# Patient Record
Sex: Female | Born: 1945 | Race: White | Hispanic: No | Marital: Married | Smoking: Never smoker
Health system: Southern US, Community
[De-identification: ages and names within clinical notes are randomized; demographics above are authoritative.]

## PROBLEM LIST (undated history)

## (undated) DIAGNOSIS — R945 Abnormal results of liver function studies: Principal | ICD-10-CM

## (undated) DIAGNOSIS — F329 Major depressive disorder, single episode, unspecified: Secondary | ICD-10-CM

## (undated) DIAGNOSIS — N200 Calculus of kidney: Secondary | ICD-10-CM

## (undated) DIAGNOSIS — M549 Dorsalgia, unspecified: Secondary | ICD-10-CM

## (undated) DIAGNOSIS — K219 Gastro-esophageal reflux disease without esophagitis: Secondary | ICD-10-CM

## (undated) DIAGNOSIS — E785 Hyperlipidemia, unspecified: Secondary | ICD-10-CM

## (undated) DIAGNOSIS — C9110 Chronic lymphocytic leukemia of B-cell type not having achieved remission: Secondary | ICD-10-CM

## (undated) DIAGNOSIS — F419 Anxiety disorder, unspecified: Secondary | ICD-10-CM

## (undated) DIAGNOSIS — G8929 Other chronic pain: Secondary | ICD-10-CM

## (undated) DIAGNOSIS — Z87898 Personal history of other specified conditions: Secondary | ICD-10-CM

## (undated) DIAGNOSIS — M797 Fibromyalgia: Secondary | ICD-10-CM

## (undated) DIAGNOSIS — R7989 Other specified abnormal findings of blood chemistry: Secondary | ICD-10-CM

## (undated) DIAGNOSIS — J189 Pneumonia, unspecified organism: Secondary | ICD-10-CM

## (undated) DIAGNOSIS — L659 Nonscarring hair loss, unspecified: Secondary | ICD-10-CM

## (undated) DIAGNOSIS — F32A Depression, unspecified: Secondary | ICD-10-CM

## (undated) DIAGNOSIS — M1990 Unspecified osteoarthritis, unspecified site: Secondary | ICD-10-CM

## (undated) DIAGNOSIS — H9191 Unspecified hearing loss, right ear: Secondary | ICD-10-CM

## (undated) DIAGNOSIS — C4490 Unspecified malignant neoplasm of skin, unspecified: Secondary | ICD-10-CM

## (undated) DIAGNOSIS — R000 Tachycardia, unspecified: Secondary | ICD-10-CM

## (undated) DIAGNOSIS — R319 Hematuria, unspecified: Secondary | ICD-10-CM

## (undated) DIAGNOSIS — R159 Full incontinence of feces: Secondary | ICD-10-CM

## (undated) DIAGNOSIS — M722 Plantar fascial fibromatosis: Secondary | ICD-10-CM

## (undated) DIAGNOSIS — K5790 Diverticulosis of intestine, part unspecified, without perforation or abscess without bleeding: Secondary | ICD-10-CM

## (undated) DIAGNOSIS — C50919 Malignant neoplasm of unspecified site of unspecified female breast: Secondary | ICD-10-CM

## (undated) DIAGNOSIS — Z9221 Personal history of antineoplastic chemotherapy: Secondary | ICD-10-CM

## (undated) DIAGNOSIS — M519 Unspecified thoracic, thoracolumbar and lumbosacral intervertebral disc disorder: Secondary | ICD-10-CM

## (undated) DIAGNOSIS — IMO0002 Reserved for concepts with insufficient information to code with codable children: Secondary | ICD-10-CM

## (undated) DIAGNOSIS — E039 Hypothyroidism, unspecified: Secondary | ICD-10-CM

## (undated) HISTORY — DX: Major depressive disorder, single episode, unspecified: F32.9

## (undated) HISTORY — DX: Fibromyalgia: M79.7

## (undated) HISTORY — PX: TONSILLECTOMY: SUR1361

## (undated) HISTORY — DX: Nonscarring hair loss, unspecified: L65.9

## (undated) HISTORY — DX: Hypothyroidism, unspecified: E03.9

## (undated) HISTORY — DX: Reserved for concepts with insufficient information to code with codable children: IMO0002

## (undated) HISTORY — DX: Hyperlipidemia, unspecified: E78.5

## (undated) HISTORY — DX: Abnormal results of liver function studies: R94.5

## (undated) HISTORY — DX: Plantar fascial fibromatosis: M72.2

## (undated) HISTORY — DX: Malignant neoplasm of unspecified site of unspecified female breast: C50.919

## (undated) HISTORY — DX: Tachycardia, unspecified: R00.0

## (undated) HISTORY — DX: Anxiety disorder, unspecified: F41.9

## (undated) HISTORY — DX: Other specified abnormal findings of blood chemistry: R79.89

## (undated) HISTORY — PX: LUMBAR EPIDURAL INJECTION: SHX1980

## (undated) HISTORY — DX: Unspecified osteoarthritis, unspecified site: M19.90

## (undated) HISTORY — DX: Diverticulosis of intestine, part unspecified, without perforation or abscess without bleeding: K57.90

## (undated) HISTORY — DX: Unspecified thoracic, thoracolumbar and lumbosacral intervertebral disc disorder: M51.9

## (undated) HISTORY — DX: Depression, unspecified: F32.A

---

## 1976-08-25 DIAGNOSIS — M797 Fibromyalgia: Secondary | ICD-10-CM

## 1976-08-25 HISTORY — DX: Fibromyalgia: M79.7

## 1995-08-26 HISTORY — PX: BREAST LUMPECTOMY: SHX2

## 1995-08-26 HISTORY — PX: APPENDECTOMY: SHX54

## 1995-08-26 HISTORY — PX: BREAST LUMPECTOMY WITH AXILLARY LYMPH NODE DISSECTION: SHX5756

## 1995-08-26 HISTORY — PX: TOTAL ABDOMINAL HYSTERECTOMY W/ BILATERAL SALPINGOOPHORECTOMY: SHX83

## 1997-12-07 ENCOUNTER — Encounter: Admission: RE | Admit: 1997-12-07 | Discharge: 1998-03-07 | Payer: Self-pay | Admitting: Oncology

## 2000-02-05 ENCOUNTER — Other Ambulatory Visit: Admission: RE | Admit: 2000-02-05 | Discharge: 2000-02-05 | Payer: Self-pay | Admitting: *Deleted

## 2001-10-14 ENCOUNTER — Encounter (INDEPENDENT_AMBULATORY_CARE_PROVIDER_SITE_OTHER): Payer: Self-pay | Admitting: Specialist

## 2001-10-14 ENCOUNTER — Ambulatory Visit (HOSPITAL_COMMUNITY): Admission: RE | Admit: 2001-10-14 | Discharge: 2001-10-14 | Payer: Self-pay | Admitting: *Deleted

## 2001-10-26 ENCOUNTER — Encounter (HOSPITAL_COMMUNITY): Admission: RE | Admit: 2001-10-26 | Discharge: 2001-11-25 | Payer: Self-pay | Admitting: Oncology

## 2001-10-26 ENCOUNTER — Encounter: Admission: RE | Admit: 2001-10-26 | Discharge: 2001-10-26 | Payer: Self-pay | Admitting: Oncology

## 2002-04-25 ENCOUNTER — Emergency Department (HOSPITAL_COMMUNITY): Admission: EM | Admit: 2002-04-25 | Discharge: 2002-04-25 | Payer: Self-pay | Admitting: Emergency Medicine

## 2002-04-25 ENCOUNTER — Encounter: Payer: Self-pay | Admitting: Emergency Medicine

## 2003-05-29 ENCOUNTER — Encounter (HOSPITAL_COMMUNITY): Admission: RE | Admit: 2003-05-29 | Discharge: 2003-06-28 | Payer: Self-pay | Admitting: Oncology

## 2003-05-29 ENCOUNTER — Encounter: Admission: RE | Admit: 2003-05-29 | Discharge: 2003-05-29 | Payer: Self-pay | Admitting: Oncology

## 2004-06-11 ENCOUNTER — Encounter (HOSPITAL_COMMUNITY): Admission: RE | Admit: 2004-06-11 | Discharge: 2004-07-11 | Payer: Self-pay | Admitting: Oncology

## 2004-06-11 ENCOUNTER — Encounter: Admission: RE | Admit: 2004-06-11 | Discharge: 2004-06-11 | Payer: Self-pay | Admitting: Oncology

## 2005-06-11 ENCOUNTER — Ambulatory Visit (HOSPITAL_COMMUNITY): Payer: Self-pay | Admitting: Oncology

## 2005-06-11 ENCOUNTER — Encounter: Admission: RE | Admit: 2005-06-11 | Discharge: 2005-06-11 | Payer: Self-pay | Admitting: Oncology

## 2005-06-11 ENCOUNTER — Encounter (HOSPITAL_COMMUNITY): Admission: RE | Admit: 2005-06-11 | Discharge: 2005-07-11 | Payer: Self-pay | Admitting: Oncology

## 2005-09-04 ENCOUNTER — Ambulatory Visit (HOSPITAL_COMMUNITY): Admission: RE | Admit: 2005-09-04 | Discharge: 2005-09-04 | Payer: Self-pay | Admitting: Family Medicine

## 2005-09-04 ENCOUNTER — Encounter (INDEPENDENT_AMBULATORY_CARE_PROVIDER_SITE_OTHER): Payer: Self-pay | Admitting: Cardiology

## 2005-09-23 ENCOUNTER — Ambulatory Visit (HOSPITAL_COMMUNITY): Admission: RE | Admit: 2005-09-23 | Discharge: 2005-09-23 | Payer: Self-pay | Admitting: Family Medicine

## 2006-06-04 ENCOUNTER — Ambulatory Visit (HOSPITAL_COMMUNITY): Admission: RE | Admit: 2006-06-04 | Discharge: 2006-06-04 | Payer: Self-pay | Admitting: *Deleted

## 2006-06-04 ENCOUNTER — Encounter (INDEPENDENT_AMBULATORY_CARE_PROVIDER_SITE_OTHER): Payer: Self-pay | Admitting: Specialist

## 2006-08-28 ENCOUNTER — Encounter (HOSPITAL_COMMUNITY): Admission: RE | Admit: 2006-08-28 | Discharge: 2006-09-27 | Payer: Self-pay | Admitting: Oncology

## 2006-08-28 ENCOUNTER — Ambulatory Visit (HOSPITAL_COMMUNITY): Payer: Self-pay | Admitting: Oncology

## 2007-04-29 ENCOUNTER — Ambulatory Visit: Payer: Self-pay | Admitting: Cardiology

## 2007-05-11 ENCOUNTER — Ambulatory Visit: Payer: Self-pay

## 2007-05-11 ENCOUNTER — Encounter: Payer: Self-pay | Admitting: Cardiology

## 2007-06-17 ENCOUNTER — Ambulatory Visit: Payer: Self-pay | Admitting: Cardiology

## 2007-08-03 ENCOUNTER — Ambulatory Visit: Payer: Self-pay | Admitting: Cardiology

## 2007-08-31 ENCOUNTER — Ambulatory Visit: Payer: Self-pay | Admitting: Internal Medicine

## 2007-08-31 ENCOUNTER — Ambulatory Visit (HOSPITAL_COMMUNITY): Admission: RE | Admit: 2007-08-31 | Discharge: 2007-08-31 | Payer: Self-pay | Admitting: Cardiology

## 2007-09-16 ENCOUNTER — Ambulatory Visit: Payer: Self-pay | Admitting: Cardiology

## 2007-09-22 ENCOUNTER — Ambulatory Visit (HOSPITAL_COMMUNITY): Payer: Self-pay | Admitting: Oncology

## 2007-09-22 ENCOUNTER — Encounter (HOSPITAL_COMMUNITY): Admission: RE | Admit: 2007-09-22 | Discharge: 2007-10-22 | Payer: Self-pay | Admitting: Oncology

## 2007-12-23 ENCOUNTER — Encounter (HOSPITAL_COMMUNITY): Admission: RE | Admit: 2007-12-23 | Discharge: 2008-01-22 | Payer: Self-pay | Admitting: Oncology

## 2007-12-23 ENCOUNTER — Ambulatory Visit (HOSPITAL_COMMUNITY): Payer: Self-pay | Admitting: Oncology

## 2008-01-13 ENCOUNTER — Encounter: Admission: RE | Admit: 2008-01-13 | Discharge: 2008-01-13 | Payer: Self-pay | Admitting: *Deleted

## 2008-09-20 ENCOUNTER — Ambulatory Visit (HOSPITAL_COMMUNITY): Payer: Self-pay | Admitting: Oncology

## 2008-10-19 ENCOUNTER — Encounter: Admission: RE | Admit: 2008-10-19 | Discharge: 2008-10-19 | Payer: Self-pay | Admitting: *Deleted

## 2009-10-15 ENCOUNTER — Ambulatory Visit (HOSPITAL_COMMUNITY): Payer: Self-pay | Admitting: Oncology

## 2009-10-15 ENCOUNTER — Encounter (HOSPITAL_COMMUNITY): Admission: RE | Admit: 2009-10-15 | Discharge: 2009-11-14 | Payer: Self-pay | Admitting: Oncology

## 2010-03-06 ENCOUNTER — Ambulatory Visit: Payer: Self-pay | Admitting: Cardiology

## 2010-03-19 ENCOUNTER — Encounter: Admission: RE | Admit: 2010-03-19 | Discharge: 2010-03-19 | Payer: Self-pay | Admitting: Neurology

## 2010-04-02 ENCOUNTER — Ambulatory Visit: Payer: Self-pay | Admitting: Psychology

## 2010-06-05 ENCOUNTER — Ambulatory Visit: Payer: Self-pay | Admitting: Psychology

## 2010-09-24 NOTE — Assessment & Plan Note (Signed)
Summary: F2Y  Medications Added PRISTIQ 100 MG XR24H-TAB (DESVENLAFAXINE SUCCINATE) once daily WELLBUTRIN 100 MG TABS (BUPROPION HCL) once daily AMITRIPTYLINE HCL 150 MG TABS (AMITRIPTYLINE HCL) at bedtime OMEPRAZOLE 20 MG CPDR (OMEPRAZOLE) once daily CALCIUM CARBONATE-VITAMIN D 600-400 MG-UNIT  TABS (CALCIUM CARBONATE-VITAMIN D) 2 tabs once daily      Allergies Added: ! LYRICA  Visit Type:  Follow-up Primary Provider:  Burgess Estelle, MD  CC:  chest pain.  History of Present Illness: patient is seen for followup her cardiac status.  She's had some orthostatic dizziness in the past.  This is stable.  She's also had some chest pain.  She's not had any significant recurrence.  There was an episode of syncope in 2008.  There's been no recurrence.  She had shortness of breath.  Ultimately she had a cardiopulmonary exercise test in January, 2009.  It was felt that her overall sensation of dyspnea was probably due to obesity and deconditioning.  There were no definite cardiopulmonary limitations.  Weight loss will be very important.  She didn't lose weight last summer but unfortunately she has gained most of it back.  Current Medications (verified): 1)  Metoprolol Succinate 25 Mg Xr24h-Tab (Metoprolol Succinate) .... Take One Tablet By Mouth Daily 2)  Pristiq 100 Mg Xr24h-Tab (Desvenlafaxine Succinate) .... Once Daily 3)  Wellbutrin 100 Mg Tabs (Bupropion Hcl) .... Once Daily 4)  Amitriptyline Hcl 150 Mg Tabs (Amitriptyline Hcl) .... At Bedtime 5)  Omeprazole 20 Mg Cpdr (Omeprazole) .... Once Daily 6)  Calcium Carbonate-Vitamin D 600-400 Mg-Unit  Tabs (Calcium Carbonate-Vitamin D) .... 2 Tabs Once Daily  Allergies (verified): 1)  ! Lyrica  Past History:  Past Medical History: SOB.....evaluation included CPX.... shortness of breath from obesity and deconditioning 2009 Syncope.... 2008... no recurrence Breast cancer.... status post lumpectomies Sinus tachycardia... mild resting EF  65% Impression Chest Pain...no proven coronary disease Hight Triglycirdes Orthostatic hypotension.... slight in the past  S/P  axillary node dissection S/P  Abdominal Hysterectomy-Total S/P  Lupectomies  Review of Systems       Patient denies fever, chills, headache, sweats, rash, change in vision, change in hearing, chest pain, cough, nausea vomiting, urinary symptoms.  All of the systems are reviewed and are negative.  Vital Signs:  Patient profile:   65 year old female Height:      64 inches Weight:      188 pounds BMI:     32.39 Pulse rate:   92 / minute BP sitting:   104 / 64  (left arm) Cuff size:   regular  Vitals Entered By: Hardin Negus, RMA (March 06, 2010 2:51 PM)  Physical Exam  General:  patient is stable. Eyes:  no xanthelasma. Neck:  no jugular venous distention. Lungs:  lungs are clear.  Respiratory effort is nonlabored. Heart:    Cardiac exam reveals S1-S2.  No clicks or significant murmurs. Abdomen:  abdomen is soft. Extremities:  no peripheral edema. Psych:  patient is oriented to person time and place affect is normal.   Impression & Recommendations:  Problem # 1:  DYSPNEA (ICD-786.05)  Her updated medication list for this problem includes:    Metoprolol Succinate 25 Mg Xr24h-tab (Metoprolol succinate) .Marland Kitchen... Take one tablet by mouth daily The patient has chronic dyspnea.  It is stable.  We know that she needs to exercise and lose weight .  Problem # 2:  CHEST PAIN-UNSPECIFIED (ICD-786.50)  Her updated medication list for this problem includes:    Metoprolol Succinate 25 Mg  Xr24h-tab (Metoprolol succinate) .Marland Kitchen... Take one tablet by mouth daily She has not had significant chest pain.  EKG is done today.  There no significant changes.  Resting heart rate is 92.  No changes.  Problem # 3:  ORTHOSTATIC HYPOTENSION (ICD-458.0) She's not had any orthostatic hypotension.  Patient Instructions: 1)  Your physician wants you to follow-up in:  2 years.   You will receive a reminder letter in the mail two months in advance. If you don't receive a letter, please call our office to schedule the follow-up appointment.

## 2010-10-14 ENCOUNTER — Ambulatory Visit (HOSPITAL_COMMUNITY): Payer: Self-pay | Admitting: Oncology

## 2010-10-16 ENCOUNTER — Ambulatory Visit (HOSPITAL_BASED_OUTPATIENT_CLINIC_OR_DEPARTMENT_OTHER): Payer: Medicare Other | Admitting: Oncology

## 2010-10-16 ENCOUNTER — Encounter (HOSPITAL_COMMUNITY): Payer: Medicare Other | Attending: Oncology

## 2010-10-16 DIAGNOSIS — F3289 Other specified depressive episodes: Secondary | ICD-10-CM | POA: Insufficient documentation

## 2010-10-16 DIAGNOSIS — Z09 Encounter for follow-up examination after completed treatment for conditions other than malignant neoplasm: Secondary | ICD-10-CM | POA: Insufficient documentation

## 2010-10-16 DIAGNOSIS — C50919 Malignant neoplasm of unspecified site of unspecified female breast: Secondary | ICD-10-CM

## 2010-10-16 DIAGNOSIS — F329 Major depressive disorder, single episode, unspecified: Secondary | ICD-10-CM | POA: Insufficient documentation

## 2010-10-16 DIAGNOSIS — Z853 Personal history of malignant neoplasm of breast: Secondary | ICD-10-CM | POA: Insufficient documentation

## 2010-11-13 LAB — DIFFERENTIAL
Basophils Absolute: 0 10*3/uL (ref 0.0–0.1)
Lymphocytes Relative: 43 % (ref 12–46)
Lymphs Abs: 5.9 10*3/uL — ABNORMAL HIGH (ref 0.7–4.0)
Neutro Abs: 6.8 10*3/uL (ref 1.7–7.7)
Neutrophils Relative %: 49 % (ref 43–77)

## 2010-11-13 LAB — CBC
Platelets: 220 10*3/uL (ref 150–400)
RDW: 13.3 % (ref 11.5–15.5)
WBC: 13.8 10*3/uL — ABNORMAL HIGH (ref 4.0–10.5)

## 2011-01-07 NOTE — Assessment & Plan Note (Signed)
[facility]                            CARDIOLOGY OFFICE NOTE   NAME:Vanleer, ASHAYLA SUBIA                     MRN:          [id number]  DATE:06/17/2007                            DOB:          December 09, 1945    Ms. Delbene is doing well.  I had seen her on April 29, 2007.  At  that time we did a 2D echo.  The ejection fraction was 65% and there  were no significant abnormalities.  We did do orthostatic blood  pressures.  She had a mild drop from 130 systolic lying to 112 standing.  She had no significant dizziness during this period.  She also wore and  event recorder.  She did have sinus tachycardia.  Her rates would range  at various times from 100 to as high as 130.  She tends to have a high  resting heart rate overall.  Today, in the office she is feeling well.  She feels warm all of the  time.  Otherwise, she is stable.   PAST MEDICAL HISTORY:   ALLERGIES:  LYRICA.   MEDICATIONS:  Cymbalta, Wellbutrin, amitriptyline, and vitamins.   OTHER MEDICAL PROBLEMS:  See the list on my note of April 29, 2007.   REVIEW OF SYSTEMS:  See the HPI.   PHYSICAL EXAMINATION:  NEUROLOGIC:  The patient is oriented to person,  time, and place.  Affect is normal.  She is fanning herself in the room  today.  It may be slightly on the warm side but not particularly warm at  the time.  HEENT:  Reveals no xanthelasma. She has normal extraocular motion.  NECK:  There are no carotid bruits.  There is no jugular venous  distention.  LUNGS:  Clear.  Respiratory effort is not labored.  CARDIAC:  Reveals an S1 with an S2.  There are no clicks or significant  murmurs.  ABDOMEN:  Soft.  There are no masses or bruits.  EXTREMITIES:  She has no peripheral edema.   No labs are done today.  See the discussion in the HPI.   PROBLEMS:  Are listed in my note of April 29, 2007.  #1.  History of some orthostatic dizziness by history.  She does have  mild orthostasis at  this time.  I will not make any changes in any of  her medications.  #6.  Syncopal episode in the past 2 weeks.  No other workup at this  time.  #11.  Slight increase in resting heart rate.  I believe that a low dose  of beta-blockade may help her.  We have started 25 mg extended release  metoprolol and we will see her back for followup.     Luis Abed, MD, [facility]  Electronically Signed    JDK/MedQ  DD: 06/17/2007  DT: 06/18/2007  Job #: [id number]   cc:   Broadus John T. Pamalee Leyden, MD

## 2011-01-07 NOTE — Assessment & Plan Note (Signed)
[facility]                            CARDIOLOGY OFFICE NOTE   NAME:[name], [name] LATHON                     MRN:          [id number]  DATE:04/29/2007                            DOB:          06/17/1946    [name] [name] is self-referred for the evaluation of chest pain and  syncope.  She is followed at [facility].  Recently,  over the weekend, she was seen at Urgent Care on [address].  I take  care of the patient's husband and she is here for cardiac evaluation.  When she was seen at the urgent care center her EKG revealed sinus  arrhythmia and she is following up with cardiac evaluation.  It turns  out also that in the past week or two the patient had a spell of  syncope.  She stood up at home and passed out.  She did bump her head.  Exact etiology is not clear.  She is having significant symptoms of  menopause.  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  She has had a hysterectomy and cannot take hormone replacement.  She is  using a hand fan in the room at this time and is very bothered by her  symptoms of intermittent sweats.   She also has had some chest tightness and some shortness of breath.  They do not sound like classic anginal symptoms.   The patient also mentioned some mild chest discomfort in her left chest.  It actually occurs at night.  There is no radiation.  There is no  nausea, vomiting or diaphoresis.  It only lasts for a few moments.   PAST MEDICAL HISTORY:  Allergies:  LYRICA.   Medications:  Cymbalta, Wellbutrin, amitriptyline, calcium, magnesium  and Prilosec OTC.   Other medical problems:  See list below.   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  She  does not smoke.   FAMILY HISTORY:  There is no strong family history of coronary disease.   REVIEW OF SYSTEMS:  She has some seasonal allergies, constipation and  fatigue.  She also has GERD and arthritic  pains.  She is treated for  anxiety and depression.  She does have a Veterinary surgeon.  Otherwise, her  review of systems is negative.   PHYSICAL EXAMINATION:  Her weight is 189 pounds.  Blood pressure sitting  is 134/76 with a pulse of 95.  Orthostatic blood pressures are being  done at this time and will be re-reviewed.  The patient is oriented to  person, time and place.  Affect is normal.  She is somewhat  uncomfortable with a hot flash at this time.  HEENT:  Reveals no xanthelasma.  She has normal extraocular motion.  There are no carotid bruits.  There is no jugular venous distention.  CARDIAC:  Reveals an S1 with an S2.  There are no clicks or significant  murmurs.  LUNGS:  Clear.  There is no respiratory distress.  ABDOMEN:  Soft.  There are no masses or bruits.  She has normal bowel  sounds.  There  is no peripheral edema.  She has 2+ distal pulses.   EKG reveals that she has sinus rhythm and a normal EKG.   Labs that had been done showed that the patient has a normal TSH dated  June 2008.  She has elevated triglyceride.   PROBLEMS:  1. History of some orthostatic dizziness by history.  Formal      orthostatic pressures are being checked at this time.  2. Significant symptoms from menopause.  3. History of allergy to [facility].  4. Left chest pain.  At this point I am not convinced this represents      ischemia.  5. Mild intermittent shortness of breath.  6. Syncopal episode in the past 2 weeks.  Exact etiology is not clear.      She will wear an event recorder.  7. Elevated triglycerides by history.  8. History of breast cancer in the past.  9. Status post lumpectomies and an axillary node dissection.  10.Status post total abdominal hysterectomy.   The patient will have a 2-D echocardiogram and she will wear an event  recorder, and I will then see her back for followup.     Luis Abed, MD, [facility]  Electronically Signed    JDK/MedQ  DD: 04/29/2007  DT: 04/29/2007  Job  #: [id number]   cc:   Broadus John T. Pamalee Leyden, MD

## 2011-01-07 NOTE — Assessment & Plan Note (Signed)
[facility]                            CARDIOLOGY OFFICE NOTE   NAME:[name], [name] ISTRE                     MRN:          [id number]  DATE:09/16/2007                            DOB:          05-19-1946    [name] [name] is seen for cardiology follow-up.  See my complete note of  August 03, 2007.  At that time we decided that we would proceed with a  cardiopulmonary exercise test to assess her overall capacity.  This was  done with a very extensive evaluation on August 31, 2007.  The report is  finalized by Dr. Gala Romney.  She has normal functional capacity when  compared to matched sedentary norms.  There is a ventilatory limitation  at peak exercise that is compatible with her body habitus.  She does  have an elevated heart rate response at low level of exercise that is  likely related to deconditioning.  Her perceived dyspnea in general is  likely due to her obesity and deconditioning.  There are no definite  cardiopulmonary limitations.  She is strongly encouraged to begin a  weight loss and exercise program.   Today in the office I explained all of the information to her.  She  seems pleased.  She does want to proceed with an exercise diet program.  She is not having any marked chest pain.   ALLERGIES:  LYRICA.   MEDICATIONS:  Cymbalta, Wellbutrin, amitriptyline, vitamins, Prilosec,  and metoprolol ER 25 mg daily.   OTHER MEDICAL PROBLEMS:  See the list on the note of August 03, 2007.   REVIEW OF SYSTEMS:  Overall she is doing well.  She has some mild chest  and neck discomfort.  I believe that this is not ischemic in origin.  Otherwise, Review of Systems is negative.   PHYSICAL EXAMINATION:  VITAL SIGNS:  Weight is up to 210 pounds, blood  pressure 118/80 with pulse 87.  GENERAL:  The patient is oriented to person, time, and place.  Affect is  normal.  HEENT:  Reveals no xanthelasma.  She has normal extraocular motion.  NECK:  There are no  carotid bruits.  There is no jugular venous  distention.  LUNGS:  Clear.  Respiratory effort is not labored.  CARDIAC:  Reveals S1 and S2.  There are no clicks or significant  murmurs.  ABDOMEN:  Soft.  She has no peripheral edema.   LABORATORY DATA:  No labs are done today.  See the complete  cardiopulmonary exercise test dated August 31, 2007.   IMPRESSION:  Problems are listed on the note of August 03, 2007.  #5. Intermittent shortness of breath.  See the results of the CPX.  I  believe this is related to her body habitus and deconditioning.  She is  stable and encouraged to exercise.  #10. Mild resting tachycardia that is better with beta blockade.  I  would keep her on a low dose of beta blocker.   No further cardiac workup is needed at this time.     Luis Abed, MD, [facility]  Electronically Signed    JDK/MedQ  DD: 09/16/2007  DT: 09/16/2007  Job #: [id number]   cc:   Broadus John T. Pamalee Leyden, MD

## 2011-01-07 NOTE — Assessment & Plan Note (Signed)
[facility]                            CARDIOLOGY OFFICE NOTE   NAME:Boddy, BRYNLI OLLIS                     MRN:          [id number]  DATE:08/03/2007                            DOB:          10/12/1945    [name] [name] is seen for a cardiology followup.  I saw her last on  June 17, 2007.  At that time, she was doing well.  I had seen her in  September of 2008.  She had a 2D echocardiogram.  Her ejection fraction  was normal.  She had a mild orthostatic blood pressure drop.  She wore  an event recorder and had some increased resting heart rate.  On June 17, 2007 I added low-dose metoprolol.  She has not had any recurring  chest discomfort and she has not had any recurring syncope.  However,  the patient today says that she has significant exertional shortness of  breath.  Today, she tells me that in fact this has been going on for a  prolonged period of time.  She does not have PND or orthopnea.  She does  not have any pedal edema.   PAST MEDICAL HISTORY:  Allergies:  Lyrica.  Medications:  Cymbalta, Wellbutrin, amitriptyline, Prilosec, Abilify,  and Metoprolol ER 25 daily.  Other medical problems:  See the list below.   REVIEW OF SYSTEMS:  Today she mentioned that she has some back pain.  She also has restlessness.  Otherwise her review of systems is negative.   PHYSICAL EXAMINATION:  Weight is 205 pounds.  Her weight is increasing.  She is encouraged by me to try to begin to lose weight.  Blood pressure  is 137/82 and her pulse is 90.  The patient is oriented to person, time  and place.  Aspect reveals that she does appear somewhat worried about  her overall status.  HEENT:  No xanthelasmas.  She has normal extraocular motion.  NECK:  There are no carotid bruits.  There is no jugular venous  distention.  LUNGS:  Clear.  Respiratory effort is not labored.  CARDIAC:  Reveals an S1, S2.  There are no clicks or significant  murmurs.  ABDOMEN:   Soft.  She has no peripheral edema.   PROBLEMS INCLUDE:  1. History of orthostatic dizziness by history and slight orthostatic      change when seen earlier this year but no change needs to be made      in her medicines.  2. Symptoms from menopause.  3. History of allergies to [facility].  4. Slight chest pain.  She has not had a recurrence of this and I      doubt ischemia.  5. Intermittent shortness of breath.  This appears to be a bigger      problem than I noted in the past.  We talked at length about the      possibility of various types of exercise tests.  I believe that a      cardiopulmonary exercise (CPX) will be the best to see if we cannot      get a  handle on what her limitations are.  It is possible that she      may have to pedal the bicycle.  It will have to be determined at      the time of the study how well she can walk.  6. History of a syncopal episode is August of 2008 with no recurrence.  7. History of elevated triglycerides.  8. History of breast cancer in the past with status post lumpectomies      and axillary node dissection.  9. Status post total abdominal hysterectomy.  10.Mild resting sinus tachycardia which is better with beta blockade.  11.Normal LV function with an ejection fraction of 65%.  12.Depression with medications on board.   We will arrange for a CPX and then I will see her in followup.     Luis Abed, MD, [facility]  Electronically Signed    JDK/MedQ  DD: 08/03/2007  DT: 08/03/2007  Job #: [id number]   cc:   Broadus John T. Pamalee Leyden, MD

## 2011-01-10 NOTE — Op Note (Signed)
NAME:  [name] [name], [name] [name] NO.:  [id number]   MEDICAL RECORD NO.:  [id number]          PATIENT TYPE:  AMB   LOCATION:  ENDO                         FACILITY:  [facility]   PHYSICIAN:  Georgiana Spinner, M.D.    DATE OF BIRTH:  Jan 27, 1946   DATE OF PROCEDURE:  DATE OF DISCHARGE:                                 OPERATIVE REPORT   PROCEDURE:  Upper endoscopy.   INDICATIONS:  GERD.   ANESTHESIA:  Demerol 60, Versed 6 mg.   PROCEDURE:  With the patient mildly sedated in the left lateral decubitus  position, the Olympus videoscopic endoscope was inserted and passed under  direct vision through the esophagus which appeared normal.  There was no  evidence of Barrett's esophagus.  We entered into the stomach fundus, body,  antrum, duodenal bulb, second portion of duodenum were visualized.  From  this point the endoscope was slowly withdrawn taking circumferential views  of the duodenal mucosa until the endoscope had been pulled back into the  stomach, placed in retroflexion to view the stomach from below.  The  endoscope was straightened and withdrawn taking circumferential views of the  remaining gastric and esophageal mucosa.  The patient's vital signs and  pulse oximeter remained stable.  The patient tolerated the procedure well  without apparent complications.   FINDINGS:  Rather unremarkable examination.   PLAN:  Proceed to colonoscopy.           ______________________________  Georgiana Spinner, M.D.     GMO/MEDQ  D:  06/04/2006  T:  06/05/2006  Job:  [id number]

## 2011-01-10 NOTE — Op Note (Signed)
NAME:  [name] [name], [name] [name] NO.:  [id number]   MEDICAL RECORD NO.:  [id number]          PATIENT TYPE:  AMB   LOCATION:  ENDO                         FACILITY:  [facility]   PHYSICIAN:  Georgiana Spinner, M.D.    DATE OF BIRTH:  Aug 28, 1945   DATE OF PROCEDURE:  DATE OF DISCHARGE:                                 OPERATIVE REPORT   PROCEDURE:  Colonoscopy.   INDICATIONS:  Diarrhea, rectal bleeding.   ANESTHESIA:  Demerol 20, Versed 2 mg.   PROCEDURE:  With the patient in the left lateral decubitus position before  sedation was given, a rectal examination was performed and she had  moderately decreased rectal tone.  Subsequently, the Olympus videoscopic  colonoscope was inserted into the rectum, passed under direct vision to the  cecum identified by ileocecal valve and appendiceal orifice.  From this  point the colonoscope was slowly withdrawn taking circumferential views of  the colonic mucosa, stopping to take random biopsies from normal-appearing  mucosa as we withdrew all the way to the rectum, which appeared normal on  direct and retroflexed view.  The endoscope was straightened and withdrawn.  The patient's vital signs and pulse oximeter remained stable.  The patient  tolerated the procedure well without apparent complication.   FINDINGS:  Unremarkable examination with biopsies taken.   PLAN:  Await biopsy report.  The patient will call me for results and follow  up with me as an outpatient.           ______________________________  Georgiana Spinner, M.D.     GMO/MEDQ  D:  06/04/2006  T:  06/05/2006  Job:  [id number]

## 2011-01-10 NOTE — Procedures (Signed)
[facility]  Patient:    [name] [name], [name] [name] Visit Number: [id number] MRN: [id number]          Service Type: Attending:  Sabino Gasser, M.D. Dictated by:   Sabino Gasser, M.D. Proc. Date: 10/14/01                             Procedure Report  PROCEDURE:  Upper endoscopy.  INDICATIONS:  GERD.  ANESTHESIA:  Demerol 80 mg, Versed 8 mg.  DESCRIPTION OF PROCEDURE:  With the patient mildly sedated in the left lateral decubitus position, the Olympus videoscopic endoscope was inserted in the mouth and passed under direct vision through the esophagus. The distal esophagus was approached and showed changes of esophagitis with an ulcer. The tissue was fairly friable and it bled with just passage of the scope. We photographed and biopsied this area. We entered into the stomach. Fundus, body, antrum, duodenal bulb, and second portion of the duodenum all appeared normal. From this point, the endoscope was slowly withdrawn taking circumferential views of the entire duodenal mucosa until the endoscope then pulled back into the stomach, placed in retroflexion to view the stomach from below. The endoscope was then straightened and withdrawn taking circumferential views of the remaining gastric and esophageal mucosa. The patients vital signs and pulse oximeter remained stable. The patient tolerated the procedure well without apparent complications.  FINDINGS:  Changes of esophagitis, biopsied.  PLAN:  Will place the patient on proton pump inhibitor therapy, if she is not already on it. Proceed to colonoscopy as planned. Dictated by:   Sabino Gasser, M.D. Attending:  Sabino Gasser, M.D. DD:  10/14/01 TD:  10/14/01 Job: [id number]

## 2011-01-10 NOTE — Procedures (Signed)
[facility]  Patient:    [name] [name], [name] [name] Visit Number: [id number] MRN: [id number]          Service Type: Attending:  Sabino Gasser, M.D. Dictated by:   Sabino Gasser, M.D. Proc. Date: 10/14/01                             Procedure Report  PROCEDURE:  Colonoscopy.  INDICATIONS:  Colon cancer screening.  ANESTHESIA:  Demerol 20 mg, Versed 2 mg.  DESCRIPTION OF PROCEDURE:  With the patient mildly sedated in the left lateral decubitus position, the Olympus videoscopic colonoscope was inserted in the rectum and passed under direct vision with pressure applied to the abdomen to the cecum, identified by the ileocecal valve and appendiceal orifice. The prep was slightly suboptimal in that there was liquid fecal material scattered throughout the colon, especially in the right colon. After clearing the cecum with washing and suctioning, we entered into the terminal ileum which was normal appearing. It, too, was full of prep and fecal material. The endoscope was then slowly withdrawn taking circumferential views of the remaining colonic mucosa, stopping to clean the colon as we went until we reached the rectum, which appeared normal in direct and showed small hemorrhoids in retroflex view. The endoscope was straightened and withdrawn. The patients vital signs and pulse oximeter remained stable. The patient tolerated the procedure well without apparent complications.  FINDINGS:  Internal hemorrhoids, otherwise unremarkable colonoscopic examination, limited somewhat by prep, but no gross lesions seen. Dictated by:   Sabino Gasser, M.D. Attending:  Sabino Gasser, M.D. DD:  10/14/01 TD:  10/14/01 Job: [id number]

## 2011-01-18 ENCOUNTER — Emergency Department (HOSPITAL_COMMUNITY)
Admission: EM | Admit: 2011-01-18 | Discharge: 2011-01-18 | Disposition: A | Discharge status: Home / Self Care | Payer: Medicare Other | Attending: Emergency Medicine | Admitting: Emergency Medicine

## 2011-01-18 ENCOUNTER — Emergency Department (HOSPITAL_COMMUNITY): Payer: Medicare Other

## 2011-01-18 DIAGNOSIS — F3289 Other specified depressive episodes: Secondary | ICD-10-CM | POA: Insufficient documentation

## 2011-01-18 DIAGNOSIS — K219 Gastro-esophageal reflux disease without esophagitis: Secondary | ICD-10-CM | POA: Insufficient documentation

## 2011-01-18 DIAGNOSIS — Z79899 Other long term (current) drug therapy: Secondary | ICD-10-CM | POA: Insufficient documentation

## 2011-01-18 DIAGNOSIS — R0602 Shortness of breath: Secondary | ICD-10-CM | POA: Insufficient documentation

## 2011-01-18 DIAGNOSIS — R0989 Other specified symptoms and signs involving the circulatory and respiratory systems: Secondary | ICD-10-CM | POA: Insufficient documentation

## 2011-01-18 DIAGNOSIS — Z853 Personal history of malignant neoplasm of breast: Secondary | ICD-10-CM | POA: Insufficient documentation

## 2011-01-18 DIAGNOSIS — F329 Major depressive disorder, single episode, unspecified: Secondary | ICD-10-CM | POA: Insufficient documentation

## 2011-01-18 DIAGNOSIS — R0609 Other forms of dyspnea: Secondary | ICD-10-CM | POA: Insufficient documentation

## 2011-01-18 DIAGNOSIS — R079 Chest pain, unspecified: Secondary | ICD-10-CM | POA: Insufficient documentation

## 2011-01-18 DIAGNOSIS — M129 Arthropathy, unspecified: Secondary | ICD-10-CM | POA: Insufficient documentation

## 2011-01-18 LAB — COMPREHENSIVE METABOLIC PANEL
Albumin: 4.2 g/dL (ref 3.5–5.2)
Alkaline Phosphatase: 56 U/L (ref 39–117)
BUN: 12 mg/dL (ref 6–23)
Creatinine, Ser: 0.87 mg/dL (ref 0.4–1.2)
Potassium: 4.2 mEq/L (ref 3.5–5.1)
Total Protein: 7 g/dL (ref 6.0–8.3)

## 2011-01-18 LAB — URINALYSIS, ROUTINE W REFLEX MICROSCOPIC
Bilirubin Urine: NEGATIVE
Glucose, UA: NEGATIVE mg/dL
Hgb urine dipstick: NEGATIVE
Specific Gravity, Urine: 1.011 (ref 1.005–1.030)
Urobilinogen, UA: 0.2 mg/dL (ref 0.0–1.0)

## 2011-01-18 LAB — CBC
HCT: 38.1 % (ref 36.0–46.0)
MCV: 86.4 fL (ref 78.0–100.0)
RDW: 13.6 % (ref 11.5–15.5)
WBC: 15.6 10*3/uL — ABNORMAL HIGH (ref 4.0–10.5)

## 2011-01-18 LAB — URINE MICROSCOPIC-ADD ON

## 2011-01-18 LAB — DIFFERENTIAL
Basophils Relative: 0 % (ref 0–1)
Eosinophils Relative: 0 % (ref 0–5)
Lymphs Abs: 9.2 10*3/uL — ABNORMAL HIGH (ref 0.7–4.0)
Monocytes Absolute: 1.1 10*3/uL — ABNORMAL HIGH (ref 0.1–1.0)
Neutrophils Relative %: 34 % — ABNORMAL LOW (ref 43–77)

## 2011-01-18 LAB — TROPONIN I: Troponin I: <0.3 ng/mL (ref <0.30)

## 2011-01-18 LAB — CK TOTAL AND CKMB (NOT AT ARMC)
CK, MB: 1.7 ng/mL (ref 0.3–4.0)
Relative Index: 1.3 (ref 0.0–2.5)
Total CK: 129 U/L (ref 7–177)

## 2011-01-19 LAB — URINE CULTURE

## 2011-03-16 ENCOUNTER — Other Ambulatory Visit: Payer: Self-pay | Admitting: Cardiology

## 2011-05-15 LAB — COMPREHENSIVE METABOLIC PANEL
BUN: 11
CO2: 28
Chloride: 100
Creatinine, Ser: 0.89
GFR calc non Af Amer: >60
Total Bilirubin: 0.5

## 2011-05-15 LAB — CBC
HCT: 39.7
Hemoglobin: 13.7
MCV: 85.6
RBC: 4.64
WBC: 8.9

## 2011-05-20 LAB — COMPREHENSIVE METABOLIC PANEL
ALT: 78 — ABNORMAL HIGH
Albumin: 4.1
Alkaline Phosphatase: 81
BUN: 11
Calcium: 9.3
Potassium: 3.9
Sodium: 137
Total Protein: 6.8

## 2011-08-26 HISTORY — PX: RETINAL DETACHMENT SURGERY: SHX105

## 2011-10-15 ENCOUNTER — Ambulatory Visit (HOSPITAL_COMMUNITY): Payer: Medicare Other | Admitting: Oncology

## 2011-10-24 ENCOUNTER — Ambulatory Visit (HOSPITAL_COMMUNITY): Payer: Medicare Other | Admitting: Oncology

## 2011-11-02 ENCOUNTER — Ambulatory Visit (INDEPENDENT_AMBULATORY_CARE_PROVIDER_SITE_OTHER): Payer: Medicare Other | Admitting: Family Medicine

## 2011-11-02 VITALS — BP 132/84 | HR 114 | Temp 99.3°F | Resp 18 | Ht 64.0 in | Wt 192.2 lb

## 2011-11-02 DIAGNOSIS — J329 Chronic sinusitis, unspecified: Secondary | ICD-10-CM

## 2011-11-02 NOTE — Progress Notes (Signed)
66 yo woman with 1 week of sinus congestion and 1 day of cough.  The symptoms are worsening she comes in for evaluation. She has a low-grade temperature today before today has had no significant temperature. She has no shortness of breath, GI symptoms, GU symptoms, headache, or stiff neck.  Objective: Adult woman in no acute distress  HEENT unremarkable except for mucopurulent discharge from the nose  Chest: Few rhonchi otherwise negative  Heart: Regular, no murmur  Assessment: Sinus congestion and bronchitis  Plan: Hydromet and Z-Pak.

## 2011-12-18 ENCOUNTER — Other Ambulatory Visit: Payer: Self-pay | Admitting: Family Medicine

## 2012-02-04 ENCOUNTER — Encounter: Payer: Self-pay | Admitting: Gastroenterology

## 2012-02-18 ENCOUNTER — Other Ambulatory Visit: Payer: Self-pay | Admitting: Neurology

## 2012-02-18 DIAGNOSIS — R51 Headache: Secondary | ICD-10-CM

## 2012-02-18 DIAGNOSIS — H9190 Unspecified hearing loss, unspecified ear: Secondary | ICD-10-CM

## 2012-02-18 DIAGNOSIS — H9319 Tinnitus, unspecified ear: Secondary | ICD-10-CM

## 2012-02-20 ENCOUNTER — Encounter: Payer: Self-pay | Admitting: *Deleted

## 2012-02-25 ENCOUNTER — Ambulatory Visit
Admission: RE | Admit: 2012-02-25 | Discharge: 2012-02-25 | Disposition: A | Discharge status: Home / Self Care | Payer: Medicare Other | Source: Ambulatory Visit | Attending: Neurology | Admitting: Neurology

## 2012-02-25 DIAGNOSIS — H9319 Tinnitus, unspecified ear: Secondary | ICD-10-CM

## 2012-02-25 DIAGNOSIS — H9190 Unspecified hearing loss, unspecified ear: Secondary | ICD-10-CM

## 2012-02-25 DIAGNOSIS — R51 Headache: Secondary | ICD-10-CM

## 2012-02-25 MED ORDER — GADOBENATE DIMEGLUMINE 529 MG/ML IV SOLN
15.0000 mL | Freq: Once | INTRAVENOUS | Status: AC | PRN
  Administered 2012-02-25: 15 mL via INTRAVENOUS

## 2012-02-29 ENCOUNTER — Other Ambulatory Visit: Payer: Self-pay | Admitting: Cardiology

## 2012-03-01 ENCOUNTER — Ambulatory Visit: Payer: Medicare Other | Admitting: Cardiology

## 2012-03-01 ENCOUNTER — Telehealth: Payer: Self-pay | Admitting: Gastroenterology

## 2012-03-01 NOTE — Telephone Encounter (Signed)
Refilled metoprolol 

## 2012-03-01 NOTE — Telephone Encounter (Signed)
Forward 21 pages to Dr. Rob Bunting for review on 03-01-12 ym

## 2012-03-03 ENCOUNTER — Ambulatory Visit (INDEPENDENT_AMBULATORY_CARE_PROVIDER_SITE_OTHER): Payer: Medicare Other | Admitting: Gastroenterology

## 2012-03-03 ENCOUNTER — Encounter: Payer: Self-pay | Admitting: Gastroenterology

## 2012-03-03 VITALS — BP 110/76 | HR 84 | Ht 64.0 in | Wt 189.1 lb

## 2012-03-03 DIAGNOSIS — R198 Other specified symptoms and signs involving the digestive system and abdomen: Secondary | ICD-10-CM

## 2012-03-03 DIAGNOSIS — R194 Change in bowel habit: Secondary | ICD-10-CM

## 2012-03-03 MED ORDER — HYOSCYAMINE SULFATE ER 0.375 MG PO TB12: 0.3750 mg | ORAL_TABLET | Freq: Two times a day (BID) | ORAL | Status: DC | PRN

## 2012-03-03 NOTE — Progress Notes (Signed)
HPI: This is a   very pleasant 66 year old woman whom I am meeting for the first time today. She was a previous long term patient of Dr. Glori Luis he had perform colonoscopy for her in 2007 the found no polyps. The exam was normal. This was done for diarrhea and rectal bleeding. Biopsies showed no microscopic colitis. He also perform upper endoscopy at the same time and this was a "rather unremarkable examination". He had perform colonoscopy for her February 2003 done for colon cancer screening. No polyps were found. He had also perform upper endoscopy February 2003 for her and that showed some mild esophagitis. He has decided that she had mild fatty liver based on slightly elevated liver tests as well as fatty appearing liver on ultrasound. As well as CT scan.   Today she is here discussing her chronic alternating constipation. She also has severe lower abdominal cramping about twice a week. She will intermittently have loose stools as well. She takes 2-3 hydrocodone pills per day as well as twice daily naproxen and sometimes some other over-the-counter NSAIDs. She is on omeprazole once daily.    One of her medicines, viibrid has a known side effect of diarrhea and #2 side effect of nausea.  Review of systems: Pertinent positive and negative review of systems were noted in the above HPI section. Complete review of systems was performed and was otherwise normal.    Past Medical History  Diagnosis Date  . SOB (shortness of breath)     SOB from obesity and deconditioning 2009- eval included CPX  . Syncope 2008    no recurrence  . Breast cancer 1997    s/p lumpectomies  . Sinus tachycardia     mild resting  . Chest pain     no proven coronary disease  . HLD (hyperlipidemia)   . Orthostatic hypotension     slight in the past  . Anxiety   . Arthritis   . Depression   . Diverticulosis   . Fibromyalgia 1978  . Hypothyroidism     Past Surgical History  Procedure Date  . Total abdominal  hysterectomy   . Axillary node dissection     right x 2  . Breast lumpectomy     right  . Tonsillectomy   . Refractive surgery     right    Current Outpatient Prescriptions  Medication Sig Dispense Refill  . amitriptyline (ELAVIL) 150 MG tablet Take 150 mg by mouth at bedtime.      Marland Kitchen buPROPion (ZYBAN) 150 MG 12 hr tablet Take 150 mg by mouth 3 (three) times daily.      . cetirizine (ZYRTEC) 10 MG tablet TAKE ONE TABLET BY MOUTH EVERY DAY  30 tablet  4  . levothyroxine (SYNTHROID, LEVOTHROID) 50 MCG tablet Take 50 mcg by mouth daily.      . metoprolol succinate (TOPROL-XL) 25 MG 24 hr tablet TAKE ONE TABLET BY MOUTH EVERY DAY FOR BLOOD PRESSURE AND HEART RATE  30 tablet  9  . omeprazole (PRILOSEC) 20 MG capsule Take 20 mg by mouth daily.      . OXcarbazepine (TRILEPTAL) 300 MG/5ML suspension Take by mouth 1 day or 1 dose.      . Vilazodone HCl (VIIBRYD) 20 MG TABS Take 1 tablet by mouth daily.      Marland Kitchen DISCONTD: Vilazodone HCl (VIIBRYD) 20 MG TABS Take 20 mg by mouth 1 day or 1 dose.        Allergies as of 03/03/2012 -  Review Complete 03/03/2012  Allergen Reaction Noted  . Lithium  03/03/2012  . Pregabalin    . Trazodone and nefazodone  03/03/2012    Family History  Problem Relation Age of Onset  . Heart disease Maternal Grandfather   . Colon cancer Maternal Aunt   . Other Paternal Grandmother     brain tumor  . Dementia Mother   . Diabetes Mother     History   Social History  . Marital Status: Married    Spouse Name: N/A    Number of Children: 1  . Years of Education: N/A   Occupational History  . homemaker    Social History Main Topics  . Smoking status: Never Smoker   . Smokeless tobacco: Never Used  . Alcohol Use: No  . Drug Use: No  . Sexually Active: Not on file   Other Topics Concern  . Not on file   Social History Narrative  . No narrative on file       Physical Exam: BP 110/76  Pulse 84  Ht 5\' 4"  (1.626 m)  Wt 189 lb 2 oz (85.787 kg)  BMI  32.46 kg/m2 Constitutional: generally well-appearing Psychiatric: alert and oriented x3 Eyes: extraocular movements intact Mouth: oral pharynx moist, no lesions Neck: supple no lymphadenopathy Cardiovascular: heart regular rate and rhythm Lungs: clear to auscultation bilaterally Abdomen: soft, nontender, nondistended, no obvious ascites, no peritoneal signs, normal bowel sounds Extremities: no lower extremity edema bilaterally Skin: no lesions on visible extremities    Assessment and plan: 66 y.o. female with  chronic alternating bowel habits, IBS-like discomforts the lower abdomen  she had colonoscopy in 2003 and also 2007 I don't think those need to be repeated now. I suspect she is IBS-like problems, narcotic pain medicines and daily NSAIDs probably contributes to her GI distress. Recommended she try to cut back as best as possible most. She is going to start fiber supplements on a daily basis. I am also going to start her on twice daily scheduled antispasmodic medicines and she'll return to see me in 4-5 weeks.

## 2012-03-03 NOTE — Patient Instructions (Addendum)
Dr. Hyacinth Meeker at [facility], we will contact her for recent lab test results. Please start taking citrucel (orange flavored) powder fiber supplement.  This may cause some bloating at first but that usually goes away. Begin with a small spoonful and work your way up to a large, heaping spoonful daily over a week. Prescription for twice daily antispasm med was called in. Try to use as few pain medicines (narcotic and non-narcotic) as is possible. Return to see Dr. Christella Hartigan in 4-5 weeks.

## 2012-04-07 ENCOUNTER — Ambulatory Visit: Payer: Medicare Other | Admitting: Gastroenterology

## 2012-04-28 ENCOUNTER — Telehealth: Payer: Self-pay | Admitting: Gastroenterology

## 2012-04-28 NOTE — Telephone Encounter (Signed)
Message copied by Arna Snipe on Wed Apr 28, 2012 10:24 AM ------      Message from: Donata Duff      Created: Wed Apr 07, 2012  3:25 PM       Do not bill

## 2012-05-06 ENCOUNTER — Ambulatory Visit: Payer: Medicare Other | Admitting: Cardiology

## 2012-07-08 ENCOUNTER — Ambulatory Visit (INDEPENDENT_AMBULATORY_CARE_PROVIDER_SITE_OTHER): Payer: Medicare Other | Admitting: Emergency Medicine

## 2012-07-08 VITALS — BP 112/64 | HR 96 | Temp 98.8°F | Resp 18 | Ht 65.0 in

## 2012-07-08 DIAGNOSIS — M5430 Sciatica, unspecified side: Secondary | ICD-10-CM

## 2012-07-08 MED ORDER — PREDNISONE 10 MG PO KIT: PACK | ORAL | Status: DC

## 2012-07-08 NOTE — Progress Notes (Signed)
[facility] [address] [place] [address] [phone]  Date:  07/08/2012   Name:  [name] [name]   DOB:  04-02-1946   MRN:  [id number]  PCP:  Dois Davenport., MD    Chief Complaint: Hip Pain and Leg Pain   History of Present Illness:  [name] [name] is a 66 y.o. very pleasant female patient who presents with the following:  History of sciatic neuritis and underwent MRI this week which was significant for HNP.  Had epidural steroid injection with no improvement in her pain.  Has pain in left sciatic notch into left thigh.  Some weakness.  Denies acute injury or overuse.  Long history of symptoms.    Patient Active Problem List  Diagnosis  . HYPERTRIGLYCERIDEMIA  . ORTHOSTATIC HYPOTENSION  . DYSPNEA  . CHEST PAIN-UNSPECIFIED    Past Medical History  Diagnosis Date  . SOB (shortness of breath)     SOB from obesity and deconditioning 2009- eval included CPX  . Syncope 2008    no recurrence  . Breast cancer 1997    s/p lumpectomies  . Sinus tachycardia     mild resting  . Chest pain     no proven coronary disease  . HLD (hyperlipidemia)   . Orthostatic hypotension     slight in the past  . Anxiety   . Arthritis   . Depression   . Diverticulosis   . Fibromyalgia 1978  . Hypothyroidism     Past Surgical History  Procedure Date  . Total abdominal hysterectomy   . Axillary node dissection     right x 2  . Breast lumpectomy     right  . Tonsillectomy   . Refractive surgery     right  . Appendectomy   . Eye surgery     History  Substance Use Topics  . Smoking status: Never Smoker   . Smokeless tobacco: Never Used  . Alcohol Use: No    Family History  Problem Relation Age of Onset  . Heart disease Maternal Grandfather   . Colon cancer Maternal Aunt   . Other Paternal Grandmother     brain tumor  . Dementia Mother   . Diabetes Mother     Allergies  Allergen Reactions  . Lithium   . Pregabalin     REACTION:  diarrhea / nausea / rash  . Trazodone And Nefazodone     Medication list has been reviewed and updated.  Current Outpatient Prescriptions on File Prior to Visit  Medication Sig Dispense Refill  . amitriptyline (ELAVIL) 150 MG tablet Take 150 mg by mouth at bedtime.      Marland Kitchen buPROPion (ZYBAN) 150 MG 12 hr tablet Take 150 mg by mouth 3 (three) times daily.      . cetirizine (ZYRTEC) 10 MG tablet TAKE ONE TABLET BY MOUTH EVERY DAY  30 tablet  4  . levothyroxine (SYNTHROID, LEVOTHROID) 50 MCG tablet Take 50 mcg by mouth daily.      . metoprolol succinate (TOPROL-XL) 25 MG 24 hr tablet TAKE ONE TABLET BY MOUTH EVERY DAY FOR BLOOD PRESSURE AND HEART RATE  30 tablet  9  . omeprazole (PRILOSEC) 20 MG capsule Take 20 mg by mouth daily.      . OXcarbazepine (TRILEPTAL) 300 MG/5ML suspension Take by mouth 1 day or 1 dose.      . Vilazodone HCl (VIIBRYD) 20 MG TABS Take 1 tablet by mouth daily.      Marland Kitchen  hyoscyamine (LEVBID) 0.375 MG 12 hr tablet Take 1 tablet (0.375 mg total) by mouth every 12 (twelve) hours as needed for cramping.  60 tablet  3    Review of Systems:  As per HPI, otherwise negative.    Physical Examination: Filed Vitals:   07/08/12 1341  BP: 112/64  Pulse: 96  Temp: 98.8 F (37.1 C)  Resp: 18   Filed Vitals:   07/08/12 1341  Height: 5\' 5"  (1.651 m)   There is no weight on file to calculate BMI. Ideal Body Weight: Weight in (lb) to have BMI = 25: 149.9    GEN: WDWN, NAD, Non-toxic, Alert & Oriented x 3 HEENT: Atraumatic, Normocephalic.  Ears and Nose: No external deformity. EXTR: No clubbing/cyanosis/edema NEURO: Normal gait.  PSYCH: Normally interactive. Conversant. Not depressed or anxious appearing.  Calm demeanor.  BACK:  Negative Left Leg:  Tender left sciatic notch  Gross motor intact but for extension which is minimally less than right  Assessment and Plan: Sciatic neuritis Refer back to pain management Steroid dose pack  [name], MD

## 2012-07-16 NOTE — Progress Notes (Signed)
Reviewed and agree.

## 2012-07-19 ENCOUNTER — Ambulatory Visit: Payer: Medicare Other | Admitting: Cardiology

## 2012-08-07 ENCOUNTER — Encounter: Payer: Self-pay | Admitting: Cardiology

## 2012-08-07 DIAGNOSIS — F419 Anxiety disorder, unspecified: Secondary | ICD-10-CM | POA: Insufficient documentation

## 2012-08-07 DIAGNOSIS — E039 Hypothyroidism, unspecified: Secondary | ICD-10-CM | POA: Insufficient documentation

## 2012-08-07 DIAGNOSIS — E785 Hyperlipidemia, unspecified: Secondary | ICD-10-CM | POA: Insufficient documentation

## 2012-08-07 DIAGNOSIS — IMO0002 Reserved for concepts with insufficient information to code with codable children: Secondary | ICD-10-CM | POA: Insufficient documentation

## 2012-08-07 DIAGNOSIS — I951 Orthostatic hypotension: Secondary | ICD-10-CM | POA: Insufficient documentation

## 2012-08-07 DIAGNOSIS — M797 Fibromyalgia: Secondary | ICD-10-CM | POA: Insufficient documentation

## 2012-08-07 DIAGNOSIS — K5790 Diverticulosis of intestine, part unspecified, without perforation or abscess without bleeding: Secondary | ICD-10-CM | POA: Insufficient documentation

## 2012-08-07 DIAGNOSIS — R55 Syncope and collapse: Secondary | ICD-10-CM | POA: Insufficient documentation

## 2012-08-07 DIAGNOSIS — R9430 Abnormal result of cardiovascular function study, unspecified: Secondary | ICD-10-CM | POA: Insufficient documentation

## 2012-08-07 DIAGNOSIS — R0602 Shortness of breath: Secondary | ICD-10-CM | POA: Insufficient documentation

## 2012-08-07 DIAGNOSIS — R000 Tachycardia, unspecified: Secondary | ICD-10-CM | POA: Insufficient documentation

## 2012-08-07 DIAGNOSIS — M1990 Unspecified osteoarthritis, unspecified site: Secondary | ICD-10-CM | POA: Insufficient documentation

## 2012-08-07 DIAGNOSIS — R079 Chest pain, unspecified: Secondary | ICD-10-CM | POA: Insufficient documentation

## 2012-08-09 ENCOUNTER — Ambulatory Visit: Payer: Self-pay | Admitting: Cardiology

## 2012-08-10 ENCOUNTER — Emergency Department (HOSPITAL_COMMUNITY)
Admission: EM | Admit: 2012-08-10 | Discharge: 2012-08-10 | Disposition: A | Discharge status: Home / Self Care | Payer: Medicare Other | Attending: Emergency Medicine | Admitting: Emergency Medicine

## 2012-08-10 ENCOUNTER — Encounter (HOSPITAL_COMMUNITY): Payer: Self-pay | Admitting: Emergency Medicine

## 2012-08-10 ENCOUNTER — Emergency Department (HOSPITAL_COMMUNITY): Payer: Medicare Other

## 2012-08-10 DIAGNOSIS — R300 Dysuria: Secondary | ICD-10-CM | POA: Insufficient documentation

## 2012-08-10 DIAGNOSIS — Z79899 Other long term (current) drug therapy: Secondary | ICD-10-CM | POA: Insufficient documentation

## 2012-08-10 DIAGNOSIS — R109 Unspecified abdominal pain: Secondary | ICD-10-CM | POA: Insufficient documentation

## 2012-08-10 DIAGNOSIS — Z853 Personal history of malignant neoplasm of breast: Secondary | ICD-10-CM | POA: Insufficient documentation

## 2012-08-10 DIAGNOSIS — Z8739 Personal history of other diseases of the musculoskeletal system and connective tissue: Secondary | ICD-10-CM | POA: Insufficient documentation

## 2012-08-10 DIAGNOSIS — Z8679 Personal history of other diseases of the circulatory system: Secondary | ICD-10-CM | POA: Insufficient documentation

## 2012-08-10 DIAGNOSIS — N23 Unspecified renal colic: Secondary | ICD-10-CM | POA: Insufficient documentation

## 2012-08-10 DIAGNOSIS — Z8719 Personal history of other diseases of the digestive system: Secondary | ICD-10-CM | POA: Insufficient documentation

## 2012-08-10 DIAGNOSIS — N201 Calculus of ureter: Secondary | ICD-10-CM

## 2012-08-10 DIAGNOSIS — F329 Major depressive disorder, single episode, unspecified: Secondary | ICD-10-CM | POA: Insufficient documentation

## 2012-08-10 DIAGNOSIS — Z8659 Personal history of other mental and behavioral disorders: Secondary | ICD-10-CM | POA: Insufficient documentation

## 2012-08-10 DIAGNOSIS — E039 Hypothyroidism, unspecified: Secondary | ICD-10-CM | POA: Insufficient documentation

## 2012-08-10 DIAGNOSIS — E785 Hyperlipidemia, unspecified: Secondary | ICD-10-CM | POA: Insufficient documentation

## 2012-08-10 DIAGNOSIS — Z90710 Acquired absence of both cervix and uterus: Secondary | ICD-10-CM | POA: Insufficient documentation

## 2012-08-10 DIAGNOSIS — F3289 Other specified depressive episodes: Secondary | ICD-10-CM | POA: Insufficient documentation

## 2012-08-10 LAB — URINALYSIS, ROUTINE W REFLEX MICROSCOPIC
Glucose, UA: NEGATIVE mg/dL
Protein, ur: NEGATIVE mg/dL
pH: 5 (ref 5.0–8.0)

## 2012-08-10 LAB — URINE MICROSCOPIC-ADD ON

## 2012-08-10 MED ORDER — TAMSULOSIN HCL 0.4 MG PO CAPS: 0.4000 mg | ORAL_CAPSULE | Freq: Every day | ORAL | Status: DC

## 2012-08-10 MED ORDER — IBUPROFEN 200 MG PO TABS
400.0000 mg | ORAL_TABLET | Freq: Once | ORAL | Status: AC
  Administered 2012-08-10: 400 mg via ORAL
  Filled 2012-08-10: qty 2

## 2012-08-10 MED ORDER — ONDANSETRON HCL 4 MG PO TABS: 4.0000 mg | ORAL_TABLET | Freq: Three times a day (TID) | ORAL | Status: DC | PRN

## 2012-08-10 MED ORDER — OXYCODONE-ACETAMINOPHEN 5-325 MG PO TABS
1.0000 | ORAL_TABLET | Freq: Once | ORAL | Status: AC
  Administered 2012-08-10: 1 via ORAL
  Filled 2012-08-10: qty 1

## 2012-08-10 MED ORDER — HYDROCODONE-ACETAMINOPHEN 5-325 MG PO TABS: ORAL_TABLET | ORAL | Status: DC

## 2012-08-10 NOTE — ED Notes (Signed)
PT. REPORTS URINARY FREQUENCY /DYSURIA ONSET LAST NIGHT , DENIES HEMATURIA OR FEVER.

## 2012-08-10 NOTE — ED Notes (Signed)
Report given to Chris, rn  

## 2012-08-10 NOTE — ED Notes (Signed)
Patient transported to CT 

## 2012-08-10 NOTE — ED Provider Notes (Signed)
History     CSN: [id number]  Arrival date & time 08/10/12  7829   First MD Initiated Contact with Patient 08/10/12 [phone]      Chief Complaint  Patient presents with  . Urinary Frequency    (Consider location/radiation/quality/duration/timing/severity/associated sxs/prior treatment) Patient is a 66 y.o. female presenting with frequency. The history is provided by the patient and the spouse.  Urinary Frequency Pertinent negatives include no chest pain, no abdominal pain, no headaches and no shortness of breath.  pt c/o urinary frequency (small amts) last night, mild dysuria. Notes hx cystitis. States went to pcp 1-2 weeks ago w same, was prescribed cipro but then called and told to stop as her urine culture was negative.Normal appetite. No nv. States has had a pain suprapubic area that occasionally moves around towards left flank posteriorly. No hx kidney stones. No fever or chills. No vaginal discharge. Denies hx diabeties, no polyuria or polydipsia.     Past Medical History  Diagnosis Date  . SOB (shortness of breath)     SOB from obesity and deconditioning 2009- eval included CPX  . Syncope 2008    no recurrence,  2008  . Breast cancer 1997    s/p lumpectomies  . Sinus tachycardia     mild resting  . Chest pain     no proven coronary disease  . HLD (hyperlipidemia)   . Orthostatic hypotension     slight in the past  . Anxiety   . Arthritis   . Depression   . Diverticulosis   . Fibromyalgia 1978  . Hypothyroidism   . Ejection fraction     EF 65%, echo, 2008    Past Surgical History  Procedure Date  . Total abdominal hysterectomy   . Axillary node dissection     right x 2  . Breast lumpectomy     right  . Tonsillectomy   . Refractive surgery     right  . Appendectomy   . Eye surgery   . Breast lumpectomy     Family History  Problem Relation Age of Onset  . Heart disease Maternal Grandfather   . Colon cancer Maternal Aunt   . Other Paternal Grandmother      brain tumor  . Dementia Mother   . Diabetes Mother     History  Substance Use Topics  . Smoking status: Never Smoker   . Smokeless tobacco: Never Used  . Alcohol Use: No    OB History    Grav Para Term Preterm Abortions TAB SAB Ect Mult Living                  Review of Systems  Constitutional: Negative for fever and chills.  HENT: Negative for neck pain.   Eyes: Negative for redness.  Respiratory: Negative for shortness of breath.   Cardiovascular: Negative for chest pain.  Gastrointestinal: Negative for nausea, vomiting, abdominal pain, diarrhea and constipation.  Genitourinary: Positive for frequency. Negative for flank pain.  Musculoskeletal: Negative for back pain.  Skin: Negative for rash.  Neurological: Negative for headaches.  Hematological: Does not bruise/bleed easily.  Psychiatric/Behavioral: Negative for confusion.    Allergies  Lithium; Pregabalin; and Trazodone and nefazodone  Home Medications   Current Outpatient Rx  Name  Route  Sig  Dispense  Refill  . AMITRIPTYLINE HCL 150 MG PO TABS   Oral   Take 150 mg by mouth at bedtime.         Marland Kitchen BUPRENORPHINE 15 MCG/HR  TD PTWK   Transdermal   Place 1 mcg onto the skin.         Marland Kitchen BUPROPION HCL ER (SMOKING DET) 150 MG PO TB12   Oral   Take 150 mg by mouth 3 (three) times daily.         Marland Kitchen CETIRIZINE HCL 10 MG PO TABS      TAKE ONE TABLET BY MOUTH EVERY DAY   30 tablet   4   . HYDROCODONE-ACETAMINOPHEN 5-500 MG PO TABS   Oral   Take 1 tablet by mouth every 6 (six) hours as needed.         Marland Kitchen HYOSCYAMINE SULFATE ER 0.375 MG PO TB12   Oral   Take 1 tablet (0.375 mg total) by mouth every 12 (twelve) hours as needed for cramping.   60 tablet   3   . LEVOTHYROXINE SODIUM 50 MCG PO TABS   Oral   Take 50 mcg by mouth daily.         Marland Kitchen METOPROLOL SUCCINATE ER 25 MG PO TB24      TAKE ONE TABLET BY MOUTH EVERY DAY FOR BLOOD PRESSURE AND HEART RATE   30 tablet   9   . OMEPRAZOLE 20 MG  PO CPDR   Oral   Take 20 mg by mouth daily.         Marland Kitchen OXCARBAZEPINE 300 MG/5ML PO SUSP   Oral   Take by mouth 1 day or 1 dose.         Marland Kitchen PREDNISONE 10 MG PO KIT      Take all tabs for each day first thing in am with food.  Disp 48   1 kit   0   . VILAZODONE HCL 20 MG PO TABS   Oral   Take 1 tablet by mouth daily.           BP 132/57  Pulse 92  Temp 98.2 F (36.8 C) (Oral)  Resp 18  Ht 5\' 4"  (1.626 m)  Wt 180 lb (81.647 kg)  BMI 30.90 kg/m2  SpO2 100%  Physical Exam  Nursing note and vitals reviewed. Constitutional: She is oriented to person, place, and time. She appears well-developed and well-nourished. No distress.  HENT:  Nose: Nose normal.  Eyes: Conjunctivae normal are normal. No scleral icterus.  Neck: Neck supple. No tracheal deviation present.  Cardiovascular: Normal rate.   Pulmonary/Chest: Effort normal. No respiratory distress.  Abdominal: Soft. Normal appearance and bowel sounds are normal. She exhibits no distension and no mass. There is no tenderness. There is no rebound and no guarding.  Genitourinary:       No cva tenderness  Musculoskeletal: She exhibits no edema.  Neurological: She is alert and oriented to person, place, and time.       Steady gait  Skin: Skin is warm and dry. No rash noted.  Psychiatric: She has a normal mood and affect.    ED Course  Procedures (including critical care time)   Labs Reviewed  URINALYSIS, ROUTINE W REFLEX MICROSCOPIC   Results for orders placed during the hospital encounter of 08/10/12  URINALYSIS, ROUTINE W REFLEX MICROSCOPIC      Component Value Range   Color, Urine YELLOW  YELLOW   APPearance CLOUDY (*) CLEAR   Specific Gravity, Urine 1.040 (*) 1.005 - 1.030   pH 5.0  5.0 - 8.0   Glucose, UA NEGATIVE  NEGATIVE mg/dL   Hgb urine dipstick LARGE (*) NEGATIVE  Bilirubin Urine SMALL (*) NEGATIVE   Ketones, ur NEGATIVE  NEGATIVE mg/dL   Protein, ur NEGATIVE  NEGATIVE mg/dL   Urobilinogen, UA  0.2  0.0 - 1.0 mg/dL   Nitrite NEGATIVE  NEGATIVE   Leukocytes, UA NEGATIVE  NEGATIVE  URINE MICROSCOPIC-ADD ON      Component Value Range   Squamous Epithelial / LPF RARE  RARE   WBC, UA 0-2  <3 WBC/hpf   RBC / HPF 11-20  <3 RBC/hpf   Bacteria, UA RARE  RARE   Crystals CA OXALATE CRYSTALS (*) NEGATIVE   Urine-Other LESS THAN 10 mL OF URINE SUBMITTED         MDM  Ua.  ua neg for infxn and pt reports recent ur cx neg.  Given left flank pain, blood in urine, ca ox crystals, will get ct r/o ureteral stone - signed out to Dr Clarene Duke to check ct when back, dispo pt appropriately.   Percocet po. Motrin po.         Suzi Roots, MD 08/10/12 [phone]

## 2012-08-10 NOTE — ED Provider Notes (Signed)
Received pt as change of shift.  66yo F, c/o urinary freq since last night. Saw PMD approx 1-2 weeks ago for same, rx cipro, but was d/c'd because UC negative.  States having suprapubic pain rad into left flank.  Denies N/V.  No fevers.  Pt VSS, resps easy, sitting in chair at bedside, NAD.  No clear UTI on Udip, UC pending.  CT with 2mm UVJ ureteral stone.  Will tx symptomatically, f/u Uro MD.  (States she has seen Dr. Jacquelyne Balint "years ago.")  Results for orders placed during the hospital encounter of 08/10/12  URINALYSIS, ROUTINE W REFLEX MICROSCOPIC      Component Value Range   Color, Urine YELLOW  YELLOW   APPearance CLOUDY (*) CLEAR   Specific Gravity, Urine 1.040 (*) 1.005 - 1.030   pH 5.0  5.0 - 8.0   Glucose, UA NEGATIVE  NEGATIVE mg/dL   Hgb urine dipstick LARGE (*) NEGATIVE   Bilirubin Urine SMALL (*) NEGATIVE   Ketones, ur NEGATIVE  NEGATIVE mg/dL   Protein, ur NEGATIVE  NEGATIVE mg/dL   Urobilinogen, UA 0.2  0.0 - 1.0 mg/dL   Nitrite NEGATIVE  NEGATIVE   Leukocytes, UA NEGATIVE  NEGATIVE  URINE MICROSCOPIC-ADD ON      Component Value Range   Squamous Epithelial / LPF RARE  RARE   WBC, UA 0-2  <3 WBC/hpf   RBC / HPF 11-20  <3 RBC/hpf   Bacteria, UA RARE  RARE   Crystals CA OXALATE CRYSTALS (*) NEGATIVE   Urine-Other LESS THAN 10 mL OF URINE SUBMITTED     Ct Abdomen Pelvis Wo Contrast 08/10/2012  *RADIOLOGY REPORT*  Clinical Data: Hematuria, left flank pain  CT ABDOMEN AND PELVIS WITHOUT CONTRAST  Technique:  Multidetector CT imaging of the abdomen and pelvis was performed following the standard protocol without intravenous contrast.  Comparison: 10/19/2008  Findings: Sagittal images of the spine shows multilevel disc space flattening with vacuum disc phenomenon anterior spurring and endplate sclerotic changes lumbar spine.  Mild posterior disc bulge at L3-L4 L4-L5 and L5-S1 level.  Lung bases are unremarkable.  There is mild thickening of distal esophageal wall.  Clinical  correlation is necessary to exclude gastroesophageal reflux disease.  Mild hepatic fatty infiltration.  No intrahepatic biliary ductal dilatation.  No calcified gallstones are noted within gallbladder. Partially fatty replaced pancreas.  Unenhanced spleen and adrenal glands are unremarkable.  There is nonobstructive calcified calculus in the upper pole of the left kidney measures 5.8 mm.  Mild left hydronephrosis and minimal left hydroureter.  Bilateral no proximal or mid calcified ureteral calculi are noted.  No aortic aneurysm.  Normal appendix is clearly visualized in axial image 54.  No pericecal inflammation.  No small bowel obstruction.  No ascites or free air.  No adenopathy.  Limited assessment of the urinary bladder which is empty collapsed. In axial image 79 there is 2 mm nonobstructive calcified calculus in the left UVJ.  No destructive bony lesions are noted within pelvis.  Mild degenerative changes SI joints.  IMPRESSION:  1.  There is left nonobstructive nephrolithiasis. 2.  Mild left hydronephrosis and minimal left hydroureter.  There is 2 mm calcified nonobstructive calculus in the left UVJ. 3.  Degenerative changes lumbar spine. 4.  Mild hepatic fatty infiltration. 5.  Normal appendix.  No pericecal inflammation.   Original Report Authenticated By: Natasha Mead, M.D.       Laray Anger, DO 08/10/12 [phone]

## 2012-08-12 LAB — URINE CULTURE

## 2012-08-13 NOTE — ED Notes (Signed)
+   Urine Chart sent to EDP office for review. 

## 2012-08-25 DIAGNOSIS — N200 Calculus of kidney: Secondary | ICD-10-CM

## 2012-08-25 HISTORY — PX: BREAST BIOPSY: SHX20

## 2012-08-25 HISTORY — DX: Calculus of kidney: N20.0

## 2012-09-13 ENCOUNTER — Ambulatory Visit: Payer: Self-pay | Admitting: Cardiology

## 2012-09-16 ENCOUNTER — Ambulatory Visit (INDEPENDENT_AMBULATORY_CARE_PROVIDER_SITE_OTHER): Payer: Medicare Other | Admitting: Cardiology

## 2012-09-16 ENCOUNTER — Encounter: Payer: Self-pay | Admitting: Cardiology

## 2012-09-16 VITALS — BP 138/78 | HR 94 | Ht 65.0 in | Wt 193.8 lb

## 2012-09-16 DIAGNOSIS — I498 Other specified cardiac arrhythmias: Secondary | ICD-10-CM

## 2012-09-16 DIAGNOSIS — R9430 Abnormal result of cardiovascular function study, unspecified: Secondary | ICD-10-CM

## 2012-09-16 DIAGNOSIS — R0989 Other specified symptoms and signs involving the circulatory and respiratory systems: Secondary | ICD-10-CM

## 2012-09-16 DIAGNOSIS — R000 Tachycardia, unspecified: Secondary | ICD-10-CM

## 2012-09-16 DIAGNOSIS — I951 Orthostatic hypotension: Secondary | ICD-10-CM

## 2012-09-16 DIAGNOSIS — R0602 Shortness of breath: Secondary | ICD-10-CM

## 2012-09-16 DIAGNOSIS — R55 Syncope and collapse: Secondary | ICD-10-CM

## 2012-09-16 DIAGNOSIS — R079 Chest pain, unspecified: Secondary | ICD-10-CM

## 2012-09-16 NOTE — Assessment & Plan Note (Signed)
The patient is not having any significant shortness of breath at this time. No change in therapy.

## 2012-09-16 NOTE — Patient Instructions (Addendum)
Your physician wants you to follow-up in:  2 years. You will receive a reminder letter in the mail two months in advance. If you don't receive a letter, please call our office to schedule the follow-up appointment.   

## 2012-09-16 NOTE — Assessment & Plan Note (Signed)
Patient is not having any significant chest pain at this time. No further workup.

## 2012-09-16 NOTE — Assessment & Plan Note (Signed)
We know historically the patient has normal left ventricular function. No further workup is needed.

## 2012-09-16 NOTE — Assessment & Plan Note (Signed)
The patient had dizziness  And possible presyncope after taking the first dose of gabapentin. There is been no recurrent problems since then. She's had no true syncope of a cardiac nature.

## 2012-09-16 NOTE — Assessment & Plan Note (Signed)
There have been no further symptoms of significant orthostatic hypotension. No change in therapy.

## 2012-09-16 NOTE — Assessment & Plan Note (Signed)
Historically there has been some sinus tachycardia at rest. Heart rate is controlled today.

## 2012-09-16 NOTE — Progress Notes (Signed)
HPI  Patient is seen to followup for cardiac status. I saw her last in July, 2011 she has not been seen in our office for over 2 years. She had had some orthostatic dizziness in the past. She had some shortness of breath. Ultimately a cardiopulmonary exercise test was done. It was felt that her shortness of breath was related to being overweight and deconditioning. There's been no proven coronary disease. Ejection fraction has been normal historically.  Most recently she's been very bothered by lumbar disc disease. She's also had some kidney stones. She has not had any classic angina. She has not had any marked orthostatic problems. She did become dizzy after taking her first dose of gabapentin.  As part of today's evaluation I have completely review the old cardiology records. I have completely updated the new electronic medical record from the cardiac viewpoint.  Allergies  Allergen Reactions  . Lithium Other (See Comments)    dizziness  . Pregabalin     REACTION: diarrhea / nausea / rash  . Trazodone And Nefazodone Other (See Comments)    wakefulness    Current Outpatient Prescriptions  Medication Sig Dispense Refill  . amitriptyline (ELAVIL) 150 MG tablet Take 150 mg by mouth at bedtime.      Marland Kitchen buPROPion (ZYBAN) 150 MG 12 hr tablet Take 150 mg by mouth 3 (three) times daily.      Marland Kitchen gabapentin (NEURONTIN) 300 MG capsule Take 300 mg by mouth daily. Takes HS      . levothyroxine (SYNTHROID, LEVOTHROID) 50 MCG tablet Take 50 mcg by mouth daily.      . metoprolol succinate (TOPROL-XL) 25 MG 24 hr tablet Take 25 mg by mouth daily.      . naproxen (NAPROSYN) 500 MG tablet Take 500 mg by mouth 2 (two) times daily with a meal.      . omeprazole (PRILOSEC) 20 MG capsule Take 20 mg by mouth daily.      . OXcarbazepine (TRILEPTAL) 300 MG/5ML suspension Take by mouth 1 day or 1 dose.      . oxyCODONE-acetaminophen (PERCOCET/ROXICET) 5-325 MG per tablet Take 1 tablet by mouth every 4 (four)  hours as needed. For pain      . Vilazodone HCl (VIIBRYD) 20 MG TABS Take 1 tablet by mouth daily.        History   Social History  . Marital Status: Married    Spouse Name: N/A    Number of Children: 1  . Years of Education: N/A   Occupational History  . homemaker    Social History Main Topics  . Smoking status: Never Smoker   . Smokeless tobacco: Never Used  . Alcohol Use: No  . Drug Use: No  . Sexually Active: Not on file   Other Topics Concern  . Not on file   Social History Narrative  . No narrative on file    Family History  Problem Relation Age of Onset  . Heart disease Maternal Grandfather   . Colon cancer Maternal Aunt   . Other Paternal Grandmother     brain tumor  . Dementia Mother   . Diabetes Mother     Past Medical History  Diagnosis Date  . SOB (shortness of breath)     SOB from obesity and deconditioning 2009- eval included CPX  . Syncope 2008    no recurrence,  2008  . Breast cancer 1997    s/p lumpectomies  . Sinus tachycardia  mild resting  . Chest pain     no proven coronary disease  . HLD (hyperlipidemia)   . Orthostatic hypotension     slight in the past  . Anxiety   . Arthritis   . Depression   . Diverticulosis   . Fibromyalgia 1978  . Hypothyroidism   . Ejection fraction     EF 65%, echo, 2008    Past Surgical History  Procedure Date  . Total abdominal hysterectomy   . Axillary node dissection     right x 2  . Breast lumpectomy     right  . Tonsillectomy   . Refractive surgery     right  . Appendectomy   . Eye surgery   . Breast lumpectomy     Patient Active Problem List  Diagnosis  . SOB (shortness of breath)  . Syncope  . Breast cancer  . Sinus tachycardia  . Chest pain  . HLD (hyperlipidemia)  . Orthostatic hypotension  . Anxiety  . Arthritis  . Diverticulosis  . Fibromyalgia  . Hypothyroidism  . Ejection fraction    ROS   Patient denies fever, chills, headache, sweats, rash, change in  vision, change in hearing, chest pain, cough, nausea vomiting, urinary symptoms. All other systems are reviewed and are negative.  PHYSICAL EXAM  Patient is oriented to person time and place. Affect is normal. There is no jugulovenous distention. Lungs are clear. Respiratory effort is nonlabored. Cardiac exam reveals S1 and S2. There no clicks or significant murmurs. The abdomen is soft. Is no peripheral edema. Patient is overweight.  There no musculoskeletal deformities. There are no skin rashes.  Filed Vitals:   09/16/12 1422  BP: 138/78  Pulse: 94  Height: 5\' 5"  (1.651 m)  Weight: 193 lb 12 oz (87.884 kg)   EKG is done today and reviewed by me. EKG shows no significant abnormality. There is no significant change from the past.  ASSESSMENT & PLAN

## 2012-10-27 ENCOUNTER — Telehealth: Payer: Self-pay | Admitting: Radiology

## 2012-10-27 NOTE — Telephone Encounter (Signed)
Dr Cleta Alberts wants to make sure patient did go for here extra mammogram views of her R breast. Called her and she did go for this and calcium deposits were seen on additional views which did not show up on the first views.

## 2012-11-10 ENCOUNTER — Encounter: Payer: Self-pay | Admitting: Emergency Medicine

## 2013-02-17 DIAGNOSIS — Z01419 Encounter for gynecological examination (general) (routine) without abnormal findings: Secondary | ICD-10-CM

## 2013-03-01 ENCOUNTER — Encounter: Payer: Self-pay | Admitting: Obstetrics & Gynecology

## 2013-03-02 ENCOUNTER — Ambulatory Visit: Payer: Medicare Other | Admitting: Obstetrics & Gynecology

## 2013-03-02 ENCOUNTER — Encounter: Payer: Self-pay | Admitting: Obstetrics & Gynecology

## 2013-03-02 DIAGNOSIS — Z01419 Encounter for gynecological examination (general) (routine) without abnormal findings: Secondary | ICD-10-CM

## 2013-03-03 ENCOUNTER — Other Ambulatory Visit: Payer: Self-pay | Admitting: Cardiology

## 2013-03-04 ENCOUNTER — Ambulatory Visit: Payer: Medicare Other | Admitting: Obstetrics & Gynecology

## 2013-03-10 ENCOUNTER — Telehealth: Payer: Self-pay

## 2013-03-10 NOTE — Telephone Encounter (Signed)
Pharmacy sent fax requesting a change of manufacturer on patients synthroid. Per DL if it makes any changes to the medication we do not want to change. Per pharmacist is could potentially alter this medication, so they will order the same one for patient.

## 2013-03-29 ENCOUNTER — Ambulatory Visit: Payer: Medicare Other | Admitting: Obstetrics & Gynecology

## 2013-03-29 ENCOUNTER — Encounter: Payer: Self-pay | Admitting: Obstetrics & Gynecology

## 2013-04-03 ENCOUNTER — Other Ambulatory Visit: Payer: Self-pay | Admitting: Certified Nurse Midwife

## 2013-04-04 NOTE — Telephone Encounter (Signed)
[facility] request for refill on SYNTHROID Last filled - 08/20/12, #30 X 6 Last AEX - 02/04/12 Next AEX - 04/11/13 Please advise refills.  Thank you.

## 2013-04-11 ENCOUNTER — Encounter: Payer: Self-pay | Admitting: Obstetrics & Gynecology

## 2013-04-11 ENCOUNTER — Ambulatory Visit (INDEPENDENT_AMBULATORY_CARE_PROVIDER_SITE_OTHER): Payer: Medicare Other | Admitting: Obstetrics & Gynecology

## 2013-04-11 VITALS — BP 134/82 | HR 72 | Resp 16 | Ht 64.5 in | Wt 186.4 lb

## 2013-04-11 DIAGNOSIS — R05 Cough: Secondary | ICD-10-CM

## 2013-04-11 DIAGNOSIS — Z01419 Encounter for gynecological examination (general) (routine) without abnormal findings: Secondary | ICD-10-CM

## 2013-04-11 DIAGNOSIS — R059 Cough, unspecified: Secondary | ICD-10-CM

## 2013-04-11 NOTE — Progress Notes (Signed)
67 y.o. G1P1 MarriedCaucasianF here for annual exam.  Doing well.  No vaginal bleeding.  Had a kidney stone in December.  Passed the stone on her own.  Review of chart shows cardiology visit in [place] with Dr. Myrtis Ser.  EKG was normal.  Has follow-up 2 years.  Also had neurology visit due to memory issues.  Saw Dr. Christella Hartigan for GI issues last year.  Was supposed to follow-up and she didn't.  I asked her about this.  She says she never really took very much of the medication.  Biggest complaint this year is cough and some chest tightness.  H/O asthma but patient reports that evaluation of this ultimately ruled out asthma.  Also complains of increased itching on skin.  Was scratching herself so much she was bleeding.  She decided it must have been a lotion she was using.  Has stopped this and feels much better.  Taking care of her mother who lives with them.  She states this is very stressful.   Patient's last menstrual period was 08/26/1995.          Sexually active: no  The current method of family planning is status post hysterectomy.    Exercising: no  not regularly Smoker:  no  Health Maintenance: Pap:  02/04/12 WNL History of abnormal Pap:  no MMG:  09/29/12 MMG, 10/05/12 additional views-normal (was done for possible calcifications) Colonoscopy:  2007, saw Dr. Christella Hartigan last year who said not due yet BMD:   5/11 normal TDaP:  07/23/06 Screening Labs: PCP, Hb today: PCP, Urine today: PCP   reports that she has never smoked. She has never used smokeless tobacco. She reports that she does not drink alcohol or use illicit drugs.  Past Medical History  Diagnosis Date  . SOB (shortness of breath)     SOB from obesity and deconditioning 2009- eval included CPX  . Syncope 2008    no recurrence,  2008  . Breast cancer 1997    s/p lumpectomies  . Sinus tachycardia     mild resting  . Chest pain     no proven coronary disease  . HLD (hyperlipidemia)   . Orthostatic hypotension     slight in the  past  . Anxiety   . Arthritis   . Depression   . Diverticulosis   . Fibromyalgia 1978  . Hypothyroidism   . Ejection fraction     EF 65%, echo, 2008  . Asthma   . Spinal stenosis     and DDD  . Infertility   . 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 Breast cancer 1997  . Alopecia   . Acid reflux   . Ruptured disk     one in neck and two in back  . DDD (degenerative disc disease)   . Plantar fasciitis     Past Surgical History  Procedure Laterality Date  . Total abdominal hysterectomy    . Axillary node dissection      right x 2  . Breast lumpectomy      right  . Tonsillectomy    . Refractive surgery      right  . Eye surgery    . Breast lumpectomy    . Lumbar epidural injection      has had 7 injections    Current Outpatient Prescriptions  Medication Sig Dispense Refill  . amitriptyline (ELAVIL) 150 MG tablet Take 150 mg by mouth at bedtime.      Marland Kitchen aspirin 81 MG  tablet Take 81 mg by mouth daily.      Marland Kitchen buPROPion (ZYBAN) 150 MG 12 hr tablet Take 150 mg by mouth 3 (three) times daily.      . Calcium-Magnesium-Vitamin D (CALCIUM 1200+D3 PO) Take by mouth daily.      . cetirizine (ZYRTEC) 10 MG tablet Take 10 mg by mouth daily.      . diphenhydrAMINE (BENADRYL) 25 mg capsule Take 25 mg by mouth every 6 (six) hours as needed for itching.      . gabapentin (NEURONTIN) 300 MG capsule Take 300 mg by mouth daily. Takes HS      . levothyroxine (SYNTHROID, LEVOTHROID) 50 MCG tablet TAKE ONE TABLET BY MOUTH EVERY DAY  30 tablet  1  . metoprolol succinate (TOPROL-XL) 25 MG 24 hr tablet TAKE ONE TABLET BY MOUTH EVERY DAY FOR BLOOD PRESSURE AND HEART RATE  30 tablet  9  . Multiple Vitamins-Minerals (MULTIVITAMIN PO) Take by mouth daily.      . naproxen (NAPROSYN) 500 MG tablet Take 500 mg by mouth 2 (two) times daily with a meal.      . Omega-3 Fatty Acids (FISH OIL PO) Take by mouth daily.      Marland Kitchen omeprazole (PRILOSEC) 20 MG capsule Take 20 mg by mouth daily.      .  OXcarbazepine (TRILEPTAL) 300 MG/5ML suspension Take by mouth 1 day or 1 dose.      . oxyCODONE-acetaminophen (PERCOCET/ROXICET) 5-325 MG per tablet Take 1 tablet by mouth every 4 (four) hours as needed. For pain      . Vilazodone HCl (VIIBRYD) 20 MG TABS Take 1 tablet by mouth daily.       No current facility-administered medications for this visit.    Family History  Problem Relation Age of Onset  . Heart disease Maternal Grandfather   . Colon cancer Maternal Aunt   . Other Paternal Grandmother     brain tumor  . Dementia Mother   . Diabetes Mother   . Diabetes Maternal Grandfather   . Diabetes Maternal Aunt   . Prostate cancer Father   . Brain cancer Maternal Grandmother   . Bipolar disorder Maternal Aunt   . Osteoporosis Mother   . Stroke Maternal Aunt     ROS:  Pertinent items are noted in HPI.  Otherwise, a comprehensive ROS was negative.  Exam:   BP 134/82  Pulse 72  Resp 16  Ht 5' 4.5" (1.638 m)  Wt 186 lb 6.4 oz (84.55 kg)  BMI 31.51 kg/m2  LMP 08/26/1995  Weight change: -4lb Height:   Height: 5' 4.5" (163.8 cm)  Ht Readings from Last 3 Encounters:  04/11/13 5' 4.5" (1.638 m)  09/16/12 5\' 5"  (1.651 m)  08/10/12 5\' 4"  (1.626 m)    General appearance: alert, cooperative and appears stated age Head: Normocephalic, without obvious abnormality, atraumatic Neck: no adenopathy, supple, symmetrical, trachea midline and thyroid normal to inspection and palpation Lungs: clear to auscultation bilaterally Breasts: scarring along right breast, left breast without findings Heart: regular rate and rhythm Abdomen: soft, non-tender; bowel sounds normal; no masses,  no organomegaly Extremities: extremities normal, atraumatic, no cyanosis or edema Skin: Skin color, texture, turgor normal. No rashes or lesions Lymph nodes: Cervical, supraclavicular, and axillary nodes normal. No abnormal inguinal nodes palpated Neurologic: Grossly normal   Pelvic: External genitalia:  no  lesions              Urethra:  normal appearing urethra with no masses,  tenderness or lesions              Bartholins and Skenes: normal                 Vagina: normal appearing vagina with normal color and discharge, no lesions              Cervix: absent              Pap taken: no Bimanual Exam:  Uterus:  uterus absent              Adnexa: no mass, fullness, tenderness               Rectovaginal: Confirms               Anus:  normal sphincter tone, no lesions  A:  Well Woman with normal exam PMP, No HRT Severe hot flashes despite being on multiple SSRIs Cough/chest tightness Diarrhea--encouraged to call back Dr. Christella Hartigan and go for follow-up H/O breast cancer 1997 Alopecia H/O fatty liver disease, 2010  P:   Mammogram yearly pap smear 2013 Labs with PCP CXR, PA and lateral return annually or prn  An After Visit Summary was printed and given to the patient.

## 2013-04-11 NOTE — Patient Instructions (Signed)

## 2013-04-13 ENCOUNTER — Ambulatory Visit
Admission: RE | Admit: 2013-04-13 | Discharge: 2013-04-13 | Disposition: A | Discharge status: Home / Self Care | Payer: Medicare Other | Source: Ambulatory Visit | Attending: Obstetrics & Gynecology | Admitting: Obstetrics & Gynecology

## 2013-04-13 DIAGNOSIS — R05 Cough: Secondary | ICD-10-CM

## 2013-04-13 DIAGNOSIS — R059 Cough, unspecified: Secondary | ICD-10-CM

## 2013-05-10 ENCOUNTER — Encounter: Payer: Self-pay | Admitting: Family Medicine

## 2013-05-10 DIAGNOSIS — G894 Chronic pain syndrome: Secondary | ICD-10-CM | POA: Insufficient documentation

## 2013-05-10 DIAGNOSIS — Z79899 Other long term (current) drug therapy: Secondary | ICD-10-CM | POA: Insufficient documentation

## 2013-05-10 DIAGNOSIS — IMO0002 Reserved for concepts with insufficient information to code with codable children: Secondary | ICD-10-CM | POA: Insufficient documentation

## 2013-05-10 NOTE — Assessment & Plan Note (Signed)
Low back, hip, neck, upper extremities, wide spread myalgias and arthralgias

## 2013-05-16 ENCOUNTER — Encounter: Payer: Self-pay | Admitting: Family Medicine

## 2013-05-16 ENCOUNTER — Encounter: Payer: Self-pay | Admitting: Physician Assistant

## 2013-05-16 DIAGNOSIS — IMO0002 Reserved for concepts with insufficient information to code with codable children: Secondary | ICD-10-CM

## 2013-05-25 DIAGNOSIS — C9110 Chronic lymphocytic leukemia of B-cell type not having achieved remission: Secondary | ICD-10-CM

## 2013-05-25 HISTORY — DX: Chronic lymphocytic leukemia of B-cell type not having achieved remission: C91.10

## 2013-05-27 ENCOUNTER — Telehealth: Payer: Self-pay | Admitting: Family Medicine

## 2013-05-27 NOTE — Telephone Encounter (Signed)
Cold,cough,congestion.  No appts available.  Cough worsening, appetite down, sore throat.  Recommended Urgent care for today.

## 2013-05-30 ENCOUNTER — Ambulatory Visit (INDEPENDENT_AMBULATORY_CARE_PROVIDER_SITE_OTHER): Payer: Medicare Other | Admitting: Physician Assistant

## 2013-05-30 ENCOUNTER — Other Ambulatory Visit: Payer: Self-pay | Admitting: Family Medicine

## 2013-05-30 ENCOUNTER — Encounter: Payer: Self-pay | Admitting: Physician Assistant

## 2013-05-30 VITALS — BP 114/66 | HR 104 | Temp 99.2°F | Resp 20 | Ht 64.0 in | Wt 183.0 lb

## 2013-05-30 DIAGNOSIS — J029 Acute pharyngitis, unspecified: Secondary | ICD-10-CM

## 2013-05-30 DIAGNOSIS — B9689 Other specified bacterial agents as the cause of diseases classified elsewhere: Secondary | ICD-10-CM

## 2013-05-30 DIAGNOSIS — J988 Other specified respiratory disorders: Secondary | ICD-10-CM

## 2013-05-30 DIAGNOSIS — H9191 Unspecified hearing loss, right ear: Secondary | ICD-10-CM

## 2013-05-30 DIAGNOSIS — H9190 Unspecified hearing loss, unspecified ear: Secondary | ICD-10-CM

## 2013-05-30 DIAGNOSIS — A499 Bacterial infection, unspecified: Secondary | ICD-10-CM

## 2013-05-30 MED ORDER — AZITHROMYCIN 250 MG PO TABS: ORAL_TABLET | ORAL | Status: DC

## 2013-05-30 NOTE — Progress Notes (Signed)
Patient ID: [name] [name] MRN: [id number], DOB: 03/30/1946, 67 y.o. Date of Encounter: 05/30/2013, 11:51 AM    Chief Complaint:  Chief Complaint  Patient presents with  . Cough    x 1 week,  taking OTC Mucinex     HPI: 67 y.o. year old female reports that she's been sick for over one week. Started with a cough then had one day of sore throat. Has continued to have a lot of cough and a lot of harsh cough. Getting out thick green mucus and phlegm. No fevers or chills. Using over-the-counter Mucinex.     Home Meds: See attached medication section for any medications that were entered at today's visit. The computer does not put those onto this list.The following list is a list of meds entered prior to today's visit.   Current Outpatient Prescriptions on File Prior to Visit  Medication Sig Dispense Refill  . amitriptyline (ELAVIL) 150 MG tablet Take 150 mg by mouth at bedtime.      Marland Kitchen aspirin 81 MG tablet Take 81 mg by mouth daily.      Marland Kitchen buPROPion (ZYBAN) 150 MG 12 hr tablet Take 150 mg by mouth 3 (three) times daily.      . Calcium-Magnesium-Vitamin D (CALCIUM 1200+D3 PO) Take by mouth daily.      . cetirizine (ZYRTEC) 10 MG tablet Take 10 mg by mouth daily.      . diphenhydrAMINE (BENADRYL) 25 mg capsule Take 25 mg by mouth every 6 (six) hours as needed for itching.      . fentaNYL (DURAGESIC) 12 MCG/HR Place 1 patch onto the skin every 3 (three) days.      Marland Kitchen gabapentin (NEURONTIN) 300 MG capsule Take 300 mg by mouth. 300mg  with evening meal, 300mg  at bedtime      . levothyroxine (SYNTHROID, LEVOTHROID) 50 MCG tablet TAKE ONE TABLET BY MOUTH EVERY DAY  30 tablet  1  . metoprolol succinate (TOPROL-XL) 25 MG 24 hr tablet TAKE ONE TABLET BY MOUTH EVERY DAY FOR BLOOD PRESSURE AND HEART RATE  30 tablet  9  . Multiple Vitamins-Minerals (MULTIVITAMIN PO) Take by mouth daily.      . naproxen (NAPROSYN) 500 MG tablet Take 500 mg by mouth 2 (two) times daily with a meal.      . Omega-3 Fatty  Acids (FISH OIL PO) Take by mouth daily.      Marland Kitchen omeprazole (PRILOSEC) 20 MG capsule Take 20 mg by mouth daily.      . OXcarbazepine (TRILEPTAL) 300 MG/5ML suspension Take by mouth 1 day or 1 dose.      . oxyCODONE-acetaminophen (PERCOCET/ROXICET) 5-325 MG per tablet Take 1 tablet by mouth every 4 (four) hours as needed. For pain      . Vilazodone HCl (VIIBRYD) 20 MG TABS Take 1 tablet by mouth daily.      . chlorzoxazone (PARAFON) 500 MG tablet Take 500 mg by mouth every 8 (eight) hours as needed for muscle spasms (1/2 to one tablet).       No current facility-administered medications on file prior to visit.    Allergies:  Allergies  Allergen Reactions  . Clonazepam   . Lasix [Furosemide]     Ears, nose and throat feel like closing  . Lithium Other (See Comments)    dizziness  . Pregabalin     REACTION: diarrhea / nausea / rash  . Sulfa Antibiotics Hives  . Trazodone And Nefazodone Other (See Comments)    wakefulness  Review of Systems: See HPI for pertinent ROS. All other ROS negative.    Physical Exam: Blood pressure 114/66, pulse 104, temperature 99.2 F (37.3 C), temperature source Oral, resp. rate 20, height 5\' 4"  (1.626 m), weight 183 lb (83.008 kg), last menstrual period 08/26/1995., Body mass index is 31.4 kg/(m^2). General:  Otherwise well-developed white female . Appears in no acute distress. HEENT: Normocephalic, atraumatic, eyes without discharge, sclera non-icteric, nares are without discharge. Bilateral auditory canals clear, TM's are without perforation, pearly grey and translucent with reflective cone of light bilaterally. Oral cavity moist, posterior pharynx without exudate, erythema, peritonsillar abscess, or post nasal drip. Frontal and maxillary sinuses with minimal tenderness with percussion.  Neck: Supple. No thyromegaly. No lymphadenopathy. Lungs: Clear bilaterally to auscultation without wheezes, rales, or rhonchi. Breathing is unlabored. Heart: Regular  rhythm. No murmurs, rubs, or gallops. Msk:  Strength and tone normal for age. Extremities/Skin: Warm and dry. No clubbing or cyanosis. No edema. No rashes or suspicious lesions. Neuro: Alert and oriented X 3. Moves all extremities spontaneously. Gait is normal. CNII-XII grossly in tact. Psych:  Responds to questions appropriately with a normal affect.      ASSESSMENT AND PLAN:  67 y.o. year old female with  1. Bacterial respiratory infection - azithromycin (ZITHROMAX) 250 MG tablet; Day 1: Take 2 daily.  Days 2-5: Take one daily.  Dispense: 6 tablet; Refill: 0  2. Sore throat - Rapid Strep Screen  Complete antibiotics. Recommend Mucinex D. and to loosen phlegm. Drink lots of clear liquids. Follow up if symptoms do not resolve.   Signed, [address] [place], [place], [facility] 05/30/2013 11:51 AM

## 2013-05-30 NOTE — Progress Notes (Signed)
[facility] pharmacy does not have Azithromycin due to their pending closure.  Spoke to husband and Rx requested to be sent to [facility] in [place].  Pharmacy change on patient profile.  RX sent.

## 2013-05-30 NOTE — Telephone Encounter (Signed)
[facility] pharmacy does not have medication.  Resent to [facility] per pt request

## 2013-06-02 ENCOUNTER — Telehealth: Payer: Self-pay | Admitting: Family Medicine

## 2013-06-02 ENCOUNTER — Ambulatory Visit (INDEPENDENT_AMBULATORY_CARE_PROVIDER_SITE_OTHER): Payer: Medicare Other | Admitting: Physician Assistant

## 2013-06-02 ENCOUNTER — Encounter: Payer: Self-pay | Admitting: Physician Assistant

## 2013-06-02 VITALS — BP 128/76 | HR 98 | Temp 99.8°F | Resp 20 | Wt 186.0 lb

## 2013-06-02 DIAGNOSIS — J988 Other specified respiratory disorders: Secondary | ICD-10-CM

## 2013-06-02 DIAGNOSIS — A499 Bacterial infection, unspecified: Secondary | ICD-10-CM

## 2013-06-02 MED ORDER — LEVOFLOXACIN 750 MG PO TABS: 750.0000 mg | ORAL_TABLET | Freq: Every day | ORAL | Status: DC

## 2013-06-02 NOTE — Progress Notes (Signed)
Patient ID: [name] [name] MRN: [id number], DOB: 03-09-1946, 67 y.o. Date of Encounter: 06/02/2013, 3:49 PM    Chief Complaint:  Chief Complaint  Patient presents with  . still sick     HPI: 67 y.o. year old white female is here for followup.  She was seen by me on 05/30/13. At that time she reported that she had been sick for over one week. [place] it started with a cough then one day of sore throat. Has continued to have a lot of cough and a lot of harsh cough. Was getting out thick green mucus and phlegm. Reported having no fevers or chills and was using over-the-counter Mucinex. At that visit I prescribed a Z-Pak. Today patient states that she is taking the Z-Pak as directed. However she reports that she is feeling worse. Still is having a lot of cough.  Also, sometimes feels some pain across/ around her chest and back when she coughs. In addition she feels fullness in her left ear. Still is not blowing much from her nose. Says that what she is coughing up is now more brownish color. Still has had no significant fevers or chills.     Home Meds: See attached medication section for any medications that were entered at today's visit. The computer does not put those onto this list.The following list is a list of meds entered prior to today's visit.   Current Outpatient Prescriptions on File Prior to Visit  Medication Sig Dispense Refill  . amitriptyline (ELAVIL) 150 MG tablet Take 150 mg by mouth at bedtime.      Marland Kitchen aspirin 81 MG tablet Take 81 mg by mouth daily.      Marland Kitchen buPROPion (ZYBAN) 150 MG 12 hr tablet Take 150 mg by mouth 3 (three) times daily.      . Calcium-Magnesium-Vitamin D (CALCIUM 1200+D3 PO) Take by mouth daily.      . cetirizine (ZYRTEC) 10 MG tablet Take 10 mg by mouth daily.      . chlorzoxazone (PARAFON) 500 MG tablet Take 500 mg by mouth every 8 (eight) hours as needed for muscle spasms (1/2 to one tablet).      . diphenhydrAMINE (BENADRYL) 25 mg capsule Take 25 mg by  mouth every 6 (six) hours as needed for itching.      . fentaNYL (DURAGESIC) 12 MCG/HR Place 1 patch onto the skin every 3 (three) days.      Marland Kitchen gabapentin (NEURONTIN) 300 MG capsule Take 300 mg by mouth. 300mg  with evening meal, 300mg  at bedtime      . levothyroxine (SYNTHROID, LEVOTHROID) 50 MCG tablet TAKE ONE TABLET BY MOUTH EVERY DAY  30 tablet  1  . metoprolol succinate (TOPROL-XL) 25 MG 24 hr tablet TAKE ONE TABLET BY MOUTH EVERY DAY FOR BLOOD PRESSURE AND HEART RATE  30 tablet  9  . Multiple Vitamins-Minerals (MULTIVITAMIN PO) Take by mouth daily.      . naproxen (NAPROSYN) 500 MG tablet Take 500 mg by mouth 2 (two) times daily with a meal.      . Omega-3 Fatty Acids (FISH OIL PO) Take by mouth daily.      Marland Kitchen omeprazole (PRILOSEC) 20 MG capsule Take 20 mg by mouth daily.      . OXcarbazepine (TRILEPTAL) 300 MG/5ML suspension Take by mouth 1 day or 1 dose.      . oxyCODONE-acetaminophen (PERCOCET/ROXICET) 5-325 MG per tablet Take 1 tablet by mouth every 4 (four) hours as needed. For pain      .  Vilazodone HCl (VIIBRYD) 20 MG TABS Take 1 tablet by mouth daily.       No current facility-administered medications on file prior to visit.    Allergies:  Allergies  Allergen Reactions  . Clonazepam   . Lasix [Furosemide]     Ears, nose and throat feel like closing  . Lithium Other (See Comments)    dizziness  . Pregabalin     REACTION: diarrhea / nausea / rash  . Sulfa Antibiotics Hives  . Trazodone And Nefazodone Other (See Comments)    wakefulness      Review of Systems: See HPI for pertinent ROS. All other ROS negative.    Physical Exam: Blood pressure 128/76, pulse 98, temperature 99.8 F (37.7 C), temperature source Oral, resp. rate 20, weight 186 lb (84.369 kg), last menstrual period 08/26/1995, SpO2 97.00%., Body mass index is 31.91 kg/(m^2). General: Well nourished, well-developed white female. Appears in no acute distress. HEENT: Normocephalic, atraumatic, eyes without  discharge, sclera non-icteric, nares are without discharge. Bilateral auditory canals clear, left TM is slightly retracted and dull. There is one streak of inflamed  vessel. TM's are without perforation. Right TM is pearly grey and translucent with reflective cone of light . Oral cavity moist, posterior pharynx without exudate, erythema, peritonsillar abscess, or post nasal drip.  Neck: Supple. No thyromegaly. No lymphadenopathy. Lungs: Clear bilaterally to auscultation without wheezes, rales, or rhonchi. Breathing is unlabored. I hear no wheezes rhonchi or rales on exam. Heart: Regular rhythm. No murmurs, rubs, or gallops. Abdomen: Soft, non-tender, non-distended with normoactive bowel sounds. No hepatomegaly. No rebound/guarding. No obvious abdominal masses. Msk:  Strength and tone normal for age. Extremities/Skin: Warm and dry. No clubbing or cyanosis. No edema. No rashes or suspicious lesions. Neuro: Alert and oriented X 3. Moves all extremities spontaneously. Gait is normal. CNII-XII grossly in tact. Psych:  Responds to questions appropriately with a normal affect.     ASSESSMENT AND PLAN:  67 y.o. year old female with  1. Bacterial respiratory infection Stop the Azithromycin and start Levaquin. Continue Mucinex DM. She states that the cough is not interrupting her sleep much so we'll not prescribe a cough suppressant. Follow up if worsens or does not resolve with completion of the Levaquin. - levofloxacin (LEVAQUIN) 750 MG tablet; Take 1 tablet (750 mg total) by mouth daily.  Dispense: 7 tablet; Refill: 0   Signed, [address] [place], [place], [facility] 06/02/2013 3:49 PM

## 2013-06-02 NOTE — Telephone Encounter (Signed)
Pt was seen here on Monday for Bacterial respiratory infection, pt states she is no better, still has sore throat, states she needs something in left ear hears a roaring in it. Pt does not want to come in since seh was just here. What can she do?

## 2013-06-02 NOTE — Telephone Encounter (Signed)
I Prescribed a Z-Pak and told her to continue her Mucinex. Has only been 3 full days since her last office visit. Needs to complete the antibiotics and continue to Mucinex.

## 2013-06-02 NOTE — Telephone Encounter (Signed)
Really coughing bad esp at night.  Can have something for cough and also chest is hurting now and is very short of breath. Told pt to come to office now

## 2013-06-04 ENCOUNTER — Emergency Department (HOSPITAL_COMMUNITY): Payer: Medicare Other

## 2013-06-04 ENCOUNTER — Inpatient Hospital Stay (HOSPITAL_COMMUNITY)
Admission: EM | Admit: 2013-06-04 | Discharge: 2013-06-05 | DRG: 194 | Disposition: A | Discharge status: Home / Self Care | Payer: Medicare Other | Attending: Family Medicine | Admitting: Family Medicine

## 2013-06-04 ENCOUNTER — Encounter (HOSPITAL_COMMUNITY): Payer: Self-pay | Admitting: Emergency Medicine

## 2013-06-04 DIAGNOSIS — Z853 Personal history of malignant neoplasm of breast: Secondary | ICD-10-CM

## 2013-06-04 DIAGNOSIS — D72829 Elevated white blood cell count, unspecified: Secondary | ICD-10-CM

## 2013-06-04 DIAGNOSIS — R0602 Shortness of breath: Secondary | ICD-10-CM

## 2013-06-04 DIAGNOSIS — R000 Tachycardia, unspecified: Secondary | ICD-10-CM

## 2013-06-04 DIAGNOSIS — Z808 Family history of malignant neoplasm of other organs or systems: Secondary | ICD-10-CM

## 2013-06-04 DIAGNOSIS — Z800 Family history of malignant neoplasm of digestive organs: Secondary | ICD-10-CM

## 2013-06-04 DIAGNOSIS — E785 Hyperlipidemia, unspecified: Secondary | ICD-10-CM

## 2013-06-04 DIAGNOSIS — G894 Chronic pain syndrome: Secondary | ICD-10-CM | POA: Diagnosis present

## 2013-06-04 DIAGNOSIS — Z823 Family history of stroke: Secondary | ICD-10-CM

## 2013-06-04 DIAGNOSIS — IMO0002 Reserved for concepts with insufficient information to code with codable children: Secondary | ICD-10-CM | POA: Diagnosis present

## 2013-06-04 DIAGNOSIS — Z882 Allergy status to sulfonamides status: Secondary | ICD-10-CM

## 2013-06-04 DIAGNOSIS — F3289 Other specified depressive episodes: Secondary | ICD-10-CM | POA: Diagnosis present

## 2013-06-04 DIAGNOSIS — F411 Generalized anxiety disorder: Secondary | ICD-10-CM | POA: Diagnosis present

## 2013-06-04 DIAGNOSIS — R079 Chest pain, unspecified: Secondary | ICD-10-CM

## 2013-06-04 DIAGNOSIS — E876 Hypokalemia: Secondary | ICD-10-CM | POA: Diagnosis present

## 2013-06-04 DIAGNOSIS — Z888 Allergy status to other drugs, medicaments and biological substances status: Secondary | ICD-10-CM

## 2013-06-04 DIAGNOSIS — E669 Obesity, unspecified: Secondary | ICD-10-CM | POA: Diagnosis present

## 2013-06-04 DIAGNOSIS — K219 Gastro-esophageal reflux disease without esophagitis: Secondary | ICD-10-CM | POA: Diagnosis present

## 2013-06-04 DIAGNOSIS — I498 Other specified cardiac arrhythmias: Secondary | ICD-10-CM | POA: Diagnosis present

## 2013-06-04 DIAGNOSIS — Z833 Family history of diabetes mellitus: Secondary | ICD-10-CM

## 2013-06-04 DIAGNOSIS — E039 Hypothyroidism, unspecified: Secondary | ICD-10-CM

## 2013-06-04 DIAGNOSIS — F419 Anxiety disorder, unspecified: Secondary | ICD-10-CM

## 2013-06-04 DIAGNOSIS — H9190 Unspecified hearing loss, unspecified ear: Secondary | ICD-10-CM | POA: Diagnosis present

## 2013-06-04 DIAGNOSIS — Z818 Family history of other mental and behavioral disorders: Secondary | ICD-10-CM

## 2013-06-04 DIAGNOSIS — Z8262 Family history of osteoporosis: Secondary | ICD-10-CM

## 2013-06-04 DIAGNOSIS — N390 Urinary tract infection, site not specified: Secondary | ICD-10-CM | POA: Diagnosis present

## 2013-06-04 DIAGNOSIS — Z8249 Family history of ischemic heart disease and other diseases of the circulatory system: Secondary | ICD-10-CM

## 2013-06-04 DIAGNOSIS — J189 Pneumonia, unspecified organism: Principal | ICD-10-CM

## 2013-06-04 DIAGNOSIS — M1990 Unspecified osteoarthritis, unspecified site: Secondary | ICD-10-CM | POA: Diagnosis present

## 2013-06-04 DIAGNOSIS — F329 Major depressive disorder, single episode, unspecified: Secondary | ICD-10-CM | POA: Diagnosis present

## 2013-06-04 LAB — CBC WITH DIFFERENTIAL/PLATELET
Basophils Absolute: 0 10*3/uL (ref 0.0–0.1)
Lymphs Abs: 26.3 10*3/uL — ABNORMAL HIGH (ref 0.7–4.0)
MCH: 28.9 pg (ref 26.0–34.0)
MCHC: 33.2 g/dL (ref 30.0–36.0)
MCV: 86.9 fL (ref 78.0–100.0)
Monocytes Absolute: 2.2 10*3/uL — ABNORMAL HIGH (ref 0.1–1.0)
Platelets: 257 10*3/uL (ref 150–400)
RDW: 14.1 % (ref 11.5–15.5)

## 2013-06-04 LAB — COMPREHENSIVE METABOLIC PANEL
Alkaline Phosphatase: 196 U/L — ABNORMAL HIGH (ref 39–117)
BUN: 12 mg/dL (ref 6–23)
Calcium: 9 mg/dL (ref 8.4–10.5)
Glucose, Bld: 104 mg/dL — ABNORMAL HIGH (ref 70–99)
Potassium: 3.4 mEq/L — ABNORMAL LOW (ref 3.5–5.1)
Total Protein: 6.7 g/dL (ref 6.0–8.3)

## 2013-06-04 LAB — URINALYSIS, ROUTINE W REFLEX MICROSCOPIC
Glucose, UA: NEGATIVE mg/dL
pH: 5.5 (ref 5.0–8.0)

## 2013-06-04 LAB — URINE MICROSCOPIC-ADD ON

## 2013-06-04 LAB — POCT I-STAT TROPONIN I

## 2013-06-04 MED ORDER — SODIUM CHLORIDE 0.9 % IV BOLUS (SEPSIS)
1000.0000 mL | Freq: Once | INTRAVENOUS | Status: AC
  Administered 2013-06-04: 1000 mL via INTRAVENOUS

## 2013-06-04 NOTE — ED Provider Notes (Signed)
CSN: [id number]     Arrival date & time 06/04/13  2030 History   First MD Initiated Contact with Patient 06/04/13 2124     Chief Complaint  Patient presents with  . URI  . Sore Throat  . Cystitis   (Consider location/radiation/quality/duration/timing/severity/associated sxs/prior Treatment) HPI Comments:  Patient presents to the emergency department with chief complaint of cough, sinus infection, ear congestion, and shortness of breath.  She also states that she has had a hard time hearing due to the congestion in her ear. Patient states that she's been feeling sick for the past week or 2. She has been seen by her primary care provider, and has been prescribed Levaquin. She's been taking it as directed. He denies any nausea, vomiting, diarrhea, constipation, or dysuria. Patient states that she has noticed some hematuria.  The history is provided by the patient. No language interpreter was used.    Past Medical History  Diagnosis Date  . SOB (shortness of breath)     SOB from obesity and deconditioning 2009- eval included CPX  . Syncope 2008    no recurrence,  2008  . Breast cancer 1997    s/p lumpectomies  . Sinus tachycardia     mild resting  . Chest pain     no proven coronary disease  . HLD (hyperlipidemia)   . Orthostatic hypotension     slight in the past  . Anxiety   . Arthritis   . Depression   . Diverticulosis   . Fibromyalgia 1978  . Hypothyroidism   . Ejection fraction     EF 65%, echo, 2008  . Spinal stenosis     and DDD  . 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 Breast cancer 1997  . Alopecia   . Acid reflux   . Ruptured disk     one in neck and two in back  . DDD (degenerative disc disease)   . Plantar fasciitis    Past Surgical History  Procedure Laterality Date  . Total abdominal hysterectomy      with BSO  . Axillary node dissection      right x 2  . Breast lumpectomy      right  . Tonsillectomy    . Refractive surgery      right  .  Lumbar epidural injection      has had 7 injections   Family History  Problem Relation Age of Onset  . Heart disease Maternal Grandfather   . Colon cancer Maternal Aunt   . Other Paternal Grandmother     brain tumor  . Dementia Mother   . Diabetes Mother   . Diabetes Maternal Grandfather   . Diabetes Maternal Aunt   . Prostate cancer Father   . Brain cancer Maternal Grandmother   . Bipolar disorder Maternal Aunt   . Osteoporosis Mother   . Stroke Maternal Aunt    History  Substance Use Topics  . Smoking status: Never Smoker   . Smokeless tobacco: Never Used  . Alcohol Use: No   OB History   Grav Para Term Preterm Abortions TAB SAB Ect Mult Living   1 1        1      Review of Systems  All other systems reviewed and are negative.    Allergies  Clonazepam; Lasix; Lithium; Pregabalin; Sulfa antibiotics; and Trazodone and nefazodone  Home Medications   Current Outpatient Rx  Name  Route  Sig  Dispense  Refill  .  amitriptyline (ELAVIL) 150 MG tablet   Oral   Take 150 mg by mouth at bedtime.         Marland Kitchen aspirin 81 MG tablet   Oral   Take 81 mg by mouth daily.         Marland Kitchen BIOTIN PO   Oral   Take 1 tablet by mouth daily.         Marland Kitchen buPROPion (ZYBAN) 150 MG 12 hr tablet   Oral   Take 300 mg by mouth every morning.         . Calcium-Magnesium-Vitamin D (CALCIUM 1200+D3 PO)   Oral   Take by mouth daily.         . fentaNYL (DURAGESIC) 12 MCG/HR   Transdermal   Place 1 patch onto the skin every 3 (three) days.         Marland Kitchen gabapentin (NEURONTIN) 300 MG capsule   Oral   Take 300 mg by mouth. 300mg  with evening meal, 300mg  at bedtime         . levofloxacin (LEVAQUIN) 750 MG tablet   Oral   Take 1 tablet (750 mg total) by mouth daily.   7 tablet   0   . levothyroxine (SYNTHROID, LEVOTHROID) 50 MCG tablet      TAKE ONE TABLET BY MOUTH EVERY DAY   30 tablet   1   . metoprolol succinate (TOPROL-XL) 25 MG 24 hr tablet      TAKE ONE TABLET BY MOUTH  EVERY DAY FOR BLOOD PRESSURE AND HEART RATE   30 tablet   9   . Multiple Vitamins-Minerals (MULTIVITAMIN PO)   Oral   Take by mouth daily.         . naproxen (NAPROSYN) 500 MG tablet   Oral   Take 500 mg by mouth 2 (two) times daily with a meal.         . Omega-3 Fatty Acids (FISH OIL PO)   Oral   Take by mouth daily.         Marland Kitchen omeprazole (PRILOSEC) 20 MG capsule   Oral   Take 20 mg by mouth daily.         . OXcarbazepine (TRILEPTAL) 300 MG/5ML suspension   Oral   Take by mouth 1 day or 1 dose.         . oxyCODONE-acetaminophen (PERCOCET/ROXICET) 5-325 MG per tablet   Oral   Take 1 tablet by mouth every 4 (four) hours as needed. For pain         . Vilazodone HCl (VIIBRYD) 20 MG TABS   Oral   Take 1 tablet by mouth daily.          BP 108/60  Pulse 80  Temp(Src) 97.9 F (36.6 C) (Oral)  Resp 16  SpO2 97%  LMP 08/26/1995 Physical Exam  Nursing note and vitals reviewed. Constitutional: She is oriented to person, place, and time. She appears well-developed and well-nourished.  HENT:  Head: Normocephalic and atraumatic.  Eyes: Conjunctivae and EOM are normal. Pupils are equal, round, and reactive to light.  Neck: Normal range of motion. Neck supple.  Cardiovascular: Normal rate and regular rhythm.  Exam reveals no gallop and no friction rub.   No murmur heard. Pulmonary/Chest: Effort normal and breath sounds normal. No respiratory distress. She has no wheezes. She has no rales. She exhibits no tenderness.  Abdominal: Soft. Bowel sounds are normal. She exhibits no distension and no mass. There is no tenderness. There is  no rebound and no guarding.  Musculoskeletal: Normal range of motion. She exhibits no edema and no tenderness.  Neurological: She is alert and oriented to person, place, and time.  Skin: Skin is warm and dry.  Psychiatric: She has a normal mood and affect. Her behavior is normal. Judgment and thought content normal.    ED Course   Procedures (including critical care time) Labs Review Labs Reviewed  URINALYSIS, ROUTINE W REFLEX MICROSCOPIC - Abnormal; Notable for the following:    APPearance CLOUDY (*)    Hgb urine dipstick LARGE (*)    Bilirubin Urine MODERATE (*)    Ketones, ur 15 (*)    Protein, ur 30 (*)    Leukocytes, UA SMALL (*)    All other components within normal limits  URINE MICROSCOPIC-ADD ON - Abnormal; Notable for the following:    Casts HYALINE CASTS (*)    All other components within normal limits  CBC WITH DIFFERENTIAL  COMPREHENSIVE METABOLIC PANEL   Imaging Review Dg Chest 2 View  06/04/2013   *RADIOLOGY REPORT*  Clinical Data: Chest pain, congestion, cough  CHEST - 2 VIEW  Comparison: Prior radiograph from 04/13/2013  Findings: Cardiac and mediastinal silhouettes are stable in size and contour, and remain within normal limits.  The lungs are normally inflated.  There is patchy parenchymal airspace opacity within the posterior left lower lobe, most consistent with pneumonia.  No pulmonary edema.  There is question of a small associated left parapneumonic effusion.  No pneumothorax.  The surgical clips overlie the right axilla.  No acute osseous abnormality.  IMPRESSION: Parenchymal opacity within the posterior left lower lobe, most consistent with pneumonia.   Original Report Authenticated By: Rise Mu, M.D.    EKG Interpretation   None     ED ECG REPORT  I personally interpreted this EKG   Date: 06/05/2013   Rate:84  Rhythm: normal sinus rhythm  QRS Axis: normal  Intervals: normal  ST/T Wave abnormalities: normal  Conduction Disutrbances:none  Narrative Interpretation:   Old EKG Reviewed: unchanged    MDM   1. CAP (community acquired pneumonia)    Patient with community-acquired pneumonia, likely failing outpatient treatment, she has been treated with Levaquin, and another antibiotic in the past 2 weeks. She states that her symptoms are still persistent. She also  endorses dysuria. She has a very high white count, 43. Will admit the patient to unassigned internal medicine. Patient understands and agrees to plan. Patient discussed with Dr. Effie Shy, who also agrees with the plan.    Roxy Horseman, PA-C 06/05/13 0021

## 2013-06-04 NOTE — ED Notes (Signed)
Resp. 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 Taking mucinex and levofloxacin. an Going to bathroom frequently.

## 2013-06-04 NOTE — ED Notes (Signed)
Pt given 1/4 cup of ice water OK by [employer]

## 2013-06-05 DIAGNOSIS — R079 Chest pain, unspecified: Secondary | ICD-10-CM

## 2013-06-05 DIAGNOSIS — I498 Other specified cardiac arrhythmias: Secondary | ICD-10-CM

## 2013-06-05 DIAGNOSIS — R0602 Shortness of breath: Secondary | ICD-10-CM

## 2013-06-05 DIAGNOSIS — J189 Pneumonia, unspecified organism: Principal | ICD-10-CM

## 2013-06-05 DIAGNOSIS — D72829 Elevated white blood cell count, unspecified: Secondary | ICD-10-CM

## 2013-06-05 DIAGNOSIS — E039 Hypothyroidism, unspecified: Secondary | ICD-10-CM

## 2013-06-05 DIAGNOSIS — E785 Hyperlipidemia, unspecified: Secondary | ICD-10-CM

## 2013-06-05 DIAGNOSIS — F411 Generalized anxiety disorder: Secondary | ICD-10-CM

## 2013-06-05 LAB — CBC
Hemoglobin: 9.4 g/dL — ABNORMAL LOW (ref 12.0–15.0)
MCH: 28.6 pg (ref 26.0–34.0)
MCHC: 33.1 g/dL (ref 30.0–36.0)
RDW: 14.1 % (ref 11.5–15.5)

## 2013-06-05 LAB — DIFFERENTIAL
Basophils Relative: 0 % (ref 0–1)
Eosinophils Relative: 1 % (ref 0–5)
Lymphocytes Relative: 68 % — ABNORMAL HIGH (ref 12–46)
Lymphs Abs: 24.9 10*3/uL — ABNORMAL HIGH (ref 0.7–4.0)
Monocytes Absolute: 1.8 10*3/uL — ABNORMAL HIGH (ref 0.1–1.0)
Monocytes Relative: 5 % (ref 3–12)
Neutro Abs: 9.2 10*3/uL — ABNORMAL HIGH (ref 1.7–7.7)
Neutrophils Relative %: 26 % — ABNORMAL LOW (ref 43–77)

## 2013-06-05 LAB — BASIC METABOLIC PANEL
BUN: 10 mg/dL (ref 6–23)
CO2: 19 mEq/L (ref 19–32)
GFR calc non Af Amer: >90 mL/min (ref >90)
Glucose, Bld: 98 mg/dL (ref 70–99)
Potassium: 3 mEq/L — ABNORMAL LOW (ref 3.5–5.1)

## 2013-06-05 MED ORDER — ASPIRIN 81 MG PO CHEW
81.0000 mg | CHEWABLE_TABLET | Freq: Every day | ORAL | Status: DC
  Administered 2013-06-05: 81 mg via ORAL
  Filled 2013-06-05: qty 1

## 2013-06-05 MED ORDER — LEVOTHYROXINE SODIUM 50 MCG PO TABS
50.0000 ug | ORAL_TABLET | Freq: Every day | ORAL | Status: DC
  Administered 2013-06-05: 50 ug via ORAL
  Filled 2013-06-05 (×2): qty 1

## 2013-06-05 MED ORDER — VILAZODONE HCL 20 MG PO TABS
1.0000 | ORAL_TABLET | Freq: Every day | ORAL | Status: DC
  Administered 2013-06-05: 20 mg via ORAL
  Filled 2013-06-05: qty 1

## 2013-06-05 MED ORDER — AMOXICILLIN-POT CLAVULANATE 875-125 MG PO TABS: 1.0000 | ORAL_TABLET | Freq: Two times a day (BID) | ORAL | Status: DC

## 2013-06-05 MED ORDER — GABAPENTIN 300 MG PO CAPS
300.0000 mg | ORAL_CAPSULE | ORAL | Status: DC
  Administered 2013-06-05: 300 mg via ORAL
  Filled 2013-06-05 (×2): qty 1

## 2013-06-05 MED ORDER — DEXTROSE 5 % IV SOLN
1.0000 g | Freq: Every day | INTRAVENOUS | Status: DC
  Administered 2013-06-05: 1 g via INTRAVENOUS
  Filled 2013-06-05 (×2): qty 10

## 2013-06-05 MED ORDER — CALCIUM CARBONATE-VITAMIN D 500-200 MG-UNIT PO TABS
1.0000 | ORAL_TABLET | Freq: Every day | ORAL | Status: DC
  Administered 2013-06-05: 08:00:00 1 via ORAL
  Filled 2013-06-05 (×2): qty 1

## 2013-06-05 MED ORDER — ENOXAPARIN SODIUM 40 MG/0.4ML ~~LOC~~ SOLN
40.0000 mg | SUBCUTANEOUS | Status: DC
  Administered 2013-06-05: 40 mg via SUBCUTANEOUS
  Filled 2013-06-05: qty 0.4

## 2013-06-05 MED ORDER — OXYCODONE-ACETAMINOPHEN 5-325 MG PO TABS: 1.0000 | ORAL_TABLET | ORAL | Status: DC | PRN

## 2013-06-05 MED ORDER — NAPROXEN 500 MG PO TABS
500.0000 mg | ORAL_TABLET | Freq: Two times a day (BID) | ORAL | Status: DC
  Administered 2013-06-05 (×2): 500 mg via ORAL
  Filled 2013-06-05 (×3): qty 1

## 2013-06-05 MED ORDER — OXCARBAZEPINE 300 MG/5ML PO SUSP
150.0000 mg | ORAL | Status: DC
  Administered 2013-06-05: 150 mg via ORAL
  Filled 2013-06-05: qty 2.5

## 2013-06-05 MED ORDER — PANTOPRAZOLE SODIUM 40 MG PO TBEC
40.0000 mg | DELAYED_RELEASE_TABLET | Freq: Every day | ORAL | Status: DC
  Administered 2013-06-05: 40 mg via ORAL

## 2013-06-05 MED ORDER — BUPROPION HCL ER (SR) 150 MG PO TB12
300.0000 mg | ORAL_TABLET | ORAL | Status: DC
Start: 2013-06-05 — End: 2013-06-05
  Administered 2013-06-05: 300 mg via ORAL
  Filled 2013-06-05 (×2): qty 2

## 2013-06-05 MED ORDER — BENZONATATE 100 MG PO CAPS: 100.0000 mg | ORAL_CAPSULE | Freq: Three times a day (TID) | ORAL | Status: DC | PRN

## 2013-06-05 MED ORDER — ALBUTEROL SULFATE HFA 108 (90 BASE) MCG/ACT IN AERS
2.0000 | INHALATION_SPRAY | Freq: Four times a day (QID) | RESPIRATORY_TRACT | Status: DC | PRN
  Filled 2013-06-05: qty 6.7

## 2013-06-05 MED ORDER — PHENOL 1.4 % MT LIQD: 1.0000 | OROMUCOSAL | Status: DC | PRN

## 2013-06-05 MED ORDER — INFLUENZA VAC SPLIT QUAD 0.5 ML IM SUSP: 0.5000 mL | INTRAMUSCULAR | Status: DC

## 2013-06-05 MED ORDER — AMITRIPTYLINE HCL 75 MG PO TABS
150.0000 mg | ORAL_TABLET | Freq: Every day | ORAL | Status: DC
  Administered 2013-06-05: 150 mg via ORAL
  Filled 2013-06-05 (×2): qty 2

## 2013-06-05 MED ORDER — METOPROLOL SUCCINATE ER 25 MG PO TB24
25.0000 mg | ORAL_TABLET | Freq: Every day | ORAL | Status: DC
  Administered 2013-06-05: 25 mg via ORAL
  Filled 2013-06-05: qty 1

## 2013-06-05 MED ORDER — CALCIUM-MAGNESIUM-VITAMIN D ER 600-40-500 MG-MG-UNIT PO TB24: 1.0000 | ORAL_TABLET | Freq: Every day | ORAL | Status: DC

## 2013-06-05 MED ORDER — SODIUM CHLORIDE 0.45 % IV SOLN
INTRAVENOUS | Status: AC
  Administered 2013-06-05: 02:00:00 via INTRAVENOUS

## 2013-06-05 NOTE — Discharge Summary (Signed)
Family Medicine Teaching [facility] Discharge Summary  Patient name: [name] [name] Medical record number: [id number] Date of birth: 08-13-1946 Age: 67 y.o. Gender: female Date of Admission: 06/04/2013  Date of Discharge: 06/05/13 Admitting Physician: Uvaldo Rising, MD  Primary Care Provider: Leo Grosser, MD Consultants: none  Indication for Hospitalization: CAP failed therapy on Azithro and Levaquin  Discharge Diagnoses/Problem List:  CAP Leukosytosis UTI Anemia Depression/anxiety Hypothyroidism DDD  Disposition: Home  Discharge Condition: Excellent  Discharge Exam:  BP 108/60  Pulse 80  Temp(Src) 97.9 F (36.6 C) (Oral)  Resp 16  SpO2 97%  LMP 08/26/1995  Exam:  General: NAD, lying in bed  HEENT:  sclera clear, EOMI, , o/p clear with no erythema or exudate, nares patent, dry mucus membranes  Cardiovascular: RRR, no m/r/g, 2+ bilateral radial pulses  Respiratory: mild Left mid-lower lobe crackles, normal effort  Abdomen: Soft, mild right-sided tenderness, nondistended, obese, no suprapubic tenderness  Extremities: Moves all extremities equally and spontaneously ,  Skin: No rash or cyanosis  Neuro: Awake, alert, anxious-appearing, no focal deficits, normal speech, CN 2-12 grossly intact   Brief Hospital Course: [name] [name] is a 67 y.o. female presenting with continued shortness of breath and cough . PMH is significant for obesity, breast cancer s/p lumpectomies, sinus tachycardia, HLD, anxiety, DJD, and fibromyalgia.  #CAP: AFVSS on admission w/ WBC of 43. CXR concerning for PNA. Previous to admission, treated w/ Azithro (5days) then levaquin (3 days). Clinically stable and well at time of DC. Of note there is concern for leukemia which may be contributing to pts inability to clear PNA. No respiratory compromise and not O2 requirement. Given CTX during admission (also covering possible UTI) Pt DC on augmentin. BCX negative during admission. PT to f/u w/  PCP 24 hrs after DC.  # Heme: WBC to 43 w/ 61% lymphs noted on admission. Pt fairly asymptomatic otherwise given white count. Heme consulted who asked for peripheral smear to be performed and to have pt f/u outpt. 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 This was fairly distressing for pt but reassurance given and amenable to f/u w/ Dr. Arbutus Ped w/in 1-2 wks after DC. Of note pt anemic, so all cell lines not affected.   # Electrolytes: K of 3.4 on admission. Repleated.  # Anxiety/Depression: pts home medications of Bupropion, elevil, Vilazodone all continued during admission. Pt very anxious over possible CLL Dx. No signs of depression/mania  # UTI: questionable UTI on admission based on UA only. Treating PNA w/ CTX which would also cover UTI during admission.   Issues for Follow Up:  Leukocytosis - H/O team at [facility]. (Dr. Arbutus Ped)  CAP  Significant Procedures: none  Significant Labs and Imaging:   Recent Labs Lab 06/04/13 2221 06/05/13 0420  WBC 43.2* 36.1*  HGB 10.1* 9.4*  HCT 30.4* 28.4*  PLT 257 241    Recent Labs Lab 06/04/13 2221 06/05/13 0420  NA 135 137  K 3.4* 3.0*  CL 97 101  CO2 22 19  GLUCOSE 104* 98  BUN 12 10  CREATININE 0.70 0.64  CALCIUM 9.0 8.5  ALKPHOS 196*  --   AST 30  --   ALT 45*  --   ALBUMIN 2.6*  --    Dg Chest 2 View  06/04/2013   *RADIOLOGY REPORT*  Clinical Data: Chest pain, congestion, cough  CHEST - 2 VIEW  Comparison: Prior radiograph from 04/13/2013  Findings: Cardiac and mediastinal silhouettes are stable in size and contour, and remain within normal limits.  The lungs are normally inflated.  There is patchy parenchymal airspace opacity within the posterior left lower lobe, most consistent with pneumonia.  No pulmonary edema.  There is question of a small associated left parapneumonic effusion.  No pneumothorax.  The surgical clips overlie the right axilla.  No acute osseous abnormality.  IMPRESSION: Parenchymal opacity within the posterior left lower lobe, most  consistent with pneumonia.   Original Report Authenticated By: Rise Mu, M.D.    Results/Tests Pending at Time of Discharge: none  Discharge Medications:    Medication List    ASK your doctor about these medications       amitriptyline 150 MG tablet  Commonly known as:  ELAVIL  Take 150 mg by mouth at bedtime.     aspirin 81 MG tablet  Take 81 mg by mouth daily.     BIOTIN PO  Take 1 tablet by mouth daily.     buPROPion 150 MG 12 hr tablet  Commonly known as:  ZYBAN  Take 300 mg by mouth every morning.     CALCIUM 1200+D3 PO  Take by mouth daily.     DURAGESIC 12 MCG/HR  Generic drug:  fentaNYL  Place 1 patch onto the skin every 3 (three) days.     FISH OIL PO  Take by mouth daily.     gabapentin 300 MG capsule  Commonly known as:  NEURONTIN  Take 300 mg by mouth. 300mg  with evening meal, 300mg  at bedtime     levofloxacin 750 MG tablet  Commonly known as:  LEVAQUIN  Take 1 tablet (750 mg total) by mouth daily.     levothyroxine 50 MCG tablet  Commonly known as:  SYNTHROID, LEVOTHROID  TAKE ONE TABLET BY MOUTH EVERY DAY     metoprolol succinate 25 MG 24 hr tablet  Commonly known as:  TOPROL-XL  TAKE ONE TABLET BY MOUTH EVERY DAY FOR BLOOD PRESSURE AND HEART RATE     MULTIVITAMIN PO  Take by mouth daily.     naproxen 500 MG tablet  Commonly known as:  NAPROSYN  Take 500 mg by mouth 2 (two) times daily with a meal.     omeprazole 20 MG capsule  Commonly known as:  PRILOSEC  Take 20 mg by mouth daily.     OXcarbazepine 300 MG/5ML suspension  Commonly known as:  TRILEPTAL  Take by mouth 1 day or 1 dose.     oxyCODONE-acetaminophen 5-325 MG per tablet  Commonly known as:  PERCOCET/ROXICET  Take 1 tablet by mouth every 4 (four) hours as needed. For pain     VIIBRYD 20 MG Tabs  Generic drug:  Vilazodone HCl  Take 1 tablet by mouth daily.        Discharge Instructions: Please refer to Patient Instructions section of EMR for full details.   Patient was counseled important signs and symptoms that should prompt return to medical care, changes in medications, dietary instructions, activity restrictions, and follow up appointments.   Follow-Up Appointments:   Ozella Rocks, MD 06/05/2013, 5:39 PM PGY-3, [facility]

## 2013-06-05 NOTE — H&P (Addendum)
FMTS Attending Note  I personally saw and evaluated the patient. The plan of care was discussed with the resident team. I agree with the assessment and plan as documented by the resident.   67 y/o female with PMH obesity, breast cancer, HLD, anxiety presents with increasing sob, patient failed outpatient management with Azithro/Levaquin, her breathing is improved with AM however does have epigastric/substernal heaviness, this is chronic for her, no radiation, no associated diaphoresis, no associated dizziness/lightheadedness, seen in January by Dr. Myrtis Ser (cardiology), last cardiac stress test was 3-4 years ago at outside facility.  Vitals: reviewed Gen: pleasant caucasian female, NAD, laying flat in bed, RA Cardiac: RRR, S1 and S2 present, no murmurs, no heaves/thrills Resp: LLL crackes, normal effort, good air entry bilaterally Abd: soft, normal bowels sounds, no tenderness Ext: no edema  1. CAP - agree with Rocephin with transition to PO antibiotics as discharge 2. Leukocytosis with lymphocytosis - check peripheral smear as this is atypical for CAP, will consult hemeonc as this may represent leukemia/lymphoma 3. Chest discomfort - recheck troponin/EKG, initial cardiac workup negative, if repeat testing is negative will have patient follow up with cardiology for further evaluation as outpatient.  4. Other chronic medical conditions stable as documented by resident.   Donnella Sham MD

## 2013-06-05 NOTE — Progress Notes (Signed)
Utilization Review Completed.[name] [name] T10/07/2013  

## 2013-06-05 NOTE — H&P (Signed)
Family Medicine Teaching [facility] Admission History and Physical Service Pager: [phone]  Patient name: [name] [name] Medical record number: [id number] Date of birth: 1946/04/09 Age: 67 y.o. Gender: female  Primary Care Provider: Leo Grosser, MD [name] - admitting as UA Consultants: None Code Status: Full Code per discussion with patient and partner  Chief Complaint: Continued cough and shortness of breath   Assessment and Plan: [name] [name] is a 67 y.o. female presenting with continued shortness of breath and cough . PMH is significant for obesity, breast cancer s/p lumpectomies, sinus tachycardia, HLD, anxiety, DJD, and fibromyalgia.  # CAP - AFVSS, WBC 43.2, CXR showing likely LLL pneumonia in patient with unremitting symptoms x 2 weeks and positive o/p group A strep culture earlier this month. Failed azithromycin 5 days making atypical H flu unlikely, did not get better on levaquin 3 days. POC troponin negative.  - Admit to inpatient [name] Teaching Service, attending Dr. Randolm Idol - IV ceftiraxone for likely streptococcal coverage for CAP; will not re-rx azithromycin (tx failure, unlikely to be atypical organism); will not continue levaquin given unlikely it will help (more likely strep than gram neg or pseudomonas). No MRSA coverage as it would be unlikely that this CAP is MRSA. - chloraseptic spray prn pain - BCx x 2 and AM CBC - Consider otitis media which would also be treated by ceftriaxone - If no improvement with treatment of adequate duration, consider neoplasia, multilobar pna, MRSA, legionella, or abx resistant pathogen - PRN oxygen, incentive spirometry  # UTI - Though UA appears more dehydrated/possibly traumatic with only rare bacteria and small leuks, will tx as UTI in setting of such elevated serum WBC, urinary frequency, and h/o passing kidney stone 07/2012. H/o coagulase negative staph UTI in the past - IV ceftriaxone -  Urine culture  # Anemia - Hgb 10.1, MCV WNL - AM CBC - Consider FOBT and look into colonoscopy hx  # Hypokalemia - Mild at 3.4 - BMET AM  # Sinus tachycardia - Saw Dr [name] [name] with cardiology 08/2012; Vitals currently stable and no cv history - Continue toprol XL 25mg  daily - Monitor  # Anxiety/depressive symptoms - continuing home bupropion 300mg  daily, elavil 150mg  QHS, and vilazodone 20mg  daily  # Hypothyroidism - Continue home synthroid  # Degenerative disk disease - Cotninue home gabapentin 300mg  QHS, naproxen 500mg  BID, percocet 5-325mg  q4 hours prn, and trileptal (uncertain of dose, ordered 150mg  daily due to pt report of being off balance at times)  FEN/GI: Regular diet though encouraged pt to take it slow, 1/2 NS at 100cc/hr x 12 hours Prophylaxis: Lovenox, PPI, aspirin 81mg  daily  Disposition: Admitting to med-surg, discharge pending clinical improvement of pneumonia  History of Present Illness: [name] is a 67 y.o. female presenting with cough, sore throat, chest pain and shortness of breath. She reports 2 weeks of symptoms for which she presented to her PCP. She was prescribed azithromycin 5 day course which did not help. She was then started on levaquin 10/9 but has still not noticed any improvement. Reports fever to 102 (or 100.2, pt not sure) F, chest pain, dyspnea, cough and congestion, and now inability to hear out of left ear (already deaf in right ear). Denies fever, neck stiffness, body soreness more than the regular, diarrhea, or emesis. However, she has had no appetite for 4 days. Pt also reports increased urinary frequency. Denies sick contacts other than husband who recently had an ear infection, new medications  other than gabapentin. Nothing has improved her symptoms. She is very anxious about her hearing with this acute illness.  In the ED, patient had leukocytosis to 40s with 61% lymphocytes, chest xray showed posterior LLL pneumonia. Patient was given 1L  bolus.   Review Of Systems: Per HPI with the following additions: None Otherwise 12 point review of systems was performed and was unremarkable.  Patient Active Problem List   Diagnosis Date Noted  . 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 Chronic pain syndrome 05/10/2013  . H/O long-term treatment with high-risk medication 05/10/2013  . DDD (degenerative disc disease) 05/10/2013  . SOB (shortness of breath)   . Syncope   . Breast cancer   . Sinus tachycardia   . Chest pain   . HLD (hyperlipidemia)   . Orthostatic hypotension   . Anxiety   . Arthritis   . Diverticulosis   . Fibromyalgia   . Hypothyroidism   . Ejection fraction    Past Medical History: Past Medical History  Diagnosis Date  . SOB (shortness of breath)     SOB from obesity and deconditioning 2009- eval included CPX  . Syncope 2008    no recurrence,  2008  . Breast cancer 1997    s/p lumpectomies  . Sinus tachycardia     mild resting  . Chest pain     no proven coronary disease  . HLD (hyperlipidemia)   . Orthostatic hypotension     slight in the past  . Anxiety   . Arthritis   . Depression   . Diverticulosis   . Fibromyalgia 1978  . Hypothyroidism   . Ejection fraction     EF 65%, echo, 2008  . Spinal stenosis     and DDD  . 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 Breast cancer 1997  . Alopecia   . Acid reflux   . Ruptured disk     one in neck and two in back  . DDD (degenerative disc disease)   . Plantar fasciitis    Past Surgical History: Past Surgical History  Procedure Laterality Date  . Total abdominal hysterectomy      with BSO  . Axillary node dissection      right x 2  . Breast lumpectomy      right  . Tonsillectomy    . Refractive surgery      right  . Lumbar epidural injection      has had 7 injections   Social History: History  Substance Use Topics  . Smoking status: Never Smoker   . Smokeless tobacco: Never Used  . Alcohol Use: No   Additional social  history:  Not a smoker, not sexually active, no h/o drug use Please also refer to relevant sections of EMR.  Family History: Family History  Problem Relation Age of Onset  . Heart disease Maternal Grandfather   . Colon cancer Maternal Aunt   . Other Paternal Grandmother     brain tumor  . Dementia Mother   . Diabetes Mother   . Diabetes Maternal Grandfather   . Diabetes Maternal Aunt   . Prostate cancer Father   . Brain cancer Maternal Grandmother   . Bipolar disorder Maternal Aunt   . Osteoporosis Mother   . Stroke Maternal Aunt    Allergies and Medications: Allergies  Allergen Reactions  . Clonazepam   . Lasix [Furosemide]     Ears, nose and throat feel like closing  . Lithium Other (See  Comments)    dizziness  . Pregabalin     REACTION: diarrhea / nausea / rash  . Sulfa Antibiotics Hives  . Trazodone And Nefazodone Other (See Comments)    wakefulness   No current facility-administered medications on file prior to encounter.   Current Outpatient Prescriptions on File Prior to Encounter  Medication Sig Dispense Refill  . amitriptyline (ELAVIL) 150 MG tablet Take 150 mg by mouth at bedtime.      Marland Kitchen aspirin 81 MG tablet Take 81 mg by mouth daily.      . Calcium-Magnesium-Vitamin D (CALCIUM 1200+D3 PO) Take by mouth daily.      . fentaNYL (DURAGESIC) 12 MCG/HR Place 1 patch onto the skin every 3 (three) days.      Marland Kitchen gabapentin (NEURONTIN) 300 MG capsule Take 300 mg by mouth. 300mg  with evening meal, 300mg  at bedtime      . levofloxacin (LEVAQUIN) 750 MG tablet Take 1 tablet (750 mg total) by mouth daily.  7 tablet  0  . levothyroxine (SYNTHROID, LEVOTHROID) 50 MCG tablet TAKE ONE TABLET BY MOUTH EVERY DAY  30 tablet  1  . metoprolol succinate (TOPROL-XL) 25 MG 24 hr tablet TAKE ONE TABLET BY MOUTH EVERY DAY FOR BLOOD PRESSURE AND HEART RATE  30 tablet  9  . Multiple Vitamins-Minerals (MULTIVITAMIN PO) Take by mouth daily.      . naproxen (NAPROSYN) 500 MG tablet Take 500  mg by mouth 2 (two) times daily with a meal.      . Omega-3 Fatty Acids (FISH OIL PO) Take by mouth daily.      Marland Kitchen omeprazole (PRILOSEC) 20 MG capsule Take 20 mg by mouth daily.      . OXcarbazepine (TRILEPTAL) 300 MG/5ML suspension Take by mouth 1 day or 1 dose.      . oxyCODONE-acetaminophen (PERCOCET/ROXICET) 5-325 MG per tablet Take 1 tablet by mouth every 4 (four) hours as needed. For pain      . Vilazodone HCl (VIIBRYD) 20 MG TABS Take 1 tablet by mouth daily.        Objective: BP 108/60  Pulse 80  Temp(Src) 97.9 F (36.6 C) (Oral)  Resp 16  SpO2 97%  LMP 08/26/1995 Exam: General: NAD, lying in bed HEENT: AT/Lee, sclera clear, EOMI, PERRL, o/p clear with no erythema or exudate, nares patent, TMs with mild erythema bilaterally and slightly retracted with left with small amount of fluid behind TM, possibly purulent, no frontal, maxillary, or mastoid tenderness, very dry mucus membranes Cardiovascular: RRR, no m/r/g, 2+ bilateral radial pulses Respiratory: Left mid-lower lobe crackles, normal effort Abdomen: Soft, mild right-sided tenderness, nondistended, obese, no suprapubic tenderness Extremities: Moves all extremities equally and spontaneously Skin: No rash or cyanosis Neuro: Awake, alert, anxious-appearing, no focal deficits, normal speech  Labs and Imaging: CBC BMET   Recent Labs Lab 06/04/13 2221  WBC 43.2*  HGB 10.1*  HCT 30.4*  PLT 257    Recent Labs Lab 06/04/13 2221  NA 135  K 3.4*  CL 97  CO2 22  BUN 12  CREATININE 0.70  GLUCOSE 104*  CALCIUM 9.0     DG Chest 2 view: IMPRESSION:  Parenchymal opacity within the posterior left lower lobe, most  consistent with pneumonia.   EKG - Abnormal R wave progression with early transition  Urinalysis    Component Value Date/Time   COLORURINE YELLOW 06/04/2013 2129   APPEARANCEUR CLOUDY* 06/04/2013 2129   LABSPEC 1.020 06/04/2013 2129   PHURINE 5.5 06/04/2013 2129  GLUCOSEU NEGATIVE 06/04/2013 2129    HGBUR LARGE* 06/04/2013 2129   BILIRUBINUR MODERATE* 06/04/2013 2129   KETONESUR 15* 06/04/2013 2129   PROTEINUR 30* 06/04/2013 2129   UROBILINOGEN 0.2 06/04/2013 2129   NITRITE NEGATIVE 06/04/2013 2129   LEUKOCYTESUR SMALL* 06/04/2013 2129    10/6 GAS positive throat culture    Leona Singleton, MD 06/05/2013, 12:21 AM PGY-2, [facility] FPTS Intern pager: [phone], text pages welcome

## 2013-06-05 NOTE — ED Provider Notes (Signed)
  Face-to-face evaluation   History: She presents for evaluation of decreased hearing, left ear, cough, shortness of breath, weakness. Symptoms are gradually worse over the last week, despite taking antibiotics. She also has urinary frequency  Physical exam: Alert, elderly, female, who appears older than her stated age. Respiratory no distress. She is hard of hearing  Medical screening examination/treatment/procedure(s) were conducted as a shared visit with non-physician practitioner(s) and myself.  I personally evaluated the patient during the encounter  Flint Melter, MD 06/05/13 0028

## 2013-06-06 LAB — URINE CULTURE
Colony Count: NO GROWTH
Culture: NO GROWTH

## 2013-06-06 LAB — PATHOLOGIST SMEAR REVIEW

## 2013-06-08 ENCOUNTER — Telehealth: Payer: Self-pay | Admitting: Oncology

## 2013-06-08 ENCOUNTER — Telehealth: Payer: Self-pay | Admitting: Family Medicine

## 2013-06-08 NOTE — Telephone Encounter (Signed)
Pt husband called to schedule np appt pt was seen in ER per provider was told to f/u with a hematologist.  Gave np appt 10/24 @ 1:30 w/Dr. Clelia Croft Dx- Leukocytosis w/Lymphocytosis Welcome packet mailed.

## 2013-06-08 NOTE — Telephone Encounter (Signed)
Patient and husband concerned over recent hospitalization and possible diagnosis of Leukemia.  Has appt next week with Hem/Onc at [facility].  She thought she should be seen here and have blood tests repeated, per advise of family member.  Told patient, per provider recommendation, that visit here really not necessary.  It would only be added expense.  Has appt next week with Hematologist and we recommend that she keep that appt.  He will repeat all the lab work then again anyway.  Despite anything that we may do.  Still sick with URI/Pneumonia.  Told her she needs to rest, drink fluids and continue all medications as ordered when left hospital.  Tried to reassure her not to worry until she sees specialist and gets final diagnosis.

## 2013-06-08 NOTE — Telephone Encounter (Signed)
C/D 06/08/13 for appt. 06/17/13

## 2013-06-08 NOTE — Telephone Encounter (Signed)
C/D 06/08/13 for appt. 06/17/13 °

## 2013-06-10 ENCOUNTER — Encounter (HOSPITAL_COMMUNITY): Payer: Self-pay | Admitting: Emergency Medicine

## 2013-06-10 ENCOUNTER — Other Ambulatory Visit: Payer: Self-pay | Admitting: Oncology

## 2013-06-10 ENCOUNTER — Emergency Department (HOSPITAL_COMMUNITY): Payer: Medicare Other

## 2013-06-10 ENCOUNTER — Telehealth: Payer: Self-pay | Admitting: Physician Assistant

## 2013-06-10 ENCOUNTER — Emergency Department (HOSPITAL_COMMUNITY)
Admission: EM | Admit: 2013-06-10 | Discharge: 2013-06-10 | Disposition: A | Discharge status: Home / Self Care | Payer: Medicare Other | Attending: Emergency Medicine | Admitting: Emergency Medicine

## 2013-06-10 DIAGNOSIS — Z853 Personal history of malignant neoplasm of breast: Secondary | ICD-10-CM | POA: Insufficient documentation

## 2013-06-10 DIAGNOSIS — E039 Hypothyroidism, unspecified: Secondary | ICD-10-CM | POA: Insufficient documentation

## 2013-06-10 DIAGNOSIS — Z79899 Other long term (current) drug therapy: Secondary | ICD-10-CM | POA: Insufficient documentation

## 2013-06-10 DIAGNOSIS — D72820 Lymphocytosis (symptomatic): Secondary | ICD-10-CM

## 2013-06-10 DIAGNOSIS — D72829 Elevated white blood cell count, unspecified: Secondary | ICD-10-CM

## 2013-06-10 DIAGNOSIS — Z8679 Personal history of other diseases of the circulatory system: Secondary | ICD-10-CM | POA: Insufficient documentation

## 2013-06-10 DIAGNOSIS — M542 Cervicalgia: Secondary | ICD-10-CM | POA: Insufficient documentation

## 2013-06-10 DIAGNOSIS — K219 Gastro-esophageal reflux disease without esophagitis: Secondary | ICD-10-CM | POA: Insufficient documentation

## 2013-06-10 DIAGNOSIS — R0602 Shortness of breath: Secondary | ICD-10-CM

## 2013-06-10 DIAGNOSIS — Z872 Personal history of diseases of the skin and subcutaneous tissue: Secondary | ICD-10-CM | POA: Insufficient documentation

## 2013-06-10 DIAGNOSIS — F329 Major depressive disorder, single episode, unspecified: Secondary | ICD-10-CM | POA: Insufficient documentation

## 2013-06-10 DIAGNOSIS — M129 Arthropathy, unspecified: Secondary | ICD-10-CM | POA: Insufficient documentation

## 2013-06-10 DIAGNOSIS — F3289 Other specified depressive episodes: Secondary | ICD-10-CM | POA: Insufficient documentation

## 2013-06-10 DIAGNOSIS — H9209 Otalgia, unspecified ear: Secondary | ICD-10-CM | POA: Insufficient documentation

## 2013-06-10 DIAGNOSIS — F411 Generalized anxiety disorder: Secondary | ICD-10-CM | POA: Insufficient documentation

## 2013-06-10 DIAGNOSIS — J159 Unspecified bacterial pneumonia: Secondary | ICD-10-CM | POA: Insufficient documentation

## 2013-06-10 DIAGNOSIS — J189 Pneumonia, unspecified organism: Secondary | ICD-10-CM

## 2013-06-10 DIAGNOSIS — Z7982 Long term (current) use of aspirin: Secondary | ICD-10-CM | POA: Insufficient documentation

## 2013-06-10 LAB — CBC WITH DIFFERENTIAL/PLATELET
Basophils Relative: 0 % (ref 0–1)
Eosinophils Relative: 0 % (ref 0–5)
HCT: 33.5 % — ABNORMAL LOW (ref 36.0–46.0)
Hemoglobin: 10.7 g/dL — ABNORMAL LOW (ref 12.0–15.0)
Lymphocytes Relative: 70 % — ABNORMAL HIGH (ref 12–46)
Lymphs Abs: 33.3 10*3/uL — ABNORMAL HIGH (ref 0.7–4.0)
MCH: 28.2 pg (ref 26.0–34.0)
MCHC: 31.9 g/dL (ref 30.0–36.0)
MCV: 88.4 fL (ref 78.0–100.0)
Monocytes Absolute: 1.9 10*3/uL — ABNORMAL HIGH (ref 0.1–1.0)
Platelets: 364 10*3/uL (ref 150–400)
RBC: 3.79 MIL/uL — ABNORMAL LOW (ref 3.87–5.11)

## 2013-06-10 LAB — BASIC METABOLIC PANEL
BUN: 8 mg/dL (ref 6–23)
CO2: 24 mEq/L (ref 19–32)
Chloride: 105 mEq/L (ref 96–112)
Creatinine, Ser: 0.84 mg/dL (ref 0.50–1.10)
GFR calc non Af Amer: 70 mL/min — ABNORMAL LOW (ref >90)
Potassium: 3.5 mEq/L (ref 3.5–5.1)

## 2013-06-10 MED ORDER — ALPRAZOLAM 0.5 MG PO TABS: 0.5000 mg | ORAL_TABLET | Freq: Three times a day (TID) | ORAL | Status: DC | PRN

## 2013-06-10 NOTE — ED Notes (Addendum)
Pt reports that she was recently dx with PNA and stayed in the hospital over night Saturday. States that since then her left ear has been bothering her and feels full. Also reports intermittent episodes of diarrhea since leaving the hospital. Denies any fever at home, but reports that she has been on antibiotics.

## 2013-06-10 NOTE — Telephone Encounter (Signed)
She needs something for anxiety according to husband. Call in to [facility]

## 2013-06-10 NOTE — Telephone Encounter (Signed)
Xanax 0.5 mg poq8 hrs prn #30

## 2013-06-10 NOTE — Discharge Summary (Signed)
I agree with the discharge summary as documented.   Xolani Degracia MD  

## 2013-06-10 NOTE — ED Provider Notes (Signed)
CSN: [id number]     Arrival date & time 06/10/13  7829 History   First MD Initiated Contact with Patient 06/10/13 1108     Chief Complaint  Patient presents with  . Shortness of Breath  . Otalgia   (Consider location/radiation/quality/duration/timing/severity/associated sxs/prior Treatment) Patient is a 67 y.o. female presenting with shortness of breath. The history is provided by the patient.  Shortness of Breath Severity:  Moderate Onset quality:  Gradual Duration:  7 days Timing:  Constant Progression:  Unchanged Chronicity:  Recurrent Context: not URI and not weather changes   Context comment:  Recent pneumonia diagnosis, had short hospital stay, only 1 night, now taking augmentin Relieved by:  Nothing Worsened by:  Nothing tried Ineffective treatments:  None tried Associated symptoms: cough and neck pain   Associated symptoms: no abdominal pain, no chest pain, no fever, no headaches, no hemoptysis, no rash, no syncope, no vomiting and no wheezing     Past Medical History  Diagnosis Date  . SOB (shortness of breath)     SOB from obesity and deconditioning 2009- eval included CPX  . Syncope 2008    no recurrence,  2008  . Breast cancer 1997    s/p lumpectomies  . Sinus tachycardia     mild resting  . Chest pain     no proven coronary disease  . HLD (hyperlipidemia)   . Orthostatic hypotension     slight in the past  . Anxiety   . Arthritis   . Depression   . Diverticulosis   . Fibromyalgia 1978  . Hypothyroidism   . Ejection fraction     EF 65%, echo, 2008  . Spinal stenosis     and DDD  . 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 Breast cancer 1997  . Alopecia   . Acid reflux   . Ruptured disk     one in neck and two in back  . DDD (degenerative disc disease)   . Plantar fasciitis    Past Surgical History  Procedure Laterality Date  . Total abdominal hysterectomy      with BSO  . Axillary node dissection      right x 2  . Breast lumpectomy       right  . Tonsillectomy    . Refractive surgery      right  . Lumbar epidural injection      has had 7 injections   Family History  Problem Relation Age of Onset  . Heart disease Maternal Grandfather   . Colon cancer Maternal Aunt   . Other Paternal Grandmother     brain tumor  . Dementia Mother   . Diabetes Mother   . Diabetes Maternal Grandfather   . Diabetes Maternal Aunt   . Prostate cancer Father   . Brain cancer Maternal Grandmother   . Bipolar disorder Maternal Aunt   . Osteoporosis Mother   . Stroke Maternal Aunt    History  Substance Use Topics  . Smoking status: Never Smoker   . Smokeless tobacco: Never Used  . Alcohol Use: No   OB History   Grav Para Term Preterm Abortions TAB SAB Ect Mult Living   1 1        1      Review of Systems  Constitutional: Positive for fatigue. Negative for fever.  Respiratory: Positive for cough and shortness of breath. Negative for hemoptysis and wheezing.   Cardiovascular: Negative for chest pain, leg swelling and syncope.  Gastrointestinal:  Negative for vomiting and abdominal pain.  Musculoskeletal: Positive for neck pain.  Skin: Negative for rash.  Neurological: Negative for headaches.  All other systems reviewed and are negative.    Allergies  Clonazepam; Lasix; Lithium; Pregabalin; Sulfa antibiotics; and Trazodone and nefazodone  Home Medications   Current Outpatient Rx  Name  Route  Sig  Dispense  Refill  . amitriptyline (ELAVIL) 150 MG tablet   Oral   Take 150 mg by mouth at bedtime.         Marland Kitchen amoxicillin-clavulanate (AUGMENTIN) 875-125 MG per tablet   Oral   Take 1 tablet by mouth 2 (two) times daily.   20 tablet   0   . aspirin 81 MG tablet   Oral   Take 81 mg by mouth daily.         . benzonatate (TESSALON PERLES) 100 MG capsule   Oral   Take 1 capsule (100 mg total) by mouth 3 (three) times daily as needed for cough.   20 capsule   0   . BIOTIN PO   Oral   Take 1 tablet by mouth  daily.         Marland Kitchen buPROPion (ZYBAN) 150 MG 12 hr tablet   Oral   Take 300 mg by mouth every morning.         . Calcium-Magnesium-Vitamin D (CALCIUM 1200+D3 PO)   Oral   Take by mouth daily.         . fentaNYL (DURAGESIC) 12 MCG/HR   Transdermal   Place 1 patch onto the skin every 3 (three) days.         Marland Kitchen gabapentin (NEURONTIN) 300 MG capsule   Oral   Take 300 mg by mouth. 300mg  with evening meal, 300mg  at bedtime         . levothyroxine (SYNTHROID, LEVOTHROID) 50 MCG tablet      TAKE ONE TABLET BY MOUTH EVERY DAY   30 tablet   1   . metoprolol succinate (TOPROL-XL) 25 MG 24 hr tablet      TAKE ONE TABLET BY MOUTH EVERY DAY FOR BLOOD PRESSURE AND HEART RATE   30 tablet   9   . Multiple Vitamins-Minerals (MULTIVITAMIN PO)   Oral   Take by mouth daily.         . Omega-3 Fatty Acids (FISH OIL PO)   Oral   Take by mouth daily.         Marland Kitchen omeprazole (PRILOSEC) 20 MG capsule   Oral   Take 20 mg by mouth daily.         . OXcarbazepine (TRILEPTAL) 300 MG/5ML suspension   Oral   Take by mouth 1 day or 1 dose.         . oxyCODONE-acetaminophen (PERCOCET/ROXICET) 5-325 MG per tablet   Oral   Take 1 tablet by mouth every 4 (four) hours as needed. For pain         . Vilazodone HCl (VIIBRYD) 20 MG TABS   Oral   Take 1 tablet by mouth daily.          BP 116/71  Pulse 106  Temp(Src) 98.3 F (36.8 C) (Oral)  Resp 23  Ht 5\' 4"  (1.626 m)  Wt 175 lb (79.379 kg)  BMI 30.02 kg/m2  SpO2 98%  LMP 08/26/1995 Physical Exam  Nursing note and vitals reviewed. Constitutional: She is oriented to person, place, and time. She appears well-developed and well-nourished. No distress.  HENT:  Head: Normocephalic and atraumatic.  Eyes: EOM are normal. Pupils are equal, round, and reactive to light.  Neck: Normal range of motion. Neck supple.  Cardiovascular: Normal rate and regular rhythm.  Exam reveals no friction rub.   No murmur heard. Pulmonary/Chest:  Effort normal. No respiratory distress. She has no wheezes. She has no rales.  Scattered rhonchi, mild  Abdominal: Soft. She exhibits no distension. There is no tenderness. There is no rebound.  Musculoskeletal: Normal range of motion. She exhibits no edema.  Neurological: She is alert and oriented to person, place, and time.  Skin: She is not diaphoretic.    ED Course  Procedures (including critical care time) Labs Review Labs Reviewed  CBC WITH DIFFERENTIAL  BASIC METABOLIC PANEL   Imaging Review Dg Chest 2 View (if Patient Has Fever And/or Copd)  06/10/2013   CLINICAL DATA:  Shortness of Breath  EXAM: CHEST  2 VIEW  COMPARISON:  June 04, 2013  FINDINGS: There has been partial but incomplete clearing of infiltrate from the left lower lobe. There has also been some partial clearing of patchy infiltrate from the right upper lobe.  There is no new opacity. The heart size and pulmonary vascularity are normal. No adenopathy. Surgical clips over the right lateral hemi thorax remain.  IMPRESSION: Partial but incomplete clearing of infiltrate from left lower lobe and right upper lobe. No new opacity.   Electronically Signed   By: Bretta Bang M.D.   On: 06/10/2013 09:52    EKG Interpretation     Ventricular Rate:  90 PR Interval:  141 QRS Duration: 84 QT Interval:  352 QTC Calculation: 431 R Axis:   24 Text Interpretation:  Sinus rhythm Abnormal R-wave progression, early transition Borderline T wave abnormalities Baseline wander in lead(s) V6, new from prior EKG            MDM   1. Community acquired pneumonia   2. Shortness of breath   3. Leukocytosis    67 year old female here with shortness of breath. She is recently admitted for pneumonia about 6 days ago. She is currently taking her medications including Augmentin. She was also found to have an elevated white count at that time and was told that she might have leukemia. She has oncology followup in one week. She  reports worsening shortness of breath. She states she's not really improved since her hospitalization. She denies fevers. She states she still has a mild productive cough. She states overall weakness also. Patient here relaxing comfortably. She has occasional tachycardia low 100s, are usually in the 90s with sinus rhythm on the monitor. She is satting at 90% on room air. She is a good waveform of her O2 sat. Her lungs with some occasional rhonchi. Heart sounds normal. Belly is benign. She states is also concern for some hearing loss in her left ear. She stated she was told she had some fluid behind her ear. I see am very mild effusion behind her left TM. I explained her this and she feels reassured by that. Her chest x-ray shows some improving opacities. She really wants him labs checked, so a check basic labs to see if her white count is improved Labs show similarly elevated white count. I spoke with Dr. Cyndie Chime who stated admission at this time would not help the patient. She can continue to f/u in 1 week as scheduled and does not need any tests any sooner. CXR shows clearing of her pneumonia. Vitals stable. She is stable for  discharge, instructed to continue taking the augmentin.    Dagmar Hait, MD 06/10/13 [phone]

## 2013-06-10 NOTE — Telephone Encounter (Signed)
Rx called in. Pt aware

## 2013-06-10 NOTE — Telephone Encounter (Signed)
Has been told she might have Leukemia.  Does not see Onocologist until 10/24.  Is not handling it very well per husband.  Is wanting something for anxiety.

## 2013-06-11 LAB — CULTURE, BLOOD (ROUTINE X 2)

## 2013-06-13 ENCOUNTER — Telehealth: Payer: Self-pay | Admitting: Family Medicine

## 2013-06-13 ENCOUNTER — Other Ambulatory Visit: Payer: Self-pay | Admitting: Family Medicine

## 2013-06-13 ENCOUNTER — Ambulatory Visit (INDEPENDENT_AMBULATORY_CARE_PROVIDER_SITE_OTHER): Payer: Medicare Other | Admitting: Family Medicine

## 2013-06-13 VITALS — BP 112/74 | HR 90 | Temp 98.2°F | Resp 20 | Ht 64.0 in | Wt 179.8 lb

## 2013-06-13 DIAGNOSIS — E039 Hypothyroidism, unspecified: Secondary | ICD-10-CM

## 2013-06-13 DIAGNOSIS — D72829 Elevated white blood cell count, unspecified: Secondary | ICD-10-CM

## 2013-06-13 DIAGNOSIS — R350 Frequency of micturition: Secondary | ICD-10-CM

## 2013-06-13 LAB — POCT UA - MICROSCOPIC ONLY
Casts, Ur, LPF, POC: NEGATIVE
Crystals, Ur, HPF, POC: POSITIVE

## 2013-06-13 LAB — POCT URINALYSIS DIPSTICK
Glucose, UA: NEGATIVE
Ketones, UA: 15
Nitrite, UA: NEGATIVE

## 2013-06-13 LAB — POCT CBC
Hemoglobin: 11.2 g/dL — AB (ref 12.2–16.2)
Lymph, poc: 34.5 — AB (ref 0.6–3.4)
MCH, POC: 28.6 pg (ref 27–31.2)
MCHC: 30.5 g/dL — AB (ref 31.8–35.4)
MCV: 93.8 fL (ref 80–97)
MID (cbc): 2.8 — AB (ref 0–0.9)
POC Granulocyte: 11.6 — AB (ref 2–6.9)
POC LYMPH PERCENT: 70.5 %L — AB (ref 10–50)
POC MID %: 5.7 %M (ref 0–12)
Platelet Count, POC: 474 10*3/uL — AB (ref 142–424)
RBC: 3.91 M/uL — AB (ref 4.04–5.48)
WBC: 48.9 10*3/uL — AB (ref 4.6–10.2)

## 2013-06-13 MED ORDER — CIPROFLOXACIN HCL 500 MG PO TABS: 500.0000 mg | ORAL_TABLET | Freq: Two times a day (BID) | ORAL | Status: DC

## 2013-06-13 MED ORDER — PHENAZOPYRIDINE HCL 100 MG PO TABS: 100.0000 mg | ORAL_TABLET | Freq: Three times a day (TID) | ORAL | Status: DC | PRN

## 2013-06-13 NOTE — Telephone Encounter (Signed)
Patient having UTI symptoms. Wants to know what to do . No opening.

## 2013-06-13 NOTE — Progress Notes (Addendum)
[facility] [address] [place] [address] [phone]  Date:  06/13/2013   Name:  [name] [name]   DOB:  1946-02-14   MRN:  [id number]  PCP:  Leo Grosser, MD    Chief Complaint: head congestion and Urinary Tract Infection   History of Present Illness:  [name] [name] is a 67 y.o. very pleasant female patient who presents with the following:  Somewhat complex recent past medical history.  Pt at [facility].  She was there on 10/6 and was treated with a zpack for a respiratory infection.  She returned on 10/9 with worsening sx, her abx were changed to levaquin.  She presented to the ED on 10/11 and was admitted with CAP and leukocytosis.  Her leukocytosis was significant enough to suggest leukemia, and heme-onc follow-up is planned.  She spent one night in the hospital and was discharged with augmentin for 10 days.   She was back at the ED on 10/17 with worsening SOB.  However her CXR showed clearing, and she went home with instructions to continue the augmenting.    She is seeing Heme- onc on Friday.  She is understandably worried about the possibility of leukemia. Her PCP called in some xanax for her a few days ago which is helping a little bit.   She has a history of right ear deafness "all my life".  She now notes difficulty hearing from her left ear for the last 2 weeks.  It will seem to sometimes "pop and break loose."  It hurts on occasion but not generally.  Not being abel to hear well is really bothering her.   She is still couging up some green, "blood tinged" material."  She notes PND.  No recent fever noted.    She is concerned about a UTI- she has noted urinary frequency for the last 10 days or so.  Yesterday she noted that her urine appeared dark- however she is has not seen any definite blood in her urine.    She also has noted diarrhea for the last 9 days- she is having 6-8 small, loose stools today.  She has had fecal incontinence  a couple of times.    History of chronic back and neck problems, breast cancer a few years ago.    Patient Active Problem List   Diagnosis Date Noted  . CAP (community acquired pneumonia) 06/05/2013  . 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 Chronic pain syndrome 05/10/2013  . H/O long-term treatment with high-risk medication 05/10/2013  . DDD (degenerative disc disease) 05/10/2013  . SOB (shortness of breath)   . Syncope   . Breast cancer   . Sinus tachycardia   . Chest pain   . HLD (hyperlipidemia)   . Orthostatic hypotension   . Anxiety   . Arthritis   . Diverticulosis   . Fibromyalgia   . Hypothyroidism   . Ejection fraction     Past Medical History  Diagnosis Date  . SOB (shortness of breath)     SOB from obesity and deconditioning 2009- eval included CPX  . Syncope 2008    no recurrence,  2008  . Breast cancer 1997    s/p lumpectomies  . Sinus tachycardia     mild resting  . Chest pain     no proven coronary disease  . HLD (hyperlipidemia)   . Orthostatic hypotension     slight in the past  . Anxiety   .  Arthritis   . Depression   . Diverticulosis   . Fibromyalgia 1978  . Hypothyroidism   . Ejection fraction     EF 65%, echo, 2008  . Spinal stenosis     and DDD  . 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 Breast cancer 1997  . Alopecia   . Acid reflux   . Ruptured disk     one in neck and two in back  . DDD (degenerative disc disease)   . Plantar fasciitis     Past Surgical History  Procedure Laterality Date  . Total abdominal hysterectomy      with BSO  . Axillary node dissection      right x 2  . Breast lumpectomy      right  . Tonsillectomy    . Refractive surgery      right  . Lumbar epidural injection      has had 7 injections    History  Substance Use Topics  . Smoking status: Never Smoker   . Smokeless tobacco: Never Used  . Alcohol Use: No    Family History  Problem Relation Age of Onset  . Heart disease Maternal  Grandfather   . Diabetes Maternal Grandfather   . Colon cancer Maternal Aunt   . Other Paternal Grandmother     brain tumor  . Dementia Mother   . Diabetes Mother   . Osteoporosis Mother   . Diabetes Maternal Aunt   . Prostate cancer Father   . Brain cancer Maternal Grandmother   . Bipolar disorder Maternal Aunt   . Stroke Maternal Aunt     Allergies  Allergen Reactions  . Clonazepam   . Lasix [Furosemide]     Ears, nose and throat feel like closing  . Lithium Other (See Comments)    dizziness  . Pregabalin     REACTION: diarrhea / nausea / rash  . Sulfa Antibiotics Hives  . Trazodone And Nefazodone Other (See Comments)    wakefulness    Medication list has been reviewed and updated.  Current Outpatient Prescriptions on File Prior to Visit  Medication Sig Dispense Refill  . ALPRAZolam (XANAX) 0.5 MG tablet Take 1 tablet (0.5 mg total) by mouth every 8 (eight) hours as needed for sleep.  30 tablet  0  . amitriptyline (ELAVIL) 150 MG tablet Take 150 mg by mouth at bedtime.      Marland Kitchen amoxicillin-clavulanate (AUGMENTIN) 875-125 MG per tablet Take 1 tablet by mouth 2 (two) times daily.  20 tablet  0  . aspirin 81 MG tablet Take 81 mg by mouth daily.      Marland Kitchen buPROPion (ZYBAN) 150 MG 12 hr tablet Take 300 mg by mouth every morning.      . Calcium-Magnesium-Vitamin D (CALCIUM 1200+D3 PO) Take 1 tablet by mouth daily.       Marland Kitchen levothyroxine (SYNTHROID, LEVOTHROID) 50 MCG tablet Take 50 mcg by mouth daily before breakfast.      . metoprolol succinate (TOPROL-XL) 25 MG 24 hr tablet TAKE ONE TABLET BY MOUTH EVERY DAY FOR BLOOD PRESSURE AND HEART RATE  30 tablet  9  . Multiple Vitamins-Minerals (MULTIVITAMIN PO) Take by mouth daily.      . Omega-3 Fatty Acids (FISH OIL PO) Take 1 capsule by mouth daily.       Marland Kitchen omeprazole (PRILOSEC) 20 MG capsule Take 20 mg by mouth daily.      . Oxcarbazepine (TRILEPTAL) 300 MG tablet Take 300 mg by mouth  2 (two) times daily.      Marland Kitchen  oxyCODONE-acetaminophen (PERCOCET/ROXICET) 5-325 MG per tablet Take 1 tablet by mouth every 4 (four) hours as needed. For pain      . Vilazodone HCl (VIIBRYD) 20 MG TABS Take 1 tablet by mouth daily.      . benzonatate (TESSALON PERLES) 100 MG capsule Take 1 capsule (100 mg total) by mouth 3 (three) times daily as needed for cough.  20 capsule  0  . BIOTIN PO Take 1 tablet by mouth daily.      . fentaNYL (DURAGESIC) 12 MCG/HR Place 1 patch onto the skin every 3 (three) days.      Marland Kitchen gabapentin (NEURONTIN) 300 MG capsule Take 300 mg by mouth. 300mg  with evening meal, 300mg  at bedtime       No current facility-administered medications on file prior to visit.    Review of Systems:  As per HPI- otherwise negative.   Physical Examination: Filed Vitals:   06/13/13 1348  BP: 112/74  Pulse: 100  Temp: 98.2 F (36.8 C)  Resp: 20   Filed Vitals:   06/13/13 1348  Height: 5\' 4"  (1.626 m)  Weight: 179 lb 12.8 oz (81.557 kg)   Body mass index is 30.85 kg/(m^2). Ideal Body Weight: Weight in (lb) to have BMI = 25: 145.3  GEN: WDWN, NAD, Non-toxic, A & O x 3, overweight, looks well HEENT: Atraumatic, Normocephalic. Neck supple. No masses, No LAD.  PEERL, EOMI.  Oropharynx wnl.  Right TM is normal.  Left TM with clear fluid effusion.  Bilateral ear canals normal.   Ears and Nose: No external deformity. CV: RRR, No M/G/R. No JVD. No thrill. No extra heart sounds. PULM: CTA B, no wheezes, crackles, rhonchi. No retractions. No resp. distress. No accessory muscle use. ABD: S, NT, ND. No rebound. No HSM. EXTR: No c/c/e NEURO Normal gait.  PSYCH: Normally interactive. Conversant. Not depressed or anxious appearing.  Calm demeanor.    Results for orders placed in visit on 06/13/13  POCT URINALYSIS DIPSTICK      Result Value Range   Color, UA yellow     Clarity, UA cloudy     Glucose, UA negative     Bilirubin, UA small     Ketones, UA 15     Spec Grav, UA >=1.030     Blood, UA large     pH,  UA 5.5     Protein, UA 100     Urobilinogen, UA 0.2     Nitrite, UA negative     Leukocytes, UA Trace    POCT UA - MICROSCOPIC ONLY      Result Value Range   WBC, Ur, HPF, POC 4-6     RBC, urine, microscopic 10-20     Bacteria, U Microscopic trace     Mucus, UA trace     Epithelial cells, urine per micros 2-4     Crystals, Ur, HPF, POC positive     Casts, Ur, LPF, POC negative     Yeast, UA negative    POCT CBC      Result Value Range   WBC 48.9 (*) 4.6 - 10.2 K/uL   Lymph, poc 34.5 (*) 0.6 - 3.4   POC LYMPH PERCENT 70.5 (*) 10 - 50 %L   MID (cbc) 2.8 (*) 0 - 0.9   POC MID % 5.7  0 - 12 %M   POC Granulocyte 11.6 (*) 2 - 6.9   Granulocyte percent 23.8 (*)  37 - 80 %G   RBC 3.91 (*) 4.04 - 5.48 M/uL   Hemoglobin 11.2 (*) 12.2 - 16.2 g/dL   HCT, POC 96.0 (*) 45.4 - 47.9 %   MCV 93.8  80 - 97 fL   MCH, POC 28.6  27 - 31.2 pg   MCHC 30.5 (*) 31.8 - 35.4 g/dL   RDW, POC 09.8     Platelet Count, POC 474 (*) 142 - 424 K/uL   MPV 9.5  0 - 99.8 fL    Assessment and Plan: Frequency of urination - Plan: POCT urinalysis dipstick, POCT UA - Microscopic Only, Urine culture, phenazopyridine (PYRIDIUM) 100 MG tablet  Hypothyroidism - Plan: TSH  Leukocytosis - Plan: POCT CBC  At this time I do not see compelling evidence of a UTI.  She has been noted to have hematuria up to 21- 50 cells per field on 10/11- her urine culture from the same day was negative.  As she has been on several abx recently and is having loose stools will defer starting any other abx at this time.  Await culture.  May use pyridium prn for discomfort  Check TSH as she does not think this has been done in some time  Significant leukocytosis.  Not significantly different from 3 days ago.  At that time Heme- Onc wishes to continue with the plan for outpatient follow-up later this week.  Waiting is hard for her, but she understands that she will soon have more answers and there is no significant change in her blood counts  now  Left ear congestion: she is deaf in her right ear so this is very worrisome to her.  She seems to hear me quite well in exam; it seems her trouble is more popping and cracking in the ear.  However she admits this does seem better today.  Encouraged her to stay the course with her augmentin, and she may try a decongestant.  If not continuing to feel better will refer to ENT   Signed Abbe Amsterdam, MD  10/21: called with normal TSH.  Await urine culture

## 2013-06-13 NOTE — Patient Instructions (Signed)
I will be in touch with the rest of your labs.  Use the pyridium as needed for pain in your bladder.  If you are not doing ok please let me know.

## 2013-06-13 NOTE — Telephone Encounter (Signed)
cipro 500 bid for 3 days. 

## 2013-06-13 NOTE — Telephone Encounter (Signed)
rx to pharmacy per provider. Left pt mess that rx was sent

## 2013-06-14 LAB — URINE CULTURE: Organism ID, Bacteria: NO GROWTH

## 2013-06-15 ENCOUNTER — Encounter: Payer: Self-pay | Admitting: Family Medicine

## 2013-06-15 ENCOUNTER — Ambulatory Visit: Payer: Medicare Other | Admitting: Physician Assistant

## 2013-06-15 ENCOUNTER — Telehealth: Payer: Self-pay

## 2013-06-15 NOTE — Telephone Encounter (Signed)
Called patient advised urine culture negative, she is now having "bowel pains" also. I have advised her to return to clinic, she will come in tomorrow. If she gets significantly worse, she will go to the Emergency room. To you FYI

## 2013-06-15 NOTE — Telephone Encounter (Signed)
PT STATES SHE WAS GIVEN SOME MEDICINE FROM DR COPLAND FOR HER UTI AND IT WAS HELPING, BUT NOW IT ISN'T HELPING AND SHE IS IN A LOT OF DISCOMFORT PLEASE CALL [phone]

## 2013-06-16 ENCOUNTER — Telehealth: Payer: Self-pay | Admitting: Radiology

## 2013-06-16 ENCOUNTER — Ambulatory Visit (INDEPENDENT_AMBULATORY_CARE_PROVIDER_SITE_OTHER): Payer: Medicare Other | Admitting: Emergency Medicine

## 2013-06-16 ENCOUNTER — Ambulatory Visit
Admission: RE | Admit: 2013-06-16 | Discharge: 2013-06-16 | Disposition: A | Discharge status: Home / Self Care | Payer: Medicare Other | Source: Ambulatory Visit | Attending: Emergency Medicine | Admitting: Emergency Medicine

## 2013-06-16 ENCOUNTER — Ambulatory Visit: Payer: Medicare Other

## 2013-06-16 VITALS — BP 102/68 | HR 92 | Temp 98.4°F | Resp 18 | Ht 64.0 in | Wt 182.0 lb

## 2013-06-16 DIAGNOSIS — R319 Hematuria, unspecified: Secondary | ICD-10-CM

## 2013-06-16 DIAGNOSIS — N200 Calculus of kidney: Secondary | ICD-10-CM

## 2013-06-16 DIAGNOSIS — J189 Pneumonia, unspecified organism: Secondary | ICD-10-CM

## 2013-06-16 DIAGNOSIS — N390 Urinary tract infection, site not specified: Secondary | ICD-10-CM

## 2013-06-16 DIAGNOSIS — R350 Frequency of micturition: Secondary | ICD-10-CM

## 2013-06-16 DIAGNOSIS — R109 Unspecified abdominal pain: Secondary | ICD-10-CM

## 2013-06-16 LAB — POCT UA - MICROSCOPIC ONLY
Casts, Ur, LPF, POC: NEGATIVE
Crystals, Ur, HPF, POC: NEGATIVE

## 2013-06-16 LAB — POCT URINALYSIS DIPSTICK
Ketones, UA: 15
Protein, UA: 300
Spec Grav, UA: >=1.03
Urobilinogen, UA: 4
pH, UA: 5

## 2013-06-16 MED ORDER — CIPROFLOXACIN HCL 250 MG PO TABS: 250.0000 mg | ORAL_TABLET | Freq: Two times a day (BID) | ORAL | Status: DC

## 2013-06-16 NOTE — Telephone Encounter (Signed)
Called her back today.  Let her know that her urine culture is negative.  Encouraged her to come in today to ensure she is stable to wait for her heme- one appt tomorrow.  She agreed to do so

## 2013-06-16 NOTE — Telephone Encounter (Signed)
Call report from CT about her. She is to go now to [facility] and see Dr Brunilda Payor. Have advised Helmut Muster at [facility] to send patient with her CD, and she will do this.

## 2013-06-16 NOTE — Progress Notes (Signed)
This chart was scribed for Lesle Chris, MD by Ardelia Mems, Scribe. This patient was seen in room Room 10 and the patient's care was started at 12:40 PM.  Subjective:    Patient ID: [name] [name], female    DOB: Sep 24, 1945, 67 y.o.   MRN: [id number]  HPI  HPI Comments: [name] [name] is a 67 y.o. female who presents to [facility] complaining of urinary urgency and frequency over the past 3 days, as well as loose stools daily over the past 3 weeks. She states that she was seen for this 3 days ago. She was seen on 05/30/13 she was seen here for a sore throat and diagnosed with a respiratory infection. She was later admitted on 10/11 with pneumonia and states that she had an "insanely high" WBC count. She states that she was told she may have leukemia. She was seen in the ED on 10/17 and had a good looking repeat X-ray. She also states that she has had an ear infection recently.    Past Medical History  Diagnosis Date  . SOB (shortness of breath)     SOB from obesity and deconditioning 2009- eval included CPX  . Syncope 2008    no recurrence,  2008  . Breast cancer 1997    s/p lumpectomies  . Sinus tachycardia     mild resting  . Chest pain     no proven coronary disease  . HLD (hyperlipidemia)   . Orthostatic hypotension     slight in the past  . Anxiety   . Arthritis   . Depression   . Diverticulosis   . Fibromyalgia 1978  . Hypothyroidism   . Ejection fraction     EF 65%, echo, 2008  . Spinal stenosis     and DDD  . 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 Breast cancer 1997  . Alopecia   . Acid reflux   . Ruptured disk     one in neck and two in back  . DDD (degenerative disc disease)   . Plantar fasciitis     Review of Systems  Genitourinary: Positive for urgency and frequency.       Objective:   Physical Exam the right TM is normal. There is fluid behind the left TM with a yellowish discoloration to throat is normal. Neck is supple. Chest exam reveals rales  present in the left mid lung base. Heart is regular rate without murmurs or gallops. Abdomen is soft liver spleen not enlarged [facility] reading (PRIMARY) by  Dr. Cleta Alberts chest x-ray shows significant improvement with tiny residual left-sided infiltrate Results for orders placed in visit on 06/16/13  POCT UA - MICROSCOPIC ONLY      Result Value Range   WBC, Ur, HPF, POC 6-tntc     RBC, urine, microscopic 8-25     Bacteria, U Microscopic 1+     Mucus, UA neg     Epithelial cells, urine per micros 0-10     Crystals, Ur, HPF, POC neg     Casts, Ur, LPF, POC neg     Yeast, UA neg    POCT URINALYSIS DIPSTICK      Result Value Range   Color, UA orange     Clarity, UA clear     Glucose, UA 100     Bilirubin, UA mod     Ketones, UA 15     Spec Grav, UA >=1.030     Blood, UA mod  pH, UA 5.0     Protein, UA 300     Urobilinogen, UA 4.0     Nitrite, UA positive     Leukocytes, UA large (3+)          Assessment & Plan:  Patient does have a very abnormal urine with evidence of a blood dyscrasia. Go-ahead reculture her urine. She is going over to have a CT urogram to be sure she does not have a uric acid stone secondary to high cell turnover. Appointment to be made with a large Rollet he in the morning since she is seeing Dr. Clelia Croft  in the afternoon her CT showed bilateral obstructing calculi. Dr. Brunilda Payor  was called and he is going to see the patient emergently. .  I personally performed the services described in this documentation, which was scribed in my presence. The recorded information has been reviewed and is accurate.

## 2013-06-17 ENCOUNTER — Ambulatory Visit (HOSPITAL_COMMUNITY): Payer: Medicare Other

## 2013-06-17 ENCOUNTER — Telehealth: Payer: Self-pay | Admitting: Oncology

## 2013-06-17 ENCOUNTER — Encounter (HOSPITAL_BASED_OUTPATIENT_CLINIC_OR_DEPARTMENT_OTHER): Payer: Medicare Other | Admitting: Anesthesiology

## 2013-06-17 ENCOUNTER — Other Ambulatory Visit: Payer: Self-pay | Admitting: Urology

## 2013-06-17 ENCOUNTER — Ambulatory Visit: Payer: Medicare Other

## 2013-06-17 ENCOUNTER — Encounter (HOSPITAL_BASED_OUTPATIENT_CLINIC_OR_DEPARTMENT_OTHER)
Admission: RE | Disposition: A | Discharge status: Home / Self Care | Payer: Self-pay | Source: Ambulatory Visit | Attending: Urology

## 2013-06-17 ENCOUNTER — Other Ambulatory Visit: Payer: Medicare Other | Admitting: Lab

## 2013-06-17 ENCOUNTER — Ambulatory Visit (HOSPITAL_BASED_OUTPATIENT_CLINIC_OR_DEPARTMENT_OTHER)
Admission: RE | Admit: 2013-06-17 | Discharge: 2013-06-17 | Disposition: A | Discharge status: Home / Self Care | Payer: Medicare Other | Source: Ambulatory Visit | Attending: Urology | Admitting: Urology

## 2013-06-17 ENCOUNTER — Ambulatory Visit (HOSPITAL_BASED_OUTPATIENT_CLINIC_OR_DEPARTMENT_OTHER): Payer: Medicare Other | Admitting: Anesthesiology

## 2013-06-17 ENCOUNTER — Encounter (HOSPITAL_BASED_OUTPATIENT_CLINIC_OR_DEPARTMENT_OTHER): Payer: Self-pay | Admitting: *Deleted

## 2013-06-17 ENCOUNTER — Ambulatory Visit: Payer: Medicare Other | Admitting: Oncology

## 2013-06-17 DIAGNOSIS — Z7982 Long term (current) use of aspirin: Secondary | ICD-10-CM | POA: Insufficient documentation

## 2013-06-17 DIAGNOSIS — Z853 Personal history of malignant neoplasm of breast: Secondary | ICD-10-CM | POA: Insufficient documentation

## 2013-06-17 DIAGNOSIS — N390 Urinary tract infection, site not specified: Secondary | ICD-10-CM | POA: Insufficient documentation

## 2013-06-17 DIAGNOSIS — Z79899 Other long term (current) drug therapy: Secondary | ICD-10-CM | POA: Insufficient documentation

## 2013-06-17 DIAGNOSIS — N200 Calculus of kidney: Secondary | ICD-10-CM | POA: Insufficient documentation

## 2013-06-17 DIAGNOSIS — E039 Hypothyroidism, unspecified: Secondary | ICD-10-CM | POA: Insufficient documentation

## 2013-06-17 DIAGNOSIS — K219 Gastro-esophageal reflux disease without esophagitis: Secondary | ICD-10-CM | POA: Insufficient documentation

## 2013-06-17 DIAGNOSIS — N1330 Unspecified hydronephrosis: Secondary | ICD-10-CM | POA: Insufficient documentation

## 2013-06-17 DIAGNOSIS — N201 Calculus of ureter: Secondary | ICD-10-CM | POA: Insufficient documentation

## 2013-06-17 DIAGNOSIS — IMO0001 Reserved for inherently not codable concepts without codable children: Secondary | ICD-10-CM | POA: Insufficient documentation

## 2013-06-17 HISTORY — PX: CYSTOSCOPY WITH RETROGRADE PYELOGRAM, URETEROSCOPY AND STENT PLACEMENT: SHX5789

## 2013-06-17 HISTORY — DX: Dorsalgia, unspecified: M54.9

## 2013-06-17 HISTORY — PX: HOLMIUM LASER APPLICATION: SHX5852

## 2013-06-17 HISTORY — DX: Hematuria, unspecified: R31.9

## 2013-06-17 HISTORY — DX: Unspecified hearing loss, right ear: H91.91

## 2013-06-17 HISTORY — DX: Personal history of other specified conditions: Z87.898

## 2013-06-17 HISTORY — DX: Other chronic pain: G89.29

## 2013-06-17 HISTORY — DX: Gastro-esophageal reflux disease without esophagitis: K21.9

## 2013-06-17 HISTORY — DX: Pneumonia, unspecified organism: J18.9

## 2013-06-17 SURGERY — CYSTOURETEROSCOPY, WITH RETROGRADE PYELOGRAM AND STENT INSERTION
Anesthesia: General | Site: Ureter | Laterality: Bilateral | Wound class: Clean Contaminated

## 2013-06-17 MED ORDER — ONDANSETRON HCL 4 MG/2ML IJ SOLN
INTRAMUSCULAR | Status: DC | PRN
  Administered 2013-06-17: 4 mg via INTRAVENOUS

## 2013-06-17 MED ORDER — ROCURONIUM BROMIDE 100 MG/10ML IV SOLN
INTRAVENOUS | Status: DC | PRN
  Administered 2013-06-17: 30 mg via INTRAVENOUS

## 2013-06-17 MED ORDER — LACTATED RINGERS IV SOLN
INTRAVENOUS | Status: DC
  Filled 2013-06-17: qty 1000

## 2013-06-17 MED ORDER — CEFAZOLIN SODIUM 1-5 GM-% IV SOLN
1.0000 g | INTRAVENOUS | Status: DC
  Filled 2013-06-17: qty 50

## 2013-06-17 MED ORDER — FENTANYL CITRATE 0.05 MG/ML IJ SOLN
INTRAMUSCULAR | Status: DC | PRN
  Administered 2013-06-17 (×2): 50 ug via INTRAVENOUS

## 2013-06-17 MED ORDER — DEXAMETHASONE SODIUM PHOSPHATE 4 MG/ML IJ SOLN
INTRAMUSCULAR | Status: DC | PRN
  Administered 2013-06-17: 10 mg via INTRAVENOUS

## 2013-06-17 MED ORDER — CEFAZOLIN SODIUM-DEXTROSE 2-3 GM-% IV SOLR
2.0000 g | INTRAVENOUS | Status: DC
  Filled 2013-06-17: qty 50

## 2013-06-17 MED ORDER — SODIUM CHLORIDE 0.9 % IR SOLN
Status: DC | PRN
  Administered 2013-06-17: 6000 mL

## 2013-06-17 MED ORDER — KETOROLAC TROMETHAMINE 30 MG/ML IJ SOLN
INTRAMUSCULAR | Status: DC | PRN
  Administered 2013-06-17: 30 mg via INTRAVENOUS

## 2013-06-17 MED ORDER — GLYCOPYRROLATE 0.2 MG/ML IJ SOLN
INTRAMUSCULAR | Status: DC | PRN
  Administered 2013-06-17: 0.4 mg via INTRAVENOUS

## 2013-06-17 MED ORDER — NEOSTIGMINE METHYLSULFATE 1 MG/ML IJ SOLN
INTRAMUSCULAR | Status: DC | PRN
  Administered 2013-06-17: 3 mg via INTRAVENOUS

## 2013-06-17 MED ORDER — CEFAZOLIN SODIUM-DEXTROSE 2-3 GM-% IV SOLR
INTRAVENOUS | Status: DC | PRN
  Administered 2013-06-17: 2 g via INTRAVENOUS

## 2013-06-17 MED ORDER — MIDAZOLAM HCL 5 MG/5ML IJ SOLN
INTRAMUSCULAR | Status: DC | PRN
  Administered 2013-06-17: 0.5 mg via INTRAVENOUS
  Administered 2013-06-17: 1 mg via INTRAVENOUS
  Administered 2013-06-17: 0.5 mg via INTRAVENOUS

## 2013-06-17 MED ORDER — PROPOFOL 10 MG/ML IV BOLUS
INTRAVENOUS | Status: DC | PRN
  Administered 2013-06-17: 200 mg via INTRAVENOUS
  Administered 2013-06-17: 30 mg via INTRAVENOUS
  Administered 2013-06-17: 70 mg via INTRAVENOUS

## 2013-06-17 MED ORDER — IOHEXOL 350 MG/ML SOLN
INTRAVENOUS | Status: DC | PRN
  Administered 2013-06-17: 15 mL

## 2013-06-17 MED ORDER — PROMETHAZINE HCL 25 MG/ML IJ SOLN
6.2500 mg | INTRAMUSCULAR | Status: DC | PRN
  Filled 2013-06-17: qty 1

## 2013-06-17 MED ORDER — LACTATED RINGERS IV SOLN
INTRAVENOUS | Status: DC
  Administered 2013-06-17: 12:00:00 via INTRAVENOUS
  Filled 2013-06-17: qty 1000

## 2013-06-17 MED ORDER — HYDROCODONE-ACETAMINOPHEN 5-325 MG PO TABS: 1.0000 | ORAL_TABLET | Freq: Four times a day (QID) | ORAL | Status: DC | PRN

## 2013-06-17 MED ORDER — SUCCINYLCHOLINE CHLORIDE 20 MG/ML IJ SOLN
INTRAMUSCULAR | Status: DC | PRN
  Administered 2013-06-17: 20 mg via INTRAVENOUS
  Administered 2013-06-17: 100 mg via INTRAVENOUS

## 2013-06-17 MED ORDER — HYDROMORPHONE HCL PF 1 MG/ML IJ SOLN
0.2500 mg | INTRAMUSCULAR | Status: DC | PRN
  Filled 2013-06-17: qty 1

## 2013-06-17 MED ORDER — LACTATED RINGERS IV SOLN
INTRAVENOUS | Status: DC | PRN
  Administered 2013-06-17 (×3): via INTRAVENOUS

## 2013-06-17 MED ORDER — LIDOCAINE HCL (CARDIAC) 20 MG/ML IV SOLN
INTRAVENOUS | Status: DC | PRN
  Administered 2013-06-17: 80 mg via INTRAVENOUS

## 2013-06-17 MED ORDER — PHENAZOPYRIDINE HCL 100 MG PO TABS
100.0000 mg | ORAL_TABLET | Freq: Three times a day (TID) | ORAL | Status: DC
  Administered 2013-06-17: 100 mg via ORAL
  Filled 2013-06-17: qty 1

## 2013-06-17 MED ORDER — OXYCODONE-ACETAMINOPHEN 5-325 MG PO TABS
1.0000 | ORAL_TABLET | ORAL | Status: DC | PRN
  Administered 2013-06-17: 1 via ORAL
  Filled 2013-06-17: qty 1

## 2013-06-17 SURGICAL SUPPLY — 36 items
ADAPTER CATH URET PLST 4-6FR (CATHETERS) IMPLANT
ADPR CATH URET STRL DISP 4-6FR (CATHETERS)
BAG DRAIN URO-CYSTO SKYTR STRL (DRAIN) ×2 IMPLANT
BAG DRN UROCATH (DRAIN) ×1
BASKET LASER NITINOL 1.9FR (BASKET) IMPLANT
BASKET STNLS GEMINI 4WIRE 3FR (BASKET) IMPLANT
BASKET ZERO TIP NITINOL 2.4FR (BASKET) ×1 IMPLANT
BRUSH URET BIOPSY 3F (UROLOGICAL SUPPLIES) IMPLANT
BSKT STON RTRVL 120 1.9FR (BASKET)
BSKT STON RTRVL GEM 120X11 3FR (BASKET)
BSKT STON RTRVL ZERO TP 2.4FR (BASKET) ×1
CANISTER SUCT LVC 12 LTR MEDI- (MISCELLANEOUS) IMPLANT
CATH INTERMIT  6FR 70CM (CATHETERS) IMPLANT
CATH URET 5FR 28IN CONE TIP (BALLOONS) ×1
CATH URET 5FR 28IN OPEN ENDED (CATHETERS) IMPLANT
CATH URET 5FR 70CM CONE TIP (BALLOONS) IMPLANT
CLOTH BEACON ORANGE TIMEOUT ST (SAFETY) ×2 IMPLANT
DRAPE CAMERA CLOSED 9X96 (DRAPES) ×1 IMPLANT
FIBER LASER FLEXIVA 1000 (UROLOGICAL SUPPLIES) IMPLANT
FIBER LASER FLEXIVA 200 (UROLOGICAL SUPPLIES) IMPLANT
FIBER LASER FLEXIVA 365 (UROLOGICAL SUPPLIES) IMPLANT
FIBER LASER FLEXIVA 550 (UROLOGICAL SUPPLIES) IMPLANT
GLOVE BIO SURGEON STRL SZ7 (GLOVE) ×2 IMPLANT
GUIDEWIRE 0.038 PTFE COATED (WIRE) ×2 IMPLANT
GUIDEWIRE ANG ZIPWIRE 038X150 (WIRE) ×1 IMPLANT
GUIDEWIRE STR DUAL SENSOR (WIRE) ×1 IMPLANT
KIT BALLIN UROMAX 15FX10 (LABEL) IMPLANT
KIT BALLN UROMAX 15FX4 (MISCELLANEOUS) IMPLANT
KIT BALLN UROMAX 26 75X4 (MISCELLANEOUS)
NS IRRIG 500ML POUR BTL (IV SOLUTION) IMPLANT
PACK CYSTOSCOPY (CUSTOM PROCEDURE TRAY) ×2 IMPLANT
SET HIGH PRES BAL DIL (LABEL)
SHEATH URET ACCESS 12FR/35CM (UROLOGICAL SUPPLIES) ×1 IMPLANT
SHEATH URET ACCESS 12FR/55CM (UROLOGICAL SUPPLIES) IMPLANT
STENT POLARIS LOOP 6FR X 24 CM (STENTS) IMPLANT
STENT POLARIS LOOP 6FR X 26 CM (STENTS) ×2 IMPLANT

## 2013-06-17 NOTE — Anesthesia Preprocedure Evaluation (Addendum)
Anesthesia Evaluation  Patient identified by MRN, date of birth, ID band Patient awake    Reviewed: Allergy & Precautions, H&P , NPO status , Patient's Chart, lab work & pertinent test results  Airway Mallampati: II TM Distance: >3 FB Neck ROM: Full    Dental  (+) Teeth Intact and Dental Advisory Given   Pulmonary shortness of breath and with exertion, pneumonia - (Pneumonia diagnosed 3 weeks ago; Rx with Abx.), resolved,  breath sounds clear to auscultation  Pulmonary exam normal       Cardiovascular negative cardio ROS  Rhythm:Regular Rate:Normal     Neuro/Psych Anxiety Depression  Neuromuscular disease negative neurological ROS     GI/Hepatic Neg liver ROS, GERD-  ,  Endo/Other  Hypothyroidism   Renal/GU Renal disease  negative genitourinary   Musculoskeletal  (+) Fibromyalgia -  Abdominal   Peds  Hematology negative hematology ROS (+)   Anesthesia Other Findings Patient states she is scheduled for a hematologist for an evaluation for Leukemia  Reproductive/Obstetrics                          Anesthesia Physical Anesthesia Plan  ASA: II  Anesthesia Plan: General   Post-op Pain Management:    Induction: Intravenous  Airway Management Planned: LMA  Additional Equipment:   Intra-op Plan:   Post-operative Plan: Extubation in OR  Informed Consent: I have reviewed the patients History and Physical, chart, labs and discussed the procedure including the risks, benefits and alternatives for the proposed anesthesia with the patient or authorized representative who has indicated his/her understanding and acceptance.   Dental advisory given  Plan Discussed with: CRNA  Anesthesia Plan Comments:         Anesthesia Quick Evaluation

## 2013-06-17 NOTE — H&P (Signed)
[name] [name] is referred by Dr Lesle Chris.  1 week ago she experienced severe right lower quadrant pain.  The pain is also in the suprapubic area and left lower quadrant.  She was seen at the Urgent Care on Monday.  She was started on antibiotics for UTI.  The pain has persisted.  It is not associated with nausea.  She returned to the Urgent Care today.  CT scan showed bilateral non obstructing renal calculi, a 5 mm right distal ureteral calculus and a 7 mm left proximal ureteral calculus with mild hydronephrosis. She is seen this afternoon at Dr Ellis Parents request for further evaluation.  She still has moderate right lower quadrant pain.  She has a past history of kidney stone and was seen in the ER earlier this year for kidney stone.  She is not sure whether she passed that stone or not. ill not repeat one this afternoon.Urine culture is pending at the Urgent Care.  She states that she was recently treated for pneumonia and it is all clear now.  She also has a white count of 50,000 and is scheduled to see Dr Clelia Croft in AM.   Past Medical History Problems  1. History of  Anxiety (Symptom) 799.2 2. History of  Arthritis V13.4 3. History of  Asthma 493.90 4. History of  Breast Cancer V10.3 5. History of  Depression 311 6. History of  Esophageal Reflux 530.81 7. History of  Fibromyalgia 729.1  Surgical History Problems  1. History of  Breast Surgery 2. History of  Corneal LASIK Bilateral 3. History of  Hysterectomy 4. History of  Hysterectomy 5. History of  Lymphadenectomy  Current Meds 1. ALPRAZolam 0.5 MG Oral Tablet; Therapy: (Recorded:23Oct2014) to 2. Amitriptyline HCl TABS; Therapy: (Recorded:23Oct2014) to 3. Aspirin 81 MG Oral Tablet; Therapy: (Recorded:23Oct2014) to 4. Augmentin 875-125 MG Oral Tablet; Therapy: (Recorded:23Oct2014) to 5. Benzonatate 100 MG Oral Capsule; Therapy: (Recorded:23Oct2014) to 6. Biotin Forte TABS; Therapy: (Recorded:23Oct2014) to 7. BuPROPion HCl ER (SR)  150 MG Oral Tablet Extended Release 12 Hour; Therapy:  (Recorded:23Oct2014) to 8. Calcium/Vitamin D/Minerals TABS; Therapy: (Recorded:23Oct2014) to 9. Gabapentin 300 MG Oral Capsule; Therapy: (Recorded:23Oct2014) to 10. Levothyroxine Sodium 50 MCG Oral Tablet; Therapy: (Recorded:23Oct2014) to 11. Metoprolol Tartrate 25 MG Oral Tablet; Therapy: (Recorded:23Oct2014) to 12. Multi-Vitamin TABS; Therapy: (Recorded:23Oct2014) to 13. Naproxen 500 MG Oral Tablet; Therapy: (Recorded:23Oct2014) to 14. Omega 3 CAPS; Therapy: (Recorded:23Oct2014) to  Allergies Medication  1. No Known Drug Allergies  Family History Problems  1. Family history of  Family Health Status Number Of Children 2. Paternal history of  Prostate Cancer V16.42 3. Maternal history of  Urologic Disorder V18.7  Social History Problems    Family history of  Death In The Family Father   Never A Smoker Denied    Tobacco Use  Review of Systems Genitourinary, constitutional, skin, eye, otolaryngeal, hematologic/lymphatic, cardiovascular, pulmonary, endocrine, musculoskeletal, gastrointestinal, neurological and psychiatric system(s) were reviewed and pertinent findings if present are noted.  Genitourinary: urinary frequency.  Gastrointestinal: abdominal pain.    Vitals Vital Signs [Data Includes: Last 1 Day]  23Oct2014 04:58PM  BMI Calculated: 31.59 BSA Calculated: 1.9 Height: 5 ft 4 in Weight: 185 lb  Blood Pressure: 125 / 75 Temperature: 99.3 F Heart Rate: 91 Respiration: 18  Physical Exam Constitutional: Well nourished and well developed . No acute distress.  ENT:. The ears and nose are normal in appearance.  Neck: The appearance of the neck is normal and no neck mass is  present.  Pulmonary: No respiratory distress and normal respiratory rhythm and effort.  Cardiovascular: Heart rate and rhythm are normal . No peripheral edema.  Abdomen: The abdomen is soft and nontender. No masses are palpated. No CVA  tenderness. No hernias are palpable. No hepatosplenomegaly noted.  Genitourinary:  Chaperone Present: Sima Matas.  Examination of the external genitalia shows normal female external genitalia and no lesions. The urethra is normal in appearance and not tender. There is no urethral mass. Vaginal exam demonstrates no abnormalities. The cervix is is absent. The uterus is absent. The adnexa are palpably normal. The bladder is non tender and not distended. The anus is normal on inspection. The perineum is normal on inspection.  Lymphatics: The femoral and inguinal nodes are not enlarged or tender.  Skin: Normal skin turgor, no visible rash and no visible skin lesions.  Neuro/Psych:. Mood and affect are appropriate.    Results/Data Urine [Data Includes: Last 1 Day]   23Oct2014  COLOR ORANGE   APPEARANCE CLEAR   SPECIFIC GRAVITY >1.030   pH 5.0   GLUCOSE 250 mg/dL  BILIRUBIN MOD   KETONE TRACE mg/dL  BLOOD LARGE   PROTEIN > 300 mg/dL  UROBILINOGEN > 8 mg/dL  NITRITE POS   LEUKOCYTE ESTERASE SMALL   SQUAMOUS EPITHELIAL/HPF RARE   WBC 21-50 WBC/hpf  RBC 3-6 RBC/hpf  BACTERIA RARE   CRYSTALS NONE SEEN   CASTS NONE SEEN     I independently reviewed the CT scan and the findings are as noted above.   Assessment Assessed  1. Urinary Tract Infection 599.0 2. Distal Ureteral Stone On The Right 592.1 3. Proximal Ureteral Stone On The Left 592.1 4. Hydronephrosis 591 5. Nephrolithiasis 592.0  Plan Health Maintenance (V70.0)  1. UA With REFLEX  Done: 23Oct2014 04:45PM Nephrolithiasis (592.0)  2. Morphine Sulfate 15 MG/ML Injection Solution; INFUSE 1 ML Intramuscular; Done: 23Oct2014  05:33PM; Status: COMPLETE - Retrospective Authorization Urinary Tract Infection (599.0)  3. CefTRIAXone Sodium 500 MG Injection Solution Reconstituted; INFUSE 500 MG Intramuscular;  Done: 23Oct2014 05:31PM; Status: COMPLETE - Retrospective Authorization 4. URINE CULTURE   I gave her 15 mgm morphine for  pain and 500 mgm Rocephin for UTI.  Treatment options were discussed with her and her husband. The options are ureteroscopy with stone manipulation right distal ureteral stone and left JJ stent with ESL at a later date.  The other option is bilateral stone manipulation.  Since she has such a high white count and the urine is abnormal will place JJ stent on the left and consider right ureteral stone manipulation since the stone is distal.  The risks of the procedure include but are not limited to hemorrhage, infection, ureteral injury, inability to extract the stone.  They understand and wish to proceed.

## 2013-06-17 NOTE — Anesthesia Postprocedure Evaluation (Signed)
Anesthesia Post Note  Patient: [name] [name]  Procedure(s) Performed: Procedure(s) (LRB): CYSTOSCOPY WITH RETROGRADE PYELOGRAM, URETEROSCOPY AND LEFT DOUBLE  J STENT PLACEMENT RIGHT URETERAL HOLMIIUM LASER AND DOUBLE J STENT  (Bilateral) HOLMIUM LASER APPLICATION (Bilateral)  Anesthesia type: General  Patient location: PACU  Post pain: Pain level controlled  Post assessment: Post-op Vital signs reviewed  Last Vitals: BP 143/60  Pulse 82  Temp(Src) 36.6 C (Oral)  Resp 14  Wt 183 lb (83.008 kg)  BMI 31.4 kg/m2  SpO2 94%  LMP 08/26/1995  Post vital signs: Reviewed  Level of consciousness: sedated  Complications: No apparent anesthesia complications

## 2013-06-17 NOTE — Transfer of Care (Signed)
Immediate Anesthesia Transfer of Care Note  Patient: [name] [name]  Procedure(s) Performed: Procedure(s) (LRB): CYSTOSCOPY WITH RETROGRADE PYELOGRAM, URETEROSCOPY AND LEFT DOUBLE  J STENT PLACEMENT RIGHT URETERAL HOLMIIUM LASER AND DOUBLE J STENT  (Bilateral) HOLMIUM LASER APPLICATION (Bilateral)  Patient Location: PACU  Anesthesia Type: General  Level of Consciousness: awake, sedated, patient cooperative and responds to stimulation  Airway & Oxygen Therapy: Patient Spontanous Breathing and Patient connected to face mask oxygen  Post-op Assessment: Report given to PACU RN, Post -op Vital signs reviewed and stable and Patient moving all extremities  Post vital signs: Reviewed and stable  Complications: No apparent anesthesia complications

## 2013-06-17 NOTE — Anesthesia Procedure Notes (Addendum)
Procedure Name: LMA Insertion Date/Time: 06/17/2013 2:44 PM Performed by: Jessica Priest Pre-anesthesia Checklist: Patient identified, Emergency Drugs available, Suction available and Patient being monitored Patient Re-evaluated:Patient Re-evaluated prior to inductionOxygen Delivery Method: Circle System Utilized Preoxygenation: Pre-oxygenation with 100% oxygen Intubation Type: IV induction Ventilation: Mask ventilation without difficulty LMA: LMA inserted LMA Size: 4.0 Number of attempts: 1 Airway Equipment and Method: bite block Placement Confirmation: positive ETCO2 Tube secured with: Tape Dental Injury: Teeth and Oropharynx as per pre-operative assessment    Procedure Name: Intubation Date/Time: 06/17/2013 2:45 PM Performed by: Jessica Priest Pre-anesthesia Checklist: Patient identified, Emergency Drugs available, Suction available and Patient being monitored Patient Re-evaluated:Patient Re-evaluated prior to inductionOxygen Delivery Method: Circle System Utilized Preoxygenation: Pre-oxygenation with 100% oxygen Intubation Type: IV induction Ventilation: Mask ventilation without difficulty Laryngoscope Size: Mac and 4 Grade View: Grade III Tube type: Oral Tube size: 7.0 mm Number of attempts: 1 Airway Equipment and Method: stylet and oral airway Placement Confirmation: ETT inserted through vocal cords under direct vision,  positive ETCO2 and breath sounds checked- equal and bilateral Secured at: 22 cm Tube secured with: Tape Dental Injury: Teeth and Oropharynx as per pre-operative assessment

## 2013-06-17 NOTE — Op Note (Addendum)
[name] [name] is a 67 y.o.   06/17/2013  General  Preop diagnosis: Right distal ureteral calculus, right hydronephrosis. Left proximal ureteral calculus, left hydronephrosis  Postop diagnosis: Same  Procedure done: Meatal dilation. Cystoscopy, bilateral retrograde pyelograms, right ureteral stone extraction and insertion of left double-J stent  Surgeon: Wendie Simmer. Tracyann Duffell  Anesthesia: General  Indication: Patient is a 67 years old female who was seen in the office yesterday afternoon with a history of severe right lower quadrant pain. The pain started about a week ago. She was seen at the Urgent Care on Monday and was started on antibiotics for urinary tract infection. The pain has persisted. She went back to the Urgent Care yesterday. A CT scan stone protocol showed bilateral nonobstructing renal calculi, a 5 mm right distal ureteral calculus and a 7 mm left proximal ureteral calculus with bilateral mild hydronephrosis. She has a white count of 50,000 and is scheduled to see Dr. Clelia Croft for further evaluation. She has continued to complain of pain. Urinalysis shows a 21-50 WBCs, 3-6 RBCs, and is nitrite positive with small leukocyte esterase. I discussed the treatment options with her and her husband. I think the best management at this time is to attempt to remove the right distal ureteral calculus and placed a left double-J stent. The proximal ureteral calculus would be treated at a later date with ESL. In view of her elevated white count I think it is best not to manipulate the left proximal ureteral calculus. They understand and are agreeable  Procedure: The patient was identified by her wrist band and proper timeout was taken.  Under general anesthesia she was prepped and draped and placed in the dorsolithotomy position. I could not pass a cystoscope in the bladder because of meatal stenosis. I dilated the meatus up to 26 [place]. The panendoscope was then inserted in the bladder. The bladder  mucosa is normal. There is no stone or tumor in the bladder. The ureteral orifices are in normal position and shape.  Right retrograde pyelogram:  A cone-tip catheter was passed through the cystoscope and the right ureteral orifice. Contrast was then injected through the cone-tip catheter. There is a filling defect in the ureter just above the ureteral vesicle junction. The ureter proximal to the filling defect is moderately dilated. I did not inject the contrast under any pressure to visualize the proximal ureter and the renal pelvis. The cone-tip catheter was removed. A sensor wire was passed through the cystoscope and the right ureter and the cystoscope was removed.  A semirigid ureteroscope was passed in the bladder but could not be advanced through the intramural ureter. The ureteroscope was removed. The intramural ureter was dilated with the inner sheath of a ureteroscope access sheath. The access sheath was removed. The ureteroscope was then passed in the bladder and through the ureter without difficulty. The stone was identified in the distal ureter. A Nitinol basket was passed through the ureteroscope and the stone was caught within the wires of the basket and the stone was extracted under direct vision. The sensor wire was then backloaded into the cystoscope. A #6 French-24 Polaris stent was passed over the sensor wire. When the tip of the stent was noted in the renal pelvis the sensor wire was removed. The proximal curl of the stent is in the renal pelvis and the loops of the stent are in the bladder. A string was left attached to the Polaris stent.  Left retrograde pyelogram:  A cone-tip catheter was  passed through the cystoscope and the left ureteral orifice. Contrast was then injected through the cone-tip catheter. The distal and mid ureter are normal. There is a filling defect in the proximal ureter near the UPJ. The renal pelvis and calyces are mildly dilated.  A #6 French-24 Polaris  stent was passed over the sensor wire. After removal of from the sensor wire the proximal curl of the double-J stent is in the renal pelvis and the loops of the stent are in the bladder. There is no string attached to the left stent. The bladder was then emptied and the cystoscope was removed..  Patient tolerated the procedure well and left the OR in satisfactory condition to postanesthesia care unit   CC: Dr. Lesle Chris

## 2013-06-17 NOTE — Telephone Encounter (Signed)
Pt will call back to reschedule appt

## 2013-06-20 ENCOUNTER — Encounter (HOSPITAL_BASED_OUTPATIENT_CLINIC_OR_DEPARTMENT_OTHER): Payer: Self-pay | Admitting: Urology

## 2013-06-20 ENCOUNTER — Telehealth: Payer: Self-pay | Admitting: Oncology

## 2013-06-20 NOTE — Telephone Encounter (Signed)
emailed Mcrea to r/s cx appt

## 2013-06-21 ENCOUNTER — Other Ambulatory Visit: Payer: Self-pay | Admitting: Family Medicine

## 2013-06-21 ENCOUNTER — Telehealth: Payer: Self-pay | Admitting: Oncology

## 2013-06-21 ENCOUNTER — Other Ambulatory Visit: Payer: Self-pay | Admitting: Emergency Medicine

## 2013-06-21 NOTE — Telephone Encounter (Signed)
pt husband called to r/s cx new pt appt.Marland Kitchen.forwarded to [facility]. VM

## 2013-06-21 NOTE — Telephone Encounter (Signed)
Ok with xanax.  NTBS for more abx.  Has been in hospital, uti, kidney stone and elevated wbc.  I need to see her to rx more abx.

## 2013-06-21 NOTE — Telephone Encounter (Signed)
OK refill?? 

## 2013-06-22 ENCOUNTER — Telehealth: Payer: Self-pay

## 2013-06-22 DIAGNOSIS — H9192 Unspecified hearing loss, left ear: Secondary | ICD-10-CM

## 2013-06-22 NOTE — Telephone Encounter (Signed)
Spoke to Dr Cleta Alberts We can refer to ENT this is done Urology should advise on the continued use of the cipro I called [facility] to see what Dr Brunilda Payor advises regarding this. Spoke to his nurse Windell Moulding and she has advised patient no longer needs the Cipro, per Dr Brunilda Payor, she has finished and does not need any additional Cipro.  She has rescheduled with the cancer center for next week.

## 2013-06-22 NOTE — Telephone Encounter (Signed)
Pt husband is calling and wants to speak to a dr copland or dr Cleta Alberts. He said that someone can just call and speak to his wife as well, she is worried is what the husband said Call back number is [phone]

## 2013-06-22 NOTE — Telephone Encounter (Signed)
What is going on? Called her and she is confused and worried about her hearing.She indicates she started with pneumonia, and went home, noticed left ear has decreased hearing. 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  She also wants to know if she could continue her cipro. She did have stents and finished the cipro yesterday.

## 2013-06-24 ENCOUNTER — Other Ambulatory Visit: Payer: Self-pay | Admitting: Urology

## 2013-06-25 ENCOUNTER — Other Ambulatory Visit: Payer: Self-pay | Admitting: Obstetrics & Gynecology

## 2013-06-25 HISTORY — PX: LITHOTRIPSY: SUR834

## 2013-06-27 ENCOUNTER — Encounter (HOSPITAL_COMMUNITY): Payer: Self-pay | Admitting: Pharmacy Technician

## 2013-06-27 NOTE — Telephone Encounter (Signed)
According to AEX not 04/11/13 patient sees PCP for labs please advise?

## 2013-06-27 NOTE — Telephone Encounter (Signed)
Can you get me her old chart please?  Thanks.

## 2013-06-28 NOTE — Progress Notes (Signed)
Spoke to patient via phone,history obtained,updated.  Bring blue folder,insurance cards,picture ID,designated driver and living will,POA, if desires (to be placed on chart). Reinforced no aspirin(instructions to hold aspirin per your doctor), ibuprofen products 72 hours prior to procedure. No vitamins or herbal medicines 7 days prior to procedure.   Follow laxative instructions provided by urologist (office) and in blue folder. Wear easy on/off clothing and no jewelry except wedding rings and ear rings. Leave all other valuables at home. Verbalizes understanding of instructions  Reinforced to arrive at 0545 Jul 04 2013  At  wl short stay instructed to call [facility] TODAY and make arrangements to pick up blue folder Verbalizes understanding of all instructions

## 2013-06-29 ENCOUNTER — Ambulatory Visit (HOSPITAL_BASED_OUTPATIENT_CLINIC_OR_DEPARTMENT_OTHER): Payer: Medicare Other | Admitting: Oncology

## 2013-06-29 ENCOUNTER — Other Ambulatory Visit (HOSPITAL_COMMUNITY)
Admission: RE | Admit: 2013-06-29 | Discharge: 2013-06-29 | Disposition: A | Discharge status: Home / Self Care | Payer: Medicare Other | Source: Ambulatory Visit | Attending: Oncology | Admitting: Oncology

## 2013-06-29 ENCOUNTER — Telehealth: Payer: Self-pay | Admitting: Oncology

## 2013-06-29 ENCOUNTER — Ambulatory Visit (HOSPITAL_BASED_OUTPATIENT_CLINIC_OR_DEPARTMENT_OTHER): Payer: Medicare Other

## 2013-06-29 ENCOUNTER — Other Ambulatory Visit (HOSPITAL_BASED_OUTPATIENT_CLINIC_OR_DEPARTMENT_OTHER): Payer: Medicare Other | Admitting: Lab

## 2013-06-29 ENCOUNTER — Encounter: Payer: Self-pay | Admitting: Oncology

## 2013-06-29 VITALS — BP 141/79 | HR 86 | Temp 98.1°F | Resp 18 | Ht 64.0 in | Wt 188.3 lb

## 2013-06-29 DIAGNOSIS — D72820 Lymphocytosis (symptomatic): Secondary | ICD-10-CM | POA: Insufficient documentation

## 2013-06-29 DIAGNOSIS — D72829 Elevated white blood cell count, unspecified: Secondary | ICD-10-CM

## 2013-06-29 DIAGNOSIS — D649 Anemia, unspecified: Secondary | ICD-10-CM

## 2013-06-29 DIAGNOSIS — Z853 Personal history of malignant neoplasm of breast: Secondary | ICD-10-CM

## 2013-06-29 LAB — CBC WITH DIFFERENTIAL/PLATELET
BASO%: 0.6 % (ref 0.0–2.0)
HCT: 28.1 % — ABNORMAL LOW (ref 34.8–46.6)
MCHC: 31.9 g/dL (ref 31.5–36.0)
MONO#: 0.7 10*3/uL (ref 0.1–0.9)
NEUT#: 5.1 10*3/uL (ref 1.5–6.5)
NEUT%: 33.1 % — ABNORMAL LOW (ref 38.4–76.8)
RBC: 3.21 10*6/uL — ABNORMAL LOW (ref 3.70–5.45)
RDW: 14.7 % — ABNORMAL HIGH (ref 11.2–14.5)
WBC: 15.3 10*3/uL — ABNORMAL HIGH (ref 3.9–10.3)
lymph#: 8.8 10*3/uL — ABNORMAL HIGH (ref 0.9–3.3)

## 2013-06-29 LAB — COMPREHENSIVE METABOLIC PANEL (CC13)
ALT: 24 U/L (ref 0–55)
AST: 26 U/L (ref 5–34)
Albumin: 3.4 g/dL — ABNORMAL LOW (ref 3.5–5.0)
Anion Gap: 12 mEq/L — ABNORMAL HIGH (ref 3–11)
CO2: 23 mEq/L (ref 22–29)
Calcium: 9.2 mg/dL (ref 8.4–10.4)
Chloride: 109 mEq/L (ref 98–109)
Creatinine: 0.8 mg/dL (ref 0.6–1.1)
Potassium: 3.8 mEq/L (ref 3.5–5.1)
Sodium: 143 mEq/L (ref 136–145)
Total Protein: 6.1 g/dL — ABNORMAL LOW (ref 6.4–8.3)

## 2013-06-29 NOTE — Progress Notes (Signed)
Checked in new patient with no financial issues and she has her appt card. She has not traveled to [place].

## 2013-06-29 NOTE — Telephone Encounter (Signed)
Gave pt appt for lab and MD for january 2015

## 2013-06-29 NOTE — Progress Notes (Signed)
Please see consult note.  

## 2013-06-29 NOTE — Consult Note (Signed)
Reason for Referral: Leukocytosis.   HPI: 67 year old woman with past medical history significant for anxiety, depression and breast cancer have been treated in the past and has been in remission. Patient was followed at [facility] for close to 20 years for her breast cancer but have not seen her oncologist for the last 2 years. She was hospitalized briefly for a community acquired pneumonia in the early part of October and she was noted to have leukocytosis with a white cell count 43,000. Her neutrophil percentage was slightly low at 34% and lymphocytes were elevated at 61. Upon her discharge from the hospital her white cell count was as high as 48.9 thousand. Her hemoglobin was around 11.2 and had normal platelets. Patient was referred to me for evaluation of leukocytosis. Looking back at historically she had normal white cell count of 2009 but in 2011 2012th she had some mild lymphocytosis with a total white cell count ranged between 13 and 15,000. She also have recently was diagnosed with kidney stones and had CT scan of the abdomen and pelvis without contrast which did not reveal any lymphadenopathy or splenomegaly.  Clinically, she is asymptomatic. She is not reporting any fevers or chills or sweats or any constitutional symptoms. She is not reporting any headaches blurred vision double vision or change in her mentation. Had some episodic memory and confusion episodes and was evaluated by neurology in the past. She have had a an MRI of the brain last year. She has not reported any chest pain shortness of breath or difficulty breathing. Isn't reporting any cough or hemoptysis or hematemesis. Has not reported any changes in her appetite and has gained weight. Has not reported any abdominal distention or early satiety. Has not reported any bleeding or thrombosis. Has not reported any neurological symptoms. She continued to perform activities of daily living without any hindrance or decline.   Past Medical  History  Diagnosis Date  . Sinus tachycardia     mild resting  . HLD (hyperlipidemia)   . Orthostatic hypotension     slight in the past  . Anxiety   . Depression   . Diverticulosis   . Fibromyalgia 1978  . Hypothyroidism   . Alopecia   . Ruptured disk     one in neck and two in back  . Plantar fasciitis   . Arthritis   . DDD (degenerative disc disease)   . History of breast cancer     1997   s/p  right breast lumpectomy w/ node dissection  . GERD (gastroesophageal reflux disease)   . CAP (community acquired pneumonia)     admission 06-04-2013 secondary to failed oral antibitotics---  last cxr 06-16-2013  much improved  . History of syncope     episode 2008--  no recurrence since  . Ureteral calculi     bilateral  . Leukocytosis   . Chronic back pain   . 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 ear   . SOB (shortness of breath) 06-17-2013 CURRENTLY RESIDUAL FROM RECENT CAP    SOB from obesity and deconditioning 2009- eval included CPX  . Urgency of urination   . Hematuria   . History of kidney stones   . Sensation of pressure in bladder area   . Wears glasses   . Ecchymosis     FROM IV AND LAB WORK THE PAST WEEK  06-17-2013  :  Past Surgical History  Procedure Laterality Date  . Lumbar epidural injection      has had 7  injections  . Total abdominal hysterectomy w/ bilateral salpingoophorectomy  1997  . Breast lumpectomy with axillary lymph node dissection Right 1997  . Transthoracic echocardiogram  05-11-2007  dr Myrtis Ser    normal lvf/  ef 65%  . Tonsillectomy  AGE 53  . Retinal detachment surgery Right 2013  . Cystoscopy with retrograde pyelogram, ureteroscopy and stent placement Bilateral 06/17/2013    Procedure: CYSTOSCOPY WITH RETROGRADE PYELOGRAM, URETEROSCOPY AND LEFT DOUBLE  J STENT PLACEMENT RIGHT URETERAL HOLMIIUM LASER AND DOUBLE J STENT ;  Surgeon: Lindaann Slough, MD;  Location: [facility] [place];  Service: Urology;  Laterality: Bilateral;  . Holmium laser application  Bilateral 06/17/2013    Procedure: HOLMIUM LASER APPLICATION;  Surgeon: Lindaann Slough, MD;  Location: [facility];  Service: Urology;  Laterality: Bilateral;  :  Current Outpatient Prescriptions  Medication Sig Dispense Refill  . ALPRAZolam (XANAX XR) 0.5 MG 24 hr tablet Take 0.5 mg by mouth daily as needed (aniexty).      Marland Kitchen amitriptyline (ELAVIL) 150 MG tablet Take 150 mg by mouth at bedtime.      Marland Kitchen buPROPion (ZYBAN) 150 MG 12 hr tablet Take 300 mg by mouth 2 (two) times daily.       . cetirizine (ZYRTEC) 10 MG tablet Take 10 mg by mouth daily.      Marland Kitchen gabapentin (NEURONTIN) 300 MG capsule Take 300 mg by mouth daily as needed (sleep). 300mg  with evening meal, 300mg  at bedtime      . HYDROcodone-acetaminophen (NORCO/VICODIN) 5-325 MG per tablet Take 1 tablet by mouth every 6 (six) hours as needed for pain.      Marland Kitchen levothyroxine (SYNTHROID, LEVOTHROID) 50 MCG tablet TAKE ONE TABLET BY MOUTH EVERY DAY  30 tablet  13  . metoprolol succinate (TOPROL-XL) 25 MG 24 hr tablet Take 25 mg by mouth daily.      . Multiple Vitamins-Minerals (MULTIVITAMIN PO) Take 1 tablet by mouth daily.       . naproxen (NAPROSYN) 500 MG tablet Take 500 mg by mouth as needed (pain).       . Omega-3 Fatty Acids (FISH OIL PO) Take 1 capsule by mouth daily.       Marland Kitchen omeprazole (PRILOSEC) 20 MG capsule Take 20 mg by mouth every morning.       . Oxcarbazepine (TRILEPTAL) 300 MG tablet Take 300 mg by mouth 2 (two) times daily.      Marland Kitchen oxybutynin (DITROPAN) 5 MG tablet Take 5 mg by mouth daily as needed (pain).      Marland Kitchen oxyCODONE-acetaminophen (PERCOCET/ROXICET) 5-325 MG per tablet Take 1 tablet by mouth every 4 (four) hours as needed for pain. For pain      . phenazopyridine (PYRIDIUM) 100 MG tablet Take 1 tablet (100 mg total) by mouth 3 (three) times daily as needed for pain. For bladder pain  20 tablet  0  . Vilazodone HCl (VIIBRYD) 20 MG TABS Take 1 tablet by mouth daily.       No current facility-administered  medications for this visit.        Allergies  Allergen Reactions  . Lasix [Furosemide] Nausea And Vomiting and Other (See Comments)    headache  . Lithium Other (See Comments)    dizziness  . Sulfa Antibiotics Hives  . Trazodone And Nefazodone Other (See Comments)    insomnia  . Lyrica [Pregabalin] Diarrhea, Nausea Only and Rash  :  Family History  Problem Relation Age of Onset  . Heart disease  Maternal Grandfather   . Diabetes Maternal Grandfather   . Colon cancer Maternal Aunt   . Other Paternal Grandmother     brain tumor  . Dementia Mother   . Diabetes Mother   . Osteoporosis Mother   . Diabetes Maternal Aunt   . Prostate cancer Father   . Brain cancer Maternal Grandmother   . Bipolar disorder Maternal Aunt   . Stroke Maternal Aunt   :  History   Social History  . Marital Status: Married    Spouse Name: N/A    Number of Children: 1  . Years of Education: N/A   Occupational History  . homemaker    Social History Main Topics  . Smoking status: Never Smoker   . Smokeless tobacco: Never Used  . Alcohol Use: No  . Drug Use: No  . Sexual Activity: Not on file     Comment: TAH/BSO   Other Topics Concern  . Not on file   Social History Narrative  . No narrative on file  :  Pertinent items are noted in HPI.  Exam: ECOG of 1 Blood pressure 141/79, pulse 86, temperature 98.1 F (36.7 C), temperature source Oral, resp. rate 18, height 5\' 4"  (1.626 m), weight 188 lb 4.8 oz (85.412 kg), last menstrual period 08/26/1995, SpO2 98.00%. General appearance: alert, cooperative and appears stated age Head: Normocephalic, without obvious abnormality, atraumatic Eyes: conjunctivae/corneas clear. PERRL, EOM's intact. Fundi benign. Nose: Nares normal. Septum midline. Mucosa normal. No drainage or sinus tenderness. Throat: lips, mucosa, and tongue normal; teeth and gums normal Neck: no adenopathy, no carotid bruit, no JVD, supple, symmetrical, trachea midline and  thyroid not enlarged, symmetric, no tenderness/mass/nodules Back: negative, symmetric, no curvature. ROM normal. No CVA tenderness. Resp: clear to auscultation bilaterally Cardio: regular rate and rhythm, S1, S2 normal, no murmur, click, rub or gallop GI: soft, non-tender; bowel sounds normal; no masses,  no organomegaly Extremities: extremities normal, atraumatic, no cyanosis or edema Pulses: 2+ and symmetric Skin: Skin color, texture, turgor normal. No rashes or lesions Lymph nodes: Cervical, supraclavicular, and axillary nodes normal.   Recent Labs  06/29/13 1023  WBC 15.3*  HGB 8.9*  HCT 28.1*  PLT 169   No results found for this basename: NA, K, CL, CO2, GLUCOSE, BUN, CREATININE, CALCIUM,  in the last 72 hours   Blood smear review: Personally reviewed her peripheral smear today and showed some variant clamps for normal red cells. I did not see any schistocytosis or red cell fragmentation.   Ct Abdomen Pelvis Wo Contrast  06/16/2013   CLINICAL DATA:  Hematuria, urinary frequency, bilateral pelvic pain  EXAM: CT ABDOMEN AND PELVIS WITHOUT CONTRAST  TECHNIQUE: Multidetector CT imaging of the abdomen and pelvis was performed following the standard protocol without intravenous contrast.  COMPARISON:  August 10, 2012  FINDINGS: There is mild left hydronephrosis due to obstruction by a 7.2 mm stone in the proximal left ureter. There is a 4.6 mm nonobstructing stone in the midpole left kidney. There is right hydronephrosis. There is a 5.5 mm stone in the distal right ureter just prior to its insertion into the bladder. There is a 2 mm nonobstructing stone in the midpole right kidney. No focal renal lesions are identified.  The liver, spleen, pancreas, gallbladder, adrenal glands are normal. There is no small bowel obstruction or diverticulitis. The appendix is normal.  The bladder is decompressed limiting evaluation. A the uterus is not seen. Images of visualized lung bases demonstrate  patchy consolidation  of the posterior right lung base more likely due to atelectasis but if the patient has symptoms infection underlying pneumonia is not excluded. Degenerative joint changes of the spine are identified. No acute abnormalities identified within the visualized bones.  IMPRESSION: Right hydroureteronephrosis due to obstruction by 5.5 mm stone in the distal right ureter. Mild left hydronephrosis due to obstruction by a 7.2 mm stone in the proximal left ureter.   Electronically Signed   By: Sherian Rein M.D.   On: 06/16/2013 15:51   Dg Chest 2 View  06/16/2013   CLINICAL DATA:  Pneumonia.  EXAM: CHEST  2 VIEW  COMPARISON:  June 10, 2013.  FINDINGS: Cardiomediastinal silhouette appears normal. Surgical clips are noted in right axillary region. Right lung is clear. Left basilar opacity is significantly improved with minimal linear density seen lateral most consistent with scarring.  IMPRESSION: Significant improvement in left lower lobe opacity is noted with only minimal residual density remaining which may represent scarring. Continued radiographic followup is recommended until resolution. Right lung is clear.   Electronically Signed   By: Roque Lias M.D.   On: 06/16/2013 14:21     Assessment and Plan:   67 year old woman with the following issues:  1. Leukocytosis with lymphocytosis. She clearly has an element of reactive leukocytosis especially with white cell count that was elevated to his house is 48,000. Her white cell count is down to 15,000 based on her laboratory testing today. I do think inflammation due to pneumonia as well as her kidney stone have probably exacerbated her leukocytosis. It is very possible that she could have a lymphoproliferative disorder causing her lymphocytosis and her mild neutropenia. Conditions such as CLL, non-Hodgkin's lymphoma, and others are to be considered. Her imaging studies were reviewed today and did not suggest any evidence of any  lymphadenopathy or splenomegaly. But for completeness sake, I will obtain peripheral blood flow cytometry to better quantify her lymphocytes. If she has CLL we will put her on active surveillance and proceed from there but if she has no monoclonal population that really no further evaluation is needed. Clearly, she has a different lymphoproliferative disorder on the management will be depending on that. I will communicate the results of her workup once has been completed.  2. Mild anemia the hemoglobin of 8.9 an elevated RDW her MCV is about 87.5. This could be multifactorial related to her recent hospitalization may be an element of iron deficiency could also be related to a lymphoproliferative disorder. We'll continue to monitor this as well.  3. History of breast cancer. The details are not available to me but per her report she had right-sided breast cancer 1997. She is status post lumpectomy followed by radiation therapy and 5 years of tamoxifen. She has no evidence to suggest recurrent or relapsed disease at this time.

## 2013-07-01 LAB — FLOW CYTOMETRY

## 2013-07-04 ENCOUNTER — Encounter (HOSPITAL_COMMUNITY)
Admission: RE | Disposition: A | Discharge status: Home / Self Care | Payer: Self-pay | Source: Ambulatory Visit | Attending: Urology

## 2013-07-04 ENCOUNTER — Ambulatory Visit (HOSPITAL_COMMUNITY): Payer: Medicare Other

## 2013-07-04 ENCOUNTER — Encounter (HOSPITAL_COMMUNITY): Payer: Self-pay | Admitting: General Practice

## 2013-07-04 ENCOUNTER — Ambulatory Visit (HOSPITAL_COMMUNITY)
Admission: RE | Admit: 2013-07-04 | Discharge: 2013-07-04 | Disposition: A | Discharge status: Home / Self Care | Payer: Medicare Other | Source: Ambulatory Visit | Attending: Urology | Admitting: Urology

## 2013-07-04 DIAGNOSIS — D72829 Elevated white blood cell count, unspecified: Secondary | ICD-10-CM | POA: Insufficient documentation

## 2013-07-04 DIAGNOSIS — Z853 Personal history of malignant neoplasm of breast: Secondary | ICD-10-CM | POA: Insufficient documentation

## 2013-07-04 DIAGNOSIS — N201 Calculus of ureter: Secondary | ICD-10-CM | POA: Insufficient documentation

## 2013-07-04 DIAGNOSIS — N200 Calculus of kidney: Secondary | ICD-10-CM | POA: Insufficient documentation

## 2013-07-04 DIAGNOSIS — Z79899 Other long term (current) drug therapy: Secondary | ICD-10-CM | POA: Insufficient documentation

## 2013-07-04 SURGERY — LITHOTRIPSY, ESWL
Anesthesia: LOCAL | Laterality: Left

## 2013-07-04 MED ORDER — CIPROFLOXACIN HCL 500 MG PO TABS
500.0000 mg | ORAL_TABLET | ORAL | Status: AC
  Administered 2013-07-04: 500 mg via ORAL
  Filled 2013-07-04: qty 1

## 2013-07-04 MED ORDER — DIPHENHYDRAMINE HCL 25 MG PO CAPS
25.0000 mg | ORAL_CAPSULE | ORAL | Status: AC
  Administered 2013-07-04: 25 mg via ORAL
  Filled 2013-07-04: qty 1

## 2013-07-04 MED ORDER — DEXTROSE-NACL 5-0.45 % IV SOLN
INTRAVENOUS | Status: DC
  Administered 2013-07-04: 07:00:00 via INTRAVENOUS

## 2013-07-04 MED ORDER — DIAZEPAM 5 MG PO TABS
10.0000 mg | ORAL_TABLET | ORAL | Status: AC
  Administered 2013-07-04: 10 mg via ORAL
  Filled 2013-07-04: qty 2

## 2013-07-04 NOTE — H&P (Signed)
History of Present Illness [name] had right distal ureteral stone extraction on 06/17/13. She also has a left proximal ureteral calculus. She had bilateral JJ stents. The right JJ stent is removed today. She does not have any more pain. She has frequency, urgency. She is on phenazopyridine and oxybutynin. She needs ESL of the left proximal ureteral calculus. She has an appointment next week with Dr [name] [name] for elevated white blood count.   Past Medical History Problems  1. History of Anxiety (Symptom) (799.2) 2. History of Arthritis (V13.4) 3. History of Asthma (493.90) 4. History of Breast Cancer (V10.3) 5. History of Fibromyalgia (729.1) 6. History of depression (V11.8) 7. History of esophageal reflux (V12.79)  Surgical History Problems  1. History of Breast Surgery 2. History of Corneal LASIK Bilateral 3. History of Cystoscopy With Insertion Of Ureteral Stent Bilateral 4. History of Cystoscopy With Ureteroscopy With Removal Of Calculus 5. History of Hysterectomy 6. History of Hysterectomy 7. History of Lymphadenectomy  Current Meds 1. ALPRAZolam 0.5 MG Oral Tablet;  Therapy: (Recorded:23Oct2014) to Recorded 2. Amitriptyline HCl TABS;  Therapy: (Recorded:23Oct2014) to Recorded 3. Aspirin 81 MG Oral Tablet;  Therapy: (Recorded:23Oct2014) to Recorded 4. Augmentin 875-125 MG Oral Tablet (Amoxicillin-Pot Clavulanate);  Therapy: (Recorded:23Oct2014) to Recorded 5. Benzonatate 100 MG Oral Capsule;  Therapy: (Recorded:23Oct2014) to Recorded 6. Biotin Forte TABS;  Therapy: (Recorded:23Oct2014) to Recorded 7. BuPROPion HCl ER (SR) 150 MG Oral Tablet Extended Release 12 Hour;  Therapy: (Recorded:23Oct2014) to Recorded 8. Calcium/Vitamin D/Minerals TABS;  Therapy: (Recorded:23Oct2014) to Recorded 9. Gabapentin 300 MG Oral Capsule;  Therapy: (Recorded:23Oct2014) to Recorded 10. Levothyroxine Sodium 50 MCG Oral Tablet;   Therapy: (Recorded:23Oct2014) to Recorded 11. Metoprolol  Tartrate 25 MG Oral Tablet;   Therapy: (Recorded:23Oct2014) to Recorded 12. Multi-Vitamin TABS;   Therapy: (Recorded:23Oct2014) to Recorded 13. Naproxen 500 MG Oral Tablet;   Therapy: (Recorded:23Oct2014) to Recorded 14. Omega 3 CAPS;   Therapy: (Recorded:23Oct2014) to Recorded  Allergies Medication  1. No Known Drug Allergies  Family History Problems  1. Family history of Family Health Status Number Of Children   1 daughter 2. Family history of Prostate Cancer (Z61.09) : Father 3. Family history of Urologic Disorder (V18.7) : Mother   kidney stones  Social History Problems  1. Family history of Death In The Family Father   90 2. Never A Smoker 3. Denied: Tobacco Use  Review of Systems Genitourinary, constitutional, skin, eye, otolaryngeal, hematologic/lymphatic, cardiovascular, pulmonary, endocrine, musculoskeletal, gastrointestinal, neurological and psychiatric system(s) were reviewed and pertinent findings if present are noted.  Genitourinary: urinary frequency, urinary urgency and dysuria.    Vitals Vital Signs [Data Includes: Last 1 Day]  Recorded: 30Oct2014 01:59PM  Blood Pressure: 132 / 79 Temperature: 98.3 F Heart Rate: 102 Respiration: 18  Physical Exam Constitutional: Well nourished and well developed . No acute distress.  ENT:. The ears and nose are normal in appearance.  Neck: The appearance of the neck is normal and no neck mass is present.  Pulmonary: No respiratory distress and normal respiratory rhythm and effort.  Cardiovascular: Heart rate and rhythm are normal . No peripheral edema.  Abdomen: The abdomen is soft and nontender. No masses are palpated. No CVA tenderness. No hernias are palpable. No hepatosplenomegaly noted.  Genitourinary:  Chaperone Present: .  Examination of the external genitalia shows normal female external genitalia and no lesions. The urethra is normal in appearance and not tender. There is no urethral mass. Vaginal exam  demonstrates no abnormalities. The adnexa are palpably normal. The bladder  is non tender and not distended. The anus is normal on inspection. The perineum is normal on inspection.  Lymphatics: The femoral and inguinal nodes are not enlarged or tender.  Skin: Normal skin turgor, no visible rash and no visible skin lesions.  Neuro/Psych:. Mood and affect are appropriate.    Results/Data Urine [Data Includes: Last 1 Day]   30Oct2014  COLOR RED   APPEARANCE CLOUDY   SPECIFIC GRAVITY 1.010   pH 7.0   GLUCOSE NEG mg/dL  BILIRUBIN NEG   KETONE TRACE mg/dL  BLOOD LARGE   PROTEIN > 300 mg/dL  UROBILINOGEN 1 mg/dL  NITRITE POS   LEUKOCYTE ESTERASE MOD   SQUAMOUS EPITHELIAL/HPF RARE   WBC 3-6 WBC/hpf  RBC TNTC RBC/hpf  BACTERIA RARE   CRYSTALS NONE SEEN   CASTS NONE SEEN    The stent was removed by pulling on the string.   Assessment Assessed  1. Calculus of left ureter (592.1) 2. Nephrolithiasis (592.0)  End of Encounter Meds  Medication Name Instruction  ALPRAZolam 0.5 MG Oral Tablet   Amitriptyline HCl TABS   Aspirin 81 MG Oral Tablet   Augmentin 875-125 MG Oral Tablet (Amoxicillin-Pot Clavulanate)   Benzonatate 100 MG Oral Capsule   Biotin Forte TABS   BuPROPion HCl ER (SR) 150 MG Oral Tablet Extended Release 12 Hour   Calcium/Vitamin D/Minerals TABS   Gabapentin 300 MG Oral Capsule   Levothyroxine Sodium 50 MCG Oral Tablet   Metoprolol Tartrate 25 MG Oral Tablet   Multi-Vitamin TABS   Naproxen 500 MG Oral Tablet   Omega 3 CAPS    Plan Calculus of left ureter  1. Follow-up Schedule Surgery Office  Follow-up  Status: Hold For - Appointment   Requested for: 30Oct2014 Health Maintenance  2. UA With REFLEX; Status:Resulted - Requires Verification;   Done: 30Oct2014 01:25PM  ESL left proximal ureteral calculus. The procedure, risks, benefits were explained to the patient. The risks include but are not limited to hemorrhage, renal or perirenal hematoma, steinstrasse,  inability to fragment the stone. She understands and wishes to proceed.

## 2013-07-05 ENCOUNTER — Encounter (HOSPITAL_BASED_OUTPATIENT_CLINIC_OR_DEPARTMENT_OTHER): Payer: Self-pay | Admitting: Urology

## 2013-07-11 ENCOUNTER — Telehealth: Payer: Self-pay

## 2013-07-11 NOTE — Telephone Encounter (Signed)
Called patient and let her know I have been receiving correspondence from Dr. Brunilda Payor. Call and tell her I hope she continues to feel better after the procedures

## 2013-07-11 NOTE — Telephone Encounter (Signed)
PT WANTED THE DR TO KNOW THEY TOOK OUT 2 KIDNEY STONES AND ONE WAS 7 MIL AND THE OTHER WAS 5 MIL, THEY HAD TO CRUSH ONE OF THEM STATES SHE DIDN'T NEED A CALL BACK JUST WANTED [place] TO KNOW, HER NUMBER IS [phone] IF NEEDED

## 2013-07-11 NOTE — Telephone Encounter (Signed)
Thanks. Called her to advise. Left message.

## 2013-07-12 ENCOUNTER — Other Ambulatory Visit: Payer: Self-pay | Admitting: Family Medicine

## 2013-07-13 NOTE — Telephone Encounter (Signed)
?   OK to Refill  

## 2013-07-14 NOTE — Telephone Encounter (Signed)
ok 

## 2013-07-18 ENCOUNTER — Encounter (INDEPENDENT_AMBULATORY_CARE_PROVIDER_SITE_OTHER): Payer: Self-pay | Admitting: Surgery

## 2013-07-18 ENCOUNTER — Encounter (INDEPENDENT_AMBULATORY_CARE_PROVIDER_SITE_OTHER): Payer: Self-pay

## 2013-07-18 ENCOUNTER — Ambulatory Visit (INDEPENDENT_AMBULATORY_CARE_PROVIDER_SITE_OTHER): Payer: Medicare Other | Admitting: Surgery

## 2013-07-18 VITALS — BP 110/64 | HR 76 | Temp 98.0°F | Resp 14 | Ht 64.0 in | Wt 177.0 lb

## 2013-07-18 DIAGNOSIS — C50919 Malignant neoplasm of unspecified site of unspecified female breast: Secondary | ICD-10-CM

## 2013-07-18 DIAGNOSIS — C50911 Malignant neoplasm of unspecified site of right female breast: Secondary | ICD-10-CM

## 2013-07-18 NOTE — Progress Notes (Signed)
Patient ID: [name] [name], female   DOB: 04/16/1946, 67 y.o.   MRN: [id number]  Chief Complaint  Patient presents with  . New Evaluation    new breast ca    HPI [name] [name] is a 67 y.o. female.  Referred by Dr. 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 This surgery was performed in 1997. She underwent postoperative radiation as well as tamoxifen x5 years. She did not receive chemotherapy. She had been doing well for the last several years  And recently had a normal screening mammogram in February of 2014. In November she felt a small lump in her right breast but she states that this has since resolved. However secondary to feeling this lump she had an ultrasound and mammogram on 07/13/13. The ultrasound showed a 1.4 cm oval mass at 8:00. The diagnostic mammogram was unremarkable. 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  She denies any right arm swelling or numbness at this time.  She was recently diagnosed with CLL and is followed by Dr. Firas Shadad.  Past Medical History  Diagnosis Date  . Sinus tachycardia     mild resting  . HLD (hyperlipidemia)   . Orthostatic hypotension     slight in the past  . Anxiety   . Depression   . Diverticulosis   . Fibromyalgia 1978  . Hypothyroidism   . Alopecia   . Ruptured disk     one in neck and two in back  . Plantar fasciitis   . Arthritis   . DDD (degenerative disc disease)   . History of breast cancer     1997   s/p  right breast lumpectomy w/ node dissection  . GERD (gastroesophageal reflux disease)   . CAP (community acquired pneumonia)     admission 06-04-2013 secondary to failed oral antibitotics---  last cxr 06-16-2013  much improved  . History of syncope     episode 2008--  no recurrence since  . Ureteral calculi      bilateral  . Leukocytosis   . Chronic back pain   . 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 ear   . SOB (shortness of breath) 06-17-2013 CURRENTLY RESIDUAL FROM RECENT CAP    SOB from obesity and deconditioning 2009- eval included CPX  . Urgency of urination   . Hematuria   . History of kidney stones   . Sensation of pressure in bladder area   . Wears glasses   . Ecchymosis     FROM IV AND LAB WORK THE PAST WEEK  06-17-2013    Past Surgical History  Procedure Laterality Date  . Lumbar epidural injection      has had 7 injections  . Total abdominal hysterectomy w/ bilateral salpingoophorectomy  1997  . Breast lumpectomy with axillary lymph node dissection Right 1997  . Transthoracic echocardiogram  05-11-2007  dr katz    normal lvf/  ef 65%  . Tonsillectomy  AGE 15  . Retinal detachment surgery Right 2013  . Cystoscopy with retrograde pyelogram, ureteroscopy and stent placement Bilateral 06/17/2013    Procedure: CYSTOSCOPY WITH RETROGRADE PYELOGRAM, URETEROSCOPY AND LEFT DOUBLE  J STENT PLACEMENT RIGHT URETERAL HOLMIIUM LASER AND DOUBLE J STENT ;  Surgeon: Marc-Henry Nesi, MD;  Location: [facility];  Service: Urology;  Laterality: Bilateral;  . Holmium laser application Bilateral   06/17/2013    Procedure: HOLMIUM LASER APPLICATION;  Surgeon: Marc-Henry Nesi, MD;  Location: [facility];  Service: Urology;  Laterality: Bilateral;    Family History  Problem Relation Age of Onset  . Heart disease Maternal Grandfather   . Diabetes Maternal Grandfather   . Colon cancer Maternal Aunt   . Other Paternal Grandmother     brain tumor  . Dementia Mother   . Diabetes Mother   . Osteoporosis Mother   . Diabetes Maternal Aunt   . Prostate cancer Father   . Brain cancer Maternal Grandmother   . Bipolar disorder Maternal Aunt   . Stroke Maternal Aunt     Social History History  Substance Use Topics  . Smoking status: Never Smoker   . Smokeless tobacco: Never Used  .  Alcohol Use: No    Allergies  Allergen Reactions  . Amoxicillin Itching  . Lasix [Furosemide] Nausea And Vomiting and Other (See Comments)    headache  . Lithium Other (See Comments)    dizziness  . Sulfa Antibiotics Hives  . Trazodone And Nefazodone Other (See Comments)    insomnia  . Lyrica [Pregabalin] Diarrhea, Nausea Only and Rash    Current Outpatient Prescriptions  Medication Sig Dispense Refill  . amitriptyline (ELAVIL) 150 MG tablet Take 150 mg by mouth at bedtime.      . buPROPion (ZYBAN) 150 MG 12 hr tablet Take 300 mg by mouth 2 (two) times daily.       . levothyroxine (SYNTHROID, LEVOTHROID) 50 MCG tablet TAKE ONE TABLET BY MOUTH EVERY DAY  30 tablet  13  . metoprolol succinate (TOPROL-XL) 25 MG 24 hr tablet Take 25 mg by mouth daily.      . Multiple Vitamins-Minerals (MULTIVITAMIN PO) Take 1 tablet by mouth daily.       . Omega-3 Fatty Acids (FISH OIL PO) Take 1 capsule by mouth daily.       . omeprazole (PRILOSEC) 20 MG capsule Take 20 mg by mouth every morning.       . Oxcarbazepine (TRILEPTAL) 300 MG tablet Take 300 mg by mouth 2 (two) times daily.      . Vilazodone HCl (VIIBRYD) 20 MG TABS Take 1 tablet by mouth daily.      . ALPRAZolam (XANAX) 0.5 MG tablet TAKE 1 TABLET BY MOUTH EVERY 8 HOURS AS NEEDED  30 tablet  0  . gabapentin (NEURONTIN) 300 MG capsule Take 300 mg by mouth daily as needed (sleep). 300mg with evening meal, 300mg at bedtime      . oxyCODONE-acetaminophen (PERCOCET/ROXICET) 5-325 MG per tablet Take 1 tablet by mouth every 4 (four) hours as needed for pain. For pain       No current facility-administered medications for this visit.    Review of Systems Review of Systems  Constitutional: Negative for fever, chills and unexpected weight change.  HENT: Negative for congestion, hearing loss, sore throat, trouble swallowing and voice change.   Eyes: Negative for visual disturbance.  Respiratory: Negative for cough and wheezing.   Cardiovascular:  Negative for chest pain, palpitations and leg swelling.  Gastrointestinal: Negative for nausea, vomiting, abdominal pain, diarrhea, constipation, blood in stool, abdominal distention and anal bleeding.  Genitourinary: Negative for hematuria, vaginal bleeding and difficulty urinating.  Musculoskeletal: Positive for arthralgias and myalgias.  Skin: Negative for rash and wound.  Neurological: Negative for seizures, syncope and headaches.  Hematological: Negative for adenopathy. Does not bruise/bleed easily.  Psychiatric/Behavioral: Negative for confusion. The patient   is nervous/anxious.     Blood pressure 110/64, pulse 76, temperature 98 F (36.7 C), temperature source Temporal, resp. rate 14, height 5' 4" (1.626 m), weight 177 lb (80.287 kg), last menstrual period 08/26/1995.  Physical Exam Physical Exam WDWN in NAD HEENT:  EOMI, sclera anicteric Neck:  No masses, no thyromegaly Lungs:  CTA bilaterally; normal respiratory effort Breasts:  Smaller on right; healed axillary and lumpectomy incisions; no palpable masses in either breast; no axillary lymphadenopathy CV:  Regular rate and rhythm; no murmurs Abd:  +bowel sounds, soft, non-tender, no masses Ext:  Well-perfused; no edema Skin:  Warm, dry; no sign of jaundice  Data Reviewed Mammogram/ U/S from [facility] report  Assessment    Recurrent right breast cancer s/p R lumpectomy/ lymph node dissection/ XRT, tamoxifen.  Also with recent diagnosis of CLL.     Plan    Recommend a completion right mastectomy. Since she has had a full lymph node dissection followed by radiation, I do not feel that there is any benefit to attempting sentinel lymph node biopsy as it is very likely to be unsuccessful. We will attempt to find some lymph nodes in the axillary tail of the breast.The surgical procedure has been discussed with the patient.  Potential risks, benefits, alternative treatments, and expected outcomes have been explained.  All of the  patient's questions at this time have been answered.  The likelihood of reaching the patient's treatment goal is good.  The patient understand the proposed surgical procedure and wishes to proceed.         Adisynn Suleiman K. 07/18/2013, 12:20 PM    

## 2013-07-20 ENCOUNTER — Other Ambulatory Visit (INDEPENDENT_AMBULATORY_CARE_PROVIDER_SITE_OTHER): Payer: Self-pay | Admitting: Surgery

## 2013-07-20 ENCOUNTER — Telehealth: Payer: Self-pay | Admitting: *Deleted

## 2013-07-20 ENCOUNTER — Other Ambulatory Visit (INDEPENDENT_AMBULATORY_CARE_PROVIDER_SITE_OTHER): Payer: Self-pay | Admitting: *Deleted

## 2013-07-20 DIAGNOSIS — C50911 Malignant neoplasm of unspecified site of right female breast: Secondary | ICD-10-CM

## 2013-07-20 NOTE — Telephone Encounter (Signed)
Per Dawn - Dr. Clelia Croft gave the ok for one of breast docs to see this pt.  Obtained an appt from Dr. Darnelle Catalan.  Called and confirmed 07/27/13 appt w/ pt.  Unable to schedule the appt for Dr. Darnelle Catalan.  Emailed Dory [place] to open his template so I can schedule the appt.  Mailed before appt letter & intake form to pt.  Emailed Dr. Corliss Skains at [facility] to make him aware. Took paperwork to [facility] for chart.

## 2013-07-25 ENCOUNTER — Telehealth (INDEPENDENT_AMBULATORY_CARE_PROVIDER_SITE_OTHER): Payer: Self-pay | Admitting: General Surgery

## 2013-07-25 ENCOUNTER — Other Ambulatory Visit (INDEPENDENT_AMBULATORY_CARE_PROVIDER_SITE_OTHER): Payer: Self-pay | Admitting: Surgery

## 2013-07-25 NOTE — Telephone Encounter (Signed)
Called over to the cancer center this morning and I spoke to [name] [name] and she stated that the patient is seeing Dr Darnelle Catalan on 07-27-2013 @ 10:00 am and having lab work done on 9:30 am.

## 2013-07-26 ENCOUNTER — Other Ambulatory Visit: Payer: Self-pay | Admitting: Physician Assistant

## 2013-07-26 ENCOUNTER — Encounter: Payer: Self-pay | Admitting: *Deleted

## 2013-07-26 ENCOUNTER — Other Ambulatory Visit: Payer: Self-pay | Admitting: *Deleted

## 2013-07-26 DIAGNOSIS — C50511 Malignant neoplasm of lower-outer quadrant of right female breast: Secondary | ICD-10-CM

## 2013-07-26 NOTE — Progress Notes (Signed)
Gave chart to [facility] to enter labs and give back to me so I can give to MD.

## 2013-07-27 ENCOUNTER — Encounter (HOSPITAL_COMMUNITY): Payer: Self-pay

## 2013-07-27 ENCOUNTER — Telehealth: Payer: Self-pay | Admitting: Oncology

## 2013-07-27 ENCOUNTER — Encounter (HOSPITAL_COMMUNITY): Payer: Self-pay | Admitting: Pharmacy Technician

## 2013-07-27 ENCOUNTER — Other Ambulatory Visit (HOSPITAL_BASED_OUTPATIENT_CLINIC_OR_DEPARTMENT_OTHER): Payer: Medicare Other | Admitting: Lab

## 2013-07-27 ENCOUNTER — Encounter (HOSPITAL_COMMUNITY)
Admission: RE | Admit: 2013-07-27 | Discharge: 2013-07-27 | Disposition: A | Discharge status: Home / Self Care | Payer: Medicare Other | Source: Ambulatory Visit | Attending: Surgery | Admitting: Surgery

## 2013-07-27 ENCOUNTER — Encounter: Payer: Self-pay | Admitting: *Deleted

## 2013-07-27 ENCOUNTER — Encounter (INDEPENDENT_AMBULATORY_CARE_PROVIDER_SITE_OTHER): Payer: Self-pay

## 2013-07-27 ENCOUNTER — Ambulatory Visit (HOSPITAL_BASED_OUTPATIENT_CLINIC_OR_DEPARTMENT_OTHER): Payer: Medicare Other | Admitting: Oncology

## 2013-07-27 ENCOUNTER — Other Ambulatory Visit (HOSPITAL_COMMUNITY): Payer: Self-pay | Admitting: *Deleted

## 2013-07-27 VITALS — BP 125/80 | HR 92 | Temp 98.4°F | Resp 18 | Ht 64.0 in | Wt 180.7 lb

## 2013-07-27 DIAGNOSIS — Z853 Personal history of malignant neoplasm of breast: Secondary | ICD-10-CM

## 2013-07-27 DIAGNOSIS — C50911 Malignant neoplasm of unspecified site of right female breast: Secondary | ICD-10-CM

## 2013-07-27 DIAGNOSIS — C50519 Malignant neoplasm of lower-outer quadrant of unspecified female breast: Secondary | ICD-10-CM

## 2013-07-27 DIAGNOSIS — C50511 Malignant neoplasm of lower-outer quadrant of right female breast: Secondary | ICD-10-CM

## 2013-07-27 DIAGNOSIS — D649 Anemia, unspecified: Secondary | ICD-10-CM

## 2013-07-27 DIAGNOSIS — C9110 Chronic lymphocytic leukemia of B-cell type not having achieved remission: Secondary | ICD-10-CM | POA: Insufficient documentation

## 2013-07-27 DIAGNOSIS — Z170 Estrogen receptor positive status [ER+]: Secondary | ICD-10-CM

## 2013-07-27 HISTORY — DX: Full incontinence of feces: R15.9

## 2013-07-27 HISTORY — DX: Chronic lymphocytic leukemia of B-cell type not having achieved remission: C91.10

## 2013-07-27 LAB — COMPREHENSIVE METABOLIC PANEL (CC13)
AST: 24 U/L (ref 5–34)
Albumin: 3.9 g/dL (ref 3.5–5.0)
Alkaline Phosphatase: 73 U/L (ref 40–150)
BUN: 10.1 mg/dL (ref 7.0–26.0)
CO2: 23 mEq/L (ref 22–29)
Creatinine: 0.8 mg/dL (ref 0.6–1.1)
Potassium: 3.8 mEq/L (ref 3.5–5.1)
Sodium: 146 mEq/L — ABNORMAL HIGH (ref 136–145)
Total Bilirubin: 0.21 mg/dL (ref 0.20–1.20)

## 2013-07-27 LAB — CBC WITH DIFFERENTIAL/PLATELET
Basophils Absolute: 0 10*3/uL (ref 0.0–0.1)
EOS%: 5.6 % (ref 0.0–7.0)
HCT: 32.4 % — ABNORMAL LOW (ref 34.8–46.6)
LYMPH%: 53.2 % — ABNORMAL HIGH (ref 14.0–49.7)
MCH: 27.9 pg (ref 25.1–34.0)
MCV: 86.7 fL (ref 79.5–101.0)
MONO#: 0.6 10*3/uL (ref 0.1–0.9)
MONO%: 5.8 % (ref 0.0–14.0)
NEUT#: 3.6 10*3/uL (ref 1.5–6.5)
NEUT%: 35 % — ABNORMAL LOW (ref 38.4–76.8)
Platelets: 168 10*3/uL (ref 145–400)
RBC: 3.74 10*6/uL (ref 3.70–5.45)
RDW: 14.8 % — ABNORMAL HIGH (ref 11.2–14.5)

## 2013-07-27 MED ORDER — CEFAZOLIN SODIUM-DEXTROSE 2-3 GM-% IV SOLR
2.0000 g | INTRAVENOUS | Status: AC
  Administered 2013-07-28: 2 g via INTRAVENOUS
  Filled 2013-07-27: qty 50

## 2013-07-27 NOTE — Telephone Encounter (Signed)
, °

## 2013-07-27 NOTE — Progress Notes (Signed)
Mailed after appt letter to pt. 

## 2013-07-27 NOTE — Pre-Procedure Instructions (Signed)
[name] [name]  07/27/2013   Your procedure is scheduled on:  Thursday, July 28, 2013 at 11:30 AM.   Report to [facility] Entrance "A" at 9:30 AM.   Call this number if you have problems the morning of surgery: [phone]   Remember:   Do not eat food or drink liquids after midnight tonight, 07/27/13.    Take these medicines the morning of surgery with A SIP OF WATER: levothyroxine (SYNTHROID, LEVOTHROID), metoprolol succinate (TOPROL-XL), omeprazole (PRILOSEC), Oxcarbazepine (TRILEPTAL), Vilazodone HCl (VIIBRYD), oxyCODONE-acetaminophen (PERCOCET/ROXICET) - if needed, ALPRAZolam Prudy Feeler) - if needed.                 Do not wear jewelry, make-up or nail polish.  Do not wear lotions, powders, or perfumes. You may wear deodorant.  Do not shave 48 hours prior to surgery.   Do not bring valuables to the hospital.  [facility] is not responsible                  for any belongings or valuables.               Contacts, dentures or bridgework may not be worn into surgery.  Leave suitcase in the car. After surgery it may be brought to your room.  For patients admitted to the hospital, discharge time is determined by your                treatment team.             Special Instructions: Shower using CHG 2 nights before surgery and the night before surgery.  If you shower the day of surgery use CHG.  Use special wash - you have one bottle of CHG for all showers.  You should use approximately 1/3 of the bottle for each shower.   Please read over the following fact sheets that you were given: Pain Booklet, Coughing and Deep Breathing and Surgical Site Infection Prevention

## 2013-07-27 NOTE — Progress Notes (Signed)
ID: [name] [name] OB: Feb 10, 1946  MR#: [id number]  WUJ#:[id number]  PCP: [name] Edin, MD GYN:  [name] SU: [name] [name],  OTHER MD: [name] [name], [name] [name]  CHIEF COMPLAINT: "Now I have another cancer."  HISTORY OF PRESENT ILLNESS: [name] [name] has a history of breast cancer dating back to 1997, [name] performed a right lumpectomy and axillary lymph node dissection for what according to the patient and her family was a stage I invasive ductal breast cancer. She received radiation adjuvantly and then took tamoxifen for 5 years  In February of 2014 she had a normal screening mammography, but in November 2014 she felt a "hard lump" in her right breast. She brought this to the attention of her urologist, Dr. Harriett [name], and he set her up for bilateral diagnostic mammography with mammography at [name] 07/13/2013. This showed her breast density to be category B. There was no mammographic abnormality noted but ultrasonography showed a 1.4 cm irregularly shaped solid mass in the right breast at the 8:00 position. This was biopsied the same day, and showed ([id number]) an invasive ductal carcinoma, grade 2, estrogen receptor 100% positive, progesterone receptor 13% positive, with an MIB-1 of 33% and HER-2 amplification with a HER-2: CEP 17 ratio of 3.19, and a HER-2 copy number percent all of 4.15.  The patient's subsequent history is as detailed below  INTERVAL HISTORY: [name] was evaluated in the breast clinic 07/27/2013 accompanied by her husband [name] [name] and her daughter [name] [name]  REVIEW OF SYSTEMS: The patient has a diffusely positive review of systems, but most of the symptoms are chronic including insomnia, fatigue which significantly affects her activity level, pain in her back, muscle aches, hearing loss, reading and her ears, sore throat, palpitations and irregular heartbeats, productive cough (she had an episode of pneumonia less than 2 months ago), heartburn,  variable stool habits, abdominal pain, blood in her urine, kidney stones, difficulty walking, headaches, fainting, forgetfulness, anxiety, depression, phobia as, and severe hot flashes. A detailed review of systems today was otherwise noncontributory  PAST MEDICAL HISTORY: Past Medical History  Diagnosis Date  . Sinus tachycardia     mild resting  . HLD (hyperlipidemia)   . Orthostatic hypotension     slight in the past  . Anxiety   . Depression   . Diverticulosis   . Fibromyalgia 1978  . Hypothyroidism   . Alopecia   . Ruptured disk     one in neck and two in back  . Plantar fasciitis   . Arthritis   . DDD (degenerative disc disease)   . History of breast cancer     1997   s/p  right breast lumpectomy w/ node dissection  . GERD (gastroesophageal reflux disease)   . CAP (community acquired pneumonia)     admission 06-04-2013 secondary to failed oral antibitotics---  last cxr 06-16-2013  much improved  . History of syncope     episode 2008--  no recurrence since  . Ureteral calculi     bilateral  . Leukocytosis   . Chronic back pain   . 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 ear   . SOB (shortness of breath) 06-17-2013 CURRENTLY RESIDUAL FROM RECENT CAP    SOB from obesity and deconditioning 2009- eval included CPX  . Urgency of urination   . Hematuria   . History of kidney stones   . Sensation of pressure in bladder area   . Wears glasses   . Ecchymosis  FROM IV AND LAB WORK THE PAST WEEK  06-17-2013  . Stool incontinence     "at times recently"   . CLL (chronic lymphocytic leukemia)   . Breast cancer     right    PAST SURGICAL HISTORY: Past Surgical History  Procedure Laterality Date  . Lumbar epidural injection      has had 7 injections  . Total abdominal hysterectomy w/ bilateral salpingoophorectomy  1997  . Breast lumpectomy with axillary lymph node dissection Right 1997  . Transthoracic echocardiogram  05-11-2007  dr [name] [name]    normal lvf/  ef 65%  . Tonsillectomy  AGE 33   . Retinal detachment surgery Right 2013  . Cystoscopy with retrograde pyelogram, ureteroscopy and stent placement Bilateral 06/17/2013    Procedure: CYSTOSCOPY WITH RETROGRADE PYELOGRAM, URETEROSCOPY AND LEFT DOUBLE  J STENT PLACEMENT RIGHT URETERAL HOLMIIUM LASER AND DOUBLE J STENT ;  Surgeon: [name] Slough, MD;  Location: [name];  Service: Urology;  Laterality: Bilateral;  . Holmium laser application Bilateral 06/17/2013    Procedure: HOLMIUM LASER APPLICATION;  Surgeon: [name] Slough, MD;  Location: [name];  Service: Urology;  Laterality: Bilateral;  . Breast surgery      FAMILY HISTORY Family History  Problem Relation Age of Onset  . Heart disease Maternal Grandfather   . Diabetes Maternal Grandfather   . Colon cancer Maternal Aunt   . Other Paternal Grandmother     brain tumor  . Dementia Mother   . Diabetes Mother   . Osteoporosis Mother   . Diabetes Maternal Aunt   . Prostate cancer Father   . Brain cancer Maternal Grandmother   . Bipolar disorder Maternal Aunt   . Stroke Maternal Aunt    the patient's mother died at the age of 30. The patient's father is alive at age 87. She had no brothers or sisters. Her father has a history of prostate cancer. There is no history of breast or ovarian cancer in the family. One maternal first cousin has a history of Hodgkin's lymphoma.  GYNECOLOGIC HISTORY:  Menarche age 74, first live birth age 38. She is GX P1. She underwent total abdominal hysterectomy and bilateral salpingo-oophorectomy in 1997 she did not take hormone replacement.  SOCIAL HISTORY:  She is a Futures trader. Her husband [name] [name]. Daughter [name] [name] lives in [name] where she works as a [name] for a drug company. The patient has no grandchildren. She has to "grab cats". She attends a local [employer]    ADVANCED DIRECTIVES: Not in place   HEALTH  MAINTENANCE: History  Substance Use Topics  . Smoking status: Never Smoker   . Smokeless tobacco: Never Used  . Alcohol Use: No     Colonoscopy: 2009/ [facility]  PAP: Status post hysterectomy  Bone density: May 10/14/2009 at [facility] was normal  Lipid panel:  Allergies  Allergen Reactions  . Amoxicillin Itching  . Lasix [Furosemide] Nausea And Vomiting and Other (See Comments)    headache  . Lithium Other (See Comments)    Dizziness, "cause me to fall"  . Sulfa Antibiotics Hives  . Trazodone And Nefazodone Other (See Comments)    insomnia  . Lyrica [Pregabalin] Diarrhea, Nausea Only and Rash    Current Outpatient Prescriptions  Medication Sig Dispense Refill  . amitriptyline (ELAVIL) 150 MG tablet Take 150 mg by mouth at bedtime.      . Ascorbic Acid (VITAMIN C) 1000 MG tablet Take 1,000  mg by mouth daily.      . cetirizine (ZYRTEC) 10 MG tablet Take 10 mg by mouth daily.      . Cholecalciferol (VITAMIN D-3) 1000 UNITS CAPS Take 5,000 Units by mouth daily.      . clindamycin (CLEOCIN) 300 MG capsule Take 300 mg by mouth 3 (three) times daily.      Marland Kitchen gabapentin (NEURONTIN) 300 MG capsule Take 300 mg by mouth daily as needed (sleep). 300mg  with evening meal, 300mg  at bedtime      . metoprolol succinate (TOPROL-XL) 25 MG 24 hr tablet Take 25 mg by mouth daily.      . minoxidil (ROGAINE) 2 % external solution Apply 1 application topically 2 (two) times daily as needed.       . Omega-3 Fatty Acids (FISH OIL PO) Take 1 capsule by mouth daily.       Marland Kitchen omeprazole (PRILOSEC) 20 MG capsule Take 20 mg by mouth every morning.       . Oxcarbazepine (TRILEPTAL) 300 MG tablet Take 300 mg by mouth 2 (two) times daily.      . Vilazodone HCl (VIIBRYD) 20 MG TABS Take 1 tablet by mouth daily.      . vitamin B-12 (CYANOCOBALAMIN) 1000 MCG tablet Take 1,000 mcg by mouth daily.      Marland Kitchen ALPRAZolam (XANAX) 0.5 MG tablet Take 0.5 mg by mouth every 8 (eight) hours as needed for anxiety.      Marland Kitchen buPROPion  (WELLBUTRIN SR) 150 MG 12 hr tablet Take 150-300 mg by mouth 2 (two) times daily. Take 300mg  in the morning and 150mg  at 2pm      . diphenhydrAMINE (BENADRYL) 25 MG tablet Take 25 mg by mouth every 6 (six) hours as needed for itching.      Marland Kitchen ibuprofen (ADVIL,MOTRIN) 200 MG tablet Take 800 mg by mouth every 6 (six) hours as needed for moderate pain.      Marland Kitchen levothyroxine (SYNTHROID, LEVOTHROID) 50 MCG tablet Take 50 mcg by mouth daily before breakfast.      . Multiple Vitamins-Minerals (HAIR VITAMINS PO) Take 1 tablet by mouth 2 (two) times daily.      Marland Kitchen oxyCODONE-acetaminophen (PERCOCET) 10-325 MG per tablet Take 1 tablet by mouth 4 (four) times daily as needed for pain.       No current facility-administered medications for this visit.   Facility-Administered Medications Ordered in Other Visits  Medication Dose Route Frequency Provider Last Rate Last Dose  . [START ON 07/28/2013] ceFAZolin (ANCEF) IVPB 2 g/50 mL premix  2 g Intravenous On Call to OR Wilmon Arms. Tsuei, MD        OBJECTIVE: 67 year old white woman who appears stated age 20 Vitals:   07/27/13 0944  BP: 125/80  Pulse: 92  Temp: 98.4 F (36.9 C)  Resp: 18     Body mass index is 31 kg/(m^2).    ECOG FS:1 - Symptomatic but completely ambulatory  Ocular: Sclerae unicteric, pupils equal and round Ear-nose-throat: Oropharynx shows no thrush or other lesions Lymphatic: No cervical or supraclavicular adenopathy; no inguinal adenopathy Lungs no rales or rhonchi, good excursion bilaterally Heart regular rate and rhythm, no murmur appreciated Abd soft, nontender, positive bowel sounds, no splenomegaly, no masses palpated MSK no focal spinal tenderness, no joint edema Neuro: non-focal, well-oriented, appropriate affect Breasts: The right breast is status post remote lumpectomy and recent biopsy. I do not palpate a mass in the patient originally palpated. She was not able to demonstrate it  to me today. There are no skin or nipple  changes of concern. I do not palpate any adenopathy in the right axilla. The left breast was unremarkable.   LAB RESULTS:  CMP     Component Value Date/Time   NA 146* 07/27/2013 0929   NA 144 06/10/2013 1140   K 3.8 07/27/2013 0929   K 3.5 06/10/2013 1140   CL 105 06/10/2013 1140   CO2 23 07/27/2013 0929   CO2 24 06/10/2013 1140   GLUCOSE 100 07/27/2013 0929   GLUCOSE 103* 06/10/2013 1140   BUN 10.1 07/27/2013 0929   BUN 8 06/10/2013 1140   CREATININE 0.8 07/27/2013 0929   CREATININE 0.84 06/10/2013 1140   CALCIUM 9.3 07/27/2013 0929   CALCIUM 9.5 06/10/2013 1140   PROT 6.3* 07/27/2013 0929   PROT 6.7 06/04/2013 2221   ALBUMIN 3.9 07/27/2013 0929   ALBUMIN 2.6* 06/04/2013 2221   AST 24 07/27/2013 0929   AST 30 06/04/2013 2221   ALT 25 07/27/2013 0929   ALT 45* 06/04/2013 2221   ALKPHOS 73 07/27/2013 0929   ALKPHOS 196* 06/04/2013 2221   BILITOT 0.21 07/27/2013 0929   BILITOT 0.3 06/04/2013 2221   GFRNONAA 70* 06/10/2013 1140   GFRAA 82* 06/10/2013 1140    I No results found for this basename: SPEP, UPEP,  kappa and lambda light chains    Lab Results  Component Value Date   WBC 10.4* 07/27/2013   NEUTROABS 3.6 07/27/2013   HGB 10.4* 07/27/2013   HCT 32.4* 07/27/2013   MCV 86.7 07/27/2013   PLT 168 07/27/2013      Chemistry      Component Value Date/Time   NA 146* 07/27/2013 0929   NA 144 06/10/2013 1140   K 3.8 07/27/2013 0929   K 3.5 06/10/2013 1140   CL 105 06/10/2013 1140   CO2 23 07/27/2013 0929   CO2 24 06/10/2013 1140   BUN 10.1 07/27/2013 0929   BUN 8 06/10/2013 1140   CREATININE 0.8 07/27/2013 0929   CREATININE 0.84 06/10/2013 1140      Component Value Date/Time   CALCIUM 9.3 07/27/2013 0929   CALCIUM 9.5 06/10/2013 1140   ALKPHOS 73 07/27/2013 0929   ALKPHOS 196* 06/04/2013 2221   AST 24 07/27/2013 0929   AST 30 06/04/2013 2221   ALT 25 07/27/2013 0929   ALT 45* 06/04/2013 2221   BILITOT 0.21 07/27/2013 0929   BILITOT 0.3 06/04/2013 2221       No results  found for this basename: LABCA2    No components found with this basename: LABCA125    No results found for this basename: INR,  in the last 168 hours  Urinalysis    Component Value Date/Time   COLORURINE YELLOW 06/04/2013 2129   APPEARANCEUR CLOUDY* 06/04/2013 2129   LABSPEC 1.020 06/04/2013 2129   PHURINE 5.5 06/04/2013 2129   GLUCOSEU NEGATIVE 06/04/2013 2129   HGBUR LARGE* 06/04/2013 2129   BILIRUBINUR mod 06/16/2013 1226   BILIRUBINUR MODERATE* 06/04/2013 2129   KETONESUR 15* 06/04/2013 2129   PROTEINUR 30* 06/04/2013 2129   UROBILINOGEN 4.0 06/16/2013 1226   UROBILINOGEN 0.2 06/04/2013 2129   NITRITE positive 06/16/2013 1226   NITRITE NEGATIVE 06/04/2013 2129   LEUKOCYTESUR large (3+) 06/16/2013 1226    STUDIES: Dg Chest 2 View  07/27/2013   CLINICAL DATA:  Right total mastectomy.  Shortness of breath.  EXAM: CHEST  2 VIEW  COMPARISON:  06/16/2013.  FINDINGS: There is no focal parenchymal opacity, pleural  effusion, or pneumothorax. The heart and mediastinal contours are unremarkable. There are surgical which clips in the right axilla from an axillary dissection.  The osseous structures are unremarkable.  IMPRESSION: No active cardiopulmonary disease.   Electronically Signed   By: Elige Ko   On: 07/27/2013 14:48   Dg Abd 1 View  07/04/2013   CLINICAL DATA:  Pre-lithotripsy radiograph; left-sided ureteral stone.  EXAM: ABDOMEN - 1 VIEW  COMPARISON:  None.  FINDINGS: The known ureteral stone is not well characterized, and may be in unchanged position along the proximal left ureter. The left ureteral stent is grossly unremarkable in appearance. A 4 mm stone is noted at lower pole of the left kidney. The visualized bowel gas pattern is grossly unremarkable. No acute osseous abnormalities are seen.  IMPRESSION: 1. Known left ureteral stone not well characterized; it may be in unchanged position along the proximal left ureter. 2. Left ureteral stent unremarkable in appearance. 3.  4 mm stone noted at the lower pole of the left kidney.   Electronically Signed   By: Roanna Raider DanielleD.   On: 07/04/2013 06:23    ASSESSMENT: 67 y.o. [place] woman  (1) status post right lumpectomy and axillary lymph node dissection in 1997 for a stage I breast cancer, treated with adjuvant radiation and tamoxifen for 5 years  (2) status post right breast biopsy 07/13/2013 for a clinical T1c N0, stage IA invasive ductal carcinoma, grade 2, estrogen receptor 100% positive, progesterone receptor 13% positive, with an MIB-1 of 33%, and HER-2 amplification by CISH with a HER2/CEP 17 ratio of 3.19, and an average HER-2 copy number per cell of 4.15  (3) the patient has a history of chronic lymphoid leukemia diagnosed by flow cytometry 06/29/2013, the cells being CD5, CD20 and CD23 positive, CD10 negative. This requires only followup.  (4) anemia with a normal MCV. Will obtain additional workup at the next visit  PLAN: We spent the better part of today's hour-long appointment discussing the biology of breast cancer in general, and the specifics of the patient's tumor in particular. Tsuyako understands her prognosis is particularly good because we can use all 3 systemic treatment modalities to fight her cancer: antiestrogen pills, anti HER-2 immunotherapy, and chemotherapy. They wanted to know why chemotherapy was recommended, and we discussed the fact that we have no data for anti-HER-2 treatment without chemotherapy in the adjuvant setting. The data in stage IV cases is not relevant, since the intent there is not cure but control. I referred the patient and particularly her daughter to NCCN guidelines as well as the NCI website data on breast cancer for further information  We discussed briefly the possible toxicities, side effects and complications of chemotherapy, but the patient will benefit from participating in our "chemotherapy school" for further discussion. She understands she will need a port  and I have confirmed with Dr.Tsuei that this will be placed at the time of her lumpectomy. I have also set her up for an echocardiogram. We will need to schedule her with radiation oncology at some point and she understands she will need radiation once the chemotherapy portion of her systemic treatment has been completed. After the radiation she will start antiestrogens.  A standard protocol in cases like this would be carboplatin and docetaxel every 3 weeks x6, given together with trastuzumab. However we do have data for Taxol alone plus trastuzumab in favorable cases and we could consider that option when the patient returns to see me after  her definitive surgery to operationalize the chemotherapy and anti-HER-2 treatment.  Raylie has a good understanding of the overall plan and is in agreement with it. She understands the goal of treatment is cure. She will call for any problems that may develop before next visit here, which will be early January  Incidentally, the patient had previously been evaluated by my partner Dr. Clelia Croft for her chronic lymphoid leukemia. I have discussed the case with him, and his feeling is it would be easier, since the breast cancer is what requires attention at this point, that we also follow the patient's chronic lymphoid leukemia instead of her having to see a second physician for that purpose. Accordingly we are canceling her appointment with Dr. Clelia Croft scheduled for January 6.   Lowella Dell, MD   07/27/2013 6:33 PM

## 2013-07-27 NOTE — Addendum Note (Signed)
Addended by: Lowella Dell on: 07/27/2013 07:14 PM   Modules accepted: Orders

## 2013-07-28 ENCOUNTER — Encounter (HOSPITAL_COMMUNITY)
Admission: RE | Disposition: A | Discharge status: Home / Self Care | Payer: Self-pay | Source: Ambulatory Visit | Attending: Surgery

## 2013-07-28 ENCOUNTER — Ambulatory Visit (HOSPITAL_COMMUNITY): Payer: Medicare Other | Admitting: Anesthesiology

## 2013-07-28 ENCOUNTER — Encounter (HOSPITAL_COMMUNITY): Payer: Self-pay | Admitting: *Deleted

## 2013-07-28 ENCOUNTER — Inpatient Hospital Stay (HOSPITAL_COMMUNITY): Payer: Medicare Other

## 2013-07-28 ENCOUNTER — Encounter (HOSPITAL_COMMUNITY): Payer: Medicare Other | Admitting: Anesthesiology

## 2013-07-28 ENCOUNTER — Inpatient Hospital Stay (HOSPITAL_COMMUNITY)
Admission: RE | Admit: 2013-07-28 | Discharge: 2013-07-30 | DRG: 582 | Disposition: A | Discharge status: Home / Self Care | Payer: Medicare Other | Source: Ambulatory Visit | Attending: Surgery | Admitting: Surgery

## 2013-07-28 ENCOUNTER — Telehealth: Payer: Self-pay | Admitting: Oncology

## 2013-07-28 ENCOUNTER — Ambulatory Visit (HOSPITAL_COMMUNITY): Payer: Medicare Other

## 2013-07-28 DIAGNOSIS — C50911 Malignant neoplasm of unspecified site of right female breast: Secondary | ICD-10-CM

## 2013-07-28 DIAGNOSIS — Z79899 Other long term (current) drug therapy: Secondary | ICD-10-CM

## 2013-07-28 DIAGNOSIS — F411 Generalized anxiety disorder: Secondary | ICD-10-CM | POA: Diagnosis present

## 2013-07-28 DIAGNOSIS — E785 Hyperlipidemia, unspecified: Secondary | ICD-10-CM | POA: Diagnosis present

## 2013-07-28 DIAGNOSIS — E039 Hypothyroidism, unspecified: Secondary | ICD-10-CM | POA: Diagnosis present

## 2013-07-28 DIAGNOSIS — F3289 Other specified depressive episodes: Secondary | ICD-10-CM | POA: Diagnosis present

## 2013-07-28 DIAGNOSIS — F329 Major depressive disorder, single episode, unspecified: Secondary | ICD-10-CM | POA: Diagnosis present

## 2013-07-28 DIAGNOSIS — H9190 Unspecified hearing loss, unspecified ear: Secondary | ICD-10-CM | POA: Diagnosis present

## 2013-07-28 DIAGNOSIS — C50919 Malignant neoplasm of unspecified site of unspecified female breast: Secondary | ICD-10-CM | POA: Diagnosis present

## 2013-07-28 DIAGNOSIS — K219 Gastro-esophageal reflux disease without esophagitis: Secondary | ICD-10-CM | POA: Diagnosis present

## 2013-07-28 DIAGNOSIS — Z923 Personal history of irradiation: Secondary | ICD-10-CM

## 2013-07-28 DIAGNOSIS — C9110 Chronic lymphocytic leukemia of B-cell type not having achieved remission: Secondary | ICD-10-CM | POA: Diagnosis present

## 2013-07-28 HISTORY — PX: SIMPLE MASTECTOMY WITH AXILLARY SENTINEL NODE BIOPSY: SHX6098

## 2013-07-28 HISTORY — DX: Calculus of kidney: N20.0

## 2013-07-28 HISTORY — PX: PORTACATH PLACEMENT: SHX2246

## 2013-07-28 LAB — CBC
MCH: 27.9 pg (ref 26.0–34.0)
MCHC: 31.1 g/dL (ref 30.0–36.0)
MCV: 89.9 fL (ref 78.0–100.0)
Platelets: 150 10*3/uL (ref 150–400)
RDW: 14.5 % (ref 11.5–15.5)

## 2013-07-28 LAB — CREATININE, SERUM: Creatinine, Ser: 0.67 mg/dL (ref 0.50–1.10)

## 2013-07-28 SURGERY — SIMPLE MASTECTOMY
Anesthesia: General | Site: Chest | Laterality: Right

## 2013-07-28 MED ORDER — ENOXAPARIN SODIUM 40 MG/0.4ML ~~LOC~~ SOLN
40.0000 mg | SUBCUTANEOUS | Status: DC
  Administered 2013-07-29: 40 mg via SUBCUTANEOUS
  Filled 2013-07-28 (×2): qty 0.4

## 2013-07-28 MED ORDER — AMITRIPTYLINE HCL 75 MG PO TABS
150.0000 mg | ORAL_TABLET | Freq: Every day | ORAL | Status: DC
  Administered 2013-07-28 – 2013-07-29 (×2): 150 mg via ORAL
  Filled 2013-07-28 (×3): qty 2

## 2013-07-28 MED ORDER — BUPROPION HCL ER (SR) 150 MG PO TB12: 150.0000 mg | ORAL_TABLET | Freq: Two times a day (BID) | ORAL | Status: DC

## 2013-07-28 MED ORDER — OXCARBAZEPINE 300 MG PO TABS
300.0000 mg | ORAL_TABLET | Freq: Two times a day (BID) | ORAL | Status: DC
  Administered 2013-07-28 – 2013-07-30 (×4): 300 mg via ORAL
  Filled 2013-07-28 (×5): qty 1

## 2013-07-28 MED ORDER — BUPROPION HCL ER (SR) 150 MG PO TB12
150.0000 mg | ORAL_TABLET | Freq: Every day | ORAL | Status: DC
  Administered 2013-07-28 – 2013-07-29 (×2): 150 mg via ORAL
  Filled 2013-07-28 (×4): qty 1

## 2013-07-28 MED ORDER — VILAZODONE HCL 20 MG PO TABS
1.0000 | ORAL_TABLET | Freq: Every day | ORAL | Status: DC
  Administered 2013-07-29 – 2013-07-30 (×2): 20 mg via ORAL
  Filled 2013-07-28 (×3): qty 1

## 2013-07-28 MED ORDER — ONDANSETRON HCL 4 MG/2ML IJ SOLN: 4.0000 mg | Freq: Four times a day (QID) | INTRAMUSCULAR | Status: DC | PRN

## 2013-07-28 MED ORDER — CHLORHEXIDINE GLUCONATE 4 % EX LIQD: 1.0000 "application " | Freq: Once | CUTANEOUS | Status: DC

## 2013-07-28 MED ORDER — CEFAZOLIN SODIUM 1-5 GM-% IV SOLN
1.0000 g | Freq: Four times a day (QID) | INTRAVENOUS | Status: AC
  Administered 2013-07-28 – 2013-07-29 (×3): 1 g via INTRAVENOUS
  Filled 2013-07-28 (×3): qty 50

## 2013-07-28 MED ORDER — SODIUM CHLORIDE 0.9 % IR SOLN
Status: DC | PRN
  Administered 2013-07-28: 12:00:00

## 2013-07-28 MED ORDER — LACTATED RINGERS IV SOLN
INTRAVENOUS | Status: DC | PRN
  Administered 2013-07-28 (×2): via INTRAVENOUS

## 2013-07-28 MED ORDER — MIDAZOLAM HCL 5 MG/5ML IJ SOLN
INTRAMUSCULAR | Status: DC | PRN
  Administered 2013-07-28: 1 mg via INTRAVENOUS

## 2013-07-28 MED ORDER — LEVOTHYROXINE SODIUM 50 MCG PO TABS
50.0000 ug | ORAL_TABLET | Freq: Every day | ORAL | Status: DC
  Administered 2013-07-29 – 2013-07-30 (×2): 50 ug via ORAL
  Filled 2013-07-28 (×3): qty 1

## 2013-07-28 MED ORDER — LACTATED RINGERS IV SOLN
INTRAVENOUS | Status: DC
  Administered 2013-07-28: 10:00:00 via INTRAVENOUS

## 2013-07-28 MED ORDER — HYDROMORPHONE HCL PF 1 MG/ML IJ SOLN
0.2500 mg | INTRAMUSCULAR | Status: DC | PRN
  Administered 2013-07-28 (×2): 0.5 mg via INTRAVENOUS

## 2013-07-28 MED ORDER — ALPRAZOLAM 0.5 MG PO TABS
0.5000 mg | ORAL_TABLET | Freq: Three times a day (TID) | ORAL | Status: DC | PRN
  Administered 2013-07-28: 0.5 mg via ORAL
  Filled 2013-07-28: qty 1

## 2013-07-28 MED ORDER — BUPIVACAINE-EPINEPHRINE 0.25% -1:200000 IJ SOLN
INTRAMUSCULAR | Status: DC | PRN
  Administered 2013-07-28: 10 mL

## 2013-07-28 MED ORDER — GABAPENTIN 300 MG PO CAPS
300.0000 mg | ORAL_CAPSULE | Freq: Every day | ORAL | Status: DC | PRN
  Filled 2013-07-28: qty 1

## 2013-07-28 MED ORDER — ONDANSETRON HCL 4 MG/2ML IJ SOLN
INTRAMUSCULAR | Status: DC | PRN
  Administered 2013-07-28: 4 mg via INTRAVENOUS

## 2013-07-28 MED ORDER — OXYCODONE-ACETAMINOPHEN 5-325 MG PO TABS
1.0000 | ORAL_TABLET | ORAL | Status: DC | PRN
  Administered 2013-07-28 – 2013-07-29 (×2): 2 via ORAL
  Administered 2013-07-30: 1 via ORAL
  Administered 2013-07-30: 2 via ORAL
  Filled 2013-07-28: qty 2
  Filled 2013-07-28: qty 1
  Filled 2013-07-28 (×2): qty 2
  Filled 2013-07-28: qty 1

## 2013-07-28 MED ORDER — HEPARIN SOD (PORK) LOCK FLUSH 100 UNIT/ML IV SOLN
INTRAVENOUS | Status: DC | PRN
  Administered 2013-07-28: 500 [IU] via INTRAVENOUS

## 2013-07-28 MED ORDER — BUPIVACAINE-EPINEPHRINE (PF) 0.25% -1:200000 IJ SOLN
INTRAMUSCULAR | Status: AC
  Filled 2013-07-28: qty 30

## 2013-07-28 MED ORDER — PROPOFOL 10 MG/ML IV BOLUS
INTRAVENOUS | Status: DC | PRN
  Administered 2013-07-28: 170 mg via INTRAVENOUS

## 2013-07-28 MED ORDER — HYDROMORPHONE HCL PF 1 MG/ML IJ SOLN
INTRAMUSCULAR | Status: AC
  Filled 2013-07-28: qty 1

## 2013-07-28 MED ORDER — HYDROMORPHONE HCL PF 1 MG/ML IJ SOLN
INTRAMUSCULAR | Status: AC
  Administered 2013-07-28: 0.5 mg via INTRAVENOUS
  Filled 2013-07-28: qty 1

## 2013-07-28 MED ORDER — HEPARIN SOD (PORK) LOCK FLUSH 100 UNIT/ML IV SOLN
INTRAVENOUS | Status: AC
  Filled 2013-07-28: qty 5

## 2013-07-28 MED ORDER — 0.9 % SODIUM CHLORIDE (POUR BTL) OPTIME
TOPICAL | Status: DC | PRN
  Administered 2013-07-28: 1000 mL

## 2013-07-28 MED ORDER — OXYCODONE HCL 5 MG PO TABS: 5.0000 mg | ORAL_TABLET | Freq: Once | ORAL | Status: DC | PRN

## 2013-07-28 MED ORDER — POTASSIUM CHLORIDE IN NACL 20-0.9 MEQ/L-% IV SOLN
INTRAVENOUS | Status: DC
  Administered 2013-07-28 – 2013-07-29 (×2): via INTRAVENOUS
  Filled 2013-07-28 (×3): qty 1000

## 2013-07-28 MED ORDER — LIDOCAINE HCL (CARDIAC) 20 MG/ML IV SOLN
INTRAVENOUS | Status: DC | PRN
  Administered 2013-07-28: 80 mg via INTRAVENOUS

## 2013-07-28 MED ORDER — FENTANYL CITRATE 0.05 MG/ML IJ SOLN
INTRAMUSCULAR | Status: DC | PRN
  Administered 2013-07-28: 25 ug via INTRAVENOUS
  Administered 2013-07-28: 50 ug via INTRAVENOUS
  Administered 2013-07-28: 25 ug via INTRAVENOUS
  Administered 2013-07-28: 50 ug via INTRAVENOUS
  Administered 2013-07-28: 25 ug via INTRAVENOUS
  Administered 2013-07-28: 50 ug via INTRAVENOUS

## 2013-07-28 MED ORDER — METOPROLOL SUCCINATE ER 25 MG PO TB24
25.0000 mg | ORAL_TABLET | Freq: Every day | ORAL | Status: DC
  Administered 2013-07-29 – 2013-07-30 (×2): 25 mg via ORAL
  Filled 2013-07-28 (×2): qty 1

## 2013-07-28 MED ORDER — ONDANSETRON HCL 4 MG PO TABS: 4.0000 mg | ORAL_TABLET | Freq: Four times a day (QID) | ORAL | Status: DC | PRN

## 2013-07-28 MED ORDER — OXYCODONE HCL 5 MG/5ML PO SOLN: 5.0000 mg | Freq: Once | ORAL | Status: DC | PRN

## 2013-07-28 MED ORDER — BUPROPION HCL ER (SR) 150 MG PO TB12
300.0000 mg | ORAL_TABLET | Freq: Every day | ORAL | Status: DC
  Administered 2013-07-29 – 2013-07-30 (×2): 300 mg via ORAL
  Filled 2013-07-28 (×2): qty 2

## 2013-07-28 MED ORDER — HYDROMORPHONE HCL PF 1 MG/ML IJ SOLN
1.0000 mg | INTRAMUSCULAR | Status: DC | PRN
  Administered 2013-07-28 – 2013-07-29 (×3): 1 mg via INTRAVENOUS
  Filled 2013-07-28 (×3): qty 1

## 2013-07-28 SURGICAL SUPPLY — 75 items
APL SKNCLS STERI-STRIP NONHPOA (GAUZE/BANDAGES/DRESSINGS) ×2
APPLIER CLIP 9.375 MED OPEN (MISCELLANEOUS)
APR CLP MED 9.3 20 MLT OPN (MISCELLANEOUS)
BAG DECANTER FOR FLEXI CONT (MISCELLANEOUS) ×3 IMPLANT
BANDAGE ADHESIVE 1X3 (GAUZE/BANDAGES/DRESSINGS) ×2 IMPLANT
BENZOIN TINCTURE PRP APPL 2/3 (GAUZE/BANDAGES/DRESSINGS) ×3 IMPLANT
BINDER BREAST LRG (GAUZE/BANDAGES/DRESSINGS) ×1 IMPLANT
BINDER BREAST XLRG (GAUZE/BANDAGES/DRESSINGS) IMPLANT
BLADE SURG 11 STRL SS (BLADE) ×3 IMPLANT
BLADE SURG 15 STRL LF DISP TIS (BLADE) ×2 IMPLANT
BLADE SURG 15 STRL SS (BLADE) ×3
CANISTER SUCTION 2500CC (MISCELLANEOUS) ×3 IMPLANT
CHLORAPREP W/TINT 10.5 ML (MISCELLANEOUS) ×3 IMPLANT
CHLORAPREP W/TINT 26ML (MISCELLANEOUS) ×3 IMPLANT
CLIP APPLIE 9.375 MED OPEN (MISCELLANEOUS) IMPLANT
COVER SURGICAL LIGHT HANDLE (MISCELLANEOUS) ×3 IMPLANT
CRADLE DONUT ADULT HEAD (MISCELLANEOUS) ×3 IMPLANT
DECANTER SPIKE VIAL GLASS SM (MISCELLANEOUS) ×3 IMPLANT
DRAIN CHANNEL 19F RND (DRAIN) ×3 IMPLANT
DRAPE C-ARM 42X72 X-RAY (DRAPES) ×3 IMPLANT
DRAPE LAPAROSCOPIC ABDOMINAL (DRAPES) ×3 IMPLANT
DRAPE UTILITY 15X26 W/TAPE STR (DRAPE) ×6 IMPLANT
DRSG TEGADERM 4X4.75 (GAUZE/BANDAGES/DRESSINGS) ×3 IMPLANT
ELECT BLADE 4.0 EZ CLEAN MEGAD (MISCELLANEOUS) ×3
ELECT CAUTERY BLADE 6.4 (BLADE) ×3 IMPLANT
ELECT REM PT RETURN 9FT ADLT (ELECTROSURGICAL) ×3
ELECTRODE BLDE 4.0 EZ CLN MEGD (MISCELLANEOUS) IMPLANT
ELECTRODE REM PT RTRN 9FT ADLT (ELECTROSURGICAL) ×2 IMPLANT
EVACUATOR SILICONE 100CC (DRAIN) ×3 IMPLANT
GAUZE SPONGE 2X2 8PLY STRL LF (GAUZE/BANDAGES/DRESSINGS) ×2 IMPLANT
GAUZE SPONGE 4X4 16PLY XRAY LF (GAUZE/BANDAGES/DRESSINGS) ×3 IMPLANT
GAUZE XEROFORM 1X8 LF (GAUZE/BANDAGES/DRESSINGS) ×1 IMPLANT
GLOVE BIO SURGEON STRL SZ7 (GLOVE) ×3 IMPLANT
GLOVE BIOGEL PI IND STRL 7.5 (GLOVE) ×2 IMPLANT
GLOVE BIOGEL PI INDICATOR 7.5 (GLOVE) ×1
GOWN STRL NON-REIN LRG LVL3 (GOWN DISPOSABLE) ×6 IMPLANT
INTRODUCER COOK 11FR (CATHETERS) IMPLANT
KIT BASIN OR (CUSTOM PROCEDURE TRAY) ×3 IMPLANT
KIT PORT POWER 8FR ISP CVUE (Catheter) ×1 IMPLANT
KIT PORT POWER 9.6FR MRI PREA (Catheter) IMPLANT
KIT PORT POWER ISP 8FR (Catheter) IMPLANT
KIT POWER CATH 8FR (Catheter) IMPLANT
KIT ROOM TURNOVER OR (KITS) ×3 IMPLANT
NDL HYPO 25GX1X1/2 BEV (NEEDLE) ×2 IMPLANT
NDL SPNL 18GX3.5 QUINCKE PK (NEEDLE) IMPLANT
NEEDLE HYPO 25GX1X1/2 BEV (NEEDLE) ×3 IMPLANT
NEEDLE SPNL 18GX3.5 QUINCKE PK (NEEDLE) ×3 IMPLANT
NS IRRIG 1000ML POUR BTL (IV SOLUTION) ×3 IMPLANT
PACK GENERAL/GYN (CUSTOM PROCEDURE TRAY) ×3 IMPLANT
PACK SURGICAL SETUP 50X90 (CUSTOM PROCEDURE TRAY) ×3 IMPLANT
PAD ARMBOARD 7.5X6 YLW CONV (MISCELLANEOUS) ×3 IMPLANT
PENCIL BUTTON HOLSTER BLD 10FT (ELECTRODE) ×4 IMPLANT
SET INTRODUCER 12FR PACEMAKER (SHEATH) IMPLANT
SET SHEATH INTRODUCER 10FR (MISCELLANEOUS) ×1 IMPLANT
SHEATH COOK PEEL AWAY SET 9F (SHEATH) IMPLANT
SPECIMEN JAR LARGE (MISCELLANEOUS) ×3 IMPLANT
SPONGE GAUZE 2X2 STER 10/PKG (GAUZE/BANDAGES/DRESSINGS) ×1
SPONGE GAUZE 4X4 12PLY (GAUZE/BANDAGES/DRESSINGS) ×3 IMPLANT
SPONGE LAP 18X18 X RAY DECT (DISPOSABLE) IMPLANT
STAPLER VISISTAT 35W (STAPLE) ×1 IMPLANT
STRIP CLOSURE SKIN 1/2X4 (GAUZE/BANDAGES/DRESSINGS) ×3 IMPLANT
SUT ETHILON 3 0 FSL (SUTURE) ×3 IMPLANT
SUT MNCRL AB 4-0 PS2 18 (SUTURE) ×3 IMPLANT
SUT PROLENE 2 0 SH DA (SUTURE) ×3 IMPLANT
SUT VIC AB 3-0 SH 18 (SUTURE) ×3 IMPLANT
SUT VIC AB 3-0 SH 27 (SUTURE) ×3
SUT VIC AB 3-0 SH 27X BRD (SUTURE) ×2 IMPLANT
SYR 20ML ECCENTRIC (SYRINGE) ×6 IMPLANT
SYR 5ML LUER SLIP (SYRINGE) ×3 IMPLANT
SYR CONTROL 10ML LL (SYRINGE) ×4 IMPLANT
SYRINGE 10CC LL (SYRINGE) ×1 IMPLANT
TAPE CLOTH SURG 4X10 WHT LF (GAUZE/BANDAGES/DRESSINGS) ×1 IMPLANT
TOWEL OR 17X24 6PK STRL BLUE (TOWEL DISPOSABLE) ×3 IMPLANT
TOWEL OR 17X26 10 PK STRL BLUE (TOWEL DISPOSABLE) ×3 IMPLANT
WATER STERILE IRR 1000ML POUR (IV SOLUTION) IMPLANT

## 2013-07-28 NOTE — Progress Notes (Signed)
Pt is very sleepy/sedated. Norva Riffle RN reported to me that pt admitted to taking meds preop and was very sedated before surgery

## 2013-07-28 NOTE — Preoperative (Signed)
Beta Blockers   Reason not to administer Beta Blockers:Not Applicable, Pt took Metoprolol at 0800 today 

## 2013-07-28 NOTE — Progress Notes (Signed)
Receiving report from D. Madilyn Hook RN at this time

## 2013-07-28 NOTE — Transfer of Care (Signed)
Immediate Anesthesia Transfer of Care Note  Patient: [name]  Procedure(s) Performed: Procedure(s): RIGHT TOTAL  MASTECTOMY (Right) ATTEMPTED INSERTION PORT-A-CATH (Left)  Patient Location: PACU  Anesthesia Type:General  Level of Consciousness: patient cooperative  Airway & Oxygen Therapy: Patient Spontanous Breathing and Patient connected to nasal cannula oxygen  Post-op Assessment: Report given to PACU RN, Post -op Vital signs reviewed and stable and Patient moving all extremities X 4  Post vital signs: Reviewed and stable  Complications: No apparent anesthesia complications

## 2013-07-28 NOTE — Progress Notes (Signed)
UR completed.  Edmund Holcomb, RN BSN [facility] CCM Trauma/Neuro ICU Case Manager [phone]  

## 2013-07-28 NOTE — Anesthesia Procedure Notes (Signed)
Procedure Name: LMA Insertion Date/Time: 07/28/2013 11:16 AM Performed by: Rogelia Boga Pre-anesthesia Checklist: Timeout performed, Patient identified, Emergency Drugs available, Suction available and Patient being monitored Patient Re-evaluated:Patient Re-evaluated prior to inductionOxygen Delivery Method: Circle system utilized Preoxygenation: Pre-oxygenation with 100% oxygen Intubation Type: IV induction LMA: LMA inserted LMA Size: 4.0 Number of attempts: 1 Placement Confirmation: positive ETCO2 and breath sounds checked- equal and bilateral Tube secured with: Tape Dental Injury: Teeth and Oropharynx as per pre-operative assessment

## 2013-07-28 NOTE — Telephone Encounter (Signed)
per 12/3 pof added lb to f/u w/GM 09/02/13 and cx'd appt for lb/FS on 08/30/13. pt seeing GM 09/02/13. lmonvm for pt re changes and asked that she get new schedule when she comes in for 12/30 appt.

## 2013-07-28 NOTE — Consult Note (Signed)
HPI: [name] [name] is an 67 y.o. female with newly diagnosed breast cancer. To OR today for right mastectomy as well as port. However, post could not be completed as guidewire would not pass into SVC. IR is requested to assist with port placement prior to pt discharge. Pt tolerated procedure today, has just arrived to unit from PACU, still sleepy but family present. PMHx, meds reviewed.  Past Medical History:  Past Medical History  Diagnosis Date  . Sinus tachycardia     mild resting  . HLD (hyperlipidemia)   . Orthostatic hypotension     slight in the past  . Anxiety   . Depression   . Diverticulosis   . Fibromyalgia 1978  . Hypothyroidism   . Alopecia   . Ruptured disk     one in neck and two in back  . Plantar fasciitis   . Arthritis   . DDD (degenerative disc disease)   . History of breast cancer     1997   s/p  right breast lumpectomy w/ node dissection  . GERD (gastroesophageal reflux disease)   . CAP (community acquired pneumonia)     admission 06-04-2013 secondary to failed oral antibitotics---  last cxr 06-16-2013  much improved  . History of syncope     episode 2008--  no recurrence since  . Ureteral calculi     bilateral  . Leukocytosis   . Chronic back pain   . 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 ear   . SOB (shortness of breath) 06-17-2013 CURRENTLY RESIDUAL FROM RECENT CAP    SOB from obesity and deconditioning 2009- eval included CPX  . Urgency of urination   . Hematuria   . History of kidney stones   . Sensation of pressure in bladder area   . Wears glasses   . Ecchymosis     FROM IV AND LAB WORK THE PAST WEEK  06-17-2013  . Stool incontinence     "at times recently"   . CLL (chronic lymphocytic leukemia)   . Breast cancer     right    Past Surgical History:  Past Surgical History  Procedure Laterality Date  . Lumbar epidural injection      has had 7 injections  . Total abdominal hysterectomy w/ bilateral salpingoophorectomy  1997  . Breast lumpectomy with  axillary lymph node dissection Right 1997  . Transthoracic echocardiogram  05-11-2007  dr Myrtis Ser    normal lvf/  ef 65%  . Tonsillectomy  AGE 71  . Retinal detachment surgery Right 2013  . Cystoscopy with retrograde pyelogram, ureteroscopy and stent placement Bilateral 06/17/2013    Procedure: CYSTOSCOPY WITH RETROGRADE PYELOGRAM, URETEROSCOPY AND LEFT DOUBLE  J STENT PLACEMENT RIGHT URETERAL HOLMIIUM LASER AND DOUBLE J STENT ;  Surgeon: Lindaann Slough, MD;  Location: [facility] [place];  Service: Urology;  Laterality: Bilateral;  . Holmium laser application Bilateral 06/17/2013    Procedure: HOLMIUM LASER APPLICATION;  Surgeon: Lindaann Slough, MD;  Location: [facility] [place];  Service: Urology;  Laterality: Bilateral;  . Breast surgery      Family History:  Family History  Problem Relation Age of Onset  . Heart disease Maternal Grandfather   . Diabetes Maternal Grandfather   . Colon cancer Maternal Aunt   . Other Paternal Grandmother     brain tumor  . Dementia Mother   . Diabetes Mother   . Osteoporosis Mother   . Diabetes Maternal Aunt   . Prostate cancer Father   . Brain  cancer Maternal Grandmother   . Bipolar disorder Maternal Aunt   . Stroke Maternal Aunt     Social History:  reports that she has never smoked. She has never used smokeless tobacco. She reports that she does not drink alcohol or use illicit drugs.  Allergies:  Allergies  Allergen Reactions  . Amoxicillin Itching  . Lasix [Furosemide] Nausea And Vomiting and Other (See Comments)    headache  . Lithium Other (See Comments)    Dizziness, "cause me to fall"  . Sulfa Antibiotics Hives  . Trazodone And Nefazodone Other (See Comments)    insomnia  . Lyrica [Pregabalin] Diarrhea, Nausea Only and Rash    Medications:   Medication List    ASK your doctor about these medications       ALPRAZolam 0.5 MG tablet  Commonly known as:  XANAX  Take 0.5 mg by mouth every 8 (eight) hours as  needed for anxiety.     amitriptyline 150 MG tablet  Commonly known as:  ELAVIL  Take 150 mg by mouth at bedtime.     buPROPion 150 MG 12 hr tablet  Commonly known as:  WELLBUTRIN SR  Take 150-300 mg by mouth 2 (two) times daily. Take 300mg  in the morning and 150mg  at 2pm     cetirizine 10 MG tablet  Commonly known as:  ZYRTEC  Take 10 mg by mouth daily.     clindamycin 300 MG capsule  Commonly known as:  CLEOCIN  Take 300 mg by mouth 3 (three) times daily.     diphenhydrAMINE 25 MG tablet  Commonly known as:  BENADRYL  Take 25 mg by mouth every 6 (six) hours as needed for itching.     FISH OIL PO  Take 1 capsule by mouth daily.     gabapentin 300 MG capsule  Commonly known as:  NEURONTIN  Take 300 mg by mouth daily as needed (sleep). 300mg  with evening meal, 300mg  at bedtime     HAIR VITAMINS PO  Take 1 tablet by mouth 2 (two) times daily.     ibuprofen 200 MG tablet  Commonly known as:  ADVIL,MOTRIN  Take 800 mg by mouth every 6 (six) hours as needed for moderate pain.     levothyroxine 50 MCG tablet  Commonly known as:  SYNTHROID, LEVOTHROID  Take 50 mcg by mouth daily before breakfast.     metoprolol succinate 25 MG 24 hr tablet  Commonly known as:  TOPROL-XL  Take 25 mg by mouth daily.     omeprazole 20 MG capsule  Commonly known as:  PRILOSEC  Take 20 mg by mouth every morning.     Oxcarbazepine 300 MG tablet  Commonly known as:  TRILEPTAL  Take 300 mg by mouth 2 (two) times daily.     oxyCODONE-acetaminophen 10-325 MG per tablet  Commonly known as:  PERCOCET  Take 1 tablet by mouth 4 (four) times daily as needed for pain.     ROGAINE 2 % external solution  Generic drug:  minoxidil  Apply 1 application topically 2 (two) times daily as needed.     VIIBRYD 20 MG Tabs  Generic drug:  Vilazodone HCl  Take 1 tablet by mouth daily.     vitamin B-12 1000 MCG tablet  Commonly known as:  CYANOCOBALAMIN  Take 1,000 mcg by mouth daily.     vitamin C 1000  MG tablet  Take 1,000 mg by mouth daily.     Vitamin D-3 1000 UNITS Caps  Take 5,000 Units by mouth daily.        Please HPI for pertinent positives, otherwise complete 10 system ROS negative.  Physical Exam: BP 156/77  Pulse 92  Temp(Src) 97.2 F (36.2 C) (Oral)  Resp 15  SpO2 99%  LMP 08/26/1995 There is no weight on file to calculate BMI.   General Appearance:  Awake but sleepy. Family at bedside  Head:  Normocephalic, without obvious abnormality, atraumatic  ENT: Unremarkable  Neck: Supple, symmetrical, trachea midline  Lungs:   Clear to auscultation bilaterally, no w/r/r,  Chest Wall:  No tenderness or deformity, dressing right ant chest c/w mastectomy site.  Heart:  Regular rate and rhythm, S1, S2 normal, no murmur, rub or gallop.  Abdomen:   Soft, non-tender, non distended.  Neurologic: Normal affect, no gross deficits.   Results for orders placed in visit on 07/27/13 (from the past 48 hour(s))  CBC WITH DIFFERENTIAL     Status: Abnormal   Collection Time    07/27/13  9:29 AM      Result Value Range   WBC 10.4 (*) 3.9 - 10.3 10e3/uL   NEUT# 3.6  1.5 - 6.5 10e3/uL   HGB 10.4 (*) 11.6 - 15.9 g/dL   HCT 40.9 (*) 81.1 - 91.4 %   Platelets 168  145 - 400 10e3/uL   MCV 86.7  79.5 - 101.0 fL   MCH 27.9  25.1 - 34.0 pg   MCHC 32.2  31.5 - 36.0 g/dL   RBC 7.82  9.56 - 2.13 10e6/uL   RDW 14.8 (*) 11.2 - 14.5 %   lymph# 5.5 (*) 0.9 - 3.3 10e3/uL   MONO# 0.6  0.1 - 0.9 10e3/uL   Eosinophils Absolute 0.6 (*) 0.0 - 0.5 10e3/uL   Basophils Absolute 0.0  0.0 - 0.1 10e3/uL   NEUT% 35.0 (*) 38.4 - 76.8 %   LYMPH% 53.2 (*) 14.0 - 49.7 %   MONO% 5.8  0.0 - 14.0 %   EOS% 5.6  0.0 - 7.0 %   BASO% 0.4  0.0 - 2.0 %  TECHNOLOGIST REVIEW     Status: None   Collection Time    07/27/13  9:29 AM      Result Value Range   Technologist Review Few Variant Lymphs    COMPREHENSIVE METABOLIC PANEL (CC13)     Status: Abnormal   Collection Time    07/27/13  9:29 AM      Result Value  Range   Sodium 146 (*) 136 - 145 mEq/L   Potassium 3.8  3.5 - 5.1 mEq/L   Chloride 111 (*) 98 - 109 mEq/L   CO2 23  22 - 29 mEq/L   Glucose 100  70 - 140 mg/dl   BUN 08.6  7.0 - 57.8 mg/dL   Creatinine 0.8  0.6 - 1.1 mg/dL   Total Bilirubin 4.69  0.20 - 1.20 mg/dL   Alkaline Phosphatase 73  40 - 150 U/L   AST 24  5 - 34 U/L   ALT 25  0 - 55 U/L   Total Protein 6.3 (*) 6.4 - 8.3 g/dL   Albumin 3.9  3.5 - 5.0 g/dL   Calcium 9.3  8.4 - 62.9 mg/dL   Anion Gap 11  3 - 11 mEq/L   Dg Chest 2 View  07/27/2013   CLINICAL DATA:  Right total mastectomy.  Shortness of breath.  EXAM: CHEST  2 VIEW  COMPARISON:  06/16/2013.  FINDINGS: There is no focal  parenchymal opacity, pleural effusion, or pneumothorax. The heart and mediastinal contours are unremarkable. There are surgical which clips in the right axilla from an axillary dissection.  The osseous structures are unremarkable.  IMPRESSION: No active cardiopulmonary disease.   Electronically Signed   By: Elige Ko   On: 07/27/2013 14:48   Dg Chest Port 1 View  07/28/2013   CLINICAL DATA:  Failed left Port-A-Cath inflation.  EXAM: PORTABLE CHEST - 1 VIEW  COMPARISON:  Chest x-ray at July 27, 2013.  FINDINGS: The lung volumes are low. No pneumothorax is demonstrated. There is increased density that projects in the left lower lobe suggesting atelectasis or developing pneumonia. The cardiac silhouette is less well-defined today. New increased density in the left AP window region has appeared since yesterday's study. A portion of this is related to the AP portable technique but a true change has appeared. The central pulmonary vascularity is prominent. There are transversely oriented skin staples over the right hemidiaphragmatic level. There is likely a subcutaneous surgical drain in place.There are stable surgical clips in the right axillary region.  IMPRESSION: 1. There is no definite evidence of a postprocedure pneumothorax. 2. There has been a dramatic  change in the appearance of the mediastinum especially to the left of midline since yesterday's study. This likely reflects the development of atelectasis or pneumonia principally in the left lower lobe but the change in the left hilar region is unusual. Followup chest CT scanning is recommended. 3. There postsurgical changes on the right presumably from mastectomy.   Electronically Signed   By: David  [place]   On: 07/28/2013 14:11    Assessment/Plan Breast cancer, s/p right mastectomy. Uncertain if going to receive radiation per Dr. Corliss Skains Discussed placement of left sided Port. Explained procedure, risks, complications, use of sedation to pt and family. Pt is not to sign consent today as she is still under effects of anesthesia. Family had no questions. Will have pt sign consent in am prior to procedure.  Brayton El PA-C 07/28/2013, 4:11 PM

## 2013-07-28 NOTE — Interval H&P Note (Signed)
History and Physical Interval Note:  07/28/2013 10:10 AM  [name] [name]  has presented today for surgery, with the diagnosis of reccurent right breast cancer  The various methods of treatment have been discussed with the patient and family. After consideration of risks, benefits and other options for treatment, the patient has consented to  Procedure(s): RIGHT TOTAL  MASTECTOMY WITH PORT-A-CATH INSERTION (Right) INSERTION PORT-A-CATH (N/A) as a surgical intervention .  The patient's history has been reviewed, patient examined, no change in status, stable for surgery.  I have reviewed the patient's chart and labs.  Questions were answered to the patient's satisfaction.     [name] K.

## 2013-07-28 NOTE — Anesthesia Preprocedure Evaluation (Addendum)
Anesthesia Evaluation  Patient identified by MRN, date of birth, ID band Patient awake    Reviewed: Allergy & Precautions, H&P , NPO status , Patient's Chart, lab work & pertinent test results  Airway Mallampati: II TM Distance: >3 FB Neck ROM: full    Dental  (+) Teeth Intact and Dental Advisory Given   Pulmonary shortness of breath and with exertion,          Cardiovascular     Neuro/Psych Anxiety Depression  Neuromuscular disease    GI/Hepatic GERD-  Medicated and Controlled,  Endo/Other  Hypothyroidism obese  Renal/GU      Musculoskeletal  (+) Arthritis -, Osteoarthritis,  Fibromyalgia -  Abdominal   Peds  Hematology H/o CLL   Anesthesia Other Findings   Reproductive/Obstetrics                          Anesthesia Physical Anesthesia Plan  ASA: III  Anesthesia Plan: General   Post-op Pain Management:    Induction: Intravenous  Airway Management Planned: LMA  Additional Equipment:   Intra-op Plan:   Post-operative Plan:   Informed Consent: I have reviewed the patients History and Physical, chart, labs and discussed the procedure including the risks, benefits and alternatives for the proposed anesthesia with the patient or authorized representative who has indicated his/her understanding and acceptance.   Dental advisory given  Plan Discussed with: CRNA, Anesthesiologist and Surgeon  Anesthesia Plan Comments:        Anesthesia Quick Evaluation

## 2013-07-28 NOTE — Op Note (Signed)
Preop diagnosis:  Recurrent right breast cancer (T1 C. N0) Postop diagnosis: Same Procedure performed: Right completion mastectomy   Attempted left subclavian port placement Surgeon  German Manke K. Assistant: Dr. Abigail Miyamoto Anesthesia: Gen. Endotracheal Indications:This is a 67 year old female who is status post Right partial mastectomy and right axillary lymph node dissection by Dr. Lorelee New in 1997. She underwent postoperative radiation as well as tamoxifen. She did not receive chemotherapy. Recently she underwent a routine screening mammogram that was normal. However last month she felt a small lump in her right breast. Ultrasound and mammogram showed a 1.4 cm mass at 8:00 in the right breast. 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  She was seen by oncology who recommended port placement for postoperative chemotherapy. She also presents now for completion mastectomy on the right.  Description of procedure: The patient was brought to the operating room and placed in a supine position on the operating room table. After an adequate level of general anesthesia was obtained, her left chest and neck were prepped with ChloraPrep and draped in sterile fashion. A timeout was taken to ensure the proper patient and proper procedure. A roll had been placed behind her shoulders. We placed her in Trendelenburg position. We cannulated the left subclavian vein with an 18-gauge needle. This was done on the first pass with good blood return. We then passed the guidewire under direct fluoroscopic guidance. The wire seemed to pass across the mediastinum to the right but then seemed to deflect upwards into the right internal jugular vein. I tried to maneuver the wire so would pass inferiorly into the superior vena cava but this was unsuccessful. We removed the wire. I moved my insertion site more lateral and again cannulated the subclavian vein  on the first pass. Again the wire passed across the mediastinum and then seemed to deflect up into the right internal jugular vein. Despite repeated attempts to redirect the wire does remain unsuccessful. We then aborted the port placement. We will ask interventional radiology to place this port tomorrow as their equipment may allow them to direct the wire inferiorly.The patient seemed to tolerate this part of the procedure well with no change in her vital signs.   We then removed the drapes and repositioned and reprepped the right chest and axilla with chlor prep. We draped in sterile fashion. Another timeout was taken. I outlined an elliptical incision to include the previous lumpectomy scar as well as the nipple area looks complex. We made the skin incision with a #10 blade. Cautery was then used to raise skin flaps. We raised the superior flap to the pectoralis muscle on the infraclavicular fossa. We dissected medially to the edge of the sternum. We dissected laterally to the edge of the pectoralis muscle. I then raised skin flaps in fairly to the inframammary crease. We continued laterally until I identified the anterior edge of the latissimus. We then dissected into the axilla. There seemed to be a moderate amount of scarring in this area. We removed as much of the fatty axillary contents back to the latissimus muscle. The specimen was removed and oriented with a suture with the long suture lateral and a short suture superior. This was sent for pathologic examination. I then palpated the skin flaps. The tumor was located in the lower outer quadrant. In this area there was a small nodularity on the posterior surface of the skin flap. We excised this off of the dermis and  sent this as an additional inferior margin. We then irrigated the wound thoroughly and inspected for hemostasis. A single 19 French drain was placed through a stab incision in the axilla and threaded across the axilla and the inframammary  crease. This was secured with a 2-0 nylon suture. The wound was closed with interrupted subcutaneous sutures of 3-0 Vicryl. Staples were used to close the skin. A dry dressing was applied. The patient was placed in a breast binder. She was then extubated and brought to the recovery room in stable condition. All sponge, instrument, and needle counts are correct. Chest x-ray is pending.  Wilmon Arms. Corliss Skains, MD, [facility]  General/ Trauma Surgery  07/28/2013 1:13 PM

## 2013-07-28 NOTE — H&P (View-Only) (Signed)
Patient ID: [name] [name], female   DOB: [birth year], 67 y.o.   MRN: [id number]  Chief Complaint  Patient presents with  . New Evaluation    new breast ca    HPI [name] [name] is a 67 y.o. female.  Referred by Dr. 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 This surgery was performed in 1997. She underwent postoperative radiation as well as tamoxifen x5 years. She did not receive chemotherapy. She had been doing well for the last several years  And recently had a normal screening mammogram in February of 2014. In November she felt a small lump in her right breast but she states that this has since resolved. However secondary to feeling this lump she had an ultrasound and mammogram on 07/13/13. The ultrasound showed a 1.4 cm oval mass at 8:00. The diagnostic mammogram was unremarkable. 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  She denies any right arm swelling or numbness at this time.  She was recently diagnosed with CLL and is followed by Dr. Eli Hose.  Past Medical History  Diagnosis Date  . Sinus tachycardia     mild resting  . HLD (hyperlipidemia)   . Orthostatic hypotension     slight in the past  . Anxiety   . Depression   . Diverticulosis   . Fibromyalgia 1978  . Hypothyroidism   . Alopecia   . Ruptured disk     one in neck and two in back  . Plantar fasciitis   . Arthritis   . DDD (degenerative disc disease)   . History of breast cancer     1997   s/p  right breast lumpectomy w/ node dissection  . GERD (gastroesophageal reflux disease)   . CAP (community acquired pneumonia)     admission 06-04-2013 secondary to failed oral antibitotics---  last cxr 06-16-2013  much improved  . History of syncope     episode 2008--  no recurrence since  . Ureteral calculi      bilateral  . Leukocytosis   . Chronic back pain   . 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 ear   . SOB (shortness of breath) 06-17-2013 CURRENTLY RESIDUAL FROM RECENT CAP    SOB from obesity and deconditioning 2009- eval included CPX  . Urgency of urination   . Hematuria   . History of kidney stones   . Sensation of pressure in bladder area   . Wears glasses   . Ecchymosis     FROM IV AND LAB WORK THE PAST WEEK  06-17-2013    Past Surgical History  Procedure Laterality Date  . Lumbar epidural injection      has had 7 injections  . Total abdominal hysterectomy w/ bilateral salpingoophorectomy  1997  . Breast lumpectomy with axillary lymph node dissection Right 1997  . Transthoracic echocardiogram  05-11-2007  dr Myrtis Ser    normal lvf/  ef 65%  . Tonsillectomy  AGE 29  . Retinal detachment surgery Right 2013  . Cystoscopy with retrograde pyelogram, ureteroscopy and stent placement Bilateral 06/17/2013    Procedure: CYSTOSCOPY WITH RETROGRADE PYELOGRAM, URETEROSCOPY AND LEFT DOUBLE  J STENT PLACEMENT RIGHT URETERAL HOLMIIUM LASER AND DOUBLE J STENT ;  Surgeon: Lindaann Slough, MD;  Location: [facility];  Service: Urology;  Laterality: Bilateral;  . Holmium laser application Bilateral  06/17/2013    Procedure: HOLMIUM LASER APPLICATION;  Surgeon: Lindaann Slough, MD;  Location: [facility];  Service: Urology;  Laterality: Bilateral;    Family History  Problem Relation Age of Onset  . Heart disease Maternal Grandfather   . Diabetes Maternal Grandfather   . Colon cancer Maternal Aunt   . Other Paternal Grandmother     brain tumor  . Dementia Mother   . Diabetes Mother   . Osteoporosis Mother   . Diabetes Maternal Aunt   . Prostate cancer Father   . Brain cancer Maternal Grandmother   . Bipolar disorder Maternal Aunt   . Stroke Maternal Aunt     Social History History  Substance Use Topics  . Smoking status: Never Smoker   . Smokeless tobacco: Never Used  .  Alcohol Use: No    Allergies  Allergen Reactions  . Amoxicillin Itching  . Lasix [Furosemide] Nausea And Vomiting and Other (See Comments)    headache  . Lithium Other (See Comments)    dizziness  . Sulfa Antibiotics Hives  . Trazodone And Nefazodone Other (See Comments)    insomnia  . Lyrica [Pregabalin] Diarrhea, Nausea Only and Rash    Current Outpatient Prescriptions  Medication Sig Dispense Refill  . amitriptyline (ELAVIL) 150 MG tablet Take 150 mg by mouth at bedtime.      Marland Kitchen buPROPion (ZYBAN) 150 MG 12 hr tablet Take 300 mg by mouth 2 (two) times daily.       Marland Kitchen levothyroxine (SYNTHROID, LEVOTHROID) 50 MCG tablet TAKE ONE TABLET BY MOUTH EVERY DAY  30 tablet  13  . metoprolol succinate (TOPROL-XL) 25 MG 24 hr tablet Take 25 mg by mouth daily.      . Multiple Vitamins-Minerals (MULTIVITAMIN PO) Take 1 tablet by mouth daily.       . Omega-3 Fatty Acids (FISH OIL PO) Take 1 capsule by mouth daily.       Marland Kitchen omeprazole (PRILOSEC) 20 MG capsule Take 20 mg by mouth every morning.       . Oxcarbazepine (TRILEPTAL) 300 MG tablet Take 300 mg by mouth 2 (two) times daily.      . Vilazodone HCl (VIIBRYD) 20 MG TABS Take 1 tablet by mouth daily.      Marland Kitchen ALPRAZolam (XANAX) 0.5 MG tablet TAKE 1 TABLET BY MOUTH EVERY 8 HOURS AS NEEDED  30 tablet  0  . gabapentin (NEURONTIN) 300 MG capsule Take 300 mg by mouth daily as needed (sleep). 300mg  with evening meal, 300mg  at bedtime      . oxyCODONE-acetaminophen (PERCOCET/ROXICET) 5-325 MG per tablet Take 1 tablet by mouth every 4 (four) hours as needed for pain. For pain       No current facility-administered medications for this visit.    Review of Systems Review of Systems  Constitutional: Negative for fever, chills and unexpected weight change.  HENT: Negative for congestion, hearing loss, sore throat, trouble swallowing and voice change.   Eyes: Negative for visual disturbance.  Respiratory: Negative for cough and wheezing.   Cardiovascular:  Negative for chest pain, palpitations and leg swelling.  Gastrointestinal: Negative for nausea, vomiting, abdominal pain, diarrhea, constipation, blood in stool, abdominal distention and anal bleeding.  Genitourinary: Negative for hematuria, vaginal bleeding and difficulty urinating.  Musculoskeletal: Positive for arthralgias and myalgias.  Skin: Negative for rash and wound.  Neurological: Negative for seizures, syncope and headaches.  Hematological: Negative for adenopathy. Does not bruise/bleed easily.  Psychiatric/Behavioral: Negative for confusion. The patient  is nervous/anxious.     Blood pressure 110/64, pulse 76, temperature 98 F (36.7 C), temperature source Temporal, resp. rate 14, height 5\' 4"  (1.626 m), weight 177 lb (80.287 kg), last menstrual period 08/26/1995.  Physical Exam Physical Exam WDWN in NAD HEENT:  EOMI, sclera anicteric Neck:  No masses, no thyromegaly Lungs:  CTA bilaterally; normal respiratory effort Breasts:  Smaller on right; healed axillary and lumpectomy incisions; no palpable masses in either breast; no axillary lymphadenopathy CV:  Regular rate and rhythm; no murmurs Abd:  +bowel sounds, soft, non-tender, no masses Ext:  Well-perfused; no edema Skin:  Warm, dry; no sign of jaundice  Data Reviewed Mammogram/ U/S from [facility] report  Assessment    Recurrent right breast cancer s/p R lumpectomy/ lymph node dissection/ XRT, tamoxifen.  Also with recent diagnosis of CLL.     Plan    Recommend a completion right mastectomy. Since she has had a full lymph node dissection followed by radiation, I do not feel that there is any benefit to attempting sentinel lymph node biopsy as it is very likely to be unsuccessful. We will attempt to find some lymph nodes in the axillary tail of the breast.The surgical procedure has been discussed with the patient.  Potential risks, benefits, alternative treatments, and expected outcomes have been explained.  All of the  patient's questions at this time have been answered.  The likelihood of reaching the patient's treatment goal is good.  The patient understand the proposed surgical procedure and wishes to proceed.         Mychal Decarlo K. 07/18/2013, 12:20 PM

## 2013-07-28 NOTE — Anesthesia Postprocedure Evaluation (Signed)
Anesthesia Post Note  Patient: [name] [name]  Procedure(s) Performed: Procedure(s) (LRB): RIGHT TOTAL  MASTECTOMY (Right) ATTEMPTED INSERTION PORT-A-CATH (Left)  Anesthesia type: General  Patient location: PACU  Post pain: Pain level controlled and Adequate analgesia  Post assessment: Post-op Vital signs reviewed, Patient's Cardiovascular Status Stable, Respiratory Function Stable, Patent Airway and Pain level controlled  Last Vitals:  Filed Vitals:   07/28/13 1443  BP: 166/82  Pulse: 87  Temp:   Resp: 12    Post vital signs: Reviewed and stable  Level of consciousness: awake, alert  and oriented  Complications: No apparent anesthesia complications

## 2013-07-29 ENCOUNTER — Encounter (HOSPITAL_COMMUNITY): Payer: Self-pay | Admitting: Radiology

## 2013-07-29 ENCOUNTER — Inpatient Hospital Stay (HOSPITAL_COMMUNITY): Payer: Medicare Other

## 2013-07-29 LAB — CBC
HCT: 29.9 % — ABNORMAL LOW (ref 36.0–46.0)
MCHC: 33.1 g/dL (ref 30.0–36.0)
MCV: 88.5 fL (ref 78.0–100.0)
RDW: 14.6 % (ref 11.5–15.5)
WBC: 9.2 10*3/uL (ref 4.0–10.5)

## 2013-07-29 LAB — BASIC METABOLIC PANEL
BUN: 4 mg/dL — ABNORMAL LOW (ref 6–23)
Chloride: 109 mEq/L (ref 96–112)
Creatinine, Ser: 0.71 mg/dL (ref 0.50–1.10)
GFR calc Af Amer: >90 mL/min (ref >90)
Glucose, Bld: 98 mg/dL (ref 70–99)
Potassium: 3.8 mEq/L (ref 3.5–5.1)

## 2013-07-29 LAB — PROTIME-INR: INR: 0.94 (ref 0.00–1.49)

## 2013-07-29 MED ORDER — FENTANYL CITRATE 0.05 MG/ML IJ SOLN
INTRAMUSCULAR | Status: AC | PRN
  Administered 2013-07-29: 25 ug via INTRAVENOUS

## 2013-07-29 MED ORDER — CEFAZOLIN SODIUM-DEXTROSE 2-3 GM-% IV SOLR
2.0000 g | INTRAVENOUS | Status: AC
  Administered 2013-07-29: 2 g via INTRAVENOUS
  Filled 2013-07-29: qty 50

## 2013-07-29 MED ORDER — MIDAZOLAM HCL 2 MG/2ML IJ SOLN
INTRAMUSCULAR | Status: AC
  Filled 2013-07-29: qty 4

## 2013-07-29 MED ORDER — MIDAZOLAM HCL 2 MG/2ML IJ SOLN
INTRAMUSCULAR | Status: AC | PRN
  Administered 2013-07-29: 1 mg via INTRAVENOUS

## 2013-07-29 MED ORDER — FENTANYL CITRATE 0.05 MG/ML IJ SOLN
INTRAMUSCULAR | Status: AC | PRN
  Administered 2013-07-29 (×3): 25 ug via INTRAVENOUS
  Administered 2013-07-29: 50 ug via INTRAVENOUS
  Administered 2013-07-29: 25 ug via INTRAVENOUS

## 2013-07-29 MED ORDER — IOHEXOL 300 MG/ML  SOLN
100.0000 mL | Freq: Once | INTRAMUSCULAR | Status: AC | PRN
  Administered 2013-07-29: 100 mL via INTRAVENOUS

## 2013-07-29 MED ORDER — HYDROMORPHONE HCL PF 1 MG/ML IJ SOLN
INTRAMUSCULAR | Status: AC
  Administered 2013-07-29: 1 mg
  Filled 2013-07-29: qty 1

## 2013-07-29 MED ORDER — MIDAZOLAM HCL 2 MG/2ML IJ SOLN
INTRAMUSCULAR | Status: AC | PRN
  Administered 2013-07-29: 2 mg via INTRAVENOUS
  Administered 2013-07-29 (×2): 1 mg via INTRAVENOUS

## 2013-07-29 MED ORDER — IBUPROFEN 400 MG PO TABS
400.0000 mg | ORAL_TABLET | Freq: Four times a day (QID) | ORAL | Status: DC | PRN
  Administered 2013-07-29: 400 mg via ORAL
  Filled 2013-07-29: qty 1

## 2013-07-29 MED ORDER — FENTANYL CITRATE 0.05 MG/ML IJ SOLN
INTRAMUSCULAR | Status: AC
  Filled 2013-07-29: qty 4

## 2013-07-29 NOTE — ED Notes (Signed)
Pt taken for CT. Negative result. Pt back to IR for [facility] placement

## 2013-07-29 NOTE — Progress Notes (Signed)
1 Day Post-Op  Subjective: Sleepy after port placement.  No significant pain.  Complains of headache.   Objective: Vital signs in last 24 hours: Temp:  [97.2 F (36.2 C)-98.5 F (36.9 C)] 98.5 F (36.9 C) (12/05 1345) Pulse Rate:  [86-109] 98 (12/05 1345) Resp:  [10-29] 16 (12/05 1345) BP: (111-166)/(49-93) 133/69 mmHg (12/05 1345) SpO2:  [87 %-100 %] 87 % (12/05 1345) Weight:  [179 lb 15.7 oz (81.64 kg)] 179 lb 15.7 oz (81.64 kg) (12/05 0048) Last BM Date: 07/28/13  Intake/Output from previous day: 12/04 0701 - 12/05 0700 In: 2622.5 [P.O.:320; I.V.:2302.5] Out: 175 [Drains:175] Intake/Output this shift:    General appearance: alert, cooperative and no distress Chest wall: no tenderness, left subclavian area - dressing dry; no hematoma Right breast - serosanguinous drainage; staple line c/d/i  Lab Results:   Recent Labs  07/28/13 1705 07/29/13 0644  WBC 12.6* 9.2  HGB 10.2* 9.9*  HCT 32.8* 29.9*  PLT 150 129*   BMET  Recent Labs  07/27/13 0929 07/28/13 1705 07/29/13 0644  NA 146*  --  142  K 3.8  --  3.8  CL  --   --  109  CO2 23  --  24  GLUCOSE 100  --  98  BUN 10.1  --  4*  CREATININE 0.8 0.67 0.71  CALCIUM 9.3  --  8.3*   PT/INR  Recent Labs  07/29/13 0644  LABPROT 12.4  INR 0.94   ABG No results found for this basename: PHART, PCO2, PO2, HCO3,  in the last 72 hours  Studies/Results: Ct Chest W Contrast  07/29/2013   CLINICAL DATA:  Postop right mastectomy, attempted left subclavian port catheter, abnormal chest x-ray with mediastinal widening  EXAM: CT CHEST WITH CONTRAST  TECHNIQUE: Multidetector CT imaging of the chest was performed during intravenous contrast administration.  CONTRAST:  OMNIPAQUE IOHEXOL 300 MG/ML  SOLN  COMPARISON:  07/28/2013 chest x-rays  FINDINGS: Postop changes from recent right mastectomy and axillary lymph node dissection. Surgical drain in the right axilla extending over the anterior chest. Scattered air  throughout the surgical site. Overlying skin staples noted. No mediastinal abnormality or hemorrhage. Intact thoracic aorta. Innominate veins and SVC are patent. Normal heart size and vascularity. No pericardial or pleural effusion. No significant hiatal hernia. Negative for adenopathy.  Lung windows demonstrate dependent bibasilar atelectasis. No pneumothorax, pulmonary hemorrhage, contusion, or focal airspace process. Trachea and central airways are patent. Additional peripheral subsegmental atelectasis in the inferior right upper lobe along the right fissure.  Included upper abdomen demonstrates some fatty replacement of the pancreas proximally. No acute upper abdominal findings. Atherosclerotic changes of the abdominal vasculature.  IMPRESSION: Negative for pneumothorax or pleural effusion.  Low volume chest exam with atelectasis.  No mediastinal abnormality or hemorrhage  Postop changes from recent right mastectomy   Electronically Signed   By: Ruel Favors M.D.   On: 07/29/2013 10:29   Ir Fluoro Guide Cv Line Left  07/29/2013   CLINICAL DATA:  Breast cancer  EXAM: ULTRASOUND GUIDANCE FOR VASCULAR ACCESS  Left internal jugular PORT CATHETER INSERTION  Date:  07/29/2013 1:41 PM12/12/2012 1:41 PM  Radiologist:  Judie Petit. Ruel Favors, MD  Guidance:  Ultrasound and fluoroscopic  MEDICATIONS AND MEDICAL HISTORY: 2 g Ancef administered within 1 hr of the procedure, 4 mg bursae, 150 mcg fentanyl  ANESTHESIA/SEDATION: 90 min  CONTRAST:  None.  FLUOROSCOPY TIME:  4 min 12 seconds  PROCEDURE: Informed consent was obtained from the patient  following explanation of the procedure, risks, benefits and alternatives. The patient understands, agrees and consents for the procedure. All questions were addressed. A time out was performed.  Under sterile conditions and local anesthesia, left micropuncture venous access was performed with ultrasound. Images were obtained for documentation. A guide wire was inserted followed by a  4-French dilator. A catheter and guidewire were utilized to access the IVC.  Measurements were obtained from the SVC/RA junction back to the venotomy site. In the infra clavicular chest, a subcutaneous pocket was created under sterile conditions and local anesthesia. 1% lidocaine with epinephrine was utilized for this. A 2.5 cm incision was made the skin. Blunt dissection was performed to create a subcutaneous pocket. Hemostasis was obtained with electrocautery. The pocket was flushed with saline. The port catheter was assembled and checked for leakage. The port catheter was secured in the pocket with two retention sutures. The tubing was tunneled subcutaneously to the venotomy site and inserted into the SVC/RA junction through a valved peel-away sheath. Position was confirmed with fluoroscopy. No immediate complications. The patient tolerated the procedure well.  Incisions were closed with Vicryl suture and Dermabond. The port catheter was accessed, blood was aspirated followed by saline and heparin flushes. Needle was removed. A sterile dressing was applied.  COMPLICATIONS: No immediate  IMPRESSION: Successful left IJ single lumen power port catheter insertion. Tip SVC RA junction. Access ready for use.   Electronically Signed   By: Ruel Favors M.D.   On: 07/29/2013 13:44   Ir US Guide Vasc Access Left  07/29/2013   CLINICAL DATA:  Breast cancer  EXAM: ULTRASOUND GUIDANCE FOR VASCULAR ACCESS  Left internal jugular PORT CATHETER INSERTION  Date:  07/29/2013 1:41 PM12/12/2012 1:41 PM  Radiologist:  Judie Petit. Ruel Favors, MD  Guidance:  Ultrasound and fluoroscopic  MEDICATIONS AND MEDICAL HISTORY: 2 g Ancef administered within 1 hr of the procedure, 4 mg bursae, 150 mcg fentanyl  ANESTHESIA/SEDATION: 90 min  CONTRAST:  None.  FLUOROSCOPY TIME:  4 min 12 seconds  PROCEDURE: Informed consent was obtained from the patient following explanation of the procedure, risks, benefits and alternatives. The patient understands,  agrees and consents for the procedure. All questions were addressed. A time out was performed.  Under sterile conditions and local anesthesia, left micropuncture venous access was performed with ultrasound. Images were obtained for documentation. A guide wire was inserted followed by a 4-French dilator. A catheter and guidewire were utilized to access the IVC.  Measurements were obtained from the SVC/RA junction back to the venotomy site. In the infra clavicular chest, a subcutaneous pocket was created under sterile conditions and local anesthesia. 1% lidocaine with epinephrine was utilized for this. A 2.5 cm incision was made the skin. Blunt dissection was performed to create a subcutaneous pocket. Hemostasis was obtained with electrocautery. The pocket was flushed with saline. The port catheter was assembled and checked for leakage. The port catheter was secured in the pocket with two retention sutures. The tubing was tunneled subcutaneously to the venotomy site and inserted into the SVC/RA junction through a valved peel-away sheath. Position was confirmed with fluoroscopy. No immediate complications. The patient tolerated the procedure well.  Incisions were closed with Vicryl suture and Dermabond. The port catheter was accessed, blood was aspirated followed by saline and heparin flushes. Needle was removed. A sterile dressing was applied.  COMPLICATIONS: No immediate  IMPRESSION: Successful left IJ single lumen power port catheter insertion. Tip SVC RA junction. Access ready for use.  Electronically Signed   By: Ruel Favors M.D.   On: 07/29/2013 13:44   Dg Chest Port 1 View  07/28/2013   CLINICAL DATA:  Failed left Port-A-Cath inflation.  EXAM: PORTABLE CHEST - 1 VIEW  COMPARISON:  Chest x-ray at July 27, 2013.  FINDINGS: The lung volumes are low. No pneumothorax is demonstrated. There is increased density that projects in the left lower lobe suggesting atelectasis or developing pneumonia. The cardiac  silhouette is less well-defined today. New increased density in the left AP window region has appeared since yesterday's study. A portion of this is related to the AP portable technique but a true change has appeared. The central pulmonary vascularity is prominent. There are transversely oriented skin staples over the right hemidiaphragmatic level. There is likely a subcutaneous surgical drain in place.There are stable surgical clips in the right axillary region.  IMPRESSION: 1. There is no definite evidence of a postprocedure pneumothorax. 2. There has been a dramatic change in the appearance of the mediastinum especially to the left of midline since yesterday's study. This likely reflects the development of atelectasis or pneumonia principally in the left lower lobe but the change in the left hilar region is unusual. Followup chest CT scanning is recommended. 3. There postsurgical changes on the right presumably from mastectomy.   Electronically Signed   By: David  [place]   On: 07/28/2013 14:11   Dg Fluoro Guide Cv Line-no Report  07/28/2013   CLINICAL DATA: port-a-cath placement   FLOURO GUIDE CV LINE  Fluoroscopy was utilized by the requesting physician.  No radiographic  interpretation.     Anti-infectives: Anti-infectives   Start     Dose/Rate Route Frequency Ordered Stop   07/29/13 0828  ceFAZolin (ANCEF) IVPB 2 g/50 mL premix     2 g 100 mL/hr over 30 Minutes Intravenous 60 min pre-op 07/29/13 1610 07/29/13 1156   07/28/13 1700  ceFAZolin (ANCEF) IVPB 1 g/50 mL premix     1 g 100 mL/hr over 30 Minutes Intravenous Every 6 hours 07/28/13 1617 07/29/13 0615   07/28/13 0600  ceFAZolin (ANCEF) IVPB 2 g/50 mL premix     2 g 100 mL/hr over 30 Minutes Intravenous On call to O.R. 07/27/13 1414 07/28/13 1108      Assessment/Plan: s/p Procedure(s): RIGHT TOTAL  MASTECTOMY (Right) ATTEMPTED INSERTION PORT-A-CATH (Left) Advance diet Plan for discharge tomorrow  LOS: 1 day    Coraline Talwar  K. 07/29/2013

## 2013-07-29 NOTE — Procedures (Signed)
LEFT IJ POWER PORT TIP SVC/RA NO COMP READY FOR USE FULL REPORT IN PACS

## 2013-07-29 NOTE — Progress Notes (Signed)
INITIAL NUTRITION ASSESSMENT  DOCUMENTATION CODES Per approved criteria  -Not Applicable   INTERVENTION: 1.  General healthful diet; encourage intake of foods and beverages as able.  RD to follow and assess for nutritional adequacy. Will consider supplements based on adequacy of intake.   NUTRITION DIAGNOSIS: Unintended wt loss related to acute illness as evidenced by pt report, 6 lbs reported loss.   Monitor:  1.  Food/Beverage; pt meeting >/=90% estimated needs with tolerance. 2.  Wt/wt change; monitor trends  Reason for Assessment: MST  67 y.o. female  Admitting Dx: recurrent right breast cancer.   ASSESSMENT: Pt admitted for right total mastectomy with Port-A-Cath placement.   Pt reported wt loss of 14 lbs on admission with decreased appetite. RD met with pt who reports she lost 6 lbs over the past 1 month due to sinus infection with poor appetite.   Pt states she normally eats well and has a good appetite.  She is hungry now. She has a Regular diet ordered for dinner.   Nutrition Focused Physical Exam:  Subcutaneous Fat:  Orbital Region: WNL Upper Arm Region: WNL Thoracic and Lumbar Region: WNL  Muscle:  Temple Region: WNL Clavicle Bone Region: WNL Clavicle and Acromion Bone Region: WNL Scapular Bone Region: WNL Dorsal Hand: WNL Patellar Region: WNL Anterior Thigh Region: WNL Posterior Calf Region: WNL  Edema: none present  Height: Ht Readings from Last 1 Encounters:  07/29/13 5\' 4"  (1.626 m)    Weight: Wt Readings from Last 1 Encounters:  07/29/13 179 lb 15.7 oz (81.64 kg)    Ideal Body Weight: 120 lbs  % Ideal Body Weight: 149%  Wt Readings from Last 10 Encounters:  07/29/13 179 lb 15.7 oz (81.64 kg)  07/29/13 179 lb 15.7 oz (81.64 kg)  07/27/13 180 lb (81.647 kg)  07/27/13 180 lb 11.2 oz (81.965 kg)  07/18/13 177 lb (80.287 kg)  07/04/13 183 lb (83.008 kg)  07/04/13 183 lb (83.008 kg)  06/29/13 188 lb 4.8 oz (85.412 kg)  06/17/13 183 lb  (83.008 kg)  06/17/13 183 lb (83.008 kg)    Usual Body Weight: 185 lbs  % Usual Body Weight: 96%  BMI:  Body mass index is 30.88 kg/(m^2).  Estimated Nutritional Needs: Kcal: 2040-2190 Protein: 90-105g Fluid: >2.0 L/day  Skin: incision  Diet Order: NPO  EDUCATION NEEDS: -No education needs identified at this time   Intake/Output Summary (Last 24 hours) at 07/29/13 1308 Last data filed at 07/29/13 0900  Gross per 24 hour  Intake 1622.5 ml  Output    175 ml  Net 1447.5 ml    Last BM: 12/4  Labs:   Recent Labs Lab 07/27/13 0929 07/28/13 1705 07/29/13 0644  NA 146*  --  142  K 3.8  --  3.8  CL  --   --  109  CO2 23  --  24  BUN 10.1  --  4*  CREATININE 0.8 0.67 0.71  CALCIUM 9.3  --  8.3*  GLUCOSE 100  --  98    CBG (last 3)  No results found for this basename: GLUCAP,  in the last 72 hours  Scheduled Meds: . amitriptyline  150 mg Oral QHS  . buPROPion  150 mg Oral QHS  . buPROPion  300 mg Oral Daily  . enoxaparin (LOVENOX) injection  40 mg Subcutaneous Q24H  . fentaNYL      . levothyroxine  50 mcg Oral QAC breakfast  . metoprolol succinate  25 mg Oral Daily  .  midazolam      . Oxcarbazepine  300 mg Oral BID  . Vilazodone HCl  1 tablet Oral Daily    Continuous Infusions: . 0.9 % NaCl with KCl 20 mEq / L 75 mL/hr at 07/29/13 4696    Past Medical History  Diagnosis Date  . Sinus tachycardia     mild resting  . HLD (hyperlipidemia)   . Orthostatic hypotension     slight in the past  . Anxiety   . Depression   . Diverticulosis   . Fibromyalgia 1978  . Hypothyroidism   . Alopecia   . Ruptured disk     one in neck and two in back  . Plantar fasciitis   . GERD (gastroesophageal reflux disease)   . History of syncope     episode 2008--  no recurrence since  . Ureteral calculi     bilateral  . Leukocytosis   . Chronic back pain     "lower back and upper neck" (07/28/2013)  . 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 ear   . Urgency of urination   . Hematuria    . Sensation of pressure in bladder area   . Wears glasses   . Ecchymosis     FROM IV AND LAB WORK THE PAST WEEK  06-17-2013  . Stool incontinence     "at times recently"   . Kidney stones 2014  . CAP (community acquired pneumonia)     admission 06-04-2013 secondary to failed oral antibitotics---  last cxr 06-16-2013  much improved  . SOB (shortness of breath) 06-17-2013 CURRENTLY RESIDUAL FROM RECENT CAP    SOB from obesity and deconditioning 2009- eval included CPX  . Arthritis     "spine" (07/28/2013)  . DDD (degenerative disc disease)   . CLL (chronic lymphocytic leukemia) dx'd 05/2013    "I don't think I have it though" (07/28/2013)  . Breast cancer 1997; 2014    right    Past Surgical History  Procedure Laterality Date  . Lumbar epidural injection      has had 7 injections  . Total abdominal hysterectomy w/ bilateral salpingoophorectomy  1997  . Breast lumpectomy with axillary lymph node dissection Right 1997  . Transthoracic echocardiogram  05-11-2007  dr Myrtis Ser    normal lvf/  ef 65%  . Tonsillectomy  AGE 35  . Retinal detachment surgery Right 2013  . Cystoscopy with retrograde pyelogram, ureteroscopy and stent placement Bilateral 06/17/2013    Procedure: CYSTOSCOPY WITH RETROGRADE PYELOGRAM, URETEROSCOPY AND LEFT DOUBLE  J STENT PLACEMENT RIGHT URETERAL HOLMIIUM LASER AND DOUBLE J STENT ;  Surgeon: Lindaann Slough, MD;  Location: [facility] [place];  Service: Urology;  Laterality: Bilateral;  . Holmium laser application Bilateral 06/17/2013    Procedure: HOLMIUM LASER APPLICATION;  Surgeon: Lindaann Slough, MD;  Location: [facility];  Service: Urology;  Laterality: Bilateral;  . Mastectomy w/ nodes partial Right 1997  . Mastectomy complete / simple Right 07/28/2013  . Appendectomy  1997  . Breast biopsy Right 2014  . Cystoscopy with ureteroscopy, stone basketry and stent placement Bilateral 2014  . Lithotripsy Left ~ 06/2013    Loyce Dys,  MS RD LDN Clinical Inpatient Dietitian Pager: [phone] Weekend/After hours pager: [phone]

## 2013-07-29 NOTE — ED Notes (Signed)
Procedure canceled at this time by MD. Pt has to have a CT of her chest prior to any other procedures. New order in computer. Ct will be done before pt comes back to this room.

## 2013-07-30 LAB — CBC
HCT: 29.6 % — ABNORMAL LOW (ref 36.0–46.0)
Hemoglobin: 9.2 g/dL — ABNORMAL LOW (ref 12.0–15.0)
MCH: 28 pg (ref 26.0–34.0)
MCV: 90.2 fL (ref 78.0–100.0)
Platelets: 137 10*3/uL — ABNORMAL LOW (ref 150–400)
RBC: 3.28 MIL/uL — ABNORMAL LOW (ref 3.87–5.11)
WBC: 9.3 10*3/uL (ref 4.0–10.5)

## 2013-07-30 LAB — BASIC METABOLIC PANEL
BUN: 6 mg/dL (ref 6–23)
CO2: 25 mEq/L (ref 19–32)
GFR calc non Af Amer: 85 mL/min — ABNORMAL LOW (ref >90)
Glucose, Bld: 101 mg/dL — ABNORMAL HIGH (ref 70–99)
Potassium: 3.9 mEq/L (ref 3.5–5.1)
Sodium: 145 mEq/L (ref 135–145)

## 2013-07-30 MED ORDER — OXYCODONE-ACETAMINOPHEN 5-325 MG PO TABS: 1.0000 | ORAL_TABLET | ORAL | Status: DC | PRN

## 2013-07-30 NOTE — Progress Notes (Signed)
Drain teaching completed with return demo from pt. Discharged home accompanied by spouse.[name] [name]

## 2013-07-30 NOTE — Progress Notes (Signed)
2 Days Post-Op  Subjective: Tolerating diet.  Pain controlled  Objective: Vital signs in last 24 hours: Temp:  [98.1 F (36.7 C)-98.7 F (37.1 C)] 98.1 F (36.7 C) (12/06 0525) Pulse Rate:  [78-109] 78 (12/06 0525) Resp:  [12-29] 20 (12/06 0525) BP: (103-139)/(49-74) 103/61 mmHg (12/06 0525) SpO2:  [87 %-100 %] 98 % (12/06 0525) Last BM Date: 07/28/13  Intake/Output from previous day: 12/05 0701 - 12/06 0700 In: 1190 [P.O.:750; I.V.:440] Out: 1140 [Urine:1050; Drains:90] Intake/Output this shift: Total I/O In: -  Out: 15 [Drains:15]  General appearance: alert, cooperative and no distress Resp: nonlabored Breasts: right breast incision looks fine.  slight epidermolysis at skin edge but doesnt extende beyond width of staples, flaps viable, no sign of infection, JP output ss and thin but not holding suction for very long Cardio: normal rate, regular  Lab Results:   Recent Labs  07/29/13 0644 07/30/13 0422  WBC 9.2 9.3  HGB 9.9* 9.2*  HCT 29.9* 29.6*  PLT 129* 137*   BMET  Recent Labs  07/29/13 0644 07/30/13 0422  NA 142 145  K 3.8 3.9  CL 109 111  CO2 24 25  GLUCOSE 98 101*  BUN 4* 6  CREATININE 0.71 0.76  CALCIUM 8.3* 8.3*   PT/INR  Recent Labs  07/29/13 0644  LABPROT 12.4  INR 0.94   ABG No results found for this basename: PHART, PCO2, PO2, HCO3,  in the last 72 hours  Studies/Results: Ct Chest W Contrast  07/29/2013   CLINICAL DATA:  Postop right mastectomy, attempted left subclavian port catheter, abnormal chest x-ray with mediastinal widening  EXAM: CT CHEST WITH CONTRAST  TECHNIQUE: Multidetector CT imaging of the chest was performed during intravenous contrast administration.  CONTRAST:  OMNIPAQUE IOHEXOL 300 MG/ML  SOLN  COMPARISON:  07/28/2013 chest x-rays  FINDINGS: Postop changes from recent right mastectomy and axillary lymph node dissection. Surgical drain in the right axilla extending over the anterior chest. Scattered air  throughout the surgical site. Overlying skin staples noted. No mediastinal abnormality or hemorrhage. Intact thoracic aorta. Innominate veins and SVC are patent. Normal heart size and vascularity. No pericardial or pleural effusion. No significant hiatal hernia. Negative for adenopathy.  Lung windows demonstrate dependent bibasilar atelectasis. No pneumothorax, pulmonary hemorrhage, contusion, or focal airspace process. Trachea and central airways are patent. Additional peripheral subsegmental atelectasis in the inferior right upper lobe along the right fissure.  Included upper abdomen demonstrates some fatty replacement of the pancreas proximally. No acute upper abdominal findings. Atherosclerotic changes of the abdominal vasculature.  IMPRESSION: Negative for pneumothorax or pleural effusion.  Low volume chest exam with atelectasis.  No mediastinal abnormality or hemorrhage  Postop changes from recent right mastectomy   Electronically Signed   By: Ruel Favors M.D.   On: 07/29/2013 10:29   Ir Fluoro Guide Cv Line Left  07/29/2013   CLINICAL DATA:  Breast cancer  EXAM: ULTRASOUND GUIDANCE FOR VASCULAR ACCESS  Left internal jugular PORT CATHETER INSERTION  Date:  07/29/2013 1:41 PM12/12/2012 1:41 PM  Radiologist:  Judie Petit. Ruel Favors, MD  Guidance:  Ultrasound and fluoroscopic  MEDICATIONS AND MEDICAL HISTORY: 2 g Ancef administered within 1 hr of the procedure, 4 mg bursae, 150 mcg fentanyl  ANESTHESIA/SEDATION: 90 min  CONTRAST:  None.  FLUOROSCOPY TIME:  4 min 12 seconds  PROCEDURE: Informed consent was obtained from the patient following explanation of the procedure, risks, benefits and alternatives. The patient understands, agrees and consents for the procedure. All questions  were addressed. A time out was performed.  Under sterile conditions and local anesthesia, left micropuncture venous access was performed with ultrasound. Images were obtained for documentation. A guide wire was inserted followed by a  4-French dilator. A catheter and guidewire were utilized to access the IVC.  Measurements were obtained from the SVC/RA junction back to the venotomy site. In the infra clavicular chest, a subcutaneous pocket was created under sterile conditions and local anesthesia. 1% lidocaine with epinephrine was utilized for this. A 2.5 cm incision was made the skin. Blunt dissection was performed to create a subcutaneous pocket. Hemostasis was obtained with electrocautery. The pocket was flushed with saline. The port catheter was assembled and checked for leakage. The port catheter was secured in the pocket with two retention sutures. The tubing was tunneled subcutaneously to the venotomy site and inserted into the SVC/RA junction through a valved peel-away sheath. Position was confirmed with fluoroscopy. No immediate complications. The patient tolerated the procedure well.  Incisions were closed with Vicryl suture and Dermabond. The port catheter was accessed, blood was aspirated followed by saline and heparin flushes. Needle was removed. A sterile dressing was applied.  COMPLICATIONS: No immediate  IMPRESSION: Successful left IJ single lumen power port catheter insertion. Tip SVC RA junction. Access ready for use.   Electronically Signed   By: Ruel Favors M.D.   On: 07/29/2013 13:44   Ir US Guide Vasc Access Left  07/29/2013   CLINICAL DATA:  Breast cancer  EXAM: ULTRASOUND GUIDANCE FOR VASCULAR ACCESS  Left internal jugular PORT CATHETER INSERTION  Date:  07/29/2013 1:41 PM12/12/2012 1:41 PM  Radiologist:  Judie Petit. Ruel Favors, MD  Guidance:  Ultrasound and fluoroscopic  MEDICATIONS AND MEDICAL HISTORY: 2 g Ancef administered within 1 hr of the procedure, 4 mg bursae, 150 mcg fentanyl  ANESTHESIA/SEDATION: 90 min  CONTRAST:  None.  FLUOROSCOPY TIME:  4 min 12 seconds  PROCEDURE: Informed consent was obtained from the patient following explanation of the procedure, risks, benefits and alternatives. The patient understands,  agrees and consents for the procedure. All questions were addressed. A time out was performed.  Under sterile conditions and local anesthesia, left micropuncture venous access was performed with ultrasound. Images were obtained for documentation. A guide wire was inserted followed by a 4-French dilator. A catheter and guidewire were utilized to access the IVC.  Measurements were obtained from the SVC/RA junction back to the venotomy site. In the infra clavicular chest, a subcutaneous pocket was created under sterile conditions and local anesthesia. 1% lidocaine with epinephrine was utilized for this. A 2.5 cm incision was made the skin. Blunt dissection was performed to create a subcutaneous pocket. Hemostasis was obtained with electrocautery. The pocket was flushed with saline. The port catheter was assembled and checked for leakage. The port catheter was secured in the pocket with two retention sutures. The tubing was tunneled subcutaneously to the venotomy site and inserted into the SVC/RA junction through a valved peel-away sheath. Position was confirmed with fluoroscopy. No immediate complications. The patient tolerated the procedure well.  Incisions were closed with Vicryl suture and Dermabond. The port catheter was accessed, blood was aspirated followed by saline and heparin flushes. Needle was removed. A sterile dressing was applied.  COMPLICATIONS: No immediate  IMPRESSION: Successful left IJ single lumen power port catheter insertion. Tip SVC RA junction. Access ready for use.   Electronically Signed   By: Ruel Favors M.D.   On: 07/29/2013 13:44   Dg Chest Port 1  View  07/28/2013   CLINICAL DATA:  Failed left Port-A-Cath inflation.  EXAM: PORTABLE CHEST - 1 VIEW  COMPARISON:  Chest x-ray at July 27, 2013.  FINDINGS: The lung volumes are low. No pneumothorax is demonstrated. There is increased density that projects in the left lower lobe suggesting atelectasis or developing pneumonia. The cardiac  silhouette is less well-defined today. New increased density in the left AP window region has appeared since yesterday's study. A portion of this is related to the AP portable technique but a true change has appeared. The central pulmonary vascularity is prominent. There are transversely oriented skin staples over the right hemidiaphragmatic level. There is likely a subcutaneous surgical drain in place.There are stable surgical clips in the right axillary region.  IMPRESSION: 1. There is no definite evidence of a postprocedure pneumothorax. 2. There has been a dramatic change in the appearance of the mediastinum especially to the left of midline since yesterday's study. This likely reflects the development of atelectasis or pneumonia principally in the left lower lobe but the change in the left hilar region is unusual. Followup chest CT scanning is recommended. 3. There postsurgical changes on the right presumably from mastectomy.   Electronically Signed   By: David  [place]   On: 07/28/2013 14:11   Dg Fluoro Guide Cv Line-no Report  07/28/2013   CLINICAL DATA: port-a-cath placement   FLOURO GUIDE CV LINE  Fluoroscopy was utilized by the requesting physician.  No radiographic  interpretation.     Anti-infectives: Anti-infectives   Start     Dose/Rate Route Frequency Ordered Stop   07/29/13 0828  ceFAZolin (ANCEF) IVPB 2 g/50 mL premix     2 g 100 mL/hr over 30 Minutes Intravenous 60 min pre-op 07/29/13 1610 07/29/13 1156   07/28/13 1700  ceFAZolin (ANCEF) IVPB 1 g/50 mL premix     1 g 100 mL/hr over 30 Minutes Intravenous Every 6 hours 07/28/13 1617 07/29/13 0615   07/28/13 0600  ceFAZolin (ANCEF) IVPB 2 g/50 mL premix     2 g 100 mL/hr over 30 Minutes Intravenous On call to O.R. 07/27/13 1414 07/28/13 1108      Assessment/Plan: s/p Procedure(s): RIGHT TOTAL  MASTECTOMY (Right) ATTEMPTED INSERTION PORT-A-CATH (Left) she looks and feels fine.  Her wounds look great.  Her JP is not holding  suction for very long but output looks good.  Dressing was changed and She should be okay for discharge to home today.  Drain teaching and follow up on Monday   LOS: 2 days    Lodema Pilot DAVID 07/30/2013

## 2013-08-01 ENCOUNTER — Ambulatory Visit (INDEPENDENT_AMBULATORY_CARE_PROVIDER_SITE_OTHER): Payer: Medicare Other | Admitting: Surgery

## 2013-08-01 ENCOUNTER — Telehealth (HOSPITAL_COMMUNITY): Payer: Self-pay | Admitting: *Deleted

## 2013-08-01 ENCOUNTER — Encounter (INDEPENDENT_AMBULATORY_CARE_PROVIDER_SITE_OTHER): Payer: Self-pay | Admitting: Surgery

## 2013-08-01 ENCOUNTER — Encounter (INDEPENDENT_AMBULATORY_CARE_PROVIDER_SITE_OTHER): Payer: Self-pay

## 2013-08-01 VITALS — BP 122/80 | HR 79 | Temp 98.6°F | Resp 16 | Ht 64.0 in | Wt 183.8 lb

## 2013-08-01 DIAGNOSIS — C50511 Malignant neoplasm of lower-outer quadrant of right female breast: Secondary | ICD-10-CM

## 2013-08-01 DIAGNOSIS — C50919 Malignant neoplasm of unspecified site of unspecified female breast: Secondary | ICD-10-CM

## 2013-08-01 DIAGNOSIS — C50519 Malignant neoplasm of lower-outer quadrant of unspecified female breast: Secondary | ICD-10-CM

## 2013-08-01 DIAGNOSIS — C50911 Malignant neoplasm of unspecified site of right female breast: Secondary | ICD-10-CM

## 2013-08-01 NOTE — Progress Notes (Signed)
Status post right mastectomy on 07/28/13. Status post left subclavian vein port placement on 07/29/13. The patient was discharged home on 12/6. The pathology report is not yet available. The patient is having trouble keeping suction on the drain. She is having leaking of clear seroma fluid around the drain site in the axilla. We went ahead and removed the drain without difficulty. The staple line is intact and the skin flaps are viable with minimal bruising. No signs of necrosis. The port site on the left is sealed with Dermabond and has some bruising but no significant hematoma. The patient should continue with a dry dressing over the drain site as well as over the staple line. We will recheck her in 4 days for probable staple removal. At that point we will discuss her pathology report.  Wilmon Arms. Corliss Skains, MD, [facility] Surgery  General/ Trauma Surgery  08/01/2013 3:34 PM

## 2013-08-01 NOTE — Telephone Encounter (Signed)
Post procedure follow up call.  Spoke with pt. Says shes having some problems with bulb drain not staying fully charged.  Pt scheduled to follow up with general surgeon today.  She will follow up with [place] as needed

## 2013-08-02 ENCOUNTER — Other Ambulatory Visit: Payer: Self-pay | Admitting: Oncology

## 2013-08-03 ENCOUNTER — Encounter: Payer: Self-pay | Admitting: Obstetrics & Gynecology

## 2013-08-05 ENCOUNTER — Ambulatory Visit (INDEPENDENT_AMBULATORY_CARE_PROVIDER_SITE_OTHER): Payer: Medicare Other | Admitting: Surgery

## 2013-08-05 ENCOUNTER — Telehealth: Payer: Self-pay | Admitting: *Deleted

## 2013-08-05 ENCOUNTER — Encounter (INDEPENDENT_AMBULATORY_CARE_PROVIDER_SITE_OTHER): Payer: Self-pay | Admitting: Surgery

## 2013-08-05 ENCOUNTER — Encounter (INDEPENDENT_AMBULATORY_CARE_PROVIDER_SITE_OTHER): Payer: Self-pay

## 2013-08-05 VITALS — BP 128/78 | HR 72 | Temp 98.0°F | Resp 18 | Ht 63.0 in | Wt 179.0 lb

## 2013-08-05 DIAGNOSIS — C50519 Malignant neoplasm of lower-outer quadrant of unspecified female breast: Secondary | ICD-10-CM

## 2013-08-05 DIAGNOSIS — C50511 Malignant neoplasm of lower-outer quadrant of right female breast: Secondary | ICD-10-CM

## 2013-08-05 NOTE — Progress Notes (Signed)
She comes in today for wound check. Unfortunately her drain did not function and we had to remove it earlier in the week. She has developed a fairly large seroma laterally under her mastectomy site heading into her axilla. The previous drain site is sealed. There is no leaking out onto the skin. There is some slight pink erythema to the skin over the seroma. The staple line itself is intact with no drainage. We anesthetized the skin with 1% lidocaine and aspirated 480 cc of serosanguineous fluid. The patient very likely may recur her seroma.  We will see her next week to recheck the wound and possibly remove her staples.  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  The tumor comes right up to the posterior surface of the dermis in the right lower lateral part of the skin flap, with a margin of 1 mm.  A single benign lymph node was found in the specimen.  She has an appointment with Dr. Darnelle Catalan in a few weeks to discuss possible chemotherapy.  Recheck on Tuesday.  I will discuss this path report with her daughter by phone.  Wilmon Arms. Corliss Skains, MD, [facility] Surgery  General/ Trauma Surgery  08/05/2013 10:47 AM

## 2013-08-05 NOTE — Telephone Encounter (Signed)
Sent Rx to [facility] for post mastectomy bras

## 2013-08-05 NOTE — Discharge Summary (Signed)
Physician Discharge Summary  Patient ID: [name] [name] MRN: [id number] DOB/AGE: [id number] y.o.  Admit date: 07/28/2013 Discharge date: 07/30/13   Admission Diagnoses: Recurrent right breast cancer   Discharge Diagnoses: Same Active Problems:   Recurrent breast cancer   Discharged Condition: good  Hospital Course: Right mastectomy 07/28/13 and left subclavian vein port placement 07/29/13.  Did well with pain control and is ready for discharge on 07/30/13.    Consults: Radiology for port placement  Significant Diagnostic Studies: none  Treatments: surgery: right mastectomy and port placement  Discharge Exam: Blood pressure 103/61, pulse 78, temperature 98.1 F (36.7 C), temperature source Oral, resp. rate 20, height 5\' 4"  (1.626 m), weight 179 lb 15.7 oz (81.64 kg), last menstrual period 08/26/1995, SpO2 98.00%. Incision/Wound: Incision c/d/i with viable skin flap; port site OK  Disposition: 01-Home or Self Care  Discharge Orders   Future Appointments Provider Department Dept Phone   08/09/2013 2:30 PM Wilmon Arms. Corliss Skains, MD [facility] Surgery, [place] [phone]   08/23/2013 10:00 AM [facility] [place] [facility] ULTRASOUND [phone]   08/23/2013 12:00 PM [facility] MEDICAL ONCOLOGY [phone]   08/29/2013 10:50 AM Wilmon Arms. Corliss Skains, MD [facility] Surgery, [place] [phone]   09/02/2013 8:30 AM [facility] MEDICAL ONCOLOGY [phone]   09/02/2013 9:00 AM Lowella Dell, MD [facility] MEDICAL ONCOLOGY [phone]   04/17/2014 2:45 PM Annamaria Boots, MD [facility] [phone]   Future Orders Complete By Expires   Call MD for:  difficulty breathing, headache or visual disturbances  As directed    Call MD for:  persistant nausea and vomiting  As directed    Call MD for:  redness, tenderness, or signs of infection (pain, swelling, redness,  odor or green/yellow discharge around incision site)  As directed    Call MD for:  severe uncontrolled pain  As directed    Call MD for:  temperature >100.4  As directed    Diet - low sodium heart healthy  As directed    Discharge instructions  As directed    Comments:     Strip and record drain output every 4 hours and as needed Recharge drain every 1 hour Sponge bath around the drain. Change dressing every day with clean dry gauze and as needed Call [phone] to schedule follow up visit with Dr. Corliss Skains on Monday   Increase activity slowly  As directed        Medication List    STOP taking these medications       oxyCODONE-acetaminophen 10-325 MG per tablet  Commonly known as:  PERCOCET  Replaced by:  oxyCODONE-acetaminophen 5-325 MG per tablet      TAKE these medications       ALPRAZolam 0.5 MG tablet  Commonly known as:  XANAX  Take 0.5 mg by mouth every 8 (eight) hours as needed for anxiety.     amitriptyline 150 MG tablet  Commonly known as:  ELAVIL  Take 150 mg by mouth at bedtime.     buPROPion 150 MG 12 hr tablet  Commonly known as:  WELLBUTRIN SR  Take 150-300 mg by mouth 2 (two) times daily. Take 300mg  in the morning and 150mg  at 2pm     cetirizine 10 MG tablet  Commonly known as:  ZYRTEC  Take 10 mg by mouth daily.     clindamycin 300 MG capsule  Commonly known as:  CLEOCIN  Take 300 mg by mouth 3 (three) times daily.     diphenhydrAMINE 25 MG tablet  Commonly known as:  BENADRYL  Take 25 mg by mouth every 6 (six) hours as needed for itching.     FISH OIL PO  Take 1 capsule by mouth daily.     gabapentin 300 MG capsule  Commonly known as:  NEURONTIN  Take 300 mg by mouth daily as needed (sleep). 300mg  with evening meal, 300mg  at bedtime     HAIR VITAMINS PO  Take 1 tablet by mouth 2 (two) times daily.     ibuprofen 200 MG tablet  Commonly known as:  ADVIL,MOTRIN  Take 800 mg by mouth every 6 (six) hours as needed for moderate pain.      levothyroxine 50 MCG tablet  Commonly known as:  SYNTHROID, LEVOTHROID  Take 50 mcg by mouth daily before breakfast.     metoprolol succinate 25 MG 24 hr tablet  Commonly known as:  TOPROL-XL  Take 25 mg by mouth daily.     omeprazole 20 MG capsule  Commonly known as:  PRILOSEC  Take 20 mg by mouth every morning.     Oxcarbazepine 300 MG tablet  Commonly known as:  TRILEPTAL  Take 300 mg by mouth 2 (two) times daily.     oxyCODONE-acetaminophen 5-325 MG per tablet  Commonly known as:  PERCOCET/ROXICET  Take 1 tablet by mouth every 4 (four) hours as needed for moderate pain.     ROGAINE 2 % external solution  Generic drug:  minoxidil  Apply 1 application topically 2 (two) times daily as needed.     VIIBRYD 20 MG Tabs  Generic drug:  Vilazodone HCl  Take 1 tablet by mouth daily.     vitamin B-12 1000 MCG tablet  Commonly known as:  CYANOCOBALAMIN  Take 1,000 mcg by mouth daily.     vitamin C 1000 MG tablet  Take 1,000 mg by mouth daily.     Vitamin D-3 1000 UNITS Caps  Take 5,000 Units by mouth daily.           Follow-up Information   Follow up with Wynona Luna., MD In 1 week.   Specialty:  General Surgery   Contact information:   [address] [place] [address] [phone]       Signed: Wynona Luna. 08/05/2013, 5:30 PM

## 2013-08-09 ENCOUNTER — Encounter (INDEPENDENT_AMBULATORY_CARE_PROVIDER_SITE_OTHER): Payer: Self-pay | Admitting: Surgery

## 2013-08-09 ENCOUNTER — Ambulatory Visit (INDEPENDENT_AMBULATORY_CARE_PROVIDER_SITE_OTHER): Payer: Medicare Other | Admitting: Surgery

## 2013-08-09 VITALS — BP 123/77 | HR 68 | Temp 97.7°F | Resp 14 | Ht 64.0 in | Wt 184.6 lb

## 2013-08-09 DIAGNOSIS — C50511 Malignant neoplasm of lower-outer quadrant of right female breast: Secondary | ICD-10-CM

## 2013-08-09 DIAGNOSIS — C50519 Malignant neoplasm of lower-outer quadrant of unspecified female breast: Secondary | ICD-10-CM

## 2013-08-09 MED ORDER — CEPHALEXIN 250 MG PO CAPS: 250.0000 mg | ORAL_CAPSULE | Freq: Four times a day (QID) | ORAL | Status: DC

## 2013-08-09 NOTE — Progress Notes (Signed)
She comes in today for wound check. She has reaccumulated a fairly large seroma laterally under her mastectomy site heading into her axilla. The previous drain site is sealed. There is no leaking out onto the skin. There is some slight pink erythema to the skin over the seroma. The staple line itself is intact with no drainage. We anesthetized the skin with 1% lidocaine and aspirated 510 cc of serosanguineous fluid. The staples are removed and replaced with steri-strips.  The drain site and the port site are fine.    We will empirically treat her with a course of Keflex for the erythema over the wound.  Recheck 3 days.  Wilmon Arms. Corliss Skains, MD, [facility] Surgery  General/ Trauma Surgery  08/09/2013 5:20 PM

## 2013-08-11 ENCOUNTER — Other Ambulatory Visit: Payer: Self-pay | Admitting: Family Medicine

## 2013-08-12 ENCOUNTER — Encounter (INDEPENDENT_AMBULATORY_CARE_PROVIDER_SITE_OTHER): Payer: Self-pay | Admitting: Surgery

## 2013-08-12 ENCOUNTER — Ambulatory Visit (INDEPENDENT_AMBULATORY_CARE_PROVIDER_SITE_OTHER): Payer: Medicare Other | Admitting: Surgery

## 2013-08-12 VITALS — BP 133/80 | HR 80 | Temp 98.6°F | Resp 18 | Ht 64.0 in | Wt 183.8 lb

## 2013-08-12 DIAGNOSIS — C50511 Malignant neoplasm of lower-outer quadrant of right female breast: Secondary | ICD-10-CM

## 2013-08-12 DIAGNOSIS — C50519 Malignant neoplasm of lower-outer quadrant of unspecified female breast: Secondary | ICD-10-CM

## 2013-08-12 DIAGNOSIS — IMO0002 Reserved for concepts with insufficient information to code with codable children: Secondary | ICD-10-CM | POA: Insufficient documentation

## 2013-08-12 DIAGNOSIS — Z5189 Encounter for other specified aftercare: Secondary | ICD-10-CM

## 2013-08-12 DIAGNOSIS — IMO0001 Reserved for inherently not codable concepts without codable children: Secondary | ICD-10-CM

## 2013-08-12 NOTE — Progress Notes (Signed)
The patient has recurred first aroma. The skin overlying the stroma looks very tight and erythematous. I decided to place a new drain. We prepped with Betadine and anesthetized with 1% lidocaine. I made a small stab incision and inserted a round Jackson-Pratt drain. We placed this to bulb suction. We aspirated over 300 cc of serosanguineous fluid. This did not look purulent. The skin erythema faded as the stroma decrease. The patient has some skin blistering from the Steri-Strips as we removed all the Steri-Strips. The drain was secured with a 2-0 nylon suture as well as a separate pursestring suture of 2-0 nylon. We will recheck her in four days.  Wilmon Arms. Corliss Skains, MD, [facility] Surgery  General/ Trauma Surgery  08/12/2013 11:44 AM

## 2013-08-16 ENCOUNTER — Encounter (INDEPENDENT_AMBULATORY_CARE_PROVIDER_SITE_OTHER): Payer: Self-pay | Admitting: Surgery

## 2013-08-16 ENCOUNTER — Ambulatory Visit (INDEPENDENT_AMBULATORY_CARE_PROVIDER_SITE_OTHER): Payer: Medicare Other | Admitting: Surgery

## 2013-08-16 VITALS — BP 133/88 | HR 77 | Temp 98.1°F | Resp 18 | Ht 64.0 in | Wt 181.0 lb

## 2013-08-16 DIAGNOSIS — Z5189 Encounter for other specified aftercare: Secondary | ICD-10-CM

## 2013-08-16 DIAGNOSIS — C50519 Malignant neoplasm of lower-outer quadrant of unspecified female breast: Secondary | ICD-10-CM

## 2013-08-16 DIAGNOSIS — C50511 Malignant neoplasm of lower-outer quadrant of right female breast: Secondary | ICD-10-CM

## 2013-08-16 DIAGNOSIS — IMO0001 Reserved for inherently not codable concepts without codable children: Secondary | ICD-10-CM

## 2013-08-16 NOTE — Progress Notes (Signed)
The patient is doing much better with the drain in. Last night she started to develop some leakage around the drain site. He seems to have pulled out slightly and is no longer keeping a seal. However there is no seroma that has accumulated under the skin flaps. The skin flaps appeared normal in color seem to be scarred down to the chest wall. She has some skin blisters from the Steri-Strips. Overall there is no sign of infection. I removed the drain and placed a dry dressing. Followup one week.  Wilmon Arms. Corliss Skains, MD, [facility] Surgery  General/ Trauma Surgery  08/16/2013 9:56 AM

## 2013-08-17 ENCOUNTER — Encounter (INDEPENDENT_AMBULATORY_CARE_PROVIDER_SITE_OTHER): Payer: Self-pay

## 2013-08-23 ENCOUNTER — Ambulatory Visit (INDEPENDENT_AMBULATORY_CARE_PROVIDER_SITE_OTHER): Payer: Medicare Other | Admitting: Surgery

## 2013-08-23 ENCOUNTER — Ambulatory Visit (HOSPITAL_COMMUNITY)
Admission: RE | Admit: 2013-08-23 | Discharge: 2013-08-23 | Disposition: A | Discharge status: Home / Self Care | Payer: Medicare Other | Source: Ambulatory Visit | Attending: Oncology | Admitting: Oncology

## 2013-08-23 ENCOUNTER — Encounter: Payer: Self-pay | Admitting: *Deleted

## 2013-08-23 ENCOUNTER — Encounter (INDEPENDENT_AMBULATORY_CARE_PROVIDER_SITE_OTHER): Payer: Self-pay | Admitting: Surgery

## 2013-08-23 ENCOUNTER — Other Ambulatory Visit: Payer: Medicare Other

## 2013-08-23 ENCOUNTER — Encounter (INDEPENDENT_AMBULATORY_CARE_PROVIDER_SITE_OTHER): Payer: Medicare Other | Admitting: Surgery

## 2013-08-23 VITALS — BP 124/76 | HR 76 | Temp 98.1°F | Resp 14 | Ht 64.0 in | Wt 184.8 lb

## 2013-08-23 DIAGNOSIS — Z01818 Encounter for other preprocedural examination: Secondary | ICD-10-CM | POA: Insufficient documentation

## 2013-08-23 DIAGNOSIS — Z0189 Encounter for other specified special examinations: Secondary | ICD-10-CM

## 2013-08-23 DIAGNOSIS — C50519 Malignant neoplasm of lower-outer quadrant of unspecified female breast: Secondary | ICD-10-CM

## 2013-08-23 DIAGNOSIS — C50911 Malignant neoplasm of unspecified site of right female breast: Secondary | ICD-10-CM

## 2013-08-23 DIAGNOSIS — C50511 Malignant neoplasm of lower-outer quadrant of right female breast: Secondary | ICD-10-CM

## 2013-08-23 DIAGNOSIS — C50919 Malignant neoplasm of unspecified site of unspecified female breast: Secondary | ICD-10-CM | POA: Insufficient documentation

## 2013-08-23 NOTE — Progress Notes (Signed)
Echocardiogram 2D Echocardiogram has been performed.  Hakeen Shipes 08/23/2013, 12:22 PM

## 2013-08-23 NOTE — Progress Notes (Signed)
The patient comes in today for another wound check. She attended chemotherapy class this morning. She also had an echocardiogram earlier today which was normal. She has recurred some of her seroma on the medial half of her wound. She has some slight drainage from the old drain site. We prepped the skin with Betadine and aspirated 150 cc of straw-colored fluid. There seemed to be complete resolution of the seroma. The lateral half of the incision seems to be firming up of scar tissue. I encouraged her again to continue keeping a dressing over the drain site and to keep her breast binder on at all times except when she is showering. We will recheck her on Monday.  Wilmon Arms. Corliss Skains, MD, [facility] Surgery  General/ Trauma Surgery  08/23/2013 4:44 PM

## 2013-08-26 ENCOUNTER — Telehealth: Payer: Self-pay | Admitting: Family Medicine

## 2013-08-26 MED ORDER — AZITHROMYCIN 250 MG PO TABS: ORAL_TABLET | ORAL | Status: DC

## 2013-08-26 NOTE — Telephone Encounter (Signed)
Most likely viral.  However, if she has fever I would start z-pack.

## 2013-08-26 NOTE — Telephone Encounter (Signed)
Spoke to patient.  Told her provider feels it may be viral.  Sent Rx to pharmacy if gets fever over week end.

## 2013-08-26 NOTE — Telephone Encounter (Signed)
Sick since 3 days.  Congestion mostly in head and sinuses.  Dark yellow, greenish secretion.  S/P mastectomy.  Just came off Cephalexin for breast infection.  Suppose to start cancer treatment next week.  Can you call something for her?

## 2013-08-29 ENCOUNTER — Ambulatory Visit (INDEPENDENT_AMBULATORY_CARE_PROVIDER_SITE_OTHER): Payer: Medicare Other | Admitting: Surgery

## 2013-08-29 ENCOUNTER — Other Ambulatory Visit: Payer: Self-pay | Admitting: Oncology

## 2013-08-29 ENCOUNTER — Encounter (INDEPENDENT_AMBULATORY_CARE_PROVIDER_SITE_OTHER): Payer: Self-pay | Admitting: Surgery

## 2013-08-29 VITALS — BP 132/80 | HR 82 | Temp 98.0°F | Resp 18 | Ht 64.0 in | Wt 180.0 lb

## 2013-08-29 DIAGNOSIS — C50519 Malignant neoplasm of lower-outer quadrant of unspecified female breast: Secondary | ICD-10-CM

## 2013-08-29 DIAGNOSIS — C50511 Malignant neoplasm of lower-outer quadrant of right female breast: Secondary | ICD-10-CM

## 2013-08-29 NOTE — Progress Notes (Signed)
Status post right mastectomy on 07/28/13. She has had repeated aspirations of seromatous but it seems that she has not recurred any further seromatous as last week. Her wound is now starting to contract and scarred. No sign of infection. The drain site is healing well. She has an appointment with oncology on 09/02/13.  We will see her back in 3 months or sooner as needed.  Imogene Burn. Georgette Dover, MD, [facility] Surgery  General/ Trauma Surgery  08/29/2013 11:09 AM

## 2013-08-29 NOTE — Progress Notes (Unsigned)
The question [place] margins were discussed at breast conference 08/10/2013. It was felt that all that is left at the focally positive inferior margin is dermis. She would need a skin graft if that were to be cleared. She may need postmastectomy radiation however, possibly electrons, as she may be at increased risk of local recurrence.   She has a port in place and chemotherapy is planned once she recovers from the multiple wound problems she has been experiencing.

## 2013-08-30 ENCOUNTER — Other Ambulatory Visit: Payer: Medicare Other

## 2013-08-30 ENCOUNTER — Ambulatory Visit: Payer: Medicare Other | Admitting: Oncology

## 2013-09-02 ENCOUNTER — Telehealth: Payer: Self-pay | Admitting: Oncology

## 2013-09-02 ENCOUNTER — Ambulatory Visit (HOSPITAL_BASED_OUTPATIENT_CLINIC_OR_DEPARTMENT_OTHER): Payer: Medicare Other | Admitting: Oncology

## 2013-09-02 ENCOUNTER — Telehealth: Payer: Self-pay | Admitting: *Deleted

## 2013-09-02 ENCOUNTER — Other Ambulatory Visit: Payer: Self-pay | Admitting: *Deleted

## 2013-09-02 ENCOUNTER — Other Ambulatory Visit (HOSPITAL_BASED_OUTPATIENT_CLINIC_OR_DEPARTMENT_OTHER): Payer: Medicare Other

## 2013-09-02 VITALS — BP 143/81 | HR 97 | Temp 98.5°F | Resp 18 | Ht 64.0 in | Wt 181.1 lb

## 2013-09-02 DIAGNOSIS — D72829 Elevated white blood cell count, unspecified: Secondary | ICD-10-CM

## 2013-09-02 DIAGNOSIS — D649 Anemia, unspecified: Secondary | ICD-10-CM

## 2013-09-02 DIAGNOSIS — C50911 Malignant neoplasm of unspecified site of right female breast: Secondary | ICD-10-CM

## 2013-09-02 DIAGNOSIS — C50519 Malignant neoplasm of lower-outer quadrant of unspecified female breast: Secondary | ICD-10-CM

## 2013-09-02 DIAGNOSIS — C9110 Chronic lymphocytic leukemia of B-cell type not having achieved remission: Secondary | ICD-10-CM

## 2013-09-02 DIAGNOSIS — Z853 Personal history of malignant neoplasm of breast: Secondary | ICD-10-CM

## 2013-09-02 DIAGNOSIS — C50511 Malignant neoplasm of lower-outer quadrant of right female breast: Secondary | ICD-10-CM

## 2013-09-02 DIAGNOSIS — Z170 Estrogen receptor positive status [ER+]: Secondary | ICD-10-CM

## 2013-09-02 LAB — COMPREHENSIVE METABOLIC PANEL (CC13)
ALT: 22 U/L (ref 0–55)
AST: 20 U/L (ref 5–34)
Albumin: 3.8 g/dL (ref 3.5–5.0)
Alkaline Phosphatase: 111 U/L (ref 40–150)
Anion Gap: 11 mEq/L (ref 3–11)
BILIRUBIN TOTAL: 0.22 mg/dL (ref 0.20–1.20)
BUN: 14.3 mg/dL (ref 7.0–26.0)
CO2: 27 mEq/L (ref 22–29)
CREATININE: 0.8 mg/dL (ref 0.6–1.1)
Calcium: 9.1 mg/dL (ref 8.4–10.4)
Chloride: 108 mEq/L (ref 98–109)
Glucose: 117 mg/dl (ref 70–140)
Potassium: 3.3 mEq/L — ABNORMAL LOW (ref 3.5–5.1)
Sodium: 146 mEq/L — ABNORMAL HIGH (ref 136–145)
Total Protein: 6.7 g/dL (ref 6.4–8.3)

## 2013-09-02 LAB — CBC & DIFF AND RETIC
BASO%: 0.1 % (ref 0.0–2.0)
BASOS ABS: 0 10*3/uL (ref 0.0–0.1)
EOS ABS: 0.5 10*3/uL (ref 0.0–0.5)
EOS%: 4 % (ref 0.0–7.0)
HCT: 35.2 % (ref 34.8–46.6)
HEMOGLOBIN: 10.8 g/dL — AB (ref 11.6–15.9)
IMMATURE RETIC FRACT: 13.2 % — AB (ref 1.60–10.00)
LYMPH%: 48.4 % (ref 14.0–49.7)
MCH: 26.1 pg (ref 25.1–34.0)
MCHC: 30.7 g/dL — ABNORMAL LOW (ref 31.5–36.0)
MCV: 85 fL (ref 79.5–101.0)
MONO#: 1.2 10*3/uL — AB (ref 0.1–0.9)
MONO%: 8.6 % (ref 0.0–14.0)
NEUT%: 38.9 % (ref 38.4–76.8)
NEUTROS ABS: 5.3 10*3/uL (ref 1.5–6.5)
PLATELETS: 248 10*3/uL (ref 145–400)
RBC: 4.14 10*6/uL (ref 3.70–5.45)
RDW: 14.2 % (ref 11.2–14.5)
Retic %: 1.82 % (ref 0.70–2.10)
Retic Ct Abs: 75.35 10*3/uL (ref 33.70–90.70)
WBC: 13.5 10*3/uL — ABNORMAL HIGH (ref 3.9–10.3)
lymph#: 6.6 10*3/uL — ABNORMAL HIGH (ref 0.9–3.3)
nRBC: 0 % (ref 0–0)

## 2013-09-02 LAB — FERRITIN CHCC: Ferritin: 28 ng/ml (ref 9–269)

## 2013-09-02 LAB — CHCC SMEAR

## 2013-09-02 LAB — TECHNOLOGIST REVIEW

## 2013-09-02 MED ORDER — PROCHLORPERAZINE MALEATE 10 MG PO TABS: 10.0000 mg | ORAL_TABLET | Freq: Three times a day (TID) | ORAL | Status: DC

## 2013-09-02 MED ORDER — ONDANSETRON HCL 8 MG PO TABS: ORAL_TABLET | ORAL | Status: DC

## 2013-09-02 NOTE — Telephone Encounter (Signed)
Per staff message and POF I have scheduled appts.  JMW  

## 2013-09-02 NOTE — Telephone Encounter (Signed)
, °

## 2013-09-02 NOTE — Progress Notes (Signed)
ID: [name] [name] OB: [birth year]  MR#: [id number]  FAO#:[id number]  PCP: [name] Reichmann, MD GYN:  [name] [name] SU: [name] [name], [name] [name],  OTHER MD: [name] [name], [name] [name]  CHIEF COMPLAINT: "Now I have another cancer."  HISTORY OF PRESENT ILLNESS: [name] [name] has a history of breast cancer dating back to 1997, [name] [name] performed a right lumpectomy and axillary lymph node dissection for what according to the patient and her family was a stage I invasive ductal breast cancer. She received radiation adjuvantly and then took tamoxifen for 5 years  In February of 2014 she had a normal screening mammography, but in November 2014 she felt a "hard lump" in her right breast. She brought this to the attention of her urologist, Dr. Izora [name], and he set her up for bilateral diagnostic mammography with mammography at [name] 07/13/2013. This showed her breast density to be category B. There was no mammographic abnormality noted but ultrasonography showed a 1.4 cm irregularly shaped solid mass in the right breast at the 8:00 position. This was biopsied the same day, and showed ([id number]) an invasive ductal carcinoma, grade 2, estrogen receptor 100% positive, progesterone receptor 13% positive, with an MIB-1 of 33% and HER-2 amplification with a HER-2: CEP 17 ratio of 3.19, and a HER-2 copy number percent all of 4.15.  The patient's subsequent history is as detailed below  INTERVAL HISTORY: [name] [name] returns today for followup of her breast cancer accompanied by her husband [name] [name]. Since her last visit here she had her definitive surgery, namely a right simple mastectomy 07/28/2013 (she had had a fall after her lymph node dissection with her 1997 surgery, which was followed by radiation). The current pathology (SZA [id number]) showed a 1.6 cm invasive ductal carcinoma. The prognostic panel was not repeated. The inferior margin was initially involved, but cleared with further resection the same day.  The final margin is 1 mm inferiorly.  The patient's case was discussed at the 08/10/2013 multidisciplinary breast cancer conference and the issue of margins was discussed. Dr. Georgette [name] feels any further resection would require skin grafting. The patient is not a candidate for further radiation. Our feeling was that the cancer, while close, is clear in the patient, being HER-2 and estrogen receptor positive, would have a very low risk of local recurrence. Accordingly no further surgery or radiation is planned and we can proceed to systemic treatment.  REVIEW OF SYSTEMS: [name] [name] did moderately well with her surgery. Apparently one of the drains did not function well initially and had to be replaced. She required additional drainages 2 or 3 times, and also required antibiotics. Pain was moderate and has largely resolved. She had a port placed at the same time and she is uncomfortable lying now on either side. Accordingly she is now sleeping as well as before. Other issues include hearing loss, a runny nose, mild seasonal allergies, a sore throat, a history of palpitations, shortness of breath when walking up stairs, mild dry cough, poor appetite, heartburn, loose stools, history of urinary infections and nephrolithiasis, forgetfulness, anxiety, depression, and hot flashes. She denies any baseline peripheral neuropathy. A detailed review of systems was otherwise stable  PAST MEDICAL HISTORY: Past Medical History  Diagnosis Date   Sinus tachycardia     mild resting   HLD (hyperlipidemia)    Orthostatic hypotension     slight in the past   Anxiety    Depression    Diverticulosis    Fibromyalgia 1978   Hypothyroidism  Alopecia    Ruptured disk     one in neck and two in back   Plantar fasciitis    GERD (gastroesophageal reflux disease)    History of syncope     episode 2008--  no recurrence since   Ureteral calculi     bilateral   Leukocytosis    Chronic back pain     "lower back  and upper neck" (07/28/2013)   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 ear    Urgency of urination    Hematuria    Sensation of pressure in bladder area    Wears glasses    Ecchymosis     FROM IV AND LAB WORK THE PAST WEEK  06-17-2013   Stool incontinence     "at times recently"    Kidney stones 2014   CAP (community acquired pneumonia)     admission 06-04-2013 secondary to failed oral antibitotics---  last cxr 06-16-2013  much improved   SOB (shortness of breath) 06-17-2013 CURRENTLY RESIDUAL FROM RECENT CAP    SOB from obesity and deconditioning 2009- eval included CPX   Arthritis     "spine" (07/28/2013)   DDD (degenerative disc disease)    CLL (chronic lymphocytic leukemia) dx'd 05/2013    "I don't think I have it though" (07/28/2013)   Breast cancer 1997; 2014    right    PAST SURGICAL HISTORY: Past Surgical History  Procedure Laterality Date   Lumbar epidural injection      has had 7 injections   Total abdominal hysterectomy w/ bilateral salpingoophorectomy  1997   Breast lumpectomy with axillary lymph node dissection Right 1997   Transthoracic echocardiogram  05-11-2007  dr [name] [name]    normal lvf/  ef 65%   Tonsillectomy  AGE 48   Retinal detachment surgery Right 2013   Cystoscopy with retrograde pyelogram, ureteroscopy and stent placement Bilateral 06/17/2013    Procedure: CYSTOSCOPY WITH RETROGRADE PYELOGRAM, URETEROSCOPY AND LEFT DOUBLE  J STENT PLACEMENT RIGHT URETERAL HOLMIIUM LASER AND DOUBLE J STENT ;  Surgeon: [name] Ben, MD;  Location: [facility];  Service: Urology;  Laterality: Bilateral;   Holmium laser application Bilateral 66/29/4765    Procedure: HOLMIUM LASER APPLICATION;  Surgeon: [name] Ben, MD;  Location: [facility];  Service: Urology;  Laterality: Bilateral;   Mastectomy w/ nodes partial Right 1997   Mastectomy complete / simple Right 07/28/2013   Appendectomy  1997   Breast biopsy Right 2014   Cystoscopy with  ureteroscopy, stone basketry and stent placement Bilateral 2014   Lithotripsy Left ~ 06/2013   Simple mastectomy with axillary sentinel node biopsy Right 07/28/2013    Procedure: RIGHT TOTAL  MASTECTOMY;  Surgeon: Imogene Burn. [name] Dover, MD;  Location: [facility];  Service: General;  Laterality: Right;   Portacath placement Left 07/28/2013    Procedure: ATTEMPTED INSERTION PORT-A-CATH;  Surgeon: Imogene Burn. [name] Dover, MD;  Location: [facility];  Service: General;  Laterality: Left;    FAMILY HISTORY Family History  Problem Relation Age of Onset   Heart disease Maternal Grandfather    Diabetes Maternal Grandfather    Colon cancer Maternal Aunt    Other Paternal Grandmother     brain tumor   Dementia Mother    Diabetes Mother    Osteoporosis Mother    Diabetes Maternal Aunt    Prostate cancer Father    Brain cancer Maternal Grandmother    Bipolar disorder Maternal Aunt    Stroke Maternal Aunt    the patient's mother  died at the age of 43. The patient's father is alive at age 25. She had no brothers or sisters. Her father has a history of prostate cancer. There is no history of breast or ovarian cancer in the family. One maternal first cousin has a history of Hodgkin's lymphoma.  GYNECOLOGIC HISTORY:  Menarche age 2, first live birth age 2. She is GX P1. She underwent total abdominal hysterectomy and bilateral salpingo-oophorectomy in 1997 she did not take hormone replacement.  SOCIAL HISTORY:  She is a Agricultural engineer. Her husband [name] [name] farms approximately 500 acres. Daughter [name] [name] lives in [place] where she works as a Pension scheme manager for a drug company. The patient has no grandchildren. She has two "grand cats". She attends a local united church of [employer]: Not in place   HEALTH MAINTENANCE: History  Substance Use Topics   Smoking status: Never Smoker    Smokeless tobacco: Never Used   Alcohol Use: Yes     Comment: 07/28/2013 "no  alcohol since 1994; never had problem w/it"     Colonoscopy: 2009/ [facility]  PAP: Status post hysterectomy  Bone density: May 10/14/2009 at [name] was normal  Lipid panel:  Allergies  Allergen Reactions   Amoxicillin Itching   Lasix [Furosemide] Nausea And Vomiting and Other (See Comments)    headache   Lithium Other (See Comments)    Dizziness, "cause me to fall"   Sulfa Antibiotics Hives   Trazodone And Nefazodone Other (See Comments)    insomnia   Lyrica [Pregabalin] Diarrhea, Nausea Only and Rash    Current Outpatient Prescriptions  Medication Sig Dispense Refill   ALPRAZolam (XANAX) 0.5 MG tablet TAKE 1 TABLET BY MOUTH EVERY 8 HOURS AS NEEDED  30 tablet  0   amitriptyline (ELAVIL) 150 MG tablet Take 150 mg by mouth at bedtime.       Ascorbic Acid (VITAMIN C) 1000 MG tablet Take 1,000 mg by mouth daily.       azithromycin (ZITHROMAX) 250 MG tablet Take two by mouth on day 1, then one by mouth on day 2-5  6 tablet  0   buPROPion (WELLBUTRIN SR) 150 MG 12 hr tablet Take 150-300 mg by mouth 2 (two) times daily. Take 370m in the morning and 1554mat 2pm       cephALEXin (KEFLEX) 250 MG capsule Take 1 capsule (250 mg total) by mouth 4 (four) times daily.  40 capsule  0   cetirizine (ZYRTEC) 10 MG tablet Take 10 mg by mouth daily.       Cholecalciferol (VITAMIN D-3) 1000 UNITS CAPS Take 5,000 Units by mouth daily.       ibuprofen (ADVIL,MOTRIN) 200 MG tablet Take 800 mg by mouth every 6 (six) hours as needed for moderate pain.       levothyroxine (SYNTHROID, LEVOTHROID) 50 MCG tablet Take 50 mcg by mouth daily before breakfast.       metoprolol succinate (TOPROL-XL) 25 MG 24 hr tablet Take 25 mg by mouth daily.       minoxidil (ROGAINE) 2 % external solution Apply 1 application topically 2 (two) times daily as needed.        Multiple Vitamins-Minerals (HAIR VITAMINS PO) Take 1 tablet by mouth 2 (two) times daily.       Omega-3 Fatty Acids (FISH OIL PO) Take 1  capsule by mouth daily.        omeprazole (PRILOSEC) 20 MG capsule Take 20 mg by mouth every morning.  Oxcarbazepine (TRILEPTAL) 300 MG tablet Take 300 mg by mouth 2 (two) times daily.       oxyCODONE-acetaminophen (PERCOCET/ROXICET) 5-325 MG per tablet Take 1 tablet by mouth every 4 (four) hours as needed for moderate pain.  40 tablet  0   Vilazodone HCl (VIIBRYD) 20 MG TABS Take 1 tablet by mouth daily.       vitamin B-12 (CYANOCOBALAMIN) 1000 MCG tablet Take 1,000 mcg by mouth daily.       No current facility-administered medications for this visit.    OBJECTIVE: 68 year old white woman in no acute distress Filed Vitals:   09/02/13 0850  BP: 143/81  Pulse: 97  Temp: 98.5 F (36.9 C)  Resp: 18     Body mass index is 31.07 kg/(m^2).    ECOG FS:1 - Symptomatic but completely ambulatory  Ocular: Sclerae unicteric, pupils equal and round Ear-nose-throat: Oropharynx shows no thrush or other lesions Lymphatic: No cervical or supraclavicular adenopathy; no inguinal adenopathy Lungs no rales or rhonchi, good excursion bilaterally Heart regular rate and rhythm, no murmur appreciated Abd soft, nontender, positive bowel sounds, no splenomegaly, no masses palpated MSK no focal spinal tenderness, no joint edema Neuro: non-focal, well-oriented, appropriate affect Breasts: The right breast is status post mastectomy. The scar is irregular, but has healed without evidence of dehiscence, erythema, or residual swelling. The right axilla is benign. The left breast is unremarkable.   LAB RESULTS:  CMP     Component Value Date/Time   NA 146* 09/02/2013 0831   NA 145 07/30/2013 0422   K 3.3* 09/02/2013 0831   K 3.9 07/30/2013 0422   CL 111 07/30/2013 0422   CO2 27 09/02/2013 0831   CO2 25 07/30/2013 0422   GLUCOSE 117 09/02/2013 0831   GLUCOSE 101* 07/30/2013 0422   BUN 14.3 09/02/2013 0831   BUN 6 07/30/2013 0422   CREATININE 0.8 09/02/2013 0831   CREATININE 0.76 07/30/2013 0422   CALCIUM 9.1  09/02/2013 0831   CALCIUM 8.3* 07/30/2013 0422   PROT 6.7 09/02/2013 0831   PROT 6.7 06/04/2013 2221   ALBUMIN 3.8 09/02/2013 0831   ALBUMIN 2.6* 06/04/2013 2221   AST 20 09/02/2013 0831   AST 30 06/04/2013 2221   ALT 22 09/02/2013 0831   ALT 45* 06/04/2013 2221   ALKPHOS 111 09/02/2013 0831   ALKPHOS 196* 06/04/2013 2221   BILITOT 0.22 09/02/2013 0831   BILITOT 0.3 06/04/2013 2221   GFRNONAA 85* 07/30/2013 0422   GFRAA >90 07/30/2013 0422    I No results found for this basename: SPEP,  UPEP,   kappa and lambda light chains    Lab Results  Component Value Date   WBC 13.5* 09/02/2013   NEUTROABS 5.3 09/02/2013   HGB 10.8* 09/02/2013   HCT 35.2 09/02/2013   MCV 85.0 09/02/2013   PLT 248 09/02/2013      Chemistry      Component Value Date/Time   NA 146* 09/02/2013 0831   NA 145 07/30/2013 0422   K 3.3* 09/02/2013 0831   K 3.9 07/30/2013 0422   CL 111 07/30/2013 0422   CO2 27 09/02/2013 0831   CO2 25 07/30/2013 0422   BUN 14.3 09/02/2013 0831   BUN 6 07/30/2013 0422   CREATININE 0.8 09/02/2013 0831   CREATININE 0.76 07/30/2013 0422      Component Value Date/Time   CALCIUM 9.1 09/02/2013 0831   CALCIUM 8.3* 07/30/2013 0422   ALKPHOS 111 09/02/2013 0831   ALKPHOS 196* 06/04/2013 2221  AST 20 09/02/2013 0831   AST 30 06/04/2013 2221   ALT 22 09/02/2013 0831   ALT 45* 06/04/2013 2221   BILITOT 0.22 09/02/2013 0831   BILITOT 0.3 06/04/2013 2221       No results found for this basename: LABCA2    No components found with this basename: LABCA125    No results found for this basename: INR,  in the last 168 hours  Urinalysis    Component Value Date/Time   COLORURINE YELLOW 06/04/2013 2129   APPEARANCEUR CLOUDY* 06/04/2013 2129   LABSPEC 1.020 06/04/2013 2129   PHURINE 5.5 06/04/2013 2129   GLUCOSEU NEGATIVE 06/04/2013 2129   HGBUR LARGE* 06/04/2013 2129   BILIRUBINUR mod 06/16/2013 1226   BILIRUBINUR MODERATE* 06/04/2013 2129   KETONESUR 15* 06/04/2013 2129   PROTEINUR 30* 06/04/2013 2129    UROBILINOGEN 4.0 06/16/2013 1226   UROBILINOGEN 0.2 06/04/2013 2129   NITRITE positive 06/16/2013 1226   NITRITE NEGATIVE 06/04/2013 2129   LEUKOCYTESUR large (3+) 06/16/2013 1226    STUDIES: Patient: [name] [name], [name] [name] Collected: 07/28/2013 Client: [facility] Accession: [id number] Received: 07/28/2013 [name] Mesa, MD DOB: 05-Jul-1946 Age: 86 Gender: F Reported: 08/01/2013 [address] Patient Ph: [phone] MRN #: [id number] [place], [place] [address] Visit #: [id number] Chart #: Phone:  Fax: CC: Coralie Keens, MD Bary Castilla, RN [name] Slates, MD REPORT OF SURGICAL PATHOLOGY FINAL DIAGNOSIS Diagnosis 1. Breast, simple mastectomy, Right - INVASIVE DUCTAL CARCINOMA, 1.6 CM. - FOCAL INFERIOR MARGIN INVOLVEMENT. - POSTERIOR MARGIN FREE OF TUMOR. - ONE BENIGN LYMPH NODE (0/1) 2. Breast, excision, Right inferior margin - FOCAL INVASIVE DUCTAL CARCINOMA, 0.1 CM FROM FINAL MARGIN. Microscopic Comment 1. BREAST, INVASIVE TUMOR, WITHOUT LYMPH NODE SAMPLING Specimen, including laterality and lymph node sampling (sentinel, non-sentinel): Right breast and additional inferior margin. Procedure: Mastectomy. Histologic type: Ductal. Grade: III Tubule formation: 3 Nuclear pleomorphism: 2 Mitotic: 3 Tumor size (gross measurement): 1.6 cm Margins: Inferior margin focally involved in mastectomy; additional inferior margin free of tumor. Invasive, distance to closest margin: 0.1 cm from final inferior margin. In-[name], distance to closest margin: N/A If margin positive, focally or broadly: Focal margin involvement in mastectomy (part 1) and 0.1 cm from final inferior margin (part 2). Lymphovascular invasion: Present. Ductal carcinoma in [name]: No. Grade: N/A Extensive intraductal component: N/A Lobular neoplasia: No. Tumor focality: Unifocal. Treatment effect: No. If present, treatment effect in breast tissue, lymph nodes or both: N/A Extent of tumor: 1 of 3 FINAL  for [name] [name], [name] [name] ([id number]) Microscopic Comment(continued) Skin: Free of tumor. Nipple: Free of tumor. Skeletal muscle: N/A Breast prognostic profile: [id number] Estrogen receptor: 100%, strong staining. Progesterone receptor: 13%, strong staining. Her 2 neu: Amplification, ratio 3.19, average copy 4.15. Ki-67: 33% Non-neoplastic breast: 2.2 cm area of dense fibrosis consistent with scar. TNM: pT1c, pN0, pMX Comments: The tumor is 1.6 in greatest dimension and is invasive ductal carcinoma which involves the inferior margin in the mastectomy (part 1). The additional right inferior margin (part 2) shows focal invasive ductal carcinoma which is 0.1 cm from the final inferior margin. Within the central portion of the mastectomy (part 1) there is a 2.2 cm area of dense fibrous tissue consistent with scar and carcinoma is not identified in this area. (JDP:gt, 08/01/13) Claudette Laws MD Pathologist, Electronic Signature Transthoracic Echocardiography  Patient: [name] [name], [name] [name] MR #: [id number] Study Date: 08/23/2013 Gender: F Age: 69 Height: 162.6cm Weight: 82.7kg BSA: 1.39m2 Pt. Status: Room:  PERFORMING [facility]un[facility] HospitalATTENDING Loren Vicens, GHazle Nordmann  ORDERING Daquann Merriott, Hazle Nordmann REFERRING Collins Dimaria, Hazle Nordmann SONOGRAPHER Tresa Res, RDCS REFERRING [name] cc:  ------------------------------------------------------------ LV EF: 55% - 60%  ------------------------------------------------------------ Indications: Neoplasm - breast 174.9.  ------------------------------------------------------------ History: PMH: Pre-op evaluation for chemotherapy.  ------------------------------------------------------------ Study Conclusions  - Left ventricle: The cavity size was normal. Systolic function was normal. The estimated ejection fraction was in the range of 55% to 60%. Wall motion was normal; there were no regional wall motion abnormalities. -  Impressions: Normal average strain from 3 apical views: -21.6%   ASSESSMENT: 68 y.o. [place] woman  (1) status post right lumpectomy and axillary lymph node dissection in 1997 for a stage I breast cancer, treated with adjuvant radiation and tamoxifen for 5 years  (2) status post right breast biopsy 07/13/2013 for a clinical T1c N0, stage IA invasive ductal carcinoma, grade 2, estrogen receptor 100% positive, progesterone receptor 13% positive, with an MIB-1 of 33%, and HER-2 amplification by CISH with a HER2/CEP 17 ratio of 3.19, and an average HER-2 copy number per cell of 4.15  (3) status post right mastectomy 07/28/2013 for a pT1c pN0, stage IA invasive ductal carcinoma, grade 3, with close but negative margins. Prognostic panel was not repeated  (4) to receive weekly paclitaxel x12 with trastuzumab/ pertuzumab every 3 weeks x one year starting 09/20/2048   OTHER PROBLEMS:  (a) the patient has a history of chronic lymphoid leukemia diagnosed by flow cytometry 06/29/2013, the cells being CD5, CD20 and CD23 positive, CD10 negative. This requires only followup.  (b) anemia with a normal MCV--workup in progress  (c) genetics testing pending  (d) the patient may consider eventual reconstruction  PLAN: We spent the better part of today's hour-long appointment discussing Jourdin's final pathology. This confirms a stage I breast cancer, which, given the time interval, likely represents a new primary. (Note that in the area of prior scar there was no active disease). Accordingly we are referring her for genetics counseling as suggested in the multidisciplinary breast cancer conference 08/10/2013 when her case was re\re presented.  We have good data that paclitaxel alone as opposed to paclitaxel and carboplatin is effective in patients like Maelee, given of course in conjunction with anti-HER-2 treatment. Current anti-HER-2 treatment for patients like her consists of double anti-HER-2 therapy  with pertuzumab and trastuzumab. These 2 agents will be given every 3 weeks for 1 year. The paclitaxel will be given weekly for 12 weeks.  Today we talked about the possible toxicities, side effects and complications of these agents. Lise has a favorable pretreatment echocardiogram and this will be repeated every 3 months until she completes her year of treatment. She is going to see [place] on a weekly basis initially. Her mother is in a skilled nursing facility and Llana is I think appropriately concerned regarding visiting. I have suggested she not visit for 2 or 3 weeks once she starts her chemotherapy until we know that her cancer to be fine.  Jessia has a good understanding of the overall plan, and agrees with it. She knows the anal of treatment in her case is cure. She will call with any problems that may develop before her next visit here.  Chauncey Cruel, MD   09/02/2013 9:21 AM

## 2013-09-06 ENCOUNTER — Telehealth: Payer: Self-pay | Admitting: Oncology

## 2013-09-06 NOTE — Telephone Encounter (Signed)
added additional appts for starting 2/10. lmonvm informing pt and pt to get new schedule when she comes in 1/27

## 2013-09-07 ENCOUNTER — Telehealth: Payer: Self-pay | Admitting: *Deleted

## 2013-09-07 NOTE — Telephone Encounter (Signed)
Left message for pt to return my call so I can schedule a genetic appt.  

## 2013-09-08 ENCOUNTER — Other Ambulatory Visit: Payer: Self-pay | Admitting: *Deleted

## 2013-09-08 MED ORDER — LIDOCAINE-PRILOCAINE 2.5-2.5 % EX CREA: 1.0000 "application " | TOPICAL_CREAM | CUTANEOUS | Status: DC | PRN

## 2013-09-09 ENCOUNTER — Encounter: Payer: Self-pay | Admitting: *Deleted

## 2013-09-09 ENCOUNTER — Other Ambulatory Visit: Payer: Self-pay | Admitting: *Deleted

## 2013-09-09 NOTE — Progress Notes (Signed)
[facility] Psychosocial Distress Screening Clinical Social Work  Clinical Social Work was referred by distress screening protocol.  The patient scored a 5 on the Psychosocial Distress Thermometer which indicates moderate distress. Clinical Social Worker phoned to assess for distress and other psychosocial needs. Pt reports she has had many illnesses in the last few months and her mother is older and has been in a SNF with falls recently. Pt has a h/o of anxiety and depression and is currently on medication to assist her, she also sees a psychiatric nurse per her report. CSW also shared [facility] Support Team info and resources available to assist. Pt aware to contact CSW as needed. No other concerns noted.    Clinical Social Worker follow up needed: no  Loren Racer, [place] Social Worker [facility] for [facility] Wednesday, Thursday and Friday Phone: [phone] Fax: [phone]

## 2013-09-12 ENCOUNTER — Telehealth: Payer: Self-pay | Admitting: *Deleted

## 2013-09-12 NOTE — Telephone Encounter (Signed)
Called and confirmed 11/10/13 genetic appt w/ pt.

## 2013-09-19 ENCOUNTER — Other Ambulatory Visit: Payer: Self-pay | Admitting: Physician Assistant

## 2013-09-19 ENCOUNTER — Other Ambulatory Visit: Payer: Self-pay | Admitting: Emergency Medicine

## 2013-09-19 ENCOUNTER — Other Ambulatory Visit: Payer: Self-pay | Admitting: Family Medicine

## 2013-09-19 DIAGNOSIS — C9110 Chronic lymphocytic leukemia of B-cell type not having achieved remission: Secondary | ICD-10-CM

## 2013-09-19 DIAGNOSIS — C50511 Malignant neoplasm of lower-outer quadrant of right female breast: Secondary | ICD-10-CM

## 2013-09-19 DIAGNOSIS — F411 Generalized anxiety disorder: Secondary | ICD-10-CM

## 2013-09-19 MED ORDER — ALPRAZOLAM 0.5 MG PO TABS: ORAL_TABLET | ORAL | Status: DC

## 2013-09-19 NOTE — Telephone Encounter (Signed)
I did give her one refill on her Xanax.

## 2013-09-19 NOTE — Telephone Encounter (Signed)
?   OK to Refill  

## 2013-09-20 ENCOUNTER — Ambulatory Visit (HOSPITAL_BASED_OUTPATIENT_CLINIC_OR_DEPARTMENT_OTHER): Payer: Medicare Other

## 2013-09-20 ENCOUNTER — Encounter: Payer: Self-pay | Admitting: *Deleted

## 2013-09-20 ENCOUNTER — Encounter: Payer: Self-pay | Admitting: Oncology

## 2013-09-20 ENCOUNTER — Other Ambulatory Visit: Payer: Self-pay | Admitting: Oncology

## 2013-09-20 ENCOUNTER — Other Ambulatory Visit: Payer: Self-pay | Admitting: Radiology

## 2013-09-20 ENCOUNTER — Telehealth: Payer: Self-pay | Admitting: *Deleted

## 2013-09-20 ENCOUNTER — Other Ambulatory Visit (HOSPITAL_BASED_OUTPATIENT_CLINIC_OR_DEPARTMENT_OTHER): Payer: Medicare Other

## 2013-09-20 ENCOUNTER — Other Ambulatory Visit: Payer: Self-pay | Admitting: Family Medicine

## 2013-09-20 ENCOUNTER — Encounter: Payer: Self-pay | Admitting: Physician Assistant

## 2013-09-20 ENCOUNTER — Ambulatory Visit (HOSPITAL_BASED_OUTPATIENT_CLINIC_OR_DEPARTMENT_OTHER): Payer: Medicare Other | Admitting: Physician Assistant

## 2013-09-20 VITALS — BP 142/83 | HR 98 | Temp 98.4°F | Resp 18 | Ht 64.0 in | Wt 180.9 lb

## 2013-09-20 VITALS — BP 154/78 | HR 84 | Temp 98.1°F | Resp 16

## 2013-09-20 DIAGNOSIS — C50911 Malignant neoplasm of unspecified site of right female breast: Secondary | ICD-10-CM

## 2013-09-20 DIAGNOSIS — E876 Hypokalemia: Secondary | ICD-10-CM

## 2013-09-20 DIAGNOSIS — C9110 Chronic lymphocytic leukemia of B-cell type not having achieved remission: Secondary | ICD-10-CM

## 2013-09-20 DIAGNOSIS — C50511 Malignant neoplasm of lower-outer quadrant of right female breast: Secondary | ICD-10-CM

## 2013-09-20 DIAGNOSIS — C50919 Malignant neoplasm of unspecified site of unspecified female breast: Secondary | ICD-10-CM

## 2013-09-20 DIAGNOSIS — Z170 Estrogen receptor positive status [ER+]: Secondary | ICD-10-CM

## 2013-09-20 DIAGNOSIS — Z5111 Encounter for antineoplastic chemotherapy: Secondary | ICD-10-CM

## 2013-09-20 DIAGNOSIS — C50519 Malignant neoplasm of lower-outer quadrant of unspecified female breast: Secondary | ICD-10-CM

## 2013-09-20 DIAGNOSIS — F419 Anxiety disorder, unspecified: Secondary | ICD-10-CM

## 2013-09-20 DIAGNOSIS — Z5112 Encounter for antineoplastic immunotherapy: Secondary | ICD-10-CM

## 2013-09-20 DIAGNOSIS — D649 Anemia, unspecified: Secondary | ICD-10-CM

## 2013-09-20 LAB — CBC WITH DIFFERENTIAL/PLATELET
BASO%: 1.2 % (ref 0.0–2.0)
Basophils Absolute: 0.1 10*3/uL (ref 0.0–0.1)
EOS%: 3.4 % (ref 0.0–7.0)
Eosinophils Absolute: 0.4 10*3/uL (ref 0.0–0.5)
HEMATOCRIT: 34.4 % — AB (ref 34.8–46.6)
HEMOGLOBIN: 11 g/dL — AB (ref 11.6–15.9)
LYMPH%: 58.2 % — ABNORMAL HIGH (ref 14.0–49.7)
MCH: 26.3 pg (ref 25.1–34.0)
MCHC: 32 g/dL (ref 31.5–36.0)
MCV: 82.1 fL (ref 79.5–101.0)
MONO#: 0.5 10*3/uL (ref 0.1–0.9)
MONO%: 3.7 % (ref 0.0–14.0)
NEUT#: 4.2 10*3/uL (ref 1.5–6.5)
NEUT%: 33.5 % — AB (ref 38.4–76.8)
Platelets: 201 10*3/uL (ref 145–400)
RBC: 4.19 10*6/uL (ref 3.70–5.45)
RDW: 14.9 % — AB (ref 11.2–14.5)
WBC: 12.5 10*3/uL — AB (ref 3.9–10.3)
lymph#: 7.3 10*3/uL — ABNORMAL HIGH (ref 0.9–3.3)

## 2013-09-20 LAB — COMPREHENSIVE METABOLIC PANEL (CC13)
ALBUMIN: 4.1 g/dL (ref 3.5–5.0)
ALT: 22 U/L (ref 0–55)
ANION GAP: 12 meq/L — AB (ref 3–11)
AST: 20 U/L (ref 5–34)
Alkaline Phosphatase: 88 U/L (ref 40–150)
BUN: 11.8 mg/dL (ref 7.0–26.0)
CALCIUM: 9.6 mg/dL (ref 8.4–10.4)
CHLORIDE: 110 meq/L — AB (ref 98–109)
CO2: 25 meq/L (ref 22–29)
CREATININE: 0.8 mg/dL (ref 0.6–1.1)
Glucose: 106 mg/dl (ref 70–140)
POTASSIUM: 3.1 meq/L — AB (ref 3.5–5.1)
Sodium: 147 mEq/L — ABNORMAL HIGH (ref 136–145)
TOTAL PROTEIN: 6.6 g/dL (ref 6.4–8.3)
Total Bilirubin: 0.23 mg/dL (ref 0.20–1.20)

## 2013-09-20 LAB — TECHNOLOGIST REVIEW

## 2013-09-20 MED ORDER — ACETAMINOPHEN 325 MG PO TABS
ORAL_TABLET | ORAL | Status: AC
  Filled 2013-09-20: qty 2

## 2013-09-20 MED ORDER — ACETAMINOPHEN 325 MG PO TABS
650.0000 mg | ORAL_TABLET | Freq: Once | ORAL | Status: AC
  Administered 2013-09-20: 650 mg via ORAL

## 2013-09-20 MED ORDER — DEXAMETHASONE SODIUM PHOSPHATE 20 MG/5ML IJ SOLN
INTRAMUSCULAR | Status: AC
  Filled 2013-09-20: qty 5

## 2013-09-20 MED ORDER — SODIUM CHLORIDE 0.9 % IV SOLN
Freq: Once | INTRAVENOUS | Status: AC
  Administered 2013-09-20: 11:00:00 via INTRAVENOUS

## 2013-09-20 MED ORDER — TRASTUZUMAB CHEMO INJECTION 440 MG
8.0000 mg/kg | Freq: Once | INTRAVENOUS | Status: AC
  Administered 2013-09-20: 651 mg via INTRAVENOUS
  Filled 2013-09-20: qty 31

## 2013-09-20 MED ORDER — ONDANSETRON HCL 8 MG PO TABS: ORAL_TABLET | ORAL | Status: DC

## 2013-09-20 MED ORDER — SODIUM CHLORIDE 0.9 % IV SOLN: Freq: Once | INTRAVENOUS | Status: AC

## 2013-09-20 MED ORDER — SODIUM CHLORIDE 0.9 % IV SOLN
840.0000 mg | Freq: Once | INTRAVENOUS | Status: AC
  Administered 2013-09-20: 840 mg via INTRAVENOUS
  Filled 2013-09-20: qty 28

## 2013-09-20 MED ORDER — DIPHENHYDRAMINE HCL 50 MG/ML IJ SOLN
INTRAMUSCULAR | Status: AC
  Filled 2013-09-20: qty 1

## 2013-09-20 MED ORDER — SODIUM CHLORIDE 0.9 % IV SOLN
80.0000 mg/m2 | Freq: Once | INTRAVENOUS | Status: AC
  Administered 2013-09-20: 156 mg via INTRAVENOUS
  Filled 2013-09-20: qty 26

## 2013-09-20 MED ORDER — FAMOTIDINE IN NACL 20-0.9 MG/50ML-% IV SOLN
INTRAVENOUS | Status: AC
  Filled 2013-09-20: qty 50

## 2013-09-20 MED ORDER — DIPHENHYDRAMINE HCL 25 MG PO CAPS
25.0000 mg | ORAL_CAPSULE | Freq: Once | ORAL | Status: AC
  Administered 2013-09-20: 25 mg via ORAL

## 2013-09-20 MED ORDER — DIPHENHYDRAMINE HCL 25 MG PO CAPS
ORAL_CAPSULE | ORAL | Status: AC
  Filled 2013-09-20: qty 2

## 2013-09-20 MED ORDER — DEXAMETHASONE SODIUM PHOSPHATE 20 MG/5ML IJ SOLN
20.0000 mg | Freq: Once | INTRAMUSCULAR | Status: AC
  Administered 2013-09-20: 20 mg via INTRAVENOUS

## 2013-09-20 MED ORDER — SODIUM CHLORIDE 0.9 % IJ SOLN
10.0000 mL | INTRAMUSCULAR | Status: DC | PRN
  Administered 2013-09-20: 10 mL
  Filled 2013-09-20: qty 10

## 2013-09-20 MED ORDER — ONDANSETRON 8 MG/50ML IVPB (CHCC)
8.0000 mg | Freq: Once | INTRAVENOUS | Status: AC
  Administered 2013-09-20: 8 mg via INTRAVENOUS

## 2013-09-20 MED ORDER — POTASSIUM CHLORIDE ER 10 MEQ PO CPCR: ORAL_CAPSULE | ORAL | Status: DC

## 2013-09-20 MED ORDER — ONDANSETRON 8 MG/NS 50 ML IVPB
INTRAVENOUS | Status: AC
  Filled 2013-09-20: qty 8

## 2013-09-20 MED ORDER — HEPARIN SOD (PORK) LOCK FLUSH 100 UNIT/ML IV SOLN
500.0000 [IU] | Freq: Once | INTRAVENOUS | Status: AC | PRN
  Administered 2013-09-20: 500 [IU]
  Filled 2013-09-20: qty 5

## 2013-09-20 MED ORDER — FAMOTIDINE IN NACL 20-0.9 MG/50ML-% IV SOLN
20.0000 mg | Freq: Once | INTRAVENOUS | Status: AC
  Administered 2013-09-20: 20 mg via INTRAVENOUS

## 2013-09-20 MED ORDER — DIPHENHYDRAMINE HCL 50 MG/ML IJ SOLN
25.0000 mg | Freq: Once | INTRAMUSCULAR | Status: AC
  Administered 2013-09-20: 25 mg via INTRAVENOUS

## 2013-09-20 NOTE — Telephone Encounter (Signed)
Per staff message and POF I have scheduled appts.  JMW  

## 2013-09-20 NOTE — Progress Notes (Signed)
RECEIVED A FAX FROM [facility] CONCERNING A PRIOR AUTHORIZATION FOR ONDANSETRON. THIS REQUEST WAS PLACED IN THE MANAGED CARE BIN. 

## 2013-09-20 NOTE — Progress Notes (Signed)
Faxed ondansetron pa form to [employer] °

## 2013-09-20 NOTE — Progress Notes (Signed)
ID: [name] [name] OB: 11/07/1945  MR#: [id number]  CSN#:[id number]  PCP: DAUB, STEVE A, MD GYN:  M. Suzanne Miller SU: Matthew Tsuei, Marc Nesi,  OTHER MD: Rick Cornella, David Spivey, Warren Pickard, Mary Beth Dixon  CHIEF COMPLAINT:  Right Breast Cancer  HISTORY OF PRESENT ILLNESS: [name] [name] has a history of breast cancer dating back to 1997, [name] Dr. Timothy Davis performed a right lumpectomy and axillary lymph node dissection for what according to the patient and her family was a stage I invasive ductal breast cancer. She received radiation adjuvantly and then took tamoxifen for 5 years  In February of 2014 she had a normal screening mammography, but in November 2014 she felt a "hard lump" in her right breast. She brought this to the attention of her urologist, Dr. Nancy, and he set her up for bilateral diagnostic mammography with mammography at [facility] 07/13/2013. This showed her breast density to be category B. There was no mammographic abnormality noted but ultrasonography showed a 1.4 cm irregularly shaped solid mass in the right breast at the 8:00 position. This was biopsied the same day, and showed ([id number]) an invasive ductal carcinoma, grade 2, estrogen receptor 100% positive, progesterone receptor 13% positive, with an MIB-1 of 33% and HER-2 amplification with a HER-2: CEP 17 ratio of 3.19, and a HER-2 copy number percent all of 4.15.  The patient's subsequent history is as detailed below  INTERVAL HISTORY: [name] [name] returns today accompanied by her husband Philip for followup of her right breast cancer.  She has recovered well from her right mastectomy at this point, and is now ready to initiate adjuvant chemotherapy. The plan is to treat with weekly paclitaxel x12, with trastuzumab/pertuzumab given every 3 weeks and continue for total of one year. She is due for day 1 cycle 1 today, consisting of all 3 agents.   [name] [name] has her prochlorperazine as prescribed by Dr. Magrinat, but for some  reason the ondansetron prescription was not filled by the pharmacy. We did review how to take both of these medications in detail today.  Overall, [name] is a little anxious about treatment, but is ready to get started.  REVIEW OF SYSTEMS: [name] denies any fevers or chills. She does have significant hot flashes. She does have some insomnia and moderate fatigue. She has a history of some hearing loss and also has a runny nose and sinus congestions. She has a history of mouth ulcers, although non-today. She has reflux. She currently has no nausea or emesis. She does have loose stools, an average of 3 daily which is normal for her. There is no blood or mucus in the stool. She's had no cough, phlegm production, shortness of breath, orthopnea, peripheral swelling, or chest pain. She occasionally has palpitations. She's had no abnormal headaches or dizziness, but feels forgetful at times. She has anxiety and depression but denies suicidal ideation. She has chronic pain in her back secondary to 3 ruptured discs, and also has a history of arthritis. At baseline, she denies any problems with numbness or tingling in the upper or lower extremities.  A detailed review of systems is otherwise stable and noncontributory.   PAST MEDICAL HISTORY: Past Medical History  Diagnosis Date  . Sinus tachycardia     mild resting  . HLD (hyperlipidemia)   . Orthostatic hypotension     slight in the past  . Anxiety   . Depression   . Diverticulosis   . Fibromyalgia 1978  . Hypothyroidism   .   Alopecia   . Ruptured disk     one in neck and two in back  . Plantar fasciitis   . GERD (gastroesophageal reflux disease)   . History of syncope     episode 2008--  no recurrence since  . Ureteral calculi     bilateral  . Leukocytosis   . Chronic back pain     "lower back and upper neck" (07/28/2013)  . 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 ear   . Urgency of urination   . Hematuria   . Sensation of pressure in bladder area   . Wears  glasses   . Ecchymosis     FROM IV AND LAB WORK THE PAST WEEK  06-17-2013  . Stool incontinence     "at times recently"   . Kidney stones 2014  . CAP (community acquired pneumonia)     admission 06-04-2013 secondary to failed oral antibitotics---  last cxr 06-16-2013  much improved  . SOB (shortness of breath) 06-17-2013 CURRENTLY RESIDUAL FROM RECENT CAP    SOB from obesity and deconditioning 2009- eval included CPX  . Arthritis     "spine" (07/28/2013)  . DDD (degenerative disc disease)   . CLL (chronic lymphocytic leukemia) dx'd 05/2013    "I don't think I have it though" (07/28/2013)  . Breast cancer 1997; 2014    right    PAST SURGICAL HISTORY: Past Surgical History  Procedure Laterality Date  . Lumbar epidural injection      has had 7 injections  . Total abdominal hysterectomy w/ bilateral salpingoophorectomy  1997  . Breast lumpectomy with axillary lymph node dissection Right 1997  . Transthoracic echocardiogram  05-11-2007  dr katz    normal lvf/  ef 65%  . Tonsillectomy  AGE 15  . Retinal detachment surgery Right 2013  . Cystoscopy with retrograde pyelogram, ureteroscopy and stent placement Bilateral 06/17/2013    Procedure: CYSTOSCOPY WITH RETROGRADE PYELOGRAM, URETEROSCOPY AND LEFT DOUBLE  J STENT PLACEMENT RIGHT URETERAL HOLMIIUM LASER AND DOUBLE J STENT ;  Surgeon: Marc-Henry Nesi, MD;  Location: [facility];  Service: Urology;  Laterality: Bilateral;  . Holmium laser application Bilateral 06/17/2013    Procedure: HOLMIUM LASER APPLICATION;  Surgeon: Marc-Henry Nesi, MD;  Location: [facility];  Service: Urology;  Laterality: Bilateral;  . Mastectomy w/ nodes partial Right 1997  . Mastectomy complete / simple Right 07/28/2013  . Appendectomy  1997  . Breast biopsy Right 2014  . Cystoscopy with ureteroscopy, stone basketry and stent placement Bilateral 2014  . Lithotripsy Left ~ 06/2013  . Simple mastectomy with axillary sentinel node  biopsy Right 07/28/2013    Procedure: RIGHT TOTAL  MASTECTOMY;  Surgeon: Matthew K. Tsuei, MD;  Location: [facility];  Service: General;  Laterality: Right;  . Portacath placement Left 07/28/2013    Procedure: ATTEMPTED INSERTION PORT-A-CATH;  Surgeon: Matthew K. Tsuei, MD;  Location: [facility];  Service: General;  Laterality: Left;    FAMILY HISTORY Family History  Problem Relation Age of Onset  . Heart disease Maternal Grandfather   . Diabetes Maternal Grandfather   . Colon cancer Maternal Aunt   . Other Paternal Grandmother     brain tumor  . Dementia Mother   . Diabetes Mother   . Osteoporosis Mother   . Diabetes Maternal Aunt   . Prostate cancer Father   . Brain cancer Maternal Grandmother   . Bipolar disorder Maternal Aunt   . Stroke Maternal Aunt    the patient's mother   died at the age of 68. The patient's father is alive at age 52. She had no brothers or sisters. Her father has a history of prostate cancer. There is no history of breast or ovarian cancer in the family. One maternal first cousin has a history of Hodgkin's lymphoma.  GYNECOLOGIC HISTORY:  Menarche age 38, first live birth age 8. She is GX P1. She underwent total abdominal hysterectomy and bilateral salpingo-oophorectomy in 1997 she did not take hormone replacement.  SOCIAL HISTORY: (Updated January 2015) She is a Agricultural engineer. Her husband Arnette Norris farms approximately 500 acres. Daughter Colletta [place] lives in [place] where she works as a Pension scheme manager for a drug company. The patient has no grandchildren. She has two "grand cats". She attends a local [employer] church of [place]: Not in place   HEALTH MAINTENANCE:  (Updated January 2015) History  Substance Use Topics  . Smoking status: Never Smoker   . Smokeless tobacco: Never Used  . Alcohol Use: Yes     Comment: 07/28/2013 "no alcohol since 1994; never had problem w/it"     Colonoscopy: 2009/ [facility]  PAP: Status post  hysterectomy  Bone density: May 10/14/2009 at [facility] was normal  Lipid panel:  Not on file   Allergies  Allergen Reactions  . Amoxicillin Itching  . Lasix [Furosemide] Nausea And Vomiting and Other (See Comments)    headache  . Lithium Other (See Comments)    Dizziness, "cause me to fall"  . Sulfa Antibiotics Hives  . Trazodone And Nefazodone Other (See Comments)    insomnia  . Lyrica [Pregabalin] Diarrhea, Nausea Only and Rash    Current Outpatient Prescriptions  Medication Sig Dispense Refill  . amitriptyline (ELAVIL) 150 MG tablet Take 150 mg by mouth at bedtime.      . Ascorbic Acid (VITAMIN C) 1000 MG tablet Take 1,000 mg by mouth daily.      Marland Kitchen buPROPion (WELLBUTRIN SR) 150 MG 12 hr tablet Take 150-300 mg by mouth 2 (two) times daily. Take 311m in the morning and 1580mat 2pm      . cetirizine (ZYRTEC) 10 MG tablet Take 10 mg by mouth daily.      . Cholecalciferol (VITAMIN D-3) 1000 UNITS CAPS Take 5,000 Units by mouth daily.      . Marland Kitchenevothyroxine (SYNTHROID, LEVOTHROID) 50 MCG tablet Take 50 mcg by mouth daily before breakfast.      . lidocaine-prilocaine (EMLA) cream Apply 1 application topically as needed.  30 g  3  . metoprolol succinate (TOPROL-XL) 25 MG 24 hr tablet Take 25 mg by mouth daily.      . minoxidil (ROGAINE) 2 % external solution Apply 1 application topically 2 (two) times daily as needed.       . Multiple Vitamins-Minerals (HAIR VITAMINS PO) Take 1 tablet by mouth 2 (two) times daily.      . Omega-3 Fatty Acids (FISH OIL PO) Take 1 capsule by mouth daily.       . Marland Kitchenmeprazole (PRILOSEC) 20 MG capsule Take 20 mg by mouth every morning.       . Oxcarbazepine (TRILEPTAL) 300 MG tablet Take 300 mg by mouth 2 (two) times daily.      . Marland KitchenxyCODONE-acetaminophen (PERCOCET/ROXICET) 5-325 MG per tablet Take 1 tablet by mouth every 4 (four) hours as needed for moderate pain.  40 tablet  0  . Vilazodone HCl (VIIBRYD) 20 MG TABS Take 1 tablet by mouth daily.      .Marland Kitchen  vitamin  B-12 (CYANOCOBALAMIN) 1000 MCG tablet Take 1,000 mcg by mouth daily.      Marland Kitchen ALPRAZolam (XANAX) 0.5 MG tablet TAKE 1 TABLET BY MOUTH EVERY 8 HOURS AS NEEDED  30 tablet  0  . ibuprofen (ADVIL,MOTRIN) 200 MG tablet Take 800 mg by mouth every 6 (six) hours as needed for moderate pain.      Marland Kitchen ondansetron (ZOFRAN) 8 MG tablet 1 tab by mouth at dinner on day of chemo, then 1 tab with breakfast and dinner on day after chemo.  Then 1 tab every 12 hrs PRN nausea  30 tablet  1  . potassium chloride (MICRO-K) 10 MEQ CR capsule 2 tabs by mouth in the morning and 1 tab by mouth in the evening  90 capsule  3  . prochlorperazine (COMPAZINE) 10 MG tablet Take 1 tablet (10 mg total) by mouth 4 (four) times daily -  before meals and at bedtime. Take before meals and at bedtime starting before supper  the evening of chemo and copntinue through the next day. Then take AS NEEDED before meals and at bedtime  30 tablet  1   No current facility-administered medications for this visit.   Facility-Administered Medications Ordered in Other Visits  Medication Dose Route Frequency Provider Last Rate Last Dose  . Dexamethasone Sodium Phosphate (DECADRON) injection 20 mg  20 mg Intravenous Once Chauncey Cruel, MD      . diphenhydrAMINE (BENADRYL) injection 25 mg  25 mg Intravenous Once Chauncey Cruel, MD      . famotidine (PEPCID) IVPB 20 mg  20 mg Intravenous Once Chauncey Cruel, MD      . heparin lock flush 100 unit/mL  500 Units Intracatheter Once PRN Krisandra Bueno Milda Smart, PA-C      . ondansetron (ZOFRAN) IVPB 8 mg  8 mg Intravenous Once Chauncey Cruel, MD      . PACLitaxel (TAXOL) 156 mg in sodium chloride 0.9 % 250 mL chemo infusion (</= 1m/m2)  80 mg/m2 (Treatment Plan Actual) Intravenous Once GChauncey Cruel MD      . pertuzumab (PERJETA) 840 mg in sodium chloride 0.9 % 250 mL chemo infusion  840 mg Intravenous Once Annalysse Shoemaker G Lakindra Wible, PA-C      . sodium chloride 0.9 % injection 10 mL  10 mL Intracatheter PRN Criag Wicklund G Irelyn Perfecto,  PA-C      . trastuzumab (HERCEPTIN) 651 mg in sodium chloride 0.9 % 250 mL chemo infusion  8 mg/kg (Treatment Plan Actual) Intravenous Once ATheotis Burrow PA-C 187.3 mL/hr at 09/20/13 1140 651 mg at 09/20/13 1140    OBJECTIVE: 68year old white woman who appears anxious but is in no acute distress Filed Vitals:   09/20/13 0915  BP: 142/83  Pulse: 98  Temp: 98.4 F (36.9 C)  Resp: 18     Body mass index is 31.04 kg/(m^2).    ECOG FS:1 - Symptomatic but completely ambulatory Filed Weights   09/20/13 0915  Weight: 180 lb 14.4 oz (82.056 kg)   Physical Exam: HEENT:  Sclerae anicteric.  Oropharynx clear, pink, and moist. Currently no ulcerations, and no evidence of oropharyngeal candidiasis. Neck is supple, trachea midline.  NODES:  No cervical or supraclavicular lymphadenopathy palpated.  BREAST EXAM: Patient is status post right mastectomy. Axillae are benign bilaterally, with no palpable lymphadenopathy. LUNGS:  Clear to auscultation bilaterally.  No wheezes or rhonchi HEART:  Regular rate and rhythm. No murmur appreciated. ABDOMEN:  Soft, nontender. No organomegaly or masses palpated.  Positive bowel sounds.  MSK:  No focal spinal tenderness to palpation. Good range of motion bilaterally in the upper extremities. EXTREMITIES:  No peripheral edema.   SKIN:  Benign with no visible rashes or skin lesions. No nail dyscrasia. No ecchymoses or petechiae. No pallor. NEURO:  Nonfocal. Well oriented.  Anxious affect.     LAB RESULTS:   Lab Results  Component Value Date   WBC 12.5* 09/20/2013   NEUTROABS 4.2 09/20/2013   HGB 11.0* 09/20/2013   HCT 34.4* 09/20/2013   MCV 82.1 09/20/2013   PLT 201 09/20/2013      Chemistry      Component Value Date/Time   NA 147* 09/20/2013 0819   NA 145 07/30/2013 0422   K 3.1* 09/20/2013 0819   K 3.9 07/30/2013 0422   CL 111 07/30/2013 0422   CO2 25 09/20/2013 0819   CO2 25 07/30/2013 0422   BUN 11.8 09/20/2013 0819   BUN 6 07/30/2013 0422   CREATININE  0.8 09/20/2013 0819   CREATININE 0.76 07/30/2013 0422      Component Value Date/Time   CALCIUM 9.6 09/20/2013 0819   CALCIUM 8.3* 07/30/2013 0422   ALKPHOS 88 09/20/2013 0819   ALKPHOS 196* 06/04/2013 2221   AST 20 09/20/2013 0819   AST 30 06/04/2013 2221   ALT 22 09/20/2013 0819   ALT 45* 06/04/2013 2221   BILITOT 0.23 09/20/2013 0819   BILITOT 0.3 06/04/2013 2221      STUDIES:  An echocardiogram on 08/23/2013 showed an ejection fraction of 55-60%.  Due to be repeated in late April, 3 months after initiating therapy.   ASSESSMENT: 68 y.o. [place] woman  (1) status post right lumpectomy and axillary lymph node dissection in 1997 for a stage I breast cancer, treated with adjuvant radiation and tamoxifen for 5 years  (2) status post right breast biopsy 07/13/2013 for a clinical T1c N0, stage IA invasive ductal carcinoma, grade 2, estrogen receptor 100% positive, progesterone receptor 13% positive, with an MIB-1 of 33%, and HER-2 amplification by C[facility] with a HER2/CEP 17 ratio of 3.19, and an average HER-2 copy number per cell of 4.15  (3) status post right mastectomy 07/28/2013 for a pT1c pN0, stage IA invasive ductal carcinoma, grade 3, with close but negative margins. Prognostic panel was not repeated  (4) to receive weekly paclitaxel x12 with trastuzumab/ pertuzumab every 3 weeks x one year starting 09/20/2048   OTHER PROBLEMS:  (a) the patient has a history of chronic lymphoid leukemia diagnosed by flow cytometry 06/29/2013, the cells being CD5, CD20 and CD23 positive, CD10 negative. This requires only followup.  (b) anemia with a normal MCV and normal ferritin  (c) genetics testing pending  (d) the patient may consider eventual reconstruction   PLAN: The majority of our 35 minute appointment today was spent reviewing the patient's treatment plan, discussing her anti-nausea regimen, reviewing possible side effects, and coordinating care.  Lucely will receive her first of  12 planned weekly doses of paclitaxel today, given along with her first dose of q. three-week trastuzumab/pertuzumab.  She will take her antinausea medication prophylactically through tomorrow night. This will include: Ondansetron (8 mg, one tablet at dinner on the evening of chemotherapy, then one tablet with breakfast and dinner tomorrow; then one tablet every 12 hours as needed); and prochlorperazine (10 mg, one tablet at dinner and bedtime on day of chemotherapy, then one tablet with meals and at bedtime on day 2; then one tablet every 6 hours if  needed for nausea). She already has prochlorperazine as noted above, and I have sent a new prescription for the ondansetron to her pharmacy. She also has her prescription for Emla cream which she has applied appropriately to the port area today.    We are also starting Kierrah on a potassium supplement for hypokalemia with a potassium of 3.1 today. She's been started on Micro-K, 10 mEq, and has been instructed to take 2 tablets in the morning and 1 at night. We will continue to follow her labs and weekly basis with each appointment.  I will see Annaleigh again next week on February 3 for assessment of chemotoxicity prior to her second weekly dose of paclitaxel. She is scheduled out for weekly visits through early March. I will mention that her next echocardiogram will be due in late April, 3 months after initiating treatment.  All the above was reviewed in detail with the patient and her husband today. She was also given all the above instructions in writing, and she voices understanding and agreement with this plan. She knows to call with any changes or problems prior to her next scheduled appointment.   ,, PA-C   09/20/2013 12:08 PM 

## 2013-09-20 NOTE — Telephone Encounter (Signed)
Dr Everlene Farrier wants to make sure the patients alprazolam has gone through. [name] [name] has printed, she will get this to me, I will either call it or get signed to fax it.

## 2013-09-20 NOTE — Telephone Encounter (Signed)
appts made and printed. Pt is aware that tx will be adjust for 10/25/13. i sent email to MW to adjust the time...td

## 2013-09-20 NOTE — Patient Instructions (Signed)
[facility] Discharge Instructions for Patients Receiving Chemotherapy  Today you received the following chemotherapy agents Herceptin/Perjeta/Taxol To help prevent nausea and vomiting after your treatment, we encourage you to take your nausea medication as prescribed. If you develop nausea and vomiting that is not controlled by your nausea medication, call the clinic.   BELOW ARE SYMPTOMS THAT SHOULD BE REPORTED IMMEDIATELY:  *FEVER GREATER THAN 100.5 F  *CHILLS WITH OR WITHOUT FEVER  NAUSEA AND VOMITING THAT IS NOT CONTROLLED WITH YOUR NAUSEA MEDICATION  *UNUSUAL SHORTNESS OF BREATH  *UNUSUAL BRUISING OR BLEEDING  TENDERNESS IN MOUTH AND THROAT WITH OR WITHOUT PRESENCE OF ULCERS  *URINARY PROBLEMS  *BOWEL PROBLEMS  UNUSUAL RASH Items with * indicate a potential emergency and should be followed up as soon as possible.  Feel free to call the clinic you have any questions or concerns. The clinic phone number is [phone].  Paclitaxel injection (Taxol) What is this medicine? PACLITAXEL (PAK li TAX el) is a chemotherapy drug. It targets fast dividing cells, like cancer cells, and causes these cells to die. This medicine is used to treat ovarian cancer, breast cancer, and other cancers. This medicine may be used for other purposes; ask your health care provider or pharmacist if you have questions. COMMON BRAND NAME(S): Onxol , Taxol What should I tell my health care provider before I take this medicine? They need to know if you have any of these conditions: -blood disorders -irregular heartbeat -infection (especially a virus infection such as chickenpox, cold sores, or herpes) -liver disease -previous or ongoing radiation therapy -an unusual or allergic reaction to paclitaxel, alcohol, polyoxyethylated castor oil, other chemotherapy agents, other medicines, foods, dyes, or preservatives -pregnant or trying to get pregnant -breast-feeding How should I use  this medicine? This drug is given as an infusion into a vein. It is administered in a hospital or clinic by a specially trained health care professional. Talk to your pediatrician regarding the use of this medicine in children. Special care may be needed. Overdosage: If you think you have taken too much of this medicine contact a poison control center or emergency room at once. NOTE: This medicine is only for you. Do not share this medicine with others. What if I miss a dose? It is important not to miss your dose. Call your doctor or health care professional if you are unable to keep an appointment. What may interact with this medicine? Do not take this medicine with any of the following medications: -disulfiram -metronidazole This medicine may also interact with the following medications: -cyclosporine -diazepam -ketoconazole -medicines to increase blood counts like filgrastim, pegfilgrastim, sargramostim -other chemotherapy drugs like cisplatin, doxorubicin, epirubicin, etoposide, teniposide, vincristine -quinidine -testosterone -vaccines -verapamil Talk to your doctor or health care professional before taking any of these medicines: -acetaminophen -aspirin -ibuprofen -ketoprofen -naproxen This list may not describe all possible interactions. Give your health care provider a list of all the medicines, herbs, non-prescription drugs, or dietary supplements you use. Also tell them if you smoke, drink alcohol, or use illegal drugs. Some items may interact with your medicine. What should I watch for while using this medicine? Your condition will be monitored carefully while you are receiving this medicine. You will need important blood work done while you are taking this medicine. This drug may make you feel generally unwell. This is not uncommon, as chemotherapy can affect healthy cells as well as cancer cells. Report any side effects. Continue your course of treatment even though  you  feel ill unless your doctor tells you to stop. In some cases, you may be given additional medicines to help with side effects. Follow all directions for their use. Call your doctor or health care professional for advice if you get a fever, chills or sore throat, or other symptoms of a cold or flu. Do not treat yourself. This drug decreases your body's ability to fight infections. Try to avoid being around people who are sick. This medicine may increase your risk to bruise or bleed. Call your doctor or health care professional if you notice any unusual bleeding. Be careful brushing and flossing your teeth or using a toothpick because you may get an infection or bleed more easily. If you have any dental work done, tell your dentist you are receiving this medicine. Avoid taking products that contain aspirin, acetaminophen, ibuprofen, naproxen, or ketoprofen unless instructed by your doctor. These medicines may hide a fever. Do not become pregnant while taking this medicine. Women should inform their doctor if they wish to become pregnant or think they might be pregnant. There is a potential for serious side effects to an unborn child. Talk to your health care professional or pharmacist for more information. Do not breast-feed an infant while taking this medicine. Men are advised not to father a child while receiving this medicine. What side effects may I notice from receiving this medicine? Side effects that you should report to your doctor or health care professional as soon as possible: -allergic reactions like skin rash, itching or hives, swelling of the face, lips, or tongue -low blood counts - This drug may decrease the number of white blood cells, red blood cells and platelets. You may be at increased risk for infections and bleeding. -signs of infection - fever or chills, cough, sore throat, pain or difficulty passing urine -signs of decreased platelets or bleeding - bruising, pinpoint red spots on  the skin, black, tarry stools, nosebleeds -signs of decreased red blood cells - unusually weak or tired, fainting spells, lightheadedness -breathing problems -chest pain -high or low blood pressure -mouth sores -nausea and vomiting -pain, swelling, redness or irritation at the injection site -pain, tingling, numbness in the hands or feet -slow or irregular heartbeat -swelling of the ankle, feet, hands Side effects that usually do not require medical attention (report to your doctor or health care professional if they continue or are bothersome): -bone pain -complete hair loss including hair on your head, underarms, pubic hair, eyebrows, and eyelashes -changes in the color of fingernails -diarrhea -loosening of the fingernails -loss of appetite -muscle or joint pain -red flush to skin -sweating This list may not describe all possible side effects. Call your doctor for medical advice about side effects. You may report side effects to FDA at 1-800-FDA-1088. Where should I keep my medicine? This drug is given in a hospital or clinic and will not be stored at home. NOTE: This sheet is a summary. It may not cover all possible information. If you have questions about this medicine, talk to your doctor, pharmacist, or health care provider.  2014, [employer] Standard. (2012-10-04 16:41:21)   Trastuzumab injection for infusion (Herceptin) What is this medicine? TRASTUZUMAB (tras TOO zoo mab) is a monoclonal antibody. It targets a protein called HER2. This protein is found in some stomach and breast cancers. This medicine can stop cancer cell growth. This medicine may be used with other cancer treatments. This medicine may be used for other purposes; ask your health care provider  or pharmacist if you have questions. COMMON BRAND NAME(S): Herceptin What should I tell my health care provider before I take this medicine? They need to know if you have any of these conditions: -heart  disease -heart failure -infection (especially a virus infection such as chickenpox, cold sores, or herpes) -lung or breathing disease, like asthma -recent or ongoing radiation therapy -an unusual or allergic reaction to trastuzumab, benzyl alcohol, or other medications, foods, dyes, or preservatives -pregnant or trying to get pregnant -breast-feeding How should I use this medicine? This drug is given as an infusion into a vein. It is administered in a hospital or clinic by a specially trained health care professional. Talk to your pediatrician regarding the use of this medicine in children. This medicine is not approved for use in children. Overdosage: If you think you have taken too much of this medicine contact a poison control center or emergency room at once. NOTE: This medicine is only for you. Do not share this medicine with others. What if I miss a dose? It is important not to miss a dose. Call your doctor or health care professional if you are unable to keep an appointment. What may interact with this medicine? -cyclophosphamide -doxorubicin -warfarin This list may not describe all possible interactions. Give your health care provider a list of all the medicines, herbs, non-prescription drugs, or dietary supplements you use. Also tell them if you smoke, drink alcohol, or use illegal drugs. Some items may interact with your medicine. What should I watch for while using this medicine? Visit your doctor for checks on your progress. Report any side effects. Continue your course of treatment even though you feel ill unless your doctor tells you to stop. Call your doctor or health care professional for advice if you get a fever, chills or sore throat, or other symptoms of a cold or flu. Do not treat yourself. Try to avoid being around people who are sick. You may experience fever, chills and shaking during your first infusion. These effects are usually mild and can be treated with other  medicines. Report any side effects during the infusion to your health care professional. Fever and chills usually do not happen with later infusions. What side effects may I notice from receiving this medicine? Side effects that you should report to your doctor or other health care professional as soon as possible: -breathing difficulties -chest pain or palpitations -cough -dizziness or fainting -fever or chills, sore throat -skin rash, itching or hives -swelling of the legs or ankles -unusually weak or tired Side effects that usually do not require medical attention (report to your doctor or other health care professional if they continue or are bothersome): -loss of appetite -headache -muscle aches -nausea This list may not describe all possible side effects. Call your doctor for medical advice about side effects. You may report side effects to FDA at 1-800-FDA-1088. Where should I keep my medicine? This drug is given in a hospital or clinic and will not be stored at home. NOTE: This sheet is a summary. It may not cover all possible information. If you have questions about this medicine, talk to your doctor, pharmacist, or health care provider.  2014, [employer]/Gold Standard. (2009-06-15 13:43:15)  Pertuzumab injection (Perjeta) What is this medicine? PERTUZUMAB (per TOOZ ue mab) is a monoclonal antibody that targets a protein called HER2. HER2 is found in some breast cancers. This medicine can stop cancer cell growth. This medicine is used with other cancer treatments. This medicine  may be used for other purposes; ask your health care provider or pharmacist if you have questions. COMMON BRAND NAME(S): PERJETA What should I tell my health care provider before I take this medicine? They need to know if you have any of these conditions: -heart disease -heart failure -high blood pressure -history of irregular heart beat -recent or ongoing radiation therapy -an unusual or allergic  reaction to pertuzumab, other medicines, foods, dyes, or preservatives -pregnant or trying to get pregnant -breast-feeding How should I use this medicine? This medicine is for infusion into a vein. It is given by a health care professional in a hospital or clinic setting. Talk to your pediatrician regarding the use of this medicine in children. Special care may be needed. Overdosage: If you think you've taken too much of this medicine contact a poison control center or emergency room at once. Overdosage: If you think you have taken too much of this medicine contact a poison control center or emergency room at once. NOTE: This medicine is only for you. Do not share this medicine with others. What if I miss a dose? It is important not to miss your dose. Call your doctor or health care professional if you are unable to keep an appointment. What may interact with this medicine? Interactions are not expected. Give your health care provider a list of all the medicines, herbs, non-prescription drugs, or dietary supplements you use. Also tell them if you smoke, drink alcohol, or use illegal drugs. Some items may interact with your medicine. This list may not describe all possible interactions. Give your health care provider a list of all the medicines, herbs, non-prescription drugs, or dietary supplements you use. Also tell them if you smoke, drink alcohol, or use illegal drugs. Some items may interact with your medicine. What should I watch for while using this medicine? Your condition will be monitored carefully while you are receiving this medicine. Report any side effects. Continue your course of treatment even though you feel ill unless your doctor tells you to stop. Do not become pregnant while taking this medicine. Women should inform their doctor if they wish to become pregnant or think they might be pregnant. There is a potential for serious side effects to an unborn child. Talk to your health care  professional or pharmacist for more information. Do not breast-feed an infant while taking this medicine. Call your doctor or health care professional for advice if you get a fever, chills or sore throat, or other symptoms of a cold or flu. Do not treat yourself. Try to avoid being around people who are sick. You may experience fever, chills, and headache during the infusion. Report any side effects during the infusion to your health care professional. What side effects may I notice from receiving this medicine? Side effects that you should report to your doctor or health care professional as soon as possible: -breathing problems -chest pain or palpitations -dizziness -feeling faint or lightheaded -fever or chills -skin rash, itching or hives -sore throat -swelling of the face, lips, or tongue -swelling of the legs or ankles -unusually weak or tired  Side effects that usually do not require medical attention (Report these to your doctor or health care professional if they continue or are bothersome.): -diarrhea -hair loss -nausea, vomiting -tiredness This list may not describe all possible side effects. Call your doctor for medical advice about side effects. You may report side effects to FDA at 1-800-FDA-1088. Where should I keep my medicine? This drug  is given in a hospital or clinic and will not be stored at home. NOTE: This sheet is a summary. It may not cover all possible information. If you have questions about this medicine, talk to your doctor, pharmacist, or health care provider.  2014, [employer] Standard. (2012-06-09 16:54:15)

## 2013-09-20 NOTE — Telephone Encounter (Signed)
[name] [name] did find the printed Rx for Alprazolam, she has shredded, I have called in for patient.

## 2013-09-21 ENCOUNTER — Telehealth: Payer: Self-pay | Admitting: *Deleted

## 2013-09-21 NOTE — Telephone Encounter (Signed)
Message copied by Cherylynn Ridges on Wed Sep 21, 2013  1:11 PM ------      Message from: Perlie Gold      Created: Tue Sep 20, 2013  4:25 PM      Regarding: Chemo Follow up call      Contact: [phone]       First time Herceptin/Perjeta/Taxol.  Please call.            Thanks,      Barnetta Chapel, RN ------

## 2013-09-21 NOTE — Telephone Encounter (Signed)
[place] for chemotherapy F/U.  Patient is doing well.  Denies n/v.  Denies any new side effects or symptoms.  Reports she thinks it may be the "Power of suggestion" and may have felt tingling on the top of her feet last night.  Denies any trouble walking.  Bowel and bladder is functioning well.  Eating and drinking well and I instructed to drink 64 oz minimum daily or at least the day before, of and after treatment.  "I feel like I have a bolt of energy and need some more of that juice."  Received dexamethasone 20 mg with treatment.  Reports trouble going to bed early and did not sleep through the night.  Denies further questions at this time and encouraged to call if needed.  Reviewed how to call after hours in the case of an emergency.

## 2013-09-27 ENCOUNTER — Other Ambulatory Visit (HOSPITAL_BASED_OUTPATIENT_CLINIC_OR_DEPARTMENT_OTHER): Payer: Medicare Other

## 2013-09-27 ENCOUNTER — Ambulatory Visit (HOSPITAL_BASED_OUTPATIENT_CLINIC_OR_DEPARTMENT_OTHER): Payer: Medicare Other

## 2013-09-27 ENCOUNTER — Encounter (INDEPENDENT_AMBULATORY_CARE_PROVIDER_SITE_OTHER): Payer: Self-pay

## 2013-09-27 ENCOUNTER — Ambulatory Visit (HOSPITAL_BASED_OUTPATIENT_CLINIC_OR_DEPARTMENT_OTHER): Payer: Medicare Other | Admitting: Physician Assistant

## 2013-09-27 ENCOUNTER — Encounter: Payer: Self-pay | Admitting: Physician Assistant

## 2013-09-27 ENCOUNTER — Telehealth: Payer: Self-pay | Admitting: *Deleted

## 2013-09-27 VITALS — BP 112/76 | HR 86 | Temp 98.6°F | Resp 18 | Ht 64.0 in | Wt 176.7 lb

## 2013-09-27 DIAGNOSIS — D649 Anemia, unspecified: Secondary | ICD-10-CM

## 2013-09-27 DIAGNOSIS — C50919 Malignant neoplasm of unspecified site of unspecified female breast: Secondary | ICD-10-CM

## 2013-09-27 DIAGNOSIS — C50519 Malignant neoplasm of lower-outer quadrant of unspecified female breast: Secondary | ICD-10-CM

## 2013-09-27 DIAGNOSIS — Z5111 Encounter for antineoplastic chemotherapy: Secondary | ICD-10-CM

## 2013-09-27 DIAGNOSIS — E876 Hypokalemia: Secondary | ICD-10-CM

## 2013-09-27 DIAGNOSIS — Z853 Personal history of malignant neoplasm of breast: Secondary | ICD-10-CM

## 2013-09-27 DIAGNOSIS — F419 Anxiety disorder, unspecified: Secondary | ICD-10-CM

## 2013-09-27 DIAGNOSIS — C50511 Malignant neoplasm of lower-outer quadrant of right female breast: Secondary | ICD-10-CM

## 2013-09-27 DIAGNOSIS — Z87898 Personal history of other specified conditions: Secondary | ICD-10-CM

## 2013-09-27 DIAGNOSIS — R197 Diarrhea, unspecified: Secondary | ICD-10-CM

## 2013-09-27 DIAGNOSIS — M1990 Unspecified osteoarthritis, unspecified site: Secondary | ICD-10-CM

## 2013-09-27 DIAGNOSIS — C9110 Chronic lymphocytic leukemia of B-cell type not having achieved remission: Secondary | ICD-10-CM

## 2013-09-27 DIAGNOSIS — K219 Gastro-esophageal reflux disease without esophagitis: Secondary | ICD-10-CM | POA: Insufficient documentation

## 2013-09-27 DIAGNOSIS — C50911 Malignant neoplasm of unspecified site of right female breast: Secondary | ICD-10-CM

## 2013-09-27 LAB — COMPREHENSIVE METABOLIC PANEL (CC13)
ALK PHOS: 100 U/L (ref 40–150)
ALT: 45 U/L (ref 0–55)
AST: 17 U/L (ref 5–34)
Albumin: 4.4 g/dL (ref 3.5–5.0)
Anion Gap: 10 mEq/L (ref 3–11)
BUN: 25.1 mg/dL (ref 7.0–26.0)
CO2: 26 mEq/L (ref 22–29)
CREATININE: 1.1 mg/dL (ref 0.6–1.1)
Calcium: 10.5 mg/dL — ABNORMAL HIGH (ref 8.4–10.4)
Chloride: 106 mEq/L (ref 98–109)
GLUCOSE: 108 mg/dL (ref 70–140)
Potassium: 5.3 mEq/L — ABNORMAL HIGH (ref 3.5–5.1)
SODIUM: 142 meq/L (ref 136–145)
TOTAL PROTEIN: 7.1 g/dL (ref 6.4–8.3)

## 2013-09-27 LAB — CBC WITH DIFFERENTIAL/PLATELET
BASO%: 0.6 % (ref 0.0–2.0)
Basophils Absolute: 0.1 10*3/uL (ref 0.0–0.1)
EOS%: 2.7 % (ref 0.0–7.0)
Eosinophils Absolute: 0.3 10*3/uL (ref 0.0–0.5)
HCT: 41.1 % (ref 34.8–46.6)
HGB: 12.8 g/dL (ref 11.6–15.9)
LYMPH%: 55.2 % — AB (ref 14.0–49.7)
MCH: 26.2 pg (ref 25.1–34.0)
MCHC: 31.1 g/dL — ABNORMAL LOW (ref 31.5–36.0)
MCV: 84 fL (ref 79.5–101.0)
MONO#: 0.7 10*3/uL (ref 0.1–0.9)
MONO%: 5.5 % (ref 0.0–14.0)
NEUT#: 4.4 10*3/uL (ref 1.5–6.5)
NEUT%: 36 % — ABNORMAL LOW (ref 38.4–76.8)
NRBC: 0 % (ref 0–0)
Platelets: 315 10*3/uL (ref 145–400)
RBC: 4.89 10*6/uL (ref 3.70–5.45)
RDW: 14.8 % — ABNORMAL HIGH (ref 11.2–14.5)
WBC: 12.3 10*3/uL — ABNORMAL HIGH (ref 3.9–10.3)
lymph#: 6.8 10*3/uL — ABNORMAL HIGH (ref 0.9–3.3)

## 2013-09-27 LAB — TECHNOLOGIST REVIEW

## 2013-09-27 MED ORDER — SODIUM CHLORIDE 0.9 % IJ SOLN
10.0000 mL | INTRAMUSCULAR | Status: DC | PRN
  Administered 2013-09-27: 10 mL
  Filled 2013-09-27: qty 10

## 2013-09-27 MED ORDER — HEPARIN SOD (PORK) LOCK FLUSH 100 UNIT/ML IV SOLN
500.0000 [IU] | Freq: Once | INTRAVENOUS | Status: AC | PRN
  Administered 2013-09-27: 500 [IU]
  Filled 2013-09-27: qty 5

## 2013-09-27 MED ORDER — ONDANSETRON 8 MG/50ML IVPB (CHCC)
8.0000 mg | Freq: Once | INTRAVENOUS | Status: AC
  Administered 2013-09-27: 8 mg via INTRAVENOUS

## 2013-09-27 MED ORDER — DEXAMETHASONE SODIUM PHOSPHATE 20 MG/5ML IJ SOLN
INTRAMUSCULAR | Status: AC
  Filled 2013-09-27: qty 5

## 2013-09-27 MED ORDER — DIPHENHYDRAMINE HCL 50 MG/ML IJ SOLN
INTRAMUSCULAR | Status: AC
  Filled 2013-09-27: qty 1

## 2013-09-27 MED ORDER — OMEPRAZOLE 40 MG PO CPDR: 40.0000 mg | DELAYED_RELEASE_CAPSULE | Freq: Every morning | ORAL | Status: DC

## 2013-09-27 MED ORDER — FAMOTIDINE IN NACL 20-0.9 MG/50ML-% IV SOLN
INTRAVENOUS | Status: AC
  Filled 2013-09-27: qty 50

## 2013-09-27 MED ORDER — ONDANSETRON 16 MG/50ML IVPB (CHCC)
INTRAVENOUS | Status: AC
  Filled 2013-09-27: qty 16

## 2013-09-27 MED ORDER — DIPHENHYDRAMINE HCL 50 MG/ML IJ SOLN
25.0000 mg | Freq: Once | INTRAMUSCULAR | Status: AC
  Administered 2013-09-27: 25 mg via INTRAVENOUS

## 2013-09-27 MED ORDER — DEXAMETHASONE SODIUM PHOSPHATE 20 MG/5ML IJ SOLN
20.0000 mg | Freq: Once | INTRAMUSCULAR | Status: AC
Start: 2013-09-27 — End: 2013-09-27
  Administered 2013-09-27: 20 mg via INTRAVENOUS

## 2013-09-27 MED ORDER — FAMOTIDINE IN NACL 20-0.9 MG/50ML-% IV SOLN
20.0000 mg | Freq: Once | INTRAVENOUS | Status: AC
  Administered 2013-09-27: 20 mg via INTRAVENOUS

## 2013-09-27 MED ORDER — ONDANSETRON 8 MG/NS 50 ML IVPB
INTRAVENOUS | Status: AC
  Filled 2013-09-27: qty 8

## 2013-09-27 MED ORDER — PACLITAXEL CHEMO INJECTION 300 MG/50ML
80.0000 mg/m2 | Freq: Once | INTRAVENOUS | Status: AC
  Administered 2013-09-27: 156 mg via INTRAVENOUS
  Filled 2013-09-27: qty 26

## 2013-09-27 MED ORDER — SODIUM CHLORIDE 0.9 % IV SOLN
Freq: Once | INTRAVENOUS | Status: AC
  Administered 2013-09-27: 10:00:00 via INTRAVENOUS

## 2013-09-27 NOTE — Progress Notes (Signed)
ID: [name] [name] OB: 1946/01/29  MR#: [id number]  MEQ#:[id number]  PCP: [name] Reichmann, MD GYN:  [name] [name] SU: [name] [name], [name] [name],  OTHER MD: [name] [name], [name] [name], [name] [name]  CHIEF COMPLAINT:  Right Breast Cancer  HISTORY OF PRESENT ILLNESS: [name] [name] has a history of breast cancer dating back to 1997, [name] Dr. Annabell [name] performed a right lumpectomy and axillary lymph node dissection for what according to the patient and her family was a stage I invasive ductal breast cancer. She received radiation adjuvantly and then took tamoxifen for 5 years  In February of 2014 she had a normal screening mammography, but in November 2014 she felt a "hard lump" in her right breast. She brought this to the attention of her urologist, Dr. Izora [name], and he set her up for bilateral diagnostic mammography with mammography at [facility] 07/13/2013. This showed her breast density to be category B. There was no mammographic abnormality noted but ultrasonography showed a 1.4 cm irregularly shaped solid mass in the right breast at the 8:00 position. This was biopsied the same day, and showed ([id number]) an invasive ductal carcinoma, grade 2, estrogen receptor 100% positive, progesterone receptor 13% positive, with an MIB-1 of 33% and HER-2 amplification with a HER-2: CEP 17 ratio of 3.19, and a HER-2 copy number percent all of 4.15.  The patient's subsequent history is as detailed below  INTERVAL HISTORY: [name] [name] returns today accompanied by her husband [name] [name] for followup of her right breast cancer.  She is currently day 8 cycle 1 of 4 planned cycles of paclitaxel given with trastuzumab/pertuzumab.  She receives paclitaxel weekly, with  trastuzumab/pertuzumab given every 3 weeks.  She'll receive a total of 12 weekly doses of paclitaxel, and will continue HER-2/neu therapy for total of one year. She is due for paclitaxel alone today.   [name] [name] tells me this past week "went ok."  Her  biggest concern was diarrhea. She felt fine Wednesday and Thursday following treatment on Tuesday. On Friday, however, (day 4) she began having diarrhea and had up to 10 episodes throughout the day with minor cramping. There's been no blood or mucus in the stool. She has had no fevers or chills. She took Imodium on Friday, and had no bowel movement at all on Saturday. She had 2 or 3 loose stools on Sunday. Yesterday, her bowels  returned to normal and she had a normal, formed bowel movement.   She has also noticed increased reflux despite taking omeprazole 20 mg daily. She had no problems at all with nausea or emesis.  REVIEW OF SYSTEMS: [name] [name] continues to have hot flashes,  insomnia and moderate fatigue. She complains of mild weakness. She has a history of some hearing loss and also has a runny nose and sinus congestion. She's had no mouth ulcers, oral sensitivity, or problems swallowing. She has mild shortness of breath with exertion and an occasional dry cough. She's had no phlegm production, orthopnea, peripheral swelling, or chest pain. She does have occasional palpitations which are chronic and stable.   She's had no abnormal headaches or dizziness, but feels forgetful at times. She has anxiety and depression but denies suicidal ideation. She has chronic pain in her back secondary to 3 ruptured discs, and also has a history of arthritis. She has had no increased pain, and also denies any signs of peripheral neuropathy.  A detailed review of systems is otherwise stable and noncontributory.   PAST MEDICAL HISTORY: Past Medical History  Diagnosis Date  . Sinus tachycardia     mild resting  . HLD (hyperlipidemia)   . Orthostatic hypotension     slight in the past  . Anxiety   . Depression   . Diverticulosis   . Fibromyalgia 1978  . Hypothyroidism   . Alopecia   . Ruptured disk     one in neck and two in back  . Plantar fasciitis   . GERD (gastroesophageal reflux disease)   . History of  syncope     episode 2008--  no recurrence since  . Ureteral calculi     bilateral  . Leukocytosis   . Chronic back pain     "lower back and upper neck" (07/28/2013)  . 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 ear   . Urgency of urination   . Hematuria   . Sensation of pressure in bladder area   . Wears glasses   . Ecchymosis     FROM IV AND LAB WORK THE PAST WEEK  06-17-2013  . Stool incontinence     "at times recently"   . Kidney stones 2014  . CAP (community acquired pneumonia)     admission 06-04-2013 secondary to failed oral antibitotics---  last cxr 06-16-2013  much improved  . SOB (shortness of breath) 06-17-2013 CURRENTLY RESIDUAL FROM RECENT CAP    SOB from obesity and deconditioning 2009- eval included CPX  . Arthritis     "spine" (07/28/2013)  . DDD (degenerative disc disease)   . CLL (chronic lymphocytic leukemia) dx'd 05/2013    "I don't think I have it though" (07/28/2013)  . Breast cancer 1997; 2014    right    PAST SURGICAL HISTORY: Past Surgical History  Procedure Laterality Date  . Lumbar epidural injection      has had 7 injections  . Total abdominal hysterectomy w/ bilateral salpingoophorectomy  1997  . Breast lumpectomy with axillary lymph node dissection Right 1997  . Transthoracic echocardiogram  05-11-2007  dr Ron Parker    normal lvf/  ef 65%  . Tonsillectomy  AGE 65  . Retinal detachment surgery Right 2013  . Cystoscopy with retrograde pyelogram, ureteroscopy and stent placement Bilateral 06/17/2013    Procedure: CYSTOSCOPY WITH RETROGRADE PYELOGRAM, URETEROSCOPY AND LEFT DOUBLE  J STENT PLACEMENT RIGHT URETERAL HOLMIIUM LASER AND DOUBLE J STENT ;  Surgeon: Hanley Ben, MD;  Location: [place];  Service: Urology;  Laterality: Bilateral;  . Holmium laser application Bilateral 72/90/2111    Procedure: HOLMIUM LASER APPLICATION;  Surgeon: Hanley Ben, MD;  Location: [place];  Service: Urology;  Laterality: Bilateral;  . Mastectomy w/  nodes partial Right 1997  . Mastectomy complete / simple Right 07/28/2013  . Appendectomy  1997  . Breast biopsy Right 2014  . Cystoscopy with ureteroscopy, stone basketry and stent placement Bilateral 2014  . Lithotripsy Left ~ 06/2013  . Simple mastectomy with axillary sentinel node biopsy Right 07/28/2013    Procedure: RIGHT TOTAL  MASTECTOMY;  Surgeon: Imogene Burn. Georgette Dover, MD;  Location: [place];  Service: General;  Laterality: Right;  . Portacath placement Left 07/28/2013    Procedure: ATTEMPTED INSERTION PORT-A-CATH;  Surgeon: Imogene Burn. Georgette Dover, MD;  Location: [place] [facility];  Service: General;  Laterality: Left;    FAMILY HISTORY Family History  Problem Relation Age of Onset  . Heart disease Maternal Grandfather   . Diabetes Maternal Grandfather   . Colon cancer Maternal Aunt   . Other Paternal Grandmother     brain tumor  .  Dementia Mother   . Diabetes Mother   . Osteoporosis Mother   . Diabetes Maternal Aunt   . Prostate cancer Father   . Brain cancer Maternal Grandmother   . Bipolar disorder Maternal Aunt   . Stroke Maternal Aunt    the patient's mother died at the age of 51. The patient's father is alive at age 81. She had no brothers or sisters. Her father has a history of prostate cancer. There is no history of breast or ovarian cancer in the family. One maternal first cousin has a history of Hodgkin's lymphoma.  GYNECOLOGIC HISTORY:  Menarche age 59, first live birth age 1. She is GX P1. She underwent total abdominal hysterectomy and bilateral salpingo-oophorectomy in 1997 she did not take hormone replacement.  SOCIAL HISTORY: (Updated January 2015) She is a Agricultural engineer. Her husband [name] [name] farms approximately 500 acres. Daughter Colletta [place] lives in [place] where she works as a Pension scheme manager for a drug company. The patient has no grandchildren. She has two "grand cats". She attends a local united church of [place]: Not in  place   HEALTH MAINTENANCE:  (Updated January 2015) History  Substance Use Topics  . Smoking status: Never Smoker   . Smokeless tobacco: Never Used  . Alcohol Use: Yes     Comment: 07/28/2013 "no alcohol since 1994; never had problem w/it"     Colonoscopy: 2009/ [facility]  PAP: Status post hysterectomy  Bone density: May 10/14/2009 at [facility] was normal  Lipid panel:  Not on file   Allergies  Allergen Reactions  . Amoxicillin Itching  . Lasix [Furosemide] Nausea And Vomiting and Other (See Comments)    headache  . Lithium Other (See Comments)    Dizziness, "cause me to fall"  . Sulfa Antibiotics Hives  . Trazodone And Nefazodone Other (See Comments)    insomnia  . Lyrica [Pregabalin] Diarrhea, Nausea Only and Rash    Current Outpatient Prescriptions  Medication Sig Dispense Refill  . ALPRAZolam (XANAX) 0.5 MG tablet TAKE 1 TABLET BY MOUTH EVERY 8 HOURS AS NEEDED  30 tablet  0  . amitriptyline (ELAVIL) 150 MG tablet Take 150 mg by mouth at bedtime.      . Ascorbic Acid (VITAMIN C) 1000 MG tablet Take 1,000 mg by mouth daily.      Marland Kitchen buPROPion (WELLBUTRIN SR) 150 MG 12 hr tablet Take 150-300 mg by mouth 2 (two) times daily. Take 333m in the morning and 1567mat 2pm      . cetirizine (ZYRTEC) 10 MG tablet Take 10 mg by mouth daily.      . Cholecalciferol (VITAMIN D-3) 1000 UNITS CAPS Take 5,000 Units by mouth daily.      . Marland Kitchenbuprofen (ADVIL,MOTRIN) 200 MG tablet Take 800 mg by mouth every 6 (six) hours as needed for moderate pain.      . Marland Kitchenevothyroxine (SYNTHROID, LEVOTHROID) 50 MCG tablet Take 50 mcg by mouth daily before breakfast.      . lidocaine-prilocaine (EMLA) cream Apply 1 application topically as needed.  30 g  3  . metoprolol succinate (TOPROL-XL) 25 MG 24 hr tablet Take 25 mg by mouth daily.      . minoxidil (ROGAINE) 2 % external solution Apply 1 application topically 2 (two) times daily as needed.       . Multiple Vitamins-Minerals (HAIR VITAMINS PO) Take 1 tablet by  mouth 2 (two) times daily.      . Omega-3 Fatty Acids (FISH OIL  PO) Take 1 capsule by mouth daily.       Marland Kitchen omeprazole (PRILOSEC) 40 MG capsule Take 1 capsule (40 mg total) by mouth every morning.  30 capsule  3  . Oxcarbazepine (TRILEPTAL) 300 MG tablet Take 300 mg by mouth 2 (two) times daily.      Marland Kitchen oxyCODONE-acetaminophen (PERCOCET/ROXICET) 5-325 MG per tablet Take 1 tablet by mouth every 4 (four) hours as needed for moderate pain.  40 tablet  0  . potassium chloride (MICRO-K) 10 MEQ CR capsule 2 tabs by mouth in the morning and 1 tab by mouth in the evening  90 capsule  3  . prochlorperazine (COMPAZINE) 10 MG tablet Take 1 tablet (10 mg total) by mouth 4 (four) times daily -  before meals and at bedtime. Take before meals and at bedtime starting before supper  the evening of chemo and copntinue through the next day. Then take AS NEEDED before meals and at bedtime  30 tablet  1  . Vilazodone HCl (VIIBRYD) 20 MG TABS Take 1 tablet by mouth daily.      . vitamin B-12 (CYANOCOBALAMIN) 1000 MCG tablet Take 1,000 mcg by mouth daily.      . ondansetron (ZOFRAN) 8 MG tablet 1 tab by mouth at dinner on day of chemo, then 1 tab with breakfast and dinner on day after chemo.  Then 1 tab every 12 hrs PRN nausea  30 tablet  1   No current facility-administered medications for this visit.    OBJECTIVE: 68 year old white woman who appears anxious but is in no acute distress Filed Vitals:   09/27/13 0904  BP: 112/76  Pulse: 86  Temp: 98.6 F (37 C)  Resp: 18     Body mass index is 30.32 kg/(m^2).    ECOG FS:1 - Symptomatic but completely ambulatory Filed Weights   09/27/13 0904  Weight: 176 lb 11.2 oz (80.151 kg)   Physical Exam: HEENT:  Sclerae anicteric.  Oropharynx clear, pink, and moist. No ulcerations, and no evidence of oropharyngeal candidiasis. Neck is supple, trachea midline.  NODES:  No cervical or supraclavicular lymphadenopathy palpated.  BREAST EXAM: Deferred. Axillae are benign  bilaterally, with no palpable lymphadenopathy. LUNGS:  Clear to auscultation bilaterally.  No wheezes, rales, or rhonchi HEART:  Regular rate and rhythm. No murmur appreciated. ABDOMEN:  Soft, nondistended, nontender to palpation. No organomegaly or masses palpated. Positive bowel sounds.  MSK:  No focal spinal tenderness to palpation. Good range of motion bilaterally in the upper extremities. EXTREMITIES:  No peripheral edema.   SKIN:  Benign with no visible rashes or skin lesions. No nail dyscrasia. No ecchymoses or petechiae. No pallor. NEURO:  Nonfocal. Well oriented.  Anxious affect.     LAB RESULTS:   Lab Results  Component Value Date   WBC 12.3* 09/27/2013   NEUTROABS 4.4 09/27/2013   HGB 12.8 09/27/2013   HCT 41.1 09/27/2013   MCV 84.0 09/27/2013   PLT 315 09/27/2013      Chemistry      Component Value Date/Time   NA 147* 09/20/2013 0819   NA 145 07/30/2013 0422   K 3.1* 09/20/2013 0819   K 3.9 07/30/2013 0422   CL 111 07/30/2013 0422   CO2 25 09/20/2013 0819   CO2 25 07/30/2013 0422   BUN 11.8 09/20/2013 0819   BUN 6 07/30/2013 0422   CREATININE 0.8 09/20/2013 0819   CREATININE 0.76 07/30/2013 0422      Component Value Date/Time   CALCIUM  9.6 09/20/2013 0819   CALCIUM 8.3* 07/30/2013 0422   ALKPHOS 88 09/20/2013 0819   ALKPHOS 196* 06/04/2013 2221   AST 20 09/20/2013 0819   AST 30 06/04/2013 2221   ALT 22 09/20/2013 0819   ALT 45* 06/04/2013 2221   BILITOT 0.23 09/20/2013 0819   BILITOT 0.3 06/04/2013 2221      STUDIES:  An echocardiogram on 08/23/2013 showed an ejection fraction of 55-60%.  Due to be repeated in late April, 3 months after initiating therapy.   ASSESSMENT: 68 y.o. [place] woman  (1) status post right lumpectomy and axillary lymph node dissection in 1997 for a stage I breast cancer, treated with adjuvant radiation and tamoxifen for 5 years  (2) status post right breast biopsy 07/13/2013 for a clinical T1c N0, stage IA invasive ductal carcinoma, grade  2, estrogen receptor 100% positive, progesterone receptor 13% positive, with an MIB-1 of 33%, and HER-2 amplification by CISH with a HER2/CEP 17 ratio of 3.19, and an average HER-2 copy number per cell of 4.15  (3) status post right mastectomy 07/28/2013 for a pT1c pN0, stage IA invasive ductal carcinoma, grade 3, with close but negative margins. Prognostic panel was not repeated  (4) to receive weekly paclitaxel x12 with trastuzumab/ pertuzumab every 3 weeks x one year starting 09/20/2048   OTHER PROBLEMS:  (a) the patient has a history of chronic lymphoid leukemia diagnosed by flow cytometry 06/29/2013, the cells being CD5, CD20 and CD23 positive, CD10 negative. This requires only followup.  (b) anemia with a normal MCV and normal ferritin  (c) genetics testing pending  (d) the patient may consider eventual reconstruction   PLAN: Korbin will proceed to treatment today as scheduled for day 8 cycle 1, paclitaxel alone. I will see her again in one week on February 10 for day 15, again paclitaxel alone. When she returns 2 weeks from now February 17 she will be due for day 1 cycle 2 consisting of all 3 agents. Again, our goal is to complete a total of 12 weekly doses of paclitaxel, with the every 3 week anti-HER-2/neu therapy to be continued for total of one year. Her next echocardiogram will be due in late April.  I am increasing Camila's dose of omeprazole from 20 mg to 40 mg daily, and I have since that prescription to her pharmacy today. Also asked that she contact us if she has another severe episode of diarrhea like she had last Friday. At that point we might want to collect a stool specimen for further testing, although her symptoms are not particularly consistent with C. difficile infection. I think this is more likely to be chemotherapy/drug related since it didn't clear so easily and is currently not a problem, 1 week out from therapy.  Cherese and her husband both voice understanding and  agreement with this plan. They know to call with any changes or problems prior to her next scheduled appointment.   Reshanda Lewey, PA-C   09/27/2013 9:45 AM

## 2013-09-27 NOTE — Telephone Encounter (Signed)
Per PA-C request. Called pt to inform her of K+ Levels and to decrease potassium to 10 mEq twice a day until otherwise notified by provider. Message to be forwarded to [employer], PA-C.

## 2013-09-27 NOTE — Patient Instructions (Signed)
[facility] Discharge Instructions for Patients Receiving Chemotherapy  Today you received the following chemotherapy agents:  Taxol  To help prevent nausea and vomiting after your treatment, we encourage you to take your nausea medication as ordered per MD.   If you develop nausea and vomiting that is not controlled by your nausea medication, call the clinic.   BELOW ARE SYMPTOMS THAT SHOULD BE REPORTED IMMEDIATELY:  *FEVER GREATER THAN 100.5 F  *CHILLS WITH OR WITHOUT FEVER  NAUSEA AND VOMITING THAT IS NOT CONTROLLED WITH YOUR NAUSEA MEDICATION  *UNUSUAL SHORTNESS OF BREATH  *UNUSUAL BRUISING OR BLEEDING  TENDERNESS IN MOUTH AND THROAT WITH OR WITHOUT PRESENCE OF ULCERS  *URINARY PROBLEMS  *BOWEL PROBLEMS  UNUSUAL RASH Items with * indicate a potential emergency and should be followed up as soon as possible.  Feel free to call the clinic you have any questions or concerns. The clinic phone number is [phone].    

## 2013-10-04 ENCOUNTER — Ambulatory Visit (HOSPITAL_BASED_OUTPATIENT_CLINIC_OR_DEPARTMENT_OTHER): Payer: Medicare Other

## 2013-10-04 ENCOUNTER — Ambulatory Visit (HOSPITAL_BASED_OUTPATIENT_CLINIC_OR_DEPARTMENT_OTHER): Payer: Medicare Other | Admitting: Physician Assistant

## 2013-10-04 ENCOUNTER — Other Ambulatory Visit (HOSPITAL_BASED_OUTPATIENT_CLINIC_OR_DEPARTMENT_OTHER): Payer: Medicare Other

## 2013-10-04 ENCOUNTER — Encounter: Payer: Self-pay | Admitting: Physician Assistant

## 2013-10-04 VITALS — BP 126/74 | HR 99 | Temp 98.3°F | Resp 18 | Ht 64.0 in | Wt 176.7 lb

## 2013-10-04 DIAGNOSIS — Z5111 Encounter for antineoplastic chemotherapy: Secondary | ICD-10-CM

## 2013-10-04 DIAGNOSIS — R197 Diarrhea, unspecified: Secondary | ICD-10-CM | POA: Insufficient documentation

## 2013-10-04 DIAGNOSIS — C50519 Malignant neoplasm of lower-outer quadrant of unspecified female breast: Secondary | ICD-10-CM

## 2013-10-04 DIAGNOSIS — C9110 Chronic lymphocytic leukemia of B-cell type not having achieved remission: Secondary | ICD-10-CM

## 2013-10-04 DIAGNOSIS — D649 Anemia, unspecified: Secondary | ICD-10-CM

## 2013-10-04 DIAGNOSIS — C50511 Malignant neoplasm of lower-outer quadrant of right female breast: Secondary | ICD-10-CM

## 2013-10-04 DIAGNOSIS — C50919 Malignant neoplasm of unspecified site of unspecified female breast: Secondary | ICD-10-CM

## 2013-10-04 DIAGNOSIS — C50911 Malignant neoplasm of unspecified site of right female breast: Secondary | ICD-10-CM

## 2013-10-04 DIAGNOSIS — E876 Hypokalemia: Secondary | ICD-10-CM

## 2013-10-04 LAB — COMPREHENSIVE METABOLIC PANEL (CC13)
ALK PHOS: 97 U/L (ref 40–150)
ALT: 45 U/L (ref 0–55)
ANION GAP: 12 meq/L — AB (ref 3–11)
AST: 23 U/L (ref 5–34)
Albumin: 4.5 g/dL (ref 3.5–5.0)
BILIRUBIN TOTAL: 0.22 mg/dL (ref 0.20–1.20)
BUN: 19.9 mg/dL (ref 7.0–26.0)
CO2: 23 mEq/L (ref 22–29)
CREATININE: 1.1 mg/dL (ref 0.6–1.1)
Calcium: 10.1 mg/dL (ref 8.4–10.4)
Chloride: 106 mEq/L (ref 98–109)
Glucose: 97 mg/dl (ref 70–140)
Potassium: 4.4 mEq/L (ref 3.5–5.1)
SODIUM: 141 meq/L (ref 136–145)
TOTAL PROTEIN: 7.2 g/dL (ref 6.4–8.3)

## 2013-10-04 LAB — CBC WITH DIFFERENTIAL/PLATELET
BASO%: 0.7 % (ref 0.0–2.0)
BASOS ABS: 0.1 10*3/uL (ref 0.0–0.1)
EOS%: 2.1 % (ref 0.0–7.0)
Eosinophils Absolute: 0.2 10*3/uL (ref 0.0–0.5)
HEMATOCRIT: 38.5 % (ref 34.8–46.6)
HEMOGLOBIN: 11.9 g/dL (ref 11.6–15.9)
LYMPH#: 5 10*3/uL — AB (ref 0.9–3.3)
LYMPH%: 59.3 % — AB (ref 14.0–49.7)
MCH: 25.8 pg (ref 25.1–34.0)
MCHC: 30.9 g/dL — AB (ref 31.5–36.0)
MCV: 83.3 fL (ref 79.5–101.0)
MONO#: 0.4 10*3/uL (ref 0.1–0.9)
MONO%: 4.7 % (ref 0.0–14.0)
NEUT#: 2.8 10*3/uL (ref 1.5–6.5)
NEUT%: 33.2 % — AB (ref 38.4–76.8)
PLATELETS: 338 10*3/uL (ref 145–400)
RBC: 4.62 10*6/uL (ref 3.70–5.45)
RDW: 15 % — ABNORMAL HIGH (ref 11.2–14.5)
WBC: 8.5 10*3/uL (ref 3.9–10.3)

## 2013-10-04 MED ORDER — HEPARIN SOD (PORK) LOCK FLUSH 100 UNIT/ML IV SOLN
500.0000 [IU] | Freq: Once | INTRAVENOUS | Status: AC | PRN
  Administered 2013-10-04: 500 [IU]
  Filled 2013-10-04: qty 5

## 2013-10-04 MED ORDER — DEXAMETHASONE SODIUM PHOSPHATE 20 MG/5ML IJ SOLN
INTRAMUSCULAR | Status: AC
  Filled 2013-10-04: qty 5

## 2013-10-04 MED ORDER — SODIUM CHLORIDE 0.9 % IV SOLN
Freq: Once | INTRAVENOUS | Status: AC
  Administered 2013-10-04: 11:00:00 via INTRAVENOUS

## 2013-10-04 MED ORDER — ONDANSETRON 8 MG/NS 50 ML IVPB
INTRAVENOUS | Status: AC
  Filled 2013-10-04: qty 8

## 2013-10-04 MED ORDER — DIPHENHYDRAMINE HCL 50 MG/ML IJ SOLN
25.0000 mg | Freq: Once | INTRAMUSCULAR | Status: AC
  Administered 2013-10-04: 25 mg via INTRAVENOUS

## 2013-10-04 MED ORDER — DIPHENHYDRAMINE HCL 50 MG/ML IJ SOLN
INTRAMUSCULAR | Status: AC
  Filled 2013-10-04: qty 1

## 2013-10-04 MED ORDER — FAMOTIDINE IN NACL 20-0.9 MG/50ML-% IV SOLN
INTRAVENOUS | Status: AC
  Filled 2013-10-04: qty 50

## 2013-10-04 MED ORDER — FAMOTIDINE IN NACL 20-0.9 MG/50ML-% IV SOLN
20.0000 mg | Freq: Once | INTRAVENOUS | Status: AC
  Administered 2013-10-04: 20 mg via INTRAVENOUS

## 2013-10-04 MED ORDER — SODIUM CHLORIDE 0.9 % IV SOLN
80.0000 mg/m2 | Freq: Once | INTRAVENOUS | Status: AC
  Administered 2013-10-04: 156 mg via INTRAVENOUS
  Filled 2013-10-04: qty 26

## 2013-10-04 MED ORDER — SODIUM CHLORIDE 0.9 % IJ SOLN
10.0000 mL | INTRAMUSCULAR | Status: DC | PRN
  Administered 2013-10-04: 10 mL
  Filled 2013-10-04: qty 10

## 2013-10-04 MED ORDER — ONDANSETRON 8 MG/50ML IVPB (CHCC)
8.0000 mg | Freq: Once | INTRAVENOUS | Status: AC
  Administered 2013-10-04: 8 mg via INTRAVENOUS

## 2013-10-04 MED ORDER — DEXAMETHASONE SODIUM PHOSPHATE 20 MG/5ML IJ SOLN
20.0000 mg | Freq: Once | INTRAMUSCULAR | Status: AC
  Administered 2013-10-04: 20 mg via INTRAVENOUS

## 2013-10-04 MED ORDER — CHOLESTYRAMINE 4 G PO PACK: 4.0000 g | PACK | Freq: Two times a day (BID) | ORAL | Status: DC | PRN

## 2013-10-04 NOTE — Progress Notes (Signed)
ID: [name] OB: 13-Jun-1946  MR#: [id number]  YQI#:[id number]  PCP: [name], MD GYN:  M. Edwinna Areola SU: Donnie Mesa, Lowella Bandy,  OTHER MD: Christene Slates, Dian Situ, Brien Few  CHIEF COMPLAINT:  Right Breast Cancer  HISTORY OF PRESENT ILLNESS: [name] [name] has a history of breast cancer dating back to 1997, [name] [name] performed a right lumpectomy and axillary lymph node dissection for what according to the patient and her family was a stage I invasive ductal breast cancer. She received radiation adjuvantly and then took tamoxifen for 5 years  In February of 2014 she had a normal screening mammography, but in November 2014 she felt a "hard lump" in her right breast. She brought this to the attention of her urologist, Dr. Izora Gala, and he set her up for bilateral diagnostic mammography with mammography at [facility] 07/13/2013. This showed her breast density to be category B. There was no mammographic abnormality noted but ultrasonography showed a 1.4 cm irregularly shaped solid mass in the right breast at the 8:00 position. This was biopsied the same day, and showed ([id number] an invasive ductal carcinoma, grade 2, estrogen receptor 100% positive, progesterone receptor 13% positive, with an MIB-1 of 33% and HER-2 amplification with a HER-2: CEP 17 ratio of 3.19, and a HER-2 copy number percent all of 4.15.  The patient's subsequent history is as detailed below  INTERVAL HISTORY: [name] [name] returns alone today for followup of her right breast cancer.  She is due for day 15 cycle 1 of 4 planned cycles of paclitaxel given with trastuzumab/pertuzumab.  She receives paclitaxel weekly, with  trastuzumab/pertuzumab given every 3 weeks.  She'll receive a total of 12 weekly doses of paclitaxel (a total of 4 "cycles"), and will continue HER-2/neu therapy for total of one year. She is due for paclitaxel alone today.   [name] [name] is feeling well today, but again had problems  with diarrhea 4-6 days following the infusion. This tends to occur over the weekend. She averages 4-5 loose stools daily. Finally, on Sunday, she took a dose of Imodium and the diarrhea seems to have resolved. She has only brief episodes of abdominal cramps with the bowel movements, but no additional pain. She's had no bladder me this in the stool. She denies any fevers, chills, or night sweats. She did have one episode of nausea and emesis on Saturday which was directly associated with diarrhea. She's had no additional problems with nausea otherwise.  REVIEW OF SYSTEMS: [name] continues to have hot flashes.  She often has difficulty sleeping. She complains of continued fatigue. She's had no skin changes or rashes and denies any abnormal bruises or bleeding. She's had no mouth ulcers or oral sensitivity. She is on omeprazole 40 mg daily and feels like her reflux has improved. She's had no change in urinary habits. She has some postnasal drip. She has an occasional dry cough, occasional shortness of breath with exertion, but denies any orthopnea, chest pain, or palpitations. She's had no abnormal headaches or dizziness and denies any change in vision. Currently she denies any unusual myalgias, arthralgias, bony pain, peripheral swelling, or signs of peripheral neuropathy.  A detailed review of systems is otherwise stable and noncontributory.   PAST MEDICAL HISTORY: Past Medical History  Diagnosis Date  . Sinus tachycardia     mild resting  . HLD (hyperlipidemia)   . Orthostatic hypotension     slight in the past  . Anxiety   . Depression   .  Diverticulosis   . Fibromyalgia 1978  . Hypothyroidism   . Alopecia   . Ruptured disk     one in neck and two in back  . Plantar fasciitis   . GERD (gastroesophageal reflux disease)   . History of syncope     episode 2008--  no recurrence since  . Ureteral calculi     bilateral  . Leukocytosis   . Chronic back pain     "lower back and upper neck"  (07/28/2013)  . 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 ear   . Urgency of urination   . Hematuria   . Sensation of pressure in bladder area   . Wears glasses   . Ecchymosis     FROM IV AND LAB WORK THE PAST WEEK  06-17-2013  . Stool incontinence     "at times recently"   . Kidney stones 2014  . CAP (community acquired pneumonia)     admission 06-04-2013 secondary to failed oral antibitotics---  last cxr 06-16-2013  much improved  . SOB (shortness of breath) 06-17-2013 CURRENTLY RESIDUAL FROM RECENT CAP    SOB from obesity and deconditioning 2009- eval included CPX  . Arthritis     "spine" (07/28/2013)  . DDD (degenerative disc disease)   . CLL (chronic lymphocytic leukemia) dx'd 05/2013    "I don't think I have it though" (07/28/2013)  . Breast cancer 1997; 2014    right    PAST SURGICAL HISTORY: Past Surgical History  Procedure Laterality Date  . Lumbar epidural injection      has had 7 injections  . Total abdominal hysterectomy w/ bilateral salpingoophorectomy  1997  . Breast lumpectomy with axillary lymph node dissection Right 1997  . Transthoracic echocardiogram  05-11-2007  dr Ron Parker    normal lvf/  ef 65%  . Tonsillectomy  AGE 48  . Retinal detachment surgery Right 2013  . Cystoscopy with retrograde pyelogram, ureteroscopy and stent placement Bilateral 06/17/2013    Procedure: CYSTOSCOPY WITH RETROGRADE PYELOGRAM, URETEROSCOPY AND LEFT DOUBLE  J STENT PLACEMENT RIGHT URETERAL HOLMIIUM LASER AND DOUBLE J STENT ;  Surgeon: Hanley Ben, MD;  Location: [facility];  Service: Urology;  Laterality: Bilateral;  . Holmium laser application Bilateral 16/05/9603    Procedure: HOLMIUM LASER APPLICATION;  Surgeon: Hanley Ben, MD;  Location: [facility];  Service: Urology;  Laterality: Bilateral;  . Mastectomy w/ nodes partial Right 1997  . Mastectomy complete / simple Right 07/28/2013  . Appendectomy  1997  . Breast biopsy Right 2014  . Cystoscopy with ureteroscopy,  stone basketry and stent placement Bilateral 2014  . Lithotripsy Left ~ 06/2013  . Simple mastectomy with axillary sentinel node biopsy Right 07/28/2013    Procedure: RIGHT TOTAL  MASTECTOMY;  Surgeon: Imogene Burn. Georgette Dover, MD;  Location: [facility];  Service: General;  Laterality: Right;  . Portacath placement Left 07/28/2013    Procedure: ATTEMPTED INSERTION PORT-A-CATH;  Surgeon: Imogene Burn. Georgette Dover, MD;  Location: [facility];  Service: General;  Laterality: Left;    FAMILY HISTORY Family History  Problem Relation Age of Onset  . Heart disease Maternal Grandfather   . Diabetes Maternal Grandfather   . Colon cancer Maternal Aunt   . Other Paternal Grandmother     brain tumor  . Dementia Mother   . Diabetes Mother   . Osteoporosis Mother   . Diabetes Maternal Aunt   . Prostate cancer Father   . Brain cancer Maternal Grandmother   . Bipolar disorder Maternal Aunt   .  Stroke Maternal Aunt    the patient's mother died at the age of 58. The patient's father is alive at age 18. She had no brothers or sisters. Her father has a history of prostate cancer. There is no history of breast or ovarian cancer in the family. One maternal first cousin has a history of Hodgkin's lymphoma.  GYNECOLOGIC HISTORY:  Menarche age 89, first live birth age 43. She is GX P1. She underwent total abdominal hysterectomy and bilateral salpingo-oophorectomy in 1997 she did not take hormone replacement.  SOCIAL HISTORY: (Updated January 2015) She is a Agricultural engineer. Her husband Arnette Norris farms approximately 500 acres. 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 The patient has no grandchildren. She has two "grand cats". She attends a local united church of [place]: Not in place   HEALTH MAINTENANCE:  (Updated January 2015) History  Substance Use Topics  . Smoking status: Never Smoker   . Smokeless tobacco: Never Used  . Alcohol Use: Yes      Comment: 07/28/2013 "no alcohol since 1994; never had problem w/it"     Colonoscopy: 2009/ [facility]  PAP: Status post hysterectomy  Bone density: May 10/14/2009 at [facility] was normal  Lipid panel:  Not on file   Allergies  Allergen Reactions  . Amoxicillin Itching  . Lasix [Furosemide] Nausea And Vomiting and Other (See Comments)    headache  . Lithium Other (See Comments)    Dizziness, "cause me to fall"  . Sulfa Antibiotics Hives  . Trazodone And Nefazodone Other (See Comments)    insomnia  . Lyrica [Pregabalin] Diarrhea, Nausea Only and Rash    Current Outpatient Prescriptions  Medication Sig Dispense Refill  . ALPRAZolam (XANAX) 0.5 MG tablet TAKE 1 TABLET BY MOUTH EVERY 8 HOURS AS NEEDED  30 tablet  0  . amitriptyline (ELAVIL) 150 MG tablet Take 150 mg by mouth at bedtime.      . Ascorbic Acid (VITAMIN C) 1000 MG tablet Take 1,000 mg by mouth daily.      Marland Kitchen buPROPion (WELLBUTRIN SR) 150 MG 12 hr tablet Take 150-300 mg by mouth 2 (two) times daily. Take 357m in the morning and 1522mat 2pm      . cetirizine (ZYRTEC) 10 MG tablet Take 10 mg by mouth daily.      . Cholecalciferol (VITAMIN D-3) 1000 UNITS CAPS Take 5,000 Units by mouth daily.      . Marland Kitchenevothyroxine (SYNTHROID, LEVOTHROID) 50 MCG tablet Take 50 mcg by mouth daily before breakfast.      . lidocaine-prilocaine (EMLA) cream Apply 1 application topically as needed.  30 g  3  . metoprolol succinate (TOPROL-XL) 25 MG 24 hr tablet Take 25 mg by mouth daily.      . minoxidil (ROGAINE) 2 % external solution Apply 1 application topically 2 (two) times daily as needed.       . Multiple Vitamins-Minerals (HAIR VITAMINS PO) Take 1 tablet by mouth 2 (two) times daily.      . Omega-3 Fatty Acids (FISH OIL PO) Take 1 capsule by mouth daily.       . Marland Kitchenmeprazole (PRILOSEC) 40 MG capsule Take 1 capsule (40 mg total) by mouth every morning.  30 capsule  3  . Oxcarbazepine (TRILEPTAL) 300 MG tablet Take 300 mg by mouth 2 (two) times  daily.      . Marland KitchenxyCODONE-acetaminophen (PERCOCET/ROXICET) 5-325 MG per tablet Take 1 tablet by  mouth every 4 (four) hours as needed for moderate pain.  40 tablet  0  . potassium chloride (MICRO-K) 10 MEQ CR capsule 2 tabs by mouth in the morning and 1 tab by mouth in the evening  90 capsule  3  . Vilazodone HCl (VIIBRYD) 20 MG TABS Take 1 tablet by mouth daily.      . vitamin B-12 (CYANOCOBALAMIN) 1000 MCG tablet Take 1,000 mcg by mouth daily.      . cholestyramine (QUESTRAN) 4 G packet Take 1 packet (4 g total) by mouth 2 (two) times daily as needed (diarrhea). Take with meals.  40 each  1  . ibuprofen (ADVIL,MOTRIN) 200 MG tablet Take 800 mg by mouth every 6 (six) hours as needed for moderate pain.      Marland Kitchen ondansetron (ZOFRAN) 8 MG tablet 1 tab by mouth at dinner on day of chemo, then 1 tab with breakfast and dinner on day after chemo.  Then 1 tab every 12 hrs PRN nausea  30 tablet  1  . prochlorperazine (COMPAZINE) 10 MG tablet Take 1 tablet (10 mg total) by mouth 4 (four) times daily -  before meals and at bedtime. Take before meals and at bedtime starting before supper  the evening of chemo and copntinue through the next day. Then take AS NEEDED before meals and at bedtime  30 tablet  1   No current facility-administered medications for this visit.   Facility-Administered Medications Ordered in Other Visits  Medication Dose Route Frequency Provider Last Rate Last Dose  . famotidine (PEPCID) IVPB 20 mg  20 mg Intravenous Once Chauncey Cruel, MD      . heparin lock flush 100 unit/mL  500 Units Intracatheter Once PRN Chauncey Cruel, MD      . ondansetron (ZOFRAN) IVPB 8 mg  8 mg Intravenous Once Chauncey Cruel, MD   8 mg at 10/04/13 1118  . PACLitaxel (TAXOL) 156 mg in sodium chloride 0.9 % 250 mL chemo infusion (</= 60m/m2)  80 mg/m2 (Treatment Plan Actual) Intravenous Once GChauncey Cruel MD      . sodium chloride 0.9 % injection 10 mL  10 mL Intracatheter PRN GChauncey Cruel MD         OBJECTIVE: [age]year old white woman in no acute distress Filed Vitals:   10/04/13 0950  BP: 126/74  Pulse: 99  Temp: 98.3 F (36.8 C)  Resp: 18     Body mass index is 30.32 kg/(m^2).    ECOG FS:1 - Symptomatic but completely ambulatory Filed Weights   10/04/13 0950  Weight: 176 lb 11.2 oz (80.151 kg)   Physical Exam: HEENT:  Sclerae anicteric.  Oropharynx clear and moist. No ulcerations.  No oropharyngeal candidiasis. Neck is supple, trachea midline. No thyromegaly NODES:  No cervical or supraclavicular lymphadenopathy palpated.  BREAST EXAM: Deferred. Axillae are benign bilaterally, with no palpable lymphadenopathy. LUNGS:  Clear to auscultation bilaterally with good excursion.  No wheezes, rales, or rhonchi HEART:  Regular rate and rhythm. No murmur appreciated. ABDOMEN:  Soft, nontender to palpation. No organomegaly or masses palpated. Positive bowel sounds.  MSK:  No focal spinal tenderness to palpation. Good range of motion bilaterally in the upper extremities. EXTREMITIES:  No peripheral edema.   SKIN:  Benign with no visible rashes or skin lesions. No nail dyscrasia. No ecchymoses or petechiae. No pallor. NEURO:  Nonfocal. Well oriented. Appropriate affect.     LAB RESULTS:   Lab Results  Component Value Date  WBC 8.5 10/04/2013   NEUTROABS 2.8 10/04/2013   HGB 11.9 10/04/2013   HCT 38.5 10/04/2013   MCV 83.3 10/04/2013   PLT 338 10/04/2013      Chemistry      Component Value Date/Time   NA 141 10/04/2013 0939   NA 145 07/30/2013 0422   K 4.4 10/04/2013 0939   K 3.9 07/30/2013 0422   CL 111 07/30/2013 0422   CO2 23 10/04/2013 0939   CO2 25 07/30/2013 0422   BUN 19.9 10/04/2013 0939   BUN 6 07/30/2013 0422   CREATININE 1.1 10/04/2013 0939   CREATININE 0.76 07/30/2013 0422      Component Value Date/Time   CALCIUM 10.1 10/04/2013 0939   CALCIUM 8.3* 07/30/2013 0422   ALKPHOS 97 10/04/2013 0939   ALKPHOS 196* 06/04/2013 2221   AST 23 10/04/2013 0939   AST 30  06/04/2013 2221   ALT 45 10/04/2013 0939   ALT 45* 06/04/2013 2221   BILITOT 0.22 10/04/2013 0939   BILITOT 0.3 06/04/2013 2221      STUDIES:  An echocardiogram on 08/23/2013 showed an ejection fraction of 55-60%.  Due to be repeated in late April, 3 months after initiating therapy.   ASSESSMENT: 68 y.o. [place] woman  (1) status post right lumpectomy and axillary lymph node dissection in 1997 for a stage I breast cancer, treated with adjuvant radiation and tamoxifen for 5 years  (2) status post right breast biopsy 07/13/2013 for a clinical T1c N0, stage IA invasive ductal carcinoma, grade 2, estrogen receptor 100% positive, progesterone receptor 13% positive, with an MIB-1 of 33%, and HER-2 amplification by CISH with a HER2/CEP 17 ratio of 3.19, and an average HER-2 copy number per cell of 4.15  (3) status post right mastectomy 07/28/2013 for a pT1c pN0, stage IA invasive ductal carcinoma, grade 3, with close but negative margins. Prognostic panel was not repeated  (4) to receive weekly paclitaxel x12 with trastuzumab/ pertuzumab every 3 weeks x one year starting 09/20/2048   OTHER PROBLEMS:  (a) the patient has a history of chronic lymphoid leukemia diagnosed by flow cytometry 06/29/2013, the cells being CD5, CD20 and CD23 positive, CD10 negative. This requires only followup.  (b) anemia with a normal MCV and normal ferritin  (c) genetics testing pending  (d) the patient may consider eventual reconstruction   PLAN: Bayleigh will proceed to treatment today as scheduled for day 15 cycle 1, paclitaxel alone. With the exception of the diarrhea, she is really tolerating treatment well. We'll continue to follow the diarrhea closely, and certainly if it worsens, she has increased abdominal pain, or begins having fever, she will let us know.   Her diarrhea does seem thus far to occur consistently 4-6 days after her infusion, then resolves, so I think this is going to be treatment  related and is unlikely to be infectious. I have prescribed Questran to be used with meals up to twice daily for diarrhea. Since the diarrhea tends to start around Friday after being treated on Tuesday, I suggested that she begin using the [place] with breakfast on Friday mornings and see if this helps with future treatments.  Forrest will return next week on February 17 anticipation of day 1 cycle 2 and will be due for all 3 agents that day (paclitaxel/trastuzumab/pertuzumab). We will continue to follow her weekly with repeat labs and physical exam.  Our goal is to complete a total of 12 weekly doses of paclitaxel, with the every 3 week anti-HER-2/neu  therapy being continued for total of one year. Her next echocardiogram will be due in late April.  Aarica voices her understanding and agreement with this plan. She knows to call with any changes or problems prior to her next scheduled appointment.   Jeffrey Voth, PA-C   10/04/2013 11:19 AM

## 2013-10-04 NOTE — Patient Instructions (Signed)
[facility] Discharge Instructions for Patients Receiving Chemotherapy  Today you received the following chemotherapy agents Taxol.   To help prevent nausea and vomiting after your treatment, we encourage you to take your nausea medication as prescribed.    If you develop nausea and vomiting that is not controlled by your nausea medication, call the clinic.   BELOW ARE SYMPTOMS THAT SHOULD BE REPORTED IMMEDIATELY:  *FEVER GREATER THAN 100.5 F  *CHILLS WITH OR WITHOUT FEVER  NAUSEA AND VOMITING THAT IS NOT CONTROLLED WITH YOUR NAUSEA MEDICATION  *UNUSUAL SHORTNESS OF BREATH  *UNUSUAL BRUISING OR BLEEDING  TENDERNESS IN MOUTH AND THROAT WITH OR WITHOUT PRESENCE OF ULCERS  *URINARY PROBLEMS  *BOWEL PROBLEMS  UNUSUAL RASH Items with * indicate a potential emergency and should be followed up as soon as possible.  Feel free to call the clinic should you have any questions or concerns. The clinic phone number is [phone].  It was my pleasure to take care of you today!  Leeanne Rio, RN

## 2013-10-11 ENCOUNTER — Ambulatory Visit (HOSPITAL_BASED_OUTPATIENT_CLINIC_OR_DEPARTMENT_OTHER): Payer: Medicare Other

## 2013-10-11 ENCOUNTER — Ambulatory Visit (HOSPITAL_BASED_OUTPATIENT_CLINIC_OR_DEPARTMENT_OTHER): Payer: Medicare Other | Admitting: Hematology and Oncology

## 2013-10-11 ENCOUNTER — Other Ambulatory Visit (HOSPITAL_BASED_OUTPATIENT_CLINIC_OR_DEPARTMENT_OTHER): Payer: Medicare Other

## 2013-10-11 ENCOUNTER — Encounter (INDEPENDENT_AMBULATORY_CARE_PROVIDER_SITE_OTHER): Payer: Self-pay

## 2013-10-11 VITALS — BP 113/64 | HR 89 | Temp 98.3°F | Resp 18 | Ht 64.0 in | Wt 179.4 lb

## 2013-10-11 DIAGNOSIS — C50519 Malignant neoplasm of lower-outer quadrant of unspecified female breast: Secondary | ICD-10-CM

## 2013-10-11 DIAGNOSIS — L851 Acquired keratosis [keratoderma] palmaris et plantaris: Secondary | ICD-10-CM

## 2013-10-11 DIAGNOSIS — C50511 Malignant neoplasm of lower-outer quadrant of right female breast: Secondary | ICD-10-CM

## 2013-10-11 DIAGNOSIS — E876 Hypokalemia: Secondary | ICD-10-CM

## 2013-10-11 DIAGNOSIS — C50911 Malignant neoplasm of unspecified site of right female breast: Secondary | ICD-10-CM

## 2013-10-11 DIAGNOSIS — C50919 Malignant neoplasm of unspecified site of unspecified female breast: Secondary | ICD-10-CM

## 2013-10-11 DIAGNOSIS — D649 Anemia, unspecified: Secondary | ICD-10-CM

## 2013-10-11 DIAGNOSIS — Z5112 Encounter for antineoplastic immunotherapy: Secondary | ICD-10-CM

## 2013-10-11 DIAGNOSIS — C9110 Chronic lymphocytic leukemia of B-cell type not having achieved remission: Secondary | ICD-10-CM

## 2013-10-11 LAB — COMPREHENSIVE METABOLIC PANEL (CC13)
ALT: 31 U/L (ref 0–55)
ANION GAP: 9 meq/L (ref 3–11)
AST: 21 U/L (ref 5–34)
Albumin: 3.9 g/dL (ref 3.5–5.0)
Alkaline Phosphatase: 77 U/L (ref 40–150)
BUN: 9.9 mg/dL (ref 7.0–26.0)
CHLORIDE: 110 meq/L — AB (ref 98–109)
CO2: 23 meq/L (ref 22–29)
CREATININE: 0.8 mg/dL (ref 0.6–1.1)
Calcium: 9.4 mg/dL (ref 8.4–10.4)
GLUCOSE: 102 mg/dL (ref 70–140)
Potassium: 4 mEq/L (ref 3.5–5.1)
Sodium: 142 mEq/L (ref 136–145)
Total Bilirubin: 0.22 mg/dL (ref 0.20–1.20)
Total Protein: 6.2 g/dL — ABNORMAL LOW (ref 6.4–8.3)

## 2013-10-11 LAB — CBC WITH DIFFERENTIAL/PLATELET
BASO%: 0.6 % (ref 0.0–2.0)
BASOS ABS: 0.1 10*3/uL (ref 0.0–0.1)
EOS ABS: 0.1 10*3/uL (ref 0.0–0.5)
EOS%: 1.7 % (ref 0.0–7.0)
HEMATOCRIT: 34.4 % — AB (ref 34.8–46.6)
HEMOGLOBIN: 10.8 g/dL — AB (ref 11.6–15.9)
LYMPH#: 5.7 10*3/uL — AB (ref 0.9–3.3)
LYMPH%: 66.9 % — AB (ref 14.0–49.7)
MCH: 26.2 pg (ref 25.1–34.0)
MCHC: 31.4 g/dL — ABNORMAL LOW (ref 31.5–36.0)
MCV: 83.3 fL (ref 79.5–101.0)
MONO#: 0.4 10*3/uL (ref 0.1–0.9)
MONO%: 4.1 % (ref 0.0–14.0)
NEUT#: 2.3 10*3/uL (ref 1.5–6.5)
NEUT%: 26.7 % — AB (ref 38.4–76.8)
PLATELETS: 261 10*3/uL (ref 145–400)
RBC: 4.13 10*6/uL (ref 3.70–5.45)
RDW: 15.8 % — ABNORMAL HIGH (ref 11.2–14.5)
WBC: 8.5 10*3/uL (ref 3.9–10.3)

## 2013-10-11 MED ORDER — ONDANSETRON 8 MG/50ML IVPB (CHCC)
8.0000 mg | Freq: Once | INTRAVENOUS | Status: AC
  Administered 2013-10-11: 8 mg via INTRAVENOUS

## 2013-10-11 MED ORDER — FAMOTIDINE IN NACL 20-0.9 MG/50ML-% IV SOLN
INTRAVENOUS | Status: AC
  Filled 2013-10-11: qty 50

## 2013-10-11 MED ORDER — TRASTUZUMAB CHEMO INJECTION 440 MG
6.0000 mg/kg | Freq: Once | INTRAVENOUS | Status: AC
  Administered 2013-10-11: 483 mg via INTRAVENOUS
  Filled 2013-10-11: qty 23

## 2013-10-11 MED ORDER — ACETAMINOPHEN 325 MG PO TABS
650.0000 mg | ORAL_TABLET | Freq: Once | ORAL | Status: AC
  Administered 2013-10-11: 650 mg via ORAL

## 2013-10-11 MED ORDER — SODIUM CHLORIDE 0.9 % IJ SOLN
10.0000 mL | INTRAMUSCULAR | Status: DC | PRN
  Administered 2013-10-11: 10 mL
  Filled 2013-10-11: qty 10

## 2013-10-11 MED ORDER — DIPHENHYDRAMINE HCL 50 MG/ML IJ SOLN
25.0000 mg | Freq: Once | INTRAMUSCULAR | Status: AC
  Administered 2013-10-11: 25 mg via INTRAVENOUS

## 2013-10-11 MED ORDER — PACLITAXEL CHEMO INJECTION 300 MG/50ML
80.0000 mg/m2 | Freq: Once | INTRAVENOUS | Status: AC
  Administered 2013-10-11: 156 mg via INTRAVENOUS
  Filled 2013-10-11: qty 26

## 2013-10-11 MED ORDER — DEXAMETHASONE SODIUM PHOSPHATE 20 MG/5ML IJ SOLN
20.0000 mg | Freq: Once | INTRAMUSCULAR | Status: AC
  Administered 2013-10-11: 20 mg via INTRAVENOUS

## 2013-10-11 MED ORDER — ACETAMINOPHEN 325 MG PO TABS
ORAL_TABLET | ORAL | Status: AC
  Filled 2013-10-11: qty 2

## 2013-10-11 MED ORDER — DEXAMETHASONE SODIUM PHOSPHATE 20 MG/5ML IJ SOLN
INTRAMUSCULAR | Status: AC
  Filled 2013-10-11: qty 5

## 2013-10-11 MED ORDER — SODIUM CHLORIDE 0.9 % IV SOLN
420.0000 mg | Freq: Once | INTRAVENOUS | Status: AC
  Administered 2013-10-11: 420 mg via INTRAVENOUS
  Filled 2013-10-11: qty 14

## 2013-10-11 MED ORDER — SODIUM CHLORIDE 0.9 % IV SOLN
Freq: Once | INTRAVENOUS | Status: AC
  Administered 2013-10-11: 11:00:00 via INTRAVENOUS

## 2013-10-11 MED ORDER — DIPHENHYDRAMINE HCL 50 MG/ML IJ SOLN
INTRAMUSCULAR | Status: AC
  Filled 2013-10-11: qty 1

## 2013-10-11 MED ORDER — HEPARIN SOD (PORK) LOCK FLUSH 100 UNIT/ML IV SOLN
500.0000 [IU] | Freq: Once | INTRAVENOUS | Status: AC | PRN
Start: 2013-10-11 — End: 2013-10-11
  Administered 2013-10-11: 500 [IU]
  Filled 2013-10-11: qty 5

## 2013-10-11 MED ORDER — ONDANSETRON 8 MG/NS 50 ML IVPB
INTRAVENOUS | Status: AC
  Filled 2013-10-11: qty 8

## 2013-10-11 MED ORDER — FAMOTIDINE IN NACL 20-0.9 MG/50ML-% IV SOLN
20.0000 mg | Freq: Once | INTRAVENOUS | Status: AC
  Administered 2013-10-11: 20 mg via INTRAVENOUS

## 2013-10-11 NOTE — Progress Notes (Signed)
ID: [name] [name] OB: 11-Nov-1945  MR#: [id number]  NOI#:[id number]  PCP: [name] Reichmann, MD GYN:  [name] [name] SU: [name] [name], [name] [name],  OTHER MD: [name] [name], [name] [name], [name] [name]  CHIEF COMPLAINT: For initiation of chemotherapy with Taxol, Herceptin and Perjeta   DIAGNOSIS: Right Breast Cancer  HISTORY OF PRESENT ILLNESS: As per previously documented note:  [name] [name] has a history of breast cancer dating back to 1997, [name] [name] [name] [name] performed a right lumpectomy and axillary lymph node dissection for what according to the patient and her family was a stage I invasive ductal breast cancer. She received radiation adjuvantly and then took tamoxifen for 5 years  In February of 2014 she had a normal screening mammography, but in November 2014 she felt a "hard lump" in her right breast. She brought this to the attention of her urologist, [name] [name], and he set her up for bilateral diagnostic mammography with mammography at [facility] 07/13/2013. This showed her breast density to be category B. There was no mammographic abnormality noted but ultrasonography showed a 1.4 cm irregularly shaped solid mass in the right breast at the 8:00 position. This was biopsied the same day, and showed ([id number]) an invasive ductal carcinoma, grade 2, estrogen receptor 100% positive, progesterone receptor 13% positive, with an MIB-1 of 33% and HER-2 amplification with a HER-2: CEP 17 ratio of 3.19, and a HER-2 copy number percent all of 4.15.  The patient's subsequent history is as detailed below  INTERVAL HISTORY: [name] [name] returns today with her husband for initiation of chemotherapy with cycle 4 of weekly Taxol and cycle 2 of  Herceptin and Perjeta for her right breast cancer. Since  10/08/2013 she noticed a skin rash on her both forearms. she says it is not itchy and it's not worsening and she  says she hasn't changed any  body lotions or soaps recently.  She says that her  diarrhea is controlled with the Imodium. She has [name] [name] as needed to take. She complains of dry skin and also she started having sensitivity in her nails. She says that she has some taste changes but she's not drinking enough fluids. Her appetite is good. Denies any fever, shortness of breath, chest pain, palpitations, blood in the stool, blood in the urine. Denies any blurred vision, headaches, dizziness. Her back pain is controlled with the pain medications  She does not complain of any nausea or vomiting  REVIEW OF SYSTEMS: A 14 point review of systems is been assessed and the pertinent findings are mentioned in interval history   PAST MEDICAL HISTORY: Past Medical History  Diagnosis Date  . Sinus tachycardia     mild resting  . HLD (hyperlipidemia)   . Orthostatic hypotension     slight in the past  . Anxiety   . Depression   . Diverticulosis   . Fibromyalgia 1978  . Hypothyroidism   . Alopecia   . Ruptured disk     one in neck and two in back  . Plantar fasciitis   . GERD (gastroesophageal reflux disease)   . History of syncope     episode 2008--  no recurrence since  . Ureteral calculi     bilateral  . Leukocytosis   . Chronic back pain     "lower back and upper neck" (07/28/2013)  . 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 ear   . Urgency of urination   . Hematuria   . Sensation of pressure in bladder area   .  Wears glasses   . Ecchymosis     FROM IV AND LAB WORK THE PAST WEEK  06-17-2013  . Stool incontinence     "at times recently"   . Kidney stones 2014  . CAP (community acquired pneumonia)     admission 06-04-2013 secondary to failed oral antibitotics---  last cxr 06-16-2013  much improved  . SOB (shortness of breath) 06-17-2013 CURRENTLY RESIDUAL FROM RECENT CAP    SOB from obesity and deconditioning 2009- eval included CPX  . Arthritis     "spine" (07/28/2013)  . DDD (degenerative disc disease)   . CLL (chronic lymphocytic leukemia) dx'd 05/2013    "I don't think I have it  though" (07/28/2013)  . Breast cancer 1997; 2014    right    PAST SURGICAL HISTORY: Past Surgical History  Procedure Laterality Date  . Lumbar epidural injection      has had 7 injections  . Total abdominal hysterectomy w/ bilateral salpingoophorectomy  1997  . Breast lumpectomy with axillary lymph node dissection Right 1997  . Transthoracic echocardiogram  05-11-2007  dr [name] [name]    normal lvf/  ef 65%  . Tonsillectomy  AGE 72  . Retinal detachment surgery Right 2013  . Cystoscopy with retrograde pyelogram, ureteroscopy and stent placement Bilateral 06/17/2013    Procedure: CYSTOSCOPY WITH RETROGRADE PYELOGRAM, URETEROSCOPY AND LEFT DOUBLE  J STENT PLACEMENT RIGHT URETERAL HOLMIIUM LASER AND DOUBLE J STENT ;  Surgeon: [name] Ben, MD;  Location: [place];  Service: Urology;  Laterality: Bilateral;  . Holmium laser application Bilateral 53/29/9242    Procedure: HOLMIUM LASER APPLICATION;  Surgeon: [name] Ben, MD;  Location: [place];  Service: Urology;  Laterality: Bilateral;  . Mastectomy w/ nodes partial Right 1997  . Mastectomy complete / simple Right 07/28/2013  . Appendectomy  1997  . Breast biopsy Right 2014  . Cystoscopy with ureteroscopy, stone basketry and stent placement Bilateral 2014  . Lithotripsy Left ~ 06/2013  . Simple mastectomy with axillary sentinel node biopsy Right 07/28/2013    Procedure: RIGHT TOTAL  MASTECTOMY;  Surgeon: [name] Burn. Georgette Dover, MD;  Location: [place];  Service: General;  Laterality: Right;  . Portacath placement Left 07/28/2013    Procedure: ATTEMPTED INSERTION PORT-A-CATH;  Surgeon: [name] Burn. Georgette Dover, MD;  Location: [place] [facility];  Service: General;  Laterality: Left;    FAMILY HISTORY Family History  Problem Relation Age of Onset  . Heart disease Maternal Grandfather   . Diabetes Maternal Grandfather   . Colon cancer Maternal Aunt   . Other Paternal Grandmother     brain tumor  . Dementia Mother   . Diabetes  Mother   . Osteoporosis Mother   . Diabetes Maternal Aunt   . Prostate cancer Father   . Brain cancer Maternal Grandmother   . Bipolar disorder Maternal Aunt   . Stroke Maternal Aunt    the patient's mother died at the age of 5. The patient's father is alive at age 36. She had no brothers or sisters. Her father has a history of prostate cancer. There is no history of breast or ovarian cancer in the family. One maternal first cousin has a history of Hodgkin's lymphoma.  GYNECOLOGIC HISTORY:  Menarche age 71, first live birth age 46. She is GX P1. She underwent total abdominal hysterectomy and bilateral salpingo-oophorectomy in 1997 she did not take hormone replacement.  SOCIAL HISTORY: (Updated January 2015) She is a Agricultural engineer. Her husband [name] [name] farms approximately 500 acres. Daughter  [name] [name] lives in [place] where she works as a Pension scheme manager for a drug company. The patient has no grandchildren. She has two "grand cats". She attends a local [employer] church of [place]: Not in place   HEALTH MAINTENANCE:  (Updated January 2015) History  Substance Use Topics  . Smoking status: Never Smoker   . Smokeless tobacco: Never Used  . Alcohol Use: Yes     Comment: 07/28/2013 "no alcohol since 1994; never had problem w/it"     Colonoscopy: 2009/ [facility]  PAP: Status post hysterectomy  Bone density: May 10/14/2009 at [facility] was normal  Lipid panel:  Not on file   Allergies  Allergen Reactions  . Amoxicillin Itching  . Lasix [Furosemide] Nausea And Vomiting and Other (See Comments)    headache  . Lithium Other (See Comments)    Dizziness, "cause me to fall"  . Sulfa Antibiotics Hives  . Trazodone And Nefazodone Other (See Comments)    insomnia  . Lyrica [Pregabalin] Diarrhea, Nausea Only and Rash    Current Outpatient Prescriptions  Medication Sig Dispense Refill  . ALPRAZolam (XANAX) 0.5 MG tablet TAKE 1 TABLET BY MOUTH EVERY 8 HOURS  AS NEEDED  30 tablet  0  . amitriptyline (ELAVIL) 150 MG tablet Take 150 mg by mouth at bedtime.      . Ascorbic Acid (VITAMIN C) 1000 MG tablet Take 1,000 mg by mouth daily.      Marland Kitchen buPROPion (WELLBUTRIN SR) 150 MG 12 hr tablet Take 150-300 mg by mouth 2 (two) times daily. Take 362m in the morning and 1524mat 2pm      . cetirizine (ZYRTEC) 10 MG tablet Take 10 mg by mouth daily.      . Cholecalciferol (VITAMIN D-3) 1000 UNITS CAPS Take 5,000 Units by mouth daily.      . Marland Kitchenbuprofen (ADVIL,MOTRIN) 200 MG tablet Take 800 mg by mouth every 6 (six) hours as needed for moderate pain.      . Marland Kitchenevothyroxine (SYNTHROID, LEVOTHROID) 50 MCG tablet Take 50 mcg by mouth daily before breakfast.      . lidocaine-prilocaine (EMLA) cream Apply 1 application topically as needed.  30 g  3  . metoprolol succinate (TOPROL-XL) 25 MG 24 hr tablet Take 25 mg by mouth daily.      . minoxidil (ROGAINE) 2 % external solution Apply 1 application topically 2 (two) times daily as needed.       . Multiple Vitamins-Minerals (HAIR VITAMINS PO) Take 1 tablet by mouth 2 (two) times daily.      . Omega-3 Fatty Acids (FISH OIL PO) Take 1 capsule by mouth daily.       . Marland Kitchenmeprazole (PRILOSEC) 40 MG capsule Take 1 capsule (40 mg total) by mouth every morning.  30 capsule  3  . Oxcarbazepine (TRILEPTAL) 300 MG tablet Take 300 mg by mouth 2 (two) times daily.      . Marland KitchenxyCODONE-acetaminophen (PERCOCET/ROXICET) 5-325 MG per tablet Take 1 tablet by mouth every 4 (four) hours as needed for moderate pain.  40 tablet  0  . potassium chloride (MICRO-K) 10 MEQ CR capsule 2 tabs by mouth in the morning and 1 tab by mouth in the evening  90 capsule  3  . Vilazodone HCl (VIIBRYD) 20 MG TABS Take 1 tablet by mouth daily.      . vitamin B-12 (CYANOCOBALAMIN) 1000 MCG tablet Take 1,000 mcg by mouth daily.      . cholestyramine (QUESTRAN) 4  G packet Take 1 packet (4 g total) by mouth 2 (two) times daily as needed (diarrhea). Take with meals.  40 each  1   . ondansetron (ZOFRAN) 8 MG tablet 1 tab by mouth at dinner on day of chemo, then 1 tab with breakfast and dinner on day after chemo.  Then 1 tab every 12 hrs PRN nausea  30 tablet  1  . prochlorperazine (COMPAZINE) 10 MG tablet Take 1 tablet (10 mg total) by mouth 4 (four) times daily -  before meals and at bedtime. Take before meals and at bedtime starting before supper  the evening of chemo and copntinue through the next day. Then take AS NEEDED before meals and at bedtime  30 tablet  1   No current facility-administered medications for this visit.    OBJECTIVE: 68 year old white woman in no acute distress Filed Vitals:   10/11/13 0935  BP: 113/64  Pulse: 89  Temp: 98.3 F (36.8 C)  Resp: 18     Body mass index is 30.78 kg/(m^2).    ECOG FS:1 - Symptomatic but completely ambulatory Filed Weights   10/11/13 0935  Weight: 179 lb 6.4 oz (81.375 kg)   Physical Exam: HEENT:  Sclerae anicteric, conjunctiva no fever, neck supple, no JVD, no thyromegaly. Head atraumatic normocephalic  Oropharynx clear and moist. No ulcerations.  No oropharyngeal candidiasis. 6 NODES:  No cervical or supraclavicular lymphadenopathy palpated.  BREAST EXAM: Right breast -status post mastectomy scar noted without any masses appreciated. Left breast no masses felt.  Axillae are benign bilaterally, with no palpable lymphadenopathy. LUNGS:  Clear to auscultation bilaterally with good excursion.  No wheezes, rales, or rhonchi HEART:  Regular rate and rhythm. No murmur appreciated. ABDOMEN:  Soft, nontender to palpation. No organomegaly or masses palpated. Positive bowel sounds.  MSK:  No focal spinal tenderness to palpation. Good range of motion bilaterally in the upper extremities. EXTREMITIES:  No peripheral edema, no cyanosis, no clubbing   SKIN: Positive for Dry skin. Patient does have a  macular skin rash in both the forearms  NEURO:  Nonfocal,Well oriented. Appropriate mood and affect.     LAB  RESULTS:   Lab Results  Component Value Date   WBC 8.5 10/11/2013   NEUTROABS 2.3 10/11/2013   HGB 10.8* 10/11/2013   HCT 34.4* 10/11/2013   MCV 83.3 10/11/2013   PLT 261 10/11/2013      Chemistry      Component Value Date/Time   NA 142 10/11/2013 0918   NA 145 07/30/2013 0422   K 4.0 10/11/2013 0918   K 3.9 07/30/2013 0422   CL 111 07/30/2013 0422   CO2 23 10/11/2013 0918   CO2 25 07/30/2013 0422   BUN 9.9 10/11/2013 0918   BUN 6 07/30/2013 0422   CREATININE 0.8 10/11/2013 0918   CREATININE 0.76 07/30/2013 0422      Component Value Date/Time   CALCIUM 9.4 10/11/2013 0918   CALCIUM 8.3* 07/30/2013 0422   ALKPHOS 77 10/11/2013 0918   ALKPHOS 196* 06/04/2013 2221   AST 21 10/11/2013 0918   AST 30 06/04/2013 2221   ALT 31 10/11/2013 0918   ALT 45* 06/04/2013 2221   BILITOT 0.22 10/11/2013 0918   BILITOT 0.3 06/04/2013 2221      STUDIES:  An echocardiogram on 08/23/2013 showed an ejection fraction of 55-60%.  Due to be repeated in late April, 3 months after initiating therapy.   ASSESSMENT: 68 y.o. [place] woman  (1) status post right  lumpectomy and axillary lymph node dissection in 1997 for a stage I breast cancer, treated with adjuvant radiation and tamoxifen for 5 years  (2) status post right breast biopsy 07/13/2013 for a clinical T1c N0, stage IA invasive ductal carcinoma, grade 2, estrogen receptor 100% positive, progesterone receptor 13% positive, with an MIB-1 of 33%, and HER-2 amplification by CISH with a HER2/CEP 17 ratio of 3.19, and an average HER-2 copy number per cell of 4.15  (3) status post right mastectomy 07/28/2013 for a pT1c pN0, stage IA invasive ductal carcinoma, grade 3, with close but negative margins. Prognostic panel was not repeated  (4) to receive weekly paclitaxel x12 with trastuzumab/ pertuzumab every 3 weeks x one year starting 09/20/2048   OTHER PROBLEMS:  (a) History of chronic lymphoid leukemia diagnosed by flow cytometry 06/29/2013, the cells  being CD5, CD20 and CD23 positive, CD10 negative. Her absolute lymphocyte count slightly increased from 5000 to  5700. We'll closely monitor lymphocyte count if needed, we will order the repeat peripheral blood flow cytometry probably in 3 months.  (b) Anemia with a normal MCV and normal ferritin  (c) genetics testing pending  (d) the patient may consider eventual reconstruction   PLAN: Ms. [name] [name] will proceed with  Herceptin/Perjeta  in conjunction with weekly Taxol today. Her labs are acceptable for treatment.  I have asked the patient to apply Vaseline skin cream to the hands and feet and ware cotton gloves and socks for her extremely dry skin. During daytime she can apply Aquaphor cream  Skin rash could be secondary to dry skin versus Perjeta. If the skin is gets worse after this treatment will hold next dose of perjeta  Continue biotin mouthwash every 4-6 hours as needed  Continue Imodium and Questran as needed for diarrhea. I have encouraged her to drink more fluids.   followup in 1 week for weekly Taxol, CBC, CMP and office visit. We'll also arrange for 1 L of IV fluids with next visit   She is planned to  complete a total of 12 weekly doses of paclitaxel, with the every 3 week anti-HER-2/neu therapy being continued for total of one year.   Her next echocardiogram will be due in late April.  [name] [name]  understood and in agreement with this plan. She knows to call with any changes or problems prior to her next scheduled appointment.   total time spent: 30 minutes  Wilmon Arms, MD   Medical oncology  10/11/2013 10:22 AM

## 2013-10-11 NOTE — Patient Instructions (Signed)
[name] [name] Discharge Instructions for Patients Receiving Chemotherapy  Today you received the following chemotherapy agents : Herceptin, Perjeta, and Taxol  To help prevent nausea and vomiting after your treatment, we encourage you to take your nausea medications as directed:  Zofran 8 mg tonight at dinner, then twice daily on 2/18, then every 12 hours as needed  Compazine 10 mg tonight, then before meals and at bedtime on 2/18, then every 6 hours as needed   If you develop nausea and vomiting that is not controlled by your nausea medication, call the clinic.   BELOW ARE SYMPTOMS THAT SHOULD BE REPORTED IMMEDIATELY:  *FEVER GREATER THAN 100.5 F  *CHILLS WITH OR WITHOUT FEVER  NAUSEA AND VOMITING THAT IS NOT CONTROLLED WITH YOUR NAUSEA MEDICATION  *UNUSUAL SHORTNESS OF BREATH  *UNUSUAL BRUISING OR BLEEDING  TENDERNESS IN MOUTH AND THROAT WITH OR WITHOUT PRESENCE OF ULCERS  *URINARY PROBLEMS  *BOWEL PROBLEMS  UNUSUAL RASH Items with * indicate a potential emergency and should be followed up as soon as possible.  Feel free to call the clinic should you have any questions or concerns. The clinic phone number is [phone].  It has been a pleasure to serve you today !

## 2013-10-18 ENCOUNTER — Ambulatory Visit (HOSPITAL_BASED_OUTPATIENT_CLINIC_OR_DEPARTMENT_OTHER): Payer: Medicare Other

## 2013-10-18 ENCOUNTER — Telehealth: Payer: Self-pay | Admitting: Physician Assistant

## 2013-10-18 ENCOUNTER — Encounter: Payer: Self-pay | Admitting: Oncology

## 2013-10-18 ENCOUNTER — Ambulatory Visit (HOSPITAL_BASED_OUTPATIENT_CLINIC_OR_DEPARTMENT_OTHER): Payer: Medicare Other | Admitting: Physician Assistant

## 2013-10-18 ENCOUNTER — Telehealth: Payer: Self-pay | Admitting: *Deleted

## 2013-10-18 ENCOUNTER — Other Ambulatory Visit (HOSPITAL_BASED_OUTPATIENT_CLINIC_OR_DEPARTMENT_OTHER): Payer: Medicare Other

## 2013-10-18 ENCOUNTER — Encounter: Payer: Self-pay | Admitting: Physician Assistant

## 2013-10-18 VITALS — BP 145/72 | HR 94 | Temp 98.1°F | Resp 18 | Ht 64.0 in | Wt 180.6 lb

## 2013-10-18 DIAGNOSIS — E876 Hypokalemia: Secondary | ICD-10-CM

## 2013-10-18 DIAGNOSIS — Z5111 Encounter for antineoplastic chemotherapy: Secondary | ICD-10-CM

## 2013-10-18 DIAGNOSIS — C50519 Malignant neoplasm of lower-outer quadrant of unspecified female breast: Secondary | ICD-10-CM

## 2013-10-18 DIAGNOSIS — D649 Anemia, unspecified: Secondary | ICD-10-CM

## 2013-10-18 DIAGNOSIS — Z170 Estrogen receptor positive status [ER+]: Secondary | ICD-10-CM

## 2013-10-18 DIAGNOSIS — C50911 Malignant neoplasm of unspecified site of right female breast: Secondary | ICD-10-CM

## 2013-10-18 DIAGNOSIS — R197 Diarrhea, unspecified: Secondary | ICD-10-CM

## 2013-10-18 DIAGNOSIS — C50919 Malignant neoplasm of unspecified site of unspecified female breast: Secondary | ICD-10-CM

## 2013-10-18 DIAGNOSIS — C50511 Malignant neoplasm of lower-outer quadrant of right female breast: Secondary | ICD-10-CM

## 2013-10-18 DIAGNOSIS — Z853 Personal history of malignant neoplasm of breast: Secondary | ICD-10-CM

## 2013-10-18 DIAGNOSIS — C9110 Chronic lymphocytic leukemia of B-cell type not having achieved remission: Secondary | ICD-10-CM

## 2013-10-18 LAB — COMPREHENSIVE METABOLIC PANEL (CC13)
ALT: 25 U/L (ref 0–55)
AST: 19 U/L (ref 5–34)
Albumin: 3.9 g/dL (ref 3.5–5.0)
Alkaline Phosphatase: 75 U/L (ref 40–150)
Anion Gap: 11 mEq/L (ref 3–11)
BUN: 11 mg/dL (ref 7.0–26.0)
CALCIUM: 9.1 mg/dL (ref 8.4–10.4)
CO2: 25 mEq/L (ref 22–29)
CREATININE: 0.8 mg/dL (ref 0.6–1.1)
Chloride: 111 mEq/L — ABNORMAL HIGH (ref 98–109)
Glucose: 126 mg/dl (ref 70–140)
Potassium: 3.3 mEq/L — ABNORMAL LOW (ref 3.5–5.1)
Sodium: 146 mEq/L — ABNORMAL HIGH (ref 136–145)
Total Bilirubin: <0.2 mg/dL (ref 0.20–1.20)
Total Protein: 6.3 g/dL — ABNORMAL LOW (ref 6.4–8.3)

## 2013-10-18 LAB — CBC WITH DIFFERENTIAL/PLATELET
BASO%: 0.8 % (ref 0.0–2.0)
BASOS ABS: 0.1 10*3/uL (ref 0.0–0.1)
EOS%: 1.8 % (ref 0.0–7.0)
Eosinophils Absolute: 0.2 10*3/uL (ref 0.0–0.5)
HEMATOCRIT: 32.7 % — AB (ref 34.8–46.6)
HGB: 10.5 g/dL — ABNORMAL LOW (ref 11.6–15.9)
LYMPH%: 53.3 % — ABNORMAL HIGH (ref 14.0–49.7)
MCH: 26.4 pg (ref 25.1–34.0)
MCHC: 32.2 g/dL (ref 31.5–36.0)
MCV: 81.9 fL (ref 79.5–101.0)
MONO#: 0.4 10*3/uL (ref 0.1–0.9)
MONO%: 4.2 % (ref 0.0–14.0)
NEUT#: 3.8 10*3/uL (ref 1.5–6.5)
NEUT%: 39.9 % (ref 38.4–76.8)
Platelets: 235 10*3/uL (ref 145–400)
RBC: 3.99 10*6/uL (ref 3.70–5.45)
RDW: 17.1 % — ABNORMAL HIGH (ref 11.2–14.5)
WBC: 9.6 10*3/uL (ref 3.9–10.3)
lymph#: 5.1 10*3/uL — ABNORMAL HIGH (ref 0.9–3.3)

## 2013-10-18 LAB — TECHNOLOGIST REVIEW

## 2013-10-18 MED ORDER — FAMOTIDINE IN NACL 20-0.9 MG/50ML-% IV SOLN
INTRAVENOUS | Status: AC
  Filled 2013-10-18: qty 50

## 2013-10-18 MED ORDER — SODIUM CHLORIDE 0.9 % IV SOLN
80.0000 mg/m2 | Freq: Once | INTRAVENOUS | Status: AC
  Administered 2013-10-18: 156 mg via INTRAVENOUS
  Filled 2013-10-18: qty 26

## 2013-10-18 MED ORDER — SODIUM CHLORIDE 0.9 % IV SOLN
Freq: Once | INTRAVENOUS | Status: AC
  Administered 2013-10-18: 13:00:00 via INTRAVENOUS

## 2013-10-18 MED ORDER — DIPHENHYDRAMINE HCL 50 MG/ML IJ SOLN
25.0000 mg | Freq: Once | INTRAMUSCULAR | Status: AC
  Administered 2013-10-18: 25 mg via INTRAVENOUS

## 2013-10-18 MED ORDER — ONDANSETRON 8 MG/50ML IVPB (CHCC)
8.0000 mg | Freq: Once | INTRAVENOUS | Status: AC
  Administered 2013-10-18: 8 mg via INTRAVENOUS

## 2013-10-18 MED ORDER — ONDANSETRON 8 MG/NS 50 ML IVPB
INTRAVENOUS | Status: AC
  Filled 2013-10-18: qty 8

## 2013-10-18 MED ORDER — FAMOTIDINE IN NACL 20-0.9 MG/50ML-% IV SOLN
20.0000 mg | Freq: Once | INTRAVENOUS | Status: AC
  Administered 2013-10-18: 20 mg via INTRAVENOUS

## 2013-10-18 MED ORDER — DEXAMETHASONE SODIUM PHOSPHATE 20 MG/5ML IJ SOLN
20.0000 mg | Freq: Once | INTRAMUSCULAR | Status: AC
  Administered 2013-10-18: 20 mg via INTRAVENOUS

## 2013-10-18 MED ORDER — DIPHENHYDRAMINE HCL 50 MG/ML IJ SOLN
INTRAMUSCULAR | Status: AC
  Filled 2013-10-18: qty 1

## 2013-10-18 MED ORDER — SODIUM CHLORIDE 0.9 % IJ SOLN
10.0000 mL | INTRAMUSCULAR | Status: DC | PRN
  Administered 2013-10-18: 10 mL
  Filled 2013-10-18: qty 10

## 2013-10-18 MED ORDER — SODIUM CHLORIDE 0.9 % IV SOLN
INTRAVENOUS | Status: DC
  Administered 2013-10-18: 13:00:00 via INTRAVENOUS

## 2013-10-18 MED ORDER — HEPARIN SOD (PORK) LOCK FLUSH 100 UNIT/ML IV SOLN
500.0000 [IU] | Freq: Once | INTRAVENOUS | Status: AC | PRN
  Administered 2013-10-18: 500 [IU]
  Filled 2013-10-18: qty 5

## 2013-10-18 MED ORDER — DEXAMETHASONE SODIUM PHOSPHATE 20 MG/5ML IJ SOLN
INTRAMUSCULAR | Status: AC
  Filled 2013-10-18: qty 5

## 2013-10-18 NOTE — Patient Instructions (Signed)
[facility] Discharge Instructions for Patients Receiving Chemotherapy  Today you received the following chemotherapy agents :  Taxol.  To help prevent nausea and vomiting after your treatment, we encourage you to take your nausea medication as instructed by your physician.   If you develop nausea and vomiting that is not controlled by your nausea medication, call the clinic.   BELOW ARE SYMPTOMS THAT SHOULD BE REPORTED IMMEDIATELY:  *FEVER GREATER THAN 100.5 F  *CHILLS WITH OR WITHOUT FEVER  NAUSEA AND VOMITING THAT IS NOT CONTROLLED WITH YOUR NAUSEA MEDICATION  *UNUSUAL SHORTNESS OF BREATH  *UNUSUAL BRUISING OR BLEEDING  TENDERNESS IN MOUTH AND THROAT WITH OR WITHOUT PRESENCE OF ULCERS  *URINARY PROBLEMS  *BOWEL PROBLEMS  UNUSUAL RASH Items with * indicate a potential emergency and should be followed up as soon as possible.  Feel free to call the clinic you have any questions or concerns. The clinic phone number is [phone].    

## 2013-10-18 NOTE — Progress Notes (Signed)
ID: [name] [name] OB: [birth year]  MR#: [id number]  RJJ#:[id number]  PCP: [name], MD GYN:  M. Edwinna Areola SU: Donnie Mesa, Lowella Bandy,  OTHER MD: Christene Slates, Dian Situ, Brien Few  CHIEF COMPLAINT:  Adjuvant chemotherapy for Right Breast Cancer   HISTORY OF PRESENT ILLNESS: As per previously documented note:  [name] [name] has a history of breast cancer dating back to 1997, [name] performed a right lumpectomy and axillary lymph node dissection for what according to the patient and her family was a stage I invasive ductal breast cancer. She received radiation adjuvantly and then took tamoxifen for 5 years  In February of 2014 she had a normal screening mammography, but in November 2014 she felt a "hard lump" in her right breast. She brought this to the attention of her urologist, Dr. Izora Gala, and he set her up for bilateral diagnostic mammography with mammography at [facility] 07/13/2013. This showed her breast density to be category B. There was no mammographic abnormality noted but ultrasonography showed a 1.4 cm irregularly shaped solid mass in the right breast at the 8:00 position. This was biopsied the same day, and showed ([id number]) an invasive ductal carcinoma, grade 2, estrogen receptor 100% positive, progesterone receptor 13% positive, with an MIB-1 of 33% and HER-2 amplification with a HER-2: CEP 17 ratio of 3.19, and a HER-2 copy number percent all of 4.15.  The patient's subsequent history is as detailed below  INTERVAL HISTORY: [name] [name] returns today accompanied by her husband Arnette Norris. She is due for day 8 cycle 2 of paclitaxel given along with trastuzumab/pertuzumab.  She is receiving the paclitaxel weekly (days 1, 8, and 15) with the trastuzumab/pertuzumab given on day 1 of each 21 day cycle.   [name] [name] is having some continued diarrhea, likely associated with the pertuzumab which she last received one week ago. She is using Questran and  Imodium. This does seem to have improved the situation, and she's had no loose bowel movements this morning thus far. She denies any blood or mucus in the stool. She does have some rectal pain occasionally with bowel movements, and some mild abdominal cramping.  Although she continues to have some heartburn, she fortunately denies any nausea or emesis.   [name] [name] continues to have some tenderness in the tips of her fingers with associated skin changes. She's also had a rash with small skin lesions bilaterally on the forearms, all in sun exposed areas. These lesions are dry and erythematous. She denies any itching or pain, and has had no additional rashes elsewhere.   REVIEW OF SYSTEMS: Otherwise, [name] denies any fevers or chills. She continues to complain of fatigue. She's had no signs of abnormal bleeding other than mild epistasis when she blows her nose. She does have some sinus congestion and a runny nose. She denies any mouth ulcers or oral sensitivity. She has some chronic neck and back pain which is stable. She continues to have some joint pain associated with arthritis which is stable. She denies any problems with numbness or tingling in the extremities. She's had no peripheral swelling. She denies any headaches but does feel forgetful at times. She continues to have problems with anxiety and depression but denies suicidal ideation.  Detailed review of systems is otherwise stable and noncontributory.    PAST MEDICAL HISTORY: Past Medical History  Diagnosis Date  . Sinus tachycardia     mild resting  . HLD (hyperlipidemia)   . Orthostatic hypotension  slight in the past  . Anxiety   . Depression   . Diverticulosis   . Fibromyalgia 1978  . Hypothyroidism   . Alopecia   . Ruptured disk     one in neck and two in back  . Plantar fasciitis   . GERD (gastroesophageal reflux disease)   . History of syncope     episode 2008--  no recurrence since  . Ureteral calculi     bilateral  .  Leukocytosis   . Chronic back pain     "lower back and upper neck" (07/28/2013)  . 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 ear   . Urgency of urination   . Hematuria   . Sensation of pressure in bladder area   . Wears glasses   . Ecchymosis     FROM IV AND LAB WORK THE PAST WEEK  06-17-2013  . Stool incontinence     "at times recently"   . Kidney stones 2014  . CAP (community acquired pneumonia)     admission 06-04-2013 secondary to failed oral antibitotics---  last cxr 06-16-2013  much improved  . SOB (shortness of breath) 06-17-2013 CURRENTLY RESIDUAL FROM RECENT CAP    SOB from obesity and deconditioning 2009- eval included CPX  . Arthritis     "spine" (07/28/2013)  . DDD (degenerative disc disease)   . CLL (chronic lymphocytic leukemia) dx'd 05/2013    "I don't think I have it though" (07/28/2013)  . Breast cancer 1997; 2014    right    PAST SURGICAL HISTORY: Past Surgical History  Procedure Laterality Date  . Lumbar epidural injection      has had 7 injections  . Total abdominal hysterectomy w/ bilateral salpingoophorectomy  1997  . Breast lumpectomy with axillary lymph node dissection Right 1997  . Transthoracic echocardiogram  05-11-2007  dr Ron Parker    normal lvf/  ef 65%  . Tonsillectomy  AGE 59  . Retinal detachment surgery Right 2013  . Cystoscopy with retrograde pyelogram, ureteroscopy and stent placement Bilateral 06/17/2013    Procedure: CYSTOSCOPY WITH RETROGRADE PYELOGRAM, URETEROSCOPY AND LEFT DOUBLE  J STENT PLACEMENT RIGHT URETERAL HOLMIIUM LASER AND DOUBLE J STENT ;  Surgeon: Hanley Ben, MD;  Location: [place];  Service: Urology;  Laterality: Bilateral;  . Holmium laser application Bilateral 90/24/0973    Procedure: HOLMIUM LASER APPLICATION;  Surgeon: Hanley Ben, MD;  Location: [place];  Service: Urology;  Laterality: Bilateral;  . Mastectomy w/ nodes partial Right 1997  . Mastectomy complete / simple Right 07/28/2013  . Appendectomy   1997  . Breast biopsy Right 2014  . Cystoscopy with ureteroscopy, stone basketry and stent placement Bilateral 2014  . Lithotripsy Left ~ 06/2013  . Simple mastectomy with axillary sentinel node biopsy Right 07/28/2013    Procedure: RIGHT TOTAL  MASTECTOMY;  Surgeon: Imogene Burn. Georgette Dover, MD;  Location: [place];  Service: General;  Laterality: Right;  . Portacath placement Left 07/28/2013    Procedure: ATTEMPTED INSERTION PORT-A-CATH;  Surgeon: Imogene Burn. Georgette Dover, MD;  Location: [place] [facility];  Service: General;  Laterality: Left;    FAMILY HISTORY Family History  Problem Relation Age of Onset  . Heart disease Maternal Grandfather   . Diabetes Maternal Grandfather   . Colon cancer Maternal Aunt   . Other Paternal Grandmother     brain tumor  . Dementia Mother   . Diabetes Mother   . Osteoporosis Mother   . Diabetes Maternal Aunt   . Prostate cancer Father   .  Brain cancer Maternal Grandmother   . Bipolar disorder Maternal Aunt   . Stroke Maternal Aunt    the patient's mother died at the age of 81. The patient's father is alive at age 6. She had no brothers or sisters. Her father has a history of prostate cancer. There is no history of breast or ovarian cancer in the family. One maternal first cousin has a history of Hodgkin's lymphoma.  GYNECOLOGIC HISTORY:  Menarche age 30, first live birth age 1. She is GX P1. She underwent total abdominal hysterectomy and bilateral salpingo-oophorectomy in 1997 she did not take hormone replacement.  SOCIAL HISTORY: (Updated February 2015) She is a Agricultural engineer. Her husband Arnette Norris farms approximately 500 acres. Daughter Colletta [place] lives in [place] where she works as a Pension scheme manager for a drug company. The patient has no grandchildren. She has two "grand cats". She attends a local [employer] church of [place]: Not in place   HEALTH MAINTENANCE:  (Updated  February  2015) History  Substance Use Topics  . Smoking  status: Never Smoker   . Smokeless tobacco: Never Used  . 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 Amoxicillin Itching  . Lasix [Furosemide] Nausea And Vomiting and Other (See Comments)    headache  . Lithium Other (See Comments)    Dizziness, "cause me to fall"  . Sulfa Antibiotics Hives  . Trazodone And Nefazodone Other (See Comments)    insomnia  . Lyrica [Pregabalin] Diarrhea, Nausea Only and Rash    Current Outpatient Prescriptions  Medication Sig Dispense Refill  . ALPRAZolam (XANAX) 0.5 MG tablet TAKE 1 TABLET BY MOUTH EVERY 8 HOURS AS NEEDED  30 tablet  0  . amitriptyline (ELAVIL) 150 MG tablet Take 150 mg by mouth at bedtime.      . Ascorbic Acid (VITAMIN C) 1000 MG tablet Take 1,000 mg by mouth daily.      Marland Kitchen buPROPion (WELLBUTRIN SR) 150 MG 12 hr tablet Take 150-300 mg by mouth 2 (two) times daily. Take 340m in the morning and 1536mat 2pm      . cetirizine (ZYRTEC) 10 MG tablet Take 10 mg by mouth daily.      . Cholecalciferol (VITAMIN D-3) 1000 UNITS CAPS Take 5,000 Units by mouth daily.      . cholestyramine (QUESTRAN) 4 G packet Take 1 packet (4 g total) by mouth 2 (two) times daily as needed (diarrhea). Take with meals.  40 each  1  . levothyroxine (SYNTHROID, LEVOTHROID) 50 MCG tablet Take 50 mcg by mouth daily before breakfast.      . lidocaine-prilocaine (EMLA) cream Apply 1 application topically as needed.  30 g  3  . metoprolol succinate (TOPROL-XL) 25 MG 24 hr tablet Take 25 mg by mouth daily.      . minoxidil (ROGAINE) 2 % external solution Apply 1 application topically 2 (two) times daily as needed.       . Multiple Vitamins-Minerals (HAIR VITAMINS PO) Take 1 tablet by mouth 2 (two) times daily.      . Omega-3 Fatty Acids (FISH OIL PO)  Take 1 capsule by mouth daily.       . Marland Kitchenmeprazole (PRILOSEC) 40 MG capsule Take  1 capsule (40 mg total) by mouth every morning.  30 capsule  3  . Oxcarbazepine (TRILEPTAL) 300 MG tablet Take 300 mg by mouth 2 (two) times daily.      Marland Kitchen oxyCODONE-acetaminophen (PERCOCET/ROXICET) 5-325 MG per tablet Take 1 tablet by mouth every 4 (four) hours as needed for moderate pain.  40 tablet  0  . potassium chloride (MICRO-K) 10 MEQ CR capsule 2 tabs by mouth in the morning and 1 tab by mouth in the evening  90 capsule  3  . Vilazodone HCl (VIIBRYD) 20 MG TABS Take 1 tablet by mouth daily.      . vitamin B-12 (CYANOCOBALAMIN) 1000 MCG tablet Take 1,000 mcg by mouth daily.      Marland Kitchen ibuprofen (ADVIL,MOTRIN) 200 MG tablet Take 800 mg by mouth every 6 (six) hours as needed for moderate pain.      Marland Kitchen ondansetron (ZOFRAN) 8 MG tablet 1 tab by mouth at dinner on day of chemo, then 1 tab with breakfast and dinner on day after chemo.  Then 1 tab every 12 hrs PRN nausea  30 tablet  1  . prochlorperazine (COMPAZINE) 10 MG tablet Take 1 tablet (10 mg total) by mouth 4 (four) times daily -  before meals and at bedtime. Take before meals and at bedtime starting before supper  the evening of chemo and copntinue through the next day. Then take AS NEEDED before meals and at bedtime  30 tablet  1   Current Facility-Administered Medications  Medication Dose Route Frequency Provider Last Rate Last Dose  . 0.9 %  sodium chloride infusion   Intravenous Continuous Shaneta Cervenka Milda Smart, PA-C       Facility-Administered Medications Ordered in Other Visits  Medication Dose Route Frequency Provider Last Rate Last Dose  . Dexamethasone Sodium Phosphate (DECADRON) injection 20 mg  20 mg Intravenous Once Damyn Weitzel G Nozomi Mettler, PA-C      . diphenhydrAMINE (BENADRYL) injection 25 mg  25 mg Intravenous Once Silver Achey G Joaovictor Krone, PA-C      . famotidine (PEPCID) IVPB 20 mg  20 mg Intravenous Once Candice Tobey G Zsazsa Bahena, PA-C      . heparin lock flush 100 unit/mL  500 Units  Intracatheter Once PRN Danila Eddie G Oluwanifemi Susman, PA-C      . ondansetron (ZOFRAN) IVPB 8 mg  8 mg Intravenous Once Emillee Talsma G Michille Mcelrath, PA-C      . PACLitaxel (TAXOL) 156 mg in sodium chloride 0.9 % 250 mL chemo infusion (</= 23m/m2)  80 mg/m2 (Treatment Plan Actual) Intravenous Once Townes Fuhs G Lailynn Southgate, PA-C      . sodium chloride 0.9 % injection 10 mL  10 mL Intracatheter PRN Elorah Dewing GMilda Smart PA-C        OBJECTIVE: 68year old white woman who appears tired but is in no acute distress Filed Vitals:   10/18/13 1021  BP: 145/72  Pulse: 94  Temp: 98.1 F (36.7 C)  Resp: 18     Body mass index is 30.98 kg/(m^2).    ECOG FS:1 - Symptomatic but completely ambulatory Filed Weights   10/18/13 1021  Weight: 180 lb 9.6 oz (81.92 kg)   Physical Exam: HEENT:  Sclerae anicteric.  Oropharynx clear and moist. No visible ulcerations. No candidiasis. No pharyngeal erythema. Neck is supple, trachea midline.  NODES:  No cervical or supraclavicular lymphadenopathy palpated.  BREAST EXAM:  Deferred. Axillae are benign bilaterally, with no palpable lymphadenopathy. LUNGS:  Clear to auscultation bilaterally.  No wheezes or rhonchi HEART:  Regular rate and  rhythm.  ABDOMEN:  Soft, nontender. No organomegaly or masses palpated.  Positive bowel sounds.  MSK:  No focal spinal tenderness to palpation. Good range of motion bilaterally in the upper extremities. EXTREMITIES:  No peripheral edema.   SKIN:  There is some mild cracking of the skin on the tips of the patient's palms and fourth fingers. No nail dyscrasia noted. There are some erythematous, dry lesions on the forearms. No additional rashes noted elsewhere. NEURO:  Nonfocal. Well oriented.  Anxious affect.       LAB RESULTS:   Lab Results  Component Value Date   WBC 9.6 10/18/2013   NEUTROABS 3.8 10/18/2013   HGB 10.5* 10/18/2013   HCT 32.7* 10/18/2013   MCV 81.9 10/18/2013   PLT 235 10/18/2013      Chemistry      Component Value Date/Time   NA 146* 10/18/2013 1003   NA  145 07/30/2013 0422   K 3.3* 10/18/2013 1003   K 3.9 07/30/2013 0422   CL 111 07/30/2013 0422   CO2 25 10/18/2013 1003   CO2 25 07/30/2013 0422   BUN 11.0 10/18/2013 1003   BUN 6 07/30/2013 0422   CREATININE 0.8 10/18/2013 1003   CREATININE 0.76 07/30/2013 0422      Component Value Date/Time   CALCIUM 9.1 10/18/2013 1003   CALCIUM 8.3* 07/30/2013 0422   ALKPHOS 75 10/18/2013 1003   ALKPHOS 196* 06/04/2013 2221   AST 19 10/18/2013 1003   AST 30 06/04/2013 2221   ALT 25 10/18/2013 1003   ALT 45* 06/04/2013 2221   BILITOT <0.20 10/18/2013 1003   BILITOT 0.3 06/04/2013 2221      STUDIES:  An echocardiogram on 08/23/2013 showed an ejection fraction of 55-60%.  Due to be repeated in late April, 3 months after initiating therapy.   ASSESSMENT: 68 y.o. [place] woman  (1) status post right lumpectomy and axillary lymph node dissection in 1997 for a stage I breast cancer, treated with adjuvant radiation and tamoxifen for 5 years  (2) status post right breast biopsy 07/13/2013 for a clinical T1c N0, stage IA invasive ductal carcinoma, grade 2, estrogen receptor 100% positive, progesterone receptor 13% positive, with an MIB-1 of 33%, and HER-2 amplification by CISH with a HER2/CEP 17 ratio of 3.19, and an average HER-2 copy number per cell of 4.15  (3) status post right mastectomy 07/28/2013 for a pT1c pN0, stage IA invasive ductal carcinoma, grade 3, with close but negative margins. Prognostic panel was not repeated  (4) to receive weekly paclitaxel x12 with trastuzumab/ pertuzumab every 3 weeks x one year starting 09/20/2048   OTHER PROBLEMS:  (a) History of chronic lymphoid leukemia diagnosed by flow cytometry 06/29/2013, the cells being CD5, CD20 and CD23 positive, CD10 negative. Her absolute lymphocyte count slightly increased from 5000 to  5700. We'll closely monitor lymphocyte count if needed, we will order the repeat peripheral blood flow cytometry probably in 3 months.  (b) Anemia  with a normal MCV and normal ferritin  (c) genetics testing pending  (d) the patient may consider eventual reconstruction   PLAN: Kawanda will proceed with  infusion today, day 8 cycle 2, paclitaxel alone. I will see her again next week prior to day 15 of paclitaxel. We will continue to follow her closely for both the skin changes/cracking on the tips of the fingers and the continued diarrhea. She'll continue using Vaseline and triple antibiotic ointment on the fingers as before. With regards to the diarrhea, she'll continue  to use Questran and Imodium as needed. We will give her an extra liter of normal saline today for extra hydration. I have encouraged her to keep herself well hydrated at home. She will also go back on her oral potassium supplement, 10 mEq daily for mild hypokalemia.  I will reassess next week and decide whether we need to hold one dose of paclitaxel. The following week on March 10, she is due for her her next dose of paclitaxel along with trastuzumab/pertuzumab.  If the diarrhea continues to be significant, we may need to hold the pertuzumab with cycle 3.  All the above was reviewed with Marcie Bal and her husband, both of whom voice understanding and agreement with this plan. They will call with any changes or problems. Otherwise I will see her again next week on March 3 as planned.   Durene Fruits   Medical oncology  10/18/2013 1:13 PM

## 2013-10-18 NOTE — Telephone Encounter (Signed)
Per staff message I have adjusted 3/17 appt 

## 2013-10-18 NOTE — Telephone Encounter (Signed)
, °

## 2013-10-25 ENCOUNTER — Ambulatory Visit (HOSPITAL_BASED_OUTPATIENT_CLINIC_OR_DEPARTMENT_OTHER): Payer: Medicare Other

## 2013-10-25 ENCOUNTER — Encounter: Payer: Self-pay | Admitting: Physician Assistant

## 2013-10-25 ENCOUNTER — Other Ambulatory Visit (HOSPITAL_BASED_OUTPATIENT_CLINIC_OR_DEPARTMENT_OTHER): Payer: Medicare Other

## 2013-10-25 ENCOUNTER — Ambulatory Visit (HOSPITAL_BASED_OUTPATIENT_CLINIC_OR_DEPARTMENT_OTHER): Payer: Medicare Other | Admitting: Physician Assistant

## 2013-10-25 VITALS — BP 107/58 | HR 84 | Temp 98.5°F | Resp 18 | Ht 64.0 in | Wt 179.3 lb

## 2013-10-25 DIAGNOSIS — C50919 Malignant neoplasm of unspecified site of unspecified female breast: Secondary | ICD-10-CM

## 2013-10-25 DIAGNOSIS — R0989 Other specified symptoms and signs involving the circulatory and respiratory systems: Secondary | ICD-10-CM

## 2013-10-25 DIAGNOSIS — C50511 Malignant neoplasm of lower-outer quadrant of right female breast: Secondary | ICD-10-CM

## 2013-10-25 DIAGNOSIS — C50519 Malignant neoplasm of lower-outer quadrant of unspecified female breast: Secondary | ICD-10-CM

## 2013-10-25 DIAGNOSIS — R0609 Other forms of dyspnea: Secondary | ICD-10-CM

## 2013-10-25 DIAGNOSIS — R5381 Other malaise: Secondary | ICD-10-CM

## 2013-10-25 DIAGNOSIS — D649 Anemia, unspecified: Secondary | ICD-10-CM

## 2013-10-25 DIAGNOSIS — R197 Diarrhea, unspecified: Secondary | ICD-10-CM

## 2013-10-25 DIAGNOSIS — F341 Dysthymic disorder: Secondary | ICD-10-CM

## 2013-10-25 DIAGNOSIS — Z170 Estrogen receptor positive status [ER+]: Secondary | ICD-10-CM

## 2013-10-25 DIAGNOSIS — R5383 Other fatigue: Secondary | ICD-10-CM

## 2013-10-25 DIAGNOSIS — E876 Hypokalemia: Secondary | ICD-10-CM

## 2013-10-25 DIAGNOSIS — M797 Fibromyalgia: Secondary | ICD-10-CM

## 2013-10-25 DIAGNOSIS — C9110 Chronic lymphocytic leukemia of B-cell type not having achieved remission: Secondary | ICD-10-CM

## 2013-10-25 DIAGNOSIS — C50911 Malignant neoplasm of unspecified site of right female breast: Secondary | ICD-10-CM

## 2013-10-25 DIAGNOSIS — Z5111 Encounter for antineoplastic chemotherapy: Secondary | ICD-10-CM

## 2013-10-25 LAB — COMPREHENSIVE METABOLIC PANEL (CC13)
ALT: 25 U/L (ref 0–55)
AST: 15 U/L (ref 5–34)
Albumin: 3.9 g/dL (ref 3.5–5.0)
Alkaline Phosphatase: 78 U/L (ref 40–150)
Anion Gap: 10 mEq/L (ref 3–11)
BUN: 10.2 mg/dL (ref 7.0–26.0)
CHLORIDE: 111 meq/L — AB (ref 98–109)
CO2: 25 mEq/L (ref 22–29)
Calcium: 9.5 mg/dL (ref 8.4–10.4)
Creatinine: 0.9 mg/dL (ref 0.6–1.1)
Glucose: 109 mg/dl (ref 70–140)
Potassium: 3.6 mEq/L (ref 3.5–5.1)
Sodium: 146 mEq/L — ABNORMAL HIGH (ref 136–145)
Total Bilirubin: 0.23 mg/dL (ref 0.20–1.20)
Total Protein: 6.4 g/dL (ref 6.4–8.3)

## 2013-10-25 LAB — CBC WITH DIFFERENTIAL/PLATELET
BASO%: 0.8 % (ref 0.0–2.0)
Basophils Absolute: 0.1 10*3/uL (ref 0.0–0.1)
EOS%: 2 % (ref 0.0–7.0)
Eosinophils Absolute: 0.3 10*3/uL (ref 0.0–0.5)
HEMATOCRIT: 32.1 % — AB (ref 34.8–46.6)
HGB: 10.2 g/dL — ABNORMAL LOW (ref 11.6–15.9)
LYMPH#: 7.5 10*3/uL — AB (ref 0.9–3.3)
LYMPH%: 57.3 % — ABNORMAL HIGH (ref 14.0–49.7)
MCH: 26.3 pg (ref 25.1–34.0)
MCHC: 31.7 g/dL (ref 31.5–36.0)
MCV: 83 fL (ref 79.5–101.0)
MONO#: 0.4 10*3/uL (ref 0.1–0.9)
MONO%: 3.2 % (ref 0.0–14.0)
NEUT#: 4.8 10*3/uL (ref 1.5–6.5)
NEUT%: 36.7 % — AB (ref 38.4–76.8)
Platelets: 252 10*3/uL (ref 145–400)
RBC: 3.86 10*6/uL (ref 3.70–5.45)
RDW: 18.2 % — ABNORMAL HIGH (ref 11.2–14.5)
WBC: 13.1 10*3/uL — AB (ref 3.9–10.3)

## 2013-10-25 LAB — TECHNOLOGIST REVIEW

## 2013-10-25 LAB — MAGNESIUM (CC13): MAGNESIUM: 1.8 mg/dL (ref 1.5–2.5)

## 2013-10-25 MED ORDER — DEXAMETHASONE SODIUM PHOSPHATE 20 MG/5ML IJ SOLN
INTRAMUSCULAR | Status: AC
  Filled 2013-10-25: qty 5

## 2013-10-25 MED ORDER — ONDANSETRON 8 MG/50ML IVPB (CHCC)
8.0000 mg | Freq: Once | INTRAVENOUS | Status: AC
  Administered 2013-10-25: 8 mg via INTRAVENOUS

## 2013-10-25 MED ORDER — FAMOTIDINE IN NACL 20-0.9 MG/50ML-% IV SOLN
INTRAVENOUS | Status: AC
  Filled 2013-10-25: qty 50

## 2013-10-25 MED ORDER — SODIUM CHLORIDE 0.9 % IJ SOLN
10.0000 mL | INTRAMUSCULAR | Status: DC | PRN
  Administered 2013-10-25: 10 mL
  Filled 2013-10-25: qty 10

## 2013-10-25 MED ORDER — DIPHENHYDRAMINE HCL 50 MG/ML IJ SOLN
25.0000 mg | Freq: Once | INTRAMUSCULAR | Status: AC
  Administered 2013-10-25: 25 mg via INTRAVENOUS

## 2013-10-25 MED ORDER — HEPARIN SOD (PORK) LOCK FLUSH 100 UNIT/ML IV SOLN
500.0000 [IU] | Freq: Once | INTRAVENOUS | Status: AC | PRN
  Administered 2013-10-25: 500 [IU]
  Filled 2013-10-25: qty 5

## 2013-10-25 MED ORDER — FAMOTIDINE IN NACL 20-0.9 MG/50ML-% IV SOLN
20.0000 mg | Freq: Once | INTRAVENOUS | Status: AC
  Administered 2013-10-25: 20 mg via INTRAVENOUS

## 2013-10-25 MED ORDER — DIPHENHYDRAMINE HCL 50 MG/ML IJ SOLN
INTRAMUSCULAR | Status: AC
Start: 2013-10-25 — End: 2013-10-25
  Filled 2013-10-25: qty 1

## 2013-10-25 MED ORDER — ONDANSETRON 8 MG/NS 50 ML IVPB
INTRAVENOUS | Status: AC
  Filled 2013-10-25: qty 8

## 2013-10-25 MED ORDER — SODIUM CHLORIDE 0.9 % IV SOLN
80.0000 mg/m2 | Freq: Once | INTRAVENOUS | Status: AC
  Administered 2013-10-25: 156 mg via INTRAVENOUS
  Filled 2013-10-25: qty 26

## 2013-10-25 MED ORDER — DEXAMETHASONE SODIUM PHOSPHATE 20 MG/5ML IJ SOLN
20.0000 mg | Freq: Once | INTRAMUSCULAR | Status: AC
  Administered 2013-10-25: 20 mg via INTRAVENOUS

## 2013-10-25 MED ORDER — SODIUM CHLORIDE 0.9 % IV SOLN
Freq: Once | INTRAVENOUS | Status: AC
  Administered 2013-10-25: 12:00:00 via INTRAVENOUS

## 2013-10-25 NOTE — Patient Instructions (Signed)
[facility] Discharge Instructions for Patients Receiving Chemotherapy  Today you received the following chemotherapy agent: Taxol   To help prevent nausea and vomiting after your treatment, we encourage you to take your nausea medication as prescribed.    If you develop nausea and vomiting that is not controlled by your nausea medication, call the clinic.   BELOW ARE SYMPTOMS THAT SHOULD BE REPORTED IMMEDIATELY:  *FEVER GREATER THAN 100.5 F  *CHILLS WITH OR WITHOUT FEVER  NAUSEA AND VOMITING THAT IS NOT CONTROLLED WITH YOUR NAUSEA MEDICATION  *UNUSUAL SHORTNESS OF BREATH  *UNUSUAL BRUISING OR BLEEDING  TENDERNESS IN MOUTH AND THROAT WITH OR WITHOUT PRESENCE OF ULCERS  *URINARY PROBLEMS  *BOWEL PROBLEMS  UNUSUAL RASH Items with * indicate a potential emergency and should be followed up as soon as possible.  Feel free to call the clinic you have any questions or concerns. The clinic phone number is [phone].    

## 2013-10-25 NOTE — Progress Notes (Signed)
ID: [name] [name] OB: [birth year]  MR#: [id number]  FTD#:[id number]  PCP: [name], MD GYN:  M. Edwinna Areola SU: Donnie Mesa, Lowella Bandy,  OTHER MD: Christene Slates, Dian Situ, Brien Few  CHIEF COMPLAINT:  Adjuvant chemotherapy for Right Breast Cancer   BREAST CANCER HISTORY: As per previously documented note:  [name] [name] has a history of breast cancer dating back to 1997, [name] performed a right lumpectomy and axillary lymph node dissection for what according to the patient and her family was a stage I invasive ductal breast cancer. She received radiation adjuvantly and then took tamoxifen for 5 years  In February of 2014 she had a normal screening mammography, but in November 2014 she felt a "hard lump" in her right breast. She brought this to the attention of her urologist, Dr. Izora Gala, and he set her up for bilateral diagnostic mammography with mammography at [facility] 07/13/2013. This showed her breast density to be category B. There was no mammographic abnormality noted but ultrasonography showed a 1.4 cm irregularly shaped solid mass in the right breast at the 8:00 position. This was biopsied the same day, and showed ([id number]) an invasive ductal carcinoma, grade 2, estrogen receptor 100% positive, progesterone receptor 13% positive, with an MIB-1 of 33% and HER-2 amplification with a HER-2: CEP 17 ratio of 3.19, and a HER-2 copy number percent all of 4.15.  The patient's subsequent history is as detailed below  INTERVAL HISTORY: Shenae returns today accompanied by her sister-in-law, [name] [name]. She is due for day 15 cycle 2 of paclitaxel given along with trastuzumab/pertuzumab.  She is receiving the paclitaxel weekly (days 1, 8, and 15) with  trastuzumab/pertuzumab given on day 1 of each 21 day cycle. She is due for paclitaxel alone today.  [name] [name] is tolerating this regimen reasonably well, although she continues to have frequent bowel movements. She's  taking Questran twice daily and Imodium once daily. She is averaging 68 loose or formed stools daily Fortunately, they are no longer watery. She denies any blood or mucus in the stool. She's had no abdominal cramping, denies any nausea or emesis, and has had no fevers or chills.  REVIEW OF SYSTEMS: Otherwise, [name] [name] continues to complain of fatigue. She's had no signs of abnormal bruising or bleeding. She continues to have have some sinus congestion and a runny nose. She's had no increased cough, phlegm production, orthopnea, peripheral swelling, chest pain, or palpitations. She does have some shortness of breath with exertion which is stable. She denies any mouth ulcers or oral sensitivity. She has a history of fibromyalgia with associated joint pain and muscle aches. These are stable. She denies any signs of peripheral neuropathy in either the upper or lower extremities. She's had no abnormal headaches, but continues to feel somewhat forgetful at times. She denies any dizziness. She has some anxiety and depression, both of which she feels are stable. She denies any suicidal ideation.   Detailed review of systems is otherwise stable and noncontributory.    PAST MEDICAL HISTORY: Past Medical History  Diagnosis Date  . Sinus tachycardia     mild resting  . HLD (hyperlipidemia)   . Orthostatic hypotension     slight in the past  . Anxiety   . Depression   . Diverticulosis   . Fibromyalgia 1978  . Hypothyroidism   . Alopecia   . Ruptured disk     one in neck and two in back  . Plantar fasciitis   .  GERD (gastroesophageal reflux disease)   . History of syncope     episode 2008--  no recurrence since  . Ureteral calculi     bilateral  . Leukocytosis   . Chronic back pain     "lower back and upper neck" (07/28/2013)  . 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 ear   . Urgency of urination   . Hematuria   . Sensation of pressure in bladder area   . Wears glasses   . Ecchymosis     FROM IV AND LAB WORK THE  PAST WEEK  06-17-2013  . Stool incontinence     "at times recently"   . Kidney stones 2014  . CAP (community acquired pneumonia)     admission 06-04-2013 secondary to failed oral antibitotics---  last cxr 06-16-2013  much improved  . SOB (shortness of breath) 06-17-2013 CURRENTLY RESIDUAL FROM RECENT CAP    SOB from obesity and deconditioning 2009- eval included CPX  . Arthritis     "spine" (07/28/2013)  . DDD (degenerative disc disease)   . CLL (chronic lymphocytic leukemia) dx'd 05/2013    "I don't think I have it though" (07/28/2013)  . Breast cancer 1997; 2014    right    PAST SURGICAL HISTORY: Past Surgical History  Procedure Laterality Date  . Lumbar epidural injection      has had 7 injections  . Total abdominal hysterectomy w/ bilateral salpingoophorectomy  1997  . Breast lumpectomy with axillary lymph node dissection Right 1997  . Transthoracic echocardiogram  05-11-2007  dr Ron Parker    normal lvf/  ef 65%  . Tonsillectomy  AGE 29  . Retinal detachment surgery Right 2013  . Cystoscopy with retrograde pyelogram, ureteroscopy and stent placement Bilateral 06/17/2013    Procedure: CYSTOSCOPY WITH RETROGRADE PYELOGRAM, URETEROSCOPY AND LEFT DOUBLE  J STENT PLACEMENT RIGHT URETERAL HOLMIIUM LASER AND DOUBLE J STENT ;  Surgeon: Hanley Ben, MD;  Location: [facility];  Service: Urology;  Laterality: Bilateral;  . Holmium laser application Bilateral 81/85/6314    Procedure: HOLMIUM LASER APPLICATION;  Surgeon: Hanley Ben, MD;  Location: [facility];  Service: Urology;  Laterality: Bilateral;  . Mastectomy w/ nodes partial Right 1997  . Mastectomy complete / simple Right 07/28/2013  . Appendectomy  1997  . Breast biopsy Right 2014  . Cystoscopy with ureteroscopy, stone basketry and stent placement Bilateral 2014  . Lithotripsy Left ~ 06/2013  . Simple mastectomy with axillary sentinel node biopsy Right 07/28/2013    Procedure: RIGHT TOTAL   MASTECTOMY;  Surgeon: Imogene Burn. Georgette Dover, MD;  Location: [facility];  Service: General;  Laterality: Right;  . Portacath placement Left 07/28/2013    Procedure: ATTEMPTED INSERTION PORT-A-CATH;  Surgeon: Imogene Burn. Georgette Dover, MD;  Location: [place] [facility];  Service: General;  Laterality: Left;    FAMILY HISTORY Family History  Problem Relation Age of Onset  . Heart disease Maternal Grandfather   . Diabetes Maternal Grandfather   . Colon cancer Maternal Aunt   . Other Paternal Grandmother     brain tumor  . Dementia Mother   . Diabetes Mother   . Osteoporosis Mother   . Diabetes Maternal Aunt   . Prostate cancer Father   . Brain cancer Maternal Grandmother   . Bipolar disorder Maternal Aunt   . Stroke Maternal Aunt    the patient's mother died at the age of 82. The patient's father is alive at age 32. She had no brothers or sisters. Her father has a  history of prostate cancer. There is no history of breast or ovarian cancer in the family. One maternal first cousin has a history of Hodgkin's lymphoma.  GYNECOLOGIC HISTORY:  Menarche age 8, first live birth age 52. She is GX P1. She underwent total abdominal hysterectomy and bilateral salpingo-oophorectomy in 1997 she did not take hormone replacement.  SOCIAL HISTORY: (Updated February 2015) She is a Agricultural engineer. Her husband Arnette Norris farms approximately 500 acres. Daughter Colletta [place] lives in [place] where she works as a Pension scheme manager for a drug company. The patient has no grandchildren. She has two "grand cats". She attends a local united church of [place]: Not in place   HEALTH MAINTENANCE:  (Updated  February  2015) History  Substance Use Topics  . Smoking status: Never Smoker   . Smokeless tobacco: Never Used  . 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 Amoxicillin Itching  . Lasix [Furosemide] Nausea And Vomiting and Other (See Comments)    headache  . Lithium Other (See Comments)    Dizziness, "cause me to fall"  . Sulfa Antibiotics Hives  . Trazodone And Nefazodone Other (See Comments)    insomnia  . Lyrica [Pregabalin] Diarrhea, Nausea Only and Rash    Current Outpatient Prescriptions  Medication Sig Dispense Refill  . ALPRAZolam (XANAX) 0.5 MG tablet TAKE 1 TABLET BY MOUTH EVERY 8 HOURS AS NEEDED  30 tablet  0  . amitriptyline (ELAVIL) 150 MG tablet Take 150 mg by mouth at bedtime.      . Ascorbic Acid (VITAMIN C) 1000 MG tablet Take 1,000 mg by mouth daily.      Marland Kitchen buPROPion (WELLBUTRIN SR) 150 MG 12 hr tablet Take 150-300 mg by mouth 2 (two) times daily. Take 348m in the morning and 1516mat 2pm      . cetirizine (ZYRTEC) 10 MG tablet Take 10 mg by mouth daily.      . Cholecalciferol (VITAMIN D-3) 1000 UNITS CAPS Take 5,000 Units by mouth daily.      . cholestyramine (QUESTRAN) 4 G packet Take 1 packet (4 g total) by mouth 2 (two) times daily as needed (diarrhea). Take with meals.  40 each  1  . ibuprofen (ADVIL,MOTRIN) 200 MG tablet Take 800 mg by mouth every 6 (six) hours as needed for moderate pain.      . Marland Kitchenevothyroxine (SYNTHROID, LEVOTHROID) 50 MCG tablet Take 50 mcg by mouth daily before breakfast.      . lidocaine-prilocaine (EMLA) cream Apply 1 application topically as needed.  30 g  3  . metoprolol succinate (TOPROL-XL) 25 MG 24 hr tablet Take 25 mg by mouth daily.      . minoxidil (ROGAINE) 2 % external solution Apply 1 application topically 2 (two) times daily as needed.       . Multiple Vitamins-Minerals (HAIR VITAMINS PO) Take 1 tablet by mouth 2 (two) times daily.      . Omega-3 Fatty Acids (FISH OIL PO) Take 1 capsule by mouth daily.       . Marland Kitchenmeprazole (PRILOSEC) 40 MG capsule Take 1 capsule (40 mg total) by mouth every morning.  30  capsule  3  . Oxcarbazepine (TRILEPTAL) 300 MG tablet Take  300 mg by mouth 2 (two) times daily.      Marland Kitchen oxyCODONE-acetaminophen (PERCOCET/ROXICET) 5-325 MG per tablet Take 1 tablet by mouth every 4 (four) hours as needed for moderate pain.  40 tablet  0  . potassium chloride (MICRO-K) 10 MEQ CR capsule 2 tabs by mouth in the morning and 1 tab by mouth in the evening  90 capsule  3  . Vilazodone HCl (VIIBRYD) 20 MG TABS Take 1 tablet by mouth daily.      . vitamin B-12 (CYANOCOBALAMIN) 1000 MCG tablet Take 1,000 mcg by mouth daily.      . ondansetron (ZOFRAN) 8 MG tablet 1 tab by mouth at dinner on day of chemo, then 1 tab with breakfast and dinner on day after chemo.  Then 1 tab every 12 hrs PRN nausea  30 tablet  1  . prochlorperazine (COMPAZINE) 10 MG tablet Take 1 tablet (10 mg total) by mouth 4 (four) times daily -  before meals and at bedtime. Take before meals and at bedtime starting before supper  the evening of chemo and copntinue through the next day. Then take AS NEEDED before meals and at bedtime  30 tablet  1   No current facility-administered medications for this visit.    OBJECTIVE: 68 year old white woman who appears fatigued and anxious but is in no acute distress Filed Vitals:   10/25/13 1023  BP: 107/58  Pulse: 84  Temp: 98.5 F (36.9 C)  Resp: 18     Body mass index is 30.76 kg/(m^2).    ECOG FS:1 - Symptomatic but completely ambulatory Filed Weights   10/25/13 1023  Weight: 179 lb 4.8 oz (81.33 kg)   Physical Exam: HEENT:  Sclerae anicteric.  Oropharynx clear and moist. No visible ulcerations or evidence of oropharyngeal candidiasis. Neck is supple, trachea midline.  NODES:  No cervical or supraclavicular lymphadenopathy palpated.  BREAST EXAM:  Deferred. Axillae are benign bilaterally, with no palpable lymphadenopathy. LUNGS:  Clear to auscultation bilaterally with good excursion.  No wheezes or rhonchi HEART:  Regular rate and rhythm. No murmur  appreciated. ABDOMEN:  Soft, nontender. No organomegaly or masses palpated.  Positive bowel sounds.  MSK:  No focal spinal tenderness to palpation. Good range of motion bilaterally in the upper extremities. EXTREMITIES:  No peripheral edema.  No lymphedema noted in the upper extremities. SKIN:  No nail dyscrasia noted. There are some erythematous, dry lesions on the forearms which are improving. No additional rashes noted elsewhere. No pallor. NEURO:  Nonfocal. Well oriented.  Anxious affect.       LAB RESULTS:   Lab Results  Component Value Date   WBC 13.1* 10/25/2013   NEUTROABS 4.8 10/25/2013   HGB 10.2* 10/25/2013   HCT 32.1* 10/25/2013   MCV 83.0 10/25/2013   PLT 252 10/25/2013      Chemistry      Component Value Date/Time   NA 146* 10/25/2013 1009   NA 145 07/30/2013 0422   K 3.6 10/25/2013 1009   K 3.9 07/30/2013 0422   CL 111 07/30/2013 0422   CO2 25 10/25/2013 1009   CO2 25 07/30/2013 0422   BUN 10.2 10/25/2013 1009   BUN 6 07/30/2013 0422   CREATININE 0.9 10/25/2013 1009   CREATININE 0.76 07/30/2013 0422      Component Value Date/Time   CALCIUM 9.5 10/25/2013 1009   CALCIUM 8.3* 07/30/2013 0422   ALKPHOS 78 10/25/2013 1009   ALKPHOS 196* 06/04/2013 2221   AST 15 10/25/2013  1009   AST 30 06/04/2013 2221   ALT 25 10/25/2013 1009   ALT 45* 06/04/2013 2221   BILITOT 0.23 10/25/2013 1009   BILITOT 0.3 06/04/2013 2221      STUDIES:  An echocardiogram on 08/23/2013 showed an ejection fraction of 55-60%.  Due to be repeated in late April, 3 months after initiating therapy.   ASSESSMENT: 68 y.o. [place] woman  (1) status post right lumpectomy and axillary lymph node dissection in 1997 for a stage I breast cancer, treated with adjuvant radiation and tamoxifen for 5 years  (2) status post right breast biopsy 07/13/2013 for a clinical T1c N0, stage IA invasive ductal carcinoma, grade 2, estrogen receptor 100% positive, progesterone receptor 13% positive, with an MIB-1 of 33%, and HER-2  amplification by CISH with a HER2/CEP 17 ratio of 3.19, and an average HER-2 copy number per cell of 4.15  (3) status post right mastectomy 07/28/2013 for a pT1c pN0, stage IA invasive ductal carcinoma, grade 3, with close but negative margins. Prognostic panel was not repeated  (4) to receive weekly paclitaxel x12 with trastuzumab/ pertuzumab every 3 weeks x one year starting 09/20/2048   OTHER PROBLEMS:  (a) History of chronic lymphoid leukemia diagnosed by flow cytometry 06/29/2013, the cells being CD5, CD20 and CD23 positive, CD10 negative. Her absolute lymphocyte count slightly increased from 5000 to  5700. We'll closely monitor lymphocyte count if needed, we will order the repeat peripheral blood flow cytometry probably in 3 months.  (b) Anemia with a normal MCV and normal ferritin  (c) genetics testing pending  (d) the patient may consider eventual reconstruction   PLAN: Ghada will proceed with  infusion today for day 15 cycle 2 of therapy, due for paclitaxel alone. Her metabolic panel is pending, and we will follow that closely for her hypokalemia in light of her frequent bowel movements. I am also adding a magnesium level today to assess for hypomagnesemia. She continues on her oral potassium supplement, 20 mEq in the morning and 10 mEq in the evening. I have encouraged her to keep herself well hydrated. She'll continue on the Questran twice daily, and I have recommended that she increase the Imodium to one tablet twice daily in an attempt to decrease the number of bowel movements she is having daily. She was given these instructions in writing today.  We will continue to see her on a weekly basis with each dose of chemotherapy, and she is scheduled for day 1 cycle 3 next week on March 10. On that day, she will be due for all 3 agents, paclitaxel, trastuzumab, and pertuzumab together.  All the above was reviewed with Marcie Bal and she voices her understanding and agreement with this plan.   She will call with any changes or problems prior to her appointment here next week.   Durene Fruits   Medical oncology  10/25/2013 10:59 AM

## 2013-11-01 ENCOUNTER — Other Ambulatory Visit: Payer: Self-pay

## 2013-11-01 ENCOUNTER — Ambulatory Visit (HOSPITAL_BASED_OUTPATIENT_CLINIC_OR_DEPARTMENT_OTHER): Payer: Medicare Other | Admitting: Hematology and Oncology

## 2013-11-01 ENCOUNTER — Encounter: Payer: Self-pay | Admitting: Hematology and Oncology

## 2013-11-01 ENCOUNTER — Ambulatory Visit (HOSPITAL_BASED_OUTPATIENT_CLINIC_OR_DEPARTMENT_OTHER): Payer: Medicare Other

## 2013-11-01 ENCOUNTER — Other Ambulatory Visit (HOSPITAL_BASED_OUTPATIENT_CLINIC_OR_DEPARTMENT_OTHER): Payer: Medicare Other

## 2013-11-01 VITALS — BP 111/69 | HR 87 | Temp 98.3°F | Resp 18 | Ht 64.0 in | Wt 178.1 lb

## 2013-11-01 DIAGNOSIS — Z170 Estrogen receptor positive status [ER+]: Secondary | ICD-10-CM

## 2013-11-01 DIAGNOSIS — R197 Diarrhea, unspecified: Secondary | ICD-10-CM

## 2013-11-01 DIAGNOSIS — C50511 Malignant neoplasm of lower-outer quadrant of right female breast: Secondary | ICD-10-CM

## 2013-11-01 DIAGNOSIS — N1330 Unspecified hydronephrosis: Secondary | ICD-10-CM

## 2013-11-01 DIAGNOSIS — D649 Anemia, unspecified: Secondary | ICD-10-CM

## 2013-11-01 DIAGNOSIS — Z5111 Encounter for antineoplastic chemotherapy: Secondary | ICD-10-CM

## 2013-11-01 DIAGNOSIS — E876 Hypokalemia: Secondary | ICD-10-CM

## 2013-11-01 DIAGNOSIS — N200 Calculus of kidney: Secondary | ICD-10-CM

## 2013-11-01 DIAGNOSIS — C50519 Malignant neoplasm of lower-outer quadrant of unspecified female breast: Secondary | ICD-10-CM

## 2013-11-01 DIAGNOSIS — C50919 Malignant neoplasm of unspecified site of unspecified female breast: Secondary | ICD-10-CM

## 2013-11-01 DIAGNOSIS — C50911 Malignant neoplasm of unspecified site of right female breast: Secondary | ICD-10-CM

## 2013-11-01 DIAGNOSIS — C9110 Chronic lymphocytic leukemia of B-cell type not having achieved remission: Secondary | ICD-10-CM

## 2013-11-01 DIAGNOSIS — G609 Hereditary and idiopathic neuropathy, unspecified: Secondary | ICD-10-CM

## 2013-11-01 DIAGNOSIS — Z5112 Encounter for antineoplastic immunotherapy: Secondary | ICD-10-CM

## 2013-11-01 LAB — COMPREHENSIVE METABOLIC PANEL (CC13)
ALT: 22 U/L (ref 0–55)
AST: 17 U/L (ref 5–34)
Albumin: 3.9 g/dL (ref 3.5–5.0)
Alkaline Phosphatase: 79 U/L (ref 40–150)
Anion Gap: 10 mEq/L (ref 3–11)
BUN: 8.6 mg/dL (ref 7.0–26.0)
CALCIUM: 9.8 mg/dL (ref 8.4–10.4)
CHLORIDE: 108 meq/L (ref 98–109)
CO2: 23 mEq/L (ref 22–29)
Creatinine: 0.8 mg/dL (ref 0.6–1.1)
Glucose: 126 mg/dl (ref 70–140)
POTASSIUM: 3.7 meq/L (ref 3.5–5.1)
Sodium: 142 mEq/L (ref 136–145)
Total Bilirubin: <0.2 mg/dL (ref 0.20–1.20)
Total Protein: 6.4 g/dL (ref 6.4–8.3)

## 2013-11-01 LAB — CBC WITH DIFFERENTIAL/PLATELET
BASO%: 1 % (ref 0.0–2.0)
Basophils Absolute: 0.1 10*3/uL (ref 0.0–0.1)
EOS%: 2.9 % (ref 0.0–7.0)
Eosinophils Absolute: 0.3 10*3/uL (ref 0.0–0.5)
HCT: 32.9 % — ABNORMAL LOW (ref 34.8–46.6)
HEMOGLOBIN: 10.7 g/dL — AB (ref 11.6–15.9)
LYMPH#: 7.4 10*3/uL — AB (ref 0.9–3.3)
LYMPH%: 63.4 % — ABNORMAL HIGH (ref 14.0–49.7)
MCH: 27 pg (ref 25.1–34.0)
MCHC: 32.5 g/dL (ref 31.5–36.0)
MCV: 83.1 fL (ref 79.5–101.0)
MONO#: 0.4 10*3/uL (ref 0.1–0.9)
MONO%: 3.3 % (ref 0.0–14.0)
NEUT#: 3.4 10*3/uL (ref 1.5–6.5)
NEUT%: 29.4 % — ABNORMAL LOW (ref 38.4–76.8)
Platelets: 265 10*3/uL (ref 145–400)
RBC: 3.96 10*6/uL (ref 3.70–5.45)
RDW: 19.4 % — ABNORMAL HIGH (ref 11.2–14.5)
WBC: 11.7 10*3/uL — ABNORMAL HIGH (ref 3.9–10.3)

## 2013-11-01 MED ORDER — DIPHENHYDRAMINE HCL 50 MG/ML IJ SOLN
25.0000 mg | Freq: Once | INTRAMUSCULAR | Status: AC
  Administered 2013-11-01: 25 mg via INTRAVENOUS

## 2013-11-01 MED ORDER — DIPHENHYDRAMINE HCL 25 MG PO CAPS
ORAL_CAPSULE | ORAL | Status: AC
  Filled 2013-11-01: qty 1

## 2013-11-01 MED ORDER — HEPARIN SOD (PORK) LOCK FLUSH 100 UNIT/ML IV SOLN
500.0000 [IU] | Freq: Once | INTRAVENOUS | Status: AC | PRN
  Administered 2013-11-01: 500 [IU]
  Filled 2013-11-01: qty 5

## 2013-11-01 MED ORDER — DIPHENHYDRAMINE HCL 25 MG PO CAPS
25.0000 mg | ORAL_CAPSULE | Freq: Once | ORAL | Status: AC
  Administered 2013-11-01: 25 mg via ORAL

## 2013-11-01 MED ORDER — DIPHENHYDRAMINE HCL 50 MG/ML IJ SOLN
INTRAMUSCULAR | Status: AC
  Filled 2013-11-01: qty 1

## 2013-11-01 MED ORDER — DEXAMETHASONE SODIUM PHOSPHATE 20 MG/5ML IJ SOLN
INTRAMUSCULAR | Status: AC
  Filled 2013-11-01: qty 5

## 2013-11-01 MED ORDER — ONDANSETRON 8 MG/50ML IVPB (CHCC)
8.0000 mg | Freq: Once | INTRAVENOUS | Status: AC
  Administered 2013-11-01: 8 mg via INTRAVENOUS

## 2013-11-01 MED ORDER — SODIUM CHLORIDE 0.9 % IV SOLN
Freq: Once | INTRAVENOUS | Status: AC
  Administered 2013-11-01: 11:00:00 via INTRAVENOUS

## 2013-11-01 MED ORDER — PACLITAXEL CHEMO INJECTION 300 MG/50ML
80.0000 mg/m2 | Freq: Once | INTRAVENOUS | Status: AC
  Administered 2013-11-01: 156 mg via INTRAVENOUS
  Filled 2013-11-01: qty 26

## 2013-11-01 MED ORDER — DEXAMETHASONE SODIUM PHOSPHATE 20 MG/5ML IJ SOLN
20.0000 mg | Freq: Once | INTRAMUSCULAR | Status: AC
  Administered 2013-11-01: 20 mg via INTRAVENOUS

## 2013-11-01 MED ORDER — ACETAMINOPHEN 325 MG PO TABS
ORAL_TABLET | ORAL | Status: AC
  Filled 2013-11-01: qty 2

## 2013-11-01 MED ORDER — SODIUM CHLORIDE 0.9 % IV SOLN
6.0000 mg/kg | Freq: Once | INTRAVENOUS | Status: AC
  Administered 2013-11-01: 483 mg via INTRAVENOUS
  Filled 2013-11-01: qty 23

## 2013-11-01 MED ORDER — SODIUM CHLORIDE 0.9 % IJ SOLN
10.0000 mL | INTRAMUSCULAR | Status: DC | PRN
  Administered 2013-11-01: 10 mL
  Filled 2013-11-01: qty 10

## 2013-11-01 MED ORDER — ONDANSETRON 8 MG/NS 50 ML IVPB
INTRAVENOUS | Status: AC
  Filled 2013-11-01: qty 8

## 2013-11-01 MED ORDER — FAMOTIDINE IN NACL 20-0.9 MG/50ML-% IV SOLN
20.0000 mg | Freq: Once | INTRAVENOUS | Status: AC
  Administered 2013-11-01: 20 mg via INTRAVENOUS

## 2013-11-01 MED ORDER — ACETAMINOPHEN 325 MG PO TABS
650.0000 mg | ORAL_TABLET | Freq: Once | ORAL | Status: AC
  Administered 2013-11-01: 650 mg via ORAL

## 2013-11-01 MED ORDER — FAMOTIDINE IN NACL 20-0.9 MG/50ML-% IV SOLN
INTRAVENOUS | Status: AC
  Filled 2013-11-01: qty 50

## 2013-11-01 NOTE — Patient Instructions (Signed)
[facility] Discharge Instructions for Patients Receiving Chemotherapy  Today you received the following chemotherapy agents: Herceptin, Taxol Your Perjeta was held today due to your diarrhea.   To help prevent nausea and vomiting after your treatment, we encourage you to take your nausea medication as prescribed by your physician.    If you develop nausea and vomiting that is not controlled by your nausea medication, call the clinic.   BELOW ARE SYMPTOMS THAT SHOULD BE REPORTED IMMEDIATELY:  *FEVER GREATER THAN 100.5 F  *CHILLS WITH OR WITHOUT FEVER  NAUSEA AND VOMITING THAT IS NOT CONTROLLED WITH YOUR NAUSEA MEDICATION  *UNUSUAL SHORTNESS OF BREATH  *UNUSUAL BRUISING OR BLEEDING  TENDERNESS IN MOUTH AND THROAT WITH OR WITHOUT PRESENCE OF ULCERS  *URINARY PROBLEMS  *BOWEL PROBLEMS  UNUSUAL RASH Items with * indicate a potential emergency and should be followed up as soon as possible.  Feel free to call the clinic you have any questions or concerns. The clinic phone number is [phone].

## 2013-11-01 NOTE — Progress Notes (Signed)
. IDAlena Bills OB: 1946/04/09  MR#: [id number]  CSN#:[id number]  PCP: [name], MD GYN:  M. Edwinna Areola SU: Donnie Mesa, Lowella Bandy,  OTHER MD: Christene Slates, Dian Situ, Page Spiro Dixon  CHIEF COMPLAINT: For initiation of chemotherapy with Taxol, Herceptin and Perjeta   DIAGNOSIS: Right Breast Cancer  HISTORY OF PRESENT ILLNESS: As per previously documented note:  Jermani has a history of breast cancer dating back to 1997, [name] performed a right lumpectomy and axillary lymph node dissection for what according to the patient and her family was a stage I invasive ductal breast cancer. She received radiation adjuvantly and then took tamoxifen for 5 years  In February of 2014 she had a normal screening mammography, but in November 2014 she felt a "hard lump" in her right breast. She brought this to the attention of her urologist, Dr. Izora Gala, and he set her up for bilateral diagnostic mammography with mammography at [facility] 07/13/2013. This showed her breast density to be category B. There was no mammographic abnormality noted but ultrasonography showed a 1.4 cm irregularly shaped solid mass in the right breast at the 8:00 position. This was biopsied the same day, and showed ([id number]) an invasive ductal carcinoma, grade 2, estrogen receptor 100% positive, progesterone receptor 13% positive, with an MIB-1 of 33% and HER-2 amplification with a HER-2: CEP 17 ratio of 3.19, and a HER-2 copy number percent all of 4.15.  The patient's subsequent history is as detailed below  INTERVAL HISTORY:  Patient returns today with her husband for  week 4 of Taxol and cycle #3 of  Herceptin and Perjeta for her right breast cancer.  Cycle#2 Perjeta was decreased by 50%(420 mg) due to diarrhea. Patient still reports diarrhea up to 10 times for one day and 4-6 BMs for several days following the #2 cycle She received IV hydration.She now states her diarrhea is  resolved.She used Questran and Immodium.She denies fever or abdominal pain or vimiting .She denies skin rashes . She reports mild cough and postnasal drip.Also blooby nasal secretions and nasal drainage but not colored at present. She denies hematuria or difficulty urinating.  She complains over the past few days left hip/left pelvic pain and she thinks her arthritis is acting up .She denies low back pain.She has history of 3 prior ruptured disks and steroid injections under [facility].Also seen Rheumatology and Orthopedics previously for spine arthritis and fibromyalgia.   Her appetite is good. Denies  chest pain, palpitations.She reports dyspnea with exertion,denies leg edema Energy has declined.She reports numbness and tingling of her feet and toe.  REVIEW OF SYSTEMS: A 14 point review of systems is been assessed and the pertinent findings are mentioned in interval history   PAST MEDICAL HISTORY: Past Medical History  Diagnosis Date  . Sinus tachycardia     mild resting  . HLD (hyperlipidemia)   . Orthostatic hypotension     slight in the past  . Anxiety   . Depression   . Diverticulosis   . Fibromyalgia 1978  . Hypothyroidism   . Alopecia   . Ruptured disk     one in neck and two in back  . Plantar fasciitis   . GERD (gastroesophageal reflux disease)   . History of syncope     episode 2008--  no recurrence since  . Ureteral calculi     bilateral  . Leukocytosis   . Chronic back pain     "lower  back and upper neck" (07/28/2013)  . 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 ear   . Urgency of urination   . Hematuria   . Sensation of pressure in bladder area   . Wears glasses   . Ecchymosis     FROM IV AND LAB WORK THE PAST WEEK  06-17-2013  . Stool incontinence     "at times recently"   . Kidney stones 2014  . CAP (community acquired pneumonia)     admission 06-04-2013 secondary to failed oral antibitotics---  last cxr 06-16-2013  much improved  . SOB (shortness of breath)  06-17-2013 CURRENTLY RESIDUAL FROM RECENT CAP    SOB from obesity and deconditioning 2009- eval included CPX  . Arthritis     "spine" (07/28/2013)  . DDD (degenerative disc disease)   . CLL (chronic lymphocytic leukemia) dx'd 05/2013    "I don't think I have it though" (07/28/2013)  . Breast cancer 1997; 2014    right    PAST SURGICAL HISTORY: Past Surgical History  Procedure Laterality Date  . Lumbar epidural injection      has had 7 injections  . Total abdominal hysterectomy w/ bilateral salpingoophorectomy  1997  . Breast lumpectomy with axillary lymph node dissection Right 1997  . Transthoracic echocardiogram  05-11-2007  dr Ron Parker    normal lvf/  ef 65%  . Tonsillectomy  AGE 83  . Retinal detachment surgery Right 2013  . Cystoscopy with retrograde pyelogram, ureteroscopy and stent placement Bilateral 06/17/2013    Procedure: CYSTOSCOPY WITH RETROGRADE PYELOGRAM, URETEROSCOPY AND LEFT DOUBLE  J STENT PLACEMENT RIGHT URETERAL HOLMIIUM LASER AND DOUBLE J STENT ;  Surgeon: Hanley Ben, MD;  Location: [facility];  Service: Urology;  Laterality: Bilateral;  . Holmium laser application Bilateral [id number]/[id number]    Procedure: HOLMIUM LASER APPLICATION;  Surgeon: Hanley Ben, MD;  Location: [place];  Service: Urology;  Laterality: Bilateral;  . Mastectomy w/ nodes partial Right 1997  . Mastectomy complete / simple Right 07/28/2013  . Appendectomy  1997  . Breast biopsy Right 2014  . Cystoscopy with ureteroscopy, stone basketry and stent placement Bilateral 2014  . Lithotripsy Left ~ 06/2013  . Simple mastectomy with axillary sentinel node biopsy Right 07/28/2013    Procedure: RIGHT TOTAL  MASTECTOMY;  Surgeon: Imogene Burn. Georgette Dover, MD;  Location: [place];  Service: General;  Laterality: Right;  . Portacath placement Left 07/28/2013    Procedure: ATTEMPTED INSERTION PORT-A-CATH;  Surgeon: Imogene Burn. Georgette Dover, MD;  Location: [place] [facility];  Service: General;  Laterality:  Left;    FAMILY HISTORY Family History  Problem Relation Age of Onset  . Heart disease Maternal Grandfather   . Diabetes Maternal Grandfather   . Colon cancer Maternal Aunt   . Other Paternal Grandmother     brain tumor  . Dementia Mother   . Diabetes Mother   . Osteoporosis Mother   . Diabetes Maternal Aunt   . Prostate cancer Father   . Brain cancer Maternal Grandmother   . Bipolar disorder Maternal Aunt   . Stroke Maternal Aunt    the patient's mother died at the age of 35. The patient's father is alive at age 38. She had no brothers or sisters. Her father has a history of prostate cancer. There is no history of breast or ovarian cancer in the family. One maternal first cousin has a history of Hodgkin's lymphoma.  GYNECOLOGIC HISTORY:  Menarche age 75, first live birth age 66. She is GX P1. She  underwent total abdominal hysterectomy and bilateral salpingo-oophorectomy in 1997 she did not take hormone replacement.  SOCIAL HISTORY: (Updated January 2015) She is a Agricultural engineer. Her husband Arnette Norris farms approximately 500 acres. Daughter Colletta [place] lives in [place] where she works as a Pension scheme manager for a drug company. The patient has no grandchildren. She has two "grand cats". She attends a local [employer] of [place]: Not in place   HEALTH MAINTENANCE:  (Updated January 2015) History  Substance Use Topics  . Smoking status: Never Smoker   . Smokeless tobacco: Never Used  . Alcohol Use: Yes     Comment: 07/28/2013 "no alcohol since 1994; never had problem w/it"     Colonoscopy: 2009/ [facility]  PAP: Status post hysterectomy  Bone density: May 10/14/2009 at [facility] was normal  Lipid panel:  Not on file   Allergies  Allergen Reactions  . Amoxicillin Itching  . Lasix [Furosemide] Nausea And Vomiting and Other (See Comments)    headache  . Lithium Other (See Comments)    Dizziness, "cause me to fall"  . Sulfa Antibiotics Hives   . Trazodone And Nefazodone Other (See Comments)    insomnia  . Lyrica [Pregabalin] Diarrhea, Nausea Only and Rash    Current Outpatient Prescriptions  Medication Sig Dispense Refill  . ALPRAZolam (XANAX) 0.5 MG tablet TAKE 1 TABLET BY MOUTH EVERY 8 HOURS AS NEEDED  30 tablet  0  . amitriptyline (ELAVIL) 150 MG tablet Take 150 mg by mouth at bedtime.      . Ascorbic Acid (VITAMIN C) 1000 MG tablet Take 1,000 mg by mouth daily.      Marland Kitchen buPROPion (WELLBUTRIN SR) 150 MG 12 hr tablet Take 150-300 mg by mouth 2 (two) times daily. Take 330m in the morning and 1537mat 2pm      . cetirizine (ZYRTEC) 10 MG tablet Take 10 mg by mouth daily.      . Cholecalciferol (VITAMIN D-3) 1000 UNITS CAPS Take 5,000 Units by mouth daily.      . cholestyramine (QUESTRAN) 4 G packet Take 1 packet (4 g total) by mouth 2 (two) times daily as needed (diarrhea). Take with meals.  40 each  1  . ibuprofen (ADVIL,MOTRIN) 200 MG tablet Take 800 mg by mouth every 6 (six) hours as needed for moderate pain.      . Marland Kitchenevothyroxine (SYNTHROID, LEVOTHROID) 50 MCG tablet Take 50 mcg by mouth daily before breakfast.      . lidocaine-prilocaine (EMLA) cream Apply 1 application topically as needed.  30 g  3  . metoprolol succinate (TOPROL-XL) 25 MG 24 hr tablet Take 25 mg by mouth daily.      . minoxidil (ROGAINE) 2 % external solution Apply 1 application topically 2 (two) times daily as needed.       . Multiple Vitamins-Minerals (HAIR VITAMINS PO) Take 1 tablet by mouth 2 (two) times daily.      . Omega-3 Fatty Acids (FISH OIL PO) Take 1 capsule by mouth daily.       . Marland Kitchenmeprazole (PRILOSEC) 40 MG capsule Take 1 capsule (40 mg total) by mouth every morning.  30 capsule  3  . ondansetron (ZOFRAN) 8 MG tablet 1 tab by mouth at dinner on day of chemo, then 1 tab with breakfast and dinner on day after chemo.  Then 1 tab every 12 hrs PRN nausea  30 tablet  1  . Oxcarbazepine (TRILEPTAL) 300 MG tablet Take 300 mg by mouth  2 (two) times  daily.      Marland Kitchen oxyCODONE-acetaminophen (PERCOCET/ROXICET) 5-325 MG per tablet Take 1 tablet by mouth every 4 (four) hours as needed for moderate pain.  40 tablet  0  . potassium chloride (MICRO-K) 10 MEQ CR capsule 2 tabs by mouth in the morning and 1 tab by mouth in the evening  90 capsule  3  . prochlorperazine (COMPAZINE) 10 MG tablet Take 1 tablet (10 mg total) by mouth 4 (four) times daily -  before meals and at bedtime. Take before meals and at bedtime starting before supper  the evening of chemo and copntinue through the next day. Then take AS NEEDED before meals and at bedtime  30 tablet  1  . Vilazodone HCl (VIIBRYD) 20 MG TABS Take 1 tablet by mouth daily.      . vitamin B-12 (CYANOCOBALAMIN) 1000 MCG tablet Take 1,000 mcg by mouth daily.       No current facility-administered medications for this visit.   Facility-Administered Medications Ordered in Other Visits  Medication Dose Route Frequency Provider Last Rate Last Dose  . 0.9 %  sodium chloride infusion   Intravenous Once Amy G Berry, PA-C      . heparin lock flush 100 unit/mL  500 Units Intracatheter Once PRN Amy Milda Smart, PA-C      . PACLitaxel (TAXOL) 156 mg in sodium chloride 0.9 % 250 mL chemo infusion (</= 68m/m2)  80 mg/m2 (Treatment Plan Actual) Intravenous Once Amy G Berry, PA-C      . sodium chloride 0.9 % injection 10 mL  10 mL Intracatheter PRN Amy G Berry, PA-C      . trastuzumab (HERCEPTIN) 483 mg in sodium chloride 0.9 % 250 mL chemo infusion  6 mg/kg (Treatment Plan Actual) Intravenous Once GChauncey Cruel MD 546 mL/hr at 11/01/13 1121 483 mg at 11/01/13 1121    OBJECTIVE: 68year old white woman in no acute distress Filed Vitals:   11/01/13 0914  BP: 111/69  Pulse: 87  Temp: 98.3 F (36.8 C)  Resp: 18     Body mass index is 30.56 kg/(m^2).    ECOG FS:1 - Symptomatic but completely ambulatory Filed Weights   11/01/13 0914  Weight: 178 lb 1.6 oz (80.786 kg)   Physical Exam: HEENT:  Sclerae anicteric,  conjunctiva no fever, neck supple, no JVD, no thyromegaly.  Oropharynx clear and moist. No ulcerations.  No oropharyngeal candidiasis.  NODES:  No cervical or supraclavicular lymphadenopathy  BREAST EXAM: Right breast -status post mastectomy scarwithout any masses Left breast no masses   Axillaebenign bilaterally,no palpable lymphadenopathy. LUNGS:  Clear to auscultation bilaterally  No wheezes, rales, or rhonchi HEART:  Regular rate and rhythm. No murmur  ABDOMEN:  Soft, nontender to palpation. No organomegaly or masses Positive bowel sounds.  MSK: mild  focal spinal tenderness on percussion of lower spine. Good range of motion bilaterally in the upper extremities.left leg extension againgt resistance produces pain left hip area EXTREMITIES:  No peripheral edema, no cyanosis, no clubbing   SKIN Dry   NEURO:  Nonfocal,Well oriented. Appropriate mood and affect.Appears forgetful.     LAB RESULTS:   Lab Results  Component Value Date   WBC 11.7* 11/01/2013   NEUTROABS 3.4 11/01/2013   HGB 10.7* 11/01/2013   HCT 32.9* 11/01/2013   MCV 83.1 11/01/2013   PLT 265 11/01/2013      Chemistry      Component Value Date/Time   NA 142 11/01/2013 0845   NA  145 07/30/2013 0422   K 3.7 11/01/2013 0845   K 3.9 07/30/2013 0422   CL 111 07/30/2013 0422   CO2 23 11/01/2013 0845   CO2 25 07/30/2013 0422   BUN 8.6 11/01/2013 0845   BUN 6 07/30/2013 0422   CREATININE 0.8 11/01/2013 0845   CREATININE 0.76 07/30/2013 0422      Component Value Date/Time   CALCIUM 9.8 11/01/2013 0845   CALCIUM 8.3* 07/30/2013 0422   ALKPHOS 79 11/01/2013 0845   ALKPHOS 196* 06/04/2013 2221   AST 17 11/01/2013 0845   AST 30 06/04/2013 2221   ALT 22 11/01/2013 0845   ALT 45* 06/04/2013 2221   BILITOT <0.20 11/01/2013 0845   BILITOT 0.3 06/04/2013 2221      STUDIES:  An echocardiogram on 08/23/2013 showed an ejection fraction of 55-60%.  Due to be repeated in late April, 3 months after initiating therapy.   ASSESSMENT: 69  y.o. female  (1) status post right lumpectomy and axillary lymph node dissection in 1997 for a stage I breast cancer, treated with adjuvant radiation and tamoxifen for 5 years  (2) status post right breast biopsy 07/13/2013 for a clinical T1c N0, stage IA invasive ductal carcinoma, grade 2, estrogen receptor 100% positive, progesterone receptor 13% positive, with an MIB-1 of 33%, and HER-2 amplification by CISH with a HER2/CEP 17 ratio of 3.19, and an average HER-2 copy number per cell of 4.15  (3) status post right mastectomy 07/28/2013 for a pT1c pN0, stage IA invasive ductal carcinoma, grade 3, with close but negative margins. Prognostic panel was not repeated  (4) to receive weekly paclitaxel x12 with trastuzumab/ pertuzumab every 3 weeks x one year starting 09/20/2013   OTHER PROBLEMS:  (a) History of chronic lymphoid leukemia diagnosed by flow cytometry 11/05/2014CD5, CD20 and CD23 positive, CD10 negative. Absolute lymphocyte count slightly increased  (b) Anemia with a normal MCV and normal ferritin  (c) genetics testing pending  (d) the patient may consider eventual reconstruction   PLAN: 1.Breast cancer as above     Will hold today from cycle#3 the Perjeta, already reduced dose, due to ongoing problems with diarrhea.    Also hold Perjeta from cycle #4 .    Will administer today Herceptin/Taxol only.      Follow up 1 week  for Taxol treatment,CBC,CMP,Vit D level.  2.Mild neuropathy  monitor  3.Reports progressive dyspnea will order ECHO  prior to next Herceptin treatment in 3 weeks.  4.Pelvic/hip pain unclear etiology will check pelvic/hip plain films consider bone scan.  5.She has bilateral kidney stones and hydronephrosis,will refer to Urology creat stable.  6.CLL recently diagnosed with mild lymphocytosis,anemia(could be multifactorial).Needs observation without    additional testing unless progression of anemia,thrombocytopenia or lymphadenopathy.Recent CT abdomen  no  splenomegaly.    Recent pneumonia fall 2014 with ongoing sinus problems will check IgG level.                       Amada Kingfisher, MD   Medical oncology  11/01/2013 11:45 AM

## 2013-11-02 ENCOUNTER — Other Ambulatory Visit: Payer: Self-pay

## 2013-11-02 ENCOUNTER — Telehealth: Payer: Self-pay | Admitting: Physician Assistant

## 2013-11-02 NOTE — Telephone Encounter (Signed)
, °

## 2013-11-03 ENCOUNTER — Other Ambulatory Visit: Payer: Self-pay

## 2013-11-05 ENCOUNTER — Other Ambulatory Visit: Payer: Self-pay | Admitting: Physician Assistant

## 2013-11-05 ENCOUNTER — Other Ambulatory Visit: Payer: Self-pay | Admitting: Oncology

## 2013-11-07 ENCOUNTER — Other Ambulatory Visit: Payer: Self-pay | Admitting: *Deleted

## 2013-11-07 MED ORDER — PROCHLORPERAZINE MALEATE 10 MG PO TABS: 10.0000 mg | ORAL_TABLET | Freq: Three times a day (TID) | ORAL | Status: DC

## 2013-11-07 NOTE — Telephone Encounter (Signed)
Attempted to e-scribe compazine refill X 2 and it printed twice. Script called in.

## 2013-11-07 NOTE — Telephone Encounter (Signed)
Medication refilled per protocol. 

## 2013-11-08 ENCOUNTER — Telehealth: Payer: Self-pay | Admitting: *Deleted

## 2013-11-08 ENCOUNTER — Encounter: Payer: Self-pay | Admitting: Physician Assistant

## 2013-11-08 ENCOUNTER — Ambulatory Visit (HOSPITAL_COMMUNITY)
Admission: RE | Admit: 2013-11-08 | Discharge: 2013-11-08 | Disposition: A | Discharge status: Home / Self Care | Payer: Medicare Other | Source: Ambulatory Visit | Attending: Physician Assistant | Admitting: Physician Assistant

## 2013-11-08 ENCOUNTER — Ambulatory Visit (HOSPITAL_BASED_OUTPATIENT_CLINIC_OR_DEPARTMENT_OTHER): Payer: Medicare Other

## 2013-11-08 ENCOUNTER — Telehealth: Payer: Self-pay | Admitting: Oncology

## 2013-11-08 ENCOUNTER — Other Ambulatory Visit (HOSPITAL_BASED_OUTPATIENT_CLINIC_OR_DEPARTMENT_OTHER): Payer: Medicare Other

## 2013-11-08 ENCOUNTER — Ambulatory Visit (HOSPITAL_BASED_OUTPATIENT_CLINIC_OR_DEPARTMENT_OTHER): Payer: Medicare Other | Admitting: Physician Assistant

## 2013-11-08 VITALS — BP 123/79 | HR 101 | Temp 99.1°F | Resp 18 | Ht 64.0 in | Wt 177.2 lb

## 2013-11-08 DIAGNOSIS — D649 Anemia, unspecified: Secondary | ICD-10-CM

## 2013-11-08 DIAGNOSIS — M25552 Pain in left hip: Secondary | ICD-10-CM

## 2013-11-08 DIAGNOSIS — C50911 Malignant neoplasm of unspecified site of right female breast: Secondary | ICD-10-CM

## 2013-11-08 DIAGNOSIS — R197 Diarrhea, unspecified: Secondary | ICD-10-CM

## 2013-11-08 DIAGNOSIS — Z87448 Personal history of other diseases of urinary system: Secondary | ICD-10-CM

## 2013-11-08 DIAGNOSIS — C50919 Malignant neoplasm of unspecified site of unspecified female breast: Secondary | ICD-10-CM

## 2013-11-08 DIAGNOSIS — C9110 Chronic lymphocytic leukemia of B-cell type not having achieved remission: Secondary | ICD-10-CM

## 2013-11-08 DIAGNOSIS — C50511 Malignant neoplasm of lower-outer quadrant of right female breast: Secondary | ICD-10-CM

## 2013-11-08 DIAGNOSIS — R0602 Shortness of breath: Secondary | ICD-10-CM

## 2013-11-08 DIAGNOSIS — Z9010 Acquired absence of unspecified breast and nipple: Secondary | ICD-10-CM | POA: Insufficient documentation

## 2013-11-08 DIAGNOSIS — M1990 Unspecified osteoarthritis, unspecified site: Secondary | ICD-10-CM

## 2013-11-08 DIAGNOSIS — M51379 Other intervertebral disc degeneration, lumbosacral region without mention of lumbar back pain or lower extremity pain: Secondary | ICD-10-CM | POA: Insufficient documentation

## 2013-11-08 DIAGNOSIS — M47817 Spondylosis without myelopathy or radiculopathy, lumbosacral region: Secondary | ICD-10-CM | POA: Insufficient documentation

## 2013-11-08 DIAGNOSIS — Z9221 Personal history of antineoplastic chemotherapy: Secondary | ICD-10-CM | POA: Insufficient documentation

## 2013-11-08 DIAGNOSIS — M549 Dorsalgia, unspecified: Secondary | ICD-10-CM | POA: Insufficient documentation

## 2013-11-08 DIAGNOSIS — M5137 Other intervertebral disc degeneration, lumbosacral region: Secondary | ICD-10-CM | POA: Insufficient documentation

## 2013-11-08 DIAGNOSIS — M25559 Pain in unspecified hip: Secondary | ICD-10-CM | POA: Insufficient documentation

## 2013-11-08 DIAGNOSIS — C50519 Malignant neoplasm of lower-outer quadrant of unspecified female breast: Secondary | ICD-10-CM

## 2013-11-08 DIAGNOSIS — IMO0002 Reserved for concepts with insufficient information to code with codable children: Secondary | ICD-10-CM

## 2013-11-08 DIAGNOSIS — Z5111 Encounter for antineoplastic chemotherapy: Secondary | ICD-10-CM

## 2013-11-08 DIAGNOSIS — E876 Hypokalemia: Secondary | ICD-10-CM

## 2013-11-08 DIAGNOSIS — F419 Anxiety disorder, unspecified: Secondary | ICD-10-CM

## 2013-11-08 LAB — CBC WITH DIFFERENTIAL/PLATELET
BASO%: 1.4 % (ref 0.0–2.0)
Basophils Absolute: 0.1 10*3/uL (ref 0.0–0.1)
EOS%: 2.3 % (ref 0.0–7.0)
Eosinophils Absolute: 0.2 10*3/uL (ref 0.0–0.5)
HCT: 34.9 % (ref 34.8–46.6)
HGB: 11.4 g/dL — ABNORMAL LOW (ref 11.6–15.9)
LYMPH#: 5.5 10*3/uL — AB (ref 0.9–3.3)
LYMPH%: 52.6 % — AB (ref 14.0–49.7)
MCH: 27.3 pg (ref 25.1–34.0)
MCHC: 32.5 g/dL (ref 31.5–36.0)
MCV: 83.9 fL (ref 79.5–101.0)
MONO#: 0.4 10*3/uL (ref 0.1–0.9)
MONO%: 3.5 % (ref 0.0–14.0)
NEUT#: 4.2 10*3/uL (ref 1.5–6.5)
NEUT%: 40.2 % (ref 38.4–76.8)
PLATELETS: 318 10*3/uL (ref 145–400)
RBC: 4.16 10*6/uL (ref 3.70–5.45)
RDW: 20.6 % — AB (ref 11.2–14.5)
WBC: 10.4 10*3/uL — AB (ref 3.9–10.3)

## 2013-11-08 LAB — COMPREHENSIVE METABOLIC PANEL (CC13)
ALT: 32 U/L (ref 0–55)
ANION GAP: 12 meq/L — AB (ref 3–11)
AST: 18 U/L (ref 5–34)
Albumin: 4.2 g/dL (ref 3.5–5.0)
Alkaline Phosphatase: 83 U/L (ref 40–150)
BILIRUBIN TOTAL: 0.26 mg/dL (ref 0.20–1.20)
BUN: 13.1 mg/dL (ref 7.0–26.0)
CO2: 23 meq/L (ref 22–29)
Calcium: 9.8 mg/dL (ref 8.4–10.4)
Chloride: 107 mEq/L (ref 98–109)
Creatinine: 0.8 mg/dL (ref 0.6–1.1)
Glucose: 103 mg/dl (ref 70–140)
Potassium: 4.1 mEq/L (ref 3.5–5.1)
SODIUM: 142 meq/L (ref 136–145)
TOTAL PROTEIN: 6.8 g/dL (ref 6.4–8.3)

## 2013-11-08 LAB — TECHNOLOGIST REVIEW

## 2013-11-08 LAB — URINALYSIS, MICROSCOPIC - CHCC
BLOOD: NEGATIVE
Bilirubin (Urine): NEGATIVE
Glucose: NEGATIVE mg/dL
Ketones: NEGATIVE mg/dL
Nitrite: NEGATIVE
Protein: <30 mg/dL
SPECIFIC GRAVITY, URINE: 1.025 (ref 1.003–1.035)
UROBILINOGEN UR: 0.2 mg/dL (ref 0.2–1)
pH: 5 (ref 4.6–8.0)

## 2013-11-08 LAB — MAGNESIUM (CC13): Magnesium: 1.9 mg/dl (ref 1.5–2.5)

## 2013-11-08 MED ORDER — DIPHENHYDRAMINE HCL 50 MG/ML IJ SOLN
INTRAMUSCULAR | Status: AC
  Filled 2013-11-08: qty 1

## 2013-11-08 MED ORDER — SODIUM CHLORIDE 0.9 % IV SOLN
80.0000 mg/m2 | Freq: Once | INTRAVENOUS | Status: AC
  Administered 2013-11-08: 156 mg via INTRAVENOUS
  Filled 2013-11-08: qty 26

## 2013-11-08 MED ORDER — DEXAMETHASONE SODIUM PHOSPHATE 20 MG/5ML IJ SOLN
20.0000 mg | Freq: Once | INTRAMUSCULAR | Status: AC
  Administered 2013-11-08: 20 mg via INTRAVENOUS

## 2013-11-08 MED ORDER — FAMOTIDINE IN NACL 20-0.9 MG/50ML-% IV SOLN
20.0000 mg | Freq: Once | INTRAVENOUS | Status: AC
  Administered 2013-11-08: 20 mg via INTRAVENOUS

## 2013-11-08 MED ORDER — SODIUM CHLORIDE 0.9 % IV SOLN
Freq: Once | INTRAVENOUS | Status: AC
  Administered 2013-11-08: 15:00:00 via INTRAVENOUS

## 2013-11-08 MED ORDER — FAMOTIDINE IN NACL 20-0.9 MG/50ML-% IV SOLN
INTRAVENOUS | Status: AC
  Filled 2013-11-08: qty 50

## 2013-11-08 MED ORDER — ONDANSETRON 8 MG/NS 50 ML IVPB
INTRAVENOUS | Status: AC
  Filled 2013-11-08: qty 8

## 2013-11-08 MED ORDER — HEPARIN SOD (PORK) LOCK FLUSH 100 UNIT/ML IV SOLN
500.0000 [IU] | Freq: Once | INTRAVENOUS | Status: AC | PRN
  Administered 2013-11-08: 500 [IU]
  Filled 2013-11-08: qty 5

## 2013-11-08 MED ORDER — ONDANSETRON 8 MG/50ML IVPB (CHCC)
8.0000 mg | Freq: Once | INTRAVENOUS | Status: AC
  Administered 2013-11-08: 8 mg via INTRAVENOUS

## 2013-11-08 MED ORDER — DEXAMETHASONE SODIUM PHOSPHATE 20 MG/5ML IJ SOLN
INTRAMUSCULAR | Status: AC
  Filled 2013-11-08: qty 5

## 2013-11-08 MED ORDER — DIPHENHYDRAMINE HCL 50 MG/ML IJ SOLN
25.0000 mg | Freq: Once | INTRAMUSCULAR | Status: AC
  Administered 2013-11-08: 25 mg via INTRAVENOUS

## 2013-11-08 MED ORDER — SODIUM CHLORIDE 0.9 % IJ SOLN
10.0000 mL | INTRAMUSCULAR | Status: DC | PRN
  Administered 2013-11-08: 10 mL
  Filled 2013-11-08: qty 10

## 2013-11-08 NOTE — Telephone Encounter (Signed)
Per staff message and POF I have scheduled appts.  JMW  

## 2013-11-08 NOTE — Telephone Encounter (Signed)
appts made and printed. Pt is aware to go today and get her xrays. Pt is aware that tx will be added along with gcm appt time slot for 12/06/13. i emailed MW to add the tx. i printed the pof for MD to override time slot for  8:30am on 12/06/13....td

## 2013-11-08 NOTE — Telephone Encounter (Signed)
s.w. pt and advised on 3.17.15 echo appt.Marland KitchenMarland KitchenMarland KitchenMarland Kitchenpt ok adn aware.Marland KitchenMarland Kitchen

## 2013-11-08 NOTE — Progress Notes (Signed)
ID: [name] [name] OB: 14-Jun-1946  MR#: [id number]  PYK#:[id number]  PCP: [name], MD GYN:  M. Edwinna Areola SU: Donnie Mesa, Lowella Bandy,  OTHER MD: Christene Slates, Dian Situ, Brien Few  CHIEF COMPLAINT:  Adjuvant chemotherapy for Right Breast Cancer   BREAST CANCER HISTORY: As per previously documented note:  [name] [name] has a history of breast cancer dating back to 1997, [name] performed a right lumpectomy and axillary lymph node dissection for what according to the patient and her family was a stage I invasive ductal breast cancer. She received radiation adjuvantly and then took tamoxifen for 5 years  In February of 2014 she had a normal screening mammography, but in November 2014 she felt a "hard lump" in her right breast. She brought this to the attention of her urologist, Dr. Izora Gala, and he set her up for bilateral diagnostic mammography with mammography at [facility] 07/13/2013. This showed her breast density to be category B. There was no mammographic abnormality noted but ultrasonography showed a 1.4 cm irregularly shaped solid mass in the right breast at the 8:00 position. This was biopsied the same day, and showed ([id number]) an invasive ductal carcinoma, grade 2, estrogen receptor 100% positive, progesterone receptor 13% positive, with an MIB-1 of 33% and HER-2 amplification with a HER-2: CEP 17 ratio of 3.19, and a HER-2 copy number percent all of 4.15.  The patient's subsequent history is as detailed below  INTERVAL HISTORY: [name] [name] returns today accompanied by her husband Arnette Norris for followup of her right breast cancer. Pattricia continues to receive weekly paclitaxel given along with trastuzumab/pertuzumab. She receives all 3 agents on day 1, and paclitaxel alone on days 8 and 15 of each 21 day cycle. She is due for day 8 cycle 3 today, paclitaxel only. (This is her eighth of 12 planned weekly doses of paclitaxel) I will mention that the pertuzumab  was held last week on day 1 cycle 3 due to continued diarrhea.    Over the past week, [name] [name] has continued to have some loose bowel movements, but these really seem to have improved. She had one episode of diarrhea on Tuesday following treatment. She had 4 loose stools on Friday, and 3 on Sunday (days 4 and 6 of cycle 3). She used Imodium and the stools improved. She has had no diarrhea over the past 2 days. She has no significant abdominal pain. She's noted no blood in the stool. She has not been taking the Questran. Fortunately, she continues to have a good appetite, is eating and drinking well, and denies any problems with nausea or emesis.  [name] has also noticed some increased pain in her left hip and outer pelvic bone. She would rate this as a 6 or 7 on a scale of 1-10. There are areas that seemed to be tender to touch, but she primarily notices the pain with certain movements. The area is stiff and painful after sitting for prolonged period of time. The pain does seem to radiate somewhat towards the left buttock. She does have a long-standing history of chronic pain, degenerative disc disease, and ruptured discs in the lumbar spine. She has also had multiple lumbar epidural injections. She denies any significant weakness in the left lower extremity, and has had no numbness in the left leg. She denies any "saddle numbness" and has had no incontinence of either urine or stool.  Dr Owens Loffler had suggested plain film studies of this region last week, but  these x-rays were never obtained.  There was also mention last week of the fact that [name] [name] has a history of kidney stones diagnosed in October 2014. Dr. Owens Loffler recommended that the patient followup with her urologist, Dr. Janice Norrie, and I think that appointment has been made. I will mention, however, that [name] [name] feels like this pain is different and is more in the hip than the flank. She denies any signs of hematuria or dysuria.   REVIEW OF SYSTEMS: Otherwise,  [name] [name] continues to complain of fatigue. She's had no  rashes or skin changes and denies any signs of abnormal bruising or bleeding.  She's had no mouth ulcers or oral sensitivity. She continues to have have some sinus congestion and a runny nose which is stable.  she's had no fevers or chills. She does feel like her shortness of breath has worsened overall, especially with exertion, but she denies any increased cough, phlegm production, orthopnea, chest pain, or palpitations. (She is due for an echo later this week.) She's had no peripheral swelling. She does note some occasional "prickly" sensations in the soles of her feet, but notes that these come and go and are not affecting any of her day-to-day activities or her ability to walk. She has had no signs of neuropathy in the upper extremities.   In addition to the chronic back pain noted above, she  also has a history of fibromyalgia with associated joint pain and muscle aches, but these appear to be stable. She's had no abnormal headaches, but continues to feel somewhat forgetful at times. She denies any dizziness.  She continues to feel extremely anxious about her diagnosis, and occasionally feels somewhat depressed, but denies suicidal ideation.  A detailed review of systems is otherwise stable and noncontributory.     PAST MEDICAL HISTORY: Past Medical History  Diagnosis Date  . Sinus tachycardia     mild resting  . HLD (hyperlipidemia)   . Orthostatic hypotension     slight in the past  . Anxiety   . Depression   . Diverticulosis   . Fibromyalgia 1978  . Hypothyroidism   . Alopecia   . Ruptured disk     one in neck and two in back  . Plantar fasciitis   . GERD (gastroesophageal reflux disease)   . History of syncope     episode 2008--  no recurrence since  . Ureteral calculi     bilateral  . Leukocytosis   . Chronic back pain     "lower back and upper neck" (07/28/2013)  . 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 ear   . Urgency of urination   .  Hematuria   . Sensation of pressure in bladder area   . Wears glasses   . Ecchymosis     FROM IV AND LAB WORK THE PAST WEEK  06-17-2013  . Stool incontinence     "at times recently"   . Kidney stones 2014  . CAP (community acquired pneumonia)     admission 06-04-2013 secondary to failed oral antibitotics---  last cxr 06-16-2013  much improved  . SOB (shortness of breath) 06-17-2013 CURRENTLY RESIDUAL FROM RECENT CAP    SOB from obesity and deconditioning 2009- eval included CPX  . Arthritis     "spine" (07/28/2013)  . DDD (degenerative disc disease)   . CLL (chronic lymphocytic leukemia) dx'd 05/2013    "I don't think I have it though" (07/28/2013)  . Breast cancer 1997; 2014    right    PAST SURGICAL  HISTORY: Past Surgical History  Procedure Laterality Date  . Lumbar epidural injection      has had 7 injections  . Total abdominal hysterectomy w/ bilateral salpingoophorectomy  1997  . Breast lumpectomy with axillary lymph node dissection Right 1997  . Transthoracic echocardiogram  05-11-2007  dr Ron Parker    normal lvf/  ef 65%  . Tonsillectomy  AGE 2  . Retinal detachment surgery Right 2013  . Cystoscopy with retrograde pyelogram, ureteroscopy and stent placement Bilateral 06/17/2013    Procedure: CYSTOSCOPY WITH RETROGRADE PYELOGRAM, URETEROSCOPY AND LEFT DOUBLE  J STENT PLACEMENT RIGHT URETERAL HOLMIIUM LASER AND DOUBLE J STENT ;  Surgeon: Hanley Ben, MD;  Location: [place];  Service: Urology;  Laterality: Bilateral;  . Holmium laser application Bilateral 20/35/5974    Procedure: HOLMIUM LASER APPLICATION;  Surgeon: Hanley Ben, MD;  Location: [facility];  Service: Urology;  Laterality: Bilateral;  . Mastectomy w/ nodes partial Right 1997  . Mastectomy complete / simple Right 07/28/2013  . Appendectomy  1997  . Breast biopsy Right 2014  . Cystoscopy with ureteroscopy, stone basketry and stent placement Bilateral 2014  . Lithotripsy Left  ~ 06/2013  . Simple mastectomy with axillary sentinel node biopsy Right 07/28/2013    Procedure: RIGHT TOTAL  MASTECTOMY;  Surgeon: Imogene Burn. Georgette Dover, MD;  Location: [place];  Service: General;  Laterality: Right;  . Portacath placement Left 07/28/2013    Procedure: ATTEMPTED INSERTION PORT-A-CATH;  Surgeon: Imogene Burn. Georgette Dover, MD;  Location: [facility];  Service: General;  Laterality: Left;    FAMILY HISTORY Family History  Problem Relation Age of Onset  . Heart disease Maternal Grandfather   . Diabetes Maternal Grandfather   . Colon cancer Maternal Aunt   . Other Paternal Grandmother     brain tumor  . Dementia Mother   . Diabetes Mother   . Osteoporosis Mother   . Diabetes Maternal Aunt   . Prostate cancer Father   . Brain cancer Maternal Grandmother   . Bipolar disorder Maternal Aunt   . Stroke Maternal Aunt    the patient's mother died at the age of 55. The patient's father is alive at age 45. She had no brothers or sisters. Her father has a history of prostate cancer. There is no history of breast or ovarian cancer in the family. One maternal first cousin has a history of Hodgkin's lymphoma.  GYNECOLOGIC HISTORY:  Menarche age 70, first live birth age 64. She is GX P1. She underwent total abdominal hysterectomy and bilateral salpingo-oophorectomy in 1997 she did not take hormone replacement.  SOCIAL HISTORY: (Updated  11/08/2013)  She is a homemaker. Her husband Arnette Norris farms approximately 500 acres. Daughter Colletta [place] lives in [place] where she works as a Pension scheme manager for a drug company. The patient has no grandchildren. She has two "grand cats". She attends a local [employer] church of [place]: Not in place   HEALTH MAINTENANCE:  (Updated   11/08/2013 ) History  Substance Use Topics  . Smoking status: Never Smoker   . Smokeless tobacco: Never Used  . Alcohol Use: Yes     Comment: 07/28/2013 "no alcohol since 1994; never had problem  w/it"     Colonoscopy: 2009/ [facility]  PAP: Status post hysterectomy  Bone density: May 10/14/2009 at [facility] was normal  Lipid panel:  Not on file   Allergies  Allergen Reactions  . Amoxicillin Itching  . Lasix [Furosemide] Nausea And Vomiting  and Other (See Comments)    headache  . Lithium Other (See Comments)    Dizziness, "cause me to fall"  . Sulfa Antibiotics Hives  . Trazodone And Nefazodone Other (See Comments)    insomnia  . Lyrica [Pregabalin] Diarrhea, Nausea Only and Rash    Current Outpatient Prescriptions  Medication Sig Dispense Refill  . ALPRAZolam (XANAX) 0.5 MG tablet TAKE 1 TABLET BY MOUTH EVERY 8 HOURS AS NEEDED  30 tablet  0  . amitriptyline (ELAVIL) 150 MG tablet Take 150 mg by mouth at bedtime.      . Ascorbic Acid (VITAMIN C) 1000 MG tablet Take 1,000 mg by mouth daily.      Marland Kitchen buPROPion (WELLBUTRIN SR) 150 MG 12 hr tablet Take 150-300 mg by mouth 2 (two) times daily. Take $RemoveBef'300mg'LVwwzbCmeg$  in the morning and $RemoveBef'150mg'nRXfqsGbXy$  at 2pm      . cetirizine (ZYRTEC) 10 MG tablet TAKE ONE TABLET BY MOUTH DAILY AS NEEDED  30 tablet  4  . Cholecalciferol (VITAMIN D-3) 1000 UNITS CAPS Take 5,000 Units by mouth daily.      Marland Kitchen ibuprofen (ADVIL,MOTRIN) 200 MG tablet Take 800 mg by mouth every 6 (six) hours as needed for moderate pain.      Marland Kitchen levothyroxine (SYNTHROID, LEVOTHROID) 50 MCG tablet Take 50 mcg by mouth daily before breakfast.      . lidocaine-prilocaine (EMLA) cream Apply 1 application topically as needed.  30 g  3  . metoprolol succinate (TOPROL-XL) 25 MG 24 hr tablet Take 25 mg by mouth daily.      . minoxidil (ROGAINE) 2 % external solution Apply 1 application topically 2 (two) times daily as needed.       . Multiple Vitamins-Minerals (HAIR VITAMINS PO) Take 1 tablet by mouth 2 (two) times daily.      . Omega-3 Fatty Acids (FISH OIL PO) Take 1 capsule by mouth daily.       Marland Kitchen omeprazole (PRILOSEC) 40 MG capsule Take 1 capsule (40 mg total) by mouth every morning.  30 capsule  3  .  Oxcarbazepine (TRILEPTAL) 300 MG tablet Take 300 mg by mouth 2 (two) times daily.      Marland Kitchen oxyCODONE-acetaminophen (PERCOCET/ROXICET) 5-325 MG per tablet Take 1 tablet by mouth every 4 (four) hours as needed for moderate pain.  40 tablet  0  . potassium chloride (MICRO-K) 10 MEQ CR capsule 2 tabs by mouth in the morning and 1 tab by mouth in the evening  90 capsule  3  . Vilazodone HCl (VIIBRYD) 20 MG TABS Take 1 tablet by mouth daily.      . vitamin B-12 (CYANOCOBALAMIN) 1000 MCG tablet Take 1,000 mcg by mouth daily.      . cholestyramine (QUESTRAN) 4 G packet Take 1 packet (4 g total) by mouth 2 (two) times daily as needed (diarrhea). Take with meals.  40 each  1  . ondansetron (ZOFRAN) 8 MG tablet 1 tab by mouth at dinner on day of chemo, then 1 tab with breakfast and dinner on day after chemo.  Then 1 tab every 12 hrs PRN nausea  30 tablet  1  . prochlorperazine (COMPAZINE) 10 MG tablet Take 1 tablet (10 mg total) by mouth 4 (four) times daily -  before meals and at bedtime. Starting at supper day before treatment, then evening of treatment and continue thru next day, then PRN  30 tablet  1   No current facility-administered medications for this visit.    OBJECTIVE: 68 year old  white woman who appears fatigued and anxious Filed Vitals:   11/08/13 1241  BP: 123/79  Pulse: 101  Temp: 99.1 F (37.3 C)  Resp: 18     Body mass index is 30.4 kg/(m^2).    ECOG FS:1 - Symptomatic but completely ambulatory Filed Weights   11/08/13 1241  Weight: 177 lb 3.2 oz (80.377 kg)   Physical Exam: HEENT:  Sclerae anicteric.  Oropharynx clear and moist with no evidence of mucositis or candidiasis.  Neck is supple, trachea midline.  NODES:  No cervical or supraclavicular lymphadenopathy palpated.  BREAST EXAM:  Deferred. Axillae are benign bilaterally, with no palpable lymphadenopathy. LUNGS:  Clear to auscultation bilaterally with good excursion.  no dullness to percussion.  No crackles,  wheezes or  rhonchi HEART:  Regular rate and rhythm.  ABDOMEN:  Soft, nontender. No organomegaly or masses palpated.  Positive and normoactive  bowel sounds.  MSK:  No focal spinal tenderness to palpation. Good range of motion bilaterally in the upper extremities. Able to straight leg raise on the right with no pain or difficulty, approximately 45. Straight leg raise on the left is more limited, less than 45 secondary to pain in the hip, lower back, and hamstrings.  EXTREMITIES:  No peripheral edema.  No lymphedema noted in the upper extremities. SKIN:  No nail dyscrasia noted.  No  new rashes or skin lesions. No pallor. Port is intact in the chest wall with no erythema or edema and no evidence of infection/cellulitis.  NEURO:  Nonfocal. Well oriented.  Anxious affect.       LAB RESULTS:   Lab Results  Component Value Date   WBC 10.4* 11/08/2013   NEUTROABS 4.2 11/08/2013   HGB 11.4* 11/08/2013   HCT 34.9 11/08/2013   MCV 83.9 11/08/2013   PLT 318 11/08/2013      Chemistry      Component Value Date/Time   NA 142 11/08/2013 1227   NA 145 07/30/2013 0422   K 4.1 11/08/2013 1227   K 3.9 07/30/2013 0422   CL 111 07/30/2013 0422   CO2 23 11/08/2013 1227   CO2 25 07/30/2013 0422   BUN 13.1 11/08/2013 1227   BUN 6 07/30/2013 0422   CREATININE 0.8 11/08/2013 1227   CREATININE 0.76 07/30/2013 0422      Component Value Date/Time   CALCIUM 9.8 11/08/2013 1227   CALCIUM 8.3* 07/30/2013 0422   ALKPHOS 83 11/08/2013 1227   ALKPHOS 196* 06/04/2013 2221   AST 18 11/08/2013 1227   AST 30 06/04/2013 2221   ALT 32 11/08/2013 1227   ALT 45* 06/04/2013 2221   BILITOT 0.26 11/08/2013 1227   BILITOT 0.3 06/04/2013 2221      STUDIES:  An echocardiogram on 08/23/2013 showed an ejection fraction of 55-60%.   Scheduled be repeated on 11/09/2013   ASSESSMENT: 68 y.o. [place] woman  (1) status post right lumpectomy and axillary lymph node dissection in 1997 for a stage I breast cancer, treated with adjuvant  radiation and tamoxifen for 5 years  (2) status post right breast biopsy 07/13/2013 for a clinical T1c N0, stage IA invasive ductal carcinoma, grade 2, estrogen receptor 100% positive, progesterone receptor 13% positive, with an MIB-1 of 33%, and HER-2 amplification by CISH with a HER2/CEP 17 ratio of 3.19, and an average HER-2 copy number per cell of 4.15  (3) status post right mastectomy 07/28/2013 for a pT1c pN0, stage IA invasive ductal carcinoma, grade 3, with close but negative margins. Prognostic  panel was not repeated  (4) to receive weekly paclitaxel x12 with trastuzumab/ pertuzumab every 3 weeks x one year starting 09/20/2013.  Pertuzumab was held with third dose on 11/01/2013 due to diarrhea.   OTHER PROBLEMS:  (a) History of chronic lymphoid leukemia diagnosed by flow cytometry 06/29/2013, the cells being CD5, CD20 and CD23 positive, CD10 negative. Her absolute lymphocyte count slightly increased from 5000 to  5700. We'll closely monitor lymphocyte count if needed, we will order the repeat peripheral blood flow cytometry probably in 3 months.  (b) Anemia with a normal MCV and normal ferritin  (c) genetics testing pending  (d) the patient may consider eventual reconstruction   PLAN: The majority of our hour long appointment today was spent reviewing the patient's many questions and concerns, addressing the side effects of this therapy, reviewing her treatment regimen, and coordinating care.  Analyse will proceed with  infusion today for day  8 cycle 3, and will receive paclitaxel alone as scheduled.  We again discussed her regimen to control the diarrhea. I have again recommended that she continue on the Questran, at least once daily to maintain control of the loose stools. If the stools become loose, she should then restart the Imodium, initially 2 tablets with the first loose stool, then 1-2 tablet with each subsequent loose stool. If she has more than 5 stools during a 24-hour  period she should contact our office for consideration of repeat labs and possibly IV fluids. At that point, if the diarrhea is in fact controlled, however, she should increase the Questran to twice daily, using Imodium then only as needed. We will continue to follow her bowels very closely, and will make a decision when she returns in 2 weeks on March 31 whether or not we will reinitiate pertuzumab as planned on day 1 cycle 4.  She was given all of this information in writing today.  I am going to check a urinalysis to evaluate for any hematuria, and Terresa is trying to decide whether or not she wants to see her urologist later this week. I will certainly leave this up to her discretion.  Otherwise, with regards to the left hip pain, I am ordering plain film x-rays of the left hip, left pelvis, and lumbar spine for initial evaluation. If necessary, we may proceed with MRIs for additional evaluation. She does have oxycodone/APAP on hand to take as needed at home  But will certainly let us know if this pain worsens. We will review the results of her studies and reassess the pain when I see her next week on March 24. At that time she will be due for day 15 cycle 3, again paclitaxel alone.  Katha's hypokalemia seems to be well corrected at this point, and she will continue on maintenance oral supplementation, 10 mEq once daily as before. We will continue to follow both her potassium and her magnesium levels on a regular basis due to the diarrhea.  All the above was reviewed with Marcie Bal and her husband Arnette Norris today. Both voice understanding and agreement with our plan, and will call with any changes or problems prior to her appointment here next week.    Durene Fruits   Medical oncology  11/08/2013 5:19 PM

## 2013-11-09 ENCOUNTER — Telehealth: Payer: Self-pay | Admitting: *Deleted

## 2013-11-09 ENCOUNTER — Ambulatory Visit (HOSPITAL_COMMUNITY)
Admission: RE | Admit: 2013-11-09 | Discharge: 2013-11-09 | Disposition: A | Discharge status: Home / Self Care | Payer: Medicare Other | Source: Ambulatory Visit | Attending: Oncology | Admitting: Oncology

## 2013-11-09 DIAGNOSIS — I519 Heart disease, unspecified: Secondary | ICD-10-CM

## 2013-11-09 DIAGNOSIS — Z9010 Acquired absence of unspecified breast and nipple: Secondary | ICD-10-CM | POA: Insufficient documentation

## 2013-11-09 DIAGNOSIS — C50519 Malignant neoplasm of lower-outer quadrant of unspecified female breast: Secondary | ICD-10-CM | POA: Insufficient documentation

## 2013-11-09 DIAGNOSIS — Z09 Encounter for follow-up examination after completed treatment for conditions other than malignant neoplasm: Secondary | ICD-10-CM | POA: Insufficient documentation

## 2013-11-09 DIAGNOSIS — C50511 Malignant neoplasm of lower-outer quadrant of right female breast: Secondary | ICD-10-CM

## 2013-11-09 LAB — VITAMIN D 25 HYDROXY (VIT D DEFICIENCY, FRACTURES): Vit D, 25-Hydroxy: 56 ng/mL (ref 30–89)

## 2013-11-09 LAB — IGG: IgG (Immunoglobin G), Serum: 379 mg/dL — ABNORMAL LOW (ref 690–1700)

## 2013-11-09 NOTE — Telephone Encounter (Signed)
Called [name] [name] with results of tests she had done on yesterday. [name] [name] wants to give back pain some time to heal and if it doesn't go away [name] [name] said she will see Dr. Vira Blanco. Dr has given [name] [name] injections before. [name] [name] has cancelled appt with Dr. Janice Norrie. [name] [name] is no longer having diarrhea. [name] [name] verbalized understanding. No further concerns. Message to be forwarded to [employer], PA-C.

## 2013-11-09 NOTE — Progress Notes (Signed)
  Echocardiogram 2D Echocardiogram has been performed.  Diamond Nickel 11/09/2013, 10:56 AM

## 2013-11-10 ENCOUNTER — Encounter: Payer: Self-pay | Admitting: Genetic Counselor

## 2013-11-10 ENCOUNTER — Ambulatory Visit (HOSPITAL_BASED_OUTPATIENT_CLINIC_OR_DEPARTMENT_OTHER): Payer: Medicare Other | Admitting: Genetic Counselor

## 2013-11-10 ENCOUNTER — Other Ambulatory Visit: Payer: Medicare Other

## 2013-11-10 DIAGNOSIS — Z8042 Family history of malignant neoplasm of prostate: Secondary | ICD-10-CM

## 2013-11-10 DIAGNOSIS — C50919 Malignant neoplasm of unspecified site of unspecified female breast: Secondary | ICD-10-CM

## 2013-11-10 DIAGNOSIS — C50519 Malignant neoplasm of lower-outer quadrant of unspecified female breast: Secondary | ICD-10-CM

## 2013-11-10 DIAGNOSIS — IMO0002 Reserved for concepts with insufficient information to code with codable children: Secondary | ICD-10-CM

## 2013-11-10 DIAGNOSIS — Z800 Family history of malignant neoplasm of digestive organs: Secondary | ICD-10-CM

## 2013-11-10 DIAGNOSIS — Z808 Family history of malignant neoplasm of other organs or systems: Secondary | ICD-10-CM

## 2013-11-10 NOTE — Progress Notes (Signed)
Dr.  Sarajane Jews Magrinat requested a consultation for genetic counseling and risk assessment for [name] [name], a 68 y.o. female, for discussion of her personal history of breast cancer and family history of prostate, brain, colon and stomach cancer.  She presents to clinic today to discuss the possibility of a genetic predisposition to cancer, and to further clarify her risks, as well as her family members' risks for cancer.   HISTORY OF PRESENT ILLNESS: In 1997, at the age of 51, [name] [name] was diagnosed with IDC of the right breast. This was treated with chemotherapy, surgery and radiation and tamoxifen.  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  Surgery is pending genetics.  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   Past Medical History  Diagnosis Date  . Sinus tachycardia     mild resting  . HLD (hyperlipidemia)   . Orthostatic hypotension     slight in the past  . Anxiety   . Depression   . Diverticulosis   . Fibromyalgia 1978  . Hypothyroidism   . Alopecia   . Ruptured disk     one in neck and two in back  . Plantar fasciitis   . GERD (gastroesophageal reflux disease)   . History of syncope     episode 2008--  no recurrence since  . Ureteral calculi     bilateral  . Leukocytosis   . Chronic back pain     "lower back and upper neck" (07/28/2013)  . 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 ear   . Urgency of urination   . Hematuria   . Sensation of pressure in bladder area   . Wears glasses   . Ecchymosis     FROM IV AND LAB WORK THE PAST WEEK  06-17-2013  . Stool incontinence     "at times recently"   . Kidney stones 2014  . CAP (community acquired pneumonia)     admission 06-04-2013 secondary to failed oral antibitotics---  last cxr 06-16-2013  much improved  . SOB (shortness of breath) 06-17-2013 CURRENTLY RESIDUAL FROM RECENT CAP    SOB from obesity and deconditioning 2009- eval included CPX  . Arthritis     "spine" (07/28/2013)   . DDD (degenerative disc disease)   . CLL (chronic lymphocytic leukemia) dx'd 05/2013    "I don't think I have it though" (07/28/2013)  . Breast cancer 1997; 2014    right    Past Surgical History  Procedure Laterality Date  . Lumbar epidural injection      has had 7 injections  . Total abdominal hysterectomy w/ bilateral salpingoophorectomy  1997  . Breast lumpectomy with axillary lymph node dissection Right 1997  . Transthoracic echocardiogram  05-11-2007  dr Ron Parker    normal lvf/  ef 65%  . Tonsillectomy  AGE 6  . Retinal detachment surgery Right 2013  . Cystoscopy with retrograde pyelogram, ureteroscopy and stent placement Bilateral 06/17/2013    Procedure: CYSTOSCOPY WITH RETROGRADE PYELOGRAM, URETEROSCOPY AND LEFT DOUBLE  J STENT PLACEMENT RIGHT URETERAL HOLMIIUM LASER AND DOUBLE J STENT ;  Surgeon: Hanley Ben, MD;  Location: [place];  Service: Urology;  Laterality: Bilateral;  . Holmium laser application Bilateral 26/83/4196    Procedure: HOLMIUM LASER APPLICATION;  Surgeon: Hanley Ben, MD;  Location: [place];  Service: Urology;  Laterality: Bilateral;  . Mastectomy w/ nodes partial Right 1997  . Mastectomy complete / simple  Right 07/28/2013  . Appendectomy  1997  . Breast biopsy Right 2014  . Cystoscopy with ureteroscopy, stone basketry and stent placement Bilateral 2014  . Lithotripsy Left ~ 06/2013  . Simple mastectomy with axillary sentinel node biopsy Right 07/28/2013    Procedure: RIGHT TOTAL  MASTECTOMY;  Surgeon: Imogene Burn. Georgette Dover, MD;  Location: [place];  Service: General;  Laterality: Right;  . Portacath placement Left 07/28/2013    Procedure: ATTEMPTED INSERTION PORT-A-CATH;  Surgeon: Imogene Burn. Georgette Dover, MD;  Location: [place];  Service: General;  Laterality: Left;    History   Social History  . Marital Status: Married    Spouse Name: N/A    Number of Children: 1  . Years of Education: N/A   Occupational History  .  homemaker    Social History Main Topics  . Smoking status: Never Smoker   . Smokeless tobacco: Never Used  . Alcohol Use: Yes     Comment: 07/28/2013 "no alcohol since 1994; never had problem w/it"  . Drug Use: No  . Sexual Activity: No     Comment: TAH/BSO   Other Topics Concern  . None   Social History Narrative  . None    REPRODUCTIVE HISTORY AND PERSONAL RISK ASSESSMENT FACTORS: Menarche was at age 39.   postmenopausal Uterus Intact: no Ovaries Intact: no G1P1A0, first live birth at age 22  She has not previously undergone treatment for infertility.   Oral Contraceptive use: 3 years   She has not used HRT in the past.    FAMILY HISTORY:  We obtained a detailed, 4-generation family history.  Significant diagnoses are listed below: Family History  Problem Relation Age of Onset  . Heart disease Maternal Grandfather   . Diabetes Maternal Grandfather   . Colon cancer Maternal Aunt 79  . Brain cancer Paternal Grandmother     dx in 45s  . Dementia Mother   . Diabetes Mother   . Osteoporosis Mother   . Diabetes Maternal Aunt   . Prostate cancer Father 34  . Bipolar disorder Maternal Aunt   . Stroke Maternal Aunt   . Stomach cancer Paternal Uncle     dx in late 29s    Patient's maternal ancestors are of unknown descent, and paternal ancestors are of [place] descent. There is no reported Ashkenazi Jewish ancestry. There is no known consanguinity.  GENETIC COUNSELING ASSESSMENT: [name] [name] is a 68 y.o. female with a personal history of two breast primaries and family history of prostate, colon, stomach and brain cancer which somewhat suggestive of a hereditary cancer syndrome and predisposition to cancer. We, therefore, discussed and recommended the following at today's visit.   DISCUSSION: We reviewed the characteristics, features and inheritance patterns of hereditary cancer syndromes. We also discussed genetic testing, including the appropriate family members to  test, the process of testing, insurance coverage and turn-around-time for results.   PLAN: After considering the risks, benefits, and limitations, [name] [name] provided informed consent to pursue genetic testing and the blood sample will be sent to [facility] for analysis of the Breast/Ovarian Cancer syndrome. We discussed the implications of a positive, negative and/ or variant of uncertain significance genetic test result. Results should be available within approximately 3 weeks' time, at which point they will be disclosed by telephone to [facility], as will any additional recommendations warranted by these results. [name] [name] will receive a summary of her genetic counseling visit and a copy of her results once  available. This information will also be available in Epic. We encouraged [name] [name] to remain in contact with cancer genetics annually so that we can continuously update the family history and inform her of any changes in cancer genetics and testing that may be of benefit for her family. [name] [name]'s questions were answered to her satisfaction today. Our contact information was provided should additional questions or concerns arise.  The patient was seen for a total of 60 minutes, greater than 50% of which was spent face-to-face counseling.  This note will also be sent to the referring provider via the electronic medical record. The patient will be supplied with a summary of this genetic counseling discussion as well as educational information on the discussed hereditary cancer syndromes following the conclusion of their visit.   Patient was discussed with Dr. Marcy Panning.   _______________________________________________________________________ For Office Staff:  Number of people involved in session: 2 Was an Intern/ student involved with case: no

## 2013-11-15 ENCOUNTER — Ambulatory Visit (HOSPITAL_BASED_OUTPATIENT_CLINIC_OR_DEPARTMENT_OTHER): Payer: Medicare Other

## 2013-11-15 ENCOUNTER — Telehealth: Payer: Self-pay | Admitting: Physician Assistant

## 2013-11-15 ENCOUNTER — Other Ambulatory Visit: Payer: Self-pay | Admitting: Hematology and Oncology

## 2013-11-15 ENCOUNTER — Ambulatory Visit (HOSPITAL_BASED_OUTPATIENT_CLINIC_OR_DEPARTMENT_OTHER): Payer: Medicare Other | Admitting: Physician Assistant

## 2013-11-15 ENCOUNTER — Encounter: Payer: Self-pay | Admitting: Physician Assistant

## 2013-11-15 ENCOUNTER — Other Ambulatory Visit (HOSPITAL_BASED_OUTPATIENT_CLINIC_OR_DEPARTMENT_OTHER): Payer: Medicare Other

## 2013-11-15 VITALS — BP 103/67 | HR 87 | Temp 98.3°F | Resp 18 | Ht 64.0 in | Wt 180.4 lb

## 2013-11-15 DIAGNOSIS — C50519 Malignant neoplasm of lower-outer quadrant of unspecified female breast: Secondary | ICD-10-CM

## 2013-11-15 DIAGNOSIS — C50911 Malignant neoplasm of unspecified site of right female breast: Secondary | ICD-10-CM

## 2013-11-15 DIAGNOSIS — C50511 Malignant neoplasm of lower-outer quadrant of right female breast: Secondary | ICD-10-CM

## 2013-11-15 DIAGNOSIS — C9110 Chronic lymphocytic leukemia of B-cell type not having achieved remission: Secondary | ICD-10-CM

## 2013-11-15 DIAGNOSIS — M797 Fibromyalgia: Secondary | ICD-10-CM

## 2013-11-15 DIAGNOSIS — K219 Gastro-esophageal reflux disease without esophagitis: Secondary | ICD-10-CM

## 2013-11-15 DIAGNOSIS — F419 Anxiety disorder, unspecified: Secondary | ICD-10-CM

## 2013-11-15 DIAGNOSIS — Z856 Personal history of leukemia: Secondary | ICD-10-CM

## 2013-11-15 DIAGNOSIS — Z5111 Encounter for antineoplastic chemotherapy: Secondary | ICD-10-CM

## 2013-11-15 DIAGNOSIS — R197 Diarrhea, unspecified: Secondary | ICD-10-CM

## 2013-11-15 DIAGNOSIS — Z170 Estrogen receptor positive status [ER+]: Secondary | ICD-10-CM

## 2013-11-15 DIAGNOSIS — E876 Hypokalemia: Secondary | ICD-10-CM

## 2013-11-15 DIAGNOSIS — M549 Dorsalgia, unspecified: Secondary | ICD-10-CM

## 2013-11-15 DIAGNOSIS — IMO0002 Reserved for concepts with insufficient information to code with codable children: Secondary | ICD-10-CM

## 2013-11-15 DIAGNOSIS — D649 Anemia, unspecified: Secondary | ICD-10-CM

## 2013-11-15 DIAGNOSIS — G894 Chronic pain syndrome: Secondary | ICD-10-CM

## 2013-11-15 DIAGNOSIS — C50919 Malignant neoplasm of unspecified site of unspecified female breast: Secondary | ICD-10-CM

## 2013-11-15 LAB — COMPREHENSIVE METABOLIC PANEL (CC13)
ALBUMIN: 4 g/dL (ref 3.5–5.0)
ALT: 31 U/L (ref 0–55)
ANION GAP: 11 meq/L (ref 3–11)
AST: 17 U/L (ref 5–34)
Alkaline Phosphatase: 82 U/L (ref 40–150)
BUN: 13.4 mg/dL (ref 7.0–26.0)
CHLORIDE: 110 meq/L — AB (ref 98–109)
CO2: 23 meq/L (ref 22–29)
CREATININE: 0.8 mg/dL (ref 0.6–1.1)
Calcium: 9.7 mg/dL (ref 8.4–10.4)
Glucose: 112 mg/dl (ref 70–140)
POTASSIUM: 3.8 meq/L (ref 3.5–5.1)
Sodium: 144 mEq/L (ref 136–145)
Total Bilirubin: 0.22 mg/dL (ref 0.20–1.20)
Total Protein: 6.5 g/dL (ref 6.4–8.3)

## 2013-11-15 LAB — CBC WITH DIFFERENTIAL/PLATELET
BASO%: 0.8 % (ref 0.0–2.0)
Basophils Absolute: 0.1 10*3/uL (ref 0.0–0.1)
EOS%: 1.8 % (ref 0.0–7.0)
Eosinophils Absolute: 0.2 10*3/uL (ref 0.0–0.5)
HCT: 32.9 % — ABNORMAL LOW (ref 34.8–46.6)
HGB: 10.7 g/dL — ABNORMAL LOW (ref 11.6–15.9)
LYMPH#: 6.9 10*3/uL — AB (ref 0.9–3.3)
LYMPH%: 58.4 % — ABNORMAL HIGH (ref 14.0–49.7)
MCH: 27.6 pg (ref 25.1–34.0)
MCHC: 32.5 g/dL (ref 31.5–36.0)
MCV: 84.8 fL (ref 79.5–101.0)
MONO#: 0.4 10*3/uL (ref 0.1–0.9)
MONO%: 3.6 % (ref 0.0–14.0)
NEUT#: 4.2 10*3/uL (ref 1.5–6.5)
NEUT%: 35.4 % — AB (ref 38.4–76.8)
Platelets: 242 10*3/uL (ref 145–400)
RBC: 3.88 10*6/uL (ref 3.70–5.45)
RDW: 21.2 % — AB (ref 11.2–14.5)
WBC: 11.9 10*3/uL — AB (ref 3.9–10.3)

## 2013-11-15 LAB — TECHNOLOGIST REVIEW

## 2013-11-15 MED ORDER — DIPHENHYDRAMINE HCL 50 MG/ML IJ SOLN
INTRAMUSCULAR | Status: AC
  Filled 2013-11-15: qty 1

## 2013-11-15 MED ORDER — SODIUM CHLORIDE 0.9 % IV SOLN
80.0000 mg/m2 | Freq: Once | INTRAVENOUS | Status: AC
  Administered 2013-11-15: 156 mg via INTRAVENOUS
  Filled 2013-11-15: qty 26

## 2013-11-15 MED ORDER — FAMOTIDINE IN NACL 20-0.9 MG/50ML-% IV SOLN
INTRAVENOUS | Status: AC
  Filled 2013-11-15: qty 50

## 2013-11-15 MED ORDER — DEXAMETHASONE SODIUM PHOSPHATE 20 MG/5ML IJ SOLN
INTRAMUSCULAR | Status: AC
  Filled 2013-11-15: qty 5

## 2013-11-15 MED ORDER — SODIUM CHLORIDE 0.9 % IJ SOLN
10.0000 mL | INTRAMUSCULAR | Status: DC | PRN
  Administered 2013-11-15: 10 mL
  Filled 2013-11-15: qty 10

## 2013-11-15 MED ORDER — ONDANSETRON 8 MG/50ML IVPB (CHCC)
8.0000 mg | Freq: Once | INTRAVENOUS | Status: AC
  Administered 2013-11-15: 8 mg via INTRAVENOUS

## 2013-11-15 MED ORDER — ONDANSETRON 8 MG/NS 50 ML IVPB
INTRAVENOUS | Status: AC
  Filled 2013-11-15: qty 8

## 2013-11-15 MED ORDER — FAMOTIDINE IN NACL 20-0.9 MG/50ML-% IV SOLN
20.0000 mg | Freq: Once | INTRAVENOUS | Status: AC
  Administered 2013-11-15: 20 mg via INTRAVENOUS

## 2013-11-15 MED ORDER — DEXAMETHASONE SODIUM PHOSPHATE 20 MG/5ML IJ SOLN
20.0000 mg | Freq: Once | INTRAMUSCULAR | Status: AC
  Administered 2013-11-15: 20 mg via INTRAVENOUS

## 2013-11-15 MED ORDER — SODIUM CHLORIDE 0.9 % IV SOLN
Freq: Once | INTRAVENOUS | Status: AC
  Administered 2013-11-15: 11:00:00 via INTRAVENOUS

## 2013-11-15 MED ORDER — HEPARIN SOD (PORK) LOCK FLUSH 100 UNIT/ML IV SOLN
500.0000 [IU] | Freq: Once | INTRAVENOUS | Status: AC | PRN
  Administered 2013-11-15: 500 [IU]
  Filled 2013-11-15: qty 5

## 2013-11-15 MED ORDER — DIPHENHYDRAMINE HCL 50 MG/ML IJ SOLN
25.0000 mg | Freq: Once | INTRAMUSCULAR | Status: AC
  Administered 2013-11-15: 25 mg via INTRAVENOUS

## 2013-11-15 NOTE — Progress Notes (Signed)
ID: [name] [name] OB: 1945-12-24  MR#: [id number]  TLX#:[id number]  PCP: [name], MD GYN:  M. Edwinna Areola SU: Donnie Mesa, Lowella Bandy,  OTHER MD: Christene Slates, Dian Situ, Brien Few  CHIEF COMPLAINT:  Adjuvant chemotherapy for Right Breast Cancer   BREAST CANCER HISTORY: As per previously documented note:  Adylynn has a history of breast cancer dating back to 1997, [name] performed a right lumpectomy and axillary lymph node dissection for what according to the patient and her family was a stage I invasive ductal breast cancer. She received radiation adjuvantly and then took tamoxifen for 5 years  In February of 2014 she had a normal screening mammography, but in November 2014 she felt a "hard lump" in her right breast. She brought this to the attention of her urologist, Dr. Izora Gala, and he set her up for bilateral diagnostic mammography with mammography at [facility] 07/13/2013. This showed her breast density to be category B. There was no mammographic abnormality noted but ultrasonography showed a 1.4 cm irregularly shaped solid mass in the right breast at the 8:00 position. This was biopsied the same day, and showed ([id number] an invasive ductal carcinoma, grade 2, estrogen receptor 100% positive, progesterone receptor 13% positive, with an MIB-1 of 33% and HER-2 amplification with a HER-2: CEP 17 ratio of 3.19, and a HER-2 copy number percent all of 4.15.  The patient's subsequent history is as detailed below  INTERVAL HISTORY: Caitland returns today accompanied by her husband Arnette Norris for followup of her right breast cancer. Annielee continues to receive weekly paclitaxel given along with trastuzumab/pertuzumab. She receives all 3 agents on day 1, and paclitaxel alone on days 8 and 15 of each 21 day cycle. She is due for day 15 cycle 3 today, paclitaxel only. (This is her 9th of 12 planned weekly doses of paclitaxel) I will mention that the pertuzumab  was held  on day 1 cycle 3 due to continued diarrhea, which fortunately has now resolved.    Wade had one episode of loose stools this morning, but over the past week has had no problems with diarrhea. She does have some mild abdominal pain when she has diarrhea, but otherwise denies pain. She's been taking the Questran once daily which she has found very helpful. She continues to have some reflux for which she takes omeprazole and occasionally  TUMS.  She's had no significant nausea and denies any emesis.   Gaelyn continues to complain of pain in the lower back, left hip, and left lower extremity. The pain is stable, and she describes this as a "dull ache", averaging a 5 on a scale of 1-10. She takes Percocet occasionally for the pain. She continues to deny any numbness or weakness in the left lower extremity, denies any "saddle numbness"and has had no incontinence of stool or urine.    REVIEW OF SYSTEMS: Otherwise, Alazay  is still very weak and fatigued. She's had no fevers, chills, or night sweats, but does have occasional hot flashes. She's had occasional signs of a rash on her arms, consistent since initiating chemotherapy, and intermittent. It is stable. She's had no additional rashes or skin changes, and she also denies any abnormal bruising or bleeding. She denies any mouth ulcers or oral sensitivity in her appetite is fair. She's had no problems with nausea or emesis. She's had no increased cough and denies any increased shortness of breath, peripheral swelling, chest pain, or palpitations. She's had no  abnormal headaches or dizziness and denies any change in vision. She has some ringing in her ears which is stable and also has a runny nose with some sinus congestion. She continues to have both anxiety and depression but denies suicidal ideation. She denies any increased peripheral neuropathy, only some occasional sensations of tingling in the soles of her feet which comes and goes.   A detailed  review of systems is otherwise stable and noncontributory.     PAST MEDICAL HISTORY: Past Medical History  Diagnosis Date  . Sinus tachycardia     mild resting  . HLD (hyperlipidemia)   . Orthostatic hypotension     slight in the past  . Anxiety   . Depression   . Diverticulosis   . Fibromyalgia 1978  . Hypothyroidism   . Alopecia   . Ruptured disk     one in neck and two in back  . Plantar fasciitis   . GERD (gastroesophageal reflux disease)   . History of syncope     episode 2008--  no recurrence since  . Ureteral calculi     bilateral  . Leukocytosis   . Chronic back pain     "lower back and upper neck" (07/28/2013)  . 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 ear   . Urgency of urination   . Hematuria   . Sensation of pressure in bladder area   . Wears glasses   . Ecchymosis     FROM IV AND LAB WORK THE PAST WEEK  06-17-2013  . Stool incontinence     "at times recently"   . Kidney stones 2014  . CAP (community acquired pneumonia)     admission 06-04-2013 secondary to failed oral antibitotics---  last cxr 06-16-2013  much improved  . SOB (shortness of breath) 06-17-2013 CURRENTLY RESIDUAL FROM RECENT CAP    SOB from obesity and deconditioning 2009- eval included CPX  . Arthritis     "spine" (07/28/2013)  . DDD (degenerative disc disease)   . CLL (chronic lymphocytic leukemia) dx'd 05/2013    "I don't think I have it though" (07/28/2013)  . Breast cancer 1997; 2014    right    PAST SURGICAL HISTORY: Past Surgical History  Procedure Laterality Date  . Lumbar epidural injection      has had 7 injections  . Total abdominal hysterectomy w/ bilateral salpingoophorectomy  1997  . Breast lumpectomy with axillary lymph node dissection Right 1997  . Transthoracic echocardiogram  05-11-2007  dr Ron Parker    normal lvf/  ef 65%  . Tonsillectomy  AGE 47  . Retinal detachment surgery Right 2013  . Cystoscopy with retrograde pyelogram, ureteroscopy and stent placement Bilateral 06/17/2013     Procedure: CYSTOSCOPY WITH RETROGRADE PYELOGRAM, URETEROSCOPY AND LEFT DOUBLE  J STENT PLACEMENT RIGHT URETERAL HOLMIIUM LASER AND DOUBLE J STENT ;  Surgeon: Hanley Ben, MD;  Location: [place];  Service: Urology;  Laterality: Bilateral;  . Holmium laser application Bilateral 21/97/5883    Procedure: HOLMIUM LASER APPLICATION;  Surgeon: Hanley Ben, MD;  Location: [place];  Service: Urology;  Laterality: Bilateral;  . Mastectomy w/ nodes partial Right 1997  . Mastectomy complete / simple Right 07/28/2013  . Appendectomy  1997  . Breast biopsy Right 2014  . Cystoscopy with ureteroscopy, stone basketry and stent placement Bilateral 2014  . Lithotripsy Left ~ 06/2013  . Simple mastectomy with axillary sentinel node biopsy Right 07/28/2013    Procedure: RIGHT TOTAL  MASTECTOMY;  Surgeon: Rodman Key  Oren Section, MD;  Location: [place];  Service: General;  Laterality: Right;  . Portacath placement Left 07/28/2013    Procedure: ATTEMPTED INSERTION PORT-A-CATH;  Surgeon: Imogene Burn. Georgette Dover, MD;  Location: [place] [facility];  Service: General;  Laterality: Left;    FAMILY HISTORY Family History  Problem Relation Age of Onset  . Heart disease Maternal Grandfather   . Diabetes Maternal Grandfather   . Colon cancer Maternal Aunt 79  . Brain cancer Paternal Grandmother     dx in 79s  . Dementia Mother   . Diabetes Mother   . Osteoporosis Mother   . Diabetes Maternal Aunt   . Prostate cancer Father 24  . Bipolar disorder Maternal Aunt   . Stroke Maternal Aunt   . Stomach cancer Paternal Uncle     dx in late 58s   the patient's mother died at the age of 13. The patient's father is alive at age 50. She had no brothers or sisters. Her father has a history of prostate cancer. There is no history of breast or ovarian cancer in the family. One maternal first cousin has a history of Hodgkin's lymphoma.  GYNECOLOGIC HISTORY:  Menarche age 57, first live birth age 57. She is GX P1.  She underwent total abdominal hysterectomy and bilateral salpingo-oophorectomy in 1997 she did not take hormone replacement.  SOCIAL HISTORY: (Updated  11/15/2013)  She is a homemaker. Her husband Arnette Norris farms approximately 500 acres. Daughter Colletta [place] lives in [place] where she works as a Pension scheme manager for a drug company. The patient has no grandchildren. She has two "grand cats". She attends a local united church of [place]: Not in place   HEALTH MAINTENANCE:  (Updated   11/15/2013 ) History  Substance Use Topics  . Smoking status: Never Smoker   . Smokeless tobacco: Never Used  . Alcohol Use: Yes     Comment: 07/28/2013 "no alcohol since 1994; never had problem w/it"     Colonoscopy: 2009/ [facility]  PAP: Status post hysterectomy  Bone density: May 10/14/2009 at [facility] was normal  Lipid panel:  Not on file   Allergies  Allergen Reactions  . Amoxicillin Itching  . Lasix [Furosemide] Nausea And Vomiting and Other (See Comments)    headache  . Lithium Other (See Comments)    Dizziness, "cause me to fall"  . Sulfa Antibiotics Hives  . Trazodone And Nefazodone Other (See Comments)    insomnia  . Lyrica [Pregabalin] Diarrhea, Nausea Only and Rash    Current Outpatient Prescriptions  Medication Sig Dispense Refill  . ALPRAZolam (XANAX) 0.5 MG tablet TAKE 1 TABLET BY MOUTH EVERY 8 HOURS AS NEEDED  30 tablet  0  . amitriptyline (ELAVIL) 150 MG tablet Take 150 mg by mouth at bedtime.      . Ascorbic Acid (VITAMIN C) 1000 MG tablet Take 1,000 mg by mouth daily.      Marland Kitchen buPROPion (WELLBUTRIN SR) 150 MG 12 hr tablet Take 150-300 mg by mouth 2 (two) times daily. Take 344m in the morning and 1564mat 2pm      . cetirizine (ZYRTEC) 10 MG tablet TAKE ONE TABLET BY MOUTH DAILY AS NEEDED  30 tablet  4  . Cholecalciferol (VITAMIN D-3) 1000 UNITS CAPS Take 5,000 Units by mouth daily.      . Marland Kitchenbuprofen (ADVIL,MOTRIN) 200 MG tablet Take 800 mg by  mouth every 6 (six) hours as needed for moderate pain.      . Marland Kitchenevothyroxine (  SYNTHROID, LEVOTHROID) 50 MCG tablet Take 50 mcg by mouth daily before breakfast.      . lidocaine-prilocaine (EMLA) cream Apply 1 application topically as needed.  30 g  3  . metoprolol succinate (TOPROL-XL) 25 MG 24 hr tablet Take 25 mg by mouth daily.      . minoxidil (ROGAINE) 2 % external solution Apply 1 application topically 2 (two) times daily as needed.       . Multiple Vitamins-Minerals (HAIR VITAMINS PO) Take 1 tablet by mouth 2 (two) times daily.      . Omega-3 Fatty Acids (FISH OIL PO) Take 1 capsule by mouth daily.       Marland Kitchen omeprazole (PRILOSEC) 40 MG capsule Take 1 capsule (40 mg total) by mouth every morning.  30 capsule  3  . Oxcarbazepine (TRILEPTAL) 300 MG tablet Take 300 mg by mouth 2 (two) times daily.      Marland Kitchen oxyCODONE-acetaminophen (PERCOCET/ROXICET) 5-325 MG per tablet Take 1 tablet by mouth every 4 (four) hours as needed for moderate pain.  40 tablet  0  . potassium chloride (MICRO-K) 10 MEQ CR capsule 2 tabs by mouth in the morning and 1 tab by mouth in the evening  90 capsule  3  . Vilazodone HCl (VIIBRYD) 20 MG TABS Take 1 tablet by mouth daily.      . vitamin B-12 (CYANOCOBALAMIN) 1000 MCG tablet Take 1,000 mcg by mouth daily.      . cholestyramine (QUESTRAN) 4 G packet Take 1 packet (4 g total) by mouth 2 (two) times daily as needed (diarrhea). Take with meals.  40 each  1  . ondansetron (ZOFRAN) 8 MG tablet 1 tab by mouth at dinner on day of chemo, then 1 tab with breakfast and dinner on day after chemo.  Then 1 tab every 12 hrs PRN nausea  30 tablet  1  . prochlorperazine (COMPAZINE) 10 MG tablet Take 1 tablet (10 mg total) by mouth 4 (four) times daily -  before meals and at bedtime. Starting at supper day before treatment, then evening of treatment and continue thru next day, then PRN  30 tablet  1   No current facility-administered medications for this visit.   Facility-Administered  Medications Ordered in Other Visits  Medication Dose Route Frequency Provider Last Rate Last Dose  . sodium chloride 0.9 % injection 10 mL  10 mL Intracatheter PRN Illene Sweeting Milda Smart, PA-C   10 mL at 11/15/13 1300    OBJECTIVE: 68 year old white woman who appears fatigued and anxious Filed Vitals:   11/15/13 0943  BP: 103/67  Pulse: 87  Temp: 98.3 F (36.8 C)  Resp: 18     Body mass index is 30.95 kg/(m^2).    ECOG FS:1 - Symptomatic but completely ambulatory Filed Weights   11/15/13 0943  Weight: 180 lb 6.4 oz (81.829 kg)   Physical Exam: HEENT:  Sclerae anicteric.  Oropharynx clear and moist with no  ulcerations or pharyngeal candidiasis.  Neck is supple, trachea midline.  NODES:  No cervical or supraclavicular lymphadenopathy palpated.  BREAST EXAM:  Deferred. Axillae are benign bilaterally, with no palpable lymphadenopathy. LUNGS:  Clear to auscultation bilaterally with good excursion.    No crackles,  wheezes or rhonchi HEART:  Regular rate and rhythm.  no murmur appreciated.  ABDOMEN:  Soft, nontender.  no guarding or rebound.No organomegaly or masses palpated.  Positive  bowel sounds.  MSK:  No focal spinal tenderness to palpation. Good range of motion bilaterally in the  upper extremities. Able to straight leg raise on the right with no pain or difficulty, approximately 45. Straight leg raise on the left is  limited, less than 45 secondary to pain in the hip,  and lower back lower back. EXTREMITIES:  No peripheral edema.  No lymphedema noted in the upper extremities. SKIN:  There are some small dry erythematous lesions on the forearms bilaterally, stable compared to previous exams. No additional rashes or skin lesions noted.No nail dyscrasia noted.   No pallor. Port is intact in the chest wall with no erythema or edema and no evidence of infection/cellulitis.  NEURO:  Nonfocal. Well oriented.  Anxious affect.       LAB RESULTS:   Lab Results  Component Value Date   WBC  11.9* 11/15/2013   NEUTROABS 4.2 11/15/2013   HGB 10.7* 11/15/2013   HCT 32.9* 11/15/2013   MCV 84.8 11/15/2013   PLT 242 11/15/2013      Chemistry      Component Value Date/Time   NA 144 11/15/2013 0835   NA 145 07/30/2013 0422   K 3.8 11/15/2013 0835   K 3.9 07/30/2013 0422   CL 111 07/30/2013 0422   CO2 23 11/15/2013 0835   CO2 25 07/30/2013 0422   BUN 13.4 11/15/2013 0835   BUN 6 07/30/2013 0422   CREATININE 0.8 11/15/2013 0835   CREATININE 0.76 07/30/2013 0422      Component Value Date/Time   CALCIUM 9.7 11/15/2013 0835   CALCIUM 8.3* 07/30/2013 0422   ALKPHOS 82 11/15/2013 0835   ALKPHOS 196* 06/04/2013 2221   AST 17 11/15/2013 0835   AST 30 06/04/2013 2221   ALT 31 11/15/2013 0835   ALT 45* 06/04/2013 2221   BILITOT 0.22 11/15/2013 0835   BILITOT 0.3 06/04/2013 2221      STUDIES:  An echocardiogram on 11/09/2013 showed an ejection fraction of 55-60%.    ASSESSMENT: 68 y.o. [place] woman  (1) status post right lumpectomy and axillary lymph node dissection in 1997 for a stage I breast cancer, treated with adjuvant radiation and tamoxifen for 5 years  (2) status post right breast biopsy 07/13/2013 for a clinical T1c N0, stage IA invasive ductal carcinoma, grade 2, estrogen receptor 100% positive, progesterone receptor 13% positive, with an MIB-1 of 33%, and HER-2 amplification by CISH with a HER2/CEP 17 ratio of 3.19, and an average HER-2 copy number per cell of 4.15  (3) status post right mastectomy 07/28/2013 for a pT1c pN0, stage IA invasive ductal carcinoma, grade 3, with close but negative margins. Prognostic panel was not repeated  (4) to receive weekly paclitaxel x12 with trastuzumab/ pertuzumab every 3 weeks x one year starting 09/20/2013.  Pertuzumab was held with third dose on 11/01/2013 due to diarrhea.   OTHER PROBLEMS:  (a) History of chronic lymphoid leukemia diagnosed by flow cytometry 06/29/2013, the cells being CD5, CD20 and CD23 positive, CD10 negative. Her  absolute lymphocyte count slightly increased from 5000 to  5700. We'll closely monitor lymphocyte count if needed, we will order the repeat peripheral blood flow cytometry probably in 3 months.  (b) Anemia with a normal MCV and normal ferritin  (c) genetics testing pending  (d) the patient may consider eventual reconstruction   PLAN:  overall, Aleira is feeling a little better this week. The pain in her lower back and hip appears to be stable, and we will continue to follow this very closely. She will proceed to treatment today as scheduled for day 15  cycle 3, paclitaxel alone.   I will see Eleanna again next week on March 31 prior to day 1 cycle 4. She will definitely be receiving paclitaxel and trastuzumab, but we will reassess whether to rechallenge with pertuzumab at that time. Of course we'll continue to follow her very closely for any signs of peripheral neuropathy, and also for increased diarrhea. In the meanwhile she will continue on the Questran at least once daily, increase to twice daily as needed, and we'll restart the Imodium if needed as well.   Lincoln will continue on her oral potassium supplements and we will continue to follow her potassium and magnesium levels on a regular basis. All of the above was reviewed in detail with Marcie Bal and her husband, both of whom voice understanding and agreement with this plan and they know to call with any changes or problems.    Durene Fruits   Medical oncology  11/15/2013 5:54 PM

## 2013-11-15 NOTE — Telephone Encounter (Signed)
, °

## 2013-11-15 NOTE — Patient Instructions (Signed)
[facility] Discharge Instructions for Patients Receiving Chemotherapy  Today you received the following chemotherapy agents Taxol.  To help prevent nausea and vomiting after your treatment, we encourage you to take your nausea medication as directed.    If you develop nausea and vomiting that is not controlled by your nausea medication, call the clinic.   BELOW ARE SYMPTOMS THAT SHOULD BE REPORTED IMMEDIATELY:  *FEVER GREATER THAN 100.5 F  *CHILLS WITH OR WITHOUT FEVER  NAUSEA AND VOMITING THAT IS NOT CONTROLLED WITH YOUR NAUSEA MEDICATION  *UNUSUAL SHORTNESS OF BREATH  *UNUSUAL BRUISING OR BLEEDING  TENDERNESS IN MOUTH AND THROAT WITH OR WITHOUT PRESENCE OF ULCERS  *URINARY PROBLEMS  *BOWEL PROBLEMS  UNUSUAL RASH Items with * indicate a potential emergency and should be followed up as soon as possible.  Feel free to call the clinic you have any questions or concerns. The clinic phone number is [phone].    

## 2013-11-16 ENCOUNTER — Other Ambulatory Visit: Payer: Self-pay | Admitting: Physician Assistant

## 2013-11-20 ENCOUNTER — Other Ambulatory Visit: Payer: Self-pay | Admitting: Emergency Medicine

## 2013-11-20 ENCOUNTER — Encounter: Payer: Self-pay | Admitting: Oncology

## 2013-11-21 NOTE — Telephone Encounter (Signed)
Faxed

## 2013-11-22 ENCOUNTER — Telehealth: Payer: Self-pay | Admitting: Physician Assistant

## 2013-11-22 ENCOUNTER — Ambulatory Visit (HOSPITAL_BASED_OUTPATIENT_CLINIC_OR_DEPARTMENT_OTHER): Payer: Medicare Other

## 2013-11-22 ENCOUNTER — Other Ambulatory Visit: Payer: Self-pay | Admitting: Oncology

## 2013-11-22 ENCOUNTER — Other Ambulatory Visit (HOSPITAL_BASED_OUTPATIENT_CLINIC_OR_DEPARTMENT_OTHER): Payer: Medicare Other

## 2013-11-22 ENCOUNTER — Ambulatory Visit (HOSPITAL_COMMUNITY)
Admission: RE | Admit: 2013-11-22 | Discharge: 2013-11-22 | Disposition: A | Discharge status: Home / Self Care | Payer: Medicare Other | Source: Ambulatory Visit | Attending: Physician Assistant | Admitting: Physician Assistant

## 2013-11-22 ENCOUNTER — Encounter: Payer: Self-pay | Admitting: Physician Assistant

## 2013-11-22 ENCOUNTER — Telehealth: Payer: Self-pay | Admitting: *Deleted

## 2013-11-22 ENCOUNTER — Ambulatory Visit (HOSPITAL_BASED_OUTPATIENT_CLINIC_OR_DEPARTMENT_OTHER): Payer: Medicare Other | Admitting: Physician Assistant

## 2013-11-22 VITALS — BP 91/55 | HR 90 | Temp 98.7°F | Resp 18 | Ht 64.0 in | Wt 179.6 lb

## 2013-11-22 DIAGNOSIS — G894 Chronic pain syndrome: Secondary | ICD-10-CM

## 2013-11-22 DIAGNOSIS — IMO0002 Reserved for concepts with insufficient information to code with codable children: Secondary | ICD-10-CM

## 2013-11-22 DIAGNOSIS — Z5111 Encounter for antineoplastic chemotherapy: Secondary | ICD-10-CM

## 2013-11-22 DIAGNOSIS — R197 Diarrhea, unspecified: Secondary | ICD-10-CM

## 2013-11-22 DIAGNOSIS — E876 Hypokalemia: Secondary | ICD-10-CM

## 2013-11-22 DIAGNOSIS — D649 Anemia, unspecified: Secondary | ICD-10-CM

## 2013-11-22 DIAGNOSIS — C50511 Malignant neoplasm of lower-outer quadrant of right female breast: Secondary | ICD-10-CM

## 2013-11-22 DIAGNOSIS — C50911 Malignant neoplasm of unspecified site of right female breast: Secondary | ICD-10-CM

## 2013-11-22 DIAGNOSIS — M25559 Pain in unspecified hip: Secondary | ICD-10-CM | POA: Insufficient documentation

## 2013-11-22 DIAGNOSIS — C9110 Chronic lymphocytic leukemia of B-cell type not having achieved remission: Secondary | ICD-10-CM

## 2013-11-22 DIAGNOSIS — Z853 Personal history of malignant neoplasm of breast: Secondary | ICD-10-CM

## 2013-11-22 DIAGNOSIS — G8929 Other chronic pain: Secondary | ICD-10-CM | POA: Insufficient documentation

## 2013-11-22 DIAGNOSIS — M25552 Pain in left hip: Secondary | ICD-10-CM

## 2013-11-22 DIAGNOSIS — C50919 Malignant neoplasm of unspecified site of unspecified female breast: Secondary | ICD-10-CM

## 2013-11-22 DIAGNOSIS — K219 Gastro-esophageal reflux disease without esophagitis: Secondary | ICD-10-CM

## 2013-11-22 DIAGNOSIS — C50519 Malignant neoplasm of lower-outer quadrant of unspecified female breast: Secondary | ICD-10-CM

## 2013-11-22 DIAGNOSIS — F419 Anxiety disorder, unspecified: Secondary | ICD-10-CM

## 2013-11-22 DIAGNOSIS — M47817 Spondylosis without myelopathy or radiculopathy, lumbosacral region: Secondary | ICD-10-CM | POA: Insufficient documentation

## 2013-11-22 DIAGNOSIS — Z5112 Encounter for antineoplastic immunotherapy: Secondary | ICD-10-CM

## 2013-11-22 LAB — CBC WITH DIFFERENTIAL/PLATELET
BASO%: 1.2 % (ref 0.0–2.0)
Basophils Absolute: 0.1 10*3/uL (ref 0.0–0.1)
EOS%: 2.3 % (ref 0.0–7.0)
Eosinophils Absolute: 0.3 10*3/uL (ref 0.0–0.5)
HCT: 30.9 % — ABNORMAL LOW (ref 34.8–46.6)
HGB: 9.9 g/dL — ABNORMAL LOW (ref 11.6–15.9)
LYMPH%: 58.1 % — AB (ref 14.0–49.7)
MCH: 27.7 pg (ref 25.1–34.0)
MCHC: 32 g/dL (ref 31.5–36.0)
MCV: 86.6 fL (ref 79.5–101.0)
MONO#: 0.5 10*3/uL (ref 0.1–0.9)
MONO%: 4.5 % (ref 0.0–14.0)
NEUT#: 3.7 10*3/uL (ref 1.5–6.5)
NEUT%: 33.9 % — ABNORMAL LOW (ref 38.4–76.8)
Platelets: 223 10*3/uL (ref 145–400)
RBC: 3.56 10*6/uL — AB (ref 3.70–5.45)
RDW: 22.3 % — AB (ref 11.2–14.5)
WBC: 11.1 10*3/uL — ABNORMAL HIGH (ref 3.9–10.3)
lymph#: 6.4 10*3/uL — ABNORMAL HIGH (ref 0.9–3.3)

## 2013-11-22 LAB — COMPREHENSIVE METABOLIC PANEL (CC13)
ALBUMIN: 3.9 g/dL (ref 3.5–5.0)
ALK PHOS: 76 U/L (ref 40–150)
ALT: 32 U/L (ref 0–55)
AST: 19 U/L (ref 5–34)
Anion Gap: 10 mEq/L (ref 3–11)
BILIRUBIN TOTAL: 0.2 mg/dL (ref 0.20–1.20)
BUN: 17.2 mg/dL (ref 7.0–26.0)
CO2: 22 mEq/L (ref 22–29)
Calcium: 9.6 mg/dL (ref 8.4–10.4)
Chloride: 113 mEq/L — ABNORMAL HIGH (ref 98–109)
Creatinine: 0.8 mg/dL (ref 0.6–1.1)
Glucose: 107 mg/dl (ref 70–140)
POTASSIUM: 4.4 meq/L (ref 3.5–5.1)
SODIUM: 145 meq/L (ref 136–145)
TOTAL PROTEIN: 6.2 g/dL — AB (ref 6.4–8.3)

## 2013-11-22 LAB — TECHNOLOGIST REVIEW

## 2013-11-22 MED ORDER — SODIUM CHLORIDE 0.9 % IV SOLN
210.0000 mg | Freq: Once | INTRAVENOUS | Status: AC
  Administered 2013-11-22: 210 mg via INTRAVENOUS
  Filled 2013-11-22: qty 7

## 2013-11-22 MED ORDER — ACETAMINOPHEN 325 MG PO TABS
ORAL_TABLET | ORAL | Status: AC
  Filled 2013-11-22: qty 2

## 2013-11-22 MED ORDER — TRASTUZUMAB CHEMO INJECTION 440 MG
6.0000 mg/kg | Freq: Once | INTRAVENOUS | Status: AC
  Administered 2013-11-22: 483 mg via INTRAVENOUS
  Filled 2013-11-22: qty 23

## 2013-11-22 MED ORDER — DEXAMETHASONE SODIUM PHOSPHATE 20 MG/5ML IJ SOLN
20.0000 mg | Freq: Once | INTRAMUSCULAR | Status: AC
  Administered 2013-11-22: 20 mg via INTRAVENOUS

## 2013-11-22 MED ORDER — FAMOTIDINE IN NACL 20-0.9 MG/50ML-% IV SOLN
INTRAVENOUS | Status: AC
  Filled 2013-11-22: qty 50

## 2013-11-22 MED ORDER — SODIUM CHLORIDE 0.9 % IV SOLN
80.0000 mg/m2 | Freq: Once | INTRAVENOUS | Status: AC
  Administered 2013-11-22: 156 mg via INTRAVENOUS
  Filled 2013-11-22: qty 26

## 2013-11-22 MED ORDER — SODIUM CHLORIDE 0.9 % IV SOLN
Freq: Once | INTRAVENOUS | Status: AC
  Administered 2013-11-22: 12:00:00 via INTRAVENOUS

## 2013-11-22 MED ORDER — ONDANSETRON 8 MG/NS 50 ML IVPB
INTRAVENOUS | Status: AC
  Filled 2013-11-22: qty 8

## 2013-11-22 MED ORDER — ACETAMINOPHEN 325 MG PO TABS
650.0000 mg | ORAL_TABLET | Freq: Once | ORAL | Status: AC
  Administered 2013-11-22: 650 mg via ORAL

## 2013-11-22 MED ORDER — ONDANSETRON 8 MG/50ML IVPB (CHCC)
8.0000 mg | Freq: Once | INTRAVENOUS | Status: AC
  Administered 2013-11-22: 8 mg via INTRAVENOUS

## 2013-11-22 MED ORDER — DIPHENHYDRAMINE HCL 50 MG/ML IJ SOLN
25.0000 mg | Freq: Once | INTRAMUSCULAR | Status: AC
  Administered 2013-11-22: 25 mg via INTRAVENOUS

## 2013-11-22 MED ORDER — FAMOTIDINE IN NACL 20-0.9 MG/50ML-% IV SOLN
20.0000 mg | Freq: Once | INTRAVENOUS | Status: AC
  Administered 2013-11-22: 20 mg via INTRAVENOUS

## 2013-11-22 MED ORDER — DEXAMETHASONE SODIUM PHOSPHATE 20 MG/5ML IJ SOLN
INTRAMUSCULAR | Status: AC
  Filled 2013-11-22: qty 5

## 2013-11-22 MED ORDER — HEPARIN SOD (PORK) LOCK FLUSH 100 UNIT/ML IV SOLN
500.0000 [IU] | Freq: Once | INTRAVENOUS | Status: AC | PRN
  Administered 2013-11-22: 500 [IU]
  Filled 2013-11-22: qty 5

## 2013-11-22 MED ORDER — SODIUM CHLORIDE 0.9 % IJ SOLN
10.0000 mL | INTRAMUSCULAR | Status: DC | PRN
  Administered 2013-11-22: 10 mL
  Filled 2013-11-22: qty 10

## 2013-11-22 MED ORDER — DIPHENHYDRAMINE HCL 50 MG/ML IJ SOLN
INTRAMUSCULAR | Status: AC
  Filled 2013-11-22: qty 1

## 2013-11-22 NOTE — Telephone Encounter (Signed)
, °

## 2013-11-22 NOTE — Progress Notes (Signed)
ID: [name] [name] OB: [birth year]  MR#: [id number]  FXT#:[id number]  PCP: [name], MD GYN:  M. Edwinna Areola SU: Donnie Mesa, Lowella Bandy,  OTHER MD: Christene Slates, Dian Situ, Brien Few  CHIEF COMPLAINT:  Adjuvant chemotherapy for Right Breast Cancer   BREAST CANCER HISTORY: As per previously documented note:  [name] [name] has a history of breast cancer dating back to 1997, [name] performed a right lumpectomy and axillary lymph node dissection for what according to the patient and her family was a stage I invasive ductal breast cancer. She received radiation adjuvantly and then took tamoxifen for 5 years  In February of 2014 she had a normal screening mammography, but in November 2014 she felt a "hard lump" in her right breast. She brought this to the attention of her urologist, Dr. Izora Gala, and he set her up for bilateral diagnostic mammography with mammography at [facility] 07/13/2013. This showed her breast density to be category B. There was no mammographic abnormality noted but ultrasonography showed a 1.4 cm irregularly shaped solid mass in the right breast at the 8:00 position. This was biopsied the same day, and showed ([id number]) an invasive ductal carcinoma, grade 2, estrogen receptor 100% positive, progesterone receptor 13% positive, with an MIB-1 of 33% and HER-2 amplification with a HER-2: CEP 17 ratio of 3.19, and a HER-2 copy number percent all of 4.15.  The patient's subsequent history is as detailed below  INTERVAL HISTORY: Jun returns alone today for followup of her recurrent right breast cancer. [name] [name] continues to receive weekly paclitaxel given along with trastuzumab/pertuzumab. She receives all 3 agents on day 1, and paclitaxel alone on days 8 and 15 of each 21 day cycle. Today is day 1 cycle 4 of 4 planned cycles, and she is due for all 3 agents today. (This is her 10th of 12 planned weekly doses of paclitaxel)  I will mention that the  pertuzumab was held 3 weeks ago on day 1 cycle 3 due to continued diarrhea, which has now resolved.    [name] [name] having approximately 3 episodes of diarrhea on one day over the past week. She took a dose of Imodium and this took care of the problem completely. She continues to take Questran once daily. She's had no additional diarrhea and no additional change in bowel habits. She's keeping herself well hydrated, and her appetite is fairly good. She has had no nausea or emesis. She continues to have some reflux for which she takes omeprazole appropriately.  [name] [name] continues to have chronic back pain and hip pain, although she feels like it has improved slightly over the past several days. This has been evaluated thoroughly with x-rays, none of which showed evidence of disease progression or metastasis, but they did confirm degenerative disc disease as previously diagnosed.  REVIEW OF SYSTEMS: Otherwise, [name] [name] still feels weak and tired, and continues to have some difficulty sleeping. She's had no rashes or skin changes. She denies any abnormal bruising or bleeding. She has a runny nose and occasional dry cough but denies any phlegm production, pleurisy, or orthopnea. She does have shortness of breath with exertion but this is stable. She's had no chest pain or pressure, but occasionally feels some palpitations. [name] [name] denies any abnormal headaches or dizziness. She denies any peripheral swelling, and denies any current problems with peripheral neuropathy in either the upper or lower extremities. She continues to have problems with anxiety and depression, but denies suicidal ideation.  A  detailed review of systems is otherwise stable and noncontributory.     PAST MEDICAL HISTORY: Past Medical History  Diagnosis Date  . Sinus tachycardia     mild resting  . HLD (hyperlipidemia)   . Orthostatic hypotension     slight in the past  . Anxiety   . Depression   . Diverticulosis   . Fibromyalgia 1978  .  Hypothyroidism   . Alopecia   . Ruptured disk     one in neck and two in back  . Plantar fasciitis   . GERD (gastroesophageal reflux disease)   . History of syncope     episode 2008--  no recurrence since  . Ureteral calculi     bilateral  . Leukocytosis   . Chronic back pain     "lower back and upper neck" (07/28/2013)  . 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 ear   . Urgency of urination   . Hematuria   . Sensation of pressure in bladder area   . Wears glasses   . Ecchymosis     FROM IV AND LAB WORK THE PAST WEEK  06-17-2013  . Stool incontinence     "at times recently"   . Kidney stones 2014  . CAP (community acquired pneumonia)     admission 06-04-2013 secondary to failed oral antibitotics---  last cxr 06-16-2013  much improved  . SOB (shortness of breath) 06-17-2013 CURRENTLY RESIDUAL FROM RECENT CAP    SOB from obesity and deconditioning 2009- eval included CPX  . Arthritis     "spine" (07/28/2013)  . DDD (degenerative disc disease)   . CLL (chronic lymphocytic leukemia) dx'd 05/2013    "I don't think I have it though" (07/28/2013)  . Breast cancer 1997; 2014    right    PAST SURGICAL HISTORY: Past Surgical History  Procedure Laterality Date  . Lumbar epidural injection      has had 7 injections  . Total abdominal hysterectomy w/ bilateral salpingoophorectomy  1997  . Breast lumpectomy with axillary lymph node dissection Right 1997  . Transthoracic echocardiogram  05-11-2007  dr Ron Parker    normal lvf/  ef 65%  . Tonsillectomy  AGE 68  . Retinal detachment surgery Right 2013  . Cystoscopy with retrograde pyelogram, ureteroscopy and stent placement Bilateral 06/17/2013    Procedure: CYSTOSCOPY WITH RETROGRADE PYELOGRAM, URETEROSCOPY AND LEFT DOUBLE  J STENT PLACEMENT RIGHT URETERAL HOLMIIUM LASER AND DOUBLE J STENT ;  Surgeon: Hanley Ben, MD;  Location: [facility];  Service: Urology;  Laterality: Bilateral;  . Holmium laser application Bilateral 33/82/5053     Procedure: HOLMIUM LASER APPLICATION;  Surgeon: Hanley Ben, MD;  Location: [facility];  Service: Urology;  Laterality: Bilateral;  . Mastectomy w/ nodes partial Right 1997  . Mastectomy complete / simple Right 07/28/2013  . Appendectomy  1997  . Breast biopsy Right 2014  . Cystoscopy with ureteroscopy, stone basketry and stent placement Bilateral 2014  . Lithotripsy Left ~ 06/2013  . Simple mastectomy with axillary sentinel node biopsy Right 07/28/2013    Procedure: RIGHT TOTAL  MASTECTOMY;  Surgeon: Imogene Burn. Georgette Dover, MD;  Location: [facility];  Service: General;  Laterality: Right;  . Portacath placement Left 07/28/2013    Procedure: ATTEMPTED INSERTION PORT-A-CATH;  Surgeon: Imogene Burn. Georgette Dover, MD;  Location: [facility];  Service: General;  Laterality: Left;    FAMILY HISTORY Family History  Problem Relation Age of Onset  . Heart disease Maternal Grandfather   . Diabetes Maternal Grandfather   .  Colon cancer Maternal Aunt 79  . Brain cancer Paternal Grandmother     dx in 24s  . Dementia Mother   . Diabetes Mother   . Osteoporosis Mother   . Diabetes Maternal Aunt   . Prostate cancer Father 52  . Bipolar disorder Maternal Aunt   . Stroke Maternal Aunt   . Stomach cancer Paternal Uncle     dx in late 68s   the patient's mother died at the age of 33. The patient's father is alive at age 61. She had no brothers or sisters. Her father has a history of prostate cancer. There is no history of breast or ovarian cancer in the family. One maternal first cousin has a history of Hodgkin's lymphoma.  GYNECOLOGIC HISTORY:  Menarche age 14, first live birth age 3. She is GX P1. She underwent total abdominal hysterectomy and bilateral salpingo-oophorectomy in 1997 she did not take hormone replacement.  SOCIAL HISTORY: (Updated  11/15/2013)  She is a homemaker. Her husband Arnette Norris farms approximately 500 acres. Daughter Colletta [place] lives in [place] where she works as a  Pension scheme manager for a drug company. The patient has no grandchildren. She has two "grand cats". She attends a local united church of [place]: Not in place   HEALTH MAINTENANCE:  (Updated   11/15/2013 ) History  Substance Use Topics  . Smoking status: Never Smoker   . Smokeless tobacco: Never Used  . 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 Amoxicillin Itching  . Lasix [Furosemide] Nausea And Vomiting and Other (See Comments)    headache  . Lithium Other (See Comments)    Dizziness, "cause me to fall"  . Sulfa Antibiotics Hives  . Trazodone And Nefazodone Other (See Comments)    insomnia  . Lyrica [Pregabalin] Diarrhea, Nausea Only and Rash    Current Outpatient Prescriptions  Medication Sig Dispense Refill  . ALPRAZolam (XANAX) 0.5 MG tablet TAKE 1 TABLET EVERY 8 HOURS AS NEEDED  30 tablet  0  . amitriptyline (ELAVIL) 150 MG tablet Take 150 mg by mouth at bedtime.      . Ascorbic Acid (VITAMIN C) 1000 MG tablet Take 1,000 mg by mouth daily.      Marland Kitchen buPROPion (WELLBUTRIN SR) 150 MG 12 hr tablet Take 150-300 mg by mouth 2 (two) times daily. Take 383m in the morning and 1535mat 2pm      . cetirizine (ZYRTEC) 10 MG tablet TAKE ONE TABLET BY MOUTH DAILY AS NEEDED  30 tablet  4  . Cholecalciferol (VITAMIN D-3) 1000 UNITS CAPS Take 5,000 Units by mouth daily.      . cholestyramine (QUESTRAN) 4 G packet Take 1 packet (4 g total) by mouth 2 (two) times daily as needed (diarrhea). Take with meals.  40 each  1  . ibuprofen (ADVIL,MOTRIN) 200 MG tablet Take 800 mg by mouth every 6 (six) hours as needed for moderate pain.      . Marland Kitchenevothyroxine (SYNTHROID, LEVOTHROID) 50 MCG tablet Take 50 mcg by mouth daily before breakfast.      .  lidocaine-prilocaine (EMLA) cream Apply 1 application topically as needed.  30 g  3  . metoprolol succinate (TOPROL-XL) 25 MG 24 hr  tablet Take 25 mg by mouth daily.      . minoxidil (ROGAINE) 2 % external solution Apply 1 application topically 2 (two) times daily as needed.       . Multiple Vitamins-Minerals (HAIR VITAMINS PO) Take 1 tablet by mouth 2 (two) times daily.      . Omega-3 Fatty Acids (FISH OIL PO) Take 1 capsule by mouth daily.       Marland Kitchen omeprazole (PRILOSEC) 40 MG capsule Take 1 capsule (40 mg total) by mouth every morning.  30 capsule  3  . Oxcarbazepine (TRILEPTAL) 300 MG tablet Take 300 mg by mouth 2 (two) times daily.      Marland Kitchen oxyCODONE-acetaminophen (PERCOCET/ROXICET) 5-325 MG per tablet Take 1 tablet by mouth every 4 (four) hours as needed for moderate pain.  40 tablet  0  . potassium chloride (MICRO-K) 10 MEQ CR capsule 2 tabs by mouth in the morning and 1 tab by mouth in the evening  90 capsule  3  . Vilazodone HCl (VIIBRYD) 20 MG TABS Take 1 tablet by mouth daily.      . vitamin B-12 (CYANOCOBALAMIN) 1000 MCG tablet Take 1,000 mcg by mouth daily.      . ondansetron (ZOFRAN) 8 MG tablet 1 tab by mouth at dinner on day of chemo, then 1 tab with breakfast and dinner on day after chemo.  Then 1 tab every 12 hrs PRN nausea  30 tablet  1  . prochlorperazine (COMPAZINE) 10 MG tablet Take 1 tablet (10 mg total) by mouth 4 (four) times daily -  before meals and at bedtime. Starting at supper day before treatment, then evening of treatment and continue thru next day, then PRN  30 tablet  1   No current facility-administered medications for this visit.   Facility-Administered Medications Ordered in Other Visits  Medication Dose Route Frequency Provider Last Rate Last Dose  . sodium chloride 0.9 % injection 10 mL  10 mL Intracatheter PRN Theotis Burrow, PA-C   10 mL at 11/22/13 1504    OBJECTIVE: 68 year old white woman who appears tired and anxious Filed Vitals:   11/22/13 1017  BP:  91/55  Pulse: 90  Temp: 98.7 F (37.1 C)  Resp: 18     Body mass index is 30.81 kg/(m^2).    ECOG FS:1 - Symptomatic but completely ambulatory Filed Weights   11/22/13 1017  Weight: 179 lb 9.6 oz (81.466 kg)   Physical Exam: HEENT:  Sclerae anicteric.  Oropharynx clear and moist. No  evidence of mucositis. No oropharyngeal candidiasis.  Neck is supple, trachea midline.  NODES:  No cervical or supraclavicular lymphadenopathy palpated.  BREAST EXAM:  Deferred. Axillae are benign bilaterally, with no palpable lymphadenopathy. LUNGS:  Clear to auscultation bilaterally with good excursion.    No crackles,  wheezes or rhonchi. No dullness to percussion. HEART:  Regular rate and rhythm. No murmur appreciated.  ABDOMEN:  Soft, nontender.  No organomegaly or masses palpated.  Positive, normoactive bowel sounds.  MSK:  No focal spinal tenderness to palpation. Good range of motion bilaterally in the upper extremities.  EXTREMITIES:  No peripheral edema.  No lymphedema noted in the upper extremities. SKIN:  No new rashes or skin lesions noted. No excessive ecchymoses. No petechiae. No pallor.  Port is intact in the chest wall with no erythema or edema and no evidence of infection/cellulitis.  NEURO:  Nonfocal. Well oriented.  Anxious affect.       LAB RESULTS:   Lab Results  Component Value Date   WBC 11.1* 11/22/2013   NEUTROABS 3.7 11/22/2013   HGB 9.9* 11/22/2013   HCT 30.9* 11/22/2013   MCV 86.6 11/22/2013   PLT 223 11/22/2013      Chemistry      Component Value Date/Time   NA 145 11/22/2013 0923   NA 145 07/30/2013 0422   K 4.4 11/22/2013 0923   K 3.9 07/30/2013 0422   CL 111 07/30/2013 0422   CO2 22 11/22/2013 0923   CO2 25 07/30/2013 0422   BUN 17.2 11/22/2013 0923   BUN 6 07/30/2013 0422   CREATININE 0.8 11/22/2013 0923   CREATININE 0.76 07/30/2013 0422      Component Value Date/Time   CALCIUM 9.6 11/22/2013 0923   CALCIUM 8.3* 07/30/2013 0422   ALKPHOS 76 11/22/2013 0923    ALKPHOS 196* 06/04/2013 2221   AST 19 11/22/2013 0923   AST 30 06/04/2013 2221   ALT 32 11/22/2013 0923   ALT 45* 06/04/2013 2221   BILITOT 0.20 11/22/2013 0923   BILITOT 0.3 06/04/2013 2221      STUDIES:  An echocardiogram on 11/09/2013 showed an ejection fraction of 55-60%.    ASSESSMENT: 68 y.o. [place] woman  (1) status post right lumpectomy and axillary lymph node dissection in 1997 for a stage I breast cancer, treated with adjuvant radiation and tamoxifen for 5 years  (2) status post right breast biopsy 07/13/2013 for a clinical T1c N0, stage IA invasive ductal carcinoma, grade 2, estrogen receptor 100% positive, progesterone receptor 13% positive, with an MIB-1 of 33%, and HER-2 amplification by CISH with a HER2/CEP 17 ratio of 3.19, and an average HER-2 copy number per cell of 4.15  (3) status post right mastectomy 07/28/2013 for a pT1c pN0, stage IA invasive ductal carcinoma, grade 3, with close but negative margins. Prognostic panel was not repeated  (4) to receive weekly paclitaxel x12 with trastuzumab/ pertuzumab every 3 weeks x one year starting 09/20/2013.  Pertuzumab was held with third dose on 11/01/2013 due to diarrhea.   OTHER PROBLEMS:  (a) History of chronic lymphoid leukemia diagnosed by flow cytometry 06/29/2013, the cells being CD5, CD20 and CD23 positive, CD10 negative. Her absolute lymphocyte count slightly increased from 5000 to  5700. We'll closely monitor lymphocyte count if needed, we will order the repeat peripheral blood flow cytometry probably in 3 months.  (b) Anemia with a normal MCV and normal ferritin  (c) genetics testing pending  (d) the patient may consider eventual reconstruction   PLAN: Alylah will proceed to treatment today as scheduled for day 1 cycle 4 of paclitaxel given with trastuzumab/pertuzumab.  After much discussion, she feels like she is ready to rechallenge herself with the pertuzumab, but per Dr. Virgie Dad suggestion, this  will be given at a 50% reduced dose, 210 mg today. She'll continue on Questran a minimun of once daily, and in fact I have suggested that she increase this to twice daily over the next week or so after receiving the pertuzumab today. She will also be aggressive with her use of Imodium as needed, but will contact us if she is not able to control the diarrhea.  We will continue to follow both her potassium and magnesium levels regularly.  Nelissa will see her surgeon, Dr. Georgette Dover later this week for routine followup. She'll receive her day 8 cycle 4 dose of paclitaxel next week on April 7, and will return to see Dr. Jana Hakim 2 weeks from now on April 14 prior to  her final dose of paclitaxel.   On April 21, she'll be ready to initiate the next regimen, which will consist of trastuzumab/pertuzumab given every 3 weeks to complete one year. Hopefully, if she tolerates the 210 mg dose of pertuzumab today, we will be able to increase back to the suggested dose of 420 mg with that cycle.  All of the above was reviewed in detail with Marcie Bal today and she voices her understanding and agreement with this plan.  As always, she knows to call with any changes or problems prior to her next scheduled visit.    Durene Fruits   Medical oncology  11/22/2013 5:20 PM

## 2013-11-22 NOTE — Telephone Encounter (Signed)
Per staff message and POF I have scheduled appts.  JMW  

## 2013-11-22 NOTE — Patient Instructions (Signed)
[facility] Discharge Instructions for Patients Receiving Chemotherapy  Today you received the following chemotherapy agents :  Herceptin,  Perjeta,  Taxol. To help prevent nausea and vomiting after your treatment, we encourage you to take your nausea medication as prescribed.   If you develop nausea and vomiting that is not controlled by your nausea medication, call the clinic.   BELOW ARE SYMPTOMS THAT SHOULD BE REPORTED IMMEDIATELY:  *FEVER GREATER THAN 100.5 F  *CHILLS WITH OR WITHOUT FEVER  NAUSEA AND VOMITING THAT IS NOT CONTROLLED WITH YOUR NAUSEA MEDICATION  *UNUSUAL SHORTNESS OF BREATH  *UNUSUAL BRUISING OR BLEEDING  TENDERNESS IN MOUTH AND THROAT WITH OR WITHOUT PRESENCE OF ULCERS  *URINARY PROBLEMS  *BOWEL PROBLEMS  UNUSUAL RASH Items with * indicate a potential emergency and should be followed up as soon as possible.  Feel free to call the clinic you have any questions or concerns. The clinic phone number is [phone].    

## 2013-11-23 ENCOUNTER — Encounter: Payer: Self-pay | Admitting: Oncology

## 2013-11-24 ENCOUNTER — Encounter (INDEPENDENT_AMBULATORY_CARE_PROVIDER_SITE_OTHER): Payer: Self-pay | Admitting: Surgery

## 2013-11-24 ENCOUNTER — Ambulatory Visit (INDEPENDENT_AMBULATORY_CARE_PROVIDER_SITE_OTHER): Payer: Medicare Other | Admitting: Surgery

## 2013-11-24 VITALS — BP 124/80 | HR 75 | Temp 97.8°F | Resp 14 | Ht 64.0 in | Wt 182.0 lb

## 2013-11-24 DIAGNOSIS — C50919 Malignant neoplasm of unspecified site of unspecified female breast: Secondary | ICD-10-CM

## 2013-11-24 NOTE — Progress Notes (Signed)
S/p right mastectomy on 07/28/13 for recurrent right breast cancer.  The patient is currently undergoing chemotherapy and seems to be tolerating it reasonably well.  She comes in today for a 16-monthcheckup.  She has no problems with her mastectomy site.  She met with Dr. DCrissie Reeseto discuss reconstruction, but she is quite apprehensive about considering a latissimus flap.  She has not yet been fitted for a prosthetic.  Filed Vitals:   11/24/13 1037  BP: 124/80  Pulse: 75  Temp: 97.8 F (36.6 C)  Resp: 14   Her right mastectomy site is healing well with no new masses.  No axillary lymphadenopathy.  No left breast masses or adenopathy.  She may be fitted for a prosthetic at her convenience.  She seems to be tolerating her chemotherapy.  Recheck in 6 months.  MImogene Burn TGeorgette Dover MD, FPark Eye And SurgicenterSurgery  General/ Trauma Surgery  11/24/2013 1:40 PM

## 2013-11-25 ENCOUNTER — Telehealth: Payer: Self-pay | Admitting: Genetic Counselor

## 2013-11-25 ENCOUNTER — Encounter: Payer: Self-pay | Admitting: Genetic Counselor

## 2013-11-25 NOTE — Telephone Encounter (Signed)
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

## 2013-11-28 ENCOUNTER — Other Ambulatory Visit: Payer: Self-pay | Admitting: Oncology

## 2013-11-28 NOTE — Progress Notes (Unsigned)
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  They are listed below.  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

## 2013-11-29 ENCOUNTER — Other Ambulatory Visit: Payer: Self-pay

## 2013-11-29 ENCOUNTER — Ambulatory Visit (HOSPITAL_BASED_OUTPATIENT_CLINIC_OR_DEPARTMENT_OTHER): Payer: Medicare Other

## 2013-11-29 ENCOUNTER — Other Ambulatory Visit (HOSPITAL_BASED_OUTPATIENT_CLINIC_OR_DEPARTMENT_OTHER): Payer: Medicare Other

## 2013-11-29 VITALS — BP 121/68 | HR 90 | Temp 98.8°F

## 2013-11-29 DIAGNOSIS — R197 Diarrhea, unspecified: Secondary | ICD-10-CM

## 2013-11-29 DIAGNOSIS — C50511 Malignant neoplasm of lower-outer quadrant of right female breast: Secondary | ICD-10-CM

## 2013-11-29 DIAGNOSIS — E876 Hypokalemia: Secondary | ICD-10-CM

## 2013-11-29 DIAGNOSIS — C50919 Malignant neoplasm of unspecified site of unspecified female breast: Secondary | ICD-10-CM

## 2013-11-29 DIAGNOSIS — Z5111 Encounter for antineoplastic chemotherapy: Secondary | ICD-10-CM

## 2013-11-29 DIAGNOSIS — C50519 Malignant neoplasm of lower-outer quadrant of unspecified female breast: Secondary | ICD-10-CM

## 2013-11-29 DIAGNOSIS — C50911 Malignant neoplasm of unspecified site of right female breast: Secondary | ICD-10-CM

## 2013-11-29 LAB — CBC WITH DIFFERENTIAL/PLATELET
BASO%: 1.1 % (ref 0.0–2.0)
BASOS ABS: 0.1 10*3/uL (ref 0.0–0.1)
EOS%: 2.1 % (ref 0.0–7.0)
Eosinophils Absolute: 0.2 10*3/uL (ref 0.0–0.5)
HCT: 32.7 % — ABNORMAL LOW (ref 34.8–46.6)
HEMOGLOBIN: 10.8 g/dL — AB (ref 11.6–15.9)
LYMPH%: 60.3 % — AB (ref 14.0–49.7)
MCH: 28.7 pg (ref 25.1–34.0)
MCHC: 33 g/dL (ref 31.5–36.0)
MCV: 86.9 fL (ref 79.5–101.0)
MONO#: 0.3 10*3/uL (ref 0.1–0.9)
MONO%: 3.6 % (ref 0.0–14.0)
NEUT#: 3.2 10*3/uL (ref 1.5–6.5)
NEUT%: 32.9 % — ABNORMAL LOW (ref 38.4–76.8)
PLATELETS: 280 10*3/uL (ref 145–400)
RBC: 3.76 10*6/uL (ref 3.70–5.45)
RDW: 22.4 % — ABNORMAL HIGH (ref 11.2–14.5)
WBC: 9.6 10*3/uL (ref 3.9–10.3)
lymph#: 5.8 10*3/uL — ABNORMAL HIGH (ref 0.9–3.3)

## 2013-11-29 LAB — COMPREHENSIVE METABOLIC PANEL (CC13)
ALT: 19 U/L (ref 0–55)
ANION GAP: 11 meq/L (ref 3–11)
AST: 14 U/L (ref 5–34)
Albumin: 4 g/dL (ref 3.5–5.0)
Alkaline Phosphatase: 87 U/L (ref 40–150)
BUN: 8.1 mg/dL (ref 7.0–26.0)
CALCIUM: 9.2 mg/dL (ref 8.4–10.4)
CHLORIDE: 113 meq/L — AB (ref 98–109)
CO2: 22 mEq/L (ref 22–29)
CREATININE: 0.8 mg/dL (ref 0.6–1.1)
GLUCOSE: 114 mg/dL (ref 70–140)
Potassium: 3.9 mEq/L (ref 3.5–5.1)
Sodium: 146 mEq/L — ABNORMAL HIGH (ref 136–145)
Total Protein: 6.5 g/dL (ref 6.4–8.3)

## 2013-11-29 LAB — MAGNESIUM (CC13): MAGNESIUM: 2 mg/dL (ref 1.5–2.5)

## 2013-11-29 LAB — TECHNOLOGIST REVIEW

## 2013-11-29 MED ORDER — DIPHENHYDRAMINE HCL 50 MG/ML IJ SOLN
INTRAMUSCULAR | Status: AC
  Filled 2013-11-29: qty 1

## 2013-11-29 MED ORDER — ONDANSETRON 8 MG/50ML IVPB (CHCC)
8.0000 mg | Freq: Once | INTRAVENOUS | Status: AC
  Administered 2013-11-29: 8 mg via INTRAVENOUS

## 2013-11-29 MED ORDER — ACETAMINOPHEN 325 MG PO TABS
ORAL_TABLET | ORAL | Status: AC
  Filled 2013-11-29: qty 2

## 2013-11-29 MED ORDER — DEXAMETHASONE SODIUM PHOSPHATE 20 MG/5ML IJ SOLN
20.0000 mg | Freq: Once | INTRAMUSCULAR | Status: AC
  Administered 2013-11-29: 20 mg via INTRAVENOUS

## 2013-11-29 MED ORDER — HEPARIN SOD (PORK) LOCK FLUSH 100 UNIT/ML IV SOLN
500.0000 [IU] | Freq: Once | INTRAVENOUS | Status: AC | PRN
  Administered 2013-11-29: 500 [IU]
  Filled 2013-11-29: qty 5

## 2013-11-29 MED ORDER — SODIUM CHLORIDE 0.9 % IV SOLN
80.0000 mg/m2 | Freq: Once | INTRAVENOUS | Status: AC
  Administered 2013-11-29: 156 mg via INTRAVENOUS
  Filled 2013-11-29: qty 26

## 2013-11-29 MED ORDER — FAMOTIDINE IN NACL 20-0.9 MG/50ML-% IV SOLN
INTRAVENOUS | Status: AC
  Filled 2013-11-29: qty 50

## 2013-11-29 MED ORDER — SODIUM CHLORIDE 0.9 % IJ SOLN
10.0000 mL | INTRAMUSCULAR | Status: DC | PRN
  Administered 2013-11-29: 10 mL
  Filled 2013-11-29: qty 10

## 2013-11-29 MED ORDER — SODIUM CHLORIDE 0.9 % IV SOLN
Freq: Once | INTRAVENOUS | Status: AC
  Administered 2013-11-29: 12:00:00 via INTRAVENOUS

## 2013-11-29 MED ORDER — DIPHENHYDRAMINE HCL 50 MG/ML IJ SOLN
25.0000 mg | Freq: Once | INTRAMUSCULAR | Status: AC
  Administered 2013-11-29: 25 mg via INTRAVENOUS

## 2013-11-29 MED ORDER — DEXAMETHASONE SODIUM PHOSPHATE 20 MG/5ML IJ SOLN
INTRAMUSCULAR | Status: AC
  Filled 2013-11-29: qty 5

## 2013-11-29 MED ORDER — FAMOTIDINE IN NACL 20-0.9 MG/50ML-% IV SOLN
20.0000 mg | Freq: Once | INTRAVENOUS | Status: AC
  Administered 2013-11-29: 20 mg via INTRAVENOUS

## 2013-11-29 MED ORDER — ONDANSETRON 8 MG/NS 50 ML IVPB
INTRAVENOUS | Status: AC
  Filled 2013-11-29: qty 8

## 2013-11-29 NOTE — Patient Instructions (Signed)
[facility] Discharge Instructions for Patients Receiving Chemotherapy  Today you received the following chemotherapy agents:  Taxol  To help prevent nausea and vomiting after your treatment, we encourage you to take your nausea medication as ordered per MD.   If you develop nausea and vomiting that is not controlled by your nausea medication, call the clinic.   BELOW ARE SYMPTOMS THAT SHOULD BE REPORTED IMMEDIATELY:  *FEVER GREATER THAN 100.5 F  *CHILLS WITH OR WITHOUT FEVER  NAUSEA AND VOMITING THAT IS NOT CONTROLLED WITH YOUR NAUSEA MEDICATION  *UNUSUAL SHORTNESS OF BREATH  *UNUSUAL BRUISING OR BLEEDING  TENDERNESS IN MOUTH AND THROAT WITH OR WITHOUT PRESENCE OF ULCERS  *URINARY PROBLEMS  *BOWEL PROBLEMS  UNUSUAL RASH Items with * indicate a potential emergency and should be followed up as soon as possible.  Feel free to call the clinic you have any questions or concerns. The clinic phone number is [phone].    

## 2013-12-06 ENCOUNTER — Ambulatory Visit (HOSPITAL_BASED_OUTPATIENT_CLINIC_OR_DEPARTMENT_OTHER): Payer: Medicare Other

## 2013-12-06 ENCOUNTER — Other Ambulatory Visit (HOSPITAL_BASED_OUTPATIENT_CLINIC_OR_DEPARTMENT_OTHER): Payer: Medicare Other

## 2013-12-06 ENCOUNTER — Ambulatory Visit (HOSPITAL_BASED_OUTPATIENT_CLINIC_OR_DEPARTMENT_OTHER): Payer: Medicare Other | Admitting: Oncology

## 2013-12-06 ENCOUNTER — Ambulatory Visit: Payer: Medicare Other | Admitting: Physician Assistant

## 2013-12-06 VITALS — BP 144/90 | HR 83 | Temp 98.7°F | Resp 18 | Ht 64.0 in | Wt 180.7 lb

## 2013-12-06 DIAGNOSIS — C50511 Malignant neoplasm of lower-outer quadrant of right female breast: Secondary | ICD-10-CM

## 2013-12-06 DIAGNOSIS — E876 Hypokalemia: Secondary | ICD-10-CM

## 2013-12-06 DIAGNOSIS — Z9010 Acquired absence of unspecified breast and nipple: Secondary | ICD-10-CM

## 2013-12-06 DIAGNOSIS — C50519 Malignant neoplasm of lower-outer quadrant of unspecified female breast: Secondary | ICD-10-CM

## 2013-12-06 DIAGNOSIS — Z853 Personal history of malignant neoplasm of breast: Secondary | ICD-10-CM

## 2013-12-06 DIAGNOSIS — C50919 Malignant neoplasm of unspecified site of unspecified female breast: Secondary | ICD-10-CM

## 2013-12-06 DIAGNOSIS — Z170 Estrogen receptor positive status [ER+]: Secondary | ICD-10-CM

## 2013-12-06 DIAGNOSIS — M8580 Other specified disorders of bone density and structure, unspecified site: Secondary | ICD-10-CM

## 2013-12-06 DIAGNOSIS — Z5111 Encounter for antineoplastic chemotherapy: Secondary | ICD-10-CM

## 2013-12-06 DIAGNOSIS — D649 Anemia, unspecified: Secondary | ICD-10-CM

## 2013-12-06 DIAGNOSIS — C50911 Malignant neoplasm of unspecified site of right female breast: Secondary | ICD-10-CM

## 2013-12-06 DIAGNOSIS — C9110 Chronic lymphocytic leukemia of B-cell type not having achieved remission: Secondary | ICD-10-CM

## 2013-12-06 LAB — CBC WITH DIFFERENTIAL/PLATELET
BASO%: 1.1 % (ref 0.0–2.0)
Basophils Absolute: 0.1 10*3/uL (ref 0.0–0.1)
EOS%: 2.5 % (ref 0.0–7.0)
Eosinophils Absolute: 0.2 10*3/uL (ref 0.0–0.5)
HCT: 31.9 % — ABNORMAL LOW (ref 34.8–46.6)
HGB: 10.2 g/dL — ABNORMAL LOW (ref 11.6–15.9)
LYMPH#: 5.1 10*3/uL — AB (ref 0.9–3.3)
LYMPH%: 59.6 % — ABNORMAL HIGH (ref 14.0–49.7)
MCH: 28.3 pg (ref 25.1–34.0)
MCHC: 32.1 g/dL (ref 31.5–36.0)
MCV: 88.1 fL (ref 79.5–101.0)
MONO#: 0.4 10*3/uL (ref 0.1–0.9)
MONO%: 4.3 % (ref 0.0–14.0)
NEUT%: 32.5 % — ABNORMAL LOW (ref 38.4–76.8)
NEUTROS ABS: 2.8 10*3/uL (ref 1.5–6.5)
Platelets: 228 10*3/uL (ref 145–400)
RBC: 3.62 10*6/uL — AB (ref 3.70–5.45)
RDW: 22 % — ABNORMAL HIGH (ref 11.2–14.5)
WBC: 8.5 10*3/uL (ref 3.9–10.3)

## 2013-12-06 LAB — COMPREHENSIVE METABOLIC PANEL (CC13)
ALBUMIN: 3.8 g/dL (ref 3.5–5.0)
ALT: 31 U/L (ref 0–55)
ANION GAP: 10 meq/L (ref 3–11)
AST: 22 U/L (ref 5–34)
Alkaline Phosphatase: 92 U/L (ref 40–150)
BUN: 12.5 mg/dL (ref 7.0–26.0)
CALCIUM: 9.3 mg/dL (ref 8.4–10.4)
CHLORIDE: 114 meq/L — AB (ref 98–109)
CO2: 21 meq/L — AB (ref 22–29)
Creatinine: 0.8 mg/dL (ref 0.6–1.1)
Glucose: 112 mg/dl (ref 70–140)
Potassium: 4.4 mEq/L (ref 3.5–5.1)
SODIUM: 145 meq/L (ref 136–145)
TOTAL PROTEIN: 6.4 g/dL (ref 6.4–8.3)
Total Bilirubin: <0.2 mg/dL (ref 0.20–1.20)

## 2013-12-06 LAB — TECHNOLOGIST REVIEW

## 2013-12-06 MED ORDER — DIPHENHYDRAMINE HCL 50 MG/ML IJ SOLN
25.0000 mg | Freq: Once | INTRAMUSCULAR | Status: AC
  Administered 2013-12-06: 25 mg via INTRAVENOUS

## 2013-12-06 MED ORDER — DEXAMETHASONE SODIUM PHOSPHATE 20 MG/5ML IJ SOLN
INTRAMUSCULAR | Status: AC
  Filled 2013-12-06: qty 5

## 2013-12-06 MED ORDER — ONDANSETRON 8 MG/50ML IVPB (CHCC)
8.0000 mg | Freq: Once | INTRAVENOUS | Status: AC
  Administered 2013-12-06: 8 mg via INTRAVENOUS

## 2013-12-06 MED ORDER — DEXAMETHASONE SODIUM PHOSPHATE 20 MG/5ML IJ SOLN
20.0000 mg | Freq: Once | INTRAMUSCULAR | Status: AC
  Administered 2013-12-06: 20 mg via INTRAVENOUS

## 2013-12-06 MED ORDER — FAMOTIDINE IN NACL 20-0.9 MG/50ML-% IV SOLN
20.0000 mg | Freq: Once | INTRAVENOUS | Status: AC
  Administered 2013-12-06: 20 mg via INTRAVENOUS

## 2013-12-06 MED ORDER — DIPHENHYDRAMINE HCL 50 MG/ML IJ SOLN
INTRAMUSCULAR | Status: AC
  Filled 2013-12-06: qty 1

## 2013-12-06 MED ORDER — HEPARIN SOD (PORK) LOCK FLUSH 100 UNIT/ML IV SOLN
500.0000 [IU] | Freq: Once | INTRAVENOUS | Status: AC | PRN
  Administered 2013-12-06: 500 [IU]
  Filled 2013-12-06: qty 5

## 2013-12-06 MED ORDER — SODIUM CHLORIDE 0.9 % IV SOLN
Freq: Once | INTRAVENOUS | Status: AC
  Administered 2013-12-06: 10:00:00 via INTRAVENOUS

## 2013-12-06 MED ORDER — SODIUM CHLORIDE 0.9 % IV SOLN
80.0000 mg/m2 | Freq: Once | INTRAVENOUS | Status: AC
  Administered 2013-12-06: 156 mg via INTRAVENOUS
  Filled 2013-12-06: qty 26

## 2013-12-06 MED ORDER — SODIUM CHLORIDE 0.9 % IJ SOLN
10.0000 mL | INTRAMUSCULAR | Status: DC | PRN
  Administered 2013-12-06: 10 mL
  Filled 2013-12-06: qty 10

## 2013-12-06 MED ORDER — ONDANSETRON 8 MG/NS 50 ML IVPB
INTRAVENOUS | Status: AC
  Filled 2013-12-06: qty 8

## 2013-12-06 MED ORDER — FAMOTIDINE IN NACL 20-0.9 MG/50ML-% IV SOLN
INTRAVENOUS | Status: AC
  Filled 2013-12-06: qty 50

## 2013-12-06 NOTE — Patient Instructions (Signed)
[facility] Discharge Instructions for Patients Receiving Chemotherapy  Today you received the following chemotherapy agents TAXOL   To help prevent nausea and vomiting after your treatment, we encourage you to take your nausea medication AS DIRECTED BY MD   If you develop nausea and vomiting that is not controlled by your nausea medication, call the clinic.   BELOW ARE SYMPTOMS THAT SHOULD BE REPORTED IMMEDIATELY:  *FEVER GREATER THAN 100.5 F  *CHILLS WITH OR WITHOUT FEVER  NAUSEA AND VOMITING THAT IS NOT CONTROLLED WITH YOUR NAUSEA MEDICATION  *UNUSUAL SHORTNESS OF BREATH  *UNUSUAL BRUISING OR BLEEDING  TENDERNESS IN MOUTH AND THROAT WITH OR WITHOUT PRESENCE OF ULCERS  *URINARY PROBLEMS  *BOWEL PROBLEMS  UNUSUAL RASH Items with * indicate a potential emergency and should be followed up as soon as possible.  Feel free to call the clinic you have any questions or concerns. The clinic phone number is [phone].

## 2013-12-06 NOTE — Progress Notes (Signed)
ID: [name] [name] OB: 10-Jun-1946  MR#: [id number]  EYC#:[id number]  PCP: [name], MD GYN:  M. Edwinna Areola SU: Donnie Mesa, Lowella Bandy,  OTHER MD: Christene Slates, Dian Situ, Brien Few  CHIEF COMPLAINT:  Adjuvant chemotherapy for Right Breast Cancer   BREAST CANCER HISTORY: As per previously documented note:  [name] [name] has a history of breast cancer dating back to 1997, [name] [name] Dr. Annabell Sabal performed a right lumpectomy and axillary lymph node dissection for what according to the patient and her family was a stage I invasive ductal breast cancer. She received radiation adjuvantly and then took tamoxifen for 5 years  In February of 2014 she had a normal screening mammography, but in November 2014 she felt a "hard lump" in her right breast. She brought this to the attention of her urologist, Dr. Izora Gala, and he set her up for bilateral diagnostic mammography with mammography at [facility] 07/13/2013. This showed her breast density to be category B. There was no mammographic abnormality noted but ultrasonography showed a 1.4 cm irregularly shaped solid mass in the right breast at the 8:00 position. This was biopsied the same day, and showed ([id number]) an invasive ductal carcinoma, grade 2, estrogen receptor 100% positive, progesterone receptor 13% positive, with an MIB-1 of 33% and HER-2 amplification with a HER-2: CEP 17 ratio of 3.19, and a HER-2 copy number percent all of 4.15.  The patient's subsequent history is as detailed below  INTERVAL HISTORY: [name] [name] returns today for followup of her recurrent right breast cancer. This is day 15 cycle 4 of paclitaxel, or more simply stated this is her 12th and last dose of paclitaxel. From this point she will only receive the anti-HER-2 treatment.  REVIEW OF SYSTEMS: We held at the pertuzumab on cycles 3 for a diarrhea problems. We tried to reinstated at half dose on cycle 4, but even with this she had significant problems with  diarrhea. She had cramps in her stomach, and bowel movements are painful. We're going to leave the pertuzumab off her maintenance treatment. She has not had any peripheral neuropathy except for the first and second toes in her left foot, which is likely unrelated. Her appetite is down. There has been no taste perversion. She has discomfort/pain in her back and neck as well as other joints. This is not more intense or persistent than before. She complains of seasonal allergies with sinus problems, runny nose, and a dry cough. She can feel short of breath at times. She has heartburn. She feels anxious and depressed. A detailed review of systems was otherwise stable.    PAST MEDICAL HISTORY: Past Medical History  Diagnosis Date  . Sinus tachycardia     mild resting  . HLD (hyperlipidemia)   . Orthostatic hypotension     slight in the past  . Anxiety   . Depression   . Diverticulosis   . Fibromyalgia 1978  . Hypothyroidism   . Alopecia   . Ruptured disk     one in neck and two in back  . Plantar fasciitis   . GERD (gastroesophageal reflux disease)   . History of syncope     episode 2008--  no recurrence since  . Ureteral calculi     bilateral  . Leukocytosis   . Chronic back pain     "lower back and upper neck" (07/28/2013)  . 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 ear   . Urgency of urination   . Hematuria   . Sensation  of pressure in bladder area   . Wears glasses   . Ecchymosis     FROM IV AND LAB WORK THE PAST WEEK  06-17-2013  . Stool incontinence     "at times recently"   . Kidney stones 2014  . CAP (community acquired pneumonia)     admission 06-04-2013 secondary to failed oral antibitotics---  last cxr 06-16-2013  much improved  . SOB (shortness of breath) 06-17-2013 CURRENTLY RESIDUAL FROM RECENT CAP    SOB from obesity and deconditioning 2009- eval included CPX  . Arthritis     "spine" (07/28/2013)  . DDD (degenerative disc disease)   . CLL (chronic lymphocytic leukemia) dx'd 05/2013     "I don't think I have it though" (07/28/2013)  . Breast cancer 1997; 2014    right    PAST SURGICAL HISTORY: Past Surgical History  Procedure Laterality Date  . Lumbar epidural injection      has had 7 injections  . Total abdominal hysterectomy w/ bilateral salpingoophorectomy  1997  . Breast lumpectomy with axillary lymph node dissection Right 1997  . Transthoracic echocardiogram  05-11-2007  dr Ron Parker    normal lvf/  ef 65%  . Tonsillectomy  AGE 29  . Retinal detachment surgery Right 2013  . Cystoscopy with retrograde pyelogram, ureteroscopy and stent placement Bilateral 06/17/2013    Procedure: CYSTOSCOPY WITH RETROGRADE PYELOGRAM, URETEROSCOPY AND LEFT DOUBLE  J STENT PLACEMENT RIGHT URETERAL HOLMIIUM LASER AND DOUBLE J STENT ;  Surgeon: Hanley Ben, MD;  Location: [facility];  Service: Urology;  Laterality: Bilateral;  . Holmium laser application Bilateral 89/21/1941    Procedure: HOLMIUM LASER APPLICATION;  Surgeon: Hanley Ben, MD;  Location: [facility];  Service: Urology;  Laterality: Bilateral;  . Mastectomy w/ nodes partial Right 1997  . Mastectomy complete / simple Right 07/28/2013  . Appendectomy  1997  . Breast biopsy Right 2014  . Cystoscopy with ureteroscopy, stone basketry and stent placement Bilateral 2014  . Lithotripsy Left ~ 06/2013  . Simple mastectomy with axillary sentinel node biopsy Right 07/28/2013    Procedure: RIGHT TOTAL  MASTECTOMY;  Surgeon: Imogene Burn. Georgette Dover, MD;  Location: [facility];  Service: General;  Laterality: Right;  . Portacath placement Left 07/28/2013    Procedure: ATTEMPTED INSERTION PORT-A-CATH;  Surgeon: Imogene Burn. Georgette Dover, MD;  Location: [facility];  Service: General;  Laterality: Left;    FAMILY HISTORY Family History  Problem Relation Age of Onset  . Heart disease Maternal Grandfather   . Diabetes Maternal Grandfather   . Colon cancer Maternal Aunt 79  . Brain cancer Paternal Grandmother     dx in 79s  .  Dementia Mother   . Diabetes Mother   . Osteoporosis Mother   . Diabetes Maternal Aunt   . Prostate cancer Father 56  . Bipolar disorder Maternal Aunt   . Stroke Maternal Aunt   . Stomach cancer Paternal Uncle     dx in late 40s   the patient's mother died at the age of 1. The patient's father is alive at age 45. She had no brothers or sisters. Her father has a history of prostate cancer. There is no history of breast or ovarian cancer in the family. One maternal first cousin has a history of Hodgkin's lymphoma.  GYNECOLOGIC HISTORY:  Menarche age 77, first live birth age 20. She is GX P1. She underwent total abdominal hysterectomy and bilateral salpingo-oophorectomy in 1997 she did not take hormone replacement.  SOCIAL HISTORY: (Updated  11/15/2013)  She is a homemaker. Her husband Arnette Norris farms approximately 500 acres. 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 The patient has no grandchildren. She has two "grand cats". She attends a local [employer] church of [place]: Not in place   HEALTH MAINTENANCE:  (Updated   11/15/2013 ) History  Substance Use Topics  . Smoking status: Never Smoker   . Smokeless tobacco: Never Used  . Alcohol Use: Yes     Comment: 07/28/2013 "no alcohol since 1994; never had problem w/it"     Colonoscopy: 2009/ [facility]  PAP: Status post hysterectomy  Bone density: May 10/14/2009 at [facility] was normal  Lipid panel:  Not on file   Allergies  Allergen Reactions  . Amoxicillin Itching  . Lasix [Furosemide] Nausea And Vomiting and Other (See Comments)    headache  . Lithium Other (See Comments)    Dizziness, "cause me to fall"  . Sulfa Antibiotics Hives  . Trazodone And Nefazodone Other (See Comments)    insomnia  . Lyrica [Pregabalin] Diarrhea, Nausea Only and Rash    Current Outpatient Prescriptions  Medication Sig Dispense Refill  . ALPRAZolam (XANAX) 0.5  MG tablet TAKE 1 TABLET EVERY 8 HOURS AS NEEDED  30 tablet  0  . amitriptyline (ELAVIL) 150 MG tablet Take 150 mg by mouth at bedtime.      . Ascorbic Acid (VITAMIN C) 1000 MG tablet Take 1,000 mg by mouth daily.      Marland Kitchen buPROPion (WELLBUTRIN SR) 150 MG 12 hr tablet Take 150-300 mg by mouth 2 (two) times daily. Take 349m in the morning and 1556mat 2pm      . cetirizine (ZYRTEC) 10 MG tablet TAKE ONE TABLET BY MOUTH DAILY AS NEEDED  30 tablet  4  . Cholecalciferol (VITAMIN D-3) 1000 UNITS CAPS Take 5,000 Units by mouth daily.      . cholestyramine (QUESTRAN) 4 G packet Take 1 packet (4 g total) by mouth 2 (two) times daily as needed (diarrhea). Take with meals.  40 each  1  . ibuprofen (ADVIL,MOTRIN) 200 MG tablet Take 800 mg by mouth every 6 (six) hours as needed for moderate pain.      . Marland Kitchenevothyroxine (SYNTHROID, LEVOTHROID) 50 MCG tablet Take 50 mcg by mouth daily before breakfast.      . lidocaine-prilocaine (EMLA) cream Apply 1 application topically as needed.  30 g  3  . metoprolol succinate (TOPROL-XL) 25 MG 24 hr tablet Take 25 mg by mouth daily.      . minoxidil (ROGAINE) 2 % external solution Apply 1 application topically 2 (two) times daily as needed.       . Multiple Vitamins-Minerals (HAIR VITAMINS PO) Take 1 tablet by mouth 2 (two) times daily.      . Omega-3 Fatty Acids (FISH OIL PO) Take 1 capsule by mouth daily.       . Marland Kitchenmeprazole (PRILOSEC) 40 MG capsule Take 1 capsule (40 mg total) by mouth every morning.  30 capsule  3  . ondansetron (ZOFRAN) 8 MG tablet 1 tab by mouth at dinner on day of chemo, then 1 tab with breakfast and dinner on day after chemo.  Then 1 tab every 12 hrs PRN nausea  30 tablet  1  . Oxcarbazepine (TRILEPTAL) 300 MG tablet Take 300 mg by mouth 2 (two) times daily.      . Marland KitchenxyCODONE-acetaminophen (PERCOCET/ROXICET)  5-325 MG per tablet Take 1 tablet by mouth every 4 (four) hours as needed for moderate pain.  40 tablet  0  . potassium chloride (MICRO-K) 10 MEQ CR  capsule 2 tabs by mouth in the morning and 1 tab by mouth in the evening  90 capsule  3  . prochlorperazine (COMPAZINE) 10 MG tablet Take 1 tablet (10 mg total) by mouth 4 (four) times daily -  before meals and at bedtime. Starting at supper day before treatment, then evening of treatment and continue thru next day, then PRN  30 tablet  1  . Vilazodone HCl (VIIBRYD) 20 MG TABS Take 1 tablet by mouth daily.      . vitamin B-12 (CYANOCOBALAMIN) 1000 MCG tablet Take 1,000 mcg by mouth daily.       No current facility-administered medications for this visit.    OBJECTIVE: 68 year old white woman who appears stated age 51 Vitals:   12/06/13 0854  BP: 144/90  Pulse: 83  Temp: 98.7 F (37.1 C)  Resp: 18     Body mass index is 31 kg/(m^2).    ECOG FS:1 - Symptomatic but completely ambulatory Filed Weights   12/06/13 0854  Weight: 180 lb 11.2 oz (81.965 kg)   Sclerae unicteric, pupils equal and reactive Oropharynx clear and moist No cervical or supraclavicular adenopathy Lungs no rales or rhonchi Heart regular rate and rhythm Abd soft, nontender, positive bowel sounds MSK no focal spinal tenderness, no upper extremity lymphedema Neuro: nonfocal, well oriented, appropriate affect Breasts: Deferred  LAB RESULTS:   Lab Results  Component Value Date   WBC 8.5 12/06/2013   NEUTROABS 2.8 12/06/2013   HGB 10.2* 12/06/2013   HCT 31.9* 12/06/2013   MCV 88.1 12/06/2013   PLT 228 12/06/2013      Chemistry      Component Value Date/Time   NA 145 12/06/2013 0827   NA 145 07/30/2013 0422   K 4.4 12/06/2013 0827   K 3.9 07/30/2013 0422   CL 111 07/30/2013 0422   CO2 21* 12/06/2013 0827   CO2 25 07/30/2013 0422   BUN 12.5 12/06/2013 0827   BUN 6 07/30/2013 0422   CREATININE 0.8 12/06/2013 0827   CREATININE 0.76 07/30/2013 0422      Component Value Date/Time   CALCIUM 9.3 12/06/2013 0827   CALCIUM 8.3* 07/30/2013 0422   ALKPHOS 92 12/06/2013 0827   ALKPHOS 196* 06/04/2013 2221   AST 22 12/06/2013  0827   AST 30 06/04/2013 2221   ALT 31 12/06/2013 0827   ALT 45* 06/04/2013 2221   BILITOT <0.20 12/06/2013 0827   BILITOT 0.3 06/04/2013 2221      STUDIES:  An echocardiogram on 11/09/2013 showed an ejection fraction of 55-60%.    ASSESSMENT: 68 y.o. BRCA negative [place] woman  (1) status post right lumpectomy and axillary lymph node dissection in 1997 for a stage I breast cancer, treated with adjuvant radiation and tamoxifen for 5 years  (2) status post right breast biopsy 07/13/2013 for a clinical T1c N0, stage IA invasive ductal carcinoma, grade 2, estrogen receptor 100% positive, progesterone receptor 13% positive, with an MIB-1 of 33%, and HER-2 amplification by CISH with a HER2/CEP 17 ratio of 3.19, and an average HER-2 copy number per cell of 4.15  (3) status post right mastectomy 07/28/2013 for a pT1c pN0, stage IA invasive ductal carcinoma, grade 3, with close but negative margins. Prognostic panel was not repeated  (4) completed weekly paclitaxel x12 12/06/2913, with trastuzumab/  pertuzumab every 3 weeks; pertuzumab was held with third dose on 11/01/2013 due to diarrhea, tried a half dose on cycle 4 again with diarrhea developing  (5) trastuzumab (started 09/20/2013) to be continued for 1 year; most recent echocardiogram on 11/09/2013 shows a well preserved ejection fraction   OTHER PROBLEMS:  (a) History of chronic lymphoid leukemia diagnosed by flow cytometry 06/29/2013, the cells being CD5, CD20 and CD23 positive, CD10 negative.   (b) Anemia with a normal MCV and normal ferritin  (c) genetics testing April 2015 negative (comprehensive panel)  (d) the patient may consider eventual reconstruction   PLAN: Rylei has completed the chemotherapy portion of her treatment. It has been an ordeal not so much because of the chemotherapy itself, since she tolerated the tamoxifen with no major side effects, but because of the pertuzumab, which has caused her significant  diarrhea. This happened even when we cut the dose in half.  Accordingly for maintenance and target treatment we will do trastuzumab alone. Her next treatment will be April 21. She is going to see me again in may, and assuming her symptoms have sufficiently improved by that time we will consider starting anastrozole. She had a normal DEXA scan in may of 2011 at [facility] and we will repeat that before that visit.  Mylah has a good understanding of the overall plan. She agrees with it. She knows to call for any problems that may develop before her next visit here.   Chauncey Cruel, MD   Medical oncology  12/06/2013 9:13 AM

## 2013-12-13 ENCOUNTER — Other Ambulatory Visit (HOSPITAL_BASED_OUTPATIENT_CLINIC_OR_DEPARTMENT_OTHER): Payer: Medicare Other

## 2013-12-13 ENCOUNTER — Telehealth: Payer: Self-pay | Admitting: Oncology

## 2013-12-13 ENCOUNTER — Telehealth: Payer: Self-pay | Admitting: *Deleted

## 2013-12-13 ENCOUNTER — Ambulatory Visit (HOSPITAL_BASED_OUTPATIENT_CLINIC_OR_DEPARTMENT_OTHER): Payer: Medicare Other

## 2013-12-13 VITALS — BP 120/95 | HR 97 | Temp 98.8°F

## 2013-12-13 DIAGNOSIS — C50919 Malignant neoplasm of unspecified site of unspecified female breast: Secondary | ICD-10-CM

## 2013-12-13 DIAGNOSIS — C50511 Malignant neoplasm of lower-outer quadrant of right female breast: Secondary | ICD-10-CM

## 2013-12-13 DIAGNOSIS — E876 Hypokalemia: Secondary | ICD-10-CM

## 2013-12-13 DIAGNOSIS — C50519 Malignant neoplasm of lower-outer quadrant of unspecified female breast: Secondary | ICD-10-CM

## 2013-12-13 DIAGNOSIS — Z5112 Encounter for antineoplastic immunotherapy: Secondary | ICD-10-CM

## 2013-12-13 DIAGNOSIS — R197 Diarrhea, unspecified: Secondary | ICD-10-CM

## 2013-12-13 LAB — CBC WITH DIFFERENTIAL/PLATELET
BASO%: 0.5 % (ref 0.0–2.0)
Basophils Absolute: 0.1 10*3/uL (ref 0.0–0.1)
EOS%: 2.2 % (ref 0.0–7.0)
Eosinophils Absolute: 0.2 10*3/uL (ref 0.0–0.5)
HCT: 35.1 % (ref 34.8–46.6)
HGB: 10.9 g/dL — ABNORMAL LOW (ref 11.6–15.9)
LYMPH#: 6.5 10*3/uL — AB (ref 0.9–3.3)
LYMPH%: 59.3 % — ABNORMAL HIGH (ref 14.0–49.7)
MCH: 28 pg (ref 25.1–34.0)
MCHC: 31.1 g/dL — AB (ref 31.5–36.0)
MCV: 90.2 fL (ref 79.5–101.0)
MONO#: 0.8 10*3/uL (ref 0.1–0.9)
MONO%: 7.4 % (ref 0.0–14.0)
NEUT#: 3.3 10*3/uL (ref 1.5–6.5)
NEUT%: 30.6 % — ABNORMAL LOW (ref 38.4–76.8)
Platelets: 235 10*3/uL (ref 145–400)
RBC: 3.89 10*6/uL (ref 3.70–5.45)
RDW: 19.9 % — AB (ref 11.2–14.5)
WBC: 10.9 10*3/uL — ABNORMAL HIGH (ref 3.9–10.3)

## 2013-12-13 LAB — COMPREHENSIVE METABOLIC PANEL (CC13)
ALT: 27 U/L (ref 0–55)
AST: 19 U/L (ref 5–34)
Albumin: 4 g/dL (ref 3.5–5.0)
Alkaline Phosphatase: 94 U/L (ref 40–150)
Anion Gap: 11 mEq/L (ref 3–11)
BUN: 11.6 mg/dL (ref 7.0–26.0)
CHLORIDE: 111 meq/L — AB (ref 98–109)
CO2: 19 mEq/L — ABNORMAL LOW (ref 22–29)
CREATININE: 0.8 mg/dL (ref 0.6–1.1)
Calcium: 9.6 mg/dL (ref 8.4–10.4)
Glucose: 134 mg/dl (ref 70–140)
Potassium: 3.9 mEq/L (ref 3.5–5.1)
SODIUM: 141 meq/L (ref 136–145)
Total Bilirubin: <0.2 mg/dL (ref 0.20–1.20)
Total Protein: 6.6 g/dL (ref 6.4–8.3)

## 2013-12-13 LAB — MAGNESIUM (CC13): Magnesium: 1.9 mg/dl (ref 1.5–2.5)

## 2013-12-13 MED ORDER — SODIUM CHLORIDE 0.9 % IV SOLN
6.0000 mg/kg | Freq: Once | INTRAVENOUS | Status: AC
  Administered 2013-12-13: 483 mg via INTRAVENOUS
  Filled 2013-12-13: qty 23

## 2013-12-13 MED ORDER — ACETAMINOPHEN 325 MG PO TABS
650.0000 mg | ORAL_TABLET | Freq: Once | ORAL | Status: AC
  Administered 2013-12-13: 650 mg via ORAL

## 2013-12-13 MED ORDER — SODIUM CHLORIDE 0.9 % IJ SOLN
10.0000 mL | INTRAMUSCULAR | Status: DC | PRN
  Administered 2013-12-13: 10 mL
  Filled 2013-12-13: qty 10

## 2013-12-13 MED ORDER — ACETAMINOPHEN 325 MG PO TABS
ORAL_TABLET | ORAL | Status: AC
  Filled 2013-12-13: qty 2

## 2013-12-13 MED ORDER — SODIUM CHLORIDE 0.9 % IV SOLN
Freq: Once | INTRAVENOUS | Status: AC
  Administered 2013-12-13: 11:00:00 via INTRAVENOUS

## 2013-12-13 MED ORDER — HEPARIN SOD (PORK) LOCK FLUSH 100 UNIT/ML IV SOLN
500.0000 [IU] | Freq: Once | INTRAVENOUS | Status: AC | PRN
  Administered 2013-12-13: 500 [IU]
  Filled 2013-12-13: qty 5

## 2013-12-13 NOTE — Telephone Encounter (Signed)
, °

## 2013-12-13 NOTE — Telephone Encounter (Signed)
Per staff message and POF I have scheduled appts.  JMW  

## 2013-12-13 NOTE — Patient Instructions (Signed)
[facility] Discharge Instructions for Patients Receiving Chemotherapy  Today you received the following chemotherapy agents herceptin   To help prevent nausea and vomiting after your treatment, we encourage you to take your nausea medication as directed   If you develop nausea and vomiting that is not controlled by your nausea medication, call the clinic.   BELOW ARE SYMPTOMS THAT SHOULD BE REPORTED IMMEDIATELY:  *FEVER GREATER THAN 100.5 F  *CHILLS WITH OR WITHOUT FEVER  NAUSEA AND VOMITING THAT IS NOT CONTROLLED WITH YOUR NAUSEA MEDICATION  *UNUSUAL SHORTNESS OF BREATH  *UNUSUAL BRUISING OR BLEEDING  TENDERNESS IN MOUTH AND THROAT WITH OR WITHOUT PRESENCE OF ULCERS  *URINARY PROBLEMS  *BOWEL PROBLEMS  UNUSUAL RASH Items with * indicate a potential emergency and should be followed up as soon as possible.  Feel free to call the clinic you have any questions or concerns. The clinic phone number is [phone].  

## 2013-12-15 LAB — HM DEXA SCAN: HM Dexa Scan: NORMAL

## 2013-12-19 ENCOUNTER — Telehealth: Payer: Self-pay | Admitting: *Deleted

## 2013-12-19 NOTE — Telephone Encounter (Signed)
Received call from Dr. Payton Emerald office.   Reports that patient has been seen for conjunctivitis and Opthalmologist wanted to prescribe Keflex.   Requested PCP to advise on ABTx for patient since patient is undergoing chemotherapy and has many allergies.   Dr. Dennard Schaumann made aware and advised that patient has amoxicillin allergy. Advised cipro eye gtts.

## 2014-01-03 ENCOUNTER — Other Ambulatory Visit (HOSPITAL_BASED_OUTPATIENT_CLINIC_OR_DEPARTMENT_OTHER): Payer: Medicare Other

## 2014-01-03 ENCOUNTER — Encounter: Payer: Self-pay | Admitting: *Deleted

## 2014-01-03 ENCOUNTER — Telehealth: Payer: Self-pay | Admitting: *Deleted

## 2014-01-03 ENCOUNTER — Telehealth: Payer: Self-pay | Admitting: Oncology

## 2014-01-03 ENCOUNTER — Ambulatory Visit (HOSPITAL_BASED_OUTPATIENT_CLINIC_OR_DEPARTMENT_OTHER): Payer: Medicare Other | Admitting: Oncology

## 2014-01-03 VITALS — BP 114/77 | HR 91 | Temp 98.5°F | Resp 18 | Ht 64.0 in | Wt 181.0 lb

## 2014-01-03 DIAGNOSIS — C50519 Malignant neoplasm of lower-outer quadrant of unspecified female breast: Secondary | ICD-10-CM

## 2014-01-03 DIAGNOSIS — C50511 Malignant neoplasm of lower-outer quadrant of right female breast: Secondary | ICD-10-CM

## 2014-01-03 DIAGNOSIS — D539 Nutritional anemia, unspecified: Secondary | ICD-10-CM

## 2014-01-03 DIAGNOSIS — D649 Anemia, unspecified: Secondary | ICD-10-CM

## 2014-01-03 DIAGNOSIS — C9110 Chronic lymphocytic leukemia of B-cell type not having achieved remission: Secondary | ICD-10-CM

## 2014-01-03 DIAGNOSIS — C50919 Malignant neoplasm of unspecified site of unspecified female breast: Secondary | ICD-10-CM

## 2014-01-03 DIAGNOSIS — Z853 Personal history of malignant neoplasm of breast: Secondary | ICD-10-CM

## 2014-01-03 DIAGNOSIS — Z170 Estrogen receptor positive status [ER+]: Secondary | ICD-10-CM

## 2014-01-03 LAB — CBC & DIFF AND RETIC
BASO%: 0.8 % (ref 0.0–2.0)
Basophils Absolute: 0.1 10*3/uL (ref 0.0–0.1)
EOS ABS: 0.5 10*3/uL (ref 0.0–0.5)
EOS%: 3.5 % (ref 0.0–7.0)
HCT: 34.8 % (ref 34.8–46.6)
HGB: 11 g/dL — ABNORMAL LOW (ref 11.6–15.9)
Immature Retic Fract: 13.3 % — ABNORMAL HIGH (ref 1.60–10.00)
LYMPH%: 49.9 % — ABNORMAL HIGH (ref 14.0–49.7)
MCH: 28.8 pg (ref 25.1–34.0)
MCHC: 31.6 g/dL (ref 31.5–36.0)
MCV: 91.1 fL (ref 79.5–101.0)
MONO#: 1.3 10*3/uL — AB (ref 0.1–0.9)
MONO%: 10.1 % (ref 0.0–14.0)
NEUT%: 35.7 % — ABNORMAL LOW (ref 38.4–76.8)
NEUTROS ABS: 4.7 10*3/uL (ref 1.5–6.5)
PLATELETS: 217 10*3/uL (ref 145–400)
RBC: 3.82 10*6/uL (ref 3.70–5.45)
RDW: 16.5 % — ABNORMAL HIGH (ref 11.2–14.5)
RETIC %: 1.75 % (ref 0.70–2.10)
Retic Ct Abs: 66.85 10*3/uL (ref 33.70–90.70)
WBC: 13.2 10*3/uL — AB (ref 3.9–10.3)
lymph#: 6.6 10*3/uL — ABNORMAL HIGH (ref 0.9–3.3)

## 2014-01-03 LAB — COMPREHENSIVE METABOLIC PANEL (CC13)
ALBUMIN: 4 g/dL (ref 3.5–5.0)
ALK PHOS: 87 U/L (ref 40–150)
ALT: 21 U/L (ref 0–55)
AST: 18 U/L (ref 5–34)
Anion Gap: 14 mEq/L — ABNORMAL HIGH (ref 3–11)
BUN: 11.5 mg/dL (ref 7.0–26.0)
CO2: 22 mEq/L (ref 22–29)
Calcium: 9.7 mg/dL (ref 8.4–10.4)
Chloride: 109 mEq/L (ref 98–109)
Creatinine: 0.9 mg/dL (ref 0.6–1.1)
GLUCOSE: 98 mg/dL (ref 70–140)
POTASSIUM: 3.9 meq/L (ref 3.5–5.1)
SODIUM: 146 meq/L — AB (ref 136–145)
TOTAL PROTEIN: 6.6 g/dL (ref 6.4–8.3)
Total Bilirubin: <0.2 mg/dL (ref 0.20–1.20)

## 2014-01-03 LAB — FOLATE: Folate: 18.1 ng/mL

## 2014-01-03 LAB — VITAMIN B12: Vitamin B-12: 539 pg/mL (ref 211–911)

## 2014-01-03 LAB — FERRITIN CHCC: Ferritin: 12 ng/ml (ref 9–269)

## 2014-01-03 NOTE — Telephone Encounter (Signed)
tlkwd w/MW to sch trtms

## 2014-01-03 NOTE — Telephone Encounter (Signed)
Per staff phone call and POF I have schedueld appts.  JMW  

## 2014-01-03 NOTE — Progress Notes (Signed)
ID: [name] OB: 08-12-1946  MR#: [id number]  OMV#:[id number]  PCP: [name], MD GYN:  M. Edwinna Areola SU: Donnie Mesa, Lowella Bandy,  OTHER MD: Christene Slates, Tse Bonito, [facility] Arta Bruce  CHIEF COMPLAINT:  Adjuvant chemotherapy for Right Breast Cancer   BREAST CANCER HISTORY:  Jya has a history of breast cancer dating back to 1997, [name] performed a right lumpectomy and axillary lymph node dissection for what according to the patient and her family was a stage I invasive ductal breast cancer. She received radiation adjuvantly and then took tamoxifen for 5 years  In February of 2014 she had a normal screening mammography, but in November 2014 she felt a "hard lump" in her right breast. She brought this to the attention of her urologist, Dr. Izora Gala, and he set her up for bilateral diagnostic mammography with mammography at [facility] 07/13/2013. This showed her breast density to be category B. There was no mammographic abnormality noted but ultrasonography showed a 1.4 cm irregularly shaped solid mass in the right breast at the 8:00 position. This was biopsied the same day, and showed ([id number]) an invasive ductal carcinoma, grade 2, estrogen receptor 100% positive, progesterone receptor 13% positive, with an MIB-1 of 33% and HER-2 amplification with a HER-2: CEP 17 ratio of 3.19, and a HER-2 copy number percent all of 4.15.  The patient's subsequent history is as detailed below  INTERVAL HISTORY: Yukiko returns today for followup of her recurrent right breast cancer. She is currently receiving trastuzumab every 3 weeks. She completed her paclitaxel treatments 12/06/2013. We have held her pertuzumab since 11/22/2013 because of diarrhea issues. Her next dose of trastuzumab will be 01/12/2014. She is here today to discuss anti-estrogens  REVIEW OF SYSTEMS: She had minimal diarrhea a few days ago, which she took Imodium for and it has not  recurred. No one else at home have that problem. She feels tired. She is not exercising. She does recall that time. Sometimes she has a stabbing pain in her right chest wall. This is very brief period just sinus problems and a cough that is of concern to her because her mother had a bad cough. Her mother is now in a nursing home with dementia. Heartburn is well controlled on omeprazole. She does have some back and joint pain and it may be difficult to differentiate this from the arthralgias and myalgias that may occur with aromatase inhibitors. She admits to anxiety and depression. Hot flashes are moderate. A detailed review of systems today was otherwise noncontributory  PAST MEDICAL HISTORY: Past Medical History  Diagnosis Date  . Sinus tachycardia     mild resting  . HLD (hyperlipidemia)   . Orthostatic hypotension     slight in the past  . Anxiety   . Depression   . Diverticulosis   . Fibromyalgia 1978  . Hypothyroidism   . Alopecia   . Ruptured disk     one in neck and two in back  . Plantar fasciitis   . GERD (gastroesophageal reflux disease)   . History of syncope     episode 2008--  no recurrence since  . Ureteral calculi     bilateral  . Leukocytosis   . Chronic back pain     "lower back and upper neck" (07/28/2013)  . 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 ear   . Urgency of urination   . Hematuria   . Sensation of pressure in bladder area   .  Wears glasses   . Ecchymosis     FROM IV AND LAB WORK THE PAST WEEK  06-17-2013  . Stool incontinence     "at times recently"   . Kidney stones 2014  . CAP (community acquired pneumonia)     admission 06-04-2013 secondary to failed oral antibitotics---  last cxr 06-16-2013  much improved  . SOB (shortness of breath) 06-17-2013 CURRENTLY RESIDUAL FROM RECENT CAP    SOB from obesity and deconditioning 2009- eval included CPX  . Arthritis     "spine" (07/28/2013)  . DDD (degenerative disc disease)   . CLL (chronic lymphocytic leukemia) dx'd 05/2013     "I don't think I have it though" (07/28/2013)  . Breast cancer 1997; 2014    right    PAST SURGICAL HISTORY: Past Surgical History  Procedure Laterality Date  . Lumbar epidural injection      has had 7 injections  . Total abdominal hysterectomy w/ bilateral salpingoophorectomy  1997  . Breast lumpectomy with axillary lymph node dissection Right 1997  . Transthoracic echocardiogram  05-11-2007  dr Ron Parker    normal lvf/  ef 65%  . Tonsillectomy  AGE 31  . Retinal detachment surgery Right 2013  . Cystoscopy with retrograde pyelogram, ureteroscopy and stent placement Bilateral 06/17/2013    Procedure: CYSTOSCOPY WITH RETROGRADE PYELOGRAM, URETEROSCOPY AND LEFT DOUBLE  J STENT PLACEMENT RIGHT URETERAL HOLMIIUM LASER AND DOUBLE J STENT ;  Surgeon: Hanley Ben, MD;  Location: [place];  Service: Urology;  Laterality: Bilateral;  . Holmium laser application Bilateral 20/35/5974    Procedure: HOLMIUM LASER APPLICATION;  Surgeon: Hanley Ben, MD;  Location: [place];  Service: Urology;  Laterality: Bilateral;  . Mastectomy w/ nodes partial Right 1997  . Mastectomy complete / simple Right 07/28/2013  . Appendectomy  1997  . Breast biopsy Right 2014  . Cystoscopy with ureteroscopy, stone basketry and stent placement Bilateral 2014  . Lithotripsy Left ~ 06/2013  . Simple mastectomy with axillary sentinel node biopsy Right 07/28/2013    Procedure: RIGHT TOTAL  MASTECTOMY;  Surgeon: Imogene Burn. Georgette Dover, MD;  Location: [place];  Service: General;  Laterality: Right;  . Portacath placement Left 07/28/2013    Procedure: ATTEMPTED INSERTION PORT-A-CATH;  Surgeon: Imogene Burn. Georgette Dover, MD;  Location: [place] [facility];  Service: General;  Laterality: Left;    FAMILY HISTORY Family History  Problem Relation Age of Onset  . Heart disease Maternal Grandfather   . Diabetes Maternal Grandfather   . Colon cancer Maternal Aunt 79  . Brain cancer Paternal Grandmother     dx in 64s  .  Dementia Mother   . Diabetes Mother   . Osteoporosis Mother   . Diabetes Maternal Aunt   . Prostate cancer Father 79  . Bipolar disorder Maternal Aunt   . Stroke Maternal Aunt   . Stomach cancer Paternal Uncle     dx in late 51s   the patient's mother died at the age of 44. The patient's father is alive at age 4. She had no brothers or sisters. Her father has a history of prostate cancer. There is no history of breast or ovarian cancer in the family. One maternal first cousin has a history of Hodgkin's lymphoma.  GYNECOLOGIC HISTORY:  Menarche age 76, first live birth age 54. She is GX P1. She underwent total abdominal hysterectomy and bilateral salpingo-oophorectomy in 1997 she did not take hormone replacement.  SOCIAL HISTORY: (Updated  11/15/2013)  She is  a homemaker. Her husband Arnette Norris farms approximately 500 acres. Daughter Colletta [place] lives in [place] where she works as a Pension scheme manager for a drug company. The patient has no grandchildren. She has two "grand cats". She attends a local united church of [place]: Not in place   HEALTH MAINTENANCE:  (Updated   11/15/2013 ) History  Substance Use Topics  . Smoking status: Never Smoker   . Smokeless tobacco: Never Used  . Alcohol Use: Yes     Comment: 07/28/2013 "no alcohol since 1994; never had problem w/it"     Colonoscopy: 2009/ [facility]  PAP: Status post hysterectomy  Bone density: May 10/14/2009 at [facility] was normal  Lipid panel:  Not on file   Allergies  Allergen Reactions  . Amoxicillin Itching  . Lasix [Furosemide] Nausea And Vomiting and Other (See Comments)    headache  . Lithium Other (See Comments)    Dizziness, "cause me to fall"  . Sulfa Antibiotics Hives  . Trazodone And Nefazodone Other (See Comments)    insomnia  . Lyrica [Pregabalin] Diarrhea, Nausea Only and Rash    Current Outpatient Prescriptions  Medication Sig Dispense Refill  . ALPRAZolam (XANAX) 0.5  MG tablet TAKE 1 TABLET EVERY 8 HOURS AS NEEDED  30 tablet  0  . amitriptyline (ELAVIL) 150 MG tablet Take 150 mg by mouth at bedtime.      . Ascorbic Acid (VITAMIN C) 1000 MG tablet Take 1,000 mg by mouth daily.      Marland Kitchen buPROPion (WELLBUTRIN SR) 150 MG 12 hr tablet Take 150-300 mg by mouth 2 (two) times daily. Take 340m in the morning and 1593mat 2pm      . cetirizine (ZYRTEC) 10 MG tablet TAKE ONE TABLET BY MOUTH DAILY AS NEEDED  30 tablet  4  . Cholecalciferol (VITAMIN D-3) 1000 UNITS CAPS Take 5,000 Units by mouth daily.      . cholestyramine (QUESTRAN) 4 G packet Take 1 packet (4 g total) by mouth 2 (two) times daily as needed (diarrhea). Take with meals.  40 each  1  . ibuprofen (ADVIL,MOTRIN) 200 MG tablet Take 800 mg by mouth every 6 (six) hours as needed for moderate pain.      . Marland Kitchenevothyroxine (SYNTHROID, LEVOTHROID) 50 MCG tablet Take 50 mcg by mouth daily before breakfast.      . lidocaine-prilocaine (EMLA) cream Apply 1 application topically as needed.  30 g  3  . metoprolol succinate (TOPROL-XL) 25 MG 24 hr tablet Take 25 mg by mouth daily.      . minoxidil (ROGAINE) 2 % external solution Apply 1 application topically 2 (two) times daily as needed.       . Multiple Vitamins-Minerals (HAIR VITAMINS PO) Take 1 tablet by mouth 2 (two) times daily.      . Omega-3 Fatty Acids (FISH OIL PO) Take 1 capsule by mouth daily.       . Marland Kitchenmeprazole (PRILOSEC) 40 MG capsule Take 1 capsule (40 mg total) by mouth every morning.  30 capsule  3  . ondansetron (ZOFRAN) 8 MG tablet 1 tab by mouth at dinner on day of chemo, then 1 tab with breakfast and dinner on day after chemo.  Then 1 tab every 12 hrs PRN nausea  30 tablet  1  . Oxcarbazepine (TRILEPTAL) 300 MG tablet Take 300 mg by mouth 2 (two) times daily.      . Marland KitchenxyCODONE-acetaminophen (PERCOCET/ROXICET) 5-325 MG per tablet Take 1 tablet by  mouth every 4 (four) hours as needed for moderate pain.  40 tablet  0  . potassium chloride (MICRO-K) 10 MEQ CR  capsule 2 tabs by mouth in the morning and 1 tab by mouth in the evening  90 capsule  3  . prochlorperazine (COMPAZINE) 10 MG tablet Take 1 tablet (10 mg total) by mouth 4 (four) times daily -  before meals and at bedtime. Starting at supper day before treatment, then evening of treatment and continue thru next day, then PRN  30 tablet  1  . Vilazodone HCl (VIIBRYD) 20 MG TABS Take 1 tablet by mouth daily.      . vitamin B-12 (CYANOCOBALAMIN) 1000 MCG tablet Take 1,000 mcg by mouth daily.       No current facility-administered medications for this visit.    OBJECTIVE: 68 year old white woman in no acute distress Filed Vitals:   01/03/14 0825  BP: 114/77  Pulse: 91  Temp: 98.5 F (36.9 C)  Resp: 18     Body mass index is 31.05 kg/(m^2).    ECOG FS:1 - Symptomatic but completely ambulatory Filed Weights   01/03/14 0825  Weight: 181 lb (82.101 kg)   Sclerae unicteric, EOMs intact Oropharynx clear and moist No cervical or supraclavicular adenopathy, no axillary or inguinal adenopathy Lungs no rales or rhonchi Heart regular rate and rhythm Abd soft, nontender, positive bowel sounds MSK no focal spinal tenderness, no upper extremity lymphedema Neuro: nonfocal, well oriented, anxious affect Breasts: The right breast is status post mastectomy. There is no evidence of local recurrence. The right axilla is benign. Left breast is unremarkable.  LAB RESULTS:   Lab Results  Component Value Date   WBC 13.2* 01/03/2014   NEUTROABS 4.7 01/03/2014   HGB 11.0* 01/03/2014   HCT 34.8 01/03/2014   MCV 91.1 01/03/2014   PLT 217 01/03/2014      Chemistry      Component Value Date/Time   NA 141 12/13/2013 0952   NA 145 07/30/2013 0422   K 3.9 12/13/2013 0952   K 3.9 07/30/2013 0422   CL 111 07/30/2013 0422   CO2 19* 12/13/2013 0952   CO2 25 07/30/2013 0422   BUN 11.6 12/13/2013 0952   BUN 6 07/30/2013 0422   CREATININE 0.8 12/13/2013 0952   CREATININE 0.76 07/30/2013 0422      Component Value  Date/Time   CALCIUM 9.6 12/13/2013 0952   CALCIUM 8.3* 07/30/2013 0422   ALKPHOS 94 12/13/2013 0952   ALKPHOS 196* 06/04/2013 2221   AST 19 12/13/2013 0952   AST 30 06/04/2013 2221   ALT 27 12/13/2013 0952   ALT 45* 06/04/2013 2221   BILITOT <0.20 12/13/2013 0952   BILITOT 0.3 06/04/2013 2221      STUDIES:  An echocardiogram on 11/09/2013 showed an ejection fraction of 55-60%.   B[facility] density scan at [facility] 12/15/2013 was normal, with a lowest T. value at the right femoral neck being -1.0   ASSESSMENT: 68 y.o. BRCA negative [place] woman  (1) status post right lumpectomy and axillary lymph node dissection in 1997 for a stage I breast cancer, treated with adjuvant radiation and tamoxifen for 5 years  (2) status post right breast biopsy 07/13/2013 for a clinical T1c N0, stage IA invasive ductal carcinoma, grade 2, estrogen receptor 100% positive, progesterone receptor 13% positive, with an MIB-1 of 33%, and HER-2 amplification by CISH with a HER2/CEP 17 ratio of 3.19, and an average HER-2 copy number per cell of 4.15  (  3) status post right mastectomy 07/28/2013 for a pT1c pN0, stage IA invasive ductal carcinoma, grade 3, with close but negative margins. Prognostic panel was not repeated  (4) completed weekly paclitaxel x12 12/06/2913, with trastuzumab/ pertuzumab every 3 weeks; pertuzumab was held with third dose on 11/01/2013 due to diarrhea, tried a half dose on cycle 4 again with diarrhea developing  (5) trastuzumab (started 09/20/2013) to be continued for 1 year; most recent echocardiogram on 11/09/2013 shows a well preserved ejection fraction  (6) anastrozole started May 2015; bone density 12/15/2013 normal   OTHER PROBLEMS:  (a) History of chronic lymphoid leukemia diagnosed by flow cytometry 06/29/2013, the cells being CD5, CD20 and CD23 positive, CD10 negative.   (b) Anemia with a normal MCV and normal ferritin-- B-12 and folate pending  (c) the patient met with Dr.  Harlow Mares and has decided against reconstruction   PLAN: Analeise is tolerating the trastuzumab without any side effects that she is aware of. She will be due for repeat echocardiogram in June. The plan is to continue that drug until she completes her year.  Today we are starting anastrozole. She understands she may develop some hot flashes from this. Other symptoms such as vaginal dryness and arthralgias/myalgias may develop more slowly. She will call with any problems. If she tolerates it well the plan will be to do that for 5 years. With metastatic will be to further cut in half a risk of recurrence and also cutting half her risk of developing a new breast cancer in the contralateral breast.  She will need a repeat echocardiogram in June. She will see [place] again in July.  Joshlyn has a good understanding of the overall plan. She agrees with it. She knows the goal of treatment in her case is cure. She will call with any problems that may develop before next visit here.  Chauncey Cruel, MD   Medical oncology  01/03/2014 8:56 AM

## 2014-01-03 NOTE — Telephone Encounter (Signed)
per pof to sch appts-sch & gave pt copy of sch

## 2014-01-05 ENCOUNTER — Telehealth: Payer: Self-pay | Admitting: Radiology

## 2014-01-05 NOTE — Telephone Encounter (Signed)
Patients bone density low normal range, will call patient to advise, she needs follow up in 2 years. Sent for scanning.

## 2014-01-06 ENCOUNTER — Other Ambulatory Visit: Payer: Self-pay | Admitting: Oncology

## 2014-01-10 ENCOUNTER — Other Ambulatory Visit: Payer: Self-pay | Admitting: *Deleted

## 2014-01-10 DIAGNOSIS — C50919 Malignant neoplasm of unspecified site of unspecified female breast: Secondary | ICD-10-CM

## 2014-01-10 MED ORDER — ANASTROZOLE 1 MG PO TABS: 1.0000 mg | ORAL_TABLET | Freq: Every day | ORAL | Status: DC

## 2014-01-11 ENCOUNTER — Other Ambulatory Visit: Payer: Self-pay | Admitting: Oncology

## 2014-01-12 ENCOUNTER — Other Ambulatory Visit: Payer: Self-pay | Admitting: *Deleted

## 2014-01-12 ENCOUNTER — Other Ambulatory Visit (HOSPITAL_BASED_OUTPATIENT_CLINIC_OR_DEPARTMENT_OTHER): Payer: Medicare Other

## 2014-01-12 ENCOUNTER — Ambulatory Visit (HOSPITAL_BASED_OUTPATIENT_CLINIC_OR_DEPARTMENT_OTHER): Payer: Medicare Other

## 2014-01-12 VITALS — BP 114/69 | HR 89 | Temp 97.3°F | Resp 18

## 2014-01-12 DIAGNOSIS — C50519 Malignant neoplasm of lower-outer quadrant of unspecified female breast: Secondary | ICD-10-CM

## 2014-01-12 DIAGNOSIS — C50919 Malignant neoplasm of unspecified site of unspecified female breast: Secondary | ICD-10-CM

## 2014-01-12 DIAGNOSIS — Z5112 Encounter for antineoplastic immunotherapy: Secondary | ICD-10-CM

## 2014-01-12 DIAGNOSIS — R197 Diarrhea, unspecified: Secondary | ICD-10-CM

## 2014-01-12 DIAGNOSIS — C50511 Malignant neoplasm of lower-outer quadrant of right female breast: Secondary | ICD-10-CM

## 2014-01-12 LAB — COMPREHENSIVE METABOLIC PANEL (CC13)
ALT: 22 U/L (ref 0–55)
AST: 21 U/L (ref 5–34)
Albumin: 4 g/dL (ref 3.5–5.0)
Alkaline Phosphatase: 98 U/L (ref 40–150)
Anion Gap: 12 mEq/L — ABNORMAL HIGH (ref 3–11)
BILIRUBIN TOTAL: 0.28 mg/dL (ref 0.20–1.20)
BUN: 14.5 mg/dL (ref 7.0–26.0)
CO2: 23 mEq/L (ref 22–29)
CREATININE: 0.8 mg/dL (ref 0.6–1.1)
Calcium: 9.4 mg/dL (ref 8.4–10.4)
Chloride: 108 mEq/L (ref 98–109)
GLUCOSE: 106 mg/dL (ref 70–140)
Potassium: 3.8 mEq/L (ref 3.5–5.1)
SODIUM: 143 meq/L (ref 136–145)
TOTAL PROTEIN: 6.6 g/dL (ref 6.4–8.3)

## 2014-01-12 LAB — MAGNESIUM (CC13): Magnesium: 2.2 mg/dl (ref 1.5–2.5)

## 2014-01-12 LAB — CBC WITH DIFFERENTIAL/PLATELET
BASO%: 0.3 % (ref 0.0–2.0)
Basophils Absolute: 0 10*3/uL (ref 0.0–0.1)
EOS%: 2.4 % (ref 0.0–7.0)
Eosinophils Absolute: 0.2 10*3/uL (ref 0.0–0.5)
HEMATOCRIT: 35.3 % (ref 34.8–46.6)
HGB: 11.1 g/dL — ABNORMAL LOW (ref 11.6–15.9)
LYMPH%: 51.1 % — ABNORMAL HIGH (ref 14.0–49.7)
MCH: 28.5 pg (ref 25.1–34.0)
MCHC: 31.4 g/dL — AB (ref 31.5–36.0)
MCV: 90.7 fL (ref 79.5–101.0)
MONO#: 0.8 10*3/uL (ref 0.1–0.9)
MONO%: 8.2 % (ref 0.0–14.0)
NEUT#: 3.6 10*3/uL (ref 1.5–6.5)
NEUT%: 38 % — AB (ref 38.4–76.8)
PLATELETS: 204 10*3/uL (ref 145–400)
RBC: 3.89 10*6/uL (ref 3.70–5.45)
RDW: 14.9 % — ABNORMAL HIGH (ref 11.2–14.5)
WBC: 9.5 10*3/uL (ref 3.9–10.3)
lymph#: 4.9 10*3/uL — ABNORMAL HIGH (ref 0.9–3.3)

## 2014-01-12 LAB — TECHNOLOGIST REVIEW

## 2014-01-12 MED ORDER — TRASTUZUMAB CHEMO INJECTION 440 MG
6.0000 mg/kg | Freq: Once | INTRAVENOUS | Status: AC
  Administered 2014-01-12: 483 mg via INTRAVENOUS
  Filled 2014-01-12: qty 23

## 2014-01-12 MED ORDER — ACETAMINOPHEN 325 MG PO TABS
ORAL_TABLET | ORAL | Status: AC
  Filled 2014-01-12: qty 2

## 2014-01-12 MED ORDER — ACETAMINOPHEN 325 MG PO TABS
650.0000 mg | ORAL_TABLET | Freq: Once | ORAL | Status: AC
  Administered 2014-01-12: 650 mg via ORAL

## 2014-01-12 MED ORDER — SODIUM CHLORIDE 0.9 % IV SOLN
Freq: Once | INTRAVENOUS | Status: AC
  Administered 2014-01-12: 11:00:00 via INTRAVENOUS

## 2014-01-12 MED ORDER — SODIUM CHLORIDE 0.9 % IJ SOLN
10.0000 mL | INTRAMUSCULAR | Status: DC | PRN
  Administered 2014-01-12: 10 mL
  Filled 2014-01-12: qty 10

## 2014-01-12 MED ORDER — HEPARIN SOD (PORK) LOCK FLUSH 100 UNIT/ML IV SOLN
500.0000 [IU] | Freq: Once | INTRAVENOUS | Status: AC | PRN
  Administered 2014-01-12: 500 [IU]
  Filled 2014-01-12: qty 5

## 2014-01-12 NOTE — Patient Instructions (Signed)
[facility] Discharge Instructions for Patients Receiving Chemotherapy  Today you received the following chemotherapy agents Herceptin.  To help prevent nausea and vomiting after your treatment, we encourage you to take your nausea medication as directed.   If you develop nausea and vomiting that is not controlled by your nausea medication, call the clinic.   BELOW ARE SYMPTOMS THAT SHOULD BE REPORTED IMMEDIATELY:  *FEVER GREATER THAN 100.5 F  *CHILLS WITH OR WITHOUT FEVER  NAUSEA AND VOMITING THAT IS NOT CONTROLLED WITH YOUR NAUSEA MEDICATION  *UNUSUAL SHORTNESS OF BREATH  *UNUSUAL BRUISING OR BLEEDING  TENDERNESS IN MOUTH AND THROAT WITH OR WITHOUT PRESENCE OF ULCERS  *URINARY PROBLEMS  *BOWEL PROBLEMS  UNUSUAL RASH Items with * indicate a potential emergency and should be followed up as soon as possible.  Feel free to call the clinic you have any questions or concerns. The clinic phone number is [phone].  

## 2014-01-23 ENCOUNTER — Other Ambulatory Visit: Payer: Self-pay | Admitting: *Deleted

## 2014-01-23 MED ORDER — METOPROLOL SUCCINATE ER 25 MG PO TB24: 25.0000 mg | ORAL_TABLET | Freq: Every day | ORAL | Status: DC

## 2014-02-01 ENCOUNTER — Other Ambulatory Visit: Payer: Self-pay | Admitting: Physician Assistant

## 2014-02-01 DIAGNOSIS — C50919 Malignant neoplasm of unspecified site of unspecified female breast: Secondary | ICD-10-CM

## 2014-02-02 ENCOUNTER — Other Ambulatory Visit (HOSPITAL_BASED_OUTPATIENT_CLINIC_OR_DEPARTMENT_OTHER): Payer: Medicare Other

## 2014-02-02 ENCOUNTER — Ambulatory Visit (HOSPITAL_BASED_OUTPATIENT_CLINIC_OR_DEPARTMENT_OTHER): Payer: Medicare Other

## 2014-02-02 ENCOUNTER — Other Ambulatory Visit: Payer: Self-pay | Admitting: Oncology

## 2014-02-02 VITALS — BP 132/60 | HR 88 | Temp 97.5°F

## 2014-02-02 DIAGNOSIS — C50519 Malignant neoplasm of lower-outer quadrant of unspecified female breast: Secondary | ICD-10-CM

## 2014-02-02 DIAGNOSIS — D649 Anemia, unspecified: Secondary | ICD-10-CM

## 2014-02-02 DIAGNOSIS — C50511 Malignant neoplasm of lower-outer quadrant of right female breast: Secondary | ICD-10-CM

## 2014-02-02 DIAGNOSIS — C50919 Malignant neoplasm of unspecified site of unspecified female breast: Secondary | ICD-10-CM

## 2014-02-02 DIAGNOSIS — Z5112 Encounter for antineoplastic immunotherapy: Secondary | ICD-10-CM

## 2014-02-02 DIAGNOSIS — C9110 Chronic lymphocytic leukemia of B-cell type not having achieved remission: Secondary | ICD-10-CM

## 2014-02-02 DIAGNOSIS — R197 Diarrhea, unspecified: Secondary | ICD-10-CM

## 2014-02-02 LAB — CBC WITH DIFFERENTIAL/PLATELET
BASO%: 0.8 % (ref 0.0–2.0)
Basophils Absolute: 0.1 10*3/uL (ref 0.0–0.1)
EOS%: 2.2 % (ref 0.0–7.0)
Eosinophils Absolute: 0.2 10*3/uL (ref 0.0–0.5)
HCT: 34.5 % — ABNORMAL LOW (ref 34.8–46.6)
HGB: 11.2 g/dL — ABNORMAL LOW (ref 11.6–15.9)
LYMPH#: 4.6 10*3/uL — AB (ref 0.9–3.3)
LYMPH%: 47.9 % (ref 14.0–49.7)
MCH: 29 pg (ref 25.1–34.0)
MCHC: 32.5 g/dL (ref 31.5–36.0)
MCV: 89.1 fL (ref 79.5–101.0)
MONO#: 0.6 10*3/uL (ref 0.1–0.9)
MONO%: 5.9 % (ref 0.0–14.0)
NEUT#: 4.2 10*3/uL (ref 1.5–6.5)
NEUT%: 43.2 % (ref 38.4–76.8)
Platelets: 210 10*3/uL (ref 145–400)
RBC: 3.87 10*6/uL (ref 3.70–5.45)
RDW: 13.5 % (ref 11.2–14.5)
WBC: 9.6 10*3/uL (ref 3.9–10.3)

## 2014-02-02 LAB — MAGNESIUM (CC13): MAGNESIUM: 2 mg/dL (ref 1.5–2.5)

## 2014-02-02 MED ORDER — TRASTUZUMAB CHEMO INJECTION 440 MG
6.0000 mg/kg | Freq: Once | INTRAVENOUS | Status: AC
  Administered 2014-02-02: 483 mg via INTRAVENOUS
  Filled 2014-02-02: qty 23

## 2014-02-02 MED ORDER — SODIUM CHLORIDE 0.9 % IJ SOLN
10.0000 mL | INTRAMUSCULAR | Status: DC | PRN
Start: 2014-02-02 — End: 2014-02-02
  Administered 2014-02-02: 10 mL
  Filled 2014-02-02: qty 10

## 2014-02-02 MED ORDER — ACETAMINOPHEN 325 MG PO TABS
ORAL_TABLET | ORAL | Status: AC
  Filled 2014-02-02: qty 2

## 2014-02-02 MED ORDER — SODIUM CHLORIDE 0.9 % IV SOLN
Freq: Once | INTRAVENOUS | Status: AC
  Administered 2014-02-02: 11:00:00 via INTRAVENOUS

## 2014-02-02 MED ORDER — HEPARIN SOD (PORK) LOCK FLUSH 100 UNIT/ML IV SOLN
500.0000 [IU] | Freq: Once | INTRAVENOUS | Status: AC | PRN
  Administered 2014-02-02: 500 [IU]
  Filled 2014-02-02: qty 5

## 2014-02-02 MED ORDER — ACETAMINOPHEN 325 MG PO TABS
650.0000 mg | ORAL_TABLET | Freq: Once | ORAL | Status: AC
  Administered 2014-02-02: 650 mg via ORAL

## 2014-02-02 NOTE — Patient Instructions (Signed)
[facility] Discharge Instructions for Patients Receiving Chemotherapy  Today you received the following chemotherapy agents herceptin   To help prevent nausea and vomiting after your treatment, we encourage you to take your nausea medication as directed   If you develop nausea and vomiting that is not controlled by your nausea medication, call the clinic.   BELOW ARE SYMPTOMS THAT SHOULD BE REPORTED IMMEDIATELY:  *FEVER GREATER THAN 100.5 F  *CHILLS WITH OR WITHOUT FEVER  NAUSEA AND VOMITING THAT IS NOT CONTROLLED WITH YOUR NAUSEA MEDICATION  *UNUSUAL SHORTNESS OF BREATH  *UNUSUAL BRUISING OR BLEEDING  TENDERNESS IN MOUTH AND THROAT WITH OR WITHOUT PRESENCE OF ULCERS  *URINARY PROBLEMS  *BOWEL PROBLEMS  UNUSUAL RASH Items with * indicate a potential emergency and should be followed up as soon as possible.  Feel free to call the clinic you have any questions or concerns. The clinic phone number is [phone].  

## 2014-02-07 ENCOUNTER — Telehealth: Payer: Self-pay | Admitting: Oncology

## 2014-02-07 NOTE — Telephone Encounter (Signed)
, °

## 2014-02-08 ENCOUNTER — Encounter: Payer: Self-pay | Admitting: Oncology

## 2014-02-08 ENCOUNTER — Telehealth: Payer: Self-pay | Admitting: Oncology

## 2014-02-08 NOTE — Progress Notes (Signed)
Insurance is paying perjecta-[id number] and herceptin-[id number]   100%

## 2014-02-08 NOTE — Telephone Encounter (Signed)
, °

## 2014-02-09 ENCOUNTER — Other Ambulatory Visit: Payer: Self-pay | Admitting: Physician Assistant

## 2014-02-09 ENCOUNTER — Ambulatory Visit: Payer: Medicare Other | Admitting: Emergency Medicine

## 2014-02-09 DIAGNOSIS — C50511 Malignant neoplasm of lower-outer quadrant of right female breast: Secondary | ICD-10-CM

## 2014-02-09 DIAGNOSIS — C50911 Malignant neoplasm of unspecified site of right female breast: Secondary | ICD-10-CM

## 2014-02-09 DIAGNOSIS — C9110 Chronic lymphocytic leukemia of B-cell type not having achieved remission: Secondary | ICD-10-CM

## 2014-02-10 ENCOUNTER — Ambulatory Visit (HOSPITAL_COMMUNITY): Admission: RE | Admit: 2014-02-10 | Payer: Medicare Other | Source: Ambulatory Visit

## 2014-02-14 ENCOUNTER — Other Ambulatory Visit: Payer: Self-pay | Admitting: Physician Assistant

## 2014-02-14 ENCOUNTER — Encounter: Payer: Self-pay | Admitting: Emergency Medicine

## 2014-02-14 ENCOUNTER — Ambulatory Visit (INDEPENDENT_AMBULATORY_CARE_PROVIDER_SITE_OTHER): Payer: Medicare Other | Admitting: Emergency Medicine

## 2014-02-14 VITALS — BP 127/72 | HR 85 | Temp 98.3°F | Resp 16 | Ht 64.0 in | Wt 181.4 lb

## 2014-02-14 DIAGNOSIS — R197 Diarrhea, unspecified: Secondary | ICD-10-CM

## 2014-02-14 NOTE — Progress Notes (Signed)
   Subjective:    Patient ID: [name] [name], female    DOB: [birth year], 68 y.o.   MRN: [id number]  HPI Patient has recently been under treatmetn for recurrent breast cancer, chronic lymphocytic leukemia, and kidney stones. Completed chemotherapy for her breast cancer in April. She indicates the chemotherapy caused terrible diarrhea,which continues.Patient states she did have some intermittent problems with diarrhea prior to the chemotherapy also. States at times she did not make it to the bathroom when she had the urge to go. She is currently on Herceptin and Arimidex following the chemotherapy. She is agreeable to having a colonoscopy. She also is having some left lower quadrant pains, states this is intermittent also. Dr Jana Hakim is her oncologist, patient has had recent visit to consider reconstructive surgery but has not decided if she wants to proceed.   Review of Systems     Objective:   Physical Exam patient is alert and cooperative. Neck supple chest clear abdomen soft nontender         Assessment & Plan:  Stool cultures Ambulatory referral to Gastroenterology, Dr Ardis Hughs at [place] US Abdomen to evaluate for possible gall stones The patient states she has had diarrhea for 5 years. She states she's had a previous colonoscopy but it is usually ago. When I saw her in the fall of last year she was diagnosed with kidney stones. Following this she was found to have chronic lymphocytic leukemia which was followed by recurrence of her breast cancer in the right breast requiring mastectomy. She is currently on chemotherapy for this which may be contributing to her diarrhea. We'll go ahead and do routine stool culture, stool for O&P, lactoferrin, and  C. difficile. Appointment made with GI for their help.

## 2014-02-17 ENCOUNTER — Telehealth: Payer: Self-pay | Admitting: *Deleted

## 2014-02-17 NOTE — Telephone Encounter (Signed)
Attempted to call patient x2 to inform her of Amy Berry's departure and get her rescheduled.  Left voicemail for a return phone call.

## 2014-02-18 ENCOUNTER — Other Ambulatory Visit: Payer: Self-pay | Admitting: Emergency Medicine

## 2014-02-18 LAB — IFOBT (OCCULT BLOOD): IFOBT: POSITIVE

## 2014-02-19 LAB — CLOSTRIDIUM DIFFICILE BY PCR: CDIFFPCR: NOT DETECTED

## 2014-02-19 LAB — FECAL LACTOFERRIN, QUANT: LACTOFERRIN: NEGATIVE

## 2014-02-20 ENCOUNTER — Telehealth: Payer: Self-pay | Admitting: Oncology

## 2014-02-20 ENCOUNTER — Encounter (INDEPENDENT_AMBULATORY_CARE_PROVIDER_SITE_OTHER): Payer: Self-pay

## 2014-02-20 ENCOUNTER — Telehealth: Payer: Self-pay | Admitting: *Deleted

## 2014-02-20 ENCOUNTER — Ambulatory Visit
Admission: RE | Admit: 2014-02-20 | Discharge: 2014-02-20 | Disposition: A | Discharge status: Home / Self Care | Payer: Medicare Other | Source: Ambulatory Visit | Attending: Emergency Medicine | Admitting: Emergency Medicine

## 2014-02-20 DIAGNOSIS — R197 Diarrhea, unspecified: Secondary | ICD-10-CM

## 2014-02-20 LAB — CRYPTOSPORIDIUM ANTIGEN, STOOL: CRYPTOSPORIDIUM SCREEN (EIA) (SOL): NEGATIVE

## 2014-02-20 LAB — OVA AND PARASITE EXAMINATION: OP: NONE SEEN

## 2014-02-20 NOTE — Telephone Encounter (Signed)
m °

## 2014-02-20 NOTE — Telephone Encounter (Signed)
Spoke with patient who states she had not received any of my messages concerning her appointment.  Informed her of Amy Berry's departure and confirmed appointment times.  She knows she will only get lab and herceptin on 02/23/14. Labs at 1115 at herceptin at 1145.  Also reminded her we need to reschedule her appointment for her echo that she was a no show.  She states she did not know about this appointment either.  Informed her someone would be calling her to reschedule this before her appointment on 03/16/14.  Also informed her that she would see Charlestine Massed. NP on 03/16/14 to discuss echo results and get herceptin.  Confirmed lab appointment for 915, Lindsey at 945 with herceptin to follow.  She voices understanding.

## 2014-02-20 NOTE — Telephone Encounter (Signed)
Per RN navg. Adjusted 7/23 appt

## 2014-02-22 LAB — STOOL CULTURE

## 2014-02-22 LAB — GIARDIA ANTIGEN: GIARDIA SCREEN (EIA): NEGATIVE

## 2014-02-23 ENCOUNTER — Ambulatory Visit (HOSPITAL_BASED_OUTPATIENT_CLINIC_OR_DEPARTMENT_OTHER): Payer: Medicare Other

## 2014-02-23 ENCOUNTER — Ambulatory Visit: Payer: Medicare Other | Admitting: Physician Assistant

## 2014-02-23 ENCOUNTER — Other Ambulatory Visit (HOSPITAL_BASED_OUTPATIENT_CLINIC_OR_DEPARTMENT_OTHER): Payer: Medicare Other

## 2014-02-23 ENCOUNTER — Ambulatory Visit (HOSPITAL_COMMUNITY): Admission: RE | Admit: 2014-02-23 | Payer: Medicare Other | Source: Ambulatory Visit

## 2014-02-23 VITALS — BP 134/67 | HR 87 | Temp 98.2°F | Resp 18

## 2014-02-23 DIAGNOSIS — C50911 Malignant neoplasm of unspecified site of right female breast: Secondary | ICD-10-CM

## 2014-02-23 DIAGNOSIS — C9110 Chronic lymphocytic leukemia of B-cell type not having achieved remission: Secondary | ICD-10-CM

## 2014-02-23 DIAGNOSIS — C50519 Malignant neoplasm of lower-outer quadrant of unspecified female breast: Secondary | ICD-10-CM

## 2014-02-23 DIAGNOSIS — Z5112 Encounter for antineoplastic immunotherapy: Secondary | ICD-10-CM

## 2014-02-23 DIAGNOSIS — C50919 Malignant neoplasm of unspecified site of unspecified female breast: Secondary | ICD-10-CM

## 2014-02-23 DIAGNOSIS — C50511 Malignant neoplasm of lower-outer quadrant of right female breast: Secondary | ICD-10-CM

## 2014-02-23 LAB — CBC WITH DIFFERENTIAL/PLATELET
BASO%: 0.3 % (ref 0.0–2.0)
Basophils Absolute: 0 10*3/uL (ref 0.0–0.1)
EOS%: 2.6 % (ref 0.0–7.0)
Eosinophils Absolute: 0.3 10*3/uL (ref 0.0–0.5)
HCT: 37.3 % (ref 34.8–46.6)
HGB: 11.6 g/dL (ref 11.6–15.9)
LYMPH%: 51.4 % — ABNORMAL HIGH (ref 14.0–49.7)
MCH: 27.7 pg (ref 25.1–34.0)
MCHC: 31.1 g/dL — ABNORMAL LOW (ref 31.5–36.0)
MCV: 89 fL (ref 79.5–101.0)
MONO#: 1 10*3/uL — ABNORMAL HIGH (ref 0.1–0.9)
MONO%: 8.9 % (ref 0.0–14.0)
NEUT#: 4.3 10*3/uL (ref 1.5–6.5)
NEUT%: 36.8 % — ABNORMAL LOW (ref 38.4–76.8)
Platelets: 260 10*3/uL (ref 145–400)
RBC: 4.19 10*6/uL (ref 3.70–5.45)
RDW: 13 % (ref 11.2–14.5)
WBC: 11.6 10*3/uL — ABNORMAL HIGH (ref 3.9–10.3)
lymph#: 6 10*3/uL — ABNORMAL HIGH (ref 0.9–3.3)

## 2014-02-23 LAB — COMPREHENSIVE METABOLIC PANEL (CC13)
ALBUMIN: 4.1 g/dL (ref 3.5–5.0)
ALK PHOS: 101 U/L (ref 40–150)
ALT: 50 U/L (ref 0–55)
AST: 29 U/L (ref 5–34)
Anion Gap: 11 mEq/L (ref 3–11)
BUN: 19 mg/dL (ref 7.0–26.0)
CALCIUM: 9.6 mg/dL (ref 8.4–10.4)
CO2: 25 meq/L (ref 22–29)
Chloride: 106 mEq/L (ref 98–109)
Creatinine: 1.1 mg/dL (ref 0.6–1.1)
Glucose: 109 mg/dl (ref 70–140)
Potassium: 4.9 mEq/L (ref 3.5–5.1)
SODIUM: 143 meq/L (ref 136–145)
TOTAL PROTEIN: 6.8 g/dL (ref 6.4–8.3)
Total Bilirubin: 0.22 mg/dL (ref 0.20–1.20)

## 2014-02-23 LAB — MAGNESIUM (CC13): MAGNESIUM: 2.2 mg/dL (ref 1.5–2.5)

## 2014-02-23 MED ORDER — SODIUM CHLORIDE 0.9 % IJ SOLN
10.0000 mL | INTRAMUSCULAR | Status: DC | PRN
  Administered 2014-02-23: 10 mL
  Filled 2014-02-23: qty 10

## 2014-02-23 MED ORDER — TRASTUZUMAB CHEMO INJECTION 440 MG
6.0000 mg/kg | Freq: Once | INTRAVENOUS | Status: AC
  Administered 2014-02-23: 483 mg via INTRAVENOUS
  Filled 2014-02-23: qty 23

## 2014-02-23 MED ORDER — HEPARIN SOD (PORK) LOCK FLUSH 100 UNIT/ML IV SOLN
500.0000 [IU] | Freq: Once | INTRAVENOUS | Status: AC | PRN
  Administered 2014-02-23: 500 [IU]
  Filled 2014-02-23: qty 5

## 2014-02-23 MED ORDER — ACETAMINOPHEN 325 MG PO TABS
650.0000 mg | ORAL_TABLET | Freq: Once | ORAL | Status: AC
  Administered 2014-02-23: 650 mg via ORAL

## 2014-02-23 MED ORDER — ACETAMINOPHEN 325 MG PO TABS
ORAL_TABLET | ORAL | Status: AC
  Filled 2014-02-23: qty 2

## 2014-02-23 MED ORDER — SODIUM CHLORIDE 0.9 % IV SOLN
Freq: Once | INTRAVENOUS | Status: AC
  Administered 2014-02-23: 12:00:00 via INTRAVENOUS

## 2014-02-23 NOTE — Patient Instructions (Signed)
[facility] Discharge Instructions for Patients Receiving Chemotherapy  Today you received the following chemotherapy agents Herceptin  To help prevent nausea and vomiting after your treatment, we encourage you to take your nausea medication     If you develop nausea and vomiting that is not controlled by your nausea medication, call the clinic.   BELOW ARE SYMPTOMS THAT SHOULD BE REPORTED IMMEDIATELY:  *FEVER GREATER THAN 100.5 F  *CHILLS WITH OR WITHOUT FEVER  NAUSEA AND VOMITING THAT IS NOT CONTROLLED WITH YOUR NAUSEA MEDICATION  *UNUSUAL SHORTNESS OF BREATH  *UNUSUAL BRUISING OR BLEEDING  TENDERNESS IN MOUTH AND THROAT WITH OR WITHOUT PRESENCE OF ULCERS  *URINARY PROBLEMS  *BOWEL PROBLEMS  UNUSUAL RASH Items with * indicate a potential emergency and should be followed up as soon as possible.  Feel free to call the clinic you have any questions or concerns. The clinic phone number is [phone].    

## 2014-02-27 ENCOUNTER — Telehealth: Payer: Self-pay | Admitting: *Deleted

## 2014-02-27 NOTE — Telephone Encounter (Signed)
Called and spoke with patient's husband to inform patient of her appointment for echocardiogram 03/02/14 at 10am.

## 2014-03-02 ENCOUNTER — Ambulatory Visit (HOSPITAL_COMMUNITY)
Admission: RE | Admit: 2014-03-02 | Discharge: 2014-03-02 | Disposition: A | Discharge status: Home / Self Care | Payer: Medicare Other | Source: Ambulatory Visit | Attending: Oncology | Admitting: Oncology

## 2014-03-02 ENCOUNTER — Encounter (INDEPENDENT_AMBULATORY_CARE_PROVIDER_SITE_OTHER): Payer: Medicare Other | Admitting: Family Medicine

## 2014-03-02 DIAGNOSIS — I519 Heart disease, unspecified: Secondary | ICD-10-CM

## 2014-03-02 DIAGNOSIS — C50911 Malignant neoplasm of unspecified site of right female breast: Secondary | ICD-10-CM

## 2014-03-02 DIAGNOSIS — C50919 Malignant neoplasm of unspecified site of unspecified female breast: Secondary | ICD-10-CM

## 2014-03-02 DIAGNOSIS — E785 Hyperlipidemia, unspecified: Secondary | ICD-10-CM | POA: Insufficient documentation

## 2014-03-02 DIAGNOSIS — R0609 Other forms of dyspnea: Secondary | ICD-10-CM | POA: Insufficient documentation

## 2014-03-02 DIAGNOSIS — Z01810 Encounter for preprocedural cardiovascular examination: Secondary | ICD-10-CM | POA: Insufficient documentation

## 2014-03-02 DIAGNOSIS — R0989 Other specified symptoms and signs involving the circulatory and respiratory systems: Secondary | ICD-10-CM | POA: Insufficient documentation

## 2014-03-02 NOTE — Progress Notes (Signed)
[facility] [address], [place] [address] [phone]  Date:  03/02/2014   Name:  [name] [name]   DOB:  1946/03/24   MRN:  [id number]  PCP:  [name], MD    Chief Complaint: Follow-up and Hip Pain   History of Present Illness:  [name] [name] is a 68 y.o. very pleasant female patient who presents with the following:  Pt left not seen  Patient Active Problem List   Diagnosis Date Noted  . Anemia 01/03/2014  . Anemia, unspecified 10/25/2013  . Diarrhea 10/04/2013  . GERD (gastroesophageal reflux disease) 09/27/2013  . Seroma complicating a procedure 08/12/2013  . CLL (chronic lymphocytic leukemia) 07/27/2013  . Breast cancer of lower-outer quadrant of right female breast 07/26/2013  . Nephrolithiasis 06/16/2013  . 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 Chronic pain syndrome 05/10/2013  . H/O long-term treatment with high-risk medication 05/10/2013  . DDD (degenerative disc disease) 05/10/2013  . SOB (shortness of breath)   . Breast cancer - recurrent right   . Sinus tachycardia   . HLD (hyperlipidemia)   . Orthostatic hypotension   . Anxiety   . Arthritis   . Diverticulosis   . Fibromyalgia   . Hypothyroidism     Past Medical History  Diagnosis Date  . Sinus tachycardia     mild resting  . HLD (hyperlipidemia)   . Orthostatic hypotension     slight in the past  . Anxiety   . Depression   . Diverticulosis   . Fibromyalgia 1978  . Hypothyroidism   . Alopecia   . Ruptured disk     one in neck and two in back  . Plantar fasciitis   . GERD (gastroesophageal reflux disease)   . History of syncope     episode 2008--  no recurrence since  . Ureteral calculi     bilateral  . Leukocytosis   . Chronic back pain     "lower back and upper neck" (07/28/2013)  . 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 ear   . Urgency of urination   . Hematuria   . Sensation of pressure in bladder area   . Wears glasses   . Ecchymosis     FROM IV AND LAB WORK THE PAST  WEEK  06-17-2013  . Stool incontinence     "at times recently"   . Kidney stones 2014  . CAP (community acquired pneumonia)     admission 06-04-2013 secondary to failed oral antibitotics---  last cxr 06-16-2013  much improved  . SOB (shortness of breath) 06-17-2013 CURRENTLY RESIDUAL FROM RECENT CAP    SOB from obesity and deconditioning 2009- eval included CPX  . Arthritis     "spine" (07/28/2013)  . DDD (degenerative disc disease)   . CLL (chronic lymphocytic leukemia) dx'd 05/2013    "I don't think I have it though" (07/28/2013)  . Breast cancer 1997; 2014    right    Past Surgical History  Procedure Laterality Date  . Lumbar epidural injection      has had 7 injections  . Total abdominal hysterectomy w/ bilateral salpingoophorectomy  1997  . Breast lumpectomy with axillary lymph node dissection Right 1997  . Transthoracic echocardiogram  05-11-2007  dr Ron Parker    normal lvf/  ef 65%  . Tonsillectomy  AGE 78  . Retinal detachment surgery Right 2013  . Cystoscopy with retrograde pyelogram, ureteroscopy and stent placement Bilateral 06/17/2013    Procedure: CYSTOSCOPY WITH  RETROGRADE PYELOGRAM, URETEROSCOPY AND LEFT DOUBLE  J STENT PLACEMENT RIGHT URETERAL HOLMIIUM LASER AND DOUBLE J STENT ;  Surgeon: Hanley Ben, MD;  Location: [place];  Service: Urology;  Laterality: Bilateral;  . Holmium laser application Bilateral 84/69/6295    Procedure: HOLMIUM LASER APPLICATION;  Surgeon: Hanley Ben, MD;  Location: [place];  Service: Urology;  Laterality: Bilateral;  . Mastectomy w/ nodes partial Right 1997  . Mastectomy complete / simple Right 07/28/2013  . Appendectomy  1997  . Breast biopsy Right 2014  . Cystoscopy with ureteroscopy, stone basketry and stent placement Bilateral 2014  . Lithotripsy Left ~ 06/2013  . Simple mastectomy with axillary sentinel node biopsy Right 07/28/2013    Procedure: RIGHT TOTAL  MASTECTOMY;  Surgeon: Imogene Burn.  Georgette Dover, MD;  Location: [place];  Service: General;  Laterality: Right;  . Portacath placement Left 07/28/2013    Procedure: ATTEMPTED INSERTION PORT-A-CATH;  Surgeon: Imogene Burn. Georgette Dover, MD;  Location: [place];  Service: General;  Laterality: Left;    History  Substance Use Topics  . Smoking status: Never Smoker   . Smokeless tobacco: Never Used  . Alcohol Use: Yes     Comment: 07/28/2013 "no alcohol since 1994; never had problem w/it"    Family History  Problem Relation Age of Onset  . Heart disease Maternal Grandfather   . Diabetes Maternal Grandfather   . Colon cancer Maternal Aunt 79  . Brain cancer Paternal Grandmother     dx in 21s  . Dementia Mother   . Diabetes Mother   . Osteoporosis Mother   . Diabetes Maternal Aunt   . Prostate cancer Father 14  . Bipolar disorder Maternal Aunt   . Stroke Maternal Aunt   . Stomach cancer Paternal Uncle     dx in late 72s    Allergies  Allergen Reactions  . Amoxicillin Itching  . Lasix [Furosemide] Nausea And Vomiting and Other (See Comments)    headache  . Lithium Other (See Comments)    Dizziness, "cause me to fall"  . Sulfa Antibiotics Hives  . Trazodone And Nefazodone Other (See Comments)    insomnia  . Lyrica [Pregabalin] Diarrhea, Nausea Only and Rash    Medication list has been reviewed and updated.  Current Outpatient Prescriptions on File Prior to Visit  Medication Sig Dispense Refill  . ALPRAZolam (XANAX) 0.5 MG tablet TAKE 1 TABLET EVERY 8 HOURS AS NEEDED  30 tablet  0  . amitriptyline (ELAVIL) 150 MG tablet Take 150 mg by mouth at bedtime.      Marland Kitchen anastrozole (ARIMIDEX) 1 MG tablet Take 1 tablet (1 mg total) by mouth daily.  30 tablet  1  . Ascorbic Acid (VITAMIN C) 1000 MG tablet Take 1,000 mg by mouth daily.      Marland Kitchen buPROPion (WELLBUTRIN SR) 150 MG 12 hr tablet Take 150-300 mg by mouth 2 (two) times daily. Take 300mg  in the morning and 150mg  at 2pm      . cetirizine (ZYRTEC) 10 MG tablet TAKE ONE TABLET BY MOUTH DAILY  AS NEEDED  30 tablet  4  . Cholecalciferol (VITAMIN D-3) 1000 UNITS CAPS Take 5,000 Units by mouth daily.      . cholestyramine (QUESTRAN) 4 G packet Take 1 packet (4 g total) by mouth 2 (two) times daily as needed (diarrhea). Take with meals.  40 each  1  . ibuprofen (ADVIL,MOTRIN) 200 MG tablet Take 800 mg by mouth every 6 (six) hours  as needed for moderate pain.      Marland Kitchen levothyroxine (SYNTHROID, LEVOTHROID) 50 MCG tablet Take 50 mcg by mouth daily before breakfast.      . lidocaine-prilocaine (EMLA) cream APPLY 1 APPLICATION TOPICALLY AS NEEDED.  30 g  3  . metoprolol succinate (TOPROL-XL) 25 MG 24 hr tablet Take 1 tablet (25 mg total) by mouth daily.  30 tablet  5  . minoxidil (ROGAINE) 2 % external solution Apply 1 application topically 2 (two) times daily as needed.       . Multiple Vitamins-Minerals (HAIR VITAMINS PO) Take 1 tablet by mouth 2 (two) times daily.      . Omega-3 Fatty Acids (FISH OIL PO) Take 1 capsule by mouth daily.       Marland Kitchen omeprazole (PRILOSEC) 40 MG capsule Take 1 capsule (40 mg total) by mouth every morning.  30 capsule  3  . ondansetron (ZOFRAN) 8 MG tablet 1 tab by mouth at dinner on day of chemo, then 1 tab with breakfast and dinner on day after chemo.  Then 1 tab every 12 hrs PRN nausea  30 tablet  1  . Oxcarbazepine (TRILEPTAL) 300 MG tablet Take 300 mg by mouth 2 (two) times daily.      Marland Kitchen oxyCODONE-acetaminophen (PERCOCET/ROXICET) 5-325 MG per tablet Take 1 tablet by mouth every 4 (four) hours as needed for moderate pain.  40 tablet  0  . potassium chloride (MICRO-K) 10 MEQ CR capsule 2 tabs by mouth in the morning and 1 tab by mouth in the evening  90 capsule  3  . prochlorperazine (COMPAZINE) 10 MG tablet Take 1 tablet (10 mg total) by mouth 4 (four) times daily -  before meals and at bedtime. Starting at supper day before treatment, then evening of treatment and continue thru next day, then PRN  30 tablet  1  . Vilazodone HCl (VIIBRYD) 20 MG TABS Take 1 tablet by  mouth daily.      . vitamin B-12 (CYANOCOBALAMIN) 1000 MCG tablet Take 1,000 mcg by mouth daily.       No current facility-administered medications on file prior to visit.    Review of Systems:    Physical Examination: Filed Vitals:   03/02/14 1255  BP: 116/66  Pulse: 80  Temp: 98.2 F (36.8 C)  Resp: 18   Filed Vitals:   03/02/14 1255  Height: 5\' 5"  (1.651 m)  Weight: 180 lb (81.647 kg)   Body mass index is 29.95 kg/(m^2). Ideal Body Weight: Weight in (lb) to have BMI = 25: 149.9    Assessment and Plan: Pt not seen  Signed [place] Blinks, MD   This encounter was created in error - please disregard.

## 2014-03-02 NOTE — Progress Notes (Signed)
Echocardiogram 2D Echocardiogram has been performed.  [name] [name] 03/02/2014, 10:13 AM

## 2014-03-15 ENCOUNTER — Other Ambulatory Visit: Payer: Self-pay | Admitting: Oncology

## 2014-03-16 ENCOUNTER — Telehealth: Payer: Self-pay | Admitting: Oncology

## 2014-03-16 ENCOUNTER — Encounter: Payer: Self-pay | Admitting: Adult Health

## 2014-03-16 ENCOUNTER — Other Ambulatory Visit: Payer: Self-pay | Admitting: *Deleted

## 2014-03-16 ENCOUNTER — Ambulatory Visit (HOSPITAL_BASED_OUTPATIENT_CLINIC_OR_DEPARTMENT_OTHER): Payer: Medicare Other

## 2014-03-16 ENCOUNTER — Ambulatory Visit (HOSPITAL_BASED_OUTPATIENT_CLINIC_OR_DEPARTMENT_OTHER): Payer: Medicare Other | Admitting: Adult Health

## 2014-03-16 ENCOUNTER — Other Ambulatory Visit (HOSPITAL_BASED_OUTPATIENT_CLINIC_OR_DEPARTMENT_OTHER): Payer: Medicare Other

## 2014-03-16 VITALS — BP 117/56 | HR 81 | Temp 98.3°F | Resp 18 | Ht 65.0 in | Wt 181.1 lb

## 2014-03-16 DIAGNOSIS — C50519 Malignant neoplasm of lower-outer quadrant of unspecified female breast: Secondary | ICD-10-CM

## 2014-03-16 DIAGNOSIS — Z856 Personal history of leukemia: Secondary | ICD-10-CM

## 2014-03-16 DIAGNOSIS — C50511 Malignant neoplasm of lower-outer quadrant of right female breast: Secondary | ICD-10-CM

## 2014-03-16 DIAGNOSIS — M545 Low back pain, unspecified: Secondary | ICD-10-CM

## 2014-03-16 DIAGNOSIS — Z170 Estrogen receptor positive status [ER+]: Secondary | ICD-10-CM

## 2014-03-16 DIAGNOSIS — C50911 Malignant neoplasm of unspecified site of right female breast: Secondary | ICD-10-CM

## 2014-03-16 DIAGNOSIS — C50919 Malignant neoplasm of unspecified site of unspecified female breast: Secondary | ICD-10-CM

## 2014-03-16 DIAGNOSIS — D649 Anemia, unspecified: Secondary | ICD-10-CM

## 2014-03-16 DIAGNOSIS — Z5112 Encounter for antineoplastic immunotherapy: Secondary | ICD-10-CM

## 2014-03-16 DIAGNOSIS — C9110 Chronic lymphocytic leukemia of B-cell type not having achieved remission: Secondary | ICD-10-CM

## 2014-03-16 LAB — CBC WITH DIFFERENTIAL/PLATELET
BASO%: 0.8 % (ref 0.0–2.0)
BASOS ABS: 0.1 10*3/uL (ref 0.0–0.1)
EOS%: 2 % (ref 0.0–7.0)
Eosinophils Absolute: 0.2 10*3/uL (ref 0.0–0.5)
HCT: 35.9 % (ref 34.8–46.6)
HEMOGLOBIN: 11.5 g/dL — AB (ref 11.6–15.9)
LYMPH%: 49.7 % (ref 14.0–49.7)
MCH: 27.2 pg (ref 25.1–34.0)
MCHC: 31.9 g/dL (ref 31.5–36.0)
MCV: 85.4 fL (ref 79.5–101.0)
MONO#: 0.7 10*3/uL (ref 0.1–0.9)
MONO%: 5.9 % (ref 0.0–14.0)
NEUT#: 4.7 10*3/uL (ref 1.5–6.5)
NEUT%: 41.6 % (ref 38.4–76.8)
Platelets: 219 10*3/uL (ref 145–400)
RBC: 4.21 10*6/uL (ref 3.70–5.45)
RDW: 13.5 % (ref 11.2–14.5)
WBC: 11.4 10*3/uL — ABNORMAL HIGH (ref 3.9–10.3)
lymph#: 5.6 10*3/uL — ABNORMAL HIGH (ref 0.9–3.3)

## 2014-03-16 LAB — COMPREHENSIVE METABOLIC PANEL (CC13)
ALT: 35 U/L (ref 0–55)
AST: 25 U/L (ref 5–34)
Albumin: 4 g/dL (ref 3.5–5.0)
Alkaline Phosphatase: 92 U/L (ref 40–150)
Anion Gap: 10 mEq/L (ref 3–11)
BUN: 18.9 mg/dL (ref 7.0–26.0)
CO2: 26 mEq/L (ref 22–29)
CREATININE: 1.3 mg/dL — AB (ref 0.6–1.1)
Calcium: 9.5 mg/dL (ref 8.4–10.4)
Chloride: 110 mEq/L — ABNORMAL HIGH (ref 98–109)
Glucose: 79 mg/dl (ref 70–140)
Potassium: 3.8 mEq/L (ref 3.5–5.1)
Sodium: 146 mEq/L — ABNORMAL HIGH (ref 136–145)
Total Bilirubin: 0.25 mg/dL (ref 0.20–1.20)
Total Protein: 6.7 g/dL (ref 6.4–8.3)

## 2014-03-16 LAB — TECHNOLOGIST REVIEW

## 2014-03-16 LAB — MAGNESIUM (CC13): Magnesium: 2.2 mg/dl (ref 1.5–2.5)

## 2014-03-16 MED ORDER — TRASTUZUMAB CHEMO INJECTION 440 MG
6.0000 mg/kg | Freq: Once | INTRAVENOUS | Status: AC
  Administered 2014-03-16: 483 mg via INTRAVENOUS
  Filled 2014-03-16: qty 23

## 2014-03-16 MED ORDER — ACETAMINOPHEN 325 MG PO TABS
ORAL_TABLET | ORAL | Status: AC
  Filled 2014-03-16: qty 2

## 2014-03-16 MED ORDER — OXYCODONE-ACETAMINOPHEN 5-325 MG PO TABS: 1.0000 | ORAL_TABLET | ORAL | Status: DC | PRN

## 2014-03-16 MED ORDER — SODIUM CHLORIDE 0.9 % IV SOLN
Freq: Once | INTRAVENOUS | Status: AC
  Administered 2014-03-16: 11:00:00 via INTRAVENOUS

## 2014-03-16 MED ORDER — HEPARIN SOD (PORK) LOCK FLUSH 100 UNIT/ML IV SOLN
500.0000 [IU] | Freq: Once | INTRAVENOUS | Status: AC | PRN
  Administered 2014-03-16: 500 [IU]
  Filled 2014-03-16: qty 5

## 2014-03-16 MED ORDER — ACETAMINOPHEN 325 MG PO TABS
650.0000 mg | ORAL_TABLET | Freq: Once | ORAL | Status: AC
  Administered 2014-03-16: 650 mg via ORAL

## 2014-03-16 MED ORDER — SODIUM CHLORIDE 0.9 % IJ SOLN
10.0000 mL | INTRAMUSCULAR | Status: DC | PRN
  Administered 2014-03-16: 10 mL
  Filled 2014-03-16: qty 10

## 2014-03-16 NOTE — Progress Notes (Signed)
ID: [name] [name] OB: 1946-03-08  MR#: [id number]  AYT#:[id number]  PCP: [name] Reichmann, MD GYN:  [name] [name] SU: [name] [name], [name] [name],  OTHER MD: [name] [name], [place], [facility] [name] [name]  CHIEF COMPLAINT:  Adjuvant chemotherapy for Right Breast Cancer   BREAST CANCER HISTORY:  [name] [name] has a history of breast cancer dating back to 1997, [name] [name] Dr. Annabell [name] performed a right lumpectomy and axillary lymph node dissection for what according to the patient and her family was a stage I invasive ductal breast cancer. She received radiation adjuvantly and then took tamoxifen for 5 years  In February of 2014 she had a normal screening mammography, but in November 2014 she felt a "hard lump" in her right breast. She brought this to the attention of her urologist, Dr. Izora [name], and he set her up for bilateral diagnostic mammography with mammography at [facility] 07/13/2013. This showed her breast density to be category B. There was no mammographic abnormality noted but ultrasonography showed a 1.4 cm irregularly shaped solid mass in the right breast at the 8:00 position. This was biopsied the same day, and showed ([id number]) an invasive ductal carcinoma, grade 2, estrogen receptor 100% positive, progesterone receptor 13% positive, with an MIB-1 of 33% and HER-2 amplification with a HER-2: CEP 17 ratio of 3.19, and a HER-2 copy number percent all of 4.15.  The patient's subsequent history is as detailed below  INTERVAL HISTORY: [name] [name] returns today for followup of her recurrent right breast cancer. She is currently receiving trastuzumab every 3 weeks. She completed her paclitaxel treatments 12/06/2013. We have held her pertuzumab since 11/22/2013 because of diarrhea issues. She is here today for evaluation and she was recently started on Anastrazole.  She is doing well on this. She is taking this every day.  She continues to have longstanding back pain, which was  present prior to her starting this and she does see [name] [name] at [facility].  She has not seen [name] [name] since her breast cancer diagnosis and when she started chemotherapy/treatment.  She says that her spinal injections from [name] [name] have not helped.  She denies an increase in joint pain, vaginal dryness, hot flashes.  She denies fevers, chills, nausea, vomiting, constipation, chest pain, palpitations.  She is taking Omeprazole daily.  She is considering getting a GI referral from her PCP about pain in her abdomen that was evaluated with abdominal ultrasound and determined to be sludge in her gall bladder.  She also c/o a specific sock made in [place] because they hurt her feet at night, but she doesn't notice it when she doesn't wear them.  She is requesting Percocet today, it was originally prescribed by [facility].  She only takes it for severe pain in her back.    REVIEW OF SYSTEMS: A 10 point review of systems was conducted and is otherwise negative except for what is noted above.     PAST MEDICAL HISTORY: Past Medical History  Diagnosis Date  . Sinus tachycardia     mild resting  . HLD (hyperlipidemia)   . Orthostatic hypotension     slight in the past  . Anxiety   . Depression   . Diverticulosis   . Fibromyalgia 1978  . Hypothyroidism   . Alopecia   . Ruptured disk     one in neck and two in back  . Plantar fasciitis   . GERD (gastroesophageal reflux disease)   .  History of syncope     episode 2008--  no recurrence since  . Ureteral calculi     bilateral  . Leukocytosis   . Chronic back pain     "lower back and upper neck" (07/28/2013)  . 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 ear   . Urgency of urination   . Hematuria   . Sensation of pressure in bladder area   . Wears glasses   . Ecchymosis     FROM IV AND LAB WORK THE PAST WEEK  06-17-2013  . Stool incontinence     "at times recently"   . Kidney stones 2014  . CAP (community acquired pneumonia)      admission 06-04-2013 secondary to failed oral antibitotics---  last cxr 06-16-2013  much improved  . SOB (shortness of breath) 06-17-2013 CURRENTLY RESIDUAL FROM RECENT CAP    SOB from obesity and deconditioning 2009- eval included CPX  . Arthritis     "spine" (07/28/2013)  . DDD (degenerative disc disease)   . CLL (chronic lymphocytic leukemia) dx'd 05/2013    "I don't think I have it though" (07/28/2013)  . Breast cancer 1997; 2014    right    PAST SURGICAL HISTORY: Past Surgical History  Procedure Laterality Date  . Lumbar epidural injection      has had 7 injections  . Total abdominal hysterectomy w/ bilateral salpingoophorectomy  1997  . Breast lumpectomy with axillary lymph node dissection Right 1997  . Transthoracic echocardiogram  05-11-2007  dr Ron Parker    normal lvf/  ef 65%  . Tonsillectomy  AGE 64  . Retinal detachment surgery Right 2013  . Cystoscopy with retrograde pyelogram, ureteroscopy and stent placement Bilateral 06/17/2013    Procedure: CYSTOSCOPY WITH RETROGRADE PYELOGRAM, URETEROSCOPY AND LEFT DOUBLE  J STENT PLACEMENT RIGHT URETERAL HOLMIIUM LASER AND DOUBLE J STENT ;  Surgeon: Hanley Ben, MD;  Location: [place];  Service: Urology;  Laterality: Bilateral;  . Holmium laser application Bilateral 16/94/5038    Procedure: HOLMIUM LASER APPLICATION;  Surgeon: Hanley Ben, MD;  Location: [place];  Service: Urology;  Laterality: Bilateral;  . Mastectomy w/ nodes partial Right 1997  . Mastectomy complete / simple Right 07/28/2013  . Appendectomy  1997  . Breast biopsy Right 2014  . Cystoscopy with ureteroscopy, stone basketry and stent placement Bilateral 2014  . Lithotripsy Left ~ 06/2013  . Simple mastectomy with axillary sentinel node biopsy Right 07/28/2013    Procedure: RIGHT TOTAL  MASTECTOMY;  Surgeon: Imogene Burn. Georgette Dover, MD;  Location: [place];  Service: General;  Laterality: Right;  . Portacath placement Left 07/28/2013     Procedure: ATTEMPTED INSERTION PORT-A-CATH;  Surgeon: Imogene Burn. Georgette Dover, MD;  Location: [facility];  Service: General;  Laterality: Left;    FAMILY HISTORY Family History  Problem Relation Age of Onset  . Heart disease Maternal Grandfather   . Diabetes Maternal Grandfather   . Colon cancer Maternal Aunt 79  . Brain cancer Paternal Grandmother     dx in 6s  . Dementia Mother   . Diabetes Mother   . Osteoporosis Mother   . Diabetes Maternal Aunt   . Prostate cancer Father 81  . Bipolar disorder Maternal Aunt   . Stroke Maternal Aunt   . Stomach cancer Paternal Uncle     dx in late 37s   the patient's mother died at the age of 81. The patient's father is alive at age 50. She had no brothers or sisters. Her father  has a history of prostate cancer. There is no history of breast or ovarian cancer in the family. One maternal first cousin has a history of Hodgkin's lymphoma.  GYNECOLOGIC HISTORY:  Menarche age 6, first live birth age 33. She is GX P1. She underwent total abdominal hysterectomy and bilateral salpingo-oophorectomy in 1997 she did not take hormone replacement.  SOCIAL HISTORY: (Updated  11/15/2013)  She is a homemaker. Her husband Arnette Norris farms approximately 500 acres. 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 The patient has no grandchildren. She has two "grand cats". She attends a local united church of [place]: Not in place   HEALTH MAINTENANCE:  (Updated   11/15/2013 ) History  Substance Use Topics  . Smoking status: Never Smoker   . Smokeless tobacco: Never Used  . Alcohol Use: Yes     Comment: 07/28/2013 "no alcohol since 1994; never had problem w/it"     Colonoscopy: 2009/ [facility]  PAP: Status post hysterectomy  Bone density: May 10/14/2009 at [facility] was normal  Lipid panel:  Not on file   Allergies  Allergen Reactions  . Amoxicillin Itching  . Lasix [Furosemide]  Nausea And Vomiting and Other (See Comments)    headache  . Lithium Other (See Comments)    Dizziness, "cause me to fall"  . Sulfa Antibiotics Hives  . Trazodone And Nefazodone Other (See Comments)    insomnia  . Lyrica [Pregabalin] Diarrhea, Nausea Only and Rash    Current Outpatient Prescriptions  Medication Sig Dispense Refill  . ALPRAZolam (XANAX) 0.5 MG tablet TAKE 1 TABLET EVERY 8 HOURS AS NEEDED  30 tablet  0  . amitriptyline (ELAVIL) 150 MG tablet Take 150 mg by mouth at bedtime.      . Ascorbic Acid (VITAMIN C) 1000 MG tablet Take 1,000 mg by mouth daily.      Marland Kitchen buPROPion (WELLBUTRIN SR) 150 MG 12 hr tablet Take 150-300 mg by mouth 2 (two) times daily. Take 378m in the morning and 1578mat 2pm      . cetirizine (ZYRTEC) 10 MG tablet TAKE ONE TABLET BY MOUTH DAILY AS NEEDED  30 tablet  4  . Cholecalciferol (VITAMIN D-3) 1000 UNITS CAPS Take 5,000 Units by mouth daily.      . Marland Kitchenbuprofen (ADVIL,MOTRIN) 200 MG tablet Take 800 mg by mouth every 6 (six) hours as needed for moderate pain.      . Marland Kitchenevothyroxine (SYNTHROID, LEVOTHROID) 50 MCG tablet Take 50 mcg by mouth daily before breakfast.      . lidocaine-prilocaine (EMLA) cream APPLY 1 APPLICATION TOPICALLY AS NEEDED.  30 g  3  . metoprolol succinate (TOPROL-XL) 25 MG 24 hr tablet Take 1 tablet (25 mg total) by mouth daily.  30 tablet  5  . minoxidil (ROGAINE) 2 % external solution Apply 1 application topically 2 (two) times daily as needed.       . Multiple Vitamins-Minerals (HAIR VITAMINS PO) Take 1 tablet by mouth 2 (two) times daily.      . Omega-3 Fatty Acids (FISH OIL PO) Take 1 capsule by mouth daily.       . Marland Kitchenmeprazole (PRILOSEC) 40 MG capsule Take 1 capsule (40 mg total) by mouth every morning.  30 capsule  3  . Oxcarbazepine (TRILEPTAL) 300 MG tablet Take 300 mg by mouth 2 (two) times daily.      . Vilazodone HCl (VIIBRYD) 20 MG TABS Take 1 tablet  by mouth daily.      . vitamin B-12 (CYANOCOBALAMIN) 1000 MCG tablet Take  1,000 mcg by mouth daily.      Marland Kitchen anastrozole (ARIMIDEX) 1 MG tablet TAKE 1 TABLET (1 MG TOTAL) BY MOUTH DAILY.  30 tablet  1  . cholestyramine (QUESTRAN) 4 G packet Take 1 packet (4 g total) by mouth 2 (two) times daily as needed (diarrhea). Take with meals.  40 each  1  . ondansetron (ZOFRAN) 8 MG tablet 1 tab by mouth at dinner on day of chemo, then 1 tab with breakfast and dinner on day after chemo.  Then 1 tab every 12 hrs PRN nausea  30 tablet  1  . oxyCODONE-acetaminophen (PERCOCET/ROXICET) 5-325 MG per tablet Take 1 tablet by mouth every 4 (four) hours as needed for moderate pain.  40 tablet  0  . potassium chloride (MICRO-K) 10 MEQ CR capsule 2 tabs by mouth in the morning and 1 tab by mouth in the evening  90 capsule  3  . prochlorperazine (COMPAZINE) 10 MG tablet Take 1 tablet (10 mg total) by mouth 4 (four) times daily -  before meals and at bedtime. Starting at supper day before treatment, then evening of treatment and continue thru next day, then PRN  30 tablet  1   No current facility-administered medications for this visit.    OBJECTIVE: 68 year old white woman in no acute distress Filed Vitals:   03/16/14 0934  BP: 117/56  Pulse: 81  Temp: 98.3 F (36.8 C)  Resp: 18     Body mass index is 30.14 kg/(m^2).    ECOG FS:1 - Symptomatic but completely ambulatory Filed Weights   03/16/14 0934  Weight: 181 lb 1.6 oz (82.146 kg)   Sclerae unicteric, EOMs intact Oropharynx clear and moist No cervical or supraclavicular adenopathy, no axillary or inguinal adenopathy Lungs no rales or rhonchi Heart regular rate and rhythm Abd soft, nontender, positive bowel sounds MSK no focal spinal tenderness, no upper extremity lymphedema Neuro: nonfocal, well oriented, anxious affect Breasts: The right breast is status post mastectomy. There is no evidence of local recurrence. The right axilla is benign. Left breast is unremarkable.  LAB RESULTS:   Lab Results  Component Value Date    WBC 11.4* 03/16/2014   NEUTROABS 4.7 03/16/2014   HGB 11.5* 03/16/2014   HCT 35.9 03/16/2014   MCV 85.4 03/16/2014   PLT 219 03/16/2014      Chemistry      Component Value Date/Time   NA 146* 03/16/2014 0856   NA 145 07/30/2013 0422   K 3.8 03/16/2014 0856   K 3.9 07/30/2013 0422   CL 111 07/30/2013 0422   CO2 26 03/16/2014 0856   CO2 25 07/30/2013 0422   BUN 18.9 03/16/2014 0856   BUN 6 07/30/2013 0422   CREATININE 1.3* 03/16/2014 0856   CREATININE 0.76 07/30/2013 0422      Component Value Date/Time   CALCIUM 9.5 03/16/2014 0856   CALCIUM 8.3* 07/30/2013 0422   ALKPHOS 92 03/16/2014 0856   ALKPHOS 196* 06/04/2013 2221   AST 25 03/16/2014 0856   AST 30 06/04/2013 2221   ALT 35 03/16/2014 0856   ALT 45* 06/04/2013 2221   BILITOT 0.25 03/16/2014 0856   BILITOT 0.3 06/04/2013 2221      STUDIES:  An echocardiogram on 11/09/2013 showed an ejection fraction of 55-60%.   B[facility] density [facility] at [facility] 12/15/2013 was normal, with a lowest T. value at the right femoral neck  being -1.0   ASSESSMENT: 68 y.o. BRCA negative [place] woman  (1) status post right lumpectomy and axillary lymph node dissection in 1997 for a stage I breast cancer, treated with adjuvant radiation and tamoxifen for 5 years  (2) status post right breast biopsy 07/13/2013 for a clinical T1c N0, stage IA invasive ductal carcinoma, grade 2, estrogen receptor 100% positive, progesterone receptor 13% positive, with an MIB-1 of 33%, and HER-2 amplification by CISH with a HER2/CEP 17 ratio of 3.19, and an average HER-2 copy number per cell of 4.15  (3) status post right mastectomy 07/28/2013 for a pT1c pN0, stage IA invasive ductal carcinoma, grade 3, with close but negative margins. Prognostic panel was not repeated  (4) completed weekly paclitaxel x12 12/06/2913, with trastuzumab/ pertuzumab every 3 weeks; pertuzumab was held with third dose on 11/01/2013 due to diarrhea, tried a half dose on cycle 4 again with diarrhea  developing  (5) trastuzumab (started 09/20/2013) to be continued for 1 year; most recent echocardiogram on 03/02/2014  shows a well preserved ejection fraction  (6) anastrozole started May 2015; bone density 12/15/2013 normal   OTHER PROBLEMS:  (a) History of chronic lymphoid leukemia diagnosed by flow cytometry 06/29/2013, the cells being CD5, CD20 and CD23 positive, CD10 negative.   (b) Anemia with a normal MCV and normal ferritin-- B-12 and folate normal  (c) the patient met with Dr. Harlow Mares and has decided against reconstruction   PLAN:  [name] [name] is doing well today.  Her labs are stable. She does have a slightly elevated WBC with lymphocytes, at 11.4 and 5.6 respectively.  She is only slightly anemic today.  I reviewed these with her in detail.    She is tolerating the Arimidex well and I recommended she continue taking this daily.    She will proceed with Trastuzumab today.  She is tolerating this well.  She will continue to receive trastuzumab every 3 weeks and will complete this in January 2015.  Her last echocardiogram was normal on 03/02/2014.    Her diarrhea will continue to be managed with imodium if needed.    I gave the patient a one time prescription for Percocet #40 since she is out.  I instructed her to f/u with the pain clinic or her PCP to manage her chronic low back pain from here on out.    [name] [name] has a good understanding of the overall plan. She agrees with it. She knows the goal of treatment in her case is cure. She will call with any problems that may develop before next visit here.  I spent 25 minutes counseling the patient face to face.  The total time spent in the appointment was 30 minutes.   Minette Headland, [place] [phone]   03/18/2014 7:19 AM

## 2014-03-16 NOTE — Telephone Encounter (Signed)
gv pt appt schedule for aug thru nov

## 2014-03-16 NOTE — Telephone Encounter (Signed)
add to previous note. order for echo to [facility] for preauth.

## 2014-03-16 NOTE — Patient Instructions (Signed)

## 2014-03-21 ENCOUNTER — Telehealth: Payer: Self-pay | Admitting: Oncology

## 2014-03-21 ENCOUNTER — Encounter: Payer: Self-pay | Admitting: Family Medicine

## 2014-03-21 ENCOUNTER — Other Ambulatory Visit: Payer: Self-pay | Admitting: *Deleted

## 2014-03-21 DIAGNOSIS — K219 Gastro-esophageal reflux disease without esophagitis: Secondary | ICD-10-CM

## 2014-03-21 MED ORDER — OMEPRAZOLE 40 MG PO CPDR: 40.0000 mg | DELAYED_RELEASE_CAPSULE | Freq: Every morning | ORAL | Status: DC

## 2014-03-21 NOTE — Telephone Encounter (Signed)
add to previous note - pt to get new schedule 8/13.

## 2014-03-21 NOTE — Telephone Encounter (Signed)
lmonvm for pt re echo for 10/9. pt given other appts prior to leaving 7/23. per Cyndee Brightly preauth [id number] and Josem Kaufmann is good until 09/16/14.

## 2014-04-06 ENCOUNTER — Ambulatory Visit (HOSPITAL_BASED_OUTPATIENT_CLINIC_OR_DEPARTMENT_OTHER): Payer: Medicare Other

## 2014-04-06 ENCOUNTER — Other Ambulatory Visit: Payer: Self-pay | Admitting: Hematology

## 2014-04-06 ENCOUNTER — Other Ambulatory Visit (HOSPITAL_BASED_OUTPATIENT_CLINIC_OR_DEPARTMENT_OTHER): Payer: Medicare Other

## 2014-04-06 VITALS — BP 126/53 | HR 85 | Temp 98.7°F | Resp 18

## 2014-04-06 DIAGNOSIS — C50911 Malignant neoplasm of unspecified site of right female breast: Secondary | ICD-10-CM

## 2014-04-06 DIAGNOSIS — C50511 Malignant neoplasm of lower-outer quadrant of right female breast: Secondary | ICD-10-CM

## 2014-04-06 DIAGNOSIS — C50919 Malignant neoplasm of unspecified site of unspecified female breast: Secondary | ICD-10-CM

## 2014-04-06 DIAGNOSIS — Z5112 Encounter for antineoplastic immunotherapy: Secondary | ICD-10-CM

## 2014-04-06 DIAGNOSIS — C9110 Chronic lymphocytic leukemia of B-cell type not having achieved remission: Secondary | ICD-10-CM

## 2014-04-06 DIAGNOSIS — C50519 Malignant neoplasm of lower-outer quadrant of unspecified female breast: Secondary | ICD-10-CM

## 2014-04-06 LAB — CBC WITH DIFFERENTIAL/PLATELET
BASO%: 0.6 % (ref 0.0–2.0)
Basophils Absolute: 0.1 10*3/uL (ref 0.0–0.1)
EOS%: 1.5 % (ref 0.0–7.0)
Eosinophils Absolute: 0.2 10*3/uL (ref 0.0–0.5)
HCT: 34.8 % (ref 34.8–46.6)
HGB: 11.2 g/dL — ABNORMAL LOW (ref 11.6–15.9)
LYMPH%: 55.6 % — ABNORMAL HIGH (ref 14.0–49.7)
MCH: 26.8 pg (ref 25.1–34.0)
MCHC: 32.1 g/dL (ref 31.5–36.0)
MCV: 83.7 fL (ref 79.5–101.0)
MONO#: 0.9 10*3/uL (ref 0.1–0.9)
MONO%: 6.4 % (ref 0.0–14.0)
NEUT#: 5 10*3/uL (ref 1.5–6.5)
NEUT%: 35.9 % — AB (ref 38.4–76.8)
PLATELETS: 214 10*3/uL (ref 145–400)
RBC: 4.16 10*6/uL (ref 3.70–5.45)
RDW: 13.5 % (ref 11.2–14.5)
WBC: 14 10*3/uL — AB (ref 3.9–10.3)
lymph#: 7.8 10*3/uL — ABNORMAL HIGH (ref 0.9–3.3)

## 2014-04-06 LAB — COMPREHENSIVE METABOLIC PANEL (CC13)
ALT: 31 U/L (ref 0–55)
ANION GAP: 9 meq/L (ref 3–11)
AST: 25 U/L (ref 5–34)
Albumin: 3.9 g/dL (ref 3.5–5.0)
Alkaline Phosphatase: 79 U/L (ref 40–150)
BILIRUBIN TOTAL: 0.2 mg/dL (ref 0.20–1.20)
BUN: 13.3 mg/dL (ref 7.0–26.0)
CO2: 25 meq/L (ref 22–29)
Calcium: 9.3 mg/dL (ref 8.4–10.4)
Chloride: 102 mEq/L (ref 98–109)
Creatinine: 0.9 mg/dL (ref 0.6–1.1)
Glucose: 92 mg/dl (ref 70–140)
Potassium: 4.3 mEq/L (ref 3.5–5.1)
Sodium: 137 mEq/L (ref 136–145)
Total Protein: 6.4 g/dL (ref 6.4–8.3)

## 2014-04-06 LAB — TECHNOLOGIST REVIEW

## 2014-04-06 LAB — MAGNESIUM (CC13): MAGNESIUM: 2.4 mg/dL (ref 1.5–2.5)

## 2014-04-06 MED ORDER — HEPARIN SOD (PORK) LOCK FLUSH 100 UNIT/ML IV SOLN
500.0000 [IU] | Freq: Once | INTRAVENOUS | Status: AC | PRN
  Administered 2014-04-06: 500 [IU]
  Filled 2014-04-06: qty 5

## 2014-04-06 MED ORDER — SODIUM CHLORIDE 0.9 % IV SOLN
Freq: Once | INTRAVENOUS | Status: AC
  Administered 2014-04-06: 14:00:00 via INTRAVENOUS

## 2014-04-06 MED ORDER — TRASTUZUMAB CHEMO INJECTION 440 MG
6.0000 mg/kg | Freq: Once | INTRAVENOUS | Status: AC
  Administered 2014-04-06: 483 mg via INTRAVENOUS
  Filled 2014-04-06: qty 23

## 2014-04-06 MED ORDER — SODIUM CHLORIDE 0.9 % IJ SOLN
10.0000 mL | INTRAMUSCULAR | Status: DC | PRN
  Administered 2014-04-06: 10 mL
  Filled 2014-04-06: qty 10

## 2014-04-06 MED ORDER — ACETAMINOPHEN 325 MG PO TABS
ORAL_TABLET | ORAL | Status: AC
  Filled 2014-04-06: qty 2

## 2014-04-06 MED ORDER — ACETAMINOPHEN 325 MG PO TABS
650.0000 mg | ORAL_TABLET | Freq: Once | ORAL | Status: AC
  Administered 2014-04-06: 650 mg via ORAL

## 2014-04-06 NOTE — Patient Instructions (Signed)
[facility] Discharge Instructions for Patients Receiving Chemotherapy  Today you received the following chemotherapy agents :  Herceptin.  To help prevent nausea and vomiting after your treatment, we encourage you to take your nausea medication as instructed by your physician.   If you develop nausea and vomiting that is not controlled by your nausea medication, call the clinic.   BELOW ARE SYMPTOMS THAT SHOULD BE REPORTED IMMEDIATELY:  *FEVER GREATER THAN 100.5 F  *CHILLS WITH OR WITHOUT FEVER  NAUSEA AND VOMITING THAT IS NOT CONTROLLED WITH YOUR NAUSEA MEDICATION  *UNUSUAL SHORTNESS OF BREATH  *UNUSUAL BRUISING OR BLEEDING  TENDERNESS IN MOUTH AND THROAT WITH OR WITHOUT PRESENCE OF ULCERS  *URINARY PROBLEMS  *BOWEL PROBLEMS  UNUSUAL RASH Items with * indicate a potential emergency and should be followed up as soon as possible.  Feel free to call the clinic you have any questions or concerns. The clinic phone number is [phone].    

## 2014-04-11 ENCOUNTER — Telehealth: Payer: Self-pay | Admitting: Physician Assistant

## 2014-04-11 ENCOUNTER — Ambulatory Visit (INDEPENDENT_AMBULATORY_CARE_PROVIDER_SITE_OTHER): Payer: Medicare Other | Admitting: Emergency Medicine

## 2014-04-11 ENCOUNTER — Ambulatory Visit
Admission: RE | Admit: 2014-04-11 | Discharge: 2014-04-11 | Disposition: A | Discharge status: Home / Self Care | Payer: Medicare Other | Source: Ambulatory Visit | Attending: Emergency Medicine | Admitting: Emergency Medicine

## 2014-04-11 ENCOUNTER — Ambulatory Visit (INDEPENDENT_AMBULATORY_CARE_PROVIDER_SITE_OTHER): Payer: Medicare Other

## 2014-04-11 VITALS — BP 124/68 | HR 88 | Temp 99.5°F | Resp 16 | Ht 64.0 in | Wt 182.2 lb

## 2014-04-11 DIAGNOSIS — R350 Frequency of micturition: Secondary | ICD-10-CM

## 2014-04-11 DIAGNOSIS — G8929 Other chronic pain: Secondary | ICD-10-CM

## 2014-04-11 DIAGNOSIS — R1011 Right upper quadrant pain: Secondary | ICD-10-CM

## 2014-04-11 DIAGNOSIS — N200 Calculus of kidney: Secondary | ICD-10-CM

## 2014-04-11 LAB — COMPLETE METABOLIC PANEL WITH GFR
ALT: 37 U/L — AB (ref 0–35)
AST: 30 U/L (ref 0–37)
Albumin: 4.2 g/dL (ref 3.5–5.2)
Alkaline Phosphatase: 95 U/L (ref 39–117)
BUN: 14 mg/dL (ref 6–23)
CALCIUM: 8.9 mg/dL (ref 8.4–10.5)
CHLORIDE: 100 meq/L (ref 96–112)
CO2: 25 meq/L (ref 19–32)
Creat: 0.95 mg/dL (ref 0.50–1.10)
GFR, EST AFRICAN AMERICAN: 71 mL/min
GFR, Est Non African American: 62 mL/min
Glucose, Bld: 94 mg/dL (ref 70–99)
Potassium: 4.2 mEq/L (ref 3.5–5.3)
SODIUM: 134 meq/L — AB (ref 135–145)
Total Bilirubin: 0.6 mg/dL (ref 0.2–1.2)
Total Protein: 6.4 g/dL (ref 6.0–8.3)

## 2014-04-11 LAB — POCT UA - MICROSCOPIC ONLY
BACTERIA, U MICROSCOPIC: NEGATIVE
CASTS, UR, LPF, POC: NEGATIVE
CRYSTALS, UR, HPF, POC: NEGATIVE
Mucus, UA: NEGATIVE
YEAST UA: NEGATIVE

## 2014-04-11 LAB — POCT URINALYSIS DIPSTICK
Bilirubin, UA: NEGATIVE
Glucose, UA: NEGATIVE
Ketones, UA: NEGATIVE
Nitrite, UA: NEGATIVE
SPEC GRAV UA: 1.02
UROBILINOGEN UA: 0.2
pH, UA: 5.5

## 2014-04-11 LAB — POCT CBC
Granulocyte percent: 60.7 %G (ref 37–80)
HCT, POC: 35.7 % — AB (ref 37.7–47.9)
HEMOGLOBIN: 11.4 g/dL — AB (ref 12.2–16.2)
Lymph, poc: 5.4 — AB (ref 0.6–3.4)
MCH, POC: 26.7 pg — AB (ref 27–31.2)
MCHC: 31.9 g/dL (ref 31.8–35.4)
MCV: 83.9 fL (ref 80–97)
MID (cbc): 0.7 (ref 0–0.9)
MPV: 7.1 fL (ref 0–99.8)
POC GRANULOCYTE: 9.4 — AB (ref 2–6.9)
POC LYMPH PERCENT: 34.9 %L (ref 10–50)
POC MID %: 4.4 % (ref 0–12)
Platelet Count, POC: 191 10*3/uL (ref 142–424)
RBC: 4.25 M/uL (ref 4.04–5.48)
RDW, POC: 14.3 %
WBC: 15.5 10*3/uL — AB (ref 4.6–10.2)

## 2014-04-11 NOTE — Patient Instructions (Signed)
Your appointment is at 5:20 at [place] at [address]

## 2014-04-11 NOTE — Telephone Encounter (Signed)
[name] [name] called from [facility] about neg CT.  Reviewed results in EPIC.  Please call the patient and let her know that her CT was neg.

## 2014-04-11 NOTE — Progress Notes (Addendum)
Subjective:  This chart was scribed for [name] Queen, MD by Donato Schultz, Medical Scribe. This patient was seen in Room 1 and the patient's care was started at 12:18 PM.   Patient ID: [name] [name], female    DOB: 10/22/1945, 68 y.o.   MRN: [id number]  HPI HPI Comments: [name] [name] is a 68 y.o. female with a history of breast cancer and kidney stones who presents to the [facility] complaining of constant dysuria that she describes as a "painful pressure".  She lists fatigue, cough, and pain in her inner knees as associated symptoms. She cannot remember if these symptoms are similar to the stone she had in her right ureter in November 2014.  She has not seen he urologist since she had her kidney stones removed last year.  Her last [place] at [facility] showed did not reveal any gallstones but did reveal sludge. She had a breast cancer treatment last Thursday and she gets them every 3 weeks.      Past Medical History  Diagnosis Date  . Sinus tachycardia     mild resting  . HLD (hyperlipidemia)   . Orthostatic hypotension     slight in the past  . Anxiety   . Depression   . Diverticulosis   . Fibromyalgia 1978  . Hypothyroidism   . Alopecia   . Ruptured disk     one in neck and two in back  . Plantar fasciitis   . GERD (gastroesophageal reflux disease)   . History of syncope     episode 2008--  no recurrence since  . Ureteral calculi     bilateral  . Leukocytosis   . Chronic back pain     "lower back and upper neck" (07/28/2013)  . 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 ear   . Urgency of urination   . Hematuria   . Sensation of pressure in bladder area   . Wears glasses   . Ecchymosis     FROM IV AND LAB WORK THE PAST WEEK  06-17-2013  . Stool incontinence     "at times recently"   . Kidney stones 2014  . CAP (community acquired pneumonia)     admission 06-04-2013 secondary to failed oral antibitotics---  last cxr 06-16-2013  much improved  . SOB (shortness of breath)  06-17-2013 CURRENTLY RESIDUAL FROM RECENT CAP    SOB from obesity and deconditioning 2009- eval included CPX  . Arthritis     "spine" (07/28/2013)  . DDD (degenerative disc disease)   . CLL (chronic lymphocytic leukemia) dx'd 05/2013    "I don't think I have it though" (07/28/2013)  . Breast cancer 1997; 2014    right   Past Surgical History  Procedure Laterality Date  . Lumbar epidural injection      has had 7 injections  . Total abdominal hysterectomy w/ bilateral salpingoophorectomy  1997  . Breast lumpectomy with axillary lymph node dissection Right 1997  . Transthoracic echocardiogram  05-11-2007  dr Ron Parker    normal lvf/  ef 65%  . Tonsillectomy  AGE 73  . Retinal detachment surgery Right 2013  . Cystoscopy with retrograde pyelogram, ureteroscopy and stent placement Bilateral 06/17/2013    Procedure: CYSTOSCOPY WITH RETROGRADE PYELOGRAM, URETEROSCOPY AND LEFT DOUBLE  J STENT PLACEMENT RIGHT URETERAL HOLMIIUM LASER AND DOUBLE J STENT ;  Surgeon: Hanley Ben, MD;  Location: [facility];  Service: Urology;  Laterality: Bilateral;  . Holmium laser application Bilateral 31/51/7616  Procedure: HOLMIUM LASER APPLICATION;  Surgeon: Hanley Ben, MD;  Location: [facility];  Service: Urology;  Laterality: Bilateral;  . Mastectomy w/ nodes partial Right 1997  . Mastectomy complete / simple Right 07/28/2013  . Appendectomy  1997  . Breast biopsy Right 2014  . Cystoscopy with ureteroscopy, stone basketry and stent placement Bilateral 2014  . Lithotripsy Left ~ 06/2013  . Simple mastectomy with axillary sentinel node biopsy Right 07/28/2013    Procedure: RIGHT TOTAL  MASTECTOMY;  Surgeon: Imogene Burn. Georgette Dover, MD;  Location: [facility];  Service: General;  Laterality: Right;  . Portacath placement Left 07/28/2013    Procedure: ATTEMPTED INSERTION PORT-A-CATH;  Surgeon: Imogene Burn. Georgette Dover, MD;  Location: [facility];  Service: General;  Laterality: Left;   Family History    Problem Relation Age of Onset  . Heart disease Maternal Grandfather   . Diabetes Maternal Grandfather   . Colon cancer Maternal Aunt 79  . Brain cancer Paternal Grandmother     dx in 86s  . Dementia Mother   . Diabetes Mother   . Osteoporosis Mother   . Diabetes Maternal Aunt   . Prostate cancer Father 27  . Bipolar disorder Maternal Aunt   . Stroke Maternal Aunt   . Stomach cancer Paternal Uncle     dx in late 88s   History   Social History  . Marital Status: Married    Spouse Name: N/A    Number of Children: 1  . Years of Education: N/A   Occupational History  . homemaker    Social History Main Topics  . Smoking status: Never Smoker   . Smokeless tobacco: Never Used  . Alcohol Use: Yes     Comment: 07/28/2013 "no alcohol since 1994; never had problem w/it"  . Drug Use: No  . Sexual Activity: No     Comment: TAH/BSO   Other Topics Concern  . Not on file   Social History Narrative  . No narrative on file   Allergies  Allergen Reactions  . Amoxicillin Itching  . Lasix [Furosemide] Nausea And Vomiting and Other (See Comments)    headache  . Lithium Other (See Comments)    Dizziness, "cause me to fall"  . Sulfa Antibiotics Hives  . Trazodone And Nefazodone Other (See Comments)    insomnia  . Lyrica [Pregabalin] Diarrhea, Nausea Only and Rash    Review of Systems  Constitutional: Positive for fatigue.  Respiratory: Positive for cough.   Genitourinary: Positive for dysuria.  Musculoskeletal: Positive for arthralgias.     Objective:  Physical Exam  Nursing note and vitals reviewed. Constitutional: She is oriented to person, place, and time. She appears well-developed and well-nourished.  HENT:  Head: Normocephalic and atraumatic.  Eyes: EOM are normal.  Neck: Normal range of motion.  Cardiovascular: Normal rate and normal heart sounds.  Exam reveals no gallop and no friction rub.   No murmur heard. Slightly irregular.    Pulmonary/Chest: Effort  normal and breath sounds normal. No respiratory distress. She has no wheezes. She has no rales.  Abdominal: Soft. Bowel sounds are normal. There is tenderness.  Tender in RUQ.  No low abdominal tenderness.  No masses.  Musculoskeletal: Normal range of motion.  Neurological: She is alert and oriented to person, place, and time.  Skin: Skin is warm and dry.  Psychiatric: She has a normal mood and affect. Her behavior is normal.   Results for orders placed in visit on 04/11/14  POCT UA -  MICROSCOPIC ONLY      Result Value Ref Range   WBC, Ur, HPF, POC 6-12     RBC, urine, microscopic 0-3     Bacteria, U Microscopic neg     Mucus, UA neg     Epithelial cells, urine per micros 0-2     Crystals, Ur, HPF, POC neg     Casts, Ur, LPF, POC neg     Yeast, UA neg    POCT URINALYSIS DIPSTICK      Result Value Ref Range   Color, UA yellow     Clarity, UA cloudy     Glucose, UA neg     Bilirubin, UA neg     Ketones, UA neg     Spec Grav, UA 1.020     Blood, UA small     pH, UA 5.5     Protein, UA trace     Urobilinogen, UA 0.2     Nitrite, UA neg     Leukocytes, UA large (3+)    POCT CBC      Result Value Ref Range   WBC 15.5 (*) 4.6 - 10.2 K/uL   Lymph, poc 5.4 (*) 0.6 - 3.4   POC LYMPH PERCENT 34.9  10 - 50 %L   MID (cbc) 0.7  0 - 0.9   POC MID % 4.4  0 - 12 %M   POC Granulocyte 9.4 (*) 2 - 6.9   Granulocyte percent 60.7  37 - 80 %G   RBC 4.25  4.04 - 5.48 M/uL   Hemoglobin 11.4 (*) 12.2 - 16.2 g/dL   HCT, POC 35.7 (*) 37.7 - 47.9 %   MCV 83.9  80 - 97 fL   MCH, POC 26.7 (*) 27 - 31.2 pg   MCHC 31.9  31.8 - 35.4 g/dL   RDW, POC 14.3     Platelet Count, POC 191  142 - 424 K/uL   MPV 7.1  0 - 99.8 fL   [facility] reading (PRIMARY) by  Dr.Lanard Arguijo there is no free air. There is no evidence of obstruction. There are no acute changes seen. I did not see any definite kidney stones    BP 124/68  Pulse 88  Temp(Src) 99.5 F (37.5 C) (Oral)  Resp 16  Ht 5\' 4"  (1.626 m)  Wt 182 lb 3.2 oz  (82.645 kg)  BMI 31.26 kg/m2  SpO2 99%  LMP 08/26/1995 Assessment & Plan:  Her white count is elevated but she does have a history of CLL. I did add a urine culture CT to be done to rule out a stone. Referral made to GI to evaluate for right upper quadrant pain as well as consideration for colonoscopy. CT of the renal system is normal. Await  results of urine culture and blood work. Recheck in 2-3 days if not improving  I personally performed the services described in this documentation, which was scribed in my presence. The recorded information has been reviewed and is accurate.

## 2014-04-13 MED ORDER — CIPROFLOXACIN HCL 250 MG PO TABS: 250.0000 mg | ORAL_TABLET | Freq: Two times a day (BID) | ORAL | Status: DC

## 2014-04-13 NOTE — Addendum Note (Signed)
Addended by: Jethro Bolus A on: 04/13/2014 09:33 AM   Modules accepted: Orders

## 2014-04-14 LAB — URINE CULTURE: Colony Count: >=100000

## 2014-04-17 ENCOUNTER — Ambulatory Visit (INDEPENDENT_AMBULATORY_CARE_PROVIDER_SITE_OTHER): Payer: Medicare Other | Admitting: Obstetrics & Gynecology

## 2014-04-17 ENCOUNTER — Other Ambulatory Visit: Payer: Self-pay | Admitting: *Deleted

## 2014-04-17 ENCOUNTER — Encounter: Payer: Self-pay | Admitting: Obstetrics & Gynecology

## 2014-04-17 VITALS — BP 110/68 | HR 88 | Resp 18 | Ht 64.75 in | Wt 181.0 lb

## 2014-04-17 DIAGNOSIS — K219 Gastro-esophageal reflux disease without esophagitis: Secondary | ICD-10-CM

## 2014-04-17 DIAGNOSIS — Z01419 Encounter for gynecological examination (general) (routine) without abnormal findings: Secondary | ICD-10-CM

## 2014-04-17 MED ORDER — ANASTROZOLE 1 MG PO TABS: ORAL_TABLET | ORAL | Status: DC

## 2014-04-17 MED ORDER — OMEPRAZOLE 40 MG PO CPDR: 40.0000 mg | DELAYED_RELEASE_CAPSULE | Freq: Every morning | ORAL | Status: DC

## 2014-04-17 NOTE — Telephone Encounter (Signed)
Erroneous encounter.  Rx refill cancelled.  Patient has appt today.

## 2014-04-17 NOTE — Progress Notes (Signed)
68 y.o. G1P1 MarriedCaucasianF here for annual exam.  Diagnosed with recurrent breast cancer last summer.  Had simple mastectomy performed by Dr. Georgette Dover.  Reports she had fluid collection in the incision that had to be drawn off in the office.  Took longer to heal than expected.  Finished chemo 4/15.  On herceptin now.  Will be on this for one year.  Using her port for this.  Has seen Dr. Harlow Mares for consultation regarding breast reconstruction.    No vaginal bleeding.  Patient's last menstrual period was 08/26/1995.          Sexually active: No.  The current method of family planning is status post hysterectomy.    Exercising: No.  The patient does not participate in regular exercise at present. Smoker:  no  Health Maintenance: Pap:  01/2012  History of abnormal Pap:  no MMG:  06/2013 BIRADS0: Incomplete, 07/13/2013 US/Diagnostic MMG right - Breast Cancer  Colonoscopy:  2007.  Scheduled for October with Dr. Ardis Hughs due to chronic diarrhea BMD:   11/2013 TDaP:  2007 Screening Labs: PCP, Hb today: PCP, Urine today: PCP   reports that she has never smoked. She has never used smokeless tobacco. She reports that she does not drink alcohol or use illicit drugs.  Past Medical History  Diagnosis Date  . Sinus tachycardia     mild resting  . HLD (hyperlipidemia)   . Orthostatic hypotension     slight in the past  . Anxiety   . Depression   . Diverticulosis   . Fibromyalgia 1978  . Hypothyroidism   . Alopecia   . Ruptured disk     one in neck and two in back  . Plantar fasciitis   . GERD (gastroesophageal reflux disease)   . History of syncope     episode 2008--  no recurrence since  . Ureteral calculi     bilateral  . Leukocytosis   . Chronic back pain     "lower back and upper neck" (07/28/2013)  . 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 ear   . Urgency of urination   . Hematuria   . Sensation of pressure in bladder area   . Wears glasses   . Ecchymosis     FROM IV AND LAB WORK THE PAST WEEK   06-17-2013  . Stool incontinence     "at times recently"   . Kidney stones 2014  . CAP (community acquired pneumonia)     admission 06-04-2013 secondary to failed oral antibitotics---  last cxr 06-16-2013  much improved  . SOB (shortness of breath) 06-17-2013 CURRENTLY RESIDUAL FROM RECENT CAP    SOB from obesity and deconditioning 2009- eval included CPX  . Arthritis     "spine" (07/28/2013)  . DDD (degenerative disc disease)   . CLL (chronic lymphocytic leukemia) dx'd 05/2013    "I don't think I have it though" (07/28/2013)  . Breast cancer 1997; 2014    right    Past Surgical History  Procedure Laterality Date  . Lumbar epidural injection      has had 7 injections  . Total abdominal hysterectomy w/ bilateral salpingoophorectomy  1997  . Breast lumpectomy with axillary lymph node dissection Right 1997  . Transthoracic echocardiogram  05-11-2007  dr Ron Parker    normal lvf/  ef 65%  . Tonsillectomy  AGE 44  . Retinal detachment surgery Right 2013  . Cystoscopy with retrograde pyelogram, ureteroscopy and stent placement Bilateral 06/17/2013    Procedure: CYSTOSCOPY WITH RETROGRADE PYELOGRAM,  URETEROSCOPY AND LEFT DOUBLE  J STENT PLACEMENT RIGHT URETERAL HOLMIIUM LASER AND DOUBLE J STENT ;  Surgeon: Hanley Ben, MD;  Location: [facility];  Service: Urology;  Laterality: Bilateral;  . Holmium laser application Bilateral 40/03/6760    Procedure: HOLMIUM LASER APPLICATION;  Surgeon: Hanley Ben, MD;  Location: [facility];  Service: Urology;  Laterality: Bilateral;  . Mastectomy w/ nodes partial Right 1997  . Mastectomy complete / simple Right 07/28/2013  . Appendectomy  1997  . Breast biopsy Right 2014  . Cystoscopy with ureteroscopy, stone basketry and stent placement Bilateral 2014  . Lithotripsy Left ~ 06/2013  . Simple mastectomy with axillary sentinel node biopsy Right 07/28/2013    Procedure: RIGHT TOTAL  MASTECTOMY;  Surgeon: Imogene Burn. Georgette Dover,  MD;  Location: [facility];  Service: General;  Laterality: Right;  . Portacath placement Left 07/28/2013    Procedure: ATTEMPTED INSERTION PORT-A-CATH;  Surgeon: Imogene Burn. Georgette Dover, MD;  Location: [facility];  Service: General;  Laterality: Left;    Current Outpatient Prescriptions  Medication Sig Dispense Refill  . ALPRAZolam (XANAX) 0.5 MG tablet TAKE 1 TABLET EVERY 8 HOURS AS NEEDED  30 tablet  0  . amitriptyline (ELAVIL) 150 MG tablet Take 150 mg by mouth at bedtime.      Marland Kitchen anastrozole (ARIMIDEX) 1 MG tablet TAKE 1 TABLET (1 MG TOTAL) BY MOUTH DAILY.  90 tablet  0  . Ascorbic Acid (VITAMIN C) 1000 MG tablet Take 1,000 mg by mouth daily.      Marland Kitchen buPROPion (WELLBUTRIN SR) 150 MG 12 hr tablet Take 150-300 mg by mouth 2 (two) times daily. Take 300mg  in the morning and 150mg  at 2pm      . cetirizine (ZYRTEC) 10 MG tablet TAKE ONE TABLET BY MOUTH DAILY AS NEEDED  30 tablet  4  . Cholecalciferol (VITAMIN D-3) 1000 UNITS CAPS Take 5,000 Units by mouth daily.      . ciprofloxacin (CIPRO) 250 MG tablet Take 1 tablet (250 mg total) by mouth 2 (two) times daily.  10 tablet  0  . ibuprofen (ADVIL,MOTRIN) 200 MG tablet Take 800 mg by mouth every 6 (six) hours as needed for moderate pain.      Marland Kitchen levothyroxine (SYNTHROID, LEVOTHROID) 50 MCG tablet Take 50 mcg by mouth daily before breakfast.      . lidocaine-prilocaine (EMLA) cream APPLY 1 APPLICATION TOPICALLY AS NEEDED.  30 g  3  . metoprolol succinate (TOPROL-XL) 25 MG 24 hr tablet Take 1 tablet (25 mg total) by mouth daily.  30 tablet  5  . minoxidil (ROGAINE) 2 % external solution Apply 1 application topically 2 (two) times daily as needed.       . Multiple Vitamins-Minerals (HAIR VITAMINS PO) Take 1 tablet by mouth 2 (two) times daily.      . Omega-3 Fatty Acids (FISH OIL PO) Take 1 capsule by mouth daily.       Marland Kitchen omeprazole (PRILOSEC) 40 MG capsule Take 1 capsule (40 mg total) by mouth every morning.  90 capsule  0  . Oxcarbazepine (TRILEPTAL) 300 MG tablet Take  300 mg by mouth 2 (two) times daily.      Marland Kitchen oxyCODONE-acetaminophen (PERCOCET/ROXICET) 5-325 MG per tablet Take 1 tablet by mouth every 4 (four) hours as needed for moderate pain.  40 tablet  0  . Vilazodone HCl (VIIBRYD) 20 MG TABS Take 1 tablet by mouth daily.       No current facility-administered medications for  this visit.    Family History  Problem Relation Age of Onset  . Heart disease Maternal Grandfather   . Diabetes Maternal Grandfather   . Colon cancer Maternal Aunt 79  . Brain cancer Paternal Grandmother     dx in 42s  . Dementia Mother   . Diabetes Mother   . Osteoporosis Mother   . Diabetes Maternal Aunt   . Prostate cancer Father 21  . Bipolar disorder Maternal Aunt   . Stroke Maternal Aunt   . Stomach cancer Paternal Uncle     dx in late 29s    ROS:  Pertinent items are noted in HPI.  Otherwise, a comprehensive ROS was negative.  Exam:   BP 110/68  Pulse 88  Resp 18  Ht 5' 4.75" (1.645 m)  Wt 181 lb (82.101 kg)  BMI 30.34 kg/m2  LMP 08/26/1995  Weight change: -10#  Height: 5' 4.75" (164.5 cm)  Ht Readings from Last 3 Encounters:  04/17/14 5' 4.75" (1.645 m)  04/11/14 5\' 4"  (1.626 m)  03/16/14 5\' 5"  (1.651 m)    General appearance: alert, cooperative and appears stated age Head: Normocephalic, without obvious abnormality, atraumatic Neck: no adenopathy, supple, symmetrical, trachea midline and thyroid normal to inspection and palpation Lungs: clear to auscultation bilaterally Breasts: normal appearance, no masses or tenderness, absent right breast.  Left breast without masses, no skin changes,  no nipple retraction Heart: regular rate and rhythm Abdomen: soft, non-tender; bowel sounds normal; no masses,  no organomegaly Extremities: extremities normal, atraumatic, no cyanosis or edema Skin: Skin color, texture, turgor normal. No rashes or lesions Lymph nodes: Cervical, supraclavicular, and axillary nodes normal. No abnormal inguinal nodes  palpated Neurologic: Grossly normal   Pelvic: External genitalia:  no lesions              Urethra:  normal appearing urethra with no masses, tenderness or lesions              Bartholins and Skenes: normal                 Vagina: normal appearing vagina with normal color and discharge, no lesions              Cervix: no lesions              Pap taken: No. Bimanual Exam:  Uterus:  uterus absent              Adnexa: no mass, fullness, tenderness               Rectovaginal: Confirms               Anus:  normal sphincter tone, no lesions  A:  Well Woman with normal exam S/P TAH/BSO H/O recurrent breast cancer s/p simple mastectomy, chemo H/O CLL H/O nephrolithiasis Alopecia Diarrhea since surgery.  Evaluation, per pt, was negative H/o fatty liver, diagnosed 2010.  Seen on imaging this year.  P:   Mammogram yearly pap smear not done.  H/O TAH/BSO.  Last pap 2013. Labs with oncologist and PCP return annually or prn  An After Visit Summary was printed and given to the patient.

## 2014-04-18 ENCOUNTER — Other Ambulatory Visit: Payer: Self-pay

## 2014-04-18 DIAGNOSIS — E039 Hypothyroidism, unspecified: Secondary | ICD-10-CM

## 2014-04-18 NOTE — Telephone Encounter (Signed)
Last AEX: 04/17/14 Last refill:07/27/13 Current AEX:05/25/15 Last TSH: 06/13/13 0.944  Pharmacy is asking for a 90 day supply. Ok to fill?

## 2014-04-19 NOTE — Telephone Encounter (Signed)
LMOM to contact the office

## 2014-04-19 NOTE — Telephone Encounter (Signed)
[name] [name],  I do not see clearly that we have been prescribing the patient's Synthroid. Is her PCP taking care of this? If we are responsible, she will need to come in for full thyroid function panel first.

## 2014-04-20 MED ORDER — LEVOTHYROXINE SODIUM 50 MCG PO TABS: 50.0000 ug | ORAL_TABLET | Freq: Every day | ORAL | Status: DC

## 2014-04-20 NOTE — Addendum Note (Signed)
Addended by: Megan Salon on: 04/20/2014 05:33 PM   Modules accepted: Orders

## 2014-04-20 NOTE — Telephone Encounter (Signed)
Pt informed and voiced understanding. Pt states she has gotten lab work done recently and will check to see is TSH was done and if so, she will have the labs faxed to [place]. . Lab appt made for just in case she doesn't have them Encounter closed

## 2014-04-20 NOTE — Telephone Encounter (Signed)
Please let pt know rx for 90 days but I didn't put in RFs because I didn't check her TSH when she was here.  I do want her to do this.  Please put her on the lab schedule and I can place orders.  Thanks.

## 2014-04-20 NOTE — Telephone Encounter (Signed)
LMOM to contact office 

## 2014-04-21 ENCOUNTER — Encounter (INDEPENDENT_AMBULATORY_CARE_PROVIDER_SITE_OTHER): Payer: Self-pay | Admitting: Surgery

## 2014-04-25 ENCOUNTER — Other Ambulatory Visit: Payer: Self-pay

## 2014-04-27 ENCOUNTER — Other Ambulatory Visit (HOSPITAL_BASED_OUTPATIENT_CLINIC_OR_DEPARTMENT_OTHER): Payer: Medicare Other

## 2014-04-27 ENCOUNTER — Ambulatory Visit (HOSPITAL_BASED_OUTPATIENT_CLINIC_OR_DEPARTMENT_OTHER): Payer: Medicare Other

## 2014-04-27 ENCOUNTER — Other Ambulatory Visit: Payer: Self-pay | Admitting: Oncology

## 2014-04-27 VITALS — BP 143/69 | HR 80 | Temp 98.6°F

## 2014-04-27 DIAGNOSIS — Z23 Encounter for immunization: Secondary | ICD-10-CM

## 2014-04-27 DIAGNOSIS — C50912 Malignant neoplasm of unspecified site of left female breast: Secondary | ICD-10-CM

## 2014-04-27 DIAGNOSIS — C50519 Malignant neoplasm of lower-outer quadrant of unspecified female breast: Secondary | ICD-10-CM

## 2014-04-27 DIAGNOSIS — Z5112 Encounter for antineoplastic immunotherapy: Secondary | ICD-10-CM

## 2014-04-27 DIAGNOSIS — C50911 Malignant neoplasm of unspecified site of right female breast: Secondary | ICD-10-CM

## 2014-04-27 DIAGNOSIS — C50511 Malignant neoplasm of lower-outer quadrant of right female breast: Secondary | ICD-10-CM

## 2014-04-27 DIAGNOSIS — C50919 Malignant neoplasm of unspecified site of unspecified female breast: Secondary | ICD-10-CM

## 2014-04-27 DIAGNOSIS — C9110 Chronic lymphocytic leukemia of B-cell type not having achieved remission: Secondary | ICD-10-CM

## 2014-04-27 LAB — CBC WITH DIFFERENTIAL/PLATELET
BASO%: 1.1 % (ref 0.0–2.0)
Basophils Absolute: 0.1 10*3/uL (ref 0.0–0.1)
EOS%: 1.3 % (ref 0.0–7.0)
Eosinophils Absolute: 0.2 10*3/uL (ref 0.0–0.5)
HCT: 35.2 % (ref 34.8–46.6)
HGB: 11.1 g/dL — ABNORMAL LOW (ref 11.6–15.9)
LYMPH%: 60 % — ABNORMAL HIGH (ref 14.0–49.7)
MCH: 26.4 pg (ref 25.1–34.0)
MCHC: 31.6 g/dL (ref 31.5–36.0)
MCV: 83.5 fL (ref 79.5–101.0)
MONO#: 0.6 10*3/uL (ref 0.1–0.9)
MONO%: 4.8 % (ref 0.0–14.0)
NEUT#: 4.3 10*3/uL (ref 1.5–6.5)
NEUT%: 32.8 % — ABNORMAL LOW (ref 38.4–76.8)
Platelets: 347 10*3/uL (ref 145–400)
RBC: 4.22 10*6/uL (ref 3.70–5.45)
RDW: 14.8 % — AB (ref 11.2–14.5)
WBC: 13.2 10*3/uL — AB (ref 3.9–10.3)
lymph#: 7.9 10*3/uL — ABNORMAL HIGH (ref 0.9–3.3)

## 2014-04-27 LAB — COMPREHENSIVE METABOLIC PANEL (CC13)
ALT: 26 U/L (ref 0–55)
ANION GAP: 10 meq/L (ref 3–11)
AST: 21 U/L (ref 5–34)
Albumin: 4.1 g/dL (ref 3.5–5.0)
Alkaline Phosphatase: 104 U/L (ref 40–150)
BUN: 19.9 mg/dL (ref 7.0–26.0)
CALCIUM: 9.8 mg/dL (ref 8.4–10.4)
CO2: 26 meq/L (ref 22–29)
Chloride: 106 mEq/L (ref 98–109)
Creatinine: 1.2 mg/dL — ABNORMAL HIGH (ref 0.6–1.1)
Glucose: 90 mg/dl (ref 70–140)
Potassium: 4.2 mEq/L (ref 3.5–5.1)
SODIUM: 143 meq/L (ref 136–145)
TOTAL PROTEIN: 6.9 g/dL (ref 6.4–8.3)
Total Bilirubin: <0.2 mg/dL (ref 0.20–1.20)

## 2014-04-27 LAB — MAGNESIUM (CC13): Magnesium: 2.2 mg/dl (ref 1.5–2.5)

## 2014-04-27 MED ORDER — SODIUM CHLORIDE 0.9 % IJ SOLN
10.0000 mL | INTRAMUSCULAR | Status: DC | PRN
  Administered 2014-04-27: 10 mL
  Filled 2014-04-27: qty 10

## 2014-04-27 MED ORDER — HEPARIN SOD (PORK) LOCK FLUSH 100 UNIT/ML IV SOLN
500.0000 [IU] | Freq: Once | INTRAVENOUS | Status: AC | PRN
  Administered 2014-04-27: 500 [IU]
  Filled 2014-04-27: qty 5

## 2014-04-27 MED ORDER — ACETAMINOPHEN 325 MG PO TABS
ORAL_TABLET | ORAL | Status: AC
  Filled 2014-04-27: qty 2

## 2014-04-27 MED ORDER — SODIUM CHLORIDE 0.9 % IV SOLN
Freq: Once | INTRAVENOUS | Status: AC
  Administered 2014-04-27: 14:00:00 via INTRAVENOUS

## 2014-04-27 MED ORDER — INFLUENZA VAC SPLIT QUAD 0.5 ML IM SUSY
0.5000 mL | PREFILLED_SYRINGE | Freq: Once | INTRAMUSCULAR | Status: AC
  Administered 2014-04-27: 0.5 mL via INTRAMUSCULAR
  Filled 2014-04-27: qty 0.5

## 2014-04-27 MED ORDER — TRASTUZUMAB CHEMO INJECTION 440 MG
6.0000 mg/kg | Freq: Once | INTRAVENOUS | Status: AC
  Administered 2014-04-27: 483 mg via INTRAVENOUS
  Filled 2014-04-27: qty 23

## 2014-04-27 MED ORDER — ACETAMINOPHEN 325 MG PO TABS
650.0000 mg | ORAL_TABLET | Freq: Once | ORAL | Status: AC
  Administered 2014-04-27: 650 mg via ORAL

## 2014-04-27 NOTE — Patient Instructions (Signed)

## 2014-05-18 ENCOUNTER — Other Ambulatory Visit: Payer: Medicare Other

## 2014-05-18 ENCOUNTER — Ambulatory Visit (HOSPITAL_BASED_OUTPATIENT_CLINIC_OR_DEPARTMENT_OTHER): Payer: Medicare Other

## 2014-05-18 ENCOUNTER — Other Ambulatory Visit: Payer: Self-pay | Admitting: Oncology

## 2014-05-18 DIAGNOSIS — C50519 Malignant neoplasm of lower-outer quadrant of unspecified female breast: Secondary | ICD-10-CM

## 2014-05-18 DIAGNOSIS — Z5112 Encounter for antineoplastic immunotherapy: Secondary | ICD-10-CM

## 2014-05-18 DIAGNOSIS — C50919 Malignant neoplasm of unspecified site of unspecified female breast: Secondary | ICD-10-CM

## 2014-05-18 DIAGNOSIS — C50511 Malignant neoplasm of lower-outer quadrant of right female breast: Secondary | ICD-10-CM

## 2014-05-18 MED ORDER — ACETAMINOPHEN 325 MG PO TABS
ORAL_TABLET | ORAL | Status: AC
  Filled 2014-05-18: qty 2

## 2014-05-18 MED ORDER — SODIUM CHLORIDE 0.9 % IJ SOLN
10.0000 mL | INTRAMUSCULAR | Status: DC | PRN
  Administered 2014-05-18: 10 mL
  Filled 2014-05-18: qty 10

## 2014-05-18 MED ORDER — ACETAMINOPHEN 325 MG PO TABS
650.0000 mg | ORAL_TABLET | Freq: Once | ORAL | Status: AC
  Administered 2014-05-18: 650 mg via ORAL

## 2014-05-18 MED ORDER — SODIUM CHLORIDE 0.9 % IV SOLN
Freq: Once | INTRAVENOUS | Status: AC
  Administered 2014-05-18: 15:00:00 via INTRAVENOUS

## 2014-05-18 MED ORDER — TRASTUZUMAB CHEMO INJECTION 440 MG
6.0000 mg/kg | Freq: Once | INTRAVENOUS | Status: AC
  Administered 2014-05-18: 483 mg via INTRAVENOUS
  Filled 2014-05-18: qty 23

## 2014-05-18 MED ORDER — HEPARIN SOD (PORK) LOCK FLUSH 100 UNIT/ML IV SOLN
500.0000 [IU] | Freq: Once | INTRAVENOUS | Status: AC | PRN
  Administered 2014-05-18: 500 [IU]
  Filled 2014-05-18: qty 5

## 2014-06-02 ENCOUNTER — Ambulatory Visit (HOSPITAL_COMMUNITY)
Admission: RE | Admit: 2014-06-02 | Discharge: 2014-06-02 | Disposition: A | Discharge status: Home / Self Care | Payer: Medicare Other | Source: Ambulatory Visit | Attending: Oncology | Admitting: Oncology

## 2014-06-02 DIAGNOSIS — C50511 Malignant neoplasm of lower-outer quadrant of right female breast: Secondary | ICD-10-CM | POA: Diagnosis present

## 2014-06-02 DIAGNOSIS — I369 Nonrheumatic tricuspid valve disorder, unspecified: Secondary | ICD-10-CM

## 2014-06-02 NOTE — Progress Notes (Signed)
Echocardiogram 2D Echocardiogram limited has been performed.  [name] [name] 06/02/2014, 12:52 PM

## 2014-06-07 ENCOUNTER — Other Ambulatory Visit: Payer: Self-pay | Admitting: Emergency Medicine

## 2014-06-07 DIAGNOSIS — C50511 Malignant neoplasm of lower-outer quadrant of right female breast: Secondary | ICD-10-CM

## 2014-06-08 ENCOUNTER — Ambulatory Visit (HOSPITAL_BASED_OUTPATIENT_CLINIC_OR_DEPARTMENT_OTHER): Payer: Medicare Other

## 2014-06-08 ENCOUNTER — Other Ambulatory Visit (HOSPITAL_BASED_OUTPATIENT_CLINIC_OR_DEPARTMENT_OTHER): Payer: Medicare Other

## 2014-06-08 VITALS — BP 134/54 | HR 75 | Temp 98.4°F | Resp 18

## 2014-06-08 DIAGNOSIS — C50919 Malignant neoplasm of unspecified site of unspecified female breast: Secondary | ICD-10-CM

## 2014-06-08 DIAGNOSIS — C50511 Malignant neoplasm of lower-outer quadrant of right female breast: Secondary | ICD-10-CM

## 2014-06-08 DIAGNOSIS — Z5112 Encounter for antineoplastic immunotherapy: Secondary | ICD-10-CM

## 2014-06-08 LAB — CBC WITH DIFFERENTIAL/PLATELET
BASO%: 0.8 % (ref 0.0–2.0)
BASOS ABS: 0.1 10*3/uL (ref 0.0–0.1)
EOS%: 1.9 % (ref 0.0–7.0)
Eosinophils Absolute: 0.2 10*3/uL (ref 0.0–0.5)
HCT: 34.6 % — ABNORMAL LOW (ref 34.8–46.6)
HEMOGLOBIN: 11.1 g/dL — AB (ref 11.6–15.9)
LYMPH%: 57.3 % — ABNORMAL HIGH (ref 14.0–49.7)
MCH: 26.7 pg (ref 25.1–34.0)
MCHC: 32.1 g/dL (ref 31.5–36.0)
MCV: 83.1 fL (ref 79.5–101.0)
MONO#: 0.7 10*3/uL (ref 0.1–0.9)
MONO%: 5.6 % (ref 0.0–14.0)
NEUT#: 4.2 10*3/uL (ref 1.5–6.5)
NEUT%: 34.4 % — ABNORMAL LOW (ref 38.4–76.8)
Platelets: 224 10*3/uL (ref 145–400)
RBC: 4.16 10*6/uL (ref 3.70–5.45)
RDW: 15 % — ABNORMAL HIGH (ref 11.2–14.5)
WBC: 12.3 10*3/uL — ABNORMAL HIGH (ref 3.9–10.3)
lymph#: 7 10*3/uL — ABNORMAL HIGH (ref 0.9–3.3)

## 2014-06-08 LAB — COMPREHENSIVE METABOLIC PANEL (CC13)
ALBUMIN: 4.3 g/dL (ref 3.5–5.0)
ALK PHOS: 83 U/L (ref 40–150)
ALT: 26 U/L (ref 0–55)
AST: 25 U/L (ref 5–34)
Anion Gap: 10 mEq/L (ref 3–11)
BILIRUBIN TOTAL: 0.29 mg/dL (ref 0.20–1.20)
BUN: 17.3 mg/dL (ref 7.0–26.0)
CO2: 24 mEq/L (ref 22–29)
Calcium: 9.9 mg/dL (ref 8.4–10.4)
Chloride: 101 mEq/L (ref 98–109)
Creatinine: 1.1 mg/dL (ref 0.6–1.1)
Glucose: 94 mg/dl (ref 70–140)
POTASSIUM: 5.1 meq/L (ref 3.5–5.1)
Sodium: 134 mEq/L — ABNORMAL LOW (ref 136–145)
Total Protein: 6.7 g/dL (ref 6.4–8.3)

## 2014-06-08 LAB — TECHNOLOGIST REVIEW

## 2014-06-08 MED ORDER — HEPARIN SOD (PORK) LOCK FLUSH 100 UNIT/ML IV SOLN
500.0000 [IU] | Freq: Once | INTRAVENOUS | Status: AC | PRN
  Administered 2014-06-08: 500 [IU]
  Filled 2014-06-08: qty 5

## 2014-06-08 MED ORDER — SODIUM CHLORIDE 0.9 % IV SOLN
Freq: Once | INTRAVENOUS | Status: AC
  Administered 2014-06-08: 14:00:00 via INTRAVENOUS

## 2014-06-08 MED ORDER — ACETAMINOPHEN 325 MG PO TABS
ORAL_TABLET | ORAL | Status: AC
  Filled 2014-06-08: qty 2

## 2014-06-08 MED ORDER — SODIUM CHLORIDE 0.9 % IJ SOLN
10.0000 mL | INTRAMUSCULAR | Status: DC | PRN
  Administered 2014-06-08: 10 mL
  Filled 2014-06-08: qty 10

## 2014-06-08 MED ORDER — TRASTUZUMAB CHEMO INJECTION 440 MG
6.0000 mg/kg | Freq: Once | INTRAVENOUS | Status: AC
  Administered 2014-06-08: 483 mg via INTRAVENOUS
  Filled 2014-06-08: qty 23

## 2014-06-08 MED ORDER — ACETAMINOPHEN 325 MG PO TABS
650.0000 mg | ORAL_TABLET | Freq: Once | ORAL | Status: AC
  Administered 2014-06-08: 650 mg via ORAL

## 2014-06-08 NOTE — Patient Instructions (Signed)

## 2014-06-09 ENCOUNTER — Ambulatory Visit: Payer: Medicare Other | Admitting: Gastroenterology

## 2014-06-12 ENCOUNTER — Telehealth: Payer: Self-pay | Admitting: Family Medicine

## 2014-06-12 ENCOUNTER — Encounter: Payer: Self-pay | Admitting: Family Medicine

## 2014-06-12 MED ORDER — CETIRIZINE HCL 10 MG PO TABS: 10.0000 mg | ORAL_TABLET | Freq: Every day | ORAL | Status: DC

## 2014-06-12 NOTE — Telephone Encounter (Signed)
Medication refill for one time only.  Patient needs to be seen.  Letter sent for patient to call and schedule 

## 2014-06-14 ENCOUNTER — Other Ambulatory Visit: Payer: Self-pay | Admitting: *Deleted

## 2014-06-14 DIAGNOSIS — C50511 Malignant neoplasm of lower-outer quadrant of right female breast: Secondary | ICD-10-CM

## 2014-06-15 ENCOUNTER — Other Ambulatory Visit (HOSPITAL_BASED_OUTPATIENT_CLINIC_OR_DEPARTMENT_OTHER): Payer: Medicare Other

## 2014-06-15 ENCOUNTER — Telehealth: Payer: Self-pay | Admitting: Oncology

## 2014-06-15 ENCOUNTER — Ambulatory Visit (HOSPITAL_BASED_OUTPATIENT_CLINIC_OR_DEPARTMENT_OTHER): Payer: Medicare Other | Admitting: Oncology

## 2014-06-15 VITALS — BP 136/84 | HR 84 | Temp 98.8°F | Resp 18 | Ht 64.75 in | Wt 176.4 lb

## 2014-06-15 DIAGNOSIS — Z856 Personal history of leukemia: Secondary | ICD-10-CM

## 2014-06-15 DIAGNOSIS — C50511 Malignant neoplasm of lower-outer quadrant of right female breast: Secondary | ICD-10-CM

## 2014-06-15 DIAGNOSIS — C50911 Malignant neoplasm of unspecified site of right female breast: Secondary | ICD-10-CM

## 2014-06-15 DIAGNOSIS — D649 Anemia, unspecified: Secondary | ICD-10-CM

## 2014-06-15 LAB — CBC WITH DIFFERENTIAL/PLATELET
BASO%: 0.6 % (ref 0.0–2.0)
BASOS ABS: 0.1 10*3/uL (ref 0.0–0.1)
EOS%: 2.2 % (ref 0.0–7.0)
Eosinophils Absolute: 0.3 10*3/uL (ref 0.0–0.5)
HEMATOCRIT: 36.5 % (ref 34.8–46.6)
HEMOGLOBIN: 11.9 g/dL (ref 11.6–15.9)
LYMPH%: 53.8 % — ABNORMAL HIGH (ref 14.0–49.7)
MCH: 27.5 pg (ref 25.1–34.0)
MCHC: 32.6 g/dL (ref 31.5–36.0)
MCV: 84.3 fL (ref 79.5–101.0)
MONO#: 1.3 10*3/uL — ABNORMAL HIGH (ref 0.1–0.9)
MONO%: 10 % (ref 0.0–14.0)
NEUT#: 4.2 10*3/uL (ref 1.5–6.5)
NEUT%: 33.4 % — ABNORMAL LOW (ref 38.4–76.8)
Platelets: 197 10*3/uL (ref 145–400)
RBC: 4.33 10*6/uL (ref 3.70–5.45)
RDW: 14.3 % (ref 11.2–14.5)
WBC: 12.6 10*3/uL — ABNORMAL HIGH (ref 3.9–10.3)
lymph#: 6.8 10*3/uL — ABNORMAL HIGH (ref 0.9–3.3)

## 2014-06-15 LAB — COMPREHENSIVE METABOLIC PANEL (CC13)
ALT: 101 U/L — ABNORMAL HIGH (ref 0–55)
AST: 78 U/L — AB (ref 5–34)
Albumin: 4 g/dL (ref 3.5–5.0)
Alkaline Phosphatase: 94 U/L (ref 40–150)
Anion Gap: 9 mEq/L (ref 3–11)
BUN: 13.8 mg/dL (ref 7.0–26.0)
CO2: 26 mEq/L (ref 22–29)
CREATININE: 0.9 mg/dL (ref 0.6–1.1)
Calcium: 9.9 mg/dL (ref 8.4–10.4)
Chloride: 100 mEq/L (ref 98–109)
Glucose: 100 mg/dl (ref 70–140)
Potassium: 4.3 mEq/L (ref 3.5–5.1)
Sodium: 135 mEq/L — ABNORMAL LOW (ref 136–145)
Total Bilirubin: <0.2 mg/dL (ref 0.20–1.20)
Total Protein: 6.7 g/dL (ref 6.4–8.3)

## 2014-06-15 LAB — TECHNOLOGIST REVIEW

## 2014-06-15 NOTE — Telephone Encounter (Signed)
[facility] APPT SCHEDULE FOR NOV 2015 THRU JAN 2016. ECHO TO [place]. PT AWARE SHE WILL BE CONTACTED W/ECHO.

## 2014-06-15 NOTE — Progress Notes (Signed)
ID: [name] OB: 11/04/1945  MR#: [id number]  XBW#:[id number]  PCP: [name], MD GYN:  M. 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 performed a right lumpectomy and axillary lymph node dissection for what according to the patient and her family was a stage I invasive ductal breast cancer. She received radiation adjuvantly and then took tamoxifen for 5 years  In February of 2014 she had a normal screening mammography, but in November 2014 she felt a "hard lump" in her right breast. She brought this to the attention of her urologist, Dr. Izora Gala, and he set her up for bilateral diagnostic mammography with mammography at [facility] 07/13/2013. This showed her breast density to be category B. There was no mammographic abnormality noted but ultrasonography showed a 1.4 cm irregularly shaped solid mass in the right breast at the 8:00 position. This was biopsied the same day, and showed ([id number]) an invasive ductal carcinoma, grade 2, estrogen receptor 100% positive, progesterone receptor 13% positive, with an MIB-1 of 33% and HER-2 amplification with a HER-2: CEP 17 ratio of 3.19, and a HER-2 copy number percent all of 4.15.  The patient's subsequent history is as detailed below  INTERVAL HISTORY: Shirl returns today for followup of her recurrent right breast cancer accompanied by her husband Arnette Norris. She continues on anastrozole, with good tolerance. Hot flashes are present, but no other major bother. She does have vaginal dryness issues but again it is not a major concern. She tolerates the trastuzumab with no side effects except that perhaps a week later she has  one or 2 days with extra loose bowel movements. Maximum 3 bowel movements. She sometimes takes Imodium but most of the time she does not bother. "They stop all by themselves".   REVIEW OF SYSTEMS: She used to take walks around the perimeter of her property but ran into some Fox's and some Harriett Sine she is afraid of rabies. There is no gym or Y. near where they live. She doesn't enjoy walking anyway. She really does not like exercise even though she knows it would be good for her fibromyalgia and her weight concerns. She feels a little forgetful. She is anxious, but denies depression. She has mild hearing loss. Sometimes she has palpitations. She keeps a dry cough especially this time of year. A detailed review of systems overall is unchanged  PAST MEDICAL HISTORY: Past Medical History  Diagnosis Date  . Sinus tachycardia     mild resting  . HLD (hyperlipidemia)   . Orthostatic hypotension     slight in the past  . Anxiety   . Depression   . Diverticulosis   . Fibromyalgia 1978  . Hypothyroidism   . Alopecia   . Ruptured disk     one in neck and two in back  . Plantar fasciitis   . GERD (gastroesophageal reflux disease)   . History of syncope     episode 2008--  no recurrence since  . Ureteral calculi     bilateral  . Leukocytosis   . Chronic back pain     "lower back and upper neck" (07/28/2013)  . 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 ear   .  Urgency of urination   . Hematuria   . Sensation of pressure in bladder area   . Wears glasses   . Ecchymosis     FROM IV AND LAB WORK THE PAST WEEK  06-17-2013  . Stool incontinence     "at times recently"   . Kidney stones 2014  . CAP (community acquired pneumonia)     admission 06-04-2013 secondary to failed oral antibitotics---  last cxr 06-16-2013  much improved  . SOB (shortness of breath) 06-17-2013 CURRENTLY RESIDUAL FROM RECENT CAP    SOB from obesity and deconditioning 2009- eval included CPX  . Arthritis     "spine" (07/28/2013)  . DDD  (degenerative disc disease)   . CLL (chronic lymphocytic leukemia) dx'd 05/2013    "I don't think I have it though" (07/28/2013)  . Breast cancer 1997; 2014    right    PAST SURGICAL HISTORY: Past Surgical History  Procedure Laterality Date  . Lumbar epidural injection      has had 7 injections  . Total abdominal hysterectomy w/ bilateral salpingoophorectomy  1997  . Breast lumpectomy with axillary lymph node dissection Right 1997  . Transthoracic echocardiogram  05-11-2007  dr Ron Parker    normal lvf/  ef 65%  . Tonsillectomy  AGE 45  . Retinal detachment surgery Right 2013  . Cystoscopy with retrograde pyelogram, ureteroscopy and stent placement Bilateral 06/17/2013    Procedure: CYSTOSCOPY WITH RETROGRADE PYELOGRAM, URETEROSCOPY AND LEFT DOUBLE  J STENT PLACEMENT RIGHT URETERAL HOLMIIUM LASER AND DOUBLE J STENT ;  Surgeon: Hanley Ben, MD;  Location: [place];  Service: Urology;  Laterality: Bilateral;  . Holmium laser application Bilateral 81/77/1165    Procedure: HOLMIUM LASER APPLICATION;  Surgeon: Hanley Ben, MD;  Location: [place];  Service: Urology;  Laterality: Bilateral;  . Mastectomy w/ nodes partial Right 1997  . Mastectomy complete / simple Right 07/28/2013  . Appendectomy  1997  . Breast biopsy Right 2014  . Cystoscopy with ureteroscopy, stone basketry and stent placement Bilateral 2014  . Lithotripsy Left ~ 06/2013  . Simple mastectomy with axillary sentinel node biopsy Right 07/28/2013    Procedure: RIGHT TOTAL  MASTECTOMY;  Surgeon: Imogene Burn. Georgette Dover, MD;  Location: [place];  Service: General;  Laterality: Right;  . Portacath placement Left 07/28/2013    Procedure: ATTEMPTED INSERTION PORT-A-CATH;  Surgeon: Imogene Burn. Georgette Dover, MD;  Location: [place] [facility];  Service: General;  Laterality: Left;    FAMILY HISTORY Family History  Problem Relation Age of Onset  . Heart disease Maternal Grandfather   . Diabetes Maternal Grandfather   . Colon  cancer Maternal Aunt 79  . Brain cancer Paternal Grandmother     dx in 45s  . Dementia Mother   . Diabetes Mother   . Osteoporosis Mother   . Diabetes Maternal Aunt   . Prostate cancer Father 80  . Bipolar disorder Maternal Aunt   . Stroke Maternal Aunt   . Stomach cancer Paternal Uncle     dx in late 89s   the patient's mother died at the age of 5. The patient's father is alive at age 53. She had no brothers or sisters. Her father has a history of prostate cancer. There is no history of breast or ovarian cancer in the family. One maternal first cousin has a history of Hodgkin's lymphoma.  GYNECOLOGIC HISTORY:  Menarche age 55, first live birth age 29. She is GX P1. She underwent total abdominal hysterectomy and  bilateral salpingo-oophorectomy in 1997 she did not take hormone replacement.  SOCIAL HISTORY: (Updated  11/15/2013)  She is a homemaker. Her husband Arnette Norris farms approximately 500 acres. Daughter Colletta [place] lives in [place] where she works as a Pension scheme manager for a drug company. The patient has no grandchildren. She has two "grand cats". She attends a local united church of [place]: Not in place   HEALTH MAINTENANCE:  (Updated   11/15/2013 ) History  Substance Use Topics  . Smoking status: Never Smoker   . Smokeless tobacco: Never Used  . Alcohol Use: No     Comment: 07/28/2013 "no alcohol since 1994; never had problem w/it"     Colonoscopy: 2009/ [facility]  PAP: Status post hysterectomy  Bone density: May 10/14/2009 at [facility] was normal  Lipid panel:  Not on file   Allergies  Allergen Reactions  . Amoxicillin Itching  . Lasix [Furosemide] Nausea And Vomiting and Other (See Comments)    headache  . Lithium Other (See Comments)    Dizziness, "cause me to fall"  . Sulfa Antibiotics Hives  . Trazodone And Nefazodone Other (See Comments)    insomnia  . Lyrica [Pregabalin] Diarrhea, Nausea Only and Rash    Current  Outpatient Prescriptions  Medication Sig Dispense Refill  . ALPRAZolam (XANAX) 0.5 MG tablet TAKE 1 TABLET EVERY 8 HOURS AS NEEDED  30 tablet  0  . amitriptyline (ELAVIL) 150 MG tablet Take 150 mg by mouth at bedtime.      Marland Kitchen anastrozole (ARIMIDEX) 1 MG tablet TAKE 1 TABLET (1 MG TOTAL) BY MOUTH DAILY.  90 tablet  0  . Ascorbic Acid (VITAMIN C) 1000 MG tablet Take 1,000 mg by mouth daily.      Marland Kitchen buPROPion (WELLBUTRIN SR) 150 MG 12 hr tablet Take 150-300 mg by mouth 2 (two) times daily. Take 382m in the morning and 1551mat 2pm      . cetirizine (ZYRTEC) 10 MG tablet Take 1 tablet (10 mg total) by mouth daily.  30 tablet  0  . Cholecalciferol (VITAMIN D-3) 1000 UNITS CAPS Take 5,000 Units by mouth daily.      . ciprofloxacin (CIPRO) 250 MG tablet Take 1 tablet (250 mg total) by mouth 2 (two) times daily.  10 tablet  0  . ibuprofen (ADVIL,MOTRIN) 200 MG tablet Take 800 mg by mouth every 6 (six) hours as needed for moderate pain.      . Marland Kitchenevothyroxine (SYNTHROID, LEVOTHROID) 50 MCG tablet Take 1 tablet (50 mcg total) by mouth daily before breakfast.  90 tablet  3  . lidocaine-prilocaine (EMLA) cream APPLY 1 APPLICATION TOPICALLY AS NEEDED.  30 g  3  . metoprolol succinate (TOPROL-XL) 25 MG 24 hr tablet Take 1 tablet (25 mg total) by mouth daily.  30 tablet  5  . minoxidil (ROGAINE) 2 % external solution Apply 1 application topically 2 (two) times daily as needed.       . Multiple Vitamins-Minerals (HAIR VITAMINS PO) Take 1 tablet by mouth 2 (two) times daily.      . Omega-3 Fatty Acids (FISH OIL PO) Take 1 capsule by mouth daily.       . Marland Kitchenmeprazole (PRILOSEC) 40 MG capsule Take 1 capsule (40 mg total) by mouth every morning.  90 capsule  0  . Oxcarbazepine (TRILEPTAL) 300 MG tablet Take 300 mg by mouth 2 (two) times daily.      . Marland KitchenxyCODONE-acetaminophen (PERCOCET/ROXICET) 5-325 MG per tablet Take 1 tablet  by mouth every 4 (four) hours as needed for moderate pain.  40 tablet  0  . Vilazodone HCl  (VIIBRYD) 20 MG TABS Take 1 tablet by mouth daily.       No current facility-administered medications for this visit.    OBJECTIVE: 68 year old white woman who appears stated age 68 Vitals:   06/15/14 1055  BP: 136/84  Pulse: 84  Temp: 98.8 F (37.1 C)  Resp: 18     Body mass index is 29.57 kg/(m^2).    ECOG FS:1 - Symptomatic but completely ambulatory Filed Weights   06/15/14 1055  Weight: 176 lb 6.4 oz (80.015 kg)   Sclerae unicteric, pupils round and equal Oropharynx clear, teeth in good repair No cervical or supraclavicular adenopathy, no axillary adenopathy Lungs no rales or rhonchi Heart regular rate and rhythm Abd soft, nontender, positive bowel sounds, no masses palpated MSK no focal spinal tenderness, no upper extremity lymphedema Neuro: nonfocal, well oriented, appropriate affect Breasts: The right breast is status post mastectomy. There is no evidence of local recurrence. The right axilla is benign. Left breast is unremarkable.  LAB RESULTS:   Lab Results  Component Value Date   WBC 12.6* 06/15/2014   NEUTROABS 4.2 06/15/2014   HGB 11.9 06/15/2014   HCT 36.5 06/15/2014   MCV 84.3 06/15/2014   PLT 197 06/15/2014      Chemistry      Component Value Date/Time   NA 135* 06/15/2014 1025   NA 134* 04/11/2014 1336   K 4.3 06/15/2014 1025   K 4.2 04/11/2014 1336   CL 100 04/11/2014 1336   CO2 26 06/15/2014 1025   CO2 25 04/11/2014 1336   BUN 13.8 06/15/2014 1025   BUN 14 04/11/2014 1336   CREATININE 0.9 06/15/2014 1025   CREATININE 0.95 04/11/2014 1336   CREATININE 0.76 07/30/2013 0422      Component Value Date/Time   CALCIUM 9.9 06/15/2014 1025   CALCIUM 8.9 04/11/2014 1336   ALKPHOS 94 06/15/2014 1025   ALKPHOS 95 04/11/2014 1336   AST 78* 06/15/2014 1025   AST 30 04/11/2014 1336   ALT 101* 06/15/2014 1025   ALT 37* 04/11/2014 1336   BILITOT <0.20 06/15/2014 1025   BILITOT 0.6 04/11/2014 1336       STUDIES: ------------------------------------------------------------------- Transthoracic Echocardiography  Patient: Lataya, Varnell MR #: [id number] Study Date: 06/02/2014 Gender: F Age: 8 Height: 165.1 cm Weight: 74.8 kg BSA: 1.87 m^2 Pt. Status: Room:  ATTENDING Olayinka Gathers, Virgie Dad REFERRING Daub, [place], RDCS ORDERING Cornetto, Cleaster Corin REFERRING Cornetto, Cleaster Corin PERFORMING Chmg, Outpatient  cc:  ------------------------------------------------------------------- LV EF: 54%  ----------------------   ASSESSMENT: 68 y.o. BRCA negative [place] woman  (1) status post right lumpectomy and axillary lymph node dissection in 1997 for a stage I breast cancer, treated with adjuvant radiation and tamoxifen for 5 years  (2) status post right breast biopsy 07/13/2013 for a clinical T1c N0, stage IA invasive ductal carcinoma, grade 2, estrogen receptor 100% positive, progesterone receptor 13% positive, with an MIB-1 of 33%, and HER-2 amplification by CISH with a HER2/CEP 17 ratio of 3.19, and an average HER-2 copy number per cell of 4.15  (3) status post right mastectomy 07/28/2013 for a pT1c pN0, stage IA invasive ductal carcinoma, grade 3, with close but negative margins. Prognostic panel was not repeated  (4) completed weekly paclitaxel x12 12/06/2913, with trastuzumab/ pertuzumab every 3 weeks; pertuzumab was held with third dose on 11/01/2013 due to diarrhea,  tried a half dose on cycle 4 again with diarrhea developing  (5) trastuzumab (started 09/20/2013) to be continued for 1 year; most recent echocardiogram on 06/02/2014  shows a well preserved ejection fraction  (6) anastrozole started May 2015; bone density 12/15/2013 normal   OTHER PROBLEMS:  (a) History of chronic lymphoid leukemia diagnosed by flow cytometry 06/29/2013, the cells being CD5, CD20 and CD23 positive, CD10 negative.   (b) Anemia with a normal MCV and normal  ferritin-- B-12 and folate normal  (c) the patient met with Dr. Harlow Mares and has decided against reconstruction   PLAN: Luva is doing very well from a breast cancer point of view and will complete her Herceptin treatments late January. She'll have one more echo which shortly before that. The plan as far as the anastrozole is concern is to continue that for 5 years.   Her liver function tests are up a little. This is most likely due to medication. However it is a new finding. If it persists within next set of labs we will obtain an ultrasound of the liver. However CT scan of the abdomen 04/11/2014 showed no acute abnormalities. Otherwise however a noncontrast study.  She has sought reconstruction and has also thought of contralateral mastectomies. [place] she has decided against both of [place]. Paragraph I think she would benefit from regular exercise. Since there is not placed nearby that she could easily do this perhaps she could get some tapes and exercise at home. I think this would help her mood, her fibromyalgia, and also a cyst in her attempt at losing weight.  She is going to see me again late January. She knows to call for any problems that may develop before that visit. At that point we will initiate long-term followup.   Chauncey Cruel, MD    06/15/2014 11:24 AM

## 2014-06-16 ENCOUNTER — Telehealth: Payer: Self-pay | Admitting: Oncology

## 2014-06-16 NOTE — Telephone Encounter (Signed)
per linda preauth for echo [id number]. lmonvm for pt re echo for 09/05/14 and pt to get new schedule when she comes in 11/5. also confirmed 11/5 appt. message to [place] re echo being jan 2016 to see if this affect preauth number.

## 2014-06-16 NOTE — Addendum Note (Signed)
Addended by: Laureen Abrahams on: 06/16/2014 06:08 PM   Modules accepted: Orders

## 2014-06-26 ENCOUNTER — Encounter: Payer: Self-pay | Admitting: Obstetrics & Gynecology

## 2014-06-29 ENCOUNTER — Other Ambulatory Visit (HOSPITAL_BASED_OUTPATIENT_CLINIC_OR_DEPARTMENT_OTHER): Payer: Medicare Other

## 2014-06-29 ENCOUNTER — Telehealth: Payer: Self-pay | Admitting: Oncology

## 2014-06-29 ENCOUNTER — Ambulatory Visit (HOSPITAL_BASED_OUTPATIENT_CLINIC_OR_DEPARTMENT_OTHER): Payer: Medicare Other

## 2014-06-29 DIAGNOSIS — Z5112 Encounter for antineoplastic immunotherapy: Secondary | ICD-10-CM

## 2014-06-29 DIAGNOSIS — C50511 Malignant neoplasm of lower-outer quadrant of right female breast: Secondary | ICD-10-CM

## 2014-06-29 DIAGNOSIS — C50911 Malignant neoplasm of unspecified site of right female breast: Secondary | ICD-10-CM

## 2014-06-29 DIAGNOSIS — C50919 Malignant neoplasm of unspecified site of unspecified female breast: Secondary | ICD-10-CM

## 2014-06-29 LAB — CBC WITH DIFFERENTIAL/PLATELET
BASO%: 1 % (ref 0.0–2.0)
Basophils Absolute: 0.2 10*3/uL — ABNORMAL HIGH (ref 0.0–0.1)
EOS ABS: 0.5 10*3/uL (ref 0.0–0.5)
EOS%: 3.1 % (ref 0.0–7.0)
HCT: 34.4 % — ABNORMAL LOW (ref 34.8–46.6)
HGB: 11 g/dL — ABNORMAL LOW (ref 11.6–15.9)
LYMPH#: 8.7 10*3/uL — AB (ref 0.9–3.3)
LYMPH%: 60 % — ABNORMAL HIGH (ref 14.0–49.7)
MCH: 27 pg (ref 25.1–34.0)
MCHC: 31.9 g/dL (ref 31.5–36.0)
MCV: 84.8 fL (ref 79.5–101.0)
MONO#: 0.8 10*3/uL (ref 0.1–0.9)
MONO%: 5.5 % (ref 0.0–14.0)
NEUT%: 30.4 % — ABNORMAL LOW (ref 38.4–76.8)
NEUTROS ABS: 4.4 10*3/uL (ref 1.5–6.5)
PLATELETS: 251 10*3/uL (ref 145–400)
RBC: 4.05 10*6/uL (ref 3.70–5.45)
RDW: 14.5 % (ref 11.2–14.5)
WBC: 14.5 10*3/uL — AB (ref 3.9–10.3)

## 2014-06-29 LAB — COMPREHENSIVE METABOLIC PANEL (CC13)
ALBUMIN: 4 g/dL (ref 3.5–5.0)
ALT: 38 U/L (ref 0–55)
ANION GAP: 8 meq/L (ref 3–11)
AST: 26 U/L (ref 5–34)
Alkaline Phosphatase: 91 U/L (ref 40–150)
BUN: 13.9 mg/dL (ref 7.0–26.0)
CALCIUM: 9.9 mg/dL (ref 8.4–10.4)
CHLORIDE: 107 meq/L (ref 98–109)
CO2: 28 meq/L (ref 22–29)
Creatinine: 1.1 mg/dL (ref 0.6–1.1)
GLUCOSE: 94 mg/dL (ref 70–140)
POTASSIUM: 4.3 meq/L (ref 3.5–5.1)
SODIUM: 143 meq/L (ref 136–145)
TOTAL PROTEIN: 6.4 g/dL (ref 6.4–8.3)
Total Bilirubin: <0.2 mg/dL (ref 0.20–1.20)

## 2014-06-29 LAB — TECHNOLOGIST REVIEW

## 2014-06-29 MED ORDER — SODIUM CHLORIDE 0.9 % IJ SOLN
10.0000 mL | INTRAMUSCULAR | Status: DC | PRN
Start: 2014-06-29 — End: 2014-06-29
  Administered 2014-06-29: 10 mL
  Filled 2014-06-29: qty 10

## 2014-06-29 MED ORDER — SODIUM CHLORIDE 0.9 % IV SOLN
Freq: Once | INTRAVENOUS | Status: AC
  Administered 2014-06-29: 14:00:00 via INTRAVENOUS

## 2014-06-29 MED ORDER — ACETAMINOPHEN 325 MG PO TABS
650.0000 mg | ORAL_TABLET | Freq: Once | ORAL | Status: AC
  Administered 2014-06-29: 650 mg via ORAL

## 2014-06-29 MED ORDER — TRASTUZUMAB CHEMO INJECTION 440 MG
6.0000 mg/kg | Freq: Once | INTRAVENOUS | Status: AC
  Administered 2014-06-29: 483 mg via INTRAVENOUS
  Filled 2014-06-29: qty 23

## 2014-06-29 MED ORDER — HEPARIN SOD (PORK) LOCK FLUSH 100 UNIT/ML IV SOLN
500.0000 [IU] | Freq: Once | INTRAVENOUS | Status: AC | PRN
  Administered 2014-06-29: 500 [IU]
  Filled 2014-06-29: qty 5

## 2014-06-29 MED ORDER — ACETAMINOPHEN 325 MG PO TABS
ORAL_TABLET | ORAL | Status: AC
  Filled 2014-06-29: qty 2

## 2014-06-29 NOTE — Patient Instructions (Signed)
[facility] Discharge Instructions for Patients Receiving Chemotherapy  Today you received the following chemotherapy agents herceptin   To help prevent nausea and vomiting after your treatment, we encourage you to take your nausea medication as directed   If you develop nausea and vomiting that is not controlled by your nausea medication, call the clinic.   BELOW ARE SYMPTOMS THAT SHOULD BE REPORTED IMMEDIATELY:  *FEVER GREATER THAN 100.5 F  *CHILLS WITH OR WITHOUT FEVER  NAUSEA AND VOMITING THAT IS NOT CONTROLLED WITH YOUR NAUSEA MEDICATION  *UNUSUAL SHORTNESS OF BREATH  *UNUSUAL BRUISING OR BLEEDING  TENDERNESS IN MOUTH AND THROAT WITH OR WITHOUT PRESENCE OF ULCERS  *URINARY PROBLEMS  *BOWEL PROBLEMS  UNUSUAL RASH Items with * indicate a potential emergency and should be followed up as soon as possible.  Feel free to call the clinic you have any questions or concerns. The clinic phone number is [phone].  

## 2014-06-29 NOTE — Telephone Encounter (Signed)
echo note due/scheduled until jan 2016. per 2nd response from pre auth - #[id number] 06/15/14 thru 12/16/14.

## 2014-07-21 ENCOUNTER — Ambulatory Visit (HOSPITAL_BASED_OUTPATIENT_CLINIC_OR_DEPARTMENT_OTHER): Payer: Medicare Other

## 2014-07-21 ENCOUNTER — Other Ambulatory Visit (HOSPITAL_BASED_OUTPATIENT_CLINIC_OR_DEPARTMENT_OTHER): Payer: Medicare Other

## 2014-07-21 DIAGNOSIS — C50511 Malignant neoplasm of lower-outer quadrant of right female breast: Secondary | ICD-10-CM

## 2014-07-21 DIAGNOSIS — C50919 Malignant neoplasm of unspecified site of unspecified female breast: Secondary | ICD-10-CM

## 2014-07-21 DIAGNOSIS — Z5112 Encounter for antineoplastic immunotherapy: Secondary | ICD-10-CM

## 2014-07-21 DIAGNOSIS — C50911 Malignant neoplasm of unspecified site of right female breast: Secondary | ICD-10-CM

## 2014-07-21 LAB — COMPREHENSIVE METABOLIC PANEL (CC13)
ALK PHOS: 98 U/L (ref 40–150)
ALT: 22 U/L (ref 0–55)
AST: 20 U/L (ref 5–34)
Albumin: 3.8 g/dL (ref 3.5–5.0)
Anion Gap: 10 mEq/L (ref 3–11)
BILIRUBIN TOTAL: 0.2 mg/dL (ref 0.20–1.20)
BUN: 16 mg/dL (ref 7.0–26.0)
CO2: 26 mEq/L (ref 22–29)
CREATININE: 1 mg/dL (ref 0.6–1.1)
Calcium: 9.4 mg/dL (ref 8.4–10.4)
Chloride: 107 mEq/L (ref 98–109)
Glucose: 106 mg/dl (ref 70–140)
Potassium: 4.7 mEq/L (ref 3.5–5.1)
SODIUM: 143 meq/L (ref 136–145)
TOTAL PROTEIN: 6.2 g/dL — AB (ref 6.4–8.3)

## 2014-07-21 LAB — CBC WITH DIFFERENTIAL/PLATELET
BASO%: 1.1 % (ref 0.0–2.0)
Basophils Absolute: 0.1 10*3/uL (ref 0.0–0.1)
EOS%: 3.9 % (ref 0.0–7.0)
Eosinophils Absolute: 0.4 10*3/uL (ref 0.0–0.5)
HCT: 34.2 % — ABNORMAL LOW (ref 34.8–46.6)
HGB: 10.7 g/dL — ABNORMAL LOW (ref 11.6–15.9)
LYMPH%: 54.8 % — AB (ref 14.0–49.7)
MCH: 26.7 pg (ref 25.1–34.0)
MCHC: 31.4 g/dL — AB (ref 31.5–36.0)
MCV: 85 fL (ref 79.5–101.0)
MONO#: 0.8 10*3/uL (ref 0.1–0.9)
MONO%: 7 % (ref 0.0–14.0)
NEUT#: 3.8 10*3/uL (ref 1.5–6.5)
NEUT%: 33.2 % — AB (ref 38.4–76.8)
PLATELETS: 198 10*3/uL (ref 145–400)
RBC: 4.02 10*6/uL (ref 3.70–5.45)
RDW: 14.3 % (ref 11.2–14.5)
WBC: 11.4 10*3/uL — ABNORMAL HIGH (ref 3.9–10.3)
lymph#: 6.2 10*3/uL — ABNORMAL HIGH (ref 0.9–3.3)

## 2014-07-21 MED ORDER — SODIUM CHLORIDE 0.9 % IJ SOLN
10.0000 mL | INTRAMUSCULAR | Status: DC | PRN
  Administered 2014-07-21: 10 mL
  Filled 2014-07-21: qty 10

## 2014-07-21 MED ORDER — ACETAMINOPHEN 325 MG PO TABS
ORAL_TABLET | ORAL | Status: AC
  Filled 2014-07-21: qty 2

## 2014-07-21 MED ORDER — SODIUM CHLORIDE 0.9 % IV SOLN
Freq: Once | INTRAVENOUS | Status: AC
  Administered 2014-07-21: 14:00:00 via INTRAVENOUS

## 2014-07-21 MED ORDER — TRASTUZUMAB CHEMO INJECTION 440 MG
6.0000 mg/kg | Freq: Once | INTRAVENOUS | Status: AC
  Administered 2014-07-21: 483 mg via INTRAVENOUS
  Filled 2014-07-21: qty 23

## 2014-07-21 MED ORDER — ACETAMINOPHEN 325 MG PO TABS
650.0000 mg | ORAL_TABLET | Freq: Once | ORAL | Status: AC
  Administered 2014-07-21: 650 mg via ORAL

## 2014-07-21 MED ORDER — HEPARIN SOD (PORK) LOCK FLUSH 100 UNIT/ML IV SOLN
500.0000 [IU] | Freq: Once | INTRAVENOUS | Status: AC | PRN
  Administered 2014-07-21: 500 [IU]
  Filled 2014-07-21: qty 5

## 2014-07-21 NOTE — Patient Instructions (Signed)

## 2014-07-28 ENCOUNTER — Ambulatory Visit: Payer: Medicare Other

## 2014-07-31 ENCOUNTER — Ambulatory Visit: Payer: Medicare Other | Admitting: Gastroenterology

## 2014-07-31 ENCOUNTER — Telehealth: Payer: Self-pay | Admitting: Gastroenterology

## 2014-07-31 NOTE — Telephone Encounter (Signed)
Do not bill 

## 2014-08-08 ENCOUNTER — Other Ambulatory Visit: Payer: Self-pay | Admitting: Cardiology

## 2014-08-10 ENCOUNTER — Other Ambulatory Visit (HOSPITAL_BASED_OUTPATIENT_CLINIC_OR_DEPARTMENT_OTHER): Payer: Medicare Other

## 2014-08-10 ENCOUNTER — Ambulatory Visit (HOSPITAL_BASED_OUTPATIENT_CLINIC_OR_DEPARTMENT_OTHER): Payer: Medicare Other

## 2014-08-10 DIAGNOSIS — D649 Anemia, unspecified: Secondary | ICD-10-CM

## 2014-08-10 DIAGNOSIS — C50511 Malignant neoplasm of lower-outer quadrant of right female breast: Secondary | ICD-10-CM

## 2014-08-10 DIAGNOSIS — Z5112 Encounter for antineoplastic immunotherapy: Secondary | ICD-10-CM

## 2014-08-10 DIAGNOSIS — C50911 Malignant neoplasm of unspecified site of right female breast: Secondary | ICD-10-CM

## 2014-08-10 DIAGNOSIS — C50919 Malignant neoplasm of unspecified site of unspecified female breast: Secondary | ICD-10-CM

## 2014-08-10 LAB — COMPREHENSIVE METABOLIC PANEL (CC13)
ALK PHOS: 107 U/L (ref 40–150)
ALT: 25 U/L (ref 0–55)
AST: 24 U/L (ref 5–34)
Albumin: 4.1 g/dL (ref 3.5–5.0)
Anion Gap: 11 mEq/L (ref 3–11)
BUN: 16 mg/dL (ref 7.0–26.0)
CO2: 24 mEq/L (ref 22–29)
Calcium: 9.2 mg/dL (ref 8.4–10.4)
Chloride: 109 mEq/L (ref 98–109)
Creatinine: 1 mg/dL (ref 0.6–1.1)
EGFR: 56 mL/min/{1.73_m2} — ABNORMAL LOW (ref >90)
GLUCOSE: 94 mg/dL (ref 70–140)
Potassium: 4.1 mEq/L (ref 3.5–5.1)
SODIUM: 144 meq/L (ref 136–145)
TOTAL PROTEIN: 6.5 g/dL (ref 6.4–8.3)

## 2014-08-10 LAB — CBC WITH DIFFERENTIAL/PLATELET
BASO%: 0.7 % (ref 0.0–2.0)
Basophils Absolute: 0.1 10*3/uL (ref 0.0–0.1)
EOS%: 3 % (ref 0.0–7.0)
Eosinophils Absolute: 0.5 10*3/uL (ref 0.0–0.5)
HCT: 35.4 % (ref 34.8–46.6)
HGB: 11.1 g/dL — ABNORMAL LOW (ref 11.6–15.9)
LYMPH#: 9.3 10*3/uL — AB (ref 0.9–3.3)
LYMPH%: 61.6 % — AB (ref 14.0–49.7)
MCH: 27.3 pg (ref 25.1–34.0)
MCHC: 31.4 g/dL — ABNORMAL LOW (ref 31.5–36.0)
MCV: 87 fL (ref 79.5–101.0)
MONO#: 0.9 10*3/uL (ref 0.1–0.9)
MONO%: 5.9 % (ref 0.0–14.0)
NEUT#: 4.3 10*3/uL (ref 1.5–6.5)
NEUT%: 28.8 % — ABNORMAL LOW (ref 38.4–76.8)
PLATELETS: 229 10*3/uL (ref 145–400)
RBC: 4.07 10*6/uL (ref 3.70–5.45)
RDW: 13.9 % (ref 11.2–14.5)
WBC: 15.1 10*3/uL — ABNORMAL HIGH (ref 3.9–10.3)

## 2014-08-10 LAB — TECHNOLOGIST REVIEW

## 2014-08-10 MED ORDER — SODIUM CHLORIDE 0.9 % IJ SOLN
10.0000 mL | INTRAMUSCULAR | Status: DC | PRN
  Administered 2014-08-10: 10 mL
  Filled 2014-08-10: qty 10

## 2014-08-10 MED ORDER — ACETAMINOPHEN 325 MG PO TABS
ORAL_TABLET | ORAL | Status: AC
  Filled 2014-08-10: qty 2

## 2014-08-10 MED ORDER — HEPARIN SOD (PORK) LOCK FLUSH 100 UNIT/ML IV SOLN
500.0000 [IU] | Freq: Once | INTRAVENOUS | Status: AC | PRN
  Administered 2014-08-10: 500 [IU]
  Filled 2014-08-10: qty 5

## 2014-08-10 MED ORDER — TRASTUZUMAB CHEMO INJECTION 440 MG
6.0000 mg/kg | Freq: Once | INTRAVENOUS | Status: AC
  Administered 2014-08-10: 483 mg via INTRAVENOUS
  Filled 2014-08-10: qty 23

## 2014-08-10 MED ORDER — SODIUM CHLORIDE 0.9 % IV SOLN
Freq: Once | INTRAVENOUS | Status: AC
  Administered 2014-08-10: 14:00:00 via INTRAVENOUS

## 2014-08-10 MED ORDER — ACETAMINOPHEN 325 MG PO TABS
650.0000 mg | ORAL_TABLET | Freq: Once | ORAL | Status: AC
  Administered 2014-08-10: 650 mg via ORAL

## 2014-08-10 NOTE — Patient Instructions (Signed)
[facility] Discharge Instructions for Patients Receiving Chemotherapy  Today you received the following chemotherapy agents: Herceptin  To help prevent nausea and vomiting after your treatment, we encourage you to take your nausea medication as prescribed by your physician.    If you develop nausea and vomiting that is not controlled by your nausea medication, call the clinic.   BELOW ARE SYMPTOMS THAT SHOULD BE REPORTED IMMEDIATELY:  *FEVER GREATER THAN 100.5 F  *CHILLS WITH OR WITHOUT FEVER  NAUSEA AND VOMITING THAT IS NOT CONTROLLED WITH YOUR NAUSEA MEDICATION  *UNUSUAL SHORTNESS OF BREATH  *UNUSUAL BRUISING OR BLEEDING  TENDERNESS IN MOUTH AND THROAT WITH OR WITHOUT PRESENCE OF ULCERS  *URINARY PROBLEMS  *BOWEL PROBLEMS  UNUSUAL RASH Items with * indicate a potential emergency and should be followed up as soon as possible.  Feel free to call the clinic you have any questions or concerns. The clinic phone number is [phone].    

## 2014-08-15 ENCOUNTER — Other Ambulatory Visit: Payer: Self-pay | Admitting: Oncology

## 2014-08-16 ENCOUNTER — Telehealth: Payer: Self-pay

## 2014-08-16 NOTE — Telephone Encounter (Signed)
Pt called and LMOM to see if Dr Everlene Farrier could write a Rx for oxycodone for her back pain. She reported that he did not Rx it but was hoping he would give her a Rx. She is busy w/holidays and can not come in right now for OV. Called pt back and advised that he can not write for this med w/out seeing her first for problem. I suggested that she call the provider who last Rxd it and see if they can RF it. She wasn't sure who that was but was assuming from the [facility], and agreed to try that, and will CB and set up appt to come see Dr Everlene Farrier soon since she is due for f/up.

## 2014-08-19 ENCOUNTER — Other Ambulatory Visit: Payer: Self-pay | Admitting: Oncology

## 2014-08-31 ENCOUNTER — Other Ambulatory Visit (HOSPITAL_BASED_OUTPATIENT_CLINIC_OR_DEPARTMENT_OTHER): Payer: Medicare Other

## 2014-08-31 ENCOUNTER — Ambulatory Visit (HOSPITAL_BASED_OUTPATIENT_CLINIC_OR_DEPARTMENT_OTHER): Payer: Medicare Other

## 2014-08-31 DIAGNOSIS — Z5112 Encounter for antineoplastic immunotherapy: Secondary | ICD-10-CM

## 2014-08-31 DIAGNOSIS — C50511 Malignant neoplasm of lower-outer quadrant of right female breast: Secondary | ICD-10-CM

## 2014-08-31 DIAGNOSIS — C50911 Malignant neoplasm of unspecified site of right female breast: Secondary | ICD-10-CM

## 2014-08-31 DIAGNOSIS — C50919 Malignant neoplasm of unspecified site of unspecified female breast: Secondary | ICD-10-CM

## 2014-08-31 LAB — CBC WITH DIFFERENTIAL/PLATELET
BASO%: 0.3 % (ref 0.0–2.0)
Basophils Absolute: 0.1 10*3/uL (ref 0.0–0.1)
EOS ABS: 0.4 10*3/uL (ref 0.0–0.5)
EOS%: 2.7 % (ref 0.0–7.0)
HCT: 35.2 % (ref 34.8–46.6)
HEMOGLOBIN: 11.2 g/dL — AB (ref 11.6–15.9)
LYMPH%: 63 % — AB (ref 14.0–49.7)
MCH: 27.5 pg (ref 25.1–34.0)
MCHC: 31.8 g/dL (ref 31.5–36.0)
MCV: 86.5 fL (ref 79.5–101.0)
MONO#: 0.7 10*3/uL (ref 0.1–0.9)
MONO%: 5 % (ref 0.0–14.0)
NEUT#: 4.2 10*3/uL (ref 1.5–6.5)
NEUT%: 29 % — ABNORMAL LOW (ref 38.4–76.8)
PLATELETS: 223 10*3/uL (ref 145–400)
RBC: 4.07 10*6/uL (ref 3.70–5.45)
RDW: 13.6 % (ref 11.2–14.5)
WBC: 14.5 10*3/uL — ABNORMAL HIGH (ref 3.9–10.3)
lymph#: 9.1 10*3/uL — ABNORMAL HIGH (ref 0.9–3.3)

## 2014-08-31 LAB — COMPREHENSIVE METABOLIC PANEL (CC13)
ALK PHOS: 98 U/L (ref 40–150)
ALT: 36 U/L (ref 0–55)
AST: 36 U/L — AB (ref 5–34)
Albumin: 4.1 g/dL (ref 3.5–5.0)
Anion Gap: 10 mEq/L (ref 3–11)
BUN: 13.9 mg/dL (ref 7.0–26.0)
CALCIUM: 9.9 mg/dL (ref 8.4–10.4)
CO2: 27 mEq/L (ref 22–29)
CREATININE: 1 mg/dL (ref 0.6–1.1)
Chloride: 104 mEq/L (ref 98–109)
EGFR: 58 mL/min/{1.73_m2} — ABNORMAL LOW (ref >90)
Glucose: 120 mg/dl (ref 70–140)
Potassium: 4 mEq/L (ref 3.5–5.1)
Sodium: 141 mEq/L (ref 136–145)
Total Bilirubin: 0.33 mg/dL (ref 0.20–1.20)
Total Protein: 6.7 g/dL (ref 6.4–8.3)

## 2014-08-31 LAB — TECHNOLOGIST REVIEW

## 2014-08-31 MED ORDER — ACETAMINOPHEN 325 MG PO TABS
650.0000 mg | ORAL_TABLET | Freq: Once | ORAL | Status: AC
  Administered 2014-08-31: 650 mg via ORAL

## 2014-08-31 MED ORDER — HEPARIN SOD (PORK) LOCK FLUSH 100 UNIT/ML IV SOLN
500.0000 [IU] | Freq: Once | INTRAVENOUS | Status: AC | PRN
  Administered 2014-08-31: 500 [IU]
  Filled 2014-08-31: qty 5

## 2014-08-31 MED ORDER — TRASTUZUMAB CHEMO INJECTION 440 MG
6.0000 mg/kg | Freq: Once | INTRAVENOUS | Status: AC
  Administered 2014-08-31: 483 mg via INTRAVENOUS
  Filled 2014-08-31: qty 23

## 2014-08-31 MED ORDER — ACETAMINOPHEN 325 MG PO TABS
ORAL_TABLET | ORAL | Status: AC
  Filled 2014-08-31: qty 2

## 2014-08-31 MED ORDER — SODIUM CHLORIDE 0.9 % IJ SOLN
10.0000 mL | INTRAMUSCULAR | Status: DC | PRN
  Administered 2014-08-31: 10 mL
  Filled 2014-08-31: qty 10

## 2014-08-31 MED ORDER — SODIUM CHLORIDE 0.9 % IV SOLN
Freq: Once | INTRAVENOUS | Status: AC
  Administered 2014-08-31: 14:00:00 via INTRAVENOUS

## 2014-08-31 NOTE — Patient Instructions (Signed)
[facility] Discharge Instructions for Patients Receiving Chemotherapy  Today you received the following chemotherapy agents herceptin   To help prevent nausea and vomiting after your treatment, we encourage you to take your nausea medication as directed   If you develop nausea and vomiting that is not controlled by your nausea medication, call the clinic.   BELOW ARE SYMPTOMS THAT SHOULD BE REPORTED IMMEDIATELY:  *FEVER GREATER THAN 100.5 F  *CHILLS WITH OR WITHOUT FEVER  NAUSEA AND VOMITING THAT IS NOT CONTROLLED WITH YOUR NAUSEA MEDICATION  *UNUSUAL SHORTNESS OF BREATH  *UNUSUAL BRUISING OR BLEEDING  TENDERNESS IN MOUTH AND THROAT WITH OR WITHOUT PRESENCE OF ULCERS  *URINARY PROBLEMS  *BOWEL PROBLEMS  UNUSUAL RASH Items with * indicate a potential emergency and should be followed up as soon as possible.  Feel free to call the clinic you have any questions or concerns. The clinic phone number is [phone].  

## 2014-09-01 ENCOUNTER — Other Ambulatory Visit: Payer: Self-pay | Admitting: Oncology

## 2014-09-03 ENCOUNTER — Other Ambulatory Visit: Payer: Self-pay | Admitting: Oncology

## 2014-09-03 DIAGNOSIS — C9110 Chronic lymphocytic leukemia of B-cell type not having achieved remission: Secondary | ICD-10-CM

## 2014-09-05 ENCOUNTER — Ambulatory Visit (HOSPITAL_COMMUNITY): Payer: Medicare Other

## 2014-09-07 ENCOUNTER — Ambulatory Visit (HOSPITAL_COMMUNITY)
Admission: RE | Admit: 2014-09-07 | Discharge: 2014-09-07 | Disposition: A | Discharge status: Home / Self Care | Payer: Medicare Other | Source: Ambulatory Visit | Attending: Oncology | Admitting: Oncology

## 2014-09-07 DIAGNOSIS — C50911 Malignant neoplasm of unspecified site of right female breast: Secondary | ICD-10-CM | POA: Insufficient documentation

## 2014-09-07 DIAGNOSIS — Z5111 Encounter for antineoplastic chemotherapy: Secondary | ICD-10-CM

## 2014-09-07 NOTE — Progress Notes (Signed)
  Echocardiogram 2D Echocardiogram has been performed.  [name] [name] M 09/07/2014, 12:00 PM

## 2014-09-19 ENCOUNTER — Encounter: Payer: Self-pay | Admitting: Cardiology

## 2014-09-19 DIAGNOSIS — R9430 Abnormal result of cardiovascular function study, unspecified: Secondary | ICD-10-CM | POA: Insufficient documentation

## 2014-09-20 ENCOUNTER — Encounter: Payer: Self-pay | Admitting: Cardiology

## 2014-09-20 ENCOUNTER — Ambulatory Visit (INDEPENDENT_AMBULATORY_CARE_PROVIDER_SITE_OTHER): Payer: Medicare Other | Admitting: Cardiology

## 2014-09-20 VITALS — BP 128/84 | HR 95 | Ht 65.0 in | Wt 198.0 lb

## 2014-09-20 DIAGNOSIS — R0602 Shortness of breath: Secondary | ICD-10-CM

## 2014-09-20 DIAGNOSIS — I471 Supraventricular tachycardia: Secondary | ICD-10-CM

## 2014-09-20 DIAGNOSIS — I951 Orthostatic hypotension: Secondary | ICD-10-CM

## 2014-09-20 DIAGNOSIS — R000 Tachycardia, unspecified: Secondary | ICD-10-CM

## 2014-09-20 DIAGNOSIS — C50511 Malignant neoplasm of lower-outer quadrant of right female breast: Secondary | ICD-10-CM

## 2014-09-20 NOTE — Assessment & Plan Note (Signed)
She is not having any recurrent orthostatic symptoms.

## 2014-09-20 NOTE — Progress Notes (Signed)
Cardiology Office Note   Date:  09/20/2014   ID:  [name] [name], DOB [birth year], MRN [id number]  PCP:  [name], MD  Cardiologist:   Dola Argyle, MD   Chief Complaint  Patient presents with  . Appointment    Follow-up shortness of breath      History of Present Illness: [name] [name] is a 69 y.o. female who presents to follow up a prior history of some shortness of breath and orthostatic hypotension. She's done well from the cardiac viewpoint. She does have breast cancer. She has been receiving chemotherapy. She is had several echoes and her left ventricular function has remained stable. She is not having any significant chest pain or shortness of breath. She has not had any syncope presyncope or dizziness.    Past Medical History  Diagnosis Date  . Sinus tachycardia     mild resting  . HLD (hyperlipidemia)   . Orthostatic hypotension     slight in the past  . Anxiety   . Depression   . Diverticulosis   . Fibromyalgia 1978  . Hypothyroidism   . Alopecia   . Ruptured disk     one in neck and two in back  . Plantar fasciitis   . GERD (gastroesophageal reflux disease)   . History of syncope     episode 2008--  no recurrence since  . Ureteral calculi     bilateral  . Leukocytosis   . Chronic back pain     "lower back and upper neck" (07/28/2013)  . 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 ear   . Urgency of urination   . Hematuria   . Sensation of pressure in bladder area   . Wears glasses   . Ecchymosis     FROM IV AND LAB WORK THE PAST WEEK  06-17-2013  . Stool incontinence     "at times recently"   . Kidney stones 2014  . CAP (community acquired pneumonia)     admission 06-04-2013 secondary to failed oral antibitotics---  last cxr 06-16-2013  much improved  . SOB (shortness of breath) 06-17-2013 CURRENTLY RESIDUAL FROM RECENT CAP    SOB from obesity and deconditioning 2009- eval included CPX  . Arthritis     "spine" (07/28/2013)  . DDD (degenerative disc disease)   .  CLL (chronic lymphocytic leukemia) dx'd 05/2013    "I don't think I have it though" (07/28/2013)  . Breast cancer 1997; 2014    right  . Ejection fraction     Past Surgical History  Procedure Laterality Date  . Lumbar epidural injection      has had 7 injections  . Total abdominal hysterectomy w/ bilateral salpingoophorectomy  1997  . Breast lumpectomy with axillary lymph node dissection Right 1997  . Transthoracic echocardiogram  05-11-2007  dr Ron Parker    normal lvf/  ef 65%  . Tonsillectomy  AGE 46  . Retinal detachment surgery Right 2013  . Cystoscopy with retrograde pyelogram, ureteroscopy and stent placement Bilateral 06/17/2013    Procedure: CYSTOSCOPY WITH RETROGRADE PYELOGRAM, URETEROSCOPY AND LEFT DOUBLE  J STENT PLACEMENT RIGHT URETERAL HOLMIIUM LASER AND DOUBLE J STENT ;  Surgeon: Hanley Ben, MD;  Location: [facility];  Service: Urology;  Laterality: Bilateral;  . Holmium laser application Bilateral 60/73/7106    Procedure: HOLMIUM LASER APPLICATION;  Surgeon: Hanley Ben, MD;  Location: [facility];  Service: Urology;  Laterality: Bilateral;  . Mastectomy w/ nodes partial Right 1997  .  Mastectomy complete / simple Right 07/28/2013  . Appendectomy  1997  . Breast biopsy Right 2014  . Cystoscopy with ureteroscopy, stone basketry and stent placement Bilateral 2014  . Lithotripsy Left ~ 06/2013  . Simple mastectomy with axillary sentinel node biopsy Right 07/28/2013    Procedure: RIGHT TOTAL  MASTECTOMY;  Surgeon: Imogene Burn. Georgette Dover, MD;  Location: [facility];  Service: General;  Laterality: Right;  . Portacath placement Left 07/28/2013    Procedure: ATTEMPTED INSERTION PORT-A-CATH;  Surgeon: Imogene Burn. Georgette Dover, MD;  Location: [facility];  Service: General;  Laterality: Left;     Current Outpatient Prescriptions  Medication Sig Dispense Refill  . ALPRAZolam (XANAX) 0.5 MG tablet TAKE 1 TABLET EVERY 8 HOURS AS NEEDED (Patient taking differently: TAKE 1  TABLET BY MOUTH EVERY 8 HOURS AS NEEDED FOR ANXIETY) 30 tablet 0  . amitriptyline (ELAVIL) 150 MG tablet Take 150 mg by mouth at bedtime.    Marland Kitchen anastrozole (ARIMIDEX) 1 MG tablet TAKE 1 TABLET (1 MG TOTAL) BY MOUTH DAILY. 90 tablet 0  . Ascorbic Acid (VITAMIN C) 1000 MG tablet Take 1,000 mg by mouth daily.    Marland Kitchen buPROPion (WELLBUTRIN SR) 150 MG 12 hr tablet Take 150-300 mg by mouth 2 (two) times daily. Take 300mg  in the morning and 150mg  at 2pm    . Cholecalciferol (VITAMIN D-3) 1000 UNITS CAPS Take 5,000 Units by mouth daily.    . diphenhydrAMINE (BENADRYL) 25 MG tablet Take 25 mg by mouth every 6 (six) hours as needed for allergies.    Marland Kitchen ibuprofen (ADVIL,MOTRIN) 200 MG tablet Take 800 mg by mouth every 6 (six) hours as needed for moderate pain.    Marland Kitchen levothyroxine (SYNTHROID, LEVOTHROID) 50 MCG tablet Take 1 tablet (50 mcg total) by mouth daily before breakfast. 90 tablet 3  . lidocaine-prilocaine (EMLA) cream APPLY 1 APPLICATION TOPICALLY AS NEEDED. (Patient taking differently: APPLY 1 APPLICATION TOPICALLY DAILY AS NEEDED FOR PAIN) 30 g 3  . metoprolol succinate (TOPROL-XL) 25 MG 24 hr tablet TAKE 1 TABLET (25 MG TOTAL) BY MOUTH DAILY. 30 tablet 5  . minoxidil (ROGAINE) 2 % external solution Apply 1 application topically 2 (two) times daily as needed.     . Multiple Vitamins-Minerals (HAIR VITAMINS PO) Take 1 tablet by mouth 2 (two) times daily.    . NON FORMULARY Take 1 tablet by mouth daily. (Rezults)    . Omega-3 Fatty Acids (FISH OIL PO) Take 1 capsule by mouth daily.     Marland Kitchen omeprazole (PRILOSEC) 40 MG capsule TAKE 1 CAPSULE (40 MG TOTAL) BY MOUTH EVERY MORNING. 90 capsule 0  . Oyster Shell (OYSTER CALCIUM) 500 MG TABS tablet Take 500 mg of elemental calcium by mouth 2 (two) times daily.    . Vilazodone HCl (VIIBRYD) 20 MG TABS Take 1 tablet by mouth 2 (two) times daily.     Marland Kitchen oxyCODONE-acetaminophen (PERCOCET/ROXICET) 5-325 MG per tablet Take 1 tablet by mouth every 4 (four) hours as needed  for moderate pain. (Patient not taking: Reported on 09/20/2014) 40 tablet 0   No current facility-administered medications for this visit.    Allergies:   Amoxicillin; Lasix; Lithium; Sulfa antibiotics; Trazodone and nefazodone; and Lyrica    Social History:  The patient  reports that she has never smoked. She has never used smokeless tobacco. She reports that she does not drink alcohol or use illicit drugs.   Family History:  The patient's family history includes Bipolar disorder in her maternal aunt; Brain cancer in  her paternal grandmother; Colon cancer (age of onset: 79) in her maternal aunt; Dementia in her mother; Diabetes in her maternal aunt, maternal grandfather, and mother; Heart disease in her maternal grandfather; Osteoporosis in her mother; Prostate cancer (age of onset: 68) in her father; Stomach cancer in her paternal uncle; Stroke in her maternal aunt.    ROS:  Please see the history of present illness.   Patient denies fever, chills, headache, sweats, rash, change in vision, change in hearing, chest pain, cough, nausea or vomiting, urinary symptoms.   All other systems are reviewed and negative.    PHYSICAL EXAM: VS:  BP 128/84 mmHg  Pulse 95  Ht 5\' 5"  (1.651 m)  Wt 198 lb (89.812 kg)  BMI 32.95 kg/m2  LMP 08/26/1995 , BMI Body mass index is 32.95 kg/(m^2). GEN: Well nourished, well developed, in no acute distress She is overweight. She is oriented to person time and place. Affect is normal. Head is atraumatic. Sclera and conjunctiva are normal. There is no jugulovenous distention. Lungs are clear. Respiratory effort is nonlabored. Cardiac exam reveals S1 and S2. There are no clicks or significant murmurs. The abdomen is soft. There is no peripheral edema. There are no musculoskeletal deformities. There are no skin rashes.   EKG:  The ekg ordered today demonstrates no change from the past. There is sinus rhythm. There are mild nonspecific ST-T wave changes.   Recent  Labs: 04/27/2014: Magnesium 2.2 08/31/2014: ALT 36; BUN 13.9; Creatinine 1.0; Hemoglobin 11.2*; Platelets 223; Potassium 4.0; Sodium 141    Lipid Panel No results found for: CHOL, TRIG, HDL, CHOLHDL, VLDL, LDLCALC, LDLDIRECT    Wt Readings from Last 3 Encounters:  09/20/14 198 lb (89.812 kg)  08/31/14 184 lb (83.462 kg)  06/15/14 176 lb 6.4 oz (80.015 kg)      Other studies Reviewed:    ASSESSMENT AND PLAN:  Problems and assessment are listed at the bottom of the note. Current medicines are reviewed at length with the patient today.  The patient does not have concerns regarding medicines.  The following changes have been made:  no change  Disposition:   FU in 2 years with the cardiology team.   Signed, Dola Argyle, MD  09/20/2014 11:48 AM    [address], [place], [place]  [address] Phone: [phone]; Fax: [phone]

## 2014-09-20 NOTE — Assessment & Plan Note (Signed)
She's not having any recurrent shortness of breath. Cardiopulmonary exercise test in the past showed that her problem was related to being overweight and deconditioning.

## 2014-09-20 NOTE — Patient Instructions (Signed)
Your physician recommends that you continue on your current medications as directed. Please refer to the Current Medication list given to you today.  Your physician wants you to follow-up in: 2 years  You will receive a reminder letter in the mail two months in advance. If you don't receive a letter, please call our office to schedule the follow-up appointment.  

## 2014-09-20 NOTE — Assessment & Plan Note (Signed)
Patient is receiving ongoing chemotherapy for breast cancer. Her left trigger function has remained normal by echo.

## 2014-09-21 ENCOUNTER — Ambulatory Visit (HOSPITAL_BASED_OUTPATIENT_CLINIC_OR_DEPARTMENT_OTHER): Payer: Medicare Other

## 2014-09-21 ENCOUNTER — Telehealth: Payer: Self-pay | Admitting: Nurse Practitioner

## 2014-09-21 ENCOUNTER — Other Ambulatory Visit (HOSPITAL_BASED_OUTPATIENT_CLINIC_OR_DEPARTMENT_OTHER): Payer: Medicare Other

## 2014-09-21 ENCOUNTER — Ambulatory Visit (HOSPITAL_BASED_OUTPATIENT_CLINIC_OR_DEPARTMENT_OTHER): Payer: Medicare Other | Admitting: Nurse Practitioner

## 2014-09-21 ENCOUNTER — Encounter: Payer: Self-pay | Admitting: Nurse Practitioner

## 2014-09-21 VITALS — BP 121/49 | HR 86 | Temp 98.6°F | Resp 19 | Ht 65.0 in | Wt 198.0 lb

## 2014-09-21 DIAGNOSIS — Z5112 Encounter for antineoplastic immunotherapy: Secondary | ICD-10-CM

## 2014-09-21 DIAGNOSIS — R197 Diarrhea, unspecified: Secondary | ICD-10-CM

## 2014-09-21 DIAGNOSIS — Z170 Estrogen receptor positive status [ER+]: Secondary | ICD-10-CM

## 2014-09-21 DIAGNOSIS — C50511 Malignant neoplasm of lower-outer quadrant of right female breast: Secondary | ICD-10-CM

## 2014-09-21 DIAGNOSIS — C9110 Chronic lymphocytic leukemia of B-cell type not having achieved remission: Secondary | ICD-10-CM

## 2014-09-21 DIAGNOSIS — C50919 Malignant neoplasm of unspecified site of unspecified female breast: Secondary | ICD-10-CM

## 2014-09-21 DIAGNOSIS — C50911 Malignant neoplasm of unspecified site of right female breast: Secondary | ICD-10-CM

## 2014-09-21 LAB — CBC WITH DIFFERENTIAL/PLATELET
BASO%: 1.3 % (ref 0.0–2.0)
Basophils Absolute: 0.2 10*3/uL — ABNORMAL HIGH (ref 0.0–0.1)
EOS%: 3.3 % (ref 0.0–7.0)
Eosinophils Absolute: 0.4 10*3/uL (ref 0.0–0.5)
HCT: 30.9 % — ABNORMAL LOW (ref 34.8–46.6)
HEMOGLOBIN: 9.6 g/dL — AB (ref 11.6–15.9)
LYMPH%: 63.2 % — ABNORMAL HIGH (ref 14.0–49.7)
MCH: 26.3 pg (ref 25.1–34.0)
MCHC: 31.1 g/dL — AB (ref 31.5–36.0)
MCV: 84.7 fL (ref 79.5–101.0)
MONO#: 0.9 10*3/uL (ref 0.1–0.9)
MONO%: 6.8 % (ref 0.0–14.0)
NEUT#: 3.3 10*3/uL (ref 1.5–6.5)
NEUT%: 25.4 % — ABNORMAL LOW (ref 38.4–76.8)
PLATELETS: 160 10*3/uL (ref 145–400)
RBC: 3.65 10*6/uL — AB (ref 3.70–5.45)
RDW: 14.3 % (ref 11.2–14.5)
WBC: 13.1 10*3/uL — AB (ref 3.9–10.3)
lymph#: 8.3 10*3/uL — ABNORMAL HIGH (ref 0.9–3.3)

## 2014-09-21 LAB — COMPREHENSIVE METABOLIC PANEL (CC13)
ALK PHOS: 89 U/L (ref 40–150)
ALT: 28 U/L (ref 0–55)
AST: 23 U/L (ref 5–34)
Albumin: 3.6 g/dL (ref 3.5–5.0)
Anion Gap: 10 mEq/L (ref 3–11)
BILIRUBIN TOTAL: 0.29 mg/dL (ref 0.20–1.20)
BUN: 14 mg/dL (ref 7.0–26.0)
CHLORIDE: 112 meq/L — AB (ref 98–109)
CO2: 24 meq/L (ref 22–29)
CREATININE: 1.1 mg/dL (ref 0.6–1.1)
Calcium: 8.9 mg/dL (ref 8.4–10.4)
EGFR: 54 mL/min/{1.73_m2} — AB (ref >90)
GLUCOSE: 106 mg/dL (ref 70–140)
Potassium: 4.5 mEq/L (ref 3.5–5.1)
SODIUM: 146 meq/L — AB (ref 136–145)
Total Protein: 5.9 g/dL — ABNORMAL LOW (ref 6.4–8.3)

## 2014-09-21 LAB — TECHNOLOGIST REVIEW

## 2014-09-21 LAB — CHCC SMEAR

## 2014-09-21 MED ORDER — HEPARIN SOD (PORK) LOCK FLUSH 100 UNIT/ML IV SOLN
500.0000 [IU] | Freq: Once | INTRAVENOUS | Status: AC | PRN
  Administered 2014-09-21: 500 [IU]
  Filled 2014-09-21: qty 5

## 2014-09-21 MED ORDER — ACETAMINOPHEN 325 MG PO TABS
ORAL_TABLET | ORAL | Status: AC
  Filled 2014-09-21: qty 2

## 2014-09-21 MED ORDER — ACETAMINOPHEN 325 MG PO TABS
650.0000 mg | ORAL_TABLET | Freq: Once | ORAL | Status: AC
  Administered 2014-09-21: 650 mg via ORAL

## 2014-09-21 MED ORDER — SODIUM CHLORIDE 0.9 % IV SOLN
Freq: Once | INTRAVENOUS | Status: AC
  Administered 2014-09-21: 13:00:00 via INTRAVENOUS

## 2014-09-21 MED ORDER — SODIUM CHLORIDE 0.9 % IJ SOLN
10.0000 mL | INTRAMUSCULAR | Status: DC | PRN
  Administered 2014-09-21: 10 mL
  Filled 2014-09-21: qty 10

## 2014-09-21 MED ORDER — SODIUM CHLORIDE 0.9 % IV SOLN
6.0000 mg/kg | Freq: Once | INTRAVENOUS | Status: AC
  Administered 2014-09-21: 483 mg via INTRAVENOUS
  Filled 2014-09-21: qty 23

## 2014-09-21 NOTE — Telephone Encounter (Signed)
perpof to sch ptappt-sent email per protocol to [facility] for port removal-adv pt that office to call her to sch appt-gave pt copy of sch

## 2014-09-21 NOTE — Progress Notes (Signed)
ID: [name] [name] OB: [birth year]  MR#: [id number]  UUV#:[id number]  PCP: [name], MD GYN:  M. 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 Annabell Sabal performed a right lumpectomy and axillary lymph node dissection for what according to the patient and her family was a stage I invasive ductal breast cancer. She received radiation adjuvantly and then took tamoxifen for 5 years  In February of 2014 she had a normal screening mammography, but in November 2014 she felt a "hard lump" in her right breast. She brought this to the attention of her urologist, Dr. Izora Gala, and he set her up for bilateral diagnostic mammography with mammography at [facility] 07/13/2013. This showed her breast density to be category B. There was no mammographic abnormality noted but ultrasonography showed a 1.4 cm irregularly shaped solid mass in the right breast at the 8:00 position. This was biopsied the same day, and showed ([id number]) an invasive ductal carcinoma, grade 2, estrogen receptor 100% positive, progesterone receptor 13% positive, with an MIB-1 of 33% and HER-2 amplification with a HER-2: CEP 17 ratio of 3.19, and a HER-2 copy number percent all of 4.15.  The patient's subsequent history is as detailed below  INTERVAL HISTORY: [name] [name] returns today for followup of her recurrent right breast cancer, accompanied by her husband [name] [name]. She is due for her last dose of trastuzumab today. She is also on anastrozole daily and is tolerating it fairly well. Her hot flashes have actually improved some and her vaginal dryness is manageable. She has a history of fibromyalgia, but these aches and pains are no  worse while on this drug. Her main complaint today is diarrhea. She has a history of loose stools, even before the trastuzumab, but for the past 3 weeks she has had 4-5 bowel movements daily. She takes imodium inconsistently. She has questran powder at home but doesn't like it and stopped using it.   REVIEW OF SYSTEMS: [name] [name] denies fevers, chills, nausea, or vomiting. Sometimes she has some abdominal pain and hemorrhoidal pain after the diarrhea. She is eating and drinking well. She denies shortness of breath, chest pain, cough, or palpitations. She is forgetful at times. She endorses anxiety but not depression. A detailed review of systems is otherwise stable.  PAST MEDICAL HISTORY: Past Medical History  Diagnosis Date  . Sinus tachycardia     mild resting  . HLD (hyperlipidemia)   . Orthostatic hypotension     slight in the past  . Anxiety   . Depression   . Diverticulosis   . Fibromyalgia 1978  . Hypothyroidism   . Alopecia   . Ruptured disk     one in neck and two in back  . Plantar fasciitis   . GERD (gastroesophageal reflux disease)   . History of syncope     episode 2008--  no recurrence since  . Ureteral calculi     bilateral  . Leukocytosis   . Chronic back pain     "lower back and upper neck" (07/28/2013)  . 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 ear   . Urgency of urination   . Hematuria   . Sensation of pressure in bladder area   . Wears glasses   .  Ecchymosis     FROM IV AND LAB WORK THE PAST WEEK  06-17-2013  . Stool incontinence     "at times recently"   . Kidney stones 2014  . CAP (community acquired pneumonia)     admission 06-04-2013 secondary to failed oral antibitotics---  last cxr 06-16-2013  much improved  . SOB (shortness of breath) 06-17-2013 CURRENTLY RESIDUAL FROM RECENT CAP    SOB from obesity and deconditioning 2009- eval included CPX  . Arthritis     "spine" (07/28/2013)  . DDD (degenerative disc disease)   . CLL (chronic lymphocytic leukemia) dx'd 05/2013    "I  don't think I have it though" (07/28/2013)  . Breast cancer 1997; 2014    right  . Ejection fraction     PAST SURGICAL HISTORY: Past Surgical History  Procedure Laterality Date  . Lumbar epidural injection      has had 7 injections  . Total abdominal hysterectomy w/ bilateral salpingoophorectomy  1997  . Breast lumpectomy with axillary lymph node dissection Right 1997  . Transthoracic echocardiogram  05-11-2007  dr Ron Parker    normal lvf/  ef 65%  . Tonsillectomy  AGE 65  . Retinal detachment surgery Right 2013  . Cystoscopy with retrograde pyelogram, ureteroscopy and stent placement Bilateral 06/17/2013    Procedure: CYSTOSCOPY WITH RETROGRADE PYELOGRAM, URETEROSCOPY AND LEFT DOUBLE  J STENT PLACEMENT RIGHT URETERAL HOLMIIUM LASER AND DOUBLE J STENT ;  Surgeon: Hanley Ben, MD;  Location: [facility];  Service: Urology;  Laterality: Bilateral;  . Holmium laser application Bilateral 44/62/8638    Procedure: HOLMIUM LASER APPLICATION;  Surgeon: Hanley Ben, MD;  Location: [facility];  Service: Urology;  Laterality: Bilateral;  . Mastectomy w/ nodes partial Right 1997  . Mastectomy complete / simple Right 07/28/2013  . Appendectomy  1997  . Breast biopsy Right 2014  . Cystoscopy with ureteroscopy, stone basketry and stent placement Bilateral 2014  . Lithotripsy Left ~ 06/2013  . Simple mastectomy with axillary sentinel node biopsy Right 07/28/2013    Procedure: RIGHT TOTAL  MASTECTOMY;  Surgeon: Imogene Burn. Georgette Dover, MD;  Location: [facility];  Service: General;  Laterality: Right;  . Portacath placement Left 07/28/2013    Procedure: ATTEMPTED INSERTION PORT-A-CATH;  Surgeon: Imogene Burn. Georgette Dover, MD;  Location: [facility];  Service: General;  Laterality: Left;    FAMILY HISTORY Family History  Problem Relation Age of Onset  . Heart disease Maternal Grandfather   . Diabetes Maternal Grandfather   . Colon cancer Maternal Aunt 79  . Brain cancer Paternal Grandmother      dx in 27s  . Dementia Mother   . Diabetes Mother   . Osteoporosis Mother   . Diabetes Maternal Aunt   . Prostate cancer Father 42  . Bipolar disorder Maternal Aunt   . Stroke Maternal Aunt   . Stomach cancer Paternal Uncle     dx in late 67s   the patient's mother died at the age of 62. The patient's father is alive at age 32. She had no brothers or sisters. Her father has a history of prostate cancer. There is no history of breast or ovarian cancer in the family. One maternal first cousin has a history of Hodgkin's lymphoma.  GYNECOLOGIC HISTORY:  Menarche age 35, first live birth age 35. She is GX P1. She underwent total abdominal hysterectomy and bilateral salpingo-oophorectomy in 1997 she did not take hormone replacement.  SOCIAL HISTORY: (Updated  11/15/2013)  She is  a homemaker. Her husband [name] [name] farms approximately 500 acres. Daughter [name] [name] lives in [place] where she works as a Pension scheme manager for a drug company. The patient has no grandchildren. She has two "grand cats". She attends a local united church of [place]: Not in place   HEALTH MAINTENANCE:  (Updated   11/15/2013 ) History  Substance Use Topics  . Smoking status: Never Smoker   . Smokeless tobacco: Never Used  . Alcohol Use: No     Comment: 07/28/2013 "no alcohol since 1994; never had problem w/it"     Colonoscopy: 2009/ [facility]  PAP: Status post hysterectomy  Bone density: May 10/14/2009 at [facility] was normal  Lipid panel:  Not on file   Allergies  Allergen Reactions  . Amoxicillin Itching  . Lasix [Furosemide] Nausea And Vomiting and Other (See Comments)    headache  . Lithium Other (See Comments)    Dizziness, "cause me to fall"  . Sulfa Antibiotics Hives  . Trazodone And Nefazodone Other (See Comments)    insomnia  . Lyrica [Pregabalin] Diarrhea, Nausea Only and Rash    Current Outpatient Prescriptions  Medication Sig Dispense Refill  .  ALPRAZolam (XANAX) 0.5 MG tablet TAKE 1 TABLET EVERY 8 HOURS AS NEEDED (Patient taking differently: TAKE 1 TABLET BY MOUTH EVERY 12 HOURS .) 30 tablet 0  . amitriptyline (ELAVIL) 150 MG tablet Take 150 mg by mouth at bedtime.    Marland Kitchen anastrozole (ARIMIDEX) 1 MG tablet TAKE 1 TABLET (1 MG TOTAL) BY MOUTH DAILY. 90 tablet 0  . Ascorbic Acid (VITAMIN C) 1000 MG tablet Take 500 mg by mouth daily.     Marland Kitchen buPROPion (WELLBUTRIN SR) 150 MG 12 hr tablet Take 150-300 mg by mouth 2 (two) times daily. Take 353m in the morning and 1540mat 2pm    . Cholecalciferol (VITAMIN D-3) 1000 UNITS CAPS Take 5,000 Units by mouth daily.    . Marland Kitchenbuprofen (ADVIL,MOTRIN) 200 MG tablet Take 800 mg by mouth every 6 (six) hours as needed for moderate pain.    . Marland Kitchenevothyroxine (SYNTHROID, LEVOTHROID) 50 MCG tablet Take 1 tablet (50 mcg total) by mouth daily before breakfast. 90 tablet 3  . lidocaine-prilocaine (EMLA) cream APPLY 1 APPLICATION TOPICALLY AS NEEDED. (Patient taking differently: APPLY 1 APPLICATION TOPICALLY DAILY AS NEEDED FOR PAIN) 30 g 3  . metoprolol succinate (TOPROL-XL) 25 MG 24 hr tablet TAKE 1 TABLET (25 MG TOTAL) BY MOUTH DAILY. 30 tablet 5  . Multiple Vitamins-Minerals (HAIR VITAMINS PO) Take 1 tablet by mouth 2 (two) times daily.    . Omega-3 Fatty Acids (FISH OIL PO) Take 1 capsule by mouth daily.     . Marland Kitchenmeprazole (PRILOSEC) 40 MG capsule TAKE 1 CAPSULE (40 MG TOTAL) BY MOUTH EVERY MORNING. 90 capsule 0  . Oyster Shell (OYSTER CALCIUM) 500 MG TABS tablet Take 500 mg of elemental calcium by mouth 2 (two) times daily.    . Marland KitchenNABLE TO FIND Take 0.05 mg by mouth at bedtime. Rezulti Immune Complex w/ Zinc    . Vilazodone HCl (VIIBRYD) 20 MG TABS Take 1 tablet by mouth 2 (two) times daily.     . Marland KitchenIOTIN PO Take by mouth.    . diphenhydrAMINE (BENADRYL) 25 MG tablet Take 25 mg by mouth every 6 (six) hours as needed for allergies.    . minoxidil (ROGAINE) 2 % external solution Apply 1 application topically 2 (two) times  daily as needed.     . Marland KitchenxyCODONE-acetaminophen (  PERCOCET/ROXICET) 5-325 MG per tablet Take 1 tablet by mouth every 4 (four) hours as needed for moderate pain. (Patient not taking: Reported on 09/20/2014) 40 tablet 0   No current facility-administered medications for this visit.   Facility-Administered Medications Ordered in Other Visits  Medication Dose Route Frequency Provider Last Rate Last Dose  . sodium chloride 0.9 % injection 10 mL  10 mL Intracatheter PRN Chauncey Cruel, MD   10 mL at 09/21/14 1417    OBJECTIVE: 69 year old white woman who appears stated age 17 Vitals:   09/21/14 1147  BP: 121/49  Pulse: 86  Temp: 98.6 F (37 C)  Resp: 19     Body mass index is 32.95 kg/(m^2).    ECOG FS:1 - Symptomatic but completely ambulatory Filed Weights   09/21/14 1147  Weight: 198 lb (89.812 kg)   Skin: warm, dry  HEENT: sclerae anicteric, conjunctivae pink, oropharynx clear. No thrush or mucositis.  Lymph Nodes: No cervical or supraclavicular lymphadenopathy  Lungs: clear to auscultation bilaterally, no rales, wheezes, or rhonci  Heart: regular rate and rhythm  Abdomen: round, soft, non tender, positive bowel sounds  Musculoskeletal: No focal spinal tenderness, no peripheral edema  Neuro: non focal, well oriented, positive affect  Breasts: deferred  LAB RESULTS:   Lab Results  Component Value Date   WBC 13.1* 09/21/2014   NEUTROABS 3.3 09/21/2014   HGB 9.6* 09/21/2014   HCT 30.9* 09/21/2014   MCV 84.7 09/21/2014   PLT 160 09/21/2014      Chemistry      Component Value Date/Time   NA 146* 09/21/2014 1126   NA 134* 04/11/2014 1336   K 4.5 09/21/2014 1126   K 4.2 04/11/2014 1336   CL 100 04/11/2014 1336   CO2 24 09/21/2014 1126   CO2 25 04/11/2014 1336   BUN 14.0 09/21/2014 1126   BUN 14 04/11/2014 1336   CREATININE 1.1 09/21/2014 1126   CREATININE 0.95 04/11/2014 1336   CREATININE 0.76 07/30/2013 0422      Component Value Date/Time   CALCIUM 8.9  09/21/2014 1126   CALCIUM 8.9 04/11/2014 1336   ALKPHOS 89 09/21/2014 1126   ALKPHOS 95 04/11/2014 1336   AST 23 09/21/2014 1126   AST 30 04/11/2014 1336   ALT 28 09/21/2014 1126   ALT 37* 04/11/2014 1336   BILITOT 0.29 09/21/2014 1126   BILITOT 0.6 04/11/2014 1336      STUDIES: No results found.  Most recent echocardiogram on 09/07/14 showed an EF of 55-60%  ASSESSMENT: 69 y.o. BRCA negative [place] woman  (1) status post right lumpectomy and axillary lymph node dissection in 1997 for a stage I breast cancer, treated with adjuvant radiation and tamoxifen for 5 years  (2) status post right breast biopsy 07/13/2013 for a clinical T1c N0, stage IA invasive ductal carcinoma, grade 2, estrogen receptor 100% positive, progesterone receptor 13% positive, with an MIB-1 of 33%, and HER-2 amplification by CISH with a HER2/CEP 17 ratio of 3.19, and an average HER-2 copy number per cell of 4.15  (3) status post right mastectomy 07/28/2013 for a pT1c pN0, stage IA invasive ductal carcinoma, grade 3, with close but negative margins. Prognostic panel was not repeated  (4) completed weekly paclitaxel x12 12/06/2913, with trastuzumab/ pertuzumab every 3 weeks; pertuzumab was held with third dose on 11/01/2013 due to diarrhea, tried a half dose on cycle 4 again with diarrhea developing  (5) trastuzumab (started 09/20/2013) to be continued for 1 year; most recent echocardiogram  on 06/02/2014  shows a well preserved ejection fraction  (6) anastrozole started May 2015; bone density 12/15/2013 normal   OTHER PROBLEMS:  (a) History of chronic lymphoid leukemia diagnosed by flow cytometry 06/29/2013, the cells being CD5, CD20 and CD23 positive, CD10 negative.   (b) Anemia with a normal MCV and normal ferritin-- B-12 and folate normal  (c) the patient met with Dr. Harlow Mares and has decided against reconstruction   PLAN: Calee is doing well today. The labs were reviewed in detail and were  relatively stable. Her hgb is down to 9.6 today, but she is completely asymptomatic at this time. She will proceed with her final dose of trastuzumab today. Her last echocardiogram wa son 1/14 and showed a well preserved ejection fraction. She is tolerating the anastrozole well and the plan is to continue that for at least 5 years of antiestrogen therapy.   Tynasia is interested in having her port removed as soon as possible, so I have placed orders for a referral to Dr. Vonna Kotyk office to have this accomplished.  She is following up with Dr. Ardis Hughs in 2 weeks to address her recurrent diarrhea.   Deretha will return for labs and a follow up visit in 3 months. She understands and agrees with this plan. She knows the goal of treatment in her case is cure. She has been encouraged to call with any issues that might arise before her next visit here.  Marcelino Duster, NP  09/21/2014 3:08 PM

## 2014-09-21 NOTE — Patient Instructions (Signed)
[facility] Discharge Instructions for Patients Receiving Chemotherapy  Today you received the following chemotherapy agents: Herceptin  To help prevent nausea and vomiting after your treatment, we encourage you to take your nausea medication as prescribed by your physician.    If you develop nausea and vomiting that is not controlled by your nausea medication, call the clinic.   BELOW ARE SYMPTOMS THAT SHOULD BE REPORTED IMMEDIATELY:  *FEVER GREATER THAN 100.5 F  *CHILLS WITH OR WITHOUT FEVER  NAUSEA AND VOMITING THAT IS NOT CONTROLLED WITH YOUR NAUSEA MEDICATION  *UNUSUAL SHORTNESS OF BREATH  *UNUSUAL BRUISING OR BLEEDING  TENDERNESS IN MOUTH AND THROAT WITH OR WITHOUT PRESENCE OF ULCERS  *URINARY PROBLEMS  *BOWEL PROBLEMS  UNUSUAL RASH Items with * indicate a potential emergency and should be followed up as soon as possible.  Feel free to call the clinic you have any questions or concerns. The clinic phone number is [phone].    

## 2014-09-22 ENCOUNTER — Ambulatory Visit (INDEPENDENT_AMBULATORY_CARE_PROVIDER_SITE_OTHER): Payer: Self-pay | Admitting: Surgery

## 2014-09-27 ENCOUNTER — Telehealth: Payer: Self-pay | Admitting: Obstetrics & Gynecology

## 2014-09-27 NOTE — Telephone Encounter (Signed)
Left message to reschedule aex appointment. °

## 2014-09-28 ENCOUNTER — Encounter (HOSPITAL_BASED_OUTPATIENT_CLINIC_OR_DEPARTMENT_OTHER): Payer: Self-pay | Admitting: *Deleted

## 2014-09-28 NOTE — Progress Notes (Signed)
No labs needed

## 2014-10-02 ENCOUNTER — Ambulatory Visit (INDEPENDENT_AMBULATORY_CARE_PROVIDER_SITE_OTHER): Payer: Medicare Other | Admitting: Gastroenterology

## 2014-10-02 ENCOUNTER — Encounter: Payer: Self-pay | Admitting: Gastroenterology

## 2014-10-02 VITALS — BP 128/70 | HR 86 | Ht 65.0 in | Wt 194.4 lb

## 2014-10-02 DIAGNOSIS — R1314 Dysphagia, pharyngoesophageal phase: Secondary | ICD-10-CM

## 2014-10-02 DIAGNOSIS — K219 Gastro-esophageal reflux disease without esophagitis: Secondary | ICD-10-CM

## 2014-10-02 DIAGNOSIS — R197 Diarrhea, unspecified: Secondary | ICD-10-CM

## 2014-10-02 MED ORDER — PEG-KCL-NACL-NASULF-NA ASC-C 100 G PO SOLR: 1.0000 | Freq: Once | ORAL | Status: DC

## 2014-10-02 NOTE — Progress Notes (Signed)
HPI: This is a   very pleasant 69 year old woman who I last saw about 2-1/2 years ago.  Completed chemotherapy in the past month. Was very bothered by terrible diarrhea and anal discomfort.  Had fecal incontinence during that time.  Since she stopped chemo, she feels her bowels have improved but not normal. Still has 4-5 BMs in a day.  Usually pretty loose stools.  Never sees blood in her stool.  Has been taking imodium, one pill every morning.  This helps.  Prior to chemo she was bothered by some urgency.  She has spinal, disc problems.    She was a previous long term patient of Dr. Manning Charity he had perform colonoscopy for her in 2007 the found no polyps. The exam was normal. This was done for diarrhea and rectal bleeding. Biopsies showed no microscopic colitis. He also perform upper endoscopy at the same time and this was a "rather unremarkable examination". He had perform colonoscopy for her February 2003 done for colon cancer screening. No polyps were found. He had also perform upper endoscopy February 2003 for her and that showed some mild esophagitis.   I saw her in 2013, recommended that she start fiber supplement. She cancelled her follow up appt and I have not heard from her since.   Review of systems: Pertinent positive and negative review of systems were noted in the above HPI section. Complete review of systems was performed and was otherwise normal.    Past Medical History  Diagnosis Date  . Sinus tachycardia     mild resting  . HLD (hyperlipidemia)   . Orthostatic hypotension     slight in the past  . Anxiety   . Depression   . Diverticulosis   . Fibromyalgia 1978  . Hypothyroidism   . Alopecia   . Ruptured disk     one in neck and two in back  . Plantar fasciitis   . GERD (gastroesophageal reflux disease)   . History of syncope     episode 2008--  no recurrence since  . Ureteral calculi     bilateral  . Leukocytosis   . Chronic back pain     "lower back  and upper neck" (07/28/2013)  . 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 ear   . Urgency of urination   . Hematuria   . Sensation of pressure in bladder area   . Wears glasses   . Ecchymosis     FROM IV AND LAB WORK THE PAST WEEK  06-17-2013  . Stool incontinence     "at times recently"   . Kidney stones 2014  . CAP (community acquired pneumonia)     admission 06-04-2013 secondary to failed oral antibitotics---  last cxr 06-16-2013  much improved  . SOB (shortness of breath) 06-17-2013 CURRENTLY RESIDUAL FROM RECENT CAP    SOB from obesity and deconditioning 2009- eval included CPX  . Arthritis     "spine" (07/28/2013)  . DDD (degenerative disc disease)   . CLL (chronic lymphocytic leukemia) dx'd 05/2013    "I don't think I have it though" (07/28/2013)  . Breast cancer 1997; 2014    right  . Ejection fraction     Past Surgical History  Procedure Laterality Date  . Lumbar epidural injection      has had 7 injections  . Total abdominal hysterectomy w/ bilateral salpingoophorectomy  1997  . Breast lumpectomy with axillary lymph node dissection Right 1997  . Transthoracic echocardiogram  05-11-2007  dr Ron Parker  normal lvf/  ef 65%  . Tonsillectomy  AGE 78  . Retinal detachment surgery Right 2013  . Cystoscopy with retrograde pyelogram, ureteroscopy and stent placement Bilateral 06/17/2013    Procedure: CYSTOSCOPY WITH RETROGRADE PYELOGRAM, URETEROSCOPY AND LEFT DOUBLE  J STENT PLACEMENT RIGHT URETERAL HOLMIIUM LASER AND DOUBLE J STENT ;  Surgeon: Hanley Ben, MD;  Location: [facility];  Service: Urology;  Laterality: Bilateral;  . Holmium laser application Bilateral 26/83/4196    Procedure: HOLMIUM LASER APPLICATION;  Surgeon: Hanley Ben, MD;  Location: [facility];  Service: Urology;  Laterality: Bilateral;  . Mastectomy w/ nodes partial Right 1997  . Mastectomy complete / simple Right 07/28/2013  . Appendectomy  1997  . Breast biopsy Right 2014  . Cystoscopy with  ureteroscopy, stone basketry and stent placement Bilateral 2014  . Lithotripsy Left ~ 06/2013  . Simple mastectomy with axillary sentinel node biopsy Right 07/28/2013    Procedure: RIGHT TOTAL  MASTECTOMY;  Surgeon: Imogene Burn. Georgette Dover, MD;  Location: [facility];  Service: General;  Laterality: Right;  . Portacath placement Left 07/28/2013    Procedure: ATTEMPTED INSERTION PORT-A-CATH;  Surgeon: Imogene Burn. Georgette Dover, MD;  Location: [facility];  Service: General;  Laterality: Left;    Current Outpatient Prescriptions  Medication Sig Dispense Refill  . ALPRAZolam (XANAX) 0.5 MG tablet TAKE 1 TABLET EVERY 8 HOURS AS NEEDED (Patient taking differently: TAKE 1 TABLET BY MOUTH EVERY 12 HOURS .) 30 tablet 0  . amitriptyline (ELAVIL) 150 MG tablet Take 150 mg by mouth at bedtime.    Marland Kitchen anastrozole (ARIMIDEX) 1 MG tablet TAKE 1 TABLET (1 MG TOTAL) BY MOUTH DAILY. 90 tablet 0  . Ascorbic Acid (VITAMIN C) 1000 MG tablet Take 500 mg by mouth daily.     Marland Kitchen BIOTIN PO Take by mouth.    Marland Kitchen buPROPion (WELLBUTRIN SR) 150 MG 12 hr tablet Take 150-300 mg by mouth 2 (two) times daily. Take 300mg  in the morning and 150mg  at 2pm    . Cholecalciferol (VITAMIN D-3) 1000 UNITS CAPS Take 5,000 Units by mouth daily.    . diphenhydrAMINE (BENADRYL) 25 MG tablet Take 25 mg by mouth every 6 (six) hours as needed for allergies.    Marland Kitchen ibuprofen (ADVIL,MOTRIN) 200 MG tablet Take 800 mg by mouth every 6 (six) hours as needed for moderate pain.    Marland Kitchen levothyroxine (SYNTHROID, LEVOTHROID) 50 MCG tablet Take 1 tablet (50 mcg total) by mouth daily before breakfast. 90 tablet 3  . metoprolol succinate (TOPROL-XL) 25 MG 24 hr tablet TAKE 1 TABLET (25 MG TOTAL) BY MOUTH DAILY. 30 tablet 5  . minoxidil (ROGAINE) 2 % external solution Apply 1 application topically 2 (two) times daily as needed.     . Multiple Vitamins-Minerals (HAIR VITAMINS PO) Take 1 tablet by mouth 2 (two) times daily.    . Omega-3 Fatty Acids (FISH OIL PO) Take 1 capsule by mouth daily.      Marland Kitchen omeprazole (PRILOSEC) 40 MG capsule TAKE 1 CAPSULE (40 MG TOTAL) BY MOUTH EVERY MORNING. 90 capsule 0  . Oyster Shell (OYSTER CALCIUM) 500 MG TABS tablet Take 500 mg of elemental calcium by mouth 2 (two) times daily.    Marland Kitchen UNABLE TO FIND Take 0.05 mg by mouth at bedtime. Rezulti Immune Complex w/ Zinc    . Vilazodone HCl (VIIBRYD) 20 MG TABS Take 1 tablet by mouth 2 (two) times daily.      No current facility-administered medications for this visit.  Allergies as of 10/02/2014 - Review Complete 10/02/2014  Allergen Reaction Noted  . Amoxicillin Itching 07/18/2013  . Lasix [furosemide] Nausea And Vomiting and Other (See Comments) 03/01/2013  . Lithium Other (See Comments) 03/03/2012  . Sulfa antibiotics Hives 03/01/2013  . Trazodone and nefazodone Other (See Comments) 03/03/2012  . Lyrica [pregabalin] Diarrhea, Nausea Only, and Rash     Family History  Problem Relation Age of Onset  . Heart disease Maternal Grandfather   . Diabetes Maternal Grandfather   . Colon cancer Maternal Aunt 79  . Brain cancer Paternal Grandmother     dx in 105s  . Dementia Mother   . Diabetes Mother   . Osteoporosis Mother   . Diabetes Maternal Aunt   . Prostate cancer Father 68  . Bipolar disorder Maternal Aunt   . Stroke Maternal Aunt   . Stomach cancer Paternal Uncle     dx in late 60s    History   Social History  . Marital Status: Married    Spouse Name: N/A    Number of Children: 1  . Years of Education: N/A   Occupational History  . homemaker    Social History Main Topics  . Smoking status: Never Smoker   . Smokeless tobacco: Never Used  . Alcohol Use: No     Comment: 07/28/2013 "no alcohol since 1994; never had problem w/it"  . Drug Use: No  . Sexual Activity: No     Comment: TAH/BSO   Other Topics Concern  . Not on file   Social History Narrative       Physical Exam: BP 128/70 mmHg  Pulse 86  Ht 5\' 5"  (1.651 m)  Wt 194 lb 6.4 oz (88.179 kg)  BMI 32.35 kg/m2   SpO2 96%  LMP 08/26/1995 Constitutional: generally well-appearing Psychiatric: alert and oriented x3 Eyes: extraocular movements intact Mouth: oral pharynx moist, no lesions Neck: supple no lymphadenopathy Cardiovascular: heart regular rate and rhythm Lungs: clear to auscultation bilaterally Abdomen: soft, nontender, nondistended, no obvious ascites, no peritoneal signs, normal bowel sounds Extremities: no lower extremity edema bilaterally Skin: no lesions on visible extremities    Assessment and plan: 69 y.o. female with  chronic loose stools, worse with recent chemotherapy, intermittent solid food dysphagia  I did not mention above that she does have chronic GERD that requires proton pump inhibitor once daily. If she takes the medicines daily she does not bothered by pyrosis. Despite taking it once daily she is still bothered by intermittent solid food dysphagia that is not progressive. She has chronic diarrhea worse recently with chemotherapy. I would like to proceed with colonoscopy as well as upper endoscopy for the diarrhea and intermittent dysphasia. For now I recommended she start taking Imodium on a daily scheduled basis rather than when necessary.

## 2014-10-02 NOTE — Patient Instructions (Addendum)
You will be set up for a colonoscopy in 4-5 weeks for diarrhea. You will be set up for an upper endoscopy for dysphagia. Start taking imodium, one pill every morning on a scheduled basis.

## 2014-10-03 ENCOUNTER — Ambulatory Visit (HOSPITAL_BASED_OUTPATIENT_CLINIC_OR_DEPARTMENT_OTHER)
Admission: RE | Admit: 2014-10-03 | Discharge: 2014-10-03 | Disposition: A | Discharge status: Home / Self Care | Payer: Medicare Other | Source: Ambulatory Visit | Attending: Surgery | Admitting: Surgery

## 2014-10-03 ENCOUNTER — Ambulatory Visit (HOSPITAL_BASED_OUTPATIENT_CLINIC_OR_DEPARTMENT_OTHER): Payer: Medicare Other | Admitting: Certified Registered"

## 2014-10-03 ENCOUNTER — Encounter (HOSPITAL_BASED_OUTPATIENT_CLINIC_OR_DEPARTMENT_OTHER)
Admission: RE | Disposition: A | Discharge status: Home / Self Care | Payer: Self-pay | Source: Ambulatory Visit | Attending: Surgery

## 2014-10-03 ENCOUNTER — Encounter (HOSPITAL_BASED_OUTPATIENT_CLINIC_OR_DEPARTMENT_OTHER): Payer: Self-pay | Admitting: *Deleted

## 2014-10-03 DIAGNOSIS — Z87442 Personal history of urinary calculi: Secondary | ICD-10-CM | POA: Diagnosis not present

## 2014-10-03 DIAGNOSIS — C50911 Malignant neoplasm of unspecified site of right female breast: Secondary | ICD-10-CM | POA: Diagnosis not present

## 2014-10-03 DIAGNOSIS — K219 Gastro-esophageal reflux disease without esophagitis: Secondary | ICD-10-CM | POA: Insufficient documentation

## 2014-10-03 DIAGNOSIS — M4690 Unspecified inflammatory spondylopathy, site unspecified: Secondary | ICD-10-CM | POA: Insufficient documentation

## 2014-10-03 DIAGNOSIS — I951 Orthostatic hypotension: Secondary | ICD-10-CM | POA: Insufficient documentation

## 2014-10-03 DIAGNOSIS — E785 Hyperlipidemia, unspecified: Secondary | ICD-10-CM | POA: Diagnosis not present

## 2014-10-03 DIAGNOSIS — G8929 Other chronic pain: Secondary | ICD-10-CM | POA: Diagnosis not present

## 2014-10-03 DIAGNOSIS — F329 Major depressive disorder, single episode, unspecified: Secondary | ICD-10-CM | POA: Insufficient documentation

## 2014-10-03 DIAGNOSIS — E039 Hypothyroidism, unspecified: Secondary | ICD-10-CM | POA: Diagnosis not present

## 2014-10-03 DIAGNOSIS — M549 Dorsalgia, unspecified: Secondary | ICD-10-CM | POA: Diagnosis not present

## 2014-10-03 DIAGNOSIS — M797 Fibromyalgia: Secondary | ICD-10-CM | POA: Diagnosis not present

## 2014-10-03 DIAGNOSIS — Z79899 Other long term (current) drug therapy: Secondary | ICD-10-CM | POA: Diagnosis not present

## 2014-10-03 DIAGNOSIS — Z791 Long term (current) use of non-steroidal anti-inflammatories (NSAID): Secondary | ICD-10-CM | POA: Diagnosis not present

## 2014-10-03 HISTORY — PX: PORT-A-CATH REMOVAL: SHX5289

## 2014-10-03 SURGERY — REMOVAL PORT-A-CATH
Anesthesia: Monitor Anesthesia Care | Site: Chest | Laterality: Left

## 2014-10-03 MED ORDER — MORPHINE SULFATE 2 MG/ML IJ SOLN: 2.0000 mg | INTRAMUSCULAR | Status: DC | PRN

## 2014-10-03 MED ORDER — ONDANSETRON HCL 4 MG/2ML IJ SOLN: 4.0000 mg | INTRAMUSCULAR | Status: DC | PRN

## 2014-10-03 MED ORDER — LIDOCAINE HCL (CARDIAC) 20 MG/ML IV SOLN
INTRAVENOUS | Status: DC | PRN
  Administered 2014-10-03: 25 mg via INTRAVENOUS

## 2014-10-03 MED ORDER — FENTANYL CITRATE 0.05 MG/ML IJ SOLN
INTRAMUSCULAR | Status: AC
  Filled 2014-10-03: qty 6

## 2014-10-03 MED ORDER — MEPERIDINE HCL 25 MG/ML IJ SOLN: 6.2500 mg | INTRAMUSCULAR | Status: DC | PRN

## 2014-10-03 MED ORDER — PROPOFOL INFUSION 10 MG/ML OPTIME
INTRAVENOUS | Status: DC | PRN
  Administered 2014-10-03: 100 ug/kg/min via INTRAVENOUS

## 2014-10-03 MED ORDER — FENTANYL CITRATE 0.05 MG/ML IJ SOLN
INTRAMUSCULAR | Status: DC | PRN
  Administered 2014-10-03 (×2): 25 ug via INTRAVENOUS
  Administered 2014-10-03: 50 ug via INTRAVENOUS

## 2014-10-03 MED ORDER — MIDAZOLAM HCL 2 MG/2ML IJ SOLN
INTRAMUSCULAR | Status: AC
  Filled 2014-10-03: qty 2

## 2014-10-03 MED ORDER — BUPIVACAINE-EPINEPHRINE (PF) 0.5% -1:200000 IJ SOLN
INTRAMUSCULAR | Status: AC
  Filled 2014-10-03: qty 30

## 2014-10-03 MED ORDER — LACTATED RINGERS IV SOLN
INTRAVENOUS | Status: DC
  Administered 2014-10-03: 08:00:00 via INTRAVENOUS

## 2014-10-03 MED ORDER — HYDROMORPHONE HCL 1 MG/ML IJ SOLN: 0.2500 mg | INTRAMUSCULAR | Status: DC | PRN

## 2014-10-03 MED ORDER — HYDROCODONE-ACETAMINOPHEN 5-325 MG PO TABS: 1.0000 | ORAL_TABLET | ORAL | Status: DC | PRN

## 2014-10-03 MED ORDER — LIDOCAINE HCL 1 % IJ SOLN
INTRAMUSCULAR | Status: DC | PRN
  Administered 2014-10-03: 10 mL

## 2014-10-03 MED ORDER — MIDAZOLAM HCL 2 MG/2ML IJ SOLN: 1.0000 mg | INTRAMUSCULAR | Status: DC | PRN

## 2014-10-03 MED ORDER — FENTANYL CITRATE 0.05 MG/ML IJ SOLN: 50.0000 ug | INTRAMUSCULAR | Status: DC | PRN

## 2014-10-03 MED ORDER — BUPIVACAINE-EPINEPHRINE (PF) 0.25% -1:200000 IJ SOLN
INTRAMUSCULAR | Status: AC
  Filled 2014-10-03: qty 60

## 2014-10-03 MED ORDER — PROPOFOL 10 MG/ML IV EMUL
INTRAVENOUS | Status: AC
  Filled 2014-10-03: qty 50

## 2014-10-03 MED ORDER — ONDANSETRON HCL 4 MG/2ML IJ SOLN: 4.0000 mg | Freq: Once | INTRAMUSCULAR | Status: DC | PRN

## 2014-10-03 MED ORDER — LIDOCAINE HCL (PF) 1 % IJ SOLN
INTRAMUSCULAR | Status: AC
  Filled 2014-10-03: qty 30

## 2014-10-03 MED ORDER — CHLORHEXIDINE GLUCONATE 4 % EX LIQD: 1.0000 "application " | Freq: Once | CUTANEOUS | Status: DC

## 2014-10-03 MED ORDER — MIDAZOLAM HCL 5 MG/5ML IJ SOLN
INTRAMUSCULAR | Status: DC | PRN
  Administered 2014-10-03: 2 mg via INTRAVENOUS

## 2014-10-03 SURGICAL SUPPLY — 41 items
APL SKNCLS STERI-STRIP NONHPOA (GAUZE/BANDAGES/DRESSINGS) ×1
APPLICATOR COTTON TIP 6IN STRL (MISCELLANEOUS) IMPLANT
BENZOIN TINCTURE PRP APPL 2/3 (GAUZE/BANDAGES/DRESSINGS) ×2 IMPLANT
BLADE HEX COATED 2.75 (ELECTRODE) ×2 IMPLANT
BLADE SURG 15 STRL LF DISP TIS (BLADE) ×1 IMPLANT
BLADE SURG 15 STRL SS (BLADE) ×2
CANISTER SUCT 1200ML W/VALVE (MISCELLANEOUS) IMPLANT
CHLORAPREP W/TINT 26ML (MISCELLANEOUS) ×2 IMPLANT
COVER BACK TABLE 60X90IN (DRAPES) ×2 IMPLANT
COVER MAYO STAND STRL (DRAPES) ×2 IMPLANT
DECANTER SPIKE VIAL GLASS SM (MISCELLANEOUS) ×2 IMPLANT
DRAPE LAPAROTOMY 100X72 PEDS (DRAPES) ×2 IMPLANT
DRAPE UTILITY XL STRL (DRAPES) ×2 IMPLANT
DRSG TEGADERM 4X4.75 (GAUZE/BANDAGES/DRESSINGS) ×2 IMPLANT
ELECT REM PT RETURN 9FT ADLT (ELECTROSURGICAL) ×2
ELECTRODE REM PT RTRN 9FT ADLT (ELECTROSURGICAL) ×1 IMPLANT
GLOVE BIO SURGEON STRL SZ 6.5 (GLOVE) ×1 IMPLANT
GLOVE BIO SURGEON STRL SZ7 (GLOVE) ×3 IMPLANT
GLOVE BIOGEL PI IND STRL 7.0 (GLOVE) IMPLANT
GLOVE BIOGEL PI IND STRL 7.5 (GLOVE) ×1 IMPLANT
GLOVE BIOGEL PI INDICATOR 7.0 (GLOVE) ×1
GLOVE BIOGEL PI INDICATOR 7.5 (GLOVE) ×1
GOWN STRL REUS W/ TWL LRG LVL3 (GOWN DISPOSABLE) ×1 IMPLANT
GOWN STRL REUS W/TWL LRG LVL3 (GOWN DISPOSABLE) ×2
NDL HYPO 25X1 1.5 SAFETY (NEEDLE) ×1 IMPLANT
NEEDLE HYPO 25X1 1.5 SAFETY (NEEDLE) ×2 IMPLANT
NS IRRIG 1000ML POUR BTL (IV SOLUTION) IMPLANT
PACK BASIN DAY SURGERY FS (CUSTOM PROCEDURE TRAY) ×2 IMPLANT
PENCIL BUTTON HOLSTER BLD 10FT (ELECTRODE) ×2 IMPLANT
SPONGE GAUZE 2X2 8PLY STRL LF (GAUZE/BANDAGES/DRESSINGS) IMPLANT
SPONGE GAUZE 4X4 12PLY STER LF (GAUZE/BANDAGES/DRESSINGS) IMPLANT
SPONGE LAP 4X18 X RAY DECT (DISPOSABLE) ×2 IMPLANT
STRIP CLOSURE SKIN 1/2X4 (GAUZE/BANDAGES/DRESSINGS) ×2 IMPLANT
SUT MON AB 4-0 PC3 18 (SUTURE) ×2 IMPLANT
SUT VIC AB 3-0 SH 27 (SUTURE) ×2
SUT VIC AB 3-0 SH 27X BRD (SUTURE) ×1 IMPLANT
SYR CONTROL 10ML LL (SYRINGE) ×2 IMPLANT
TOWEL OR 17X24 6PK STRL BLUE (TOWEL DISPOSABLE) ×4 IMPLANT
TOWEL OR NON WOVEN STRL DISP B (DISPOSABLE) ×2 IMPLANT
TUBE CONNECTING 20X1/4 (TUBING) IMPLANT
YANKAUER SUCT BULB TIP NO VENT (SUCTIONS) IMPLANT

## 2014-10-03 NOTE — Anesthesia Procedure Notes (Signed)
Procedure Name: MAC Date/Time: 10/03/2014 7:48 AM Performed by: Baxter Flattery Pre-anesthesia Checklist: Patient identified, Emergency Drugs available, Suction available and Patient being monitored Patient Re-evaluated:Patient Re-evaluated prior to inductionOxygen Delivery Method: Simple face mask Preoxygenation: Pre-oxygenation with 100% oxygen Intubation Type: IV induction Dental Injury: Teeth and Oropharynx as per pre-operative assessment

## 2014-10-03 NOTE — H&P (Signed)
[name] [name] is an 69 y.o. female.   Chief Complaint: Port removal  HPI: 70 yo female with recurrent right breast cancer s/p mastectomy in 2014.  She has completed her chemotherapy and presents now for port removal.  The port was placed by interventional radiology in her right IJ.  Past Medical History  Diagnosis Date  . Sinus tachycardia     mild resting  . HLD (hyperlipidemia)   . Orthostatic hypotension     slight in the past  . Anxiety   . Depression   . Diverticulosis   . Fibromyalgia 1978  . Hypothyroidism   . Alopecia   . Ruptured disk     one in neck and two in back  . Plantar fasciitis   . GERD (gastroesophageal reflux disease)   . History of syncope     episode 2008--  no recurrence since  . Ureteral calculi     bilateral  . Leukocytosis   . Chronic back pain     "lower back and upper neck" (07/28/2013)  . 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 ear   . Urgency of urination   . Hematuria   . Sensation of pressure in bladder area   . Wears glasses   . Ecchymosis     FROM IV AND LAB WORK THE PAST WEEK  06-17-2013  . Stool incontinence     "at times recently"   . Kidney stones 2014  . CAP (community acquired pneumonia)     admission 06-04-2013 secondary to failed oral antibitotics---  last cxr 06-16-2013  much improved  . SOB (shortness of breath) 06-17-2013 CURRENTLY RESIDUAL FROM RECENT CAP    SOB from obesity and deconditioning 2009- eval included CPX  . Arthritis     "spine" (07/28/2013)  . DDD (degenerative disc disease)   . CLL (chronic lymphocytic leukemia) dx'd 05/2013    "I don't think I have it though" (07/28/2013)  . Breast cancer 1997; 2014    right  . Ejection fraction     Past Surgical History  Procedure Laterality Date  . Lumbar epidural injection      has had 7 injections  . Total abdominal hysterectomy w/ bilateral salpingoophorectomy  1997  . Breast lumpectomy with axillary lymph node dissection Right 1997  . Transthoracic echocardiogram  05-11-2007  dr  Ron Parker    normal lvf/  ef 65%  . Tonsillectomy  AGE 23  . Retinal detachment surgery Right 2013  . Cystoscopy with retrograde pyelogram, ureteroscopy and stent placement Bilateral 06/17/2013    Procedure: CYSTOSCOPY WITH RETROGRADE PYELOGRAM, URETEROSCOPY AND LEFT DOUBLE  J STENT PLACEMENT RIGHT URETERAL HOLMIIUM LASER AND DOUBLE J STENT ;  Surgeon: Hanley Ben, MD;  Location: [facility];  Service: Urology;  Laterality: Bilateral;  . Holmium laser application Bilateral 02/15/7627    Procedure: HOLMIUM LASER APPLICATION;  Surgeon: Hanley Ben, MD;  Location: [facility];  Service: Urology;  Laterality: Bilateral;  . Mastectomy w/ nodes partial Right 1997  . Mastectomy complete / simple Right 07/28/2013  . Appendectomy  1997  . Breast biopsy Right 2014  . Cystoscopy with ureteroscopy, stone basketry and stent placement Bilateral 2014  . Lithotripsy Left ~ 06/2013  . Simple mastectomy with axillary sentinel node biopsy Right 07/28/2013    Procedure: RIGHT TOTAL  MASTECTOMY;  Surgeon: Imogene Burn. Georgette Dover, MD;  Location: [facility];  Service: General;  Laterality: Right;  . Portacath placement Left 07/28/2013    Procedure: ATTEMPTED INSERTION PORT-A-CATH;  Surgeon: Rodman Key  Oren Section, MD;  Location: [facility];  Service: General;  Laterality: Left;    Family History  Problem Relation Age of Onset  . Heart disease Maternal Grandfather   . Diabetes Maternal Grandfather   . Colon cancer Maternal Aunt 79  . Brain cancer Paternal Grandmother     dx in 19s  . Dementia Mother   . Diabetes Mother   . Osteoporosis Mother   . Diabetes Maternal Aunt   . Prostate cancer Father 62  . Bipolar disorder Maternal Aunt   . Stroke Maternal Aunt   . Stomach cancer Paternal Uncle     dx in late 106s   Social History:  reports that she has never smoked. She has never used smokeless tobacco. She reports that she does not drink alcohol or use illicit drugs.  Allergies:  Allergies   Allergen Reactions  . Amoxicillin Itching  . Lasix [Furosemide] Nausea And Vomiting and Other (See Comments)    headache  . Lithium Other (See Comments)    Dizziness, "cause me to fall"  . Sulfa Antibiotics Hives  . Trazodone And Nefazodone Other (See Comments)    insomnia  . Lyrica [Pregabalin] Diarrhea, Nausea Only and Rash    Medications Prior to Admission  Medication Sig Dispense Refill  . ALPRAZolam (XANAX) 0.5 MG tablet TAKE 1 TABLET EVERY 8 HOURS AS NEEDED (Patient taking differently: TAKE 1 TABLET BY MOUTH EVERY 12 HOURS .) 30 tablet 0  . amitriptyline (ELAVIL) 150 MG tablet Take 150 mg by mouth at bedtime.    Marland Kitchen anastrozole (ARIMIDEX) 1 MG tablet TAKE 1 TABLET (1 MG TOTAL) BY MOUTH DAILY. 90 tablet 0  . Ascorbic Acid (VITAMIN C) 1000 MG tablet Take 500 mg by mouth daily.     Marland Kitchen BIOTIN PO Take by mouth.    Marland Kitchen buPROPion (WELLBUTRIN SR) 150 MG 12 hr tablet Take 150-300 mg by mouth 2 (two) times daily. Take 35m in the morning and 151mat 2pm    . Cholecalciferol (VITAMIN D-3) 1000 UNITS CAPS Take 5,000 Units by mouth daily.    . diphenhydrAMINE (BENADRYL) 25 MG tablet Take 25 mg by mouth every 6 (six) hours as needed for allergies.    . Marland Kitchenbuprofen (ADVIL,MOTRIN) 200 MG tablet Take 800 mg by mouth every 6 (six) hours as needed for moderate pain.    . Marland Kitchenevothyroxine (SYNTHROID, LEVOTHROID) 50 MCG tablet Take 1 tablet (50 mcg total) by mouth daily before breakfast. 90 tablet 3  . metoprolol succinate (TOPROL-XL) 25 MG 24 hr tablet TAKE 1 TABLET (25 MG TOTAL) BY MOUTH DAILY. 30 tablet 5  . minoxidil (ROGAINE) 2 % external solution Apply 1 application topically 2 (two) times daily as needed.     . Multiple Vitamins-Minerals (HAIR VITAMINS PO) Take 1 tablet by mouth 2 (two) times daily.    . Omega-3 Fatty Acids (FISH OIL PO) Take 1 capsule by mouth daily.     . Marland Kitchenmeprazole (PRILOSEC) 40 MG capsule TAKE 1 CAPSULE (40 MG TOTAL) BY MOUTH EVERY MORNING. 90 capsule 0  . Oyster Shell (OYSTER  CALCIUM) 500 MG TABS tablet Take 500 mg of elemental calcium by mouth 2 (two) times daily.    . peg 3350 powder (MOVIPREP) 100 G SOLR Take 1 kit (200 g total) by mouth once. 1 kit 0  . UNABLE TO FIND Take 0.05 mg by mouth at bedtime. Rezulti Immune Complex w/ Zinc    . Vilazodone HCl (VIIBRYD) 20 MG TABS Take 1 tablet by mouth  2 (two) times daily.       No results found for this or any previous visit (from the past 48 hour(s)). No results found.  ROS  Height _0  (1.651 m), weight 198 lb (89.812 kg), last menstrual period 08/26/1995. Physical Exam  WDWN in NAD Right mastectomy site well-healed. Left infraclavicular port -no sign of infection.  Assessment/Plan Port removal today.  The surgical procedure has been discussed with the patient.  Potential risks, benefits, alternative treatments, and expected outcomes have been explained.  All of the patient's questions at this time have been answered.  The likelihood of reaching the patient's treatment goal is good.  The patient understand the proposed surgical procedure and wishes to proceed.   Cacie Gaskins K. 10/03/2014, 7:13 AM

## 2014-10-03 NOTE — Transfer of Care (Signed)
Immediate Anesthesia Transfer of Care Note  Patient: [name] [name]  Procedure(s) Performed: Procedure(s): REMOVAL PORT-A-CATH (Left)  Patient Location: PACU  Anesthesia Type:MAC  Level of Consciousness: awake, alert  and oriented  Airway & Oxygen Therapy: Patient Spontanous Breathing and Patient connected to face mask oxygen  Post-op Assessment: Report given to RN, Post -op Vital signs reviewed and stable and Patient moving all extremities  Post vital signs: Reviewed and stable  Last Vitals:  Filed Vitals:   10/03/14 0721  BP: 134/84  Pulse: 88  Temp: 36.9 C  Resp: 18    Complications: No apparent anesthesia complications

## 2014-10-03 NOTE — Discharge Instructions (Signed)
PORT-A-CATH REMOVAL: POST OP INSTRUCTIONS  Always review your discharge instruction sheet given to you by the facility where your surgery was performed.   1. A prescription for pain medication may be given to you upon discharge. Take your pain medication as prescribed, if needed. If narcotic pain medicine is not needed, then you make take acetaminophen (Tylenol) or ibuprofen (Advil) as needed.  2. Take your usually prescribed medications unless otherwise directed. 3. If you need a refill on your pain medication, please contact our office. All narcotic pain medicine now requires a paper prescription.  Phoned in and fax refills are no longer allowed by law.  Prescriptions will not be filled after 5 pm or on weekends.  4. You should follow a light diet for the remainder of the day after your procedure. 5. Most patients will experience some mild swelling and/or bruising in the area of the incision. It may take several days to resolve. 6. It is common to experience some constipation if taking pain medication after surgery. Increasing fluid intake and taking a stool softener (such as Colace) will usually help or prevent this problem from occurring. A mild laxative (Milk of Magnesia or Miralax) should be taken according to package directions if there are no bowel movements after 48 hours.  7. Unless discharge instructions indicate otherwise, you may remove your bandages 48 hours after surgery, and you may shower at that time. You may have steri-strips (small white skin tapes) in place directly over the incision.  These strips should be left on the skin for 7-10 days.  8. ACTIVITIES:  Limit activity involving your arms for the next 72 hours. Do no strenuous exercise or activity for 1 week. You may drive when you are no longer taking prescription pain medication, you can comfortably wear a seatbelt, and you can maneuver your car. 10.You may need to see your doctor in the office for a follow-up appointment.   Please       check with your doctor.    WHEN TO CALL YOUR DOCTOR [phone]): 1. Fever over 101.0 2. Chills 3. Continued bleeding from incision 4. Increased redness and tenderness at the site 5. Shortness of breath, difficulty breathing   The clinic staff is available to answer your questions during regular business hours. Please dont hesitate to call and ask to speak to one of the nurses or medical assistants for clinical concerns. If you have a medical emergency, go to the nearest emergency room or call 911.  A surgeon from [facility] is always on call at the hospital.     For further information, please visit [email or web address]      Post Anesthesia Home Care Instructions  Activity: Get plenty of rest for the remainder of the day. A responsible adult should stay with you for 24 hours following the procedure.  For the next 24 hours, DO NOT: -Drive a car -Paediatric nurse -Drink alcoholic beverages -Take any medication unless instructed by your physician -Make any legal decisions or sign important papers.  Meals: Start with liquid foods such as gelatin or soup. Progress to regular foods as tolerated. Avoid greasy, spicy, heavy foods. If nausea and/or vomiting occur, drink only clear liquids until the nausea and/or vomiting subsides. Call your physician if vomiting continues.  Special Instructions/Symptoms: Your throat may feel dry or sore from the anesthesia or the breathing tube placed in your throat during surgery. If this causes discomfort, gargle with warm salt water. The discomfort should disappear within 24  hours.     

## 2014-10-03 NOTE — Anesthesia Preprocedure Evaluation (Signed)
Anesthesia Evaluation  Patient identified by MRN, date of birth, ID band Patient awake    Reviewed: Allergy & Precautions, NPO status , Patient's Chart, lab work & pertinent test results  Airway Mallampati: I  TM Distance: >3 FB Neck ROM: Full    Dental   Pulmonary          Cardiovascular     Neuro/Psych Anxiety Depression    GI/Hepatic GERD-  Medicated and Controlled,  Endo/Other  Hypothyroidism   Renal/GU      Musculoskeletal   Abdominal   Peds  Hematology   Anesthesia Other Findings   Reproductive/Obstetrics                             Anesthesia Physical Anesthesia Plan  ASA: III  Anesthesia Plan: MAC   Post-op Pain Management:    Induction: Intravenous  Airway Management Planned: Simple Face Mask  Additional Equipment:   Intra-op Plan:   Post-operative Plan: Extubation in OR  Informed Consent: I have reviewed the patients History and Physical, chart, labs and discussed the procedure including the risks, benefits and alternatives for the proposed anesthesia with the patient or authorized representative who has indicated his/her understanding and acceptance.     Plan Discussed with: CRNA and Surgeon  Anesthesia Plan Comments:         Anesthesia Quick Evaluation

## 2014-10-03 NOTE — Anesthesia Postprocedure Evaluation (Signed)
Anesthesia Post Note  Patient: [name] [name]  Procedure(s) Performed: Procedure(s) (LRB): REMOVAL PORT-A-CATH (Left)  Anesthesia type: general  Patient location: PACU  Post pain: Pain level controlled  Post assessment: Patient's Cardiovascular Status Stable  Last Vitals:  Filed Vitals:   10/03/14 0923  BP: 135/70  Pulse: 79  Temp: 36.5 C  Resp: 18    Post vital signs: Reviewed and stable  Level of consciousness: sedated  Complications: No apparent anesthesia complications

## 2014-10-03 NOTE — Op Note (Signed)
Pre-op Diagnosis:  Right breast cancer Post-op Diagnosis:  Same Procedure:  Left port removal Surgeon:  Ayodele Hartsock K. Anesthesia:  Local-MAC Indications:  69 yo female with recurrent right breast cancer s/p mastectomy presents for port removal after completion of her chemotherapy.   Description of procedure:  The patient was brought to the operating room and placed in a supine position on the operating room table.  After an adequate level of  intravenous sedation was given, her left chest and neck were prepped with ChloraPrep and draped in sterile fashion. A timeout was taken to ensure the proper patient and proper procedure. the area over the port was infiltrated with a 11 mixture 1% lidocaine and 0.25% Marcaine with epinephrine. After this area was numb a transverse incision was made through the old scar. Dissection was carried down through the subcutaneous tissues with cautery until we identified the hope of the port. The catheter was grasped with a hemostat. The catheter was removed from its tunnel running up to the internal jugular vein. Direct pressure was held over the insertion site at the lower neck for several minutes. No sign of hematoma was noted. The 2 sutures were cut and the port was removed. Cautery was used for hemostasis. The deep tissues of the Seb Ks pocket were closed with 3-0 Vicryl. 4 Monocryl was used to close skin. Steri-Strips and clean dressings were applied. The patient was then asked patient about the recovery room in stable condition. All sponge, instrument, needle counts are correct.  Imogene Burn. Georgette Dover, MD, [facility]  General/ Trauma Surgery  10/03/2014 8:23 AM

## 2014-10-04 ENCOUNTER — Encounter (HOSPITAL_BASED_OUTPATIENT_CLINIC_OR_DEPARTMENT_OTHER): Payer: Self-pay | Admitting: Surgery

## 2014-10-16 ENCOUNTER — Encounter: Payer: Self-pay | Admitting: Gastroenterology

## 2014-10-18 ENCOUNTER — Other Ambulatory Visit: Payer: Self-pay | Admitting: Oncology

## 2014-10-23 ENCOUNTER — Telehealth: Payer: Self-pay | Admitting: Emergency Medicine

## 2014-10-23 NOTE — Telephone Encounter (Signed)
Patient will need to come in and be seen. Tried to call and inform patient, VM not accepting calls at this time.

## 2014-10-23 NOTE — Telephone Encounter (Signed)
Patient called and states that she has a sore throat, runny nose, and achy all over. She wants a Z-pack called in to her pharmacy. Told patient it is best if she can come in to be seen before prescribe her some medication. Her last OV was 04/11/2014. Please advise. [facility]  [phone]

## 2014-10-24 NOTE — Telephone Encounter (Signed)
Spoke with pt, advised her to RTC. Pt understood. 

## 2014-10-29 ENCOUNTER — Ambulatory Visit (INDEPENDENT_AMBULATORY_CARE_PROVIDER_SITE_OTHER): Payer: Medicare Other | Admitting: Family Medicine

## 2014-10-29 VITALS — BP 120/70 | HR 95 | Temp 97.8°F | Resp 20 | Ht 64.25 in | Wt 197.2 lb

## 2014-10-29 DIAGNOSIS — R05 Cough: Secondary | ICD-10-CM

## 2014-10-29 DIAGNOSIS — J069 Acute upper respiratory infection, unspecified: Secondary | ICD-10-CM

## 2014-10-29 DIAGNOSIS — R059 Cough, unspecified: Secondary | ICD-10-CM

## 2014-10-29 MED ORDER — AZITHROMYCIN 250 MG PO TABS: ORAL_TABLET | ORAL | Status: DC

## 2014-10-29 NOTE — Patient Instructions (Signed)
Saline nasal spray atleast 4 times per day, over the counter mucinex or mucinex DM if needed for cough.  If cough not improving tomorrow - can start Zpak.  Return to the clinic or go to the nearest emergency room if any of your symptoms worsen or new symptoms occur.  Cough, Adult  A cough is a reflex that helps clear your throat and airways. It can help heal the body or may be a reaction to an irritated airway. A cough may only last 2 or 3 weeks (acute) or may last more than 8 weeks (chronic).  CAUSES Acute cough:  Viral or bacterial infections. Chronic cough:  Infections.  Allergies.  Asthma.  Post-nasal drip.  Smoking.  Heartburn or acid reflux.  Some medicines.  Chronic lung problems (COPD).  Cancer. SYMPTOMS   Cough.  Fever.  Chest pain.  Increased breathing rate.  High-pitched whistling sound when breathing (wheezing).  Colored mucus that you cough up (sputum). TREATMENT   A bacterial cough may be treated with antibiotic medicine.  A viral cough must run its course and will not respond to antibiotics.  Your caregiver may recommend other treatments if you have a chronic cough. HOME CARE INSTRUCTIONS   Only take over-the-counter or prescription medicines for pain, discomfort, or fever as directed by your caregiver. Use cough suppressants only as directed by your caregiver.  Use a cold steam vaporizer or humidifier in your bedroom or home to help loosen secretions.  Sleep in a semi-upright position if your cough is worse at night.  Rest as needed.  Stop smoking if you smoke. SEEK IMMEDIATE MEDICAL CARE IF:   You have pus in your sputum.  Your cough starts to worsen.  You cannot control your cough with suppressants and are losing sleep.  You begin coughing up blood.  You have difficulty breathing.  You develop pain which is getting worse or is uncontrolled with medicine.  You have a fever. MAKE SURE YOU:   Understand these  instructions.  Will watch your condition.  Will get help right away if you are not doing well or get worse. Document Released: 02/07/2011 Document Revised: 11/03/2011 Document Reviewed: 02/07/2011 [facility] Patient Information 2015 [place], [place]. This information is not intended to replace advice given to you by your health care provider. Make sure you discuss any questions you have with your health care provider.  Upper Respiratory Infection, Adult An upper respiratory infection (URI) is also sometimes known as the common cold. The upper respiratory tract includes the nose, sinuses, throat, trachea, and bronchi. Bronchi are the airways leading to the lungs. Most people improve within 1 week, but symptoms can last up to 2 weeks. A residual cough may last even longer.  CAUSES Many different viruses can infect the tissues lining the upper respiratory tract. The tissues become irritated and inflamed and often become very moist. Mucus production is also common. A cold is contagious. You can easily spread the virus to others by oral contact. This includes kissing, sharing a glass, coughing, or sneezing. Touching your mouth or nose and then touching a surface, which is then touched by another person, can also spread the virus. SYMPTOMS  Symptoms typically develop 1 to 3 days after you come in contact with a cold virus. Symptoms vary from person to person. They may include:  Runny nose.  Sneezing.  Nasal congestion.  Sinus irritation.  Sore throat.  Loss of voice (laryngitis).  Cough.  Fatigue.  Muscle aches.  Loss of appetite.  Headache.  Low-grade fever. DIAGNOSIS  You might diagnose your own cold based on familiar symptoms, since most people get a cold 2 to 3 times a year. Your caregiver can confirm this based on your exam. Most importantly, your caregiver can check that your symptoms are not due to another disease such as strep throat, sinusitis, pneumonia, asthma, or  epiglottitis. Blood tests, throat tests, and X-rays are not necessary to diagnose a common cold, but they may sometimes be helpful in excluding other more serious diseases. Your caregiver will decide if any further tests are required. RISKS AND COMPLICATIONS  You may be at risk for a more severe case of the common cold if you smoke cigarettes, have chronic heart disease (such as heart failure) or lung disease (such as asthma), or if you have a weakened immune system. The very young and very old are also at risk for more serious infections. Bacterial sinusitis, middle ear infections, and bacterial pneumonia can complicate the common cold. The common cold can worsen asthma and chronic obstructive pulmonary disease (COPD). Sometimes, these complications can require emergency medical care and may be life-threatening. PREVENTION  The best way to protect against getting a cold is to practice good hygiene. Avoid oral or hand contact with people with cold symptoms. Wash your hands often if contact occurs. There is no clear evidence that vitamin C, vitamin E, echinacea, or exercise reduces the chance of developing a cold. However, it is always recommended to get plenty of rest and practice good nutrition. TREATMENT  Treatment is directed at relieving symptoms. There is no cure. Antibiotics are not effective, because the infection is caused by a virus, not by bacteria. Treatment may include:  Increased fluid intake. Sports drinks offer valuable electrolytes, sugars, and fluids.  Breathing heated mist or steam (vaporizer or shower).  Eating chicken soup or other clear broths, and maintaining good nutrition.  Getting plenty of rest.  Using gargles or lozenges for comfort.  Controlling fevers with ibuprofen or acetaminophen as directed by your caregiver.  Increasing usage of your inhaler if you have asthma. Zinc gel and zinc lozenges, taken in the first 24 hours of the common cold, can shorten the duration  and lessen the severity of symptoms. Pain medicines may help with fever, muscle aches, and throat pain. A variety of non-prescription medicines are available to treat congestion and runny nose. Your caregiver can make recommendations and may suggest nasal or lung inhalers for other symptoms.  HOME CARE INSTRUCTIONS   Only take over-the-counter or prescription medicines for pain, discomfort, or fever as directed by your caregiver.  Use a warm mist humidifier or inhale steam from a shower to increase air moisture. This may keep secretions moist and make it easier to breathe.  Drink enough water and fluids to keep your urine clear or pale yellow.  Rest as needed.  Return to work when your temperature has returned to normal or as your caregiver advises. You may need to stay home longer to avoid infecting others. You can also use a face mask and careful hand washing to prevent spread of the virus. SEEK MEDICAL CARE IF:   After the first few days, you feel you are getting worse rather than better.  You need your caregiver's advice about medicines to control symptoms.  You develop chills, worsening shortness of breath, or brown or red sputum. These may be signs of pneumonia.  You develop yellow or brown nasal discharge or pain in the face, especially when you bend  forward. These may be signs of sinusitis.  You develop a fever, swollen neck glands, pain with swallowing, or white areas in the back of your throat. These may be signs of strep throat. SEEK IMMEDIATE MEDICAL CARE IF:   You have a fever.  You develop severe or persistent headache, ear pain, sinus pain, or chest pain.  You develop wheezing, a prolonged cough, cough up blood, or have a change in your usual mucus (if you have chronic lung disease).  You develop sore muscles or a stiff neck. Document Released: 02/04/2001 Document Revised: 11/03/2011 Document Reviewed: 11/16/2013 [facility] Patient Information 2015 [place], [place]. This  information is not intended to replace advice given to you by your health care provider. Make sure you discuss any questions you have with your health care provider.

## 2014-10-29 NOTE — Progress Notes (Signed)
Subjective:    Patient ID: [name] [name], female    DOB: [birth year], 69 y.o.   MRN: [id number] This chart was scribed for Wendie Agreste, MD by Martinique Peace, ED Scribe. The patient was seen in [facility]01. The patient's [name] was started at 3:19 PM.  HPI HPI Comments: [name] [name] is a 69 y.o. female who presents to the Emergency Department complaining of sore throat onset 2 weeks ago that resolved after about 5 or 5 days before another sore throat started up 7 or 8 days ago that has continued into today. She also complains of cough and congestion with sputum described as greenish color. Pt explains current cough feels "deeper" than previously. She adds that symptoms don't seem to be getting worse but just aren't showing any improvement. No complaints of fever within the past week. Pt further reports her husband is just getting over a cold as well.   History of multiple medical problems including breast cancer. Status: Post-chemotherapy. Under went surgery 1 month ago for removal of portacath.     Patient Active Problem List   Diagnosis Date Noted  . Ejection fraction   . Anemia, unspecified 10/25/2013  . Diarrhea 10/04/2013  . GERD (gastroesophageal reflux disease) 09/27/2013  . Seroma complicating a procedure 08/12/2013  . CLL (chronic lymphocytic leukemia) 07/27/2013  . Breast cancer of lower-outer quadrant of right female breast 07/26/2013  . Nephrolithiasis 06/16/2013  . 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 Chronic pain syndrome 05/10/2013  . H/O long-term treatment with high-risk medication 05/10/2013  . DDD (degenerative disc disease) 05/10/2013  . SOB (shortness of breath)   . Sinus tachycardia   . HLD (hyperlipidemia)   . Orthostatic hypotension   . Anxiety   . Arthritis   . Diverticulosis   . Fibromyalgia   . Hypothyroidism    Past Medical History  Diagnosis Date  . Sinus tachycardia     mild resting  . HLD (hyperlipidemia)   . Orthostatic hypotension     slight  in the past  . Anxiety   . Depression   . Diverticulosis   . Fibromyalgia 1978  . Hypothyroidism   . Alopecia   . Ruptured disk     one in neck and two in back  . Plantar fasciitis   . GERD (gastroesophageal reflux disease)   . History of syncope     episode 2008--  no recurrence since  . Ureteral calculi     bilateral  . Leukocytosis   . Chronic back pain     "lower back and upper neck" (07/28/2013)  . 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 ear   . Urgency of urination   . Hematuria   . Sensation of pressure in bladder area   . Wears glasses   . Ecchymosis     FROM IV AND LAB WORK THE PAST WEEK  06-17-2013  . Stool incontinence     "at times recently"   . Kidney stones 2014  . CAP (community acquired pneumonia)     admission 06-04-2013 secondary to failed oral antibitotics---  last cxr 06-16-2013  much improved  . SOB (shortness of breath) 06-17-2013 CURRENTLY RESIDUAL FROM RECENT CAP    SOB from obesity and deconditioning 2009- eval included CPX  . Arthritis     "spine" (07/28/2013)  . DDD (degenerative disc disease)   . CLL (chronic lymphocytic leukemia) dx'd 05/2013    "I don't think I have it though" (07/28/2013)  . Breast cancer 1997; 2014  right  . Ejection fraction    Past Surgical History  Procedure Laterality Date  . Lumbar epidural injection      has had 7 injections  . Total abdominal hysterectomy w/ bilateral salpingoophorectomy  1997  . Breast lumpectomy with axillary lymph node dissection Right 1997  . Transthoracic echocardiogram  05-11-2007  dr Ron Parker    normal lvf/  ef 65%  . Tonsillectomy  AGE 35  . Retinal detachment surgery Right 2013  . Cystoscopy with retrograde pyelogram, ureteroscopy and stent placement Bilateral 06/17/2013    Procedure: CYSTOSCOPY WITH RETROGRADE PYELOGRAM, URETEROSCOPY AND LEFT DOUBLE  J STENT PLACEMENT RIGHT URETERAL HOLMIIUM LASER AND DOUBLE J STENT ;  Surgeon: Hanley Ben, MD;  Location: [facility];  Service: Urology;   Laterality: Bilateral;  . Holmium laser application Bilateral 85/63/1497    Procedure: HOLMIUM LASER APPLICATION;  Surgeon: Hanley Ben, MD;  Location: [facility];  Service: Urology;  Laterality: Bilateral;  . Mastectomy w/ nodes partial Right 1997  . Mastectomy complete / simple Right 07/28/2013  . Appendectomy  1997  . Breast biopsy Right 2014  . Cystoscopy with ureteroscopy, stone basketry and stent placement Bilateral 2014  . Lithotripsy Left ~ 06/2013  . Simple mastectomy with axillary sentinel node biopsy Right 07/28/2013    Procedure: RIGHT TOTAL  MASTECTOMY;  Surgeon: Imogene Burn. Georgette Dover, MD;  Location: [facility];  Service: General;  Laterality: Right;  . Portacath placement Left 07/28/2013    Procedure: ATTEMPTED INSERTION PORT-A-CATH;  Surgeon: Imogene Burn. Georgette Dover, MD;  Location: [facility];  Service: General;  Laterality: Left;  . Port-a-cath removal Left 10/03/2014    Procedure: REMOVAL PORT-A-CATH;  Surgeon: Donnie Mesa, MD;  Location: [facility];  Service: General;  Laterality: Left;   Allergies  Allergen Reactions  . Amoxicillin Itching  . Lasix [Furosemide] Nausea And Vomiting and Other (See Comments)    headache  . Lithium Other (See Comments)    Dizziness, "cause me to fall"  . Sulfa Antibiotics Hives  . Trazodone And Nefazodone Other (See Comments)    insomnia  . Lyrica [Pregabalin] Diarrhea, Nausea Only and Rash   Prior to Admission medications   Medication Sig Start Date End Date Taking? Authorizing Provider  ALPRAZolam (XANAX) 0.5 MG tablet TAKE 1 TABLET EVERY 8 HOURS AS NEEDED Patient taking differently: TAKE 1 TABLET BY MOUTH EVERY 12 HOURS .   Yes Darlyne Russian, MD  amitriptyline (ELAVIL) 150 MG tablet Take 150 mg by mouth at bedtime.   Yes Historical Provider, MD  anastrozole (ARIMIDEX) 1 MG tablet TAKE 1 TABLET (1 MG TOTAL) BY MOUTH DAILY. 10/18/14  Yes Chauncey Cruel, MD  Ascorbic Acid (VITAMIN C) 1000 MG tablet Take 500 mg by mouth  daily.    Yes Historical Provider, MD  BIOTIN PO Take by mouth.   Yes Historical Provider, MD  Cholecalciferol (VITAMIN D-3) 1000 UNITS CAPS Take 5,000 Units by mouth daily.   Yes Historical Provider, MD  diphenhydrAMINE (BENADRYL) 25 MG tablet Take 25 mg by mouth every 6 (six) hours as needed for allergies.   Yes Historical Provider, MD  HYDROcodone-acetaminophen (NORCO/VICODIN) 5-325 MG per tablet Take 1 tablet by mouth every 4 (four) hours as needed. 10/03/14  Yes Donnie Mesa, MD  ibuprofen (ADVIL,MOTRIN) 200 MG tablet Take 800 mg by mouth every 6 (six) hours as needed for moderate pain.   Yes Historical Provider, MD  levothyroxine (SYNTHROID, LEVOTHROID) 50 MCG tablet Take 1 tablet (50 mcg  total) by mouth daily before breakfast. 04/20/14  Yes Lyman Speller, MD  metoprolol succinate (TOPROL-XL) 25 MG 24 hr tablet TAKE 1 TABLET (25 MG TOTAL) BY MOUTH DAILY. 08/09/14  Yes Carlena Bjornstad, MD  minoxidil (ROGAINE) 2 % external solution Apply 1 application topically 2 (two) times daily as needed.    Yes Historical Provider, MD  Multiple Vitamins-Minerals (HAIR VITAMINS PO) Take 1 tablet by mouth 2 (two) times daily.   Yes Historical Provider, MD  Omega-3 Fatty Acids (FISH OIL PO) Take 1 capsule by mouth daily.    Yes Historical Provider, MD  omeprazole (PRILOSEC) 40 MG capsule TAKE 1 CAPSULE (40 MG TOTAL) BY MOUTH EVERY MORNING. 08/22/14  Yes Chauncey Cruel, MD  Oyster Shell (OYSTER CALCIUM) 500 MG TABS tablet Take 500 mg of elemental calcium by mouth 2 (two) times daily.   Yes Historical Provider, MD  UNABLE TO FIND Take 0.05 mg by mouth at bedtime. Rezulti Immune Complex w/ Zinc   Yes Historical Provider, MD  Vilazodone HCl (VIIBRYD) 20 MG TABS Take 1 tablet by mouth 2 (two) times daily.    Yes Historical Provider, MD  buPROPion (WELLBUTRIN SR) 150 MG 12 hr tablet Take 150-300 mg by mouth 2 (two) times daily. Take 300mg  in the morning and 150mg  at 2pm    Historical Provider, MD   History    Social History  . Marital Status: Married    Spouse Name: N/A  . Number of Children: 1  . Years of Education: N/A   Occupational History  . homemaker    Social History Main Topics  . Smoking status: Never Smoker   . Smokeless tobacco: Never Used  . Alcohol Use: No     Comment: 07/28/2013 "no alcohol since 1994; never had problem w/it"  . Drug Use: No  . Sexual Activity: No     Comment: TAH/BSO   Other Topics Concern  . Not on file   Social History Narrative       Review of Systems  Constitutional: Negative for fever.  HENT: Positive for congestion and sore throat.   Respiratory: Positive for cough.   Gastrointestinal: Positive for diarrhea.       Objective:   Physical Exam  Constitutional: She is oriented to person, place, and time. She appears well-developed and well-nourished. No distress.  HENT:  Head: Normocephalic and atraumatic.  Eyes: Conjunctivae and EOM are normal.  Neck: Neck supple. No tracheal deviation present.  Cardiovascular: Normal rate and regular rhythm.   Pulmonary/Chest: Effort normal and breath sounds normal. No respiratory distress.  Musculoskeletal: Normal range of motion.  Lymphadenopathy:    She has no cervical adenopathy.  Neurological: She is alert and oriented to person, place, and time.  Skin: Skin is warm and dry.  Psychiatric: She has a normal mood and affect. Her behavior is normal.  Nursing note and vitals reviewed.    Filed Vitals:   10/29/14 1504  BP: 120/70  Pulse: 95  Temp: 97.8 F (36.6 C)  TempSrc: Oral  Resp: 20  Height: 5' 4.25" (1.632 m)  Weight: 197 lb 4 oz (89.472 kg)  SpO2: 98%        Assessment & Plan:   [name] [name] is a 69 y.o. female Cough - Plan: azithromycin (ZITHROMAX) 250 MG tablet  Acute upper respiratory infection  URI with early bronchitis. Sx [name] with saline ns, and mucinex. zpak if not improving into tomorrow. rtc precautions.   Meds ordered this encounter  Medications  .  azithromycin (ZITHROMAX) 250 MG tablet    Sig: Take 2 pills by mouth on day 1, then 1 pill by mouth per day on days 2 through 5.    Dispense:  6 tablet    Refill:  0   Patient Instructions  Saline nasal spray atleast 4 times per day, over the counter mucinex or mucinex DM if needed for cough.  If cough not improving tomorrow - can start Zpak.  Return to the clinic or go to the nearest emergency room if any of your symptoms worsen or new symptoms occur.  Cough, Adult  A cough is a reflex that helps clear your throat and airways. It can help heal the body or may be a reaction to an irritated airway. A cough may only last 2 or 3 weeks (acute) or may last more than 8 weeks (chronic).  CAUSES Acute cough:  Viral or bacterial infections. Chronic cough:  Infections.  Allergies.  Asthma.  Post-nasal drip.  Smoking.  Heartburn or acid reflux.  Some medicines.  Chronic lung problems (COPD).  Cancer. SYMPTOMS   Cough.  Fever.  Chest pain.  Increased breathing rate.  High-pitched whistling sound when breathing (wheezing).  Colored mucus that you cough up (sputum). TREATMENT   A bacterial cough may be treated with antibiotic medicine.  A viral cough must run its course and will not respond to antibiotics.  Your caregiver may recommend other treatments if you have a chronic cough. HOME [name] INSTRUCTIONS   Only take over-the-counter or prescription medicines for pain, discomfort, or fever as directed by your caregiver. Use cough suppressants only as directed by your caregiver.  Use a cold steam vaporizer or humidifier in your bedroom or home to help loosen secretions.  Sleep in a semi-upright position if your cough is worse at night.  Rest as needed.  Stop smoking if you smoke. SEEK IMMEDIATE MEDICAL [name] IF:   You have pus in your sputum.  Your cough starts to worsen.  You cannot control your cough with suppressants and are losing sleep.  You begin coughing  up blood.  You have difficulty breathing.  You develop pain which is getting worse or is uncontrolled with medicine.  You have a fever. MAKE SURE YOU:   Understand these instructions.  Will watch your condition.  Will get help right away if you are not doing well or get worse. Document Released: 02/07/2011 Document Revised: 11/03/2011 Document Reviewed: 02/07/2011 [facility] Patient Information 2015 [place], [place]. This information is not intended to replace advice given to you by your health [name] provider. Make sure you discuss any questions you have with your health [name] provider.  Upper Respiratory Infection, Adult An upper respiratory infection (URI) is also sometimes known as the common cold. The upper respiratory tract includes the nose, sinuses, throat, trachea, and bronchi. Bronchi are the airways leading to the lungs. Most people improve within 1 week, but symptoms can last up to 2 weeks. A residual cough may last even longer.  CAUSES Many different viruses can infect the tissues lining the upper respiratory tract. The tissues become irritated and inflamed and often become very moist. Mucus production is also common. A cold is contagious. You can easily spread the virus to others by oral contact. This includes kissing, sharing a glass, coughing, or sneezing. Touching your mouth or nose and then touching a surface, which is then touched by another person, can also spread the virus. SYMPTOMS  Symptoms typically develop 1 to  3 days after you come in contact with a cold virus. Symptoms vary from person to person. They may include:  Runny nose.  Sneezing.  Nasal congestion.  Sinus irritation.  Sore throat.  Loss of voice (laryngitis).  Cough.  Fatigue.  Muscle aches.  Loss of appetite.  Headache.  Low-grade fever. DIAGNOSIS  You might diagnose your own cold based on familiar symptoms, since most people get a cold 2 to 3 times a year. Your caregiver can confirm this  based on your exam. Most importantly, your caregiver can check that your symptoms are not due to another disease such as strep throat, sinusitis, pneumonia, asthma, or epiglottitis. Blood tests, throat tests, and X-rays are not necessary to diagnose a common cold, but they may sometimes be helpful in excluding other more serious diseases. Your caregiver will decide if any further tests are required. RISKS AND COMPLICATIONS  You may be at risk for a more severe case of the common cold if you smoke cigarettes, have chronic heart disease (such as heart failure) or lung disease (such as asthma), or if you have a weakened immune system. The very young and very old are also at risk for more serious infections. Bacterial sinusitis, middle ear infections, and bacterial pneumonia can complicate the common cold. The common cold can worsen asthma and chronic obstructive pulmonary disease (COPD). Sometimes, these complications can require emergency medical [name] and may be life-threatening. PREVENTION  The best way to protect against getting a cold is to practice good hygiene. Avoid oral or hand contact with people with cold symptoms. Wash your hands often if contact occurs. There is no clear evidence that vitamin C, vitamin E, echinacea, or exercise reduces the chance of developing a cold. However, it is always recommended to get plenty of rest and practice good nutrition. TREATMENT  Treatment is directed at relieving symptoms. There is no cure. Antibiotics are not effective, because the infection is caused by a virus, not by bacteria. Treatment may include:  Increased fluid intake. Sports drinks offer valuable electrolytes, sugars, and fluids.  Breathing heated mist or steam (vaporizer or shower).  Eating chicken soup or other clear broths, and maintaining good nutrition.  Getting plenty of rest.  Using gargles or lozenges for comfort.  Controlling fevers with ibuprofen or acetaminophen as directed by your  caregiver.  Increasing usage of your inhaler if you have asthma. Zinc gel and zinc lozenges, taken in the first 24 hours of the common cold, can shorten the duration and lessen the severity of symptoms. Pain medicines may help with fever, muscle aches, and throat pain. A variety of non-prescription medicines are available to treat congestion and runny nose. Your caregiver can make recommendations and may suggest nasal or lung inhalers for other symptoms.  HOME [name] INSTRUCTIONS   Only take over-the-counter or prescription medicines for pain, discomfort, or fever as directed by your caregiver.  Use a warm mist humidifier or inhale steam from a shower to increase air moisture. This may keep secretions moist and make it easier to breathe.  Drink enough water and fluids to keep your urine clear or pale yellow.  Rest as needed.  Return to work when your temperature has returned to normal or as your caregiver advises. You may need to stay home longer to avoid infecting others. You can also use a face mask and careful hand washing to prevent spread of the virus. SEEK MEDICAL [name] IF:   After the first few days, you feel you are getting  worse rather than better.  You need your caregiver's advice about medicines to control symptoms.  You develop chills, worsening shortness of breath, or brown or red sputum. These may be signs of pneumonia.  You develop yellow or brown nasal discharge or pain in the face, especially when you bend forward. These may be signs of sinusitis.  You develop a fever, swollen neck glands, pain with swallowing, or white areas in the back of your throat. These may be signs of strep throat. SEEK IMMEDIATE MEDICAL [name] IF:   You have a fever.  You develop severe or persistent headache, ear pain, sinus pain, or chest pain.  You develop wheezing, a prolonged cough, cough up blood, or have a change in your usual mucus (if you have chronic lung disease).  You develop sore  muscles or a stiff neck. Document Released: 02/04/2001 Document Revised: 11/03/2011 Document Reviewed: 11/16/2013 [facility] Patient Information 2015 [place], [place]. This information is not intended to replace advice given to you by your health [name] provider. Make sure you discuss any questions you have with your health [name] provider.     I personally performed the services described in this documentation, which was scribed in my presence. The recorded information has been reviewed and considered, and addended by me as needed.

## 2014-10-30 ENCOUNTER — Encounter: Payer: Self-pay | Admitting: Family Medicine

## 2014-11-03 ENCOUNTER — Ambulatory Visit (AMBULATORY_SURGERY_CENTER): Payer: Medicare Other | Admitting: Gastroenterology

## 2014-11-03 ENCOUNTER — Encounter: Payer: Self-pay | Admitting: Gastroenterology

## 2014-11-03 VITALS — BP 127/72 | HR 85 | Temp 99.1°F | Resp 21 | Ht 65.0 in | Wt 194.0 lb

## 2014-11-03 DIAGNOSIS — K222 Esophageal obstruction: Secondary | ICD-10-CM

## 2014-11-03 DIAGNOSIS — K599 Functional intestinal disorder, unspecified: Secondary | ICD-10-CM | POA: Diagnosis not present

## 2014-11-03 DIAGNOSIS — R1310 Dysphagia, unspecified: Secondary | ICD-10-CM

## 2014-11-03 DIAGNOSIS — Q394 Esophageal web: Secondary | ICD-10-CM

## 2014-11-03 DIAGNOSIS — R197 Diarrhea, unspecified: Secondary | ICD-10-CM

## 2014-11-03 DIAGNOSIS — R1319 Other dysphagia: Secondary | ICD-10-CM

## 2014-11-03 DIAGNOSIS — R1314 Dysphagia, pharyngoesophageal phase: Secondary | ICD-10-CM

## 2014-11-03 MED ORDER — SODIUM CHLORIDE 0.9 % IV SOLN: 500.0000 mL | INTRAVENOUS | Status: DC

## 2014-11-03 NOTE — Patient Instructions (Addendum)
YOU HAD AN ENDOSCOPIC PROCEDURE TODAY AT [facility]:   Refer to the procedure report that was given to you for any specific questions about what was found during the examination.  If the procedure report does not answer your questions, please call your gastroenterologist to clarify.  If you requested that your care partner not be given the details of your procedure findings, then the procedure report has been included in a sealed envelope for you to review at your convenience later.  YOU SHOULD EXPECT: Some feelings of bloating in the abdomen. Passage of more gas than usual.  Walking can help get rid of the air that was put into your GI tract during the procedure and reduce the bloating. If you had a lower endoscopy (such as a colonoscopy or flexible sigmoidoscopy) you may notice spotting of blood in your stool or on the toilet paper. If you underwent a bowel prep for your procedure, you may not have a normal bowel movement for a few days.  Please Note:  You might notice some irritation and congestion in your nose or some drainage.  This is from the oxygen used during your procedure.  There is no need for concern and it should clear up in a day or so.  SYMPTOMS TO REPORT IMMEDIATELY:   Following lower endoscopy (colonoscopy or flexible sigmoidoscopy):  Excessive amounts of blood in the stool  Significant tenderness or worsening of abdominal pains  Swelling of the abdomen that is new, acute  Fever of 100F or higher   Following upper endoscopy (EGD)  Vomiting of blood or coffee ground material  New chest pain or pain under the shoulder blades  Painful or persistently difficult swallowing  New shortness of breath  Fever of 100F or higher  Black, tarry-looking stools  For urgent or emergent issues, a gastroenterologist can be reached at any hour by calling [phone].   DIET: Clear liquids until 3:30 and then only a soft diet for the rest of the evening. You should  avoid alcoholic beverages for 24 hours. Be sure to chew your food thoroughly.  Keep taking you prilosec 20 mins before your meal twice per day.  ACTIVITY:  You should plan to take it easy for the rest of today and you should NOT DRIVE or use heavy machinery until tomorrow (because of the sedation medicines used during the test).    FOLLOW UP: Our staff will call the number listed on your records the next business day following your procedure to check on you and address any questions or concerns that you may have regarding the information given to you following your procedure. If we do not reach you, we will leave a message.  However, if you are feeling well and you are not experiencing any problems, there is no need to return our call.  We will assume that you have returned to your regular daily activities without incident.  If any biopsies were taken you will be contacted by phone or by letter within the next 1-3 weeks.  Please call us at [phone] if you have not heard about the biopsies in 3 weeks.    SIGNATURES/CONFIDENTIALITY: You and/or your care partner have signed paperwork which will be entered into your electronic medical record.  These signatures attest to the fact that that the information above on your After Visit Summary has been reviewed and is understood.  Full responsibility of the confidentiality of this discharge information lies with you and/or your care-partner.  Be sure to read all of the handouts given to you by your recovery room nurse.

## 2014-11-03 NOTE — Op Note (Signed)
[facility]  [address] [place], [address]   ENDOSCOPY PROCEDURE REPORT  PATIENT: [name] [name], [name] [name]  MR#: [id number] BIRTHDATE: June 13, 1946 , 21  yrs. old GENDER: female ENDOSCOPIST: Milus Banister, MD PROCEDURE DATE:  11/03/2014 PROCEDURE:  EGD w/ balloon dilation ASA CLASS:     Class III INDICATIONS:  GERD, intermittent dysphagia. MEDICATIONS: Monitored anesthesia care and Propofol 280 mg IV TOPICAL ANESTHETIC: none  DESCRIPTION OF PROCEDURE: After the risks benefits and alternatives of the procedure were thoroughly explained, informed consent was obtained.  The LB [id number] endoscope was introduced through the mouth and advanced to the second portion of the duodenum , Without limitations.  The instrument was slowly withdrawn as the mucosa was fully examined.    There was a 3cm hiatal hernia containing typical appearing Cameron's erosions.  There was a mucosal, Schatzki's ring at GE junction above the hiatal hernia which was dilated with CRE TTS balloon held inflated to 41mm for 1 minute.  The examination was otherwise normal.  Retroflexed views revealed no abnormalities.     The scope was then withdrawn from the patient and the procedure completed.  COMPLICATIONS: There were no immediate complications.  ENDOSCOPIC IMPRESSION: There was a 3cm hiatal hernia containing typical appearing Cameron's erosions.  There was a mucosal, Schatzki's ring at GE junction above the hiatal hernia which was dilated with CRE TTS balloon held inflated to 67mm for 1 minute.  The examination was otherwise normal  RECOMMENDATIONS: Continue your antiacid medicine daily but it is best to take it 20-30 minutes prior to either breakfast or dinner meals. Chew your food well, eat slowly, take small bites.  eSigned:  Milus Banister, MD 11/03/2014 2:32 PM

## 2014-11-03 NOTE — Progress Notes (Signed)
May discharge in 20 mins if stable per Dr. Ardis Hughs.

## 2014-11-03 NOTE — Progress Notes (Signed)
To recovery, report to [facility],RN. VSS 

## 2014-11-03 NOTE — Op Note (Signed)
[facility]  [address]. [place], [address]   COLONOSCOPY PROCEDURE REPORT  PATIENT: [name] [name], [name] [name]  MR#: [id number] BIRTHDATE: 1946-03-18 , 69  yrs. old GENDER: female ENDOSCOPIST: Milus Banister, MD PROCEDURE DATE:  11/03/2014 PROCEDURE:   Colonoscopy with biopsy and Colonoscopy, diagnostic First Screening Colonoscopy - Avg.  risk and is 50 yrs.  old or older - No.  Prior Negative Screening - Now for repeat screening. N/A  History of Adenoma - Now for follow-up colonoscopy & has been > or = to 3 yrs.  N/A ASA CLASS:   Class II INDICATIONS:Clinically significant diarrhea of unexplained origin.  MEDICATIONS: Monitored anesthesia care and Propofol 220 mg IV  DESCRIPTION OF PROCEDURE:   After the risks benefits and alternatives of the procedure were thoroughly explained, informed consent was obtained.  The digital rectal exam revealed no abnormalities of the rectum.   The LB [id number]  endoscope was introduced through the anus and advanced to the terminal ileum which was intubated for a short distance. No adverse events experienced.   The quality of the prep was good.  The instrument was then slowly withdrawn as the colon was fully examined.   COLON FINDINGS: The terminal ileum was normal.  The colonic mucosa was normal.  The colon was randomly biopsied and sent to pathology. Retroflexed views revealed no abnormalities. The time to cecum = 7.4 Withdrawal time = 5.4   The scope was withdrawn and the procedure completed. COMPLICATIONS: There were no immediate complications.  ENDOSCOPIC IMPRESSION: The terminal ileum was normal.  The colonic mucosa was normal.  The colon was randomly biopsied and sent to pathology  RECOMMENDATIONS: 1.  Await final pathology. 2.  You should continue to follow colorectal cancer screening guidelines for "routine risk" patients with a repeat colonoscopy in 10 years.  eSigned:  Milus Banister, MD 11/03/2014 2:17  PM

## 2014-11-03 NOTE — Progress Notes (Signed)
Called to room to assist during endoscopic procedure.  Patient ID and intended procedure confirmed with present staff. Received instructions for my participation in the procedure from the performing physician.  

## 2014-11-06 ENCOUNTER — Telehealth: Payer: Self-pay | Admitting: *Deleted

## 2014-11-06 NOTE — Telephone Encounter (Signed)
Left message on f/u call 

## 2014-11-10 ENCOUNTER — Encounter: Payer: Self-pay | Admitting: Gastroenterology

## 2014-11-20 ENCOUNTER — Other Ambulatory Visit: Payer: Self-pay | Admitting: Oncology

## 2014-12-04 ENCOUNTER — Ambulatory Visit (INDEPENDENT_AMBULATORY_CARE_PROVIDER_SITE_OTHER): Payer: Medicare Other | Admitting: Emergency Medicine

## 2014-12-04 ENCOUNTER — Ambulatory Visit
Admission: RE | Admit: 2014-12-04 | Discharge: 2014-12-04 | Disposition: A | Discharge status: Home / Self Care | Payer: Medicare Other | Source: Ambulatory Visit | Attending: Emergency Medicine | Admitting: Emergency Medicine

## 2014-12-04 VITALS — BP 128/74 | HR 101 | Temp 98.0°F | Resp 16 | Ht 64.25 in | Wt 199.3 lb

## 2014-12-04 DIAGNOSIS — R109 Unspecified abdominal pain: Secondary | ICD-10-CM

## 2014-12-04 LAB — POCT URINALYSIS DIPSTICK
Bilirubin, UA: NEGATIVE
Glucose, UA: NEGATIVE
Nitrite, UA: NEGATIVE
Spec Grav, UA: 1.025
UROBILINOGEN UA: 0.2
pH, UA: 5.5

## 2014-12-04 LAB — POCT CBC
Granulocyte percent: 31.1 %G — AB (ref 37–80)
HCT, POC: 37 % — AB (ref 37.7–47.9)
HEMOGLOBIN: 11.7 g/dL — AB (ref 12.2–16.2)
Lymph, poc: 11.8 — AB (ref 0.6–3.4)
MCH, POC: 26.5 pg — AB (ref 27–31.2)
MCHC: 31.6 g/dL — AB (ref 31.8–35.4)
MCV: 84.1 fL (ref 80–97)
MID (cbc): 0.7 (ref 0–0.9)
MPV: 7.2 fL (ref 0–99.8)
POC Granulocyte: 5.7 (ref 2–6.9)
POC LYMPH PERCENT: 64.8 %L — AB (ref 10–50)
POC MID %: 4.1 % (ref 0–12)
Platelet Count, POC: 246 10*3/uL (ref 142–424)
RBC: 4.4 M/uL (ref 4.04–5.48)
RDW, POC: 16.7 %
WBC: 18.2 10*3/uL — AB (ref 4.6–10.2)

## 2014-12-04 LAB — COMPREHENSIVE METABOLIC PANEL
ALBUMIN: 4.4 g/dL (ref 3.5–5.2)
ALK PHOS: 81 U/L (ref 39–117)
ALT: 42 U/L — AB (ref 0–35)
AST: 27 U/L (ref 0–37)
BUN: 16 mg/dL (ref 6–23)
CALCIUM: 9.5 mg/dL (ref 8.4–10.5)
CO2: 25 mEq/L (ref 19–32)
Chloride: 106 mEq/L (ref 96–112)
Creat: 1.12 mg/dL — ABNORMAL HIGH (ref 0.50–1.10)
Glucose, Bld: 109 mg/dL — ABNORMAL HIGH (ref 70–99)
POTASSIUM: 4.3 meq/L (ref 3.5–5.3)
SODIUM: 141 meq/L (ref 135–145)
TOTAL PROTEIN: 6.8 g/dL (ref 6.0–8.3)
Total Bilirubin: 0.3 mg/dL (ref 0.2–1.2)

## 2014-12-04 LAB — LIPASE: Lipase: 26 U/L (ref 0–75)

## 2014-12-04 LAB — POCT UA - MICROSCOPIC ONLY
CRYSTALS, UR, HPF, POC: NEGATIVE
Casts, Ur, LPF, POC: NEGATIVE
Mucus, UA: NEGATIVE
YEAST UA: NEGATIVE

## 2014-12-04 LAB — AMYLASE: AMYLASE: 39 U/L (ref 0–105)

## 2014-12-04 NOTE — Patient Instructions (Signed)

## 2014-12-04 NOTE — Progress Notes (Signed)
[facility] [address], [place] [address] [phone]  Date:  12/04/2014   Name:  [name] [name]   DOB:  16-Jan-1946   MRN:  [id number]  PCP:  [name], MD    Chief Complaint: Abdominal Pain and Flank Pain   History of Present Illness:  [name] [name] is a 69 y.o. very pleasant female patient who presents with the following:  Pain for days in right upper quadrant Says it started in RLQ and has migrated up. Poor appetite. No nausea or vomiting.  No stool change No dysuria, urgency or frequency.  No hematuria No food intolerance. No cough or coryza. History of stones, TAH BSO Pain increases with moving and twisting. No improvement with over the counter medications or other home remedies. Denies other complaint or health concern today.   Patient Active Problem List   Diagnosis Date Noted  . Ejection fraction   . Anemia, unspecified 10/25/2013  . Diarrhea 10/04/2013  . GERD (gastroesophageal reflux disease) 09/27/2013  . Seroma complicating a procedure 08/12/2013  . CLL (chronic lymphocytic leukemia) 07/27/2013  . Breast cancer of lower-outer quadrant of right female breast 07/26/2013  . Nephrolithiasis 06/16/2013  . 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 Chronic pain syndrome 05/10/2013  . H/O long-term treatment with high-risk medication 05/10/2013  . DDD (degenerative disc disease) 05/10/2013  . SOB (shortness of breath)   . Sinus tachycardia   . HLD (hyperlipidemia)   . Orthostatic hypotension   . Anxiety   . Arthritis   . Diverticulosis   . Fibromyalgia   . Hypothyroidism     Past Medical History  Diagnosis Date  . Sinus tachycardia     mild resting  . HLD (hyperlipidemia)   . Orthostatic hypotension     slight in the past  . Anxiety   . Depression   . Diverticulosis   . Fibromyalgia 1978  . Hypothyroidism   . Alopecia   . Ruptured disk     one in neck and two in back  . Plantar fasciitis   . GERD (gastroesophageal  reflux disease)   . History of syncope     episode 2008--  no recurrence since  . Ureteral calculi     bilateral  . Leukocytosis   . Chronic back pain     "lower back and upper neck" (07/28/2013)  . 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 ear   . Urgency of urination   . Hematuria   . Sensation of pressure in bladder area   . Wears glasses   . Ecchymosis     FROM IV AND LAB WORK THE PAST WEEK  06-17-2013  . Stool incontinence     "at times recently"   . Kidney stones 2014  . CAP (community acquired pneumonia)     admission 06-04-2013 secondary to failed oral antibitotics---  last cxr 06-16-2013  much improved  . SOB (shortness of breath) 06-17-2013 CURRENTLY RESIDUAL FROM RECENT CAP    SOB from obesity and deconditioning 2009- eval included CPX  . Arthritis     "spine" (07/28/2013)  . DDD (degenerative disc disease)   . CLL (chronic lymphocytic leukemia) dx'd 05/2013    "I don't think I have it though" (07/28/2013)  . Breast cancer 1997; 2014    right  . Ejection fraction     Past Surgical History  Procedure Laterality Date  . Lumbar epidural injection      has had 7 injections  . Total  abdominal hysterectomy w/ bilateral salpingoophorectomy  1997  . Breast lumpectomy with axillary lymph node dissection Right 1997  . Transthoracic echocardiogram  05-11-2007  dr Ron Parker    normal lvf/  ef 65%  . Tonsillectomy  AGE 63  . Retinal detachment surgery Right 2013  . Cystoscopy with retrograde pyelogram, ureteroscopy and stent placement Bilateral 06/17/2013    Procedure: CYSTOSCOPY WITH RETROGRADE PYELOGRAM, URETEROSCOPY AND LEFT DOUBLE  J STENT PLACEMENT RIGHT URETERAL HOLMIIUM LASER AND DOUBLE J STENT ;  Surgeon: Hanley Ben, MD;  Location: [facility];  Service: Urology;  Laterality: Bilateral;  . Holmium laser application Bilateral 56/43/3295    Procedure: HOLMIUM LASER APPLICATION;  Surgeon: Hanley Ben, MD;  Location: [facility];  Service: Urology;  Laterality:  Bilateral;  . Mastectomy w/ nodes partial Right 1997  . Mastectomy complete / simple Right 07/28/2013  . Appendectomy  1997  . Breast biopsy Right 2014  . Cystoscopy with ureteroscopy, stone basketry and stent placement Bilateral 2014  . Lithotripsy Left ~ 06/2013  . Simple mastectomy with axillary sentinel node biopsy Right 07/28/2013    Procedure: RIGHT TOTAL  MASTECTOMY;  Surgeon: Imogene Burn. Georgette Dover, MD;  Location: [facility];  Service: General;  Laterality: Right;  . Portacath placement Left 07/28/2013    Procedure: ATTEMPTED INSERTION PORT-A-CATH;  Surgeon: Imogene Burn. Georgette Dover, MD;  Location: [facility];  Service: General;  Laterality: Left;  . Port-a-cath removal Left 10/03/2014    Procedure: REMOVAL PORT-A-CATH;  Surgeon: Donnie Mesa, MD;  Location: [facility];  Service: General;  Laterality: Left;    History  Substance Use Topics  . Smoking status: Never Smoker   . Smokeless tobacco: Never Used  . Alcohol Use: No     Comment: 07/28/2013 "no alcohol since 1994; never had problem w/it"    Family History  Problem Relation Age of Onset  . Heart disease Maternal Grandfather   . Diabetes Maternal Grandfather   . Colon cancer Maternal Aunt 79  . Brain cancer Paternal Grandmother     dx in 27s  . Dementia Mother   . Diabetes Mother   . Osteoporosis Mother   . Diabetes Maternal Aunt   . Prostate cancer Father 61  . Bipolar disorder Maternal Aunt   . Stroke Maternal Aunt   . Stomach cancer Paternal Uncle     dx in late 6s    Allergies  Allergen Reactions  . Amoxicillin Itching  . Lasix [Furosemide] Nausea And Vomiting and Other (See Comments)    headache  . Lithium Other (See Comments)    Dizziness, "cause me to fall"  . Sulfa Antibiotics Hives  . Trazodone And Nefazodone Other (See Comments)    insomnia  . Lyrica [Pregabalin] Diarrhea, Nausea Only and Rash    Medication list has been reviewed and updated.  Current Outpatient Prescriptions on File Prior to Visit   Medication Sig Dispense Refill  . ALPRAZolam (XANAX) 0.5 MG tablet TAKE 1 TABLET EVERY 8 HOURS AS NEEDED (Patient taking differently: TAKE 1 TABLET BY MOUTH EVERY 12 HOURS .) 30 tablet 0  . amitriptyline (ELAVIL) 150 MG tablet Take 150 mg by mouth at bedtime.    Marland Kitchen anastrozole (ARIMIDEX) 1 MG tablet TAKE 1 TABLET (1 MG TOTAL) BY MOUTH DAILY. 90 tablet 0  . Ascorbic Acid (VITAMIN C) 1000 MG tablet Take 500 mg by mouth daily.     Marland Kitchen BIOTIN PO Take by mouth.    . Cholecalciferol (VITAMIN D-3) 1000 UNITS  CAPS Take 5,000 Units by mouth daily.    . diphenhydrAMINE (BENADRYL) 25 MG tablet Take 25 mg by mouth every 6 (six) hours as needed for allergies.    Marland Kitchen gabapentin (NEURONTIN) 300 MG capsule   0  . ibuprofen (ADVIL,MOTRIN) 200 MG tablet Take 800 mg by mouth every 6 (six) hours as needed for moderate pain.    Marland Kitchen levothyroxine (SYNTHROID, LEVOTHROID) 50 MCG tablet Take 1 tablet (50 mcg total) by mouth daily before breakfast. 90 tablet 3  . metoprolol succinate (TOPROL-XL) 25 MG 24 hr tablet TAKE 1 TABLET (25 MG TOTAL) BY MOUTH DAILY. 30 tablet 5  . minoxidil (ROGAINE) 2 % external solution Apply 1 application topically 2 (two) times daily as needed.     . Multiple Vitamins-Minerals (HAIR VITAMINS PO) Take 1 tablet by mouth 2 (two) times daily.    . Omega-3 Fatty Acids (FISH OIL PO) Take 1 capsule by mouth daily.     Marland Kitchen omeprazole (PRILOSEC) 40 MG capsule TAKE ONE CAPSULE BY MOUTH EVERY MORNING 90 capsule 0  . Oyster Shell (OYSTER CALCIUM) 500 MG TABS tablet Take 500 mg of elemental calcium by mouth 2 (two) times daily.    Marland Kitchen REXULTI 0.5 MG TABS   0  . UNABLE TO FIND Take 0.05 mg by mouth at bedtime. Rezulti Immune Complex w/ Zinc    . Vilazodone HCl (VIIBRYD) 20 MG TABS Take 1 tablet by mouth 2 (two) times daily.     Marland Kitchen buPROPion (WELLBUTRIN SR) 150 MG 12 hr tablet Take 150-300 mg by mouth 2 (two) times daily. Take 300mg  in the morning and 150mg  at 2pm    . HYDROcodone-acetaminophen (NORCO/VICODIN) 5-325  MG per tablet Take 1 tablet by mouth every 4 (four) hours as needed. (Patient not taking: Reported on 12/04/2014) 40 tablet 0   No current facility-administered medications on file prior to visit.    Review of Systems:  As per HPI, otherwise negative.    Physical Examination: There were no vitals filed for this visit. There were no vitals filed for this visit. There is no weight on file to calculate BMI. Ideal Body Weight:    GEN: WDWN, NAD, Non-toxic, A & O x 3 HEENT: Atraumatic, Normocephalic. Neck supple. No masses, No LAD. Ears and Nose: No external deformity. CV: RRR, No M/G/R. No JVD. No thrill. No extra heart sounds. PULM: CTA B, no wheezes, crackles, rhonchi. No retractions. No resp. distress. No accessory muscle use. ABD: S, right upper quadrant tender with positive murphy sign, ND, +BS. No rebound. No HSM. EXTR: No c/c/e NEURO Normal gait.  PSYCH: Normally interactive. Conversant. Not depressed or anxious appearing.  Calm demeanor.    Assessment and Plan: RUQ pain Labs pending sono GB  Signed,  Ellison Carwin, MD   Results for orders placed or performed in visit on 12/04/14  POCT urinalysis dipstick  Result Value Ref Range   Color, UA yellow    Clarity, UA clear    Glucose, UA neg    Bilirubin, UA neg    Ketones, UA trace    Spec Grav, UA 1.025    Blood, UA trace-intact    pH, UA 5.5    Protein, UA trace    Urobilinogen, UA 0.2    Nitrite, UA neg    Leukocytes, UA Trace   POCT UA - Microscopic Only  Result Value Ref Range   WBC, Ur, HPF, POC 0-3    RBC, urine, microscopic 0-1    Bacteria, U  Microscopic 2+    Mucus, UA neg    Epithelial cells, urine per micros 2-4    Crystals, Ur, HPF, POC neg    Casts, Ur, LPF, POC neg    Yeast, UA neg

## 2014-12-05 ENCOUNTER — Encounter: Payer: Self-pay | Admitting: Family Medicine

## 2014-12-06 ENCOUNTER — Telehealth: Payer: Self-pay | Admitting: *Deleted

## 2014-12-06 NOTE — Telephone Encounter (Signed)
Pt was notified of elevated WBC and other lab results.  However, pt is still having pain and wants to be rechecked. I advised pt to come in to be seen.

## 2014-12-07 ENCOUNTER — Ambulatory Visit (INDEPENDENT_AMBULATORY_CARE_PROVIDER_SITE_OTHER): Payer: Medicare Other | Admitting: Family Medicine

## 2014-12-07 VITALS — BP 120/79 | HR 109 | Temp 97.0°F | Resp 18 | Ht 64.25 in | Wt 199.8 lb

## 2014-12-07 DIAGNOSIS — R1011 Right upper quadrant pain: Secondary | ICD-10-CM | POA: Diagnosis not present

## 2014-12-07 DIAGNOSIS — E860 Dehydration: Secondary | ICD-10-CM | POA: Diagnosis not present

## 2014-12-07 DIAGNOSIS — I951 Orthostatic hypotension: Secondary | ICD-10-CM

## 2014-12-07 DIAGNOSIS — E039 Hypothyroidism, unspecified: Secondary | ICD-10-CM | POA: Diagnosis not present

## 2014-12-07 DIAGNOSIS — R000 Tachycardia, unspecified: Secondary | ICD-10-CM

## 2014-12-07 LAB — POCT CBC
GRANULOCYTE PERCENT: 25.4 % — AB (ref 37–80)
HEMATOCRIT: 36.2 % — AB (ref 37.7–47.9)
Hemoglobin: 11.6 g/dL — AB (ref 12.2–16.2)
Lymph, poc: 14.6 — AB (ref 0.6–3.4)
MCH, POC: 26.7 pg — AB (ref 27–31.2)
MCHC: 32.1 g/dL (ref 31.8–35.4)
MCV: 83.1 fL (ref 80–97)
MID (CBC): 1.4 — AB (ref 0–0.9)
MPV: 7.5 fL (ref 0–99.8)
PLATELET COUNT, POC: 252 10*3/uL (ref 142–424)
POC Granulocyte: 5.5 (ref 2–6.9)
POC LYMPH %: 68 % — AB (ref 10–50)
POC MID %: 6.6 %M (ref 0–12)
RBC: 4.35 M/uL (ref 4.04–5.48)
RDW, POC: 15.7 %
WBC: 21.5 10*3/uL — AB (ref 4.6–10.2)

## 2014-12-07 LAB — POCT UA - MICROSCOPIC ONLY
Casts, Ur, LPF, POC: NEGATIVE
Crystals, Ur, HPF, POC: NEGATIVE
Mucus, UA: NEGATIVE
YEAST UA: NEGATIVE

## 2014-12-07 LAB — POCT URINALYSIS DIPSTICK
Bilirubin, UA: NEGATIVE
Glucose, UA: NEGATIVE
KETONES UA: NEGATIVE
Nitrite, UA: NEGATIVE
PH UA: 5.5
SPEC GRAV UA: 1.025
Urobilinogen, UA: 0.2

## 2014-12-07 NOTE — Patient Instructions (Addendum)
Push fluids - try low fat, high protein diet - and make sure you are drinking at least 64 ounces of water a day.  Try to pay attention to what makes your pain better or worse.  Lets plan to recheck you on Sunday to ensure your pain has resolved and that your blood pressure and heart rate have normalized.  If you continue to have pain, please call so we can get an abdominal/pelvic CT or a HIDA scan to check on the function of your gallbladder.  Dehydration, Adult Dehydration is when you lose more fluids from the body than you take in. Vital organs like the kidneys, brain, and heart cannot function without a proper amount of fluids and salt. Any loss of fluids from the body can cause dehydration.  CAUSES   Vomiting.  Diarrhea.  Excessive sweating.  Excessive urine output.  Fever. SYMPTOMS  Mild dehydration  Thirst.  Dry lips.  Slightly dry mouth. Moderate dehydration  Very dry mouth.  Sunken eyes.  Skin does not bounce back quickly when lightly pinched and released.  Dark urine and decreased urine production.  Decreased tear production.  Headache. Severe dehydration  Very dry mouth.  Extreme thirst.  Rapid, weak pulse (more than 100 beats per minute at rest).  Cold hands and feet.  Not able to sweat in spite of heat and temperature.  Rapid breathing.  Blue lips.  Confusion and lethargy.  Difficulty being awakened.  Minimal urine production.  No tears. DIAGNOSIS  Your caregiver will diagnose dehydration based on your symptoms and your exam. Blood and urine tests will help confirm the diagnosis. The diagnostic evaluation should also identify the cause of dehydration. TREATMENT  Treatment of mild or moderate dehydration can often be done at home by increasing the amount of fluids that you drink. It is best to drink small amounts of fluid more often. Drinking too much at one time can make vomiting worse. Refer to the home care instructions below. Severe  dehydration needs to be treated at the hospital where you will probably be given intravenous (IV) fluids that contain water and electrolytes. HOME CARE INSTRUCTIONS   Ask your caregiver about specific rehydration instructions.  Drink enough fluids to keep your urine clear or pale yellow.  Drink small amounts frequently if you have nausea and vomiting.  Eat as you normally do.  Avoid:  Foods or drinks high in sugar.  Carbonated drinks.  Juice.  Extremely hot or cold fluids.  Drinks with caffeine.  Fatty, greasy foods.  Alcohol.  Tobacco.  Overeating.  Gelatin desserts.  Wash your hands well to avoid spreading bacteria and viruses.  Only take over-the-counter or prescription medicines for pain, discomfort, or fever as directed by your caregiver.  Ask your caregiver if you should continue all prescribed and over-the-counter medicines.  Keep all follow-up appointments with your caregiver. SEEK MEDICAL CARE IF:  You have abdominal pain and it increases or stays in one area (localizes).  You have a rash, stiff neck, or severe headache.  You are irritable, sleepy, or difficult to awaken.  You are weak, dizzy, or extremely thirsty. SEEK IMMEDIATE MEDICAL CARE IF:   You are unable to keep fluids down or you get worse despite treatment.  You have frequent episodes of vomiting or diarrhea.  You have blood or green matter (bile) in your vomit.  You have blood in your stool or your stool looks black and tarry.  You have not urinated in 6 to 8 hours, or  you have only urinated a small amount of very dark urine.  You have a fever.  You faint. MAKE SURE YOU:   Understand these instructions.  Will watch your condition.  Will get help right away if you are not doing well or get worse. Document Released: 08/11/2005 Document Revised: 11/03/2011 Document Reviewed: 03/31/2011 [employer] Patient Information 2015 [place], [place]. This information is not intended to  replace advice given to you by your health care provider. Make sure you discuss any questions you have with your health care provider. Orthostatic Hypotension Orthostatic hypotension is a sudden drop in blood pressure. It happens when you quickly stand up from a seated or lying position. You may feel dizzy or light-headed. This can last for just a few seconds or for up to a few minutes. It is usually not a serious problem. However, if this happens frequently or gets worse, it can be a sign of something more serious. CAUSES  Different things can cause orthostatic hypotension, including:   Loss of body fluids (dehydration).  Medicines that lower blood pressure.  Sudden changes in posture, such as standing up quickly after you have been sitting or lying down.  Taking too much of your medicine. SIGNS AND SYMPTOMS   Light-headedness or dizziness.   Fainting or near-fainting.   A fast heart rate.   Weakness.   Feeling tired (fatigue).  DIAGNOSIS  Your health care provider may do several things to help diagnose your condition and identify the cause. These may include:   Taking a medical history and doing a physical exam.  Checking your blood pressure. Your health care provider will check your blood pressure when you are:  Lying down.  Sitting.  Standing.  Using tilt table testing. In this test, you lie down on a table that moves from a lying position to a standing position. You will be strapped onto the table. This test monitors your blood pressure and heart rate when you are in different positions. TREATMENT  Treatment will vary depending on the cause. Possible treatments include:   Changing the dosage of your medicines.  Wearing compression stockings on your lower legs.  Standing up slowly after sitting or lying down.  Eating more salt.  Eating frequent, small meals.  In some cases, getting IV fluids.  Taking medicine to enhance fluid retention. HOME CARE  INSTRUCTIONS  Only take over-the-counter or prescription medicines as directed by your health care provider.  Follow your health care provider's instructions for changing the dosage of your current medicines.  Do not stop or adjust your medicine on your own.  Stand up slowly after sitting or lying down. This allows your body to adjust to the different position.  Wear compression stockings as directed.  Eat extra salt as directed.  Do not add extra salt to your diet unless directed to by your health care provider.  Eat frequent, small meals.  Avoid standing suddenly after eating.  Avoid hot showers or excessive heat as directed by your health care provider.  Keep all follow-up appointments. SEEK MEDICAL CARE IF:  You continue to feel dizzy or light-headed after standing.  You feel groggy or confused.  You feel cold, clammy, or sick to your stomach (nauseous).  You have blurred vision.  You feel short of breath. SEEK IMMEDIATE MEDICAL CARE IF:   You faint after standing.  You have chest pain.  You have difficulty breathing.   You lose feeling or movement in your arms or legs.   You have  slurred speech or difficulty talking, or you are unable to talk.  MAKE SURE YOU:   Understand these instructions.  Will watch your condition.  Will get help right away if you are not doing well or get worse. Document Released: 08/01/2002 Document Revised: 08/16/2013 Document Reviewed: 06/03/2013 [facility] Patient Information 2015 [place], [place]. This information is not intended to replace advice given to you by your health care provider. Make sure you discuss any questions you have with your health care provider.

## 2014-12-07 NOTE — Progress Notes (Addendum)
Subjective:    Patient ID: [name] [name], female    DOB: 1946/03/29, 69 y.o.   MRN: [id number] This chart was scribed for Delman Cheadle, MD by [name], Medical Scribe. This patient was seen in Room 5 and the patient's care was started at 2:52 PM.   Chief Complaint  Patient presents with  . Follow-up    gallbladder    HPI HPI Comments: [name] [name] is a 69 y.o. female with a hx of kidney stones, fibromyalgia, arthritis and DDD who presents to the [facility] for a follow-up for RUQ abdominal pain.  She was seen 3 days ago by Dr. Ouida Sills for RUQ pain with decreased appetite. Patient was sent for stat RUQ [place] which was normal. No gallstones, normal gall bladder in common bile duct. Does have changes consistent with fatty liver. She had a high leukocytosis, white count 18.2 with elevated lymphocytes. Creatinine and ALT were very minimally elevated with normal amylase and lipase. Urine was concentrated with some trace blood.   Abdominal Pain: She has had intermittent RUQ abdominal pain for about a week at this point; she still has some RUQ abdominal and she notes the area is sore to touch. The pain is not worse with fatty foods; she notes she does not eat a lot of fried or fatty foods. Last night, she had abdominal pain after sitting down for about 15-30 minutes; she had done some cleaning prior to onset. Patient also reports having "funny taste" in her mouth for the past several days; she has tried brushing her teeth and mouthwash but with no relief. She also has had 1 episode of nausea and decreased appetite; she has been eating less and eating poorly. Patient does report back pain, but she attributes this to her chronic conditions. She states she has been drinking amount of fluid and has been urinating a normal amount. Patient does report burping and passing gas, but unchanged from baseline. She does not normally drink soda or other carbonated drinks, but she does state that  she has been drinking more soda the past 3 weeks. Patient denies urine discoloration and urinary/bowel changes from baseline. Her last episode of kidney stones was in November, 2014; they were found in both of her kidneys. About 1 month ago, she had an endoscopy, colonoscopy, and esophagus stretching done by Dr. Ardis Hughs. She takes ibuprofen daily. She has been taking omeprazole for a long time for heartburn/indigestion.  Diarrhea: Patient has had issues with diarrhea/frequent bowel movements for the past year since undergoing chemotherapy. She had been told by Dr. Ardis Hughs to take imodium for this, which does help with her diarrhea. She has been taking imodium twice daily in the morning.   Foot Cramps: Patient has been having occasional cramps in her feet; she did have cramping pain in her feet this morning.  Past Medical History  Diagnosis Date  . Sinus tachycardia     mild resting  . HLD (hyperlipidemia)   . Orthostatic hypotension     slight in the past  . Anxiety   . Depression   . Diverticulosis   . Fibromyalgia 1978  . Hypothyroidism   . Alopecia   . Ruptured disk     one in neck and two in back  . Plantar fasciitis   . GERD (gastroesophageal reflux disease)   . History of syncope     episode 2008--  no recurrence since  . Ureteral calculi     bilateral  .  Leukocytosis   . Chronic back pain     "lower back and upper neck" (07/28/2013)  . 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 ear   . Urgency of urination   . Hematuria   . Sensation of pressure in bladder area   . Wears glasses   . Ecchymosis     FROM IV AND LAB WORK THE PAST WEEK  06-17-2013  . Stool incontinence     "at times recently"   . Kidney stones 2014  . CAP (community acquired pneumonia)     admission 06-04-2013 secondary to failed oral antibitotics---  last cxr 06-16-2013  much improved  . SOB (shortness of breath) 06-17-2013 CURRENTLY RESIDUAL FROM RECENT CAP    SOB from obesity and deconditioning 2009- eval included CPX  .  Arthritis     "spine" (07/28/2013)  . DDD (degenerative disc disease)   . CLL (chronic lymphocytic leukemia) dx'd 05/2013    "I don't think I have it though" (07/28/2013)  . Breast cancer 1997; 2014    right  . Ejection fraction    Past Surgical History  Procedure Laterality Date  . Lumbar epidural injection      has had 7 injections  . Total abdominal hysterectomy w/ bilateral salpingoophorectomy  1997  . Breast lumpectomy with axillary lymph node dissection Right 1997  . Transthoracic echocardiogram  05-11-2007  dr Ron Parker    normal lvf/  ef 65%  . Tonsillectomy  AGE 1  . Retinal detachment surgery Right 2013  . Cystoscopy with retrograde pyelogram, ureteroscopy and stent placement Bilateral 06/17/2013    Procedure: CYSTOSCOPY WITH RETROGRADE PYELOGRAM, URETEROSCOPY AND LEFT DOUBLE  J STENT PLACEMENT RIGHT URETERAL HOLMIIUM LASER AND DOUBLE J STENT ;  Surgeon: Hanley Ben, MD;  Location: [place];  Service: Urology;  Laterality: Bilateral;  . Holmium laser application Bilateral 95/18/8416    Procedure: HOLMIUM LASER APPLICATION;  Surgeon: Hanley Ben, MD;  Location: [place];  Service: Urology;  Laterality: Bilateral;  . Mastectomy w/ nodes partial Right 1997  . Mastectomy complete / simple Right 07/28/2013  . Appendectomy  1997  . Breast biopsy Right 2014  . Cystoscopy with ureteroscopy, stone basketry and stent placement Bilateral 2014  . Lithotripsy Left ~ 06/2013  . Simple mastectomy with axillary sentinel node biopsy Right 07/28/2013    Procedure: RIGHT TOTAL  MASTECTOMY;  Surgeon: Imogene Burn. Georgette Dover, MD;  Location: [place];  Service: General;  Laterality: Right;  . Portacath placement Left 07/28/2013    Procedure: ATTEMPTED INSERTION PORT-A-CATH;  Surgeon: Imogene Burn. Georgette Dover, MD;  Location: [place];  Service: General;  Laterality: Left;  . Port-a-cath removal Left 10/03/2014    Procedure: REMOVAL PORT-A-CATH;  Surgeon: Donnie Mesa, MD;  Location:  [place];  Service: General;  Laterality: Left;   Current Outpatient Prescriptions on File Prior to Visit  Medication Sig Dispense Refill  . ALPRAZolam (XANAX) 0.5 MG tablet TAKE 1 TABLET EVERY 8 HOURS AS NEEDED (Patient taking differently: TAKE 1 TABLET BY MOUTH EVERY 12 HOURS .) 30 tablet 0  . amitriptyline (ELAVIL) 150 MG tablet Take 150 mg by mouth at bedtime.    Marland Kitchen anastrozole (ARIMIDEX) 1 MG tablet TAKE 1 TABLET (1 MG TOTAL) BY MOUTH DAILY. 90 tablet 0  . Ascorbic Acid (VITAMIN C) 1000 MG tablet Take 500 mg by mouth daily.     Marland Kitchen BIOTIN PO Take by mouth.    . Cholecalciferol (VITAMIN D-3) 1000 UNITS CAPS Take 5,000 Units by mouth daily.    Marland Kitchen  diphenhydrAMINE (BENADRYL) 25 MG tablet Take 25 mg by mouth every 6 (six) hours as needed for allergies.    Marland Kitchen gabapentin (NEURONTIN) 300 MG capsule   0  . ibuprofen (ADVIL,MOTRIN) 200 MG tablet Take 800 mg by mouth every 6 (six) hours as needed for moderate pain.    Marland Kitchen levothyroxine (SYNTHROID, LEVOTHROID) 50 MCG tablet Take 1 tablet (50 mcg total) by mouth daily before breakfast. 90 tablet 3  . metoprolol succinate (TOPROL-XL) 25 MG 24 hr tablet TAKE 1 TABLET (25 MG TOTAL) BY MOUTH DAILY. 30 tablet 5  . minoxidil (ROGAINE) 2 % external solution Apply 1 application topically 2 (two) times daily as needed.     . Multiple Vitamins-Minerals (HAIR VITAMINS PO) Take 1 tablet by mouth 2 (two) times daily.    . Omega-3 Fatty Acids (FISH OIL PO) Take 1 capsule by mouth daily.     Marland Kitchen omeprazole (PRILOSEC) 40 MG capsule TAKE ONE CAPSULE BY MOUTH EVERY MORNING 90 capsule 0  . Oyster Shell (OYSTER CALCIUM) 500 MG TABS tablet Take 500 mg of elemental calcium by mouth 2 (two) times daily.    Marland Kitchen REXULTI 0.5 MG TABS   0  . UNABLE TO FIND Take 0.05 mg by mouth at bedtime. Rezulti Immune Complex w/ Zinc    . Vilazodone HCl (VIIBRYD) 20 MG TABS Take 1 tablet by mouth 2 (two) times daily.     Marland Kitchen buPROPion (WELLBUTRIN SR) 150 MG 12 hr tablet Take 150-300 mg by  mouth 2 (two) times daily. Take 300mg  in the morning and 150mg  at 2pm    . HYDROcodone-acetaminophen (NORCO/VICODIN) 5-325 MG per tablet Take 1 tablet by mouth every 4 (four) hours as needed. 40 tablet 0   No current facility-administered medications on file prior to visit.   Allergies  Allergen Reactions  . Lasix [Furosemide] Nausea And Vomiting and Other (See Comments)    headache  . Lithium Other (See Comments)    Dizziness, "cause me to fall"  . Sulfa Antibiotics Hives  . Trazodone And Nefazodone Other (See Comments)    insomnia  . Lyrica [Pregabalin] Diarrhea, Nausea Only and Rash   Family History  Problem Relation Age of Onset  . Heart disease Maternal Grandfather   . Diabetes Maternal Grandfather   . Colon cancer Maternal Aunt 79  . Brain cancer Paternal Grandmother     dx in 45s  . Dementia Mother   . Diabetes Mother   . Osteoporosis Mother   . Diabetes Maternal Aunt   . Prostate cancer Father 51  . Bipolar disorder Maternal Aunt   . Stroke Maternal Aunt   . Stomach cancer Paternal Uncle     dx in late 42s   History   Social History  . Marital Status: Married    Spouse Name: N/A  . Number of Children: 1  . Years of Education: N/A   Occupational History  . homemaker    Social History Main Topics  . Smoking status: Never Smoker   . Smokeless tobacco: Never Used  . Alcohol Use: No     Comment: 07/28/2013 "no alcohol since 1994; never had problem w/it"  . Drug Use: No  . Sexual Activity: No     Comment: TAH/BSO   Other Topics Concern  . None   Social History Narrative    Review of Systems  Constitutional: Positive for activity change, appetite change and fatigue.  Respiratory: Negative for cough, chest tightness, shortness of breath and wheezing.   Cardiovascular:  Positive for chest pain and palpitations. Negative for leg swelling.  Gastrointestinal: Positive for nausea, abdominal pain, diarrhea and abdominal distention. Negative for constipation.    Genitourinary: Positive for flank pain and decreased urine volume. Negative for dysuria, frequency, hematuria, enuresis and difficulty urinating.  Musculoskeletal: Positive for myalgias, back pain, joint swelling, arthralgias, neck pain and neck stiffness. Negative for gait problem.  Neurological: Positive for dizziness and light-headedness.       Objective:  BP 120/79 mmHg  Pulse 109  Temp(Src) 97 F (36.1 C) (Oral)  Resp 18  Ht 5' 4.25" (1.632 m)  Wt 199 lb 12.8 oz (90.629 kg)  BMI 34.03 kg/m2  SpO2 97%  LMP 08/26/1995  Physical Exam  Constitutional: She is oriented to person, place, and time. She appears well-developed and well-nourished. No distress.  HENT:  Head: Normocephalic and atraumatic.  Mouth/Throat: Oropharynx is clear and moist. No oropharyngeal exudate.  Eyes: Pupils are equal, round, and reactive to light.  Neck: Neck supple.  Cardiovascular: Regular rhythm, S1 normal, S2 normal and normal heart sounds.  Tachycardia present.   No murmur heard. Pulmonary/Chest: Effort normal and breath sounds normal. No respiratory distress. She has no wheezes. She has no rales.  Good air movement.  Abdominal: Bowel sounds are normal.  Musculoskeletal: She exhibits no edema.  Neurological: She is alert and oriented to person, place, and time. No cranial nerve deficit.  Skin: Skin is warm and dry. No rash noted.  Psychiatric: She has a normal mood and affect. Her behavior is normal.  Vitals reviewed.   Postive orthostatics. Systolic dropped 121 to 975 standing up.       Assessment & Plan:   Abdominal pain, right upper quadrant - Plan: POCT CBC, POCT urinalysis dipstick, COMPLETE METABOLIC PANEL WITH GFR - unknown etiology -  Start low fat diet. Pt instructed to pay attention to things that may exacerbate or relief her sxs and recheck in 3d w/ colleague PCP Dr. Everlene Farrier.  I pain is persisting at that time cons abd/pelvic CT vs HIDA scan as does have a h/o gallbladder sludge.  Wbc  elev due to CLL  Dehydration - Plan: POCT UA - Microscopic Only - push fluidds  Tachycardia with 100 - 120 beats per minute - suspect due to pain - recheck in 3d.  Hypothyroidism, unspecified hypothyroidism type - Plan: TSH  Orthostatic hypotension  - increase protein in diet  Orders Placed This Encounter  Procedures  . COMPLETE METABOLIC PANEL WITH GFR  . TSH  . POCT CBC  . POCT urinalysis dipstick  . POCT UA - Microscopic Only    I personally performed the services described in this documentation, which was scribed in my presence. The recorded information has been reviewed and considered, and addended by me as needed.  Delman Cheadle, MD MPH   Results for orders placed or performed in visit on 12/07/14  COMPLETE METABOLIC PANEL WITH GFR  Result Value Ref Range   Sodium 140 135 - 145 mEq/L   Potassium 4.4 3.5 - 5.3 mEq/L   Chloride 104 96 - 112 mEq/L   CO2 24 19 - 32 mEq/L   Glucose, Bld 96 70 - 99 mg/dL   BUN 22 6 - 23 mg/dL   Creat 1.15 (H) 0.50 - 1.10 mg/dL   Total Bilirubin 0.3 0.2 - 1.2 mg/dL   Alkaline Phosphatase 75 39 - 117 U/L   AST 32 0 - 37 U/L   ALT 38 (H) 0 - 35 U/L  Total Protein 6.8 6.0 - 8.3 g/dL   Albumin 4.6 3.5 - 5.2 g/dL   Calcium 9.5 8.4 - 10.5 mg/dL   GFR, Est African American 56 (L) mL/min   GFR, Est Non African American 49 (L) mL/min  TSH  Result Value Ref Range   TSH 2.468 0.350 - 4.500 uIU/mL  POCT CBC  Result Value Ref Range   WBC 21.5 (A) 4.6 - 10.2 K/uL   Lymph, poc 14.6 (A) 0.6 - 3.4   POC LYMPH PERCENT 68.0 (A) 10 - 50 %L   MID (cbc) 1.4 (A) 0 - 0.9   POC MID % 6.6 0 - 12 %M   POC Granulocyte 5.5 2 - 6.9   Granulocyte percent 25.4 (A) 37 - 80 %G   RBC 4.35 4.04 - 5.48 M/uL   Hemoglobin 11.6 (A) 12.2 - 16.2 g/dL   HCT, POC 36.2 (A) 37.7 - 47.9 %   MCV 83.1 80 - 97 fL   MCH, POC 26.7 (A) 27 - 31.2 pg   MCHC 32.1 31.8 - 35.4 g/dL   RDW, POC 15.7 %   Platelet Count, POC 252 142 - 424 K/uL   MPV 7.5 0 - 99.8 fL  POCT urinalysis  dipstick  Result Value Ref Range   Color, UA yellow    Clarity, UA clear    Glucose, UA neg    Bilirubin, UA neg    Ketones, UA neg    Spec Grav, UA 1.025    Blood, UA traced-lysed    pH, UA 5.5    Protein, UA trace    Urobilinogen, UA 0.2    Nitrite, UA neg    Leukocytes, UA small (1+)   POCT UA - Microscopic Only  Result Value Ref Range   WBC, Ur, HPF, POC 12-18    RBC, urine, microscopic 0-4    Bacteria, U Microscopic 4+    Mucus, UA neg    Epithelial cells, urine per micros 0-2    Crystals, Ur, HPF, POC neg    Casts, Ur, LPF, POC neg    Yeast, UA neg    Renal tubular cells 0-25f

## 2014-12-08 ENCOUNTER — Other Ambulatory Visit: Payer: Self-pay | Admitting: Oncology

## 2014-12-08 LAB — COMPLETE METABOLIC PANEL WITH GFR
ALK PHOS: 75 U/L (ref 39–117)
ALT: 38 U/L — AB (ref 0–35)
AST: 32 U/L (ref 0–37)
Albumin: 4.6 g/dL (ref 3.5–5.2)
BILIRUBIN TOTAL: 0.3 mg/dL (ref 0.2–1.2)
BUN: 22 mg/dL (ref 6–23)
CO2: 24 meq/L (ref 19–32)
CREATININE: 1.15 mg/dL — AB (ref 0.50–1.10)
Calcium: 9.5 mg/dL (ref 8.4–10.5)
Chloride: 104 mEq/L (ref 96–112)
GFR, EST AFRICAN AMERICAN: 56 mL/min — AB
GFR, EST NON AFRICAN AMERICAN: 49 mL/min — AB
Glucose, Bld: 96 mg/dL (ref 70–99)
Potassium: 4.4 mEq/L (ref 3.5–5.3)
SODIUM: 140 meq/L (ref 135–145)
Total Protein: 6.8 g/dL (ref 6.0–8.3)

## 2014-12-08 LAB — TSH: TSH: 2.468 u[IU]/mL (ref 0.350–4.500)

## 2014-12-10 ENCOUNTER — Ambulatory Visit (INDEPENDENT_AMBULATORY_CARE_PROVIDER_SITE_OTHER): Payer: Medicare Other | Admitting: Emergency Medicine

## 2014-12-10 ENCOUNTER — Ambulatory Visit (INDEPENDENT_AMBULATORY_CARE_PROVIDER_SITE_OTHER): Payer: Medicare Other

## 2014-12-10 VITALS — BP 130/72 | HR 98 | Temp 97.9°F | Resp 18 | Ht 64.0 in | Wt 201.0 lb

## 2014-12-10 DIAGNOSIS — K5909 Other constipation: Secondary | ICD-10-CM

## 2014-12-10 DIAGNOSIS — R1011 Right upper quadrant pain: Secondary | ICD-10-CM | POA: Diagnosis not present

## 2014-12-10 DIAGNOSIS — R8281 Pyuria: Secondary | ICD-10-CM

## 2014-12-10 DIAGNOSIS — N390 Urinary tract infection, site not specified: Secondary | ICD-10-CM

## 2014-12-10 DIAGNOSIS — C9110 Chronic lymphocytic leukemia of B-cell type not having achieved remission: Secondary | ICD-10-CM | POA: Diagnosis not present

## 2014-12-10 LAB — POCT UA - MICROSCOPIC ONLY
Casts, Ur, LPF, POC: NEGATIVE
Crystals, Ur, HPF, POC: NEGATIVE
MUCUS UA: NEGATIVE
RBC, urine, microscopic: NEGATIVE
YEAST UA: NEGATIVE

## 2014-12-10 LAB — POCT URINALYSIS DIPSTICK
Bilirubin, UA: NEGATIVE
Blood, UA: NEGATIVE
GLUCOSE UA: NEGATIVE
KETONES UA: NEGATIVE
Nitrite, UA: NEGATIVE
PH UA: 5.5
Protein, UA: NEGATIVE
SPEC GRAV UA: 1.015
Urobilinogen, UA: 0.2

## 2014-12-10 NOTE — Patient Instructions (Addendum)
Follow up with Dr Jana Hakim for your increasing white counts/ they will call you with an appointment Stop the Imodium  We will call you to set up the CT scan

## 2014-12-10 NOTE — Progress Notes (Signed)
Subjective:    Patient ID: [name] [name], female    DOB: 1946-02-21, 69 y.o.   MRN: [id number]   This chart was scribed for [name], MD by Irene Pap, ED Scribe. This patient was seen in room 11 and patient care was started at 12:51 PM.   HPI HPI Comments: [name] [name] is a 69 y.o. female with a history of breast cancer, fibromyalgia, arthritis, HLD, tachycardia,CLL,  and nephrolithiasis who presents to the [facility] complaining of abdominal and back pain that has been constant. She states that the pain is centralized to the right side of her abdomen. She ranks the pain 5 out of 10. She reports diarrhea that has been consistent for the past couple of months. She denies nausea, vomiting, urinary symptoms, or hematochezia. She states that she had a colonoscopy and endoscopy about a month ago, and that she stopped chemo a few months ago. She reports a history of kidney stones. She states that hot flashes and chills are typical for her since she was first diagnosed with cancer. She reports that she wears a hearing aid in her left ear.   Review of Systems  Constitutional: Positive for chills. Negative for fever.  Gastrointestinal: Positive for abdominal pain and diarrhea. Negative for nausea, vomiting and blood in stool.  Genitourinary: Negative for dysuria, frequency, hematuria, difficulty urinating and dyspareunia.  Musculoskeletal: Positive for back pain.      Objective:   Physical Exam  Constitutional: She appears well-developed and well-nourished. No distress.  HENT:  Head: Normocephalic and atraumatic.  Eyes: Conjunctivae are normal. Right eye exhibits no discharge. Left eye exhibits no discharge.  Neck: Neck supple.  Cardiovascular: Normal rate, regular rhythm and normal heart sounds.  Exam reveals no gallop and no friction rub.   No murmur heard. Pulmonary/Chest: Effort normal and breath sounds normal. No respiratory distress.  Abdominal: Soft. She  exhibits no distension. There is tenderness in the right upper quadrant.  No masses  Genitourinary:  No flank tenderness  Musculoskeletal: She exhibits no edema or tenderness.  Neurological: She is alert.  Skin: Skin is warm.  Skin is slightly sweaty  Psychiatric: She has a normal mood and affect. Her behavior is normal. Thought content normal.  Nursing note and vitals reviewed.  Results for orders placed or performed in visit on 12/10/14  POCT urinalysis dipstick  Result Value Ref Range   Color, UA yellow    Clarity, UA hazy    Glucose, UA neg    Bilirubin, UA neg    Ketones, UA neg    Spec Grav, UA 1.015    Blood, UA neg    pH, UA 5.5    Protein, UA neg    Urobilinogen, UA 0.2    Nitrite, UA neg    Leukocytes, UA Trace   POCT UA - Microscopic Only  Result Value Ref Range   WBC, Ur, HPF, POC 5-10    RBC, urine, microscopic neg    Bacteria, U Microscopic 4+    Mucus, UA neg    Epithelial cells, urine per micros 0-4    Crystals, Ur, HPF, POC neg    Casts, Ur, LPF, POC neg    Yeast, UA neg    Renal tubular cells 0-1   [facility] reading (PRIMARY) by  Dr.Eleen Litz there is large amount of stool on the right side: There are no acute changes there is an elevated diaphragm no other abnormality seen  Assessment & Plan:  Patient presents with right upper abdominal pain. She has had a normal ultrasound and minimal elevation in her LFTs. She does have fatty changes of the liver. She has a history of CLL so it is difficult to follow her white counts. Her urine does have some white cells and culture was done. We'll proceed with CT abdomen pelvis.I personally performed the services described in this documentation, which was scribed in my presence. The recorded information has been reviewed and is accurate.

## 2014-12-11 ENCOUNTER — Encounter (HOSPITAL_COMMUNITY): Payer: Self-pay

## 2014-12-11 ENCOUNTER — Ambulatory Visit (HOSPITAL_COMMUNITY)
Admission: RE | Admit: 2014-12-11 | Discharge: 2014-12-11 | Disposition: A | Discharge status: Home / Self Care | Payer: Medicare Other | Source: Ambulatory Visit | Attending: Emergency Medicine | Admitting: Emergency Medicine

## 2014-12-11 ENCOUNTER — Telehealth: Payer: Self-pay | Admitting: *Deleted

## 2014-12-11 DIAGNOSIS — R1011 Right upper quadrant pain: Secondary | ICD-10-CM | POA: Diagnosis present

## 2014-12-11 MED ORDER — IOHEXOL 300 MG/ML  SOLN
100.0000 mL | Freq: Once | INTRAMUSCULAR | Status: AC | PRN
  Administered 2014-12-11: 100 mL via INTRAVENOUS

## 2014-12-11 NOTE — Telephone Encounter (Signed)
Patient was here with her daughter. Be sure the daughter is also caled so we can be sure she keeps her Appt.

## 2014-12-11 NOTE — Telephone Encounter (Signed)
Called daughter. No answer and mailbox full.

## 2014-12-11 NOTE — Telephone Encounter (Signed)
Called and left message on pt home phone.  Advised pt to report to [facility] at 11 am at the radiology department for an scheduled CT Abd/pelvis.  Pt was also advised to call back if she had any further questions or concerns.

## 2014-12-12 ENCOUNTER — Other Ambulatory Visit: Payer: Self-pay | Admitting: Emergency Medicine

## 2014-12-12 ENCOUNTER — Encounter: Payer: Self-pay | Admitting: Family Medicine

## 2014-12-12 DIAGNOSIS — N3000 Acute cystitis without hematuria: Secondary | ICD-10-CM

## 2014-12-12 DIAGNOSIS — N2889 Other specified disorders of kidney and ureter: Secondary | ICD-10-CM

## 2014-12-12 LAB — URINE CULTURE: Colony Count: >=100000

## 2014-12-12 MED ORDER — CEPHALEXIN 500 MG PO CAPS: 500.0000 mg | ORAL_CAPSULE | Freq: Three times a day (TID) | ORAL | Status: DC

## 2014-12-18 ENCOUNTER — Other Ambulatory Visit: Payer: Self-pay | Admitting: Urology

## 2014-12-18 DIAGNOSIS — D49519 Neoplasm of unspecified behavior of unspecified kidney: Secondary | ICD-10-CM

## 2014-12-21 ENCOUNTER — Other Ambulatory Visit (HOSPITAL_BASED_OUTPATIENT_CLINIC_OR_DEPARTMENT_OTHER): Payer: Medicare Other

## 2014-12-21 ENCOUNTER — Other Ambulatory Visit: Payer: Medicare Other

## 2014-12-21 ENCOUNTER — Ambulatory Visit (HOSPITAL_BASED_OUTPATIENT_CLINIC_OR_DEPARTMENT_OTHER): Payer: Medicare Other | Admitting: Nurse Practitioner

## 2014-12-21 ENCOUNTER — Encounter: Payer: Self-pay | Admitting: Nurse Practitioner

## 2014-12-21 ENCOUNTER — Telehealth: Payer: Self-pay | Admitting: Oncology

## 2014-12-21 VITALS — BP 127/69 | HR 118 | Temp 98.9°F | Resp 18 | Ht 64.0 in | Wt 198.9 lb

## 2014-12-21 DIAGNOSIS — Z856 Personal history of leukemia: Secondary | ICD-10-CM

## 2014-12-21 DIAGNOSIS — C9110 Chronic lymphocytic leukemia of B-cell type not having achieved remission: Secondary | ICD-10-CM

## 2014-12-21 DIAGNOSIS — C50511 Malignant neoplasm of lower-outer quadrant of right female breast: Secondary | ICD-10-CM

## 2014-12-21 DIAGNOSIS — C50911 Malignant neoplasm of unspecified site of right female breast: Secondary | ICD-10-CM

## 2014-12-21 DIAGNOSIS — D649 Anemia, unspecified: Secondary | ICD-10-CM

## 2014-12-21 DIAGNOSIS — Z170 Estrogen receptor positive status [ER+]: Secondary | ICD-10-CM | POA: Diagnosis not present

## 2014-12-21 LAB — CBC WITH DIFFERENTIAL/PLATELET
BASO%: 1.2 % (ref 0.0–2.0)
Basophils Absolute: 0.2 10*3/uL — ABNORMAL HIGH (ref 0.0–0.1)
EOS ABS: 0.4 10*3/uL (ref 0.0–0.5)
EOS%: 2.4 % (ref 0.0–7.0)
HCT: 36.8 % (ref 34.8–46.6)
HGB: 11.7 g/dL (ref 11.6–15.9)
LYMPH%: 65.3 % — AB (ref 14.0–49.7)
MCH: 26.5 pg (ref 25.1–34.0)
MCHC: 31.7 g/dL (ref 31.5–36.0)
MCV: 83.4 fL (ref 79.5–101.0)
MONO#: 1 10*3/uL — ABNORMAL HIGH (ref 0.1–0.9)
MONO%: 5.5 % (ref 0.0–14.0)
NEUT%: 25.6 % — ABNORMAL LOW (ref 38.4–76.8)
NEUTROS ABS: 4.8 10*3/uL (ref 1.5–6.5)
PLATELETS: 205 10*3/uL (ref 145–400)
RBC: 4.42 10*6/uL (ref 3.70–5.45)
RDW: 14.3 % (ref 11.2–14.5)
WBC: 18.8 10*3/uL — ABNORMAL HIGH (ref 3.9–10.3)
lymph#: 12.3 10*3/uL — ABNORMAL HIGH (ref 0.9–3.3)
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
## 2014-12-21 NOTE — Telephone Encounter (Signed)
gave and printed appt sched for May and Aug....pt goes to [facility] on 5.5 @ 11:30

## 2014-12-27 ENCOUNTER — Ambulatory Visit
Admission: RE | Admit: 2014-12-27 | Discharge: 2014-12-27 | Disposition: A | Discharge status: Home / Self Care | Payer: Medicare Other | Source: Ambulatory Visit | Attending: Urology | Admitting: Urology

## 2014-12-27 DIAGNOSIS — D49519 Neoplasm of unspecified behavior of unspecified kidney: Secondary | ICD-10-CM

## 2014-12-27 MED ORDER — GADOBENATE DIMEGLUMINE 529 MG/ML IV SOLN
17.0000 mL | Freq: Once | INTRAVENOUS | Status: AC | PRN
  Administered 2014-12-27: 17 mL via INTRAVENOUS

## 2015-01-04 ENCOUNTER — Ambulatory Visit (INDEPENDENT_AMBULATORY_CARE_PROVIDER_SITE_OTHER): Payer: Medicare Other

## 2015-01-04 ENCOUNTER — Ambulatory Visit (INDEPENDENT_AMBULATORY_CARE_PROVIDER_SITE_OTHER): Payer: Medicare Other | Admitting: Emergency Medicine

## 2015-01-04 VITALS — BP 130/60 | HR 70 | Temp 98.0°F | Ht 64.25 in | Wt 204.4 lb

## 2015-01-04 DIAGNOSIS — R059 Cough, unspecified: Secondary | ICD-10-CM

## 2015-01-04 DIAGNOSIS — R05 Cough: Secondary | ICD-10-CM | POA: Diagnosis not present

## 2015-01-04 DIAGNOSIS — J209 Acute bronchitis, unspecified: Secondary | ICD-10-CM | POA: Diagnosis not present

## 2015-01-04 MED ORDER — CLARITHROMYCIN 500 MG PO TABS: 500.0000 mg | ORAL_TABLET | Freq: Two times a day (BID) | ORAL | Status: DC

## 2015-01-04 MED ORDER — HYDROCOD POLST-CPM POLST ER 10-8 MG/5ML PO SUER: 5.0000 mL | Freq: Two times a day (BID) | ORAL | Status: DC

## 2015-01-04 MED ORDER — ALBUTEROL SULFATE HFA 108 (90 BASE) MCG/ACT IN AERS: 2.0000 | INHALATION_SPRAY | RESPIRATORY_TRACT | Status: DC | PRN

## 2015-01-04 NOTE — Patient Instructions (Signed)

## 2015-01-04 NOTE — Progress Notes (Signed)
[facility] [address] [place] [address] [phone]  Date:  01/04/2015   Name:  [name] [name]   DOB:  12/01/1945   MRN:  [id number]  PCP:  [name], MD    Chief Complaint: Cough; Nasal Congestion; and Shortness of Breath   History of Present Illness:  [name] [name] is a 69 y.o. very pleasant female patient who presents with the following:  Cough for the past month with purulent sputum Some wheezing and exertional shortness of breath Some nasal congestion No pain in ears Some fevered feeling no chills history of CLL and breast cancer.  Now pending treatment for a renal met. No improvement with over the counter medications or other home remedies.  Denies other complaint or health concern today.   Patient Active Problem List   Diagnosis Date Noted  . Ejection fraction   . Anemia, unspecified 10/25/2013  . Diarrhea 10/04/2013  . GERD (gastroesophageal reflux disease) 09/27/2013  . Seroma complicating a procedure 08/12/2013  . CLL (chronic lymphocytic leukemia) 07/27/2013  . Breast cancer of lower-outer quadrant of right female breast 07/26/2013  . Nephrolithiasis 06/16/2013  . 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 Chronic pain syndrome 05/10/2013  . H/O long-term treatment with high-risk medication 05/10/2013  . DDD (degenerative disc disease) 05/10/2013  . SOB (shortness of breath)   . Sinus tachycardia   . HLD (hyperlipidemia)   . Orthostatic hypotension   . Anxiety   . Arthritis   . Diverticulosis   . Fibromyalgia   . Hypothyroidism     Past Medical History  Diagnosis Date  . Sinus tachycardia     mild resting  . HLD (hyperlipidemia)   . Orthostatic hypotension     slight in the past  . Anxiety   . Depression   . Diverticulosis   . Fibromyalgia 1978  . Hypothyroidism   . Alopecia   . Ruptured disk     one in neck and two in back  . Plantar fasciitis   . GERD (gastroesophageal reflux disease)   . History of syncope      episode 2008--  no recurrence since  . Ureteral calculi     bilateral  . Leukocytosis   . Chronic back pain     "lower back and upper neck" (07/28/2013)  . 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 ear   . Urgency of urination   . Hematuria   . Sensation of pressure in bladder area   . Wears glasses   . Ecchymosis     FROM IV AND LAB WORK THE PAST WEEK  06-17-2013  . Stool incontinence     "at times recently"   . Kidney stones 2014  . CAP (community acquired pneumonia)     admission 06-04-2013 secondary to failed oral antibitotics---  last cxr 06-16-2013  much improved  . SOB (shortness of breath) 06-17-2013 CURRENTLY RESIDUAL FROM RECENT CAP    SOB from obesity and deconditioning 2009- eval included CPX  . Arthritis     "spine" (07/28/2013)  . DDD (degenerative disc disease)   . CLL (chronic lymphocytic leukemia) dx'd 05/2013    "I don't think I have it though" (07/28/2013)  . Breast cancer 1997; 2014    right  . Ejection fraction     Past Surgical History  Procedure Laterality Date  . Lumbar epidural injection      has had 7 injections  . Total abdominal hysterectomy w/ bilateral salpingoophorectomy  1997  .  Breast lumpectomy with axillary lymph node dissection Right 1997  . Transthoracic echocardiogram  05-11-2007  dr Ron Parker    normal lvf/  ef 65%  . Tonsillectomy  AGE 74  . Retinal detachment surgery Right 2013  . Cystoscopy with retrograde pyelogram, ureteroscopy and stent placement Bilateral 06/17/2013    Procedure: CYSTOSCOPY WITH RETROGRADE PYELOGRAM, URETEROSCOPY AND LEFT DOUBLE  J STENT PLACEMENT RIGHT URETERAL HOLMIIUM LASER AND DOUBLE J STENT ;  Surgeon: Hanley Ben, MD;  Location: [place];  Service: Urology;  Laterality: Bilateral;  . Holmium laser application Bilateral 67/07/4579    Procedure: HOLMIUM LASER APPLICATION;  Surgeon: Hanley Ben, MD;  Location: [place];  Service: Urology;  Laterality: Bilateral;  . Mastectomy w/ nodes  partial Right 1997  . Mastectomy complete / simple Right 07/28/2013  . Appendectomy  1997  . Breast biopsy Right 2014  . Cystoscopy with ureteroscopy, stone basketry and stent placement Bilateral 2014  . Lithotripsy Left ~ 06/2013  . Simple mastectomy with axillary sentinel node biopsy Right 07/28/2013    Procedure: RIGHT TOTAL  MASTECTOMY;  Surgeon: Imogene Burn. Georgette Dover, MD;  Location: [place];  Service: General;  Laterality: Right;  . Portacath placement Left 07/28/2013    Procedure: ATTEMPTED INSERTION PORT-A-CATH;  Surgeon: Imogene Burn. Georgette Dover, MD;  Location: [place];  Service: General;  Laterality: Left;  . Port-a-cath removal Left 10/03/2014    Procedure: REMOVAL PORT-A-CATH;  Surgeon: Donnie Mesa, MD;  Location: [place];  Service: General;  Laterality: Left;    History  Substance Use Topics  . Smoking status: Never Smoker   . Smokeless tobacco: Never Used  . Alcohol Use: No     Comment: 07/28/2013 "no alcohol since 1994; never had problem w/it"    Family History  Problem Relation Age of Onset  . Heart disease Maternal Grandfather   . Diabetes Maternal Grandfather   . Colon cancer Maternal Aunt 79  . Brain cancer Paternal Grandmother     dx in 29s  . Dementia Mother   . Diabetes Mother   . Osteoporosis Mother   . Diabetes Maternal Aunt   . Prostate cancer Father 27  . Bipolar disorder Maternal Aunt   . Stroke Maternal Aunt   . Stomach cancer Paternal Uncle     dx in late 35s    Allergies  Allergen Reactions  . Lasix [Furosemide] Nausea And Vomiting and Other (See Comments)    headache  . Lithium Other (See Comments)    Dizziness, "cause me to fall"  . Sulfa Antibiotics Hives  . Trazodone And Nefazodone Other (See Comments)    insomnia  . Lyrica [Pregabalin] Diarrhea, Nausea Only and Rash    Medication list has been reviewed and updated.  Current Outpatient Prescriptions on File Prior to Visit  Medication Sig Dispense Refill  . ALPRAZolam (XANAX) 0.5 MG  tablet TAKE 1 TABLET EVERY 8 HOURS AS NEEDED (Patient taking differently: TAKE 1 TABLET BY MOUTH EVERY 12 HOURS .) 30 tablet 0  . amitriptyline (ELAVIL) 150 MG tablet Take 150 mg by mouth at bedtime.    Marland Kitchen anastrozole (ARIMIDEX) 1 MG tablet TAKE 1 TABLET (1 MG TOTAL) BY MOUTH DAILY. 90 tablet 0  . Ascorbic Acid (VITAMIN C) 1000 MG tablet Take 500 mg by mouth daily.     Marland Kitchen BIOTIN PO Take by mouth.    . Cholecalciferol (VITAMIN D-3) 1000 UNITS CAPS Take 5,000 Units by mouth daily.    Marland Kitchen ibuprofen (ADVIL,MOTRIN)  200 MG tablet Take 800 mg by mouth every 6 (six) hours as needed for moderate pain.    Marland Kitchen levothyroxine (SYNTHROID, LEVOTHROID) 50 MCG tablet Take 1 tablet (50 mcg total) by mouth daily before breakfast. 90 tablet 3  . metoprolol succinate (TOPROL-XL) 25 MG 24 hr tablet TAKE 1 TABLET (25 MG TOTAL) BY MOUTH DAILY. 30 tablet 5  . minoxidil (ROGAINE) 2 % external solution Apply 1 application topically 2 (two) times daily as needed.     . Multiple Vitamins-Minerals (HAIR VITAMINS PO) Take 1 tablet by mouth 2 (two) times daily.    . Omega-3 Fatty Acids (FISH OIL PO) Take 1 capsule by mouth daily.     Marland Kitchen omeprazole (PRILOSEC) 40 MG capsule TAKE ONE CAPSULE BY MOUTH EVERY MORNING 90 capsule 0  . Oyster Shell (OYSTER CALCIUM) 500 MG TABS tablet Take 500 mg of elemental calcium by mouth 2 (two) times daily.    Marland Kitchen REXULTI 0.5 MG TABS   0  . UNABLE TO FIND Take 0.05 mg by mouth at bedtime. Rezulti Immune Complex w/ Zinc    . VIIBRYD 40 MG TABS   1   No current facility-administered medications on file prior to visit.    Review of Systems:  Review of Systems  Constitutional: Negative for fever, chills and fatigue.  HENT: Negative for congestion, ear pain, hearing loss, postnasal drip, rhinorrhea and sinus pressure.   Eyes: Negative for discharge and redness.  Respiratory: Negative for cough, shortness of breath and wheezing.   Cardiovascular: Negative for chest pain and leg swelling.   Gastrointestinal: Negative for nausea, vomiting, abdominal pain, constipation and blood in stool.  Genitourinary: Negative for dysuria, urgency and frequency.  Musculoskeletal: Negative for neck stiffness.  Skin: Negative for rash.  Neurological: Negative for seizures, weakness and headaches.   Physical Examination: Filed Vitals:   01/04/15 1807  BP: 130/60  Pulse: 70  Temp: 98 F (36.7 C)   Filed Vitals:   01/04/15 1807  Height: 5' 4.25" (1.632 m)  Weight: 204 lb 6 oz (92.704 kg)   Body mass index is 34.81 kg/(m^2). Ideal Body Weight: Weight in (lb) to have BMI = 25: 146.5  GEN: WDWN, NAD, Non-toxic, A & O x 3 HEENT: Atraumatic, Normocephalic. Neck supple. No masses, No LAD. Ears and Nose: No external deformity. CV: RRR, No M/G/R. No JVD. No thrill. No extra heart sounds. PULM: CTA B, no wheezes, crackles, rhonchi. No retractions. No resp. distress. No accessory muscle use. ABD: S, NT, ND, +BS. No rebound. No HSM. EXTR: No c/c/e NEURO Normal gait.  PSYCH: Normally interactive. Conversant. Not depressed or anxious appearing.  Calm demeanor.    Assessment and Plan: 1. Cough Increase fluids and rest - DG Chest 2 View; Future - clarithromycin (BIAXIN) 500 MG tablet; Take 1 tablet (500 mg total) by mouth 2 (two) times daily.  Dispense: 20 tablet; Refill: 0 - chlorpheniramine-HYDROcodone (TUSSIONEX PENNKINETIC ER) 10-8 MG/5ML SUER; Take 5 mLs by mouth 2 (two) times daily.  Dispense: 60 mL; Refill: 0 - albuterol (PROVENTIL HFA;VENTOLIN HFA) 108 (90 BASE) MCG/ACT inhaler; Inhale 2 puffs into the lungs every 4 (four) hours as needed for wheezing or shortness of breath (cough, shortness of breath or wheezing.).  Dispense: 1 Inhaler; Refill: 1  2. Acute bronchitis, unspecified organism   - clarithromycin (BIAXIN) 500 MG tablet; Take 1 tablet (500 mg total) by mouth 2 (two) times daily.  Dispense: 20 tablet; Refill: 0 - chlorpheniramine-HYDROcodone (TUSSIONEX PENNKINETIC ER) 10-8  MG/5ML  SUER; Take 5 mLs by mouth 2 (two) times daily.  Dispense: 60 mL; Refill: 0 - albuterol (PROVENTIL HFA;VENTOLIN HFA) 108 (90 BASE) MCG/ACT inhaler; Inhale 2 puffs into the lungs every 4 (four) hours as needed for wheezing or shortness of breath (cough, shortness of breath or wheezing.).  Dispense: 1 Inhaler; Refill: 1    Signed Ellison Carwin, MD  [facility] reading (PRIMARY) by  Dr. Ouida Sills.  No acute disease.

## 2015-01-11 ENCOUNTER — Other Ambulatory Visit: Payer: Self-pay | Admitting: Urology

## 2015-01-18 ENCOUNTER — Encounter: Payer: Self-pay | Admitting: Obstetrics & Gynecology

## 2015-01-25 ENCOUNTER — Ambulatory Visit (INDEPENDENT_AMBULATORY_CARE_PROVIDER_SITE_OTHER): Payer: Medicare Other | Admitting: Emergency Medicine

## 2015-01-25 VITALS — BP 128/80 | HR 87 | Temp 98.5°F | Resp 20 | Ht 64.25 in | Wt 197.1 lb

## 2015-01-25 DIAGNOSIS — R1011 Right upper quadrant pain: Secondary | ICD-10-CM | POA: Diagnosis not present

## 2015-01-25 DIAGNOSIS — K219 Gastro-esophageal reflux disease without esophagitis: Secondary | ICD-10-CM

## 2015-01-25 MED ORDER — DEXLANSOPRAZOLE 60 MG PO CPDR: 60.0000 mg | DELAYED_RELEASE_CAPSULE | Freq: Every day | ORAL | Status: DC

## 2015-01-25 NOTE — Progress Notes (Signed)
Subjective:  Patient ID: [name] [name], female    DOB: 26-Dec-1945  Age: 69 y.o. MRN: [id number]  CC: Cough and Fatigue   HPI [name] [name] presents  with persistent cough. She's been treated twice with antibiotic for presumed bronchitis. She is describes symptoms of water brash and morning cough. Her cough is worse when she lays down. As persistent heartburn for which he takes periodic Prilosec. She is known to have gastroesophageal reflux disorder. She has no nausea or vomiting. Cough is nonproductive. She has some fatigue and and sensation of shortness of breath. She has no fever or chills.  She has right upper quadrant pain despite having a negative ultrasound I still think there is possibility that she has cholecystitis. She says the pain is particularly annoying one of her cats jumped up on her lap and lands on her right upper quadrant.  She is pending a right partial nephrectomy for what is presumed to be a renal cell cancer.  She takes Prilosec intermittently and says that does not fully control her symptoms. Eyes any over-the-counter medication giving her relief.   Outpatient Prescriptions Prior to Visit  Medication Sig Dispense Refill  . albuterol (PROVENTIL HFA;VENTOLIN HFA) 108 (90 BASE) MCG/ACT inhaler Inhale 2 puffs into the lungs every 4 (four) hours as needed for wheezing or shortness of breath (cough, shortness of breath or wheezing.). 1 Inhaler 1  . ALPRAZolam (XANAX) 0.5 MG tablet TAKE 1 TABLET EVERY 8 HOURS AS NEEDED (Patient taking differently: TAKE 1 TABLET BY MOUTH EVERY 12 HOURS .) 30 tablet 0  . amitriptyline (ELAVIL) 150 MG tablet Take 150 mg by mouth at bedtime.    Marland Kitchen anastrozole (ARIMIDEX) 1 MG tablet TAKE 1 TABLET (1 MG TOTAL) BY MOUTH DAILY. 90 tablet 0  . Ascorbic Acid (VITAMIN C) 1000 MG tablet Take 500 mg by mouth daily.     Marland Kitchen BIOTIN PO Take by mouth.    . Cholecalciferol (VITAMIN D-3) 1000 UNITS CAPS Take 5,000 Units by mouth daily.    Marland Kitchen ibuprofen  (ADVIL,MOTRIN) 200 MG tablet Take 800 mg by mouth every 6 (six) hours as needed for moderate pain.    Marland Kitchen levothyroxine (SYNTHROID, LEVOTHROID) 50 MCG tablet Take 1 tablet (50 mcg total) by mouth daily before breakfast. 90 tablet 3  . metoprolol succinate (TOPROL-XL) 25 MG 24 hr tablet TAKE 1 TABLET (25 MG TOTAL) BY MOUTH DAILY. 30 tablet 5  . Multiple Vitamins-Minerals (HAIR VITAMINS PO) Take 1 tablet by mouth 2 (two) times daily.    . Omega-3 Fatty Acids (FISH OIL PO) Take 1 capsule by mouth daily.     Marland Kitchen omeprazole (PRILOSEC) 40 MG capsule TAKE ONE CAPSULE BY MOUTH EVERY MORNING 90 capsule 0  . Oyster Shell (OYSTER CALCIUM) 500 MG TABS tablet Take 500 mg of elemental calcium by mouth 2 (two) times daily.    Marland Kitchen REXULTI 0.5 MG TABS   0  . UNABLE TO FIND Take 0.05 mg by mouth at bedtime. Rezulti Immune Complex w/ Zinc    . VIIBRYD 40 MG TABS   1  . chlorpheniramine-HYDROcodone (TUSSIONEX PENNKINETIC ER) 10-8 MG/5ML SUER Take 5 mLs by mouth 2 (two) times daily. (Patient not taking: Reported on 01/25/2015) 60 mL 0  . clarithromycin (BIAXIN) 500 MG tablet Take 1 tablet (500 mg total) by mouth 2 (two) times daily. (Patient not taking: Reported on 01/25/2015) 20 tablet 0  . minoxidil (ROGAINE) 2 % external solution Apply 1 application topically 2 (two) times daily  as needed.      No facility-administered medications prior to visit.    History   Social History  . Marital Status: Married    Spouse Name: N/A  . Number of Children: 1  . Years of Education: N/A   Occupational History  . homemaker    Social History Main Topics  . Smoking status: Never Smoker   . Smokeless tobacco: Never Used  . Alcohol Use: No     Comment: 07/28/2013 "no alcohol since 1994; never had problem w/it"  . Drug Use: No  . Sexual Activity: No     Comment: TAH/BSO   Other Topics Concern  . None   Social History Narrative    Family History  Problem Relation Age of Onset  . Heart disease Maternal Grandfather   .  Diabetes Maternal Grandfather   . Colon cancer Maternal Aunt 79  . Brain cancer Paternal Grandmother     dx in 9s  . Dementia Mother   . Diabetes Mother   . Osteoporosis Mother   . Diabetes Maternal Aunt   . Prostate cancer Father 79  . Bipolar disorder Maternal Aunt   . Stroke Maternal Aunt   . Stomach cancer Paternal Uncle     dx in late 70s    Past Medical History  Diagnosis Date  . Sinus tachycardia     mild resting  . HLD (hyperlipidemia)   . Orthostatic hypotension     slight in the past  . Anxiety   . Depression   . Diverticulosis   . Fibromyalgia 1978  . Hypothyroidism   . Alopecia   . Ruptured disk     one in neck and two in back  . Plantar fasciitis   . GERD (gastroesophageal reflux disease)   . History of syncope     episode 2008--  no recurrence since  . Ureteral calculi     bilateral  . Leukocytosis   . Chronic back pain     "lower back and upper neck" (07/28/2013)  . 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 ear   . Urgency of urination   . Hematuria   . Sensation of pressure in bladder area   . Wears glasses   . Ecchymosis     FROM IV AND LAB WORK THE PAST WEEK  06-17-2013  . Stool incontinence     "at times recently"   . Kidney stones 2014  . CAP (community acquired pneumonia)     admission 06-04-2013 secondary to failed oral antibitotics---  last cxr 06-16-2013  much improved  . SOB (shortness of breath) 06-17-2013 CURRENTLY RESIDUAL FROM RECENT CAP    SOB from obesity and deconditioning 2009- eval included CPX  . Arthritis     "spine" (07/28/2013)  . DDD (degenerative disc disease)   . CLL (chronic lymphocytic leukemia) dx'd 05/2013    "I don't think I have it though" (07/28/2013)  . Breast cancer 1997; 2014    right  . Ejection fraction      Review of Systems  Constitutional: Negative for fever, chills and appetite change.  HENT: Negative for congestion, ear pain, postnasal drip, sinus pressure and sore throat.   Eyes: Negative for pain and redness.    Respiratory: Negative for cough, shortness of breath and wheezing.   Cardiovascular: Negative for leg swelling.  Gastrointestinal: Positive for abdominal pain and abdominal distention. Negative for nausea, vomiting, diarrhea, constipation and blood in stool.  Endocrine: Negative for polyuria.  Genitourinary: Negative for dysuria, urgency, frequency and  flank pain.  Musculoskeletal: Negative for gait problem.  Skin: Negative for rash.  Neurological: Positive for weakness. Negative for headaches.  Psychiatric/Behavioral: Negative for confusion and decreased concentration. The patient is not nervous/anxious.     Objective:  BP 128/80 mmHg  Pulse 87  Temp(Src) 98.5 F (36.9 C) (Oral)  Resp 20  Ht 5' 4.25" (1.632 m)  Wt 197 lb 2 oz (89.415 kg)  BMI 33.57 kg/m2  SpO2 97%  LMP 08/26/1995  BP Readings from Last 3 Encounters:  01/25/15 128/80  01/04/15 130/60  12/21/14 127/69    Wt Readings from Last 3 Encounters:  01/25/15 197 lb 2 oz (89.415 kg)  01/04/15 204 lb 6 oz (92.704 kg)  12/21/14 198 lb 14.4 oz (90.22 kg)    Physical Exam  Constitutional: She is oriented to person, place, and time. She appears well-developed and well-nourished.  HENT:  Head: Normocephalic and atraumatic.  Eyes: Conjunctivae are normal. Pupils are equal, round, and reactive to light.  Neck: Normal range of motion. Neck supple.  Cardiovascular: Normal rate, regular rhythm and normal heart sounds.   Pulmonary/Chest: Effort normal.  Abdominal: Soft. Bowel sounds are normal. There is tenderness.  Musculoskeletal: Normal range of motion. She exhibits no edema.  Neurological: She is alert and oriented to person, place, and time.  Skin: Skin is warm and dry.  Psychiatric: She has a normal mood and affect. Her behavior is normal. Thought content normal.    Lab Results  Component Value Date   WBC 18.8* 12/21/2014   HGB 11.7 12/21/2014   HCT 36.8 12/21/2014   PLT 205 12/21/2014   GLUCOSE 96 12/07/2014    ALT 38* 12/07/2014   AST 32 12/07/2014   NA 140 12/07/2014   K 4.4 12/07/2014   CL 104 12/07/2014   CREATININE 1.15* 12/07/2014   BUN 22 12/07/2014   CO2 24 12/07/2014   TSH 2.468 12/07/2014   INR 0.94 07/29/2013      .  Assessment & Plan:   Chondra was seen today for cough and fatigue.  Diagnoses and all orders for this visit:  Gastroesophageal reflux disease, esophagitis presence not specified  RUQ abdominal pain Orders: -     NM Hepato W/Eject Fract; Future  Other orders -     dexlansoprazole (DEXILANT) 60 MG capsule; Take 1 capsule (60 mg total) by mouth daily.   I am having Ms. Carra start on dexlansoprazole. I am also having her maintain her amitriptyline, Omega-3 Fatty Acids (FISH OIL PO), vitamin C, Vitamin D-3, minoxidil, Multiple Vitamins-Minerals (HAIR VITAMINS PO), ibuprofen, ALPRAZolam, levothyroxine, oyster calcium, metoprolol succinate, UNABLE TO FIND, BIOTIN PO, anastrozole, REXULTI, omeprazole, VIIBRYD, clarithromycin, chlorpheniramine-HYDROcodone, and albuterol.  Meds ordered this encounter  Medications  . dexlansoprazole (DEXILANT) 60 MG capsule    Sig: Take 1 capsule (60 mg total) by mouth daily.    Dispense:  30 capsule    Refill:  5   HIDA scan was ordered and we'll see what the results are hopefully with that done before kidney surgery. I Suggested she take the DEXA 1 every day and stop the Prilosec. And I suggested also that she can touch with her gastroenterologist soon.    Appropriate red flag conditions were discussed with the patient as well as actions that should be taken.  Patient expressed his understanding.  Follow-up: Return if symptoms worsen or fail to improve.  Roselee Culver, MD

## 2015-01-26 ENCOUNTER — Other Ambulatory Visit: Payer: Self-pay | Admitting: Oncology

## 2015-01-29 NOTE — Patient Instructions (Signed)
[name] [name]  01/29/2015   Your procedure is scheduled on:    02/05/2015    Report to [facility] Main  Entrance and follow signs to               [place] at     0900 AM.  Call this number if you have problems the morning of surgery [phone]   Remember: ONLY 1 PERSON MAY GO WITH YOU TO SHORT STAY TO GET  READY MORNING OF [place].  Do not eat food or drink liquids :After Midnight.     Take these medicines the morning of surgery with A SIP OF WATER:   Albuterol inhaler if needed and bring, Xanax, Dexilant., Synthroid, Toprol ( Metoprolol), Prilosec                                You may not have any metal on your body including hair pins and              piercings  Do not wear jewelry, make-up, lotions, powders or perfumes, deodorant             Do not wear nail polish.  Do not shave  48 hours prior to surgery.                Do not bring valuables to the hospital. [place].  Contacts, dentures or bridgework may not be worn into surgery.  Leave suitcase in the car. After surgery it may be brought to your room.         Special Instructions: coughing and deep breathing exercises, leg exercises               Please read over the following fact sheets you were given: _____________________________________________________________________             [facility] - Preparing for Surgery Before surgery, you can play an important role.  Because skin is not sterile, your skin needs to be as free of germs as possible.  You can reduce the number of germs on your skin by washing with CHG (chlorahexidine gluconate) soap before surgery.  CHG is an antiseptic cleaner which kills germs and bonds with the skin to continue killing germs even after washing. Please DO NOT use if you have an allergy to CHG or antibacterial soaps.  If your skin becomes reddened/irritated stop using the CHG and inform your nurse  when you arrive at Short Stay. Do not shave (including legs and underarms) for at least 48 hours prior to the first CHG shower.  You may shave your face/neck. Please follow these instructions carefully:  1.  Shower with CHG Soap the night before surgery and the  morning of Surgery.  2.  If you choose to wash your hair, wash your hair first as usual with your  normal  shampoo.  3.  After you shampoo, rinse your hair and body thoroughly to remove the  shampoo.                           4.  Use CHG as you would any other liquid soap.  You can apply chg directly  to the skin  and wash                       Gently with a scrungie or clean washcloth.  5.  Apply the CHG Soap to your body ONLY FROM THE NECK DOWN.   Do not use on face/ open                           Wound or open sores. Avoid contact with eyes, ears mouth and genitals (private parts).                       Wash face,  Genitals (private parts) with your normal soap.             6.  Wash thoroughly, paying special attention to the area where your surgery  will be performed.  7.  Thoroughly rinse your body with warm water from the neck down.  8.  DO NOT shower/wash with your normal soap after using and rinsing off  the CHG Soap.                9.  Pat yourself dry with a clean towel.            10.  Wear clean pajamas.            11.  Place clean sheets on your bed the night of your first shower and do not  sleep with pets. Day of Surgery : Do not apply any lotions/deodorants the morning of surgery.  Please wear clean clothes to the hospital/surgery center.  FAILURE TO FOLLOW THESE INSTRUCTIONS MAY RESULT IN THE CANCELLATION OF YOUR SURGERY PATIENT SIGNATURE_________________________________  NURSE SIGNATURE__________________________________  ________________________________________________________________________  WHAT IS A BLOOD TRANSFUSION? Blood Transfusion Information  A transfusion is the replacement of blood or some of its  parts. Blood is made up of multiple cells which provide different functions.  Red blood cells carry oxygen and are used for blood loss replacement.  White blood cells fight against infection.  Platelets control bleeding.  Plasma helps clot blood.  Other blood products are available for specialized needs, such as hemophilia or other clotting disorders. BEFORE THE TRANSFUSION  Who gives blood for transfusions?   Healthy volunteers who are fully evaluated to make sure their blood is safe. This is blood bank blood. Transfusion therapy is the safest it has ever been in the practice of medicine. Before blood is taken from a donor, a complete history is taken to make sure that person has no history of diseases nor engages in risky social behavior (examples are intravenous drug use or sexual activity with multiple partners). The donor's travel history is screened to minimize risk of transmitting infections, such as malaria. The donated blood is tested for signs of infectious diseases, such as HIV and hepatitis. The blood is then tested to be sure it is compatible with you in order to minimize the chance of a transfusion reaction. If you or a relative donates blood, this is often done in anticipation of surgery and is not appropriate for emergency situations. It takes many days to process the donated blood. RISKS AND COMPLICATIONS Although transfusion therapy is very safe and saves many lives, the main dangers of transfusion include:  1. Getting an infectious disease. 2. Developing a transfusion reaction. This is an allergic reaction to something in the blood you were given. Every precaution is taken to prevent this. The decision  to have a blood transfusion has been considered carefully by your caregiver before blood is given. Blood is not given unless the benefits outweigh the risks. AFTER THE TRANSFUSION  Right after receiving a blood transfusion, you will usually feel much better and more energetic.  This is especially true if your red blood cells have gotten low (anemic). The transfusion raises the level of the red blood cells which carry oxygen, and this usually causes an energy increase.  The nurse administering the transfusion will monitor you carefully for complications. HOME CARE INSTRUCTIONS  No special instructions are needed after a transfusion. You may find your energy is better. Speak with your caregiver about any limitations on activity for underlying diseases you may have. SEEK MEDICAL CARE IF:   Your condition is not improving after your transfusion.  You develop redness or irritation at the intravenous (IV) site. SEEK IMMEDIATE MEDICAL CARE IF:  Any of the following symptoms occur over the next 12 hours:  Shaking chills.  You have a temperature by mouth above 102 F (38.9 C), not controlled by medicine.  Chest, back, or muscle pain.  People around you feel you are not acting correctly or are confused.  Shortness of breath or difficulty breathing.  Dizziness and fainting.  You get a rash or develop hives.  You have a decrease in urine output.  Your urine turns a dark color or changes to pink, red, or brown. Any of the following symptoms occur over the next 10 days:  You have a temperature by mouth above 102 F (38.9 C), not controlled by medicine.  Shortness of breath.  Weakness after normal activity.  The white part of the eye turns yellow (jaundice).  You have a decrease in the amount of urine or are urinating less often.  Your urine turns a dark color or changes to pink, red, or brown. Document Released: 08/08/2000 Document Revised: 11/03/2011 Document Reviewed: 03/27/2008 ExitCare Patient Information 2014 [place].  _______________________________________________________________________  Incentive Spirometer  An incentive spirometer is a tool that can help keep your lungs clear and active. This tool measures how well you are filling  your lungs with each breath. Taking long deep breaths may help reverse or decrease the chance of developing breathing (pulmonary) problems (especially infection) following:  A long period of time when you are unable to move or be active. BEFORE THE PROCEDURE   If the spirometer includes an indicator to show your best effort, your nurse or respiratory therapist will set it to a desired goal.  If possible, sit up straight or lean slightly forward. Try not to slouch.  Hold the incentive spirometer in an upright position. INSTRUCTIONS FOR USE  3. Sit on the edge of your bed if possible, or sit up as far as you can in bed or on a chair. 4. Hold the incentive spirometer in an upright position. 5. Breathe out normally. 6. Place the mouthpiece in your mouth and seal your lips tightly around it. 7. Breathe in slowly and as deeply as possible, raising the piston or the ball toward the top of the column. 8. Hold your breath for 3-5 seconds or for as long as possible. Allow the piston or ball to fall to the bottom of the column. 9. Remove the mouthpiece from your mouth and breathe out normally. 10. Rest for a few seconds and repeat Steps 1 through 7 at least 10 times every 1-2 hours when you are awake. Take your time and take a few normal  breaths between deep breaths. 11. The spirometer may include an indicator to show your best effort. Use the indicator as a goal to work toward during each repetition. 12. After each set of 10 deep breaths, practice coughing to be sure your lungs are clear. If you have an incision (the cut made at the time of surgery), support your incision when coughing by placing a pillow or rolled up towels firmly against it. Once you are able to get out of bed, walk around indoors and cough well. You may stop using the incentive spirometer when instructed by your caregiver.  RISKS AND COMPLICATIONS  Take your time so you do not get dizzy or light-headed.  If you are in pain, you  may need to take or ask for pain medication before doing incentive spirometry. It is harder to take a deep breath if you are having pain. AFTER USE  Rest and breathe slowly and easily.  It can be helpful to keep track of a log of your progress. Your caregiver can provide you with a simple table to help with this. If you are using the spirometer at home, follow these instructions: [place] IF:   You are having difficultly using the spirometer.  You have trouble using the spirometer as often as instructed.  Your pain medication is not giving enough relief while using the spirometer.  You develop fever of 100.5 F (38.1 C) or higher. SEEK IMMEDIATE MEDICAL CARE IF:   You cough up bloody sputum that had not been present before.  You develop fever of 102 F (38.9 C) or greater.  You develop worsening pain at or near the incision site. MAKE SURE YOU:   Understand these instructions.  Will watch your condition.  Will get help right away if you are not doing well or get worse. Document Released: 12/22/2006 Document Revised: 11/03/2011 Document Reviewed: 02/22/2007 [facility] Patient Information 2014 [place], [place].   ________________________________________________________________________

## 2015-01-30 ENCOUNTER — Telehealth: Payer: Self-pay

## 2015-01-30 ENCOUNTER — Encounter (HOSPITAL_COMMUNITY): Payer: Self-pay

## 2015-01-30 ENCOUNTER — Encounter (HOSPITAL_COMMUNITY)
Admission: RE | Admit: 2015-01-30 | Discharge: 2015-01-30 | Disposition: A | Discharge status: Home / Self Care | Payer: Medicare Other | Source: Ambulatory Visit | Attending: Urology | Admitting: Urology

## 2015-01-30 DIAGNOSIS — R0602 Shortness of breath: Secondary | ICD-10-CM

## 2015-01-30 DIAGNOSIS — Z0183 Encounter for blood typing: Secondary | ICD-10-CM | POA: Diagnosis not present

## 2015-01-30 DIAGNOSIS — N2889 Other specified disorders of kidney and ureter: Secondary | ICD-10-CM | POA: Diagnosis not present

## 2015-01-30 DIAGNOSIS — Z01812 Encounter for preprocedural laboratory examination: Secondary | ICD-10-CM | POA: Diagnosis not present

## 2015-01-30 DIAGNOSIS — Z01818 Encounter for other preprocedural examination: Secondary | ICD-10-CM | POA: Diagnosis present

## 2015-01-30 HISTORY — DX: Personal history of antineoplastic chemotherapy: Z92.21

## 2015-01-30 LAB — BASIC METABOLIC PANEL
Anion gap: 8 (ref 5–15)
BUN: 20 mg/dL (ref 6–20)
CO2: 23 mmol/L (ref 22–32)
CREATININE: 1.19 mg/dL — AB (ref 0.44–1.00)
Calcium: 9.5 mg/dL (ref 8.9–10.3)
Chloride: 111 mmol/L (ref 101–111)
GFR, EST AFRICAN AMERICAN: 53 mL/min — AB (ref >60)
GFR, EST NON AFRICAN AMERICAN: 45 mL/min — AB (ref >60)
Glucose, Bld: 92 mg/dL (ref 65–99)
Potassium: 4.8 mmol/L (ref 3.5–5.1)
Sodium: 142 mmol/L (ref 135–145)

## 2015-01-30 LAB — CBC
HCT: 34.4 % — ABNORMAL LOW (ref 36.0–46.0)
Hemoglobin: 10.6 g/dL — ABNORMAL LOW (ref 12.0–15.0)
MCH: 26.5 pg (ref 26.0–34.0)
MCHC: 30.8 g/dL (ref 30.0–36.0)
MCV: 86 fL (ref 78.0–100.0)
Platelets: 197 10*3/uL (ref 150–400)
RBC: 4 MIL/uL (ref 3.87–5.11)
RDW: 14.8 % (ref 11.5–15.5)
WBC: 21.5 10*3/uL — AB (ref 4.0–10.5)

## 2015-01-30 LAB — ABO/RH: ABO/RH(D): O POS

## 2015-01-30 NOTE — Progress Notes (Signed)
CBC amd BMP results done 01/30/2015 faxed via EPIC to Dr Alinda Money.

## 2015-01-30 NOTE — Progress Notes (Addendum)
EKG- 09/20/14 EPIC  ECHO- 09/07/14 EPIC  09/20/14 LOV with Dr Ron Parker in [place]- 01/04/2015

## 2015-01-30 NOTE — Progress Notes (Signed)
Patient reports Shortness of breath at time of preop appointment.  Last CXR done 01/04/2015 in EPIC.  Patient reports no change in respiratory status since 01/04/2015.  Patient reports no change or worsening in shortness of breath since 01/04/2015.

## 2015-01-30 NOTE — Progress Notes (Signed)
Called and spoke with Triage regarding labs of CBC and BMP done 01/30/2015.  CBC adn BMP results done 01/30/2015 have been faxed via EPIC to Dr Raynelle Bring.  Spoke with Shirlean Mylar in Triage and she stated the CBC and BMP results done 01/30/2015 have been faxed into patient's chart at office and Dr Alinda Money is not in today but he will see lab results on 01/31/2015 when he returns to office.  Also made her aware patient hx of chronic lymphocytic leukemia and white count results since 08/2014 have been elevated.  Shirlean Mylar stated they would make sure Dr Alinda Money received lab results.

## 2015-01-30 NOTE — Telephone Encounter (Signed)
PA needed for dexilant. Pt has tried/failed omeprazole and pepcid previously. Completed on covermymeds. Pending.

## 2015-02-01 ENCOUNTER — Ambulatory Visit (HOSPITAL_COMMUNITY)
Admission: RE | Admit: 2015-02-01 | Discharge: 2015-02-01 | Disposition: A | Discharge status: Home / Self Care | Payer: Medicare Other | Source: Ambulatory Visit | Attending: Urology | Admitting: Urology

## 2015-02-01 ENCOUNTER — Other Ambulatory Visit (HOSPITAL_COMMUNITY): Payer: Self-pay | Admitting: Urology

## 2015-02-01 DIAGNOSIS — Z01818 Encounter for other preprocedural examination: Secondary | ICD-10-CM | POA: Diagnosis not present

## 2015-02-01 DIAGNOSIS — Z0183 Encounter for blood typing: Secondary | ICD-10-CM | POA: Insufficient documentation

## 2015-02-01 DIAGNOSIS — R0602 Shortness of breath: Secondary | ICD-10-CM

## 2015-02-01 DIAGNOSIS — Z01812 Encounter for preprocedural laboratory examination: Secondary | ICD-10-CM | POA: Insufficient documentation

## 2015-02-01 DIAGNOSIS — N2889 Other specified disorders of kidney and ureter: Secondary | ICD-10-CM | POA: Insufficient documentation

## 2015-02-02 NOTE — H&P (Signed)
Chief Complaint Right renal mass   Reason For Visit Reason for consult: To discuss treatment options for her right renal mass and to consider nephron sparing minimally invasive surgery.  Physician requesting consult: Dr. Lowella Bandy  PCP: Dr. [name]   History of Present Illness [name] [name] is a 69 year old lady with a past medical history significant for breast cancer. She was initially treated in 1997 with lumpectomy and radiation. She had a recurrence in the same breast in December 2014 and underwent radical mastectomy and adjuvant chemotherapy. She is currently receiving anastrazole. She also has a history of kidney stones, CLL, fibromyalgia, depression, arthritis, anxiety, and GERD. She was incidentally noted to have a hyperdense right renal mass on CT in April 2016 that was performed for right abdominal pain. She described her pain is intermittent and located in her right upper quadrant. An ultrasound demonstrated no evidence of cholelithiasis. A dedicated MRI of the kidneys with and without contrast on 12/27/14 confirmed an enhancing 1.3 cm partially exophytic renal mass within the lateral interpolar right kidney concerning for renal cell carcinoma. Non-pathologically enlarged para-aortic and porta hepatis lymph nodes were noted. No worrisome contralateral renal masses, adrenal masses, renal vein or IVC invasion, or other signs of metastatic disease were noted. She did have a CT scan without contrast in October 2014 and no appreciable exophytic renal lesion can be identified. Another unenhanced CT scan in August 2015 didn't demonstrate the lesion although it is not well characterized on this unenhanced scan. This would suggest that this mass has appeared since October 2014.    She denies any hematuria. Her abdominal pain is still intermittently present although not severe. She currently has bronchitis and is undergoing treatment for her primary care physician.     Past Medical  History Problems  1. History of Anxiety (Symptom) 2. History of Arthritis 3. History of Asthma (J45.909) 4. History of Breast Cancer 5. History of Fibromyalgia (M79.7) 6. History of depression (Z86.59) 7. History of esophageal reflux (Z87.19)  Surgical History Problems  1. History of Breast Surgery 2. History of Corneal LASIK Bilateral 3. History of Cystoscopy With Insertion Of Ureteral Stent Bilateral 4. History of Cystoscopy With Ureteroscopy With Removal Of Calculus 5. History of Hysterectomy 6. History of Lithotripsy 7. History of Lymphadenectomy  Current Meds 1. ALPRAZolam 0.5 MG Oral Tablet;  Therapy: (Recorded:23Oct2014) to Recorded 2. Amitriptyline HCl TABS;  Therapy: (Recorded:23Oct2014) to Recorded 3. Anastrozole 1 MG Oral Tablet;  Therapy: (Recorded:09May2016) to Recorded 4. Cetirizine HCl - 10 MG Oral Tablet;  Therapy: (Recorded:09May2016) to Recorded 5. Clarithromycin 500 MG Oral Tablet;  Therapy: (Recorded:17May2016) to Recorded 6. Fish Oil CAPS;  Therapy: (Recorded:09May2016) to Recorded 7. Ibuprofen TABS;  Therapy: (Recorded:09May2016) to Recorded 8. Levothyroxine Sodium 50 MCG Oral Tablet;  Therapy: (Recorded:23Oct2014) to Recorded 9. Metoprolol Tartrate 25 MG Oral Tablet;  Therapy: (Recorded:23Oct2014) to Recorded 10. Omeprazole 40 MG Oral Capsule Delayed Release;   Therapy: (Recorded:09May2016) to Recorded 11. ProAir HFA 108 (90 Base) MCG/ACT Inhalation Aerosol Solution;   Therapy: (Recorded:17May2016) to Recorded 12. Rogaine 2 % External Solution;   Therapy: (Recorded:09May2016) to Recorded  Allergies Medication  1. Amoxicillin TABS 2. Lasix TABS 3. Lyrica CAPS 4. Sulfa Drugs 5. Trazodone and Nefazodone  Family History Problems  1. Family history of Prostate Cancer : Father 2. Family history of Urologic Disorder : Mother   kidney stones  Social History Problems    Family history of Death In The Family Father   35   Never  A  Smoker   Denied: Tobacco Use  Review of Systems Genitourinary, constitutional, skin, eye, otolaryngeal, hematologic/lymphatic, cardiovascular, pulmonary, endocrine, musculoskeletal, gastrointestinal, neurological and psychiatric system(s) were reviewed and pertinent findings if present are noted and are otherwise negative.    Vitals Vital Signs [Data Includes: Last 1 Day]  Recorded: [id number] 02:06PM  Height: 5 ft 4.25 in Weight: 192 lb  BMI Calculated: 32.7 BSA Calculated: 1.93 Blood Pressure: 150 / 80 Temperature: 98.6 F  Physical Exam Constitutional: Well nourished and well developed . No acute distress.  ENT:. The ears and nose are normal in appearance.  Neck: The appearance of the neck is normal and no neck mass is present.  Pulmonary: No respiratory distress, normal respiratory rhythm and effort and clear bilateral breath sounds.  Cardiovascular: Heart rate and rhythm are normal . No peripheral edema.  Abdomen: The abdomen is mildly obese. The abdomen is soft and nontender. No masses are palpated. No CVA tenderness. No hernias are palpable. No hepatosplenomegaly noted.  Lymphatics: The femoral and inguinal nodes are not enlarged or tender.  Skin: Normal skin turgor, no visible rash and no visible skin lesions.  Neuro/Psych:. Mood and affect are appropriate.    Results/Data Urine [Data Includes: Last 1 Day]   [id number]  COLOR YELLOW   APPEARANCE CLEAR   SPECIFIC GRAVITY <1.005   pH 6.0   GLUCOSE NEG mg/dL  BILIRUBIN NEG   KETONE NEG mg/dL  BLOOD NEG   PROTEIN NEG mg/dL  UROBILINOGEN 0.2 mg/dL  NITRITE NEG   LEUKOCYTE ESTERASE NEG    I independently reviewed her medical records and her imaging studies as stated above. I also reviewed a recent chest x-ray which did not demonstrate evidence for metastatic disease.   Assessment Assessed  1. Renal mass (N28.89)  Plan Health Maintenance  1. UA With REFLEX; [Do Not Release]; Status:Complete;   Done: [id number]  01:58PM Renal mass  2. Follow-up Office  Follow-up - will call to schedule surgery  Status: Hold For - Date of  Service  Requested for: [id number] 3. CMP with Estimated GFR; Status:In Progress - Specimen/Data Collected;   Done:  [id number] 4. VENIPUNCTURE; Status:Complete;   Done: [id number]  Discussion/Summary 1. Small right renal mass concerning for renal malignancy: I had a detailed discussion with [name] [name] and her husband today regarding her finding of an enhancing small right renal mass. Statistically, this represents a renal cell carcinoma although we did discuss and up to 25-30% chance this could represent a benign lesion. Although she does have a history of breast cancer, this lesion would appear highly unlikely to representing metastatic site.   The patient was provided information regarding their renal mass including the relative risk of benign versus malignant pathology and the natural history of renal cell carcinoma and other possible malignancies of the kidney. The role of renal biopsy, laboratory testing, and imaging studies to further characterize renal masses and/or the presence of metastatic disease were explained. We discussed the role of active surveillance, surgical therapy with both radical nephrectomy and nephron-sparing surgery, and ablative therapy in the treatment of renal masses. In addition, we discussed our goals of providing an accurate diagnosis and oncologic control while maintaining optimal renal function as appropriate based on the size, location, and complexity of their renal mass as well as their co-morbidities.    We have discussed the risks of treatment in detail including but not limited to bleeding, infection, heart attack, stroke, death, venothromoboembolism, cancer recurrence, injury/damage to surrounding organs and structures, urine  leak, the possibility of open surgical conversion for patients undergoing minimally invasive surgery, the risk of developing  chronic kidney disease and its associated implications, and the potential risk of end stage renal disease possibly necessitating dialysis. After reviewing options, she is not interested in a surveillance approach. She also is uninterested in a biopsy understanding the potential for a nondiagnostic or false-negative result. She does wish to proceed with treatment and we did discuss the pros and cons of percutaneous ablation versus minimally invasive removal of her renal mass in a nephron sparing approach. After further discussion, she does wish to proceed with a right robot-assisted laparoscopic partial nephrectomy. She will be given a few weeks to recover from her current bronchitis and we'll plan to proceed with surgery in June. She will notify me if her pulmonary symptoms. Her examination and her chest x-ray at this time appear unremarkable for pulmonary infection.      Verified Results CMP with Estimated GFR1 [id number] 03:02PM1 Read Drivers  SPECIMEN TYPE: BLOOD  [Jan 10, 2015 6:06PM Sheyanne Munley] Please notify patient that her kidney function was normal. However, her liver function was elevated. I would recommend that she had sustained from any Tylenol products and please have her get a repeat laterally visit for liver function tests next week. Thank you.   Test Name Result Flag Reference  SODIUM1 137 mEq/L1  [phone]  POTASSIUM1 4.2 mEq/L1  3.5-5.31  CHLORIDE1 103 mEq/L1  96-1121  CO21 22 mEq/L1  19-321  GLUCOSE1 95 mg/dL1  70-991  BUN1 15 mg/dL1  6-231  CREATININE1 1.01 mg/dL1  0.50-1.401  BILIRUBIN, TOTAL1 0.4 mg/dL1  0.2-1.21  ALKALINE PHOSPHATASE1 77 U/L1  39-1171  AST/SGOT1 72 U/L1 H1 0-371  ALT/SGPT1 89 U/L1 H1 0-351  TOTAL PROTEIN1 6.6 g/dL1  6.0-8.31  ALBUMIN1 4.2 g/dL1  3.5-5.21  CALCIUM1 9.6 mg/dL1  8.4-10.51  Est GFR, African American1 66 mL/min1    Est GFR, NonAfrican American1 57 mL/min1 L1   THE ESTIMATED GFR IS A CALCULATION VALID FOR ADULTS (>=32 YEARS OLD) THAT  USES THE CKD-EPI ALGORITHM TO ADJUST FOR AGE AND SEX. IT IS   NOT TO BE USED FOR CHILDREN, PREGNANT WOMEN, HOSPITALIZED PATIENTS,    PATIENTS ON DIALYSIS, OR WITH RAPIDLY CHANGING KIDNEY FUNCTION. ACCORDING TO THE NKDEP, EGFR >89 IS NORMAL, 60-89 SHOWS MILD IMPAIRMENT, 30-59 SHOWS MODERATE IMPAIRMENT, 15-29 SHOWS SEVERE IMPAIRMENT AND <15 IS ESRD.     1. Amended By: Raynelle Bring; Jan 10 2015 6:06 PM EST  SignaturesElectronically signed by : Raynelle Bring, M.D.; Jan 10 2015  6:06PM EST

## 2015-02-05 ENCOUNTER — Inpatient Hospital Stay (HOSPITAL_COMMUNITY)
Admission: RE | Admit: 2015-02-05 | Discharge: 2015-02-08 | DRG: 657 | Disposition: A | Discharge status: Home / Self Care | Payer: Medicare Other | Source: Ambulatory Visit | Attending: Urology | Admitting: Urology

## 2015-02-05 ENCOUNTER — Inpatient Hospital Stay (HOSPITAL_COMMUNITY): Payer: Medicare Other | Admitting: Certified Registered Nurse Anesthetist

## 2015-02-05 ENCOUNTER — Encounter (HOSPITAL_COMMUNITY): Payer: Self-pay | Admitting: *Deleted

## 2015-02-05 ENCOUNTER — Encounter (HOSPITAL_COMMUNITY)
Admission: RE | Disposition: A | Discharge status: Home / Self Care | Payer: Self-pay | Source: Ambulatory Visit | Attending: Urology

## 2015-02-05 DIAGNOSIS — Z881 Allergy status to other antibiotic agents status: Secondary | ICD-10-CM

## 2015-02-05 DIAGNOSIS — N2889 Other specified disorders of kidney and ureter: Secondary | ICD-10-CM | POA: Diagnosis present

## 2015-02-05 DIAGNOSIS — J45909 Unspecified asthma, uncomplicated: Secondary | ICD-10-CM | POA: Diagnosis present

## 2015-02-05 DIAGNOSIS — Z90710 Acquired absence of both cervix and uterus: Secondary | ICD-10-CM

## 2015-02-05 DIAGNOSIS — F329 Major depressive disorder, single episode, unspecified: Secondary | ICD-10-CM | POA: Diagnosis present

## 2015-02-05 DIAGNOSIS — M1990 Unspecified osteoarthritis, unspecified site: Secondary | ICD-10-CM | POA: Diagnosis present

## 2015-02-05 DIAGNOSIS — J40 Bronchitis, not specified as acute or chronic: Secondary | ICD-10-CM | POA: Diagnosis present

## 2015-02-05 DIAGNOSIS — N280 Ischemia and infarction of kidney: Secondary | ICD-10-CM | POA: Diagnosis present

## 2015-02-05 DIAGNOSIS — Z87442 Personal history of urinary calculi: Secondary | ICD-10-CM | POA: Diagnosis not present

## 2015-02-05 DIAGNOSIS — Z853 Personal history of malignant neoplasm of breast: Secondary | ICD-10-CM | POA: Diagnosis not present

## 2015-02-05 DIAGNOSIS — M797 Fibromyalgia: Secondary | ICD-10-CM | POA: Diagnosis present

## 2015-02-05 DIAGNOSIS — F419 Anxiety disorder, unspecified: Secondary | ICD-10-CM | POA: Diagnosis present

## 2015-02-05 DIAGNOSIS — D495 Neoplasm of unspecified behavior of other genitourinary organs: Secondary | ICD-10-CM | POA: Diagnosis present

## 2015-02-05 DIAGNOSIS — Z882 Allergy status to sulfonamides status: Secondary | ICD-10-CM | POA: Diagnosis not present

## 2015-02-05 HISTORY — PX: ROBOTIC ASSITED PARTIAL NEPHRECTOMY: SHX6087

## 2015-02-05 LAB — BASIC METABOLIC PANEL
Anion gap: 7 (ref 5–15)
BUN: 11 mg/dL (ref 6–20)
CO2: 26 mmol/L (ref 22–32)
Calcium: 8.7 mg/dL — ABNORMAL LOW (ref 8.9–10.3)
Chloride: 107 mmol/L (ref 101–111)
Creatinine, Ser: 1.08 mg/dL — ABNORMAL HIGH (ref 0.44–1.00)
GFR calc Af Amer: 59 mL/min — ABNORMAL LOW (ref >60)
GFR calc non Af Amer: 51 mL/min — ABNORMAL LOW (ref >60)
GLUCOSE: 134 mg/dL — AB (ref 65–99)
POTASSIUM: 4.1 mmol/L (ref 3.5–5.1)
SODIUM: 140 mmol/L (ref 135–145)

## 2015-02-05 LAB — TYPE AND SCREEN
ABO/RH(D): O POS
ANTIBODY SCREEN: NEGATIVE

## 2015-02-05 LAB — HEMOGLOBIN AND HEMATOCRIT, BLOOD
HCT: 32.4 % — ABNORMAL LOW (ref 36.0–46.0)
Hemoglobin: 10.2 g/dL — ABNORMAL LOW (ref 12.0–15.0)

## 2015-02-05 SURGERY — ROBOTIC ASSITED PARTIAL NEPHRECTOMY
Anesthesia: General | Laterality: Right

## 2015-02-05 MED ORDER — NEOSTIGMINE METHYLSULFATE 10 MG/10ML IV SOLN
INTRAVENOUS | Status: AC
  Filled 2015-02-05: qty 1

## 2015-02-05 MED ORDER — ONDANSETRON HCL 4 MG/2ML IJ SOLN: 4.0000 mg | INTRAMUSCULAR | Status: DC | PRN

## 2015-02-05 MED ORDER — CISATRACURIUM BESYLATE 20 MG/10ML IV SOLN
INTRAVENOUS | Status: AC
  Filled 2015-02-05: qty 10

## 2015-02-05 MED ORDER — AMITRIPTYLINE HCL 25 MG PO TABS
150.0000 mg | ORAL_TABLET | Freq: Every day | ORAL | Status: DC
  Administered 2015-02-05 – 2015-02-07 (×3): 150 mg via ORAL
  Filled 2015-02-05 (×3): qty 6

## 2015-02-05 MED ORDER — ALBUTEROL SULFATE (2.5 MG/3ML) 0.083% IN NEBU: 3.0000 mL | INHALATION_SOLUTION | RESPIRATORY_TRACT | Status: DC | PRN

## 2015-02-05 MED ORDER — HYDROMORPHONE HCL 1 MG/ML IJ SOLN
INTRAMUSCULAR | Status: AC
  Filled 2015-02-05: qty 1

## 2015-02-05 MED ORDER — METOPROLOL TARTRATE 1 MG/ML IV SOLN
INTRAVENOUS | Status: AC
  Filled 2015-02-05: qty 5

## 2015-02-05 MED ORDER — PROMETHAZINE HCL 25 MG/ML IJ SOLN
6.2500 mg | INTRAMUSCULAR | Status: DC | PRN
  Administered 2015-02-05: 6.25 mg via INTRAVENOUS

## 2015-02-05 MED ORDER — DOCUSATE SODIUM 100 MG PO CAPS
100.0000 mg | ORAL_CAPSULE | Freq: Two times a day (BID) | ORAL | Status: DC
  Administered 2015-02-05 – 2015-02-08 (×6): 100 mg via ORAL
  Filled 2015-02-05 (×6): qty 1

## 2015-02-05 MED ORDER — MIDAZOLAM HCL 2 MG/2ML IJ SOLN
INTRAMUSCULAR | Status: AC
  Filled 2015-02-05: qty 2

## 2015-02-05 MED ORDER — VANCOMYCIN HCL 1000 MG IV SOLR: 1000.0000 mg | INTRAVENOUS | Status: DC | PRN

## 2015-02-05 MED ORDER — HYDROCODONE-ACETAMINOPHEN 5-325 MG PO TABS: 1.0000 | ORAL_TABLET | Freq: Four times a day (QID) | ORAL | Status: DC | PRN

## 2015-02-05 MED ORDER — BREXPIPRAZOLE 0.5 MG PO TABS
0.5000 mg | ORAL_TABLET | Freq: Every day | ORAL | Status: DC
  Administered 2015-02-05 – 2015-02-07 (×3): 0.5 mg via ORAL

## 2015-02-05 MED ORDER — ANASTROZOLE 1 MG PO TABS
1.0000 mg | ORAL_TABLET | Freq: Every day | ORAL | Status: DC
  Administered 2015-02-05 – 2015-02-08 (×4): 1 mg via ORAL
  Filled 2015-02-05 (×4): qty 1

## 2015-02-05 MED ORDER — PANTOPRAZOLE SODIUM 40 MG PO TBEC
40.0000 mg | DELAYED_RELEASE_TABLET | Freq: Every day | ORAL | Status: DC
  Administered 2015-02-06 – 2015-02-08 (×3): 40 mg via ORAL
  Filled 2015-02-05 (×3): qty 1

## 2015-02-05 MED ORDER — LIDOCAINE HCL (CARDIAC) 20 MG/ML IV SOLN
INTRAVENOUS | Status: AC
  Filled 2015-02-05: qty 5

## 2015-02-05 MED ORDER — PROMETHAZINE HCL 25 MG/ML IJ SOLN
INTRAMUSCULAR | Status: AC
Start: 2015-02-05 — End: 2015-02-06
  Filled 2015-02-05: qty 1

## 2015-02-05 MED ORDER — LACTATED RINGERS IV SOLN
INTRAVENOUS | Status: DC
  Administered 2015-02-05 (×2): via INTRAVENOUS

## 2015-02-05 MED ORDER — DEXTROSE-NACL 5-0.45 % IV SOLN
INTRAVENOUS | Status: DC
  Administered 2015-02-05 – 2015-02-06 (×4): via INTRAVENOUS

## 2015-02-05 MED ORDER — LIDOCAINE HCL (CARDIAC) 20 MG/ML IV SOLN
INTRAVENOUS | Status: DC | PRN
  Administered 2015-02-05: 100 mg via INTRAVENOUS

## 2015-02-05 MED ORDER — PROPOFOL 10 MG/ML IV BOLUS
INTRAVENOUS | Status: DC | PRN
  Administered 2015-02-05: 150 mg via INTRAVENOUS

## 2015-02-05 MED ORDER — ACETAMINOPHEN 10 MG/ML IV SOLN
1000.0000 mg | Freq: Four times a day (QID) | INTRAVENOUS | Status: DC
  Administered 2015-02-05 – 2015-02-06 (×3): 1000 mg via INTRAVENOUS
  Filled 2015-02-05 (×3): qty 100

## 2015-02-05 MED ORDER — LACTATED RINGERS IR SOLN
Status: DC | PRN
  Administered 2015-02-05: 1000

## 2015-02-05 MED ORDER — CEFAZOLIN SODIUM 1-5 GM-% IV SOLN
1.0000 g | Freq: Three times a day (TID) | INTRAVENOUS | Status: AC
  Administered 2015-02-05 – 2015-02-06 (×2): 1 g via INTRAVENOUS
  Filled 2015-02-05 (×2): qty 50

## 2015-02-05 MED ORDER — MORPHINE SULFATE 2 MG/ML IJ SOLN
2.0000 mg | INTRAMUSCULAR | Status: DC | PRN
  Administered 2015-02-05 – 2015-02-06 (×5): 4 mg via INTRAVENOUS
  Filled 2015-02-05 (×5): qty 2

## 2015-02-05 MED ORDER — VILAZODONE HCL 20 MG PO TABS
40.0000 mg | ORAL_TABLET | Freq: Two times a day (BID) | ORAL | Status: DC
  Administered 2015-02-05 – 2015-02-08 (×6): 40 mg via ORAL
  Filled 2015-02-05 (×7): qty 2

## 2015-02-05 MED ORDER — SODIUM CHLORIDE 0.9 % IJ SOLN
INTRAMUSCULAR | Status: AC
  Filled 2015-02-05: qty 20

## 2015-02-05 MED ORDER — LIP MEDEX EX OINT
TOPICAL_OINTMENT | CUTANEOUS | Status: AC
  Filled 2015-02-05: qty 7

## 2015-02-05 MED ORDER — FENTANYL CITRATE (PF) 100 MCG/2ML IJ SOLN
INTRAMUSCULAR | Status: AC
  Filled 2015-02-05: qty 2

## 2015-02-05 MED ORDER — METOPROLOL SUCCINATE ER 25 MG PO TB24
25.0000 mg | ORAL_TABLET | Freq: Every day | ORAL | Status: DC
  Administered 2015-02-05 – 2015-02-08 (×4): 25 mg via ORAL
  Filled 2015-02-05 (×4): qty 1

## 2015-02-05 MED ORDER — GLYCOPYRROLATE 0.2 MG/ML IJ SOLN
INTRAMUSCULAR | Status: AC
  Filled 2015-02-05: qty 3

## 2015-02-05 MED ORDER — FENTANYL CITRATE (PF) 250 MCG/5ML IJ SOLN
INTRAMUSCULAR | Status: AC
  Filled 2015-02-05: qty 5

## 2015-02-05 MED ORDER — DIPHENHYDRAMINE HCL 12.5 MG/5ML PO ELIX
12.5000 mg | ORAL_SOLUTION | Freq: Four times a day (QID) | ORAL | Status: DC | PRN
  Administered 2015-02-05: 12.5 mg via ORAL
  Administered 2015-02-06: 25 mg via ORAL
  Filled 2015-02-05: qty 5
  Filled 2015-02-05: qty 10

## 2015-02-05 MED ORDER — GLYCOPYRROLATE 0.2 MG/ML IJ SOLN
INTRAMUSCULAR | Status: DC | PRN
  Administered 2015-02-05: .6 mg via INTRAVENOUS

## 2015-02-05 MED ORDER — LIDOCAINE HCL 1 % IJ SOLN
INTRAMUSCULAR | Status: AC
  Filled 2015-02-05: qty 20

## 2015-02-05 MED ORDER — METOPROLOL TARTRATE 1 MG/ML IV SOLN
INTRAVENOUS | Status: DC | PRN
  Administered 2015-02-05: 2 mg via INTRAVENOUS

## 2015-02-05 MED ORDER — BUPIVACAINE LIPOSOME 1.3 % IJ SUSP
20.0000 mL | Freq: Once | INTRAMUSCULAR | Status: AC
  Administered 2015-02-05: 20 mL
  Filled 2015-02-05: qty 20

## 2015-02-05 MED ORDER — HYDROMORPHONE HCL 1 MG/ML IJ SOLN
0.2500 mg | INTRAMUSCULAR | Status: DC | PRN
  Administered 2015-02-05: 0.25 mg via INTRAVENOUS
  Administered 2015-02-05 (×2): 0.5 mg via INTRAVENOUS
  Administered 2015-02-05: 0.25 mg via INTRAVENOUS
  Administered 2015-02-05: 0.5 mg via INTRAVENOUS

## 2015-02-05 MED ORDER — STERILE WATER FOR IRRIGATION IR SOLN
Status: DC | PRN
  Administered 2015-02-05: 3000 mL

## 2015-02-05 MED ORDER — FENTANYL CITRATE (PF) 100 MCG/2ML IJ SOLN
INTRAMUSCULAR | Status: DC | PRN
  Administered 2015-02-05: 100 ug via INTRAVENOUS
  Administered 2015-02-05 (×2): 50 ug via INTRAVENOUS

## 2015-02-05 MED ORDER — ACETAMINOPHEN 10 MG/ML IV SOLN
INTRAVENOUS | Status: AC
Start: 2015-02-05 — End: 2015-02-06
  Filled 2015-02-05: qty 100

## 2015-02-05 MED ORDER — ALPRAZOLAM 0.5 MG PO TABS
0.5000 mg | ORAL_TABLET | Freq: Three times a day (TID) | ORAL | Status: DC | PRN
  Administered 2015-02-05 – 2015-02-06 (×2): 0.5 mg via ORAL
  Filled 2015-02-05 (×2): qty 1

## 2015-02-05 MED ORDER — MANNITOL 25 % IV SOLN
25.0000 g | Freq: Once | INTRAVENOUS | Status: AC
  Administered 2015-02-05 (×2): 12.5 g via INTRAVENOUS
  Filled 2015-02-05: qty 100

## 2015-02-05 MED ORDER — CISATRACURIUM BESYLATE (PF) 10 MG/5ML IV SOLN
INTRAVENOUS | Status: DC | PRN
  Administered 2015-02-05 (×4): 2 mg via INTRAVENOUS

## 2015-02-05 MED ORDER — FENTANYL CITRATE (PF) 100 MCG/2ML IJ SOLN
25.0000 ug | INTRAMUSCULAR | Status: DC | PRN
  Administered 2015-02-05: 25 ug via INTRAVENOUS

## 2015-02-05 MED ORDER — PROPOFOL 10 MG/ML IV BOLUS
INTRAVENOUS | Status: AC
  Filled 2015-02-05: qty 20

## 2015-02-05 MED ORDER — SUCCINYLCHOLINE CHLORIDE 20 MG/ML IJ SOLN
INTRAMUSCULAR | Status: DC | PRN
  Administered 2015-02-05: 100 mg via INTRAVENOUS

## 2015-02-05 MED ORDER — NEOSTIGMINE METHYLSULFATE 10 MG/10ML IV SOLN
INTRAVENOUS | Status: DC | PRN
  Administered 2015-02-05: 4 mg via INTRAVENOUS

## 2015-02-05 MED ORDER — LEVOTHYROXINE SODIUM 25 MCG PO TABS
50.0000 ug | ORAL_TABLET | Freq: Every day | ORAL | Status: DC
  Administered 2015-02-06 – 2015-02-08 (×3): 50 ug via ORAL
  Filled 2015-02-05 (×3): qty 2

## 2015-02-05 MED ORDER — MIDAZOLAM HCL 5 MG/5ML IJ SOLN
INTRAMUSCULAR | Status: DC | PRN
  Administered 2015-02-05: 2 mg via INTRAVENOUS

## 2015-02-05 MED ORDER — VANCOMYCIN HCL IN DEXTROSE 1-5 GM/200ML-% IV SOLN
1000.0000 mg | INTRAVENOUS | Status: AC
  Administered 2015-02-05: 1000 mg via INTRAVENOUS
  Filled 2015-02-05: qty 200

## 2015-02-05 MED ORDER — ONDANSETRON HCL 4 MG/2ML IJ SOLN
INTRAMUSCULAR | Status: AC
  Filled 2015-02-05: qty 2

## 2015-02-05 MED ORDER — DIPHENHYDRAMINE HCL 50 MG/ML IJ SOLN: 12.5000 mg | Freq: Four times a day (QID) | INTRAMUSCULAR | Status: DC | PRN

## 2015-02-05 MED ORDER — PANTOPRAZOLE SODIUM 40 MG PO TBEC: 40.0000 mg | DELAYED_RELEASE_TABLET | Freq: Every day | ORAL | Status: DC

## 2015-02-05 MED FILL — Lactated Ringer's Solution: INTRAVENOUS | Qty: 1000 | Status: AC

## 2015-02-05 SURGICAL SUPPLY — 54 items
APL ESCP 34 STRL LF DISP (HEMOSTASIS) ×1
APPLICATOR SURGIFLO ENDO (HEMOSTASIS) ×1 IMPLANT
BAG SPEC RTRVL LRG 6X4 10 (ENDOMECHANICALS) ×1
CABLE HIGH FREQUENCY MONO STRZ (ELECTRODE) ×2 IMPLANT
CHLORAPREP W/TINT 26ML (MISCELLANEOUS) ×2 IMPLANT
CLIP LIGATING HEM O LOK PURPLE (MISCELLANEOUS) ×2 IMPLANT
CLIP LIGATING HEMO O LOK GREEN (MISCELLANEOUS) ×2 IMPLANT
CORDS BIPOLAR (ELECTRODE) ×2 IMPLANT
COVER BACK TABLE 60X90IN (DRAPES) ×2 IMPLANT
COVER SURGICAL LIGHT HANDLE (MISCELLANEOUS) ×2 IMPLANT
COVER TIP SHEARS 8 DVNC (MISCELLANEOUS) ×1 IMPLANT
COVER TIP SHEARS 8MM DA VINCI (MISCELLANEOUS) ×1
DECANTER SPIKE VIAL GLASS SM (MISCELLANEOUS) ×1 IMPLANT
DRAIN CHANNEL 15F RND FF 3/16 (WOUND CARE) ×2 IMPLANT
DRAPE INCISE IOBAN 66X45 STRL (DRAPES) ×2 IMPLANT
DRAPE LAPAROSCOPIC ABDOMINAL (DRAPES) ×2 IMPLANT
DRAPE SHEET LG 3/4 BI-LAMINATE (DRAPES) ×2 IMPLANT
DRAPE TABLE BACK 44X90 PK DISP (DRAPES) ×2 IMPLANT
DRAPE WARM FLUID 44X44 (DRAPE) ×2 IMPLANT
DRSG TEGADERM 4X4.75 (GAUZE/BANDAGES/DRESSINGS) ×1 IMPLANT
ELECT REM PT RETURN 9FT ADLT (ELECTROSURGICAL) ×2
ELECTRODE REM PT RTRN 9FT ADLT (ELECTROSURGICAL) ×1 IMPLANT
EVACUATOR SILICONE 100CC (DRAIN) ×2 IMPLANT
GLOVE BIO SURGEON STRL SZ 6.5 (GLOVE) ×2 IMPLANT
GLOVE BIOGEL M STRL SZ7.5 (GLOVE) ×4 IMPLANT
GOWN STRL REUS W/TWL LRG LVL3 (GOWN DISPOSABLE) ×9 IMPLANT
HEMOSTAT SURGICEL 4X8 (HEMOSTASIS) IMPLANT
KIT ACCESSORY DA VINCI DISP (KITS) ×1
KIT ACCESSORY DVNC DISP (KITS) ×1 IMPLANT
KIT BASIN OR (CUSTOM PROCEDURE TRAY) ×2 IMPLANT
LIQUID BAND (GAUZE/BANDAGES/DRESSINGS) ×3 IMPLANT
PENCIL BUTTON HOLSTER BLD 10FT (ELECTRODE) ×2 IMPLANT
POSITIONER SURGICAL ARM (MISCELLANEOUS) ×4 IMPLANT
POUCH SPECIMEN RETRIEVAL 10MM (ENDOMECHANICALS) ×2 IMPLANT
SET TUBE IRRIG SUCTION NO TIP (IRRIGATION / IRRIGATOR) ×2 IMPLANT
SOLUTION ELECTROLUBE (MISCELLANEOUS) ×2 IMPLANT
SURGIFLO W/THROMBIN 8M KIT (HEMOSTASIS) ×2 IMPLANT
SUT ETHILON 3 0 PS 1 (SUTURE) ×2 IMPLANT
SUT MNCRL AB 4-0 PS2 18 (SUTURE) ×4 IMPLANT
SUT V-LOC BARB 180 2/0GR6 GS22 (SUTURE) ×2
SUT VIC AB 0 CT1 27 (SUTURE) ×2
SUT VIC AB 0 CT1 27XBRD ANTBC (SUTURE) ×1 IMPLANT
SUT VICRYL 0 UR6 27IN ABS (SUTURE) ×6 IMPLANT
SUT VLOC BARB 180 ABS3/0GR12 (SUTURE) ×2
SUTURE V-LC BRB 180 2/0GR6GS22 (SUTURE) ×1 IMPLANT
SUTURE VLOC BRB 180 ABS3/0GR12 (SUTURE) ×1 IMPLANT
TOWEL OR 17X26 10 PK STRL BLUE (TOWEL DISPOSABLE) ×4 IMPLANT
TRAY FOLEY W/METER SILVER 16FR (SET/KITS/TRAYS/PACK) ×2 IMPLANT
TRAY LAPAROSCOPIC (CUSTOM PROCEDURE TRAY) ×2 IMPLANT
TROCAR 12M 150ML BLUNT (TROCAR) ×1 IMPLANT
TROCAR BLADELESS OPT 5 100 (ENDOMECHANICALS) ×1 IMPLANT
TROCAR UNIVERSAL OPT 12M 100M (ENDOMECHANICALS) ×2 IMPLANT
TROCAR XCEL 12X100 BLDLESS (ENDOMECHANICALS) ×2 IMPLANT
WATER STERILE IRR 1500ML POUR (IV SOLUTION) ×2 IMPLANT

## 2015-02-05 NOTE — Progress Notes (Signed)
Patient ID: [name] [name], female   DOB: August 16, 1946, 69 y.o.   MRN: [id number]  Post-op note  Subjective: The patient is doing well.  No complaints.  Objective: Vital signs in last 24 hours: Temp:  [97.5 F (36.4 C)-99.1 F (37.3 C)] 99 F (37.2 C) (06/13 2121) Pulse Rate:  [69-95] 75 (06/13 2121) Resp:  [10-18] 16 (06/13 2121) BP: (100-155)/(39-95) 108/56 mmHg (06/13 2145) SpO2:  [95 %-100 %] 99 % (06/13 2121) Weight:  [87.091 kg (192 lb)-91.173 kg (201 lb)] 87.091 kg (192 lb) (06/13 1633)  Intake/Output from previous day:   Intake/Output this shift: Total I/O In: 1482.5 [P.O.:360; I.V.:1122.5] Out: 15 [Drains:15]  Physical Exam:  General: Alert and oriented. Abdomen: Soft, Nondistended. Incisions: Clean and dry.  Lab Results:  Recent Labs  02/05/15 1435  HGB 10.2*  HCT 32.4*    Assessment/Plan: POD#0   1) Continue to monitor, IS   Pryor Curia. MD   LOS: 0 days   Emigdio Wildeman,[name] [name] 02/05/2015, 11:41 PM

## 2015-02-05 NOTE — Anesthesia Procedure Notes (Signed)
Procedure Name: Intubation Date/Time: 02/05/2015 11:14 AM Performed by: Lollie Sails Pre-anesthesia Checklist: Patient identified, Emergency Drugs available, Suction available, Patient being monitored and Timeout performed Patient Re-evaluated:Patient Re-evaluated prior to inductionOxygen Delivery Method: Circle system utilized Preoxygenation: Pre-oxygenation with 100% oxygen Intubation Type: IV induction Ventilation: Mask ventilation without difficulty Laryngoscope Size: Miller and 3 Grade View: Grade I Tube type: Oral Tube size: 7.5 mm Number of attempts: 1 Airway Equipment and Method: Stylet Placement Confirmation: ETT inserted through vocal cords under direct vision,  positive ETCO2 and breath sounds checked- equal and bilateral Secured at: 22 cm Tube secured with: Tape Dental Injury: Teeth and Oropharynx as per pre-operative assessment

## 2015-02-05 NOTE — Transfer of Care (Signed)
Immediate Anesthesia Transfer of Care Note  Patient: [name] [name]  Procedure(s) Performed: Procedure(s): ROBOTIC ASSITED PARTIAL NEPHRECTOMY (Right)  Patient Location: PACU  Anesthesia Type:General  Level of Consciousness: awake, alert  and oriented  Airway & Oxygen Therapy: Patient Spontanous Breathing and Patient connected to face mask oxygen  Post-op Assessment: Report given to RN and Post -op Vital signs reviewed and stable  Post vital signs: Reviewed and stable  Last Vitals:  Filed Vitals:   02/05/15 0919  BP: 143/83  Pulse: 95  Temp: 37.3 C  Resp: 18    Complications: No apparent anesthesia complications

## 2015-02-05 NOTE — Anesthesia Preprocedure Evaluation (Addendum)
Anesthesia Evaluation  Patient identified by MRN, date of birth, ID band Patient awake    Reviewed: Allergy & Precautions, NPO status , Patient's Chart, lab work & pertinent test results  Airway Mallampati: II  TM Distance: >3 FB Neck ROM: Full    Dental   Pulmonary shortness of breath, pneumonia -,  breath sounds clear to auscultation        Cardiovascular negative cardio ROS  Rhythm:Regular Rate:Normal     Neuro/Psych    GI/Hepatic Neg liver ROS, GERD-  ,  Endo/Other  Hypothyroidism   Renal/GU Renal disease     Musculoskeletal  (+) Arthritis -,   Abdominal   Peds  Hematology  (+) anemia ,   Anesthesia Other Findings   Reproductive/Obstetrics                            Anesthesia Physical Anesthesia Plan  ASA: III  Anesthesia Plan: General   Post-op Pain Management:    Induction: Intravenous  Airway Management Planned: Oral ETT  Additional Equipment:   Intra-op Plan:   Post-operative Plan: Possible Post-op intubation/ventilation  Informed Consent: I have reviewed the patients History and Physical, chart, labs and discussed the procedure including the risks, benefits and alternatives for the proposed anesthesia with the patient or authorized representative who has indicated his/her understanding and acceptance.   Dental advisory given  Plan Discussed with: CRNA and Anesthesiologist  Anesthesia Plan Comments:         Anesthesia Quick Evaluation

## 2015-02-05 NOTE — Interval H&P Note (Deleted)
History and Physical Interval Note:  02/05/2015 10:25 AM  [name] [name]  has presented today for surgery, with the diagnosis of RIGHT RENAL MASS  The various methods of treatment have been discussed with the patient and family. After consideration of risks, benefits and other options for treatment, the patient has consented to  Procedure(s): ROBOTIC ASSITED PARTIAL NEPHRECTOMY (Right) as a surgical intervention .  The patient's history has been reviewed, patient examined, no change in status, stable for surgery.  I have reviewed the patient's chart and labs.  Questions were answered to the patient's satisfaction.     Lizzie An,LES

## 2015-02-05 NOTE — Op Note (Signed)
Preoperative diagnosis: Right renal mass  Postoperative diagnosis: Right renal mass  Procedure:  1. Right robotic-assisted laparoscopic partial nephrectomy  Surgeon: Pryor Curia. M.D.  Assistant(s): Debbrah Alar, PA-C  Resident: Dr. Curt Bears  Anesthesia: General  Complications: None  EBL: 50 mL  IVF:  1500 mL crystalloid  Specimens: 1. Right renal mass  Disposition of specimens: Pathology  Intraoperative findings:       1. Warm renal ischemia time: 13 minutes  Drains: 1. # 15 Blake perinephric drain  Indication:  [name] [name] is a 69 y.o. year old patient with a right renal mass.  After a thorough review of the management options for their renal mass, they elected to proceed with surgical treatment and the above procedure.  We have discussed the potential benefits and risks of the procedure, side effects of the proposed treatment, the likelihood of the patient achieving the goals of the procedure, and any potential problems that might occur during the procedure or recuperation. Informed consent has been obtained.   Description of procedure:  The patient was taken to the operating room and a general anesthetic was administered. The patient was given preoperative antibiotics, placed in the right modified flank position with care to pad all potential pressure points, and prepped and draped in the usual sterile fashion. Next a preoperative timeout was performed.  A site was selected on the right side of the umbilicus for placement of the camera port. This was placed using a standard open Hassan technique which allowed entry into the peritoneal cavity under direct vision and without difficulty. A 12 mm port was placed and a pneumoperitoneum established. The camera was then used to inspect the abdomen and there was no evidence of any intra-abdominal injuries or other abnormalities. The remaining abdominal ports were then placed. 8 mm robotic ports were placed in the  right upper quadrant, right lower quadrant, and far right lateral abdominal wall. A 12 mm port was placed in the upper midline for laparoscopic assistance. All ports were placed under direct vision without difficulty. The surgical cart was then docked.   Utilizing the cautery scissors, the white line of Toldt was incised allowing the colon to be mobilized medially and the plane between the mesocolon and the anterior layer of Gerota's fascia to be developed and the kidney to be exposed.  The ureter and gonadal vein were identified inferiorly and the ureter was lifted anteriorly off the psoas muscle.  Dissection proceeded superiorly along the gonadal vein until the renal vein was identified.  The renal hilum was then carefully isolated with a combination of blunt and sharp dissection allowing the renal arterial and venous structures to be separated and isolated in preparation for renal hilar vessel clamping. There was a single branching renal artery and a single renal vein.  12.5 g of IV mannitol was then administered.   Attention turned to the kidney and the perinephric fat surrounding the renal mass was removed and the kidney was mobilized sufficiently for exposure and resection of the renal mass.   Once the renal mass was properly isolated, preparations were made for resection of the tumor.  Reconstructive sutures were placed into the abdomen for the renorrhaphy portion of the procedure.  The renal artery was then clamped with bulldog clamps.  The tumor was then excised with cold scissor dissection along with an adequate visible gross margin of normal renal parenchyma. The tumor appeared to be excised without any gross violation of the tumor. The renal collecting  system was not entered during removal of the tumor.  A running 3-0 V-lock suture was then brought through the capsule of the kidney and run along the base of the renal defect to provide hemostasis and close any entry into the renal collecting system  if present. Weck clips were used to secure this suture outside the renal capsule at the proximal and distal ends. An additional hemostatic agent (Surgiflo) was then placed into the renal defect. A running 2-0 V lock suture was then used to close the capsule of the kidney using a sliding clip technique which resulted in excellent hemostasis.    The bulldog clamps were then removed from the renal hilar vessel(s) and an additional 12.5 g of IV mannitol was administered. Total warm renal ischemia time was 13 minutes. The renal tumor resection site was examined. Hemostasis appeared adequate.   The kidney was placed back into its normal anatomic position and covered with perinephric fat as needed.  A # 76 Blake drain was then brought through the lateral lower port site and positioned in the perinephric space.  It was secured to the skin with a nylon suture. The surgical cart was undocked.  The renal tumor specimen was removed intact within an endopouch retrieval bag via the camera port sites.  The camera port site and the other 12 mm port site were then closed at the fascial level with 0-vicryl suture.  All other laparoscopic/robotic ports were removed under direct vision and the pneumoperitoneum let down with inspection of the operative field performed and hemostasis again confirmed. All incision sites were then injected with local anesthetic and reapproximated at the skin level with 4-0 monocryl subcuticular closures.  Dermabond was applied to the skin.  The patient tolerated the procedure well and without complications.  The patient was able to be extubated and transferred to the recovery unit in satisfactory condition.  Pryor Curia MD

## 2015-02-05 NOTE — Interval H&P Note (Signed)
History and Physical Interval Note:  02/05/2015 10:27 AM  [name] [name]  has presented today for surgery, with the diagnosis of RIGHT RENAL MASS  The various methods of treatment have been discussed with the patient and family. After consideration of risks, benefits and other options for treatment, the patient has consented to  Procedure(s): ROBOTIC ASSITED PARTIAL NEPHRECTOMY (Right) as a surgical intervention .  The patient's history has been reviewed, patient examined, no change in status, stable for surgery.  I have reviewed the patient's chart and labs.  WBC was elevated last week.  A new CXR was performed and there were no concerning findings.  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  She appears stable to proceed with her surgery. Questions were answered to the patient's satisfaction.     Talani Brazee,LES

## 2015-02-05 NOTE — Anesthesia Postprocedure Evaluation (Signed)
  Anesthesia Post-op Note  Patient: [name] [name]  Procedure(s) Performed: Procedure(s): ROBOTIC ASSITED PARTIAL NEPHRECTOMY (Right)  Patient Location: PACU  Anesthesia Type:General  Level of Consciousness: awake  Airway and Oxygen Therapy: Patient Spontanous Breathing  Post-op Pain: mild  Post-op Assessment: Post-op Vital signs reviewed              Post-op Vital Signs: Reviewed  Last Vitals:  Filed Vitals:   02/05/15 1600  BP: 125/53  Pulse: 85  Temp: 36.7 C  Resp: 12    Complications: No apparent anesthesia complications

## 2015-02-06 ENCOUNTER — Ambulatory Visit (HOSPITAL_COMMUNITY): Payer: Medicare Other

## 2015-02-06 ENCOUNTER — Encounter (HOSPITAL_COMMUNITY): Payer: Self-pay | Admitting: Urology

## 2015-02-06 LAB — BASIC METABOLIC PANEL
Anion gap: 8 (ref 5–15)
BUN: 10 mg/dL (ref 6–20)
CALCIUM: 8.3 mg/dL — AB (ref 8.9–10.3)
CO2: 25 mmol/L (ref 22–32)
Chloride: 104 mmol/L (ref 101–111)
Creatinine, Ser: 1.2 mg/dL — ABNORMAL HIGH (ref 0.44–1.00)
GFR calc Af Amer: 52 mL/min — ABNORMAL LOW (ref >60)
GFR calc non Af Amer: 45 mL/min — ABNORMAL LOW (ref >60)
Glucose, Bld: 127 mg/dL — ABNORMAL HIGH (ref 65–99)
Potassium: 3.7 mmol/L (ref 3.5–5.1)
Sodium: 137 mmol/L (ref 135–145)

## 2015-02-06 LAB — CREATININE, FLUID (PLEURAL, PERITONEAL, JP DRAINAGE): Creat, Fluid: 1.1 mg/dL

## 2015-02-06 LAB — HEMOGLOBIN AND HEMATOCRIT, BLOOD
HCT: 29.3 % — ABNORMAL LOW (ref 36.0–46.0)
HEMOGLOBIN: 9.1 g/dL — AB (ref 12.0–15.0)

## 2015-02-06 MED ORDER — CALCIUM CARBONATE ANTACID 500 MG PO CHEW: 2.0000 | CHEWABLE_TABLET | ORAL | Status: DC | PRN

## 2015-02-06 MED ORDER — BISACODYL 10 MG RE SUPP
10.0000 mg | Freq: Once | RECTAL | Status: AC
  Administered 2015-02-06: 10 mg via RECTAL
  Filled 2015-02-06: qty 1

## 2015-02-06 MED ORDER — HYDROCODONE-ACETAMINOPHEN 5-325 MG PO TABS
1.0000 | ORAL_TABLET | Freq: Four times a day (QID) | ORAL | Status: DC | PRN
  Administered 2015-02-06 (×3): 2 via ORAL
  Administered 2015-02-07 (×2): 1 via ORAL
  Filled 2015-02-06: qty 2
  Filled 2015-02-06: qty 1
  Filled 2015-02-06: qty 2
  Filled 2015-02-06: qty 1
  Filled 2015-02-06: qty 2

## 2015-02-06 NOTE — Progress Notes (Signed)
Patient ID: [name] [name], female   DOB: 10/06/1945, 69 y.o.   MRN: [id number]  1 Day Post-Op Subjective: Pt doing well.  Complains of pain on right side of abdomen (similar to chronic pain present even prior to surgery).  No nausea or vomiting overnight.  No flatus.  Objective: Vital signs in last 24 hours: Temp:  [97.5 F (36.4 C)-99.1 F (37.3 C)] 98.3 F (36.8 C) (06/14 0538) Pulse Rate:  [69-95] 71 (06/14 0538) Resp:  [10-18] 15 (06/14 0538) BP: (100-155)/(39-99) 101/45 mmHg (06/14 0538) SpO2:  [95 %-100 %] 97 % (06/14 0538) Weight:  [87.091 kg (192 lb)-91.173 kg (201 lb)] 87.091 kg (192 lb) (06/13 1633)  Intake/Output from previous day: 06/13 0701 - 06/14 0700 In: 5122.5 [P.O.:600; I.V.:4172.5; IV Piggyback:350] Out: 0923 [Urine:1650; Drains:95; Blood:50] Intake/Output this shift:    Physical Exam:  General: Alert and oriented CV: RRR Lungs: Clear Abdomen: Soft, ND, No BS Incisions: C/D/I Ext: NT, No erythema  Lab Results:  Recent Labs  02/05/15 1435 02/06/15 0452  HGB 10.2* 9.1*  HCT 32.4* 29.3*   BMET  Recent Labs  02/05/15 1435 02/06/15 0452  NA 140 137  K 4.1 3.7  CL 107 104  CO2 26 25  GLUCOSE 134* 127*  BUN 11 10  CREATININE 1.08* 1.20*  CALCIUM 8.7* 8.3*     Studies/Results: No results found.  Assessment/Plan: POD #1 s/p right RAL partial nephrectomy - D/C catheter - Ambulate, IS - Advance diet, Dulcolax suppository - Po pain meds - Follow renal function   LOS: 1 day   Luccas Towell,LES 02/06/2015, 7:09 AM

## 2015-02-07 ENCOUNTER — Other Ambulatory Visit: Payer: Self-pay | Admitting: Cardiology

## 2015-02-07 LAB — BASIC METABOLIC PANEL
Anion gap: 5 (ref 5–15)
BUN: 9 mg/dL (ref 6–20)
CALCIUM: 8.2 mg/dL — AB (ref 8.9–10.3)
CHLORIDE: 111 mmol/L (ref 101–111)
CO2: 22 mmol/L (ref 22–32)
CREATININE: 1.03 mg/dL — AB (ref 0.44–1.00)
GFR calc Af Amer: >60 mL/min (ref >60)
GFR calc non Af Amer: 54 mL/min — ABNORMAL LOW (ref >60)
GLUCOSE: 118 mg/dL — AB (ref 65–99)
Potassium: 3.8 mmol/L (ref 3.5–5.1)
Sodium: 138 mmol/L (ref 135–145)

## 2015-02-07 NOTE — Progress Notes (Signed)
Patient ID: [name] [name], female   DOB: [birth year], 69 y.o.   MRN: [id number]  2 Days Post-Op Subjective: Pt noted to have increased confusion this afternoon.  She could not remember why she was hospitalized although was oriented to person, place, and time.  She is eating well, passing flatus, and ambulating.  She has not had any narcotic pain medication since one tablet at 9 AM this morning. Nursing staff has not noted any specific neurologic deficits.  Her family has been at bedside for the last hour.  Objective: Vital signs in last 24 hours: Temp:  [98.3 F (36.8 C)-99.6 F (37.6 C)] 98.3 F (36.8 C) (06/15 1359) Pulse Rate:  [70-76] 73 (06/15 1359) Resp:  [16-17] 17 (06/15 1359) BP: (102-118)/(42-54) 110/50 mmHg (06/15 1359) SpO2:  [92 %-95 %] 95 % (06/15 1359)  Intake/Output from previous day: 06/14 0701 - 06/15 0700 In: 2478.8 [P.O.:480; I.V.:1998.8] Out: 4360 [Urine:4300; Drains:60] Intake/Output this shift: Total I/O In: 120 [P.O.:120] Out: 455 [Urine:450; Drains:5]  Physical Exam:  General: Alert and oriented x 3 although she is still unsure that she had surgery on Monday.  She knows the date, time, and that she has been at [facility] for 2 days. CV: RRR Lungs: Clear Abdomen: Soft, ND, Positive BS,  Incisions:C/D/I Ext: NT, No erythema Neuro: CN II-XII intact, No speech deficits, No sensory or motor deficits of upper or lower extremities, She can ambulate well in front of me with no loss of balance.  Lab Results:  Recent Labs  02/05/15 1435 02/06/15 0452  HGB 10.2* 9.1*  HCT 32.4* 29.3*   BMET  Recent Labs  02/06/15 0452 02/07/15 0441  NA 137 138  K 3.7 3.8  CL 104 111  CO2 25 22  GLUCOSE 127* 118*  BUN 10 9  CREATININE 1.20* 1.03*  CALCIUM 8.3* 8.2*     Studies/Results: No results found.  Assessment/Plan: - Confusion appears improved since her family has arrived although she is still somewhat confused about her surgery.  She has no apparent  neurologic deficits on exam. There does not appear to be any clear objective etiology for her confusion.  She may be sundowning.  I talked with family in detail.  She physically appears very well.  I gave them the option of discharge home as long as someone can be with her at all times.  This is apparently not possible today but will be possible tomorrow.  We agreed to observe her overnight and will minimize pain medications/narcotics. If improved by tomorrow morning, she will be discharged at that time.  The family understands her confusion may get worse in the hospital. - Reviewed pathology result.  Small lymphocytic lymphoma/CLL.  I spoke with Dr. Tresa Moore and review path report with family.  I will discuss further with patient's oncologist, Dr. Jana Hakim, as this is unusual to have an isolated renal metastatic site.   LOS: 2 days   Helio Lack,LES 02/07/2015, 4:28 PM

## 2015-02-07 NOTE — Progress Notes (Signed)
Patient ID: [name] [name], female   DOB: Apr 03, 1946, 69 y.o.   MRN: [id number]  2 Days Post-Op Subjective: Pt feeling better this morning after passing a small amount of flatus.  No nausea.  Tolerating liquids.  She has been ambulating.  Objective: Vital signs in last 24 hours: Temp:  [98.3 F (36.8 C)-99.6 F (37.6 C)] 98.5 F (36.9 C) (06/15 0458) Pulse Rate:  [70-85] 70 (06/15 0458) Resp:  [16-20] 16 (06/15 0458) BP: (102-118)/(42-60) 102/52 mmHg (06/15 0458) SpO2:  [92 %-96 %] 92 % (06/15 0458)  Intake/Output from previous day: 06/14 0701 - 06/15 0700 In: 2410 [P.O.:480; I.V.:1930] Out: 4360 [Urine:4300; Drains:60] Intake/Output this shift: Total I/O In: 1065 [P.O.:240; I.V.:825] Out: 2480 [Urine:2450; Drains:30]  Physical Exam:  General: Alert and oriented CV: RRR Lungs: Clear Abdomen: Soft, ND Incisions: Ext: NT, No erythema  Lab Results:  Recent Labs  02/05/15 1435 02/06/15 0452  HGB 10.2* 9.1*  HCT 32.4* 29.3*   Drain Cr 1.1  BMET  Recent Labs  02/06/15 0452 02/07/15 0441  NA 137 138  K 3.7 3.8  CL 104 111  CO2 25 22  GLUCOSE 127* 118*  BUN 10 9  CREATININE 1.20* 1.03*  CALCIUM 8.3* 8.2*     Studies/Results: Path still pending.  Assessment/Plan: POD # 2 s/p right RAL partial nephrectomy - D/C drain - SL IVF - Regular diet - Discharge home later today if continuing to progress   LOS: 2 days   Aryaan Persichetti,LES 02/07/2015, 6:48 AM

## 2015-02-08 ENCOUNTER — Other Ambulatory Visit: Payer: Self-pay | Admitting: Oncology

## 2015-02-08 DIAGNOSIS — C9110 Chronic lymphocytic leukemia of B-cell type not having achieved remission: Secondary | ICD-10-CM

## 2015-02-08 LAB — CBC
HCT: 30 % — ABNORMAL LOW (ref 36.0–46.0)
HEMOGLOBIN: 9.3 g/dL — AB (ref 12.0–15.0)
MCH: 26.4 pg (ref 26.0–34.0)
MCHC: 31 g/dL (ref 30.0–36.0)
MCV: 85.2 fL (ref 78.0–100.0)
PLATELETS: 148 10*3/uL — AB (ref 150–400)
RBC: 3.52 MIL/uL — AB (ref 3.87–5.11)
RDW: 14.9 % (ref 11.5–15.5)
WBC: 15.2 10*3/uL — ABNORMAL HIGH (ref 4.0–10.5)

## 2015-02-08 LAB — BASIC METABOLIC PANEL
ANION GAP: 8 (ref 5–15)
BUN: 10 mg/dL (ref 6–20)
CALCIUM: 8.5 mg/dL — AB (ref 8.9–10.3)
CHLORIDE: 109 mmol/L (ref 101–111)
CO2: 24 mmol/L (ref 22–32)
Creatinine, Ser: 1.26 mg/dL — ABNORMAL HIGH (ref 0.44–1.00)
GFR calc Af Amer: 49 mL/min — ABNORMAL LOW (ref >60)
GFR calc non Af Amer: 42 mL/min — ABNORMAL LOW (ref >60)
Glucose, Bld: 112 mg/dL — ABNORMAL HIGH (ref 65–99)
POTASSIUM: 3.6 mmol/L (ref 3.5–5.1)
SODIUM: 141 mmol/L (ref 135–145)

## 2015-02-08 NOTE — Progress Notes (Signed)
Patient ID: [name] [name], female   DOB: 1945/11/18, 69 y.o.   MRN: [id number]  3 Days Post-Op Subjective: Pt improved.  She is not confused this morning.  No nausea or vomiting. Pain is well tolerated.  Objective: Vital signs in last 24 hours: Temp:  [98.3 F (36.8 C)-99.3 F (37.4 C)] 98.3 F (36.8 C) (06/16 0455) Pulse Rate:  [73-85] 85 (06/16 0455) Resp:  [17-18] 18 (06/16 0455) BP: (110-129)/(50-70) 120/70 mmHg (06/16 0455) SpO2:  [94 %-95 %] 95 % (06/16 0455)  Intake/Output from previous day: 06/15 0701 - 06/16 0700 In: 240 [P.O.:240] Out: 1655 [Urine:1650; Drains:5] Intake/Output this shift:    Physical Exam:  General: Alert and oriented CV: RRR Lungs: Clear Abdomen: Soft, ND, NT Incisions: C/D/I Ext: NT, No erythema  Lab Results:  Recent Labs  02/05/15 1435 02/06/15 0452 02/08/15 0519  HGB 10.2* 9.1* 9.3*  HCT 32.4* 29.3* 30.0*   Lab Results  Component Value Date   WBC 15.2* 02/08/2015   HGB 9.3* 02/08/2015   HCT 30.0* 02/08/2015   MCV 85.2 02/08/2015   PLT 148* 02/08/2015   WBC consistent with baseline due to CLL.  BMET  Recent Labs  02/07/15 0441 02/08/15 0519  NA 138 141  K 3.8 3.6  CL 111 109  CO2 22 24  GLUCOSE 118* 112*  BUN 9 10  CREATININE 1.03* 1.26*  CALCIUM 8.2* 8.5*     Studies/Results: No results found.  Assessment/Plan: POD # 3 - Pt much improved.  She is not confused this morning and recalls her surgery and reason for being in the hospital.  She is stable for discharge.   LOS: 3 days   Jatavion Peaster,LES 02/08/2015, 7:09 AM

## 2015-02-08 NOTE — Discharge Summary (Signed)
Date of admission: 02/05/2015  Date of discharge: 02/08/2015  Admission diagnosis: Renal Mass   Discharge diagnosis: Renal Mass  Secondary diagnoses: None  History and Physical: For full details, please see admission history and physical. Briefly, [name] [name] is a 69 y.o. year old patient with history of growing right renal mass. Decision was made to treat renal mass with robotic partial nephrectomy.   Hospital Course: The patient underwent robot assisted laparoscopic right partial nephrectomy. They tolerated the procedure well and were transferred to the floor after routine recovery in PACU. Their diet was slowly advanced until they were able to tolerate regular food. They were able to ambulate and void after foley removal without difficulty. JP output was reasonable and the drain was removed prior to discharge.  She did have some confusion on POD#2 which delayed discharge by a day.  This resolved spontaneously. Pain was controlled with PO pain pills. They were suitable for discharge on POD#3.  Laboratory values:  Recent Labs  02/05/15 1435 02/06/15 0452 02/08/15 0519  HGB 10.2* 9.1* 9.3*  HCT 32.4* 29.3* 30.0*    Recent Labs  02/07/15 0441 02/08/15 0519  CREATININE 1.03* 1.26*    Disposition: Home  Discharge instruction: The patient was instructed to be ambulatory but told to refrain from heavy lifting, strenuous activity, or driving.  PE:  Lying in bed in NAD Breathing easily on room air Regular rate Belly appropriately tender to palpation, incisions well approximated, dressing over prior JP site  Discharge medications:    Medication List    STOP taking these medications        BIOTIN PO     chlorpheniramine-HYDROcodone 10-8 MG/5ML Suer  Commonly known as:  TUSSIONEX PENNKINETIC ER     clarithromycin 500 MG tablet  Commonly known as:  BIAXIN     FISH OIL PO     HAIR VITAMINS PO     ibuprofen 200 MG tablet  Commonly known as:  ADVIL,MOTRIN     oyster calcium 500 MG Tabs tablet     vitamin C 1000 MG tablet     Vitamin D-3 1000 UNITS Caps      TAKE these medications        albuterol 108 (90 BASE) MCG/ACT inhaler  Commonly known as:  PROVENTIL HFA;VENTOLIN HFA  Inhale 2 puffs into the lungs every 4 (four) hours as needed for wheezing or shortness of breath (cough, shortness of breath or wheezing.).     ALPRAZolam 0.5 MG tablet  Commonly known as:  XANAX  TAKE 1 TABLET EVERY 8 HOURS AS NEEDED     amitriptyline 150 MG tablet  Commonly known as:  ELAVIL  Take 150 mg by mouth at bedtime.     anastrozole 1 MG tablet  Commonly known as:  ARIMIDEX  TAKE 1 TABLET (1 MG TOTAL) BY MOUTH DAILY.     dexlansoprazole 60 MG capsule  Commonly known as:  DEXILANT  Take 1 capsule (60 mg total) by mouth daily.     HYDROcodone-acetaminophen 5-325 MG per tablet  Commonly known as:  NORCO  Take 1-2 tablets by mouth every 6 (six) hours as needed.     levothyroxine 50 MCG tablet  Commonly known as:  SYNTHROID, LEVOTHROID  Take 1 tablet (50 mcg total) by mouth daily before breakfast.     metoprolol succinate 25 MG 24 hr tablet  Commonly known as:  TOPROL-XL  TAKE 1 TABLET (25 MG TOTAL) BY MOUTH DAILY.     omeprazole 40 MG  capsule  Commonly known as:  PRILOSEC  TAKE ONE CAPSULE BY MOUTH EVERY MORNING     REXULTI 0.5 MG Tabs  Generic drug:  Brexpiprazole  Take 0.5 mg by mouth at bedtime.     ROGAINE 2 % external solution  Generic drug:  minoxidil  Apply 1 application topically 2 (two) times daily as needed (hair.).     VIIBRYD 40 MG Tabs  Generic drug:  Vilazodone HCl  Take 40 mg by mouth 2 (two) times daily.        Followup:      Follow-up Information    Follow up with Mardi Cannady,LES, MD On 03/06/2015.   Specialty:  Urology   Why:  at 12:00   Contact information:   [address] [place] [id number] [phone]

## 2015-02-08 NOTE — Progress Notes (Unsigned)
Got a call from less  [facility] who did a partial nephrectomy on [name] [name] 3 days ago. 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 This requires of course no intervention other than follow-up at this point. She is moderately anemic right now, but she was not a month ago. She will see Dr. Alinda Money in a month and have a repeat CBC. She is already scheduled to see me September 1 and I will obtain additional labs at that time.

## 2015-02-16 ENCOUNTER — Ambulatory Visit (HOSPITAL_COMMUNITY)
Admission: RE | Admit: 2015-02-16 | Discharge: 2015-02-16 | Disposition: A | Discharge status: Home / Self Care | Payer: Medicare Other | Source: Ambulatory Visit | Attending: Emergency Medicine | Admitting: Emergency Medicine

## 2015-02-16 DIAGNOSIS — R1011 Right upper quadrant pain: Secondary | ICD-10-CM | POA: Insufficient documentation

## 2015-02-16 MED ORDER — SINCALIDE 5 MCG IJ SOLR
0.0200 ug/kg | Freq: Once | INTRAMUSCULAR | Status: AC
  Administered 2015-02-16: 1.7 ug via INTRAVENOUS

## 2015-02-16 MED ORDER — TECHNETIUM TC 99M MEBROFENIN IV KIT
5.0000 | PACK | Freq: Once | INTRAVENOUS | Status: AC | PRN
  Administered 2015-02-16: 5 via INTRAVENOUS

## 2015-02-16 MED ORDER — SINCALIDE 5 MCG IJ SOLR
INTRAMUSCULAR | Status: AC
Start: 2015-02-16 — End: 2015-02-16
  Administered 2015-02-16: 1.7 ug via INTRAVENOUS
  Filled 2015-02-16: qty 5

## 2015-02-25 ENCOUNTER — Telehealth: Payer: Self-pay

## 2015-02-25 NOTE — Telephone Encounter (Signed)
Pt was seen last month for a UTI and she believes this condition has returned. She wants to know if we can send in an Rx for this so that she does not have to RTC.

## 2015-02-27 NOTE — Telephone Encounter (Signed)
Pt thinks her symptoms have cleared up so she is going to wait and see if she needs to come to the clinic.

## 2015-03-22 ENCOUNTER — Other Ambulatory Visit: Payer: Self-pay | Admitting: Oncology

## 2015-03-23 NOTE — Telephone Encounter (Signed)
Last OV 12/21/14.  Next OV 04/26/15.  Chart reviewed.

## 2015-04-09 ENCOUNTER — Encounter: Payer: Self-pay | Admitting: *Deleted

## 2015-04-26 ENCOUNTER — Other Ambulatory Visit (HOSPITAL_BASED_OUTPATIENT_CLINIC_OR_DEPARTMENT_OTHER): Payer: Medicare Other

## 2015-04-26 ENCOUNTER — Telehealth: Payer: Self-pay | Admitting: Nurse Practitioner

## 2015-04-26 ENCOUNTER — Ambulatory Visit (HOSPITAL_BASED_OUTPATIENT_CLINIC_OR_DEPARTMENT_OTHER): Payer: Medicare Other | Admitting: Nurse Practitioner

## 2015-04-26 ENCOUNTER — Encounter: Payer: Medicare Other | Admitting: Oncology

## 2015-04-26 VITALS — BP 118/49 | HR 90 | Temp 98.8°F | Resp 18 | Ht 65.0 in | Wt 191.6 lb

## 2015-04-26 DIAGNOSIS — Z170 Estrogen receptor positive status [ER+]: Secondary | ICD-10-CM

## 2015-04-26 DIAGNOSIS — C50511 Malignant neoplasm of lower-outer quadrant of right female breast: Secondary | ICD-10-CM

## 2015-04-26 DIAGNOSIS — C50911 Malignant neoplasm of unspecified site of right female breast: Secondary | ICD-10-CM

## 2015-04-26 DIAGNOSIS — D649 Anemia, unspecified: Secondary | ICD-10-CM

## 2015-04-26 DIAGNOSIS — C9110 Chronic lymphocytic leukemia of B-cell type not having achieved remission: Secondary | ICD-10-CM | POA: Diagnosis not present

## 2015-04-26 DIAGNOSIS — N2889 Other specified disorders of kidney and ureter: Secondary | ICD-10-CM

## 2015-04-26 LAB — COMPREHENSIVE METABOLIC PANEL (CC13)
ALT: 42 U/L (ref 0–55)
ANION GAP: 9 meq/L (ref 3–11)
AST: 31 U/L (ref 5–34)
Albumin: 4.4 g/dL (ref 3.5–5.0)
Alkaline Phosphatase: 83 U/L (ref 40–150)
BUN: 18.8 mg/dL (ref 7.0–26.0)
CHLORIDE: 106 meq/L (ref 98–109)
CO2: 26 mEq/L (ref 22–29)
Calcium: 9.8 mg/dL (ref 8.4–10.4)
Creatinine: 1.6 mg/dL — ABNORMAL HIGH (ref 0.6–1.1)
EGFR: 31 mL/min/{1.73_m2} — ABNORMAL LOW (ref >90)
Glucose: 102 mg/dl (ref 70–140)
POTASSIUM: 4.7 meq/L (ref 3.5–5.1)
Sodium: 141 mEq/L (ref 136–145)
Total Bilirubin: 0.28 mg/dL (ref 0.20–1.20)
Total Protein: 7 g/dL (ref 6.4–8.3)

## 2015-04-26 LAB — CBC WITH DIFFERENTIAL/PLATELET
BASO%: 0.4 % (ref 0.0–2.0)
Basophils Absolute: 0.1 10*3/uL (ref 0.0–0.1)
EOS%: 2.4 % (ref 0.0–7.0)
Eosinophils Absolute: 0.4 10*3/uL (ref 0.0–0.5)
HCT: 38.1 % (ref 34.8–46.6)
HGB: 12 g/dL (ref 11.6–15.9)
LYMPH%: 66.8 % — AB (ref 14.0–49.7)
MCH: 26.7 pg (ref 25.1–34.0)
MCHC: 31.5 g/dL (ref 31.5–36.0)
MCV: 84.9 fL (ref 79.5–101.0)
MONO#: 1.1 10*3/uL — AB (ref 0.1–0.9)
MONO%: 6.1 % (ref 0.0–14.0)
NEUT#: 4.4 10*3/uL (ref 1.5–6.5)
NEUT%: 24.3 % — AB (ref 38.4–76.8)
PLATELETS: 210 10*3/uL (ref 145–400)
RBC: 4.49 10*6/uL (ref 3.70–5.45)
RDW: 15.2 % — ABNORMAL HIGH (ref 11.2–14.5)
WBC: 18 10*3/uL — ABNORMAL HIGH (ref 3.9–10.3)
lymph#: 12 10*3/uL — ABNORMAL HIGH (ref 0.9–3.3)

## 2015-04-26 LAB — LACTATE DEHYDROGENASE (CC13): LDH: 272 U/L — AB (ref 125–245)

## 2015-04-26 LAB — TECHNOLOGIST REVIEW

## 2015-04-26 NOTE — Progress Notes (Signed)
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 Annabell [name] performed a right lumpectomy and axillary lymph node dissection for what according to the patient and her family was a stage I invasive ductal breast cancer. She received radiation adjuvantly and then took tamoxifen for 5 years  In February of 2014 she had a normal screening mammography, but in November 2014 she felt a "hard lump" in her right breast. She brought this to the attention of her urologist, Dr. Izora [name], and he set her up for bilateral diagnostic mammography with mammography at [facility] 07/13/2013. This showed her breast density to be category B. There was no mammographic abnormality noted but ultrasonography showed a 1.4 cm irregularly shaped solid mass in the right breast at the 8:00 position. This was biopsied the same day, and showed ([id number]) an invasive ductal carcinoma, grade 2, estrogen receptor 100% positive, progesterone receptor 13% positive, with an MIB-1 of 33% and HER-2 amplification with a HER-2: CEP 17 ratio of 3.19, and a HER-2 copy number percent all of 4.15.  The patient's subsequent history is as detailed below  INTERVAL HISTORY: [name] [name] returns today for follow up of her recurrent right breast cancer, accompanied by her husband [name] [name]. Since her last visit she had a right kidney wedge excision and partial resection because of a right renal mass. She tolerated this procedure well with no complications. She continues on anastrozole and has some hot flashes, but denies vaginal changes. Her long history of  fibromyalgia and arthritis continue, and are no worse or intense than before.   REVIEW OF SYSTEMS: [name] [name] denies fevers, chills, nausea, or vomiting. She is having constipation now instead of her usual diarrhea. She has frequent UTIs and dehydration. Her blood pressures are lower than average for her lately, and she has had some lightheadedness. She complains of weakness and fatigue. She is short of breath, but denies chest pain, cough, or palpitations. She denies swelling to her arms or legs. She has no changes to her chest wall. A detailed review of systems is otherwise stable.  PAST MEDICAL HISTORY: Past Medical History  Diagnosis Date  . Sinus tachycardia     mild resting  . HLD (hyperlipidemia)   . Orthostatic hypotension     slight in the past  . Anxiety   . Depression   . Diverticulosis   . Fibromyalgia 1978  . Hypothyroidism   . Alopecia   . Ruptured disk     one in neck and two in back  . Plantar fasciitis   . GERD (gastroesophageal reflux disease)   . History of syncope     episode 2008--  no recurrence since  . Ureteral calculi     bilateral  . Leukocytosis   . Chronic back pain     "lower back and upper neck" (07/28/2013)  . 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 ear   . Urgency of urination   . Hematuria   . Sensation of pressure in bladder area   . Wears glasses   . Ecchymosis     FROM IV AND LAB WORK THE PAST WEEK  06-17-2013  .  Stool incontinence     "at times recently"   . Kidney stones 2014  . CAP (community acquired pneumonia)     admission 06-04-2013 secondary to failed oral antibitotics---  last cxr 06-16-2013  much improved  . CLL (chronic lymphocytic leukemia) dx'd 05/2013    "I don't think I have it though" (07/28/2013)  . Breast cancer 1997; 2014    right  . Ejection fraction   . SOB (shortness of breath) 06-17-2013 CURRENTLY RESIDUAL FROM RECENT CAP    new since sore throats- 12/2014    . Arthritis     "spine" (07/28/2013)  . DDD (degenerative disc disease)   .  Deafness in left ear   . S/P chemotherapy, time since greater than 12 weeks     PAST SURGICAL HISTORY: Past Surgical History  Procedure Laterality Date  . Lumbar epidural injection      has had 7 injections  . Total abdominal hysterectomy w/ bilateral salpingoophorectomy  1997  . Breast lumpectomy with axillary lymph node dissection Right 1997  . Transthoracic echocardiogram  05-11-2007  dr Ron [name]    normal lvf/  ef 65%  . Tonsillectomy  AGE 23  . Retinal detachment surgery Right 2013  . Cystoscopy with retrograde pyelogram, ureteroscopy and stent placement Bilateral 06/17/2013    Procedure: CYSTOSCOPY WITH RETROGRADE PYELOGRAM, URETEROSCOPY AND LEFT DOUBLE  J STENT PLACEMENT RIGHT URETERAL HOLMIIUM LASER AND DOUBLE J STENT ;  Surgeon: [name] Ben, MD;  Location: [place];  Service: Urology;  Laterality: Bilateral;  . Holmium laser application Bilateral 29/51/8841    Procedure: HOLMIUM LASER APPLICATION;  Surgeon: [name] Ben, MD;  Location: [place];  Service: Urology;  Laterality: Bilateral;  . Mastectomy w/ nodes partial Right 1997  . Mastectomy complete / simple Right 07/28/2013  . Appendectomy  1997  . Breast biopsy Right 2014  . Cystoscopy with ureteroscopy, stone basketry and stent placement Bilateral 2014  . Lithotripsy Left ~ 06/2013  . Simple mastectomy with axillary sentinel node biopsy Right 07/28/2013    Procedure: RIGHT TOTAL  MASTECTOMY;  Surgeon: [name] Burn. [name] Dover, MD;  Location: [place];  Service: General;  Laterality: Right;  . Portacath placement Left 07/28/2013    Procedure: ATTEMPTED INSERTION PORT-A-CATH;  Surgeon: [name] Burn. [name] Dover, MD;  Location: [place];  Service: General;  Laterality: Left;  . Port-a-cath removal Left 10/03/2014    Procedure: REMOVAL PORT-A-CATH;  Surgeon: [name] Mesa, MD;  Location: [place];  Service: General;  Laterality: Left;  . Abdominal hysterectomy    . Robotic assited partial  nephrectomy Right 02/05/2015    Procedure: ROBOTIC ASSITED PARTIAL NEPHRECTOMY;  Surgeon: [name] Bring, MD;  Location: [facility];  Service: Urology;  Laterality: Right;    FAMILY HISTORY Family History  Problem Relation Age of Onset  . Heart disease Maternal Grandfather   . Diabetes Maternal Grandfather   . Colon cancer Maternal Aunt 79  . Brain cancer Paternal Grandmother     dx in 68s  . Dementia Mother   . Diabetes Mother   . Osteoporosis Mother   . Diabetes Maternal Aunt   . Prostate cancer Father 7  . Bipolar disorder Maternal Aunt   . Stroke Maternal Aunt   . Stomach cancer Paternal Uncle     dx in late 24s   the patient's mother died at the age of 60. The patient's father is alive at age 57. She had no brothers or sisters. Her father has a history of  prostate cancer. There is no history of breast or ovarian cancer in the family. One maternal first cousin has a history of Hodgkin's lymphoma.  GYNECOLOGIC HISTORY:  Menarche age 40, first live birth age 70. She is GX P1. She underwent total abdominal hysterectomy and bilateral salpingo-oophorectomy in 1997 she did not take hormone replacement.  SOCIAL HISTORY: (Updated  11/15/2013)  She is a homemaker. Her husband [name] [name] farms approximately 500 acres. Daughter [name] [name] lives in [place] where she works as a Pension scheme manager for a drug company. The patient has no grandchildren. She has two "grand cats". She attends a local united church of [place]: Not in place   HEALTH MAINTENANCE:  (Updated   11/15/2013 ) Social History  Substance Use Topics  . Smoking status: Never Smoker   . Smokeless tobacco: Never Used  . Alcohol Use: No     Comment: 07/28/2013 "no alcohol since 1994; never had problem w/it"     Colonoscopy: 2009/ [facility]  PAP: Status post hysterectomy  Bone density: May 10/14/2009 at [facility] was normal  Lipid panel:  Not on file   Allergies  Allergen Reactions  . Lasix  [Furosemide] Nausea And Vomiting and Other (See Comments)    headache  . Lithium Other (See Comments)    Dizziness, "cause me to fall"  . Sulfa Antibiotics Hives  . Trazodone And Nefazodone Other (See Comments)    insomnia  . Lyrica [Pregabalin] Diarrhea, Nausea Only and Rash    Current Outpatient Prescriptions  Medication Sig Dispense Refill  . albuterol (PROVENTIL HFA;VENTOLIN HFA) 108 (90 BASE) MCG/ACT inhaler Inhale 2 puffs into the lungs every 4 (four) hours as needed for wheezing or shortness of breath (cough, shortness of breath or wheezing.). 1 Inhaler 1  . ALPRAZolam (XANAX) 0.5 MG tablet TAKE 1 TABLET EVERY 8 HOURS AS NEEDED (Patient taking differently: TAKE 1 TABLET BY MOUTH EVERY TWICE DAILY.) 30 tablet 0  . amitriptyline (ELAVIL) 150 MG tablet Take 150 mg by mouth at bedtime.    Marland Kitchen anastrozole (ARIMIDEX) 1 MG tablet TAKE 1 TABLET (1 MG TOTAL) BY MOUTH DAILY. 90 tablet 0  . dexlansoprazole (DEXILANT) 60 MG capsule Take 1 capsule (60 mg total) by mouth daily. 30 capsule 5  . HYDROcodone-acetaminophen (NORCO) 5-325 MG per tablet Take 1-2 tablets by mouth every 6 (six) hours as needed. 30 tablet 0  . levothyroxine (SYNTHROID, LEVOTHROID) 50 MCG tablet Take 1 tablet (50 mcg total) by mouth daily before breakfast. 90 tablet 3  . metoprolol succinate (TOPROL-XL) 25 MG 24 hr tablet TAKE 1 TABLET (25 MG TOTAL) BY MOUTH DAILY. 30 tablet 11  . minoxidil (ROGAINE) 2 % external solution Apply 1 application topically 2 (two) times daily as needed (hair.).     Marland Kitchen omeprazole (PRILOSEC) 40 MG capsule TAKE ONE CAPSULE BY MOUTH EVERY MORNING 90 capsule 0  . REXULTI 0.5 MG TABS Take 0.5 mg by mouth at bedtime.   0  . VIIBRYD 40 MG TABS Take 40 mg by mouth 2 (two) times daily.   1   No current facility-administered medications for this visit.    OBJECTIVE: 69 year old white woman who appears stated age 53 Vitals:   04/26/15 1525  BP: 118/49  Pulse: 90  Temp: 98.8 F (37.1 C)  Resp: 18      Body mass index is 31.88 kg/(m^2).    ECOG FS:1 - Symptomatic but completely ambulatory Filed Weights   04/26/15 1525  Weight: 191 lb 9.6 oz (  86.909 kg)   Skin: warm, dry  HEENT: sclerae anicteric, conjunctivae pink, oropharynx clear. No thrush or mucositis.  Lymph Nodes: No cervical or supraclavicular lymphadenopathy  Lungs: clear to auscultation bilaterally, no rales, wheezes, or rhonci  Heart: regular rate and rhythm  Abdomen: round, soft, mild discomfort to right upper quadrant with palpations, positive bowel sounds  Musculoskeletal: No focal spinal tenderness, no peripheral edema  Neuro: non focal, well oriented, positive affect  Breasts: right breast status post mastectomy. No evidence of recurrent disease. Right axilla benign. Left breast unremarkable.  LAB RESULTS:   Lab Results  Component Value Date   WBC 18.0* 04/26/2015   NEUTROABS 4.4 04/26/2015   HGB 12.0 04/26/2015   HCT 38.1 04/26/2015   MCV 84.9 04/26/2015   PLT 210 04/26/2015      Chemistry      Component Value Date/Time   NA 141 04/26/2015 1454   NA 141 02/08/2015 0519   K 4.7 04/26/2015 1454   K 3.6 02/08/2015 0519   CL 109 02/08/2015 0519   CO2 26 04/26/2015 1454   CO2 24 02/08/2015 0519   BUN 18.8 04/26/2015 1454   BUN 10 02/08/2015 0519   CREATININE 1.6* 04/26/2015 1454   CREATININE 1.26* 02/08/2015 0519   CREATININE 1.15* 12/07/2014 1538      Component Value Date/Time   CALCIUM 9.8 04/26/2015 1454   CALCIUM 8.5* 02/08/2015 0519   ALKPHOS 83 04/26/2015 1454   ALKPHOS 75 12/07/2014 1538   AST 31 04/26/2015 1454   AST 32 12/07/2014 1538   ALT 42 04/26/2015 1454   ALT 38* 12/07/2014 1538   BILITOT 0.28 04/26/2015 1454   BILITOT 0.3 12/07/2014 1538      STUDIES: No results found.  Most recent echocardiogram on 09/07/14 showed an EF of 55-60%  ASSESSMENT: 69 y.o. BRCA negative [place] woman  (1) status post right lumpectomy and axillary lymph node dissection in 1997 for a stage I  breast cancer, treated with adjuvant radiation and tamoxifen for 5 years  (2) status post right breast biopsy 07/13/2013 for a clinical T1c N0, stage IA invasive ductal carcinoma, grade 2, estrogen receptor 100% positive, progesterone receptor 13% positive, with an MIB-1 of 33%, and HER-2 amplification by CISH with a HER2/CEP 17 ratio of 3.19, and an average HER-2 copy number per cell of 4.15  (3) status post right mastectomy 07/28/2013 for a pT1c pN0, stage IA invasive ductal carcinoma, grade 3, with close but negative margins. Prognostic panel was not repeated  (4) completed weekly paclitaxel x12 12/06/2913, with trastuzumab/ pertuzumab every 3 weeks; pertuzumab was held with third dose on 11/01/2013 due to diarrhea, tried a half dose on cycle 4 again with diarrhea developing  (5) trastuzumab (started 09/20/2013) to be continued for 1 year; most recent echocardiogram on 06/02/2014  shows a well preserved ejection fraction  (6) anastrozole started May 2015; bone density 12/15/2013 normal   OTHER PROBLEMS:  (a) History of chronic lymphoid leukemia diagnosed by flow cytometry 06/29/2013, the cells being CD5, CD20 and CD23 positive, CD10 negative.   (1) right renal mass resected on 02/05/15, consisting of atypical lymphoid proliferation composed of monotonous small lymphocytes   (b) Anemia with a normal MCV and normal ferritin-- B-12 and folate normal  (c) the patient met with Dr. Harlow Mares and has decided against reconstruction   PLAN: Gerlean and I spent 30 minutes detailing the events since her last visit. She has multiple complaints that are chronic in nature and no more severe.  We reviewed the pathology results of the right renal mass, and she understands that while the findings were consisted with involvement by CLL, there are no other actions to be taken at this time. The labs were reviewed in detail, and while her numerically her lymphocyte count is just higher than her baseline, she stable.  Her creatinine is up to 1.6. With her partial right kidney and lack of water intake, I suggested she double the amount of water she is currently drinking to take in 6-8 bottles of water daily.   I encouraged Janeva to become more physically active. She does not do much around the house and naps frequently in a recliner. If she makes it a point to exercise for a few minutes everyday on the elliptical that she already has at home.   As far as her breast cancer is concerned. She is doing well. She is now almost 2 years out from her mastectomy, and has no evidence of recurrent disease. She is tolerating the anastrozole well and will continue this drug for 5 years of antiestrogen therapy.   Jocelyn will return in 6 months for a follow up visit with Dr. Jana Hakim. She understands and agrees with this plan. She has been encouraged to call with any issues that might arise before her next visit here.   Laurie Panda, NP  04/26/2015 5:10 PM

## 2015-04-26 NOTE — Telephone Encounter (Signed)
Gave avs & calendar for March 2017. °

## 2015-04-27 ENCOUNTER — Encounter: Payer: Self-pay | Admitting: Nurse Practitioner

## 2015-05-01 LAB — BETA 2 MICROGLOBULIN, SERUM: Beta-2 Microglobulin: 4.16 mg/L — ABNORMAL HIGH (ref <=2.51)

## 2015-05-02 ENCOUNTER — Other Ambulatory Visit: Payer: Self-pay | Admitting: Oncology

## 2015-05-03 ENCOUNTER — Other Ambulatory Visit: Payer: Self-pay | Admitting: Oncology

## 2015-05-03 NOTE — Telephone Encounter (Signed)
Last ov 04/26/15.  Next ov 10/25/15.  Chart reviewed.

## 2015-05-07 ENCOUNTER — Other Ambulatory Visit: Payer: Self-pay | Admitting: Obstetrics & Gynecology

## 2015-05-07 NOTE — Telephone Encounter (Signed)
Medication refill request: levothyroxine Last AEX:  04/11/13 with SM Next AEX: 06/11/15 with SM Last MMG (if hormonal medication request):  Refill authorized: please advise

## 2015-05-25 ENCOUNTER — Ambulatory Visit: Payer: Medicare Other | Admitting: Obstetrics & Gynecology

## 2015-05-29 ENCOUNTER — Encounter: Payer: Self-pay | Admitting: Emergency Medicine

## 2015-06-11 ENCOUNTER — Ambulatory Visit: Payer: Self-pay | Admitting: Obstetrics & Gynecology

## 2015-06-25 ENCOUNTER — Other Ambulatory Visit: Payer: Self-pay | Admitting: Oncology

## 2015-06-25 NOTE — Telephone Encounter (Signed)
Chart reviewed.

## 2015-07-13 ENCOUNTER — Ambulatory Visit (INDEPENDENT_AMBULATORY_CARE_PROVIDER_SITE_OTHER): Payer: Medicare Other

## 2015-07-13 ENCOUNTER — Ambulatory Visit (INDEPENDENT_AMBULATORY_CARE_PROVIDER_SITE_OTHER): Payer: Medicare Other | Admitting: Family Medicine

## 2015-07-13 VITALS — BP 128/80 | HR 122 | Temp 98.4°F | Resp 18 | Ht 64.5 in | Wt 193.8 lb

## 2015-07-13 DIAGNOSIS — R059 Cough, unspecified: Secondary | ICD-10-CM

## 2015-07-13 DIAGNOSIS — C9110 Chronic lymphocytic leukemia of B-cell type not having achieved remission: Secondary | ICD-10-CM | POA: Diagnosis not present

## 2015-07-13 DIAGNOSIS — R05 Cough: Secondary | ICD-10-CM

## 2015-07-13 DIAGNOSIS — J069 Acute upper respiratory infection, unspecified: Secondary | ICD-10-CM

## 2015-07-13 MED ORDER — AZITHROMYCIN 250 MG PO TABS: ORAL_TABLET | ORAL | Status: DC

## 2015-07-13 NOTE — Patient Instructions (Signed)
Mucinex or Mucinex DM as needed for cough, drink plenty of fluids, rest. Your x-ray today looks okay, I suspect this is still a virus. However if your cough is not starting to improve into next week, I did print out the antibiotic as we discussed. Would recommend flu vaccine sometime late next week as long as your symptoms are improving and you do not have a fever.   Return to the clinic or go to the nearest emergency room if any of your symptoms worsen or new symptoms occur.   Cough, Adult Coughing is a reflex that clears your throat and your airways. Coughing helps to heal and protect your lungs. It is normal to cough occasionally, but a cough that happens with other symptoms or lasts a long time may be a sign of a condition that needs treatment. A cough may last only 2-3 weeks (acute), or it may last longer than 8 weeks (chronic). CAUSES Coughing is commonly caused by:  Breathing in substances that irritate your lungs.  A viral or bacterial respiratory infection.  Allergies.  Asthma.  Postnasal drip.  Smoking.  Acid backing up from the stomach into the esophagus (gastroesophageal reflux).  Certain medicines.  Chronic lung problems, including COPD (or rarely, lung cancer).  Other medical conditions such as heart failure. HOME CARE INSTRUCTIONS  Pay attention to any changes in your symptoms. Take these actions to help with your discomfort:  Take medicines only as told by your health care provider.  If you were prescribed an antibiotic medicine, take it as told by your health care provider. Do not stop taking the antibiotic even if you start to feel better.  Talk with your health care provider before you take a cough suppressant medicine.  Drink enough fluid to keep your urine clear or pale yellow.  If the air is dry, use a cold steam vaporizer or humidifier in your bedroom or your home to help loosen secretions.  Avoid anything that causes you to cough at work or at  home.  If your cough is worse at night, try sleeping in a semi-upright position.  Avoid cigarette smoke. If you smoke, quit smoking. If you need help quitting, ask your health care provider.  Avoid caffeine.  Avoid alcohol.  Rest as needed.  SEEK MEDICAL CARE IF:   You have new symptoms.  You cough up pus.  Your cough does not get better after 2-3 weeks, or your cough gets worse.  You cannot control your cough with suppressant medicines and you are losing sleep.  You develop pain that is getting worse or pain that is not controlled with pain medicines.  You have a fever.  You have unexplained weight loss.  You have night sweats. SEEK IMMEDIATE MEDICAL CARE IF:  You cough up blood.  You have difficulty breathing.  Your heartbeat is very fast.   This information is not intended to replace advice given to you by your health care provider. Make sure you discuss any questions you have with your health care provider.   Document Released: 02/07/2011 Document Revised: 05/02/2015 Document Reviewed: 10/18/2014 Elsevier Interactive Patient Education 2016 Elsevier Inc.  Upper Respiratory Infection, Adult Most upper respiratory infections (URIs) are a viral infection of the air passages leading to the lungs. A URI affects the nose, throat, and upper air passages. The most common type of URI is nasopharyngitis and is typically referred to as "the common cold." URIs run their course and usually go away on their own. Most of  the time, a URI does not require medical attention, but sometimes a bacterial infection in the upper airways can follow a viral infection. This is called a secondary infection. Sinus and middle ear infections are common types of secondary upper respiratory infections. Bacterial pneumonia can also complicate a URI. A URI can worsen asthma and chronic obstructive pulmonary disease (COPD). Sometimes, these complications can require emergency medical care and may be  life threatening.  CAUSES Almost all URIs are caused by viruses. A virus is a type of germ and can spread from one person to another.  RISKS FACTORS You may be at risk for a URI if:   You smoke.   You have chronic heart or lung disease.  You have a weakened defense (immune) system.   You are very young or very old.   You have nasal allergies or asthma.  You work in crowded or poorly ventilated areas.  You work in health care facilities or schools. SIGNS AND SYMPTOMS  Symptoms typically develop 2-3 days after you come in contact with a cold virus. Most viral URIs last 7-10 days. However, viral URIs from the influenza virus (flu virus) can last 14-18 days and are typically more severe. Symptoms may include:   Runny or stuffy (congested) nose.   Sneezing.   Cough.   Sore throat.   Headache.   Fatigue.   Fever.   Loss of appetite.   Pain in your forehead, behind your eyes, and over your cheekbones (sinus pain).  Muscle aches.  DIAGNOSIS  Your health care provider may diagnose a URI by:  Physical exam.  Tests to check that your symptoms are not due to another condition such as:  Strep throat.  Sinusitis.  Pneumonia.  Asthma. TREATMENT  A URI goes away on its own with time. It cannot be cured with medicines, but medicines may be prescribed or recommended to relieve symptoms. Medicines may help:  Reduce your fever.  Reduce your cough.  Relieve nasal congestion. HOME CARE INSTRUCTIONS   Take medicines only as directed by your health care provider.   Gargle warm saltwater or take cough drops to comfort your throat as directed by your health care provider.  Use a warm mist humidifier or inhale steam from a shower to increase air moisture. This may make it easier to breathe.  Drink enough fluid to keep your urine clear or pale yellow.   Eat soups and other clear broths and maintain good nutrition.   Rest as needed.   Return to work  when your temperature has returned to normal or as your health care provider advises. You may need to stay home longer to avoid infecting others. You can also use a face mask and careful hand washing to prevent spread of the virus.  Increase the usage of your inhaler if you have asthma.   Do not use any tobacco products, including cigarettes, chewing tobacco, or electronic cigarettes. If you need help quitting, ask your health care provider. PREVENTION  The best way to protect yourself from getting a cold is to practice good hygiene.   Avoid oral or hand contact with people with cold symptoms.   Wash your hands often if contact occurs.  There is no clear evidence that vitamin C, vitamin E, echinacea, or exercise reduces the chance of developing a cold. However, it is always recommended to get plenty of rest, exercise, and practice good nutrition.  SEEK MEDICAL CARE IF:   You are getting worse rather than better.  Your symptoms are not controlled by medicine.   You have chills.  You have worsening shortness of breath.  You have brown or red mucus.  You have yellow or brown nasal discharge.  You have pain in your face, especially when you bend forward.  You have a fever.  You have swollen neck glands.  You have pain while swallowing.  You have white areas in the back of your throat. SEEK IMMEDIATE MEDICAL CARE IF:   You have severe or persistent:  Headache.  Ear pain.  Sinus pain.  Chest pain.  You have chronic lung disease and any of the following:  Wheezing.  Prolonged cough.  Coughing up blood.  A change in your usual mucus.  You have a stiff neck.  You have changes in your:  Vision.  Hearing.  Thinking.  Mood. MAKE SURE YOU:   Understand these instructions.  Will watch your condition.  Will get help right away if you are not doing well or get worse.   This information is not intended to replace advice given to you by your health care  provider. Make sure you discuss any questions you have with your health care provider.   Document Released: 02/04/2001 Document Revised: 12/26/2014 Document Reviewed: 11/16/2013 Elsevier Interactive Patient Education [employer].

## 2015-07-13 NOTE — Progress Notes (Signed)
Subjective:  This chart was scribed for [name] Ray, MD by [name], Medical Scribe. This patient was seen in room 2 and the patient's care was started 3:38 PM.   Patient ID: [name] [name], female    DOB: [birth year], 69 y.o.   MRN: [id number]  HPI [name] is a 68 y.o. female Pt is here for productive cough and congestion for past 2 weeks.  History H/o breast cancer with recurrence in 2014. After surgery chemotherapy, she was continued on anastrozole with most recent oncology in sept. Also h/o CLL, most recent cbc indicated wbc of 18, similar to 5 months ago at 15. Multiple medical problems noted on her problem list including chronic pain syndrome, fibromyalgia. Also had right nephrectomy in June of this year. 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 Treated for acute bronchitis earlier this year. Treated with biaxin, albuterol and tussionex.   Congestion Pt states that her nose started to be congested in the right side then moved to the left side. And then, the beginning of the week, she feels that it moved down to her chest. She also has productive cough with green sputum. She denies copd, and asthma. No recent chemo.   Ear Pain Her left ear has been hurting earlier this week too. She also hears ringing in her ears, but not sure if it's "in her head".   Cardiology She has tachycardia. She last saw her cardiologist (Dr. Ron Parker) in Jan 2016 with normal and stable EKG. She takes toprol for her tachycardia.   Patient Active Problem List   Diagnosis Date Noted  . Renal mass 02/05/2015  . Ejection fraction   . Anemia, unspecified 10/25/2013  . Diarrhea 10/04/2013  . GERD (gastroesophageal reflux disease) 09/27/2013  . Seroma complicating a procedure 08/12/2013  . CLL (chronic lymphocytic leukemia) ([place]) 07/27/2013  . Breast cancer of lower-outer quadrant of right female breast ([place]) 07/26/2013  . Nephrolithiasis 06/16/2013  .  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 Chronic pain syndrome 05/10/2013  . H/O long-term treatment with high-risk medication 05/10/2013  . DDD (degenerative disc disease) 05/10/2013  . SOB (shortness of breath)   . Sinus tachycardia ([place])   . HLD (hyperlipidemia)   . Orthostatic hypotension   . Anxiety   . Arthritis   . Diverticulosis   . Fibromyalgia   . Hypothyroidism    Past Medical History  Diagnosis Date  . Sinus tachycardia ([facility])     mild resting  . HLD (hyperlipidemia)   . Orthostatic hypotension     slight in the past  . Anxiety   . Depression   . Diverticulosis   . Fibromyalgia 1978  . Hypothyroidism   . Alopecia   . Ruptured disk     one in neck and two in back  . Plantar fasciitis   . GERD (gastroesophageal reflux disease)   . History of syncope     episode 2008--  no recurrence since  . Ureteral calculi     bilateral  . Leukocytosis   . Chronic back pain     "lower back and upper neck" (07/28/2013)  . 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 ear   . Urgency of urination   . Hematuria   . Sensation of pressure in bladder area   . Wears glasses   . Ecchymosis     FROM IV AND LAB WORK THE PAST WEEK  06-17-2013  . Stool incontinence     "at times  recently"   . Kidney stones 2014  . CAP (community acquired pneumonia)     admission 06-04-2013 secondary to failed oral antibitotics---  last cxr 06-16-2013  much improved  . CLL (chronic lymphocytic leukemia) ([place]) dx'd 05/2013    "I don't think I have it though" (07/28/2013)  . Breast cancer ([name]) 1997; 2014    right  . Ejection fraction   . SOB (shortness of breath) 06-17-2013 CURRENTLY RESIDUAL FROM RECENT CAP    new since sore throats- 12/2014    . Arthritis     "spine" (07/28/2013)  . DDD (degenerative disc disease)   . Deafness in left ear   . S/P chemotherapy, time since greater than 12 weeks    Past Surgical History  Procedure Laterality Date  . Lumbar epidural injection      has had 7 injections  . Total abdominal  hysterectomy w/ bilateral salpingoophorectomy  1997  . Breast lumpectomy with axillary lymph node dissection Right 1997  . Transthoracic echocardiogram  05-11-2007  dr Ron Parker    normal lvf/  ef 65%  . Tonsillectomy  AGE 72  . Retinal detachment surgery Right 2013  . Cystoscopy with retrograde pyelogram, ureteroscopy and stent placement Bilateral 06/17/2013    Procedure: CYSTOSCOPY WITH RETROGRADE PYELOGRAM, URETEROSCOPY AND LEFT DOUBLE  J STENT PLACEMENT RIGHT URETERAL HOLMIIUM LASER AND DOUBLE J STENT ;  Surgeon: Hanley Ben, MD;  Location: [facility];  Service: Urology;  Laterality: Bilateral;  . Holmium laser application Bilateral [id number]    Procedure: HOLMIUM LASER APPLICATION;  Surgeon: Hanley Ben, MD;  Location: [facility];  Service: Urology;  Laterality: Bilateral;  . Mastectomy w/ nodes partial Right 1997  . Mastectomy complete / simple Right 07/28/2013  . Appendectomy  1997  . Breast biopsy Right 2014  . Cystoscopy with ureteroscopy, stone basketry and stent placement Bilateral 2014  . Lithotripsy Left ~ 06/2013  . Simple mastectomy with axillary sentinel node biopsy Right 07/28/2013    Procedure: RIGHT TOTAL  MASTECTOMY;  Surgeon: Imogene Burn. Georgette Dover, MD;  Location: [facility];  Service: General;  Laterality: Right;  . Portacath placement Left 07/28/2013    Procedure: ATTEMPTED INSERTION PORT-A-CATH;  Surgeon: Imogene Burn. Georgette Dover, MD;  Location: [facility];  Service: General;  Laterality: Left;  . Port-a-cath removal Left 10/03/2014    Procedure: REMOVAL PORT-A-CATH;  Surgeon: Donnie Mesa, MD;  Location: [facility];  Service: General;  Laterality: Left;  . Abdominal hysterectomy    . Robotic assited partial nephrectomy Right 02/05/2015    Procedure: ROBOTIC ASSITED PARTIAL NEPHRECTOMY;  Surgeon: Raynelle Bring, MD;  Location: [facility];  Service: Urology;  Laterality: Right;   Allergies  Allergen Reactions  . Lasix [Furosemide] Nausea And  Vomiting and Other (See Comments)    headache  . Lithium Other (See Comments)    Dizziness, "cause me to fall"  . Sulfa Antibiotics Hives  . Trazodone And Nefazodone Other (See Comments)    insomnia  . Lyrica [Pregabalin] Diarrhea, Nausea Only and Rash   Prior to Admission medications   Medication Sig Start Date End Date Taking? Authorizing Provider  albuterol (PROVENTIL HFA;VENTOLIN HFA) 108 (90 BASE) MCG/ACT inhaler Inhale 2 puffs into the lungs every 4 (four) hours as needed for wheezing or shortness of breath (cough, shortness of breath or wheezing.). 01/04/15   Roselee Culver, MD  ALPRAZolam (XANAX) 0.5 MG tablet TAKE 1 TABLET EVERY 8 HOURS AS NEEDED Patient taking differently: TAKE 1 TABLET BY MOUTH  EVERY TWICE DAILY.    Darlyne Russian, MD  amitriptyline (ELAVIL) 150 MG tablet Take 150 mg by mouth at bedtime.    Historical Provider, MD  anastrozole (ARIMIDEX) 1 MG tablet TAKE 1 TABLET (1 MG TOTAL) BY MOUTH DAILY. 05/03/15   Chauncey Cruel, MD  dexlansoprazole (DEXILANT) 60 MG capsule Take 1 capsule (60 mg total) by mouth daily. 01/25/15   Roselee Culver, MD  HYDROcodone-acetaminophen (NORCO) 5-325 MG per tablet Take 1-2 tablets by mouth every 6 (six) hours as needed. 02/05/15   Debbrah Alar, PA-C  levothyroxine (SYNTHROID, LEVOTHROID) 50 MCG tablet TAKE 1 TABLET (50 MCG TOTAL) BY MOUTH DAILY BEFORE BREAKFAST. 05/07/15   Megan Salon, MD  metoprolol succinate (TOPROL-XL) 25 MG 24 hr tablet TAKE 1 TABLET (25 MG TOTAL) BY MOUTH DAILY. 02/07/15   Carlena Bjornstad, MD  minoxidil (ROGAINE) 2 % external solution Apply 1 application topically 2 (two) times daily as needed (hair.).     Historical Provider, MD  omeprazole (PRILOSEC) 40 MG capsule TAKE ONE CAPSULE BY MOUTH EVERY MORNING 06/25/15   Chauncey Cruel, MD  REXULTI 0.5 MG TABS Take 0.5 mg by mouth at bedtime.  11/05/14   Historical Provider, MD  VIIBRYD 40 MG TABS Take 40 mg by mouth 2 (two) times daily.  11/13/14   Historical Provider,  MD   Social History   Social History  . Marital Status: Married    Spouse Name: N/A  . Number of Children: 1  . Years of Education: N/A   Occupational History  . homemaker    Social History Main Topics  . Smoking status: Never Smoker   . Smokeless tobacco: Never Used  . Alcohol Use: No     Comment: 07/28/2013 "no alcohol since 1994; never had problem w/it"  . Drug Use: No  . Sexual Activity: No     Comment: TAH/BSO   Other Topics Concern  . Not on file   Social History Narrative    Review of Systems  Constitutional: Positive for fatigue. Negative for fever.  HENT: Positive for congestion, ear pain and tinnitus. Negative for sinus pressure.   Respiratory: Positive for cough.        Objective:   Physical Exam  Constitutional: She is oriented to person, place, and time. She appears well-developed and well-nourished. No distress.  HENT:  Head: Normocephalic and atraumatic.  Right Ear: Hearing, tympanic membrane, external ear and ear canal normal. Tympanic membrane is not retracted.  Left Ear: Hearing, tympanic membrane, external ear and ear canal normal. Tympanic membrane is not retracted.  Nose: Nose normal.  Mouth/Throat: Oropharynx is clear and moist. No oropharyngeal exudate.  Fluid in ears, right more than left.   Eyes: Conjunctivae and EOM are normal. Pupils are equal, round, and reactive to light.  Cardiovascular: Regular rhythm, normal heart sounds and intact distal pulses.  Tachycardia present.   No murmur heard. Pulmonary/Chest: Effort normal and breath sounds normal. No respiratory distress. She has no wheezes. She has no rhonchi.  Lymphadenopathy:    She has no cervical adenopathy.  Neurological: She is alert and oriented to person, place, and time.  Skin: Skin is warm and dry. No rash noted.  Psychiatric: She has a normal mood and affect. Her behavior is normal.  Vitals reviewed.   Filed Vitals:   07/13/15 1425  BP: 128/80  Pulse: 122  Temp: 98.4  F (36.9 C)  TempSrc: Oral  Resp: 18  Height: 5' 4.5" (1.638 m)  Weight: 193 lb 12.8 oz (87.907 kg)  SpO2: 98%   [facility] reading (PRIMARY) by Dr. Carlota Raspberry : chest xray: appears clear, no apparent pneumonia, or acute pulmonary findings.       Assessment & Plan:   FANNY BEEL is a 69 y.o. female Cough - Plan: DG Chest 2 View, azithromycin (ZITHROMAX) 250 MG tablet  Acute upper respiratory infection  CLL (chronic lymphocytic leukemia) ([place])    -Reassuring exam and vital signs. No apparent pneumonia or concerning findings on chest x-[name]. She is not currently on any immune suppressants, but does have a complicated past medical history as above. This includes CLL, so CBC was not drawn today, as chronically has a leukocytosis.  - Suspected viral infection at this time, recommended symptomatic care with Mucinex, fluids, rest. If cough is not starting to improve into next week, could start azithromycin for early lower respiratory tract infection, but discussed most likely viral infection at present, and risks and side effects of antibiotics were discussed.  -RTC precautions.  -An end of visit, did have a question about a lesion on her scan of her lower abdomen. A brief eval appears to be a possible seborrheic keratosis, but as she has an appointment in approximately 5 weeks with her primary care provider, can have this rechecked then as long as no change in appearance or worsening prior to that time. If it does change, return for recheck sooner.    Meds ordered this encounter  Medications  . aspirin 81 MG tablet    Sig: Take 81 mg by mouth daily.  . Omega-3 Fatty Acids (FISH OIL PO)    Sig: Take by mouth.  . Cholecalciferol (VITAMIN D-3 PO)    Sig: Take by mouth.  Marland Kitchen azithromycin (ZITHROMAX) 250 MG tablet    Sig: Take 2 pills by mouth on day 1, then 1 pill by mouth per day on days 2 through 5.    Dispense:  6 tablet    Refill:  0   Patient Instructions  Mucinex or Mucinex DM as needed  for cough, drink plenty of fluids, rest. Your x-[name] today looks okay, I suspect this is still a virus. However if your cough is not starting to improve into next week, I did print out the antibiotic as we discussed. Would recommend flu vaccine sometime late next week as long as your symptoms are improving and you do not have a fever.   Return to the clinic or go to the nearest emergency room if any of your symptoms worsen or new symptoms occur.   Cough, Adult Coughing is a reflex that clears your throat and your airways. Coughing helps to heal and protect your lungs. It is normal to cough occasionally, but a cough that happens with other symptoms or lasts a long time may be a sign of a condition that needs treatment. A cough may last only 2-3 weeks (acute), or it may last longer than 8 weeks (chronic). CAUSES Coughing is commonly caused by:  Breathing in substances that irritate your lungs.  A viral or bacterial respiratory infection.  Allergies.  Asthma.  Postnasal drip.  Smoking.  Acid backing up from the stomach into the esophagus (gastroesophageal reflux).  Certain medicines.  Chronic lung problems, including COPD (or rarely, lung cancer).  Other medical conditions such as heart failure. HOME CARE INSTRUCTIONS  Pay attention to any changes in your symptoms. Take these actions to help with your discomfort:  Take medicines only as told by your health  care provider.  If you were prescribed an antibiotic medicine, take it as told by your health care provider. Do not stop taking the antibiotic even if you start to feel better.  Talk with your health care provider before you take a cough suppressant medicine.  Drink enough fluid to keep your urine clear or pale yellow.  If the air is dry, use a cold steam vaporizer or humidifier in your bedroom or your home to help loosen secretions.  Avoid anything that causes you to cough at work or at home.  If your cough is worse at  night, try sleeping in a semi-upright position.  Avoid cigarette smoke. If you smoke, quit smoking. If you need help quitting, ask your health care provider.  Avoid caffeine.  Avoid alcohol.  Rest as needed.  SEEK MEDICAL CARE IF:   You have new symptoms.  You cough up pus.  Your cough does not get better after 2-3 weeks, or your cough gets worse.  You cannot control your cough with suppressant medicines and you are losing sleep.  You develop pain that is getting worse or pain that is not controlled with pain medicines.  You have a fever.  You have unexplained weight loss.  You have night sweats. SEEK IMMEDIATE MEDICAL CARE IF:  You cough up blood.  You have difficulty breathing.  Your heartbeat is very fast.   This information is not intended to replace advice given to you by your health care provider. Make sure you discuss any questions you have with your health care provider.   Document Released: 02/07/2011 Document Revised: 05/02/2015 Document Reviewed: 10/18/2014 Elsevier Interactive Patient Education 2016 Elsevier Inc.  Upper Respiratory Infection, Adult Most upper respiratory infections (URIs) are a viral infection of the air passages leading to the lungs. A URI affects the nose, throat, and upper air passages. The most common type of URI is nasopharyngitis and is typically referred to as "the common cold." URIs run their course and usually go away on their own. Most of the time, a URI does not require medical attention, but sometimes a bacterial infection in the upper airways can follow a viral infection. This is called a secondary infection. Sinus and middle ear infections are common types of secondary upper respiratory infections. Bacterial pneumonia can also complicate a URI. A URI can worsen asthma and chronic obstructive pulmonary disease (COPD). Sometimes, these complications can require emergency medical care and may be life threatening.  CAUSES Almost all  URIs are caused by viruses. A virus is a type of germ and can spread from one person to another.  RISKS FACTORS You may be at risk for a URI if:   You smoke.   You have chronic heart or lung disease.  You have a weakened defense (immune) system.   You are very young or very old.   You have nasal allergies or asthma.  You work in crowded or poorly ventilated areas.  You work in health care facilities or schools. SIGNS AND SYMPTOMS  Symptoms typically develop 2-3 days after you come in contact with a cold virus. Most viral URIs last 7-10 days. However, viral URIs from the influenza virus (flu virus) can last 14-18 days and are typically more severe. Symptoms may include:   Runny or stuffy (congested) nose.   Sneezing.   Cough.   Sore throat.   Headache.   Fatigue.   Fever.   Loss of appetite.   Pain in your forehead, behind your eyes, and  over your cheekbones (sinus pain).  Muscle aches.  DIAGNOSIS  Your health care provider may diagnose a URI by:  Physical exam.  Tests to check that your symptoms are not due to another condition such as:  Strep throat.  Sinusitis.  Pneumonia.  Asthma. TREATMENT  A URI goes away on its own with time. It cannot be cured with medicines, but medicines may be prescribed or recommended to relieve symptoms. Medicines may help:  Reduce your fever.  Reduce your cough.  Relieve nasal congestion. HOME CARE INSTRUCTIONS   Take medicines only as directed by your health care provider.   Gargle warm saltwater or take cough drops to comfort your throat as directed by your health care provider.  Use a warm mist humidifier or inhale steam from a shower to increase air moisture. This may make it easier to breathe.  Drink enough fluid to keep your urine clear or pale yellow.   Eat soups and other clear broths and maintain good nutrition.   Rest as needed.   Return to work when your temperature has returned to  normal or as your health care provider advises. You may need to stay home longer to avoid infecting others. You can also use a face mask and careful hand washing to prevent spread of the virus.  Increase the usage of your inhaler if you have asthma.   Do not use any tobacco products, including cigarettes, chewing tobacco, or electronic cigarettes. If you need help quitting, ask your health care provider. PREVENTION  The best way to protect yourself from getting a cold is to practice good hygiene.   Avoid oral or hand contact with people with cold symptoms.   Wash your hands often if contact occurs.  There is no clear evidence that vitamin C, vitamin E, echinacea, or exercise reduces the chance of developing a cold. However, it is always recommended to get plenty of rest, exercise, and practice good nutrition.  SEEK MEDICAL CARE IF:   You are getting worse rather than better.   Your symptoms are not controlled by medicine.   You have chills.  You have worsening shortness of breath.  You have brown or red mucus.  You have yellow or brown nasal discharge.  You have pain in your face, especially when you bend forward.  You have a fever.  You have swollen neck glands.  You have pain while swallowing.  You have white areas in the back of your throat. SEEK IMMEDIATE MEDICAL CARE IF:   You have severe or persistent:  Headache.  Ear pain.  Sinus pain.  Chest pain.  You have chronic lung disease and any of the following:  Wheezing.  Prolonged cough.  Coughing up blood.  A change in your usual mucus.  You have a stiff neck.  You have changes in your:  Vision.  Hearing.  Thinking.  Mood. MAKE SURE YOU:   Understand these instructions.  Will watch your condition.  Will get help right away if you are not doing well or get worse.   This information is not intended to replace advice given to you by your health care provider. Make sure you discuss any  questions you have with your health care provider.   Document Released: 02/04/2001 Document Revised: 12/26/2014 Document Reviewed: 11/16/2013 Elsevier Interactive Patient Education [employer].        By signing my name below, I, [name], attest that this documentation has been prepared under the direction and in the  presence of [name] Ray, MD. Electronically Signed: [name], [place]. 07/13/2015 , 3:38 PM .   I personally performed the services described in this documentation, which was scribed in my presence. The recorded information has been reviewed and considered, and addended by me as needed.

## 2015-08-01 ENCOUNTER — Telehealth: Payer: Self-pay

## 2015-08-01 NOTE — Telephone Encounter (Signed)
RTC

## 2015-08-01 NOTE — Telephone Encounter (Signed)
Spoke with pt, advised her to RTC.

## 2015-08-01 NOTE — Telephone Encounter (Signed)
Pt states she hurt when she cough and also she is so breathlessness when she walks around, didn't want to come in if she didn't have to Please call [phone]

## 2015-08-02 ENCOUNTER — Encounter (HOSPITAL_COMMUNITY): Payer: Self-pay | Admitting: Emergency Medicine

## 2015-08-02 ENCOUNTER — Ambulatory Visit (INDEPENDENT_AMBULATORY_CARE_PROVIDER_SITE_OTHER): Payer: Medicare Other

## 2015-08-02 ENCOUNTER — Ambulatory Visit (INDEPENDENT_AMBULATORY_CARE_PROVIDER_SITE_OTHER): Payer: Medicare Other | Admitting: Family Medicine

## 2015-08-02 ENCOUNTER — Emergency Department (HOSPITAL_COMMUNITY): Payer: Medicare Other

## 2015-08-02 ENCOUNTER — Emergency Department (HOSPITAL_COMMUNITY)
Admission: EM | Admit: 2015-08-02 | Discharge: 2015-08-02 | Disposition: A | Discharge status: Home / Self Care | Payer: Medicare Other | Attending: Emergency Medicine | Admitting: Emergency Medicine

## 2015-08-02 VITALS — BP 126/78 | HR 95 | Temp 98.9°F | Resp 18 | Ht 64.25 in | Wt 198.8 lb

## 2015-08-02 DIAGNOSIS — Z862 Personal history of diseases of the blood and blood-forming organs and certain disorders involving the immune mechanism: Secondary | ICD-10-CM | POA: Diagnosis not present

## 2015-08-02 DIAGNOSIS — Z7982 Long term (current) use of aspirin: Secondary | ICD-10-CM | POA: Diagnosis not present

## 2015-08-02 DIAGNOSIS — C9110 Chronic lymphocytic leukemia of B-cell type not having achieved remission: Secondary | ICD-10-CM | POA: Diagnosis not present

## 2015-08-02 DIAGNOSIS — M1990 Unspecified osteoarthritis, unspecified site: Secondary | ICD-10-CM | POA: Insufficient documentation

## 2015-08-02 DIAGNOSIS — Z856 Personal history of leukemia: Secondary | ICD-10-CM | POA: Insufficient documentation

## 2015-08-02 DIAGNOSIS — G8929 Other chronic pain: Secondary | ICD-10-CM | POA: Insufficient documentation

## 2015-08-02 DIAGNOSIS — R05 Cough: Secondary | ICD-10-CM | POA: Diagnosis not present

## 2015-08-02 DIAGNOSIS — Z872 Personal history of diseases of the skin and subcutaneous tissue: Secondary | ICD-10-CM | POA: Insufficient documentation

## 2015-08-02 DIAGNOSIS — H538 Other visual disturbances: Secondary | ICD-10-CM | POA: Diagnosis not present

## 2015-08-02 DIAGNOSIS — R519 Headache, unspecified: Secondary | ICD-10-CM

## 2015-08-02 DIAGNOSIS — H539 Unspecified visual disturbance: Secondary | ICD-10-CM | POA: Diagnosis not present

## 2015-08-02 DIAGNOSIS — R0602 Shortness of breath: Secondary | ICD-10-CM

## 2015-08-02 DIAGNOSIS — R0609 Other forms of dyspnea: Secondary | ICD-10-CM | POA: Diagnosis not present

## 2015-08-02 DIAGNOSIS — R0981 Nasal congestion: Secondary | ICD-10-CM | POA: Insufficient documentation

## 2015-08-02 DIAGNOSIS — F419 Anxiety disorder, unspecified: Secondary | ICD-10-CM | POA: Insufficient documentation

## 2015-08-02 DIAGNOSIS — F329 Major depressive disorder, single episode, unspecified: Secondary | ICD-10-CM | POA: Insufficient documentation

## 2015-08-02 DIAGNOSIS — R413 Other amnesia: Secondary | ICD-10-CM | POA: Diagnosis not present

## 2015-08-02 DIAGNOSIS — D72829 Elevated white blood cell count, unspecified: Secondary | ICD-10-CM

## 2015-08-02 DIAGNOSIS — R51 Headache: Secondary | ICD-10-CM | POA: Insufficient documentation

## 2015-08-02 DIAGNOSIS — Z792 Long term (current) use of antibiotics: Secondary | ICD-10-CM | POA: Insufficient documentation

## 2015-08-02 DIAGNOSIS — Z853 Personal history of malignant neoplasm of breast: Secondary | ICD-10-CM | POA: Insufficient documentation

## 2015-08-02 DIAGNOSIS — R0600 Dyspnea, unspecified: Secondary | ICD-10-CM

## 2015-08-02 DIAGNOSIS — Z79899 Other long term (current) drug therapy: Secondary | ICD-10-CM | POA: Diagnosis not present

## 2015-08-02 DIAGNOSIS — Z8709 Personal history of other diseases of the respiratory system: Secondary | ICD-10-CM | POA: Insufficient documentation

## 2015-08-02 DIAGNOSIS — E039 Hypothyroidism, unspecified: Secondary | ICD-10-CM | POA: Diagnosis not present

## 2015-08-02 DIAGNOSIS — Z87442 Personal history of urinary calculi: Secondary | ICD-10-CM | POA: Insufficient documentation

## 2015-08-02 DIAGNOSIS — R059 Cough, unspecified: Secondary | ICD-10-CM

## 2015-08-02 DIAGNOSIS — Z8701 Personal history of pneumonia (recurrent): Secondary | ICD-10-CM | POA: Insufficient documentation

## 2015-08-02 DIAGNOSIS — H9193 Unspecified hearing loss, bilateral: Secondary | ICD-10-CM | POA: Insufficient documentation

## 2015-08-02 DIAGNOSIS — R42 Dizziness and giddiness: Secondary | ICD-10-CM | POA: Insufficient documentation

## 2015-08-02 DIAGNOSIS — K219 Gastro-esophageal reflux disease without esophagitis: Secondary | ICD-10-CM | POA: Insufficient documentation

## 2015-08-02 DIAGNOSIS — H6502 Acute serous otitis media, left ear: Secondary | ICD-10-CM

## 2015-08-02 LAB — BASIC METABOLIC PANEL
Anion gap: 9 (ref 5–15)
BUN: 11 mg/dL (ref 6–20)
CALCIUM: 9.4 mg/dL (ref 8.9–10.3)
CO2: 22 mmol/L (ref 22–32)
CREATININE: 1 mg/dL (ref 0.44–1.00)
Chloride: 111 mmol/L (ref 101–111)
GFR calc non Af Amer: 56 mL/min — ABNORMAL LOW (ref >60)
Glucose, Bld: 93 mg/dL (ref 65–99)
Potassium: 4.3 mmol/L (ref 3.5–5.1)
SODIUM: 142 mmol/L (ref 135–145)

## 2015-08-02 LAB — CBC
HCT: 33 % — ABNORMAL LOW (ref 36.0–46.0)
Hemoglobin: 10.7 g/dL — ABNORMAL LOW (ref 12.0–15.0)
MCH: 27.8 pg (ref 26.0–34.0)
MCHC: 32.4 g/dL (ref 30.0–36.0)
MCV: 85.7 fL (ref 78.0–100.0)
PLATELETS: 171 10*3/uL (ref 150–400)
RBC: 3.85 MIL/uL — AB (ref 3.87–5.11)
RDW: 15.3 % (ref 11.5–15.5)
WBC: 22.3 10*3/uL — ABNORMAL HIGH (ref 4.0–10.5)

## 2015-08-02 LAB — POCT CBC
GRANULOCYTE PERCENT: 23.5 % — AB (ref 37–80)
HEMATOCRIT: 33.1 % — AB (ref 37.7–47.9)
HEMOGLOBIN: 11 g/dL — AB (ref 12.2–16.2)
Lymph, poc: 18.9 — AB (ref 0.6–3.4)
MCH, POC: 27.3 pg (ref 27–31.2)
MCHC: 33.3 g/dL (ref 31.8–35.4)
MCV: 82 fL (ref 80–97)
MID (cbc): 1.2 — AB (ref 0–0.9)
MPV: 7.2 fL (ref 0–99.8)
POC GRANULOCYTE: 6.2 (ref 2–6.9)
POC LYMPH %: 72 % — AB (ref 10–50)
POC MID %: 4.5 %M (ref 0–12)
Platelet Count, POC: 193 10*3/uL (ref 142–424)
RBC: 4.04 M/uL (ref 4.04–5.48)
RDW, POC: 15.5 %
WBC: 26.3 10*3/uL — AB (ref 4.6–10.2)

## 2015-08-02 LAB — BRAIN NATRIURETIC PEPTIDE: B Natriuretic Peptide: 66.7 pg/mL (ref 0.0–100.0)

## 2015-08-02 LAB — I-STAT TROPONIN, ED: TROPONIN I, POC: 0.01 ng/mL (ref 0.00–0.08)

## 2015-08-02 MED ORDER — ACETAMINOPHEN 325 MG PO TABS
650.0000 mg | ORAL_TABLET | Freq: Once | ORAL | Status: AC
  Administered 2015-08-02: 650 mg via ORAL
  Filled 2015-08-02: qty 2

## 2015-08-02 MED ORDER — AMOXICILLIN 500 MG PO CAPS: 500.0000 mg | ORAL_CAPSULE | Freq: Three times a day (TID) | ORAL | Status: DC

## 2015-08-02 NOTE — ED Notes (Signed)
Pt states she has had productive cough for 2 weeks now with shortness of breath with exertion. Pt was treated with z pack with no relief. Pt sent from urgent care with elevated WBC and continued cough. Pt also states for two days she has had a headache with on and off dizziness. Pt denies any chest pain. No dizziness or blurry vision at this time.
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
## 2015-08-02 NOTE — ED Notes (Signed)
Per Dr. Carlota Raspberry, patient coming from urgent care [place].  Patient was seen x 2 wks ago for cough, and chest xray came back clear.   Patient was given zpack and told to return to [facility] if continued problems.   He saw her again today with cough and increase WBC 26,000.   Patient has history of CLL and he is concerned with patient having HA's, blurry vision, and memory problems x several days.   Patient came in by private vehicle.

## 2015-08-02 NOTE — Discharge Instructions (Signed)
1. Medications: amoxicillin, usual home medications 2. Treatment: rest, drink plenty of fluids 3. Follow Up: please followup with your primary doctor and with ENT for discussion of your diagnoses and further evaluation after today's visit; please return to the ER for high fever, increased shortness of breath, severe headache, weakness, numbness, new or worsening symptoms   General Headache Without Cause A headache is pain or discomfort felt around the head or neck area. The specific cause of a headache may not be found. There are many causes and types of headaches. A few common ones are:  Tension headaches.  Migraine headaches.  Cluster headaches.  Chronic daily headaches. HOME CARE INSTRUCTIONS  Watch your condition for any changes. Take these steps to help with your condition: Managing Pain  Take over-the-counter and prescription medicines only as told by your health care provider.  Lie down in a dark, quiet room when you have a headache.  If directed, apply ice to the head and neck area:  Put ice in a plastic bag.  Place a towel between your skin and the bag.  Leave the ice on for 20 minutes, 2-3 times per day.  Use a heating pad or hot shower to apply heat to the head and neck area as told by your health care provider.  Keep lights dim if bright lights bother you or make your headaches worse. Eating and Drinking  Eat meals on a regular schedule.  Limit alcohol use.  Decrease the amount of caffeine you drink, or stop drinking caffeine. General Instructions  Keep all follow-up visits as told by your health care provider. This is important.  Keep a headache journal to help find out what may trigger your headaches. For example, write down:  What you eat and drink.  How much sleep you get.  Any change to your diet or medicines.  Try massage or other relaxation techniques.  Limit stress.  Sit up straight, and do not tense your muscles.  Do not use tobacco  products, including cigarettes, chewing tobacco, or e-cigarettes. If you need help quitting, ask your health care provider.  Exercise regularly as told by your health care provider.  Sleep on a regular schedule. Get 7-9 hours of sleep, or the amount recommended by your health care provider. SEEK MEDICAL CARE IF:   Your symptoms are not helped by medicine.  You have a headache that is different from the usual headache.  You have nausea or you vomit.  You have a fever. SEEK IMMEDIATE MEDICAL CARE IF:   Your headache becomes severe.  You have repeated vomiting.  You have a stiff neck.  You have a loss of vision.  You have problems with speech.  You have pain in the eye or ear.  You have muscular weakness or loss of muscle control.  You lose your balance or have trouble walking.  You feel faint or pass out.  You have confusion.   This information is not intended to replace advice given to you by your health care provider. Make sure you discuss any questions you have with your health care provider.   Document Released: 08/11/2005 Document Revised: 05/02/2015 Document Reviewed: 12/04/2014 Elsevier Interactive Patient Education 2016 [place] of Breath Shortness of breath means you have trouble breathing. It could also mean that you have a medical problem. You should get immediate medical care for shortness of breath. CAUSES   Not enough oxygen in the air such as with high altitudes or a smoke-filled room.  Certain lung diseases, infections, or problems.  Heart disease or conditions, such as angina or heart failure.  Low red blood cells (anemia).  Poor physical fitness, which can cause shortness of breath when you exercise.  Chest or back injuries or stiffness.  Being overweight.  Smoking.  Anxiety, which can make you feel like you are not getting enough air. DIAGNOSIS  Serious medical problems can often be found during your physical exam. Tests  may also be done to determine why you are having shortness of breath. Tests may include:  Chest X-rays.  Lung function tests.  Blood tests.  An electrocardiogram (ECG).  An ambulatory electrocardiogram. An ambulatory ECG records your heartbeat patterns over a 24-hour period.  Exercise testing.  A transthoracic echocardiogram (TTE). During echocardiography, sound waves are used to evaluate how blood flows through your heart.  A transesophageal echocardiogram (TEE).  Imaging scans. Your health care provider may not be able to find a cause for your shortness of breath after your exam. In this case, it is important to have a follow-up exam with your health care provider as directed.  TREATMENT  Treatment for shortness of breath depends on the cause of your symptoms and can vary greatly. HOME CARE INSTRUCTIONS   Do not smoke. Smoking is a common cause of shortness of breath. If you smoke, ask for help to quit.  Avoid being around chemicals or things that may bother your breathing, such as paint fumes and dust.  Rest as needed. Slowly resume your usual activities.  If medicines were prescribed, take them as directed for the full length of time directed. This includes oxygen and any inhaled medicines.  Keep all follow-up appointments as directed by your health care provider. SEEK MEDICAL CARE IF:   Your condition does not improve in the time expected.  You have a hard time doing your normal activities even with rest.  You have any new symptoms. SEEK IMMEDIATE MEDICAL CARE IF:   Your shortness of breath gets worse.  You feel light-headed, faint, or develop a cough not controlled with medicines.  You start coughing up blood.  You have pain with breathing.  You have chest pain or pain in your arms, shoulders, or abdomen.  You have a fever.  You are unable to walk up stairs or exercise the way you normally do. MAKE SURE YOU:  Understand these instructions.  Will watch  your condition.  Will get help right away if you are not doing well or get worse.   This information is not intended to replace advice given to you by your health care provider. Make sure you discuss any questions you have with your health care provider.   Document Released: 05/06/2001 Document Revised: 08/16/2013 Document Reviewed: 10/27/2011 Elsevier Interactive Patient Education [employer].

## 2015-08-02 NOTE — Patient Instructions (Addendum)
I will have you evaluated through the emergency room at [facility] to determine the next step in evaluation of your symptoms and elevated white blood cell count. They may choose to do some other imaging such as CT scans of either head or sinuses or possibly chest based on your persistent cough and elevated white blood cell count, but that'll be determined by the provider treating you in the emergency room. I did send off a heart failure test, but that is less likely the cause of your cough. As far as your trouble with feeling short of breath with exertion, would recommend following up with your cardiologist to discuss this further as this has been going on for some time.  You can also discuss the blurry vision episodes and headaches that you have been having with the emergency department provider to determine further workup if needed, but it sounds as if the memory symptoms have been going on for some time, so this may need to be worked up further by your primary care provider.  The clicking or popping your ears may be due to something called serous otitis, see information below. Depending on your evaluation through the emergency room, you can try over-the-counter Flonase which is a steroid nasal spray that may help if there is an allergy component your symptoms.   I called the triage nurse at [facility], so they are expecting you.   Serous Otitis Media Serous otitis media is fluid in the middle ear space. This space contains the bones for hearing and air. Air in the middle ear space helps to transmit sound.  The air gets there through the eustachian tube. This tube goes from the back of the nose (nasopharynx) to the middle ear space. It keeps the pressure in the middle ear the same as the outside world. It also helps to drain fluid from the middle ear space. CAUSES  Serous otitis media occurs when the eustachian tube gets blocked. Blockage can come from:  Ear infections.  Colds and other  upper respiratory infections.  Allergies.  Irritants such as cigarette smoke.  Sudden changes in air pressure (such as descending in an airplane).  Enlarged adenoids.  A mass in the nasopharynx. During colds and upper respiratory infections, the middle ear space can become temporarily filled with fluid. This can happen after an ear infection also. Once the infection clears, the fluid will generally drain out of the ear through the eustachian tube. If it does not, then serous otitis media occurs. SIGNS AND SYMPTOMS   Hearing loss.  A feeling of fullness in the ear, without pain.  Young children may not show any symptoms but may show slight behavioral changes, such as agitation, ear pulling, or crying. DIAGNOSIS  Serous otitis media is diagnosed by an ear exam. Tests may be done to check on the movement of the eardrum. Hearing exams may also be done. TREATMENT  The fluid most often goes away without treatment. If allergy is the cause, allergy treatment may be helpful. Fluid that persists for several months may require minor surgery. A small tube is placed in the eardrum to:  Drain the fluid.  Restore the air in the middle ear space. In certain situations, antibiotic medicines are used to avoid surgery. Surgery may be done to remove enlarged adenoids (if this is the cause). HOME CARE INSTRUCTIONS   Keep children away from tobacco smoke.  Keep all follow-up visits as directed by your health care provider. SEEK MEDICAL CARE IF:  Your hearing is not better in 3 months.  Your hearing is worse.  You have ear pain.  You have drainage from the ear.  You have dizziness.  You have serous otitis media only in one ear or have any bleeding from your nose (epistaxis).  You notice a lump on your neck. MAKE SURE YOU:  Understand these instructions.   Will watch your condition.   Will get help right away if you are not doing well or get worse.    This information is not  intended to replace advice given to you by your health care provider. Make sure you discuss any questions you have with your health care provider.   Document Released: 11/01/2003 Document Revised: 09/01/2014 Document Reviewed: 03/08/2013 Elsevier Interactive Patient Education [employer].

## 2015-08-02 NOTE — ED Provider Notes (Signed)
CSN: [id number]     Arrival date & time 08/02/15  1438 History   First MD Initiated Contact with Patient 08/02/15 1637     Chief Complaint  Patient presents with  . Headache  . Shortness of Breath    HPI   [name] [name] is a 69 y.o. female with a PMH of HLD, hypothyroidism, anxiety, depression, fibromyalgia, breast cancer, chronic back pain who presents to the ED with frontal headache, nasal congestion, non-productive cough, and shortness of breath x 2 weeks. She states her headache has been constant. She reports associated blurry vision and intermittent dizziness. She denies exacerbating or alleviating factors. She states she was treated for a URI with azithromycin, however has had persistent symptoms. She was evaluated at urgent care today, and was subsequently sent to the ED for elevated WBC count. She denies fever, chills, chest pain, abdominal pain, N/V/D/C, dysuria, urgency, frequency, numbness, weakness, paresthesia.  Past Medical History  Diagnosis Date  . Sinus tachycardia (HCC)     mild resting  . HLD (hyperlipidemia)   . Orthostatic hypotension     slight in the past  . Anxiety   . Depression   . Diverticulosis   . Fibromyalgia 1978  . Hypothyroidism   . Alopecia   . Ruptured disk     one in neck and two in back  . Plantar fasciitis   . GERD (gastroesophageal reflux disease)   . History of syncope     episode 2008--  no recurrence since  . Ureteral calculi     bilateral  . Leukocytosis   . Chronic back pain     "lower back and upper neck" (07/28/2013)  . 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 ear   . Urgency of urination   . Hematuria   . Sensation of pressure in bladder area   . Wears glasses   . Ecchymosis     FROM IV AND LAB WORK THE PAST WEEK  06-17-2013  . Stool incontinence     "at times recently"   . Kidney stones 2014  . CAP (community acquired pneumonia)     admission 06-04-2013 secondary to failed oral antibitotics---  last cxr 06-16-2013  much improved  . 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 Breast cancer ([place]) 1997; 2014    right  . Ejection fraction   . SOB (shortness of breath) 06-17-2013 CURRENTLY RESIDUAL FROM RECENT CAP    new since sore throats- 12/2014    . Arthritis     "spine" (07/28/2013)  . DDD (degenerative disc disease)   . Deafness in left ear   . S/P chemotherapy, time since greater than 12 weeks    Past Surgical History  Procedure Laterality Date  . Lumbar epidural injection      has had 7 injections  . Total abdominal hysterectomy w/ bilateral salpingoophorectomy  1997  . Breast lumpectomy with axillary lymph node dissection Right 1997  . Transthoracic echocardiogram  05-11-2007  dr Ron [place]    normal lvf/  ef 65%  . Tonsillectomy  AGE 58  . Retinal detachment surgery Right 2013  . Cystoscopy with retrograde pyelogram, ureteroscopy and stent placement Bilateral 06/17/2013    Procedure: CYSTOSCOPY WITH RETROGRADE PYELOGRAM, URETEROSCOPY AND LEFT DOUBLE  J STENT PLACEMENT RIGHT URETERAL HOLMIIUM LASER AND DOUBLE J STENT ;  Surgeon: Hanley Ben, MD;  Location: [place];  Service: Urology;  Laterality: Bilateral;  . Holmium  laser application Bilateral [id number]    Procedure: HOLMIUM LASER APPLICATION;  Surgeon: Hanley Ben, MD;  Location: [place];  Service: Urology;  Laterality: Bilateral;  . Mastectomy w/ nodes partial Right 1997  . Mastectomy complete / simple Right 07/28/2013  . Appendectomy  1997  . Breast biopsy Right 2014  . Cystoscopy with ureteroscopy, stone basketry and stent placement Bilateral 2014  . Lithotripsy Left ~ 06/2013  . Simple mastectomy with axillary sentinel node biopsy Right 07/28/2013    Procedure: RIGHT TOTAL  MASTECTOMY;  Surgeon: Imogene Burn. Georgette Dover, MD;  Location: [place];  Service: General;  Laterality: Right;  . Portacath placement Left 07/28/2013    Procedure: ATTEMPTED INSERTION  PORT-A-CATH;  Surgeon: Imogene Burn. Georgette Dover, MD;  Location: [place];  Service: General;  Laterality: Left;  . Port-a-cath removal Left 10/03/2014    Procedure: REMOVAL PORT-A-CATH;  Surgeon: Donnie Mesa, MD;  Location: [place];  Service: General;  Laterality: Left;  . Abdominal hysterectomy    . Robotic assited partial nephrectomy Right 02/05/2015    Procedure: ROBOTIC ASSITED PARTIAL NEPHRECTOMY;  Surgeon: Raynelle Bring, MD;  Location: [facility];  Service: Urology;  Laterality: Right;   Family History  Problem Relation Age of Onset  . Heart disease Maternal Grandfather   . Diabetes Maternal Grandfather   . Colon cancer Maternal Aunt 79  . Brain cancer Paternal Grandmother     dx in 36s  . Dementia Mother   . Diabetes Mother   . Osteoporosis Mother   . Diabetes Maternal Aunt   . Prostate cancer Father 83  . Bipolar disorder Maternal Aunt   . Stroke Maternal Aunt   . Stomach cancer Paternal Uncle     dx in late 46s   Social History  Substance Use Topics  . Smoking status: Never Smoker   . Smokeless tobacco: Never Used  . Alcohol Use: No     Comment: 07/28/2013 "no alcohol since 1994; never had problem w/it"   OB History    Gravida Para Term Preterm AB TAB SAB Ectopic Multiple Living   1 1        1       Review of Systems  Constitutional: Negative for fever and chills.  HENT: Positive for congestion.   Eyes: Positive for visual disturbance.  Respiratory: Positive for cough and shortness of breath.   Cardiovascular: Negative for chest pain.  Gastrointestinal: Negative for nausea, vomiting, abdominal pain, diarrhea and constipation.  Genitourinary: Negative for dysuria, urgency and frequency.  Neurological: Positive for dizziness and headaches. Negative for syncope, weakness, light-headedness and numbness.  All other systems reviewed and are negative.     Allergies  Lasix; Lithium; Sulfa antibiotics; Trazodone and nefazodone; and Lyrica  Home Medications    Prior to Admission medications   Medication Sig Start Date End Date Taking? Authorizing Provider  ALPRAZolam (XANAX) 0.5 MG tablet TAKE 1 TABLET EVERY 8 HOURS AS NEEDED Patient taking differently: TAKE 1 TABLET BY MOUTH EVERY TWICE DAILY.   Yes Darlyne Russian, MD  amitriptyline (ELAVIL) 150 MG tablet Take 150 mg by mouth at bedtime.   Yes Historical Provider, MD  anastrozole (ARIMIDEX) 1 MG tablet TAKE 1 TABLET (1 MG TOTAL) BY MOUTH DAILY. 05/03/15  Yes Chauncey Cruel, MD  aspirin 81 MG tablet Take 81 mg by mouth daily.   Yes Historical Provider, MD  buPROPion (WELLBUTRIN XL) 150 MG 24 hr tablet Take 150 mg by mouth daily. 07/31/15  Yes Historical Provider,  MD  Cholecalciferol (VITAMIN D-3 PO) Take 1 capsule by mouth daily.    Yes Historical Provider, MD  levothyroxine (SYNTHROID, LEVOTHROID) 50 MCG tablet TAKE 1 TABLET (50 MCG TOTAL) BY MOUTH DAILY BEFORE BREAKFAST. 05/07/15  Yes Megan Salon, MD  metoprolol succinate (TOPROL-XL) 25 MG 24 hr tablet TAKE 1 TABLET (25 MG TOTAL) BY MOUTH DAILY. 02/07/15  Yes Carlena Bjornstad, MD  minoxidil (ROGAINE) 2 % external solution Apply 1 application topically 2 (two) times daily as needed (hair.).    Yes Historical Provider, MD  Multiple Vitamins-Minerals (HAIR/SKIN/NAILS PO) Take 1 tablet by mouth daily.   Yes Historical Provider, MD  Omega-3 Fatty Acids (FISH OIL PO) Take 1 capsule by mouth daily.    Yes Historical Provider, MD  omeprazole (PRILOSEC) 40 MG capsule TAKE ONE CAPSULE BY MOUTH EVERY MORNING 06/25/15  Yes Chauncey Cruel, MD  REXULTI 0.5 MG TABS Take 0.5 mg by mouth at bedtime.  11/05/14  Yes Historical Provider, MD  VIIBRYD 40 MG TABS Take 40 mg by mouth daily.  11/13/14  Yes Historical Provider, MD  amoxicillin (AMOXIL) 500 MG capsule Take 1 capsule (500 mg total) by mouth 3 (three) times daily. 08/02/15   Marella Chimes, PA-C    BP 137/72 mmHg  Pulse 82  Temp(Src) 98 F (36.7 C) (Oral)  Resp 16  Ht 5\' 4"  (1.626 m)  Wt 89.812 kg   BMI 33.97 kg/m2  SpO2 98%  LMP 08/26/1995 Physical Exam  Constitutional: She is oriented to person, place, and time. She appears well-developed and well-nourished. No distress.  HENT:  Head: Normocephalic and atraumatic.  Right Ear: External ear normal.  Left Ear: External ear normal.  Nose: Nose normal.  Mouth/Throat: Uvula is midline, oropharynx is clear and moist and mucous membranes are normal.  Eyes: Conjunctivae, EOM and lids are normal. Pupils are equal, round, and reactive to light. Right eye exhibits no discharge. Left eye exhibits no discharge. No scleral icterus.  Neck: Normal range of motion. Neck supple.  Cardiovascular: Normal rate, regular rhythm, normal heart sounds, intact distal pulses and normal pulses.   Pulmonary/Chest: Effort normal and breath sounds normal. No respiratory distress. She has no wheezes. She has no rales.  Abdominal: Soft. Normal appearance and bowel sounds are normal. She exhibits no distension and no mass. There is no tenderness. There is no rigidity, no rebound and no guarding.  Musculoskeletal: Normal range of motion. She exhibits no edema or tenderness.  Neurological: She is alert and oriented to person, place, and time. She has normal strength. No cranial nerve deficit or sensory deficit. Coordination normal.  Skin: Skin is warm, dry and intact. No rash noted. She is not diaphoretic. No erythema. No pallor.  Psychiatric: She has a normal mood and affect. Her speech is normal and behavior is normal.  Nursing note and vitals reviewed.   ED Course  Procedures (including critical care time)  Labs Review Labs Reviewed  BASIC METABOLIC PANEL - Abnormal; Notable for the following:    GFR calc non Af Amer 56 (*)    All other components within normal limits  CBC - Abnormal; Notable for the following:    WBC 22.3 (*)    RBC 3.85 (*)    Hemoglobin 10.7 (*)    HCT 33.0 (*)    All other components within normal limits  BRAIN NATRIURETIC PEPTIDE   I-STAT TROPOININ, ED    Imaging Review Dg Chest 2 View  08/02/2015  CLINICAL DATA:  Shortness of Breath EXAM: CHEST  2 VIEW COMPARISON:  07/13/2015 FINDINGS: Cardiomediastinal silhouette is stable. Mild elevation of the right hemidiaphragm again noted. No acute infiltrate or pleural effusion. No pulmonary edema. Stable left base laterally atelectasis or scarring. IMPRESSION: No active cardiopulmonary disease. Electronically Signed   By: Lahoma Crocker M.D.   On: 08/02/2015 13:04   Ct Head Wo Contrast  08/02/2015  CLINICAL DATA:  Headaches for 2 days EXAM: CT HEAD WITHOUT CONTRAST TECHNIQUE: Contiguous axial images were obtained from the base of the skull through the vertex without intravenous contrast. COMPARISON:  None. FINDINGS: Bony calvarium is intact. Mild atrophic changes are seen. No findings to suggest acute hemorrhage, acute infarction or space-occupying mass lesion are noted. IMPRESSION: Diffuse atrophic changes without acute abnormality. Electronically Signed   By: Inez Catalina M.D.   On: 08/02/2015 18:32   I have personally reviewed and evaluated these images and lab results as part of my medical decision-making.   EKG Interpretation   Date/Time:  Thursday August 02 2015 14:52:06 EST Ventricular Rate:  90 PR Interval:  146 QRS Duration: 76 QT Interval:  334 QTC Calculation: 408 R Axis:   23 Text Interpretation:  Normal sinus rhythm Normal ECG No significant change  since last tracing Confirmed by NANAVATI, MD, Thelma Comp [phone]) on 08/02/2015  5:03:47 PM      MDM   Final diagnoses:  Headache  Shortness of breath  Leukocytosis    69 year old female presents with frontal headache, nasal congestion, non-productive cough, and shortness of breath x 2 weeks. Reports associated blurry vision and intermittent dizziness. Denies fever, chills, chest pain, abdominal pain, N/V/D/C, dysuria, urgency, frequency, numbness, weakness, paresthesia. Patient was evaluated at [facility] today prior to  arrival, and sent to the ED for further evaluation and management given leukocytosis. CXR at that time negative for active cardiopulmonary disease.  Patient is afebrile. Vital signs stable. Normal neuro exam with no focal deficit. Heart RRR. Lungs clear to auscultation bilaterally. Abdomen soft, non-tender, non-distended. No lower extremity edema.  Will obtain labs as above and imaging of head given headache and history of malignancy.   Patient given tylenol for headache (requesting something PO). EKG normal sinus rhythm, HR 90. Troponin negative. CBC remarkable for leukocytosis of 22.3, hemoglobin 10.7. BMP unremarkable. BNP within normal limits. Head CT negative for acute abnormality.  Patient discussed with and seen by Dr. Kathrynn Humble. Patient is non-toxic and well-appearing, feel she is stable for discharge at this time. Leukocytosis likely due to patient's history of malignancy. Low suspicion for ACS given chronicity of symptoms and unremarkable work-up in the ED. No tachycardia, tachypnea, hypoxia, EKG findings to suggest PE. Patient able to maintain O2 sat 97% while ambulating. Will treat for possible sinusitis with amoxicillin. Patient to follow-up with PCP and with ENT. Return precautions discussed. Patient verbalizes her understanding and is in agreement with plan.  BP 137/72 mmHg  Pulse 82  Temp(Src) 98 F (36.7 C) (Oral)  Resp 16  Ht 5\' 4"  (1.626 m)  Wt 89.812 kg  BMI 33.97 kg/m2  SpO2 98%  LMP 08/26/1995     Marella Chimes, PA-C 08/02/15 2322  Varney Biles, MD 08/03/15 0031

## 2015-08-02 NOTE — ED Notes (Signed)
Pt verbalized understanding of d/c instructions to take antibiotic and to follow up with pcp and ENT dr. Pt stable, ambulatory, pain free, and NAD upon d/c.

## 2015-08-03 LAB — BRAIN NATRIURETIC PEPTIDE: BRAIN NATRIURETIC PEPTIDE: 37.9 pg/mL (ref 0.0–100.0)

## 2015-08-06 ENCOUNTER — Ambulatory Visit: Payer: Self-pay | Admitting: Obstetrics & Gynecology

## 2015-08-06 ENCOUNTER — Telehealth: Payer: Self-pay | Admitting: Obstetrics & Gynecology

## 2015-08-06 NOTE — Telephone Encounter (Signed)
Patient cancelled appointment today. See staff message °

## 2015-08-21 ENCOUNTER — Ambulatory Visit: Payer: Medicare Other | Admitting: Emergency Medicine

## 2015-08-24 ENCOUNTER — Ambulatory Visit: Payer: Self-pay | Admitting: Obstetrics & Gynecology

## 2015-08-31 ENCOUNTER — Telehealth: Payer: Self-pay | Admitting: Oncology

## 2015-08-31 NOTE — Telephone Encounter (Signed)
Called and rescheduled her appt from dr gm to heather due to being out of the office 10/25/15

## 2015-09-04 ENCOUNTER — Telehealth: Payer: Self-pay | Admitting: Obstetrics & Gynecology

## 2015-09-04 NOTE — Telephone Encounter (Signed)
Left patient a message to call back to reschedule a future appointment that was cancelled by the provider. °

## 2015-09-10 ENCOUNTER — Ambulatory Visit: Payer: Self-pay | Admitting: Obstetrics & Gynecology

## 2015-09-11 ENCOUNTER — Ambulatory Visit: Payer: Self-pay | Admitting: Obstetrics & Gynecology

## 2015-09-12 ENCOUNTER — Telehealth: Payer: Self-pay | Admitting: *Deleted

## 2015-09-12 DIAGNOSIS — C9110 Chronic lymphocytic leukemia of B-cell type not having achieved remission: Secondary | ICD-10-CM

## 2015-09-12 DIAGNOSIS — C50011 Malignant neoplasm of nipple and areola, right female breast: Secondary | ICD-10-CM

## 2015-09-12 MED ORDER — ANASTROZOLE 1 MG PO TABS: ORAL_TABLET | ORAL | Status: DC

## 2015-09-12 MED ORDER — OMEPRAZOLE 40 MG PO CPDR: 40.0000 mg | DELAYED_RELEASE_CAPSULE | Freq: Every morning | ORAL | Status: DC

## 2015-09-12 NOTE — Telephone Encounter (Signed)
"  My insurance now requires I no longer use [facility].  Please send refills for anastrozole and omeprazole to [facility] in [place].  I also need to know if you all prescribe pain medicines.  [place], I laid on my stomach on the couch with my neck up looking at he birds for about fifteen minutes.  I couldn't get off thje couch without going omn my knees from the couch and pushing up.  I have pain to my back and can't walk without sitting down now every few minutes.  I have history of bulging disc in my neck and lower back.  Used to go to preferred pain management " Denies constipation with averaging three BM's every morning and loose stools occasionally.  Has not tried anything over the counter.  This nurse advised contacting PCP, try a heating pad, ibuprofen or aleve.  Asked that I "notify Gentry Fitz, there was a time she was going to prescribe hydrocodone for me."

## 2015-09-12 NOTE — Telephone Encounter (Signed)
Spoke pt about her ongoing back pain. I advised pt to either go back to her pain management doctor where she received the epidural injections for her back pain. Pt told me the last time she's been there was a year ago. Pt also mentioned that they had put her on Hydrocodone but it didn't do much good. Pt will see Dr. [name] on Jan.31st and I suggested to mention to him about the ongoing pain. She wants to be referred to a neurologist or orthopedic doctor. I told her that her PCP will be able to do this. I told her we don't prescribe pain medication for non- metastatic cancer patients. She verbalized understanding. She will continue with heat and ice to back area and use Aleve. Talked to pt for 20 minutes. Message to be fwd to Gentry Fitz, NP.

## 2015-09-12 NOTE — Telephone Encounter (Signed)
We do not prescribe narcotics for non metastatic patients. She may try alternating tylenol and ibuprofen/aleve or she may return to her pain management specialist. If she would like a referral to [facility] this may be arranged as well.

## 2015-09-25 ENCOUNTER — Ambulatory Visit (INDEPENDENT_AMBULATORY_CARE_PROVIDER_SITE_OTHER): Payer: Medicare Other

## 2015-09-25 ENCOUNTER — Ambulatory Visit (INDEPENDENT_AMBULATORY_CARE_PROVIDER_SITE_OTHER): Payer: Medicare Other | Admitting: Emergency Medicine

## 2015-09-25 ENCOUNTER — Encounter: Payer: Self-pay | Admitting: Emergency Medicine

## 2015-09-25 ENCOUNTER — Telehealth: Payer: Self-pay | Admitting: *Deleted

## 2015-09-25 VITALS — BP 142/78 | HR 102 | Temp 99.3°F | Resp 16 | Ht 64.0 in | Wt 200.4 lb

## 2015-09-25 DIAGNOSIS — R0609 Other forms of dyspnea: Secondary | ICD-10-CM | POA: Diagnosis not present

## 2015-09-25 DIAGNOSIS — R0602 Shortness of breath: Secondary | ICD-10-CM | POA: Diagnosis not present

## 2015-09-25 DIAGNOSIS — R059 Cough, unspecified: Secondary | ICD-10-CM

## 2015-09-25 DIAGNOSIS — R0600 Dyspnea, unspecified: Secondary | ICD-10-CM

## 2015-09-25 DIAGNOSIS — R05 Cough: Secondary | ICD-10-CM

## 2015-09-25 LAB — CBC WITH DIFFERENTIAL/PLATELET
Basophils Absolute: 0 10*3/uL (ref 0.0–0.1)
Basophils Relative: 0 % (ref 0–1)
EOS PCT: 2 % (ref 0–5)
Eosinophils Absolute: 0.6 10*3/uL (ref 0.0–0.7)
HEMATOCRIT: 36.3 % (ref 36.0–46.0)
HEMOGLOBIN: 11.8 g/dL — AB (ref 12.0–15.0)
LYMPHS PCT: 74 % — AB (ref 12–46)
Lymphs Abs: 21.2 10*3/uL — ABNORMAL HIGH (ref 0.7–4.0)
MCH: 26.9 pg (ref 26.0–34.0)
MCHC: 32.5 g/dL (ref 30.0–36.0)
MCV: 82.9 fL (ref 78.0–100.0)
MONO ABS: 1.7 10*3/uL — AB (ref 0.1–1.0)
MONOS PCT: 6 % (ref 3–12)
MPV: 10.4 fL (ref 8.6–12.4)
Neutro Abs: 5.1 10*3/uL (ref 1.7–7.7)
Neutrophils Relative %: 18 % — ABNORMAL LOW (ref 43–77)
Platelets: 238 10*3/uL (ref 150–400)
RBC: 4.38 MIL/uL (ref 3.87–5.11)
RDW: 15.2 % (ref 11.5–15.5)
WBC: 28.6 10*3/uL — AB (ref 4.0–10.5)

## 2015-09-25 LAB — D-DIMER, QUANTITATIVE: D-Dimer, Quant: 0.28 ug/mL-FEU (ref 0.00–0.48)

## 2015-09-25 MED ORDER — DOXYCYCLINE HYCLATE 100 MG PO TABS: 100.0000 mg | ORAL_TABLET | Freq: Two times a day (BID) | ORAL | Status: DC

## 2015-09-25 NOTE — Telephone Encounter (Signed)
Spoke with Dr Everlene Farrier about the pt results of the d dimer today.  Per Dr. Everlene Farrier, results are within normal limits.  i have called and lmomtcb for the pt so we can discuss these results with her.

## 2015-09-25 NOTE — Progress Notes (Addendum)
Patient ID: [name] [name], female   DOB: [birth year], 70 y.o.   MRN: [id number]     By signing my name below, I, [name] [name], attest that this documentation has been prepared under the direction and in the presence of [name], MD.  Electronically Signed: Zola [name], Medical Scribe. 09/25/2015. 11:54 AM.   Chief Complaint:  Chief Complaint  Patient presents with  . Shortness of Breath    x 2 months all SXs  . Fatigue  . Cough    per patient greenish, goldish mucus  . Back Pain    HPI: [name] [name] is a 70 y.o. female with a history of chronic lymphocytic leukemia who reports to [facility] today for a follow-up.   Patient is complaining of intermittent SOB over the past month. This is her primary complaint. She notes that she is usually able to walk to her car without any problems, but will sometimes be SOB with physical exertion. She notices the SOB more at home. She still has some abdominal pain. Patient denies history of blood clot. She does have a history of sinus tachycardia.  Patient still has a productive cough with green/gold sputum. She has not had the Prevnar and has not had the flu shot this year. She was seen by Dr. Carlota Raspberry on 08/02/15 and on 07/13/15 for the cough and other URI symptoms; her symptoms have still not resolved. She had a CT of her head done on 08/03/15.  Patient has a history of cancer (4 times) and is followed by Dr. Jana Hakim.  Past Medical History  Diagnosis Date  . Sinus tachycardia (HCC)     mild resting  . HLD (hyperlipidemia)   . Orthostatic hypotension     slight in the past  . Anxiety   . Depression   . Diverticulosis   . Fibromyalgia 1978  . Hypothyroidism   . Alopecia   . Ruptured disk     one in neck and two in back  . Plantar fasciitis   . GERD (gastroesophageal reflux disease)   . History of syncope     episode 2008--  no recurrence since  . Ureteral calculi     bilateral  . Leukocytosis   . Chronic back pain     "lower back and  upper neck" (07/28/2013)  . 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 ear   . Urgency of urination   . Hematuria   . Sensation of pressure in bladder area   . Wears glasses   . Ecchymosis     FROM IV AND LAB WORK THE PAST WEEK  06-17-2013  . Stool incontinence     "at times recently"   . Kidney stones 2014  . CAP (community acquired pneumonia)     admission 06-04-2013 secondary to failed oral antibitotics---  last cxr 06-16-2013  much improved  . CLL (chronic lymphocytic leukemia) ([place]) dx'd 05/2013    "I don't think I have it though" (07/28/2013)  . Breast cancer ([place]) 1997; 2014    right  . Ejection fraction   . SOB (shortness of breath) 06-17-2013 CURRENTLY RESIDUAL FROM RECENT CAP    new since sore throats- 12/2014    . Arthritis     "spine" (07/28/2013)  . DDD (degenerative disc disease)   . Deafness in left ear   . S/P chemotherapy, time since greater than 12 weeks    Past Surgical History  Procedure Laterality Date  . Lumbar epidural injection      has had 7 injections  .  Total abdominal hysterectomy w/ bilateral salpingoophorectomy  1997  . Breast lumpectomy with axillary lymph node dissection Right 1997  . Transthoracic echocardiogram  05-11-2007  dr Ron Parker    normal lvf/  ef 65%  . Tonsillectomy  AGE 89  . Retinal detachment surgery Right 2013  . Cystoscopy with retrograde pyelogram, ureteroscopy and stent placement Bilateral 06/17/2013    Procedure: CYSTOSCOPY WITH RETROGRADE PYELOGRAM, URETEROSCOPY AND LEFT DOUBLE  J STENT PLACEMENT RIGHT URETERAL HOLMIIUM LASER AND DOUBLE J STENT ;  Surgeon: Hanley Ben, MD;  Location: [place];  Service: Urology;  Laterality: Bilateral;  . Holmium laser application Bilateral [id number]    Procedure: HOLMIUM LASER APPLICATION;  Surgeon: Hanley Ben, MD;  Location: [place];  Service: Urology;  Laterality: Bilateral;  . Mastectomy w/ nodes partial Right 1997  . Mastectomy complete / simple Right 07/28/2013  .  Appendectomy  1997  . Breast biopsy Right 2014  . Cystoscopy with ureteroscopy, stone basketry and stent placement Bilateral 2014  . Lithotripsy Left ~ 06/2013  . Simple mastectomy with axillary sentinel node biopsy Right 07/28/2013    Procedure: RIGHT TOTAL  MASTECTOMY;  Surgeon: Imogene Burn. Georgette Dover, MD;  Location: [place];  Service: General;  Laterality: Right;  . Portacath placement Left 07/28/2013    Procedure: ATTEMPTED INSERTION PORT-A-CATH;  Surgeon: Imogene Burn. Georgette Dover, MD;  Location: [place];  Service: General;  Laterality: Left;  . Port-a-cath removal Left 10/03/2014    Procedure: REMOVAL PORT-A-CATH;  Surgeon: Donnie Mesa, MD;  Location: [place];  Service: General;  Laterality: Left;  . Abdominal hysterectomy    . Robotic assited partial nephrectomy Right 02/05/2015    Procedure: ROBOTIC ASSITED PARTIAL NEPHRECTOMY;  Surgeon: Raynelle Bring, MD;  Location: [facility];  Service: Urology;  Laterality: Right;   Social History   Social History  . Marital Status: Married    Spouse Name: N/A  . Number of Children: 1  . Years of Education: N/A   Occupational History  . homemaker    Social History Main Topics  . Smoking status: Never Smoker   . Smokeless tobacco: Never Used  . Alcohol Use: No     Comment: 07/28/2013 "no alcohol since 1994; never had problem w/it"  . Drug Use: No  . Sexual Activity: No     Comment: TAH/BSO   Other Topics Concern  . None   Social History Narrative   Family History  Problem Relation Age of Onset  . Heart disease Maternal Grandfather   . Diabetes Maternal Grandfather   . Colon cancer Maternal Aunt 79  . Brain cancer Paternal Grandmother     dx in 54s  . Dementia Mother   . Diabetes Mother   . Osteoporosis Mother   . Diabetes Maternal Aunt   . Prostate cancer Father 89  . Bipolar disorder Maternal Aunt   . Stroke Maternal Aunt   . Stomach cancer Paternal Uncle     dx in late 35s   Allergies  Allergen Reactions  . Lasix  [Furosemide] Nausea And Vomiting and Other (See Comments)    headache  . Lithium Other (See Comments)    Dizziness, "cause me to fall"  . Sulfa Antibiotics Hives  . Trazodone And Nefazodone Other (See Comments)    insomnia  . Lyrica [Pregabalin] Diarrhea, Nausea Only and Rash   Prior to Admission medications   Medication Sig Start Date End Date Taking? Authorizing Provider  ALPRAZolam Duanne Moron) 0.5 MG tablet TAKE 1  TABLET EVERY 8 HOURS AS NEEDED Patient taking differently: TAKE 1 TABLET BY MOUTH EVERY TWICE DAILY.   Yes Darlyne Russian, MD  amitriptyline (ELAVIL) 150 MG tablet Take 150 mg by mouth at bedtime.   Yes Historical Provider, MD  anastrozole (ARIMIDEX) 1 MG tablet TAKE 1 TABLET (1 MG TOTAL) BY MOUTH DAILY. 09/12/15  Yes Chauncey Cruel, MD  aspirin 81 MG tablet Take 81 mg by mouth daily.   Yes Historical Provider, MD  levothyroxine (SYNTHROID, LEVOTHROID) 50 MCG tablet TAKE 1 TABLET (50 MCG TOTAL) BY MOUTH DAILY BEFORE BREAKFAST. 05/07/15  Yes Megan Salon, MD  metoprolol succinate (TOPROL-XL) 25 MG 24 hr tablet TAKE 1 TABLET (25 MG TOTAL) BY MOUTH DAILY. 02/07/15  Yes Carlena Bjornstad, MD  minoxidil (ROGAINE) 2 % external solution Apply 1 application topically 2 (two) times daily as needed (hair.).    Yes Historical Provider, MD  Multiple Vitamins-Minerals (HAIR/SKIN/NAILS PO) Take 1 tablet by mouth daily.   Yes Historical Provider, MD  Omega-3 Fatty Acids (FISH OIL PO) Take 1 capsule by mouth daily.    Yes Historical Provider, MD  omeprazole (PRILOSEC) 40 MG capsule Take 1 capsule (40 mg total) by mouth every morning. 09/12/15  Yes Chauncey Cruel, MD  REXULTI 0.5 MG TABS Take 0.5 mg by mouth at bedtime.  11/05/14  Yes Historical Provider, MD  VIIBRYD 40 MG TABS Take 40 mg by mouth daily.  11/13/14  Yes Historical Provider, MD  amoxicillin (AMOXIL) 500 MG capsule Take 1 capsule (500 mg total) by mouth 3 (three) times daily. Patient not taking: Reported on 09/25/2015 08/02/15   Marella Chimes, PA-C  buPROPion (WELLBUTRIN XL) 150 MG 24 hr tablet Take 150 mg by mouth daily. Reported on 09/25/2015 07/31/15   Historical Provider, MD  Cholecalciferol (VITAMIN D-3 PO) Take 1 capsule by mouth daily. Reported on 09/25/2015    Historical Provider, MD     ROS: The patient denies fevers, chills, night sweats, unintentional weight loss, chest pain, wheezing, nausea, vomiting, dysuria, hematuria, melena.  All other systems have been reviewed and were otherwise negative with the exception of those mentioned in the HPI and as above.    PHYSICAL EXAM: Filed Vitals:   09/25/15 1123 09/25/15 1131  BP: 145/82 142/78  Pulse: 102   Temp: 99.3 F (37.4 C)   Resp: 16    Body mass index is 34.38 kg/(m^2).   General: Alert, no acute distress, non-toxic. HEENT:  Normocephalic, atraumatic, oropharynx patent. Eye: [facility] Cardiovascular:  Regular rate and rhythm, no rubs murmurs or gallops.  No Carotid bruits, radial pulse intact. No pedal edema.  Respiratory: Coarse rhonchi, both lungs. Abdominal: No organomegaly, abdomen is soft and non-tender, positive bowel sounds.  No masses. Musculoskeletal: Gait intact. No calf tenderness Skin: No rashes. Neurologic: Facial musculature symmetric. Psychiatric: Patient acts appropriately throughout our interaction. Lymphatic: No cervical or submandibular lymphadenopathy Genitourinary: Right mastectomy.   LABS: Results for orders placed or performed during the hospital encounter of [id number]  Basic metabolic panel  Result Value Ref Range   Sodium 142 135 - 145 mmol/L   Potassium 4.3 3.5 - 5.1 mmol/L   Chloride 111 101 - 111 mmol/L   CO2 22 22 - 32 mmol/L   Glucose, Bld 93 65 - 99 mg/dL   BUN 11 6 - 20 mg/dL   Creatinine, Ser 1.00 0.44 - 1.00 mg/dL   Calcium 9.4 8.9 - 10.3 mg/dL   GFR calc non Af Wyvonnia Lora  56 (L) >60 mL/min   GFR calc Af Amer >60 >60 mL/min   Anion gap 9 5 - 15  CBC  Result Value Ref Range   WBC 22.3 (H) 4.0 - 10.5 K/uL     RBC 3.85 (L) 3.87 - 5.11 MIL/uL   Hemoglobin 10.7 (L) 12.0 - 15.0 g/dL   HCT 33.0 (L) 36.0 - 46.0 %   MCV 85.7 78.0 - 100.0 fL   MCH 27.8 26.0 - 34.0 pg   MCHC 32.4 30.0 - 36.0 g/dL   RDW 15.3 11.5 - 15.5 %   Platelets 171 150 - 400 K/uL  Brain natriuretic peptide  Result Value Ref Range   B Natriuretic Peptide 66.7 0.0 - 100.0 pg/mL  I-stat troponin, ED  Result Value Ref Range   Troponin i, poc 0.01 0.00 - 0.08 ng/mL   Comment 3            EKG/XRAY:   Primary read interpreted by Dr. Everlene Farrier at [facility]. Status post right mastectomy with elevated right diaphragm no infiltrates seen.  EKG sinus tachycardia without acute changes.  ASSESSMENT/PLAN:  patient has a persistent cough. Go ahead and do a stat d-dimer. She has dyspnea on exertion. Referral made to cardiology and to pulmonary for their help. Patient placed on doxycycline to cover for bronchitis.I personally performed the services described in this documentation, which was scribed in my presence. The recorded information has been reviewed and is accurate.she has been followed regularly by [facility]  cardiology.    Gross sideeffects, risk and benefits, and alternatives of medications d/w patient. Patient is aware that all medications have potential sideeffects and we are unable to predict every sideeffect or drug-drug interaction that may occur.  [name] MD 09/25/2015 11:54 AM

## 2015-09-25 NOTE — Patient Instructions (Signed)
I have placed her on doxycycline for bronchitis. I have made referrals back to [facility] cardiology and also to pulmonary. I will call you later today with results of your other blood work.

## 2015-09-26 ENCOUNTER — Telehealth: Payer: Self-pay

## 2015-09-26 LAB — PATHOLOGIST SMEAR REVIEW

## 2015-09-26 NOTE — Telephone Encounter (Signed)
Pt have been trying to get her lab results and they keep missing each other. Please call [phone]

## 2015-09-26 NOTE — Telephone Encounter (Signed)
Called pt back and told her the results of her D-dimer since Marlowe Kays tried on yesterday.  Pt would like to know the remainder of her lab results.  I told her Dr. Everlene Farrier has not reviewed them yet.  She understood.

## 2015-09-27 NOTE — Telephone Encounter (Signed)
Pt.notified

## 2015-09-27 NOTE — Telephone Encounter (Signed)
Her white count was 28,000. The pathology review showed the changes have chronic lymphocytic leukemia. I forwarded these results to Dr. Jana Hakim.. Please check with him at his office and see when he wants to see you

## 2015-10-03 NOTE — Telephone Encounter (Signed)
Pt was notified of results

## 2015-10-05 ENCOUNTER — Telehealth: Payer: Self-pay

## 2015-10-05 ENCOUNTER — Ambulatory Visit (INDEPENDENT_AMBULATORY_CARE_PROVIDER_SITE_OTHER): Payer: Medicare Other | Admitting: Emergency Medicine

## 2015-10-05 VITALS — BP 128/80 | HR 100 | Temp 98.7°F | Resp 17 | Ht 64.5 in | Wt 204.0 lb

## 2015-10-05 DIAGNOSIS — R05 Cough: Secondary | ICD-10-CM

## 2015-10-05 DIAGNOSIS — R0602 Shortness of breath: Secondary | ICD-10-CM | POA: Diagnosis not present

## 2015-10-05 DIAGNOSIS — R059 Cough, unspecified: Secondary | ICD-10-CM

## 2015-10-05 NOTE — Telephone Encounter (Signed)
Pt called to update worsening symptoms; still coughing a lot, SOB, discomfort on right side of chest. Advised PT to come in during walk-in hours.

## 2015-10-05 NOTE — Progress Notes (Signed)
By signing my name below, I, [name], attest that this documentation has been prepared under the direction and in the presence of [name], MD. Electronically Signed: [name], [place]. 10/05/2015 , 2:35 PM .  Patient was seen in room 2 .  Chief Complaint:  Chief Complaint  Patient presents with  . Shortness of Breath  . URI    HPI: [name] [name] is a 70 y.o. female who reports to [facility] today complaining of shortness of breath with URI symptoms.  Pt has h/o chronic lymphocytic leukemia, she was put her on doxycycline.   Pt states that she's still coughing and feels really fatigue. She went into her garage (2 steps down and then 2 steps back up) this morning and felt very short of breath. Then, she was washing some dishes and she heard a weird sound, which turned out to be her own heavy wheezing.   She also has a dentist appointment in 3 days. She ate some popcorn and a kernel broke off a piece of her tooth.   She's taking omeprazole everyday to prevent reflux.   She has an appointment with pulmonologist Dr. Melvyn Novas on 10/17/15.  She has an appointment with cardiologist Dr. Marlou Porch on 10/15/15.   Past Medical History  Diagnosis Date  . Sinus tachycardia (HCC)     mild resting  . HLD (hyperlipidemia)   . Orthostatic hypotension     slight in the past  . Anxiety   . Depression   . Diverticulosis   . Fibromyalgia 1978  . Hypothyroidism   . Alopecia   . Ruptured disk     one in neck and two in back  . Plantar fasciitis   . GERD (gastroesophageal reflux disease)   . History of syncope     episode 2008--  no recurrence since  . Ureteral calculi     bilateral  . Leukocytosis   . Chronic back pain     "lower back and upper neck" (07/28/2013)  . 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 ear   . Urgency of urination   . Hematuria   . Sensation of pressure in bladder area   . Wears glasses   . Ecchymosis     FROM IV AND LAB WORK THE PAST WEEK  06-17-2013  . Stool incontinence    "at times recently"   . Kidney stones 2014  . CAP (community acquired pneumonia)     admission 06-04-2013 secondary to failed oral antibitotics---  last cxr 06-16-2013  much improved  . CLL (chronic lymphocytic leukemia) ([place]) dx'd 05/2013    "I don't think I have it though" (07/28/2013)  . Breast cancer ([place]) 1997; 2014    right  . Ejection fraction   . SOB (shortness of breath) 06-17-2013 CURRENTLY RESIDUAL FROM RECENT CAP    new since sore throats- 12/2014    . Arthritis     "spine" (07/28/2013)  . DDD (degenerative disc disease)   . Deafness in left ear   . S/P chemotherapy, time since greater than 12 weeks    Past Surgical History  Procedure Laterality Date  . Lumbar epidural injection      has had 7 injections  . Total abdominal hysterectomy w/ bilateral salpingoophorectomy  1997  . Breast lumpectomy with axillary lymph node dissection Right 1997  . Transthoracic echocardiogram  05-11-2007  dr Ron Parker    normal lvf/  ef 65%  . Tonsillectomy  AGE 64  . Retinal detachment surgery Right 2013  .  Cystoscopy with retrograde pyelogram, ureteroscopy and stent placement Bilateral 06/17/2013    Procedure: CYSTOSCOPY WITH RETROGRADE PYELOGRAM, URETEROSCOPY AND LEFT DOUBLE  J STENT PLACEMENT RIGHT URETERAL HOLMIIUM LASER AND DOUBLE J STENT ;  Surgeon: Hanley Ben, MD;  Location: [place];  Service: Urology;  Laterality: Bilateral;  . Holmium laser application Bilateral [id number]    Procedure: HOLMIUM LASER APPLICATION;  Surgeon: Hanley Ben, MD;  Location: [place];  Service: Urology;  Laterality: Bilateral;  . Mastectomy w/ nodes partial Right 1997  . Mastectomy complete / simple Right 07/28/2013  . Appendectomy  1997  . Breast biopsy Right 2014  . Cystoscopy with ureteroscopy, stone basketry and stent placement Bilateral 2014  . Lithotripsy Left ~ 06/2013  . Simple mastectomy with axillary sentinel node biopsy Right 07/28/2013    Procedure: RIGHT  TOTAL  MASTECTOMY;  Surgeon: Imogene Burn. Georgette Dover, MD;  Location: [place];  Service: General;  Laterality: Right;  . Portacath placement Left 07/28/2013    Procedure: ATTEMPTED INSERTION PORT-A-CATH;  Surgeon: Imogene Burn. Georgette Dover, MD;  Location: [place];  Service: General;  Laterality: Left;  . Port-a-cath removal Left 10/03/2014    Procedure: REMOVAL PORT-A-CATH;  Surgeon: Donnie Mesa, MD;  Location: [place];  Service: General;  Laterality: Left;  . Abdominal hysterectomy    . Robotic assited partial nephrectomy Right 02/05/2015    Procedure: ROBOTIC ASSITED PARTIAL NEPHRECTOMY;  Surgeon: Raynelle Bring, MD;  Location: [facility];  Service: Urology;  Laterality: Right;   Social History   Social History  . Marital Status: Married    Spouse Name: N/A  . Number of Children: 1  . Years of Education: N/A   Occupational History  . homemaker    Social History Main Topics  . Smoking status: Never Smoker   . Smokeless tobacco: Never Used  . Alcohol Use: No     Comment: 07/28/2013 "no alcohol since 1994; never had problem w/it"  . Drug Use: No  . Sexual Activity: No     Comment: TAH/BSO   Other Topics Concern  . None   Social History Narrative   Family History  Problem Relation Age of Onset  . Heart disease Maternal Grandfather   . Diabetes Maternal Grandfather   . Colon cancer Maternal Aunt 79  . Brain cancer Paternal Grandmother     dx in 92s  . Dementia Mother   . Diabetes Mother   . Osteoporosis Mother   . Diabetes Maternal Aunt   . Prostate cancer Father 2  . Bipolar disorder Maternal Aunt   . Stroke Maternal Aunt   . Stomach cancer Paternal Uncle     dx in late 44s   Allergies  Allergen Reactions  . Lasix [Furosemide] Nausea And Vomiting and Other (See Comments)    headache  . Lithium Other (See Comments)    Dizziness, "cause me to fall"  . Sulfa Antibiotics Hives  . Trazodone And Nefazodone Other (See Comments)    insomnia  . Lyrica [Pregabalin] Diarrhea,  Nausea Only and Rash   Prior to Admission medications   Medication Sig Start Date End Date Taking? Authorizing Provider  ALPRAZolam (XANAX) 0.5 MG tablet TAKE 1 TABLET EVERY 8 HOURS AS NEEDED Patient taking differently: TAKE 1 TABLET BY MOUTH EVERY TWICE DAILY.    Darlyne Russian, MD  amitriptyline (ELAVIL) 150 MG tablet Take 150 mg by mouth at bedtime.    Historical Provider, MD  amoxicillin (AMOXIL) 500 MG capsule Take 1  capsule (500 mg total) by mouth 3 (three) times daily. Patient not taking: Reported on 09/25/2015 08/02/15   Marella Chimes, PA-C  anastrozole (ARIMIDEX) 1 MG tablet TAKE 1 TABLET (1 MG TOTAL) BY MOUTH DAILY. 09/12/15   Chauncey Cruel, MD  aspirin 81 MG tablet Take 81 mg by mouth daily.    Historical Provider, MD  buPROPion (WELLBUTRIN XL) 150 MG 24 hr tablet Take 150 mg by mouth daily. Reported on 09/25/2015 07/31/15   Historical Provider, MD  Cholecalciferol (VITAMIN D-3 PO) Take 1 capsule by mouth daily. Reported on 09/25/2015    Historical Provider, MD  doxycycline (VIBRA-TABS) 100 MG tablet Take 1 tablet (100 mg total) by mouth 2 (two) times daily. 09/25/15   Darlyne Russian, MD  levothyroxine (SYNTHROID, LEVOTHROID) 50 MCG tablet TAKE 1 TABLET (50 MCG TOTAL) BY MOUTH DAILY BEFORE BREAKFAST. 05/07/15   Megan Salon, MD  metoprolol succinate (TOPROL-XL) 25 MG 24 hr tablet TAKE 1 TABLET (25 MG TOTAL) BY MOUTH DAILY. 02/07/15   Carlena Bjornstad, MD  minoxidil (ROGAINE) 2 % external solution Apply 1 application topically 2 (two) times daily as needed (hair.).     Historical Provider, MD  Multiple Vitamins-Minerals (HAIR/SKIN/NAILS PO) Take 1 tablet by mouth daily.    Historical Provider, MD  Omega-3 Fatty Acids (FISH OIL PO) Take 1 capsule by mouth daily.     Historical Provider, MD  omeprazole (PRILOSEC) 40 MG capsule Take 1 capsule (40 mg total) by mouth every morning. 09/12/15   Chauncey Cruel, MD  REXULTI 0.5 MG TABS Take 0.5 mg by mouth at bedtime.  11/05/14   Historical  Provider, MD  VIIBRYD 40 MG TABS Take 40 mg by mouth daily.  11/13/14   Historical Provider, MD     ROS:  Constitutional: negative for fever, chills, night sweats, weight changes, or fatigue  HEENT: negative for vision changes, hearing loss, congestion, rhinorrhea, ST, epistaxis, or sinus pressure; positive for dental problem Cardiovascular: negative for chest pain or palpitations Respiratory: negative for hemoptysis, or cough; positive for shortness of breath, wheezing Abdominal: negative for abdominal pain, nausea, vomiting, diarrhea, or constipation Dermatological: negative for rash Neurologic: negative for headache, dizziness, or syncope All other systems reviewed and are otherwise negative with the exception to those above and in the HPI.  PHYSICAL EXAM: Filed Vitals:   10/05/15 1345  BP: 128/80  Pulse: 101  Temp: 98.7 F (37.1 C)  Resp: 17   Body mass index is 34.49 kg/(m^2).   General: Alert, no acute distress HEENT:  Normocephalic, atraumatic, oropharynx patent.; hearing aid in left ear Eye: [facility], [facility] Cardiovascular:  Regular rate and rhythm, no rubs murmurs or gallops.  No Carotid bruits, radial pulse intact. No pedal edema.  Respiratory: Clear to auscultation bilaterally.  No wheezes, rales, or rhonchi.  No cyanosis, no use of accessory musculature Abdominal: No organomegaly, abdomen is soft and non-tender, positive bowel sounds. No masses. Musculoskeletal: Gait intact. No edema, tenderness Skin: No rashes. Neurologic: Facial musculature symmetric. Psychiatric: Patient acts appropriately throughout our interaction.  Lymphatic: No cervical or submandibular lymphadenopathy Genitourinary/Anorectal: No acute findings   LABS:   EKG/XRAY:   Primary read interpreted by Dr. Everlene Farrier at [facility]. Normal sinus rhythm no acute changes.   ASSESSMENT/PLAN: Patient has complaints of cough and shortness of breath. I ambulated her 2 times around the office and pulse ox stayed  96-97. Her EKG is unchanged from her EKG a few weeks ago. She has an appointment to  see the cardiologist on Monday for evaluation. She also has an appointment with pulmonary. She is on reflux medication already.I personally performed the services described in this documentation, which was scribed in my presence. The recorded information has been reviewed and is accurate.   Gross sideeffects, risk and benefits, and alternatives of medications d/w patient. Patient is aware that all medications have potential sideeffects and we are unable to predict every sideeffect or drug-drug interaction that may occur.  [name] MD 10/05/2015 2:35 PM

## 2015-10-05 NOTE — Telephone Encounter (Signed)
Patient came in for recheck. EKG unchanged cardiology appointment was moved up to Monday. She also has a pulmonary appointment.

## 2015-10-05 NOTE — Patient Instructions (Signed)
YOUR APPOINTMENT IS ON Monday 10/08/15 AT 930AM WITH BRIDGET ALLISON, NP.  YOU ARE TO ARRIVE AT 9AM FOR PAPERWORK.  THE ADDRESS IS [place]  PHONE NUMBER [phone].

## 2015-10-08 DIAGNOSIS — R0602 Shortness of breath: Secondary | ICD-10-CM | POA: Diagnosis not present

## 2015-10-08 DIAGNOSIS — R0789 Other chest pain: Secondary | ICD-10-CM | POA: Diagnosis not present

## 2015-10-08 DIAGNOSIS — Z923 Personal history of irradiation: Secondary | ICD-10-CM | POA: Diagnosis not present

## 2015-10-09 ENCOUNTER — Ambulatory Visit (INDEPENDENT_AMBULATORY_CARE_PROVIDER_SITE_OTHER): Payer: Medicare Other | Admitting: Obstetrics & Gynecology

## 2015-10-09 ENCOUNTER — Other Ambulatory Visit: Payer: Self-pay | Admitting: *Deleted

## 2015-10-09 ENCOUNTER — Encounter: Payer: Self-pay | Admitting: Obstetrics & Gynecology

## 2015-10-09 VITALS — BP 126/60 | HR 104 | Resp 16 | Ht 64.0 in | Wt 204.0 lb

## 2015-10-09 DIAGNOSIS — Z0000 Encounter for general adult medical examination without abnormal findings: Secondary | ICD-10-CM

## 2015-10-09 DIAGNOSIS — Z124 Encounter for screening for malignant neoplasm of cervix: Secondary | ICD-10-CM

## 2015-10-09 DIAGNOSIS — Z01419 Encounter for gynecological examination (general) (routine) without abnormal findings: Secondary | ICD-10-CM | POA: Diagnosis not present

## 2015-10-09 DIAGNOSIS — E038 Other specified hypothyroidism: Secondary | ICD-10-CM | POA: Diagnosis not present

## 2015-10-09 MED ORDER — BENZONATATE 100 MG PO CAPS: 100.0000 mg | ORAL_CAPSULE | Freq: Three times a day (TID) | ORAL | Status: DC | PRN

## 2015-10-09 MED ORDER — LEVOTHYROXINE SODIUM 50 MCG PO TABS: 50.0000 ug | ORAL_TABLET | Freq: Every day | ORAL | Status: DC

## 2015-10-09 NOTE — Telephone Encounter (Signed)
Tessalon sent to pharmacy, pt notified. She has stress test 2/17 and pulm appt 2/22.

## 2015-10-09 NOTE — Telephone Encounter (Signed)
Requesting tessolin pearl drops for cough   [facility] - [place]   [phone]

## 2015-10-09 NOTE — Progress Notes (Signed)
70 y.o. G1P1 MarriedCaucasianF here for annual exam.  Reports biggest issues has been a URI.  She's seen Dr. Everlene Farrier, Dr. Ouida Sills, and Dr. Carlota Raspberry for this.  Pt reports she just can't seem to get over this.  Saw Dr. Everitt Amber yesterday, cardiologist.  EKG was negative but she is now going to have a stress test.  This is scheduled for later this week.  Also seeing a pulmonologist, Dr. Melvyn Novas, next week.    Oncologist appt is scheduled for early March.    Had partial nephrectomy with Dr. Alinda Money in 6/16.  Pathology showed CLL.  Recent WBC ct was 09/25/15 was 28K.  Pt also reports having some issues with changes in her eyes.  She's having issues with reading small print and after reading for awhile will feel like her eyes hurt.   Pt doesn't have appt until August.  Denies vaginal bleeding.  Patient's last menstrual period was 08/26/1995.          Sexually active: No.  The current method of family planning is status post hysterectomy.    Exercising: No.  The patient does not participate in regular exercise at present. Smoker:  no  Health Maintenance: Pap:  01/2012 neg History of abnormal Pap:  no MMG: 01/04/15 Left BIRADS1:neg. Hx of Breast cancer  Colonoscopy:  11/03/14 f/u 10 years  BMD:   11/2013  TDaP:  07/23/2006 Screening Labs: PCP, Hb today: PCP, Urine today: PCP   reports that she has never smoked. She has never used smokeless tobacco. She reports that she does not drink alcohol or use illicit drugs.  Past Medical History  Diagnosis Date  . Sinus tachycardia (HCC)     mild resting  . HLD (hyperlipidemia)   . Orthostatic hypotension     slight in the past  . Anxiety   . Depression   . Diverticulosis   . Fibromyalgia 1978  . Hypothyroidism   . Alopecia   . Ruptured disk     one in neck and two in back  . Plantar fasciitis   . GERD (gastroesophageal reflux disease)   . History of syncope     episode 2008--  no recurrence since  . Ureteral calculi     bilateral  . Leukocytosis   .  Chronic back pain     "lower back and upper neck" (07/28/2013)  . 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 ear   . Urgency of urination   . Hematuria   . Sensation of pressure in bladder area   . Wears glasses   . Ecchymosis     FROM IV AND LAB WORK THE PAST WEEK  06-17-2013  . Stool incontinence     "at times recently"   . Kidney stones 2014  . CAP (community acquired pneumonia)     admission 06-04-2013 secondary to failed oral antibitotics---  last cxr 06-16-2013  much improved  . CLL (chronic lymphocytic leukemia) ([place]) dx'd 05/2013    "I don't think I have it though" (07/28/2013)  . Breast cancer ([place]) 1997; 2014    right  . Ejection fraction   . SOB (shortness of breath) 06-17-2013 CURRENTLY RESIDUAL FROM RECENT CAP    new since sore throats- 12/2014    . Arthritis     "spine" (07/28/2013)  . DDD (degenerative disc disease)   . Deafness in left ear   . S/P chemotherapy, time since greater than 12 weeks     Past Surgical History  Procedure Laterality Date  . Lumbar epidural injection  has had 7 injections  . Total abdominal hysterectomy w/ bilateral salpingoophorectomy  1997  . Breast lumpectomy with axillary lymph node dissection Right 1997  . Transthoracic echocardiogram  05-11-2007  dr Ron Parker    normal lvf/  ef 65%  . Tonsillectomy  AGE 59  . Retinal detachment surgery Right 2013  . Cystoscopy with retrograde pyelogram, ureteroscopy and stent placement Bilateral 06/17/2013    Procedure: CYSTOSCOPY WITH RETROGRADE PYELOGRAM, URETEROSCOPY AND LEFT DOUBLE  J STENT PLACEMENT RIGHT URETERAL HOLMIIUM LASER AND DOUBLE J STENT ;  Surgeon: Hanley Ben, MD;  Location: [place];  Service: Urology;  Laterality: Bilateral;  . Holmium laser application Bilateral [id number]    Procedure: HOLMIUM LASER APPLICATION;  Surgeon: Hanley Ben, MD;  Location: [place];  Service: Urology;  Laterality: Bilateral;  . Mastectomy w/ nodes partial Right 1997  . Mastectomy  complete / simple Right 07/28/2013  . Appendectomy  1997  . Breast biopsy Right 2014  . Cystoscopy with ureteroscopy, stone basketry and stent placement Bilateral 2014  . Lithotripsy Left ~ 06/2013  . Simple mastectomy with axillary sentinel node biopsy Right 07/28/2013    Procedure: RIGHT TOTAL  MASTECTOMY;  Surgeon: Imogene Burn. Georgette Dover, MD;  Location: [place];  Service: General;  Laterality: Right;  . Portacath placement Left 07/28/2013    Procedure: ATTEMPTED INSERTION PORT-A-CATH;  Surgeon: Imogene Burn. Georgette Dover, MD;  Location: [place];  Service: General;  Laterality: Left;  . Port-a-cath removal Left 10/03/2014    Procedure: REMOVAL PORT-A-CATH;  Surgeon: Donnie Mesa, MD;  Location: [place];  Service: General;  Laterality: Left;  . Abdominal hysterectomy    . Robotic assited partial nephrectomy Right 02/05/2015    Procedure: ROBOTIC ASSITED PARTIAL NEPHRECTOMY;  Surgeon: Raynelle Bring, MD;  Location: [facility];  Service: Urology;  Laterality: Right;    Current Outpatient Prescriptions  Medication Sig Dispense Refill  . ALPRAZolam (XANAX) 0.5 MG tablet TAKE 1 TABLET EVERY 8 HOURS AS NEEDED (Patient taking differently: TAKE 1 TABLET BY MOUTH EVERY TWICE DAILY.) 30 tablet 0  . amitriptyline (ELAVIL) 150 MG tablet Take 150 mg by mouth at bedtime.    Marland Kitchen anastrozole (ARIMIDEX) 1 MG tablet TAKE 1 TABLET (1 MG TOTAL) BY MOUTH DAILY. 90 tablet 1  . aspirin 81 MG tablet Take 81 mg by mouth daily.    . Cholecalciferol (VITAMIN D-3 PO) Take 1 capsule by mouth daily. Reported on 09/25/2015    . gabapentin (NEURONTIN) 300 MG capsule TK 1 C PO QHS  4  . levothyroxine (SYNTHROID, LEVOTHROID) 50 MCG tablet TAKE 1 TABLET (50 MCG TOTAL) BY MOUTH DAILY BEFORE BREAKFAST. 90 tablet 0  . metoprolol succinate (TOPROL-XL) 25 MG 24 hr tablet TAKE 1 TABLET (25 MG TOTAL) BY MOUTH DAILY. 30 tablet 11  . minoxidil (ROGAINE) 2 % external solution Apply 1 application topically 2 (two) times daily as needed (hair.).      Marland Kitchen Multiple Vitamins-Minerals (HAIR/SKIN/NAILS PO) Take 1 tablet by mouth daily.    . Omega-3 Fatty Acids (FISH OIL PO) Take 1 capsule by mouth daily.     Marland Kitchen omeprazole (PRILOSEC) 40 MG capsule Take 1 capsule (40 mg total) by mouth every morning. 90 capsule 1  . REXULTI 0.5 MG TABS Take 0.5 mg by mouth at bedtime.   0  . VIIBRYD 40 MG TABS Take 40 mg by mouth daily.   1  . REXULTI 2 MG TABS Reported on 10/09/2015  4   No current  facility-administered medications for this visit.    Family History  Problem Relation Age of Onset  . Heart disease Maternal Grandfather   . Diabetes Maternal Grandfather   . Colon cancer Maternal Aunt 79  . Brain cancer Paternal Grandmother     dx in 89s  . Dementia Mother   . Diabetes Mother   . Osteoporosis Mother   . Diabetes Maternal Aunt   . Prostate cancer Father 14  . Bipolar disorder Maternal Aunt   . Stroke Maternal Aunt   . Stomach cancer Paternal Uncle     dx in late 18s    ROS:  Pertinent items are noted in HPI.  Otherwise, a comprehensive ROS was negative.  Exam:   BP 126/60 mmHg  Pulse 104  Resp 16  Ht 5\' 4"  (1.626 m)  Wt 204 lb (92.534 kg)  BMI 35.00 kg/m2  LMP 08/26/1995  Weight change: +17#  Height: 5\' 4"  (162.6 cm)  Ht Readings from Last 3 Encounters:  10/09/15 5\' 4"  (1.626 m)  10/05/15 5' 4.5" (1.638 m)  09/25/15 5\' 4"  (1.626 m)    General appearance: alert, cooperative and appears stated age Head: Normocephalic, without obvious abnormality, atraumatic Neck: no adenopathy, supple, symmetrical, trachea midline and thyroid normal to inspection and palpation Lungs: clear to auscultation bilaterally Breasts: s/p right mastectomy, no LAD noted, normal left breast without masses, skin changes, or nipple retraction Heart: regular rate and rhythm Abdomen: soft, non-tender; bowel sounds normal; no masses,  no organomegaly Extremities: extremities normal, atraumatic, no cyanosis or edema Skin: Skin color, texture, turgor normal. No  rashes or lesions Lymph nodes: Cervical, supraclavicular, and axillary nodes normal. No abnormal inguinal nodes palpated Neurologic: Grossly normal   Pelvic: External genitalia:  no lesions              Urethra:  normal appearing urethra with no masses, tenderness or lesions              Bartholins and Skenes: normal                 Vagina: normal appearing vagina with normal color and discharge, no lesions              Cervix: absent              Pap taken: No. Bimanual Exam:  Uterus:  uterus absent              Adnexa: no mass, fullness, tenderness               Rectovaginal: Confirms               Anus:  normal sphincter tone, no lesions  Chaperone was present for exam.  A:  Well Woman with normal exam S/P TAH/BSO H/O breast cancer 1997 with lumpectomy, radiation and 5 years of Tamoxifen and then recurrent breast cancer 2013 s/p simple mastectomy, chemo, now on Armidex H/O CLL, being followed conservatively H/O nephrolithiasis Alopecia Diarrhea since surgery. Evaluation was negative.  This has improved.  Mostly, now has episodes of loose stools only in the morning. H/o fatty liver, diagnosed 2010. Hearing loss with hearing aid now Hypothyroidism  P: Mammogram yearly pap smear not done. H/O TAH/BSO. Last pap 2013. Labs with oncologist and PCP TSH today.  RF for synthroid 65mcg to pharmacy.  #90/4RF. return annually or prn

## 2015-10-10 LAB — IPS PAP SMEAR ONLY

## 2015-10-10 LAB — TSH: TSH: 2.58 mIU/L

## 2015-10-12 DIAGNOSIS — R0602 Shortness of breath: Secondary | ICD-10-CM | POA: Diagnosis not present

## 2015-10-12 DIAGNOSIS — I10 Essential (primary) hypertension: Secondary | ICD-10-CM | POA: Diagnosis not present

## 2015-10-12 DIAGNOSIS — R000 Tachycardia, unspecified: Secondary | ICD-10-CM | POA: Diagnosis not present

## 2015-10-15 ENCOUNTER — Ambulatory Visit: Payer: Medicare Other | Admitting: Cardiology

## 2015-10-17 ENCOUNTER — Other Ambulatory Visit (INDEPENDENT_AMBULATORY_CARE_PROVIDER_SITE_OTHER): Payer: Medicare Other

## 2015-10-17 ENCOUNTER — Institutional Professional Consult (permissible substitution): Payer: Medicare Other | Admitting: Internal Medicine

## 2015-10-17 ENCOUNTER — Encounter: Payer: Self-pay | Admitting: Internal Medicine

## 2015-10-17 ENCOUNTER — Ambulatory Visit (INDEPENDENT_AMBULATORY_CARE_PROVIDER_SITE_OTHER): Payer: Medicare Other | Admitting: Internal Medicine

## 2015-10-17 VITALS — BP 154/90 | HR 103 | Ht 64.0 in | Wt 200.6 lb

## 2015-10-17 DIAGNOSIS — R058 Other specified cough: Secondary | ICD-10-CM

## 2015-10-17 DIAGNOSIS — R0602 Shortness of breath: Secondary | ICD-10-CM | POA: Diagnosis not present

## 2015-10-17 DIAGNOSIS — R05 Cough: Secondary | ICD-10-CM

## 2015-10-17 DIAGNOSIS — I10 Essential (primary) hypertension: Secondary | ICD-10-CM

## 2015-10-17 LAB — CBC WITH DIFFERENTIAL/PLATELET
BASOS PCT: 0.8 % (ref 0.0–3.0)
Basophils Absolute: 0.2 10*3/uL — ABNORMAL HIGH (ref 0.0–0.1)
Eosinophils Absolute: 0.5 10*3/uL (ref 0.0–0.7)
Eosinophils Relative: 1.8 % (ref 0.0–5.0)
HEMATOCRIT: 33.2 % — AB (ref 36.0–46.0)
Hemoglobin: 10.9 g/dL — ABNORMAL LOW (ref 12.0–15.0)
Lymphs Abs: 21.4 10*3/uL — ABNORMAL HIGH (ref 0.7–4.0)
MCHC: 32.9 g/dL (ref 30.0–36.0)
MCV: 81.3 fl (ref 78.0–100.0)
MONOS PCT: 8.8 % (ref 3.0–12.0)
Monocytes Absolute: 2.6 10*3/uL — ABNORMAL HIGH (ref 0.1–1.0)
NEUTROS ABS: 4.5 10*3/uL (ref 1.4–7.7)
Neutrophils Relative %: 15.5 % — ABNORMAL LOW (ref 43.0–77.0)
PLATELETS: 306 10*3/uL (ref 150.0–400.0)
RBC: 4.08 Mil/uL (ref 3.87–5.11)
RDW: 15.5 % (ref 11.5–15.5)
WBC: 29.3 10*3/uL (ref 4.0–10.5)

## 2015-10-17 MED ORDER — FLUTTER DEVI: Status: DC

## 2015-10-17 MED ORDER — PREDNISONE 10 MG PO TABS: ORAL_TABLET | ORAL | Status: DC

## 2015-10-17 NOTE — Patient Instructions (Addendum)
Omeprazole 40 mg Take 30- 60 min before your first and last meals of the day   Prednisone 10 mg take  4 each am x 2 days,   2 each am x 2 days,  1 each am x 2 days and stop   For cough use delsym 2 tsp every 12 hours and cough into the flutter valve to prevent airway irritation   GERD (REFLUX)  is an extremely common cause of respiratory symptoms just like yours , many times with no obvious heartburn at all.    It can be treated with medication, but also with lifestyle changes including elevation of the head of your bed (ideally with 6 inch  bed blocks),  Smoking cessation, avoidance of late meals, excessive alcohol, and avoid fatty foods, chocolate, peppermint, colas, red wine, and acidic juices such as orange juice.  NO MINT OR MENTHOL PRODUCTS SO NO COUGH DROPS  USE SUGARLESS CANDY INSTEAD (Jolley ranchers or Stover's or Life Savers) or even ice chips will also do - the key is to swallow to prevent all throat clearing. NO OIL BASED VITAMINS - use powdered substitutes- NO FISH OIL  Please remember to go to the lab   department downstairs for your tests - we will call you with the results when they are available.  If not better I will recommend ENT evaluation perhaps at [facility] Dr Joya Gaskins

## 2015-10-17 NOTE — Progress Notes (Signed)
Subjective:    Patient ID: [name] [name], female    DOB: 05/20/1946,    MRN: [id number]  HPI  44 yowf never smoked but lifelong tendency to runny nose fall and spring  took allergy shots for sev years in her 51s seemed to help but still needed otc's like afrin and whenever caught cold freq needed to see doctor rx abx and occasionally needed prednisone and inhaler was prescribed but she rarely needed then had a flare rhinitis early Nov  2016 with persistent prominent  cough and doe x across room referred to pulmonary clinic 10/17/2015 by Dr Everlene Farrier for ? Asthma but had perfectly nl pfts while symptomatic 10/17/2015 including [id number]     10/17/2015 1st [facility] office visit/ [name] [name]   Chief Complaint  Patient presents with  . PULMONARY CONSULT    Referred by Dr. [name]. Pt c/o increasing SOB with exertion that improves with rest, cough and wheeze that is worse at night, hoarseness and some chest tightness. Pt reports symptoms present for about 2 months. Pt was on allergy immunotherapy many years ago but denies any history of asthma or other lung conditions.    symptoms are worse during day esp if exerting/talking  better when lie down with cough > yellowish mucus and constant pnds but doesn't keep her up   rx amox/ doxy/ zpak no better   No obvious other patterns in day to day or daytime variabilty or assoc  cp or chest tightness, subjective wheeze overt   hb symptoms. No unusual exp hx or h/o childhood pna/ asthma or knowledge of premature birth.  Sleeping ok without nocturnal  or early am exacerbation  of respiratory  c/o's or need for noct saba. Also denies any obvious fluctuation of symptoms with weather or environmental changes or other aggravating or alleviating factors except as outlined above   Current Medications, Allergies, Complete Past Medical History, Past Surgical History, Family History, and Social History were reviewed in [employer] record.                 Review of Systems  Constitutional: Negative.  Negative for fever and unexpected weight change.  HENT: Positive for congestion, postnasal drip, rhinorrhea and sinus pressure. Negative for dental problem, ear pain, nosebleeds, sneezing, sore throat and trouble swallowing.   Eyes: Negative.  Negative for redness and itching.  Respiratory: Positive for cough, chest tightness, shortness of breath and wheezing.   Cardiovascular: Positive for palpitations. Negative for leg swelling.  Gastrointestinal: Negative.  Negative for nausea and vomiting.  Endocrine: Negative.   Genitourinary: Negative.  Negative for dysuria.  Musculoskeletal: Positive for joint swelling and arthralgias.  Skin: Negative.  Negative for rash.  Allergic/Immunologic: Positive for environmental allergies.  Neurological: Negative.  Negative for headaches.  Hematological: Negative.  Does not bruise/bleed easily.  Psychiatric/Behavioral: Negative.  Negative for dysphoric mood. The patient is not nervous/anxious.        Objective:   Physical Exam  amb wf nad very pleasant but extremely evasive with specifics of symptom hx   Wt Readings from Last 3 Encounters:  10/17/15 200 lb 9.6 oz (90.992 kg)  10/09/15 204 lb (92.534 kg)  10/05/15 204 lb (92.534 kg)    Vital signs reviewed  HEENT: nl dentition, turbinates, and oropharynx. Nl external ear canals without cough reflex   NECK :  without JVD/Nodes/TM/ nl carotid upstrokes bilaterally   LUNGS: no acc muscle use,  Nl contour chest which is clear to A  and P bilaterally without cough on insp or exp maneuvers   CV:  RRR  no s3 or murmur or increase in P2, no edema   ABD:  soft and nontender with nl inspiratory excursion in the supine position. No bruits or organomegaly, bowel sounds nl  MS:  Nl gait/ ext warm without deformities, calf tenderness, cyanosis or clubbing No obvious joint restrictions   SKIN: warm and dry without lesions    NEURO:  alert,  approp, nl sensorium with  no motor deficits     I personally reviewed images and agree with radiology impression as follows:  CXR: 09/25/15 No active cardiopulmonary disease   Labs sent 10/17/2015 allergy profile/ [facility]     Assessment & Plan:

## 2015-10-18 ENCOUNTER — Encounter: Payer: Self-pay | Admitting: Internal Medicine

## 2015-10-18 DIAGNOSIS — I10 Essential (primary) hypertension: Secondary | ICD-10-CM | POA: Insufficient documentation

## 2015-10-18 LAB — RESPIRATORY ALLERGY PROFILE REGION II ~~LOC~~
Allergen, Comm Silver Birch, t9: <0.1 kU/L
Allergen, Cottonwood, t14: <0.1 kU/L
Allergen, Mulberry, t76: <0.1 kU/L
Aspergillus fumigatus, m3: <0.1 kU/L
Bermuda Grass: <0.1 kU/L
Box Elder IgE: <0.1 kU/L
Cladosporium Herbarum: <0.1 kU/L
Cockroach: <0.1 kU/L
Common Ragweed: <0.1 kU/L
IgE (Immunoglobulin E), Serum: 4 kU/L (ref <115)
Johnson Grass: <0.1 kU/L
Penicillium Notatum: <0.1 kU/L

## 2015-10-18 NOTE — Assessment & Plan Note (Addendum)
Spirometry 10/17/2015  wnl including fef 25-75  Flutter valve added 10/17/2015    The most common causes of chronic cough in immunocompetent adults include the following: upper airway cough syndrome (UACS), previously referred to as postnasal drip syndrome (PNDS), which is caused by variety of rhinosinus conditions; (2) asthma; (3) GERD; (4) chronic bronchitis from cigarette smoking or other inhaled environmental irritants; (5) nonasthmatic eosinophilic bronchitis; and (6) bronchiectasis.   These conditions, singly or in combination, have accounted for up to 94% of the causes of chronic cough in prospective studies.   Other conditions have constituted no >6% of the causes in prospective studies These have included bronchogenic carcinoma, chronic interstitial pneumonia, sarcoidosis, left ventricular failure, ACEI-induced cough, and aspiration from a condition associated with pharyngeal dysfunction.    Chronic cough is often simultaneously caused by more than one condition. A single cause has been found from 38 to 82% of the time, multiple causes from 18 to 62%. Multiply caused cough has been the result of three diseases up to 42% of the time.       Based on hx and exam, this is most likely:  Classic Upper airway cough syndrome, so named because it's frequently impossible to sort out how much is  CR/sinusitis with freq throat clearing (which can be related to primary GERD)   vs  causing  secondary (" extra esophageal")  GERD from wide swings in gastric pressure that occur with throat clearing, often  promoting self use of mint and menthol lozenges that reduce the lower esophageal sphincter tone and exacerbate the problem further in a cyclical fashion.   These are the same pts (now being labeled as having "irritable larynx syndrome" by some cough centers) who not infrequently have a history of having failed to tolerate ace inhibitors,  dry powder inhalers or biphosphonates or report having atypical reflux  symptoms that don't respond to standard doses of PPI , and are easily confused as having aecopd or asthma flares by even experienced allergists/ pulmonologists.   The first step is to maximize acid suppression/ GERD diet  and eliminate cyclical coughing with flutter then consider refer to [facility]   I had an extended discussion with the patient reviewing all relevant studies completed to date and  lasting 35/60 min  1) discussion of UACS/vcd/irritable larynx syndrome and how rx differs from treatment for asthma   2) Each maintenance medication was reviewed in detail including most importantly the difference between maintenance and as needed and under what circumstances the prns are to be used.  Please see instructions for details which were reviewed in writing and the patient given a copy.

## 2015-10-18 NOTE — Assessment & Plan Note (Signed)
Not ideal  control on present rx, reviewed> Follow up per Primary Care planned

## 2015-10-18 NOTE — Assessment & Plan Note (Signed)
Complicated by hbp  Body mass index is 34.42    Lab Results  Component Value Date   TSH 2.58 10/09/2015     Contributing to gerd tendency/ doe/reviewed the need and the process to achieve and maintain neg calorie balance > defer f/u primary care including intermittently monitoring thyroid status

## 2015-10-18 NOTE — Progress Notes (Signed)
Quick Note:  Spoke with pt and notified of results per Dr. Wert. Pt verbalized understanding and denied any questions.  ______ 

## 2015-10-18 NOTE — Assessment & Plan Note (Addendum)
SOB from obesity and deconditioning 2009- eval included CPX - 10/17/2015  Walked RA x 2 laps @ 185 ft each stopped due to sob and fatigue    Symptoms are markedly disproportionate to objective findings and not clear this is a lung problem but pt does appear to have difficult airway management issues. DDX of  difficult airways management almost all start with A and  include Adherence, Ace Inhibitors, Acid Reflux, Active Sinus Disease, Alpha 1 Antitripsin deficiency, Anxiety masquerading as Airways dz,  ABPA,  Allergy(esp in young), Aspiration (esp in elderly), Adverse effects of meds,  Active smokers, A bunch of PE's (a small clot burden can't cause this syndrome unless there is already severe underlying pulm or vascular dz with poor reserve) plus two Bs  = Bronchiectasis and Beta blocker use..and one C= CHF   Adherence is always the initial "prime suspect" and is a multilayered concern that requires a "trust but verify" approach in every patient - starting with knowing how to use medications, especially inhalers, correctly, keeping up with refills and understanding the fundamental difference between maintenance and prns vs those medications only taken for a very short course and then stopped and not refilled.   ? Acid (or non-acid) GERD > always difficult to exclude as up to 75% of pts in some series report no assoc GI/ Heartburn symptoms> rec max (24h)  acid suppression and diet restrictions/  See uacs  ? Anxiety related to deconditioning, her prev w/u dx > usually at the bottom of this list of usual suspects but should be much higher on this pt's based on H and P and note already on psychotropics .   ? Allergy/ asthma > allergy profile sent   No further w/u needed until uacs addressed.

## 2015-10-20 ENCOUNTER — Telehealth: Payer: Self-pay | Admitting: Oncology

## 2015-10-20 NOTE — Telephone Encounter (Signed)
Due to HB out moved 3/3 lab/fu to 3/17 at 1:45 pm. Spoke with patient she is aware.

## 2015-10-23 DIAGNOSIS — I10 Essential (primary) hypertension: Secondary | ICD-10-CM | POA: Diagnosis not present

## 2015-10-23 DIAGNOSIS — R0602 Shortness of breath: Secondary | ICD-10-CM | POA: Diagnosis not present

## 2015-10-23 DIAGNOSIS — R000 Tachycardia, unspecified: Secondary | ICD-10-CM | POA: Diagnosis not present

## 2015-10-25 ENCOUNTER — Other Ambulatory Visit: Payer: Medicare Other

## 2015-10-25 ENCOUNTER — Ambulatory Visit: Payer: Medicare Other | Admitting: Oncology

## 2015-10-26 ENCOUNTER — Other Ambulatory Visit: Payer: Medicare Other

## 2015-10-26 ENCOUNTER — Ambulatory Visit: Payer: Medicare Other | Admitting: Nurse Practitioner

## 2015-11-01 DIAGNOSIS — R0602 Shortness of breath: Secondary | ICD-10-CM | POA: Diagnosis not present

## 2015-11-01 DIAGNOSIS — Z923 Personal history of irradiation: Secondary | ICD-10-CM | POA: Diagnosis not present

## 2015-11-01 DIAGNOSIS — R0789 Other chest pain: Secondary | ICD-10-CM | POA: Diagnosis not present

## 2015-11-08 ENCOUNTER — Telehealth: Payer: Self-pay

## 2015-11-08 ENCOUNTER — Other Ambulatory Visit: Payer: Self-pay | Admitting: *Deleted

## 2015-11-08 DIAGNOSIS — C50511 Malignant neoplasm of lower-outer quadrant of right female breast: Secondary | ICD-10-CM

## 2015-11-08 DIAGNOSIS — R059 Cough, unspecified: Secondary | ICD-10-CM

## 2015-11-08 DIAGNOSIS — R0602 Shortness of breath: Secondary | ICD-10-CM

## 2015-11-08 DIAGNOSIS — R05 Cough: Secondary | ICD-10-CM

## 2015-11-08 NOTE — Telephone Encounter (Signed)
Please call the patient. I would like her to get further instructions from the pulmonary specialist Dr. Melvyn Novas.

## 2015-11-08 NOTE — Telephone Encounter (Signed)
Pt is still having coughing issues and would like to know what dr Everlene Farrier would want her to do about   Best number [phone]

## 2015-11-09 ENCOUNTER — Other Ambulatory Visit (HOSPITAL_BASED_OUTPATIENT_CLINIC_OR_DEPARTMENT_OTHER): Payer: Medicare Other

## 2015-11-09 ENCOUNTER — Ambulatory Visit (HOSPITAL_BASED_OUTPATIENT_CLINIC_OR_DEPARTMENT_OTHER): Payer: Medicare Other | Admitting: Nurse Practitioner

## 2015-11-09 ENCOUNTER — Telehealth: Payer: Self-pay | Admitting: Nurse Practitioner

## 2015-11-09 ENCOUNTER — Encounter: Payer: Self-pay | Admitting: Nurse Practitioner

## 2015-11-09 VITALS — BP 124/68 | HR 90 | Temp 98.1°F | Resp 18 | Ht 64.0 in | Wt 201.1 lb

## 2015-11-09 DIAGNOSIS — C9110 Chronic lymphocytic leukemia of B-cell type not having achieved remission: Secondary | ICD-10-CM

## 2015-11-09 DIAGNOSIS — D72829 Elevated white blood cell count, unspecified: Secondary | ICD-10-CM

## 2015-11-09 DIAGNOSIS — R059 Cough, unspecified: Secondary | ICD-10-CM

## 2015-11-09 DIAGNOSIS — Z170 Estrogen receptor positive status [ER+]: Secondary | ICD-10-CM

## 2015-11-09 DIAGNOSIS — C50511 Malignant neoplasm of lower-outer quadrant of right female breast: Secondary | ICD-10-CM

## 2015-11-09 DIAGNOSIS — R05 Cough: Secondary | ICD-10-CM

## 2015-11-09 DIAGNOSIS — E2839 Other primary ovarian failure: Secondary | ICD-10-CM

## 2015-11-09 LAB — CBC WITH DIFFERENTIAL/PLATELET
BASO%: 0.7 % (ref 0.0–2.0)
Basophils Absolute: 0.3 10*3/uL — ABNORMAL HIGH (ref 0.0–0.1)
EOS%: 0.9 % (ref 0.0–7.0)
Eosinophils Absolute: 0.3 10*3/uL (ref 0.0–0.5)
HEMATOCRIT: 39.7 % (ref 34.8–46.6)
HEMOGLOBIN: 12.2 g/dL (ref 11.6–15.9)
LYMPH#: 28.6 10*3/uL — AB (ref 0.9–3.3)
LYMPH%: 74.8 % — ABNORMAL HIGH (ref 14.0–49.7)
MCH: 25.4 pg (ref 25.1–34.0)
MCHC: 30.6 g/dL — ABNORMAL LOW (ref 31.5–36.0)
MCV: 83 fL (ref 79.5–101.0)
MONO#: 1 10*3/uL — ABNORMAL HIGH (ref 0.1–0.9)
MONO%: 2.5 % (ref 0.0–14.0)
NEUT%: 21.1 % — ABNORMAL LOW (ref 38.4–76.8)
NEUTROS ABS: 8.1 10*3/uL — AB (ref 1.5–6.5)
Platelets: 199 10*3/uL (ref 145–400)
RBC: 4.79 10*6/uL (ref 3.70–5.45)
RDW: 15.3 % — AB (ref 11.2–14.5)
WBC: 38.3 10*3/uL — AB (ref 3.9–10.3)

## 2015-11-09 LAB — COMPREHENSIVE METABOLIC PANEL
ALBUMIN: 4.3 g/dL (ref 3.5–5.0)
ALK PHOS: 97 U/L (ref 40–150)
ALT: 68 U/L — AB (ref 0–55)
AST: 40 U/L — AB (ref 5–34)
Anion Gap: 11 mEq/L (ref 3–11)
BUN: 20.6 mg/dL (ref 7.0–26.0)
CO2: 25 meq/L (ref 22–29)
Calcium: 10 mg/dL (ref 8.4–10.4)
Chloride: 103 mEq/L (ref 98–109)
Creatinine: 1.3 mg/dL — ABNORMAL HIGH (ref 0.6–1.1)
EGFR: 42 mL/min/{1.73_m2} — AB (ref >90)
GLUCOSE: 97 mg/dL (ref 70–140)
Potassium: 5.2 mEq/L — ABNORMAL HIGH (ref 3.5–5.1)
SODIUM: 138 meq/L (ref 136–145)
TOTAL PROTEIN: 7.3 g/dL (ref 6.4–8.3)

## 2015-11-09 LAB — TECHNOLOGIST REVIEW

## 2015-11-09 NOTE — Progress Notes (Signed)
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 Annabell Sabal performed a right lumpectomy and axillary lymph node dissection for what according to the patient and her family was a stage I invasive ductal breast cancer. She received radiation adjuvantly and then took tamoxifen for 5 years  In February of 2014 she had a normal screening mammography, but in November 2014 she felt a "hard lump" in her right breast. She brought this to the attention of her urologist, Dr. Izora [name], and he set her up for bilateral diagnostic mammography with mammography at [facility] 07/13/2013. This showed her breast density to be category B. There was no mammographic abnormality noted but ultrasonography showed a 1.4 cm irregularly shaped solid mass in the right breast at the 8:00 position. This was biopsied the same day, and showed ([id number]) an invasive ductal carcinoma, grade 2, estrogen receptor 100% positive, progesterone receptor 13% positive, with an MIB-1 of 33% and HER-2 amplification with a HER-2: CEP 17 ratio of 3.19, and a HER-2 copy number percent all of 4.15.  The patient's subsequent history is as detailed below  INTERVAL HISTORY: [name] [name] returns today for follow up of her recurrent right breast cancer, accompanied by her husband [name] [name]. She has been on anastrozole since May 2015 and tolerates this drug well. She has some hot flashes but denies vaginal changes. Her long history of fibromyalgia and arthritis continue, and are no worse or intense than before. The interval history is remarkable for a cough x 3 months.  She has worked with both a cardiologist and pulmonologist who have ruled out dysfunction with either organ system. She has been on antibiotics and steroids with no relief. The cough is not productive, sometime she can hear rattling in her throat. She is already on omeprazole and is not on any ACE inhibitors. She wonders if this is related to allergies.   REVIEW OF SYSTEMS: [name] [name] denies fevers, chills, nausea, or vomiting. She regularly had diarrhea. Her appetite is good. She is chronically fatigued. She is short of breath with exertion, but denies chest pain, or palpitations. She has occasional headaches. She is hard of hearing but denies vision changes. A detailed review of systems is otherwise stable.   PAST MEDICAL HISTORY: Past Medical History  Diagnosis Date  . Sinus tachycardia (HCC)     mild resting  . HLD (hyperlipidemia)   . Orthostatic hypotension     slight in the past  . Anxiety   . Depression   . Diverticulosis   . Fibromyalgia 1978  . Hypothyroidism   . Alopecia   . Ruptured disk     one in neck and two in back  . Plantar fasciitis   . GERD (gastroesophageal reflux disease)   . History of syncope     episode 2008--  no recurrence since  . Ureteral calculi     bilateral  . Leukocytosis   . Chronic back pain     "lower back and upper neck" (07/28/2013)  . 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 ear   . Urgency of urination   . Hematuria   . Sensation of  pressure in bladder area   . Wears glasses   . Ecchymosis     FROM IV AND LAB WORK THE PAST WEEK  06-17-2013  . Stool incontinence     "at times recently"   . Kidney stones 2014  . CAP (community acquired pneumonia)     admission 06-04-2013 secondary to failed oral antibitotics---  last cxr 06-16-2013  much improved  . 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 Breast cancer ([place]) 1997; 2014    right  . Ejection fraction   . SOB (shortness of breath) 06-17-2013  CURRENTLY RESIDUAL FROM RECENT CAP    new since sore throats- 12/2014    . Arthritis     "spine" (07/28/2013)  . DDD (degenerative disc disease)   . Deafness in left ear   . S/P chemotherapy, time since greater than 12 weeks     PAST SURGICAL HISTORY: Past Surgical History  Procedure Laterality Date  . Lumbar epidural injection      has had 7 injections  . Total abdominal hysterectomy w/ bilateral salpingoophorectomy  1997  . Breast lumpectomy with axillary lymph node dissection Right 1997  . Transthoracic echocardiogram  05-11-2007  dr [name] [name]    normal lvf/  ef 65%  . Tonsillectomy  AGE 35  . Retinal detachment surgery Right 2013  . Cystoscopy with retrograde pyelogram, ureteroscopy and stent placement Bilateral 06/17/2013    Procedure: CYSTOSCOPY WITH RETROGRADE PYELOGRAM, URETEROSCOPY AND LEFT DOUBLE  J STENT PLACEMENT RIGHT URETERAL HOLMIIUM LASER AND DOUBLE J STENT ;  Surgeon: [name] Ben, MD;  Location: [place];  Service: Urology;  Laterality: Bilateral;  . Holmium laser application Bilateral 55/37/4827    Procedure: HOLMIUM LASER APPLICATION;  Surgeon: [name] Ben, MD;  Location: [place];  Service: Urology;  Laterality: Bilateral;  . Mastectomy w/ nodes partial Right 1997  . Mastectomy complete / simple Right 07/28/2013  . Appendectomy  1997  . Breast biopsy Right 2014  . Cystoscopy with ureteroscopy, stone basketry and stent placement Bilateral 2014  . Lithotripsy Left ~ 06/2013  . Simple mastectomy with axillary sentinel node biopsy Right 07/28/2013    Procedure: RIGHT TOTAL  MASTECTOMY;  Surgeon: [name] Burn. Georgette Dover, MD;  Location: [place];  Service: General;  Laterality: Right;  . Portacath placement Left 07/28/2013    Procedure: ATTEMPTED INSERTION PORT-A-CATH;  Surgeon: [name] Burn. Georgette Dover, MD;  Location: [place];  Service: General;  Laterality: Left;  . Port-a-cath removal Left 10/03/2014    Procedure: REMOVAL PORT-A-CATH;  Surgeon: [name] Mesa, MD;  Location: [place];  Service: General;  Laterality: Left;  . Abdominal hysterectomy    . Robotic assited partial nephrectomy Right 02/05/2015    Procedure: ROBOTIC ASSITED PARTIAL NEPHRECTOMY;  Surgeon: Raynelle Bring, MD;  Location: [facility];  Service: Urology;  Laterality: Right;    FAMILY HISTORY Family History  Problem Relation Age of Onset  . Heart disease Maternal Grandfather   . Diabetes Maternal Grandfather   . Colon cancer Maternal Aunt 79  . Brain cancer Paternal Grandmother     dx in 24s  . Dementia Mother   . Diabetes Mother   . Osteoporosis Mother   . Diabetes Maternal Aunt   . Prostate cancer Father 51  . Bipolar disorder Maternal Aunt   . Stroke Maternal Aunt   . Stomach cancer Paternal Uncle     dx in late 23s  the patient's mother died at the age of 12. The patient's father is alive at age 57. She had no brothers or sisters. Her father has a history of prostate cancer. There is no history of breast or ovarian cancer in the family. One maternal first cousin has a history of Hodgkin's lymphoma.  GYNECOLOGIC HISTORY:  Menarche age 80, first live birth age 62. She is GX P1. She underwent total abdominal hysterectomy and bilateral salpingo-oophorectomy in 1997 she did not take hormone replacement.  SOCIAL HISTORY: (Updated  11/15/2013)  She is a homemaker. Her husband Arnette Norris farms approximately 500 acres. Daughter Colletta [place] lives in [place] where she works as a Pension scheme manager for a drug company. The patient has no grandchildren. She has two "grand cats". She attends a local united church of [place]: Not in place   HEALTH MAINTENANCE:  (Updated   11/15/2013 ) Social History  Substance Use Topics  . Smoking status: Never Smoker   . Smokeless tobacco: Never Used  . Alcohol Use: No     Comment: 07/28/2013 "no alcohol since 1994; never had problem w/it"     Colonoscopy: 2009/ [facility]  PAP:  Status post hysterectomy  Bone density: May 10/14/2009 at [facility] was normal  Lipid panel:  Not on file   Allergies  Allergen Reactions  . Lasix [Furosemide] Nausea And Vomiting and Other (See Comments)    headache  . Lithium Other (See Comments)    Dizziness, "cause me to fall"  . Sulfa Antibiotics Hives  . Trazodone And Nefazodone Other (See Comments)    insomnia  . Lyrica [Pregabalin] Diarrhea, Nausea Only and Rash    Current Outpatient Prescriptions  Medication Sig Dispense Refill  . ALPRAZolam (XANAX) 0.5 MG tablet TAKE 1 TABLET EVERY 8 HOURS AS NEEDED (Patient taking differently: TAKE 1 TABLET BY MOUTH EVERY TWICE DAILY.) 30 tablet 0  . amitriptyline (ELAVIL) 150 MG tablet Take 150 mg by mouth at bedtime.    Marland Kitchen anastrozole (ARIMIDEX) 1 MG tablet TAKE 1 TABLET (1 MG TOTAL) BY MOUTH DAILY. 90 tablet 1  . aspirin 81 MG tablet Take 81 mg by mouth daily.    . Cholecalciferol (VITAMIN D-3 PO) Take 1 capsule by mouth daily. Reported on 09/25/2015    . gabapentin (NEURONTIN) 300 MG capsule TK 1 C PO QHS  4  . levothyroxine (SYNTHROID, LEVOTHROID) 50 MCG tablet Take 1 tablet (50 mcg total) by mouth daily before breakfast. 90 tablet 4  . metoprolol succinate (TOPROL-XL) 25 MG 24 hr tablet TAKE 1 TABLET (25 MG TOTAL) BY MOUTH DAILY. 30 tablet 11  . minoxidil (ROGAINE) 2 % external solution Apply 1 application topically 2 (two) times daily as needed (hair.).     Marland Kitchen Multiple Vitamins-Minerals (HAIR/SKIN/NAILS PO) Take 1 tablet by mouth daily.    Marland Kitchen omeprazole (PRILOSEC) 40 MG capsule Take 1 capsule (40 mg total) by mouth every morning. 90 capsule 1  . Respiratory Therapy Supplies (FLUTTER) DEVI Use as directed 1 each 0  . REXULTI 0.5 MG TABS Take 0.5 mg by mouth at bedtime.   0  . REXULTI 2 MG TABS Reported on 10/09/2015  4  . VIIBRYD 40 MG TABS Take 40 mg by mouth daily.   1  . benzonatate (TESSALON) 100 MG capsule Take 1-2 capsules (100-200 mg total) by mouth 3 (three) times daily as needed for  cough. (Patient not taking: Reported on 11/09/2015) 40 capsule 0   No current facility-administered medications for this  visit.    OBJECTIVE: 70 year old white woman who appears stated age 52 Vitals:   11/09/15 1351  BP: 124/68  Pulse: 90  Temp: 98.1 F (36.7 C)  Resp: 18     Body mass index is 34.5 kg/(m^2).    ECOG FS:1 - Symptomatic but completely ambulatory Filed Weights   11/09/15 1351  Weight: 201 lb 1.6 oz (91.218 kg)   Skin: warm, dry  HEENT: sclerae anicteric, conjunctivae pink, oropharynx clear. No thrush or mucositis.  Lymph Nodes: No cervical or supraclavicular lymphadenopathy  Lungs: clear to auscultation bilaterally, no rales, wheezes, or rhonci  Heart: regular rate and rhythm  Abdomen: round, soft, mild discomfort to right upper quadrant with palpations, positive bowel sounds  Musculoskeletal: No focal spinal tenderness, no peripheral edema  Neuro: non focal, well oriented, positive affect  Breasts: right breast status post mastectomy. No evidence of recurrent disease. Right axilla benign. Left breast unremarkable.  LAB RESULTS:   Lab Results  Component Value Date   WBC 38.3* 11/09/2015   NEUTROABS 8.1* 11/09/2015   HGB 12.2 11/09/2015   HCT 39.7 11/09/2015   MCV 83.0 11/09/2015   PLT 199 11/09/2015      Chemistry      Component Value Date/Time   NA 138 11/09/2015 1327   NA 142 08/02/2015 1529   K 5.2* 11/09/2015 1327   K 4.3 08/02/2015 1529   CL 111 08/02/2015 1529   CO2 25 11/09/2015 1327   CO2 22 08/02/2015 1529   BUN 20.6 11/09/2015 1327   BUN 11 08/02/2015 1529   CREATININE 1.3* 11/09/2015 1327   CREATININE 1.00 08/02/2015 1529   CREATININE 1.15* 12/07/2014 1538      Component Value Date/Time   CALCIUM 10.0 11/09/2015 1327   CALCIUM 9.4 08/02/2015 1529   ALKPHOS 97 11/09/2015 1327   ALKPHOS 75 12/07/2014 1538   AST 40* 11/09/2015 1327   AST 32 12/07/2014 1538   ALT 68* 11/09/2015 1327   ALT 38* 12/07/2014 1538   BILITOT <0.30  11/09/2015 1327   BILITOT 0.3 12/07/2014 1538      STUDIES: No results found.   ASSESSMENT: 70 y.o. BRCA negative [place] woman  (1) status post right lumpectomy and axillary lymph node dissection in 1997 for a stage I breast cancer, treated with adjuvant radiation and tamoxifen for 5 years  (2) status post right breast biopsy 07/13/2013 for a clinical T1c N0, stage IA invasive ductal carcinoma, grade 2, estrogen receptor 100% positive, progesterone receptor 13% positive, with an MIB-1 of 33%, and HER-2 amplification by CISH with a HER2/CEP 17 ratio of 3.19, and an average HER-2 copy number per cell of 4.15  (3) status post right mastectomy 07/28/2013 for a pT1c pN0, stage IA invasive ductal carcinoma, grade 3, with close but negative margins. Prognostic panel was not repeated  (4) completed weekly paclitaxel x12 12/06/2913, with trastuzumab/ pertuzumab every 3 weeks; pertuzumab was held with third dose on 11/01/2013 due to diarrhea, tried a half dose on cycle 4 again with diarrhea developing  (5) trastuzumab (started 09/20/2013) to be continued for 1 year; most recent echocardiogram on 06/02/2014  shows a well preserved ejection fraction  (6) anastrozole started May 2015; bone density 12/15/2013 normal   OTHER PROBLEMS:  (a) History of chronic lymphoid leukemia diagnosed by flow cytometry 06/29/2013, the cells being CD5, CD20 and CD23 positive, CD10 negative.   (1) right renal mass resected on 02/05/15, consisting of atypical lymphoid proliferation composed of monotonous small lymphocytes   (  b) Anemia with a normal MCV and normal ferritin-- B-12 and folate normal  (c) the patient met with Dr. Harlow Mares and has decided against reconstruction  PLAN: As far as her breast cancer is concerned, Shanda is doing well. She is now 2 years out from her right mastectomy with no evidence of recurrent disease. She is tolerating the anastrozole well and will continue this drug for 5 years of  antiestrogen therapy. She is due for a mammogram and bone density scan this spring.  The labs were reviewed in detail and her WBC is elevated above usual for her history of CLL. She is finishing up a steroid taper today however, and this has likely contributed to the increased counts. She denies fevers, chills, night sweats, or excessive fatigue.   Her cough has been thoroughly investigated and cardiac and pulmonary etiologies were ruled out. She has been advised by Dr. Melvyn Novas that she needs to follow up with an ENT specialist and she already has the contact information for this person.   Karn will return in 4 months for a follow up visit with Dr. Jana Hakim. She understands and agrees with this plan. She has been encouraged to call with any issues that might arise before her next visit here.   Laurie Panda, NP  11/09/2015 4:23 PM

## 2015-11-09 NOTE — Telephone Encounter (Signed)
Left message for pt to call back  °

## 2015-11-09 NOTE — Telephone Encounter (Signed)
appt made and avs printed °

## 2015-11-09 NOTE — Addendum Note (Signed)
Addended by: Marcelino Duster on: 11/09/2015 04:42 PM   Modules accepted: Orders

## 2015-11-12 ENCOUNTER — Telehealth: Payer: Self-pay | Admitting: Nurse Practitioner

## 2015-11-12 NOTE — Telephone Encounter (Signed)
Spoke with pt, advised message from Dr. Everlene Farrier. She states Dr. Melvyn Novas advised her that her lungs were fine. She states Dr. Melvyn Novas thought she was having acid reflux. She takes omeprazole and it helps. She states you wanted to get her an appointment with Dr. Joya Gaskins at [facility] you want to refer?

## 2015-11-12 NOTE — Telephone Encounter (Signed)
Spoke with patient to confirm [facility] appt for bone density and mammogram on May 15 with 115 pm arrival. Orders faxed to clinic

## 2015-11-12 NOTE — Telephone Encounter (Signed)
It is fine to put in a referral to any physician she would like to see at [facility] in the pulmonary department.

## 2015-11-13 ENCOUNTER — Other Ambulatory Visit: Payer: Self-pay | Admitting: Oncology

## 2015-11-13 NOTE — Telephone Encounter (Signed)
Referral placed.

## 2015-11-17 ENCOUNTER — Other Ambulatory Visit: Payer: Self-pay | Admitting: Oncology

## 2015-11-23 ENCOUNTER — Telehealth: Payer: Self-pay

## 2015-11-23 ENCOUNTER — Ambulatory Visit (INDEPENDENT_AMBULATORY_CARE_PROVIDER_SITE_OTHER): Payer: Medicare Other | Admitting: Family Medicine

## 2015-11-23 VITALS — BP 130/76 | HR 114 | Temp 98.6°F | Resp 16 | Ht 63.5 in | Wt 203.8 lb

## 2015-11-23 DIAGNOSIS — R05 Cough: Secondary | ICD-10-CM

## 2015-11-23 DIAGNOSIS — Z23 Encounter for immunization: Secondary | ICD-10-CM | POA: Diagnosis not present

## 2015-11-23 DIAGNOSIS — H66005 Acute suppurative otitis media without spontaneous rupture of ear drum, recurrent, left ear: Secondary | ICD-10-CM | POA: Diagnosis not present

## 2015-11-23 DIAGNOSIS — H9192 Unspecified hearing loss, left ear: Secondary | ICD-10-CM

## 2015-11-23 DIAGNOSIS — S90511A Abrasion, right ankle, initial encounter: Secondary | ICD-10-CM | POA: Diagnosis not present

## 2015-11-23 DIAGNOSIS — R059 Cough, unspecified: Secondary | ICD-10-CM

## 2015-11-23 MED ORDER — METHYLPREDNISOLONE 4 MG PO TABS: 4.0000 mg | ORAL_TABLET | Freq: Two times a day (BID) | ORAL | Status: DC

## 2015-11-23 MED ORDER — LEVOFLOXACIN 500 MG PO TABS: 500.0000 mg | ORAL_TABLET | Freq: Every day | ORAL | Status: DC

## 2015-11-23 NOTE — Telephone Encounter (Signed)
error 

## 2015-11-23 NOTE — Addendum Note (Signed)
Addended by: [name] on: 11/23/2015 05:12 PM   Modules accepted: Orders, SmartSet

## 2015-11-23 NOTE — Patient Instructions (Signed)
Please let me know if the cough and loss of hearing are not improving by Monday

## 2015-11-23 NOTE — Progress Notes (Addendum)
Patient ID: [name] [name] MRN: [id number], DOB: 05-02-1946, 70 y.o. Date of Encounter: 11/23/2015, 5:11 PM  Primary Physician: [name], MD  By signing my name below, I, [name] [name], attest that this documentation has been prepared under the direction and in the presence of [name], MD. Electronically Signed: Judithe [name], ER Scribe. 11/23/2015. 5:11 PM.  Chief Complaint  Patient presents with  . Cough    x 4 months  . Ear Pain    on/off  . Sore Throat    slightly this AM    HPI: 70 y.o. year old female with history below presents with Loss of hearing for 2 days in the left ear in the context of having deafness in the right and having a history of significant hearing loss in the left after shooting firearms. She has some discomfort in that left ear and has sinus congestion over the last week.  Patient recently went to the audiologist wear her hearing aid was adjusted but she still having difficulty hearing out of the left ear.  Patient also has a cough for the last 4 months. She seen a cardiologist who said the problem was pulmonary, and a pulmonologist to set the problem was her throat. She denies GERD symptoms at present.  Patient also injured her right inner ankle when she was in the barn several days ago. The wound on the ankle cut the skin on a piece of farm machinery. She is unsure when her last technical shot was   Past Medical History  Diagnosis Date  . Sinus tachycardia (HCC)     mild resting  . HLD (hyperlipidemia)   . Orthostatic hypotension     slight in the past  . Anxiety   . Depression   . Diverticulosis   . Fibromyalgia 1978  . Hypothyroidism   . Alopecia   . Ruptured disk     one in neck and two in back  . Plantar fasciitis   . GERD (gastroesophageal reflux disease)   . History of syncope     episode 2008--  no recurrence since  . Ureteral calculi     bilateral  . Leukocytosis   . Chronic back pain     "lower back and upper  neck" (07/28/2013)  . 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 ear   . Urgency of urination   . Hematuria   . Sensation of pressure in bladder area   . Wears glasses   . Ecchymosis     FROM IV AND LAB WORK THE PAST WEEK  06-17-2013  . Stool incontinence     "at times recently"   . Kidney stones 2014  . CAP (community acquired pneumonia)     admission 06-04-2013 secondary to failed oral antibitotics---  last cxr 06-16-2013  much improved  . 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 Breast cancer ([place]) 1997; 2014    right  . Ejection fraction   . SOB (shortness of breath) 06-17-2013 CURRENTLY RESIDUAL FROM RECENT CAP    new since sore throats- 12/2014    . Arthritis     "spine" (07/28/2013)  . DDD (degenerative disc disease)   . Deafness in left ear   . S/P chemotherapy, time since greater than 12 weeks      Home Meds: Prior to Admission medications   Medication Sig Start Date End Date Taking? Authorizing Provider  ALPRAZolam Duanne Moron) 0.5 MG tablet  TAKE 1 TABLET EVERY 8 HOURS AS NEEDED Patient taking differently: TAKE 1 TABLET BY MOUTH EVERY TWICE DAILY.   Yes Darlyne Russian, MD  amitriptyline (ELAVIL) 150 MG tablet Take 150 mg by mouth at bedtime.   Yes Historical Provider, MD  anastrozole (ARIMIDEX) 1 MG tablet TAKE 1 TABLET (1 MG TOTAL) BY MOUTH DAILY. 09/12/15  Yes Chauncey Cruel, MD  anastrozole (ARIMIDEX) 1 MG tablet TAKE 1 TABLET BY MOUTH EVERY DAY 11/14/15  Yes Chauncey Cruel, MD  anastrozole (ARIMIDEX) 1 MG tablet TAKE 1 TABLET BY MOUTH EVERY DAY 11/18/15  Yes Chauncey Cruel, MD  aspirin 81 MG tablet Take 81 mg by mouth daily.   Yes Historical Provider, MD  Cholecalciferol (VITAMIN D-3 PO) Take 1 capsule by mouth daily. Reported on 09/25/2015   Yes Historical Provider, MD  gabapentin (NEURONTIN) 300 MG capsule TK 1 C PO QHS 09/26/15  Yes Historical Provider, MD  levothyroxine (SYNTHROID, LEVOTHROID) 50 MCG tablet Take 1 tablet (50  mcg total) by mouth daily before breakfast. 10/09/15  Yes Megan Salon, MD  minoxidil (ROGAINE) 2 % external solution Apply 1 application topically 2 (two) times daily as needed (hair.).    Yes Historical Provider, MD  Multiple Vitamins-Minerals (HAIR/SKIN/NAILS PO) Take 1 tablet by mouth daily.   Yes Historical Provider, MD  omeprazole (PRILOSEC) 40 MG capsule Take 1 capsule (40 mg total) by mouth every morning. 09/12/15  Yes Chauncey Cruel, MD  REXULTI 0.5 MG TABS Take 0.5 mg by mouth at bedtime.  11/05/14  Yes Historical Provider, MD  REXULTI 2 MG TABS Reported on 10/09/2015 09/27/15  Yes Historical Provider, MD  VIIBRYD 40 MG TABS Take 40 mg by mouth daily.  11/13/14  Yes Historical Provider, MD  benzonatate (TESSALON) 100 MG capsule Take 1-2 capsules (100-200 mg total) by mouth 3 (three) times daily as needed for cough. Patient not taking: Reported on 11/09/2015 10/09/15   Ezekiel Slocumb, PA-C  metoprolol succinate (TOPROL-XL) 25 MG 24 hr tablet TAKE 1 TABLET (25 MG TOTAL) BY MOUTH DAILY. Patient not taking: Reported on 11/23/2015 02/07/15   Carlena Bjornstad, MD  Respiratory Therapy Supplies (FLUTTER) DEVI Use as directed Patient not taking: Reported on 11/23/2015 10/17/15   Tanda Rockers, MD    Allergies:  Allergies  Allergen Reactions  . Lasix [Furosemide] Nausea And Vomiting and Other (See Comments)    headache  . Lithium Other (See Comments)    Dizziness, "cause me to fall"  . Sulfa Antibiotics Hives  . Trazodone And Nefazodone Other (See Comments)    insomnia  . Lyrica [Pregabalin] Diarrhea, Nausea Only and Rash    Social History   Social History  . Marital Status: Married    Spouse Name: N/A  . Number of Children: 1  . Years of Education: N/A   Occupational History  . homemaker    Social History Main Topics  . Smoking status: Never Smoker   . Smokeless tobacco: Never Used  . Alcohol Use: No     Comment: 07/28/2013 "no alcohol since 1994; never had problem w/it"  . Drug Use:  No  . Sexual Activity: No     Comment: TAH/BSO   Other Topics Concern  . Not on file   Social History Narrative     Review of Systems: Constitutional: negative for chills, fever, night sweats, weight changes, or fatigue  HEENT: negative for vision changes, hearing loss, congestion, rhinorrhea, ST, epistaxis, or sinus pressure Cardiovascular: negative  for chest pain or palpitations Respiratory: negative for hemoptysis, wheezing, shortness of breath, or cough Abdominal: negative for abdominal pain, nausea, vomiting, diarrhea, or constipation Dermatological: negative for rash Neurologic: negative for headache, dizziness, or syncope All other systems reviewed and are otherwise negative with the exception to those above and in the HPI.   Physical Exam: Blood pressure 130/76, pulse 114, temperature 98.6 F (37 C), temperature source Oral, resp. rate 16, height 5' 3.5" (1.613 m), weight 203 lb 12.8 oz (92.443 kg), last menstrual period 08/26/1995, SpO2 98 %., Body mass index is 35.53 kg/(m^2). General: Well developed, well nourished, in no acute distress. Head: Normocephalic, atraumatic, eyes without discharge, sclera non-icteric, nares are without discharge. Bilateral auditory canals clear, with the right TM appearing normal and the left TM bulging with an amber fluid posteriorly and white fluid superiorly Oral cavity moist, posterior pharynx without exudate, erythema, peritonsillar abscess, or post nasal drip.  Neck: Supple. No thyromegaly. Full ROM. No lymphadenopathy. Lungs: Clear bilaterally to auscultation without wheezes, rales, or rhonchi. Breathing is unlabored. Heart: RRR with S1 S2. No murmurs, rubs, or gallops appreciated. Abdomen: Soft, non-tender, non-distended with normoactive bowel sounds. No hepatomegaly. No rebound/guarding. No obvious abdominal masses. Msk:  Strength and tone normal for age. Extremities/Skin: Warm and dry. No clubbing or cyanosis. No edema. No rashes or  suspicious lesions. Neuro: Alert and oriented X 3. Moves all extremities spontaneously. Gait is normal. CNII-XII grossly in tact. Psych:  Responds to questions appropriately with a normal affect.      ASSESSMENT AND PLAN:  70 y.o. year old female with Hearing loss, left - Plan: methylPREDNISolone (MEDROL) 4 MG tablet  Recurrent acute suppurative otitis media without spontaneous rupture of left tympanic membrane - Plan: levofloxacin (LEVAQUIN) 500 MG tablet  Cough - Plan: methylPREDNISolone (MEDROL) 4 MG tablet  Abrasion of right ankle, initial encounter - Plan: Tdap vaccine greater than or equal to 7yo IM  This chart was scribed in my presence and reviewed by me personally.    Signed, [name], MD 11/23/2015 5:11 PM

## 2015-11-26 ENCOUNTER — Telehealth: Payer: Self-pay | Admitting: Physician Assistant

## 2015-11-26 DIAGNOSIS — H9193 Unspecified hearing loss, bilateral: Secondary | ICD-10-CM

## 2015-11-26 NOTE — Telephone Encounter (Signed)
Dr. Joseph Art told pt to call back if not better. She still cannot hear.  Please advise  [phone]

## 2015-11-28 ENCOUNTER — Other Ambulatory Visit: Payer: Self-pay | Admitting: Family Medicine

## 2015-11-28 NOTE — Telephone Encounter (Signed)
Patient needs to see ENT

## 2015-11-29 ENCOUNTER — Other Ambulatory Visit: Payer: Self-pay | Admitting: Family Medicine

## 2015-11-30 ENCOUNTER — Encounter: Payer: Self-pay | Admitting: Genetic Counselor

## 2015-11-30 DIAGNOSIS — Z1379 Encounter for other screening for genetic and chromosomal anomalies: Secondary | ICD-10-CM | POA: Insufficient documentation

## 2016-01-09 ENCOUNTER — Telehealth: Payer: Self-pay | Admitting: Genetic Counselor

## 2016-01-09 NOTE — Telephone Encounter (Signed)
LM on VM with good news on updated test results.  Asked that she call back and gave phone number.

## 2016-01-17 DIAGNOSIS — R0602 Shortness of breath: Secondary | ICD-10-CM | POA: Diagnosis not present

## 2016-01-17 DIAGNOSIS — R0789 Other chest pain: Secondary | ICD-10-CM | POA: Diagnosis not present

## 2016-01-17 DIAGNOSIS — Z923 Personal history of irradiation: Secondary | ICD-10-CM | POA: Diagnosis not present

## 2016-01-17 DIAGNOSIS — I10 Essential (primary) hypertension: Secondary | ICD-10-CM | POA: Diagnosis not present

## 2016-01-25 DIAGNOSIS — Z6835 Body mass index (BMI) 35.0-35.9, adult: Secondary | ICD-10-CM | POA: Diagnosis not present

## 2016-01-25 DIAGNOSIS — I10 Essential (primary) hypertension: Secondary | ICD-10-CM | POA: Diagnosis not present

## 2016-01-25 DIAGNOSIS — M5136 Other intervertebral disc degeneration, lumbar region: Secondary | ICD-10-CM | POA: Diagnosis not present

## 2016-01-30 ENCOUNTER — Ambulatory Visit (INDEPENDENT_AMBULATORY_CARE_PROVIDER_SITE_OTHER): Payer: Medicare Other | Admitting: Family Medicine

## 2016-01-30 VITALS — BP 122/80 | HR 111 | Temp 98.2°F | Resp 18 | Ht 63.5 in | Wt 203.0 lb

## 2016-01-30 DIAGNOSIS — N3000 Acute cystitis without hematuria: Secondary | ICD-10-CM

## 2016-01-30 DIAGNOSIS — R350 Frequency of micturition: Secondary | ICD-10-CM | POA: Diagnosis not present

## 2016-01-30 LAB — POCT URINALYSIS DIP (MANUAL ENTRY)
Bilirubin, UA: NEGATIVE
GLUCOSE UA: NEGATIVE
Ketones, POC UA: NEGATIVE
NITRITE UA: NEGATIVE
PROTEIN UA: NEGATIVE
SPEC GRAV UA: 1.01
UROBILINOGEN UA: 0.2
pH, UA: 6.5

## 2016-01-30 LAB — POC MICROSCOPIC URINALYSIS (UMFC): MUCUS RE: ABSENT

## 2016-01-30 MED ORDER — CEPHALEXIN 500 MG PO CAPS: 500.0000 mg | ORAL_CAPSULE | Freq: Two times a day (BID) | ORAL | Status: DC

## 2016-01-30 NOTE — Patient Instructions (Addendum)
   IF you received an x-ray today, you will receive an invoice from [facility]. Please contact [facility] at [phone] with questions or concerns regarding your invoice.   IF you received labwork today, you will receive an invoice from [facility]. Please contact [facility] at [phone] with questions or concerns regarding your invoice.   Our billing staff will not be able to assist you with questions regarding bills from these companies.  You will be contacted with the lab results as soon as they are available. The fastest way to get your results is to activate your My Chart account. Instructions are located on the last page of this paperwork. If you have not heard from us regarding the results in 2 weeks, please contact this office.     Urinary Tract Infection Urinary tract infections (UTIs) can develop anywhere along your urinary tract. Your urinary tract is your body's drainage system for removing wastes and extra water. Your urinary tract includes two kidneys, two ureters, a bladder, and a urethra. Your kidneys are a pair of bean-shaped organs. Each kidney is about the size of your fist. They are located below your ribs, one on each side of your spine. CAUSES Infections are caused by microbes, which are microscopic organisms, including fungi, viruses, and bacteria. These organisms are so small that they can only be seen through a microscope. Bacteria are the microbes that most commonly cause UTIs. SYMPTOMS  Symptoms of UTIs may vary by age and gender of the patient and by the location of the infection. Symptoms in young women typically include a frequent and intense urge to urinate and a painful, burning feeling in the bladder or urethra during urination. Older women and men are more likely to be tired, shaky, and weak and have muscle aches and abdominal pain. A fever may mean the infection is in your kidneys. Other symptoms of a kidney  infection include pain in your back or sides below the ribs, nausea, and vomiting. DIAGNOSIS To diagnose a UTI, your caregiver will ask you about your symptoms. Your caregiver will also ask you to provide a urine sample. The urine sample will be tested for bacteria and white blood cells. White blood cells are made by your body to help fight infection. TREATMENT  Typically, UTIs can be treated with medication. Because most UTIs are caused by a bacterial infection, they usually can be treated with the use of antibiotics. The choice of antibiotic and length of treatment depend on your symptoms and the type of bacteria causing your infection. HOME CARE INSTRUCTIONS  If you were prescribed antibiotics, take them exactly as your caregiver instructs you. Finish the medication even if you feel better after you have only taken some of the medication.  Drink enough water and fluids to keep your urine clear or pale yellow.  Avoid caffeine, tea, and carbonated beverages. They tend to irritate your bladder.  Empty your bladder often. Avoid holding urine for long periods of time.  Empty your bladder before and after sexual intercourse.  After a bowel movement, women should cleanse from front to back. Use each tissue only once. SEEK MEDICAL CARE IF:   You have back pain.  You develop a fever.  Your symptoms do not begin to resolve within 3 days. SEEK IMMEDIATE MEDICAL CARE IF:   You have severe back pain or lower abdominal pain.  You develop chills.  You have nausea or vomiting.  You have continued burning or discomfort with urination.   MAKE SURE YOU:   Understand these instructions.  Will watch your condition.  Will get help right away if you are not doing well or get worse.   This information is not intended to replace advice given to you by your health care provider. Make sure you discuss any questions you have with your health care provider.   Document Released: 05/21/2005 Document  Revised: 05/02/2015 Document Reviewed: 09/19/2011 Elsevier Interactive Patient Education 2016 Elsevier Inc.  

## 2016-01-30 NOTE — Progress Notes (Signed)
By signing my name below, I, Mesha Guinyard, attest that this documentation has been prepared under the direction and in the presence of Delman Cheadle, MD.  Electronically Signed: Verlee Monte, Medical Scribe. 01/30/2016. 3:46 PM.  Subjective:    Patient ID: [name] [name], female    DOB: 08-30-1945, 70 y.o.   MRN: [id number]  HPI Chief Complaint  Patient presents with  . Urinary Frequency    x3 days; states it may be a UTI  . urinary burning    as well as pressure    HPI Comments: [name] [name] is a 70 y.o. female who presents to the [facility] complaining of dysuria 2 days ago. Pt reports pressure in her lower abdomen, back pain but no changes from her baseline, right lower flank pain and right lower quad pain. Pt drank lots of water today to subside symptoms. Pt takes vitamins regularly but not for her UTI. Pt thinks her urine has been slightly cloudy. Pt denies getting frequent UTI. Pt was on levoquin about 2 months ago for ear infection, sinus infection, and bronchitis. Pt has been coughing but thinks it's due to her husbands cats.  Last urine culture was Aug 2015- intermediate sensitivity to nitrofurantoin. Pt had breast CA and had a mastectomy done in the past. Pt was dx with CLL.  Patient Active Problem List   Diagnosis Date Noted  . Genetic testing 11/30/2015  . Essential hypertension 10/18/2015  . Morbid obesity ([place]) 10/18/2015  . Upper airway cough syndrome 10/17/2015  . Renal mass 02/05/2015  . Ejection fraction   . Anemia, unspecified 10/25/2013  . Diarrhea 10/04/2013  . GERD (gastroesophageal reflux disease) 09/27/2013  . Seroma complicating a procedure 08/12/2013  . CLL (chronic lymphocytic leukemia) ([place]) 07/27/2013  . Breast cancer of lower-outer quadrant of right female breast ([place]) 07/26/2013  . Nephrolithiasis 06/16/2013  . 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 Chronic pain syndrome 05/10/2013  . H/O long-term treatment with high-risk  medication 05/10/2013  . DDD (degenerative disc disease) 05/10/2013  . SOB (shortness of breath)   . Sinus tachycardia ([place])   . HLD (hyperlipidemia)   . Orthostatic hypotension   . Anxiety   . Arthritis   . Diverticulosis   . Fibromyalgia   . Hypothyroidism    Past Medical History  Diagnosis Date  . Sinus tachycardia (HCC)     mild resting  . HLD (hyperlipidemia)   . Orthostatic hypotension     slight in the past  . Anxiety   . Depression   . Diverticulosis   . Fibromyalgia 1978  . Hypothyroidism   . Alopecia   . Ruptured disk     one in neck and two in back  . Plantar fasciitis   . GERD (gastroesophageal reflux disease)   . History of syncope     episode 2008--  no recurrence since  . Ureteral calculi     bilateral  . Leukocytosis   . Chronic back pain     "lower back and upper neck" (07/28/2013)  . 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 ear   . Urgency of urination   . Hematuria   . Sensation of pressure in bladder area   . Wears glasses   . Ecchymosis     FROM IV AND LAB WORK THE PAST WEEK  06-17-2013  . Stool incontinence     "at times recently"   . Kidney stones 2014  . CAP (community acquired pneumonia)     admission  06-04-2013 secondary to failed oral antibitotics---  last cxr 06-16-2013  much improved  . 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 Breast cancer ([place]) 1997; 2014    right  . Ejection fraction   . SOB (shortness of breath) 06-17-2013 CURRENTLY RESIDUAL FROM RECENT CAP    new since sore throats- 12/2014    . Arthritis     "spine" (07/28/2013)  . DDD (degenerative disc disease)   . Deafness in left ear   . S/P chemotherapy, time since greater than 12 weeks    Past Surgical History  Procedure Laterality Date  . Lumbar epidural injection      has had 7 injections  . Total abdominal hysterectomy w/ bilateral salpingoophorectomy  1997  . Breast lumpectomy with axillary lymph node dissection Right  1997  . Transthoracic echocardiogram  05-11-2007  dr Ron Parker    normal lvf/  ef 65%  . Tonsillectomy  AGE 69  . Retinal detachment surgery Right 2013  . Cystoscopy with retrograde pyelogram, ureteroscopy and stent placement Bilateral 06/17/2013    Procedure: CYSTOSCOPY WITH RETROGRADE PYELOGRAM, URETEROSCOPY AND LEFT DOUBLE  J STENT PLACEMENT RIGHT URETERAL HOLMIIUM LASER AND DOUBLE J STENT ;  Surgeon: Hanley Ben, MD;  Location: [facility];  Service: Urology;  Laterality: Bilateral;  . Holmium laser application Bilateral [id number]    Procedure: HOLMIUM LASER APPLICATION;  Surgeon: Hanley Ben, MD;  Location: [facility];  Service: Urology;  Laterality: Bilateral;  . Mastectomy w/ nodes partial Right 1997  . Mastectomy complete / simple Right 07/28/2013  . Appendectomy  1997  . Breast biopsy Right 2014  . Cystoscopy with ureteroscopy, stone basketry and stent placement Bilateral 2014  . Lithotripsy Left ~ 06/2013  . Simple mastectomy with axillary sentinel node biopsy Right 07/28/2013    Procedure: RIGHT TOTAL  MASTECTOMY;  Surgeon: Imogene Burn. Georgette Dover, MD;  Location: [facility];  Service: General;  Laterality: Right;  . Portacath placement Left 07/28/2013    Procedure: ATTEMPTED INSERTION PORT-A-CATH;  Surgeon: Imogene Burn. Georgette Dover, MD;  Location: [facility];  Service: General;  Laterality: Left;  . Port-a-cath removal Left 10/03/2014    Procedure: REMOVAL PORT-A-CATH;  Surgeon: Donnie Mesa, MD;  Location: [facility];  Service: General;  Laterality: Left;  . Abdominal hysterectomy    . Robotic assited partial nephrectomy Right 02/05/2015    Procedure: ROBOTIC ASSITED PARTIAL NEPHRECTOMY;  Surgeon: Raynelle Bring, MD;  Location: [facility];  Service: Urology;  Laterality: Right;   Allergies  Allergen Reactions  . Lasix [Furosemide] Nausea And Vomiting and Other (See Comments)    headache  . Lithium Other (See Comments)    Dizziness, "cause me to fall"  . Sulfa  Antibiotics Hives  . Trazodone And Nefazodone Other (See Comments)    insomnia  . Lyrica [Pregabalin] Diarrhea, Nausea Only and Rash   Prior to Admission medications   Medication Sig Start Date End Date Taking? Authorizing Provider  ALPRAZolam (XANAX) 0.5 MG tablet TAKE 1 TABLET EVERY 8 HOURS AS NEEDED Patient taking differently: TAKE 1 TABLET BY MOUTH EVERY TWICE DAILY.   Yes Darlyne Russian, MD  amitriptyline (ELAVIL) 150 MG tablet Take 150 mg by mouth at bedtime.   Yes Historical Provider, MD  anastrozole (ARIMIDEX) 1 MG tablet TAKE 1 TABLET (1 MG TOTAL) BY MOUTH DAILY. 09/12/15  Yes Chauncey Cruel, MD  anastrozole (ARIMIDEX) 1 MG tablet TAKE 1  TABLET BY MOUTH EVERY DAY 11/14/15  Yes Chauncey Cruel, MD  anastrozole (ARIMIDEX) 1 MG tablet TAKE 1 TABLET BY MOUTH EVERY DAY 11/18/15  Yes Chauncey Cruel, MD  aspirin 81 MG tablet Take 81 mg by mouth daily.   Yes Historical Provider, MD  Cholecalciferol (VITAMIN D-3 PO) Take 1 capsule by mouth daily. Reported on 09/25/2015   Yes Historical Provider, MD  gabapentin (NEURONTIN) 300 MG capsule TK 1 C PO QHS 09/26/15  Yes Historical Provider, MD  levothyroxine (SYNTHROID, LEVOTHROID) 50 MCG tablet Take 1 tablet (50 mcg total) by mouth daily before breakfast. 10/09/15  Yes Megan Salon, MD  minoxidil (ROGAINE) 2 % external solution Apply 1 application topically 2 (two) times daily as needed (hair.).    Yes Historical Provider, MD  Multiple Vitamins-Minerals (HAIR/SKIN/NAILS PO) Take 1 tablet by mouth daily.   Yes Historical Provider, MD  omeprazole (PRILOSEC) 40 MG capsule Take 1 capsule (40 mg total) by mouth every morning. 09/12/15  Yes Chauncey Cruel, MD  Respiratory Therapy Supplies (FLUTTER) DEVI Use as directed 10/17/15  Yes Tanda Rockers, MD  REXULTI 0.5 MG TABS Take 0.5 mg by mouth at bedtime.  11/05/14  Yes Historical Provider, MD  VIIBRYD 40 MG TABS Take 40 mg by mouth daily.  11/13/14  Yes Historical Provider, MD  methylPREDNISolone  (MEDROL) 4 MG tablet Take 1 tablet (4 mg total) by mouth 2 (two) times daily. Patient not taking: Reported on 01/30/2016 11/23/15   [name], MD   Social History   Social History  . Marital Status: Married    Spouse Name: N/A  . Number of Children: 1  . Years of Education: N/A   Occupational History  . homemaker    Social History Main Topics  . Smoking status: Never Smoker   . Smokeless tobacco: Never Used  . Alcohol Use: No     Comment: 07/28/2013 "no alcohol since 1994; never had problem w/it"  . Drug Use: No  . Sexual Activity: No     Comment: TAH/BSO   Other Topics Concern  . Not on file   Social History Narrative    Review of Systems  Constitutional: Negative for fever, chills and appetite change.  Respiratory: Positive for cough (from allergies to cats).   Gastrointestinal: Positive for abdominal pain. Negative for nausea, vomiting, diarrhea and constipation.  Genitourinary: Positive for dysuria and flank pain. Negative for hematuria, vaginal bleeding, vaginal discharge and vaginal pain.  Musculoskeletal: Positive for back pain (no changes from baseline).    Objective:  BP 122/80 mmHg  Pulse 111  Temp(Src) 98.2 F (36.8 C) (Oral)  Resp 18  Ht 5' 3.5" (1.613 m)  Wt 203 lb (92.08 kg)  BMI 35.39 kg/m2  SpO2 96%  LMP 08/26/1995  Physical Exam  Constitutional: She appears well-developed and well-nourished. No distress.  HENT:  Head: Normocephalic and atraumatic.  Eyes: Conjunctivae are normal.  Neck: Neck supple.  No CVA tenderness  Cardiovascular: Regular rhythm.  Tachycardia present.   Pulmonary/Chest: Effort normal and breath sounds normal. No respiratory distress. She has no wheezes. She has no rales.  Abdominal:  Hypoactive bowel sounds  Neurological: She is alert.  Skin: Skin is warm and dry.  Psychiatric: She has a normal mood and affect. Her behavior is normal.  Nursing note and vitals reviewed.  Results for orders placed or performed in  visit on 01/30/16  POCT Microscopic Urinalysis ([facility])  Result Value Ref Range   WBC,UR,HPF,POC Many (A)  None WBC/hpf   RBC,UR,HPF,POC None None RBC/hpf   Bacteria Few (A) None, Too numerous to count   Mucus Absent Absent   Epithelial Cells, UR Per Microscopy None None, Too numerous to count cells/hpf  POCT urinalysis dipstick  Result Value Ref Range   Color, UA yellow yellow   Clarity, UA clear clear   Glucose, UA negative negative   Bilirubin, UA negative negative   Ketones, POC UA negative negative   Spec Grav, UA 1.010    Blood, UA small (A) negative   pH, UA 6.5    Protein Ur, POC negative negative   Urobilinogen, UA 0.2    Nitrite, UA Negative Negative   Leukocytes, UA large (3+) (A) Negative   Assessment & Plan:   1. Urinary frequency   2. Acute cystitis without hematuria     Orders Placed This Encounter  Procedures  . Urine culture    Order Specific Question:  Source    Answer:  bladder  . POCT Microscopic Urinalysis ([facility])  . POCT urinalysis dipstick    Meds ordered this encounter  Medications  . cephALEXin (KEFLEX) 500 MG capsule    Sig: Take 1 capsule (500 mg total) by mouth 2 (two) times daily.    Dispense:  14 capsule    Refill:  0    I personally performed the services described in this documentation, which was scribed in my presence. The recorded information has been reviewed and considered, and addended by me as needed.   Delman Cheadle, M.D.  [facility] [address] [place], [place] [address] [phone] phone [phone] fax  02/08/2016 11:29 PM

## 2016-02-01 ENCOUNTER — Other Ambulatory Visit: Payer: Self-pay | Admitting: Neurosurgery

## 2016-02-01 DIAGNOSIS — Z780 Asymptomatic menopausal state: Secondary | ICD-10-CM | POA: Diagnosis not present

## 2016-02-01 DIAGNOSIS — Z1231 Encounter for screening mammogram for malignant neoplasm of breast: Secondary | ICD-10-CM | POA: Diagnosis not present

## 2016-02-01 DIAGNOSIS — M5136 Other intervertebral disc degeneration, lumbar region: Secondary | ICD-10-CM

## 2016-02-12 DIAGNOSIS — C50911 Malignant neoplasm of unspecified site of right female breast: Secondary | ICD-10-CM | POA: Diagnosis not present

## 2016-02-13 ENCOUNTER — Ambulatory Visit
Admission: RE | Admit: 2016-02-13 | Discharge: 2016-02-13 | Disposition: A | Discharge status: Home / Self Care | Payer: Medicare Other | Source: Ambulatory Visit | Attending: Neurosurgery | Admitting: Neurosurgery

## 2016-02-13 DIAGNOSIS — M5136 Other intervertebral disc degeneration, lumbar region: Secondary | ICD-10-CM

## 2016-02-13 DIAGNOSIS — M51369 Other intervertebral disc degeneration, lumbar region without mention of lumbar back pain or lower extremity pain: Secondary | ICD-10-CM

## 2016-02-13 DIAGNOSIS — M4806 Spinal stenosis, lumbar region: Secondary | ICD-10-CM | POA: Diagnosis not present

## 2016-02-13 MED ORDER — GADOBENATE DIMEGLUMINE 529 MG/ML IV SOLN
10.0000 mL | Freq: Once | INTRAVENOUS | Status: AC | PRN
  Administered 2016-02-13: 10 mL via INTRAVENOUS

## 2016-02-16 ENCOUNTER — Other Ambulatory Visit: Payer: Self-pay | Admitting: Family Medicine

## 2016-02-18 ENCOUNTER — Encounter: Payer: Self-pay | Admitting: Emergency Medicine

## 2016-02-18 NOTE — Telephone Encounter (Signed)
Called patient in reference to the Keflex 500mg  refill request placed on 02/16/2016. Patient was seen by Dr. Brigitte Pulse on 01/30/2016 for a UTI. 2 week course should have ended on 02/13/2016.

## 2016-02-19 DIAGNOSIS — Z6835 Body mass index (BMI) 35.0-35.9, adult: Secondary | ICD-10-CM | POA: Diagnosis not present

## 2016-02-19 DIAGNOSIS — M5136 Other intervertebral disc degeneration, lumbar region: Secondary | ICD-10-CM | POA: Diagnosis not present

## 2016-02-20 ENCOUNTER — Telehealth: Payer: Self-pay

## 2016-02-20 NOTE — Telephone Encounter (Signed)
Fax received from [facility]; for Cephalexin refill - faxed back denial - pt must be seen to refill abx

## 2016-02-21 DIAGNOSIS — E2839 Other primary ovarian failure: Secondary | ICD-10-CM

## 2016-02-28 DIAGNOSIS — I10 Essential (primary) hypertension: Secondary | ICD-10-CM | POA: Diagnosis not present

## 2016-03-04 ENCOUNTER — Encounter (HOSPITAL_COMMUNITY): Payer: Self-pay

## 2016-03-04 DIAGNOSIS — Z6834 Body mass index (BMI) 34.0-34.9, adult: Secondary | ICD-10-CM | POA: Diagnosis not present

## 2016-03-04 DIAGNOSIS — M4806 Spinal stenosis, lumbar region: Secondary | ICD-10-CM | POA: Diagnosis not present

## 2016-03-07 ENCOUNTER — Other Ambulatory Visit: Payer: Self-pay

## 2016-03-07 DIAGNOSIS — C50511 Malignant neoplasm of lower-outer quadrant of right female breast: Secondary | ICD-10-CM

## 2016-03-07 DIAGNOSIS — C9110 Chronic lymphocytic leukemia of B-cell type not having achieved remission: Secondary | ICD-10-CM

## 2016-03-09 NOTE — Progress Notes (Signed)
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 Annabell [name] performed a right lumpectomy and axillary lymph node dissection for what according to the patient and her family was a stage I invasive ductal breast cancer. She received radiation adjuvantly and then took tamoxifen for 5 years  In February of 2014 she had a normal screening mammography, but in November 2014 she felt a "hard lump" in her right breast. She brought this to the attention of her urologist, Dr. Izora [name], and he set her up for bilateral diagnostic mammography with mammography at [facility] 07/13/2013. This showed her breast density to be category B. There was no mammographic abnormality noted but ultrasonography showed a 1.4 cm irregularly shaped solid mass in the right breast at the 8:00 position. This was biopsied the same day, and showed ([id number]) an invasive ductal carcinoma, grade 2, estrogen receptor 100% positive, progesterone receptor 13% positive, with an MIB-1 of 33% and HER-2 amplification with a HER-2: CEP 17 ratio of 3.19, and a HER-2 copy number percent all of 4.15.  The patient's subsequent history is as detailed below  INTERVAL HISTORY: [name] [name] returns today for follow up of her recurrent estrogen receptor positive right breast cancer, accompanied by her husband [name] [name]. She is now a little over 2 years of her planned 5 years of anastrozole. Generally she tolerates that well. She does have hot flashes, but vaginal dryness is not a major concern. She obtains a drug at a good price.   She also has a  diagnosis of CLL. She is very anxious about this but at this point her chronic lymphoid leukemia requires no intervention other than follow-up  REVIEW OF SYSTEMS: [name] [name] feels moderately fatigued. She sleeps poorly. She has pain in her back and neck but primarily her back. She describes this as stabbing  and cramping. She takes occasional Aleve for this. It is intermittent. She has blurred vision at times. She has a hearing aid in place. She has sinus symptoms, a dry cough, and feels short of breath particularly when walking upstairs. She has heartburn problems. She has loose stools at times. She feels anxious depressed and forgetful. She denies fevers, rash, drenching sweats, unexplained weight loss or unexplained fatigue. A detailed review of systems today was otherwise noncontributory.   PAST MEDICAL HISTORY: Past Medical History  Diagnosis Date  . Sinus tachycardia (HCC)     mild resting  . HLD (hyperlipidemia)   . Orthostatic hypotension     slight in the past  . Anxiety   . Depression   . Diverticulosis   . Fibromyalgia 1978  . Hypothyroidism   . Alopecia   . Ruptured disk     one in neck and two in back  . Plantar fasciitis   . GERD (gastroesophageal reflux disease)   . History of syncope     episode 2008--  no recurrence since  . Ureteral calculi     bilateral  . Leukocytosis   . Chronic back pain     "lower back and upper neck" (07/28/2013)  . 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 ear   .  Urgency of urination   . Hematuria   . Sensation of pressure in bladder area   . Wears glasses   . Ecchymosis     FROM IV AND LAB WORK THE PAST WEEK  06-17-2013  . Stool incontinence     "at times recently"   . Kidney stones 2014  . CAP (community acquired pneumonia)     admission 06-04-2013 secondary to failed oral antibitotics---  last cxr 06-16-2013  much improved  . CLL (chronic lymphocytic leukemia) ([place]) dx'd 05/2013    "I don't think I have it though" (07/28/2013)  . Breast cancer ([place]) 1997;  2014    right  . Ejection fraction   . SOB (shortness of breath) 06-17-2013 CURRENTLY RESIDUAL FROM RECENT CAP    new since sore throats- 12/2014    . Arthritis     "spine" (07/28/2013)  . DDD (degenerative disc disease)   . Deafness in left ear   . S/P chemotherapy, time since greater than 12 weeks     PAST SURGICAL HISTORY: Past Surgical History  Procedure Laterality Date  . Lumbar epidural injection      has had 7 injections  . Total abdominal hysterectomy w/ bilateral salpingoophorectomy  1997  . Breast lumpectomy with axillary lymph node dissection Right 1997  . Transthoracic echocardiogram  05-11-2007  dr [name] [name]    normal lvf/  ef 65%  . Tonsillectomy  AGE 23  . Retinal detachment surgery Right 2013  . Cystoscopy with retrograde pyelogram, ureteroscopy and stent placement Bilateral 06/17/2013    Procedure: CYSTOSCOPY WITH RETROGRADE PYELOGRAM, URETEROSCOPY AND LEFT DOUBLE  J STENT PLACEMENT RIGHT URETERAL HOLMIIUM LASER AND DOUBLE J STENT ;  Surgeon: [name] Ben, MD;  Location: [facility];  Service: Urology;  Laterality: Bilateral;  . Holmium laser application Bilateral 70/26/3785    Procedure: HOLMIUM LASER APPLICATION;  Surgeon: [name] Ben, MD;  Location: [facility];  Service: Urology;  Laterality: Bilateral;  . Mastectomy w/ nodes partial Right 1997  . Mastectomy complete / simple Right 07/28/2013  . Appendectomy  1997  . Breast biopsy Right 2014  . Cystoscopy with ureteroscopy, stone basketry and stent placement Bilateral 2014  . Lithotripsy Left ~ 06/2013  . Simple mastectomy with axillary sentinel node biopsy Right 07/28/2013    Procedure: RIGHT TOTAL  MASTECTOMY;  Surgeon: [name] Burn. Georgette Dover, MD;  Location: [facility];  Service: General;  Laterality: Right;  . Portacath placement Left 07/28/2013    Procedure: ATTEMPTED INSERTION PORT-A-CATH;  Surgeon: [name] Burn. Georgette Dover, MD;  Location: [facility];  Service: General;  Laterality: Left;  .  Port-a-cath removal Left 10/03/2014    Procedure: REMOVAL PORT-A-CATH;  Surgeon: [name] Mesa, MD;  Location: [facility];  Service: General;  Laterality: Left;  . Abdominal hysterectomy    . Robotic assited partial nephrectomy Right 02/05/2015    Procedure: ROBOTIC ASSITED PARTIAL NEPHRECTOMY;  Surgeon: Raynelle Bring, MD;  Location: [facility];  Service: Urology;  Laterality: Right;    FAMILY HISTORY Family History  Problem Relation Age of Onset  . Heart disease Maternal Grandfather   . Diabetes Maternal Grandfather   . Colon cancer Maternal Aunt 79  . Brain cancer Paternal Grandmother     dx in 40s  . Dementia Mother   . Diabetes Mother   . Osteoporosis Mother   . Diabetes Maternal Aunt   . Prostate cancer Father 44  . Bipolar disorder Maternal Aunt   . Stroke Maternal Aunt   .  Stomach cancer Paternal Uncle     dx in late 69s   the patient's mother died at the age of 54. The patient's father is alive at age 60. She had no brothers or sisters. Her father has a history of prostate cancer. There is no history of breast or ovarian cancer in the family. One maternal first cousin has a history of Hodgkin's lymphoma.  GYNECOLOGIC HISTORY:  Menarche age 62, first live birth age 69. She is GX P1. She underwent total abdominal hysterectomy and bilateral salpingo-oophorectomy in 1997 she did not take hormone replacement.  SOCIAL HISTORY: (Updated  11/15/2013)  She is a homemaker. Her husband Loistine Chance farms approximately 500 acres, including quite a bit of tobacco. Daughter Judeth Cornfield lives in [place] where she works as a Brewing technologist for a drug company. The patient has no grandchildren. She has two "grand cats". She attends a local [employer]    ADVANCED DIRECTIVES: Not in place   HEALTH MAINTENANCE:  (Updated   11/15/2013 ) Social History  Substance Use Topics  . Smoking status: Never Smoker   . Smokeless tobacco: Never Used  . Alcohol  Use: No     Comment: 07/28/2013 "no alcohol since 1994; never had problem w/it"     Colonoscopy: 2009/ [facility]  PAP: Status post hysterectomy  Bone density: May 10/14/2009 at [facility] was normal  Lipid panel:  Not on file   Allergies  Allergen Reactions  . Lasix [Furosemide] Nausea And Vomiting and Other (See Comments)    headache  . Lithium Other (See Comments)    Dizziness, "cause me to fall"  . Sulfa Antibiotics Hives  . Trazodone And Nefazodone Other (See Comments)    insomnia  . Lyrica [Pregabalin] Diarrhea, Nausea Only and Rash    Current Outpatient Prescriptions  Medication Sig Dispense Refill  . ALPRAZolam (XANAX) 0.5 MG tablet TAKE 1 TABLET EVERY 8 HOURS AS NEEDED (Patient taking differently: TAKE 1 TABLET BY MOUTH EVERY TWICE DAILY.) 30 tablet 0  . amitriptyline (ELAVIL) 150 MG tablet Take 150 mg by mouth at bedtime.    Marland Kitchen anastrozole (ARIMIDEX) 1 MG tablet TAKE 1 TABLET (1 MG TOTAL) BY MOUTH DAILY. 90 tablet 1  . anastrozole (ARIMIDEX) 1 MG tablet TAKE 1 TABLET BY MOUTH EVERY DAY 90 tablet 1  . anastrozole (ARIMIDEX) 1 MG tablet TAKE 1 TABLET BY MOUTH EVERY DAY 90 tablet 1  . aspirin 81 MG tablet Take 81 mg by mouth daily.    . cephALEXin (KEFLEX) 500 MG capsule Take 1 capsule (500 mg total) by mouth 2 (two) times daily. 14 capsule 0  . Cholecalciferol (VITAMIN D-3 PO) Take 1 capsule by mouth daily. Reported on 09/25/2015    . gabapentin (NEURONTIN) 300 MG capsule TK 1 C PO QHS  4  . levothyroxine (SYNTHROID, LEVOTHROID) 50 MCG tablet Take 1 tablet (50 mcg total) by mouth daily before breakfast. 90 tablet 4  . minoxidil (ROGAINE) 2 % external solution Apply 1 application topically 2 (two) times daily as needed (hair.).     Marland Kitchen Multiple Vitamins-Minerals (HAIR/SKIN/NAILS PO) Take 1 tablet by mouth daily.    Marland Kitchen omeprazole (PRILOSEC) 40 MG capsule Take 1 capsule (40 mg total) by mouth every morning. 90 capsule 1  . Respiratory Therapy Supplies (FLUTTER) DEVI Use as directed 1 each 0   . REXULTI 0.5 MG TABS Take 0.5 mg by mouth at bedtime.   0  . VIIBRYD 40 MG TABS Take 40 mg by mouth daily.  1   No current facility-administered medications for this visit.    OBJECTIVE: 70 year old white woman In no acute distress Filed Vitals:   03/10/16 1301  BP: 135/60  Pulse: 97  Temp: 98.6 F (37 C)  Resp: 17     Body mass index is 34.78 kg/(m^2).    ECOG FS:2 - Symptomatic, <50% confined to bed Filed Weights   03/10/16 1301  Weight: 199 lb 8 oz (90.493 kg)   Sclerae unicteric, EOMs intact Oropharynx clear and moist No cervical or supraclavicular adenopathy Lungs no rales or rhonchi Heart regular rate and rhythm Abd soft, nontender, positive bowel sounds MSK no focal spinal tenderness, no upper extremity lymphedema Neuro: nonfocal, well oriented, appropriate affect Breasts: Deferred   LAB RESULTS: Results for ILENE, WITCHER (MRN [id number]) as of 03/10/2016 14:52  Ref. Range 09/21/2014 11:26 12/21/2014 14:29 04/26/2015 14:53 11/09/2015 13:27 03/10/2016 12:22  lymph# Latest Ref Range: 0.9-3.3 10e3/uL 8.3 (H) 12.3 (H) 12.0 (H) 28.6 (H) 21.5 (H)    Lab Results  Component Value Date   WBC 27.3* 03/10/2016   NEUTROABS 4.4 03/10/2016   HGB 11.4* 03/10/2016   HCT 35.8 03/10/2016   MCV 81.8 03/10/2016   PLT 209 03/10/2016      Chemistry      Component Value Date/Time   NA 142 03/10/2016 1222   NA 142 08/02/2015 1529   K 4.0 03/10/2016 1222   K 4.3 08/02/2015 1529   CL 111 08/02/2015 1529   CO2 20* 03/10/2016 1222   CO2 22 08/02/2015 1529   BUN 16.1 03/10/2016 1222   BUN 11 08/02/2015 1529   CREATININE 1.2* 03/10/2016 1222   CREATININE 1.00 08/02/2015 1529   CREATININE 1.15* 12/07/2014 1538      Component Value Date/Time   CALCIUM 9.6 03/10/2016 1222   CALCIUM 9.4 08/02/2015 1529   ALKPHOS 82 03/10/2016 1222   ALKPHOS 75 12/07/2014 1538   AST 50* 03/10/2016 1222   AST 32 12/07/2014 1538   ALT 55 03/10/2016 1222   ALT 38* 12/07/2014 1538   BILITOT  <0.30 03/10/2016 1222   BILITOT 0.3 12/07/2014 1538      STUDIES: Dg Lumbar Spine Complete  02/13/2016  CLINICAL DATA:  Chronic low back pain, history of breast carcinoma, numbness in the feet EXAM: LUMBAR SPINE - COMPLETE 4+ VIEW COMPARISON:  CT abdomen pelvis of 12/11/2014 FINDINGS: There is 9 mm anterolisthesis of L5 on S1. No definite pars defects are seen although oblique views were not obtained. This anterolisthesis most likely is due to degenerative change at the L5-S1 level. There is diffuse degenerative disc disease throughout the lumbar spine, most marked at L4-5 where there is significant loss of intervertebral disc space and sclerosis with spurring. No compression deformity is seen. The SI joints appear corticated. In flexion the anterolisthesis of L5 on S1 measures 8 mm, and in extension this anterolisthesis measures 12 mm. IMPRESSION: 1. 9 mm anterolisthesis of L5 on S1 most likely due to degenerative change involving the facet joints. 2. This anterolisthesis measures 8 mm in flexion and 12 mm in extension. 3. Diffuse degenerative disc disease, most marked at L5-S1. Electronically Signed   By: Ivar Drape DanielleD.   On: 02/13/2016 16:55   Mr Lumbar Spine W Wo Contrast  02/13/2016  CLINICAL DATA:  Severe low back pain radiating to both hips. BILATERAL feet numbness. EXAM: MRI LUMBAR SPINE WITHOUT AND WITH CONTRAST TECHNIQUE: Multiplanar and multiecho pulse sequences of the lumbar spine were obtained without and with  intravenous contrast. CONTRAST:  62m MULTIHANCE GADOBENATE DIMEGLUMINE 529 MG/ML IV SOLN [facility] [address] Results: Creatinine 1.2 mg/dL. COMPARISON:  Lumbar spine radiographs 02/13/2016. FINDINGS: Segmentation: Normal. Alignment:  4 mm anterolisthesis L5-S1. Vertebrae: No worrisome osseous lesion.Endplate edema affects the the RIGHT greater than LEFT L1 and L2 vertebral bodies related to L1-2 disc disease. I do not see  endplate destruction to suggest discitis. Conus medullaris: Normal in size, signal, and location. Paraspinal tissues: No evidence for hydronephrosis or paravertebral mass. Disc levels: L1-L2: Central and rightward disc extrusion, extending to the foramen and beyond. RIGHT L1 and L2 nerve root impingement. Facet arthropathy contributes to mild stenosis. L2-L3: Severe disc space narrowing. Central and leftward protrusion with endplate reactive changes. Mild stenosis. LEFT greater than RIGHT L2 and L3 nerve root impingement. L3-L4: Severe disc space narrowing. Annular bulging with osseous ridging. Asymmetric facet arthropathy on LEFT. LEFT greater than RIGHT L4 and L3 nerve root impingement. L4-L5: Severe disc space narrowing. Central protrusion. Osseous ridging. Facet arthropathy. RIGHT greater than LEFT L4 and L5 nerve root impingement. L5-S1: Annular bulging. Posterior element hypertrophy. 2 mm anterolisthesis. No subarticular zone or foraminal zone narrowing. Post infusion imaging demonstrates no worrisome vertebral body lesions or epidural enhancement. Compared with recent plain films, there is significant dynamic instability at L5-S1 with patient standing. On lateral extension radiograph this measures up to 12 mm. This anterolisthesis reduces significantly with patient recumbent for MR. IMPRESSION: Multilevel spondylosis as described. Potentially symptomatic neural impingement at multiple levels, most notably at L1-2 on the RIGHT. Dynamic instability at L5-S1, but no static neural impingement related to 4 mm anterolisthesis and annular bulging. Electronically Signed   By: JStaci RighterM.D.   On: 02/13/2016 18:06     ASSESSMENT: 70y.o. BRCA negative [place] woman  (1) status post right breast lumpectomy and axillary lymph node dissection in 1997 for a stage I breast cancer, treated with adjuvant radiation and tamoxifen for 5 years  (2) status post right breast lower outer quadrant biopsy 07/13/2013  for a clinical T1c N0, stage IA invasive ductal carcinoma, grade 2, estrogen receptor 100% positive, progesterone receptor 13% positive, with an MIB-1 of 33%, and HER-2 amplification by CISH with a HER2/CEP 17 ratio of 3.19, and an average HER-2 copy number per cell of 4.15  (3) status post right mastectomy 07/28/2013 for a pT1c pN0, stage IA invasive ductal carcinoma, grade 3, with close but negative margins. Prognostic panel was not repeated  (4) completed weekly paclitaxel x12 12/06/2913, with trastuzumab/ pertuzumab every 3 weeks; pertuzumab was held with third dose on 11/01/2013 due to diarrhea, tried a half dose on cycle 4 again with diarrhea developing  (5) trastuzumab (started 09/20/2013) continued for 1 year, last dose 09/21/2014;  (a) final echocardiogram 09/07/2014 showed an ejection fraction of 55-60%  (6) anastrozole started May 2015;   (a) bone density 12/15/2013 normal  (b) [facility] -1.0   OTHER PROBLEMS:  (a) History of chronic lymphoid leukemia diagnosed by flow cytometry 06/29/2013, the cells being CD5, CD20 and CD23 positive, CD10 negative.   (1) right renal mass resected on 02/05/15, consisting of atypical lymphoid proliferation composed of monotonous small lymphocytes   (b) Anemia with a normal MCV and normal ferritin-- B-12 and folate normal  (c) the patient met with Dr. BHarlow Maresand has decided against reconstruction  PLAN: JCapriais now 2-1/2 years out from  definitive surgery for her breast cancer with no evidence of disease recurrence. This is favorable.   She is tolerating the anastrozole well and the plan continues to be for 5 years on that medication.  She will be due for repeat bone density June 2019  As far as her chronic lymphoid leukemia is concerned she understands her lymphocyte count is can it be going up and down but in general up, and unless there is significant thrombocytopenia or anemia due to that,  or there are systemic symptoms, we would not initiate treatment.  As far as the breast cancer is concerned I'm going to be seeing her on a once a year basis. As far as his CLL is concerned I would feel more comfortable checking her counts twice a year that she will return in 6 months for labs and in 12 months for labs and physical exam.  She is very agreeable to this plan. The big problem in front of her of course is the possibility of back surgery and she may have had that by the time she returns to see me.  Chauncey Cruel, MD  03/10/2016 2:50 PM

## 2016-03-10 ENCOUNTER — Telehealth: Payer: Self-pay | Admitting: Oncology

## 2016-03-10 ENCOUNTER — Other Ambulatory Visit (HOSPITAL_BASED_OUTPATIENT_CLINIC_OR_DEPARTMENT_OTHER): Payer: Medicare Other

## 2016-03-10 ENCOUNTER — Ambulatory Visit (HOSPITAL_BASED_OUTPATIENT_CLINIC_OR_DEPARTMENT_OTHER): Payer: Medicare Other | Admitting: Oncology

## 2016-03-10 VITALS — BP 135/60 | HR 97 | Temp 98.6°F | Resp 17 | Ht 63.5 in | Wt 199.5 lb

## 2016-03-10 DIAGNOSIS — Z79811 Long term (current) use of aromatase inhibitors: Secondary | ICD-10-CM

## 2016-03-10 DIAGNOSIS — C50511 Malignant neoplasm of lower-outer quadrant of right female breast: Secondary | ICD-10-CM

## 2016-03-10 DIAGNOSIS — C9110 Chronic lymphocytic leukemia of B-cell type not having achieved remission: Secondary | ICD-10-CM | POA: Diagnosis not present

## 2016-03-10 DIAGNOSIS — Z170 Estrogen receptor positive status [ER+]: Secondary | ICD-10-CM

## 2016-03-10 LAB — COMPREHENSIVE METABOLIC PANEL
ALT: 55 U/L (ref 0–55)
ANION GAP: 11 meq/L (ref 3–11)
AST: 50 U/L — AB (ref 5–34)
Albumin: 4.2 g/dL (ref 3.5–5.0)
Alkaline Phosphatase: 82 U/L (ref 40–150)
BUN: 16.1 mg/dL (ref 7.0–26.0)
CHLORIDE: 111 meq/L — AB (ref 98–109)
CO2: 20 meq/L — AB (ref 22–29)
CREATININE: 1.2 mg/dL — AB (ref 0.6–1.1)
Calcium: 9.6 mg/dL (ref 8.4–10.4)
EGFR: 45 mL/min/{1.73_m2} — ABNORMAL LOW (ref >90)
Glucose: 117 mg/dl (ref 70–140)
POTASSIUM: 4 meq/L (ref 3.5–5.1)
Sodium: 142 mEq/L (ref 136–145)
Total Bilirubin: <0.3 mg/dL (ref 0.20–1.20)
Total Protein: 6.9 g/dL (ref 6.4–8.3)

## 2016-03-10 LAB — LACTATE DEHYDROGENASE: LDH: 342 U/L — AB (ref 125–245)

## 2016-03-10 LAB — CBC WITH DIFFERENTIAL/PLATELET
BASO%: 0.6 % (ref 0.0–2.0)
BASOS ABS: 0.2 10*3/uL — AB (ref 0.0–0.1)
EOS%: 2 % (ref 0.0–7.0)
Eosinophils Absolute: 0.5 10*3/uL (ref 0.0–0.5)
HCT: 35.8 % (ref 34.8–46.6)
HGB: 11.4 g/dL — ABNORMAL LOW (ref 11.6–15.9)
LYMPH%: 78.9 % — AB (ref 14.0–49.7)
MCH: 26 pg (ref 25.1–34.0)
MCHC: 31.8 g/dL (ref 31.5–36.0)
MCV: 81.8 fL (ref 79.5–101.0)
MONO#: 0.6 10*3/uL (ref 0.1–0.9)
MONO%: 2.4 % (ref 0.0–14.0)
NEUT#: 4.4 10*3/uL (ref 1.5–6.5)
NEUT%: 16.1 % — AB (ref 38.4–76.8)
PLATELETS: 209 10*3/uL (ref 145–400)
RBC: 4.38 10*6/uL (ref 3.70–5.45)
RDW: 15.9 % — ABNORMAL HIGH (ref 11.2–14.5)
WBC: 27.3 10*3/uL — ABNORMAL HIGH (ref 3.9–10.3)
lymph#: 21.5 10*3/uL — ABNORMAL HIGH (ref 0.9–3.3)

## 2016-03-10 NOTE — Telephone Encounter (Signed)
appt made and avs printed °

## 2016-03-11 LAB — BETA 2 MICROGLOBULIN, SERUM: BETA 2: 3.2 mg/L — AB (ref 0.6–2.4)

## 2016-03-25 ENCOUNTER — Other Ambulatory Visit: Payer: Self-pay | Admitting: Neurosurgery

## 2016-04-16 ENCOUNTER — Encounter (HOSPITAL_COMMUNITY): Payer: Self-pay

## 2016-04-16 NOTE — Pre-Procedure Instructions (Signed)
    [name] [name]  04/16/2016      [facility] - [place], [place] - [address] AT [place] [place] [address] [place] [address] Phone: [phone] Fax: [phone]  [id number] [place], [place] [address] Phone: [phone] Fax: [phone]  [facility] [place], [place] - [address] AT [place] [address] [address] Phone: [phone] Fax: [phone]    Your procedure is scheduled on 04/25/16.  Report to [facility] Admitting at 6 A.M.  Call this number if you have problems the morning of surgery:  [phone]   Remember:  Do not eat food or drink liquids after midnight.  Take these medicines the morning of surgery with A SIP OF WATER --xanax,arimidex,neurontin,synthroid,prilosec   Do not wear jewelry, make-up or nail polish.  Do not wear lotions, powders, or perfumes, or deoderant.  Do not shave 48 hours prior to surgery.  Men may shave face and neck.  Do not bring valuables to the hospital.  [facility] is not responsible for any belongings or valuables.  Contacts, dentures or bridgework may not be worn into surgery.  Leave your suitcase in the car.  After surgery it may be brought to your room.  For patients admitted to the hospital, discharge time will be determined by your treatment team.  Patients discharged the day of surgery will not be allowed to drive home.   Name and phone number of your driver:   Special instructions:  Do not take any aspirin,anti-inflammatories,vitamins,or herbal supplements 5-7 days prior to surgery.  Please read over the following fact sheets that you were given.

## 2016-04-16 NOTE — Pre-Procedure Instructions (Signed)
    [name] [name]  04/16/2016      [facility] - [place], [place] - [address] AT [place] [place] [address] Phone: [phone] Fax: [phone]  [id number] [place], [place] [address] Phone: [phone] Fax: [phone]  [facility] [place], [place] - [place] AT [address] [place] [address] Phone: [phone] Fax: [phone]    Your procedure is scheduled on 04/25/16.  Report to [facility] Admitting at 6 A.M.  Call this number if you have problems the morning of surgery:  [phone]   Remember:  Do not eat food or drink liquids after midnight.  Take these medicines the morning of surgery with A SIP OF WATER --xanax,neurontin,synthroid,prilosec,arimidex   Do not wear jewelry, make-up or nail polish.  Do not wear lotions, powders, or perfumes, or deoderant.  Do not shave 48 hours prior to surgery.  Men may shave face and neck.  Do not bring valuables to the hospital.  [facility] is not responsible for any belongings or valuables.  Contacts, dentures or bridgework may not be worn into surgery.  Leave your suitcase in the car.  After surgery it may be brought to your room.  For patients admitted to the hospital, discharge time will be determined by your treatment team.  Patients discharged the day of surgery will not be allowed to drive home.   Name and phone number of your driver:   Special instructions:  Do not take any aspirin,anti-inflammatories,vitamins,or herbal supplements 5-7 days prior to surgery.  Please read over the following fact sheets that you were given. MRSA Information

## 2016-04-17 ENCOUNTER — Encounter (HOSPITAL_COMMUNITY): Payer: Self-pay

## 2016-04-17 ENCOUNTER — Encounter (HOSPITAL_COMMUNITY)
Admission: RE | Admit: 2016-04-17 | Discharge: 2016-04-17 | Disposition: A | Discharge status: Home / Self Care | Payer: Medicare Other | Source: Ambulatory Visit | Attending: Neurosurgery | Admitting: Neurosurgery

## 2016-04-17 DIAGNOSIS — Z0183 Encounter for blood typing: Secondary | ICD-10-CM | POA: Insufficient documentation

## 2016-04-17 DIAGNOSIS — M4315 Spondylolisthesis, thoracolumbar region: Secondary | ICD-10-CM | POA: Diagnosis not present

## 2016-04-17 DIAGNOSIS — C50911 Malignant neoplasm of unspecified site of right female breast: Secondary | ICD-10-CM | POA: Diagnosis not present

## 2016-04-17 DIAGNOSIS — Z01812 Encounter for preprocedural laboratory examination: Secondary | ICD-10-CM | POA: Insufficient documentation

## 2016-04-17 DIAGNOSIS — D72820 Lymphocytosis (symptomatic): Secondary | ICD-10-CM | POA: Diagnosis not present

## 2016-04-17 LAB — CBC WITH DIFFERENTIAL/PLATELET
BAND NEUTROPHILS: 1 %
BASOS ABS: 0 10*3/uL (ref 0.0–0.1)
BASOS PCT: 0 %
Blasts: 0 %
EOS ABS: 0.3 10*3/uL (ref 0.0–0.7)
Eosinophils Relative: 1 %
HEMATOCRIT: 36.2 % (ref 36.0–46.0)
HEMOGLOBIN: 11.2 g/dL — AB (ref 12.0–15.0)
Lymphocytes Relative: 86 %
Lymphs Abs: 26.9 10*3/uL — ABNORMAL HIGH (ref 0.7–4.0)
MCH: 26.4 pg (ref 26.0–34.0)
MCHC: 30.9 g/dL (ref 30.0–36.0)
MCV: 85.2 fL (ref 78.0–100.0)
METAMYELOCYTES PCT: 0 %
MONO ABS: 1.3 10*3/uL — AB (ref 0.1–1.0)
MYELOCYTES: 0 %
Monocytes Relative: 4 %
NEUTROS PCT: 8 %
NRBC: 0 /100{WBCs}
Neutro Abs: 2.8 10*3/uL (ref 1.7–7.7)
Other: 0 %
PROMYELOCYTES ABS: 0 %
Platelets: 289 10*3/uL (ref 150–400)
RBC: 4.25 MIL/uL (ref 3.87–5.11)
RDW: 15.8 % — ABNORMAL HIGH (ref 11.5–15.5)
WBC: 31.3 10*3/uL — ABNORMAL HIGH (ref 4.0–10.5)

## 2016-04-17 LAB — ABO/RH: ABO/RH(D): O POS

## 2016-04-17 LAB — BASIC METABOLIC PANEL
Anion gap: 8 (ref 5–15)
BUN: 17 mg/dL (ref 6–20)
CO2: 24 mmol/L (ref 22–32)
Calcium: 9.9 mg/dL (ref 8.9–10.3)
Chloride: 107 mmol/L (ref 101–111)
Creatinine, Ser: 1.15 mg/dL — ABNORMAL HIGH (ref 0.44–1.00)
GFR calc Af Amer: 55 mL/min — ABNORMAL LOW (ref >60)
GFR, EST NON AFRICAN AMERICAN: 47 mL/min — AB (ref >60)
Glucose, Bld: 94 mg/dL (ref 65–99)
POTASSIUM: 4.9 mmol/L (ref 3.5–5.1)
SODIUM: 139 mmol/L (ref 135–145)

## 2016-04-17 LAB — SURGICAL PCR SCREEN
MRSA, PCR: NEGATIVE
Staphylococcus aureus: NEGATIVE

## 2016-04-17 LAB — PATHOLOGIST SMEAR REVIEW

## 2016-04-17 MED ORDER — CHLORHEXIDINE GLUCONATE CLOTH 2 % EX PADS: 6.0000 | MEDICATED_PAD | Freq: Once | CUTANEOUS | Status: DC

## 2016-04-23 DIAGNOSIS — H35371 Puckering of macula, right eye: Secondary | ICD-10-CM | POA: Diagnosis not present

## 2016-04-23 DIAGNOSIS — H25013 Cortical age-related cataract, bilateral: Secondary | ICD-10-CM | POA: Diagnosis not present

## 2016-04-23 DIAGNOSIS — H35362 Drusen (degenerative) of macula, left eye: Secondary | ICD-10-CM | POA: Diagnosis not present

## 2016-04-23 DIAGNOSIS — H35033 Hypertensive retinopathy, bilateral: Secondary | ICD-10-CM | POA: Diagnosis not present

## 2016-04-23 DIAGNOSIS — H2513 Age-related nuclear cataract, bilateral: Secondary | ICD-10-CM | POA: Diagnosis not present

## 2016-04-24 ENCOUNTER — Encounter (HOSPITAL_COMMUNITY): Payer: Self-pay | Admitting: Certified Registered Nurse Anesthetist

## 2016-04-24 MED ORDER — CEFAZOLIN SODIUM-DEXTROSE 2-4 GM/100ML-% IV SOLN
2.0000 g | INTRAVENOUS | Status: AC
  Administered 2016-04-25 (×2): 2 g via INTRAVENOUS
  Filled 2016-04-24: qty 100

## 2016-04-24 MED ORDER — DEXAMETHASONE SODIUM PHOSPHATE 10 MG/ML IJ SOLN
10.0000 mg | INTRAMUSCULAR | Status: AC
  Administered 2016-04-25: 10 mg via INTRAVENOUS
  Filled 2016-04-24: qty 1

## 2016-04-24 NOTE — Anesthesia Preprocedure Evaluation (Addendum)
Anesthesia Evaluation  Patient identified by MRN, date of birth, ID band Patient awake    Reviewed: Allergy & Precautions, NPO status , Patient's Chart, lab work & pertinent test results  History of Anesthesia Complications Negative for: history of anesthetic complications  Airway Mallampati: II  TM Distance: >3 FB Neck ROM: Full    Dental no notable dental hx.    Pulmonary shortness of breath and with exertion, pneumonia, resolved,    Pulmonary exam normal breath sounds clear to auscultation       Cardiovascular Exercise Tolerance: Poor hypertension, Pt. 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  negative genitourinary   Musculoskeletal negative musculoskeletal ROS (+) Arthritis , Fibromyalgia -  Abdominal   Peds negative pediatric ROS (+)  Hematology negative hematology ROS (+) anemia ,   Anesthesia Other Findings   Reproductive/Obstetrics negative OB ROS                           BP Readings from Last 3 Encounters:  04/17/16 130/82  03/10/16 135/60  01/30/16 122/80   Lab Results  Component Value Date   WBC 31.3 (H) 04/17/2016   HGB 11.2 (L) 04/17/2016   HCT 36.2 04/17/2016   MCV 85.2 04/17/2016   PLT 289 04/17/2016     Chemistry      Component Value Date/Time   NA 139 04/17/2016 1203   NA 142 03/10/2016 1222   K 4.9 04/17/2016 1203   K 4.0 03/10/2016 1222   CL 107 04/17/2016 1203   CO2 24 04/17/2016 1203   CO2 20 (L) 03/10/2016 1222   BUN 17 04/17/2016 1203   BUN 16.1 03/10/2016 1222   CREATININE 1.15 (H) 04/17/2016  1203   CREATININE 1.2 (H) 03/10/2016 1222      Component Value Date/Time   CALCIUM 9.9 04/17/2016 1203   CALCIUM 9.6 03/10/2016 1222   ALKPHOS 82 03/10/2016 1222   AST 50 (H) 03/10/2016 1222   ALT 55 03/10/2016 1222   BILITOT <0.30 03/10/2016 1222     Lab Results  Component Value Date   INR 0.94 07/29/2013   No results found for: HGBA1C  TTE 10/24/15 LV mild concentric hypertrophy Grade 1 diastolic heart failure Calculated EF 57% Trace MR Trace TR No evidence of pulmonary hypertension   Anesthesia Physical Anesthesia Plan  ASA: III  Anesthesia Plan: General   Post-op Pain Management:    Induction: Intravenous  Airway Management Planned: Oral ETT  Additional Equipment:   Intra-op Plan:   Post-operative Plan: Extubation in OR  Informed Consent: I have reviewed the patients History and Physical, chart, labs and discussed the procedure including the risks, benefits and alternatives for the proposed anesthesia with the patient or authorized representative who has indicated his/her understanding and acceptance.   Dental advisory given  Plan Discussed with: CRNA and Surgeon  Anesthesia Plan Comments:         Anesthesia Quick Evaluation

## 2016-04-25 ENCOUNTER — Inpatient Hospital Stay (HOSPITAL_COMMUNITY): Payer: Medicare Other

## 2016-04-25 ENCOUNTER — Encounter (HOSPITAL_COMMUNITY)
Admission: RE | Disposition: A | Discharge status: Skilled Nursing Facility | Payer: Self-pay | Source: Ambulatory Visit | Attending: Neurosurgery

## 2016-04-25 ENCOUNTER — Inpatient Hospital Stay (HOSPITAL_COMMUNITY): Payer: Medicare Other | Admitting: Certified Registered Nurse Anesthetist

## 2016-04-25 ENCOUNTER — Inpatient Hospital Stay (HOSPITAL_COMMUNITY)
Admission: RE | Admit: 2016-04-25 | Discharge: 2016-05-02 | DRG: 460 | Disposition: A | Discharge status: Skilled Nursing Facility | Payer: Medicare Other | Source: Ambulatory Visit | Attending: Neurosurgery | Admitting: Neurosurgery

## 2016-04-25 DIAGNOSIS — Z7409 Other reduced mobility: Secondary | ICD-10-CM | POA: Diagnosis not present

## 2016-04-25 DIAGNOSIS — G894 Chronic pain syndrome: Secondary | ICD-10-CM | POA: Diagnosis not present

## 2016-04-25 DIAGNOSIS — M4317 Spondylolisthesis, lumbosacral region: Secondary | ICD-10-CM | POA: Diagnosis present

## 2016-04-25 DIAGNOSIS — Z79811 Long term (current) use of aromatase inhibitors: Secondary | ICD-10-CM

## 2016-04-25 DIAGNOSIS — M533 Sacrococcygeal disorders, not elsewhere classified: Secondary | ICD-10-CM | POA: Diagnosis present

## 2016-04-25 DIAGNOSIS — Z888 Allergy status to other drugs, medicaments and biological substances status: Secondary | ICD-10-CM

## 2016-04-25 DIAGNOSIS — E039 Hypothyroidism, unspecified: Secondary | ICD-10-CM | POA: Diagnosis present

## 2016-04-25 DIAGNOSIS — Z6834 Body mass index (BMI) 34.0-34.9, adult: Secondary | ICD-10-CM | POA: Diagnosis not present

## 2016-04-25 DIAGNOSIS — M797 Fibromyalgia: Secondary | ICD-10-CM | POA: Diagnosis present

## 2016-04-25 DIAGNOSIS — C50511 Malignant neoplasm of lower-outer quadrant of right female breast: Secondary | ICD-10-CM | POA: Diagnosis not present

## 2016-04-25 DIAGNOSIS — M5136 Other intervertebral disc degeneration, lumbar region: Secondary | ICD-10-CM | POA: Diagnosis present

## 2016-04-25 DIAGNOSIS — M4806 Spinal stenosis, lumbar region: Principal | ICD-10-CM | POA: Diagnosis present

## 2016-04-25 DIAGNOSIS — M62838 Other muscle spasm: Secondary | ICD-10-CM | POA: Diagnosis not present

## 2016-04-25 DIAGNOSIS — M4310 Spondylolisthesis, site unspecified: Secondary | ICD-10-CM | POA: Diagnosis present

## 2016-04-25 DIAGNOSIS — Z79899 Other long term (current) drug therapy: Secondary | ICD-10-CM | POA: Diagnosis not present

## 2016-04-25 DIAGNOSIS — C50911 Malignant neoplasm of unspecified site of right female breast: Secondary | ICD-10-CM | POA: Diagnosis present

## 2016-04-25 DIAGNOSIS — Z419 Encounter for procedure for purposes other than remedying health state, unspecified: Secondary | ICD-10-CM

## 2016-04-25 DIAGNOSIS — H9193 Unspecified hearing loss, bilateral: Secondary | ICD-10-CM | POA: Diagnosis not present

## 2016-04-25 DIAGNOSIS — M1990 Unspecified osteoarthritis, unspecified site: Secondary | ICD-10-CM | POA: Diagnosis not present

## 2016-04-25 DIAGNOSIS — M47816 Spondylosis without myelopathy or radiculopathy, lumbar region: Secondary | ICD-10-CM | POA: Diagnosis not present

## 2016-04-25 DIAGNOSIS — D696 Thrombocytopenia, unspecified: Secondary | ICD-10-CM | POA: Diagnosis not present

## 2016-04-25 DIAGNOSIS — Z882 Allergy status to sulfonamides status: Secondary | ICD-10-CM

## 2016-04-25 DIAGNOSIS — M4807 Spinal stenosis, lumbosacral region: Secondary | ICD-10-CM | POA: Diagnosis not present

## 2016-04-25 DIAGNOSIS — Z7982 Long term (current) use of aspirin: Secondary | ICD-10-CM | POA: Diagnosis not present

## 2016-04-25 DIAGNOSIS — K219 Gastro-esophageal reflux disease without esophagitis: Secondary | ICD-10-CM | POA: Diagnosis not present

## 2016-04-25 DIAGNOSIS — F419 Anxiety disorder, unspecified: Secondary | ICD-10-CM | POA: Diagnosis not present

## 2016-04-25 DIAGNOSIS — F329 Major depressive disorder, single episode, unspecified: Secondary | ICD-10-CM | POA: Diagnosis not present

## 2016-04-25 DIAGNOSIS — I951 Orthostatic hypotension: Secondary | ICD-10-CM | POA: Diagnosis not present

## 2016-04-25 DIAGNOSIS — G9009 Other idiopathic peripheral autonomic neuropathy: Secondary | ICD-10-CM | POA: Diagnosis not present

## 2016-04-25 DIAGNOSIS — Z9011 Acquired absence of right breast and nipple: Secondary | ICD-10-CM | POA: Diagnosis not present

## 2016-04-25 DIAGNOSIS — Z90710 Acquired absence of both cervix and uterus: Secondary | ICD-10-CM

## 2016-04-25 DIAGNOSIS — Z905 Acquired absence of kidney: Secondary | ICD-10-CM

## 2016-04-25 DIAGNOSIS — G8929 Other chronic pain: Secondary | ICD-10-CM | POA: Diagnosis present

## 2016-04-25 DIAGNOSIS — M6281 Muscle weakness (generalized): Secondary | ICD-10-CM | POA: Diagnosis not present

## 2016-04-25 DIAGNOSIS — Z9221 Personal history of antineoplastic chemotherapy: Secondary | ICD-10-CM | POA: Diagnosis not present

## 2016-04-25 DIAGNOSIS — M62830 Muscle spasm of back: Secondary | ICD-10-CM | POA: Diagnosis present

## 2016-04-25 DIAGNOSIS — M4315 Spondylolisthesis, thoracolumbar region: Secondary | ICD-10-CM | POA: Diagnosis not present

## 2016-04-25 DIAGNOSIS — I10 Essential (primary) hypertension: Secondary | ICD-10-CM | POA: Diagnosis present

## 2016-04-25 DIAGNOSIS — D72820 Lymphocytosis (symptomatic): Secondary | ICD-10-CM | POA: Diagnosis not present

## 2016-04-25 DIAGNOSIS — Z981 Arthrodesis status: Secondary | ICD-10-CM | POA: Diagnosis not present

## 2016-04-25 DIAGNOSIS — M549 Dorsalgia, unspecified: Secondary | ICD-10-CM | POA: Diagnosis not present

## 2016-04-25 DIAGNOSIS — I959 Hypotension, unspecified: Secondary | ICD-10-CM | POA: Diagnosis not present

## 2016-04-25 DIAGNOSIS — R262 Difficulty in walking, not elsewhere classified: Secondary | ICD-10-CM | POA: Diagnosis not present

## 2016-04-25 DIAGNOSIS — D649 Anemia, unspecified: Secondary | ICD-10-CM | POA: Diagnosis not present

## 2016-04-25 DIAGNOSIS — C9110 Chronic lymphocytic leukemia of B-cell type not having achieved remission: Secondary | ICD-10-CM | POA: Diagnosis not present

## 2016-04-25 DIAGNOSIS — E785 Hyperlipidemia, unspecified: Secondary | ICD-10-CM | POA: Diagnosis not present

## 2016-04-25 HISTORY — PX: POSTERIOR LUMBAR FUSION 4 LEVEL: SHX6037

## 2016-04-25 HISTORY — PX: ANTERIOR LATERAL LUMBAR FUSION 4 LEVELS: SHX5552

## 2016-04-25 LAB — POCT I-STAT 3, ART BLOOD GAS (G3+)
ACID-BASE DEFICIT: 7 mmol/L — AB (ref 0.0–2.0)
Bicarbonate: 19.9 mmol/L — ABNORMAL LOW (ref 20.0–28.0)
O2 SAT: 96 %
PCO2 ART: 45.1 mmHg (ref 32.0–48.0)
PH ART: 7.248 — AB (ref 7.350–7.450)
Patient temperature: 96.9
TCO2: 21 mmol/L (ref 0–100)
pO2, Arterial: 92 mmHg (ref 83.0–108.0)

## 2016-04-25 SURGERY — ANTERIOR LATERAL LUMBAR FUSION 4 LEVELS
Anesthesia: General | Site: Spine Lumbar

## 2016-04-25 MED ORDER — ONDANSETRON HCL 4 MG/2ML IJ SOLN
INTRAMUSCULAR | Status: DC | PRN
  Administered 2016-04-25 (×2): 4 mg via INTRAVENOUS

## 2016-04-25 MED ORDER — ACETAMINOPHEN 650 MG RE SUPP: 650.0000 mg | RECTAL | Status: DC | PRN

## 2016-04-25 MED ORDER — KETAMINE HCL 10 MG/ML IJ SOLN
INTRAMUSCULAR | Status: DC | PRN
  Administered 2016-04-25: 100 mg via INTRAVENOUS
  Administered 2016-04-25: 10 mg via INTRAVENOUS
  Administered 2016-04-25: 40 mg via INTRAVENOUS
  Administered 2016-04-25: 30 mg via INTRAVENOUS
  Administered 2016-04-25: 20 mg via INTRAVENOUS

## 2016-04-25 MED ORDER — CEFAZOLIN IN D5W 1 GM/50ML IV SOLN
1.0000 g | Freq: Three times a day (TID) | INTRAVENOUS | Status: AC
  Administered 2016-04-25 – 2016-04-26 (×2): 1 g via INTRAVENOUS
  Filled 2016-04-25 (×2): qty 50

## 2016-04-25 MED ORDER — LACTATED RINGERS IV SOLN
INTRAVENOUS | Status: DC
  Administered 2016-04-25 (×3): via INTRAVENOUS

## 2016-04-25 MED ORDER — CEFAZOLIN SODIUM 1 G IJ SOLR
INTRAMUSCULAR | Status: AC
Start: 2016-04-25 — End: 2016-04-25
  Filled 2016-04-25: qty 20

## 2016-04-25 MED ORDER — HYDROMORPHONE HCL 1 MG/ML IJ SOLN
INTRAMUSCULAR | Status: AC
  Filled 2016-04-25: qty 1

## 2016-04-25 MED ORDER — PHENYLEPHRINE 40 MCG/ML (10ML) SYRINGE FOR IV PUSH (FOR BLOOD PRESSURE SUPPORT)
PREFILLED_SYRINGE | INTRAVENOUS | Status: AC
  Filled 2016-04-25: qty 10

## 2016-04-25 MED ORDER — DIAZEPAM 5 MG PO TABS
5.0000 mg | ORAL_TABLET | Freq: Four times a day (QID) | ORAL | Status: DC | PRN
  Administered 2016-04-25 – 2016-04-26 (×3): 5 mg via ORAL
  Administered 2016-04-29: 10 mg via ORAL
  Administered 2016-04-30 – 2016-05-02 (×2): 5 mg via ORAL
  Filled 2016-04-25 (×4): qty 1
  Filled 2016-04-25: qty 2
  Filled 2016-04-25: qty 1

## 2016-04-25 MED ORDER — PHENOL 1.4 % MT LIQD: 1.0000 | OROMUCOSAL | Status: DC | PRN

## 2016-04-25 MED ORDER — HYDROCODONE-ACETAMINOPHEN 5-325 MG PO TABS
1.0000 | ORAL_TABLET | ORAL | Status: DC | PRN
  Administered 2016-04-25: 2 via ORAL
  Administered 2016-04-27: 1 via ORAL
  Administered 2016-04-28: 2 via ORAL
  Filled 2016-04-25: qty 2
  Filled 2016-04-25: qty 1
  Filled 2016-04-25: qty 2

## 2016-04-25 MED ORDER — POLYETHYLENE GLYCOL 3350 17 G PO PACK
17.0000 g | PACK | Freq: Every day | ORAL | Status: DC | PRN
  Administered 2016-05-01 – 2016-05-02 (×2): 17 g via ORAL
  Filled 2016-04-25 (×2): qty 1

## 2016-04-25 MED ORDER — VITAMIN D-3 25 MCG (1000 UT) PO CAPS: ORAL_CAPSULE | Freq: Every day | ORAL | Status: DC

## 2016-04-25 MED ORDER — SUGAMMADEX SODIUM 200 MG/2ML IV SOLN
INTRAVENOUS | Status: DC | PRN
  Administered 2016-04-25: 200 mg via INTRAVENOUS

## 2016-04-25 MED ORDER — FENTANYL CITRATE (PF) 100 MCG/2ML IJ SOLN
INTRAMUSCULAR | Status: DC | PRN
  Administered 2016-04-25 (×5): 25 ug via INTRAVENOUS
  Administered 2016-04-25: 50 ug via INTRAVENOUS
  Administered 2016-04-25: 25 ug via INTRAVENOUS

## 2016-04-25 MED ORDER — VITAMIN E 45 MG (100 UNIT) PO CAPS
100.0000 [IU] | ORAL_CAPSULE | Freq: Every day | ORAL | Status: DC
  Administered 2016-04-26 – 2016-05-02 (×7): 100 [IU] via ORAL
  Filled 2016-04-25 (×8): qty 1

## 2016-04-25 MED ORDER — VANCOMYCIN HCL 1000 MG IV SOLR
INTRAVENOUS | Status: DC | PRN
  Administered 2016-04-25: 1000 mg via TOPICAL

## 2016-04-25 MED ORDER — FENTANYL CITRATE (PF) 100 MCG/2ML IJ SOLN
INTRAMUSCULAR | Status: AC
  Filled 2016-04-25: qty 2

## 2016-04-25 MED ORDER — HYDROMORPHONE HCL 1 MG/ML IJ SOLN
0.5000 mg | INTRAMUSCULAR | Status: DC | PRN
  Administered 2016-04-25 (×3): 0.5 mg via INTRAVENOUS
  Administered 2016-04-26 (×2): 1 mg via INTRAVENOUS
  Administered 2016-04-26: 0.5 mg via INTRAVENOUS
  Administered 2016-04-26 (×3): 1 mg via INTRAVENOUS
  Administered 2016-04-26: 0.5 mg via INTRAVENOUS
  Administered 2016-04-27: 1 mg via INTRAVENOUS
  Filled 2016-04-25 (×9): qty 1

## 2016-04-25 MED ORDER — BISACODYL 10 MG RE SUPP: 10.0000 mg | Freq: Every day | RECTAL | Status: DC | PRN

## 2016-04-25 MED ORDER — ALBUMIN HUMAN 5 % IV SOLN
INTRAVENOUS | Status: DC | PRN
  Administered 2016-04-25 (×2): via INTRAVENOUS

## 2016-04-25 MED ORDER — DOCUSATE SODIUM 100 MG PO CAPS
100.0000 mg | ORAL_CAPSULE | Freq: Two times a day (BID) | ORAL | Status: DC
  Administered 2016-04-25 – 2016-05-02 (×13): 100 mg via ORAL
  Filled 2016-04-25 (×14): qty 1

## 2016-04-25 MED ORDER — ACETAMINOPHEN 10 MG/ML IV SOLN
INTRAVENOUS | Status: AC
  Filled 2016-04-25: qty 100

## 2016-04-25 MED ORDER — AMITRIPTYLINE HCL 75 MG PO TABS
150.0000 mg | ORAL_TABLET | Freq: Every day | ORAL | Status: DC
  Administered 2016-04-26 – 2016-05-01 (×7): 150 mg via ORAL
  Filled 2016-04-25 (×4): qty 2
  Filled 2016-04-25: qty 6
  Filled 2016-04-25: qty 2
  Filled 2016-04-25 (×3): qty 6
  Filled 2016-04-25: qty 2
  Filled 2016-04-25: qty 6

## 2016-04-25 MED ORDER — LACTATED RINGERS IV SOLN
INTRAVENOUS | Status: DC | PRN
  Administered 2016-04-25 (×2): via INTRAVENOUS

## 2016-04-25 MED ORDER — PROPOFOL 10 MG/ML IV BOLUS
INTRAVENOUS | Status: DC | PRN
  Administered 2016-04-25 (×2): 50 mg via INTRAVENOUS

## 2016-04-25 MED ORDER — FLEET ENEMA 7-19 GM/118ML RE ENEM
1.0000 | ENEMA | Freq: Once | RECTAL | Status: AC | PRN
  Administered 2016-05-02: 1 via RECTAL
  Filled 2016-04-25: qty 1

## 2016-04-25 MED ORDER — LEVOTHYROXINE SODIUM 50 MCG PO TABS
50.0000 ug | ORAL_TABLET | Freq: Every day | ORAL | Status: DC
  Administered 2016-04-26 – 2016-05-02 (×7): 50 ug via ORAL
  Filled 2016-04-25 (×7): qty 1

## 2016-04-25 MED ORDER — SUFENTANIL CITRATE 50 MCG/ML IV SOLN
INTRAVENOUS | Status: AC
  Filled 2016-04-25: qty 2

## 2016-04-25 MED ORDER — GABAPENTIN 300 MG PO CAPS
300.0000 mg | ORAL_CAPSULE | Freq: Three times a day (TID) | ORAL | Status: DC
  Administered 2016-04-25 – 2016-05-02 (×20): 300 mg via ORAL
  Filled 2016-04-25 (×20): qty 1

## 2016-04-25 MED ORDER — SUCCINYLCHOLINE CHLORIDE 200 MG/10ML IV SOSY
PREFILLED_SYRINGE | INTRAVENOUS | Status: DC | PRN
  Administered 2016-04-25: 100 mg via INTRAVENOUS

## 2016-04-25 MED ORDER — VITAMIN C 500 MG PO TABS
1000.0000 mg | ORAL_TABLET | Freq: Every day | ORAL | Status: DC
  Administered 2016-04-26 – 2016-05-02 (×7): 1000 mg via ORAL
  Filled 2016-04-25 (×7): qty 2

## 2016-04-25 MED ORDER — MIDAZOLAM HCL 5 MG/5ML IJ SOLN
INTRAMUSCULAR | Status: DC | PRN
  Administered 2016-04-25 (×2): 1 mg via INTRAVENOUS

## 2016-04-25 MED ORDER — THROMBIN 20000 UNITS EX SOLR
CUTANEOUS | Status: DC | PRN
  Administered 2016-04-25 (×2): via TOPICAL

## 2016-04-25 MED ORDER — BACITRACIN 50000 UNITS IM SOLR
INTRAMUSCULAR | Status: DC | PRN
  Administered 2016-04-25: 10:00:00

## 2016-04-25 MED ORDER — VITAMIN D 1000 UNITS PO TABS
1000.0000 [IU] | ORAL_TABLET | Freq: Every day | ORAL | Status: DC
  Administered 2016-04-26 – 2016-05-02 (×7): 1000 [IU] via ORAL
  Filled 2016-04-25 (×7): qty 1

## 2016-04-25 MED ORDER — BUPIVACAINE LIPOSOME 1.3 % IJ SUSP
20.0000 mL | Freq: Once | INTRAMUSCULAR | Status: DC
  Filled 2016-04-25: qty 20

## 2016-04-25 MED ORDER — APOAEQUORIN 10 MG PO CAPS: 50.0000 mg | ORAL_CAPSULE | Freq: Every day | ORAL | Status: DC

## 2016-04-25 MED ORDER — ANASTROZOLE 1 MG PO TABS
1.0000 mg | ORAL_TABLET | Freq: Every day | ORAL | Status: DC
  Administered 2016-04-26 – 2016-05-02 (×7): 1 mg via ORAL
  Filled 2016-04-25 (×7): qty 1

## 2016-04-25 MED ORDER — SUGAMMADEX SODIUM 200 MG/2ML IV SOLN
INTRAVENOUS | Status: AC
  Filled 2016-04-25: qty 2

## 2016-04-25 MED ORDER — 0.9 % SODIUM CHLORIDE (POUR BTL) OPTIME
TOPICAL | Status: DC | PRN
  Administered 2016-04-25: 1000 mL

## 2016-04-25 MED ORDER — LIDOCAINE 2% (20 MG/ML) 5 ML SYRINGE
INTRAMUSCULAR | Status: DC | PRN
  Administered 2016-04-25: 80 mg via INTRAVENOUS

## 2016-04-25 MED ORDER — ONDANSETRON HCL 4 MG/2ML IJ SOLN
INTRAMUSCULAR | Status: AC
  Filled 2016-04-25: qty 2

## 2016-04-25 MED ORDER — SODIUM CHLORIDE 0.9% FLUSH: 3.0000 mL | INTRAVENOUS | Status: DC | PRN

## 2016-04-25 MED ORDER — SUFENTANIL CITRATE 50 MCG/ML IV SOLN
INTRAVENOUS | Status: DC | PRN
  Administered 2016-04-25: .2 ug/kg/h via INTRAVENOUS

## 2016-04-25 MED ORDER — ALBUMIN HUMAN 5 % IV SOLN
INTRAVENOUS | Status: AC
  Filled 2016-04-25: qty 250

## 2016-04-25 MED ORDER — ACETAMINOPHEN 325 MG PO TABS: 650.0000 mg | ORAL_TABLET | ORAL | Status: DC | PRN

## 2016-04-25 MED ORDER — ASPIRIN EC 81 MG PO TBEC
81.0000 mg | DELAYED_RELEASE_TABLET | Freq: Every day | ORAL | Status: DC
  Administered 2016-04-26 – 2016-05-02 (×7): 81 mg via ORAL
  Filled 2016-04-25 (×7): qty 1

## 2016-04-25 MED ORDER — PHENYLEPHRINE HCL 10 MG/ML IJ SOLN
INTRAVENOUS | Status: DC | PRN
  Administered 2016-04-25 (×2): 20 ug/min via INTRAVENOUS

## 2016-04-25 MED ORDER — MENTHOL 3 MG MT LOZG: 1.0000 | LOZENGE | OROMUCOSAL | Status: DC | PRN

## 2016-04-25 MED ORDER — OXYCODONE-ACETAMINOPHEN 5-325 MG PO TABS
1.0000 | ORAL_TABLET | ORAL | Status: DC | PRN
  Administered 2016-04-26: 1 via ORAL
  Administered 2016-04-26 – 2016-04-28 (×6): 2 via ORAL
  Filled 2016-04-25: qty 2
  Filled 2016-04-25: qty 1
  Filled 2016-04-25 (×5): qty 2

## 2016-04-25 MED ORDER — SODIUM CHLORIDE 0.9 % IV SOLN: 250.0000 mL | INTRAVENOUS | Status: DC

## 2016-04-25 MED ORDER — VITAMIN B-12 1000 MCG PO TABS
2500.0000 ug | ORAL_TABLET | Freq: Every day | ORAL | Status: DC
  Administered 2016-04-26 – 2016-05-02 (×7): 2500 ug via ORAL
  Filled 2016-04-25: qty 2
  Filled 2016-04-25: qty 1
  Filled 2016-04-25: qty 5
  Filled 2016-04-25 (×2): qty 1
  Filled 2016-04-25: qty 2
  Filled 2016-04-25: qty 1
  Filled 2016-04-25: qty 2

## 2016-04-25 MED ORDER — PHENYLEPHRINE HCL 10 MG/ML IJ SOLN
INTRAMUSCULAR | Status: DC | PRN
  Administered 2016-04-25: 40 ug via INTRAVENOUS
  Administered 2016-04-25 (×3): 80 ug via INTRAVENOUS
  Administered 2016-04-25 (×3): 40 ug via INTRAVENOUS

## 2016-04-25 MED ORDER — SODIUM CHLORIDE 0.9 % IV SOLN
INTRAVENOUS | Status: DC
  Administered 2016-04-25 – 2016-04-29 (×6): via INTRAVENOUS

## 2016-04-25 MED ORDER — ROCURONIUM BROMIDE 10 MG/ML (PF) SYRINGE
PREFILLED_SYRINGE | INTRAVENOUS | Status: DC | PRN
  Administered 2016-04-25: 40 mg via INTRAVENOUS
  Administered 2016-04-25: 10 mg via INTRAVENOUS
  Administered 2016-04-25 (×2): 30 mg via INTRAVENOUS

## 2016-04-25 MED ORDER — PANTOPRAZOLE SODIUM 40 MG PO TBEC
80.0000 mg | DELAYED_RELEASE_TABLET | Freq: Every day | ORAL | Status: DC
  Administered 2016-04-26 – 2016-05-02 (×7): 80 mg via ORAL
  Filled 2016-04-25 (×7): qty 2

## 2016-04-25 MED ORDER — PROPOFOL 10 MG/ML IV BOLUS
INTRAVENOUS | Status: AC
  Filled 2016-04-25: qty 20

## 2016-04-25 MED ORDER — PROMETHAZINE HCL 25 MG/ML IJ SOLN: 6.2500 mg | INTRAMUSCULAR | Status: DC | PRN

## 2016-04-25 MED ORDER — LIDOCAINE 2% (20 MG/ML) 5 ML SYRINGE
INTRAMUSCULAR | Status: AC
  Filled 2016-04-25: qty 5

## 2016-04-25 MED ORDER — VANCOMYCIN HCL 1000 MG IV SOLR
INTRAVENOUS | Status: AC
  Filled 2016-04-25: qty 1000

## 2016-04-25 MED ORDER — ONDANSETRON HCL 4 MG/2ML IJ SOLN: 4.0000 mg | INTRAMUSCULAR | Status: DC | PRN

## 2016-04-25 MED ORDER — CYANOCOBALAMIN 2500 MCG PO CHEW: 2500.0000 ug | CHEWABLE_TABLET | Freq: Every day | ORAL | Status: DC

## 2016-04-25 MED ORDER — MIDAZOLAM HCL 2 MG/2ML IJ SOLN
INTRAMUSCULAR | Status: AC
  Filled 2016-04-25: qty 2

## 2016-04-25 MED ORDER — SODIUM CHLORIDE 0.9% FLUSH
3.0000 mL | Freq: Two times a day (BID) | INTRAVENOUS | Status: DC
  Administered 2016-04-25 – 2016-05-02 (×10): 3 mL via INTRAVENOUS

## 2016-04-25 MED ORDER — ROCURONIUM BROMIDE 10 MG/ML (PF) SYRINGE
PREFILLED_SYRINGE | INTRAVENOUS | Status: AC
  Filled 2016-04-25: qty 20

## 2016-04-25 MED ORDER — ACETAMINOPHEN 10 MG/ML IV SOLN
INTRAVENOUS | Status: DC | PRN
  Administered 2016-04-25: 1000 mg via INTRAVENOUS

## 2016-04-25 MED ORDER — BUPIVACAINE HCL (PF) 0.25 % IJ SOLN
INTRAMUSCULAR | Status: DC | PRN
  Administered 2016-04-25: 30 mL

## 2016-04-25 MED ORDER — ALPRAZOLAM 0.5 MG PO TABS
0.5000 mg | ORAL_TABLET | Freq: Three times a day (TID) | ORAL | Status: DC | PRN
  Filled 2016-04-25: qty 1

## 2016-04-25 MED ORDER — VILAZODONE HCL 40 MG PO TABS
40.0000 mg | ORAL_TABLET | Freq: Every day | ORAL | Status: DC
  Administered 2016-04-26: 40 mg via ORAL
  Filled 2016-04-25 (×2): qty 1

## 2016-04-25 MED ORDER — ALBUMIN HUMAN 5 % IV SOLN
12.5000 g | Freq: Once | INTRAVENOUS | Status: AC
  Administered 2016-04-25: 12.5 g via INTRAVENOUS

## 2016-04-25 MED ORDER — SODIUM CHLORIDE 0.9 % IJ SOLN
INTRAMUSCULAR | Status: AC
  Filled 2016-04-25: qty 20

## 2016-04-25 MED ORDER — BREXPIPRAZOLE 2 MG PO TABS
2.0000 mg | ORAL_TABLET | Freq: Every day | ORAL | Status: DC
  Administered 2016-04-25 – 2016-05-01 (×7): 2 mg via ORAL
  Filled 2016-04-25 (×7): qty 1

## 2016-04-25 MED ORDER — KETAMINE HCL-SODIUM CHLORIDE 100-0.9 MG/10ML-% IV SOSY
PREFILLED_SYRINGE | INTRAVENOUS | Status: AC
  Filled 2016-04-25: qty 20

## 2016-04-25 MED ORDER — HYDROMORPHONE HCL 1 MG/ML IJ SOLN
0.2500 mg | INTRAMUSCULAR | Status: DC | PRN
  Administered 2016-04-25 (×4): 0.5 mg via INTRAVENOUS

## 2016-04-25 SURGICAL SUPPLY — 80 items
ADH SKN CLS APL DERMABOND .7 (GAUZE/BANDAGES/DRESSINGS) ×6
APL SKNCLS STERI-STRIP NONHPOA (GAUZE/BANDAGES/DRESSINGS) ×2
BAG DECANTER FOR FLEXI CONT (MISCELLANEOUS) ×3 IMPLANT
BENZOIN TINCTURE PRP APPL 2/3 (GAUZE/BANDAGES/DRESSINGS) ×3 IMPLANT
BONE MATRIX OSTEOCEL PRO MED (Bone Implant) ×3 IMPLANT
BUR CUTTER 7.0 ROUND (BURR) IMPLANT
BUR MATCHSTICK NEURO 3.0 LAGG (BURR) ×3 IMPLANT
CANISTER SUCT 3000ML PPV (MISCELLANEOUS) ×3 IMPLANT
CONT SPEC 4OZ CLIKSEAL STRL BL (MISCELLANEOUS) ×3 IMPLANT
COROENT XL 8X22X45 (Orthopedic Implant) ×1 IMPLANT
COROENT XL-W 10X22X50 (Orthopedic Implant) ×2 IMPLANT
COVER BACK TABLE 60X90IN (DRAPES) ×3 IMPLANT
DERMABOND ADVANCED (GAUZE/BANDAGES/DRESSINGS) ×3
DERMABOND ADVANCED .7 DNX12 (GAUZE/BANDAGES/DRESSINGS) ×2 IMPLANT
DEVICE INTERBODY ELEVATE 23X8 (Cage) ×6 IMPLANT
DIGITIZER BENDINI (MISCELLANEOUS) ×1 IMPLANT
DRAPE C-ARM 42X72 X-RAY (DRAPES) ×9 IMPLANT
DRAPE C-ARMOR (DRAPES) ×3 IMPLANT
DRAPE HALF SHEET 40X57 (DRAPES) ×1 IMPLANT
DRAPE LAPAROTOMY 100X72X124 (DRAPES) ×6 IMPLANT
DRAPE POUCH INSTRU U-SHP 10X18 (DRAPES) ×6 IMPLANT
DRAPE SURG 17X23 STRL (DRAPES) ×12 IMPLANT
DRSG OPSITE POSTOP 4X10 (GAUZE/BANDAGES/DRESSINGS) ×2 IMPLANT
DRSG OPSITE POSTOP 4X6 (GAUZE/BANDAGES/DRESSINGS) ×1 IMPLANT
DURAPREP 26ML APPLICATOR (WOUND CARE) ×4 IMPLANT
ELECT REM PT RETURN 9FT ADLT (ELECTROSURGICAL) ×6
ELECTRODE REM PT RTRN 9FT ADLT (ELECTROSURGICAL) ×4 IMPLANT
EVACUATOR 1/8 PVC DRAIN (DRAIN) ×2 IMPLANT
GAUZE SPONGE 4X4 12PLY STRL (GAUZE/BANDAGES/DRESSINGS) ×2 IMPLANT
GAUZE SPONGE 4X4 16PLY XRAY LF (GAUZE/BANDAGES/DRESSINGS) ×2 IMPLANT
GLOVE BIO SURGEON STRL SZ8 (GLOVE) ×1 IMPLANT
GLOVE BIOGEL PI IND STRL 7.0 (GLOVE) IMPLANT
GLOVE BIOGEL PI IND STRL 7.5 (GLOVE) IMPLANT
GLOVE BIOGEL PI IND STRL 8.5 (GLOVE) IMPLANT
GLOVE BIOGEL PI INDICATOR 7.0 (GLOVE) ×3
GLOVE BIOGEL PI INDICATOR 7.5 (GLOVE) ×4
GLOVE BIOGEL PI INDICATOR 8.5 (GLOVE) ×1
GLOVE ECLIPSE 9.0 STRL (GLOVE) ×8 IMPLANT
GLOVE SS BIOGEL STRL SZ 7 (GLOVE) IMPLANT
GLOVE SUPERSENSE BIOGEL SZ 7 (GLOVE) ×1
GLOVE SURG SS PI 6.5 STRL IVOR (GLOVE) ×4 IMPLANT
GLOVE SURG SS PI 7.0 STRL IVOR (GLOVE) ×1 IMPLANT
GOWN STRL REUS W/ TWL LRG LVL3 (GOWN DISPOSABLE) IMPLANT
GOWN STRL REUS W/ TWL XL LVL3 (GOWN DISPOSABLE) ×4 IMPLANT
GOWN STRL REUS W/TWL LRG LVL3 (GOWN DISPOSABLE) ×9
GOWN STRL REUS W/TWL XL LVL3 (GOWN DISPOSABLE) ×12
GRAFT BN 10X1XDBM MAGNIFUSE (Bone Implant) IMPLANT
GRAFT BONE MAGNIFUSE 1X10CM (Bone Implant) ×12 IMPLANT
IMPL COROENT XL 8X8X45 (Intraocular Lens) IMPLANT
IMPLANT COROENT XL 8X8X45 (Intraocular Lens) ×3 IMPLANT
KIT BASIN OR (CUSTOM PROCEDURE TRAY) ×3 IMPLANT
KIT DILATOR XLIF 5 (KITS) IMPLANT
KIT ROOM TURNOVER OR (KITS) ×3 IMPLANT
KIT SURGICAL ACCESS MAXCESS 4 (KITS) ×1 IMPLANT
KIT XLIF (KITS) ×1
MODULE NVM5 NEXT GEN EMG (NEEDLE) ×1 IMPLANT
NEEDLE HYPO 22GX1.5 SAFETY (NEEDLE) ×3 IMPLANT
NS IRRIG 1000ML POUR BTL (IV SOLUTION) ×3 IMPLANT
PACK LAMINECTOMY NEURO (CUSTOM PROCEDURE TRAY) ×3 IMPLANT
PUTTY BONE DBX 2.5 MIS (Bone Implant) ×1 IMPLANT
PUTTY BONE DBX 5CC MIX (Putty) ×1 IMPLANT
ROD RELINE-O 5.5X300 STRT NS (Rod) IMPLANT
ROD RELINE-O 5.5X300MM STRT (Rod) ×6 IMPLANT
SCREW LOCK RELINE 5.5 TULIP (Screw) ×20 IMPLANT
SCREW RELINE MAS POLY 5.5X40 (Screw) ×4 IMPLANT
SCREW RELINE-O POLY 5.5X40 (Screw) ×6 IMPLANT
SCREW RELINE-O POLY 6.5X40 (Screw) ×4 IMPLANT
SCREW RELINE-O POLY 6.5X45 (Screw) ×6 IMPLANT
SPACER SPNL XLORDOTIC 23X8X (Cage) IMPLANT
SPCR SPNL XLORDOTIC 23X8X (Cage) ×4 IMPLANT
SPONGE SURGIFOAM ABS GEL 100 (HEMOSTASIS) ×4 IMPLANT
STRIP CLOSURE SKIN 1/2X4 (GAUZE/BANDAGES/DRESSINGS) ×6 IMPLANT
SUT VIC AB 0 CT1 18XCR BRD8 (SUTURE) ×4 IMPLANT
SUT VIC AB 0 CT1 8-18 (SUTURE) ×9
SUT VIC AB 2-0 CT1 18 (SUTURE) ×8 IMPLANT
SUT VIC AB 3-0 SH 8-18 (SUTURE) ×8 IMPLANT
TOWEL OR 17X24 6PK STRL BLUE (TOWEL DISPOSABLE) ×3 IMPLANT
TOWEL OR 17X26 10 PK STRL BLUE (TOWEL DISPOSABLE) ×3 IMPLANT
TRAY FOLEY W/METER SILVER 16FR (SET/KITS/TRAYS/PACK) ×3 IMPLANT
WATER STERILE IRR 1000ML POUR (IV SOLUTION) ×3 IMPLANT

## 2016-04-25 NOTE — Progress Notes (Signed)
eLink Physician-Brief Progress Note Patient Name: [name] [name] DOB: [birth year] MRN: [id number]   Date of Service  04/25/2016  HPI/Events of Note  S/p Left L1-2, L2-3, L3-4, L4-5 anterior lateral interbody decompression and fusion with interbody peek cage, morcellized allograft BP soft on camera check  eICU Interventions  Monitor closely in ICU     Intervention Category Evaluation Type: New Patient Evaluation  Simonne Maffucci 04/25/2016, 6:29 PM

## 2016-04-25 NOTE — Transfer of Care (Signed)
Immediate Anesthesia Transfer of Care Note  Patient: [name] [name]  Procedure(s) Performed: Procedure(s): LEFT LUMBAR ONE-TWO, LUMBAR TWO-THREE, LUMBAR THREE-FOUR, LUMBAR FOUR-FIVE ANTERIOR LATERAL LUMBAR FUSION (Left) LUMBAR FIVE-SACRAL ONE POSTERIOR LUMBAR INTERBODY FUSION, THORACIC NINE-SACRAL ONE POSTERIOR LATERAL ARTHRODESIS WITH PEDICLE SCREWS (N/A)  Patient Location: PACU  Anesthesia Type:General  Level of Consciousness: oriented, sedated and patient cooperative  Airway & Oxygen Therapy: Patient Spontanous Breathing and Patient connected to face mask oxygen  Post-op Assessment: Report given to RN and Post -op Vital signs reviewed and stable  Post vital signs: Reviewed  Last Vitals:  Vitals:   04/25/16 0633 04/25/16 1605  BP: (!) 125/56   Pulse: 88   Resp: 20   Temp: 36.7 C 36.8 C    Last Pain:  Vitals:   04/25/16 0708  PainSc: 0-No pain      Patients Stated Pain Goal: 3 ([phone] [id number])  Complications: No apparent anesthesia complications

## 2016-04-25 NOTE — Op Note (Signed)
Date of procedure: 04/25/2016  Date of dictation: Same  Service: Neurosurgery  Preoperative diagnosis: L1-2, L2-3, L3-4, L4-5 degenerative disc disease with spondylosis and stenosis with chronic pain. L5-S1 grade 1 degenerative spondylolisthesis with stenosis  Postoperative diagnosis: Same  Procedure Name: Left L1-2, L2-3, L3-4, L4-5 anterior lateral interbody decompression and fusion with interbody peek cage, morcellized allograft.  L5-S1 posterior lumbar interbody fusion utilizing expandable cages and local autograft.  T9-S1 posterior lateral arthrodesis utilizing segmental pedicle screw fixation, local autograft, and morselized allograft.  Surgeon:Kazimir Hartnett A.Caidyn Blossom, M.D.  Asst. Surgeon: Vertell Limber  Anesthesia: General  Indication: 70 year old female with intractable back pain and bilateral lower extremity symptoms failing conservative management. Workup demonstrates evidence of severe multilevel disc degeneration with associated disc space collapse, lateral listhesis, and severe foraminal stenosis. Patient has evidence of L5-S1 degenerative spondylolisthesis as well. She is failed all efforts at conservative management. She presents now for multilevel decompression and fusion.  Operative note: After induction of anesthesia, patient positioned in the right lateral decubitus position and appropriately padded. Left flank and lumbar region prepped and draped sterilely. Patient was positioned with the bed flexed and she was secured to the table. Localizing fluoroscopy was used throughout. The patient was also monitored with intraoperative EMGs. Patient's left flank and lumbar region prepped and draped sterilely. An incision was made overlying the L3 vertebral body. A secondary incision was made in the left flank. 1 dissection was then made into the retroperitoneal space. The peritoneal sac and contents were mobilized anteriorly. A dilator was then passed through the lateral abdominal wall through the  left lateral incision. This was then docked into the disc space at L4-5. The dilator was stimulated and it was found to be free of any adjacent branches of the lumbar plexus. Guidewire was then inserted and the L4-5 disc space. The dilator was then sequentially enlarged and a self-retaining retractor was placed. Intraoperative stimulation and monitoring was done between each step. The retractor was secured to the bed and expanded. The disc space was identified. Direct stimulation was performed and there was no evidence of any branches of the lumbar probably plexus present. Disc spaces then incised. Discectomies then performed using various instruments. Contralateral Lee's performed using Cobb elevators. Disc space was then progressively distracted. A 10 mm lordotic cage was found to be most appropriate. A 10 mm x 50 mm x 22 mm cage was packed with bone graft and osteo-cell plus. Slides were placed into the disc space after the disc space was completely prepared for interbody fusion and the cage was then impacted into place and confirmed to be in good position in both the AP and lateral fluoroscopic planes. Slides and applier were removed. The retractor was removed. The procedures then repeated again without complication again using neural monitoring at L3-4, L2-3 and L1-2.  Wounds were then irrigated out like solution. Wounds are then closed in layers with Vicryl sutures. Patient then remained under general anesthesia. She was taken off the operative bed and then replaced on the operative bed in the prone position where she was appropriately padded onto bolsters. Patient's thoracic and lumbar region were prepped and draped sterilely. Incision made from T9-S1. Subperiosteal dissection performed bilaterally. Retractor placed. Fluoroscopy used. Levels confirmed. At the L5-S1 interspace bilateral decompressive laminotomies were performed using Leksell rongeurs care surgeries a high-speed drill. Bone graft was saved.  Ligament flavum elevated. Underlying thecal sac and L5 and S1 nerve roots identified. Disc spaces then incised. A desk discectomies and performed bilaterally. Disc space prepared  for interbody fusion. Disc space distracted. With a distractor left on the patient's right side nerve roots were protected on the left side. Disc space was finally prepared for interbody fusion. An 8 mm x 12 expandable [employer] cage packed with locally harvested autograft was then impacted into place. The cages and in then expanded to its full extent. It was found to be well position by fluoroscopy. The distractor was removed patient's right side. Disc space was prepared on the right side. Disc space was then packed with morselized autograft. A second cage packed with autograft was then impacted into place and expanded to its full extent. Pedicles of T9, T10, T10-11, T12, L1, L2, L3, L4, L5 and S1 were identified using surface landmarks and intraoperative fluoroscopy. Superficial bone overlying the pedicle was then removed using high-speed drill. Each pedicles and probed using a pedicle awl each pedicle awl track was tapped. Each pedicle awl track was probed and found to be solidly within bone.Nuvasive pedicle screws were placed bilaterally at the aforementioned levels. 5.5 mm diameter screws were placed from T9-L1. 6.50 m screws were placed from L2-S1. Rods were bent and then placed over the screw heads from T9-S1. Locking caps and placed over the screws. Locking caps were then engaged with the construct under mild compression. Final images revealed good position of the bone grafts and hardware at the proper upper level with normal alignment of the spine. Wound is then irrigated one final time. Bone was decorticated using high-speed drill. Local autograft mixed with Master graft was packed posteriorly and posterior laterally for later fusion. Vancomycin powder was placed the deep wound space. Wounds and close in layers with Vicryl  sutures. Steri-Strips and sterile dressing were applied. No apparent complications. Patient tolerated the procedure and she returns to the recovery room postop.

## 2016-04-25 NOTE — Progress Notes (Signed)
pts BP low, still with bilateral nasal trumpets, Dr Tobias Alexander aware and orders noted

## 2016-04-25 NOTE — Brief Op Note (Signed)
04/25/2016  3:34 PM  PATIENT:  [name] [name]  70 y.o. female  PRE-OPERATIVE DIAGNOSIS:  Stenosis  POST-OPERATIVE DIAGNOSIS:  Stenosis  PROCEDURE:  Procedure(s): LEFT LUMBAR ONE-TWO, LUMBAR TWO-THREE, LUMBAR THREE-FOUR, LUMBAR FOUR-FIVE ANTERIOR LATERAL LUMBAR FUSION (Left) LUMBAR FIVE-SACRAL ONE POSTERIOR LUMBAR INTERBODY FUSION, THORACIC NINE-SACRAL ONE POSTERIOR LATERAL ARTHRODESIS WITH PEDICLE SCREWS (N/A)  SURGEON:  Surgeon(s) and Role:    * Earnie Larsson, MD - Primary    * Erline Levine, MD - Assisting  PHYSICIAN ASSISTANT:   ASSISTANTS:    ANESTHESIA:   general  EBL:  Total I/O In: [id number] [I.V.:3000; Blood:120; IV Piggyback:500] Out: 790 [Urine:290; Blood:500]  BLOOD ADMINISTERED:none  DRAINS: (med) Hemovact drain(s) in the epidural space with  Suction Open   LOCAL MEDICATIONS USED:  MARCAINE     SPECIMEN:  No Specimen  DISPOSITION OF SPECIMEN:  N/A  COUNTS:  YES  TOURNIQUET:  * No tourniquets in log *  DICTATION: .Dragon Dictation  PLAN OF CARE: Admit to inpatient   PATIENT DISPOSITION:  PACU - hemodynamically stable.   Delay start of Pharmacological VTE agent (>24hrs) due to surgical blood loss or risk of bleeding: yes

## 2016-04-25 NOTE — Anesthesia Procedure Notes (Signed)
Procedure Name: Intubation Date/Time: 04/25/2016 8:32 AM Performed by: Mervyn Gay Pre-anesthesia Checklist: Patient identified, Patient being monitored, Timeout performed, Emergency Drugs available and Suction available Patient Re-evaluated:Patient Re-evaluated prior to inductionOxygen Delivery Method: Circle System Utilized Preoxygenation: Pre-oxygenation with 100% oxygen Intubation Type: IV induction Ventilation: Mask ventilation without difficulty Laryngoscope Size: Miller and 3 Grade View: Grade I Tube type: Oral Tube size: 7.5 mm Number of attempts: 1 Airway Equipment and Method: Stylet Placement Confirmation: ETT inserted through vocal cords under direct vision,  positive ETCO2 and breath sounds checked- equal and bilateral Secured at: 22 cm Tube secured with: Tape Dental Injury: Teeth and Oropharynx as per pre-operative assessment

## 2016-04-25 NOTE — Progress Notes (Signed)
Pt very wild waking up,kicking legs off bed,2 of dilaudid given IV,ketamine given, them pt desating , 2 nasal trumpets in place, pt remains on 10liters with a simple mask

## 2016-04-25 NOTE — Progress Notes (Signed)
Dr Tobias Alexander at bedside, BP low Albumin gioven, he hyperventilated pt, oral airway in place, 1745 Narcan 8mcg given, 1750 Narcan 40given per Phineas Semen RN, Gas drawn, Dr Tobias Alexander aware, [phone], pt spit oral airway out, sats 10, BP 112,

## 2016-04-25 NOTE — H&P (Signed)
[name] [name] is an 70 y.o. female.   Chief Complaint: Severe back pain HPI: Patient is a 70 year old female with severe lumbar sacral pain worsened with any physical activity. Patient's pain is constant and debilitating. She is failed all efforts at conservative management. She has some intermittent symptoms of neurogenic claudication but most of her symptoms are referable to back pain itself. Workup demonstrates evidence of extreme disc degeneration at L1-2, L2-3, L3-4, L4-5 with degenerative spondylolisthesis at L5-S1. Patient presents now for anterior lateral retroperitoneal decompression infusion at L1-2, L2-3, L3-4 and L4-5 followed by posterior lumbar interbody fusion at L5-S1 followed by posterior lateral arthrodesis from T9-S1 using segmental pedicle screw instrumentation. Risks and benefits of been explained. Patient wishes to proceed.  Past Medical History:  Diagnosis Date  . Alopecia   . Anxiety   . Arthritis    "spine" (07/28/2013)  . Breast cancer ([place]) 1997; 2014   right  . CAP (community acquired pneumonia)    admission 06-04-2013 secondary to failed oral antibitotics---  last cxr 06-16-2013  much improved  . Chronic back pain    "lower back and upper neck" (07/28/2013)  . CLL (chronic lymphocytic leukemia) ([place]) dx'd 05/2013   "I don't think I have it though" (07/28/2013)  . DDD (degenerative disc disease)   . Deafness in left ear   . 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 ear   . Depression   . Diverticulosis   . Ecchymosis    FROM IV AND LAB WORK THE PAST WEEK  06-17-2013  . Ejection fraction   . Fibromyalgia 1978  . GERD (gastroesophageal reflux disease)   . Hematuria   . History of syncope    episode 2008--  no recurrence since  . HLD (hyperlipidemia)   . Hypothyroidism   . Kidney stones 2014  . Leukocytosis   . Orthostatic hypotension    slight in the past  . Plantar fasciitis   . Ruptured disk    one in neck and two in back  . S/P chemotherapy, time since greater than 12  weeks   . Sensation of pressure in bladder area   . Sinus tachycardia ([facility])    mild resting  . SOB (shortness of breath) 06-17-2013 CURRENTLY RESIDUAL FROM RECENT CAP   new since sore throats- 12/2014    . Stool incontinence    "at times recently"   . Ureteral calculi    bilateral  . Urgency of urination   . Wears glasses     Past Surgical History:  Procedure Laterality Date  . ABDOMINAL HYSTERECTOMY    . APPENDECTOMY  1997  . BREAST BIOPSY Right 2014  . BREAST LUMPECTOMY WITH AXILLARY LYMPH NODE DISSECTION Right 1997  . CYSTOSCOPY WITH RETROGRADE PYELOGRAM, URETEROSCOPY AND STENT PLACEMENT Bilateral 06/17/2013   Procedure: CYSTOSCOPY WITH RETROGRADE PYELOGRAM, URETEROSCOPY AND LEFT DOUBLE  J STENT PLACEMENT RIGHT URETERAL HOLMIIUM LASER AND DOUBLE J STENT ;  Surgeon: Hanley Ben, MD;  Location: [facility];  Service: Urology;  Laterality: Bilateral;  . CYSTOSCOPY WITH URETEROSCOPY, STONE BASKETRY AND STENT PLACEMENT Bilateral 2014  . HOLMIUM LASER APPLICATION Bilateral [id number]   Procedure: HOLMIUM LASER APPLICATION;  Surgeon: Hanley Ben, MD;  Location: [facility];  Service: Urology;  Laterality: Bilateral;  . LITHOTRIPSY Left ~ 06/2013  . LUMBAR EPIDURAL INJECTION     has had 7 injections  . MASTECTOMY COMPLETE / SIMPLE Right 07/28/2013  . MASTECTOMY W/ NODES PARTIAL Right 1997  . PORT-A-CATH REMOVAL Left 10/03/2014  Procedure: REMOVAL PORT-A-CATH;  Surgeon: Donnie Mesa, MD;  Location: [facility];  Service: General;  Laterality: Left;  . PORTACATH PLACEMENT Left 07/28/2013   Procedure: ATTEMPTED INSERTION PORT-A-CATH;  Surgeon: Imogene Burn. Georgette Dover, MD;  Location: [facility];  Service: General;  Laterality: Left;  . RETINAL DETACHMENT SURGERY Right 2013  . ROBOTIC ASSITED PARTIAL NEPHRECTOMY Right 02/05/2015   Procedure: ROBOTIC ASSITED PARTIAL NEPHRECTOMY;  Surgeon: Raynelle Bring, MD;  Location: [facility];  Service: Urology;   Laterality: Right;  . SIMPLE MASTECTOMY WITH AXILLARY SENTINEL NODE BIOPSY Right 07/28/2013   Procedure: RIGHT TOTAL  MASTECTOMY;  Surgeon: Imogene Burn. Georgette Dover, MD;  Location: [facility];  Service: General;  Laterality: Right;  . TONSILLECTOMY  AGE 34  . TOTAL ABDOMINAL HYSTERECTOMY W/ BILATERAL SALPINGOOPHORECTOMY  1997  . TRANSTHORACIC ECHOCARDIOGRAM  05-11-2007  dr Ron Parker   normal lvf/  ef 65%    Family History  Problem Relation Age of Onset  . Heart disease Maternal Grandfather   . Diabetes Maternal Grandfather   . Colon cancer Maternal Aunt 79  . Brain cancer Paternal Grandmother     dx in 44s  . Dementia Mother   . Diabetes Mother   . Osteoporosis Mother   . Diabetes Maternal Aunt   . Prostate cancer Father 23  . Bipolar disorder Maternal Aunt   . Stroke Maternal Aunt   . Stomach cancer Paternal Uncle     dx in late 34s   Social History:  reports that she has never smoked. She has never used smokeless tobacco. She reports that she does not drink alcohol or use drugs.  Allergies:  Allergies  Allergen Reactions  . Lasix [Furosemide] Nausea And Vomiting and Other (See Comments)    HEADACHE  . Lithium Other (See Comments)    Dizziness, "cause me to fall"  . Sulfa Antibiotics Hives  . Trazodone And Nefazodone Other (See Comments)    insomnia  . Lyrica [Pregabalin] Diarrhea, Nausea Only and Rash    Medications Prior to Admission  Medication Sig Dispense Refill  . ALPRAZolam (XANAX) 0.5 MG tablet TAKE 1 TABLET EVERY 8 HOURS AS NEEDED (Patient taking differently: TAKE 1 TABLET BY MOUTH EVERY TWICE DAILY.) 30 tablet 0  . amitriptyline (ELAVIL) 150 MG tablet Take 150 mg by mouth at bedtime.    Marland Kitchen anastrozole (ARIMIDEX) 1 MG tablet TAKE 1 TABLET (1 MG TOTAL) BY MOUTH DAILY. 90 tablet 1  . Apoaequorin (PREVAGEN) 10 MG CAPS Take 50 mg by mouth daily.    . Ascorbic Acid (VITAMIN C) 1000 MG tablet Take 1,000 mg by mouth daily.    . Cholecalciferol (VITAMIN D-3 PO) Take 1 capsule by mouth  daily. Reported on 09/25/2015    . gabapentin (NEURONTIN) 300 MG capsule TK 1 C PO QHS  4  . levothyroxine (SYNTHROID, LEVOTHROID) 50 MCG tablet Take 1 tablet (50 mcg total) by mouth daily before breakfast. 90 tablet 4  . minoxidil (ROGAINE) 2 % external solution Apply 1 application topically 2 (two) times daily as needed (hair.).     Marland Kitchen Multiple Vitamins-Minerals (HAIR/SKIN/NAILS PO) Take 1 tablet by mouth daily.    . Omega-3 Fatty Acids (FISH OIL) 1200 MG CAPS Take 1,200 mg by mouth daily.    Marland Kitchen omeprazole (PRILOSEC) 40 MG capsule Take 1 capsule (40 mg total) by mouth every morning. 90 capsule 1  . REXULTI 2 MG TABS Take 2 mg by mouth at bedtime.   0  . VIIBRYD 40 MG TABS Take 40  mg by mouth daily.   1  . vitamin E 100 UNIT capsule Take 100 Units by mouth daily.    Marland Kitchen aspirin 81 MG tablet Take 81 mg by mouth daily.    . Cyanocobalamin 2500 MCG CHEW Chew 2,500 mcg by mouth daily.      No results found for this or any previous visit (from the past 48 hour(s)). No results found.  Pertinent items noted in HPI and remainder of comprehensive ROS otherwise negative.  Blood pressure (!) 125/56, pulse 88, temperature 98.1 F (36.7 C), resp. rate 20, height 5' 3.5" (1.613 m), weight 87.1 kg (192 lb), last menstrual period 08/26/1995, SpO2 97 %.  Patient is awake and alert. She is oriented and appropriate. She small framed and somewhat heavyset. Examination head ears eyes and throat is unremarkable. Chest and abdomen are benign. Extremities are free from injury deformity. Examination of the patient's neurologic function finds her to be awake and alert. She is oriented and appropriate. Her speech is fluent. Judgment and insight are intact. Motor and sensory examination are intact bilaterally. Reflexes are hypoactive but symmetric. There is no evidence of long track signs. Gait is moderately antalgic with a flexed posture. Assessment/Plan L1-2, L2-3, L3-4, L4-5 degenerative disc disease with spondylosis and  stenosis. L5-S1 degenerative spondylolisthesis with stenosis. Plan L1-2, L2-3, L3-4, L4-5 anterior lateral retroperitoneal interbody decompression and fusion utilizing interbody peek cages and morselized allograft followed by L5-S1 posterior lumbar interbody fusion utilizing interbody expandable cages and local autograft, followed by posterior lateral arthrodesis utilizing segmental pedicle screw instrumentation from T9-S1. Risks metaphyseal been explained. Patient wishes to proceed.  Waylen Depaolo A 04/25/2016, 7:48 AM

## 2016-04-25 NOTE — Progress Notes (Signed)
Orthopedic Tech Progress Note Patient Details:  [name] [name] 1946-08-06 [id number]  Patient ID: [name], female   DOB: 03-Jan-1946, 70 y.o.   MRN: [id number]   [place] Pink 04/25/2016, 6:42 PMCalled [employer] for lumbar brace.

## 2016-04-26 LAB — CBC
HEMATOCRIT: 20.4 % — AB (ref 36.0–46.0)
HEMOGLOBIN: 5.9 g/dL — AB (ref 12.0–15.0)
MCH: 26.1 pg (ref 26.0–34.0)
MCHC: 28.9 g/dL — AB (ref 30.0–36.0)
MCV: 90.3 fL (ref 78.0–100.0)
Platelets: 179 10*3/uL (ref 150–400)
RBC: 2.26 MIL/uL — ABNORMAL LOW (ref 3.87–5.11)
RDW: 16.3 % — ABNORMAL HIGH (ref 11.5–15.5)
WBC: 65.1 10*3/uL (ref 4.0–10.5)

## 2016-04-26 LAB — BASIC METABOLIC PANEL
ANION GAP: 6 (ref 5–15)
BUN: 21 mg/dL — AB (ref 6–20)
CHLORIDE: 115 mmol/L — AB (ref 101–111)
CO2: 17 mmol/L — AB (ref 22–32)
Calcium: 6.9 mg/dL — ABNORMAL LOW (ref 8.9–10.3)
Creatinine, Ser: 1.61 mg/dL — ABNORMAL HIGH (ref 0.44–1.00)
GFR calc Af Amer: 36 mL/min — ABNORMAL LOW (ref >60)
GFR calc non Af Amer: 31 mL/min — ABNORMAL LOW (ref >60)
GLUCOSE: 127 mg/dL — AB (ref 65–99)
POTASSIUM: 4.8 mmol/L (ref 3.5–5.1)
Sodium: 138 mmol/L (ref 135–145)

## 2016-04-26 LAB — PREPARE RBC (CROSSMATCH)

## 2016-04-26 MED ORDER — SODIUM CHLORIDE 0.9 % IV SOLN: Freq: Once | INTRAVENOUS | Status: DC

## 2016-04-26 MED ORDER — SODIUM CHLORIDE 0.9 % IV BOLUS (SEPSIS)
500.0000 mL | Freq: Once | INTRAVENOUS | Status: AC
  Administered 2016-04-26: 500 mL via INTRAVENOUS

## 2016-04-26 MED ORDER — VILAZODONE HCL 40 MG PO TABS
40.0000 mg | ORAL_TABLET | Freq: Every day | ORAL | Status: DC
  Administered 2016-04-27 – 2016-04-28 (×2): 40 mg via ORAL
  Filled 2016-04-26 (×4): qty 1

## 2016-04-26 NOTE — Evaluation (Signed)
Physical Therapy Evaluation Patient Details Name: [name] [name] MRN: [id number] DOB: [birth year] Today's Date: 04/26/2016   History of Present Illness  patient is a 70 yo female s/p LEFT LUMBAR ONE-TWO, LUMBAR TWO-THREE, LUMBAR THREE-FOUR, LUMBAR FOUR-FIVE ANTERIOR LATERAL LUMBAR FUSION (Left).  Clinical Impression  Patient seen for evaluation s/p spinal surgery. at this time, patient assessment significantly limited to bed level as patient with extreme pain, low BP, and decreased activity tolerance. performed LE strength assessmend and bi-directional rolling, however could not come to upright at EOB at this time. Nsg in room during session. Patient required +2 assist for rolling. Attempted to educated [id number] precautions, however, unsure retention at this time. Patient will need continued skilled PT to address deficits and maximize function. Will see as indicated and progress as tolerated. Will likely need post acute rehabilitation.    Follow Up Recommendations SNF;Supervision/Assistance - 24 hour    Equipment Recommendations  3in1 (PT)    Recommendations for Other Services       Precautions / Restrictions Precautions Precautions: Back Precaution Booklet Issued: No Precaution Comments: verbally educated patient, will provide handout next session when patient more Required Braces or Orthoses: Spinal Brace Spinal Brace: Lumbar corset Restrictions Weight Bearing Restrictions: No      Mobility  Bed Mobility Overal bed mobility: Needs Assistance;+2 for physical assistance;+ 2 for safety/equipment Bed Mobility: Rolling Rolling: Max assist;+2 for physical assistance (both directions)         General bed mobility comments: Max assist for bed mobility, could not attempt to come to sitting due to extreme pain at this time, nsg in room to assist  Transfers                 General transfer comment: unable to perform  Ambulation/Gait                Stairs             Wheelchair Mobility    Modified Rankin (Stroke Patients Only)       Balance Overall balance assessment:  (unable to assess at this time)                                           Pertinent Vitals/Pain Pain Assessment: Faces Faces Pain Scale: Hurts whole lot Pain Location: back and "all over" Pain Descriptors / Indicators: Aching;Discomfort;Grimacing;Guarding;Moaning Pain Intervention(s): Limited activity within patient's tolerance;Monitored during session;Repositioned;RN gave pain meds during session;Relaxation    Home Living Family/patient expects to be discharged to:: Private residence Living Arrangements: Spouse/significant other;Children Available Help at Discharge: Family;Available PRN/intermittently Type of Home: House Home Access: Stairs to enter Entrance Stairs-Rails: None Entrance Stairs-Number of Steps: 3 Home Layout: One level Home Equipment: Walker - 2 wheels;Cane - single point      Prior Function Level of Independence: Independent         Comments: states she does not use any device     Hand Dominance   Dominant Hand: Right    Extremity/Trunk Assessment   Upper Extremity Assessment: Generalized weakness           Lower Extremity Assessment: Generalized weakness;RLE deficits/detail;LLE deficits/detail      Cervical / Trunk Assessment:  (s/p extensive spinal surgery)  Communication   Communication: HOH  Cognition Arousal/Alertness: Awake/alert Behavior During Therapy: Restless Overall Cognitive Status: No family/caregiver present to determine baseline cognitive functioning  General Comments General comments (skin integrity, edema, etc.): patient seen for evaluation s/p spinal surgery. at this time, patient assessment significantly limited to bed level as patient with extreme pain, low BP, and decreased activity tolerance. performed LE strength assessmend and bi-directional rolling,  however could not come to upright at EOB at this time. Nsg in room during session. Patient required +2 assist for rolling. Attempted to educated [id number] precautions, however, unsure retention at this time. Patient will need continued skilled PT to address deficits and maximize function. Will see as indicated and progress as tolerated. Will likely need post acute rehabilitation.    Exercises        Assessment/Plan    PT Assessment Patient needs continued PT services  PT Diagnosis Difficulty walking;Abnormality of gait;Generalized weakness;Acute pain   PT Problem List Decreased strength;Decreased activity tolerance;Decreased balance;Decreased mobility;Decreased coordination;Decreased safety awareness;Cardiopulmonary status limiting activity;Pain  PT Treatment Interventions DME instruction;Gait training;Stair training;Functional mobility training;Therapeutic activities;Therapeutic exercise;Balance training;Patient/family education   PT Goals (Current goals can be found in the Care Plan section) Acute Rehab PT Goals Patient Stated Goal: to get rid of this pain PT Goal Formulation: With patient Time For Goal Achievement: 05/10/16 Potential to Achieve Goals: Good    Frequency Min 5X/week   Barriers to discharge Decreased caregiver support husband works    Co-evaluation               End of Session Equipment Utilized During Treatment: Oxygen Activity Tolerance: Patient limited by pain Patient left: in bed;with call bell/phone within reach;with nursing/sitter in room Nurse Communication: Mobility status;Precautions         Time: [id number] PT Time Calculation (min) (ACUTE ONLY): 24 min   Charges:   PT Evaluation $PT Eval Moderate Complexity: 1 Procedure PT Treatments $Therapeutic Activity: 8-22 mins   PT G CodesDuncan Dull 19-May-2016, 9:04 AM Alben Deeds, PT DPT  [phone]

## 2016-04-26 NOTE — Progress Notes (Signed)
Subjective: Patient reports very uncomfortable  Objective: Vital signs in last 24 hours: Temp:  [97.5 F (36.4 C)-99.1 F (37.3 C)] 99.1 F (37.3 C) (09/02 0800) Pulse Rate:  [102-124] 116 (09/02 0800) Resp:  [8-26] 11 (09/02 0800) BP: (91-128)/(45-92) 91/45 (09/02 0800) SpO2:  [91 %-100 %] 100 % (09/02 0800) Arterial Line BP: (52-138)/(25-123) 88/71 (09/02 0800) Weight:  [87.1 kg (192 lb 0.3 oz)] 87.1 kg (192 lb 0.3 oz) (09/01 1800)  Intake/Output from previous day: 09/01 0701 - 09/02 0700 In: 5370 [I.V.:4700; Blood:120; IV Piggyback:550] Out: [id number] [Urine:620; Drains:275; Blood:500] Intake/Output this shift: Total I/O In: 100 [I.V.:100] Out: -   Physical Exam: Strength full.   Dressings CDI.  Uncomfortable with movement.  Lab Results: No results for input(s): WBC, HGB, HCT, PLT in the last 72 hours. BMET No results for input(s): NA, K, CL, CO2, GLUCOSE, BUN, CREATININE, CALCIUM in the last 72 hours.  Studies/Results: Dg Thoracolumabar Spine  Result Date: 04/25/2016 CLINICAL DATA:  Multilevel lumbar laminectomies, PLIF. EXAM: THORACOLUMBAR SPINE 1V COMPARISON:  None. FLUOROSCOPY TIME:  5 minutes and 23 seconds. FINDINGS: Five fluoroscopic spot views of the thoracolumbar spine submitted labeled 4 through 8. Interpreting radiologist was not present at time of image acquisition. Lower thoracic through S1 pedicle screws and bridging rods. L5-S1 disc prosthesis. Multilevel additional lumbar interbody fusion material. IMPRESSION: Intraoperative fluoroscopic spot views demonstrating thoracolumbar fusion. Electronically Signed   By: Elon Alas M.D.   On: 04/25/2016 15:47   Dg C-arm Gt 120 Min  Result Date: 04/25/2016 CLINICAL DATA:  Multilevel lumbar laminectomies, PLIF. EXAM: THORACOLUMBAR SPINE 1V COMPARISON:  None. FLUOROSCOPY TIME:  5 minutes and 23 seconds. FINDINGS: Five fluoroscopic spot views of the thoracolumbar spine submitted labeled 4 through 8. Interpreting  radiologist was not present at time of image acquisition. Lower thoracic through S1 pedicle screws and bridging rods. L5-S1 disc prosthesis. Multilevel additional lumbar interbody fusion material. IMPRESSION: Intraoperative fluoroscopic spot views demonstrating thoracolumbar fusion. Electronically Signed   By: Elon Alas M.D.   On: 04/25/2016 15:47    Assessment/Plan: Has been hypotensive.  Will give fluid bolus and hopefully better pain management when BP better.  210 out of drain, will check CBC and consider transfusion if Hgb low.    LOS: 1 day    Peggyann Shoals, MD 04/26/2016, 9:11 AM

## 2016-04-27 LAB — TYPE AND SCREEN
ABO/RH(D): O POS
ANTIBODY SCREEN: NEGATIVE
UNIT DIVISION: 0
Unit division: 0

## 2016-04-27 LAB — CBC WITH DIFFERENTIAL/PLATELET
BASOS PCT: 0 %
Basophils Absolute: 0 10*3/uL (ref 0.0–0.1)
EOS PCT: 0 %
Eosinophils Absolute: 0 10*3/uL (ref 0.0–0.7)
HEMATOCRIT: 25.4 % — AB (ref 36.0–46.0)
Hemoglobin: 8 g/dL — ABNORMAL LOW (ref 12.0–15.0)
LYMPHS ABS: 29.7 10*3/uL — AB (ref 0.7–4.0)
Lymphocytes Relative: 77 %
MCH: 27.7 pg (ref 26.0–34.0)
MCHC: 31.5 g/dL (ref 30.0–36.0)
MCV: 87.9 fL (ref 78.0–100.0)
MONOS PCT: 6 %
Monocytes Absolute: 2.3 10*3/uL — ABNORMAL HIGH (ref 0.1–1.0)
NEUTROS ABS: 6.5 10*3/uL (ref 1.7–7.7)
Neutrophils Relative %: 17 %
Platelets: 102 10*3/uL — ABNORMAL LOW (ref 150–400)
RBC: 2.89 MIL/uL — AB (ref 3.87–5.11)
RDW: 15.6 % — AB (ref 11.5–15.5)
WBC: 38.5 10*3/uL — AB (ref 4.0–10.5)

## 2016-04-27 NOTE — Progress Notes (Signed)
No acute events Vital signs improved Pretty sedated Moving legs well Stable Keep drain for at least one more day

## 2016-04-28 LAB — POCT I-STAT 7, (LYTES, BLD GAS, ICA,H+H)
Acid-base deficit: 4 mmol/L — ABNORMAL HIGH (ref 0.0–2.0)
Bicarbonate: 21.6 mmol/L (ref 20.0–28.0)
Calcium, Ion: 1.2 mmol/L (ref 1.15–1.40)
HEMATOCRIT: 29 % — AB (ref 36.0–46.0)
Hemoglobin: 9.9 g/dL — ABNORMAL LOW (ref 12.0–15.0)
O2 Saturation: 99 %
PCO2 ART: 43.2 mmHg (ref 32.0–48.0)
POTASSIUM: 5.1 mmol/L (ref 3.5–5.1)
Sodium: 138 mmol/L (ref 135–145)
TCO2: 23 mmol/L (ref 0–100)
pH, Arterial: 7.307 — ABNORMAL LOW (ref 7.350–7.450)
pO2, Arterial: 137 mmHg — ABNORMAL HIGH (ref 83.0–108.0)

## 2016-04-28 LAB — POCT I-STAT 4, (NA,K, GLUC, HGB,HCT)
GLUCOSE: 148 mg/dL — AB (ref 65–99)
HEMATOCRIT: 25 % — AB (ref 36.0–46.0)
Hemoglobin: 8.5 g/dL — ABNORMAL LOW (ref 12.0–15.0)
Potassium: 4.7 mmol/L (ref 3.5–5.1)
SODIUM: 141 mmol/L (ref 135–145)

## 2016-04-28 MED ORDER — HYDROCODONE-ACETAMINOPHEN 7.5-325 MG PO TABS
1.0000 | ORAL_TABLET | Freq: Four times a day (QID) | ORAL | Status: DC | PRN
  Administered 2016-04-29: 1 via ORAL
  Administered 2016-04-29 – 2016-04-30 (×3): 2 via ORAL
  Administered 2016-04-30: 1 via ORAL
  Administered 2016-05-01 (×3): 2 via ORAL
  Administered 2016-05-02: 1 via ORAL
  Filled 2016-04-28 (×7): qty 2
  Filled 2016-04-28 (×2): qty 1

## 2016-04-28 NOTE — Progress Notes (Signed)
Patient a bit drowsy and disoriented to place and situation, but aware of birthdate, name, family member names..  Per MAR, patient recently had percocet. Will continue to monitor. Bed alarm on

## 2016-04-28 NOTE — Progress Notes (Signed)
No acute events AVSS Moving legs well D/c drain TTF

## 2016-04-28 NOTE — Evaluation (Signed)
Occupational Therapy Evaluation Patient Details Name: [name] [name] MRN: [id number] DOB: Oct 06, 1945 Today's Date: 04/28/2016    History of Present Illness patient is a 70 yo female s/p LEFT LUMBAR ONE-TWO, LUMBAR TWO-THREE, LUMBAR THREE-FOUR, LUMBAR FOUR-FIVE ANTERIOR LATERAL LUMBAR FUSION (Left).   Clinical Impression   This 70 yo female admitted with above presents to acute OT with deficits below (see OT problem list) thus affecting her PLOF of Independent. She will benefit from acute OT with followup  OT at SNF to get back to an independent to Mod I level.    Follow Up Recommendations  SNF;Supervision/Assistance - 24 hour    Equipment Recommendations  3 in 1 bedside comode       Precautions / Restrictions Precautions Precautions: Back Precaution Booklet Issued: No Precaution Comments: verbally educated patient, will provide handout next session when patient more alert and able to retain information Required Braces or Orthoses: Spinal Brace Spinal Brace: Lumbar corset;Applied in sitting position Restrictions Weight Bearing Restrictions: No      Mobility Bed Mobility Overal bed mobility: Needs Assistance;+2 for physical assistance;+ 2 for safety/equipment Bed Mobility: Supine to Sit Rolling: Max assist;+2 for physical assistance;+2 for safety/equipment         General bed mobility comments: Max assist via helicopter technique   Transfers Overall transfer level: Needs assistance Equipment used: 2 person hand held assist Transfers: Sit to/from [facility] Sit to Stand: Max assist;+2 physical assistance Stand pivot transfers: Max assist;+2 physical assistance       General transfer comment: VCs for hand placement and positioning, assist to power up to standing. Assist to pivot to chair, manual facilitation of weight shift and positioning of hips over surface. Increased tactile assist for hand placement and encouragement to sit    Balance Overall  balance assessment: Needs assistance Sitting-balance support: Bilateral upper extremity supported;Feet supported Sitting balance-Leahy Scale: Poor Sitting balance - Comments: required assist to maintain EOB Postural control: Posterior lean Standing balance support: Bilateral upper extremity supported Standing balance-Leahy Scale: Poor Standing balance comment: reliance on UE for support                            ADL Overall ADL's : Needs assistance/impaired Eating/Feeding: Set up;Sitting (supported in recliner)   Grooming: Moderate assistance;Sitting (supported in recliner)   Upper Body Bathing: Moderate assistance;Sitting (supported in recliner)   Lower Body Bathing: Maximal assistance (max A +2 sit<>stand)   Upper Body Dressing : Maximal assistance;Sitting (supported in recliner)   Lower Body Dressing: Total assistance (Max A +2 sit<>stand)   Toilet Transfer: +2 for physical assistance;Stand-pivot;BSC;Maximal assistance   Toileting- Clothing Manipulation and Hygiene: Total assistance (+1 Max A standing)                         Pertinent Vitals/Pain Pain Assessment: 0-10 Faces Pain Scale: Hurts whole lot Pain Location: back Pain Descriptors / Indicators: Aching;Discomfort;Grimacing;Guarding;Moaning Pain Intervention(s): Limited activity within patient's tolerance;Monitored during session;Repositioned;RN gave pain meds during session     Hand Dominance Right   Extremity/Trunk Assessment Upper Extremity Assessment Upper Extremity Assessment: Generalized weakness;LUE deficits/detail LUE Deficits / Details: having difficulty with LUE at elbow with ROM due to IV and arm slightly edematous LUE Coordination: decreased gross motor   Lower Extremity Assessment RLE: Unable to fully assess due to pain RLE Sensation: decreased light touch;history of peripheral neuropathy LLE: Unable to fully assess due to pain LLE Sensation: decreased  light touch;history of  peripheral neuropathy   Cervical / Trunk Assessment Cervical / Trunk Assessment:  (s/p extensive spinal surgery)   Communication Communication Communication: HOH   Cognition Arousal/Alertness: Awake/alert Behavior During Therapy: Restless Overall Cognitive Status: No family/caregiver present to determine baseline cognitive functioning                                Home Living Family/patient expects to be discharged to:: Skilled nursing facility Living Arrangements: Spouse/significant other;Children Available Help at Discharge: Family;Available PRN/intermittently Type of Home: House Home Access: Stairs to enter [employer] of Steps: 3 Entrance Stairs-Rails: None Home Layout: One level     Bathroom Shower/Tub: Occupational psychologist: Standard     Home Equipment: Environmental consultant - 2 wheels;Cane - single point          Prior Functioning/Environment Level of Independence: Independent        Comments: states she does not use any device    OT Diagnosis: Generalized weakness;Acute pain   OT Problem List: Decreased strength;Decreased range of motion;Decreased activity tolerance;Impaired balance (sitting and/or standing);Decreased safety awareness;Decreased knowledge of use of DME or AE;Decreased knowledge of precautions;Pain;Obesity;Impaired UE functional use   OT Treatment/Interventions: Self-care/ADL training;Balance training;Therapeutic activities;DME and/or AE instruction;Patient/family education    OT Goals(Current goals can be found in the care plan section) Acute Rehab OT Goals Patient Stated Goal: to get rid of this pain OT Goal Formulation: With patient Time For Goal Achievement: 05/12/16 Potential to Achieve Goals: Good  OT Frequency: Min 2X/week   Barriers to D/C:            Co-evaluation PT/OT/SLP Co-Evaluation/Treatment: Yes Reason for Co-Treatment: Complexity of the patient's impairments (multi-system involvement);For  patient/therapist safety PT goals addressed during session: Mobility/safety with mobility OT goals addressed during session: ADL's and self-care;Strengthening/ROM      End of Session Equipment Utilized During Treatment: Back brace Nurse Communication: Mobility status  Activity Tolerance: Patient limited by pain Patient left: in chair;with call bell/phone within reach   Time: [id number] OT Time Calculation (min): 31 min Charges:  OT General Charges $OT Visit: 1 Procedure OT Evaluation $OT Eval Moderate Complexity: 1 Procedure  Almon Register [id number] 04/28/2016, 10:35 AM

## 2016-04-28 NOTE — Progress Notes (Signed)
Paged MD on call (dr Ditty) because of patient's altered orientation state. Current assessment has patient less oriented than previous documentation. Asked MD if this orientation has been par for the course for this hospital stay, and MD confirmed that patient was also disoriented this morning, on and off. MD did adjust some pain medications, which I ordered. Patient resting comfortably in bed, bed alarm on.

## 2016-04-28 NOTE — Progress Notes (Addendum)
Physical Therapy Treatment Patient Details Name: [name] [name] MRN: [id number] DOB: Oct 18, 1945 Today's Date: 04/28/2016    History of Present Illness patient is a 70 yo female s/p LEFT LUMBAR ONE-TWO, LUMBAR TWO-THREE, LUMBAR THREE-FOUR, LUMBAR FOUR-FIVE ANTERIOR LATERAL LUMBAR FUSION (Left).    PT Comments    Patient seen for mobility progression, tolerated OOB to [facility] then to chair with +2 increased physical assist. Patient with increased anxiety during movement. Tolerated EOB >10 mins prior to transition.  Follow Up Recommendations  SNF;Supervision/Assistance - 24 hour     Equipment Recommendations  3in1 (PT)    Recommendations for Other Services       Precautions / Restrictions Precautions Precautions: Back Precaution Booklet Issued: No Precaution Comments: verbally educated patient, will provide handout next session when patient more Required Braces or Orthoses: Spinal Brace Spinal Brace: Lumbar corset Restrictions Weight Bearing Restrictions: No    Mobility  Bed Mobility Overal bed mobility: Needs Assistance;+2 for physical assistance;+ 2 for safety/equipment Bed Mobility: Supine to Sit Rolling: Max assist;+2 for physical assistance;+2 for safety/equipment         General bed mobility comments: Max assist via helicopter technique   Transfers Overall transfer level: Needs assistance Equipment used: 2 person hand held assist Transfers: Sit to/from [facility] Sit to Stand: Max assist;+2 physical assistance Stand pivot transfers: Max assist;+2 physical assistance       General transfer comment: VCs for hand placement and positioning, assist to power up to standing. Assist to pivot to chair, manual facilitation of weight shift and positioning of hips over surface. Increased tactile assist for hand placement and encouragement to sit  Ambulation/Gait         Gait velocity: not performed this session       Stairs             Wheelchair Mobility    Modified Rankin (Stroke Patients Only)       Balance Overall balance assessment: Needs assistance Sitting-balance support: Bilateral upper extremity supported;Feet supported Sitting balance-Leahy Scale: Poor Sitting balance - Comments: required assist to maintain EOB Postural control: Posterior lean Standing balance support: Bilateral upper extremity supported Standing balance-Leahy Scale: Poor Standing balance comment: reliance on UE support                    Cognition Arousal/Alertness: Awake/alert Behavior During Therapy: Restless Overall Cognitive Status: No family/caregiver present to determine baseline cognitive functioning                      Exercises      General Comments        Pertinent Vitals/Pain Pain Assessment: Faces Faces Pain Scale: Hurts whole lot Pain Location: back Pain Descriptors / Indicators: Aching;Discomfort;Grimacing;Guarding;Moaning Pain Intervention(s): Limited activity within patient's tolerance;Monitored during session;Repositioned    Home Living Family/patient expects to be discharged to:: Private residence Living Arrangements: Spouse/significant other;Children Available Help at Discharge: Family;Available PRN/intermittently Type of Home: House Home Access: Stairs to enter Entrance Stairs-Rails: None Home Layout: One level Home Equipment: Environmental consultant - 2 wheels;Cane - single point      Prior Function Level of Independence: Independent      Comments: states she does not use any device   PT Goals (current goals can now be found in the care plan section) Acute Rehab PT Goals Patient Stated Goal: to get rid of this pain PT Goal Formulation: With patient Time For Goal Achievement: 05/10/16 Potential to Achieve Goals: Good Progress towards PT goals:  Progressing toward goals    Frequency  Min 5X/week    PT Plan Current plan remains appropriate    Co-evaluation PT/OT/SLP  Co-Evaluation/Treatment: Yes Reason for Co-Treatment: Complexity of the patient's impairments (multi-system involvement);For patient/therapist safety PT goals addressed during session: Mobility/safety with mobility       End of Session Equipment Utilized During Treatment: Back brace Activity Tolerance: Patient limited by pain Patient left: in bed;with call bell/phone within reach;with nursing/sitter in room     Time: [id number] PT Time Calculation (min) (ACUTE ONLY): 31 min  Charges:  $Therapeutic Activity: 8-22 mins                    G CodesDuncan Dull 05/04/2016, 10:23 AM Alben Deeds, PT DPT [phone]

## 2016-04-28 NOTE — Progress Notes (Signed)
Patient transferred from unit 39M to room 5c06 at this time. Alert and in stable condition.

## 2016-04-29 ENCOUNTER — Encounter (HOSPITAL_COMMUNITY): Payer: Self-pay | Admitting: Neurosurgery

## 2016-04-29 LAB — POCT I-STAT 3, ART BLOOD GAS (G3+)
ACID-BASE DEFICIT: 10 mmol/L — AB (ref 0.0–2.0)
Bicarbonate: 19.4 mmol/L — ABNORMAL LOW (ref 20.0–28.0)
O2 SAT: 93 %
PCO2 ART: 65.3 mmHg — AB (ref 32.0–48.0)
Patient temperature: 98
TCO2: 21 mmol/L (ref 0–100)
pH, Arterial: 7.08 — CL (ref 7.350–7.450)
pO2, Arterial: 93 mmHg (ref 83.0–108.0)

## 2016-04-29 LAB — PATHOLOGIST SMEAR REVIEW

## 2016-04-29 MED ORDER — VILAZODONE HCL 20 MG PO TABS
40.0000 mg | ORAL_TABLET | Freq: Every day | ORAL | Status: DC
  Administered 2016-04-29 – 2016-05-02 (×4): 40 mg via ORAL
  Filled 2016-04-29 (×5): qty 2

## 2016-04-29 MED FILL — Sodium Chloride IV Soln 0.9%: INTRAVENOUS | Qty: 2000 | Status: AC

## 2016-04-29 MED FILL — Heparin Sodium (Porcine) Inj 1000 Unit/ML: INTRAMUSCULAR | Qty: 30 | Status: AC

## 2016-04-29 NOTE — Care Management Important Message (Signed)
Important Message  Patient Details  Name: [name] [name] MRN: [id number] Date of Birth: Oct 04, 1945   Medicare Important Message Given:  Yes    Jashon Ishida 04/29/2016, 10:16 AM

## 2016-04-29 NOTE — Care Management Note (Signed)
Case Management Note  Patient Details  Name: [name] [name] MRN: [id number] Date of Birth: April 11, 1946  Subjective/Objective:  Pt underwent:   LEFT LUMBAR ONE-TWO, LUMBAR TWO-THREE, LUMBAR THREE-FOUR, LUMBAR FOUR-FIVE ANTERIOR LATERAL LUMBAR FUSION (Left) LUMBAR FIVE-SACRAL ONE POSTERIOR LUMBAR INTERBODY FUSION, THORACIC NINE-SACRAL ONE POSTERIOR LATERAL ARTHRODESIS WITH PEDICLE SCREWS. She is from home with her spouse.                  Action/Plan: PT/OT recommendations are for SNF. CM following for d/c disposition.   Expected Discharge Date:                  Expected Discharge Plan:  Skilled Nursing Facility  In-House Referral:  Clinical Social Work  Discharge planning Services  CM Consult  Post Acute Care Choice:    Choice offered to:     DME Arranged:    DME Agency:     HH Arranged:    [place] Agency:     Status of Service:  In process, will continue to follow  If discussed at Long Length of Stay Meetings, dates discussed:    Additional Comments:  Pollie Friar, RN 04/29/2016, 1:50 PM

## 2016-04-29 NOTE — Anesthesia Postprocedure Evaluation (Signed)
Anesthesia Post Note  Patient: [name] [name]  Procedure(s) Performed: Procedure(s) (LRB): LEFT LUMBAR ONE-TWO, LUMBAR TWO-THREE, LUMBAR THREE-FOUR, LUMBAR FOUR-FIVE ANTERIOR LATERAL LUMBAR FUSION (Left) LUMBAR FIVE-SACRAL ONE POSTERIOR LUMBAR INTERBODY FUSION, THORACIC NINE-SACRAL ONE POSTERIOR LATERAL ARTHRODESIS WITH PEDICLE SCREWS (N/A)  Patient location during evaluation: PACU Anesthesia Type: General Level of consciousness: awake and alert Pain management: pain level controlled Vital Signs Assessment: post-procedure vital signs reviewed and stable Respiratory status: spontaneous breathing, nonlabored ventilation, respiratory function stable and patient connected to nasal cannula oxygen Cardiovascular status: blood pressure returned to baseline and stable Postop Assessment: no signs of nausea or vomiting Anesthetic complications: no    Last Vitals:  Vitals:   04/29/16 1508 04/29/16 1720  BP: (!) 113/50 (!) 127/53  Pulse: 92 99  Resp: 18 20  Temp: 37.1 C 37.2 C    Last Pain:  Vitals:   04/29/16 1720  TempSrc: Oral  PainSc:                  Brenee Gajda S

## 2016-04-29 NOTE — Progress Notes (Signed)
Progressing slowly.  C/o's of back pain primarily.  Making some progress with pt.  Up ambulating in hall with assist.  VSS AF  Awake and aware. Cn intact. Motor 5/5. drsg dry.  abd soft  Cont therapy and effort to mobilize.  Snf soon

## 2016-04-29 NOTE — Progress Notes (Signed)
Physical Therapy Treatment Patient Details Name: [name] [name] MRN: [id number] DOB: Jul 06, 1946 Today's Date: 04/29/2016    History of Present Illness patient is a 70 yo female s/p LEFT LUMBAR ONE-TWO, LUMBAR TWO-THREE, LUMBAR THREE-FOUR, LUMBAR FOUR-FIVE ANTERIOR LATERAL LUMBAR FUSION (Left).    PT Comments    Pt remains extremely lethargic limiting ability to participate in PT. Pt did amb 69' with RW with maxA to facilitate walker management. Pt remains appropriate for SNF upon d/c.  Follow Up Recommendations  SNF;Supervision/Assistance - 24 hour     Equipment Recommendations       Recommendations for Other Services       Precautions / Restrictions Precautions Precautions: Back Precaution Booklet Issued: No Precaution Comments: attempted to verbally educate however pt unable to retain due to lethargy Required Braces or Orthoses: Spinal Brace Spinal Brace: Lumbar corset;Applied in sitting position Restrictions Weight Bearing Restrictions: No    Mobility  Bed Mobility Overal bed mobility: Needs Assistance Bed Mobility: Rolling;Sidelying to Sit Rolling: Max assist Sidelying to sit: Mod assist       General bed mobility comments: max directional v/c's to open eyes and participate, assist to achieve sidelying and then trunk elevation  Transfers Overall transfer level: Needs assistance Equipment used: 2 person hand held assist Transfers: Sit to/from Stand Sit to Stand: Mod assist;+2 physical assistance         General transfer comment: max v/c's to participate due to lethargy, max tactile cues to facilitate  Ambulation/Gait Ambulation/Gait assistance: Mod assist;+2 safety/equipment Ambulation Distance (Feet): 50 Feet Assistive device: Rolling walker (2 wheeled) Gait Pattern/deviations: Step-through pattern;Decreased stride length;Shuffle Gait velocity: slow Gait velocity interpretation: Below normal speed for age/gender General Gait Details: pt with eyes  cloased, mod/maxA to maintain forward moementum with RW and keep eyes open, no episodes of LOB however pt unsteady with lateral sway as pt maintained eyes closed, modA for walker management, 2nd person for chair follow   Stairs            Wheelchair Mobility    Modified Rankin (Stroke Patients Only)       Balance Overall balance assessment: Needs assistance Sitting-balance support: Bilateral upper extremity supported Sitting balance-Leahy Scale: Poor Sitting balance - Comments: unable to maintain balance, R lateral lean Postural control: Posterior lean Standing balance support: Bilateral upper extremity supported Standing balance-Leahy Scale: Poor Standing balance comment: maintains eyes closed                    Cognition Arousal/Alertness: Lethargic Behavior During Therapy: Flat affect Overall Cognitive Status: Impaired/Different from baseline Area of Impairment: Orientation;Attention;Memory;Following commands;Safety/judgement;Awareness;Problem solving Orientation Level: Disoriented to;Time Current Attention Level: Focused (can not maintain eye opening) Memory: Decreased recall of precautions;Decreased short-term memory Following Commands: Follows one step commands inconsistently;Follows one step commands with increased time Safety/Judgement: Decreased awareness of safety;Decreased awareness of deficits Awareness: Intellectual Problem Solving: Slow processing;Decreased initiation;Difficulty sequencing;Requires verbal cues;Requires tactile cues General Comments: pt lethargic and unable to maintain eye opening to focus on treatement, max v/c's and sternal rub to open eyes, lasting only a brief second    Exercises      General Comments        Pertinent Vitals/Pain Pain Assessment: 0-10 Pain Score:  (unable to rate due to lethargy) Pain Location: back Pain Intervention(s): Monitored during session    Home Living                      Prior  Function  PT Goals (current goals can now be found in the care plan section) Progress towards PT goals: Progressing toward goals    Frequency  Min 5X/week    PT Plan Current plan remains appropriate    Co-evaluation             End of Session Equipment Utilized During Treatment: Gait belt;Back brace Activity Tolerance: Patient limited by lethargy Patient left: in chair;with call bell/phone within reach;with chair alarm set     Time: 0810-0830 PT Time Calculation (min) (ACUTE ONLY): 20 min  Charges:  $Gait Training: 8-22 mins                    G Codes:      [name] [name] 04/29/2016, 9:32 AM   [name] [name], PT, DPT Pager #: [phone] Office #: [phone]

## 2016-04-30 NOTE — Progress Notes (Signed)
Physical Therapy Treatment Patient Details Name: [name] [name] MRN: [id number] DOB: Aug 31, 1945 Today's Date: 04/30/2016    History of Present Illness patient is a 70 yo female s/p LEFT LUMBAR ONE-TWO, LUMBAR TWO-THREE, LUMBAR THREE-FOUR, LUMBAR FOUR-FIVE ANTERIOR LATERAL LUMBAR FUSION (Left).    PT Comments    Patient is progressing gradually toward mobility goals. Limited by pain and lethargy at times during session. Current plan remains appropriate.   Follow Up Recommendations  SNF;Supervision/Assistance - 24 hour     Equipment Recommendations  Other (comment) (TBA next venue)    Recommendations for Other Services       Precautions / Restrictions Precautions Precautions: Back Precaution Booklet Issued: No Precaution Comments: educated pt twice during session Required Braces or Orthoses: Spinal Brace Spinal Brace: Lumbar corset;Applied in sitting position Restrictions Weight Bearing Restrictions: No    Mobility  Bed Mobility Overal bed mobility: Needs Assistance Bed Mobility: Rolling;Sidelying to Sit;Sit to Sidelying Rolling: Mod assist Sidelying to sit: Mod assist     Sit to sidelying: Mod assist General bed mobility comments: assist at bilat LE and trunk to roll and to elevate trunk into sitting; max cues for sequencing and technique  Transfers Overall transfer level: Needs assistance Equipment used: Rolling walker (2 wheeled) Transfers: Sit to/from Stand Sit to Stand: Mod assist;Min assist         General transfer comment: mod A first trial from EOB and min A from [facility]; multimodal cues for safe hand placement  Ambulation/Gait Ambulation/Gait assistance: Min guard Ambulation Distance (Feet): 85 Feet Assistive device: Rolling walker (2 wheeled) Gait Pattern/deviations: Step-through pattern;Decreased stride length Gait velocity: slow   General Gait Details: cues for posture and increased bilat step length; pt closed eyes at times and needed cues to keep  them open; one brief standing rest break   Stairs            Wheelchair Mobility    Modified Rankin (Stroke Patients Only)       Balance   Sitting-balance support: Bilateral upper extremity supported;Feet supported Sitting balance-Leahy Scale: Fair     Standing balance support: Bilateral upper extremity supported Standing balance-Leahy Scale: Poor                      Cognition Arousal/Alertness: Awake/alert (still a little lethargic at times ) Behavior During Therapy: WFL for tasks assessed/performed Overall Cognitive Status: Impaired/Different from baseline Area of Impairment: Attention;Memory;Awareness;Problem solving   Current Attention Level: Sustained (can not maintain eye opening) Memory: Decreased recall of precautions;Decreased short-term memory Following Commands: Follows one step commands with increased time;Follows one step commands consistently   Awareness: Intellectual Problem Solving: Slow processing;Decreased initiation;Difficulty sequencing;Requires verbal cues General Comments: pt with eyes closed a lot beginning of session; needed education of back precuations 2 X during session and frequent cues to maintain    Exercises      General Comments        Pertinent Vitals/Pain Pain Assessment: Faces Faces Pain Scale: Hurts even more Pain Location: back and R side Pain Descriptors / Indicators: Sore;Cramping Pain Intervention(s): Limited activity within patient's tolerance;Monitored during session;Premedicated before session;Repositioned    Home Living                      Prior Function            PT Goals (current goals can now be found in the care plan section) Acute Rehab PT Goals Patient Stated Goal: get better Progress towards  PT goals: Progressing toward goals    Frequency  Min 5X/week    PT Plan Current plan remains appropriate    Co-evaluation             End of Session Equipment Utilized During  Treatment: Gait belt;Back brace Activity Tolerance: Patient tolerated treatment well Patient left: with call bell/phone within reach;in bed;with bed alarm set     Time: [id number] PT Time Calculation (min) (ACUTE ONLY): 33 min  Charges:  $Gait Training: 8-22 mins $Therapeutic Activity: 8-22 mins                    G Codes:      [place] April, PTA Pager: [phone]   04/30/2016, 2:37 PM

## 2016-04-30 NOTE — Progress Notes (Signed)
Occupational Therapy Treatment Patient Details Name: BERRY VENABLE MRN: [id number] DOB: [birth year] Today's Date: 04/30/2016    History of present illness patient is a 70 yo female s/p LEFT LUMBAR ONE-TWO, LUMBAR TWO-THREE, LUMBAR THREE-FOUR, LUMBAR FOUR-FIVE ANTERIOR LATERAL LUMBAR FUSION (Left).   OT comments  Pt making gradual progress toward OT goals this session. Able to perform toilet transfer and washing hands standing at the sink with min guard assist but continues to require max assist for donning brace, LB dressing, and peri care. Pt able to recall 0/3 back precautions; reviewed precautions and log roll technique for bed mobility. D/c plan remains appropriate. Will continue to follow acutely.    Follow Up Recommendations  SNF;Supervision/Assistance - 24 hour    Equipment Recommendations  3 in 1 bedside comode    Recommendations for Other Services      Precautions / Restrictions Precautions Precautions: Back Precaution Booklet Issued: No Precaution Comments: Pt able to recall 0/3 back precautions. Reviewed all precautions with pt x2. Required Braces or Orthoses: Spinal Brace Spinal Brace: Thoracolumbosacral orthotic;Applied in sitting position Restrictions Weight Bearing Restrictions: No       Mobility Bed Mobility Overal bed mobility: Needs Assistance Bed Mobility: Rolling;Sidelying to Sit Rolling: Min assist Sidelying to sit: Min assist     Sit to sidelying: Mod assist General bed mobility comments: Cues for log roll technique and assist for LEs off EOB and elevation of trunk to sitting.  Transfers Overall transfer level: Needs assistance Equipment used: Rolling walker (2 wheeled) Transfers: Sit to/from Stand Sit to Stand: Min assist         General transfer comment: Min assist to boost up from EOB x1, BSC x1. VCs for hand placement and technique.    Balance Overall balance assessment: Needs assistance Sitting-balance support: Bilateral upper  extremity supported;Feet supported Sitting balance-Leahy Scale: Fair     Standing balance support: No upper extremity supported;During functional activity Standing balance-Leahy Scale: Poor                     ADL Overall ADL's : Needs assistance/impaired     Grooming: Min guard;Standing;Wash/dry hands           Upper Body Dressing : Maximal assistance;Sitting Upper Body Dressing Details (indicate cue type and reason): to don brace Lower Body Dressing: Total assistance Lower Body Dressing Details (indicate cue type and reason): to adjust socks sitting EOB Toilet Transfer: Min guard;Ambulation;BSC;RW;Cueing for safety   Toileting- Clothing Manipulation and Hygiene: Total assistance;Sit to/from stand Toileting - Clothing Manipulation Details (indicate cue type and reason): total assist for peri care     Functional mobility during ADLs: Min guard;Rolling walker General ADL Comments: Pt unable to recall back precautions or log roll technique; re-educated on maintaining back precautions during functional activities.      Vision                     Perception     Praxis      Cognition   Behavior During Therapy: [facility] for tasks assessed/performed Overall Cognitive Status: Impaired/Different from baseline Area of Impairment: Attention;Memory;Awareness;Problem solving   Current Attention Level: Sustained (can not maintain eye opening) Memory: Decreased recall of precautions  Following Commands: Follows one step commands with increased time;Follows one step commands consistently   Awareness: Intellectual Problem Solving: Slow processing;Decreased initiation;Difficulty sequencing;Requires verbal cues General Comments: Pt awake and alert throughout session but continuously asking "what do you want me to do next" after instructions  had already been provided.    Extremity/Trunk Assessment               Exercises     Shoulder Instructions        General Comments      Pertinent Vitals/ Pain       Pain Assessment: Faces Faces Pain Scale: Hurts even more Pain Location: back Pain Descriptors / Indicators: Aching;Grimacing Pain Intervention(s): Monitored during session;Repositioned  Home Living                                          Prior Functioning/Environment              Frequency Min 2X/week     Progress Toward Goals  OT Goals(current goals can now be found in the care plan section)  Progress towards OT goals: Progressing toward goals  Acute Rehab OT Goals Patient Stated Goal: rehab then home OT Goal Formulation: With patient  Plan Discharge plan remains appropriate    Co-evaluation                 End of Session Equipment Utilized During Treatment: Rolling walker;Back brace   Activity Tolerance Patient tolerated treatment well   Patient Left in chair;with call bell/phone within reach;with nursing/sitter in room;with family/visitor present   Nurse Communication Mobility status        Time: [id number] OT Time Calculation (min): 28 min  Charges: OT General Charges $OT Visit: 1 Procedure OT Treatments $Self Care/Home Management : 23-37 mins  Binnie Kand M.S., OTR/L Pager: [phone]  04/30/2016, 4:19 PM

## 2016-04-30 NOTE — Progress Notes (Signed)
Orthopedic Tech Progress Note Patient Details:  [name] 15-Dec-1945 [id number] Called in bio-tech brace order; spoke with Erin Patient ID: [name] [name], female   DOB: [birth year], 70 y.o.   MRN: [id number]   [name] [name] 04/30/2016, 11:10 AM

## 2016-04-30 NOTE — Clinical Social Work Placement (Signed)
   CLINICAL SOCIAL WORK PLACEMENT  NOTE  Date:  04/30/2016  Patient Details  Name: [name] [name] MRN: [id number] Date of Birth: [birth year]  Clinical Social Work is seeking post-discharge placement for this patient at the [place] level of care (*CSW will initial, date and re-position this form in  chart as items are completed):  Yes   Patient/family provided with [place] Work Department's list of facilities offering this level of care within the geographic area requested by the patient (or if unable, by the patient's family).  Yes   Patient/family informed of their freedom to choose among providers that offer the needed level of care, that participate in Medicare, Medicaid or managed care program needed by the patient, have an available bed and are willing to accept the patient.  Yes   Patient/family informed of [facility]'s ownership interest in [facility] and [facility], as well as of the fact that they are under no obligation to receive care at these facilities.  PASRR submitted to EDS on 04/30/16     PASRR number received on 04/30/16     Existing PASRR number confirmed on       FL2 transmitted to all facilities in geographic area requested by pt/family on 04/30/16     FL2 transmitted to all facilities within larger geographic area on       Patient informed that his/her managed care company has contracts with or will negotiate with certain facilities, including the following:            Patient/family informed of bed offers received.  Patient chooses bed at       Physician recommends and patient chooses bed at      Patient to be transferred to   on  .  Patient to be transferred to facility by       Patient family notified on   of transfer.  Name of family member notified:        PHYSICIAN       Additional Comment:    _______________________________________________ Darden Dates, LCSW 04/30/2016, 4:46 PM

## 2016-04-30 NOTE — NC FL2 (Signed)
[place] LEVEL OF CARE SCREENING TOOL     IDENTIFICATION  Patient Name: [name] [name] Birthdate: March 07, 1946 Sex: female Admission Date (Current Location): 04/25/2016  [facility] and [place] Number:  Herbalist and Address:  [facility] [place], [place] [address], [place], [place] [address]      Provider Number: [id number]  Attending Physician Name and Address:  Earnie Larsson, MD  Relative Name and Phone Number:       Current Level of Care: Hospital Recommended Level of Care: [place] Prior Approval Number:    Date Approved/Denied:   PASRR Number:    Discharge Plan: SNF    Current Diagnoses: Patient Active Problem List   Diagnosis Date Noted  . Degenerative spondylolisthesis 04/25/2016  . Spasm of muscle, back 04/25/2016  . Genetic testing 11/30/2015  . Essential hypertension 10/18/2015  . Morbid obesity ([place]) 10/18/2015  . Upper airway cough syndrome 10/17/2015  . Renal mass 02/05/2015  . Ejection fraction   . Anemia, unspecified 10/25/2013  . Diarrhea 10/04/2013  . GERD (gastroesophageal reflux disease) 09/27/2013  . Seroma complicating a procedure 08/12/2013  . CLL (chronic lymphocytic leukemia) ([place]) 07/27/2013  . Breast cancer of lower-outer quadrant of right female breast ([place]) 07/26/2013  . Nephrolithiasis 06/16/2013  . 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 Chronic pain syndrome 05/10/2013  . H/O long-term treatment with high-risk medication 05/10/2013  . DDD (degenerative disc disease) 05/10/2013  . SOB (shortness of breath)   . Sinus tachycardia ([place])   . HLD (hyperlipidemia)   . Orthostatic hypotension   . Anxiety   . Arthritis   . Diverticulosis   . Fibromyalgia   . Hypothyroidism     Orientation RESPIRATION BLADDER Height & Weight     Self, Situation, Place  O2 (2L) Continent Weight: 192 lb 0.3 oz (87.1 kg) Height:  5\' 3"  (160 cm)  BEHAVIORAL SYMPTOMS/MOOD NEUROLOGICAL BOWEL NUTRITION STATUS       Continent Diet (Regular Diet, Thin Liquids)  AMBULATORY STATUS COMMUNICATION OF NEEDS Skin   Limited Assist Verbally Normal                       Personal Care Assistance Level of Assistance  Bathing, Dressing, Feeding Bathing Assistance: Limited assistance Feeding assistance: Independent Dressing Assistance: Limited assistance     Functional Limitations Info  Sight, Hearing, Speech Sight Info: Adequate Hearing Info: Adequate Speech Info: Adequate    SPECIAL CARE FACTORS FREQUENCY  PT (By licensed PT), OT (By licensed OT)     PT Frequency: 5 OT Frequency: 5            Contractures Contractures Info: Not present    Additional Factors Info  Code Status, Allergies Code Status Info: Full Code Allergies Info: Lasix Furosemide, Lithium, Sulfa Antibiotics, Trazodone And Nefazodone, Lyrica Pregabalin           Current Medications (04/30/2016):  This is the current hospital active medication list Current Facility-Administered Medications  Medication Dose Route Frequency Provider Last Rate Last Dose  . 0.9 %  sodium chloride infusion  250 mL Intravenous Continuous Earnie Larsson, MD      . 0.9 %  sodium chloride infusion   Intravenous Continuous Earnie Larsson, MD 100 mL/hr at 04/29/16 1556    . 0.9 %  sodium chloride infusion   Intravenous Once Erline Levine, MD   Stopped at 04/26/16 1200  . acetaminophen (TYLENOL) tablet 650 mg  650 mg Oral  Q4H PRN Earnie Larsson, MD       Or  . acetaminophen (TYLENOL) suppository 650 mg  650 mg Rectal Q4H PRN Earnie Larsson, MD      . ALPRAZolam Duanne Moron) tablet 0.5 mg  0.5 mg Oral Q8H PRN Earnie Larsson, MD      . amitriptyline (ELAVIL) tablet 150 mg  150 mg Oral QHS Earnie Larsson, MD   150 mg at 04/29/16 2242  . anastrozole (ARIMIDEX) tablet 1 mg  1 mg Oral Daily Earnie Larsson, MD   1 mg at 04/30/16 0958  . aspirin EC tablet 81 mg  81 mg Oral Daily Earnie Larsson, MD   81 mg at 04/30/16 1006  . bisacodyl (DULCOLAX) suppository 10 mg  10 mg Rectal Daily PRN  Earnie Larsson, MD      . Brexpiprazole TABS 2 mg  2 mg Oral QHS Earnie Larsson, MD   2 mg at 04/29/16 2243  . cholecalciferol (VITAMIN D) tablet 1,000 Units  1,000 Units Oral Daily Earnie Larsson, MD   1,000 Units at 04/30/16 [phone]  . cyanocobalamin tablet 2,500 mcg  2,500 mcg Oral Daily Earnie Larsson, MD   2,500 mcg at 04/30/16 1029  . diazepam (VALIUM) tablet 5-10 mg  5-10 mg Oral Q6H PRN Earnie Larsson, MD   10 mg at 04/29/16 1731  . docusate sodium (COLACE) capsule 100 mg  100 mg Oral BID Earnie Larsson, MD   100 mg at 04/30/16 0958  . gabapentin (NEURONTIN) capsule 300 mg  300 mg Oral TID Earnie Larsson, MD   300 mg at 04/30/16 0958  . HYDROcodone-acetaminophen (NORCO) 7.5-325 MG per tablet 1-2 tablet  1-2 tablet Oral Q6H PRN Tamala Fothergill, MD   1 tablet at 04/30/16 [phone]  . levothyroxine (SYNTHROID, LEVOTHROID) tablet 50 mcg  50 mcg Oral QAC breakfast Earnie Larsson, MD   50 mcg at 04/30/16 0818  . menthol-cetylpyridinium (CEPACOL) lozenge 3 mg  1 lozenge Oral PRN Earnie Larsson, MD       Or  . phenol (CHLORASEPTIC) mouth spray 1 spray  1 spray Mouth/Throat PRN Earnie Larsson, MD      . ondansetron [facility]) injection 4 mg  4 mg Intravenous Q4H PRN Earnie Larsson, MD      . pantoprazole (PROTONIX) EC tablet 80 mg  80 mg Oral Daily Earnie Larsson, MD   80 mg at 04/30/16 0959  . polyethylene glycol (MIRALAX / GLYCOLAX) packet 17 g  17 g Oral Daily PRN Earnie Larsson, MD      . sodium chloride flush (NS) 0.9 % injection 3 mL  3 mL Intravenous Q12H Earnie Larsson, MD   3 mL at 04/29/16 2200  . sodium chloride flush (NS) 0.9 % injection 3 mL  3 mL Intravenous PRN Earnie Larsson, MD      . sodium phosphate (FLEET) 7-19 GM/118ML enema 1 enema  1 enema Rectal Once PRN Earnie Larsson, MD      . Vilazodone HCl TABS 40 mg  40 mg Oral Q breakfast Earnie Larsson, MD   40 mg at 04/30/16 0818  . vitamin C (ASCORBIC ACID) tablet 1,000 mg  1,000 mg Oral Daily Earnie Larsson, MD   1,000 mg at 04/30/16 1005  . vitamin E capsule 100 Units  100 Units Oral Daily Earnie Larsson, MD    100 Units at 04/30/16 [id number]     Discharge Medications: Please see discharge summary for a list of discharge medications.  Relevant Imaging Results:  Relevant Lab Results:   Additional  Information SSN:  [id number]  Darden Dates, LCSW

## 2016-04-30 NOTE — Progress Notes (Signed)
Ortho tech called about thoracic extension to back brace per order.

## 2016-04-30 NOTE — Clinical Social Work Note (Signed)
Clinical Social Work Assessment  Patient Details  Name: [name] [name] MRN: [id number] Date of Birth: 03/31/1946  Date of referral:  04/30/16               Reason for consult:  Facility Placement                Permission sought to share information with:  Family Supports Permission granted to share information::  Yes, Verbal Permission Granted  Name::     Forni,Philip  Agency::     Relationship::  husband   Contact Information:  [phone]  Housing/Transportation Living arrangements for the past 2 months:  Single Family Home Source of Information:  Patient Patient Interpreter Needed:  None Criminal Activity/Legal Involvement Pertinent to Current Situation/Hospitalization:  No - Comment as needed Significant Relationships:  Adult Children, Spouse Lives with:  Self Do you feel safe going back to the place where you live?  Yes Need for family participation in patient care:  No (Coment)  Care giving concerns:  No care giving concerns identified.   Social Worker assessment / plan:  CSW met with pt to address consult for New SNF. CSW introduced herself and explained role of social work. CSW also explained process of discharging to SNF. Pt is agreeable to SNF. CSW initiated SNF search and will follow up with bed offers. CSW will also initiated [employer]. CSW will continue to follow.   Employment status:  Retired Nurse, adult PT Recommendations:  [place] / Referral to community resources:  [place]  Patient/Family's Response to care:  Pt was appreciative of CSW support.   Patient/Family's Understanding of and Emotional Response to Diagnosis, Current Treatment, and Prognosis:  Pt is aware that the recommendation is SNF for STR.   Emotional Assessment Appearance:  Appears stated age Attitude/Demeanor/Rapport:  Other (Appropriate) Affect (typically observed):  Accepting Orientation:  Oriented to  Self, Oriented to Place, Oriented to Situation Alcohol / Substance use:  Never Used Psych involvement (Current and /or in the community):  No (Comment)  Discharge Needs  Concerns to be addressed:  Adjustment to Illness Readmission within the last 30 days:  No Current discharge risk:  Chronically ill Barriers to Discharge:  Continued Medical Work up   [employer], LCSW 04/30/2016, 4:52 PM

## 2016-04-30 NOTE — Progress Notes (Signed)
Overall stable. Pain still considerable limiting mobilization. Most pain incisional. No new numbness or weakness. Patient is making some slow gains with physical therapy. Is ambulating in hall with moderate assist. Patient reports flatus. No bowel movement.  She is afebrile. Her vitals are stable. She is more awake and alert. She is oriented and appropriate. Motor and sensory function are intact. Wounds are clean and dry. Abdomen is soft.  Overall progressing as would be expected. Continue efforts at mobilization. Patient will require skilled nursing facility placement for further convalescence prior to discharge home.

## 2016-05-01 NOTE — Clinical Social Work Note (Signed)
CSW expanded bed search as preferred facility did not extend bed offer. Blue Medicare Josem Kaufmann will be granted once a bed choice is made. Pt will be ready for discharge pending BM, likely tomorrow. CSW will continue to follow.   Darden Dates, MSW, LCSW  Clinical Social Worker [phone]

## 2016-05-01 NOTE — Progress Notes (Signed)
Physical Therapy Treatment Patient Details Name: [name] [name] MRN: [id number] DOB: 24-Nov-1945 Today's Date: 05/01/2016    History of Present Illness patient is a 70 yo female s/p LEFT LUMBAR ONE-TWO, LUMBAR TWO-THREE, LUMBAR THREE-FOUR, LUMBAR FOUR-FIVE ANTERIOR LATERAL LUMBAR FUSION (Left).    PT Comments    Patient is making gradual progress toward mobility goals with an increase in activity tolerance today. Continues to demo decreased recall of precautions and has the most difficulty with bed mobility. Current plan remains appropriate.   Follow Up Recommendations  SNF;Supervision/Assistance - 24 hour     Equipment Recommendations  Other (comment) (TBA next venue)    Recommendations for Other Services       Precautions / Restrictions Precautions Precautions: Back Precaution Booklet Issued: No Precaution Comments: pt recalled 2/3 precautions; reviewed 3/3 precautions Required Braces or Orthoses: Spinal Brace Spinal Brace: Lumbar corset;Applied in sitting position Restrictions Weight Bearing Restrictions: No    Mobility  Bed Mobility Overal bed mobility: Needs Assistance Bed Mobility: Rolling;Sidelying to Sit Rolling: Mod assist Sidelying to sit: Mod assist       General bed mobility comments: cues for sequencing and technique; assist with mobilizing bilat LE to roll and bring them off EOB and to elevate trunk into sitting; pt demonstrated very little carry over of technique from previous sessions  Transfers Overall transfer level: Needs assistance Equipment used: Rolling walker (2 wheeled) Transfers: Sit to/from Stand Sit to Stand: Min assist         General transfer comment: from EOB and BSC; assist to power up into standing and cues for safe hand placement and maintaining precautions  Ambulation/Gait Ambulation/Gait assistance: Min guard Ambulation Distance (Feet): 120 Feet Assistive device: Rolling walker (2 wheeled) Gait Pattern/deviations:  Step-through pattern;Decreased stride length Gait velocity: slow   General Gait Details: cues for posture and increased bilat step length; pt with increased activity tolerance this session   Stairs            Wheelchair Mobility    Modified Rankin (Stroke Patients Only)       Balance     Sitting balance-Leahy Scale: Fair       Standing balance-Leahy Scale: Poor                      Cognition Arousal/Alertness: Awake/alert Behavior During Therapy: WFL for tasks assessed/performed Overall Cognitive Status: Impaired/Different from baseline Area of Impairment: Memory;Awareness;Problem solving     Memory: Decreased recall of precautions;Decreased short-term memory Following Commands: Follows one step commands with increased time;Follows one step commands consistently   Awareness: Intellectual Problem Solving: Slow processing;Decreased initiation;Difficulty sequencing;Requires verbal cues      Exercises      General Comments General comments (skin integrity, edema, etc.): educated pt on donning/doffing of brace; pt continues to require max/total A for brace      Pertinent Vitals/Pain Pain Assessment: 0-10 Pain Score: 7  Pain Location: back, R side, and R thigh Pain Descriptors / Indicators: Aching;Sharp;Sore Pain Intervention(s): Limited activity within patient's tolerance;Monitored during session;Premedicated before session;Repositioned    Home Living                      Prior Function            PT Goals (current goals can now be found in the care plan section) Acute Rehab PT Goals Patient Stated Goal: get better Progress towards PT goals: Progressing toward goals    Frequency  Min 5X/week  PT Plan Current plan remains appropriate    Co-evaluation             End of Session Equipment Utilized During Treatment: Gait belt;Back brace Activity Tolerance: Patient tolerated treatment well Patient left: with call bell/phone  within reach;in chair;with chair alarm set     Time: 0935-1007 PT Time Calculation (min) (ACUTE ONLY): 32 min  Charges:  $Gait Training: 8-22 mins $Therapeutic Activity: 8-22 mins                    G Codes:      [place] April, PTA Pager: [phone]   05/01/2016, 11:31 AM

## 2016-05-01 NOTE — Progress Notes (Signed)
Overall stable. Appears more comfortable. Pain better managed. Still complains primarily of back pain. Does note some achiness extending into her thighs bilaterally.  Afebrile. Vital signs are stable. Urine output good. Still no bowel movement. Awake and alert. Oriented and appropriate. Motor and sensory function intact to direct testing. Abdomen soft. Dressings dry.  Progressing slowly. Continue rehabilitation efforts.

## 2016-05-02 ENCOUNTER — Non-Acute Institutional Stay (SKILLED_NURSING_FACILITY): Payer: Medicare Other | Admitting: Internal Medicine

## 2016-05-02 ENCOUNTER — Inpatient Hospital Stay
Admission: RE | Admit: 2016-05-02 | Discharge: 2016-05-26 | Disposition: A | Discharge status: Home / Self Care | Payer: Medicare Other | Source: Ambulatory Visit | Attending: Internal Medicine | Admitting: Internal Medicine

## 2016-05-02 DIAGNOSIS — M4310 Spondylolisthesis, site unspecified: Secondary | ICD-10-CM

## 2016-05-02 DIAGNOSIS — I10 Essential (primary) hypertension: Secondary | ICD-10-CM | POA: Diagnosis not present

## 2016-05-02 DIAGNOSIS — D649 Anemia, unspecified: Secondary | ICD-10-CM | POA: Diagnosis not present

## 2016-05-02 DIAGNOSIS — M549 Dorsalgia, unspecified: Secondary | ICD-10-CM | POA: Diagnosis not present

## 2016-05-02 DIAGNOSIS — G9009 Other idiopathic peripheral autonomic neuropathy: Secondary | ICD-10-CM | POA: Diagnosis not present

## 2016-05-02 DIAGNOSIS — E039 Hypothyroidism, unspecified: Secondary | ICD-10-CM | POA: Diagnosis not present

## 2016-05-02 DIAGNOSIS — E785 Hyperlipidemia, unspecified: Secondary | ICD-10-CM | POA: Diagnosis not present

## 2016-05-02 DIAGNOSIS — C50919 Malignant neoplasm of unspecified site of unspecified female breast: Secondary | ICD-10-CM | POA: Diagnosis not present

## 2016-05-02 DIAGNOSIS — C9110 Chronic lymphocytic leukemia of B-cell type not having achieved remission: Secondary | ICD-10-CM

## 2016-05-02 DIAGNOSIS — C50511 Malignant neoplasm of lower-outer quadrant of right female breast: Secondary | ICD-10-CM | POA: Diagnosis not present

## 2016-05-02 DIAGNOSIS — M4806 Spinal stenosis, lumbar region: Secondary | ICD-10-CM | POA: Diagnosis not present

## 2016-05-02 DIAGNOSIS — I951 Orthostatic hypotension: Secondary | ICD-10-CM | POA: Diagnosis not present

## 2016-05-02 DIAGNOSIS — M4315 Spondylolisthesis, thoracolumbar region: Secondary | ICD-10-CM | POA: Diagnosis not present

## 2016-05-02 DIAGNOSIS — R300 Dysuria: Secondary | ICD-10-CM | POA: Diagnosis not present

## 2016-05-02 DIAGNOSIS — Z7409 Other reduced mobility: Secondary | ICD-10-CM | POA: Diagnosis not present

## 2016-05-02 DIAGNOSIS — F419 Anxiety disorder, unspecified: Secondary | ICD-10-CM

## 2016-05-02 DIAGNOSIS — F329 Major depressive disorder, single episode, unspecified: Secondary | ICD-10-CM | POA: Diagnosis not present

## 2016-05-02 DIAGNOSIS — Z4789 Encounter for other orthopedic aftercare: Secondary | ICD-10-CM | POA: Diagnosis not present

## 2016-05-02 DIAGNOSIS — G894 Chronic pain syndrome: Secondary | ICD-10-CM

## 2016-05-02 DIAGNOSIS — M797 Fibromyalgia: Secondary | ICD-10-CM | POA: Diagnosis not present

## 2016-05-02 DIAGNOSIS — Z9889 Other specified postprocedural states: Secondary | ICD-10-CM | POA: Diagnosis not present

## 2016-05-02 DIAGNOSIS — M6281 Muscle weakness (generalized): Secondary | ICD-10-CM | POA: Diagnosis not present

## 2016-05-02 DIAGNOSIS — Z6832 Body mass index (BMI) 32.0-32.9, adult: Secondary | ICD-10-CM | POA: Diagnosis not present

## 2016-05-02 DIAGNOSIS — M1990 Unspecified osteoarthritis, unspecified site: Secondary | ICD-10-CM | POA: Diagnosis not present

## 2016-05-02 DIAGNOSIS — R262 Difficulty in walking, not elsewhere classified: Secondary | ICD-10-CM | POA: Diagnosis not present

## 2016-05-02 MED ORDER — DIAZEPAM 5 MG PO TABS: 5.0000 mg | ORAL_TABLET | Freq: Four times a day (QID) | ORAL | 0 refills | Status: DC | PRN

## 2016-05-02 MED ORDER — HYDROCODONE-ACETAMINOPHEN 7.5-325 MG PO TABS: 1.0000 | ORAL_TABLET | Freq: Four times a day (QID) | ORAL | 0 refills | Status: DC | PRN

## 2016-05-02 NOTE — Progress Notes (Signed)
Pt discharge education and instructions completed with pt. Report called off to nurse Inez Catalina at [facility]. Pt discharge to [facility] SNF with [facility] to transport her to disposition. Pt back incision dsgs remains clean, dry and intact. Pt transported off unit via stretcher with belongings and spouse at the side. Delia Heady RN

## 2016-05-02 NOTE — Discharge Instructions (Signed)

## 2016-05-02 NOTE — Clinical Social Work Placement (Signed)
   CLINICAL SOCIAL WORK PLACEMENT  NOTE  Date:  05/02/2016  Patient Details  Name: [name] [name] MRN: [id number] Date of Birth: 07-May-1946  Clinical Social Work is seeking post-discharge placement for this patient at the [place] level of care (*CSW will initial, date and re-position this form in  chart as items are completed):  Yes   Patient/family provided with [place] Work Department's list of facilities offering this level of care within the geographic area requested by the patient (or if unable, by the patient's family).  Yes   Patient/family informed of their freedom to choose among providers that offer the needed level of care, that participate in Medicare, Medicaid or managed care program needed by the patient, have an available bed and are willing to accept the patient.  Yes   Patient/family informed of [facility]'s ownership interest in [facility] and [facility], as well as of the fact that they are under no obligation to receive care at these facilities.  PASRR submitted to EDS on 04/30/16     PASRR number received on 05/01/16     Existing PASRR number confirmed on       FL2 transmitted to all facilities in geographic area requested by pt/family on 04/30/16     FL2 transmitted to all facilities within larger geographic area on       Patient informed that his/her managed care company has contracts with or will negotiate with certain facilities, including the following:        Yes   Patient/family informed of bed offers received.  Patient chooses bed at [facility]     Physician recommends and patient chooses bed at      Patient to be transferred to [facility] on 05/02/16.  Patient to be transferred to facility by [facility]     Patient family notified on 05/02/16 of transfer.  Name of family member notified:  Pt's husband     PHYSICIAN       Additional Comment:     _______________________________________________ Darden Dates, LCSW 05/02/2016, 11:51 AM

## 2016-05-02 NOTE — Clinical Social Work Note (Addendum)
RN [place] via [facility] Report# [phone] Room# 111  Pt is ready for discharge today. Pt will go to [facility]. Pt and husband are aware and agreeable to discharge plan. Facility has received discharge information and is ready to admit pt. RN will call report. [facility] will provide transportation. CSW is signing off as needs identified.   Darden Dates, MSW, LCSW  Clinical Social Worker  [phone]  Addendum: Oren Binet Medicare auth:   [id number] (3 days) RUG:  [facility]

## 2016-05-02 NOTE — Progress Notes (Signed)
Overall stable. Pain reasonably well-controlled. Making slow progress with therapy. Neurologically unchanged. Wounds healing well. Abdomen soft.  Progressing well but slowly. Ready for discharge to [facility] unit when bed available.

## 2016-05-02 NOTE — Discharge Summary (Signed)
Physician Discharge Summary  Patient ID: JAZYA CAHALL MRN: [id number] DOB/AGE: 70-Jul-1947 70 y.o.  Admit date: 04/25/2016 Discharge date: 05/02/2016  Admission Diagnoses:  Discharge Diagnoses:  Active Problems:   Degenerative spondylolisthesis   Spasm of muscle, back   Discharged Condition: good  Hospital Course: Patient was admitted to the hospital where she underwent a multilevel lumbar and thoracic decompression and fusion. Postoperatively she is progressing slowly but steadily. She still is limited by significant incisional pain. She's having no true radicular pain. She's having no symptoms of weakness, sensory loss. Plan is for continued convalescence and therapy a skilled nursing facility.  Consults:   Significant Diagnostic Studies:   Treatments:   Discharge Exam: Blood pressure (!) 100/44, pulse 100, temperature 98.1 F (36.7 C), temperature source Oral, resp. rate 18, height 5\' 3"  (1.6 m), weight 87.1 kg (192 lb 0.3 oz), last menstrual period 08/26/1995, SpO2 100 %. Awake and alert. Oriented and appropriate. Cranial nerve function intact. Motor and sensory function extremities and stable. Wounds clean and dry. Chest and abdomen benign.  Disposition: 01-Home or Self Care     Medication List    TAKE these medications   ALPRAZolam 0.5 MG tablet Commonly known as:  XANAX TAKE 1 TABLET EVERY 8 HOURS AS NEEDED What changed:  See the new instructions.   amitriptyline 150 MG tablet Commonly known as:  ELAVIL Take 150 mg by mouth at bedtime.   anastrozole 1 MG tablet Commonly known as:  ARIMIDEX TAKE 1 TABLET (1 MG TOTAL) BY MOUTH DAILY.   aspirin 81 MG tablet Take 81 mg by mouth daily.   Cyanocobalamin 2500 MCG Chew Chew 2,500 mcg by mouth daily.   diazepam 5 MG tablet Commonly known as:  VALIUM Take 1-2 tablets (5-10 mg total) by mouth every 6 (six) hours as needed for muscle spasms.   Fish Oil 1200 MG Caps Take 1,200 mg by mouth daily.   gabapentin 300  MG capsule Commonly known as:  NEURONTIN TK 1 C PO QHS   HAIR/SKIN/NAILS PO Take 1 tablet by mouth daily.   HYDROcodone-acetaminophen 7.5-325 MG tablet Commonly known as:  NORCO Take 1-2 tablets by mouth every 6 (six) hours as needed for moderate pain.   levothyroxine 50 MCG tablet Commonly known as:  SYNTHROID, LEVOTHROID Take 1 tablet (50 mcg total) by mouth daily before breakfast.   omeprazole 40 MG capsule Commonly known as:  PRILOSEC Take 1 capsule (40 mg total) by mouth every morning.   PREVAGEN 10 MG Caps Generic drug:  Apoaequorin Take 50 mg by mouth daily.   REXULTI 2 MG Tabs Generic drug:  Brexpiprazole Take 2 mg by mouth at bedtime.   ROGAINE 2 % external solution Generic drug:  minoxidil Apply 1 application topically 2 (two) times daily as needed (hair.).   VIIBRYD 40 MG Tabs Generic drug:  Vilazodone HCl Take 40 mg by mouth daily.   vitamin C 1000 MG tablet Take 1,000 mg by mouth daily.   VITAMIN D-3 PO Take 1 capsule by mouth daily. Reported on 09/25/2015   vitamin E 100 UNIT capsule Take 100 Units by mouth daily.      Follow-up Information    Charlie Pitter, MD .   Specialty:  Neurosurgery Contact information: [address] [place] [address] [phone]           Signed: Charlie Pitter 05/02/2016, 9:46 AM

## 2016-05-02 NOTE — Progress Notes (Signed)
This is an acute visit.  Level care skilled.  Facility [facility].  Chief complaint-acute visit status post hospitalization with a lumbar and thoracic decompression and fusion secondary to degenerative spondylolisthesis.  History of present illness.  Patient is a pleasant 70 year old female who is here for rehabilitation after being hospitalized for the above procedure.  Postop she progressed slowly but steadily.  Still has significant incisional pain in her back-continues to deny true radicular pain.  She has numerous other diagnoses including history of CLL-history of right breast cancer status post partial mastectomy-hypothyroidism-anxiety-and neuropathy.  Currently her main complaint is back pain although she says the Tylenol she has received when she initially came here is helping she does have a order for Norco to be provided by pharmacy  Vital signs appear to be stable.  Past Medical History:  Diagnosis Date  . Alopecia   . Anxiety   . Arthritis    "spine" (07/28/2013)  . Breast cancer ([place]) 1997; 2014   right  . CAP (community acquired pneumonia)    admission 06-04-2013 secondary to failed oral antibitotics---  last cxr 06-16-2013  much improved  . Chronic back pain    "lower back and upper neck" (07/28/2013)  . CLL (chronic lymphocytic leukemia) ([facility]) dx'd 05/2013   "I don't think I have it though" (07/28/2013)  . DDD (degenerative disc disease)   . Deafness in left ear   . 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 ear   . Depression   . Diverticulosis   . Ecchymosis    FROM IV AND LAB WORK THE PAST WEEK  06-17-2013  . Ejection fraction   . Fibromyalgia 1978  . GERD (gastroesophageal reflux disease)   . Hematuria   . History of syncope    episode 2008--  no recurrence since  . HLD (hyperlipidemia)   . Hypothyroidism   . Kidney stones 2014  . Leukocytosis   . Orthostatic hypotension    slight in the past  . Plantar fasciitis   . Ruptured disk      one in neck and two in back  . S/P chemotherapy, time since greater than 12 weeks   . Sensation of pressure in bladder area   . Sinus tachycardia (HCC)    mild resting  . SOB (shortness of breath) 06-17-2013 CURRENTLY RESIDUAL FROM RECENT CAP   new since sore throats- 12/2014    . Stool incontinence    "at times recently"   . Ureteral calculi    bilateral  . Urgency of urination   . Wears glasses          Past Surgical History:  Procedure Laterality Date  . ABDOMINAL HYSTERECTOMY    . APPENDECTOMY  1997  . BREAST BIOPSY Right 2014  . BREAST LUMPECTOMY WITH AXILLARY LYMPH NODE DISSECTION Right 1997  . CYSTOSCOPY WITH RETROGRADE PYELOGRAM, URETEROSCOPY AND STENT PLACEMENT Bilateral 06/17/2013   Procedure: CYSTOSCOPY WITH RETROGRADE PYELOGRAM, URETEROSCOPY AND LEFT DOUBLE  J STENT PLACEMENT RIGHT URETERAL HOLMIIUM LASER AND DOUBLE J STENT ;  Surgeon: Hanley Ben, MD;  Location: [facility];  Service: Urology;  Laterality: Bilateral;  . CYSTOSCOPY WITH URETEROSCOPY, STONE BASKETRY AND STENT PLACEMENT Bilateral 2014  . HOLMIUM LASER APPLICATION Bilateral [id number]   Procedure: HOLMIUM LASER APPLICATION;  Surgeon: Hanley Ben, MD;  Location: [facility];  Service: Urology;  Laterality: Bilateral;  . LITHOTRIPSY Left ~ 06/2013  . LUMBAR EPIDURAL INJECTION     has had 7 injections  . MASTECTOMY COMPLETE /  SIMPLE Right 07/28/2013  . MASTECTOMY W/ NODES PARTIAL Right 1997  . PORT-A-CATH REMOVAL Left 10/03/2014   Procedure: REMOVAL PORT-A-CATH;  Surgeon: Donnie Mesa, MD;  Location: [facility];  Service: General;  Laterality: Left;  . PORTACATH PLACEMENT Left 07/28/2013   Procedure: ATTEMPTED INSERTION PORT-A-CATH;  Surgeon: Imogene Burn. Georgette Dover, MD;  Location: [facility];  Service: General;  Laterality: Left;  . RETINAL DETACHMENT SURGERY Right 2013  . ROBOTIC ASSITED PARTIAL NEPHRECTOMY Right 02/05/2015   Procedure:  ROBOTIC ASSITED PARTIAL NEPHRECTOMY;  Surgeon: Raynelle Bring, MD;  Location: [facility];  Service: Urology;  Laterality: Right;  . SIMPLE MASTECTOMY WITH AXILLARY SENTINEL NODE BIOPSY Right 07/28/2013   Procedure: RIGHT TOTAL  MASTECTOMY;  Surgeon: Imogene Burn. Georgette Dover, MD;  Location: [facility];  Service: General;  Laterality: Right;  . TONSILLECTOMY  AGE 70  . TOTAL ABDOMINAL HYSTERECTOMY W/ BILATERAL SALPINGOOPHORECTOMY  1997  . TRANSTHORACIC ECHOCARDIOGRAM  05-11-2007  dr Ron Parker   normal lvf/  ef 65%          Family History  Problem Relation Age of Onset  . Heart disease Maternal Grandfather   . Diabetes Maternal Grandfather   . Colon cancer Maternal Aunt 79  . Brain cancer Paternal Grandmother     dx in 43s  . Dementia Mother   . Diabetes Mother   . Osteoporosis Mother   . Diabetes Maternal Aunt   . Prostate cancer Father 15  . Bipolar disorder Maternal Aunt   . Stroke Maternal Aunt   . Stomach cancer Paternal Uncle     dx in late 49s   Social History:  reports that she has never smoked. She has never used smokeless tobacco. She reports that she does not drink alcohol or use drugs.  Allergies:       Allergies  Allergen Reactions  . Lasix [Furosemide] Nausea And Vomiting and Other (See Comments)    HEADACHE  . Lithium Other (See Comments)    Dizziness, "cause me to fall"  . Sulfa Antibiotics Hives  . Trazodone And Nefazodone Other (See Comments)    insomnia  . Lyrica [Pregabalin]    Medication List    TAKE these medications   ALPRAZolam 0.5 MG tablet Commonly known as:  XANAX TAKE 1 TABLET EVERY 8 HOURS AS NEEDED What changed:  See the new instructions.  amitriptyline 150 MG tablet Commonly known as:  ELAVIL Take 150 mg by mouth at bedtime.  anastrozole 1 MG tablet Commonly known as:  ARIMIDEX TAKE 1 TABLET (1 MG TOTAL) BY MOUTH DAILY.  aspirin 81 MG tablet Take 81 mg by mouth daily.  Cyanocobalamin 2500 MCG Chew Chew 2,500 mcg by mouth  daily.  diazepam 5 MG tablet Commonly known as:  VALIUM Take 1-2 tablets (5-10 mg total) by mouth every 6 (six) hours as needed for muscle spasms.  Fish Oil 1200 MG Caps Take 1,200 mg by mouth daily.  gabapentin 300 MG capsule Commonly known as:  NEURONTIN TK 1 C PO QHS  HAIR/SKIN/NAILS PO Take 1 tablet by mouth daily.  HYDROcodone-acetaminophen 7.5-325 MG tablet Commonly known as:  NORCO Take 1-2 tablets by mouth every 6 (six) hours as needed for moderate pain.  levothyroxine 50 MCG tablet Commonly known as:  SYNTHROID, LEVOTHROID Take 1 tablet (50 mcg total) by mouth daily before breakfast.  omeprazole 40 MG capsule Commonly known as:  PRILOSEC Take 1 capsule (40 mg total) by mouth every morning.  PREVAGEN 10 MG Caps Generic drug:  Apoaequorin Take  50 mg by mouth daily.  REXULTI 2 MG Tabs Generic drug:  Brexpiprazole Take 2 mg by mouth at bedtime.  ROGAINE 2 % external solution Generic drug:  minoxidil Apply 1 application topically 2 (two) times daily as needed (hair.).  VIIBRYD 40 MG Tabs Generic drug:  Vilazodone HCl Take 40 mg by mouth daily.  vitamin C 1000 MG tablet Take 1,000 mg by mouth daily.  VITAMIN D-3 PO Take 1 capsule by mouth daily. Reported on 09/25/2015  vitamin E 100 UNIT capsule Take 100 Units by mouth daily.     Review of systems.  In general is denying any fever or chills.  Skin incision site looks to be stable is not complaining of rashes or itching.  Head ears eyes nose mouth and throat does not complaining of visual changes or sore throat.  Respiratory denies shortness of breath or cough.  Cardiac no chest pain or significant lower extremity edema.  GI does not complaining of nausea vomiting diarrhea has had intermittent constipation says she is having some movements.  GU does not complain of dysuria.  Muscle skeletal is complaining of back incisional pain apparently somewhat relieved when she lies still.  Neurologic is not  complaining of dizziness headache or increased numbness she states she does have some chronic numbness in her feet.  Psych-is not complaining of anxiety or depression appears to have a history of this however at times.  Physical exam. Temperature 98.2 pulse 90 respirations 19 blood pressure 133/69  In general this is a pleasant elderly female in no distress resting comfortably in bed.  Her skin is warm and dry she does have dressing over a longitudinal surgical incision of her lower back-there is clear dressing incision appears to be healing unremarkably a do not see any drainage or erythema extending from it that appears concerning.  Eyes pupils appear reactive light sclera and conjunctiva are clear visual acuity appears grossly intact.  Oropharynx is clear mucous membranes moist.  Chest is clear to auscultation there is no labored breathing.  Heart is regular rate and rhythm without murmur gallop or rub she does not really have significant lower extremity edema pedal pulses are intact.  GU could not really appreciate suprapubic tenderness or distention.  Abdomen is obese soft nontender with positive bowel sounds.  Muscle skeletal does move all extremities 4 limited exam since she is in bed grip strength appears to be intact bilaterally is able to move her legs.  Neurologic is grossly intact her speech is clear touch sensation is intact lower extremities bilaterally-Babinski is somewhat equivocal.  Psych she is alert and oriented pleasant and appropriate.  Labs.  04/27/2016.  WBC 30.5-hemoglobin 8.0-platelets are 102,000.  Assessment plan.  #1 history of degenerative spondylolisthesis with multilevel lumbar and thoracic decompression and fusion-her postop course continues to be slow but apparently study in the hospital-she will need continued PT and OT-she does have an order for Norco 7.5-325 mg 1-2 tabs every 6 hours when necessary--she also has an order for Neurontin 300 mg  daily at bedtime. As well as Valium every 6 hours when necessary for muscle spasms.      This point continue to monitor.  #2 history of breast cancer on the right she continues on arm index she is followed closely by oncology per review of oncology no.  #3 history of CLL-again this is followed by oncology white count and hemoglobin appears to be quite variable per chart review will update a CBC first laboratory day next  week.  #4 history of depression this appears stable currently on Elavil as well as  Rexulti  daily at bedtime.  #5 history of hypothyroidism she is on Synthroid we will update a TSH Laboratory day as well.  #6 history of alopecia she continues on Rogaine 2 times a day when necessary.  #7 history of anxiety she continues on Xanax 0.5 mg every 8 hours when necessary at this point appear stable as well.  Of note Will update a CBC and metabolic panel and TSH Laboratory day clinically she appears stable she will need extensive rehabilitation pain I suspect will also be an issue at this point will monitor.  [id number] note greater than 40 minutes spent assessing patient-reviewing her chart-reviewing her labs-and coordinating formulating a plan of care for numerous diagnoses-of note greater than 50% of time spent coordinating plan of care

## 2016-05-02 NOTE — Care Management Note (Signed)
Case Management Note  Patient Details  Name: [name] [name] MRN: [id number] Date of Birth: [birth year]  Subjective/Objective:                    Action/Plan: Pt discharging to [facility] today. No further needs per CM.   Expected Discharge Date:                  Expected Discharge Plan:  Skilled Nursing Facility  In-House Referral:  Clinical Social Work  Discharge planning Services  CM Consult  Post Acute Care Choice:    Choice offered to:     DME Arranged:    DME Agency:     HH Arranged:    [place] Agency:     Status of Service:  Completed, signed off  If discussed at [employer], dates discussed:    Additional Comments:  Pollie Friar, RN 05/02/2016, 11:56 AM

## 2016-05-05 ENCOUNTER — Non-Acute Institutional Stay (SKILLED_NURSING_FACILITY): Payer: Medicare Other | Admitting: Internal Medicine

## 2016-05-05 ENCOUNTER — Encounter: Payer: Self-pay | Admitting: Internal Medicine

## 2016-05-05 ENCOUNTER — Other Ambulatory Visit (HOSPITAL_COMMUNITY)
Admission: RE | Admit: 2016-05-05 | Discharge: 2016-05-05 | Disposition: A | Discharge status: Home / Self Care | Payer: Medicare Other | Source: Skilled Nursing Facility | Attending: Internal Medicine | Admitting: Internal Medicine

## 2016-05-05 DIAGNOSIS — Z9889 Other specified postprocedural states: Secondary | ICD-10-CM | POA: Diagnosis not present

## 2016-05-05 DIAGNOSIS — M4315 Spondylolisthesis, thoracolumbar region: Secondary | ICD-10-CM | POA: Insufficient documentation

## 2016-05-05 DIAGNOSIS — D649 Anemia, unspecified: Secondary | ICD-10-CM | POA: Diagnosis not present

## 2016-05-05 DIAGNOSIS — F32A Depression, unspecified: Secondary | ICD-10-CM | POA: Insufficient documentation

## 2016-05-05 DIAGNOSIS — F329 Major depressive disorder, single episode, unspecified: Secondary | ICD-10-CM

## 2016-05-05 DIAGNOSIS — E039 Hypothyroidism, unspecified: Secondary | ICD-10-CM

## 2016-05-05 DIAGNOSIS — R300 Dysuria: Secondary | ICD-10-CM | POA: Diagnosis not present

## 2016-05-05 DIAGNOSIS — C50919 Malignant neoplasm of unspecified site of unspecified female breast: Secondary | ICD-10-CM | POA: Diagnosis not present

## 2016-05-05 DIAGNOSIS — Z4789 Encounter for other orthopedic aftercare: Secondary | ICD-10-CM | POA: Insufficient documentation

## 2016-05-05 DIAGNOSIS — M797 Fibromyalgia: Secondary | ICD-10-CM | POA: Insufficient documentation

## 2016-05-05 DIAGNOSIS — C9110 Chronic lymphocytic leukemia of B-cell type not having achieved remission: Secondary | ICD-10-CM

## 2016-05-05 LAB — BASIC METABOLIC PANEL
ANION GAP: 7 (ref 5–15)
BUN: 13 mg/dL (ref 6–20)
CALCIUM: 8.1 mg/dL — AB (ref 8.9–10.3)
CO2: 27 mmol/L (ref 22–32)
Chloride: 108 mmol/L (ref 101–111)
Creatinine, Ser: 0.95 mg/dL (ref 0.44–1.00)
GFR calc Af Amer: >60 mL/min (ref >60)
GFR calc non Af Amer: 59 mL/min — ABNORMAL LOW (ref >60)
GLUCOSE: 100 mg/dL — AB (ref 65–99)
Potassium: 4.2 mmol/L (ref 3.5–5.1)
Sodium: 142 mmol/L (ref 135–145)

## 2016-05-05 LAB — CBC WITH DIFFERENTIAL/PLATELET
BASOS PCT: 0 %
Band Neutrophils: 0 %
Basophils Absolute: 0 10*3/uL (ref 0.0–0.1)
Blasts: 0 %
EOS PCT: 1 %
Eosinophils Absolute: 0.3 10*3/uL (ref 0.0–0.7)
HCT: 22.8 % — ABNORMAL LOW (ref 36.0–46.0)
Hemoglobin: 7 g/dL — ABNORMAL LOW (ref 12.0–15.0)
LYMPHS ABS: 25 10*3/uL — AB (ref 0.7–4.0)
Lymphocytes Relative: 79 %
MCH: 27.2 pg (ref 26.0–34.0)
MCHC: 30.7 g/dL (ref 30.0–36.0)
MCV: 88.7 fL (ref 78.0–100.0)
METAMYELOCYTES PCT: 0 %
MONO ABS: 0.9 10*3/uL (ref 0.1–1.0)
Monocytes Relative: 3 %
Myelocytes: 0 %
NEUTROS ABS: 5.4 10*3/uL (ref 1.7–7.7)
NEUTROS PCT: 17 %
PLATELETS: 200 10*3/uL (ref 150–400)
PROMYELOCYTES ABS: 0 %
RBC: 2.57 MIL/uL — ABNORMAL LOW (ref 3.87–5.11)
RDW: 16.7 % — ABNORMAL HIGH (ref 11.5–15.5)
SMEAR REVIEW: INCREASED
WBC: 31.6 10*3/uL — ABNORMAL HIGH (ref 4.0–10.5)
nRBC: 0 /100 WBC

## 2016-05-05 LAB — URINALYSIS, ROUTINE W REFLEX MICROSCOPIC
Bilirubin Urine: NEGATIVE
GLUCOSE, UA: NEGATIVE mg/dL
KETONES UR: NEGATIVE mg/dL
Nitrite: NEGATIVE
PH: 6 (ref 5.0–8.0)
Protein, ur: NEGATIVE mg/dL
Specific Gravity, Urine: 1.015 (ref 1.005–1.030)

## 2016-05-05 LAB — URINE MICROSCOPIC-ADD ON

## 2016-05-05 LAB — TSH: TSH: 6.014 u[IU]/mL — ABNORMAL HIGH (ref 0.350–4.500)

## 2016-05-05 NOTE — Progress Notes (Signed)
Provider:  Veleta Miners Location:   [facility] Room Number: [id number] Place of Service:  SNF (31)  PCP: [name], MD Patient Care Team: Darlyne Russian, MD as PCP - General (Family Medicine) Chauncey Cruel, MD as Consulting Physician (Oncology)  Extended Emergency Contact Information Primary Emergency Contact: Iqbal,Philip Address: [address] [place], [place] [address] Johnnette Litter of [place] Phone: [phone] Mobile Phone: [phone] Relation: Spouse Secondary Emergency Contact: Wasilewski,Stephanie Address: [address] [address] Johnnette Litter of [place] Phone: [phone] Mobile Phone: [phone] Relation: Daughter  Code Status: Full Code Goals of Care: Advanced Directive information Advanced Directives 05/05/2016  Does patient have an advance directive? Yes  Type of Advance Directive (No Data)  Does patient want to make changes to advanced directive? No - Patient declined  Copy of advanced directive(s) in chart? Yes  Pre-existing out of facility DNR order (yellow form or pink MOST form) -      Chief Complaint  Patient presents with  . New Admit To SNF    HPI: Patient is a 70 y.o. female seen today for admission to SNF for PT after multi level Thoracic Decompression and fusion. Patient is doing well. Continue to C/O pain and muscle spasm in her back. Going down right leg. Also having some weakness. No Chest pain Or SOB. She has not noticed any blood in stool. She is also C/O Dysuria. And symptoms of stress incontinence.  Past Medical History:  Diagnosis Date  . Alopecia   . Anxiety   . Arthritis    "spine" (07/28/2013)  . Breast cancer ([facility]) 1997; 2014   right  . CAP (community acquired pneumonia)    admission 06-04-2013 secondary to failed oral antibitotics---  last cxr 06-16-2013  much improved  . Chronic back pain    "lower back and upper neck" (07/28/2013)  . CLL  (chronic lymphocytic leukemia) ([facility]) dx'd 05/2013   "I don't think I have it though" (07/28/2013)  . DDD (degenerative disc disease)   . Deafness in left ear   . 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 ear   . Depression   . Diverticulosis   . Ecchymosis    FROM IV AND LAB WORK THE PAST WEEK  06-17-2013  . Ejection fraction   . Fibromyalgia 1978  . GERD (gastroesophageal reflux disease)   . Hematuria   . History of syncope    episode 2008--  no recurrence since  . HLD (hyperlipidemia)   . Hypothyroidism   . Kidney stones 2014  . Leukocytosis   . Orthostatic hypotension    slight in the past  . Plantar fasciitis   . Ruptured disk    one in neck and two in back  . S/P chemotherapy, time since greater than 12 weeks   . Sensation of pressure in bladder area   . Sinus tachycardia (HCC)    mild resting  . SOB (shortness of breath) 06-17-2013 CURRENTLY RESIDUAL FROM RECENT CAP   new since sore throats- 12/2014    . Stool incontinence    "at times recently"   . Ureteral calculi    bilateral  . Urgency of urination   . Wears glasses    Past Surgical History:  Procedure Laterality Date  . ABDOMINAL HYSTERECTOMY    . ANTERIOR LATERAL LUMBAR FUSION 4 LEVELS Left 04/25/2016   Procedure: LEFT LUMBAR ONE-TWO,  LUMBAR TWO-THREE, LUMBAR THREE-FOUR, LUMBAR FOUR-FIVE ANTERIOR LATERAL LUMBAR FUSION;  Surgeon: Earnie Larsson, MD;  Location: [facility];  Service: Neurosurgery;  Laterality: Left;  . APPENDECTOMY  1997  . BREAST BIOPSY Right 2014  . BREAST LUMPECTOMY WITH AXILLARY LYMPH NODE DISSECTION Right 1997  . CYSTOSCOPY WITH RETROGRADE PYELOGRAM, URETEROSCOPY AND STENT PLACEMENT Bilateral 06/17/2013   Procedure: CYSTOSCOPY WITH RETROGRADE PYELOGRAM, URETEROSCOPY AND LEFT DOUBLE  J STENT PLACEMENT RIGHT URETERAL HOLMIIUM LASER AND DOUBLE J STENT ;  Surgeon: Hanley Ben, MD;  Location: [facility];  Service: Urology;  Laterality: Bilateral;  . CYSTOSCOPY WITH URETEROSCOPY, STONE BASKETRY AND STENT  PLACEMENT Bilateral 2014  . HOLMIUM LASER APPLICATION Bilateral [id number]   Procedure: HOLMIUM LASER APPLICATION;  Surgeon: Hanley Ben, MD;  Location: [facility];  Service: Urology;  Laterality: Bilateral;  . LITHOTRIPSY Left ~ 06/2013  . LUMBAR EPIDURAL INJECTION     has had 7 injections  . MASTECTOMY COMPLETE / SIMPLE Right 07/28/2013  . MASTECTOMY W/ NODES PARTIAL Right 1997  . PORT-A-CATH REMOVAL Left 10/03/2014   Procedure: REMOVAL PORT-A-CATH;  Surgeon: Donnie Mesa, MD;  Location: [facility];  Service: General;  Laterality: Left;  . PORTACATH PLACEMENT Left 07/28/2013   Procedure: ATTEMPTED INSERTION PORT-A-CATH;  Surgeon: Imogene Burn. Georgette Dover, MD;  Location: [facility];  Service: General;  Laterality: Left;  . POSTERIOR LUMBAR FUSION 4 LEVEL N/A 04/25/2016   Procedure: LUMBAR FIVE-SACRAL ONE POSTERIOR LUMBAR INTERBODY FUSION, THORACIC NINE-SACRAL ONE POSTERIOR LATERAL ARTHRODESIS WITH PEDICLE SCREWS;  Surgeon: Earnie Larsson, MD;  Location: [facility];  Service: Neurosurgery;  Laterality: N/A;  . RETINAL DETACHMENT SURGERY Right 2013  . ROBOTIC ASSITED PARTIAL NEPHRECTOMY Right 02/05/2015   Procedure: ROBOTIC ASSITED PARTIAL NEPHRECTOMY;  Surgeon: Raynelle Bring, MD;  Location: [facility];  Service: Urology;  Laterality: Right;  . SIMPLE MASTECTOMY WITH AXILLARY SENTINEL NODE BIOPSY Right 07/28/2013   Procedure: RIGHT TOTAL  MASTECTOMY;  Surgeon: Imogene Burn. Georgette Dover, MD;  Location: [facility];  Service: General;  Laterality: Right;  . TONSILLECTOMY  AGE 88  . TOTAL ABDOMINAL HYSTERECTOMY W/ BILATERAL SALPINGOOPHORECTOMY  1997  . TRANSTHORACIC ECHOCARDIOGRAM  05-11-2007  dr Ron Parker   normal lvf/  ef 65%    reports that she has never smoked. She has never used smokeless tobacco. She reports that she does not drink alcohol or use drugs. Social History   Social History  . Marital status: Married    Spouse name: N/A  . Number of children: 1  . Years of education: N/A    Occupational History  . homemaker    Social History Main Topics  . Smoking status: Never Smoker  . Smokeless tobacco: Never Used  . Alcohol use No     Comment: 07/28/2013 "no alcohol since 1994; never had problem w/it"  . Drug use: No  . Sexual activity: No     Comment: TAH/BSO   Other Topics Concern  . Not on file   Social History Narrative  . No narrative on file    Functional Status Survey:    Family History  Problem Relation Age of Onset  . Heart disease Maternal Grandfather   . Diabetes Maternal Grandfather   . Colon cancer Maternal Aunt 79  . Brain cancer Paternal Grandmother     dx in 55s  . Dementia Mother   . Diabetes Mother   . Osteoporosis Mother   . Diabetes Maternal Aunt   . Prostate cancer Father 24  . Bipolar  disorder Maternal Aunt   . Stroke Maternal Aunt   . Stomach cancer Paternal Uncle     dx in late 20s    Health Maintenance  Topic Date Due  . Hepatitis C Screening  08/31/1945  . ZOSTAVAX  10/29/2005  . PNA vac Low Risk Adult (1 of 2 - PCV13) 10/30/2010  . INFLUENZA VACCINE  03/25/2016  . MAMMOGRAM  01/31/2018  . COLONOSCOPY  11/02/2024  . TETANUS/TDAP  11/22/2025  . DEXA SCAN  Completed    Allergies  Allergen Reactions  . Lasix [Furosemide] Nausea And Vomiting and Other (See Comments)    HEADACHE  . Lithium Other (See Comments)    Dizziness, "cause me to fall"  . Sulfa Antibiotics Hives  . Trazodone And Nefazodone Other (See Comments)    insomnia  . Lyrica [Pregabalin] Diarrhea, Nausea Only and Rash   Current Outpatient Prescriptions on File Prior to Visit  Medication Sig Dispense Refill  . amitriptyline (ELAVIL) 150 MG tablet Take 150 mg by mouth at bedtime.    Marland Kitchen anastrozole (ARIMIDEX) 1 MG tablet TAKE 1 TABLET (1 MG TOTAL) BY MOUTH DAILY. 90 tablet 1  . Apoaequorin (PREVAGEN) 10 MG CAPS Take 50 mg by mouth daily.    . Ascorbic Acid (VITAMIN C) 1000 MG tablet Take 1,000 mg by mouth daily.    Marland Kitchen aspirin 81 MG tablet Take 81  mg by mouth daily.    . Cholecalciferol (VITAMIN D-3 PO) Take 1 capsule by mouth daily. Reported on 09/25/2015    . Cyanocobalamin 2500 MCG CHEW Chew 2,500 mcg by mouth daily.    . diazepam (VALIUM) 5 MG tablet Take 1-2 tablets (5-10 mg total) by mouth every 6 (six) hours as needed for muscle spasms. 30 tablet 0  . gabapentin (NEURONTIN) 300 MG capsule TK 1 C PO QHS  4  . HYDROcodone-acetaminophen (NORCO) 7.5-325 MG tablet Take 1-2 tablets by mouth every 6 (six) hours as needed for moderate pain. 30 tablet 0  . levothyroxine (SYNTHROID, LEVOTHROID) 50 MCG tablet Take 1 tablet (50 mcg total) by mouth daily before breakfast. 90 tablet 4  . minoxidil (ROGAINE) 2 % external solution Apply 1 application topically 2 (two) times daily as needed (hair.).     Marland Kitchen Multiple Vitamins-Minerals (HAIR/SKIN/NAILS PO) Take 1 tablet by mouth daily.    . Omega-3 Fatty Acids (FISH OIL) 1200 MG CAPS Take 1,200 mg by mouth daily.    Marland Kitchen omeprazole (PRILOSEC) 40 MG capsule Take 1 capsule (40 mg total) by mouth every morning. 90 capsule 1  . REXULTI 2 MG TABS Take 2 mg by mouth at bedtime.   0  . VIIBRYD 40 MG TABS Take 40 mg by mouth daily.   1  . vitamin E 100 UNIT capsule Take 100 Units by mouth daily.     No current facility-administered medications on file prior to visit.     Review of Systems  Constitutional: Positive for fatigue. Negative for chills, diaphoresis and fever.  Respiratory: Negative.   Cardiovascular: Negative.   Gastrointestinal: Negative for abdominal distention, abdominal pain, blood in stool, constipation, diarrhea, nausea, rectal pain and vomiting.  Musculoskeletal: Positive for back pain.  Neurological: Negative.     Vitals:   05/05/16 1324  BP: (!) 91/47 Repeat with Manual Cuff was 108/62  Pulse: (!) 57  Resp: 20  Temp: 98.2 F (36.8 C)  TempSrc: Oral  SpO2: 92%   There is no height or weight on file to calculate BMI. Physical Exam  Constitutional: She is oriented to person,  place, and time. She appears well-developed and well-nourished.  HENT:  Head: Normocephalic.  Mouth/Throat: Oropharynx is clear and moist.  Eyes: Pupils are equal, round, and reactive to light.  Cardiovascular: Normal rate, regular rhythm and normal heart sounds.   Pulmonary/Chest: Effort normal and breath sounds normal. No respiratory distress. She has no rales.  Abdominal: Bowel sounds are normal. She exhibits no distension. There is no tenderness. There is no guarding.  Musculoskeletal: She exhibits edema.  Neurological: She is alert and oriented to person, place, and time.  Motor strength Normal in All extremities.    Labs reviewed: Basic Metabolic Panel:  Recent Labs  04/17/16 1203  04/25/16 1245 04/26/16 1005 05/05/16 0620  NA 139  < > 141 138 142  K 4.9  < > 4.7 4.8 4.2  CL 107  --   --  115* 108  CO2 24  --   --  17* 27  GLUCOSE 94  --  148* 127* 100*  BUN 17  --   --  21* 13  CREATININE 1.15*  --   --  1.61* 0.95  CALCIUM 9.9  --   --  6.9* 8.1*  < > = values in this interval not displayed. Liver Function Tests:  Recent Labs  11/09/15 1327 03/10/16 1222  AST 40* 50*  ALT 68* 55  ALKPHOS 97 82  BILITOT <0.30 <0.30  PROT 7.3 6.9  ALBUMIN 4.3 4.2     CBC:  Recent Labs  04/17/16 1203  04/26/16 1005 04/27/16 0455 05/05/16 0620  WBC 31.3*  --  65.1* 38.5* 31.6*  NEUTROABS 2.8  --   --  6.5 5.4  HGB 11.2*  < > 5.9* 8.0* 7.0*  HCT 36.2  < > 20.4* 25.4* 22.8*  MCV 85.2  --  90.3 87.9 88.7  PLT 289  --  179 102* 200  < > = values in this interval not displayed. Cardiac Enzymes:  BNP: Invalid input(s): POCBNP No results found for: HGBA1C Lab Results  Component Value Date   TSH 6.014 (H) 05/05/2016   Lab Results  Component Value Date   [id number] 01/03/2014   Lab Results  Component Value Date   FOLATE 18.1 01/03/2014   Lab Results  Component Value Date   FERRITIN 12 01/03/2014    Imaging and Procedures obtained prior to SNF  admission: No results found.  Assessment/Plan   S/P Thoracic and Lumbar Decompression.  Patient doing well now thought recovery stays slow. Will Discontinue her Valium. Start on Flexeril PRN for spasm Continue Vicodin for pain.  Anemia  Her HGB is down to 7. It was down to 5.9 in the hospital and she was transfused 2 units. It is down from 8. It seems secondary to chronic disease and Post surgical.  Her Colonoscopy in 10/2014 . Will start her on Iron supplement. Recheck HGB in few days again. Patient is asymptomatic right now except her weakness. Will type and cross too with next CBC.   CLL (chronic lymphocytic leukemia)   Her White Count is stable 31. She says that she follows with hematologist and had been told for no medication except Monitor for now.  Dysuria  Patient was C/O dysuria. Will check UA  Depression Followed by Psychiatrist. Continue her meds.  Hypothyroidism  Will increase the Synthroid to 75 mcg. Repeat TSH in 6 weeks.  Patient Plan is to be discharged home with her husband.   .   Family/  staff Communication:   Labs/tests ordered:

## 2016-05-07 ENCOUNTER — Encounter (HOSPITAL_COMMUNITY)
Admission: RE | Admit: 2016-05-07 | Discharge: 2016-05-07 | Disposition: A | Discharge status: Home / Self Care | Payer: Medicare Other | Source: Skilled Nursing Facility | Attending: Internal Medicine | Admitting: Internal Medicine

## 2016-05-07 ENCOUNTER — Other Ambulatory Visit: Payer: Self-pay | Admitting: *Deleted

## 2016-05-07 LAB — CBC
HEMATOCRIT: 26.7 % — AB (ref 36.0–46.0)
Hemoglobin: 8.1 g/dL — ABNORMAL LOW (ref 12.0–15.0)
MCH: 27.5 pg (ref 26.0–34.0)
MCHC: 30.3 g/dL (ref 30.0–36.0)
MCV: 90.5 fL (ref 78.0–100.0)
Platelets: 296 10*3/uL (ref 150–400)
RBC: 2.95 MIL/uL — ABNORMAL LOW (ref 3.87–5.11)
RDW: 16.9 % — AB (ref 11.5–15.5)
WBC: 38.2 10*3/uL — ABNORMAL HIGH (ref 4.0–10.5)

## 2016-05-07 MED ORDER — HYDROCODONE-ACETAMINOPHEN 7.5-325 MG PO TABS
ORAL_TABLET | ORAL | 0 refills | Status: DC
Start: 2016-05-07 — End: 2016-07-03

## 2016-05-07 NOTE — Telephone Encounter (Signed)
[facility] #[phone] Fax: [phone]   

## 2016-05-08 ENCOUNTER — Encounter (HOSPITAL_COMMUNITY)
Admit: 2016-05-08 | Discharge: 2016-05-08 | Disposition: A | Discharge status: Home / Self Care | Payer: Medicare Other | Attending: Internal Medicine | Admitting: Internal Medicine

## 2016-05-09 ENCOUNTER — Encounter (HOSPITAL_COMMUNITY): Payer: Medicare Other

## 2016-05-22 DIAGNOSIS — Z6832 Body mass index (BMI) 32.0-32.9, adult: Secondary | ICD-10-CM | POA: Diagnosis not present

## 2016-05-22 DIAGNOSIS — M4806 Spinal stenosis, lumbar region: Secondary | ICD-10-CM | POA: Diagnosis not present

## 2016-05-26 ENCOUNTER — Encounter: Payer: Self-pay | Admitting: Internal Medicine

## 2016-05-26 ENCOUNTER — Non-Acute Institutional Stay (SKILLED_NURSING_FACILITY): Payer: Medicare Other | Admitting: Internal Medicine

## 2016-05-26 DIAGNOSIS — F32A Depression, unspecified: Secondary | ICD-10-CM

## 2016-05-26 DIAGNOSIS — I10 Essential (primary) hypertension: Secondary | ICD-10-CM | POA: Diagnosis not present

## 2016-05-26 DIAGNOSIS — F329 Major depressive disorder, single episode, unspecified: Secondary | ICD-10-CM | POA: Diagnosis not present

## 2016-05-26 DIAGNOSIS — M4310 Spondylolisthesis, site unspecified: Secondary | ICD-10-CM | POA: Diagnosis not present

## 2016-05-26 DIAGNOSIS — D649 Anemia, unspecified: Secondary | ICD-10-CM

## 2016-05-26 DIAGNOSIS — E039 Hypothyroidism, unspecified: Secondary | ICD-10-CM

## 2016-05-26 DIAGNOSIS — Z9889 Other specified postprocedural states: Secondary | ICD-10-CM | POA: Diagnosis not present

## 2016-05-26 NOTE — Progress Notes (Signed)
Location:   [facility] Room Number: [id number] Place of Service:  SNF (31)  Provider: Iona Hansen  PCP: [name], MD Patient Care Team: Darlyne Russian, MD as PCP - General (Family Medicine) Chauncey Cruel, MD as Consulting Physician (Oncology)  Extended Emergency Contact Information Primary Emergency Contact: Volk,Philip Address: [address] [place], [place] [address] [place] of [place] Phone: [phone] Mobile Phone: [phone] Relation: Spouse Secondary Emergency Contact: Creek,Stephanie Address: [address] [place] [address] [place] Phone: [phone] Mobile Phone: [phone] Relation: Daughter  Code Status: Full Code Goals of care:  Advanced Directive information Advanced Directives 05/26/2016  Does patient have an advance directive? Yes  Type of Advance Directive (No Data)  Does patient want to make changes to advanced directive? No - Patient declined  Copy of advanced directive(s) in chart? Yes  Pre-existing out of facility DNR order (yellow form or pink MOST form) -     Allergies  Allergen Reactions  . Lasix [Furosemide] Nausea And Vomiting and Other (See Comments)    HEADACHE  . Lithium Other (See Comments)    Dizziness, "cause me to fall"  . Sulfa Antibiotics Hives  . Trazodone And Nefazodone Other (See Comments)    insomnia  . Lyrica [Pregabalin] Diarrhea, Nausea Only and Rash    Chief Complaint  Patient presents with  . Discharge Note    HPI:  70 y.o. female  Who was seen today for discharge from the facility. Patient is walking with little support. She does have to wear a brace for her spine. She is palnnung to go home with her husband. She is doing well had No complains. The pain is well controlled and sometimes has some muscle spasm. Dysuria is resolved now. She is going to follow up with Neurosurgeon and her PCP. Also Follows with  Psychiatrist for her Major Depression.    Past Medical History:  Diagnosis Date  . Alopecia   . Anxiety   . Arthritis    "spine" (07/28/2013)  . Breast cancer ([facility]) 1997; 2014   right  . CAP (community acquired pneumonia)    admission 06-04-2013 secondary to failed oral antibitotics---  last cxr 06-16-2013  much improved  . Chronic back pain    "lower back and upper neck" (07/28/2013)  . CLL (chronic lymphocytic leukemia) ([facility]) dx'd 05/2013   "I don't think I have it though" (07/28/2013)  . DDD (degenerative disc disease)   . Deafness in left ear   . 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 ear   . Depression   . Diverticulosis   . Ecchymosis    FROM IV AND LAB WORK THE PAST WEEK  06-17-2013  . Ejection fraction   . Fibromyalgia 1978  . GERD (gastroesophageal reflux disease)   . Hematuria   . History of syncope    episode 2008--  no recurrence since  . HLD (hyperlipidemia)   . Hypothyroidism   . Kidney stones 2014  . Leukocytosis   . Orthostatic hypotension    slight in the past  . Plantar fasciitis   . Ruptured disk    one in neck and two in back  . S/P chemotherapy, time since greater than 12 weeks   . Sensation of pressure in bladder area   . Sinus tachycardia    mild resting  . SOB (shortness of breath) 06-17-2013  CURRENTLY RESIDUAL FROM RECENT CAP   new since sore throats- 12/2014    . Stool incontinence    "at times recently"   . Ureteral calculi    bilateral  . Urgency of urination   . Wears glasses     Past Surgical History:  Procedure Laterality Date  . ABDOMINAL HYSTERECTOMY    . ANTERIOR LATERAL LUMBAR FUSION 4 LEVELS Left 04/25/2016   Procedure: LEFT LUMBAR ONE-TWO, LUMBAR TWO-THREE, LUMBAR THREE-FOUR, LUMBAR FOUR-FIVE ANTERIOR LATERAL LUMBAR FUSION;  Surgeon: Earnie Larsson, MD;  Location: [facility];  Service: Neurosurgery;  Laterality: Left;  . APPENDECTOMY  1997  . BREAST BIOPSY Right 2014  . BREAST LUMPECTOMY WITH AXILLARY LYMPH NODE DISSECTION Right 1997  . CYSTOSCOPY  WITH RETROGRADE PYELOGRAM, URETEROSCOPY AND STENT PLACEMENT Bilateral 06/17/2013   Procedure: CYSTOSCOPY WITH RETROGRADE PYELOGRAM, URETEROSCOPY AND LEFT DOUBLE  J STENT PLACEMENT RIGHT URETERAL HOLMIIUM LASER AND DOUBLE J STENT ;  Surgeon: Hanley Ben, MD;  Location: [facility];  Service: Urology;  Laterality: Bilateral;  . CYSTOSCOPY WITH URETEROSCOPY, STONE BASKETRY AND STENT PLACEMENT Bilateral 2014  . HOLMIUM LASER APPLICATION Bilateral [id number]   Procedure: HOLMIUM LASER APPLICATION;  Surgeon: Hanley Ben, MD;  Location: [facility];  Service: Urology;  Laterality: Bilateral;  . LITHOTRIPSY Left ~ 06/2013  . LUMBAR EPIDURAL INJECTION     has had 7 injections  . MASTECTOMY COMPLETE / SIMPLE Right 07/28/2013  . MASTECTOMY W/ NODES PARTIAL Right 1997  . PORT-A-CATH REMOVAL Left 10/03/2014   Procedure: REMOVAL PORT-A-CATH;  Surgeon: Donnie Mesa, MD;  Location: [facility];  Service: General;  Laterality: Left;  . PORTACATH PLACEMENT Left 07/28/2013   Procedure: ATTEMPTED INSERTION PORT-A-CATH;  Surgeon: Imogene Burn. Georgette Dover, MD;  Location: [facility];  Service: General;  Laterality: Left;  . POSTERIOR LUMBAR FUSION 4 LEVEL N/A 04/25/2016   Procedure: LUMBAR FIVE-SACRAL ONE POSTERIOR LUMBAR INTERBODY FUSION, THORACIC NINE-SACRAL ONE POSTERIOR LATERAL ARTHRODESIS WITH PEDICLE SCREWS;  Surgeon: Earnie Larsson, MD;  Location: [facility];  Service: Neurosurgery;  Laterality: N/A;  . RETINAL DETACHMENT SURGERY Right 2013  . ROBOTIC ASSITED PARTIAL NEPHRECTOMY Right 02/05/2015   Procedure: ROBOTIC ASSITED PARTIAL NEPHRECTOMY;  Surgeon: Raynelle Bring, MD;  Location: [facility];  Service: Urology;  Laterality: Right;  . SIMPLE MASTECTOMY WITH AXILLARY SENTINEL NODE BIOPSY Right 07/28/2013   Procedure: RIGHT TOTAL  MASTECTOMY;  Surgeon: Imogene Burn. Georgette Dover, MD;  Location: [facility];  Service: General;  Laterality: Right;  . TONSILLECTOMY  AGE 3  . TOTAL ABDOMINAL  HYSTERECTOMY W/ BILATERAL SALPINGOOPHORECTOMY  1997  . TRANSTHORACIC ECHOCARDIOGRAM  05-11-2007  dr Ron Parker   normal lvf/  ef 65%      reports that she has never smoked. She has never used smokeless tobacco. She reports that she does not drink alcohol or use drugs. Social History   Social History  . Marital status: Married    Spouse name: N/A  . Number of children: 1  . Years of education: N/A   Occupational History  . homemaker    Social History Main Topics  . Smoking status: Never Smoker  . Smokeless tobacco: Never Used  . Alcohol use No     Comment: 07/28/2013 "no alcohol since 1994; never had problem w/it"  . Drug use: No  . Sexual activity: No     Comment: TAH/BSO   Other Topics Concern  . Not on file   Social History Narrative  . No narrative on file  Functional Status Survey:    Allergies  Allergen Reactions  . Lasix [Furosemide] Nausea And Vomiting and Other (See Comments)    HEADACHE  . Lithium Other (See Comments)    Dizziness, "cause me to fall"  . Sulfa Antibiotics Hives  . Trazodone And Nefazodone Other (See Comments)    insomnia  . Lyrica [Pregabalin] Diarrhea, Nausea Only and Rash    Pertinent  Health Maintenance Due  Topic Date Due  . PNA vac Low Risk Adult (1 of 2 - PCV13) 10/30/2010  . INFLUENZA VACCINE  07/25/2016 (Originally 03/25/2016)  . MAMMOGRAM  01/31/2018  . COLONOSCOPY  11/02/2024  . DEXA SCAN  Completed    Medications: Current Outpatient Prescriptions on File Prior to Visit  Medication Sig Dispense Refill  . ALPRAZolam (XANAX) 0.5 MG tablet Give 0.5 mg by mouth every 8 hours monitor for tearfulness, wringing  Of hands, shortness of breath    . amitriptyline (ELAVIL) 150 MG tablet Take 150 mg by mouth at bedtime.    Marland Kitchen anastrozole (ARIMIDEX) 1 MG tablet TAKE 1 TABLET (1 MG TOTAL) BY MOUTH DAILY. 90 tablet 1  . Apoaequorin (PREVAGEN) 10 MG CAPS Take 50 mg by mouth daily.    . Ascorbic Acid (VITAMIN C) 1000 MG tablet Take 1,000 mg by  mouth daily.    Marland Kitchen aspirin 81 MG tablet Take 81 mg by mouth daily.    . Cholecalciferol (VITAMIN D-3 PO) Take 1 capsule by mouth daily. Reported on 09/25/2015    . Cyanocobalamin 2500 MCG CHEW Chew 2,500 mcg by mouth daily.    Marland Kitchen gabapentin (NEURONTIN) 300 MG capsule TK 1 C PO QHS  4  . HYDROcodone-acetaminophen (NORCO) 7.5-325 MG tablet Take one to two tablets by mouth every 6 hours as needed for moderate pain. Max 3gm APAP/24hrs from all sources 240 tablet 0  . minoxidil (ROGAINE) 2 % external solution Apply 1 application topically 2 (two) times daily as needed (hair.).     Marland Kitchen Multiple Vitamins-Minerals (HAIR/SKIN/NAILS PO) Take 1 tablet by mouth daily.    . Omega-3 Fatty Acids (FISH OIL) 1200 MG CAPS Take 1,200 mg by mouth daily.    Marland Kitchen omeprazole (PRILOSEC) 40 MG capsule Take 1 capsule (40 mg total) by mouth every morning. 90 capsule 1  . REXULTI 2 MG TABS Take 2 mg by mouth at bedtime.   0  . VIIBRYD 40 MG TABS Take 40 mg by mouth daily.   1  . vitamin E 100 UNIT capsule Take 100 Units by mouth daily.     No current facility-administered medications on file prior to visit.      Review of Systems  Constitutional: Negative.   HENT: Negative.   Respiratory: Negative.   Cardiovascular: Negative.   Gastrointestinal: Negative.   Musculoskeletal: Positive for back pain. Negative for gait problem, joint swelling, myalgias, neck pain and neck stiffness.    Vitals:   05/26/16 0944  BP: 107/73  Pulse: (!) 101  Resp: 20  Temp: 98 F (36.7 C)  TempSrc: Oral   There is no height or weight on file to calculate BMI. Physical Exam  Constitutional: She is oriented to person, place, and time. She appears well-developed and well-nourished.  HENT:  Head: Normocephalic.  Mouth/Throat: Oropharynx is clear and moist.  Neck: Neck supple.  Cardiovascular: Normal rate, regular rhythm and normal heart sounds.   Pulmonary/Chest: Breath sounds normal. No respiratory distress. She has no wheezes. She has  no rales.  Abdominal: Soft. Bowel sounds are  normal. She exhibits no distension. There is no tenderness. There is no rebound and no guarding.  Neurological: She is alert and oriented to person, place, and time.    Labs reviewed: Basic Metabolic Panel:  Recent Labs  04/17/16 1203  04/25/16 1245 04/26/16 1005 05/05/16 0620  NA 139  < > 141 138 142  K 4.9  < > 4.7 4.8 4.2  CL 107  --   --  115* 108  CO2 24  --   --  17* 27  GLUCOSE 94  --  148* 127* 100*  BUN 17  --   --  21* 13  CREATININE 1.15*  --   --  1.61* 0.95  CALCIUM 9.9  --   --  6.9* 8.1*  < > = values in this interval not displayed. Liver Function Tests:  Recent Labs  11/09/15 1327 03/10/16 1222  AST 40* 50*  ALT 68* 55  ALKPHOS 97 82  BILITOT <0.30 <0.30  PROT 7.3 6.9  ALBUMIN 4.3 4.2   No results for input(s): LIPASE, AMYLASE in the last 8760 hours. No results for input(s): AMMONIA in the last 8760 hours. CBC:  Recent Labs  04/17/16 1203  04/27/16 0455 05/05/16 0620 05/07/16 0730  WBC 31.3*  < > 38.5* 31.6* 38.2*  NEUTROABS 2.8  --  6.5 5.4  --   HGB 11.2*  < > 8.0* 7.0* 8.1*  HCT 36.2  < > 25.4* 22.8* 26.7*  MCV 85.2  < > 87.9 88.7 90.5  PLT 289  < > 102* 200 296  < > = values in this interval not displayed. Cardiac Enzymes: No results for input(s): CKTOTAL, CKMB, CKMBINDEX, TROPONINI in the last 8760 hours. BNP: Invalid input(s): POCBNP CBG: No results for input(s): GLUCAP in the last 8760 hours.  Procedures and Imaging Studies During Stay: No results found.  Assessment/Plan:   Degenerative spondylolisthesis Status Post Thoracic and Lumbar Decompression and Fusion. Patient Doing well. Has follow up with her Neurosurgeon. Will Continue PRN Hydrocodone and Flexeril   Anemia Last Hgb in facility was 8.1. Repeat CBC in 1 week. Continue Iron Supplement.  Essential hypertension Blood Pressure well controlled.  Depression Doing well Continue her  Antidepressant  Hypothyroidism Follow up TSH as Synthroid was recently increased.  CLL. WBC was stable .  Marland Kitchen   Patient is being discharged with the following home health services:    Patient is being discharged with the following durable medical equipment:    Patient has been advised to f/u with their PCP in 1-2 weeks to bring them up to date on their rehab stay.  Social services at facility was responsible for arranging this appointment.  Pt was provided with a 30 day supply of prescriptions for medications and refills must be obtained from their PCP.  For controlled substances, a more limited supply may be provided adequate until PCP appointment only.  Future labs/tests needed:

## 2016-05-28 ENCOUNTER — Other Ambulatory Visit: Payer: Self-pay | Admitting: Oncology

## 2016-05-28 DIAGNOSIS — C9110 Chronic lymphocytic leukemia of B-cell type not having achieved remission: Secondary | ICD-10-CM

## 2016-05-28 DIAGNOSIS — C50011 Malignant neoplasm of nipple and areola, right female breast: Secondary | ICD-10-CM

## 2016-05-30 ENCOUNTER — Ambulatory Visit: Payer: Medicare Other

## 2016-06-04 ENCOUNTER — Other Ambulatory Visit: Payer: Self-pay | Admitting: Oncology

## 2016-06-04 DIAGNOSIS — C50011 Malignant neoplasm of nipple and areola, right female breast: Secondary | ICD-10-CM

## 2016-06-04 DIAGNOSIS — C9110 Chronic lymphocytic leukemia of B-cell type not having achieved remission: Secondary | ICD-10-CM

## 2016-06-04 NOTE — Telephone Encounter (Signed)
Chart reviewed.

## 2016-06-11 DIAGNOSIS — M48061 Spinal stenosis, lumbar region without neurogenic claudication: Secondary | ICD-10-CM | POA: Diagnosis not present

## 2016-06-16 ENCOUNTER — Encounter (HOSPITAL_COMMUNITY)
Admission: RE | Admit: 2016-06-16 | Discharge: 2016-06-16 | Disposition: A | Discharge status: Home / Self Care | Payer: Medicare Other | Source: Skilled Nursing Facility | Attending: Internal Medicine | Admitting: Internal Medicine

## 2016-06-16 DIAGNOSIS — M4315 Spondylolisthesis, thoracolumbar region: Secondary | ICD-10-CM | POA: Insufficient documentation

## 2016-06-16 DIAGNOSIS — Z4789 Encounter for other orthopedic aftercare: Secondary | ICD-10-CM | POA: Insufficient documentation

## 2016-06-16 DIAGNOSIS — M797 Fibromyalgia: Secondary | ICD-10-CM | POA: Insufficient documentation

## 2016-06-16 DIAGNOSIS — C50919 Malignant neoplasm of unspecified site of unspecified female breast: Secondary | ICD-10-CM | POA: Insufficient documentation

## 2016-06-17 DIAGNOSIS — Z0000 Encounter for general adult medical examination without abnormal findings: Secondary | ICD-10-CM | POA: Diagnosis not present

## 2016-06-17 DIAGNOSIS — R945 Abnormal results of liver function studies: Secondary | ICD-10-CM | POA: Diagnosis not present

## 2016-06-17 DIAGNOSIS — R749 Abnormal serum enzyme level, unspecified: Secondary | ICD-10-CM | POA: Diagnosis not present

## 2016-06-24 ENCOUNTER — Telehealth: Payer: Self-pay

## 2016-06-24 NOTE — Telephone Encounter (Signed)
Returned call to pt re: vaccines.  I advised patient I did not see any record of administration of a pneumonia vaccine by [facility] on her immunization record.  Pt voiced understanding.

## 2016-07-03 ENCOUNTER — Ambulatory Visit (INDEPENDENT_AMBULATORY_CARE_PROVIDER_SITE_OTHER): Payer: Medicare Other | Admitting: Nurse Practitioner

## 2016-07-03 ENCOUNTER — Encounter: Payer: Self-pay | Admitting: Nurse Practitioner

## 2016-07-03 VITALS — BP 122/78 | HR 84 | Ht 64.0 in | Wt 186.0 lb

## 2016-07-03 DIAGNOSIS — R7989 Other specified abnormal findings of blood chemistry: Secondary | ICD-10-CM | POA: Diagnosis not present

## 2016-07-03 DIAGNOSIS — R945 Abnormal results of liver function studies: Principal | ICD-10-CM

## 2016-07-03 NOTE — Patient Instructions (Addendum)
You have been scheduled for an abdominal ultrasound at [facility] (1st floor of hospital) on 07/11/16 at 9 am. Please arrive 15 minutes prior to your appointment for registration. Make certain not to have anything to eat or drink 6 hours prior to your appointment. Should you need to reschedule your appointment, please contact radiology at [phone]. This test typically takes about 30 minutes to perform.  Your physician has requested that you go to the basement for the following lab work before your next appointment with Dr. Ardis Hughs.

## 2016-07-03 NOTE — Progress Notes (Addendum)
     Addendum:  Labs from PCP 2009 - ANA negative. HBs ag negative, HCV ab negative   HPI: Patient is a 70 year old female known to Dr. Ardis Hughs. She last saw him in Feb 2016 for diarrhea. Colonoscopy with random biopsies was negative. Patient is referred by PCP Dr. Deland Pretty,  for abnormal LFTs.   Recent labs reveal minimallly elevated transaminases. Patient has no known liver disase. No recent medication changes.   Patient's surgical history, family medical history, social history, medications and allergies were all reviewed in Epic    Ref. Range 04/26/2015 14:54 11/09/2015 13:27 03/10/2016 12:22  Alkaline Phosphatase Latest Ref Range: 40 - 150 U/L 83 97 82  AST Latest Ref Range: 5 - 34 U/L 31 40 (H) 50 (H)  ALT Latest Ref Range: 0 - 55 U/L 42 68 (H) 55  Total Bilirubin Latest Ref Range: 0.20 - 1.20 mg/dL 0.28 <0.30 <0.30    Labs 06/17/16 PCP office Alk phos 120 (39-117) ALT 35 AST 48 Triglycerides 519 GGT 61 Viral hepatitis negative per PCP notes.   Physical Exam: BP 122/78   Pulse 84   Ht '5\' 4"'$  (1.626 m)   Wt 186 lb (84.4 kg)   LMP 08/26/1995   BMI 31.93 kg/m   GENERAL: well developed female in NAD PSYCH: :Pleasant, cooperative, normal affect HEENT: Normocephalic, conjunctiva pink, mucous membranes moist, neck supple without masses CARDIAC:  RRR, no murmur heard, no peripheral edema PULM: Normal respiratory effort, lungs CTA bilaterally, no wheezing ABDOMEN:  soft, nontender, nondistended, no obvious masses, no hepatomegaly,  normal bowel sounds SKIN:  turgor, no lesions seen Musculoskeletal:  Normal muscle tone, normal strength NEURO: Alert and oriented x 3, no focal neurologic deficits   ASSESSMENT and PLAN:  70 year old female referred by PCP for abnormal LFTs. LFTs  minimally elevated since March 2017. She has had additional labs by PCP which is not in Epic but I am requesting.  Ultrasound Jun 2015 raises question of steatosis.  -repeat ultrasound of abdomen to  reassess steatosis.  -Will review previous labs and if there is a pattern of elevated LFTs, may call her back in to check for autoimmune  / genetic markers of liver disease after review of labs / ultrasound -Triglycerides elevated. We discussed importance of lipid management in non-alcoholic fatty liver disease. Is she candidate for medical therapy of hypertriglyceridemia? Will defer to PCP.   Hx of right breast cancer, s/p mastectomy, chemoxrt. On  Arimidex and followed by Dr. Jana Hakim.   Chronic lymphocytic leukemia, followed by Dr. Jana Hakim   Cc:  Deland Pretty, MD  Tye Savoy  07/03/2016, 10:20 AM

## 2016-07-08 DIAGNOSIS — Z23 Encounter for immunization: Secondary | ICD-10-CM | POA: Diagnosis not present

## 2016-07-08 DIAGNOSIS — Z0000 Encounter for general adult medical examination without abnormal findings: Secondary | ICD-10-CM | POA: Diagnosis not present

## 2016-07-08 NOTE — Progress Notes (Signed)
I agree with the above note, plan 

## 2016-07-11 ENCOUNTER — Ambulatory Visit (HOSPITAL_COMMUNITY): Payer: Medicare Other

## 2016-07-14 ENCOUNTER — Ambulatory Visit (HOSPITAL_COMMUNITY)
Admission: RE | Admit: 2016-07-14 | Discharge: 2016-07-14 | Disposition: A | Discharge status: Home / Self Care | Payer: Medicare Other | Source: Ambulatory Visit | Attending: Nurse Practitioner | Admitting: Nurse Practitioner

## 2016-07-14 DIAGNOSIS — R945 Abnormal results of liver function studies: Secondary | ICD-10-CM

## 2016-07-14 DIAGNOSIS — R161 Splenomegaly, not elsewhere classified: Secondary | ICD-10-CM | POA: Diagnosis not present

## 2016-07-14 DIAGNOSIS — R7989 Other specified abnormal findings of blood chemistry: Secondary | ICD-10-CM | POA: Diagnosis not present

## 2016-07-23 DIAGNOSIS — M48061 Spinal stenosis, lumbar region without neurogenic claudication: Secondary | ICD-10-CM | POA: Diagnosis not present

## 2016-07-28 ENCOUNTER — Telehealth: Payer: Self-pay | Admitting: Nurse Practitioner

## 2016-07-29 NOTE — Telephone Encounter (Signed)
Patient contacted. You can complete this message. You do not need to send it back to me.

## 2016-07-29 NOTE — Telephone Encounter (Signed)
Please review her abd u/s

## 2016-08-04 ENCOUNTER — Other Ambulatory Visit: Payer: Self-pay | Admitting: Oncology

## 2016-08-05 ENCOUNTER — Ambulatory Visit (HOSPITAL_COMMUNITY): Payer: Medicare Other | Attending: Neurosurgery

## 2016-08-05 DIAGNOSIS — M545 Low back pain: Secondary | ICD-10-CM | POA: Insufficient documentation

## 2016-08-05 DIAGNOSIS — M25552 Pain in left hip: Secondary | ICD-10-CM | POA: Diagnosis not present

## 2016-08-05 DIAGNOSIS — R2689 Other abnormalities of gait and mobility: Secondary | ICD-10-CM | POA: Insufficient documentation

## 2016-08-05 NOTE — Patient Instructions (Signed)
  CLAM SHELLS  While lying on your side with your knees bent, draw up the top knee while keeping contact of your feet together.  Do not let your pelvis roll back during the lifting movement.  2 x 10 reps   BRIDGING  While lying on your back, tighten your lower abdominals, squeeze your buttocks and then raise your buttocks off the floor/bed as creating a "Bridge" with your body. Hold and then lower yourself and repeat.   2 x 10 reps   Abdominal brace  Laying on your back with your knees bent, draw in your belly button as if putting on a tight pair of pants. It should feel like you are flattening or pushing your back into the table/bed.   2 x 10 reps

## 2016-08-06 DIAGNOSIS — M25552 Pain in left hip: Secondary | ICD-10-CM | POA: Diagnosis not present

## 2016-08-06 NOTE — Therapy (Signed)
[place], [place], [address] Phone: [phone]   Fax:  [phone]  Physical Therapy Evaluation  Patient Details  Name: [name] [name] MRN: [id number] Date of Birth: May 26, 1946 Referring Provider: Dr. Earnie Larsson  Encounter Date: 08/05/2016      PT End of Session - 08/06/16 1207    Visit Number 1   Number of Visits 12   Date for PT Re-Evaluation 08/19/16   Authorization Type [employer] Medicare   Authorization - Visit Number 1   Authorization - Number of Visits 12   PT Start Time [id number]   PT Stop Time 1427   PT Time Calculation (min) 45 min   Activity Tolerance Patient tolerated treatment well   Behavior During Therapy [facility] for tasks assessed/performed      Past Medical History:  Diagnosis Date  . Alopecia   . Anxiety   . Arthritis    "spine" (07/28/2013)  . Breast cancer ([place]) 1997; 2014   right  . CAP (community acquired pneumonia)    admission 06-04-2013 secondary to failed oral antibitotics---  last cxr 06-16-2013  much improved  . Chronic back pain    "lower back and upper neck" (07/28/2013)  . CLL (chronic lymphocytic leukemia) ([place]) dx'd 05/2013   "I don't think I have it though" (07/28/2013)  . DDD (degenerative disc disease)   . Deafness in left ear   . 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 ear   . Depression   . Diverticulosis   . Ecchymosis    FROM IV AND LAB WORK THE PAST WEEK  06-17-2013  . Ejection fraction   . Elevated LFTs   . Fibromyalgia 1978  . GERD (gastroesophageal reflux disease)   . Hematuria   . History of syncope    episode 2008--  no recurrence since  . HLD (hyperlipidemia)   . Hypothyroidism   . Kidney stones 2014  . Leukocytosis   . Orthostatic hypotension    slight in the past  . Plantar fasciitis   . Ruptured disk    one in neck and two in back  . S/P chemotherapy, time since greater than 12 weeks   . Sensation of pressure in bladder area   . Sinus tachycardia    mild resting  .  SOB (shortness of breath) 06-17-2013 CURRENTLY RESIDUAL FROM RECENT CAP   new since sore throats- 12/2014    . Stool incontinence    "at times recently"   . Ureteral calculi    bilateral  . Urgency of urination   . Wears glasses     Past Surgical History:  Procedure Laterality Date  . ABDOMINAL HYSTERECTOMY    . ANTERIOR LATERAL LUMBAR FUSION 4 LEVELS Left 04/25/2016   Procedure: LEFT LUMBAR ONE-TWO, LUMBAR TWO-THREE, LUMBAR THREE-FOUR, LUMBAR FOUR-FIVE ANTERIOR LATERAL LUMBAR FUSION;  Surgeon: Earnie Larsson, MD;  Location: [facility];  Service: Neurosurgery;  Laterality: Left;  . APPENDECTOMY  1997  . BREAST BIOPSY Right 2014  . BREAST LUMPECTOMY WITH AXILLARY LYMPH NODE DISSECTION Right 1997  . CYSTOSCOPY WITH RETROGRADE PYELOGRAM, URETEROSCOPY AND STENT PLACEMENT Bilateral 06/17/2013   Procedure: CYSTOSCOPY WITH RETROGRADE PYELOGRAM, URETEROSCOPY AND LEFT DOUBLE  J STENT PLACEMENT RIGHT URETERAL HOLMIIUM LASER AND DOUBLE J STENT ;  Surgeon: Hanley Ben, MD;  Location: [facility];  Service: Urology;  Laterality: Bilateral;  . CYSTOSCOPY WITH URETEROSCOPY, STONE BASKETRY AND STENT PLACEMENT Bilateral 2014  . HOLMIUM LASER APPLICATION Bilateral [id number]   Procedure: HOLMIUM LASER  APPLICATION;  Surgeon: Hanley Ben, MD;  Location: [facility];  Service: Urology;  Laterality: Bilateral;  . LITHOTRIPSY Left ~ 06/2013  . LUMBAR EPIDURAL INJECTION     has had 7 injections  . MASTECTOMY COMPLETE / SIMPLE Right 07/28/2013  . MASTECTOMY W/ NODES PARTIAL Right 1997  . PORT-A-CATH REMOVAL Left 10/03/2014   Procedure: REMOVAL PORT-A-CATH;  Surgeon: Donnie Mesa, MD;  Location: [facility];  Service: General;  Laterality: Left;  . PORTACATH PLACEMENT Left 07/28/2013   Procedure: ATTEMPTED INSERTION PORT-A-CATH;  Surgeon: Imogene Burn. Georgette Dover, MD;  Location: [facility];  Service: General;  Laterality: Left;  . POSTERIOR LUMBAR FUSION 4 LEVEL N/A 04/25/2016    Procedure: LUMBAR FIVE-SACRAL ONE POSTERIOR LUMBAR INTERBODY FUSION, THORACIC NINE-SACRAL ONE POSTERIOR LATERAL ARTHRODESIS WITH PEDICLE SCREWS;  Surgeon: Earnie Larsson, MD;  Location: [place] [facility];  Service: Neurosurgery;  Laterality: N/A;  . RETINAL DETACHMENT SURGERY Right 2013  . ROBOTIC ASSITED PARTIAL NEPHRECTOMY Right 02/05/2015   Procedure: ROBOTIC ASSITED PARTIAL NEPHRECTOMY;  Surgeon: Raynelle Bring, MD;  Location: [facility];  Service: Urology;  Laterality: Right;  . SIMPLE MASTECTOMY WITH AXILLARY SENTINEL NODE BIOPSY Right 07/28/2013   Procedure: RIGHT TOTAL  MASTECTOMY;  Surgeon: Imogene Burn. Georgette Dover, MD;  Location: [facility];  Service: General;  Laterality: Right;  . TONSILLECTOMY  AGE 44  . TOTAL ABDOMINAL HYSTERECTOMY W/ BILATERAL SALPINGOOPHORECTOMY  1997  . TRANSTHORACIC ECHOCARDIOGRAM  05-11-2007  dr Ron Parker   normal lvf/  ef 65%    There were no vitals filed for this visit.       Subjective Assessment - 08/05/16 1346    Subjective Pt had back surgery on Sept 1st, 2017.  "They rebuilt my back."  After surgery, pt was d/c to [facility] for 3 weeks.  After she was discharged from [facility] center, she went home, and has been getting on a stationary bike doing and HEP at home since then.  Pt states she does not have to wear the brace anymore - per last MD visit.  She sometimes wears it because it helps to relieve the pain.    Pt has been doing seated marches, LAQ's, hip abduction with resistance band, ER with resistance band, bicycle.     Pertinent History DDD, R breast CA s/p mastectomy, mild sinus tachycardia - resting.    Limitations Lifting  no bending, lifting, twisting.    How long can you sit comfortably? unlimited    How long can you stand comfortably? less than 5 minutes.     How long can you walk comfortably? less than 5 minutes.  Hips also begin to give out.     Patient Stated Goals Relieve pain.     Currently in Pain? 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 Earnie Larsson   Onset Date/Surgical Date 04/25/16   Next MD Visit 09/04/2015  with Dr. Earnie Larsson   Prior Therapy none before surgery.      Precautions   Precautions Back  Pt no longer has to wear her LSO.     Observation/Other Assessments   Focus on Therapeutic Outcomes (FOTO)  43  Patient's Physical FS primary measure 29   Fear Avoidance Belief Questionnaire (FABQ)  66(18)     Strength   Right Hip Flexion 4/5  Right Hip Extension 3/5   Right Hip ABduction 3/5   Left Hip Flexion 4/5   Left Hip Extension 3/5  with some pain in hip joint   Left Hip ABduction 3/5  with some pain in hip joint   Right Knee Flexion 3/5   Right Knee Extension 4/5   Left Knee Flexion 3/5  with some pain in L hip joint   Left Knee Extension 4/5     Ambulation/Gait   Gait Comments TUG: 8.39     6 minute walk test results    Endurance additional comments 2 min: 373ft, gait speed: 2.54ft/sec limited due to hip pain                   [facility] Adult PT Treatment/Exercise - 08/06/16 1206      Lumbar Exercises: Stretches   Piriformis Stretch 2 reps;30 seconds  seated     Lumbar Exercises: Standing   Other Standing Lumbar Exercises mini squat with B UE supported.  2 x 10 reps     Lumbar Exercises: Supine   Ab Set 10 reps   Bridge 10 reps  x 2 sets     Lumbar Exercises: Sidelying   Clam 10 reps  x2 sets bilaterally                 PT Education - 08/05/16 1519    Education provided Yes   Education Details Importance of core and LE strength to help support the spine and skeletal system.  Also discussed importance of good body mechanics.   Person(s) Educated Patient   Methods Explanation;Handout   Comprehension Verbalized understanding;Returned demonstration;Need further instruction          PT Short Term Goals - 08/06/16 1207      PT SHORT TERM GOAL  #1   Title Pt will express an average of <4/10 pain to improve functional mobility, endurance, and quality of life.     Time 3   Period Weeks   Status New     PT SHORT TERM GOAL #2   Title Pt will demonstrate improved LE strength to at least 4/5 in all major muscle groups to improve pt's activity tolerance, standing tolerance, and gait duration.     Time 3   Period Weeks   Status New     PT SHORT TERM GOAL #3   Title Pt will be able to tolerate ambulating for at least 30 minutes at a time to improve re-integration into the community.     Time 3   Period Weeks   Status New     PT SHORT TERM GOAL #4   Title Pt will demonstrate gait speed of at least 2.97ft/sec to demonstrate appropriate speed for safe community ambulation with reduced risk of falls.     Time 3   Period Weeks   Status New           PT Long Term Goals - 08/06/16 1212      PT LONG TERM GOAL #1   Title Pt will express average pain at <2/10 to improve pt's functional mobility and quality of life.     Time 6   Period Weeks   Status New     PT LONG TERM GOAL #2   Title Pt will demonstrate correct functional lifting techniques to reduce strain on spine as well as pain with this activity.   Time 6   Period Weeks   Status New     PT  LONG TERM GOAL #3   Title Pt will express being able to tolerate ambulation for 45 min to improve endurance and be able to perform grocery shopping and other community activites.     Time 6   Period Weeks   Status New     PT LONG TERM GOAL #4   Title Pt will demonstrate 4+/5 in all LE major muscle groups to improve pt's quality of gait, reduce pain and improve endurance for functional mobiltiy tasks.    Time 6   Period Weeks   Status New               Plan - Aug 17, 2016 03-Dec-1204    Clinical Impression Statement On 04/25/2016, pt underwent a major back surgery that included the following: Left L1-2, L2-3, L3-4, L4-5 anterior lateral interbody decompression and fusion with  interbody peek cage, morcellized allograft. L5-S1 posterior lumbar interbody fusion utilizing expandable cages and local autograft. T9-S1 posterior lateral arthrodesis utilizing segmental pedicle screw fixation, local autograft, and morselized allograft. During PT evaluation she ambulated into the office with no DME. She expressed that she was not in pain at the start of the evaluation, however some of the testing did increased her level of discomfort. She demonstrates decreased general endurances, as well as decreased core and LE strength. Pt would benefit from continued OPPT to optimize her mobility and return to PLOF.    Rehab Potential Good   PT Frequency 2x / week   PT Duration 6 weeks   PT Treatment/Interventions ADLs/Self Care Home Management;Cryotherapy;Electrical Stimulation;Moist Heat;Gait training;Stair training;Functional mobility training;Therapeutic activities;Therapeutic exercise;Balance training;Neuromuscular re-education;Patient/family education;Manual techniques;Scar mobilization;Passive range of motion;Dry needling;Energy conservation;Taping   PT Next Visit Plan Review POC, review HEP   PT Home Exercise Plan Eval 12/12: Clams, Bridge, and Ab brace   Consulted and Agree with Plan of Care Patient      Patient will benefit from skilled therapeutic intervention in order to improve the following deficits and impairments:  Abnormal gait, Decreased activity tolerance, Decreased balance, Decreased endurance, Decreased mobility, Decreased range of motion, Decreased strength, Decreased scar mobility, Difficulty walking, Impaired flexibility, Postural dysfunction, Improper body mechanics, Hypomobility, Obesity, Pain  Visit Diagnosis: Pain in left hip - Plan: PT plan of care cert/re-cert  Other abnormalities of gait and mobility - Plan: PT plan of care cert/re-cert  Acute left-sided low back pain, with sciatica presence unspecified - Plan: PT plan of care cert/re-cert      G-Codes -  2016/08/17 Dec 03, 1220    Functional Assessment Tool Used Clinical judgement and FOTO   Functional Limitation Mobility: Walking and moving around   Mobility: Walking and Moving Around Current Status [phone]) At least 60 percent but less than 80 percent impaired, limited or restricted   Mobility: Walking and Moving Around Goal Status [phone]) At least 40 percent but less than 60 percent impaired, limited or restricted       Problem List Patient Active Problem List   Diagnosis Date Noted  . Status post lumbar spine operative procedure for decompression of spinal cord 05/26/2016  . Depression 05/05/2016  . Degenerative spondylolisthesis 04/25/2016  . Spasm of muscle, back 04/25/2016  . Genetic testing 11/30/2015  . Essential hypertension 10/18/2015  . Morbid obesity ([place]) 10/18/2015  . Upper airway cough syndrome 10/17/2015  . Renal mass 02/05/2015  . Anemia 10/25/2013  . GERD (gastroesophageal reflux disease) 09/27/2013  . CLL (chronic lymphocytic leukemia) ([place]) 07/27/2013  . Breast cancer of lower-outer quadrant of right female breast ([place]) 07/26/2013  .  Nephrolithiasis 06/16/2013  . 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 Chronic pain syndrome 05/10/2013  . DDD (degenerative disc disease) 05/10/2013  . SOB (shortness of breath)   . Sinus tachycardia   . HLD (hyperlipidemia)   . Orthostatic hypotension   . Anxiety   . Arthritis   . Diverticulosis   . Fibromyalgia   . Hypothyroidism     Beth Jaleeah Slight, PT, DPT X: [phone]   [facility] [address] [place], [place], [address] Phone: [phone]   Fax:  [phone]  Name: [name] [name] MRN: [id number] Date of Birth: 1946-02-28

## 2016-08-07 ENCOUNTER — Ambulatory Visit (HOSPITAL_COMMUNITY): Payer: Medicare Other | Admitting: Physical Therapy

## 2016-08-07 DIAGNOSIS — R2689 Other abnormalities of gait and mobility: Secondary | ICD-10-CM

## 2016-08-07 DIAGNOSIS — M25552 Pain in left hip: Secondary | ICD-10-CM

## 2016-08-07 DIAGNOSIS — M545 Low back pain: Secondary | ICD-10-CM

## 2016-08-07 NOTE — Therapy (Signed)
[place], [place], [address] Phone: [phone]   Fax:  [phone]  Physical Therapy Treatment  Patient Details  Name: [name] [name] MRN: [id number] Date of Birth: [birth year] Referring Provider: Dr. Earnie Larsson  Encounter Date: 08/07/2016      PT End of Session - 08/07/16 1157    Visit Number 2   Number of Visits 12   Date for PT Re-Evaluation 08/19/16   Authorization Type [employer] Medicare   Authorization - Visit Number 2   Authorization - Number of Visits 12   PT Start Time 1120   PT Stop Time 1205   PT Time Calculation (min) 45 min   Activity Tolerance Patient tolerated treatment well   Behavior During Therapy [facility] for tasks assessed/performed      Past Medical History:  Diagnosis Date  . Alopecia   . Anxiety   . Arthritis    "spine" (07/28/2013)  . Breast cancer ([place]) 1997; 2014   right  . CAP (community acquired pneumonia)    admission 06-04-2013 secondary to failed oral antibitotics---  last cxr 06-16-2013  much improved  . Chronic back pain    "lower back and upper neck" (07/28/2013)  . CLL (chronic lymphocytic leukemia) ([place]) dx'd 05/2013   "I don't think I have it though" (07/28/2013)  . DDD (degenerative disc disease)   . Deafness in left ear   . 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 ear   . Depression   . Diverticulosis   . Ecchymosis    FROM IV AND LAB WORK THE PAST WEEK  06-17-2013  . Ejection fraction   . Elevated LFTs   . Fibromyalgia 1978  . GERD (gastroesophageal reflux disease)   . Hematuria   . History of syncope    episode 2008--  no recurrence since  . HLD (hyperlipidemia)   . Hypothyroidism   . Kidney stones 2014  . Leukocytosis   . Orthostatic hypotension    slight in the past  . Plantar fasciitis   . Ruptured disk    one in neck and two in back  . S/P chemotherapy, time since greater than 12 weeks   . Sensation of pressure in bladder area   . Sinus tachycardia    mild resting  .  SOB (shortness of breath) 06-17-2013 CURRENTLY RESIDUAL FROM RECENT CAP   new since sore throats- 12/2014    . Stool incontinence    "at times recently"   . Ureteral calculi    bilateral  . Urgency of urination   . Wears glasses     Past Surgical History:  Procedure Laterality Date  . ABDOMINAL HYSTERECTOMY    . ANTERIOR LATERAL LUMBAR FUSION 4 LEVELS Left 04/25/2016   Procedure: LEFT LUMBAR ONE-TWO, LUMBAR TWO-THREE, LUMBAR THREE-FOUR, LUMBAR FOUR-FIVE ANTERIOR LATERAL LUMBAR FUSION;  Surgeon: Earnie Larsson, MD;  Location: [place] [facility];  Service: Neurosurgery;  Laterality: Left;  . APPENDECTOMY  1997  . BREAST BIOPSY Right 2014  . BREAST LUMPECTOMY WITH AXILLARY LYMPH NODE DISSECTION Right 1997  . CYSTOSCOPY WITH RETROGRADE PYELOGRAM, URETEROSCOPY AND STENT PLACEMENT Bilateral 06/17/2013   Procedure: CYSTOSCOPY WITH RETROGRADE PYELOGRAM, URETEROSCOPY AND LEFT DOUBLE  J STENT PLACEMENT RIGHT URETERAL HOLMIIUM LASER AND DOUBLE J STENT ;  Surgeon: Hanley Ben, MD;  Location: [place];  Service: Urology;  Laterality: Bilateral;  . CYSTOSCOPY WITH URETEROSCOPY, STONE BASKETRY AND STENT PLACEMENT Bilateral 2014  . HOLMIUM LASER APPLICATION Bilateral [id number]   Procedure: HOLMIUM LASER  APPLICATION;  Surgeon: Hanley Ben, MD;  Location: [facility];  Service: Urology;  Laterality: Bilateral;  . LITHOTRIPSY Left ~ 06/2013  . LUMBAR EPIDURAL INJECTION     has had 7 injections  . MASTECTOMY COMPLETE / SIMPLE Right 07/28/2013  . MASTECTOMY W/ NODES PARTIAL Right 1997  . PORT-A-CATH REMOVAL Left 10/03/2014   Procedure: REMOVAL PORT-A-CATH;  Surgeon: Donnie Mesa, MD;  Location: [place];  Service: General;  Laterality: Left;  . PORTACATH PLACEMENT Left 07/28/2013   Procedure: ATTEMPTED INSERTION PORT-A-CATH;  Surgeon: Imogene Burn. Georgette [place], MD;  Location: [place];  Service: General;  Laterality: Left;  . POSTERIOR LUMBAR FUSION 4 LEVEL N/A 04/25/2016    Procedure: LUMBAR FIVE-SACRAL ONE POSTERIOR LUMBAR INTERBODY FUSION, THORACIC NINE-SACRAL ONE POSTERIOR LATERAL ARTHRODESIS WITH PEDICLE SCREWS;  Surgeon: Earnie Larsson, MD;  Location: [place] [facility];  Service: Neurosurgery;  Laterality: N/A;  . RETINAL DETACHMENT SURGERY Right 2013  . ROBOTIC ASSITED PARTIAL NEPHRECTOMY Right 02/05/2015   Procedure: ROBOTIC ASSITED PARTIAL NEPHRECTOMY;  Surgeon: Raynelle Bring, MD;  [facility];  Service: Urology;  Laterality: Right;  . SIMPLE MASTECTOMY WITH AXILLARY SENTINEL NODE BIOPSY Right 07/28/2013   Procedure: RIGHT TOTAL  MASTECTOMY;  Surgeon: Imogene Burn. Georgette [place], MD;  Location: [place];  Service: General;  Laterality: Right;  . TONSILLECTOMY  AGE 70  . TOTAL ABDOMINAL HYSTERECTOMY W/ BILATERAL SALPINGOOPHORECTOMY  1997  . TRANSTHORACIC ECHOCARDIOGRAM  05-11-2007  dr Ron Parker   normal lvf/  ef 65%    There were no vitals filed for this visit.      Subjective Assessment - 08/07/16 1129    Subjective Pt states she has been completing her HEP.  States her pain is intermittent and is located in central LB.  also having some pain in her hips, Lt>Rt and Dr. Trenton Gammon thinks may be related to back (according to patient) but she is wondering if she should see an orthopedist.   Pertinent History Pt had back surgery on Sept 1st, 2017.  "They rebuilt my back."  After surgery, pt was d/c to [facility] for 3 weeks.  After she was discharged from [facility] center, she went home, and has been getting on a stationary bike doing and HEP at home since then.  Pt states she does not have to wear the brace anymore - per last MD visit.  She sometimes wears it because it helps to relieve the pain.    Pt has been doing seated marches, LAQ's, hip abduction with resistance band, ER with resistance band, bicycle.  history includes DDD, Rt breast cancer s/p mastectomy, sinus tachycardia   Currently in Pain? No/denies                       [facility] Adult PT Treatment/Exercise -  08/07/16 0001      Lumbar Exercises: Stretches   Piriformis Stretch 2 reps;30 seconds     Lumbar Exercises: Supine   Ab Set 10 reps   Bridge 10 reps     Lumbar Exercises: Sidelying   Clam 10 reps                PT Education - 08/07/16 1156    Education provided Yes   Education Details reviewed HEP and goals per initial evaluation.  Educated on logroll techniques for mobility and posture   Person(s) Educated Patient   Methods Explanation;Demonstration;Tactile cues;Verbal cues;Handout   Comprehension Verbalized understanding;Returned demonstration;Verbal cues required;Tactile cues required;Need further instruction  PT Short Term Goals - 08/06/16 1207      PT SHORT TERM GOAL #1   Title Pt will express an average of <4/10 pain to improve functional mobility, endurance, and quality of life.     Time 3   Period Weeks   Status New     PT SHORT TERM GOAL #2   Title Pt will demonstrate improved LE strength to at least 4/5 in all major muscle groups to improve pt's activity tolerance, standing tolerance, and gait duration.     Time 3   Period Weeks   Status New     PT SHORT TERM GOAL #3   Title Pt will be able to tolerate ambulating for at least 30 minutes at a time to improve re-integration into the community.     Time 3   Period Weeks   Status New     PT SHORT TERM GOAL #4   Title Pt will demonstrate gait speed of at least 2.36ft/sec to demonstrate appropriate speed for safe community ambulation with reduced risk of falls.     Time 3   Period Weeks   Status New           PT Long Term Goals - 08/06/16 1212      PT LONG TERM GOAL #1   Title Pt will express average pain at <2/10 to improve pt's functional mobility and quality of life.     Time 6   Period Weeks   Status New     PT LONG TERM GOAL #2   Title Pt will demonstrate correct functional lifting techniques to reduce strain on spine as well as pain with this activity.   Time 6   Period  Weeks   Status New     PT LONG TERM GOAL #3   Title Pt will express being able to tolerate ambulation for 45 min to improve endurance and be able to perform grocery shopping and other community activites.     Time 6   Period Weeks   Status New     PT LONG TERM GOAL #4   Title Pt will demonstrate 4+/5 in all LE major muscle groups to improve pt's quality of gait, reduce pain and improve endurance for functional mobiltiy tasks.    Time 6   Period Weeks   Status New               Plan - 08/07/16 1158    Clinical Impression Statement reviewed HEP and goals per intial evaluation.  Pt able to complete exercises with minimal cues, most difficulty with clams in sidelying.  Good performance with abdominal sets without holding breath.  Minimal height achieved with bridging but good glute contraction obtained.  worked on bed mobility, completing proper logroll technique with good results.  No return of pain or complaints during session.     Rehab Potential Good   PT Frequency 2x / week   PT Duration 6 weeks   PT Treatment/Interventions ADLs/Self Care Home Management;Cryotherapy;Electrical Stimulation;Moist Heat;Gait training;Stair training;Functional mobility training;Therapeutic activities;Therapeutic exercise;Balance training;Neuromuscular re-education;Patient/family education;Manual techniques;Scar mobilization;Passive range of motion;Dry needling;Energy conservation;Taping   PT Next Visit Plan Progress lumbar stability, LE stretching and postural strengthening.     PT Home Exercise Plan Eval 12/12: Clams, Bridge, and Ab brace   Consulted and Agree with Plan of Care Patient      Patient will benefit from skilled therapeutic intervention in order to improve the following deficits and impairments:  Abnormal gait, Decreased activity  tolerance, Decreased balance, Decreased endurance, Decreased mobility, Decreased range of motion, Decreased strength, Decreased scar mobility, Difficulty  walking, Impaired flexibility, Postural dysfunction, Improper body mechanics, Hypomobility, Obesity, Pain  Visit Diagnosis: Pain in left hip  Other abnormalities of gait and mobility  Acute left-sided low back pain, with sciatica presence unspecified       G-Codes - 09/01/16 1222    Functional Assessment Tool Used Clinical judgement and FOTO   Functional Limitation Mobility: Walking and moving around   Mobility: Walking and Moving Around Current Status [phone]) At least 60 percent but less than 80 percent impaired, limited or restricted   Mobility: Walking and Moving Around Goal Status [phone]) At least 40 percent but less than 60 percent impaired, limited or restricted      Problem List Patient Active Problem List   Diagnosis Date Noted  . Status post lumbar spine operative procedure for decompression of spinal cord 05/26/2016  . Depression 05/05/2016  . Degenerative spondylolisthesis 04/25/2016  . Spasm of muscle, back 04/25/2016  . Genetic testing 11/30/2015  . Essential hypertension 10/18/2015  . Morbid obesity ([place]) 10/18/2015  . Upper airway cough syndrome 10/17/2015  . Renal mass 02/05/2015  . Anemia 10/25/2013  . GERD (gastroesophageal reflux disease) 09/27/2013  . CLL (chronic lymphocytic leukemia) ([place]) 07/27/2013  . Breast cancer of lower-outer quadrant of right female breast ([place]) 07/26/2013  . Nephrolithiasis 06/16/2013  . 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 Chronic pain syndrome 05/10/2013  . DDD (degenerative disc disease) 05/10/2013  . SOB (shortness of breath)   . Sinus tachycardia   . HLD (hyperlipidemia)   . Orthostatic hypotension   . Anxiety   . Arthritis   . Diverticulosis   . Fibromyalgia   . Hypothyroidism     Teena Irani, PTA/CLT [phone]  08/07/2016, 12:08 PM  [facility] [address] [place], [place], [address] Phone: [phone]   Fax:  [phone]  Name: [name] [name] MRN:  [id number] Date of Birth: [birth year]

## 2016-08-13 ENCOUNTER — Other Ambulatory Visit: Payer: Self-pay

## 2016-08-20 ENCOUNTER — Other Ambulatory Visit: Payer: Self-pay | Admitting: Nurse Practitioner

## 2016-08-26 ENCOUNTER — Ambulatory Visit (HOSPITAL_COMMUNITY): Payer: Medicare Other | Attending: Neurosurgery

## 2016-08-26 DIAGNOSIS — M25552 Pain in left hip: Secondary | ICD-10-CM | POA: Insufficient documentation

## 2016-08-26 DIAGNOSIS — R2689 Other abnormalities of gait and mobility: Secondary | ICD-10-CM | POA: Insufficient documentation

## 2016-08-26 DIAGNOSIS — M545 Low back pain: Secondary | ICD-10-CM

## 2016-08-26 NOTE — Therapy (Signed)
[place], [place], [address] Phone: [phone]   Fax:  [phone]  Physical Therapy Treatment  Patient Details  Name: [name] [name] MRN: [id number] Date of Birth: [birth year] Referring Provider: Dr. Earnie Larsson  Encounter Date: 08/26/2016      PT End of Session - 08/26/16 1101    Visit Number 3   Number of Visits [place] [employer]   Authorization - Visit Number 3   Authorization - Number of Visits 12   PT Start Time [id number]   PT Stop Time 1120   PT Time Calculation (min) 40 min   Activity Tolerance Patient tolerated treatment well;No increased pain   Behavior During Therapy [facility] for tasks assessed/performed      Past Medical History:  Diagnosis Date  . Alopecia   . Anxiety   . Arthritis    "spine" (07/28/2013)  . Breast cancer ([place]) 1997; 2014   right  . CAP (community acquired pneumonia)    admission 06-04-2013 secondary to failed oral antibitotics---  last cxr 06-16-2013  much improved  . Chronic back pain    "lower back and upper neck" (07/28/2013)  . CLL (chronic lymphocytic leukemia) ([place]) dx'd 05/2013   "I don't think I have it though" (07/28/2013)  . DDD (degenerative disc disease)   . Deafness in left ear   . 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 ear   . Depression   . Diverticulosis   . Ecchymosis    FROM IV AND LAB WORK THE PAST WEEK  06-17-2013  . Ejection fraction   . Elevated LFTs   . Fibromyalgia 1978  . GERD (gastroesophageal reflux disease)   . Hematuria   . History of syncope    episode 2008--  no recurrence since  . HLD (hyperlipidemia)   . Hypothyroidism   . Kidney stones 2014  . Leukocytosis   . Orthostatic hypotension    slight in the past  . Plantar fasciitis   . Ruptured disk    one in neck and two in back  . S/P chemotherapy, time since greater than 12 weeks   . Sensation of pressure in bladder area   . Sinus tachycardia    mild resting  . SOB (shortness of  breath) 06-17-2013 CURRENTLY RESIDUAL FROM RECENT CAP   new since sore throats- 12/2014    . Stool incontinence    "at times recently"   . Ureteral calculi    bilateral  . Urgency of urination   . Wears glasses     Past Surgical History:  Procedure Laterality Date  . ABDOMINAL HYSTERECTOMY    . ANTERIOR LATERAL LUMBAR FUSION 4 LEVELS Left 04/25/2016   Procedure: LEFT LUMBAR ONE-TWO, LUMBAR TWO-THREE, LUMBAR THREE-FOUR, LUMBAR FOUR-FIVE ANTERIOR LATERAL LUMBAR FUSION;  Surgeon: Earnie Larsson, MD;  Location: [place] [facility];  Service: Neurosurgery;  Laterality: Left;  . APPENDECTOMY  1997  . BREAST BIOPSY Right 2014  . BREAST LUMPECTOMY WITH AXILLARY LYMPH NODE DISSECTION Right 1997  . CYSTOSCOPY WITH RETROGRADE PYELOGRAM, URETEROSCOPY AND STENT PLACEMENT Bilateral 06/17/2013   Procedure: CYSTOSCOPY WITH RETROGRADE PYELOGRAM, URETEROSCOPY AND LEFT DOUBLE  J STENT PLACEMENT RIGHT URETERAL HOLMIIUM LASER AND DOUBLE J STENT ;  Surgeon: Hanley Ben, MD;  Location: [place];  Service: Urology;  Laterality: Bilateral;  . CYSTOSCOPY WITH URETEROSCOPY, STONE BASKETRY AND STENT PLACEMENT Bilateral 2014  . HOLMIUM LASER APPLICATION Bilateral [id number]   Procedure: HOLMIUM LASER APPLICATION;  Surgeon: Hanley Ben,  MD;  Location: [place];  Service: Urology;  Laterality: Bilateral;  . LITHOTRIPSY Left ~ 06/2013  . LUMBAR EPIDURAL INJECTION     has had 7 injections  . MASTECTOMY COMPLETE / SIMPLE Right 07/28/2013  . MASTECTOMY W/ NODES PARTIAL Right 1997  . PORT-A-CATH REMOVAL Left 10/03/2014   Procedure: REMOVAL PORT-A-CATH;  Surgeon: Donnie Mesa, MD;  Location: [place];  Service: General;  Laterality: Left;  . PORTACATH PLACEMENT Left 07/28/2013   Procedure: ATTEMPTED INSERTION PORT-A-CATH;  Surgeon: Imogene Burn. Georgette [place], MD;  Location: [place];  Service: General;  Laterality: Left;  . POSTERIOR LUMBAR FUSION 4 LEVEL N/A 04/25/2016   Procedure: LUMBAR  FIVE-SACRAL ONE POSTERIOR LUMBAR INTERBODY FUSION, THORACIC NINE-SACRAL ONE POSTERIOR LATERAL ARTHRODESIS WITH PEDICLE SCREWS;  Surgeon: Earnie Larsson, MD;  Location: [place] [facility];  Service: Neurosurgery;  Laterality: N/A;  . RETINAL DETACHMENT SURGERY Right 2013  . ROBOTIC ASSITED PARTIAL NEPHRECTOMY Right 02/05/2015   Procedure: ROBOTIC ASSITED PARTIAL NEPHRECTOMY;  Surgeon: Raynelle Bring, MD;  [facility];  Service: Urology;  Laterality: Right;  . SIMPLE MASTECTOMY WITH AXILLARY SENTINEL NODE BIOPSY Right 07/28/2013   Procedure: RIGHT TOTAL  MASTECTOMY;  Surgeon: Imogene Burn. Georgette [place], MD;  Location: [place];  Service: General;  Laterality: Right;  . TONSILLECTOMY  AGE 12  . TOTAL ABDOMINAL HYSTERECTOMY W/ BILATERAL SALPINGOOPHORECTOMY  1997  . TRANSTHORACIC ECHOCARDIOGRAM  05-11-2007  dr Ron Parker   normal lvf/  ef 65%    There were no vitals filed for this visit.      Subjective Assessment - 08/26/16 1041    Subjective Pt stated she has sharp pain on her lower back, pain scale 5/10.  Reports compliance with HEP and riding bicycle.     Pertinent History Pt had back surgery on Sept 1st, 2017.  "They rebuilt my back."  After surgery, pt was d/c to [facility] for 3 weeks.  After she was discharged from [facility] center, she went home, and has been getting on a stationary bike doing and HEP at home since then.  Pt states she does not have to wear the brace anymore - per last MD visit.  She sometimes wears it because it helps to relieve the pain.    Pt has been doing seated marches, LAQ's, hip abduction with resistance band, ER with resistance band, bicycle.  history includes DDD, Rt breast cancer s/p mastectomy, sinus tachycardia   Patient Stated Goals Relieve pain.     Currently in Pain? Yes   Pain Score 5    Pain Location Back   Pain Orientation Lower   Pain Descriptors / Indicators Sharp;Tender   Pain Type Surgical pain   Pain Onset More than a month ago   Pain Frequency Intermittent    Aggravating Factors  activity   Pain Relieving Factors seating and laying down   Effect of Pain on Daily Activities decreased ADLs                         [facility] Adult PT Treatment/Exercise - 08/26/16 0001      Lumbar Exercises: Stretches   Active Hamstring Stretch 3 reps;30 seconds   Active Hamstring Stretch Limitations supine with rope   Lower Trunk Rotation 10 seconds   Lower Trunk Rotation Limitations 10x 10"   Piriformis Stretch 2 reps;30 seconds   Piriformis Stretch Limitations supine     Lumbar Exercises: Supine   Ab Set 10 reps;5 seconds  AB Set Limitations count outloud to reduce holding breath   Bridge 20 reps;5 seconds   Bridge Limitations 2 sets x 10 reps with band around knees to improve alignment     Lumbar Exercises: Sidelying   Clam 10 reps                  PT Short Term Goals - 08/06/16 1207      PT SHORT TERM GOAL #1   Title Pt will express an average of <4/10 pain to improve functional mobility, endurance, and quality of life.     Time 3   Period Weeks   Status New     PT SHORT TERM GOAL #2   Title Pt will demonstrate improved LE strength to at least 4/5 in all major muscle groups to improve pt's activity tolerance, standing tolerance, and gait duration.     Time 3   Period Weeks   Status New     PT SHORT TERM GOAL #3   Title Pt will be able to tolerate ambulating for at least 30 minutes at a time to improve re-integration into the community.     Time 3   Period Weeks   Status New     PT SHORT TERM GOAL #4   Title Pt will demonstrate gait speed of at least 2.29ft/sec to demonstrate appropriate speed for safe community ambulation with reduced risk of falls.     Time 3   Period Weeks   Status New           PT Long Term Goals - 08/06/16 1212      PT LONG TERM GOAL #1   Title Pt will express average pain at <2/10 to improve pt's functional mobility and quality of life.     Time 6   Period Weeks   Status New      PT LONG TERM GOAL #2   Title Pt will demonstrate correct functional lifting techniques to reduce strain on spine as well as pain with this activity.   Time 6   Period Weeks   Status New     PT LONG TERM GOAL #3   Title Pt will express being able to tolerate ambulation for 45 min to improve endurance and be able to perform grocery shopping and other community activites.     Time 6   Period Weeks   Status New     PT LONG TERM GOAL #4   Title Pt will demonstrate 4+/5 in all LE major muscle groups to improve pt's quality of gait, reduce pain and improve endurance for functional mobiltiy tasks.    Time 6   Period Weeks   Status New               Plan - 08/26/16 1105    Clinical Impression Statement Session focus on improving lumbar stability and LE mobility.  Added hamstring stretch for lengthening to assist with pain control and LTR to improve ER and core activation.  Pt able to complete all therex with good form following initial instruction, cueing required to continue breathing with ab sets.  EOS pt reports pain resolved.     Rehab Potential Good   PT Frequency 2x / week   PT Duration 6 weeks   PT Treatment/Interventions ADLs/Self Care Home Management;Cryotherapy;Electrical Stimulation;Moist Heat;Gait training;Stair training;Functional mobility training;Therapeutic activities;Therapeutic exercise;Balance training;Neuromuscular re-education;Patient/family education;Manual techniques;Scar mobilization;Passive range of motion;Dry needling;Energy conservation;Taping   PT Next Visit Plan Progress lumbar stability, LE stretching and postural  strengthening.     PT Home Exercise Plan Eval 12/12: Clams, Bridge, and Ab brace      Patient will benefit from skilled therapeutic intervention in order to improve the following deficits and impairments:  Abnormal gait, Decreased activity tolerance, Decreased balance, Decreased endurance, Decreased mobility, Decreased range of motion, Decreased  strength, Decreased scar mobility, Difficulty walking, Impaired flexibility, Postural dysfunction, Improper body mechanics, Hypomobility, Obesity, Pain  Visit Diagnosis: Pain in left hip  Other abnormalities of gait and mobility  Acute left-sided low back pain, with sciatica presence unspecified     Problem List Patient Active Problem List   Diagnosis Date Noted  . Status post lumbar spine operative procedure for decompression of spinal cord 05/26/2016  . Depression 05/05/2016  . Degenerative spondylolisthesis 04/25/2016  . Spasm of muscle, back 04/25/2016  . Genetic testing 11/30/2015  . Essential hypertension 10/18/2015  . Morbid obesity ([place]) 10/18/2015  . Upper airway cough syndrome 10/17/2015  . Renal mass 02/05/2015  . Anemia 10/25/2013  . GERD (gastroesophageal reflux disease) 09/27/2013  . CLL (chronic lymphocytic leukemia) ([employer]) 07/27/2013  . Breast cancer of lower-outer quadrant of right female breast ([place]) 07/26/2013  . Nephrolithiasis 06/16/2013  . 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 Chronic pain syndrome 05/10/2013  . DDD (degenerative disc disease) 05/10/2013  . SOB (shortness of breath)   . Sinus tachycardia   . HLD (hyperlipidemia)   . Orthostatic hypotension   . Anxiety   . Arthritis   . Diverticulosis   . Fibromyalgia   . Hypothyroidism    Ihor Austin, LPTA; [place]  Aldona Lento 08/26/2016, 11:21 AM  [place], [place], [address] Phone: [phone]   Fax:  [phone]  Name: [name] [name] MRN: [id number] Date of Birth: 12/10/1945

## 2016-08-27 ENCOUNTER — Other Ambulatory Visit: Payer: Self-pay | Admitting: Oncology

## 2016-08-27 DIAGNOSIS — C9110 Chronic lymphocytic leukemia of B-cell type not having achieved remission: Secondary | ICD-10-CM

## 2016-08-27 DIAGNOSIS — C50011 Malignant neoplasm of nipple and areola, right female breast: Secondary | ICD-10-CM

## 2016-08-28 ENCOUNTER — Ambulatory Visit (HOSPITAL_COMMUNITY): Payer: Medicare Other

## 2016-08-28 DIAGNOSIS — M25552 Pain in left hip: Secondary | ICD-10-CM

## 2016-08-28 DIAGNOSIS — R2689 Other abnormalities of gait and mobility: Secondary | ICD-10-CM

## 2016-08-28 DIAGNOSIS — M545 Low back pain: Secondary | ICD-10-CM

## 2016-08-28 NOTE — Therapy (Signed)
[place], [place], [address] Phone: [phone]   Fax:  [phone]  Physical Therapy Treatment  Patient Details  Name: [name] [name] MRN: [id number] Date of Birth: 1946/07/02 Referring Provider: Dr. Earnie Larsson  Encounter Date: 08/28/2016      PT End of Session - 08/28/16 1132    Visit Number 4   Number of Visits 12   Date for PT Re-Evaluation 09/02/16   Authorization Type [employer] Medicare   Authorization - Visit Number 4   Authorization - Number of Visits 12   PT Start Time [id number]   PT Stop Time 1200   PT Time Calculation (min) 41 min   Activity Tolerance Patient tolerated treatment well;No increased pain   Behavior During Therapy WFL for tasks assessed/performed      Past Medical History:  Diagnosis Date  . Alopecia   . Anxiety   . Arthritis    "spine" (07/28/2013)  . Breast cancer ([place]) 1997; 2014   right  . CAP (community acquired pneumonia)    admission 06-04-2013 secondary to failed oral antibitotics---  last cxr 06-16-2013  much improved  . Chronic back pain    "lower back and upper neck" (07/28/2013)  . CLL (chronic lymphocytic leukemia) ([place]) dx'd 05/2013   "I don't think I have it though" (07/28/2013)  . DDD (degenerative disc disease)   . Deafness in left ear   . 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 ear   . Depression   . Diverticulosis   . Ecchymosis    FROM IV AND LAB WORK THE PAST WEEK  06-17-2013  . Ejection fraction   . Elevated LFTs   . Fibromyalgia 1978  . GERD (gastroesophageal reflux disease)   . Hematuria   . History of syncope    episode 2008--  no recurrence since  . HLD (hyperlipidemia)   . Hypothyroidism   . Kidney stones 2014  . Leukocytosis   . Orthostatic hypotension    slight in the past  . Plantar fasciitis   . Ruptured disk    one in neck and two in back  . S/P chemotherapy, time since greater than 12 weeks   . Sensation of pressure in bladder area   . Sinus tachycardia     mild resting  . SOB (shortness of breath) 06-17-2013 CURRENTLY RESIDUAL FROM RECENT CAP   new since sore throats- 12/2014    . Stool incontinence    "at times recently"   . Ureteral calculi    bilateral  . Urgency of urination   . Wears glasses     Past Surgical History:  Procedure Laterality Date  . ABDOMINAL HYSTERECTOMY    . ANTERIOR LATERAL LUMBAR FUSION 4 LEVELS Left 04/25/2016   Procedure: LEFT LUMBAR ONE-TWO, LUMBAR TWO-THREE, LUMBAR THREE-FOUR, LUMBAR FOUR-FIVE ANTERIOR LATERAL LUMBAR FUSION;  Surgeon: Earnie Larsson, MD;  Location: [facility];  Service: Neurosurgery;  Laterality: Left;  . APPENDECTOMY  1997  . BREAST BIOPSY Right 2014  . BREAST LUMPECTOMY WITH AXILLARY LYMPH NODE DISSECTION Right 1997  . CYSTOSCOPY WITH RETROGRADE PYELOGRAM, URETEROSCOPY AND STENT PLACEMENT Bilateral 06/17/2013   Procedure: CYSTOSCOPY WITH RETROGRADE PYELOGRAM, URETEROSCOPY AND LEFT DOUBLE  J STENT PLACEMENT RIGHT URETERAL HOLMIIUM LASER AND DOUBLE J STENT ;  Surgeon: Hanley Ben, MD;  Location: [facility];  Service: Urology;  Laterality: Bilateral;  . CYSTOSCOPY WITH URETEROSCOPY, STONE BASKETRY AND STENT PLACEMENT Bilateral 2014  . HOLMIUM LASER APPLICATION Bilateral [id number]   Procedure:  HOLMIUM LASER APPLICATION;  Surgeon: Hanley Ben, MD;  Location: [facility];  Service: Urology;  Laterality: Bilateral;  . LITHOTRIPSY Left ~ 06/2013  . LUMBAR EPIDURAL INJECTION     has had 7 injections  . MASTECTOMY COMPLETE / SIMPLE Right 07/28/2013  . MASTECTOMY W/ NODES PARTIAL Right 1997  . PORT-A-CATH REMOVAL Left 10/03/2014   Procedure: REMOVAL PORT-A-CATH;  Surgeon: Donnie Mesa, MD;  Location: [facility];  Service: General;  Laterality: Left;  . PORTACATH PLACEMENT Left 07/28/2013   Procedure: ATTEMPTED INSERTION PORT-A-CATH;  Surgeon: Imogene Burn. Georgette Dover, MD;  Location: [facility];  Service: General;  Laterality: Left;  . POSTERIOR LUMBAR FUSION 4 LEVEL  N/A 04/25/2016   Procedure: LUMBAR FIVE-SACRAL ONE POSTERIOR LUMBAR INTERBODY FUSION, THORACIC NINE-SACRAL ONE POSTERIOR LATERAL ARTHRODESIS WITH PEDICLE SCREWS;  Surgeon: Earnie Larsson, MD;  Location: [facility];  Service: Neurosurgery;  Laterality: N/A;  . RETINAL DETACHMENT SURGERY Right 2013  . ROBOTIC ASSITED PARTIAL NEPHRECTOMY Right 02/05/2015   Procedure: ROBOTIC ASSITED PARTIAL NEPHRECTOMY;  Surgeon: Raynelle Bring, MD;  Location: [facility];  Service: Urology;  Laterality: Right;  . SIMPLE MASTECTOMY WITH AXILLARY SENTINEL NODE BIOPSY Right 07/28/2013   Procedure: RIGHT TOTAL  MASTECTOMY;  Surgeon: Imogene Burn. Georgette Dover, MD;  Location: [facility];  Service: General;  Laterality: Right;  . TONSILLECTOMY  AGE 44  . TOTAL ABDOMINAL HYSTERECTOMY W/ BILATERAL SALPINGOOPHORECTOMY  1997  . TRANSTHORACIC ECHOCARDIOGRAM  05-11-2007  dr Ron Parker   normal lvf/  ef 65%    There were no vitals filed for this visit.      Subjective Assessment - 08/28/16 1124    Subjective Pt stated she noted a little strain while having to manual pick up garage door this morning,  current pain free with increased pain with movement to pain scale 3/10.  reports compliance with HEP and riding bicycle daily.     Pertinent History Pt had back surgery on Sept 1st, 2017.  "They rebuilt my back."  After surgery, pt was d/c to [facility] for 3 weeks.  After she was discharged from [facility] center, she went home, and has been getting on a stationary bike doing and HEP at home since then.  Pt states she does not have to wear the brace anymore - per last MD visit.  She sometimes wears it because it helps to relieve the pain.    Pt has been doing seated marches, LAQ's, hip abduction with resistance band, ER with resistance band, bicycle.  history includes DDD, Rt breast cancer s/p mastectomy, sinus tachycardia   Patient Stated Goals Relieve pain.     Currently in Pain? Yes   Pain Score 3    Pain Location Back   Pain Orientation Lower   Pain  Descriptors / Indicators Aching   Pain Type Surgical pain   Pain Onset More than a month ago   Pain Frequency Intermittent   Aggravating Factors  activity   Pain Relieving Factors seating and laying down   Effect of Pain on Daily Activities decreased ADls                         [facility] Adult PT Treatment/Exercise - 08/28/16 0001      Lumbar Exercises: Stretches   Active Hamstring Stretch 3 reps;30 seconds   Active Hamstring Stretch Limitations supine with rope   Lower Trunk Rotation 10 seconds   Lower Trunk Rotation Limitations 10x 10"  Lumbar Exercises: Standing   Other Standing Lumbar Exercises mini squat with B UE supported.  2 x 10 reps     Lumbar Exercises: Seated   LAQ on Chair Limitations seated rows with RTB 15x   Sit to Stand 10 reps   Sit to Stand Limitations no HHA     Lumbar Exercises: Supine   Ab Set 10 reps;5 seconds   AB Set Limitations count outloud to reduce holding breath   Bent Knee Raise 10 reps;5 seconds   Bent Knee Raise Limitations dead bug 5" holds   Bridge 20 reps;5 seconds   Bridge Limitations 2 sets x 10 reps with band around knees to improve alignment                PT Education - 08/28/16 1210    Education provided Yes   Education Details educated on importance of posture   Person(s) Educated Patient   Methods Explanation;Demonstration   Comprehension Verbalized understanding;Returned demonstration;Need further instruction          PT Short Term Goals - 08/06/16 1207      PT SHORT TERM GOAL #1   Title Pt will express an average of <4/10 pain to improve functional mobility, endurance, and quality of life.     Time 3   Period Weeks   Status New     PT SHORT TERM GOAL #2   Title Pt will demonstrate improved LE strength to at least 4/5 in all major muscle groups to improve pt's activity tolerance, standing tolerance, and gait duration.     Time 3   Period Weeks   Status New     PT SHORT TERM GOAL #3    Title Pt will be able to tolerate ambulating for at least 30 minutes at a time to improve re-integration into the community.     Time 3   Period Weeks   Status New     PT SHORT TERM GOAL #4   Title Pt will demonstrate gait speed of at least 2.37ft/sec to demonstrate appropriate speed for safe community ambulation with reduced risk of falls.     Time 3   Period Weeks   Status New           PT Long Term Goals - 08/06/16 1212      PT LONG TERM GOAL #1   Title Pt will express average pain at <2/10 to improve pt's functional mobility and quality of life.     Time 6   Period Weeks   Status New     PT LONG TERM GOAL #2   Title Pt will demonstrate correct functional lifting techniques to reduce strain on spine as well as pain with this activity.   Time 6   Period Weeks   Status New     PT LONG TERM GOAL #3   Title Pt will express being able to tolerate ambulation for 45 min to improve endurance and be able to perform grocery shopping and other community activites.     Time 6   Period Weeks   Status New     PT LONG TERM GOAL #4   Title Pt will demonstrate 4+/5 in all LE major muscle groups to improve pt's quality of gait, reduce pain and improve endurance for functional mobiltiy tasks.    Time 6   Period Weeks   Status New               Plan - 08/28/16 1206  Clinical Impression Statement Progressed lumbar stability with additional core strengthening exercises as well as education on importance of posture awareness.  Min verbal and tactile cueing to improve core activation and instructed to count outloud with ab sets.  Began posture strengthening with cueing to reduce posterior lean.  no reports of pain through session, was limited by fatigue.     Rehab Potential Good   PT Frequency 2x / week   PT Duration 6 weeks   PT Treatment/Interventions ADLs/Self Care Home Management;Cryotherapy;Electrical Stimulation;Moist Heat;Gait training;Stair training;Functional mobility  training;Therapeutic activities;Therapeutic exercise;Balance training;Neuromuscular re-education;Patient/family education;Manual techniques;Scar mobilization;Passive range of motion;Dry needling;Energy conservation;Taping   PT Next Visit Plan Progress lumbar stability, LE stretching and postural strengthening.        Patient will benefit from skilled therapeutic intervention in order to improve the following deficits and impairments:  Abnormal gait, Decreased activity tolerance, Decreased balance, Decreased endurance, Decreased mobility, Decreased range of motion, Decreased strength, Decreased scar mobility, Difficulty walking, Impaired flexibility, Postural dysfunction, Improper body mechanics, Hypomobility, Obesity, Pain  Visit Diagnosis: Pain in left hip  Other abnormalities of gait and mobility  Acute left-sided low back pain, with sciatica presence unspecified     Problem List Patient Active Problem List   Diagnosis Date Noted  . Status post lumbar spine operative procedure for decompression of spinal cord 05/26/2016  . Depression 05/05/2016  . Degenerative spondylolisthesis 04/25/2016  . Spasm of muscle, back 04/25/2016  . Genetic testing 11/30/2015  . Essential hypertension 10/18/2015  . Morbid obesity ([place]) 10/18/2015  . Upper airway cough syndrome 10/17/2015  . Renal mass 02/05/2015  . Anemia 10/25/2013  . GERD (gastroesophageal reflux disease) 09/27/2013  . CLL (chronic lymphocytic leukemia) ([place]) 07/27/2013  . Breast cancer of lower-outer quadrant of right female breast ([place]) 07/26/2013  . Nephrolithiasis 06/16/2013  . 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 Chronic pain syndrome 05/10/2013  . DDD (degenerative disc disease) 05/10/2013  . SOB (shortness of breath)   . Sinus tachycardia   . HLD (hyperlipidemia)   . Orthostatic hypotension   . Anxiety   . Arthritis   . Diverticulosis   . Fibromyalgia   . Hypothyroidism    Ihor Austin, LPTA;  [place]  Aldona Lento 08/28/2016, 12:11 PM  [place], [place], [address] Phone: [phone]   Fax:  [phone]  Name: [name] [name] MRN: [id number] Date of Birth: 1946-06-07

## 2016-09-01 ENCOUNTER — Telehealth: Payer: Self-pay | Admitting: Oncology

## 2016-09-01 NOTE — Telephone Encounter (Signed)
PT CALLED TO R/S LAB APPT TO AFTER SHE RETURNS TO TOWN IN FEB. PT HAS NEW APPT DATE/TIME

## 2016-09-02 ENCOUNTER — Telehealth (HOSPITAL_COMMUNITY): Payer: Self-pay | Admitting: Internal Medicine

## 2016-09-02 ENCOUNTER — Ambulatory Visit (HOSPITAL_COMMUNITY): Payer: Medicare Other

## 2016-09-02 NOTE — Telephone Encounter (Signed)
09/02/16 pt cx - said she has had diarrhea all night

## 2016-09-03 ENCOUNTER — Other Ambulatory Visit: Payer: Medicare Other

## 2016-09-03 DIAGNOSIS — M545 Low back pain: Secondary | ICD-10-CM | POA: Diagnosis not present

## 2016-09-03 DIAGNOSIS — R2689 Other abnormalities of gait and mobility: Secondary | ICD-10-CM | POA: Diagnosis not present

## 2016-09-03 DIAGNOSIS — M25552 Pain in left hip: Secondary | ICD-10-CM | POA: Diagnosis not present

## 2016-09-04 ENCOUNTER — Telehealth (HOSPITAL_COMMUNITY): Payer: Self-pay

## 2016-09-04 ENCOUNTER — Ambulatory Visit (HOSPITAL_COMMUNITY): Payer: Medicare Other | Admitting: Physical Therapy

## 2016-09-04 ENCOUNTER — Encounter: Payer: Medicare Other | Admitting: Cardiology

## 2016-09-04 DIAGNOSIS — M545 Low back pain: Secondary | ICD-10-CM

## 2016-09-04 DIAGNOSIS — M25552 Pain in left hip: Secondary | ICD-10-CM

## 2016-09-04 DIAGNOSIS — R2689 Other abnormalities of gait and mobility: Secondary | ICD-10-CM

## 2016-09-04 NOTE — Telephone Encounter (Signed)
Patient is going to [place] be back until 09-23-16

## 2016-09-04 NOTE — Therapy (Addendum)
[place], [place], [address] Phone: [phone]   Fax:  [phone]  Physical Therapy Treatment/Re-evaluation/Discharge  Patient Details  Name: [name] [name] MRN: [id number] Date of Birth: Nov 24, 1945 Referring Provider: Earnie Larsson, MD  Encounter Date: 09/04/2016      PT End of Session - 09/04/16 1508    Visit Number 5   Number of Visits 15   Date for PT Re-Evaluation 10/01/16   Authorization Type BCBS Medicare   Authorization Time Period NEW: 09/17/16 to 10/23/16   Authorization - Visit Number 5   Authorization - Number of Visits 15   PT Start Time 0315   PT Stop Time 1200   PT Time Calculation (min) 43 min   Activity Tolerance Patient tolerated treatment well;No increased pain   Behavior During Therapy WFL for tasks assessed/performed      Past Medical History:  Diagnosis Date  . Alopecia   . Anxiety   . Arthritis    "spine" (07/28/2013)  . Breast cancer ([place]) 1997; 2014   right  . CAP (community acquired pneumonia)    admission 06-04-2013 secondary to failed oral antibitotics---  last cxr 06-16-2013  much improved  . Chronic back pain    "lower back and upper neck" (07/28/2013)  . CLL (chronic lymphocytic leukemia) ([place]) dx'd 05/2013   "I don't think I have it though" (07/28/2013)  . DDD (degenerative disc disease)   . Deafness in left ear   . 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 ear   . Depression   . Diverticulosis   . Ecchymosis    FROM IV AND LAB WORK THE PAST WEEK  06-17-2013  . Ejection fraction   . Elevated LFTs   . Fibromyalgia 1978  . GERD (gastroesophageal reflux disease)   . Hematuria   . History of syncope    episode 2008--  no recurrence since  . HLD (hyperlipidemia)   . Hypothyroidism   . Kidney stones 2014  . Leukocytosis   . Orthostatic hypotension    slight in the past  . Plantar fasciitis   . Ruptured disk    one in neck and two in back  . S/P chemotherapy, time since greater than 12  weeks   . Sensation of pressure in bladder area   . Sinus tachycardia    mild resting  . SOB (shortness of breath) 06-17-2013 CURRENTLY RESIDUAL FROM RECENT CAP   new since sore throats- 12/2014    . Stool incontinence    "at times recently"   . Ureteral calculi    bilateral  . Urgency of urination   . Wears glasses     Past Surgical History:  Procedure Laterality Date  . ABDOMINAL HYSTERECTOMY    . ANTERIOR LATERAL LUMBAR FUSION 4 LEVELS Left 04/25/2016   Procedure: LEFT LUMBAR ONE-TWO, LUMBAR TWO-THREE, LUMBAR THREE-FOUR, LUMBAR FOUR-FIVE ANTERIOR LATERAL LUMBAR FUSION;  Surgeon: Earnie Larsson, MD;  Location: [facility];  Service: Neurosurgery;  Laterality: Left;  . APPENDECTOMY  1997  . BREAST BIOPSY Right 2014  . BREAST LUMPECTOMY WITH AXILLARY LYMPH NODE DISSECTION Right 1997  . CYSTOSCOPY WITH RETROGRADE PYELOGRAM, URETEROSCOPY AND STENT PLACEMENT Bilateral 06/17/2013   Procedure: CYSTOSCOPY WITH RETROGRADE PYELOGRAM, URETEROSCOPY AND LEFT DOUBLE  J STENT PLACEMENT RIGHT URETERAL HOLMIIUM LASER AND DOUBLE J STENT ;  Surgeon: Hanley Ben, MD;  Location: [facility];  Service: Urology;  Laterality: Bilateral;  . CYSTOSCOPY WITH URETEROSCOPY, STONE BASKETRY AND STENT PLACEMENT Bilateral 2014  .  HOLMIUM LASER APPLICATION Bilateral 04/07/4817   Procedure: HOLMIUM LASER APPLICATION;  Surgeon: Hanley Ben, MD;  Location: [facility];  Service: Urology;  Laterality: Bilateral;  . LITHOTRIPSY Left ~ 06/2013  . LUMBAR EPIDURAL INJECTION     has had 7 injections  . MASTECTOMY COMPLETE / SIMPLE Right 07/28/2013  . MASTECTOMY W/ NODES PARTIAL Right 1997  . PORT-A-CATH REMOVAL Left 10/03/2014   Procedure: REMOVAL PORT-A-CATH;  Surgeon: Donnie Mesa, MD;  Location: [facility];  Service: General;  Laterality: Left;  . PORTACATH PLACEMENT Left 07/28/2013   Procedure: ATTEMPTED INSERTION PORT-A-CATH;  Surgeon: Imogene Burn. Georgette Dover, MD;  Location: [facility];  Service: General;  Laterality: Left;  . POSTERIOR LUMBAR FUSION 4 LEVEL N/A 04/25/2016   Procedure: LUMBAR FIVE-SACRAL ONE POSTERIOR LUMBAR INTERBODY FUSION, THORACIC NINE-SACRAL ONE POSTERIOR LATERAL ARTHRODESIS WITH PEDICLE SCREWS;  Surgeon: Earnie Larsson, MD;  Location: [facility];  Service: Neurosurgery;  Laterality: N/A;  . RETINAL DETACHMENT SURGERY Right 2013  . ROBOTIC ASSITED PARTIAL NEPHRECTOMY Right 02/05/2015   Procedure: ROBOTIC ASSITED PARTIAL NEPHRECTOMY;  Surgeon: Raynelle Bring, MD;  Location: [facility];  Service: Urology;  Laterality: Right;  . SIMPLE MASTECTOMY WITH AXILLARY SENTINEL NODE BIOPSY Right 07/28/2013   Procedure: RIGHT TOTAL  MASTECTOMY;  Surgeon: Imogene Burn. Georgette Dover, MD;  Location: [facility];  Service: General;  Laterality: Right;  . TONSILLECTOMY  AGE 48  . TOTAL ABDOMINAL HYSTERECTOMY W/ BILATERAL SALPINGOOPHORECTOMY  1997  . TRANSTHORACIC ECHOCARDIOGRAM  05-11-2007  dr Ron Parker   normal lvf/  ef 65%    There were no vitals filed for this visit.      Subjective Assessment - 09/04/16 1120    Subjective Pt reports that she is weak and in some pain today. She feels that she would've never had the surgery had she known that it would be bothering her this much. She is going to [place] on the 15th and is supposed to return on the 25th and she is nervous she won't be able to keep up.    Pertinent History Pt had back surgery on Sept 1st, 2017.  "They rebuilt my back."  After surgery, pt was d/c to [facility] for 3 weeks.  After she was discharged from [facility] center, she went home, and has been getting on a stationary bike doing and HEP at home since then.  Pt states she does not have to wear the brace anymore - per last MD visit.  She sometimes wears it because it helps to relieve the pain.    Pt has been doing seated marches, LAQ's, hip abduction with resistance band, ER with resistance band, bicycle.  history includes DDD, Rt breast cancer s/p mastectomy, sinus tachycardia    How long can you sit comfortably? unlimited    How long can you stand comfortably? no more than 30 minutes before she has to sit down.    How long can you walk comfortably? 10 minutes, 10+ minutes on her bike   Patient Stated Goals Relieve pain.     Currently in Pain? 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      Precautions   Precautions Back  Pt no longer has to wear her LSO.     Observation/Other Assessments   Focus on Therapeutic Outcomes (FOTO)  --   Other Surveys  Other Surveys   Oswestry Disability Index  58%   "severe limitation"   Fear Avoidance Belief Questionnaire (FABQ)  FABQ (W): 30; FABQ (PA): 18     Strength   Right Hip Flexion 4+/5   Right Hip Extension 4-/5   Right Hip ABduction 3/5   Left Hip Flexion 4/5   Left Hip Extension 4-/5   Left Hip ABduction 3+/5   Right Knee Flexion 4+/5  seated    Right Knee Extension 4/5   Left Knee Flexion 4+/5  seated   Left Knee Extension 4/5     Ambulation/Gait   Gait Comments x40 ft: (+) trendelenburg on Lt     6 minute walk test results    Endurance additional comments 2 min: 452f, reported Lt hip pain after 1 minute                             PT Education - 09/04/16 1502    Education provided Yes   Education Details reassessment findings/POC; encouraged pt to pace herself [place]; purpose of PT to decrease pain and improve quality of life/activity tolerance; anatomy of the spine and how surgery impacts this.    Person(s) Educated Patient   Methods Explanation   Comprehension Verbalized understanding          PT Short Term Goals - 09/04/16 1148       PT SHORT TERM GOAL #1   Title Pt will express an average of <4/10 pain to improve functional mobility, endurance, and quality of life.     Time 3   Period Weeks   Status New     PT SHORT TERM GOAL #2   Title Pt will demonstrate improved LE strength to at least 4/5 in all major muscle groups to improve pt's activity tolerance, standing tolerance, and gait duration.     Baseline hip extension/abduction limited below 4/5 MMT   Time 3   Period Weeks   Status Partially Met     PT SHORT TERM GOAL #3   Title Pt will be able to tolerate ambulating for at least 30 minutes at a time to improve re-integration into the community.     Baseline 10 minutes    Time 3   Period Weeks   Status Not Met     PT SHORT TERM GOAL #4   Title Pt will demonstrate gait speed of at least 2.639fsec to demonstrate appropriate speed for safe community ambulation with reduced risk of falls.     Baseline 3.38 ft/sec   Time 3   Period Weeks   Status Achieved           PT Long Term Goals - 09/04/16 1151      PT LONG TERM GOAL #1   Title Pt will express average pain at <2/10 to improve pt's functional mobility and quality of life.     Time 6   Period Weeks   Status Not Met     PT LONG TERM GOAL #2   Title Pt will demonstrate correct functional lifting techniques to reduce strain on spine as well as pain with this activity.   Time 6  Period Weeks   Status Unable to assess     PT LONG TERM GOAL #3   Title Pt will express being able to tolerate ambulation for 45 min to improve endurance and be able to perform grocery shopping and other community activites.     Time 6   Period Weeks   Status Not Met     PT LONG TERM GOAL #4   Title Pt will demonstrate 4+/5 in all LE major muscle groups to improve pt's quality of gait, reduce pain and improve endurance for functional mobiltiy tasks.    Time 4   Period Weeks   Status Not Met     PT LONG TERM GOAL #5   Title Pt will score less than 15 on the  FABQ-PA section to indicate she has developed more adaptive behaviors with physical activity.    Time 6   Period Weeks   Status New               Plan - Oct 02, 2016 1509    Clinical Impression Statement Pt was reassessed this visit and has met a couple of her goals. She demonstrates improvements in BLE strength, endurance and activity tolerance since beginning PT several weeks ago. She is somewhat discouraged that she still has pain following her surgery, so a large portion of today's session was spent educating her on the anatomy of the back and discussing the expected outcomes assuming she continues to work on her HEP and attend her PT sessions. She is leaving the country for almost 2 weeks and she was encouraged to perform her HEP and pace her activity to avoid any setbacks in progress. She continues to demonstrate limitations in BLE strength, endurance, Lt hip pain and high FABQ scores indicating she has higher levels of pain avoidant behaviors. She would continue to benefit from skilled PT to address her limitations in strength, activity tolerance and endurance to improve her quality of life and independence with daily tasks.    Rehab Potential Good   PT Frequency 2x / week   PT Duration 6 weeks   PT Treatment/Interventions ADLs/Self Care Home Management;Cryotherapy;Electrical Stimulation;Moist Heat;Gait training;Stair training;Functional mobility training;Therapeutic activities;Therapeutic exercise;Balance training;Neuromuscular re-education;Patient/family education;Manual techniques;Scar mobilization;Passive range of motion;Dry needling;Energy conservation;Taping   PT Next Visit Plan If with PT: assess hip pain.; hip abductor strengthening, hip flexor stretches, trunk stability exercise   PT Home Exercise Plan Eval 12/12: Clams, Bridge, and Ab brace   Consulted and Agree with Plan of Care Patient      Patient will benefit from skilled therapeutic intervention in order to improve the  following deficits and impairments:  Abnormal gait, Decreased activity tolerance, Decreased balance, Decreased endurance, Decreased mobility, Decreased range of motion, Decreased strength, Decreased scar mobility, Difficulty walking, Impaired flexibility, Postural dysfunction, Improper body mechanics, Hypomobility, Obesity, Pain  Visit Diagnosis: Pain in left hip - Plan: PT plan of care cert/re-cert  Other abnormalities of gait and mobility - Plan: PT plan of care cert/re-cert  Acute left-sided low back pain, with sciatica presence unspecified - Plan: PT plan of care cert/re-cert       G-Codes - 10-02-2016 1536    Functional Assessment Tool Used Clinical judgement based on assessment of strength, endurance and functional mobility.    Functional Limitation Mobility: Walking and moving around   Mobility: Walking and Moving Around Current Status [phone]) At least 60 percent but less than 80 percent impaired, limited or restricted   Mobility: Walking and Moving Around Goal Status (S0630) At  least 40 percent but less than 60 percent impaired, limited or restricted    *mobility: walking and moving around discharge status: 60-80% impaired, limited or restricted  Problem List Patient Active Problem List   Diagnosis Date Noted  . Status post lumbar spine operative procedure for decompression of spinal cord 05/26/2016  . Depression 05/05/2016  . Degenerative spondylolisthesis 04/25/2016  . Spasm of muscle, back 04/25/2016  . Genetic testing 11/30/2015  . Essential hypertension 10/18/2015  . Morbid obesity ([place]) 10/18/2015  . Upper airway cough syndrome 10/17/2015  . Renal mass 02/05/2015  . Anemia 10/25/2013  . GERD (gastroesophageal reflux disease) 09/27/2013  . CLL (chronic lymphocytic leukemia) ([place]) 07/27/2013  . Breast cancer of lower-outer quadrant of right female breast ([place]) 07/26/2013  . Nephrolithiasis 06/16/2013  . 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 Chronic pain syndrome  05/10/2013  . DDD (degenerative disc disease) 05/10/2013  . SOB (shortness of breath)   . Sinus tachycardia   . HLD (hyperlipidemia)   . Orthostatic hypotension   . Anxiety   . Arthritis   . Diverticulosis   . Fibromyalgia   . Hypothyroidism    3:57 PM,09/04/16 Elly Modena PT, DPT [facility] [place] [address] [place], [place], [address] Phone: [phone]   Fax:  [phone]  Name: [name] [name] MRN: [id number] Date of Birth: 11-15-1945     PHYSICAL THERAPY DISCHARGE SUMMARY  Visits from Start of Care: 5  Current functional level related to goals / functional outcomes: See above for more details    Remaining deficits: See above for more details    Education / Equipment: See above for more details  Plan: Patient agrees to discharge.  Patient goals were not met. Patient is being discharged due to meeting the stated rehab goals.  ?????     *Pt with self discharge, calling on 09/26/16 stating she "saw MD and was advised she could not PT now".    8:35 AM,02/26/17 [place] [phone]

## 2016-09-09 ENCOUNTER — Encounter (HOSPITAL_COMMUNITY): Payer: Medicare Other

## 2016-09-11 ENCOUNTER — Encounter (HOSPITAL_COMMUNITY): Payer: Medicare Other | Admitting: Physical Therapy

## 2016-09-16 ENCOUNTER — Encounter (HOSPITAL_COMMUNITY): Payer: Medicare Other

## 2016-09-18 ENCOUNTER — Encounter (HOSPITAL_COMMUNITY): Payer: Medicare Other | Admitting: Physical Therapy

## 2016-09-23 ENCOUNTER — Telehealth (HOSPITAL_COMMUNITY): Payer: Self-pay | Admitting: Internal Medicine

## 2016-09-23 ENCOUNTER — Ambulatory Visit (HOSPITAL_COMMUNITY): Payer: Medicare Other

## 2016-09-23 NOTE — Telephone Encounter (Signed)
09/23/16 pt called to cx - said she would be unable to come in today

## 2016-09-24 ENCOUNTER — Ambulatory Visit (INDEPENDENT_AMBULATORY_CARE_PROVIDER_SITE_OTHER): Payer: Medicare Other | Admitting: Cardiology

## 2016-09-24 ENCOUNTER — Encounter: Payer: Self-pay | Admitting: Cardiology

## 2016-09-24 VITALS — BP 126/76 | HR 99 | Ht 64.0 in | Wt 190.1 lb

## 2016-09-24 DIAGNOSIS — I951 Orthostatic hypotension: Secondary | ICD-10-CM

## 2016-09-24 DIAGNOSIS — Z853 Personal history of malignant neoplasm of breast: Secondary | ICD-10-CM

## 2016-09-24 DIAGNOSIS — M48061 Spinal stenosis, lumbar region without neurogenic claudication: Secondary | ICD-10-CM | POA: Diagnosis not present

## 2016-09-24 DIAGNOSIS — C9110 Chronic lymphocytic leukemia of B-cell type not having achieved remission: Secondary | ICD-10-CM

## 2016-09-24 NOTE — Progress Notes (Signed)
Cardiology Office Note    Date:  09/24/2016   ID:  [name] [name], DOB Feb 02, 1946, MRN [id number]  PCP:  Horatio Pel, MD  Cardiologist:   Candee Furbish, MD     History of Present Illness:  [name] [name] is a 71 y.o. female former patient of Dr. Ron Parker with history of breast cancer, chemotherapy, shortness of breath, orthostatic hypotension with several echoes in the past showing stable normal left ventricular function. No chest pain. No recent shortness of breath. No syncope. She's had mild sinus tachycardia at rest. Anxiety in the past.  She was in a skilled nursing facility after multilevel thoracic decompression and fusion.  Overall she is doing quite well from a cardiac standpoint. No complaints no chest pain, no shortness of breath, no syncope. She was taken off of the metoprolol. This is fine.  Has chronic lymphocytic leukemia-white count usually stable at around 31,000.  Has hypothyroidism as well. On Synthroid.  Past Medical History:  Diagnosis Date  . Alopecia   . Anxiety   . Arthritis    "spine" (07/28/2013)  . Breast cancer ([place]) 1997; 2014   right  . CAP (community acquired pneumonia)    admission 06-04-2013 secondary to failed oral antibitotics---  last cxr 06-16-2013  much improved  . Chronic back pain    "lower back and upper neck" (07/28/2013)  . CLL (chronic lymphocytic leukemia) ([place]) dx'd 05/2013   "I don't think I have it though" (07/28/2013)  . DDD (degenerative disc disease)   . Deafness in left ear   . 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 ear   . Depression   . Diverticulosis   . Ecchymosis    FROM IV AND LAB WORK THE PAST WEEK  06-17-2013  . Ejection fraction   . Elevated LFTs   . Fibromyalgia 1978  . GERD (gastroesophageal reflux disease)   . Hematuria   . History of syncope    episode 2008--  no recurrence since  . HLD (hyperlipidemia)   . Hypothyroidism   . Kidney stones 2014  . Leukocytosis   . Orthostatic hypotension    slight in the past  .  Plantar fasciitis   . Ruptured disk    one in neck and two in back  . S/P chemotherapy, time since greater than 12 weeks   . Sensation of pressure in bladder area   . Sinus tachycardia    mild resting  . SOB (shortness of breath) 06-17-2013 CURRENTLY RESIDUAL FROM RECENT CAP   new since sore throats- 12/2014    . Stool incontinence    "at times recently"   . Ureteral calculi    bilateral  . Urgency of urination   . Wears glasses     Past Surgical History:  Procedure Laterality Date  . ABDOMINAL HYSTERECTOMY    . ANTERIOR LATERAL LUMBAR FUSION 4 LEVELS Left 04/25/2016   Procedure: LEFT LUMBAR ONE-TWO, LUMBAR TWO-THREE, LUMBAR THREE-FOUR, LUMBAR FOUR-FIVE ANTERIOR LATERAL LUMBAR FUSION;  Surgeon: Earnie Larsson, MD;  Location: [facility];  Service: Neurosurgery;  Laterality: Left;  . APPENDECTOMY  1997  . BREAST BIOPSY Right 2014  . BREAST LUMPECTOMY WITH AXILLARY LYMPH NODE DISSECTION Right 1997  . CYSTOSCOPY WITH RETROGRADE PYELOGRAM, URETEROSCOPY AND STENT PLACEMENT Bilateral 06/17/2013   Procedure: CYSTOSCOPY WITH RETROGRADE PYELOGRAM, URETEROSCOPY AND LEFT DOUBLE  J STENT PLACEMENT RIGHT URETERAL HOLMIIUM LASER AND DOUBLE J STENT ;  Surgeon: Hanley Ben, MD;  Location: [facility];  Service: Urology;  Laterality: Bilateral;  .  CYSTOSCOPY WITH URETEROSCOPY, STONE BASKETRY AND STENT PLACEMENT Bilateral 2014  . HOLMIUM LASER APPLICATION Bilateral [id number]   Procedure: HOLMIUM LASER APPLICATION;  Surgeon: Hanley Ben, MD;  Location: [facility];  Service: Urology;  Laterality: Bilateral;  . LITHOTRIPSY Left ~ 06/2013  . LUMBAR EPIDURAL INJECTION     has had 7 injections  . MASTECTOMY COMPLETE / SIMPLE Right 07/28/2013  . MASTECTOMY W/ NODES PARTIAL Right 1997  . PORT-A-CATH REMOVAL Left 10/03/2014   Procedure: REMOVAL PORT-A-CATH;  Surgeon: Donnie Mesa, MD;  Location: [facility];  Service: General;  Laterality: Left;  . PORTACATH  PLACEMENT Left 07/28/2013   Procedure: ATTEMPTED INSERTION PORT-A-CATH;  Surgeon: Imogene Burn. Georgette Dover, MD;  Location: [facility];  Service: General;  Laterality: Left;  . POSTERIOR LUMBAR FUSION 4 LEVEL N/A 04/25/2016   Procedure: LUMBAR FIVE-SACRAL ONE POSTERIOR LUMBAR INTERBODY FUSION, THORACIC NINE-SACRAL ONE POSTERIOR LATERAL ARTHRODESIS WITH PEDICLE SCREWS;  Surgeon: Earnie Larsson, MD;  Location: [facility];  Service: Neurosurgery;  Laterality: N/A;  . RETINAL DETACHMENT SURGERY Right 2013  . ROBOTIC ASSITED PARTIAL NEPHRECTOMY Right 02/05/2015   Procedure: ROBOTIC ASSITED PARTIAL NEPHRECTOMY;  Surgeon: Raynelle Bring, MD;  Location: [facility];  Service: Urology;  Laterality: Right;  . SIMPLE MASTECTOMY WITH AXILLARY SENTINEL NODE BIOPSY Right 07/28/2013   Procedure: RIGHT TOTAL  MASTECTOMY;  Surgeon: Imogene Burn. Georgette Dover, MD;  Location: [facility];  Service: General;  Laterality: Right;  . TONSILLECTOMY  AGE 80  . TOTAL ABDOMINAL HYSTERECTOMY W/ BILATERAL SALPINGOOPHORECTOMY  1997  . TRANSTHORACIC ECHOCARDIOGRAM  05-11-2007  dr Ron Parker   normal lvf/  ef 65%    Current Medications: Outpatient Medications Prior to Visit  Medication Sig Dispense Refill  . ALPRAZolam (XANAX) 0.5 MG tablet Give 0.5 mg by mouth every 8 hours monitor for tearfulness, wringing  Of hands, shortness of breath    . amitriptyline (ELAVIL) 150 MG tablet Take 150 mg by mouth at bedtime.    Marland Kitchen anastrozole (ARIMIDEX) 1 MG tablet TAKE 1 TABLET(1 MG) BY MOUTH DAILY 90 tablet 2  . Apoaequorin (PREVAGEN) 10 MG CAPS Take 50 mg by mouth daily.    . Ascorbic Acid (VITAMIN C) 1000 MG tablet Take 1,000 mg by mouth daily.    Marland Kitchen aspirin 81 MG tablet Take 81 mg by mouth daily.    . Cholecalciferol (VITAMIN D-3 PO) Take 1 capsule by mouth daily. Reported on 09/25/2015    . Cyanocobalamin 2500 MCG CHEW Chew 2,500 mcg by mouth daily.    . ferrous sulfate (KP FERROUS SULFATE) 325 (65 FE) MG tablet Take 325 mg by mouth 2 (two) times daily with a meal.    .  gabapentin (NEURONTIN) 300 MG capsule TK 1 C PO QHS  4  . levothyroxine (SYNTHROID, LEVOTHROID) 75 MCG tablet Take 75 mcg by mouth daily before breakfast.    . minoxidil (ROGAINE) 2 % external solution Apply 1 application topically 2 (two) times daily as needed (hair.).     Marland Kitchen Multiple Vitamins-Minerals (HAIR/SKIN/NAILS PO) Take 1 tablet by mouth daily.    . Omega-3 Fatty Acids (FISH OIL) 1200 MG CAPS Take 1,200 mg by mouth daily.    Marland Kitchen omeprazole (PRILOSEC) 40 MG capsule TAKE 1 CAPSULE(40 MG) BY MOUTH EVERY MORNING 90 capsule 1  . oxyCODONE-acetaminophen (PERCOCET) 5-325 MG tablet Take 1 tablet by mouth every 8 (eight) hours as needed for severe pain.    Marland Kitchen REXULTI 2 MG TABS Take 2 mg by mouth at bedtime.  0  . VIIBRYD 40 MG TABS Take 40 mg by mouth daily.   1  . vitamin E 100 UNIT capsule Take 100 Units by mouth daily.     No facility-administered medications prior to visit.      Allergies:   Lasix [furosemide]; Lithium; Sulfa antibiotics; Trazodone and nefazodone; and Lyrica [pregabalin]   Social History   Social History  . Marital status: Married    Spouse name: Arnette Norris  . Number of children: 1  . Years of education: N/A   Occupational History  . homemaker    Social History Main Topics  . Smoking status: Never Smoker  . Smokeless tobacco: Never Used  . Alcohol use No     Comment: 07/28/2013 "no alcohol since 1994; never had problem w/it"  . Drug use: No  . Sexual activity: No     Comment: TAH/BSO   Other Topics Concern  . None   Social History Narrative  . None     Family History:  The patient's family history includes Bipolar disorder in her maternal aunt; Brain cancer in her paternal grandmother; Colon cancer (age of onset: 50) in her maternal aunt; Dementia in her mother; Diabetes in her maternal aunt, maternal grandfather, and mother; Heart disease in her maternal grandfather; Osteoporosis in her mother; Prostate cancer (age of onset: 61) in her father; Stomach cancer  in her paternal uncle; Stroke in her maternal aunt.   ROS:   Please see the history of present illness.    ROS All other systems reviewed and are negative.   PHYSICAL EXAM:   VS:  BP 126/76   Pulse 99   Ht 5\' 4"  (1.626 m)   Wt 190 lb 1.9 oz (86.2 kg)   LMP 08/26/1995   BMI 32.63 kg/m    GEN: Well nourished, well developed, in no acute distress  HEENT: normal  Neck: no JVD, carotid bruits, or masses Cardiac: RRR; no murmurs, rubs, or gallops,no edema  Respiratory:  clear to auscultation bilaterally, normal work of breathing GI: soft, nontender, nondistended, + BS MS: no deformity or atrophy  Skin: warm and dry, no rash Neuro:  Alert and Oriented x 3, Strength and sensation are intact Psych: euthymic mood, full affect  Wt Readings from Last 3 Encounters:  09/24/16 190 lb 1.9 oz (86.2 kg)  07/03/16 186 lb (84.4 kg)  04/25/16 192 lb 0.3 oz (87.1 kg)      Studies/Labs Reviewed:   EKG:  EKG is ordered today.  The ekg ordered today demonstrates 09/24/16-sinus rhythm, 99, no other abnormalities.  Recent Labs: 03/10/2016: ALT 55 05/05/2016: BUN 13; Creatinine, Ser 0.95; Potassium 4.2; Sodium 142; TSH 6.014 05/07/2016: Hemoglobin 8.1; Platelets 296   Lipid Panel No results found for: CHOL, TRIG, HDL, CHOLHDL, VLDL, LDLCALC, LDLDIRECT  Additional studies/ records that were reviewed today include: Prior lab work, EKG, office notes reviewed.  Echocardiogram 08/28/14:  - Left ventricle: The cavity size was normal. Systolic function was normal. The estimated ejection fraction was in the range of 55% to 60%. Wall motion was normal; there were no regional wall motion abnormalities.  Impressions:  - Global LV longitudinal strain -19.7%  ASSESSMENT:    1. CLL (chronic lymphocytic leukemia) ([place])   2. History of breast cancer   3. Orthostatic hypotension      PLAN:  In order of problems listed above:  Breast cancer  - Prior chemotherapy. LV function normal by  echocardiogram  Orthostatic hypotension  - Improved. Hydration.  Dyspnea  -  Improved. Previous cardiopulmonary exercise test demonstrated deconditioning.  History of sinus tachycardia  - Benign. She is off of the metoprolol. Her blood pressure is excellent. Heart rate 99 bpm. Stable.  CLL  - White count around 31,000. Stable.    Medication Adjustments/Labs and Tests Ordered: Current medicines are reviewed at length with the patient today.  Concerns regarding medicines are outlined above.  Medication changes, Labs and Tests ordered today are listed in the Patient Instructions below. Patient Instructions  Medication Instructions:  The current medical regimen is effective;  continue present plan and medications.  Follow-Up: Follow up as needed.  Thank you for choosing [facility]!!        Signed, Candee Furbish, MD  09/24/2016 4:25 PM    [facility] [place], [place], [place]  [address] Phone: [phone]; Fax: [phone]

## 2016-09-24 NOTE — Patient Instructions (Signed)
Medication Instructions:  The current medical regimen is effective;  continue present plan and medications.  Follow-Up: Follow up as needed.  Thank you for choosing [facility]!!     

## 2016-09-26 ENCOUNTER — Ambulatory Visit: Payer: Medicare Other | Admitting: Gastroenterology

## 2016-09-26 ENCOUNTER — Telehealth (HOSPITAL_COMMUNITY): Payer: Self-pay

## 2016-09-26 ENCOUNTER — Other Ambulatory Visit: Payer: Medicare Other

## 2016-09-26 NOTE — Telephone Encounter (Signed)
Pt saw MD and was advised she could not PT now. Patient requested to be D/c

## 2016-09-30 ENCOUNTER — Ambulatory Visit (HOSPITAL_COMMUNITY): Payer: Medicare Other

## 2016-10-01 ENCOUNTER — Other Ambulatory Visit: Payer: Medicare Other

## 2016-10-01 ENCOUNTER — Telehealth: Payer: Self-pay | Admitting: Oncology

## 2016-10-01 NOTE — Telephone Encounter (Signed)
Appointment rescheduled per patient request. °

## 2016-10-02 ENCOUNTER — Ambulatory Visit (HOSPITAL_COMMUNITY): Payer: Medicare Other

## 2016-10-07 ENCOUNTER — Other Ambulatory Visit: Payer: Self-pay

## 2016-10-07 ENCOUNTER — Ambulatory Visit (HOSPITAL_COMMUNITY): Payer: Medicare Other

## 2016-10-07 DIAGNOSIS — C9110 Chronic lymphocytic leukemia of B-cell type not having achieved remission: Secondary | ICD-10-CM

## 2016-10-08 ENCOUNTER — Telehealth: Payer: Self-pay | Admitting: Oncology

## 2016-10-08 ENCOUNTER — Other Ambulatory Visit: Payer: Medicare Other

## 2016-10-08 NOTE — Telephone Encounter (Signed)
Patient called to reschedule lab appointment.

## 2016-10-09 ENCOUNTER — Ambulatory Visit (HOSPITAL_COMMUNITY): Payer: Medicare Other | Admitting: Physical Therapy

## 2016-10-14 ENCOUNTER — Ambulatory Visit (HOSPITAL_COMMUNITY): Payer: Medicare Other | Admitting: Physical Therapy

## 2016-10-15 ENCOUNTER — Other Ambulatory Visit (HOSPITAL_BASED_OUTPATIENT_CLINIC_OR_DEPARTMENT_OTHER): Payer: Medicare Other

## 2016-10-15 DIAGNOSIS — C9110 Chronic lymphocytic leukemia of B-cell type not having achieved remission: Secondary | ICD-10-CM | POA: Diagnosis not present

## 2016-10-15 DIAGNOSIS — C50511 Malignant neoplasm of lower-outer quadrant of right female breast: Secondary | ICD-10-CM

## 2016-10-15 LAB — LACTATE DEHYDROGENASE: LDH: 250 U/L — ABNORMAL HIGH (ref 125–245)

## 2016-10-15 LAB — CBC WITH DIFFERENTIAL/PLATELET
BASO%: 0.5 % (ref 0.0–2.0)
Basophils Absolute: 0.2 10*3/uL — ABNORMAL HIGH (ref 0.0–0.1)
EOS ABS: 0.5 10*3/uL (ref 0.0–0.5)
EOS%: 1.4 % (ref 0.0–7.0)
HEMATOCRIT: 33.4 % — AB (ref 34.8–46.6)
HEMOGLOBIN: 10.4 g/dL — AB (ref 11.6–15.9)
LYMPH#: 26.8 10*3/uL — AB (ref 0.9–3.3)
LYMPH%: 79.1 % — ABNORMAL HIGH (ref 14.0–49.7)
MCH: 25.2 pg (ref 25.1–34.0)
MCHC: 31.2 g/dL — ABNORMAL LOW (ref 31.5–36.0)
MCV: 80.6 fL (ref 79.5–101.0)
MONO#: 1.1 10*3/uL — AB (ref 0.1–0.9)
MONO%: 3.1 % (ref 0.0–14.0)
NEUT%: 15.9 % — ABNORMAL LOW (ref 38.4–76.8)
NEUTROS ABS: 5.4 10*3/uL (ref 1.5–6.5)
PLATELETS: 191 10*3/uL (ref 145–400)
RBC: 4.14 10*6/uL (ref 3.70–5.45)
RDW: 17.7 % — AB (ref 11.2–14.5)
WBC: 33.9 10*3/uL — AB (ref 3.9–10.3)

## 2016-10-15 LAB — COMPREHENSIVE METABOLIC PANEL
ALBUMIN: 3.9 g/dL (ref 3.5–5.0)
ALK PHOS: 98 U/L (ref 40–150)
ALT: 46 U/L (ref 0–55)
AST: 40 U/L — ABNORMAL HIGH (ref 5–34)
Anion Gap: 9 mEq/L (ref 3–11)
BILIRUBIN TOTAL: 0.28 mg/dL (ref 0.20–1.20)
BUN: 20.9 mg/dL (ref 7.0–26.0)
CO2: 25 mEq/L (ref 22–29)
Calcium: 10.1 mg/dL (ref 8.4–10.4)
Chloride: 110 mEq/L — ABNORMAL HIGH (ref 98–109)
Creatinine: 1 mg/dL (ref 0.6–1.1)
EGFR: 55 mL/min/{1.73_m2} — AB (ref >90)
GLUCOSE: 87 mg/dL (ref 70–140)
Potassium: 4.4 mEq/L (ref 3.5–5.1)
SODIUM: 144 meq/L (ref 136–145)
TOTAL PROTEIN: 6.7 g/dL (ref 6.4–8.3)

## 2016-10-15 LAB — TECHNOLOGIST REVIEW

## 2016-10-16 ENCOUNTER — Ambulatory Visit (HOSPITAL_COMMUNITY): Payer: Medicare Other | Admitting: Physical Therapy

## 2016-10-16 LAB — BETA 2 MICROGLOBULIN, SERUM: BETA 2: 3.3 mg/L — AB (ref 0.6–2.4)

## 2016-10-21 ENCOUNTER — Ambulatory Visit (HOSPITAL_COMMUNITY): Payer: Medicare Other | Admitting: Physical Therapy

## 2016-10-23 DIAGNOSIS — H01003 Unspecified blepharitis right eye, unspecified eyelid: Secondary | ICD-10-CM | POA: Diagnosis not present

## 2016-10-23 DIAGNOSIS — H04123 Dry eye syndrome of bilateral lacrimal glands: Secondary | ICD-10-CM | POA: Diagnosis not present

## 2016-10-23 DIAGNOSIS — H04213 Epiphora due to excess lacrimation, bilateral lacrimal glands: Secondary | ICD-10-CM | POA: Diagnosis not present

## 2016-11-18 ENCOUNTER — Other Ambulatory Visit: Payer: Self-pay | Admitting: Obstetrics & Gynecology

## 2016-11-18 NOTE — Telephone Encounter (Signed)
Medication refill request: Levothyroxine 47mcg Last AEX:  10/09/15 SM Next AEX: 01/23/17 Last MMG (if hormonal medication request): 02/01/16 BIRADS 1 negative Refill authorized: 10/09/15 #90 w/4 refills; today please advise

## 2016-11-19 ENCOUNTER — Ambulatory Visit: Payer: Medicare Other | Admitting: Gastroenterology

## 2016-11-23 ENCOUNTER — Other Ambulatory Visit: Payer: Self-pay | Admitting: Oncology

## 2016-11-23 DIAGNOSIS — C50011 Malignant neoplasm of nipple and areola, right female breast: Secondary | ICD-10-CM

## 2016-11-23 DIAGNOSIS — C9110 Chronic lymphocytic leukemia of B-cell type not having achieved remission: Secondary | ICD-10-CM

## 2016-12-11 DIAGNOSIS — M48061 Spinal stenosis, lumbar region without neurogenic claudication: Secondary | ICD-10-CM | POA: Diagnosis not present

## 2017-01-20 ENCOUNTER — Ambulatory Visit: Payer: Medicare Other | Admitting: Gastroenterology

## 2017-01-20 ENCOUNTER — Telehealth: Payer: Self-pay | Admitting: Gastroenterology

## 2017-01-20 NOTE — Telephone Encounter (Signed)
Thanks do not bill

## 2017-01-23 ENCOUNTER — Ambulatory Visit: Payer: Medicare Other | Admitting: Obstetrics & Gynecology

## 2017-01-27 ENCOUNTER — Other Ambulatory Visit: Payer: Self-pay

## 2017-02-02 ENCOUNTER — Other Ambulatory Visit: Payer: Self-pay

## 2017-02-02 ENCOUNTER — Telehealth: Payer: Self-pay

## 2017-02-02 DIAGNOSIS — R7989 Other specified abnormal findings of blood chemistry: Secondary | ICD-10-CM

## 2017-02-02 DIAGNOSIS — R945 Abnormal results of liver function studies: Principal | ICD-10-CM

## 2017-02-02 NOTE — Telephone Encounter (Addendum)
Left message reminder that it is time for follow up labs as discussed after her abdominal u/s. Orders are entered.

## 2017-02-02 NOTE — Telephone Encounter (Signed)
-----   Message from Greggory Keen, LPN sent at 54/10/146  2:13 PM EST ----- Due her 6 month recheck of LFT's - she is expecting the call - orders are not in the chart yet

## 2017-02-03 ENCOUNTER — Other Ambulatory Visit: Payer: Self-pay

## 2017-02-03 DIAGNOSIS — R7989 Other specified abnormal findings of blood chemistry: Secondary | ICD-10-CM

## 2017-02-03 DIAGNOSIS — R945 Abnormal results of liver function studies: Principal | ICD-10-CM

## 2017-02-04 DIAGNOSIS — Z1231 Encounter for screening mammogram for malignant neoplasm of breast: Secondary | ICD-10-CM | POA: Diagnosis not present

## 2017-02-04 DIAGNOSIS — Z853 Personal history of malignant neoplasm of breast: Secondary | ICD-10-CM | POA: Diagnosis not present

## 2017-02-04 LAB — HM MAMMOGRAPHY

## 2017-02-11 DIAGNOSIS — I10 Essential (primary) hypertension: Secondary | ICD-10-CM | POA: Diagnosis not present

## 2017-02-11 DIAGNOSIS — M48061 Spinal stenosis, lumbar region without neurogenic claudication: Secondary | ICD-10-CM | POA: Diagnosis not present

## 2017-02-11 DIAGNOSIS — Z6834 Body mass index (BMI) 34.0-34.9, adult: Secondary | ICD-10-CM | POA: Diagnosis not present

## 2017-02-13 ENCOUNTER — Other Ambulatory Visit: Payer: Self-pay | Admitting: Neurosurgery

## 2017-02-13 DIAGNOSIS — M48061 Spinal stenosis, lumbar region without neurogenic claudication: Secondary | ICD-10-CM

## 2017-02-14 ENCOUNTER — Other Ambulatory Visit: Payer: Self-pay | Admitting: Obstetrics & Gynecology

## 2017-02-16 NOTE — Telephone Encounter (Signed)
Medication refill request: Levothyroxine 56mcg Last AEX:  10/09/15 SM Next AEX: 02/23/17 Last MMG (if hormonal medication request): 02/01/16 BIRADS 1 negative Refill authorized: 11/18/16 #90 w/0 refills; today please advise

## 2017-02-19 ENCOUNTER — Ambulatory Visit
Admission: RE | Admit: 2017-02-19 | Discharge: 2017-02-19 | Disposition: A | Discharge status: Home / Self Care | Payer: Medicare Other | Source: Ambulatory Visit | Attending: Neurosurgery | Admitting: Neurosurgery

## 2017-02-19 DIAGNOSIS — M48061 Spinal stenosis, lumbar region without neurogenic claudication: Secondary | ICD-10-CM

## 2017-02-23 ENCOUNTER — Ambulatory Visit: Payer: Medicare Other | Admitting: Obstetrics & Gynecology

## 2017-02-23 ENCOUNTER — Telehealth: Payer: Self-pay | Admitting: *Deleted

## 2017-02-23 NOTE — Telephone Encounter (Signed)
Called patient. Left voicemail to call back to reschedule AEX for today due to Dr. running late from surgery.   Please schedule appt when she calls back.

## 2017-02-23 NOTE — Progress Notes (Deleted)
71 y.o. G1P1 MarriedCaucasianF here for annual exam.    Patient's last menstrual period was 08/26/1995.          Sexually active: {yes no:314532}  The current method of family planning is status post hysterectomy.    Exercising: {yes no:314532}  {types:19826} Smoker:  {YES [id number]  Health Maintenance: Pap:  10/09/15 Neg  History of abnormal Pap:  no MMG:  02/01/16 BIRADS1:neg  Colonoscopy:  11/03/14 Normal. F/u 10 years  BMD:   02/01/16 Normal  TDaP:  10/2015  Pneumonia vaccine(s):  *** Zostavax:   *** Hep C testing: *** Screening Labs: ***, Hb today: ***, Urine today: ***   reports that she has never smoked. She has never used smokeless tobacco. She reports that she does not drink alcohol or use drugs.  Past Medical History:  Diagnosis Date  . Alopecia   . Anxiety   . Arthritis    "spine" (07/28/2013)  . Breast cancer ([place]) 1997; 2014   right  . CAP (community acquired pneumonia)    admission 06-04-2013 secondary to failed oral antibitotics---  last cxr 06-16-2013  much improved  . Chronic back pain    "lower back and upper neck" (07/28/2013)  . CLL (chronic lymphocytic leukemia) ([place]) dx'd 05/2013   "I don't think I have it though" (07/28/2013)  . DDD (degenerative disc disease)   . Deafness in left ear   . 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 ear   . Depression   . Diverticulosis   . Ecchymosis    FROM IV AND LAB WORK THE PAST WEEK  06-17-2013  . Ejection fraction   . Elevated LFTs   . Fibromyalgia 1978  . GERD (gastroesophageal reflux disease)   . Hematuria   . History of syncope    episode 2008--  no recurrence since  . HLD (hyperlipidemia)   . Hypothyroidism   . Kidney stones 2014  . Leukocytosis   . Orthostatic hypotension    slight in the past  . Plantar fasciitis   . Ruptured disk    one in neck and two in back  . S/P chemotherapy, time since greater than 12 weeks   . Sensation of pressure in bladder area   . Sinus tachycardia    mild resting  . SOB (shortness of breath)  06-17-2013 CURRENTLY RESIDUAL FROM RECENT CAP   new since sore throats- 12/2014    . Stool incontinence    "at times recently"   . Ureteral calculi    bilateral  . Urgency of urination   . Wears glasses     Past Surgical History:  Procedure Laterality Date  . ABDOMINAL HYSTERECTOMY    . ANTERIOR LATERAL LUMBAR FUSION 4 LEVELS Left 04/25/2016   Procedure: LEFT LUMBAR ONE-TWO, LUMBAR TWO-THREE, LUMBAR THREE-FOUR, LUMBAR FOUR-FIVE ANTERIOR LATERAL LUMBAR FUSION;  Surgeon: Earnie Larsson, MD;  Location: [facility];  Service: Neurosurgery;  Laterality: Left;  . APPENDECTOMY  1997  . BREAST BIOPSY Right 2014  . BREAST LUMPECTOMY WITH AXILLARY LYMPH NODE DISSECTION Right 1997  . CYSTOSCOPY WITH RETROGRADE PYELOGRAM, URETEROSCOPY AND STENT PLACEMENT Bilateral 06/17/2013   Procedure: CYSTOSCOPY WITH RETROGRADE PYELOGRAM, URETEROSCOPY AND LEFT DOUBLE  J STENT PLACEMENT RIGHT URETERAL HOLMIIUM LASER AND DOUBLE J STENT ;  Surgeon: Hanley Ben, MD;  Location: [facility];  Service: Urology;  Laterality: Bilateral;  . CYSTOSCOPY WITH URETEROSCOPY, STONE BASKETRY AND STENT PLACEMENT Bilateral 2014  . HOLMIUM LASER APPLICATION Bilateral 58/04/9832   Procedure: HOLMIUM LASER APPLICATION;  Surgeon: Hanley Ben,  MD;  Location: [facility];  Service: Urology;  Laterality: Bilateral;  . LITHOTRIPSY Left ~ 06/2013  . LUMBAR EPIDURAL INJECTION     has had 7 injections  . MASTECTOMY COMPLETE / SIMPLE Right 07/28/2013  . MASTECTOMY W/ NODES PARTIAL Right 1997  . PORT-A-CATH REMOVAL Left 10/03/2014   Procedure: REMOVAL PORT-A-CATH;  Surgeon: Donnie Mesa, MD;  Location: [facility];  Service: General;  Laterality: Left;  . PORTACATH PLACEMENT Left 07/28/2013   Procedure: ATTEMPTED INSERTION PORT-A-CATH;  Surgeon: Imogene Burn. Georgette Dover, MD;  Location: [facility];  Service: General;  Laterality: Left;  . POSTERIOR LUMBAR FUSION 4 LEVEL N/A 04/25/2016   Procedure: LUMBAR  FIVE-SACRAL ONE POSTERIOR LUMBAR INTERBODY FUSION, THORACIC NINE-SACRAL ONE POSTERIOR LATERAL ARTHRODESIS WITH PEDICLE SCREWS;  Surgeon: Earnie Larsson, MD;  Location: [facility];  Service: Neurosurgery;  Laterality: N/A;  . RETINAL DETACHMENT SURGERY Right 2013  . ROBOTIC ASSITED PARTIAL NEPHRECTOMY Right 02/05/2015   Procedure: ROBOTIC ASSITED PARTIAL NEPHRECTOMY;  Surgeon: Raynelle Bring, MD;  [facility];  Service: Urology;  Laterality: Right;  . SIMPLE MASTECTOMY WITH AXILLARY SENTINEL NODE BIOPSY Right 07/28/2013   Procedure: RIGHT TOTAL  MASTECTOMY;  Surgeon: Imogene Burn. Georgette Dover, MD;  Location: [facility];  Service: General;  Laterality: Right;  . TONSILLECTOMY  AGE 66  . TOTAL ABDOMINAL HYSTERECTOMY W/ BILATERAL SALPINGOOPHORECTOMY  1997  . TRANSTHORACIC ECHOCARDIOGRAM  05-11-2007  dr Ron Parker   normal lvf/  ef 65%    Current Outpatient Prescriptions  Medication Sig Dispense Refill  . ALPRAZolam (XANAX) 0.5 MG tablet Give 0.5 mg by mouth every 8 hours monitor for tearfulness, wringing  Of hands, shortness of breath    . amitriptyline (ELAVIL) 150 MG tablet Take 150 mg by mouth at bedtime.    Marland Kitchen anastrozole (ARIMIDEX) 1 MG tablet TAKE 1 TABLET(1 MG) BY MOUTH DAILY 90 tablet 2  . Apoaequorin (PREVAGEN) 10 MG CAPS Take 50 mg by mouth daily.    . Ascorbic Acid (VITAMIN C) 1000 MG tablet Take 1,000 mg by mouth daily.    Marland Kitchen aspirin 81 MG tablet Take 81 mg by mouth daily.    . Cholecalciferol (VITAMIN D-3 PO) Take 1 capsule by mouth daily. Reported on 09/25/2015    . Cyanocobalamin 2500 MCG CHEW Chew 2,500 mcg by mouth daily.    . ferrous sulfate (KP FERROUS SULFATE) 325 (65 FE) MG tablet Take 325 mg by mouth 2 (two) times daily with a meal.    . gabapentin (NEURONTIN) 300 MG capsule TK 1 C PO QHS  4  . levothyroxine (SYNTHROID, LEVOTHROID) 50 MCG tablet TAKE 1 TABLET(50 MCG) BY MOUTH DAILY BEFORE BREAKFAST 90 tablet 0  . minoxidil (ROGAINE) 2 % external solution Apply 1 application topically 2 (two)  times daily as needed (hair.).     Marland Kitchen Multiple Vitamins-Minerals (HAIR/SKIN/NAILS PO) Take 1 tablet by mouth daily.    . Omega-3 Fatty Acids (FISH OIL) 1200 MG CAPS Take 1,200 mg by mouth daily.    Marland Kitchen omeprazole (PRILOSEC) 40 MG capsule TAKE 1 CAPSULE(40 MG) BY MOUTH EVERY MORNING 90 capsule 0  . oxyCODONE-acetaminophen (PERCOCET) 5-325 MG tablet Take 1 tablet by mouth every 8 (eight) hours as needed for severe pain.    Marland Kitchen REXULTI 2 MG TABS Take 2 mg by mouth at bedtime.   0  . VIIBRYD 40 MG TABS Take 40 mg by mouth daily.   1  . vitamin E 100 UNIT capsule Take 100 Units by mouth daily.  No current facility-administered medications for this visit.     Family History  Problem Relation Age of Onset  . Heart disease Maternal Grandfather   . Diabetes Maternal Grandfather   . Colon cancer Maternal Aunt 79  . Brain cancer Paternal Grandmother        dx in 74s  . Dementia Mother   . Diabetes Mother   . Osteoporosis Mother   . Diabetes Maternal Aunt   . Prostate cancer Father 63  . Bipolar disorder Maternal Aunt   . Stroke Maternal Aunt   . Stomach cancer Paternal Uncle        dx in late 38s    ROS:  Pertinent items are noted in HPI.  Otherwise, a comprehensive ROS was negative.  Exam:   LMP 08/26/1995   Weight change: @WEIGHTCHANGE @ Height:      Ht Readings from Last 3 Encounters:  09/24/16 5\' 4"  (1.626 m)  07/03/16 5\' 4"  (1.626 m)  04/25/16 5\' 3"  (1.6 m)    General appearance: alert, cooperative and appears stated age Head: Normocephalic, without obvious abnormality, atraumatic Neck: no adenopathy, supple, symmetrical, trachea midline and thyroid {EXAM; THYROID:18604} Lungs: clear to auscultation bilaterally Breasts: {Exam; breast:13139::"normal appearance, no masses or tenderness"} Heart: regular rate and rhythm Abdomen: soft, non-tender; bowel sounds normal; no masses,  no organomegaly Extremities: extremities normal, atraumatic, no cyanosis or edema Skin: Skin color,  texture, turgor normal. No rashes or lesions Lymph nodes: Cervical, supraclavicular, and axillary nodes normal. No abnormal inguinal nodes palpated Neurologic: Grossly normal   Pelvic: External genitalia:  no lesions              Urethra:  normal appearing urethra with no masses, tenderness or lesions              Bartholins and Skenes: normal                 Vagina: normal appearing vagina with normal color and discharge, no lesions              Cervix: {exam; cervix:14595}              Pap taken: {yes no:314532} Bimanual Exam:  Uterus:  {exam; uterus:12215}              Adnexa: {exam; adnexa:12223}               Rectovaginal: Confirms               Anus:  normal sphincter tone, no lesions  Chaperone was present for exam.  A:  Well Woman with normal exam  P:   {plan; gyn:5269::"mammogram","pap smear","return annually or prn"}

## 2017-02-24 NOTE — Telephone Encounter (Signed)
AEX scheduled 04/17/17

## 2017-03-03 ENCOUNTER — Other Ambulatory Visit: Payer: Medicare Other

## 2017-03-05 ENCOUNTER — Other Ambulatory Visit: Payer: Self-pay | Admitting: Oncology

## 2017-03-05 DIAGNOSIS — C9110 Chronic lymphocytic leukemia of B-cell type not having achieved remission: Secondary | ICD-10-CM

## 2017-03-05 DIAGNOSIS — C50011 Malignant neoplasm of nipple and areola, right female breast: Secondary | ICD-10-CM

## 2017-03-10 ENCOUNTER — Ambulatory Visit: Payer: Medicare Other | Admitting: Oncology

## 2017-03-11 ENCOUNTER — Other Ambulatory Visit: Payer: Self-pay | Admitting: Neurosurgery

## 2017-03-11 DIAGNOSIS — I10 Essential (primary) hypertension: Secondary | ICD-10-CM | POA: Diagnosis not present

## 2017-03-11 DIAGNOSIS — S32009K Unspecified fracture of unspecified lumbar vertebra, subsequent encounter for fracture with nonunion: Secondary | ICD-10-CM | POA: Diagnosis not present

## 2017-03-11 DIAGNOSIS — Z6834 Body mass index (BMI) 34.0-34.9, adult: Secondary | ICD-10-CM | POA: Diagnosis not present

## 2017-03-17 ENCOUNTER — Other Ambulatory Visit (HOSPITAL_COMMUNITY): Payer: Self-pay | Admitting: Neurosurgery

## 2017-03-17 DIAGNOSIS — S32009K Unspecified fracture of unspecified lumbar vertebra, subsequent encounter for fracture with nonunion: Secondary | ICD-10-CM

## 2017-03-20 ENCOUNTER — Other Ambulatory Visit (INDEPENDENT_AMBULATORY_CARE_PROVIDER_SITE_OTHER): Payer: Medicare Other

## 2017-03-20 DIAGNOSIS — R7989 Other specified abnormal findings of blood chemistry: Secondary | ICD-10-CM

## 2017-03-20 DIAGNOSIS — R945 Abnormal results of liver function studies: Principal | ICD-10-CM

## 2017-03-20 LAB — HEPATIC FUNCTION PANEL
ALT: 39 U/L — AB (ref 0–35)
AST: 31 U/L (ref 0–37)
Albumin: 4.5 g/dL (ref 3.5–5.2)
Alkaline Phosphatase: 70 U/L (ref 39–117)
BILIRUBIN DIRECT: 0.1 mg/dL (ref 0.0–0.3)
BILIRUBIN TOTAL: 0.3 mg/dL (ref 0.2–1.2)
TOTAL PROTEIN: 7.1 g/dL (ref 6.0–8.3)

## 2017-03-23 ENCOUNTER — Ambulatory Visit: Payer: Medicare Other | Admitting: Gastroenterology

## 2017-03-24 ENCOUNTER — Other Ambulatory Visit: Payer: Self-pay

## 2017-03-24 ENCOUNTER — Ambulatory Visit (HOSPITAL_COMMUNITY): Payer: Medicare Other

## 2017-03-24 ENCOUNTER — Ambulatory Visit (HOSPITAL_COMMUNITY)
Admission: RE | Admit: 2017-03-24 | Discharge: 2017-03-24 | Disposition: A | Discharge status: Home / Self Care | Payer: Medicare Other | Source: Ambulatory Visit | Attending: Neurosurgery | Admitting: Neurosurgery

## 2017-03-24 DIAGNOSIS — M48061 Spinal stenosis, lumbar region without neurogenic claudication: Secondary | ICD-10-CM | POA: Diagnosis not present

## 2017-03-24 DIAGNOSIS — S32009K Unspecified fracture of unspecified lumbar vertebra, subsequent encounter for fracture with nonunion: Secondary | ICD-10-CM | POA: Diagnosis not present

## 2017-03-24 DIAGNOSIS — R945 Abnormal results of liver function studies: Principal | ICD-10-CM

## 2017-03-24 DIAGNOSIS — M4326 Fusion of spine, lumbar region: Secondary | ICD-10-CM | POA: Diagnosis not present

## 2017-03-24 DIAGNOSIS — X58XXXD Exposure to other specified factors, subsequent encounter: Secondary | ICD-10-CM | POA: Insufficient documentation

## 2017-03-24 DIAGNOSIS — R7989 Other specified abnormal findings of blood chemistry: Secondary | ICD-10-CM

## 2017-03-25 ENCOUNTER — Other Ambulatory Visit (HOSPITAL_BASED_OUTPATIENT_CLINIC_OR_DEPARTMENT_OTHER): Payer: Medicare Other

## 2017-03-25 ENCOUNTER — Ambulatory Visit (HOSPITAL_COMMUNITY): Payer: Medicare Other

## 2017-03-25 DIAGNOSIS — C9110 Chronic lymphocytic leukemia of B-cell type not having achieved remission: Secondary | ICD-10-CM

## 2017-03-25 DIAGNOSIS — C9190 Lymphoid leukemia, unspecified not having achieved remission: Secondary | ICD-10-CM | POA: Diagnosis not present

## 2017-03-25 DIAGNOSIS — C50511 Malignant neoplasm of lower-outer quadrant of right female breast: Secondary | ICD-10-CM | POA: Diagnosis not present

## 2017-03-25 LAB — COMPREHENSIVE METABOLIC PANEL
ALK PHOS: 102 U/L (ref 40–150)
ALT: 52 U/L (ref 0–55)
ANION GAP: 10 meq/L (ref 3–11)
AST: 41 U/L — AB (ref 5–34)
Albumin: 4.2 g/dL (ref 3.5–5.0)
BILIRUBIN TOTAL: 0.36 mg/dL (ref 0.20–1.20)
BUN: 17.9 mg/dL (ref 7.0–26.0)
CALCIUM: 9.4 mg/dL (ref 8.4–10.4)
CO2: 24 mEq/L (ref 22–29)
Chloride: 106 mEq/L (ref 98–109)
Creatinine: 1.1 mg/dL (ref 0.6–1.1)
EGFR: 52 mL/min/{1.73_m2} — AB (ref >90)
Glucose: 114 mg/dl (ref 70–140)
POTASSIUM: 4.2 meq/L (ref 3.5–5.1)
SODIUM: 140 meq/L (ref 136–145)
Total Protein: 6.8 g/dL (ref 6.4–8.3)

## 2017-03-25 LAB — CBC WITH DIFFERENTIAL/PLATELET
BASO%: 0.3 % (ref 0.0–2.0)
Basophils Absolute: 0.1 10*3/uL (ref 0.0–0.1)
EOS%: 1.5 % (ref 0.0–7.0)
Eosinophils Absolute: 0.5 10*3/uL (ref 0.0–0.5)
HEMATOCRIT: 35.8 % (ref 34.8–46.6)
HEMOGLOBIN: 11.1 g/dL — AB (ref 11.6–15.9)
LYMPH#: 24.9 10*3/uL — AB (ref 0.9–3.3)
LYMPH%: 80.1 % — ABNORMAL HIGH (ref 14.0–49.7)
MCH: 26.6 pg (ref 25.1–34.0)
MCHC: 31 g/dL — AB (ref 31.5–36.0)
MCV: 85.6 fL (ref 79.5–101.0)
MONO#: 1.3 10*3/uL — ABNORMAL HIGH (ref 0.1–0.9)
MONO%: 4.2 % (ref 0.0–14.0)
NEUT#: 4.3 10*3/uL (ref 1.5–6.5)
NEUT%: 13.9 % — ABNORMAL LOW (ref 38.4–76.8)
Platelets: 161 10*3/uL (ref 145–400)
RBC: 4.18 10*6/uL (ref 3.70–5.45)
RDW: 15.7 % — AB (ref 11.2–14.5)
WBC: 31.1 10*3/uL — ABNORMAL HIGH (ref 3.9–10.3)

## 2017-03-25 LAB — LACTATE DEHYDROGENASE: LDH: 280 U/L — AB (ref 125–245)

## 2017-03-25 LAB — TECHNOLOGIST REVIEW

## 2017-03-26 LAB — BETA 2 MICROGLOBULIN, SERUM: Beta-2: 3.1 mg/L — ABNORMAL HIGH (ref 0.6–2.4)

## 2017-03-31 NOTE — Pre-Procedure Instructions (Signed)
Christionna Poland Abila  03/31/2017      [facility] - [place], [place] - [place]. [address] [place] [address] Phone: [phone] Fax: [phone]  [id number] - [place], [place] [address] Phone: [phone] Fax: [phone]  [facility] [place], [place] - [address] [place] [address] Phone: [phone] Fax: [phone]    Your procedure is scheduled on August 13  Report to [facility] at 0600 A.M.  Call this number if you have problems the morning of surgery:  [phone]   Remember:  Do not eat food or drink liquids after midnight.  Continue all other medications as directed by your physician except follow these instructions about you medications   Take these medicines the morning of surgery with A SIP OF WATER ALPRAZolam (XANAX),  anastrozole (ARIMIDEX), DULoxetine (CYMBALTA),  gabapentin (NEURONTIN), levothyroxine (SYNTHROID, LEVOTHROID), oxyCODONE-acetaminophen (PERCOCET), REXULTI   7 days prior to surgery STOP taking any Aspirin, Aleve, Naproxen, Ibuprofen, Motrin, Advil, Goody's, BC's, all herbal medications, fish oil, and all vitamins    Do not wear jewelry, make-up or nail polish.  Do not wear lotions, powders, or perfumes, or deoderant.  Do not shave 48 hours prior to surgery.  Men may shave face and neck.  Do not bring valuables to the hospital.  [facility] is not responsible for any belongings or valuables.  Contacts, dentures or bridgework may not be worn into surgery.  Leave your suitcase in the car.  After surgery it may be brought to your room.  For patients admitted to the hospital, discharge time will be determined by your treatment team.  Patients discharged the day of surgery will not be allowed to drive home.     Special instructions:   [facility]- Preparing For Surgery  Before surgery, you can play an important role. Because skin is not sterile, your skin needs to be as free of germs as possible. You can reduce the number of germs on your skin by washing with CHG (chlorahexidine gluconate) Soap before surgery.  CHG is an antiseptic cleaner which kills germs and bonds with the skin to continue killing germs even after washing.  Please do not use if you have an allergy to CHG or antibacterial soaps. If your skin becomes reddened/irritated stop using the CHG.  Do not shave (including legs and underarms) for at least 48 hours prior to first CHG shower. It is OK to shave your face.  Please follow these instructions carefully.   1. Shower the NIGHT BEFORE SURGERY and the MORNING OF SURGERY with CHG.   2. If you chose to wash your hair, wash your hair first as usual with your normal shampoo.  3. After you shampoo, rinse your hair and body thoroughly to remove the shampoo.  4. Use CHG as you would any other liquid soap. You can apply CHG directly to the skin and wash gently with a scrungie or a clean washcloth.   5. Apply the CHG Soap to your body ONLY FROM THE NECK DOWN.  Do not use on open wounds or open sores. Avoid contact with your eyes, ears, mouth and genitals (private parts). Wash genitals (private parts) with your normal soap.  6. Wash thoroughly, paying special attention to the  area where your surgery will be performed.  7. Thoroughly rinse your body with warm water from the neck down.  8. DO NOT shower/wash with your normal soap after using and rinsing off the CHG Soap.  9. Pat yourself dry with a CLEAN TOWEL.   10. Wear CLEAN PAJAMAS   11. Place CLEAN SHEETS on your bed the night of your first shower and DO NOT SLEEP WITH PETS.    Day of Surgery: Do not apply any deodorants/lotions. Please wear clean clothes to the hospital/surgery center.      Please read over the following  fact sheets that you were given.

## 2017-04-01 ENCOUNTER — Encounter (HOSPITAL_COMMUNITY): Payer: Self-pay

## 2017-04-01 ENCOUNTER — Ambulatory Visit (HOSPITAL_BASED_OUTPATIENT_CLINIC_OR_DEPARTMENT_OTHER): Payer: Medicare Other | Admitting: Oncology

## 2017-04-01 ENCOUNTER — Encounter (HOSPITAL_COMMUNITY)
Admission: RE | Admit: 2017-04-01 | Discharge: 2017-04-01 | Disposition: A | Discharge status: Home / Self Care | Payer: Medicare Other | Source: Ambulatory Visit | Attending: Neurosurgery | Admitting: Neurosurgery

## 2017-04-01 ENCOUNTER — Other Ambulatory Visit (HOSPITAL_COMMUNITY): Payer: Self-pay | Admitting: Neurosurgery

## 2017-04-01 VITALS — BP 112/75 | HR 121 | Temp 98.4°F | Resp 18 | Ht 64.0 in | Wt 196.4 lb

## 2017-04-01 DIAGNOSIS — D649 Anemia, unspecified: Secondary | ICD-10-CM | POA: Diagnosis not present

## 2017-04-01 DIAGNOSIS — R000 Tachycardia, unspecified: Secondary | ICD-10-CM | POA: Diagnosis not present

## 2017-04-01 DIAGNOSIS — C9190 Lymphoid leukemia, unspecified not having achieved remission: Secondary | ICD-10-CM | POA: Diagnosis not present

## 2017-04-01 DIAGNOSIS — Z79811 Long term (current) use of aromatase inhibitors: Secondary | ICD-10-CM | POA: Insufficient documentation

## 2017-04-01 DIAGNOSIS — L658 Other specified nonscarring hair loss: Secondary | ICD-10-CM | POA: Diagnosis not present

## 2017-04-01 DIAGNOSIS — Z170 Estrogen receptor positive status [ER+]: Secondary | ICD-10-CM | POA: Diagnosis not present

## 2017-04-01 DIAGNOSIS — C50511 Malignant neoplasm of lower-outer quadrant of right female breast: Secondary | ICD-10-CM

## 2017-04-01 DIAGNOSIS — S32009K Unspecified fracture of unspecified lumbar vertebra, subsequent encounter for fracture with nonunion: Secondary | ICD-10-CM

## 2017-04-01 DIAGNOSIS — Z0183 Encounter for blood typing: Secondary | ICD-10-CM | POA: Insufficient documentation

## 2017-04-01 DIAGNOSIS — Z853 Personal history of malignant neoplasm of breast: Secondary | ICD-10-CM | POA: Insufficient documentation

## 2017-04-01 DIAGNOSIS — Z01812 Encounter for preprocedural laboratory examination: Secondary | ICD-10-CM | POA: Diagnosis not present

## 2017-04-01 DIAGNOSIS — E039 Hypothyroidism, unspecified: Secondary | ICD-10-CM | POA: Insufficient documentation

## 2017-04-01 DIAGNOSIS — D72820 Lymphocytosis (symptomatic): Secondary | ICD-10-CM | POA: Diagnosis not present

## 2017-04-01 DIAGNOSIS — Z905 Acquired absence of kidney: Secondary | ICD-10-CM | POA: Diagnosis not present

## 2017-04-01 DIAGNOSIS — Z9011 Acquired absence of right breast and nipple: Secondary | ICD-10-CM | POA: Diagnosis not present

## 2017-04-01 DIAGNOSIS — K219 Gastro-esophageal reflux disease without esophagitis: Secondary | ICD-10-CM | POA: Insufficient documentation

## 2017-04-01 DIAGNOSIS — Z79899 Other long term (current) drug therapy: Secondary | ICD-10-CM | POA: Insufficient documentation

## 2017-04-01 DIAGNOSIS — E785 Hyperlipidemia, unspecified: Secondary | ICD-10-CM | POA: Diagnosis not present

## 2017-04-01 DIAGNOSIS — C9110 Chronic lymphocytic leukemia of B-cell type not having achieved remission: Secondary | ICD-10-CM | POA: Diagnosis not present

## 2017-04-01 DIAGNOSIS — M549 Dorsalgia, unspecified: Secondary | ICD-10-CM

## 2017-04-01 DIAGNOSIS — Z01818 Encounter for other preprocedural examination: Secondary | ICD-10-CM | POA: Diagnosis not present

## 2017-04-01 DIAGNOSIS — Z981 Arthrodesis status: Secondary | ICD-10-CM | POA: Diagnosis not present

## 2017-04-01 HISTORY — DX: Unspecified malignant neoplasm of skin, unspecified: C44.90

## 2017-04-01 LAB — CBC WITH DIFFERENTIAL/PLATELET
BASOS PCT: 1 %
Basophils Absolute: 0.3 10*3/uL — ABNORMAL HIGH (ref 0.0–0.1)
EOS ABS: 0.3 10*3/uL (ref 0.0–0.7)
Eosinophils Relative: 1 %
HCT: 37.5 % (ref 36.0–46.0)
HEMOGLOBIN: 12.3 g/dL (ref 12.0–15.0)
LYMPHS ABS: 25.4 10*3/uL — AB (ref 0.7–4.0)
Lymphocytes Relative: 78 %
MCH: 27.2 pg (ref 26.0–34.0)
MCHC: 32.8 g/dL (ref 30.0–36.0)
MCV: 82.8 fL (ref 78.0–100.0)
MONO ABS: 1 10*3/uL (ref 0.1–1.0)
Monocytes Relative: 3 %
NEUTROS ABS: 5.5 10*3/uL (ref 1.7–7.7)
Neutrophils Relative %: 17 %
PLATELETS: 188 10*3/uL (ref 150–400)
RBC: 4.53 MIL/uL (ref 3.87–5.11)
RDW: 16 % — AB (ref 11.5–15.5)
WBC: 32.5 10*3/uL — ABNORMAL HIGH (ref 4.0–10.5)

## 2017-04-01 LAB — BASIC METABOLIC PANEL
ANION GAP: 12 (ref 5–15)
BUN: 18 mg/dL (ref 6–20)
CALCIUM: 9.7 mg/dL (ref 8.9–10.3)
CO2: 22 mmol/L (ref 22–32)
Chloride: 104 mmol/L (ref 101–111)
Creatinine, Ser: 1.44 mg/dL — ABNORMAL HIGH (ref 0.44–1.00)
GFR, EST AFRICAN AMERICAN: 41 mL/min — AB (ref >60)
GFR, EST NON AFRICAN AMERICAN: 36 mL/min — AB (ref >60)
Glucose, Bld: 134 mg/dL — ABNORMAL HIGH (ref 65–99)
Potassium: 5.6 mmol/L — ABNORMAL HIGH (ref 3.5–5.1)
Sodium: 138 mmol/L (ref 135–145)

## 2017-04-01 LAB — TYPE AND SCREEN
ABO/RH(D): O POS
Antibody Screen: NEGATIVE

## 2017-04-01 LAB — SURGICAL PCR SCREEN
MRSA, PCR: NEGATIVE
Staphylococcus aureus: NEGATIVE

## 2017-04-01 NOTE — Progress Notes (Signed)
ID: [name] [name] OB: 08/12/1946  MR#: [id number]  ZJI#:[id number]  PCP: Deland Pretty, MD GYN:  M. 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 performed a right lumpectomy and axillary lymph node dissection for what according to the patient and her family was a stage I invasive ductal breast cancer. She received radiation adjuvantly and then took tamoxifen for 5 years  In February of 2014 she had a normal screening mammography, but in November 2014 she felt a "hard lump" in her right breast. She brought this to the attention of her urologist, Dr. Izora Gala, and he set her up for bilateral diagnostic mammography with mammography at [facility] 07/13/2013. This showed her breast density to be category B. There was no mammographic abnormality noted but ultrasonography showed a 1.4 cm irregularly shaped solid mass in the right breast at the 8:00 position. This was biopsied the same day, and showed ([id number]) an invasive ductal carcinoma, grade 2, estrogen receptor 100% positive, progesterone receptor 13% positive, with an MIB-1 of 33% and HER-2 amplification with a HER-2: CEP 17 ratio of 3.19, and a HER-2 copy number percent all of 4.15.  The patient's subsequent history is as detailed below  INTERVAL HISTORY: [name] returns today for follow-up of her estrogen receptor positive breast cancer. She continues on anastrozole, with good tolerance. Hot flashes and vaginal dryness are not a major issue. She never developed the arthralgias or myalgias that many patients can experience on this medication. She obtains it at a good price.  As far as her CLL is concerned she has had no  intercurrent fevers, unexplained weight loss or unexplained fatigue, rash, drenching sweats, or adenopathy.  REVIEW OF SYSTEMS: [name] [name] is limited by severe back pain and tells me she is going to undergo further surgery next week under Dr. Trenton Gammon to repair some changes in the prior supports there. The pain makes her feel weak. She does not find oxycodone to be helpful or Aleve. She tells me her hair loss is improved with minoxidil. Aside from these issues a detailed review of systems today was stable   PAST MEDICAL HISTORY: Past Medical History:  Diagnosis Date  . Alopecia   . Anxiety   . Arthritis    "spine" (07/28/2013)  . Breast cancer ([facility]) 1997; 2014   right  . CAP (community acquired pneumonia)    admission 06-04-2013 secondary to failed oral antibitotics---  last cxr 06-16-2013  much improved  . Chronic back pain    "lower back and upper neck" (07/28/2013)  . CLL (chronic lymphocytic leukemia) ([facility]) dx'd 05/2013   "I don't think I have it though" (07/28/2013)  . DDD (degenerative disc disease)   . Deafness in left ear   . 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 ear   . Depression   . Diverticulosis   . Ecchymosis    FROM IV AND LAB WORK THE PAST WEEK  06-17-2013  . Ejection fraction   . Elevated LFTs   . Fibromyalgia 1978  . GERD (gastroesophageal reflux disease)   . Hematuria   . History of syncope    episode 2008--  no recurrence since  . HLD (hyperlipidemia)   . Hypothyroidism   .  Kidney stones 2014  . Leukocytosis   . Orthostatic hypotension    slight in the past  . Plantar fasciitis   . Ruptured disk    one in neck and two in back  . S/P chemotherapy, time since greater than 12 weeks   . Sensation of pressure in bladder area   . Sinus tachycardia    mild resting  . SOB (shortness of breath) 06-17-2013 CURRENTLY RESIDUAL FROM RECENT CAP   new since sore throats- 12/2014    . Stool incontinence    "at times recently"   . Ureteral calculi    bilateral  . Urgency of urination   .  Wears glasses     PAST SURGICAL HISTORY: Past Surgical History:  Procedure Laterality Date  . ABDOMINAL HYSTERECTOMY    . ANTERIOR LATERAL LUMBAR FUSION 4 LEVELS Left 04/25/2016   Procedure: LEFT LUMBAR ONE-TWO, LUMBAR TWO-THREE, LUMBAR THREE-FOUR, LUMBAR FOUR-FIVE ANTERIOR LATERAL LUMBAR FUSION;  Surgeon: Earnie Larsson, MD;  Location: [facility];  Service: Neurosurgery;  Laterality: Left;  . APPENDECTOMY  1997  . BREAST BIOPSY Right 2014  . BREAST LUMPECTOMY WITH AXILLARY LYMPH NODE DISSECTION Right 1997  . CYSTOSCOPY WITH RETROGRADE PYELOGRAM, URETEROSCOPY AND STENT PLACEMENT Bilateral 06/17/2013   Procedure: CYSTOSCOPY WITH RETROGRADE PYELOGRAM, URETEROSCOPY AND LEFT DOUBLE  J STENT PLACEMENT RIGHT URETERAL HOLMIIUM LASER AND DOUBLE J STENT ;  Surgeon: Hanley Ben, MD;  Location: [facility];  Service: Urology;  Laterality: Bilateral;  . CYSTOSCOPY WITH URETEROSCOPY, STONE BASKETRY AND STENT PLACEMENT Bilateral 2014  . HOLMIUM LASER APPLICATION Bilateral 76/72/0947   Procedure: HOLMIUM LASER APPLICATION;  Surgeon: Hanley Ben, MD;  Location: [facility];  Service: Urology;  Laterality: Bilateral;  . LITHOTRIPSY Left ~ 06/2013  . LUMBAR EPIDURAL INJECTION     has had 7 injections  . MASTECTOMY COMPLETE / SIMPLE Right 07/28/2013  . MASTECTOMY W/ NODES PARTIAL Right 1997  . PORT-A-CATH REMOVAL Left 10/03/2014   Procedure: REMOVAL PORT-A-CATH;  Surgeon: Donnie Mesa, MD;  Location: [facility];  Service: General;  Laterality: Left;  . PORTACATH PLACEMENT Left 07/28/2013   Procedure: ATTEMPTED INSERTION PORT-A-CATH;  Surgeon: Imogene Burn. Georgette Dover, MD;  Location: [facility];  Service: General;  Laterality: Left;  . POSTERIOR LUMBAR FUSION 4 LEVEL N/A 04/25/2016   Procedure: LUMBAR FIVE-SACRAL ONE POSTERIOR LUMBAR INTERBODY FUSION, THORACIC NINE-SACRAL ONE POSTERIOR LATERAL ARTHRODESIS WITH PEDICLE SCREWS;  Surgeon: Earnie Larsson, MD;  Location: [facility];   Service: Neurosurgery;  Laterality: N/A;  . RETINAL DETACHMENT SURGERY Right 2013  . ROBOTIC ASSITED PARTIAL NEPHRECTOMY Right 02/05/2015   Procedure: ROBOTIC ASSITED PARTIAL NEPHRECTOMY;  Surgeon: Raynelle Bring, MD;  Location: [facility];  Service: Urology;  Laterality: Right;  . SIMPLE MASTECTOMY WITH AXILLARY SENTINEL NODE BIOPSY Right 07/28/2013   Procedure: RIGHT TOTAL  MASTECTOMY;  Surgeon: Imogene Burn. Georgette Dover, MD;  Location: [facility];  Service: General;  Laterality: Right;  . TONSILLECTOMY  AGE 62  . TOTAL ABDOMINAL HYSTERECTOMY W/ BILATERAL SALPINGOOPHORECTOMY  1997  . TRANSTHORACIC ECHOCARDIOGRAM  05-11-2007  dr Ron Parker   normal lvf/  ef 65%    FAMILY HISTORY Family History  Problem Relation Age of Onset  . Heart disease Maternal Grandfather   . Diabetes Maternal Grandfather   . Colon cancer Maternal Aunt 79  . Brain cancer Paternal Grandmother        dx in 55s  . Dementia Mother   . Diabetes Mother   . Osteoporosis Mother   .  Diabetes Maternal Aunt   . Prostate cancer Father 70  . Bipolar disorder Maternal Aunt   . Stroke Maternal Aunt   . Stomach cancer Paternal Uncle        dx in late 9s   the patient's mother died at the age of 28. The patient's father is alive at age 63. She had no brothers or sisters. Her father has a history of prostate cancer. There is no history of breast or ovarian cancer in the family. One maternal first cousin has a history of Hodgkin's lymphoma.  GYNECOLOGIC HISTORY:  Menarche age 76, first live birth age 47. She is GX P1. She underwent total abdominal hysterectomy and bilateral salpingo-oophorectomy in 1997 she did not take hormone replacement.  SOCIAL HISTORY: (Updated  11/15/2013)  She is a homemaker. Her husband Arnette Norris farms approximately 500 acres, including quite a bit of tobacco. 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 The patient has no grandchildren. She has two "grand  cats". She attends a local united church of [place]: Not in place   HEALTH MAINTENANCE:  (Updated   11/15/2013 ) Social History  Substance Use Topics  . Smoking status: Never Smoker  . Smokeless tobacco: Never Used  . Alcohol use No     Comment: 07/28/2013 "no alcohol since 1994; never had problem w/it"     Colonoscopy: 2009/ [facility]  PAP: Status post hysterectomy  Bone density: May 10/14/2009 at [facility] was normal  Lipid panel:  Not on file   Allergies  Allergen Reactions  . Lasix [Furosemide] Nausea And Vomiting and Other (See Comments)    HEADACHE  . Lithium Other (See Comments)    Dizziness, "cause me to fall"  . Sulfa Antibiotics Hives  . Trazodone And Nefazodone Other (See Comments)    insomnia  . Lyrica [Pregabalin] Diarrhea, Nausea Only and Rash    Current Outpatient Prescriptions  Medication Sig Dispense Refill  . ALPRAZolam (XANAX) 0.5 MG tablet Take 0.5 mg by mouth 2 (two) times daily.     Marland Kitchen amitriptyline (ELAVIL) 150 MG tablet Take 150 mg by mouth at bedtime.    Marland Kitchen anastrozole (ARIMIDEX) 1 MG tablet TAKE 1 TABLET(1 MG) BY MOUTH DAILY 90 tablet 2  . Apoaequorin (PREVAGEN) 10 MG CAPS Take 10 mg by mouth daily.     . Cholecalciferol (VITAMIN D3 PO) Take 1 capsule by mouth daily.    . DULoxetine (CYMBALTA) 30 MG capsule Take 30 mg by mouth daily.    Marland Kitchen gabapentin (NEURONTIN) 300 MG capsule Take 337ms by mouth daily  4  . levothyroxine (SYNTHROID, LEVOTHROID) 50 MCG tablet TAKE 1 TABLET(50 MCG) BY MOUTH DAILY BEFORE BREAKFAST 90 tablet 0  . minoxidil (ROGAINE) 2 % external solution Apply 1 application topically daily.     .Marland Kitchenomeprazole (PRILOSEC) 40 MG capsule TAKE 1 CAPSULE(40 MG) BY MOUTH EVERY MORNING 90 capsule 0  . oxyCODONE-acetaminophen (PERCOCET) 5-325 MG tablet Take 1 tablet by mouth 2 (two) times daily as needed for severe pain.     .Marland KitchenREXULTI 4 MG TABS Take 4 mg by mouth daily.  0   No current facility-administered medications for this  visit.     OBJECTIVE: 71year old white woman In no acute distress  Vitals:   04/01/17 1050  BP: 112/75  Pulse: (!) 121  Resp: 18  Temp: 98.4 F (36.9 C)     Body mass index is 33.71 kg/m.  ECOG FS:2 - Symptomatic, <50% confined to bed Filed Weights   04/01/17 1050  Weight: 196 lb 6.4 oz (89.1 kg)   Repeat HR 114, completely regular  Sclerae unicteric, pupils round and equal Oropharynx clear and moist No cervical or supraclavicular adenopathy, no cervical or inguinal adenopathy. Lungs no rales or rhonchi Heart regular rate and rhythm Abd soft, obese, nontender, positive bowel sounds MSK no focal spinal tenderness, no upper extremity lymphedema Neuro: nonfocal, well oriented, appropriate affect Breasts: The right breast is status post mastectomy. There is no evidence of local recurrence. Left breast is benign.    LAB RESULTS: The LDH and beta-2 microglobulin are stable to minimally decreased  Lab Results  Component Value Date   WBC 31.1 (H) 03/25/2017   NEUTROABS 4.3 03/25/2017   HGB 11.1 (L) 03/25/2017   HCT 35.8 03/25/2017   MCV 85.6 03/25/2017   PLT 161 03/25/2017      Chemistry      Component Value Date/Time   NA 140 03/25/2017 1300   K 4.2 03/25/2017 1300   CL 108 05/05/2016 0620   CO2 24 03/25/2017 1300   BUN 17.9 03/25/2017 1300   CREATININE 1.1 03/25/2017 1300      Component Value Date/Time   CALCIUM 9.4 03/25/2017 1300   ALKPHOS 102 03/25/2017 1300   AST 41 (H) 03/25/2017 1300   ALT 52 03/25/2017 1300   BILITOT 0.36 03/25/2017 1300      STUDIES: Ct Lumbar Spine Wo Contrast  Result Date: 03/24/2017 CLINICAL DATA:  History of lumbar spine surgery with worsening pain. EXAM: CT LUMBAR SPINE WITHOUT CONTRAST TECHNIQUE: Multidetector CT imaging of the lumbar spine was performed without intravenous contrast administration. Multiplanar CT image reconstructions were also generated. COMPARISON:  CT lumbar spine 02/19/2017 FINDINGS: Segmentation:  Normal Alignment: Grade 1 L5-S1 anterolisthesis, unchanged. Vertebrae: Posterior spinal rods extend from T9-S1. There is interbody fusion at the L1-S1 levels. There is unchanged lucency surrounding the S1 screws. There is mild lucency surrounding the transpedicular screws at T9, right-greater-than-left. This level was not imaged on the prior study. Mild subsidence of the L5-S1 disc spacer is unchanged. There is no completed osseous fusion of the lumbar spine. Paraspinal and other soft tissues: There is a fat-containing mass of the right kidney, most consistent with angiomyolipoma. Disc levels: T8-T9:  Normal disc space without stenosis. T9-T10: Disc space narrowing without significant herniation or stenosis. T10-T11: Disc space narrowing without significant herniation. No stenosis. T11-T12:  Mild disc degeneration without stenosis. T12-L1:  Preserved disc space.  No stenosis. L1-L2: Postfusion changes without osseous completion. No spinal canal or neural foraminal stenosis. No spacer subsidence. L2-L3: Postfusion changes without complete osseous bridging. There is left-greater-than-right endplate spurring causing moderate left neural foraminal stenosis. No spinal canal stenosis. These findings are unchanged. L3-L4: Postfusion changes. No spinal canal stenosis. Moderate left neural foraminal stenosis, primarily due to facet and endplate hypertrophy. These findings are unchanged. L4-L5: Postfusion changes with wide patency of the thecal sac. Mild-to-moderate bilateral neural foraminal stenosis, primarily caused by endplate spurring. L5-S1:  No spinal canal or neural foraminal stenosis. IMPRESSION: 1. Posterior spinal rods from T9-S1 with L1-S1 interbody fusion. Unchanged lucency surrounding both S1 screws, consistent with loosening. Mild lucency surrounding the T9 screws, right worse than left, also suggestive of loosening. 2. Mild-to-moderate lower lumbar neural foraminal stenosis at L2-L3, L3-L4 and L4-L5,  unchanged. 3. No spinal canal stenosis. Electronically Signed   By: Ulyses Jarred M.D.   On: 03/24/2017 18:44  ASSESSMENT: 70 y.o. BRCA negative [place] woman  (1) status post right breast lumpectomy and axillary lymph node dissection in 1997 for a stage I breast cancer, treated with adjuvant radiation and tamoxifen for 5 years  (2) status post right breast lower outer quadrant biopsy 07/13/2013 for a clinical T1c N0, stage IA invasive ductal carcinoma, grade 2, estrogen receptor 100% positive, progesterone receptor 13% positive, with an MIB-1 of 33%, and HER-2 amplification by CISH with a HER2/CEP 17 ratio of 3.19, and an average HER-2 copy number per cell of 4.15  (3) status post right mastectomy 07/28/2013 for a pT1c pN0, stage IA invasive ductal carcinoma, grade 3, with close but negative margins. Prognostic panel was not repeated  (4) completed weekly paclitaxel x12 12/06/2913, with trastuzumab/ pertuzumab every 3 weeks; pertuzumab was held with third dose on 11/01/2013 due to diarrhea, tried a half dose on cycle 4 again with diarrhea developing  (5) trastuzumab (started 09/20/2013) continued for 1 year, last dose 09/21/2014;  (a) final echocardiogram 09/07/2014 showed an ejection fraction of 55-60%  (6) anastrozole started May 2015;   (a) bone density 12/15/2013 normal  (b) bone density 02/01/2016 at [facility] was normal with a T score of -1.0   OTHER PROBLEMS:  (a) History of chronic lymphoid leukemia diagnosed by flow cytometry 06/29/2013, the cells being CD5, CD20 and CD23 positive, CD10 negative.   (1) right renal mass resected on 02/05/15, consisting of atypical lymphoid proliferation composed of monotonous small lymphocytes   (b) Anemia with a normal MCV and normal ferritin-- B-12 and folate normal  (c) the patient met with Dr. Harlow Mares and has decided against reconstruction  PLAN: Maryelizabeth is now 3-1/2 years out from definitive surgery for her breast cancer with no evidence  of disease recurrence. This is very favorable.  She is tolerating anastrozole well and the plan is to continue this for a total of 5 years.  It is gratifying that her most recent bone density remains normal.  Her CLL is likewise stable. The axilla and lymphocyte count is actually slightly lower than before. The hemoglobin is significantly better of course. There is no indication for therapy at this point  I think the reason for the increased heart rate is minoxidil. She may be taking more than she realizes. I asked her to stop that medication for the next 2 weeks and then consider restarting at a lower dose. She can have her heart rate monitored through her primary care's office after that. I reassured her that there was no irregularity in her heart rate.  I'm going to see her again in 6 months. She knows to call for any problems that may develop before that visit.  Chauncey Cruel, MD  04/01/2017 11:36 AM

## 2017-04-01 NOTE — Progress Notes (Signed)
PCP - Deland Pretty Cardiologist - [place]  Chest x-ray - not needed  EKG - 04/01/17 Stress Test - 10/11/16 ECHO - 10/23/15 Cardiac Cath - denies  Called Angela about HR   Patient denies shortness of breath, fever, cough and chest pain at PAT appointment   Patient verbalized understanding of instructions that were given to them at the PAT appointment. Patient was also instructed that they will need to review over the PAT instructions again at home before surgery.

## 2017-04-02 LAB — PATHOLOGIST SMEAR REVIEW

## 2017-04-02 NOTE — Progress Notes (Signed)
Anesthesia Chart Review:  Pt is a 71 year old female scheduled for L5-S1 lumbar fusion revision with robot for guidance on 04/06/2017 with Earnie Larsson, MD.   - PCP is Deland Pretty, MD - Cardiologist is Candee Furbish, MD, last office visit 09/24/16 - Oncologist is Lurline Del, MD, last office visit 04/01/17.   PMH includes:  Sinus tachycardia, hyperlipidemia, hypothyroidism, syncope (2008), Breast cancer (s/p R mastectomy), CLL (s/p partial R nephrectomy), GERD. S/p PLIF 04/25/16. Never smoker. BMI 34.  Medications include: Arimidex, levothyroxine, minoxidil, Prilosec  BP 118/75   Pulse (!) 117 Comment: Jessica, RN aware  Temp 36.8 C (Oral)   Resp 18   Ht 5\' 4"  (1.626 m)   Wt 196 lb 10.4 oz (89.2 kg)   LMP 08/26/1995   SpO2 97%   BMI 33.76 kg/m   Preoperative labs reviewed.   - WBC 32.5; this is consistent with prior results/CLL - K 5.6.  K was 4.2 on 03/25/17.  - Cr 1.44, BUN 18. Prior results 03/25/17 showed Cr 1.1, BUN 17.9 - I notified Vanessa in Dr. Marchelle Folks office of elevated K, Cr. I also routed results to PCP. Will recheck BMET DOS.   EKG 04/01/17: Sinus tachycardia (107 bpm)  Echo 10/23/15:  1. LV cavity normal in size. Mild concentric LVH. Normal global wall motion. Grade I diastolic dysfunction. Calculated EF 57%. 2. Trace mitral regurgitation. 3. Trace tricuspid regurgitation. No evidence of pulmonary hypertension.  Nuclear stress test 10/12/15: - Myocardial perfusion imaging is normal. Overall LV systolic function normal without regional wall motion abnormalities. LVEF 59%.  Pt with hx of sinus tachycardia with prior work up, tests above.  Saw Dr. Jana Hakim prior to PAT 04/01/17; he felt pt's slight increase in HR from usual (was 117 bpm) was due to minoxidil. He asked pt to stop med.    If labs acceptable DOS, I anticipate pt can proceed with surgery as scheduled.   Willeen Cass, FNP-BC [facility]Anesthesiology Phone: [phone] 04/02/2017 2:57 PM

## 2017-04-05 MED ORDER — CEFAZOLIN SODIUM-DEXTROSE 2-4 GM/100ML-% IV SOLN
2.0000 g | INTRAVENOUS | Status: AC
  Administered 2017-04-06: 2 g via INTRAVENOUS
  Filled 2017-04-05: qty 100

## 2017-04-05 MED ORDER — DEXAMETHASONE SODIUM PHOSPHATE 10 MG/ML IJ SOLN
10.0000 mg | INTRAMUSCULAR | Status: AC
  Administered 2017-04-06: 10 mg via INTRAVENOUS
  Filled 2017-04-05: qty 1

## 2017-04-05 NOTE — Anesthesia Preprocedure Evaluation (Addendum)
Anesthesia Evaluation  Patient identified by MRN, date of birth, ID band Patient awake    Reviewed: Allergy & Precautions, NPO status , Patient's Chart, lab work & pertinent test results  Airway Mallampati: II  TM Distance: >3 FB Neck ROM: Full    Dental  (+) Dental Advisory Given   Pulmonary neg pulmonary ROS,    breath sounds clear to auscultation       Cardiovascular hypertension,  Rhythm:Regular Rate:Normal  Echo 10/23/15:  1. LV cavity normal in size. Mild concentric LVH. Normal global wall motion. Grade I diastolic dysfunction. Calculated EF 57%. 2. Trace mitral regurgitation. 3. Trace tricuspid regurgitation. No evidence of pulmonary hypertension.  Nuclear stress test 10/12/15: - Myocardial perfusion imaging is normal. Overall LV systolic function normal without regional wall motion abnormalities. LVEF 59%.   Neuro/Psych Anxiety Depression negative neurological ROS     GI/Hepatic Neg liver ROS, GERD  Medicated,  Endo/Other  Hypothyroidism   Renal/GU CRFRenal disease     Musculoskeletal  (+) Arthritis , Fibromyalgia -  Abdominal   Peds  Hematology negative hematology ROS (+)   Anesthesia Other Findings   Reproductive/Obstetrics                            Lab Results  Component Value Date   WBC 32.5 (H) 04/01/2017   HGB 12.3 04/01/2017   HCT 37.5 04/01/2017   MCV 82.8 04/01/2017   PLT 188 04/01/2017   Lab Results  Component Value Date   CREATININE 1.44 (H) 04/01/2017   BUN 18 04/01/2017   NA 138 04/01/2017   K 5.6 (H) 04/01/2017   CL 104 04/01/2017   CO2 22 04/01/2017    Anesthesia Physical Anesthesia Plan  ASA: III  Anesthesia Plan: General   Post-op Pain Management:    Induction: Intravenous  PONV Risk Score and Plan: 4 or greater and Ondansetron, Dexamethasone and Treatment may vary due to age or medical condition  Airway Management Planned: Oral  ETT  Additional Equipment:   Intra-op Plan:   Post-operative Plan: Extubation in OR  Informed Consent: I have reviewed the patients History and Physical, chart, labs and discussed the procedure including the risks, benefits and alternatives for the proposed anesthesia with the patient or authorized representative who has indicated his/her understanding and acceptance.   Dental advisory given  Plan Discussed with: CRNA  Anesthesia Plan Comments:        Anesthesia Quick Evaluation

## 2017-04-06 ENCOUNTER — Inpatient Hospital Stay (HOSPITAL_COMMUNITY)
Admission: RE | Admit: 2017-04-06 | Discharge: 2017-04-07 | DRG: 460 | Disposition: A | Discharge status: Home / Self Care | Payer: Medicare Other | Source: Ambulatory Visit | Attending: Neurosurgery | Admitting: Neurosurgery

## 2017-04-06 ENCOUNTER — Inpatient Hospital Stay (HOSPITAL_COMMUNITY): Payer: Medicare Other | Admitting: Emergency Medicine

## 2017-04-06 ENCOUNTER — Ambulatory Visit (HOSPITAL_COMMUNITY)
Admission: RE | Admit: 2017-04-06 | Discharge: 2017-04-06 | Disposition: A | Discharge status: Home / Self Care | Payer: Medicare Other | Source: Ambulatory Visit | Attending: Neurosurgery | Admitting: Neurosurgery

## 2017-04-06 ENCOUNTER — Ambulatory Visit (HOSPITAL_COMMUNITY)
Admission: RE | Disposition: A | Discharge status: Home / Self Care | Payer: Self-pay | Source: Ambulatory Visit | Attending: Neurosurgery

## 2017-04-06 ENCOUNTER — Inpatient Hospital Stay (HOSPITAL_COMMUNITY): Payer: Medicare Other

## 2017-04-06 ENCOUNTER — Encounter (HOSPITAL_COMMUNITY): Payer: Self-pay | Admitting: General Practice

## 2017-04-06 DIAGNOSIS — F419 Anxiety disorder, unspecified: Secondary | ICD-10-CM | POA: Diagnosis present

## 2017-04-06 DIAGNOSIS — Z8262 Family history of osteoporosis: Secondary | ICD-10-CM

## 2017-04-06 DIAGNOSIS — Z85828 Personal history of other malignant neoplasm of skin: Secondary | ICD-10-CM | POA: Diagnosis not present

## 2017-04-06 DIAGNOSIS — Z8042 Family history of malignant neoplasm of prostate: Secondary | ICD-10-CM

## 2017-04-06 DIAGNOSIS — Z9221 Personal history of antineoplastic chemotherapy: Secondary | ICD-10-CM

## 2017-04-06 DIAGNOSIS — I129 Hypertensive chronic kidney disease with stage 1 through stage 4 chronic kidney disease, or unspecified chronic kidney disease: Secondary | ICD-10-CM | POA: Diagnosis not present

## 2017-04-06 DIAGNOSIS — E039 Hypothyroidism, unspecified: Secondary | ICD-10-CM | POA: Diagnosis present

## 2017-04-06 DIAGNOSIS — Z87442 Personal history of urinary calculi: Secondary | ICD-10-CM | POA: Diagnosis not present

## 2017-04-06 DIAGNOSIS — Z9011 Acquired absence of right breast and nipple: Secondary | ICD-10-CM | POA: Diagnosis not present

## 2017-04-06 DIAGNOSIS — M4326 Fusion of spine, lumbar region: Secondary | ICD-10-CM | POA: Diagnosis not present

## 2017-04-06 DIAGNOSIS — Z833 Family history of diabetes mellitus: Secondary | ICD-10-CM

## 2017-04-06 DIAGNOSIS — Y838 Other surgical procedures as the cause of abnormal reaction of the patient, or of later complication, without mention of misadventure at the time of the procedure: Secondary | ICD-10-CM | POA: Diagnosis present

## 2017-04-06 DIAGNOSIS — Z419 Encounter for procedure for purposes other than remedying health state, unspecified: Secondary | ICD-10-CM

## 2017-04-06 DIAGNOSIS — Z905 Acquired absence of kidney: Secondary | ICD-10-CM

## 2017-04-06 DIAGNOSIS — K219 Gastro-esophageal reflux disease without esophagitis: Secondary | ICD-10-CM | POA: Diagnosis not present

## 2017-04-06 DIAGNOSIS — Z79811 Long term (current) use of aromatase inhibitors: Secondary | ICD-10-CM

## 2017-04-06 DIAGNOSIS — T84296A Other mechanical complication of internal fixation device of vertebrae, initial encounter: Secondary | ICD-10-CM | POA: Diagnosis not present

## 2017-04-06 DIAGNOSIS — F329 Major depressive disorder, single episode, unspecified: Secondary | ICD-10-CM | POA: Diagnosis not present

## 2017-04-06 DIAGNOSIS — Z79899 Other long term (current) drug therapy: Secondary | ICD-10-CM | POA: Diagnosis not present

## 2017-04-06 DIAGNOSIS — T84226A Displacement of internal fixation device of vertebrae, initial encounter: Principal | ICD-10-CM | POA: Diagnosis present

## 2017-04-06 DIAGNOSIS — S32009K Unspecified fracture of unspecified lumbar vertebra, subsequent encounter for fracture with nonunion: Secondary | ICD-10-CM | POA: Diagnosis present

## 2017-04-06 DIAGNOSIS — Z853 Personal history of malignant neoplasm of breast: Secondary | ICD-10-CM | POA: Diagnosis not present

## 2017-04-06 DIAGNOSIS — M960 Pseudarthrosis after fusion or arthrodesis: Secondary | ICD-10-CM | POA: Insufficient documentation

## 2017-04-06 DIAGNOSIS — E785 Hyperlipidemia, unspecified: Secondary | ICD-10-CM | POA: Diagnosis not present

## 2017-04-06 DIAGNOSIS — M797 Fibromyalgia: Secondary | ICD-10-CM | POA: Diagnosis not present

## 2017-04-06 DIAGNOSIS — N189 Chronic kidney disease, unspecified: Secondary | ICD-10-CM | POA: Diagnosis not present

## 2017-04-06 DIAGNOSIS — Z856 Personal history of leukemia: Secondary | ICD-10-CM

## 2017-04-06 HISTORY — PX: APPLICATION OF ROBOTIC ASSISTANCE FOR SPINAL PROCEDURE: SHX6753

## 2017-04-06 LAB — BASIC METABOLIC PANEL
Anion gap: 10 (ref 5–15)
BUN: 22 mg/dL — AB (ref 6–20)
CALCIUM: 9.4 mg/dL (ref 8.9–10.3)
CO2: 23 mmol/L (ref 22–32)
CREATININE: 1.16 mg/dL — AB (ref 0.44–1.00)
Chloride: 105 mmol/L (ref 101–111)
GFR calc Af Amer: 54 mL/min — ABNORMAL LOW (ref >60)
GFR, EST NON AFRICAN AMERICAN: 46 mL/min — AB (ref >60)
GLUCOSE: 99 mg/dL (ref 65–99)
POTASSIUM: 4.3 mmol/L (ref 3.5–5.1)
Sodium: 138 mmol/L (ref 135–145)

## 2017-04-06 SURGERY — POSTERIOR LUMBAR FUSION 1 LEVEL
Anesthesia: General | Site: Back

## 2017-04-06 MED ORDER — ROCURONIUM BROMIDE 10 MG/ML (PF) SYRINGE
PREFILLED_SYRINGE | INTRAVENOUS | Status: AC
  Filled 2017-04-06: qty 5

## 2017-04-06 MED ORDER — CHLORHEXIDINE GLUCONATE CLOTH 2 % EX PADS: 6.0000 | MEDICATED_PAD | Freq: Once | CUTANEOUS | Status: DC

## 2017-04-06 MED ORDER — LACTATED RINGERS IV SOLN
INTRAVENOUS | Status: DC
  Administered 2017-04-06 (×3): via INTRAVENOUS

## 2017-04-06 MED ORDER — DIAZEPAM 5 MG PO TABS
5.0000 mg | ORAL_TABLET | Freq: Four times a day (QID) | ORAL | Status: DC | PRN
  Administered 2017-04-07: 5 mg via ORAL
  Filled 2017-04-06: qty 1

## 2017-04-06 MED ORDER — BREXPIPRAZOLE 4 MG PO TABS: 4.0000 mg | ORAL_TABLET | Freq: Every day | ORAL | Status: DC

## 2017-04-06 MED ORDER — MIDAZOLAM HCL 5 MG/5ML IJ SOLN
INTRAMUSCULAR | Status: DC | PRN
  Administered 2017-04-06: 2 mg via INTRAVENOUS

## 2017-04-06 MED ORDER — GABAPENTIN 300 MG PO CAPS
300.0000 mg | ORAL_CAPSULE | Freq: Every day | ORAL | Status: DC
  Administered 2017-04-06: 300 mg via ORAL
  Filled 2017-04-06: qty 1

## 2017-04-06 MED ORDER — LIDOCAINE 2% (20 MG/ML) 5 ML SYRINGE
INTRAMUSCULAR | Status: DC | PRN
  Administered 2017-04-06: 60 mg via INTRAVENOUS

## 2017-04-06 MED ORDER — FENTANYL CITRATE (PF) 100 MCG/2ML IJ SOLN
INTRAMUSCULAR | Status: AC
  Administered 2017-04-06: 50 ug via INTRAVENOUS
  Filled 2017-04-06: qty 2

## 2017-04-06 MED ORDER — OXYCODONE-ACETAMINOPHEN 5-325 MG PO TABS
1.0000 | ORAL_TABLET | Freq: Two times a day (BID) | ORAL | Status: DC | PRN
  Administered 2017-04-06 – 2017-04-07 (×3): 1 via ORAL
  Filled 2017-04-06 (×3): qty 1

## 2017-04-06 MED ORDER — MENTHOL 3 MG MT LOZG
1.0000 | LOZENGE | OROMUCOSAL | Status: DC | PRN
Start: 2017-04-06 — End: 2017-04-07

## 2017-04-06 MED ORDER — SUFENTANIL CITRATE 50 MCG/ML IV SOLN
INTRAVENOUS | Status: DC | PRN
  Administered 2017-04-06: 20 ug via INTRAVENOUS
  Administered 2017-04-06 (×2): 5 ug via INTRAVENOUS

## 2017-04-06 MED ORDER — VITAMIN D3 25 MCG (1000 UNIT) PO TABS
1000.0000 [IU] | ORAL_TABLET | Freq: Every day | ORAL | Status: DC
  Administered 2017-04-06 – 2017-04-07 (×2): 1000 [IU] via ORAL
  Filled 2017-04-06 (×4): qty 1

## 2017-04-06 MED ORDER — KETOROLAC TROMETHAMINE 30 MG/ML IJ SOLN
INTRAMUSCULAR | Status: DC | PRN
  Administered 2017-04-06: 15 mg via INTRAVENOUS

## 2017-04-06 MED ORDER — THROMBIN 20000 UNITS EX SOLR
CUTANEOUS | Status: AC
  Filled 2017-04-06: qty 20000

## 2017-04-06 MED ORDER — BREXPIPRAZOLE 4 MG PO TABS
4.0000 mg | ORAL_TABLET | Freq: Every day | ORAL | Status: DC
  Filled 2017-04-06: qty 1

## 2017-04-06 MED ORDER — THROMBIN 20000 UNITS EX SOLR
CUTANEOUS | Status: DC | PRN
  Administered 2017-04-06: 09:00:00 via TOPICAL

## 2017-04-06 MED ORDER — PHENYLEPHRINE 40 MCG/ML (10ML) SYRINGE FOR IV PUSH (FOR BLOOD PRESSURE SUPPORT)
PREFILLED_SYRINGE | INTRAVENOUS | Status: DC | PRN
  Administered 2017-04-06 (×3): 80 ug via INTRAVENOUS

## 2017-04-06 MED ORDER — SUFENTANIL CITRATE 50 MCG/ML IV SOLN
INTRAVENOUS | Status: AC
  Filled 2017-04-06: qty 1

## 2017-04-06 MED ORDER — PHENYLEPHRINE 40 MCG/ML (10ML) SYRINGE FOR IV PUSH (FOR BLOOD PRESSURE SUPPORT)
PREFILLED_SYRINGE | INTRAVENOUS | Status: AC
  Filled 2017-04-06: qty 10

## 2017-04-06 MED ORDER — FENTANYL CITRATE (PF) 100 MCG/2ML IJ SOLN
25.0000 ug | INTRAMUSCULAR | Status: DC | PRN
  Administered 2017-04-06: 50 ug via INTRAVENOUS
  Administered 2017-04-06 (×2): 25 ug via INTRAVENOUS

## 2017-04-06 MED ORDER — CEFAZOLIN SODIUM-DEXTROSE 1-4 GM/50ML-% IV SOLN
1.0000 g | Freq: Three times a day (TID) | INTRAVENOUS | Status: AC
  Administered 2017-04-06 (×2): 1 g via INTRAVENOUS
  Filled 2017-04-06: qty 50

## 2017-04-06 MED ORDER — LIDOCAINE 2% (20 MG/ML) 5 ML SYRINGE
INTRAMUSCULAR | Status: AC
  Filled 2017-04-06: qty 5

## 2017-04-06 MED ORDER — LEVOTHYROXINE SODIUM 100 MCG PO TABS
50.0000 ug | ORAL_TABLET | Freq: Every day | ORAL | Status: DC
  Administered 2017-04-07: 50 ug via ORAL
  Filled 2017-04-06: qty 1

## 2017-04-06 MED ORDER — BACITRACIN 50000 UNITS IM SOLR
INTRAMUSCULAR | Status: DC | PRN
  Administered 2017-04-06: 09:00:00

## 2017-04-06 MED ORDER — BUPIVACAINE HCL (PF) 0.5 % IJ SOLN
INTRAMUSCULAR | Status: AC
  Filled 2017-04-06: qty 30

## 2017-04-06 MED ORDER — HYDROMORPHONE HCL 1 MG/ML IJ SOLN: 0.5000 mg | INTRAMUSCULAR | Status: DC | PRN

## 2017-04-06 MED ORDER — PROPOFOL 10 MG/ML IV BOLUS
INTRAVENOUS | Status: DC | PRN
  Administered 2017-04-06: 130 mg via INTRAVENOUS

## 2017-04-06 MED ORDER — 0.9 % SODIUM CHLORIDE (POUR BTL) OPTIME
TOPICAL | Status: DC | PRN
  Administered 2017-04-06: 1000 mL

## 2017-04-06 MED ORDER — SODIUM CHLORIDE 0.9% FLUSH: 3.0000 mL | INTRAVENOUS | Status: DC | PRN

## 2017-04-06 MED ORDER — ONDANSETRON HCL 4 MG/2ML IJ SOLN
INTRAMUSCULAR | Status: DC | PRN
  Administered 2017-04-06: 4 mg via INTRAVENOUS

## 2017-04-06 MED ORDER — VANCOMYCIN HCL 1000 MG IV SOLR
INTRAVENOUS | Status: DC | PRN
  Administered 2017-04-06: 1000 mg via TOPICAL

## 2017-04-06 MED ORDER — BUPIVACAINE HCL (PF) 0.25 % IJ SOLN
INTRAMUSCULAR | Status: DC | PRN
  Administered 2017-04-06: 20 mL

## 2017-04-06 MED ORDER — SODIUM CHLORIDE 0.9% FLUSH
3.0000 mL | Freq: Two times a day (BID) | INTRAVENOUS | Status: DC
  Administered 2017-04-06: 3 mL via INTRAVENOUS

## 2017-04-06 MED ORDER — ONDANSETRON HCL 4 MG/2ML IJ SOLN: 4.0000 mg | Freq: Four times a day (QID) | INTRAMUSCULAR | Status: DC | PRN

## 2017-04-06 MED ORDER — HYDROCODONE-ACETAMINOPHEN 10-325 MG PO TABS
1.0000 | ORAL_TABLET | ORAL | Status: DC | PRN
  Administered 2017-04-06 – 2017-04-07 (×3): 1 via ORAL
  Filled 2017-04-06 (×2): qty 1
  Filled 2017-04-06: qty 2

## 2017-04-06 MED ORDER — ANASTROZOLE 1 MG PO TABS
1.0000 mg | ORAL_TABLET | Freq: Every day | ORAL | Status: DC
Start: 2017-04-06 — End: 2017-04-07
  Administered 2017-04-06 – 2017-04-07 (×2): 1 mg via ORAL
  Filled 2017-04-06 (×2): qty 1

## 2017-04-06 MED ORDER — KETOROLAC TROMETHAMINE 30 MG/ML IJ SOLN
INTRAMUSCULAR | Status: AC
  Filled 2017-04-06: qty 1

## 2017-04-06 MED ORDER — AMITRIPTYLINE HCL 75 MG PO TABS
150.0000 mg | ORAL_TABLET | Freq: Every day | ORAL | Status: DC
  Administered 2017-04-06: 150 mg via ORAL
  Filled 2017-04-06 (×2): qty 2

## 2017-04-06 MED ORDER — AMITRIPTYLINE HCL 75 MG PO TABS: 150.0000 mg | ORAL_TABLET | Freq: Every day | ORAL | Status: DC

## 2017-04-06 MED ORDER — ROCURONIUM BROMIDE 10 MG/ML (PF) SYRINGE
PREFILLED_SYRINGE | INTRAVENOUS | Status: DC | PRN
  Administered 2017-04-06: 50 mg via INTRAVENOUS
  Administered 2017-04-06: 20 mg via INTRAVENOUS

## 2017-04-06 MED ORDER — SODIUM CHLORIDE 0.9 % IV SOLN: 250.0000 mL | INTRAVENOUS | Status: DC

## 2017-04-06 MED ORDER — BREXPIPRAZOLE 2 MG PO TABS
4.0000 mg | ORAL_TABLET | Freq: Every day | ORAL | Status: DC
  Administered 2017-04-07: 4 mg via ORAL
  Filled 2017-04-06 (×2): qty 4

## 2017-04-06 MED ORDER — ONDANSETRON HCL 4 MG/2ML IJ SOLN
INTRAMUSCULAR | Status: AC
  Filled 2017-04-06: qty 2

## 2017-04-06 MED ORDER — PHENOL 1.4 % MT LIQD: 1.0000 | OROMUCOSAL | Status: DC | PRN

## 2017-04-06 MED ORDER — MIDAZOLAM HCL 2 MG/2ML IJ SOLN
INTRAMUSCULAR | Status: AC
  Filled 2017-04-06: qty 2

## 2017-04-06 MED ORDER — ALPRAZOLAM 0.5 MG PO TABS
0.5000 mg | ORAL_TABLET | Freq: Two times a day (BID) | ORAL | Status: DC
  Administered 2017-04-06 – 2017-04-07 (×3): 0.5 mg via ORAL
  Filled 2017-04-06 (×3): qty 1

## 2017-04-06 MED ORDER — SODIUM CHLORIDE 0.9 % IJ SOLN
INTRAMUSCULAR | Status: AC
  Filled 2017-04-06: qty 10

## 2017-04-06 MED ORDER — DULOXETINE HCL 30 MG PO CPEP
30.0000 mg | ORAL_CAPSULE | Freq: Every day | ORAL | Status: DC
  Administered 2017-04-06 – 2017-04-07 (×2): 30 mg via ORAL
  Filled 2017-04-06 (×2): qty 1

## 2017-04-06 MED ORDER — PANTOPRAZOLE SODIUM 40 MG PO TBEC
40.0000 mg | DELAYED_RELEASE_TABLET | Freq: Every day | ORAL | Status: DC
  Administered 2017-04-06 – 2017-04-07 (×2): 40 mg via ORAL
  Filled 2017-04-06 (×2): qty 1

## 2017-04-06 MED ORDER — VANCOMYCIN HCL 1000 MG IV SOLR
INTRAVENOUS | Status: AC
  Filled 2017-04-06: qty 1000

## 2017-04-06 MED ORDER — ONDANSETRON HCL 4 MG PO TABS: 4.0000 mg | ORAL_TABLET | Freq: Four times a day (QID) | ORAL | Status: DC | PRN

## 2017-04-06 SURGICAL SUPPLY — 80 items
ADH SKN CLS APL DERMABOND .7 (GAUZE/BANDAGES/DRESSINGS) ×2
APL SKNCLS STERI-STRIP NONHPOA (GAUZE/BANDAGES/DRESSINGS) ×2
BAG DECANTER FOR FLEXI CONT (MISCELLANEOUS) ×3 IMPLANT
BENZOIN TINCTURE PRP APPL 2/3 (GAUZE/BANDAGES/DRESSINGS) ×3 IMPLANT
BIT DRILL LONG 3.0X30 (BIT) IMPLANT
BIT DRILL LONG 3X80 (BIT) IMPLANT
BIT DRILL LONG 4X80 (BIT) ×1 IMPLANT
BIT DRILL SHORT 3.0X30 (BIT) IMPLANT
BIT DRILL SHORT 3X80 (BIT) IMPLANT
BLADE CLIPPER SURG (BLADE) IMPLANT
BLADE SURG 11 STRL SS (BLADE) ×3 IMPLANT
BUR CUTTER 7.0 ROUND (BURR) IMPLANT
BUR MATCHSTICK NEURO 3.0 LAGG (BURR) ×3 IMPLANT
CANISTER SUCT 3000ML PPV (MISCELLANEOUS) ×3 IMPLANT
CARTRIDGE OIL MAESTRO DRILL (MISCELLANEOUS) ×2 IMPLANT
CONT SPEC 4OZ CLIKSEAL STRL BL (MISCELLANEOUS) ×3 IMPLANT
COVER BACK TABLE 60X90IN (DRAPES) ×3 IMPLANT
DECANTER SPIKE VIAL GLASS SM (MISCELLANEOUS) ×3 IMPLANT
DERMABOND ADVANCED (GAUZE/BANDAGES/DRESSINGS) ×1
DERMABOND ADVANCED .7 DNX12 (GAUZE/BANDAGES/DRESSINGS) ×2 IMPLANT
DIFFUSER DRILL AIR PNEUMATIC (MISCELLANEOUS) ×3 IMPLANT
DRAPE C-ARM 42X72 X-RAY (DRAPES) ×5 IMPLANT
DRAPE C-ARMOR (DRAPES) ×1 IMPLANT
DRAPE HALF SHEET 40X57 (DRAPES) IMPLANT
DRAPE LAPAROTOMY 100X72X124 (DRAPES) ×3 IMPLANT
DRAPE POUCH INSTRU U-SHP 10X18 (DRAPES) ×3 IMPLANT
DRAPE SHEET LG 3/4 BI-LAMINATE (DRAPES) ×3 IMPLANT
DRAPE SURG 17X23 STRL (DRAPES) ×12 IMPLANT
DRSG OPSITE POSTOP 4X8 (GAUZE/BANDAGES/DRESSINGS) ×1 IMPLANT
DURAPREP 26ML APPLICATOR (WOUND CARE) ×3 IMPLANT
ELECT BLADE 4.0 EZ CLEAN MEGAD (MISCELLANEOUS)
ELECT REM PT RETURN 9FT ADLT (ELECTROSURGICAL) ×3
ELECTRODE BLDE 4.0 EZ CLN MEGD (MISCELLANEOUS) IMPLANT
ELECTRODE REM PT RTRN 9FT ADLT (ELECTROSURGICAL) ×2 IMPLANT
EVACUATOR 1/8 PVC DRAIN (DRAIN) ×2 IMPLANT
GAUZE SPONGE 4X4 12PLY STRL (GAUZE/BANDAGES/DRESSINGS) ×3 IMPLANT
GAUZE SPONGE 4X4 16PLY XRAY LF (GAUZE/BANDAGES/DRESSINGS) ×1 IMPLANT
GLOVE ECLIPSE 9.0 STRL (GLOVE) ×6 IMPLANT
GLOVE EXAM NITRILE LRG STRL (GLOVE) IMPLANT
GLOVE EXAM NITRILE XL STR (GLOVE) IMPLANT
GLOVE EXAM NITRILE XS STR PU (GLOVE) IMPLANT
GOWN STRL REUS W/ TWL LRG LVL3 (GOWN DISPOSABLE) IMPLANT
GOWN STRL REUS W/ TWL XL LVL3 (GOWN DISPOSABLE) ×4 IMPLANT
GOWN STRL REUS W/TWL 2XL LVL3 (GOWN DISPOSABLE) IMPLANT
GOWN STRL REUS W/TWL LRG LVL3 (GOWN DISPOSABLE)
GOWN STRL REUS W/TWL XL LVL3 (GOWN DISPOSABLE) ×6
GRAFT BN 10X1XDBM MAGNIFUSE (Bone Implant) IMPLANT
GRAFT BONE MAGNIFUSE 1X10CM (Bone Implant) ×3 IMPLANT
GUIDEWIRE 18IN BLUNT CD HORIZ (WIRE) ×2 IMPLANT
KIT BASIN OR (CUSTOM PROCEDURE TRAY) ×3 IMPLANT
KIT INFUSE SMALL (Orthopedic Implant) ×1 IMPLANT
KIT ROOM TURNOVER OR (KITS) ×3 IMPLANT
KIT SPINE MAZOR X ROBO DISP (MISCELLANEOUS) ×3 IMPLANT
MILL MEDIUM DISP (BLADE) ×3 IMPLANT
NEEDLE HYPO 22GX1.5 SAFETY (NEEDLE) ×3 IMPLANT
NS IRRIG 1000ML POUR BTL (IV SOLUTION) ×3 IMPLANT
OIL CARTRIDGE MAESTRO DRILL (MISCELLANEOUS) ×3
PACK LAMINECTOMY NEURO (CUSTOM PROCEDURE TRAY) ×3 IMPLANT
PIN HEAD 2.5X60MM (PIN) IMPLANT
RASP 3.0MM (RASP) ×1 IMPLANT
ROD PEDICLE SOLERA 5.5X110 (Rod) ×1 IMPLANT
ROD SPINAL 5.5X90 COBALT (Rod) ×1 IMPLANT
SCREW CANN MA SOLERA 8.5X70 (Screw) ×2 IMPLANT
SCREW LOCK RELINE 5.5 TULIP (Screw) ×5 IMPLANT
SCREW SCHANZ SA 4.0MM (MISCELLANEOUS) ×1 IMPLANT
SCREW SET SOLERA (Screw) ×12 IMPLANT
SCREW SET SOLERA TI5.5 (Screw) IMPLANT
SCREW SOLERA 8.5X40 (Screw) ×1 IMPLANT
SCREW SOLERA 9.5X40 (Screw) ×1 IMPLANT
SPONGE SURGIFOAM ABS GEL 100 (HEMOSTASIS) ×3 IMPLANT
STRIP CLOSURE SKIN 1/2X4 (GAUZE/BANDAGES/DRESSINGS) ×5 IMPLANT
SUT VIC AB 0 CT1 18XCR BRD8 (SUTURE) ×4 IMPLANT
SUT VIC AB 0 CT1 8-18 (SUTURE) ×6
SUT VIC AB 2-0 CT1 18 (SUTURE) ×3 IMPLANT
SUT VIC AB 3-0 SH 8-18 (SUTURE) ×6 IMPLANT
TOWEL GREEN STERILE (TOWEL DISPOSABLE) ×3 IMPLANT
TOWEL GREEN STERILE FF (TOWEL DISPOSABLE) ×3 IMPLANT
TRAY FOLEY W/METER SILVER 16FR (SET/KITS/TRAYS/PACK) ×3 IMPLANT
TUBE MAZOR SA REDUCTION (TUBING) ×1 IMPLANT
WATER STERILE IRR 1000ML POUR (IV SOLUTION) ×3 IMPLANT

## 2017-04-06 NOTE — Transfer of Care (Signed)
Immediate Anesthesia Transfer of Care Note  Patient: [name] [name]  Procedure(s) Performed: Procedure(s): Lumbar Fusion Revision - Lumbar five-Sacral one with robot for guidance (N/A) APPLICATION OF ROBOTIC ASSISTANCE FOR SPINAL PROCEDURE (N/A)  Patient Location: PACU  Anesthesia Type:General  Level of Consciousness: awake, oriented and patient cooperative  Airway & Oxygen Therapy: Patient Spontanous Breathing and Patient connected to nasal cannula oxygen  Post-op Assessment: Report given to RN, Post -op Vital signs reviewed and stable and Patient moving all extremities  Post vital signs: Reviewed and stable  Last Vitals:  Vitals:   04/06/17 0612 04/06/17 0634  BP: (!) 136/102 (!) 106/57  Pulse: 82   Resp: 20   SpO2: 100%     Last Pain:  Vitals:   04/06/17 0634  PainSc: 7       Patients Stated Pain Goal: 3 (77/82/42 [id number])  Complications: No apparent anesthesia complications

## 2017-04-06 NOTE — Brief Op Note (Signed)
04/06/2017  10:51 AM  PATIENT:  [name] [name]  71 y.o. female  PRE-OPERATIVE DIAGNOSIS:  Pseudoarthrosis  POST-OPERATIVE DIAGNOSIS:  Pseudoarthrosis  PROCEDURE:  Procedure(s): Lumbar Fusion Revision - Lumbar five-Sacral one with robot for guidance (N/A) APPLICATION OF ROBOTIC ASSISTANCE FOR SPINAL PROCEDURE (N/A)  SURGEON:  Surgeon(s) and Role:    * Earnie Larsson, MD - Primary    * Ditty, Kevan Ny, MD - Assisting  PHYSICIAN ASSISTANT:   ASSISTANTS:    ANESTHESIA:   general  EBL:  Total I/O In: 1000 [I.V.:1000] Out: 490 [Urine:240; Blood:250]  BLOOD ADMINISTERED:none  DRAINS: none   LOCAL MEDICATIONS USED:  MARCAINE     SPECIMEN:  No Specimen  DISPOSITION OF SPECIMEN:  N/A  COUNTS:  YES  TOURNIQUET:  * No tourniquets in log *  DICTATION: .Dragon Dictation  PLAN OF CARE: Admit to inpatient   PATIENT DISPOSITION:  PACU - hemodynamically stable.   Delay start of Pharmacological VTE agent (>24hrs) due to surgical blood loss or risk of bleeding: yes

## 2017-04-06 NOTE — Anesthesia Postprocedure Evaluation (Signed)
Anesthesia Post Note  Patient: [name] [name]  Procedure(s) Performed: Procedure(s) (LRB): Lumbar Fusion Revision - Lumbar five-Sacral one with robot for guidance (N/A) APPLICATION OF ROBOTIC ASSISTANCE FOR SPINAL PROCEDURE (N/A)     Patient location during evaluation: PACU Anesthesia Type: General Level of consciousness: awake and alert Pain management: pain level controlled Vital Signs Assessment: post-procedure vital signs reviewed and stable Respiratory status: spontaneous breathing, nonlabored ventilation, respiratory function stable and patient connected to nasal cannula oxygen Cardiovascular status: blood pressure returned to baseline and stable Postop Assessment: no signs of nausea or vomiting Anesthetic complications: no    Last Vitals:  Vitals:   04/06/17 1200 04/06/17 1233  BP: (!) 111/54 130/69  Pulse: 86 97  Resp: 11 18  Temp: 36.7 C 36.6 C  SpO2: 96% 100%    Last Pain:  Vitals:   04/06/17 1233  TempSrc: Oral  PainSc:                  Tiajuana Amass

## 2017-04-06 NOTE — Anesthesia Procedure Notes (Addendum)
Procedure Name: Intubation Date/Time: 04/06/2017 8:06 AM Performed by: Melina Copa, Corinn Stoltzfus R Pre-anesthesia Checklist: Patient identified, Emergency Drugs available, Suction available and Patient being monitored Patient Re-evaluated:Patient Re-evaluated prior to induction Oxygen Delivery Method: Circle System Utilized Preoxygenation: Pre-oxygenation with 100% oxygen Induction Type: IV induction Ventilation: Mask ventilation without difficulty Laryngoscope Size: Mac and 3 Grade View: Grade II Tube type: Oral Tube size: 7.5 mm Number of attempts: 1 Airway Equipment and Method: Stylet Placement Confirmation: ETT inserted through vocal cords under direct vision,  positive ETCO2 and breath sounds checked- equal and bilateral Secured at: 20 cm Tube secured with: Tape Dental Injury: Teeth and Oropharynx as per pre-operative assessment

## 2017-04-06 NOTE — Evaluation (Signed)
Occupational Therapy Evaluation Patient Details Name: [name] [name] MRN: [id number] DOB: 09-13-1945 Today's Date: 04/06/2017    History of Present Illness 71 year old female who is just under 1 year status post T9-S1 decompression and fusion. Patient with progressive lumbosacral pain worse with standing and walking. 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 S/p revision lumbar fusion on 04/06/17.     Clinical Impression   PTA, pt was living with her husband and was independent. Pt requiring Mod A for donning back brace, Min Guard for toileting, and Min Guard A for functional mobility. Pt would benefit form further acute OT to address to address LB ADLs and facilitate safe dc. Recommend dc home once medically stable per physician.     Follow Up Recommendations  No OT follow up;Supervision - Intermittent    Equipment Recommendations  3 in 1 bedside commode    Recommendations for Other Services PT consult     Precautions / Restrictions Precautions Precautions: Fall;Back Precaution Booklet Issued: Yes (comment) Precaution Comments: Provided handout and reviewed all information. Pt able to recall 2/3 from prior back surgery.  Required Braces or Orthoses: Spinal Brace Spinal Brace: Thoracolumbosacral orthotic;Applied in sitting position Restrictions Weight Bearing Restrictions: No      Mobility Bed Mobility Overal bed mobility: Needs Assistance Bed Mobility: Rolling;Sidelying to Sit;Sit to Sidelying Rolling: Min assist Sidelying to sit: Min assist     Sit to sidelying: Min assist General bed mobility comments: Min A for safety and maintain back precautions. Feel pt will progress well with time  Transfers Overall transfer level: Needs assistance Equipment used: None Transfers: Sit to/from Stand Sit to Stand: Min guard         General transfer comment: Min Guard for safety    Balance Overall balance assessment:  Needs assistance Sitting-balance support: No upper extremity supported;Feet supported Sitting balance-Leahy Scale: Good     Standing balance support: No upper extremity supported;During functional activity Standing balance-Leahy Scale: Fair Standing balance comment: Able to maintain standing without UE support. Min guard for safety                           ADL either performed or assessed with clinical judgement   ADL Overall ADL's : Needs assistance/impaired Eating/Feeding: Set up;Sitting   Grooming: Wash/dry face;Min guard;Standing   Upper Body Bathing: Sitting;Minimal assistance Upper Body Bathing Details (indicate cue type and reason): Discussed benefits of hand held shower head Lower Body Bathing: Maximal assistance;Sit to/from stand Lower Body Bathing Details (indicate cue type and reason): Will need education on LB bathing with AE Upper Body Dressing : Moderate assistance;Sitting Upper Body Dressing Details (indicate cue type and reason): Mod A to don and position brace Lower Body Dressing: Maximal assistance;Sit to/from stand Lower Body Dressing Details (indicate cue type and reason): Will need education on LB dressing with AE Toilet Transfer: Min guard;Ambulation;BSC (BSC over toilet) Toilet Transfer Details (indicate cue type and reason): Min Guard for safety.  Toileting- Water quality scientist and Hygiene: Min guard;Sit to/from stand Toileting - Water quality scientist Details (indicate cue type and reason): Min Guard for Psychologist, prison and probation services Details (indicate cue type and reason): Educated pt on use of 3N1 for shower chair Functional mobility during ADLs: Min guard General ADL Comments: Pt requiring Mod A to don/position brace and will require educaiton for LB ADLs. Educated pt on use of BSC for toileting and shower chair. Pt and family agree  that 3N1 would increase safety at home.      Vision Baseline Vision/History: Wears glasses Wears Glasses: At  all times Patient Visual Report: No change from baseline       Perception     Praxis      Pertinent Vitals/Pain Pain Assessment: Faces Faces Pain Scale: Hurts even more Pain Location: Back Pain Descriptors / Indicators: Constant;Discomfort;Grimacing Pain Intervention(s): Monitored during session;Repositioned;Premedicated before session     Hand Dominance Right   Extremity/Trunk Assessment Upper Extremity Assessment Upper Extremity Assessment: Overall WFL for tasks assessed   Lower Extremity Assessment Lower Extremity Assessment: Defer to PT evaluation   Cervical / Trunk Assessment Cervical / Trunk Assessment: Other exceptions (s/p T9-S1 fusion) Cervical / Trunk Exceptions: s/p T9-S1 fusion   Communication Communication Communication: HOH   Cognition Arousal/Alertness: Awake/alert Behavior During Therapy: WFL for tasks assessed/performed Overall Cognitive Status: Impaired/Different from baseline Area of Impairment: Following commands;Problem solving                       Following Commands: Follows multi-step commands inconsistently;Follows one step commands with increased time     Problem Solving: Requires verbal cues;Slow processing General Comments: Suspect decreased problem solving due to medications. Pt requiring increased VCs and would perseverate on certain topics.    General Comments  Pt husband and sister present for session    Exercises     Shoulder Instructions      Home Living Family/patient expects to be discharged to:: Private residence Living Arrangements: Spouse/significant other Available Help at Discharge: Family;Available PRN/intermittently Type of Home: House Home Access: Stairs to enter [employer] of Steps: 3 Entrance Stairs-Rails: None Home Layout: One level     Bathroom Shower/Tub: Occupational psychologist: Standard ([place] low)     Home Equipment: Environmental consultant - 2 wheels;Cane - single point           Prior Functioning/Environment Level of Independence: Independent        Comments: Reports having back surgery in September and went to SNF at dc. Reports being independent after transitioning home from rehab.        OT Problem List: Decreased activity tolerance;Impaired balance (sitting and/or standing);Decreased safety awareness;Decreased knowledge of use of DME or AE;Decreased knowledge of precautions;Pain      OT Treatment/Interventions: Self-care/ADL training;Therapeutic exercise;Energy conservation;DME and/or AE instruction;Therapeutic activities;Patient/family education    OT Goals(Current goals can be found in the care plan section) Acute Rehab OT Goals Patient Stated Goal: Go home OT Goal Formulation: With patient Time For Goal Achievement: 04/20/17 Potential to Achieve Goals: Good ADL Goals Pt Will Perform Lower Body Bathing: sit to/from stand;with adaptive equipment;with min guard assist Pt Will Perform Lower Body Dressing: sit to/from stand;with adaptive equipment;with min guard assist Pt Will Perform Tub/Shower Transfer: Shower transfer;3 in 1;ambulating;with min guard assist  OT Frequency: Min 2X/week   Barriers to D/C:            Co-evaluation              AM-PAC PT "6 Clicks" Daily Activity     Outcome Measure Help from another person eating meals?: None Help from another person taking care of personal grooming?: None Help from another person toileting, which includes using toliet, bedpan, or urinal?: A Little Help from another person bathing (including washing, rinsing, drying)?: A Little Help from another person to put on and taking off regular upper body clothing?: A Little Help from another person to put on  and taking off regular lower body clothing?: A Lot 6 Click Score: 19   End of Session Equipment Utilized During Treatment: Back brace Nurse Communication: Mobility status;Precautions;Other (comment) (IV beeping)  Activity Tolerance:  Patient tolerated treatment well Patient left: in bed;with call bell/phone within reach;with family/visitor present  OT Visit Diagnosis: Unsteadiness on feet (R26.81);Pain Pain - Right/Left:  (Back) Pain - part of body:  (Back)                Time: 4496-7591 OT Time Calculation (min): 27 min Charges:  OT General Charges $OT Visit: 1 Procedure OT Evaluation $OT Eval Low Complexity: 1 Procedure OT Treatments $Self Care/Home Management : 8-22 mins G-Codes:     Kaidyn Hernandes MSOT, OTR/L Acute Rehab Pager: [phone] Office: [place] 04/06/2017, 4:47 PM

## 2017-04-06 NOTE — Op Note (Signed)
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  Revision lumbar sacral pelvic fusion with segmental pedicle screw fixation and new placement of bilateral S1 pedicle screws and bilateral S2 alar iliac screws utilizing intraoperative robotic guidance  L5-S1 augmentation of posterior lateral arthrodesis utilizing bone morphogenic protein and morcellized allograft  Surgeon:Harper Smoker A.Shamarcus Hoheisel, M.D.  Asst. Surgeon: Ditty  Anesthesia: General  Indication: 71 year old female status post T9-S1 decompression and fusion. Patient with progressive lumbar sacral pain. 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 Patient presents now for revision fusion with addition of iliac fixation.  Operative note: After induction of anesthesia, patient position prone and a properly padded. Lumbar sacral region prepped and draped sterilely. Incision made from L3-S1 bilaterally. Dissection performed bilaterally. Previously placed pedicle screw fixation from L3 S1 was identified and dissected free. Entry site into the S2 pedicle was dissected free. The Medtronic surgical robot was then registered and images were merged with intraoperative fluoroscopy for a 3-D guidance model of her lumbosacral spine. This was achieved by placing a right-sided posterior superior iliac spine pin for localization. Once the surgical trajectories were confirmed the robot was used to place bilateral S2 alar iliac screws which had been previously mapped into the software. Screws trajectory was drilled and then tapped. 8.5 mm x 70 mm Medtronic screws were placed bilaterally at S2 into the iliac wing. Both screws were placed without difficulty  with good purchase. Previous pedicle screw construct was disassembled at L5-S1 and L4 on the left and L3 L4 L5 and S1 on the right. Rod was cut above the pedicle screw construct on the right L3 and above the construct on the left at L4. Rod was removed. Loosened screws at S1 were removed. Screws at L4-L5 and the right-sided screw at L3 were all confirmed to be solid with good purchase. Pedicle screws were replaced through the loosened screw track of S1 bilaterally. 9.5 mm screws were placed on the left at S1 and 8.5 mm screws were placed on the right and S1. Short segment of titanium rod was then contoured and placed over the screw heads at L3-L4 L5-S1 and S2 on the right. Locking caps were then applied and then the construct was given a final tightening with the construct under mild compression at the L5-S1 level. Titanium rod was contoured and placed or the screws at L4-L5 and S1 and S2 on the left. Locking caps once again applied. Locking caps engaged. Final images revealed good position of the lumbar sacral pelvic fixation with normal lamina spine. Transverse processes and sacroiliac were dissected free. These were decorticated. Morselize autograft mixed with bone morphogenic protein soaked sponges and allograft packets were packed posterior laterally for later fusion. Vancomycin powder was placed the deep wound space. Wounds and close in layers with Vicryl sutures. Steri-Strips sterile dressing were applied. No apparent complications. Patient tolerated the procedure well and she returns to the recovery room postop.

## 2017-04-06 NOTE — H&P (Signed)
[name] [name] is an 71 y.o. female.   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 Patient with progressive lumbosacral pain worse with standing and walking. 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 Patient presents now for revision lumbosacral pelvic fusion in hopes of improving her symptoms.  Past Medical History:  Diagnosis Date  . Alopecia   . Anxiety   . Arthritis    "spine" (07/28/2013)  . Breast cancer ([place]) 1997; 2014   right  . CAP (community acquired pneumonia)    admission 06-04-2013 secondary to failed oral antibitotics---  last cxr 06-16-2013  much improved  . Chronic back pain    "lower back and upper neck" (07/28/2013)  . 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 DDD (degenerative disc disease)   . Deafness in left ear   . 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 ear   . Depression   . Diverticulosis   . Ecchymosis    FROM IV AND LAB WORK THE PAST WEEK  06-17-2013  . Ejection fraction   . Elevated LFTs   . Fibromyalgia 1978  . GERD (gastroesophageal reflux disease)   . Hematuria   . History of kidney stones   . History of syncope    episode 2008--  no recurrence since  . HLD (hyperlipidemia)   . Hypothyroidism   . Kidney stones 2014  . Leukocytosis   . Orthostatic hypotension    slight in the past  . Plantar fasciitis   . Ruptured disk    one in neck and two in back  . S/P chemotherapy, time since greater than 12 weeks   . Sensation of pressure in bladder area   . Sinus tachycardia    mild resting  . Skin cancer   . SOB (shortness of breath) 06-17-2013 CURRENTLY RESIDUAL FROM RECENT CAP   new since sore throats- 12/2014    . Stool incontinence    "at times recently"   . Ureteral calculi    bilateral  . Urgency of urination   .  Wears glasses     Past Surgical History:  Procedure Laterality Date  . ABDOMINAL HYSTERECTOMY    . ANTERIOR LATERAL LUMBAR FUSION 4 LEVELS Left 04/25/2016   Procedure: LEFT LUMBAR ONE-TWO, LUMBAR TWO-THREE, LUMBAR THREE-FOUR, LUMBAR FOUR-FIVE ANTERIOR LATERAL LUMBAR FUSION;  Surgeon: Earnie Larsson, MD;  Location: [facility];  Service: Neurosurgery;  Laterality: Left;  . APPENDECTOMY  1997  . BREAST BIOPSY Right 2014  . BREAST LUMPECTOMY WITH AXILLARY LYMPH NODE DISSECTION Right 1997  . CYSTOSCOPY WITH RETROGRADE PYELOGRAM, URETEROSCOPY AND STENT PLACEMENT Bilateral 06/17/2013   Procedure: CYSTOSCOPY WITH RETROGRADE PYELOGRAM, URETEROSCOPY AND LEFT DOUBLE  J STENT PLACEMENT RIGHT URETERAL HOLMIIUM LASER AND DOUBLE J STENT ;  Surgeon: Hanley Ben, MD;  Location: [facility];  Service: Urology;  Laterality: Bilateral;  . CYSTOSCOPY WITH URETEROSCOPY, STONE BASKETRY AND STENT PLACEMENT Bilateral 2014  . HOLMIUM LASER APPLICATION Bilateral 08/65/7846   Procedure: HOLMIUM LASER APPLICATION;  Surgeon: Hanley Ben, MD;  Location: [facility];  Service: Urology;  Laterality: Bilateral;  . LITHOTRIPSY Left ~ 06/2013  . LUMBAR EPIDURAL INJECTION     has had 7 injections  . MASTECTOMY COMPLETE / SIMPLE Right 07/28/2013  . MASTECTOMY W/ NODES PARTIAL Right 1997  . PORT-A-CATH REMOVAL Left 10/03/2014  Procedure: REMOVAL PORT-A-CATH;  Surgeon: Donnie Mesa, MD;  Location: [facility];  Service: General;  Laterality: Left;  . PORTACATH PLACEMENT Left 07/28/2013   Procedure: ATTEMPTED INSERTION PORT-A-CATH;  Surgeon: Imogene Burn. Georgette Dover, MD;  Location: [facility];  Service: General;  Laterality: Left;  . POSTERIOR LUMBAR FUSION 4 LEVEL N/A 04/25/2016   Procedure: LUMBAR FIVE-SACRAL ONE POSTERIOR LUMBAR INTERBODY FUSION, THORACIC NINE-SACRAL ONE POSTERIOR LATERAL ARTHRODESIS WITH PEDICLE SCREWS;  Surgeon: Earnie Larsson, MD;  Location: [facility];  Service: Neurosurgery;   Laterality: N/A;  . RETINAL DETACHMENT SURGERY Right 2013  . ROBOTIC ASSITED PARTIAL NEPHRECTOMY Right 02/05/2015   Procedure: ROBOTIC ASSITED PARTIAL NEPHRECTOMY;  Surgeon: Raynelle Bring, MD;  Location: [facility];  Service: Urology;  Laterality: Right;  . SIMPLE MASTECTOMY WITH AXILLARY SENTINEL NODE BIOPSY Right 07/28/2013   Procedure: RIGHT TOTAL  MASTECTOMY;  Surgeon: Imogene Burn. Georgette Dover, MD;  Location: [facility];  Service: General;  Laterality: Right;  . TONSILLECTOMY  AGE 6  . TOTAL ABDOMINAL HYSTERECTOMY W/ BILATERAL SALPINGOOPHORECTOMY  1997  . TRANSTHORACIC ECHOCARDIOGRAM  05-11-2007  dr Ron Parker   normal lvf/  ef 65%    Family History  Problem Relation Age of Onset  . Heart disease Maternal Grandfather   . Diabetes Maternal Grandfather   . Colon cancer Maternal Aunt 79  . Brain cancer Paternal Grandmother        dx in 9s  . Dementia Mother   . Diabetes Mother   . Osteoporosis Mother   . Diabetes Maternal Aunt   . Prostate cancer Father 40  . Bipolar disorder Maternal Aunt   . Stroke Maternal Aunt   . Stomach cancer Paternal Uncle        dx in late 76s   Social History:  reports that she has never smoked. She has never used smokeless tobacco. She reports that she does not drink alcohol or use drugs.  Allergies:  Allergies  Allergen Reactions  . Lasix [Furosemide] Nausea And Vomiting and Other (See Comments)    HEADACHE  . Lithium Other (See Comments)    Dizziness, "cause me to fall"  . Sulfa Antibiotics Hives  . Trazodone And Nefazodone Other (See Comments)    insomnia  . Lyrica [Pregabalin] Diarrhea, Nausea Only and Rash    Medications Prior to Admission  Medication Sig Dispense Refill  . ALPRAZolam (XANAX) 0.5 MG tablet Take 0.5 mg by mouth 2 (two) times daily.     [place] Kitchen amitriptyline (ELAVIL) 150 MG tablet Take 150 mg by mouth at bedtime.    [place] Kitchen anastrozole (ARIMIDEX) 1 MG tablet TAKE 1 TABLET(1 MG) BY MOUTH DAILY 90 tablet 2  . Cholecalciferol (VITAMIN D3 PO) Take 1 capsule  by mouth daily.    . DULoxetine (CYMBALTA) 30 MG capsule Take 30 mg by mouth daily.    [place] Kitchen gabapentin (NEURONTIN) 300 MG capsule Take 326ms by mouth daily  4  . levothyroxine (SYNTHROID, LEVOTHROID) 50 MCG tablet TAKE 1 TABLET(50 MCG) BY MOUTH DAILY BEFORE BREAKFAST 90 tablet 0  . minoxidil (ROGAINE) 2 % external solution Apply 1 application topically daily.     .[place] Kitchenomeprazole (PRILOSEC) 40 MG capsule TAKE 1 CAPSULE(40 MG) BY MOUTH EVERY MORNING 90 capsule 0  . oxyCODONE-acetaminophen (PERCOCET) 5-325 MG tablet Take 1 tablet by mouth 2 (two) times daily as needed for severe pain.     .[place] KitchenREXULTI 4 MG TABS Take 4 mg by mouth daily.  0    Results for orders placed or performed during  the hospital encounter of 04/06/17 (from the past 48 hour(s))  Basic metabolic panel     Status: Abnormal   Collection Time: 04/06/17  6:43 AM  Result Value Ref Range   Sodium 138 135 - 145 mmol/L   Potassium 4.3 3.5 - 5.1 mmol/L   Chloride 105 101 - 111 mmol/L   CO2 23 22 - 32 mmol/L   Glucose, Bld 99 65 - 99 mg/dL   BUN 22 (H) 6 - 20 mg/dL   Creatinine, Ser 1.16 (H) 0.44 - 1.00 mg/dL   Calcium 9.4 8.9 - 10.3 mg/dL   GFR calc non Af Amer 46 (L) >60 mL/min   GFR calc Af Amer 54 (L) >60 mL/min    Comment: (NOTE) The eGFR has been calculated using the CKD EPI equation. This calculation has not been validated in all clinical situations. eGFR's persistently <60 mL/min signify possible Chronic Kidney Disease.    Anion gap 10 5 - 15   No results found.  Pertinent items noted in HPI and remainder of comprehensive ROS otherwise negative.  Blood pressure (!) 106/57, pulse 82, resp. rate 20, height '5\' 4"'  (1.626 m), weight 89.2 kg (196 lb 10.4 oz), last menstrual period 08/26/1995, SpO2 100 %.  Patient is awake and alert. She is oriented and appropriate. Cranial nerve function intact. Motor and sensory function extremities normal. Wound clean dry and intact. Examination head ears eyes and throat is unremarkable.  Chest and abdomen benign. Extremities free from injury or deformity. Assessment/Plan L5-S1 pseudoarthrosis with hardware failure. Plan reexploration of lumbosacral fusion with revision including sacral pelvic fixation. Risks and benefits of been explained. Patient wishes to proceed.  Shakti Fleer A 04/06/2017, 7:52 AM

## 2017-04-07 MED ORDER — DIAZEPAM 5 MG PO TABS: 5.0000 mg | ORAL_TABLET | Freq: Four times a day (QID) | ORAL | 0 refills | Status: DC | PRN

## 2017-04-07 MED ORDER — OXYCODONE-ACETAMINOPHEN 5-325 MG PO TABS: 1.0000 | ORAL_TABLET | ORAL | 0 refills | Status: DC | PRN

## 2017-04-07 NOTE — Evaluation (Addendum)
Occupational Therapy Treatment Patient Details Name: [name] [name] MRN: [id number] DOB: 07-14-1946 Today's Date: 04/07/2017    History of Present Illness 71 year old female who is just under 1 year status post T9-S1 decompression and fusion. Patient with progressive lumbosacral pain worse with standing and walking. 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 S/p revision lumbar fusion on 04/06/17.    Clinical Impression   Pt progressing towards established goals. Pt performed dressing with AE demonstrating good adherence to back precautions and understanding of education. Educated pt on use of 3N1 for toilet and shower transfers; pt verbalized understanding. Provided all education and answered pt questions in preparation for dc later today. Continues to recommend dc home. All acute OT need met and will sign off.     Follow Up Recommendations  No OT follow up;Supervision - Intermittent    Equipment Recommendations  3 in 1 bedside commode    Recommendations for Other Services PT consult     Precautions / Restrictions Precautions Precautions: Fall;Back Precaution Booklet Issued: Yes (comment) Precaution Comments: Pt recalled 3/3 back precautions independently Required Braces or Orthoses: Spinal Brace Spinal Brace: Thoracolumbosacral orthotic;Applied in sitting position Restrictions Weight Bearing Restrictions: No      Mobility Bed Mobility               General bed mobility comments: Pt in recliner upon arrival  Transfers Overall transfer level: Needs assistance Equipment used: None Transfers: Sit to/from Stand Sit to Stand: Min guard         General transfer comment: Min Guard for safety    Balance Overall balance assessment: Needs assistance Sitting-balance support: No upper extremity supported;Feet supported Sitting balance-Leahy Scale: Good     Standing balance support: No upper extremity  supported;During functional activity Standing balance-Leahy Scale: Fair Standing balance comment: Able to maintain standing without UE support.                            ADL either performed or assessed with clinical judgement   ADL Overall ADL's : Needs assistance/impaired           Upper Body Bathing Details (indicate cue type and reason): Discussed use of hand held shower head for bathing   Lower Body Bathing Details (indicate cue type and reason): Educated pt on using AE for bathing Upper Body Dressing : Sitting;Minimal assistance Upper Body Dressing Details (indicate cue type and reason): Pt donned shirt with set up and benefits from [place] A for correct positioning of brace. Lower Body Dressing: Sit to/from stand;Min guard;With adaptive equipment;Adhering to back precautions Lower Body Dressing Details (indicate cue type and reason): Pt donned underwear, pants, and socks with AE. Demonstrated good and safe technique while adhereing to back precautions   Toilet Transfer Details (indicate cue type and reason): Discussed use of 3N1       Tub/Shower Transfer Details (indicate cue type and reason): Discussed use of 3N1 Functional mobility during ADLs: Min guard General ADL Comments: Pt dmeonstrating good understanding of back precautions. provided handout with AE. Pt reports that she has AE from previous back surgery     Vision         Perception     Praxis      Pertinent Vitals/Pain Pain Assessment: Faces Faces Pain Scale: Hurts little more Pain Location: Back Pain Descriptors / Indicators: Constant;Discomfort;Grimacing Pain Intervention(s): Monitored during session     Hand Dominance  Extremity/Trunk Assessment             Communication     Cognition Arousal/Alertness: Awake/alert Behavior During Therapy: WFL for tasks assessed/performed Overall Cognitive Status: Within Functional Limits for tasks assessed                                      General Comments       Exercises     Shoulder Instructions      Home Living                                          Prior Functioning/Environment                   OT Problem List:        OT Treatment/Interventions:      OT Goals(Current goals can be found in the care plan section) Acute Rehab OT Goals Patient Stated Goal: Go home OT Goal Formulation: With patient Time For Goal Achievement: 04/20/17 Potential to Achieve Goals: Good ADL Goals Pt Will Perform Lower Body Bathing: sit to/from stand;with adaptive equipment;with min guard assist Pt Will Perform Lower Body Dressing: sit to/from stand;with adaptive equipment;with min guard assist Pt Will Perform Tub/Shower Transfer: Shower transfer;3 in 1;ambulating;with min guard assist  OT Frequency: Min 2X/week   Barriers to D/C:            Co-evaluation              AM-PAC PT "6 Clicks" Daily Activity     Outcome Measure Help from another person eating meals?: None Help from another person taking care of personal grooming?: None Help from another person toileting, which includes using toliet, bedpan, or urinal?: A Little Help from another person bathing (including washing, rinsing, drying)?: A Little Help from another person to put on and taking off regular upper body clothing?: A Little Help from another person to put on and taking off regular lower body clothing?: A Little 6 Click Score: 20   End of Session Equipment Utilized During Treatment: Back brace Nurse Communication: Mobility status;Precautions  Activity Tolerance: Patient tolerated treatment well Patient left: with call bell/phone within reach;with family/visitor present;in chair  OT Visit Diagnosis: Unsteadiness on feet (R26.81);Pain Pain - Right/Left:  (Back) Pain - part of body:  (Back)                Time: 9509-3267 OT Time Calculation (min): 20 min Charges:  OT General Charges $OT Visit: 1  Procedure OT Treatments $Self Care/Home Management : 8-22 mins G-Codes:     [place], OTR/L Acute Rehab Pager: [phone] Office: [place] 04/07/2017, 8:13 AM

## 2017-04-07 NOTE — Progress Notes (Signed)
Patient alert and oriented, mae's well, voiding adequate amount of urine, swallowing without difficulty,  c/o mild pain at time of discharge. Patient discharged home with family. Script and discharged instructions given to patient. Patient and family stated understanding of instructions given. Patient has an appointment with Dr. Pool.  

## 2017-04-07 NOTE — Evaluation (Signed)
Physical Therapy Evaluation Patient Details Name: [name] [name] MRN: [id number] DOB: May 14, 1946 Today's Date: 04/07/2017   History of Present Illness  71 year old female who is just under 1 year status post T9-S1 decompression and fusion. Patient with progressive lumbosacral pain worse with standing and walking. 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 S/p revision lumbar fusion on 04/06/17.   Clinical Impression  Pt admitted with above diagnosis. Pt currently with functional limitations due to the deficits listed below (see PT Problem List). At the time of PT eval pt was able to perform transfers and ambulation with up to min assist without the RW, and gross supervision for safety with the RW. Bilateral hip pain limiting independence with mobility. Pt will benefit from skilled PT to increase their independence and safety with mobility to allow discharge to the venue listed below.       Follow Up Recommendations No PT follow up;Supervision for mobility/OOB    Equipment Recommendations  Rolling walker with 5" wheels;3in1 (PT)    Recommendations for Other Services       Precautions / Restrictions Precautions Precautions: Fall;Back Precaution Booklet Issued: Yes (comment) Precaution Comments: Pt recalled 3/3 back precautions independently Required Braces or Orthoses: Spinal Brace Spinal Brace: Lumbar corset;Applied in sitting position Restrictions Weight Bearing Restrictions: No      Mobility  Bed Mobility               General bed mobility comments: Pt in recliner with LE's elevated upon arrival. Pt was repositioned with feet on floor and educated on proper sitting positions.   Transfers Overall transfer level: Needs assistance Equipment used: None Transfers: Sit to/from Stand Sit to Stand: Min guard;Supervision         General transfer comment: Min Guard for safety initially and without AD. By end of session and  with RW, pt was at a supervision level.   Ambulation/Gait Ambulation/Gait assistance: Supervision;Min assist Ambulation Distance (Feet): 200 Feet Assistive device: Rolling walker (2 wheeled);1 person hand held assist Gait Pattern/deviations: Step-through pattern;Decreased stride length;Trunk flexed Gait velocity: Decreased Gait velocity interpretation: Below normal speed for age/gender General Gait Details: Pt with increased bilateral hip pain with ambulation. Initially without AD and therapist provided HHA for balance support and safety. Pt was given a RW and had immediate improvement in reported pain, and overall balance. At a supervision level with the RW.   Stairs Stairs: Yes Stairs assistance: Min guard Stair Management: One rail Right;Step to pattern;Forwards Number of Stairs: 2 General stair comments: At end of stair training pt reports she has 3 STE in the front of her house with bilateral rails. Recommended use of front stairs for safety.   Wheelchair Mobility    Modified Rankin (Stroke Patients Only)       Balance Overall balance assessment: Needs assistance Sitting-balance support: No upper extremity supported;Feet supported Sitting balance-Leahy Scale: Good     Standing balance support: No upper extremity supported;During functional activity Standing balance-Leahy Scale: Fair Standing balance comment: Able to maintain standing without UE support.                              Pertinent Vitals/Pain Pain Assessment: Faces Faces Pain Scale: Hurts little more Pain Location: Back Pain Descriptors / Indicators: Constant;Discomfort;Grimacing Pain Intervention(s): Monitored during session    Home Living Family/patient expects to be discharged to:: Private residence Living Arrangements: Spouse/significant other Available Help at Discharge:  Family;Available PRN/intermittently Type of Home: House Home Access: Stairs to enter Entrance Stairs-Rails:  Right;Left;Can reach both (porch; none in back) Entrance Stairs-Number of Steps: 2 back - 3 porch Home Layout: One level Home Equipment: Walker - 2 wheels;Cane - single point      Prior Function Level of Independence: Independent         Comments: Reports having back surgery in September and went to SNF at dc. Reports being independent after transitioning home from rehab.     Hand Dominance   Dominant Hand: Right    Extremity/Trunk Assessment   Upper Extremity Assessment Upper Extremity Assessment: Overall WFL for tasks assessed    Lower Extremity Assessment Lower Extremity Assessment: Generalized weakness (and pain consistent with pre-op diagnosis)    Cervical / Trunk Assessment Cervical / Trunk Assessment: Other exceptions (s/p T9-S1 fusion) Cervical / Trunk Exceptions: s/p T9-S1 fusion  Communication   Communication: HOH  Cognition Arousal/Alertness: Awake/alert Behavior During Therapy: WFL for tasks assessed/performed Overall Cognitive Status: Within Functional Limits for tasks assessed                                        General Comments      Exercises     Assessment/Plan    PT Assessment Patient needs continued PT services  PT Problem List Decreased strength;Decreased range of motion;Decreased activity tolerance;Decreased balance;Decreased mobility;Decreased knowledge of use of DME;Decreased safety awareness;Decreased knowledge of precautions;Pain       PT Treatment Interventions DME instruction;Gait training;Stair training;Functional mobility training;Therapeutic activities;Therapeutic exercise;Neuromuscular re-education;Patient/family education    PT Goals (Current goals can be found in the Care Plan section)  Acute Rehab PT Goals Patient Stated Goal: Go home PT Goal Formulation: With patient Time For Goal Achievement: 04/14/17 Potential to Achieve Goals: Good    Frequency Min 5X/week   Barriers to discharge         Co-evaluation               AM-PAC PT "6 Clicks" Daily Activity  Outcome Measure Difficulty turning over in bed (including adjusting bedclothes, sheets and blankets)?: None Difficulty moving from lying on back to sitting on the side of the bed? : None Difficulty sitting down on and standing up from a chair with arms (e.g., wheelchair, bedside commode, etc,.)?: None Help needed moving to and from a bed to chair (including a wheelchair)?: None Help needed walking in hospital room?: None Help needed climbing 3-5 steps with a railing? : None 6 Click Score: 24    End of Session Equipment Utilized During Treatment: Back brace Activity Tolerance: Patient tolerated treatment well Patient left: in chair;with call bell/phone within reach Nurse Communication: Mobility status PT Visit Diagnosis: Unsteadiness on feet (R26.81);Pain;Other symptoms and signs involving the nervous system (R29.898) Pain - part of body:  (back)    Time: 0623-7628 PT Time Calculation (min) (ACUTE ONLY): 18 min   Charges:   PT Evaluation $PT Eval Moderate Complexity: 1 Mod     PT G Codes:        Rolinda Roan, PT, DPT Acute Rehabilitation Services Pager: [phone]   Thelma Comp 04/07/2017, 8:46 AM

## 2017-04-07 NOTE — Discharge Instructions (Signed)

## 2017-04-07 NOTE — Discharge Summary (Signed)
Physician Discharge Summary  Patient ID: [name] [name] MRN: [id number] DOB/AGE: October 18, 1945 71 y.o.  Admit date: 04/06/2017 Discharge date: 04/07/2017  Admission Diagnoses:  Discharge Diagnoses:  Active Problems:   Closed pseudoarthrosis of lumbar spine [facility])   Discharged Condition: good  Hospital Course: Patient admitted to the hospital where she underwent uncomplicated revision of her lumbar sacral fusion with extension and sacral pelvic fixation. Postoperatively doing very well. Ambulating without difficulty. Ready for discharge home.  Consults:   Significant Diagnostic Studies:   Treatments:   Discharge Exam: Blood pressure (!) 92/53, pulse 79, temperature 98.7 F (37.1 C), temperature source Oral, resp. rate 18, height 5\' 4"  (1.626 m), weight 88.9 kg (196 lb), last menstrual period 08/26/1995, SpO2 95 %. Awake and alert. Oriented and appropriate. Cranial nerve function intact. Motor and sensory function extremities normal. Wound clean and dry. Chest and abdomen normal.  Disposition: 01-Home or Self Care   Allergies as of 04/07/2017      Reactions   Lasix [furosemide] Nausea And Vomiting, Other (See Comments)   HEADACHE   Lithium Other (See Comments)   Dizziness, "cause me to fall"   Sulfa Antibiotics Hives   Trazodone And Nefazodone Other (See Comments)   insomnia   Lyrica [pregabalin] Diarrhea, Nausea Only, Rash      Medication List    TAKE these medications   ALPRAZolam 0.5 MG tablet Commonly known as:  XANAX Take 0.5 mg by mouth 2 (two) times daily.   amitriptyline 150 MG tablet Commonly known as:  ELAVIL Take 150 mg by mouth at bedtime.   anastrozole 1 MG tablet Commonly known as:  ARIMIDEX TAKE 1 TABLET(1 MG) BY MOUTH DAILY   diazepam 5 MG tablet Commonly known as:  VALIUM Take 1-2 tablets (5-10 mg total) by mouth every 6 (six) hours as needed for muscle spasms.   DULoxetine 30 MG capsule Commonly known as:  CYMBALTA Take 30 mg by mouth  daily.   gabapentin 300 MG capsule Commonly known as:  NEURONTIN Take 300mg s by mouth daily   levothyroxine 50 MCG tablet Commonly known as:  SYNTHROID, LEVOTHROID TAKE 1 TABLET(50 MCG) BY MOUTH DAILY BEFORE BREAKFAST   omeprazole 40 MG capsule Commonly known as:  PRILOSEC TAKE 1 CAPSULE(40 MG) BY MOUTH EVERY MORNING   oxyCODONE-acetaminophen 5-325 MG tablet Commonly known as:  PERCOCET Take 1-2 tablets by mouth every 4 (four) hours as needed for severe pain. What changed:  how much to take  when to take this   REXULTI 4 MG Tabs Generic drug:  Brexpiprazole Take 4 mg by mouth daily.   ROGAINE 2 % external solution Generic drug:  minoxidil Apply 1 application topically daily.   VITAMIN D3 PO Take 1 capsule by mouth daily.            Durable Medical Equipment        Start     Ordered   04/06/17 1224  DME Walker rolling  Once    Question:  Patient needs a walker to treat with the following condition  Answer:  Closed pseudoarthrosis of lumbar spine ([place])   04/06/17 1224   04/06/17 1224  DME 3 n 1  Once     04/06/17 1224       Signed: Jahid Weida A 04/07/2017, 12:40 PM

## 2017-04-07 NOTE — Progress Notes (Signed)
Patient doing well. Ambulate hallways last night and this morning with lumbar brace and standby assist. Still having pain in bilateral hips with ambulation. Pt assisted back to bed and is now resting comfortably. Will continue to monitor.

## 2017-04-08 ENCOUNTER — Encounter (HOSPITAL_COMMUNITY): Payer: Self-pay | Admitting: Neurosurgery

## 2017-04-08 MED FILL — Sodium Chloride IV Soln 0.9%: INTRAVENOUS | Qty: 1000 | Status: AC

## 2017-04-08 MED FILL — Heparin Sodium (Porcine) Inj 1000 Unit/ML: INTRAMUSCULAR | Qty: 30 | Status: AC

## 2017-04-17 ENCOUNTER — Ambulatory Visit: Payer: Medicare Other | Admitting: Obstetrics & Gynecology

## 2017-04-30 DIAGNOSIS — H35033 Hypertensive retinopathy, bilateral: Secondary | ICD-10-CM | POA: Diagnosis not present

## 2017-04-30 DIAGNOSIS — H25013 Cortical age-related cataract, bilateral: Secondary | ICD-10-CM | POA: Diagnosis not present

## 2017-04-30 DIAGNOSIS — H35371 Puckering of macula, right eye: Secondary | ICD-10-CM | POA: Diagnosis not present

## 2017-04-30 DIAGNOSIS — H2513 Age-related nuclear cataract, bilateral: Secondary | ICD-10-CM | POA: Diagnosis not present

## 2017-05-12 DIAGNOSIS — R300 Dysuria: Secondary | ICD-10-CM | POA: Diagnosis not present

## 2017-05-13 DIAGNOSIS — S32009K Unspecified fracture of unspecified lumbar vertebra, subsequent encounter for fracture with nonunion: Secondary | ICD-10-CM | POA: Diagnosis not present

## 2017-05-20 ENCOUNTER — Telehealth: Payer: Self-pay | Admitting: Gastroenterology

## 2017-05-20 NOTE — Telephone Encounter (Signed)
[name] [name], looks like you spoke to this pt about labs.

## 2017-05-21 NOTE — Telephone Encounter (Signed)
Left message to call.

## 2017-05-22 ENCOUNTER — Encounter: Payer: Self-pay | Admitting: Gastroenterology

## 2017-05-22 ENCOUNTER — Ambulatory Visit (INDEPENDENT_AMBULATORY_CARE_PROVIDER_SITE_OTHER): Payer: Medicare Other | Admitting: Gastroenterology

## 2017-05-22 ENCOUNTER — Other Ambulatory Visit (INDEPENDENT_AMBULATORY_CARE_PROVIDER_SITE_OTHER): Payer: Medicare Other

## 2017-05-22 VITALS — BP 102/62 | HR 100 | Ht 63.0 in | Wt 199.1 lb

## 2017-05-22 DIAGNOSIS — R7989 Other specified abnormal findings of blood chemistry: Secondary | ICD-10-CM | POA: Diagnosis not present

## 2017-05-22 DIAGNOSIS — R197 Diarrhea, unspecified: Secondary | ICD-10-CM

## 2017-05-22 DIAGNOSIS — R945 Abnormal results of liver function studies: Principal | ICD-10-CM

## 2017-05-22 LAB — FERRITIN: Ferritin: 18 ng/mL (ref 10.0–291.0)

## 2017-05-22 LAB — IBC PANEL
Iron: 27 ug/dL — ABNORMAL LOW (ref 42–145)
Saturation Ratios: 5.8 % — ABNORMAL LOW (ref 20.0–50.0)
TRANSFERRIN: 335 mg/dL (ref 212.0–360.0)

## 2017-05-22 NOTE — Patient Instructions (Addendum)
You will have labs checked today in the basement lab.  Please head down after you check out with the front desk  (AMA, ASMA, iron, tibc, ferritin, Hep B surface antibody.  Will plan on repeat LFTs in 6 months as well.  Please start taking one OTC imodium pill every morning shortly after waking, call in 3-4 weeks to report on your response.  Stay off fish oil.  Normal BMI (Body Mass Index- based on height and weight) is between 23 and 30. Your BMI today is Body mass index is 35.27 kg/m. Marland Kitchen Please consider follow up  regarding your BMI with your Primary Care Provider.

## 2017-05-22 NOTE — Telephone Encounter (Signed)
appt today at 1:30 pm

## 2017-05-22 NOTE — Progress Notes (Signed)
Review of pertinent gastrointestinal problems: 1. Chronic diarrhea: previous long term patient of Dr. Jim Desanctis he had perform colonoscopy for her in 2007 the found no polyps. The exam was normal. This was done for diarrhea and rectal bleeding. Biopsies showed no microscopic colitis.  Colonoscopy Dr. Ardis Hughs 10/2014: normal TI, normal colon, random biopsies normal.  2. GERD, intermittent dysphagia: EGD Dr. Ardis Hughs 10/2014: 3cm HH, cameron's erosions+, Schatzki's ring noted and dilated to 54mm. 3. Elevated liver tests: 2017 AST and ALT very mildly elevated.    Labs 2017 ANA neg, HB surface antigen neg, HCV ab negative.   [place] 06/2016:  Fatty liver, normal GB   HPI: This is a  pleasant 71 year old woman who is here with her husband today. She was last here in our office about a year ago  Leakage of stool, she has to wear a pad.  She usually has two BMs in the morning, then a third BM in the day at it is loose.  She has leakage most days.  Stopped fish oil 3 weeks ago and only leakage one time.  To be clear she has had much less stool leakage since stopping fish oil.  No abd pains.  She's gained a little weight.  Chief complaint is fatty liver disease, intermittent fecal incontinence  ROS: complete GI ROS as described in HPI, all other review negative.  Constitutional:  No unintentional weight loss  Weight is up 13 pounds in past year (same scale here in GI office)   Past Medical History:  Diagnosis Date  . Alopecia   . Anxiety   . Arthritis    "spine" (07/28/2013)  . Breast cancer ([place]) 1997; 2014   right  . CAP (community acquired pneumonia)    admission 06-04-2013 secondary to failed oral antibitotics---  last cxr 06-16-2013  much improved  . Chronic back pain    "lower back and upper neck" (07/28/2013)  . 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 DDD (degenerative disc disease)   . Deafness in left ear   . 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 ear   . Depression   . Diverticulosis    . Ecchymosis    FROM IV AND LAB WORK THE PAST WEEK  06-17-2013  . Ejection fraction   . Elevated LFTs   . Fibromyalgia 1978  . GERD (gastroesophageal reflux disease)   . Hematuria   . History of kidney stones   . History of syncope    episode 2008--  no recurrence since  . HLD (hyperlipidemia)   . Hypothyroidism   . Kidney stones 2014  . Leukocytosis   . Orthostatic hypotension    slight in the past  . Plantar fasciitis   . Ruptured disk    one in neck and two in back  . S/P chemotherapy, time since greater than 12 weeks   . Sensation of pressure in bladder area   . Sinus tachycardia    mild resting  . Skin cancer   . SOB (shortness of breath) 06-17-2013 CURRENTLY RESIDUAL FROM RECENT CAP   new since sore throats- 12/2014    . Stool incontinence    "at times recently"   . Ureteral calculi    bilateral  . Urgency of urination   . Wears glasses     Past Surgical History:  Procedure Laterality Date  . ABDOMINAL HYSTERECTOMY    . ANTERIOR LATERAL LUMBAR FUSION 4 LEVELS Left 04/25/2016   Procedure: LEFT LUMBAR ONE-TWO, LUMBAR TWO-THREE, LUMBAR THREE-FOUR, LUMBAR FOUR-FIVE  ANTERIOR LATERAL LUMBAR FUSION;  Surgeon: Earnie Larsson, MD;  Location: [place] [facility];  Service: Neurosurgery;  Laterality: Left;  . APPENDECTOMY  1997  . APPLICATION OF ROBOTIC ASSISTANCE FOR SPINAL PROCEDURE N/A 04/06/2017   Procedure: APPLICATION OF ROBOTIC ASSISTANCE FOR SPINAL PROCEDURE;  Surgeon: Earnie Larsson, MD;  Location: [place];  Service: Neurosurgery;  Laterality: N/A;  . BREAST BIOPSY Right 2014  . BREAST LUMPECTOMY WITH AXILLARY LYMPH NODE DISSECTION Right 1997  . CYSTOSCOPY WITH RETROGRADE PYELOGRAM, URETEROSCOPY AND STENT PLACEMENT Bilateral 06/17/2013   Procedure: CYSTOSCOPY WITH RETROGRADE PYELOGRAM, URETEROSCOPY AND LEFT DOUBLE  J STENT PLACEMENT RIGHT URETERAL HOLMIIUM LASER AND DOUBLE J STENT ;  Surgeon: Hanley Ben, MD;  Location: [place];  Service: Urology;  Laterality:  Bilateral;  . CYSTOSCOPY WITH URETEROSCOPY, STONE BASKETRY AND STENT PLACEMENT Bilateral 2014  . HOLMIUM LASER APPLICATION Bilateral 31/51/7616   Procedure: HOLMIUM LASER APPLICATION;  Surgeon: Hanley Ben, MD;  Location: [place];  Service: Urology;  Laterality: Bilateral;  . LITHOTRIPSY Left ~ 06/2013  . LUMBAR EPIDURAL INJECTION     has had 7 injections  . MASTECTOMY COMPLETE / SIMPLE Right 07/28/2013  . MASTECTOMY W/ NODES PARTIAL Right 1997  . PORT-A-CATH REMOVAL Left 10/03/2014   Procedure: REMOVAL PORT-A-CATH;  Surgeon: Donnie Mesa, MD;  Location: [place];  Service: General;  Laterality: Left;  . PORTACATH PLACEMENT Left 07/28/2013   Procedure: ATTEMPTED INSERTION PORT-A-CATH;  Surgeon: Imogene Burn. Georgette Dover, MD;  Location: [place];  Service: General;  Laterality: Left;  . POSTERIOR LUMBAR FUSION 4 LEVEL N/A 04/25/2016   Procedure: LUMBAR FIVE-SACRAL ONE POSTERIOR LUMBAR INTERBODY FUSION, THORACIC NINE-SACRAL ONE POSTERIOR LATERAL ARTHRODESIS WITH PEDICLE SCREWS;  Surgeon: Earnie Larsson, MD;  Location: [place] [facility]S;  Service: Neurosurgery;  Laterality: N/A;  . RETINAL DETACHMENT SURGERY Right 2013  . ROBOTIC ASSITED PARTIAL NEPHRECTOMY Right 02/05/2015   Procedure: ROBOTIC ASSITED PARTIAL NEPHRECTOMY;  Surgeon: Raynelle Bring, MD;  Location: [facility];  Service: Urology;  Laterality: Right;  . SIMPLE MASTECTOMY WITH AXILLARY SENTINEL NODE BIOPSY Right 07/28/2013   Procedure: RIGHT TOTAL  MASTECTOMY;  Surgeon: Imogene Burn. Georgette Dover, MD;  Location: [place];  Service: General;  Laterality: Right;  . TONSILLECTOMY  AGE 14  . TOTAL ABDOMINAL HYSTERECTOMY W/ BILATERAL SALPINGOOPHORECTOMY  1997  . TRANSTHORACIC ECHOCARDIOGRAM  05-11-2007  dr Ron Parker   normal lvf/  ef 65%    Current Outpatient Prescriptions  Medication Sig Dispense Refill  . ALPRAZolam (XANAX) 0.5 MG tablet Take 0.5 mg by mouth 2 (two) times daily.     Marland Kitchen amitriptyline (ELAVIL) 150 MG tablet Take 150 mg by mouth  at bedtime.    Marland Kitchen anastrozole (ARIMIDEX) 1 MG tablet TAKE 1 TABLET(1 MG) BY MOUTH DAILY 90 tablet 2  . diazepam (VALIUM) 5 MG tablet Take 1-2 tablets (5-10 mg total) by mouth every 6 (six) hours as needed for muscle spasms. 30 tablet 0  . DULoxetine (CYMBALTA) 30 MG capsule Take 30 mg by mouth daily.    Marland Kitchen gabapentin (NEURONTIN) 300 MG capsule Take 300mg s by mouth daily  4  . levothyroxine (SYNTHROID, LEVOTHROID) 50 MCG tablet TAKE 1 TABLET(50 MCG) BY MOUTH DAILY BEFORE BREAKFAST 90 tablet 0  . minoxidil (ROGAINE) 2 % external solution Apply 1 application topically daily.     Marland Kitchen omeprazole (PRILOSEC) 40 MG capsule TAKE 1 CAPSULE(40 MG) BY MOUTH EVERY MORNING 90 capsule 0  . oxyCODONE-acetaminophen (PERCOCET) 5-325 MG tablet Take 1-2 tablets by mouth every 4 (  four) hours as needed for severe pain. 60 tablet 0  . REXULTI 4 MG TABS Take 4 mg by mouth daily.  0   No current facility-administered medications for this visit.     Allergies as of 05/22/2017 - Review Complete 05/22/2017  Allergen Reaction Noted  . Lasix [furosemide] Nausea And Vomiting and Other (See Comments) 03/01/2013  . Lithium Other (See Comments) 03/03/2012  . Sulfa antibiotics Hives 03/01/2013  . Trazodone and nefazodone Other (See Comments) 03/03/2012  . Lyrica [pregabalin] Diarrhea, Nausea Only, and Rash     Family History  Problem Relation Age of Onset  . Heart disease Maternal Grandfather   . Diabetes Maternal Grandfather   . Colon cancer Maternal Aunt 79  . Brain cancer Paternal Grandmother        dx in 13s  . Dementia Mother   . Diabetes Mother   . Osteoporosis Mother   . Diabetes Maternal Aunt   . Prostate cancer Father 43  . Bipolar disorder Maternal Aunt   . Stroke Maternal Aunt   . Stomach cancer Paternal Uncle        dx in late 86s    Social History   Social History  . Marital status: Married    Spouse name: Arnette Norris  . Number of children: 1  . Years of education: N/A   Occupational History  .  homemaker    Social History Main Topics  . Smoking status: Never Smoker  . Smokeless tobacco: Never Used  . Alcohol use No     Comment: 07/28/2013 "no alcohol since 1994; never had problem w/it"  . Drug use: No  . Sexual activity: No     Comment: TAH/BSO   Other Topics Concern  . Not on file   Social History Narrative  . No narrative on file     Physical Exam: BP 102/62 (BP Location: Left Arm, Patient Position: Sitting, Cuff Size: Normal)   Pulse 100   Ht 5\' 3"  (1.6 m) Comment: height measured without shoes  Wt 199 lb 2 oz (90.3 kg)   LMP 08/26/1995   BMI 35.27 kg/m  Constitutional: generally well-appearing Psychiatric: alert and oriented x3 Abdomen: soft, nontender, nondistended, no obvious ascites, no peritoneal signs, normal bowel sounds No peripheral edema noted in lower extremities  Assessment and plan: 71 y.o. female with fatty liver, intermittent leakage of stool  She has generally loose stools for 10-15 years. She has had colonoscopies twice over that that past time showing completely normal colon with normal random biopsies. She does not have microscopic colitis. I think her symptoms are more functional area I recommended a trial of once daily over-the-counter Imodium. She stopped fish oil recently and noticed a definite improvement in her fecal incontinence and so that was probably contributing as well. She has mild fatty liver disease with very slightly elevated transaminases. She needs some other liver tests to rule out other chronic liver diseases, see that list of labs below. I will likely follow her liver tests about every 6 months to make sure there are no significant changes.  Please see the "Patient Instructions" section for addition details about the plan.  Owens Loffler, MD [facility] 05/22/2017, 1:19 PM

## 2017-05-27 LAB — MITOCHONDRIAL ANTIBODIES: Mitochondrial M2 Ab, IgG: <=20 U

## 2017-05-27 LAB — HEPATITIS B SURFACE ANTIBODY,QUALITATIVE: HEP B S AB: NONREACTIVE

## 2017-05-27 LAB — ANTI-SMOOTH MUSCLE ANTIBODY, IGG: Actin (Smooth Muscle) Antibody (IGG): <20 U (ref <20)

## 2017-06-16 DIAGNOSIS — M48061 Spinal stenosis, lumbar region without neurogenic claudication: Secondary | ICD-10-CM | POA: Diagnosis not present

## 2017-06-16 DIAGNOSIS — S32009K Unspecified fracture of unspecified lumbar vertebra, subsequent encounter for fracture with nonunion: Secondary | ICD-10-CM | POA: Diagnosis not present

## 2017-06-16 DIAGNOSIS — Z6834 Body mass index (BMI) 34.0-34.9, adult: Secondary | ICD-10-CM | POA: Diagnosis not present

## 2017-06-19 ENCOUNTER — Other Ambulatory Visit: Payer: Self-pay | Admitting: Oncology

## 2017-06-19 DIAGNOSIS — C9110 Chronic lymphocytic leukemia of B-cell type not having achieved remission: Secondary | ICD-10-CM

## 2017-06-19 DIAGNOSIS — C50011 Malignant neoplasm of nipple and areola, right female breast: Secondary | ICD-10-CM

## 2017-06-25 DIAGNOSIS — Z23 Encounter for immunization: Secondary | ICD-10-CM | POA: Diagnosis not present

## 2017-06-25 DIAGNOSIS — Z7982 Long term (current) use of aspirin: Secondary | ICD-10-CM | POA: Diagnosis not present

## 2017-06-25 DIAGNOSIS — Z0000 Encounter for general adult medical examination without abnormal findings: Secondary | ICD-10-CM | POA: Diagnosis not present

## 2017-06-25 DIAGNOSIS — Z1321 Encounter for screening for nutritional disorder: Secondary | ICD-10-CM | POA: Diagnosis not present

## 2017-06-25 DIAGNOSIS — R7989 Other specified abnormal findings of blood chemistry: Secondary | ICD-10-CM | POA: Diagnosis not present

## 2017-06-25 DIAGNOSIS — E039 Hypothyroidism, unspecified: Secondary | ICD-10-CM | POA: Diagnosis not present

## 2017-06-30 DIAGNOSIS — F339 Major depressive disorder, recurrent, unspecified: Secondary | ICD-10-CM | POA: Diagnosis not present

## 2017-06-30 DIAGNOSIS — Z87898 Personal history of other specified conditions: Secondary | ICD-10-CM | POA: Diagnosis not present

## 2017-06-30 DIAGNOSIS — E875 Hyperkalemia: Secondary | ICD-10-CM | POA: Diagnosis not present

## 2017-06-30 DIAGNOSIS — C9110 Chronic lymphocytic leukemia of B-cell type not having achieved remission: Secondary | ICD-10-CM | POA: Diagnosis not present

## 2017-06-30 DIAGNOSIS — E039 Hypothyroidism, unspecified: Secondary | ICD-10-CM | POA: Diagnosis not present

## 2017-07-06 ENCOUNTER — Ambulatory Visit: Payer: Medicare Other | Admitting: Obstetrics & Gynecology

## 2017-07-07 ENCOUNTER — Telehealth: Payer: Self-pay | Admitting: Gastroenterology

## 2017-07-08 NOTE — Telephone Encounter (Signed)
FYI Dr Jacobs  

## 2017-07-09 NOTE — Progress Notes (Signed)
71 y.o. G1P1 MarriedCaucasianF here for annual exam.  Has CLL.  Followed by Dr. Jana Hakim.  Saw Dr. Ardis Hughs in late September.  Reports she is eating more ice and, in fact, craving it.  Had iron studies with iron level low and ferritin in low normal range.    Denies vaginal bleeding.  Still having some hot flashes but this is improved significantly for some reason this past year.   Had back surgery in 9/17 and 8/18.  Still having a lot of back pain.  PCP:  Dr. Shelia Media.  Has done blood work with him as well.  Calcium and potassium were elevated but repeated and this was normal.    Patient's last menstrual period was 08/26/1995.          Sexually active: No.  The current method of family planning is status post hysterectomy.    Exercising: No.  The patient does not participate in regular exercise at present. Smoker:  no  Health Maintenance: Pap:  10/09/15 Neg  History of abnormal Pap:  no MMG:  02/04/17 BIRADS2:Benign. Right Mastectomy  Colonoscopy:  11/03/14 f/u 10 years  BMD:   6/17 Normal  TDaP:  2017  Pneumonia vaccine(s):  Done Zostavax: Done  Hep C testing: Unsure  Screening Labs: PCP   reports that  has never smoked. she has never used smokeless tobacco. She reports that she does not drink alcohol or use drugs.  Past Medical History:  Diagnosis Date  . Alopecia   . Anxiety   . Arthritis    "spine" (07/28/2013)  . Breast cancer ([place]) 1997; 2014   right  . CAP (community acquired pneumonia)    admission 06-04-2013, failed outpatient  . Chronic back pain    "lower back and upper neck" (07/28/2013)  . CLL (chronic lymphocytic leukemia) ([place]) 05/2013  . DDD (degenerative disc disease)   . 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 ear   . Depression   . Diverticulosis   . Elevated LFTs   . Fibromyalgia 1978  . GERD (gastroesophageal reflux disease)   . Hematuria   . History of syncope    episode 2008--  no recurrence since  . HLD (hyperlipidemia)   . Hypothyroidism   . Kidney stones 2014  .  Plantar fasciitis   . Ruptured disk    one in neck and two in back  . S/P chemotherapy, time since greater than 12 weeks   . Sinus tachycardia    mild resting  . Skin cancer   . Stool incontinence    "at times recently"   . Urgency of urination   . Wears glasses     Past Surgical History:  Procedure Laterality Date  . ABDOMINAL HYSTERECTOMY    . APPENDECTOMY  1997  . APPLICATION OF ROBOTIC ASSISTANCE FOR SPINAL PROCEDURE N/A 04/06/2017   Performed by Earnie Larsson, MD at [facility] Left 07/28/2013   Performed by Donnie Mesa, MD at [facility] OR  . BREAST BIOPSY Right 2014  . BREAST LUMPECTOMY WITH AXILLARY LYMPH NODE DISSECTION Right 1997  . CYSTOSCOPY WITH RETROGRADE PYELOGRAM, URETEROSCOPY AND LEFT DOUBLE  J STENT PLACEMENT RIGHT URETERAL HOLMIIUM LASER AND DOUBLE J STENT Bilateral 06/17/2013   Performed by Beulah Gandy, MD at [facility]  . CYSTOSCOPY WITH URETEROSCOPY, STONE BASKETRY AND STENT PLACEMENT Bilateral 2014  . HOLMIUM LASER APPLICATION Bilateral 30/16/0109   Performed by Beulah Gandy, MD at [facility]  . LEFT LUMBAR ONE-TWO, LUMBAR TWO-THREE, LUMBAR  THREE-FOUR, LUMBAR FOUR-FIVE ANTERIOR LATERAL LUMBAR FUSION Left 04/25/2016   Performed by Earnie Larsson, MD at [facility] NEURO ORS  . LITHOTRIPSY Left ~ 06/2013  . LUMBAR EPIDURAL INJECTION     has had 7 injections  . LUMBAR FIVE-SACRAL ONE POSTERIOR LUMBAR INTERBODY FUSION, THORACIC NINE-SACRAL ONE POSTERIOR LATERAL ARTHRODESIS WITH PEDICLE SCREWS N/A 04/25/2016   Performed by Earnie Larsson, MD at [facility] NEURO ORS  . Lumbar Fusion Revision - Lumbar five-Sacral one with robot for guidance N/A 04/06/2017   Performed by Earnie Larsson, MD at [place]  . MASTECTOMY COMPLETE / SIMPLE Right 07/28/2013  . MASTECTOMY W/ NODES PARTIAL Right 1997  . REMOVAL PORT-A-CATH Left 10/03/2014   Performed by Donnie Mesa, MD at [facility]  . RETINAL DETACHMENT SURGERY Right 2013  . RIGHT TOTAL   MASTECTOMY Right 07/28/2013   Performed by Donnie Mesa, MD at [place] Right 02/05/2015   Performed by Raynelle Bring, MD at [facility] ORS  . TONSILLECTOMY  AGE 67  . TOTAL ABDOMINAL HYSTERECTOMY W/ BILATERAL SALPINGOOPHORECTOMY  1997  . TRANSTHORACIC ECHOCARDIOGRAM  05-11-2007  dr Ron Parker   normal lvf/  ef 65%    Current Outpatient Medications  Medication Sig Dispense Refill  . ALPRAZolam (XANAX) 0.5 MG tablet Take 0.5 mg by mouth 2 (two) times daily.     Marland Kitchen amitriptyline (ELAVIL) 150 MG tablet Take 150 mg by mouth at bedtime.    Marland Kitchen anastrozole (ARIMIDEX) 1 MG tablet TAKE 1 TABLET(1 MG) BY MOUTH DAILY 90 tablet 2  . DULoxetine (CYMBALTA) 30 MG capsule Take 30 mg by mouth daily.    Marland Kitchen gabapentin (NEURONTIN) 300 MG capsule Take 300mg s by mouth daily  4  . levothyroxine (SYNTHROID, LEVOTHROID) 88 MCG tablet Take 1 tablet daily by mouth.  4  . minoxidil (ROGAINE) 2 % external solution Apply 1 application topically daily.     Marland Kitchen omeprazole (PRILOSEC) 40 MG capsule TAKE 1 CAPSULE(40 MG) BY MOUTH EVERY MORNING 90 capsule 0  . oxyCODONE-acetaminophen (PERCOCET) 5-325 MG tablet Take 1-2 tablets by mouth every 4 (four) hours as needed for severe pain. 60 tablet 0  . REXULTI 2 MG TABS Take 1 tablet daily by mouth.  1   No current facility-administered medications for this visit.     Family History  Problem Relation Age of Onset  . Heart disease Maternal Grandfather   . Diabetes Maternal Grandfather   . Colon cancer Maternal Aunt 79  . Brain cancer Paternal Grandmother        dx in 79s  . Dementia Mother   . Diabetes Mother   . Osteoporosis Mother   . Diabetes Maternal Aunt   . Prostate cancer Father 70  . Bipolar disorder Maternal Aunt   . Stroke Maternal Aunt   . Stomach cancer Paternal Uncle        dx in late 55s    ROS:  Pertinent items are noted in HPI.  Otherwise, a comprehensive ROS was negative.  Exam:   BP 116/70 (BP Location: Right Arm, Patient  Position: Sitting, Cuff Size: Large)   Pulse 90   Resp 14   Ht 5\' 4"  (1.626 m)   Wt 190 lb (86.2 kg)   LMP 08/26/1995   BMI 32.61 kg/m      Height: 5\' 4"  (162.6 cm)  Ht Readings from Last 3 Encounters:  07/10/17 5\' 4"  (1.626 m)  05/22/17 5\' 3"  (1.6 m)  04/06/17 5\' 4"  (1.626 m)  General appearance: alert, cooperative and appears stated age Head: Normocephalic, without obvious abnormality, atraumatic Neck: no adenopathy, supple, symmetrical, trachea midline and thyroid normal to inspection and palpation Lungs: clear to auscultation bilaterally Breasts: normal appearance, no masses or tenderness Heart: regular rate and rhythm Abdomen: soft, non-tender; bowel sounds normal; no masses,  no organomegaly Extremities: extremities normal, atraumatic, no cyanosis or edema Skin: Skin color, texture, turgor normal. No rashes or lesions Lymph nodes: Cervical, supraclavicular, and axillary nodes normal. No abnormal inguinal nodes palpated Neurologic: Grossly normal   Pelvic: External genitalia:  no lesions              Urethra:  normal appearing urethra with no masses, tenderness or lesions              Bartholins and Skenes: normal                 Vagina: normal appearing vagina with normal color and discharge, no lesions              Cervix: absent              Pap taken: No. Bimanual Exam:  Uterus:  uterus absent              Adnexa: no masses               Rectovaginal: Confirms               Anus:  normal sphincter tone, no lesions  Chaperone was present for exam.  A:  Well Woman with normal exam PMP, no HRT H/o TAH/BSO Breast cancer hx 1997, s/p lumpectomy, radiation and 5 years of Tamoxifen.  Recurrence breast cancer 2013 s/p simple mastectomy, chemo, on Armidex H/O CLL, s/p partial nephrectomy H/O fatty liver disease 2010 Hearing loss, using hearing aids Hypothyroidism  P:   Mammogram yearly pap smear not indicated Lab work done with oncology and PCP Return annually  or prn

## 2017-07-10 ENCOUNTER — Other Ambulatory Visit: Payer: Self-pay

## 2017-07-10 ENCOUNTER — Ambulatory Visit: Payer: Self-pay | Admitting: Obstetrics & Gynecology

## 2017-07-10 ENCOUNTER — Encounter: Payer: Self-pay | Admitting: Obstetrics & Gynecology

## 2017-07-10 ENCOUNTER — Ambulatory Visit (INDEPENDENT_AMBULATORY_CARE_PROVIDER_SITE_OTHER): Payer: Medicare Other | Admitting: Obstetrics & Gynecology

## 2017-07-10 VITALS — BP 116/70 | HR 90 | Resp 14 | Ht 64.0 in | Wt 190.0 lb

## 2017-07-10 DIAGNOSIS — Z01419 Encounter for gynecological examination (general) (routine) without abnormal findings: Secondary | ICD-10-CM

## 2017-07-10 NOTE — Patient Instructions (Addendum)
Ferrous sulfate 325mg  three times weekly.  Have you iron levels check again 3 months.

## 2017-07-29 DIAGNOSIS — M48061 Spinal stenosis, lumbar region without neurogenic claudication: Secondary | ICD-10-CM | POA: Diagnosis not present

## 2017-07-29 DIAGNOSIS — Z6832 Body mass index (BMI) 32.0-32.9, adult: Secondary | ICD-10-CM | POA: Diagnosis not present

## 2017-07-29 DIAGNOSIS — I10 Essential (primary) hypertension: Secondary | ICD-10-CM | POA: Diagnosis not present

## 2017-07-30 ENCOUNTER — Other Ambulatory Visit: Payer: Self-pay | Admitting: Oncology

## 2017-07-30 DIAGNOSIS — C9110 Chronic lymphocytic leukemia of B-cell type not having achieved remission: Secondary | ICD-10-CM

## 2017-07-30 DIAGNOSIS — C50011 Malignant neoplasm of nipple and areola, right female breast: Secondary | ICD-10-CM

## 2017-08-25 HISTORY — PX: CATARACT EXTRACTION, BILATERAL: SHX1313

## 2017-09-09 DIAGNOSIS — E039 Hypothyroidism, unspecified: Secondary | ICD-10-CM | POA: Diagnosis not present

## 2017-09-09 DIAGNOSIS — E875 Hyperkalemia: Secondary | ICD-10-CM | POA: Diagnosis not present

## 2017-09-09 DIAGNOSIS — E782 Mixed hyperlipidemia: Secondary | ICD-10-CM | POA: Diagnosis not present

## 2017-09-16 DIAGNOSIS — E039 Hypothyroidism, unspecified: Secondary | ICD-10-CM | POA: Diagnosis not present

## 2017-09-16 DIAGNOSIS — C9190 Lymphoid leukemia, unspecified not having achieved remission: Secondary | ICD-10-CM | POA: Diagnosis not present

## 2017-09-18 ENCOUNTER — Other Ambulatory Visit: Payer: Self-pay | Admitting: Oncology

## 2017-09-18 DIAGNOSIS — C50011 Malignant neoplasm of nipple and areola, right female breast: Secondary | ICD-10-CM

## 2017-09-18 DIAGNOSIS — C9110 Chronic lymphocytic leukemia of B-cell type not having achieved remission: Secondary | ICD-10-CM

## 2017-09-29 ENCOUNTER — Inpatient Hospital Stay: Payer: Medicare Other | Attending: Oncology

## 2017-09-29 ENCOUNTER — Other Ambulatory Visit: Payer: Self-pay | Admitting: Oncology

## 2017-09-29 DIAGNOSIS — C9110 Chronic lymphocytic leukemia of B-cell type not having achieved remission: Secondary | ICD-10-CM | POA: Diagnosis not present

## 2017-09-29 DIAGNOSIS — Z170 Estrogen receptor positive status [ER+]: Secondary | ICD-10-CM | POA: Insufficient documentation

## 2017-09-29 DIAGNOSIS — D509 Iron deficiency anemia, unspecified: Secondary | ICD-10-CM | POA: Diagnosis not present

## 2017-09-29 DIAGNOSIS — C50511 Malignant neoplasm of lower-outer quadrant of right female breast: Secondary | ICD-10-CM | POA: Diagnosis present

## 2017-09-29 DIAGNOSIS — Z9011 Acquired absence of right breast and nipple: Secondary | ICD-10-CM | POA: Insufficient documentation

## 2017-09-29 LAB — COMPREHENSIVE METABOLIC PANEL
ALT: 10 U/L (ref 0–55)
AST: 18 U/L (ref 5–34)
Albumin: 3.9 g/dL (ref 3.5–5.0)
Alkaline Phosphatase: 105 U/L (ref 40–150)
Anion gap: 9 (ref 3–11)
BUN: 19 mg/dL (ref 7–26)
CHLORIDE: 110 mmol/L — AB (ref 98–109)
CO2: 22 mmol/L (ref 22–29)
CREATININE: 1.25 mg/dL — AB (ref 0.60–1.10)
Calcium: 9.2 mg/dL (ref 8.4–10.4)
GFR calc non Af Amer: 42 mL/min — ABNORMAL LOW (ref >60)
GFR, EST AFRICAN AMERICAN: 49 mL/min — AB (ref >60)
Glucose, Bld: 159 mg/dL — ABNORMAL HIGH (ref 70–140)
POTASSIUM: 4.6 mmol/L (ref 3.5–5.1)
SODIUM: 141 mmol/L (ref 136–145)
Total Bilirubin: <0.2 mg/dL — ABNORMAL LOW (ref 0.2–1.2)
Total Protein: 6.6 g/dL (ref 6.4–8.3)

## 2017-09-29 LAB — CBC WITH DIFFERENTIAL/PLATELET
BASOS ABS: 0.2 10*3/uL — AB (ref 0.0–0.1)
Basophils Relative: 1 %
Eosinophils Absolute: 1 10*3/uL — ABNORMAL HIGH (ref 0.0–0.5)
Eosinophils Relative: 2 %
HEMATOCRIT: 32.9 % — AB (ref 34.8–46.6)
Hemoglobin: 9.7 g/dL — ABNORMAL LOW (ref 11.6–15.9)
LYMPHS ABS: 36 10*3/uL — AB (ref 0.9–3.3)
LYMPHS PCT: 84 %
MCH: 22.7 pg — AB (ref 25.1–34.0)
MCHC: 29.6 g/dL — ABNORMAL LOW (ref 31.5–36.0)
MCV: 76.8 fL — AB (ref 79.5–101.0)
Monocytes Absolute: 0.8 10*3/uL (ref 0.1–0.9)
Monocytes Relative: 2 %
NEUTROS ABS: 4.9 10*3/uL (ref 1.5–6.5)
Neutrophils Relative %: 11 %
PLATELETS: 253 10*3/uL (ref 145–400)
RBC: 4.29 MIL/uL (ref 3.70–5.45)
RDW: 18.8 % — ABNORMAL HIGH (ref 11.2–14.5)
WBC: 42.8 10*3/uL — ABNORMAL HIGH (ref 3.9–10.3)

## 2017-09-29 LAB — LACTATE DEHYDROGENASE: LDH: 512 U/L — AB (ref 125–245)

## 2017-09-29 NOTE — Progress Notes (Unsigned)
[name] [name] had low iron studies noted on her November visit with Dr. Sabra Heck.  She was started on oral iron supplementation.

## 2017-09-30 LAB — BETA 2 MICROGLOBULIN, SERUM: Beta-2 Microglobulin: 4 mg/L — ABNORMAL HIGH (ref 0.6–2.4)

## 2017-09-30 NOTE — Progress Notes (Signed)
ID: [name] [name] OB: 20-Nov-1945  MR#: [id number]  NTZ#:[id number]  PCP: Deland Pretty, MD GYN:  M. 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 Annabell Sabal performed a right lumpectomy and axillary lymph node dissection for what according to the patient and her family was a stage I invasive ductal breast cancer. She received radiation adjuvantly and then took tamoxifen for 5 years  In February of 2014 she had a normal screening mammography, but in November 2014 she felt a "hard lump" in her right breast. She brought this to the attention of her urologist, Dr. Izora Gala, and he set her up for bilateral diagnostic mammography with mammography at [facility] 07/13/2013. This showed her breast density to be category B. There was no mammographic abnormality noted but ultrasonography showed a 1.4 cm irregularly shaped solid mass in the right breast at the 8:00 position. This was biopsied the same day, and showed ([id number]) an invasive ductal carcinoma, grade 2, estrogen receptor 100% positive, progesterone receptor 13% positive, with an MIB-1 of 33% and HER-2 amplification with a HER-2: CEP 17 ratio of 3.19, and a HER-2 copy number percent all of 4.15.  The patient's subsequent history is as detailed below  INTERVAL HISTORY: [name] [name] returns today for follow-up of her estrogen receptor positive breast cancer accompanied by her husband. She continues on anastrozole, with good tolerance. She notes that her hot flashes have improved. She notes that she is able to cool off and they are not as hot. She denies issues with vaginal dryness.   REVIEW OF SYSTEMS: [name] [name] reports  that she had her second back surgery under Dr. Trenton Gammon. She notes that she is still in pain. She notes that she is still healing, and she denies having any bleeding issues. She notes that she hasn't taken her iron supplements because she forgot that it was prescribed to her. She notes that she also saw Dr. Loney Loh who was concerned about her thyroid levels. She notes that she is seeing Dr. Ardis Hughs because she was having trouble with diarrhea. She notes that the diarrhea has now subsided completely. She notes that she isn't able to exercise because her back and hips hurt. She denies unusual headaches, visual changes, nausea, vomiting, or dizziness. There has been no unusual cough, phlegm production, or pleurisy. This been no change in bowel or bladder habits. She denies unexplained fatigue or unexplained weight loss, bleeding, rash, or fever. A detailed review of systems was otherwise stable.    PAST MEDICAL HISTORY: Past Medical History:  Diagnosis Date  . Alopecia   . Anxiety   . Arthritis    "spine" (07/28/2013)  . Breast cancer ([place]) 1997; 2014   right  . CAP (community acquired pneumonia)    admission 06-04-2013, failed outpatient  . Chronic back pain    "lower back and upper neck" (07/28/2013)  . CLL (chronic lymphocytic leukemia) ([place]) 05/2013  . DDD (degenerative disc disease)   . 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 ear   . Depression   . Diverticulosis   . Elevated LFTs   . Fibromyalgia 1978  . GERD (gastroesophageal reflux disease)   . Hematuria   . History of syncope  episode 2008--  no recurrence since  . HLD (hyperlipidemia)   . Hypothyroidism   . Kidney stones 2014  . Plantar fasciitis   . Ruptured disk    one in neck and two in back  . S/P chemotherapy, time since greater than 12 weeks   . Sinus tachycardia    mild resting  . Skin cancer   . Stool incontinence    "at times recently"   . Urgency of urination   . Wears glasses     PAST SURGICAL HISTORY: Past Surgical History:   Procedure Laterality Date  . ABDOMINAL HYSTERECTOMY    . ANTERIOR LATERAL LUMBAR FUSION 4 LEVELS Left 04/25/2016   Procedure: LEFT LUMBAR ONE-TWO, LUMBAR TWO-THREE, LUMBAR THREE-FOUR, LUMBAR FOUR-FIVE ANTERIOR LATERAL LUMBAR FUSION;  Surgeon: Earnie Larsson, MD;  Location: [place] [facility];  Service: Neurosurgery;  Laterality: Left;  . APPENDECTOMY  1997  . APPLICATION OF ROBOTIC ASSISTANCE FOR SPINAL PROCEDURE N/A 04/06/2017   Procedure: APPLICATION OF ROBOTIC ASSISTANCE FOR SPINAL PROCEDURE;  Surgeon: Earnie Larsson, MD;  Location: [facility];  Service: Neurosurgery;  Laterality: N/A;  . BREAST BIOPSY Right 2014  . BREAST LUMPECTOMY WITH AXILLARY LYMPH NODE DISSECTION Right 1997  . CYSTOSCOPY WITH RETROGRADE PYELOGRAM, URETEROSCOPY AND STENT PLACEMENT Bilateral 06/17/2013   Procedure: CYSTOSCOPY WITH RETROGRADE PYELOGRAM, URETEROSCOPY AND LEFT DOUBLE  J STENT PLACEMENT RIGHT URETERAL HOLMIIUM LASER AND DOUBLE J STENT ;  Surgeon: Hanley Ben, MD;  Location: [facility] [place];  Service: Urology;  Laterality: Bilateral;  . CYSTOSCOPY WITH URETEROSCOPY, STONE BASKETRY AND STENT PLACEMENT Bilateral 2014  . HOLMIUM LASER APPLICATION Bilateral 43/32/9518   Procedure: HOLMIUM LASER APPLICATION;  Surgeon: Hanley Ben, MD;  Location: [place];  Service: Urology;  Laterality: Bilateral;  . LITHOTRIPSY Left ~ 06/2013  . LUMBAR EPIDURAL INJECTION     has had 7 injections  . MASTECTOMY COMPLETE / SIMPLE Right 07/28/2013  . MASTECTOMY W/ NODES PARTIAL Right 1997  . PORT-A-CATH REMOVAL Left 10/03/2014   Procedure: REMOVAL PORT-A-CATH;  Surgeon: Donnie Mesa, MD;  Location: [place];  Service: General;  Laterality: Left;  . PORTACATH PLACEMENT Left 07/28/2013   Procedure: ATTEMPTED INSERTION PORT-A-CATH;  Surgeon: Imogene Burn. Georgette Dover, MD;  Location: [place];  Service: General;  Laterality: Left;  . POSTERIOR LUMBAR FUSION 4 LEVEL N/A 04/25/2016   Procedure: LUMBAR FIVE-SACRAL ONE  POSTERIOR LUMBAR INTERBODY FUSION, THORACIC NINE-SACRAL ONE POSTERIOR LATERAL ARTHRODESIS WITH PEDICLE SCREWS;  Surgeon: Earnie Larsson, MD;  Location: [place] [facility];  Service: Neurosurgery;  Laterality: N/A;  . RETINAL DETACHMENT SURGERY Right 2013  . ROBOTIC ASSITED PARTIAL NEPHRECTOMY Right 02/05/2015   Procedure: ROBOTIC ASSITED PARTIAL NEPHRECTOMY;  Surgeon: Raynelle Bring, MD;  Location: [facility];  Service: Urology;  Laterality: Right;  . SIMPLE MASTECTOMY WITH AXILLARY SENTINEL NODE BIOPSY Right 07/28/2013   Procedure: RIGHT TOTAL  MASTECTOMY;  Surgeon: Imogene Burn. Georgette Dover, MD;  Location: [facility];  Service: General;  Laterality: Right;  . TONSILLECTOMY  AGE 72  . TOTAL ABDOMINAL HYSTERECTOMY W/ BILATERAL SALPINGOOPHORECTOMY  1997  . TRANSTHORACIC ECHOCARDIOGRAM  05-11-2007  dr Ron Parker   normal lvf/  ef 65%    FAMILY HISTORY Family History  Problem Relation Age of Onset  . Heart disease Maternal Grandfather   . Diabetes Maternal Grandfather   . Colon cancer Maternal Aunt 79  . Brain cancer Paternal Grandmother        dx in 80s  . Dementia Mother   . Diabetes Mother   .  Osteoporosis Mother   . Diabetes Maternal Aunt   . Prostate cancer Father 14  . Bipolar disorder Maternal Aunt   . Stroke Maternal Aunt   . Stomach cancer Paternal Uncle        dx in late 31s   the patient's mother died at the age of 94. The patient's father is alive at age 78. She had no brothers or sisters. Her father has a history of prostate cancer. There is no history of breast or ovarian cancer in the family. One maternal first cousin has a history of Hodgkin's lymphoma.  GYNECOLOGIC HISTORY:  Menarche age 33, first live birth age 19. She is GX P1. She underwent total abdominal hysterectomy and bilateral salpingo-oophorectomy in 1997 she did not take hormone replacement.  SOCIAL HISTORY: (Updated  11/15/2013)  She is a homemaker. Her husband [name] [name] farms approximately 500 acres, including quite a bit of tobacco. 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 The patient has no grandchildren. She has two "grand cats". She attends a local united church of [place]: Not in place   HEALTH MAINTENANCE:  (Updated   11/15/2013 ) Social History   Tobacco Use  . Smoking status: Never Smoker  . Smokeless tobacco: Never Used  Substance Use Topics  . Alcohol use: No    Comment: 07/28/2013 "no alcohol since 1994; never had problem w/it"  . Drug use: No     Colonoscopy: 2009/ [facility]  PAP: Status post hysterectomy  Bone density: May 10/14/2009 at [facility] was normal  Lipid panel:  Not on file   Allergies  Allergen Reactions  . Lasix [Furosemide] Nausea And Vomiting and Other (See Comments)    HEADACHE  . Lithium Other (See Comments)    Dizziness, "cause me to fall"  . Sulfa Antibiotics Hives  . Trazodone And Nefazodone Other (See Comments)    insomnia  . Lyrica [Pregabalin] Diarrhea, Nausea Only and Rash    Current Outpatient Medications  Medication Sig Dispense Refill  . ALPRAZolam (XANAX) 0.5 MG tablet Take 0.5 mg by mouth 2 (two) times daily.     Marland Kitchen amitriptyline (ELAVIL) 150 MG tablet Take 150 mg by mouth at bedtime.    Marland Kitchen anastrozole (ARIMIDEX) 1 MG tablet TAKE 1 TABLET(1 MG) BY MOUTH DAILY 90 tablet 0  . DULoxetine (CYMBALTA) 30 MG capsule Take 30 mg by mouth daily.    Marland Kitchen gabapentin (NEURONTIN) 300 MG capsule Take 349ms by mouth daily  4  . levothyroxine (SYNTHROID, LEVOTHROID) 88 MCG tablet Take 1 tablet daily by mouth.  4  . minoxidil (ROGAINE) 2 % external solution Apply 1 application topically daily.     .Marland Kitchenomeprazole (PRILOSEC) 40 MG capsule TAKE 1 CAPSULE(40 MG) BY MOUTH EVERY MORNING 90 capsule 0  . oxyCODONE-acetaminophen (PERCOCET) 5-325 MG tablet Take 1-2 tablets by mouth every 4 (four) hours as needed for severe pain. 60 tablet 0  . REXULTI 2 MG TABS Take 1 tablet daily by mouth.  1   No current  facility-administered medications for this visit.     OBJECTIVE: 72year old white woman who appears stated age  V47   10/01/17 1255  BP: (!) 124/51  Pulse: 92  Resp: 18  Temp: 98.5 F (36.9 C)  SpO2: 98%     Body mass index is 33.52 kg/m.    ECOG FS:2 - Symptomatic, <50% confined to bed Filed Weights   10/01/17 1255  Weight:  195 lb 4.8 oz (88.6 kg)   Sclerae unicteric, EOMs intact Oropharynx clear and moist No cervical or supraclavicular adenopathy, no axillary or inguinal adenopathy Lungs no rales or rhonchi Heart regular rate and rhythm Abd soft, nontender, positive bowel sounds MSK no focal spinal tenderness, no upper extremity lymphedema Neuro: nonfocal, well oriented, appropriate affect Breasts: The right breast has undergone mastectomy.  There is no evidence of chest wall recurrence.  The left breast is benign.  Both axillae are benign.     LAB RESULTS: The LDH and beta-2 microglobulin are stable to minimally decreased  Lab Results  Component Value Date   WBC 42.8 (H) 09/29/2017   NEUTROABS 4.9 09/29/2017   HGB 9.7 (L) 09/29/2017   HCT 32.9 (L) 09/29/2017   MCV 76.8 (L) 09/29/2017   PLT 253 09/29/2017      Chemistry      Component Value Date/Time   NA 141 09/29/2017 1129   NA 140 03/25/2017 1300   K 4.6 09/29/2017 1129   K 4.2 03/25/2017 1300   CL 110 (H) 09/29/2017 1129   CO2 22 09/29/2017 1129   CO2 24 03/25/2017 1300   BUN 19 09/29/2017 1129   BUN 17.9 03/25/2017 1300   CREATININE 1.25 (H) 09/29/2017 1129   CREATININE 1.1 03/25/2017 1300      Component Value Date/Time   CALCIUM 9.2 09/29/2017 1129   CALCIUM 9.4 03/25/2017 1300   ALKPHOS 105 09/29/2017 1129   ALKPHOS 102 03/25/2017 1300   AST 18 09/29/2017 1129   AST 41 (H) 03/25/2017 1300   ALT 10 09/29/2017 1129   ALT 52 03/25/2017 1300   BILITOT <0.2 (L) 09/29/2017 1129   BILITOT 0.36 03/25/2017 1300      STUDIES: No results found.   ASSESSMENT: 72 y.o. BRCA negative [place] woman  (1) status post right breast lumpectomy and axillary lymph node dissection in 1997 for a stage I breast cancer, treated with adjuvant radiation and tamoxifen for 5 years  (2) status post right breast lower outer quadrant biopsy 07/13/2013 for a clinical T1c N0, stage IA invasive ductal carcinoma, grade 2, estrogen receptor 100% positive, progesterone receptor 13% positive, with an MIB-1 of 33%, and HER-2 amplification by CISH with a HER2/CEP 17 ratio of 3.19, and an average HER-2 copy number per cell of 4.15  (3) status post right mastectomy 07/28/2013 for a pT1c pN0, stage IA invasive ductal carcinoma, grade 3, with close but negative margins. Prognostic panel was not repeated  (4) completed weekly paclitaxel x12 12/06/2913, with trastuzumab/ pertuzumab every 3 weeks; pertuzumab was held with third dose on 11/01/2013 due to diarrhea, tried a half dose on cycle 4 again with diarrhea developing  (5) trastuzumab (started 09/20/2013) continued for 1 year, last dose 09/21/2014;  (a) final echocardiogram 09/07/2014 showed an ejection fraction of 55-60%  (6) anastrozole started May 2015;   (a) bone density 12/15/2013 normal  (b) bone density 02/01/2016 at [facility] was normal with a T score of -1.0   OTHER PROBLEMS:  (a) History of chronic lymphoid leukemia diagnosed by flow cytometry 06/29/2013, the cells being CD5, CD20 and CD23 positive, CD10 negative.   (1) right renal mass resected on 02/05/15, consisting of atypical lymphoid proliferation composed of monotonous small lymphocytes   (b) Anemia with a normal MCV and normal ferritin-- B-12 and folate normal  (c) the patient met with Dr. Harlow Mares and has decided against reconstruction  PLAN: Yelitza's visit today was complex so it is best to divided  into 3 parts.  As far as her breast cancer is concerned, she is now a little over 4 years out from definitive surgery for her breast cancer with no evidence of disease recurrence.  This is  very favorable.  She is tolerating anastrozole well.  She will have a bone density with her next mammogram in June.  As far as her chronic lymphoid leukemia is concerned we reviewed her absolute lymphocyte count all the way back to 4 years ago.  There has not been a doubling within 6 months and therefore from that point of view we are not obligated to treat.  On the other hand she is anemic and microcytic.  She has had very low iron studies.  She was asked by Dr. Sabra Heck to start an iron tablet but the patient has not done that yet.  We discussed iron deficiency at length.  The patient understands this is due to bleeding.  She did have a colonoscopy and upper endoscopy in March 2016.  She more recently had surgery for her back and she required a blood transfusion following that.  That seems to be the most likely reason for her iron deficiency although of course other possible causes have not been absolutely ruled out.  For now we are starting her on oral iron.  I am going to recheck her lab work including a reticulocyte count and haptoglobin (given her elevated LDH) in 2 weeks.  If all is going well then I will recheck her lab work in 3 months.  If she is unable to tolerate a daily tablet of iron (she understands that one twice daily would be better) we will try Feraheme.  She knows to call for any other issues that may develop before her next visit.   Deliana Avalos, Virgie Dad, MD  10/01/17 6:41 PM Medical Oncology and Hematology [facility] [address] [place], [place] [address] Tel. [phone]    Fax. [phone]  This document serves as a record of services personally performed by Lurline Del, MD. It was created on his behalf by Sheron Nightingale, a trained medical scribe. The creation of this record is based on the scribe's personal observations and the provider's statements to them.   I have reviewed the above documentation for accuracy and completeness, and I agree  with the above.

## 2017-10-01 ENCOUNTER — Inpatient Hospital Stay (HOSPITAL_BASED_OUTPATIENT_CLINIC_OR_DEPARTMENT_OTHER): Payer: Medicare Other | Admitting: Oncology

## 2017-10-01 VITALS — BP 124/51 | HR 92 | Temp 98.5°F | Resp 18 | Ht 64.0 in | Wt 195.3 lb

## 2017-10-01 DIAGNOSIS — C50511 Malignant neoplasm of lower-outer quadrant of right female breast: Secondary | ICD-10-CM

## 2017-10-01 DIAGNOSIS — Z923 Personal history of irradiation: Secondary | ICD-10-CM

## 2017-10-01 DIAGNOSIS — S32009K Unspecified fracture of unspecified lumbar vertebra, subsequent encounter for fracture with nonunion: Secondary | ICD-10-CM

## 2017-10-01 DIAGNOSIS — D509 Iron deficiency anemia, unspecified: Secondary | ICD-10-CM | POA: Insufficient documentation

## 2017-10-01 DIAGNOSIS — Z170 Estrogen receptor positive status [ER+]: Secondary | ICD-10-CM

## 2017-10-01 DIAGNOSIS — D508 Other iron deficiency anemias: Secondary | ICD-10-CM

## 2017-10-01 DIAGNOSIS — C9110 Chronic lymphocytic leukemia of B-cell type not having achieved remission: Secondary | ICD-10-CM | POA: Diagnosis not present

## 2017-10-01 DIAGNOSIS — Z79811 Long term (current) use of aromatase inhibitors: Secondary | ICD-10-CM | POA: Diagnosis not present

## 2017-10-08 DIAGNOSIS — H35363 Drusen (degenerative) of macula, bilateral: Secondary | ICD-10-CM | POA: Diagnosis not present

## 2017-10-08 DIAGNOSIS — D3140 Benign neoplasm of unspecified ciliary body: Secondary | ICD-10-CM | POA: Diagnosis not present

## 2017-10-08 DIAGNOSIS — H2513 Age-related nuclear cataract, bilateral: Secondary | ICD-10-CM | POA: Diagnosis not present

## 2017-10-08 DIAGNOSIS — H35033 Hypertensive retinopathy, bilateral: Secondary | ICD-10-CM | POA: Diagnosis not present

## 2017-10-12 ENCOUNTER — Telehealth: Payer: Self-pay | Admitting: Oncology

## 2017-10-12 NOTE — Telephone Encounter (Signed)
Patient called to reschedule  °

## 2017-10-14 DIAGNOSIS — H25812 Combined forms of age-related cataract, left eye: Secondary | ICD-10-CM | POA: Diagnosis not present

## 2017-10-14 DIAGNOSIS — H2512 Age-related nuclear cataract, left eye: Secondary | ICD-10-CM | POA: Diagnosis not present

## 2017-10-15 ENCOUNTER — Other Ambulatory Visit: Payer: Medicare Other

## 2017-10-16 ENCOUNTER — Other Ambulatory Visit: Payer: Medicare Other

## 2017-10-16 ENCOUNTER — Telehealth: Payer: Self-pay | Admitting: Oncology

## 2017-10-16 NOTE — Telephone Encounter (Signed)
Patient called to r/s upcoming February appointments due to eye surgery .

## 2017-10-20 ENCOUNTER — Inpatient Hospital Stay: Payer: Medicare Other

## 2017-10-20 DIAGNOSIS — H2511 Age-related nuclear cataract, right eye: Secondary | ICD-10-CM | POA: Diagnosis not present

## 2017-10-20 DIAGNOSIS — D508 Other iron deficiency anemias: Secondary | ICD-10-CM

## 2017-10-20 DIAGNOSIS — C50511 Malignant neoplasm of lower-outer quadrant of right female breast: Secondary | ICD-10-CM | POA: Diagnosis not present

## 2017-10-20 DIAGNOSIS — H25011 Cortical age-related cataract, right eye: Secondary | ICD-10-CM | POA: Diagnosis not present

## 2017-10-20 DIAGNOSIS — Z170 Estrogen receptor positive status [ER+]: Secondary | ICD-10-CM

## 2017-10-20 DIAGNOSIS — S32009K Unspecified fracture of unspecified lumbar vertebra, subsequent encounter for fracture with nonunion: Secondary | ICD-10-CM

## 2017-10-20 DIAGNOSIS — C9110 Chronic lymphocytic leukemia of B-cell type not having achieved remission: Secondary | ICD-10-CM

## 2017-10-20 LAB — RETICULOCYTES
RBC.: 4.47 MIL/uL (ref 3.70–5.45)
RETIC CT PCT: 1.6 % (ref 0.7–2.1)
Retic Count, Absolute: 71.5 10*3/uL (ref 33.7–90.7)

## 2017-10-20 LAB — LACTATE DEHYDROGENASE: LDH: 247 U/L — ABNORMAL HIGH (ref 125–245)

## 2017-10-21 LAB — HAPTOGLOBIN: Haptoglobin: 134 mg/dL (ref 34–200)

## 2017-11-04 ENCOUNTER — Other Ambulatory Visit: Payer: Self-pay | Admitting: Oncology

## 2017-11-04 DIAGNOSIS — C9110 Chronic lymphocytic leukemia of B-cell type not having achieved remission: Secondary | ICD-10-CM

## 2017-11-04 DIAGNOSIS — H2511 Age-related nuclear cataract, right eye: Secondary | ICD-10-CM | POA: Diagnosis not present

## 2017-11-04 DIAGNOSIS — H25811 Combined forms of age-related cataract, right eye: Secondary | ICD-10-CM | POA: Diagnosis not present

## 2017-11-04 DIAGNOSIS — C50011 Malignant neoplasm of nipple and areola, right female breast: Secondary | ICD-10-CM

## 2017-11-19 ENCOUNTER — Telehealth: Payer: Self-pay

## 2017-11-19 NOTE — Telephone Encounter (Signed)
Patient called inquiring about most recent lab results. Patient states she received a call after previous labs reassuring that her lab results were "ok". Patient would like a call to discuss most recent lab results. Patient states ok to leave voicemail if not answered. [phone]

## 2017-11-23 ENCOUNTER — Other Ambulatory Visit: Payer: Self-pay | Admitting: Oncology

## 2017-11-23 NOTE — Progress Notes (Unsigned)
Try to call [name] [name] on herself to leave her a message regarding her labs which are okay except that she is iron deficient.  She has not started the iron pills that she needs to do that.  She has started they are not working and she will need Feraheme.

## 2017-11-24 ENCOUNTER — Other Ambulatory Visit: Payer: Self-pay | Admitting: Adult Health

## 2017-11-24 ENCOUNTER — Telehealth: Payer: Self-pay

## 2017-11-24 NOTE — Telephone Encounter (Signed)
Called pt this am regarding recent results and need for ferraheme per Dr Jana Hakim.  Spoke with pt and let her know we would call her back with appt date/time for infusions.  Per Dr Jana Hakim if taking iron pills then will have to schedule her for ferraheme infusion x 2 - one week apart and appt prior to the first appt to discuss side effects with Mendel Ryder NP.  Pt has been taking her iron and voiced understanding of plan of care.  Wilber Bihari NP put in orders for ferrheme and I notified Juanda Crumble managed care and awaiting approval- I left pt a VM to let her know it is being approved and we will call her with appts once that is completed.

## 2017-11-25 DIAGNOSIS — Z6833 Body mass index (BMI) 33.0-33.9, adult: Secondary | ICD-10-CM | POA: Diagnosis not present

## 2017-11-25 DIAGNOSIS — I10 Essential (primary) hypertension: Secondary | ICD-10-CM | POA: Diagnosis not present

## 2017-11-25 DIAGNOSIS — M48061 Spinal stenosis, lumbar region without neurogenic claudication: Secondary | ICD-10-CM | POA: Diagnosis not present

## 2017-12-18 ENCOUNTER — Other Ambulatory Visit: Payer: Self-pay | Admitting: Oncology

## 2017-12-18 DIAGNOSIS — C50011 Malignant neoplasm of nipple and areola, right female breast: Secondary | ICD-10-CM

## 2017-12-18 DIAGNOSIS — C9110 Chronic lymphocytic leukemia of B-cell type not having achieved remission: Secondary | ICD-10-CM

## 2017-12-23 ENCOUNTER — Other Ambulatory Visit: Payer: Self-pay

## 2017-12-23 DIAGNOSIS — C50511 Malignant neoplasm of lower-outer quadrant of right female breast: Secondary | ICD-10-CM

## 2017-12-23 DIAGNOSIS — Z170 Estrogen receptor positive status [ER+]: Principal | ICD-10-CM

## 2017-12-24 ENCOUNTER — Inpatient Hospital Stay: Payer: Medicare Other | Attending: Oncology

## 2017-12-24 DIAGNOSIS — Z79810 Long term (current) use of selective estrogen receptor modulators (SERMs): Secondary | ICD-10-CM | POA: Diagnosis not present

## 2017-12-24 DIAGNOSIS — Z923 Personal history of irradiation: Secondary | ICD-10-CM | POA: Insufficient documentation

## 2017-12-24 DIAGNOSIS — Z79899 Other long term (current) drug therapy: Secondary | ICD-10-CM | POA: Diagnosis not present

## 2017-12-24 DIAGNOSIS — Z170 Estrogen receptor positive status [ER+]: Secondary | ICD-10-CM | POA: Insufficient documentation

## 2017-12-24 DIAGNOSIS — C50511 Malignant neoplasm of lower-outer quadrant of right female breast: Secondary | ICD-10-CM

## 2017-12-24 DIAGNOSIS — C9110 Chronic lymphocytic leukemia of B-cell type not having achieved remission: Secondary | ICD-10-CM

## 2017-12-24 LAB — CMP (CANCER CENTER ONLY)
ALT: 19 U/L (ref 0–55)
ANION GAP: 8 (ref 3–11)
AST: 29 U/L (ref 5–34)
Albumin: 4.4 g/dL (ref 3.5–5.0)
Alkaline Phosphatase: 102 U/L (ref 40–150)
BILIRUBIN TOTAL: 0.2 mg/dL (ref 0.2–1.2)
BUN: 17 mg/dL (ref 7–26)
CO2: 23 mmol/L (ref 22–29)
Calcium: 9.6 mg/dL (ref 8.4–10.4)
Chloride: 109 mmol/L (ref 98–109)
Creatinine: 1.22 mg/dL — ABNORMAL HIGH (ref 0.60–1.10)
GFR, EST AFRICAN AMERICAN: 50 mL/min — AB (ref >60)
GFR, Estimated: 43 mL/min — ABNORMAL LOW (ref >60)
Glucose, Bld: 146 mg/dL — ABNORMAL HIGH (ref 70–140)
POTASSIUM: 4.3 mmol/L (ref 3.5–5.1)
Sodium: 140 mmol/L (ref 136–145)
TOTAL PROTEIN: 7 g/dL (ref 6.4–8.3)

## 2017-12-24 LAB — CBC WITH DIFFERENTIAL (CANCER CENTER ONLY)
Basophils Absolute: 0.3 10*3/uL — ABNORMAL HIGH (ref 0.0–0.1)
Basophils Relative: 1 %
EOS ABS: 0.5 10*3/uL (ref 0.0–0.5)
Eosinophils Relative: 1 %
HEMATOCRIT: 39.1 % (ref 34.8–46.6)
Hemoglobin: 12.2 g/dL (ref 11.6–15.9)
Lymphocytes Relative: 85 %
Lymphs Abs: 33.1 10*3/uL — ABNORMAL HIGH (ref 0.9–3.3)
MCH: 26.5 pg (ref 25.1–34.0)
MCHC: 31.2 g/dL — AB (ref 31.5–36.0)
MCV: 84.7 fL (ref 79.5–101.0)
MONO ABS: 0.8 10*3/uL (ref 0.1–0.9)
MONOS PCT: 2 %
NEUTROS ABS: 4.2 10*3/uL (ref 1.5–6.5)
Neutrophils Relative %: 11 %
Platelet Count: 209 10*3/uL (ref 145–400)
RBC: 4.61 MIL/uL (ref 3.70–5.45)
RDW: 19 % — AB (ref 11.2–14.5)
WBC Count: 38.9 10*3/uL — ABNORMAL HIGH (ref 3.9–10.3)

## 2017-12-24 LAB — LACTATE DEHYDROGENASE: LDH: 275 U/L — AB (ref 125–245)

## 2017-12-25 LAB — BETA 2 MICROGLOBULIN, SERUM: Beta-2 Microglobulin: 3.9 mg/L — ABNORMAL HIGH (ref 0.6–2.4)

## 2017-12-28 NOTE — Progress Notes (Signed)
ID: [name] OB: 15-Apr-1946  MR#: [id number]  UVO#:[id number]  Patient Care Team: Deland Pretty, MD as PCP - General (Internal Medicine) Jaryn Hocutt, Virgie Dad, MD as Consulting Physician (Oncology) Milus Banister, MD as Attending Physician (Gastroenterology) Megan Salon, MD as Consulting Physician (Gynecology) Donnie Mesa, MD as Consulting Physician (General Surgery) Noemi Chapel, NP as Nurse Practitioner  CHIEF COMPLAINT: Estrogen receptor positive Right Breast Cancer; chronic lymphoid leukemia  CURRENT TREATMENT: Anastrozole  BREAST CANCER HISTORY: From the prior summary:  [name] [name] has a history of breast cancer dating back to 1997, [name] performed a right lumpectomy and axillary lymph node dissection for what according to the patient and her family was a stage I invasive ductal breast cancer. She received radiation adjuvantly and then took tamoxifen for 5 years  In February of 2014 she had a normal screening mammography, but in November 2014 she felt a "hard lump" in her right breast. She brought this to the attention of her urologist, Dr. Izora Gala, and he set her up for bilateral diagnostic mammography with mammography at [facility] 07/13/2013. This showed her breast density to be category B. There was no mammographic abnormality noted but ultrasonography showed a 1.4 cm irregularly shaped solid mass in the right breast at the 8:00 position. This was biopsied the same day, and showed ([id number]) an invasive ductal carcinoma, grade 2, estrogen receptor 100% positive, progesterone receptor 13% positive, with an MIB-1 of 33% and HER-2 amplification with a HER-2: CEP 17 ratio of 3.19, and a HER-2 copy number percent all of 4.15.  The patient's subsequent history is as detailed below  INTERVAL HISTORY: [name] [name] returns today for follow-up of her estrogen receptor positive breast cancer. She continues on anastrozole, with good tolerance. She is experiencing some hair thinning.  She has occasional hot flashes if she is being active. She denies issues with vaginal dryness.   She will be due for mammography at [facility] in June 2019.   We also suggested that she start iron supplementation for her anemia.  She takes 2 tablets a day, and that has taken care of the anemia problem.  Her hemoglobin currently is over 12  Finally we follow her for her chronic lymphoid leukemia.  The total white cell count and the beta-2 microglobulin are both slightly down, also favorable   REVIEW OF SYSTEMS: [name] [name] reports that her medications are helping her sleep at night. She stopped using Rogaine for her hair. She doesn't exercise much, but when she does, she uses a stationary bike.  She denies any unexplained weight loss, unexplained fatigue, drenching night sweats, fevers, rash, or pruritus.  She denies unusual headaches, visual changes, nausea, vomiting, or dizziness. There has been no unusual cough, phlegm production, or pleurisy. This been no change in bowel or bladder habits. She denies unexplained fatigue or unexplained weight loss, bleeding, rash, or fever. A detailed review of systems was otherwise stable.    PAST MEDICAL HISTORY: Past Medical History:  Diagnosis Date  . Alopecia   . Anxiety   . Arthritis    "spine" (07/28/2013)  . Breast cancer ([place]) 1997; 2014   right  . CAP (community acquired pneumonia)    admission 06-04-2013, failed outpatient  . Chronic back pain    "lower back and upper neck" (07/28/2013)  . CLL (chronic lymphocytic leukemia) ([place]) 05/2013  . DDD (degenerative disc disease)   . 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 ear   . Depression   . Diverticulosis   . Elevated LFTs   .  Fibromyalgia 1978  . GERD (gastroesophageal reflux disease)   . Hematuria   . History of syncope    episode 2008--  no recurrence since  . HLD (hyperlipidemia)   . Hypothyroidism   . Kidney stones 2014  . Plantar fasciitis   . Ruptured disk    one in neck and two in back  . S/P chemotherapy,  time since greater than 12 weeks   . Sinus tachycardia    mild resting  . Skin cancer   . Stool incontinence    "at times recently"   . Urgency of urination   . Wears glasses     PAST SURGICAL HISTORY: Past Surgical History:  Procedure Laterality Date  . ABDOMINAL HYSTERECTOMY    . ANTERIOR LATERAL LUMBAR FUSION 4 LEVELS Left 04/25/2016   Procedure: LEFT LUMBAR ONE-TWO, LUMBAR TWO-THREE, LUMBAR THREE-FOUR, LUMBAR FOUR-FIVE ANTERIOR LATERAL LUMBAR FUSION;  Surgeon: Earnie Larsson, MD;  Location: [place] [facility];  Service: Neurosurgery;  Laterality: Left;  . APPENDECTOMY  1997  . APPLICATION OF ROBOTIC ASSISTANCE FOR SPINAL PROCEDURE N/A 04/06/2017   Procedure: APPLICATION OF ROBOTIC ASSISTANCE FOR SPINAL PROCEDURE;  Surgeon: Earnie Larsson, MD;  Location: [place];  Service: Neurosurgery;  Laterality: N/A;  . BREAST BIOPSY Right 2014  . BREAST LUMPECTOMY WITH AXILLARY LYMPH NODE DISSECTION Right 1997  . CYSTOSCOPY WITH RETROGRADE PYELOGRAM, URETEROSCOPY AND STENT PLACEMENT Bilateral 06/17/2013   Procedure: CYSTOSCOPY WITH RETROGRADE PYELOGRAM, URETEROSCOPY AND LEFT DOUBLE  J STENT PLACEMENT RIGHT URETERAL HOLMIIUM LASER AND DOUBLE J STENT ;  Surgeon: Hanley Ben, MD;  Location: [place];  Service: Urology;  Laterality: Bilateral;  . CYSTOSCOPY WITH URETEROSCOPY, STONE BASKETRY AND STENT PLACEMENT Bilateral 2014  . HOLMIUM LASER APPLICATION Bilateral 21/30/8657   Procedure: HOLMIUM LASER APPLICATION;  Surgeon: Hanley Ben, MD;  Location: [place];  Service: Urology;  Laterality: Bilateral;  . LITHOTRIPSY Left ~ 06/2013  . LUMBAR EPIDURAL INJECTION     has had 7 injections  . MASTECTOMY COMPLETE / SIMPLE Right 07/28/2013  . MASTECTOMY W/ NODES PARTIAL Right 1997  . PORT-A-CATH REMOVAL Left 10/03/2014   Procedure: REMOVAL PORT-A-CATH;  Surgeon: Donnie Mesa, MD;  Location: [place];  Service: General;  Laterality: Left;  . PORTACATH PLACEMENT  Left 07/28/2013   Procedure: ATTEMPTED INSERTION PORT-A-CATH;  Surgeon: Imogene Burn. Georgette Dover, MD;  Location: [place];  Service: General;  Laterality: Left;  . POSTERIOR LUMBAR FUSION 4 LEVEL N/A 04/25/2016   Procedure: LUMBAR FIVE-SACRAL ONE POSTERIOR LUMBAR INTERBODY FUSION, THORACIC NINE-SACRAL ONE POSTERIOR LATERAL ARTHRODESIS WITH PEDICLE SCREWS;  Surgeon: Earnie Larsson, MD;  Location: [place] [facility];  Service: Neurosurgery;  Laterality: N/A;  . RETINAL DETACHMENT SURGERY Right 2013  . ROBOTIC ASSITED PARTIAL NEPHRECTOMY Right 02/05/2015   Procedure: ROBOTIC ASSITED PARTIAL NEPHRECTOMY;  Surgeon: Raynelle Bring, MD;  Location: [facility];  Service: Urology;  Laterality: Right;  . SIMPLE MASTECTOMY WITH AXILLARY SENTINEL NODE BIOPSY Right 07/28/2013   Procedure: RIGHT TOTAL  MASTECTOMY;  Surgeon: Imogene Burn. Georgette Dover, MD;  Location: [place];  Service: General;  Laterality: Right;  . TONSILLECTOMY  AGE 45  . TOTAL ABDOMINAL HYSTERECTOMY W/ BILATERAL SALPINGOOPHORECTOMY  1997  . TRANSTHORACIC ECHOCARDIOGRAM  05-11-2007  dr Ron Parker   normal lvf/  ef 65%    FAMILY HISTORY Family History  Problem Relation Age of Onset  . Heart disease Maternal Grandfather   . Diabetes Maternal Grandfather   . Colon cancer Maternal Aunt 79  . Brain cancer Paternal Grandmother  dx in 73s  . Dementia Mother   . Diabetes Mother   . Osteoporosis Mother   . Diabetes Maternal Aunt   . Prostate cancer Father 12  . Bipolar disorder Maternal Aunt   . Stroke Maternal Aunt   . Stomach cancer Paternal Uncle        dx in late 60s   the patient's mother died at the age of 76. The patient's father is alive at age 74. She had no brothers or sisters. Her father has a history of prostate cancer. There is no history of breast or ovarian cancer in the family. One maternal first cousin has a history of Hodgkin's lymphoma.  GYNECOLOGIC HISTORY:  Menarche age 64, first live birth age 45. She is GX P1. She underwent total abdominal hysterectomy and  bilateral salpingo-oophorectomy in 1997 she did not take hormone replacement.  SOCIAL HISTORY: (Updated  11/15/2013)  She is a homemaker. Her husband Arnette Norris farms approximately 500 acres, including quite a bit of tobacco. Daughter Colletta [place] lives in [place] where she works as a Pension scheme manager for a drug company. The patient has no grandchildren. She has two "grand cats". She attends a local united church of [place]: Not in place   HEALTH MAINTENANCE:  (Updated   11/15/2013 ) Social History   Tobacco Use  . Smoking status: Never Smoker  . Smokeless tobacco: Never Used  Substance Use Topics  . Alcohol use: No    Comment: 07/28/2013 "no alcohol since 1994; never had problem w/it"  . Drug use: No     Colonoscopy: 2009/ [facility]  PAP: Status post hysterectomy  Bone density: May 10/14/2009 at [facility] was normal  Lipid panel:  Not on file   Allergies  Allergen Reactions  . Lasix [Furosemide] Nausea And Vomiting and Other (See Comments)    HEADACHE  . Lithium Other (See Comments)    Dizziness, "cause me to fall"  . Sulfa Antibiotics Hives  . Trazodone And Nefazodone Other (See Comments)    insomnia  . Lyrica [Pregabalin] Diarrhea, Nausea Only and Rash    Current Outpatient Medications  Medication Sig Dispense Refill  . ALPRAZolam (XANAX) 0.5 MG tablet Take 0.5 mg by mouth 2 (two) times daily.     Marland Kitchen amitriptyline (ELAVIL) 150 MG tablet Take 150 mg by mouth at bedtime.    Marland Kitchen anastrozole (ARIMIDEX) 1 MG tablet TAKE 1 TABLET(1 MG) BY MOUTH DAILY 90 tablet 0  . DULoxetine (CYMBALTA) 30 MG capsule Take 30 mg by mouth daily.    Marland Kitchen gabapentin (NEURONTIN) 300 MG capsule Take 331ms by mouth daily  4  . levothyroxine (SYNTHROID, LEVOTHROID) 88 MCG tablet Take 1 tablet daily by mouth.  4  . minoxidil (ROGAINE) 2 % external solution Apply 1 application topically daily.     .Marland Kitchenomeprazole (PRILOSEC) 40 MG capsule TAKE 1 CAPSULE(40 MG) BY MOUTH EVERY  MORNING 90 capsule 0  . oxyCODONE-acetaminophen (PERCOCET) 5-325 MG tablet Take 1-2 tablets by mouth every 4 (four) hours as needed for severe pain. 60 tablet 0  . REXULTI 2 MG TABS Take 1 tablet daily by mouth.  1   No current facility-administered medications for this visit.     OBJECTIVE: 72year old white woman in no acute distress  Vitals:   12/29/17 1435  BP: 118/61  Pulse: 97  Resp: 18  Temp: 98.5 F (36.9 C)  SpO2: 95%     Body mass index is 32.79 kg/m.    ECOG FS:1 -  Symptomatic but completely ambulatory Filed Weights   12/29/17 1435  Weight: 191 lb (86.6 kg)   Sclerae unicteric, pupils round and equal Oropharynx clear and moist No cervical or supraclavicular adenopathy Lungs no rales or rhonchi Heart regular rate and rhythm Abd soft, nontender, positive bowel sounds MSK no focal spinal tenderness, no upper extremity lymphedema Neuro: nonfocal, well oriented, appropriate, somewhat whimsical affect Breasts: On the right she is status post mastectomy with no evidence of chest wall recurrence.  The left breast is benign.  Both axillae are benign.   LAB RESULTS: The beta-2 microglobulin is again slightly lower  Lab Results  Component Value Date   WBC 38.9 (H) 12/24/2017   NEUTROABS 4.2 12/24/2017   HGB 12.2 12/24/2017   HCT 39.1 12/24/2017   MCV 84.7 12/24/2017   PLT 209 12/24/2017      Chemistry      Component Value Date/Time   NA 140 12/24/2017 1337   NA 140 03/25/2017 1300   K 4.3 12/24/2017 1337   K 4.2 03/25/2017 1300   CL 109 12/24/2017 1337   CO2 23 12/24/2017 1337   CO2 24 03/25/2017 1300   BUN 17 12/24/2017 1337   BUN 17.9 03/25/2017 1300   CREATININE 1.22 (H) 12/24/2017 1337   CREATININE 1.1 03/25/2017 1300      Component Value Date/Time   CALCIUM 9.6 12/24/2017 1337   CALCIUM 9.4 03/25/2017 1300   ALKPHOS 102 12/24/2017 1337   ALKPHOS 102 03/25/2017 1300   AST 29 12/24/2017 1337   AST 41 (H) 03/25/2017 1300   ALT 19 12/24/2017 1337    ALT 52 03/25/2017 1300   BILITOT 0.2 12/24/2017 1337   BILITOT 0.36 03/25/2017 1300      STUDIES: She will be due for mammography in June 2019  ASSESSMENT: 72 y.o. BRCA negative [place] woman  (1) status post right breast lumpectomy and axillary lymph node dissection in 1997 for a stage I breast cancer, treated with adjuvant radiation and tamoxifen for 5 years  (2) status post right breast lower outer quadrant biopsy 07/13/2013 for a clinical T1c N0, stage IA invasive ductal carcinoma, grade 2, estrogen receptor 100% positive, progesterone receptor 13% positive, with an MIB-1 of 33%, and HER-2 amplification by CISH with a HER2/CEP 17 ratio of 3.19, and an average HER-2 copy number per cell of 4.15  (3) status post right mastectomy 07/28/2013 for a pT1c pN0, stage IA invasive ductal carcinoma, grade 3, with close but negative margins. Prognostic panel was not repeated  (4) completed weekly paclitaxel x12 12/06/2913, with trastuzumab/ pertuzumab every 3 weeks; pertuzumab was held with third dose on 11/01/2013 due to diarrhea, tried a half dose on cycle 4 again with diarrhea developing  (5) trastuzumab (started 09/20/2013) continued for 1 year, last dose 09/21/2014;  (a) final echocardiogram 09/07/2014 showed an ejection fraction of 55-60%  (6) anastrozole started May 2015;   (a) bone density 12/15/2013 normal  (b) bone density 02/01/2016 at [facility] was normal with a T score of -1.0   OTHER PROBLEMS:  (a) History of chronic lymphoid leukemia diagnosed by flow cytometry 06/29/2013, the cells being CD5, CD20 and CD23 positive, CD10 negative.   (1) right renal mass resected on 02/05/15, consisting of atypical lymphoid proliferation composed of monotonous small lymphocytes   (b) Anemia with a normal MCV and normal ferritin-- B-12 and folate normal; resolved  (c) the patient met with Dr. Harlow Mares and has decided against reconstruction  PLAN: [name] [name] is now 4 years out  from definitive  surgery for breast cancer with no evidence of disease recurrence.  This is very favorable.  She continues on tamoxifen, with good tolerance, and the plan is to continue that one more year at which point she will be able to "graduate" from breast cancer follow-up.  She is tolerating oral iron well and it has cleared her anemia.  I suggested she continue the oral iron and another year.  As far as her chronic lymphoid leukemia is concerned, it remained stable and does not require any treatment  At this point we are going to check lab work every 3 months and see her once a year.  She knows to call for any other issues that may develop before the next visit.   Jaquell Seddon, Virgie Dad, MD  12/29/17 2:48 PM Medical Oncology and Hematology [facility] [address] [place], [place] [address] Tel. [phone]    Fax. [phone]  This document serves as a record of services personally performed by Lurline Del, MD. It was created on his behalf by Sheron Nightingale, a trained medical scribe. The creation of this record is based on the scribe's personal observations and the provider's statements to them.  I have reviewed the above documentation for accuracy and completeness, and I agree with the above.

## 2017-12-29 ENCOUNTER — Inpatient Hospital Stay (HOSPITAL_BASED_OUTPATIENT_CLINIC_OR_DEPARTMENT_OTHER): Payer: Medicare Other | Admitting: Oncology

## 2017-12-29 ENCOUNTER — Telehealth: Payer: Self-pay | Admitting: Oncology

## 2017-12-29 VITALS — BP 118/61 | HR 97 | Temp 98.5°F | Resp 18 | Ht 64.0 in | Wt 191.0 lb

## 2017-12-29 DIAGNOSIS — Z923 Personal history of irradiation: Secondary | ICD-10-CM

## 2017-12-29 DIAGNOSIS — C9110 Chronic lymphocytic leukemia of B-cell type not having achieved remission: Secondary | ICD-10-CM

## 2017-12-29 DIAGNOSIS — C50511 Malignant neoplasm of lower-outer quadrant of right female breast: Secondary | ICD-10-CM | POA: Diagnosis not present

## 2017-12-29 DIAGNOSIS — Z79899 Other long term (current) drug therapy: Secondary | ICD-10-CM | POA: Diagnosis not present

## 2017-12-29 DIAGNOSIS — C50011 Malignant neoplasm of nipple and areola, right female breast: Secondary | ICD-10-CM

## 2017-12-29 DIAGNOSIS — Z170 Estrogen receptor positive status [ER+]: Secondary | ICD-10-CM | POA: Diagnosis not present

## 2017-12-29 DIAGNOSIS — Z79810 Long term (current) use of selective estrogen receptor modulators (SERMs): Secondary | ICD-10-CM | POA: Diagnosis not present

## 2017-12-29 MED ORDER — GABAPENTIN 300 MG PO CAPS: ORAL_CAPSULE | ORAL | 4 refills | Status: DC

## 2017-12-29 MED ORDER — ANASTROZOLE 1 MG PO TABS: 1.0000 mg | ORAL_TABLET | Freq: Every day | ORAL | 4 refills | Status: DC

## 2017-12-29 NOTE — Telephone Encounter (Signed)
Gave patient AVs and calendar of upcoming appointments.  °

## 2018-01-19 DIAGNOSIS — R062 Wheezing: Secondary | ICD-10-CM | POA: Diagnosis not present

## 2018-01-19 DIAGNOSIS — Z6833 Body mass index (BMI) 33.0-33.9, adult: Secondary | ICD-10-CM | POA: Diagnosis not present

## 2018-01-19 DIAGNOSIS — J0100 Acute maxillary sinusitis, unspecified: Secondary | ICD-10-CM | POA: Diagnosis not present

## 2018-01-19 DIAGNOSIS — R05 Cough: Secondary | ICD-10-CM | POA: Diagnosis not present

## 2018-01-29 DIAGNOSIS — Z6833 Body mass index (BMI) 33.0-33.9, adult: Secondary | ICD-10-CM | POA: Diagnosis not present

## 2018-01-29 DIAGNOSIS — J0100 Acute maxillary sinusitis, unspecified: Secondary | ICD-10-CM | POA: Diagnosis not present

## 2018-01-29 DIAGNOSIS — G629 Polyneuropathy, unspecified: Secondary | ICD-10-CM | POA: Diagnosis not present

## 2018-02-18 ENCOUNTER — Other Ambulatory Visit: Payer: Self-pay | Admitting: Oncology

## 2018-02-18 DIAGNOSIS — C9110 Chronic lymphocytic leukemia of B-cell type not having achieved remission: Secondary | ICD-10-CM

## 2018-02-18 DIAGNOSIS — C50011 Malignant neoplasm of nipple and areola, right female breast: Secondary | ICD-10-CM

## 2018-02-23 ENCOUNTER — Encounter: Payer: Self-pay | Admitting: Oncology

## 2018-02-23 DIAGNOSIS — Z1231 Encounter for screening mammogram for malignant neoplasm of breast: Secondary | ICD-10-CM | POA: Diagnosis not present

## 2018-02-23 DIAGNOSIS — Z853 Personal history of malignant neoplasm of breast: Secondary | ICD-10-CM | POA: Diagnosis not present

## 2018-03-02 DIAGNOSIS — J0100 Acute maxillary sinusitis, unspecified: Secondary | ICD-10-CM | POA: Diagnosis not present

## 2018-03-02 DIAGNOSIS — F339 Major depressive disorder, recurrent, unspecified: Secondary | ICD-10-CM | POA: Diagnosis not present

## 2018-03-02 DIAGNOSIS — H9202 Otalgia, left ear: Secondary | ICD-10-CM | POA: Diagnosis not present

## 2018-03-02 DIAGNOSIS — C9110 Chronic lymphocytic leukemia of B-cell type not having achieved remission: Secondary | ICD-10-CM | POA: Diagnosis not present

## 2018-03-16 DIAGNOSIS — H6502 Acute serous otitis media, left ear: Secondary | ICD-10-CM | POA: Diagnosis not present

## 2018-03-16 DIAGNOSIS — H9190 Unspecified hearing loss, unspecified ear: Secondary | ICD-10-CM | POA: Diagnosis not present

## 2018-03-16 DIAGNOSIS — H9191 Unspecified hearing loss, right ear: Secondary | ICD-10-CM | POA: Diagnosis not present

## 2018-03-16 DIAGNOSIS — J0191 Acute recurrent sinusitis, unspecified: Secondary | ICD-10-CM | POA: Diagnosis not present

## 2018-03-16 DIAGNOSIS — H903 Sensorineural hearing loss, bilateral: Secondary | ICD-10-CM | POA: Diagnosis not present

## 2018-03-29 ENCOUNTER — Other Ambulatory Visit: Payer: Self-pay

## 2018-03-30 ENCOUNTER — Telehealth: Payer: Self-pay | Admitting: Oncology

## 2018-03-30 ENCOUNTER — Other Ambulatory Visit: Payer: Self-pay | Admitting: Oncology

## 2018-03-30 ENCOUNTER — Inpatient Hospital Stay: Payer: Medicare Other

## 2018-03-30 DIAGNOSIS — C9110 Chronic lymphocytic leukemia of B-cell type not having achieved remission: Secondary | ICD-10-CM

## 2018-03-30 DIAGNOSIS — C50011 Malignant neoplasm of nipple and areola, right female breast: Secondary | ICD-10-CM

## 2018-03-30 NOTE — Telephone Encounter (Signed)
Patient called to reschedule  °

## 2018-04-01 ENCOUNTER — Other Ambulatory Visit: Payer: Self-pay

## 2018-04-01 DIAGNOSIS — Z170 Estrogen receptor positive status [ER+]: Principal | ICD-10-CM

## 2018-04-01 DIAGNOSIS — C50511 Malignant neoplasm of lower-outer quadrant of right female breast: Secondary | ICD-10-CM

## 2018-04-02 ENCOUNTER — Inpatient Hospital Stay: Payer: Medicare Other | Attending: Oncology

## 2018-04-02 DIAGNOSIS — C50511 Malignant neoplasm of lower-outer quadrant of right female breast: Secondary | ICD-10-CM | POA: Insufficient documentation

## 2018-04-02 DIAGNOSIS — C9110 Chronic lymphocytic leukemia of B-cell type not having achieved remission: Secondary | ICD-10-CM

## 2018-04-02 DIAGNOSIS — Z170 Estrogen receptor positive status [ER+]: Secondary | ICD-10-CM

## 2018-04-02 LAB — CBC WITH DIFFERENTIAL (CANCER CENTER ONLY)
BASOS ABS: 0 10*3/uL (ref 0.0–0.1)
BASOS PCT: 0 %
Band Neutrophils: 0 %
Blasts: 0 %
EOS PCT: 1 %
Eosinophils Absolute: 0.5 10*3/uL (ref 0.0–0.5)
HCT: 35.1 % (ref 34.8–46.6)
HEMOGLOBIN: 11.1 g/dL — AB (ref 11.6–15.9)
LYMPHS ABS: 43.8 10*3/uL — AB (ref 0.9–3.3)
Lymphocytes Relative: 91 %
MCH: 29.1 pg (ref 25.1–34.0)
MCHC: 31.6 g/dL (ref 31.5–36.0)
MCV: 92.1 fL (ref 79.5–101.0)
METAMYELOCYTES PCT: 0 %
MONO ABS: 1 10*3/uL — AB (ref 0.1–0.9)
MYELOCYTES: 0 %
Monocytes Relative: 2 %
NEUTROS PCT: 6 %
Neutro Abs: 2.9 10*3/uL (ref 1.5–6.5)
Other: 0 %
PLATELETS: 167 10*3/uL (ref 145–400)
PROMYELOCYTES RELATIVE: 0 %
RBC: 3.81 MIL/uL (ref 3.70–5.45)
RDW: 15.8 % — ABNORMAL HIGH (ref 11.2–14.5)
WBC: 48.2 10*3/uL — AB (ref 3.9–10.3)
nRBC: 0 /100 WBC

## 2018-04-02 LAB — CMP (CANCER CENTER ONLY)
ALK PHOS: 89 U/L (ref 38–126)
ALT: 24 U/L (ref 0–44)
AST: 28 U/L (ref 15–41)
Albumin: 4 g/dL (ref 3.5–5.0)
Anion gap: 9 (ref 5–15)
BUN: 20 mg/dL (ref 8–23)
CALCIUM: 9 mg/dL (ref 8.9–10.3)
CHLORIDE: 110 mmol/L (ref 98–111)
CO2: 24 mmol/L (ref 22–32)
CREATININE: 1.13 mg/dL — AB (ref 0.44–1.00)
GFR, EST AFRICAN AMERICAN: 55 mL/min — AB (ref >60)
GFR, EST NON AFRICAN AMERICAN: 47 mL/min — AB (ref >60)
Glucose, Bld: 86 mg/dL (ref 70–99)
Potassium: 4.6 mmol/L (ref 3.5–5.1)
Sodium: 143 mmol/L (ref 135–145)
Total Bilirubin: 0.3 mg/dL (ref 0.3–1.2)
Total Protein: 6.3 g/dL — ABNORMAL LOW (ref 6.5–8.1)

## 2018-04-02 LAB — LACTATE DEHYDROGENASE: LDH: 308 U/L — AB (ref 98–192)

## 2018-04-03 LAB — BETA 2 MICROGLOBULIN, SERUM: Beta-2 Microglobulin: 3.6 mg/L — ABNORMAL HIGH (ref 0.6–2.4)

## 2018-04-05 ENCOUNTER — Other Ambulatory Visit: Payer: Self-pay | Admitting: Oncology

## 2018-04-05 ENCOUNTER — Encounter: Payer: Self-pay | Admitting: Oncology

## 2018-04-15 DIAGNOSIS — M48061 Spinal stenosis, lumbar region without neurogenic claudication: Secondary | ICD-10-CM | POA: Diagnosis not present

## 2018-04-20 DIAGNOSIS — H9112 Presbycusis, left ear: Secondary | ICD-10-CM | POA: Diagnosis not present

## 2018-04-20 DIAGNOSIS — H9191 Unspecified hearing loss, right ear: Secondary | ICD-10-CM | POA: Diagnosis not present

## 2018-04-20 DIAGNOSIS — L989 Disorder of the skin and subcutaneous tissue, unspecified: Secondary | ICD-10-CM | POA: Diagnosis not present

## 2018-05-03 ENCOUNTER — Other Ambulatory Visit: Payer: Self-pay | Admitting: Neurosurgery

## 2018-05-03 DIAGNOSIS — M546 Pain in thoracic spine: Secondary | ICD-10-CM

## 2018-05-03 DIAGNOSIS — M48061 Spinal stenosis, lumbar region without neurogenic claudication: Secondary | ICD-10-CM

## 2018-05-11 ENCOUNTER — Ambulatory Visit
Admission: RE | Admit: 2018-05-11 | Discharge: 2018-05-11 | Disposition: A | Discharge status: Home / Self Care | Payer: Medicare Other | Source: Ambulatory Visit | Attending: Neurosurgery | Admitting: Neurosurgery

## 2018-05-11 DIAGNOSIS — M4327 Fusion of spine, lumbosacral region: Secondary | ICD-10-CM | POA: Diagnosis not present

## 2018-05-11 DIAGNOSIS — M5124 Other intervertebral disc displacement, thoracic region: Secondary | ICD-10-CM | POA: Diagnosis not present

## 2018-05-11 DIAGNOSIS — M48061 Spinal stenosis, lumbar region without neurogenic claudication: Secondary | ICD-10-CM

## 2018-05-11 DIAGNOSIS — M546 Pain in thoracic spine: Secondary | ICD-10-CM

## 2018-06-01 DIAGNOSIS — M546 Pain in thoracic spine: Secondary | ICD-10-CM | POA: Diagnosis not present

## 2018-06-03 DIAGNOSIS — Z961 Presence of intraocular lens: Secondary | ICD-10-CM | POA: Diagnosis not present

## 2018-06-03 DIAGNOSIS — H35033 Hypertensive retinopathy, bilateral: Secondary | ICD-10-CM | POA: Diagnosis not present

## 2018-06-03 DIAGNOSIS — H35371 Puckering of macula, right eye: Secondary | ICD-10-CM | POA: Diagnosis not present

## 2018-06-03 DIAGNOSIS — H04123 Dry eye syndrome of bilateral lacrimal glands: Secondary | ICD-10-CM | POA: Diagnosis not present

## 2018-06-08 DIAGNOSIS — L308 Other specified dermatitis: Secondary | ICD-10-CM | POA: Diagnosis not present

## 2018-06-08 DIAGNOSIS — L728 Other follicular cysts of the skin and subcutaneous tissue: Secondary | ICD-10-CM | POA: Diagnosis not present

## 2018-06-28 ENCOUNTER — Other Ambulatory Visit: Payer: Self-pay

## 2018-06-28 DIAGNOSIS — C9110 Chronic lymphocytic leukemia of B-cell type not having achieved remission: Secondary | ICD-10-CM

## 2018-06-29 ENCOUNTER — Inpatient Hospital Stay: Payer: Medicare Other | Attending: Oncology

## 2018-06-29 DIAGNOSIS — C9110 Chronic lymphocytic leukemia of B-cell type not having achieved remission: Secondary | ICD-10-CM

## 2018-06-29 DIAGNOSIS — C50511 Malignant neoplasm of lower-outer quadrant of right female breast: Secondary | ICD-10-CM | POA: Diagnosis not present

## 2018-06-29 LAB — CBC WITH DIFFERENTIAL (CANCER CENTER ONLY)
Abs Immature Granulocytes: 0.19 10*3/uL — ABNORMAL HIGH (ref 0.00–0.07)
BASOS ABS: 0.1 10*3/uL (ref 0.0–0.1)
BASOS PCT: 0 %
EOS ABS: 0.6 10*3/uL — AB (ref 0.0–0.5)
Eosinophils Relative: 1 %
HCT: 38.3 % (ref 36.0–46.0)
HEMOGLOBIN: 11.5 g/dL — AB (ref 12.0–15.0)
IMMATURE GRANULOCYTES: 0 %
LYMPHS ABS: 36.6 10*3/uL — AB (ref 0.7–4.0)
Lymphocytes Relative: 84 %
MCH: 28 pg (ref 26.0–34.0)
MCHC: 30 g/dL (ref 30.0–36.0)
MCV: 93.4 fL (ref 80.0–100.0)
MONOS PCT: 7 %
Monocytes Absolute: 3.1 10*3/uL — ABNORMAL HIGH (ref 0.1–1.0)
NEUTROS ABS: 3.6 10*3/uL (ref 1.7–7.7)
NEUTROS PCT: 8 %
PLATELETS: 180 10*3/uL (ref 150–400)
RBC: 4.1 MIL/uL (ref 3.87–5.11)
RDW: 13.7 % (ref 11.5–15.5)
WBC Count: 44.1 10*3/uL — ABNORMAL HIGH (ref 4.0–10.5)
nRBC: 0 % (ref 0.0–0.2)

## 2018-06-29 LAB — CMP (CANCER CENTER ONLY)
ALBUMIN: 4 g/dL (ref 3.5–5.0)
ALT: 20 U/L (ref 0–44)
ANION GAP: 8 (ref 5–15)
AST: 21 U/L (ref 15–41)
Alkaline Phosphatase: 89 U/L (ref 38–126)
BUN: 13 mg/dL (ref 8–23)
CALCIUM: 9.3 mg/dL (ref 8.9–10.3)
CO2: 25 mmol/L (ref 22–32)
Chloride: 110 mmol/L (ref 98–111)
Creatinine: 1.1 mg/dL — ABNORMAL HIGH (ref 0.44–1.00)
GFR, EST AFRICAN AMERICAN: 57 mL/min — AB (ref >60)
GFR, Estimated: 49 mL/min — ABNORMAL LOW (ref >60)
GLUCOSE: 76 mg/dL (ref 70–99)
POTASSIUM: 4.6 mmol/L (ref 3.5–5.1)
SODIUM: 143 mmol/L (ref 135–145)
Total Bilirubin: <0.2 mg/dL — ABNORMAL LOW (ref 0.3–1.2)
Total Protein: 6.7 g/dL (ref 6.5–8.1)

## 2018-06-29 LAB — LACTATE DEHYDROGENASE: LDH: 242 U/L — AB (ref 98–192)

## 2018-06-30 LAB — BETA 2 MICROGLOBULIN, SERUM: BETA 2 MICROGLOBULIN: 3.1 mg/L — AB (ref 0.6–2.4)

## 2018-07-13 DIAGNOSIS — E039 Hypothyroidism, unspecified: Secondary | ICD-10-CM | POA: Diagnosis not present

## 2018-07-13 DIAGNOSIS — Z7982 Long term (current) use of aspirin: Secondary | ICD-10-CM | POA: Diagnosis not present

## 2018-07-15 ENCOUNTER — Other Ambulatory Visit: Payer: Self-pay | Admitting: Oncology

## 2018-07-15 DIAGNOSIS — C9110 Chronic lymphocytic leukemia of B-cell type not having achieved remission: Secondary | ICD-10-CM | POA: Diagnosis not present

## 2018-07-15 DIAGNOSIS — E039 Hypothyroidism, unspecified: Secondary | ICD-10-CM | POA: Diagnosis not present

## 2018-07-15 DIAGNOSIS — C50011 Malignant neoplasm of nipple and areola, right female breast: Secondary | ICD-10-CM

## 2018-07-15 DIAGNOSIS — F339 Major depressive disorder, recurrent, unspecified: Secondary | ICD-10-CM | POA: Diagnosis not present

## 2018-07-15 DIAGNOSIS — Z0000 Encounter for general adult medical examination without abnormal findings: Secondary | ICD-10-CM | POA: Diagnosis not present

## 2018-07-15 DIAGNOSIS — Z87898 Personal history of other specified conditions: Secondary | ICD-10-CM | POA: Diagnosis not present

## 2018-07-27 DIAGNOSIS — M797 Fibromyalgia: Secondary | ICD-10-CM | POA: Diagnosis not present

## 2018-07-27 DIAGNOSIS — G8929 Other chronic pain: Secondary | ICD-10-CM | POA: Diagnosis not present

## 2018-07-27 DIAGNOSIS — M159 Polyosteoarthritis, unspecified: Secondary | ICD-10-CM | POA: Diagnosis not present

## 2018-08-05 ENCOUNTER — Other Ambulatory Visit: Payer: Self-pay | Admitting: Oncology

## 2018-08-05 DIAGNOSIS — C50011 Malignant neoplasm of nipple and areola, right female breast: Secondary | ICD-10-CM

## 2018-08-05 DIAGNOSIS — C9110 Chronic lymphocytic leukemia of B-cell type not having achieved remission: Secondary | ICD-10-CM

## 2018-08-09 ENCOUNTER — Other Ambulatory Visit: Payer: Self-pay | Admitting: *Deleted

## 2018-08-22 ENCOUNTER — Observation Stay (HOSPITAL_COMMUNITY)
Admission: EM | Admit: 2018-08-22 | Discharge: 2018-08-23 | Disposition: A | Discharge status: Home / Self Care | Payer: Medicare Other | Attending: Internal Medicine | Admitting: Internal Medicine

## 2018-08-22 ENCOUNTER — Encounter (HOSPITAL_COMMUNITY): Payer: Self-pay | Admitting: Internal Medicine

## 2018-08-22 ENCOUNTER — Other Ambulatory Visit: Payer: Self-pay

## 2018-08-22 DIAGNOSIS — F419 Anxiety disorder, unspecified: Secondary | ICD-10-CM | POA: Insufficient documentation

## 2018-08-22 DIAGNOSIS — M1990 Unspecified osteoarthritis, unspecified site: Secondary | ICD-10-CM | POA: Insufficient documentation

## 2018-08-22 DIAGNOSIS — F329 Major depressive disorder, single episode, unspecified: Secondary | ICD-10-CM | POA: Insufficient documentation

## 2018-08-22 DIAGNOSIS — G8929 Other chronic pain: Secondary | ICD-10-CM | POA: Diagnosis not present

## 2018-08-22 DIAGNOSIS — E785 Hyperlipidemia, unspecified: Secondary | ICD-10-CM | POA: Insufficient documentation

## 2018-08-22 DIAGNOSIS — E039 Hypothyroidism, unspecified: Secondary | ICD-10-CM | POA: Diagnosis not present

## 2018-08-22 DIAGNOSIS — L03114 Cellulitis of left upper limb: Secondary | ICD-10-CM | POA: Diagnosis not present

## 2018-08-22 DIAGNOSIS — M7989 Other specified soft tissue disorders: Secondary | ICD-10-CM | POA: Diagnosis not present

## 2018-08-22 DIAGNOSIS — C50511 Malignant neoplasm of lower-outer quadrant of right female breast: Secondary | ICD-10-CM

## 2018-08-22 DIAGNOSIS — Z170 Estrogen receptor positive status [ER+]: Secondary | ICD-10-CM

## 2018-08-22 DIAGNOSIS — Z905 Acquired absence of kidney: Secondary | ICD-10-CM | POA: Diagnosis not present

## 2018-08-22 DIAGNOSIS — D649 Anemia, unspecified: Secondary | ICD-10-CM | POA: Insufficient documentation

## 2018-08-22 DIAGNOSIS — D72820 Lymphocytosis (symptomatic): Secondary | ICD-10-CM

## 2018-08-22 DIAGNOSIS — I809 Phlebitis and thrombophlebitis of unspecified site: Secondary | ICD-10-CM | POA: Diagnosis not present

## 2018-08-22 DIAGNOSIS — Z882 Allergy status to sulfonamides status: Secondary | ICD-10-CM | POA: Insufficient documentation

## 2018-08-22 DIAGNOSIS — K219 Gastro-esophageal reflux disease without esophagitis: Secondary | ICD-10-CM | POA: Insufficient documentation

## 2018-08-22 DIAGNOSIS — C9110 Chronic lymphocytic leukemia of B-cell type not having achieved remission: Secondary | ICD-10-CM | POA: Diagnosis present

## 2018-08-22 DIAGNOSIS — M797 Fibromyalgia: Secondary | ICD-10-CM | POA: Insufficient documentation

## 2018-08-22 DIAGNOSIS — I82622 Acute embolism and thrombosis of deep veins of left upper extremity: Secondary | ICD-10-CM | POA: Diagnosis not present

## 2018-08-22 DIAGNOSIS — M549 Dorsalgia, unspecified: Secondary | ICD-10-CM | POA: Diagnosis not present

## 2018-08-22 LAB — CBC WITH DIFFERENTIAL/PLATELET
ABS IMMATURE GRANULOCYTES: 0.25 10*3/uL — AB (ref 0.00–0.07)
Basophils Absolute: 0.1 10*3/uL (ref 0.0–0.1)
Basophils Relative: 0 %
Eosinophils Absolute: 0.6 10*3/uL — ABNORMAL HIGH (ref 0.0–0.5)
Eosinophils Relative: 1 %
HCT: 36.8 % (ref 36.0–46.0)
Hemoglobin: 11 g/dL — ABNORMAL LOW (ref 12.0–15.0)
IMMATURE GRANULOCYTES: 0 %
LYMPHS ABS: 47.1 10*3/uL — AB (ref 0.7–4.0)
Lymphocytes Relative: 83 %
MCH: 28.5 pg (ref 26.0–34.0)
MCHC: 29.9 g/dL — ABNORMAL LOW (ref 30.0–36.0)
MCV: 95.3 fL (ref 80.0–100.0)
Monocytes Absolute: 4.6 10*3/uL — ABNORMAL HIGH (ref 0.1–1.0)
Monocytes Relative: 8 %
Neutro Abs: 4.5 10*3/uL (ref 1.7–7.7)
Neutrophils Relative %: 8 %
Platelets: 148 10*3/uL — ABNORMAL LOW (ref 150–400)
RBC: 3.86 MIL/uL — ABNORMAL LOW (ref 3.87–5.11)
RDW: 14.5 % (ref 11.5–15.5)
WBC: 57.1 10*3/uL (ref 4.0–10.5)
nRBC: 0 % (ref 0.0–0.2)

## 2018-08-22 LAB — COMPREHENSIVE METABOLIC PANEL
ALT: 17 U/L (ref 0–44)
AST: 24 U/L (ref 15–41)
Albumin: 3.9 g/dL (ref 3.5–5.0)
Alkaline Phosphatase: 69 U/L (ref 38–126)
Anion gap: 8 (ref 5–15)
BUN: 14 mg/dL (ref 8–23)
CO2: 22 mmol/L (ref 22–32)
CREATININE: 0.96 mg/dL (ref 0.44–1.00)
Calcium: 8.8 mg/dL — ABNORMAL LOW (ref 8.9–10.3)
Chloride: 111 mmol/L (ref 98–111)
GFR calc Af Amer: >60 mL/min (ref >60)
GFR calc non Af Amer: 59 mL/min — ABNORMAL LOW (ref >60)
Glucose, Bld: 105 mg/dL — ABNORMAL HIGH (ref 70–99)
Potassium: 4 mmol/L (ref 3.5–5.1)
Sodium: 141 mmol/L (ref 135–145)
Total Bilirubin: 0.8 mg/dL (ref 0.3–1.2)
Total Protein: 6.1 g/dL — ABNORMAL LOW (ref 6.5–8.1)

## 2018-08-22 LAB — I-STAT CG4 LACTIC ACID, ED: Lactic Acid, Venous: 1.38 mmol/L (ref 0.5–1.9)

## 2018-08-22 LAB — CK: Total CK: 133 U/L (ref 38–234)

## 2018-08-22 MED ORDER — VANCOMYCIN HCL IN DEXTROSE 1-5 GM/200ML-% IV SOLN
1000.0000 mg | Freq: Once | INTRAVENOUS | Status: AC
  Administered 2018-08-23: 1000 mg via INTRAVENOUS
  Filled 2018-08-22 (×2): qty 200

## 2018-08-22 MED ORDER — PANTOPRAZOLE SODIUM 40 MG PO TBEC: 40.0000 mg | DELAYED_RELEASE_TABLET | Freq: Every day | ORAL | Status: DC

## 2018-08-22 MED ORDER — GABAPENTIN 300 MG PO CAPS
300.0000 mg | ORAL_CAPSULE | Freq: Every day | ORAL | Status: DC
  Administered 2018-08-23: 300 mg via ORAL
  Filled 2018-08-22: qty 1

## 2018-08-22 MED ORDER — LEVOTHYROXINE SODIUM 88 MCG PO TABS
88.0000 ug | ORAL_TABLET | Freq: Every day | ORAL | Status: DC
  Administered 2018-08-23: 88 ug via ORAL
  Filled 2018-08-22: qty 1

## 2018-08-22 MED ORDER — ACETAMINOPHEN 325 MG PO TABS: 650.0000 mg | ORAL_TABLET | Freq: Four times a day (QID) | ORAL | Status: DC | PRN

## 2018-08-22 MED ORDER — AMITRIPTYLINE HCL 50 MG PO TABS
150.0000 mg | ORAL_TABLET | Freq: Every day | ORAL | Status: DC
  Administered 2018-08-23: 150 mg via ORAL
  Filled 2018-08-22: qty 1
  Filled 2018-08-22: qty 6

## 2018-08-22 MED ORDER — SODIUM CHLORIDE 0.9 % IV SOLN
INTRAVENOUS | Status: DC
  Administered 2018-08-23: 05:00:00 via INTRAVENOUS

## 2018-08-22 MED ORDER — SODIUM CHLORIDE 0.9 % IV SOLN
1.0000 g | Freq: Once | INTRAVENOUS | Status: AC
  Administered 2018-08-22: 1 g via INTRAVENOUS
  Filled 2018-08-22: qty 1

## 2018-08-22 MED ORDER — ACETAMINOPHEN 650 MG RE SUPP: 650.0000 mg | Freq: Four times a day (QID) | RECTAL | Status: DC | PRN

## 2018-08-22 MED ORDER — OXYCODONE-ACETAMINOPHEN 5-325 MG PO TABS: 1.0000 | ORAL_TABLET | ORAL | Status: DC | PRN

## 2018-08-22 MED ORDER — ALPRAZOLAM 0.5 MG PO TABS
0.5000 mg | ORAL_TABLET | Freq: Two times a day (BID) | ORAL | Status: DC
  Administered 2018-08-23 (×2): 0.5 mg via ORAL
  Filled 2018-08-22 (×2): qty 1

## 2018-08-22 MED ORDER — BREXPIPRAZOLE 1 MG PO TABS
2.0000 mg | ORAL_TABLET | Freq: Every day | ORAL | Status: DC
  Administered 2018-08-23: 2 mg via ORAL
  Filled 2018-08-22 (×2): qty 2

## 2018-08-22 MED ORDER — ONDANSETRON HCL 4 MG PO TABS: 4.0000 mg | ORAL_TABLET | Freq: Four times a day (QID) | ORAL | Status: DC | PRN

## 2018-08-22 MED ORDER — DULOXETINE HCL 60 MG PO CPEP
90.0000 mg | ORAL_CAPSULE | Freq: Every day | ORAL | Status: DC
  Administered 2018-08-23: 90 mg via ORAL
  Filled 2018-08-22: qty 1

## 2018-08-22 MED ORDER — ENOXAPARIN SODIUM 40 MG/0.4ML ~~LOC~~ SOLN
40.0000 mg | Freq: Every day | SUBCUTANEOUS | Status: DC
  Administered 2018-08-23: 40 mg via SUBCUTANEOUS
  Filled 2018-08-22 (×2): qty 0.4

## 2018-08-22 MED ORDER — ANASTROZOLE 1 MG PO TABS
1.0000 mg | ORAL_TABLET | Freq: Every day | ORAL | Status: DC
  Administered 2018-08-23: 1 mg via ORAL
  Filled 2018-08-22 (×2): qty 1

## 2018-08-22 MED ORDER — ONDANSETRON HCL 4 MG/2ML IJ SOLN: 4.0000 mg | Freq: Four times a day (QID) | INTRAMUSCULAR | Status: DC | PRN

## 2018-08-22 NOTE — ED Provider Notes (Signed)
[facility] DEPT Provider Note   CSN: [id number] Arrival date & time: 08/22/18  1540     History   Chief Complaint Chief Complaint  Patient presents with  . arm knot  . erythema to left arm    HPI [name] [name] is a 72 y.o. female.  HPI  [name] [name] is a 72 y.o. female, with a history of breast cancer in remission, right sided mastectomy, CLL in remission, presenting to the ED with pain in the left upper arm for the last 3 days.  She states, "I felt some soreness in my left arm, when I went to rub my arm I felt a area of firmness." Patient notes her pain is moderate and aching, sometimes aching into the distal forearm.  Accompanied by erythema and swelling.  She was seen at urgent care and was advised to come to the ED for ultrasound to rule out blood clot. Denies recent procedures, phlebotomy, or injections. No  Denies fever, chest pain, shortness of breath, numbness, weakness, or any other complaints.    Past Medical History:  Diagnosis Date  . Alopecia   . Anxiety   . Arthritis    "spine" (07/28/2013)  . Breast cancer ([place]) 1997; 2014   right  . CAP (community acquired pneumonia)    admission 06-04-2013, failed outpatient  . Chronic back pain    "lower back and upper neck" (07/28/2013)  . CLL (chronic lymphocytic leukemia) ([place]) 05/2013  . DDD (degenerative disc disease)   . 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 ear   . Depression   . Diverticulosis   . Elevated LFTs   . Fibromyalgia 1978  . GERD (gastroesophageal reflux disease)   . Hematuria   . History of syncope    episode 2008--  no recurrence since  . HLD (hyperlipidemia)   . Hypothyroidism   . Kidney stones 2014  . Plantar fasciitis   . Ruptured disk    one in neck and two in back  . S/P chemotherapy, time since greater than 12 weeks   . Sinus tachycardia    mild resting  . Skin cancer   . Stool incontinence    "at times recently"   . Urgency of urination   . Wears glasses       Patient Active Problem List   Diagnosis Date Noted  . Iron deficiency anemia 10/01/2017  . 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  . Status post lumbar spine operative procedure for decompression of spinal cord 05/26/2016  . Depression 05/05/2016  . Degenerative spondylolisthesis 04/25/2016  . Spasm of muscle, back 04/25/2016  . Genetic testing 11/30/2015  . Essential hypertension 10/18/2015  . Morbid obesity ([place]) 10/18/2015  . Upper airway cough syndrome 10/17/2015  . Renal mass 02/05/2015  . Anemia 10/25/2013  . GERD (gastroesophageal reflux disease) 09/27/2013  . CLL (chronic lymphocytic leukemia) ([place]) 07/27/2013  . Malignant neoplasm of lower-outer quadrant of right breast of female, estrogen receptor positive ([place]) 07/26/2013  . Nephrolithiasis 06/16/2013  . 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 Chronic pain syndrome 05/10/2013  . DDD (degenerative disc disease) 05/10/2013  . SOB (shortness of breath)   . Sinus tachycardia   . HLD (hyperlipidemia)   . Orthostatic hypotension   . Anxiety   . Arthritis   . Diverticulosis   . Fibromyalgia   . Hypothyroidism     Past Surgical History:  Procedure Laterality Date  . ABDOMINAL HYSTERECTOMY    . ANTERIOR LATERAL LUMBAR FUSION 4  LEVELS Left 04/25/2016   Procedure: LEFT LUMBAR ONE-TWO, LUMBAR TWO-THREE, LUMBAR THREE-FOUR, LUMBAR FOUR-FIVE ANTERIOR LATERAL LUMBAR FUSION;  Surgeon: Earnie Larsson, MD;  Location: [facility];  Service: Neurosurgery;  Laterality: Left;  . APPENDECTOMY  1997  . APPLICATION OF ROBOTIC ASSISTANCE FOR SPINAL PROCEDURE N/A 04/06/2017   Procedure: APPLICATION OF ROBOTIC ASSISTANCE FOR SPINAL PROCEDURE;  Surgeon: Earnie Larsson, MD;  Location: [facility];  Service: Neurosurgery;  Laterality: N/A;  . BREAST BIOPSY Right 2014  . BREAST LUMPECTOMY WITH AXILLARY LYMPH NODE DISSECTION Right 1997  . CYSTOSCOPY WITH RETROGRADE PYELOGRAM, URETEROSCOPY AND STENT PLACEMENT Bilateral 06/17/2013   Procedure:  CYSTOSCOPY WITH RETROGRADE PYELOGRAM, URETEROSCOPY AND LEFT DOUBLE  J STENT PLACEMENT RIGHT URETERAL HOLMIIUM LASER AND DOUBLE J STENT ;  Surgeon: Hanley Ben, MD;  Location: [facility];  Service: Urology;  Laterality: Bilateral;  . CYSTOSCOPY WITH URETEROSCOPY, STONE BASKETRY AND STENT PLACEMENT Bilateral 2014  . HOLMIUM LASER APPLICATION Bilateral 70/62/3762   Procedure: HOLMIUM LASER APPLICATION;  Surgeon: Hanley Ben, MD;  Location: [facility];  Service: Urology;  Laterality: Bilateral;  . LITHOTRIPSY Left ~ 06/2013  . LUMBAR EPIDURAL INJECTION     has had 7 injections  . MASTECTOMY COMPLETE / SIMPLE Right 07/28/2013  . MASTECTOMY W/ NODES PARTIAL Right 1997  . PORT-A-CATH REMOVAL Left 10/03/2014   Procedure: REMOVAL PORT-A-CATH;  Surgeon: Donnie Mesa, MD;  Location: [facility];  Service: General;  Laterality: Left;  . PORTACATH PLACEMENT Left 07/28/2013   Procedure: ATTEMPTED INSERTION PORT-A-CATH;  Surgeon: Imogene Burn. Georgette Dover, MD;  Location: [facility];  Service: General;  Laterality: Left;  . POSTERIOR LUMBAR FUSION 4 LEVEL N/A 04/25/2016   Procedure: LUMBAR FIVE-SACRAL ONE POSTERIOR LUMBAR INTERBODY FUSION, THORACIC NINE-SACRAL ONE POSTERIOR LATERAL ARTHRODESIS WITH PEDICLE SCREWS;  Surgeon: Earnie Larsson, MD;  Location: [facility];  Service: Neurosurgery;  Laterality: N/A;  . RETINAL DETACHMENT SURGERY Right 2013  . ROBOTIC ASSITED PARTIAL NEPHRECTOMY Right 02/05/2015   Procedure: ROBOTIC ASSITED PARTIAL NEPHRECTOMY;  Surgeon: Raynelle Bring, MD;  Location: [facility];  Service: Urology;  Laterality: Right;  . SIMPLE MASTECTOMY WITH AXILLARY SENTINEL NODE BIOPSY Right 07/28/2013   Procedure: RIGHT TOTAL  MASTECTOMY;  Surgeon: Imogene Burn. Georgette Dover, MD;  Location: [facility];  Service: General;  Laterality: Right;  . TONSILLECTOMY  AGE 27  . TOTAL ABDOMINAL HYSTERECTOMY W/ BILATERAL SALPINGOOPHORECTOMY  1997  . TRANSTHORACIC ECHOCARDIOGRAM  05-11-2007  dr Ron Parker     normal lvf/  ef 65%     OB History    Gravida  1   Para  1   Term      Preterm      AB      Living  1     SAB      TAB      Ectopic      Multiple      Live Births               Home Medications    Prior to Admission medications   Medication Sig Start Date End Date Taking? Authorizing Provider  ALPRAZolam Duanne Moron) 0.5 MG tablet Take 0.5 mg by mouth 2 (two) times daily.     [provider]  amitriptyline (ELAVIL) 150 MG tablet Take 150 mg by mouth at bedtime.    [provider]  anastrozole (ARIMIDEX) 1 MG tablet Take 1 tablet (1 mg total) by mouth daily. 12/29/17   Magrinat, Virgie Dad, MD  anastrozole (ARIMIDEX) 1  MG tablet TAKE 1 TABLET(1 MG) BY MOUTH DAILY 08/05/18   Magrinat, Virgie Dad, MD  DULoxetine (CYMBALTA) 30 MG capsule Take 90 mg by mouth daily.    [provider]  gabapentin (NEURONTIN) 300 MG capsule Take 300mg s by mouth daily 12/29/17   Magrinat, Virgie Dad, MD  levothyroxine (SYNTHROID, LEVOTHROID) 88 MCG tablet Take 1 tablet daily by mouth. 06/30/17   [provider]  minoxidil (ROGAINE) 2 % external solution Apply 1 application topically daily.     [provider]  omeprazole (PRILOSEC) 40 MG capsule TAKE 1 CAPSULE(40 MG) BY MOUTH EVERY MORNING 07/15/18   Magrinat, Virgie Dad, MD  oxyCODONE-acetaminophen (PERCOCET) 5-325 MG tablet Take 1-2 tablets by mouth every 4 (four) hours as needed for severe pain. 04/07/17   Earnie Larsson, MD  REXULTI 2 MG TABS Take 1 tablet daily by mouth. 06/11/17   [provider]    Family History Family History  Problem Relation Age of Onset  . Heart disease Maternal Grandfather   . Diabetes Maternal Grandfather   . Colon cancer Maternal Aunt 79  . Brain cancer Paternal Grandmother        dx in 10s  . Dementia Mother   . Diabetes Mother   . Osteoporosis Mother   . Diabetes Maternal Aunt   . Prostate cancer Father 28  . Bipolar disorder Maternal Aunt   . Stroke Maternal  Aunt   . Stomach cancer Paternal Uncle        dx in late 61s    Social History Social History   Tobacco Use  . Smoking status: Never Smoker  . Smokeless tobacco: Never Used  Substance Use Topics  . Alcohol use: No    Comment: 07/28/2013 "no alcohol since 1994; never had problem w/it"  . Drug use: No     Allergies   Lasix [furosemide]; Lithium; Sulfa antibiotics; Trazodone and nefazodone; and Lyrica [pregabalin]   Review of Systems Review of Systems  Constitutional: Negative for chills, diaphoresis and fever.  Respiratory: Negative for shortness of breath.   Cardiovascular: Negative for chest pain.  Gastrointestinal: Negative for abdominal pain, nausea and vomiting.  Skin: Positive for color change.  Neurological: Negative for weakness and numbness.  All other systems reviewed and are negative.    Physical Exam Updated Vital Signs BP 138/69 (BP Location: Left Arm)   Pulse 90   Temp 99.2 F (37.3 C) (Oral)   Resp 18   LMP 08/26/1995   SpO2 99%   Physical Exam Vitals signs and nursing note reviewed.  Constitutional:      General: She is not in acute distress.    Appearance: She is well-developed. She is not diaphoretic.  HENT:     Head: Normocephalic and atraumatic.  Eyes:     Conjunctiva/sclera: Conjunctivae normal.  Neck:     Musculoskeletal: Neck supple.  Cardiovascular:     Rate and Rhythm: Normal rate and regular rhythm.     Pulses:          Radial pulses are 2+ on the right side and 2+ on the left side.     Heart sounds: Normal heart sounds.  Pulmonary:     Effort: Pulmonary effort is normal. No respiratory distress.     Breath sounds: Normal breath sounds.  Abdominal:     Palpations: Abdomen is soft.     Tenderness: There is no abdominal tenderness. There is no guarding.  Musculoskeletal:     Comments: Area of erythema, swelling, and  tenderness in the region of the distal left cephalic vein.  Firmness and tenderness along the path of cephalic  vein. No noted tenderness, swelling, erythema, or increased warmth along the deep venous system of the upper arm.  Lymphadenopathy:     Cervical: No cervical adenopathy.  Skin:    General: Skin is warm and dry.  Neurological:     Mental Status: She is alert.  Psychiatric:        Behavior: Behavior normal.          ED Treatments / Results  Labs (all labs ordered are listed, but only abnormal results are displayed) Labs Reviewed  CBC WITH DIFFERENTIAL/PLATELET - Abnormal; Notable for the following components:      Result Value   WBC 57.1 (*)    RBC 3.86 (*)    Hemoglobin 11.0 (*)    MCHC 29.9 (*)    Platelets 148 (*)    Lymphs Abs 47.1 (*)    Monocytes Absolute 4.6 (*)    Eosinophils Absolute 0.6 (*)    Abs Immature Granulocytes 0.25 (*)    All other components within normal limits  COMPREHENSIVE METABOLIC PANEL - Abnormal; Notable for the following components:   Glucose, Bld 105 (*)    Calcium 8.8 (*)    Total Protein 6.1 (*)    GFR calc non Af Amer 59 (*)    All other components within normal limits  CULTURE, BLOOD (ROUTINE X 2)  CULTURE, BLOOD (ROUTINE X 2)  CK  I-STAT CG4 LACTIC ACID, ED   WBC  Date Value Ref Range Status  08/22/2018 57.1 (HH) 4.0 - 10.5 K/uL Final    Comment:    REPEATED TO VERIFY WHITE COUNT CONFIRMED ON SMEAR THIS CRITICAL RESULT HAS VERIFIED AND BEEN CALLED TO A,WOOD BY AISHA MOHAMED ON 12 29 2019 AT 2238, AND HAS BEEN READ BACK.    09/29/2017 42.8 (H) 3.9 - 10.3 K/uL Final  04/01/2017 32.5 (H) 4.0 - 10.5 K/uL Final  03/25/2017 31.1 (H) 3.9 - 10.3 10e3/uL Final  10/15/2016 33.9 (H) 3.9 - 10.3 10e3/uL Final  05/07/2016 38.2 (H) 4.0 - 10.5 K/uL Final   WBC Count  Date Value Ref Range Status  06/29/2018 44.1 (H) 4.0 - 10.5 K/uL Final  04/02/2018 48.2 (H) 3.9 - 10.3 K/uL Final  12/24/2017 38.9 (H) 3.9 - 10.3 K/uL Final    EKG None  Radiology No results found.  Procedures Procedures (including critical care  time)  Medications Ordered in ED Medications  vancomycin (VANCOCIN) IVPB 1000 mg/200 mL premix (has no administration in time range)  ceFEPIme (MAXIPIME) 1 g in sodium chloride 0.9 % 100 mL IVPB (has no administration in time range)     Initial Impression / Assessment and Plan / ED Course  I have reviewed the triage vital signs and the nursing notes.  Pertinent labs & imaging results that were available during my care of the patient were reviewed by me and considered in my medical decision making (see chart for details).  Clinical Course as of Aug 22 2321  Sun Aug 22, 2018  2256 Spoke with Dr. Inda Merlin, oncology. Agrees with plan for admission for IV ABX. Does not need transfer to [facility].    [SJ]  [place] Spoke with Dr. Hal Hope, hospitalist. Agrees to admit the patient.    [SJ]    Clinical Course User Index [SJ] ,  C, PA-C    Patient presents with area of erythema, pain, and swelling to left upper arm. Patient  is nontoxic appearing, afebrile, not tachycardic, not tachypneic, not hypotensive, maintains excellent SPO2 on room air, and is in no apparent distress.  Dr. Darl Householder performed bedside ultrasound and noted evidence of thrombophlebitis in the cephalic vein. Patient has WBC count of 57.1, increased from 44.1 on November 5.  No blasts noted.  Due to this increasing WBC count and what appears to be a spontaneous thrombophlebitis, patient admitted for IV antibiotics.  Findings and plan of care discussed with Shirlyn Goltz, MD. Dr. Darl Householder personally evaluated and examined this patient.   Vitals:   08/22/18 1601 08/22/18 2034  BP: 138/69 (!) 148/90  Pulse: 90 92  Resp: 18 18  Temp: 99.2 F (37.3 C)   TempSrc: Oral   SpO2: 99% 99%      Final Clinical Impressions(s) / ED Diagnoses   Final diagnoses:  Arm swelling  Lymphocytosis    ED Discharge Orders    None       Layla Maw 08/22/18 2326    Drenda Freeze, MD 08/24/18 [phone]

## 2018-08-22 NOTE — H&P (Signed)
History and Physical    [name] [name] DGU:[id number] DOB: 09-14-1945 DOA: 08/22/2018  PCP: Deland Pretty, MD  Patient coming from: Home.  Chief Complaint: Left upper extremity pain and swelling.  HPI: [name] [name] is a 72 y.o. female with history of CLL and being observed by Dr. Jana Hakim, breast cancer in remission on anastrozole.  Hypothyroidism on Synthroid, depression and anxiety started experiencing pain and swelling of the left upper extremity last 3 days.  Denies any fever chills.  Denies any trauma.  ED Course: In the ER on exam patient has mild swelling of the left upper arm with some erythema.  Tender to touch mildly warm.  Patient lab work showed WBC count of 57,000 which has increased from baseline of around 40,000 with no history of CLL.  Patient also has mild thrombocytopenia and anemia.  As per the ER physician patient had a sonogram done at the bedside by the ER physician which showed superficial thrombophlebitis.  Patient was mildly febrile.  ER physician discussed with on-call oncologist Dr. Julien Nordmann since patient was having worsening WBC count at this time oncologist advised to treat with antibiotics and observe.  Review of Systems: As per HPI, rest all negative.   Past Medical History:  Diagnosis Date  . Alopecia   . Anxiety   . Arthritis    "spine" (07/28/2013)  . Breast cancer ([place]) 1997; 2014   right  . CAP (community acquired pneumonia)    admission 06-04-2013, failed outpatient  . Chronic back pain    "lower back and upper neck" (07/28/2013)  . CLL (chronic lymphocytic leukemia) ([place]) 05/2013  . DDD (degenerative disc disease)   . 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 ear   . Depression   . Diverticulosis   . Elevated LFTs   . Fibromyalgia 1978  . GERD (gastroesophageal reflux disease)   . Hematuria   . History of syncope    episode 2008--  no recurrence since  . HLD (hyperlipidemia)   . Hypothyroidism   . Kidney stones 2014  . Plantar fasciitis   . Ruptured disk     one in neck and two in back  . S/P chemotherapy, time since greater than 12 weeks   . Sinus tachycardia    mild resting  . Skin cancer   . Stool incontinence    "at times recently"   . Urgency of urination   . Wears glasses     Past Surgical History:  Procedure Laterality Date  . ABDOMINAL HYSTERECTOMY    . ANTERIOR LATERAL LUMBAR FUSION 4 LEVELS Left 04/25/2016   Procedure: LEFT LUMBAR ONE-TWO, LUMBAR TWO-THREE, LUMBAR THREE-FOUR, LUMBAR FOUR-FIVE ANTERIOR LATERAL LUMBAR FUSION;  Surgeon: Earnie Larsson, MD;  Location: [facility];  Service: Neurosurgery;  Laterality: Left;  . APPENDECTOMY  1997  . APPLICATION OF ROBOTIC ASSISTANCE FOR SPINAL PROCEDURE N/A 04/06/2017   Procedure: APPLICATION OF ROBOTIC ASSISTANCE FOR SPINAL PROCEDURE;  Surgeon: Earnie Larsson, MD;  Location: [facility];  Service: Neurosurgery;  Laterality: N/A;  . BREAST BIOPSY Right 2014  . BREAST LUMPECTOMY WITH AXILLARY LYMPH NODE DISSECTION Right 1997  . CYSTOSCOPY WITH RETROGRADE PYELOGRAM, URETEROSCOPY AND STENT PLACEMENT Bilateral 06/17/2013   Procedure: CYSTOSCOPY WITH RETROGRADE PYELOGRAM, URETEROSCOPY AND LEFT DOUBLE  J STENT PLACEMENT RIGHT URETERAL HOLMIIUM LASER AND DOUBLE J STENT ;  Surgeon: Hanley Ben, MD;  Location: [facility];  Service: Urology;  Laterality: Bilateral;  . CYSTOSCOPY WITH URETEROSCOPY, STONE BASKETRY AND STENT PLACEMENT Bilateral 2014  . HOLMIUM LASER  APPLICATION Bilateral 61/[id number]/7371   Procedure: HOLMIUM LASER APPLICATION;  Surgeon: Hanley Ben, MD;  Location: [facility];  Service: Urology;  Laterality: Bilateral;  . LITHOTRIPSY Left ~ 06/2013  . LUMBAR EPIDURAL INJECTION     has had 7 injections  . MASTECTOMY COMPLETE / SIMPLE Right 07/28/2013  . MASTECTOMY W/ NODES PARTIAL Right 1997  . PORT-A-CATH REMOVAL Left 10/03/2014   Procedure: REMOVAL PORT-A-CATH;  Surgeon: Donnie Mesa, MD;  Location: [facility];  Service: General;  Laterality:  Left;  . PORTACATH PLACEMENT Left 07/28/2013   Procedure: ATTEMPTED INSERTION PORT-A-CATH;  Surgeon: Imogene Burn. Georgette Dover, MD;  Location: [facility];  Service: General;  Laterality: Left;  . POSTERIOR LUMBAR FUSION 4 LEVEL N/A 04/25/2016   Procedure: LUMBAR FIVE-SACRAL ONE POSTERIOR LUMBAR INTERBODY FUSION, THORACIC NINE-SACRAL ONE POSTERIOR LATERAL ARTHRODESIS WITH PEDICLE SCREWS;  Surgeon: Earnie Larsson, MD;  Location: [facility];  Service: Neurosurgery;  Laterality: N/A;  . RETINAL DETACHMENT SURGERY Right 2013  . ROBOTIC ASSITED PARTIAL NEPHRECTOMY Right 02/05/2015   Procedure: ROBOTIC ASSITED PARTIAL NEPHRECTOMY;  Surgeon: Raynelle Bring, MD;  Location: [facility];  Service: Urology;  Laterality: Right;  . SIMPLE MASTECTOMY WITH AXILLARY SENTINEL NODE BIOPSY Right 07/28/2013   Procedure: RIGHT TOTAL  MASTECTOMY;  Surgeon: Imogene Burn. Georgette Dover, MD;  Location: [facility];  Service: General;  Laterality: Right;  . TONSILLECTOMY  AGE 36  . TOTAL ABDOMINAL HYSTERECTOMY W/ BILATERAL SALPINGOOPHORECTOMY  1997  . TRANSTHORACIC ECHOCARDIOGRAM  05-11-2007  dr Ron Parker   normal lvf/  ef 65%     reports that she has never smoked. She has never used smokeless tobacco. She reports that she does not drink alcohol or use drugs.  Allergies  Allergen Reactions  . Lasix [Furosemide] Nausea And Vomiting and Other (See Comments)    HEADACHE  . Lithium Other (See Comments)    Dizziness, "cause me to fall"  . Sulfa Antibiotics Hives  . Trazodone And Nefazodone Other (See Comments)    insomnia  . Lyrica [Pregabalin] Diarrhea, Nausea Only and Rash    Family History  Problem Relation Age of Onset  . Heart disease Maternal Grandfather   . Diabetes Maternal Grandfather   . Colon cancer Maternal Aunt 79  . Brain cancer Paternal Grandmother        dx in 24s  . Dementia Mother   . Diabetes Mother   . Osteoporosis Mother   . Diabetes Maternal Aunt   . Prostate cancer Father 24  . Bipolar disorder Maternal Aunt   . Stroke Maternal  Aunt   . Stomach cancer Paternal Uncle        dx in late 64s    Prior to Admission medications   Medication Sig Start Date End Date Taking? Authorizing Provider  ALPRAZolam Duanne Moron) 0.5 MG tablet Take 0.5 mg by mouth 2 (two) times daily.     [provider]  amitriptyline (ELAVIL) 150 MG tablet Take 150 mg by mouth at bedtime.    [provider]  anastrozole (ARIMIDEX) 1 MG tablet Take 1 tablet (1 mg total) by mouth daily. 12/29/17   Magrinat, Virgie Dad, MD  anastrozole (ARIMIDEX) 1 MG tablet TAKE 1 TABLET(1 MG) BY MOUTH DAILY 08/05/18   Magrinat, Virgie Dad, MD  DULoxetine (CYMBALTA) 30 MG capsule Take 90 mg by mouth daily.    [provider]  gabapentin (NEURONTIN) 300 MG capsule Take 300mg s by mouth daily 12/29/17   Magrinat, Virgie Dad, MD  levothyroxine (SYNTHROID, LEVOTHROID) 88 MCG  tablet Take 1 tablet daily by mouth. 06/30/17   [provider]  minoxidil (ROGAINE) 2 % external solution Apply 1 application topically daily.     [provider]  omeprazole (PRILOSEC) 40 MG capsule TAKE 1 CAPSULE(40 MG) BY MOUTH EVERY MORNING 07/15/18   Magrinat, Virgie Dad, MD  oxyCODONE-acetaminophen (PERCOCET) 5-325 MG tablet Take 1-2 tablets by mouth every 4 (four) hours as needed for severe pain. 04/07/17   Earnie Larsson, MD  REXULTI 2 MG TABS Take 1 tablet daily by mouth. 06/11/17   [provider]    Physical Exam: Vitals:   08/22/18 1601 08/22/18 2034  BP: 138/69 (!) 148/90  Pulse: 90 92  Resp: 18 18  Temp: 99.2 F (37.3 C)   TempSrc: Oral   SpO2: 99% 99%      Constitutional: Moderately built and nourished. Vitals:   08/22/18 1601 08/22/18 2034  BP: 138/69 (!) 148/90  Pulse: 90 92  Resp: 18 18  Temp: 99.2 F (37.3 C)   TempSrc: Oral   SpO2: 99% 99%   Eyes: Anicteric no pallor. ENMT: No discharge from the ears eyes nose or mouth. Neck: No mass felt.  No neck rigidity. Respiratory: No rhonchi or crepitations. Cardiovascular: S1-S2  heard. Abdomen: Soft nontender bowel sounds present. Musculoskeletal: Mild swelling and erythema of the left arm.  Pulses felt. Skin: Erythema of the left arm. Neurologic: Alert awake oriented to time place and person.  Moves all extremities. Psychiatric: Appears normal.   Labs on Admission: I have personally reviewed following labs and imaging studies  CBC: Recent Labs  Lab 08/22/18 2104  WBC 57.1*  NEUTROABS 4.5  HGB 11.0*  HCT 36.8  MCV 95.3  PLT 403*   Basic Metabolic Panel: Recent Labs  Lab 08/22/18 2104  NA 141  K 4.0  CL 111  CO2 22  GLUCOSE 105*  BUN 14  CREATININE 0.96  CALCIUM 8.8*   GFR: CrCl cannot be calculated (Unknown ideal weight.). Liver Function Tests: Recent Labs  Lab 08/22/18 2104  AST 24  ALT 17  ALKPHOS 69  BILITOT 0.8  PROT 6.1*  ALBUMIN 3.9   No results for input(s): LIPASE, AMYLASE in the last 168 hours. No results for input(s): AMMONIA in the last 168 hours. Coagulation Profile: No results for input(s): INR, PROTIME in the last 168 hours. Cardiac Enzymes: Recent Labs  Lab 08/22/18 2123  CKTOTAL 133   BNP (last 3 results) No results for input(s): PROBNP in the last 8760 hours. HbA1C: No results for input(s): HGBA1C in the last 72 hours. CBG: No results for input(s): GLUCAP in the last 168 hours. Lipid Profile: No results for input(s): CHOL, HDL, LDLCALC, TRIG, CHOLHDL, LDLDIRECT in the last 72 hours. Thyroid Function Tests: No results for input(s): TSH, T4TOTAL, FREET4, T3FREE, THYROIDAB in the last 72 hours. Anemia Panel: No results for input(s): VITAMINB12, FOLATE, FERRITIN, TIBC, IRON, RETICCTPCT in the last 72 hours. Urine analysis:    Component Value Date/Time   COLORURINE YELLOW 05/05/2016 2100   APPEARANCEUR CLEAR 05/05/2016 2100   LABSPEC 1.015 05/05/2016 2100   LABSPEC 1.025 11/08/2013 1431   PHURINE 6.0 05/05/2016 2100   GLUCOSEU NEGATIVE 05/05/2016 2100   GLUCOSEU Negative 11/08/2013 1431   HGBUR SMALL  (A) 05/05/2016 2100   BILIRUBINUR NEGATIVE 05/05/2016 2100   BILIRUBINUR negative 01/30/2016 1526   BILIRUBINUR neg 12/10/2014 1256   BILIRUBINUR Negative 11/08/2013 [place] 05/05/2016 2100   PROTEINUR NEGATIVE 05/05/2016 2100   UROBILINOGEN 0.2  01/30/2016 1526   UROBILINOGEN 0.2 11/08/2013 1431   NITRITE NEGATIVE 05/05/2016 2100   LEUKOCYTESUR TRACE (A) 05/05/2016 2100   LEUKOCYTESUR Small 11/08/2013 1431   Sepsis Labs: @LABRCNTIP (procalcitonin:4,lacticidven:4) )No results found for this or any previous visit (from the past 240 hour(s)).   Radiological Exams on Admission: No results found.   Assessment/Plan Principal Problem:   Cellulitis of upper extremity Active Problems:   Hypothyroidism   Malignant neoplasm of lower-outer quadrant of right breast of female, estrogen receptor positive (HCC)   CLL (chronic lymphocytic leukemia) (HCC)   Cellulitis of left upper extremity    1. Possible cellulitis of the left upper extremity -patient has been placed on empiric antibiotics.  Follow cultures.  Since there is concern for thrombophlebitis I have ordered Dopplers to rule out any DVT. 2. History of CML with worsening WBC count and now also thrombocytopenia -follow CBC with differential.  Please discuss with Dr. Jana Hakim patient's oncologist. 3. History of breast cancer in remission. 4. Hypothyroidism on Synthroid. 5. Anemia appears to be chronic. 6. Depression anxiety on Rexulti and Xanax.   DVT prophylaxis: Lovenox. Code Status: Full code. Family Communication: Discussed with patient. Disposition Plan: Home. Consults called: ER physician discussed with oncologist. Admission status: Observation.   Rise Patience MD [facility] Pager [phone].  If 7PM-7AM, please contact night-coverage [email or web address] Password [email or web address]  08/22/2018, 11:30 PM

## 2018-08-22 NOTE — ED Notes (Signed)
One set of blood cultures obtained °

## 2018-08-22 NOTE — ED Triage Notes (Signed)
Pt c/o knot in left upper arm and redness for couple days. Denies injuries to arm. Was sent from UC to rule out blood clot.

## 2018-08-23 ENCOUNTER — Observation Stay (HOSPITAL_BASED_OUTPATIENT_CLINIC_OR_DEPARTMENT_OTHER): Payer: Medicare Other

## 2018-08-23 DIAGNOSIS — M7989 Other specified soft tissue disorders: Secondary | ICD-10-CM | POA: Diagnosis not present

## 2018-08-23 DIAGNOSIS — I82612 Acute embolism and thrombosis of superficial veins of left upper extremity: Secondary | ICD-10-CM | POA: Diagnosis not present

## 2018-08-23 DIAGNOSIS — R609 Edema, unspecified: Secondary | ICD-10-CM

## 2018-08-23 DIAGNOSIS — I808 Phlebitis and thrombophlebitis of other sites: Secondary | ICD-10-CM

## 2018-08-23 DIAGNOSIS — I809 Phlebitis and thrombophlebitis of unspecified site: Secondary | ICD-10-CM | POA: Diagnosis present

## 2018-08-23 DIAGNOSIS — C9110 Chronic lymphocytic leukemia of B-cell type not having achieved remission: Secondary | ICD-10-CM | POA: Diagnosis not present

## 2018-08-23 LAB — CBC
HCT: 35.5 % — ABNORMAL LOW (ref 36.0–46.0)
HEMOGLOBIN: 10.5 g/dL — AB (ref 12.0–15.0)
MCH: 28.3 pg (ref 26.0–34.0)
MCHC: 29.6 g/dL — ABNORMAL LOW (ref 30.0–36.0)
MCV: 95.7 fL (ref 80.0–100.0)
Platelets: 137 10*3/uL — ABNORMAL LOW (ref 150–400)
RBC: 3.71 MIL/uL — AB (ref 3.87–5.11)
RDW: 14.3 % (ref 11.5–15.5)
WBC: 47.6 10*3/uL — ABNORMAL HIGH (ref 4.0–10.5)
nRBC: 0 % (ref 0.0–0.2)

## 2018-08-23 LAB — CBC WITH DIFFERENTIAL/PLATELET
ABS IMMATURE GRANULOCYTES: 0.25 10*3/uL — AB (ref 0.00–0.07)
Basophils Absolute: 0.2 10*3/uL — ABNORMAL HIGH (ref 0.0–0.1)
Basophils Relative: 0 %
Eosinophils Absolute: 0.5 10*3/uL (ref 0.0–0.5)
Eosinophils Relative: 1 %
HCT: 34.7 % — ABNORMAL LOW (ref 36.0–46.0)
Hemoglobin: 10.6 g/dL — ABNORMAL LOW (ref 12.0–15.0)
Immature Granulocytes: 1 %
Lymphocytes Relative: 78 %
Lymphs Abs: 38.9 10*3/uL — ABNORMAL HIGH (ref 0.7–4.0)
MCH: 28.6 pg (ref 26.0–34.0)
MCHC: 30.5 g/dL (ref 30.0–36.0)
MCV: 93.8 fL (ref 80.0–100.0)
Monocytes Absolute: 4 10*3/uL — ABNORMAL HIGH (ref 0.1–1.0)
Monocytes Relative: 8 %
Neutro Abs: 5.9 10*3/uL (ref 1.7–7.7)
Neutrophils Relative %: 12 %
Platelets: 135 10*3/uL — ABNORMAL LOW (ref 150–400)
RBC: 3.7 MIL/uL — AB (ref 3.87–5.11)
RDW: 14.4 % (ref 11.5–15.5)
WBC: 49.7 10*3/uL — AB (ref 4.0–10.5)
nRBC: 0 % (ref 0.0–0.2)

## 2018-08-23 LAB — BASIC METABOLIC PANEL
ANION GAP: 9 (ref 5–15)
BUN: 14 mg/dL (ref 8–23)
CO2: 22 mmol/L (ref 22–32)
Calcium: 8.7 mg/dL — ABNORMAL LOW (ref 8.9–10.3)
Chloride: 109 mmol/L (ref 98–111)
Creatinine, Ser: 0.96 mg/dL (ref 0.44–1.00)
GFR calc Af Amer: >60 mL/min (ref >60)
GFR calc non Af Amer: 59 mL/min — ABNORMAL LOW (ref >60)
GLUCOSE: 94 mg/dL (ref 70–99)
Potassium: 4.4 mmol/L (ref 3.5–5.1)
Sodium: 140 mmol/L (ref 135–145)

## 2018-08-23 LAB — CREATININE, SERUM
Creatinine, Ser: 0.97 mg/dL (ref 0.44–1.00)
GFR calc Af Amer: >60 mL/min (ref >60)
GFR calc non Af Amer: 58 mL/min — ABNORMAL LOW (ref >60)

## 2018-08-23 LAB — PATHOLOGIST SMEAR REVIEW

## 2018-08-23 MED ORDER — ENOXAPARIN SODIUM 100 MG/ML ~~LOC~~ SOLN
95.0000 mg | Freq: Once | SUBCUTANEOUS | Status: AC
  Administered 2018-08-23: 95 mg via SUBCUTANEOUS
  Filled 2018-08-23: qty 0.95

## 2018-08-23 MED ORDER — SODIUM CHLORIDE 0.9 % IV SOLN
1.0000 g | Freq: Three times a day (TID) | INTRAVENOUS | Status: DC
  Administered 2018-08-23: 1 g via INTRAVENOUS
  Filled 2018-08-23 (×2): qty 1

## 2018-08-23 MED ORDER — VANCOMYCIN HCL 10 G IV SOLR
1250.0000 mg | INTRAVENOUS | Status: DC
  Filled 2018-08-23: qty 1250

## 2018-08-23 NOTE — Progress Notes (Signed)
Brief pharmacy anti-coagulation note:  Pharmacy consulted to dose a one time only enoxaparin 1.5mg /kg dose for likely superficial thrombophlebitis  Wt 91.6kg Scr 0.97, CrCl ~ 57.11mls/min  Plan:  Enoxaparin 1.5mg /kg=135mg  Pt received 40mg  enoxaparin at 1015 today Give additional 95mg  enoxaparin SQ x 1  Dolly Rias RPh 08/23/2018, 10:44 AM Pager [phone]

## 2018-08-23 NOTE — ED Notes (Signed)
ED TO INPATIENT HANDOFF REPORT  Name/Age/Gender [name] [name] 72 y.o. female  Code Status    Code Status Orders  (From admission, onward)         Start     Ordered   08/22/18 2329  Full code  Continuous     08/22/18 2329        Code Status History    Date Active Date Inactive Code Status Order ID Comments User Context   04/06/2017 1224 04/07/2017 1921 Full Code [id number]  Earnie Larsson, MD Inpatient   04/25/2016 1820 05/02/2016 1804 Full Code [id number]  Earnie Larsson, MD Inpatient   02/05/2015 1644 02/08/2015 1449 Full Code [id number]  Raynelle Bring, MD Inpatient   07/28/2013 1617 07/30/2013 1532 Full Code [id number]  Donnie Mesa, MD Inpatient   06/05/2013 0138 06/05/2013 2203 Full Code [id number]  Hilton Sinclair, MD Inpatient      Home/SNF/Other Home  Chief Complaint Blood Clot in Left Arm   Level of Care/Admitting Diagnosis ED Disposition    ED Disposition Condition [place]: [facility] [[id number]]  Level of Care: Med-Surg [16]  Diagnosis: Cellulitis of left upper extremity [948546]  Admitting Physician: Rise Patience [phone]  Attending Physician: Rise Patience [phone]  PT Class (Do Not Modify): Observation [104]  PT Acc Code (Do Not Modify): Observation [10022]       Medical History Past Medical History:  Diagnosis Date  . Alopecia   . Anxiety   . Arthritis    "spine" (07/28/2013)  . Breast cancer ([place]) 1997; 2014   right  . CAP (community acquired pneumonia)    admission 06-04-2013, failed outpatient  . Chronic back pain    "lower back and upper neck" (07/28/2013)  . CLL (chronic lymphocytic leukemia) ([place]) 05/2013  . DDD (degenerative disc disease)   . 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 ear   . Depression   . Diverticulosis   . Elevated LFTs   . Fibromyalgia 1978  . GERD (gastroesophageal reflux disease)   . Hematuria   . History of syncope    episode 2008--  no recurrence since  . HLD (hyperlipidemia)   .  Hypothyroidism   . Kidney stones 2014  . Plantar fasciitis   . Ruptured disk    one in neck and two in back  . S/P chemotherapy, time since greater than 12 weeks   . Sinus tachycardia    mild resting  . Skin cancer   . Stool incontinence    "at times recently"   . Urgency of urination   . Wears glasses     Allergies Allergies  Allergen Reactions  . Lasix [Furosemide] Nausea And Vomiting and Other (See Comments)    HEADACHE  . Lithium Other (See Comments)    Dizziness, "cause me to fall"  . Sulfa Antibiotics Hives  . Trazodone And Nefazodone Other (See Comments)    insomnia  . Lyrica [Pregabalin] Diarrhea, Nausea Only and Rash    IV Location/Drains/Wounds Patient Lines/Drains/Airways Status   Active Line/Drains/Airways    Name:   Placement date:   Placement time:   Site:   Days:   Peripheral IV 08/22/18 Left Forearm   08/22/18    2328    Forearm   1   Ureteral Drain/Stent Right ureter 6 Fr.   06/17/13    1413    Right ureter   1893   Ureteral Drain/Stent Left ureter 6 Fr.   06/17/13    1417  Left ureter   1893   Incision (Closed) 04/25/16 Back Left   04/25/16    1336     850   Incision (Closed) 04/25/16 Back Other (Comment)   04/25/16    1519     850   Incision (Closed) 04/06/17 Back   04/06/17    1039     504   Incision - 5 Ports Abdomen 1: Superior;Umbilicus 2: Superior;Lateral;Right 3: Right;Umbilicus;Lateral 4: Right;Mid;Lower 5: Lateral;Lower   02/05/15    1134     1295          Labs/Imaging Results for orders placed or performed during the hospital encounter of 08/22/18 (from the past 48 hour(s))  CBC with Differential/Platelet     Status: Abnormal   Collection Time: 08/22/18  9:04 PM  Result Value Ref Range   WBC 57.1 (HH) 4.0 - 10.5 K/uL    Comment: REPEATED TO VERIFY WHITE COUNT CONFIRMED ON SMEAR THIS CRITICAL RESULT HAS VERIFIED AND BEEN CALLED TO A,WOOD BY AISHA MOHAMED ON 12 29 2019 AT 2238, AND HAS BEEN READ BACK.     RBC 3.86 (L) 3.87 - 5.11  MIL/uL   Hemoglobin 11.0 (L) 12.0 - 15.0 g/dL   HCT 36.8 36.0 - 46.0 %   MCV 95.3 80.0 - 100.0 fL   MCH 28.5 26.0 - 34.0 pg   MCHC 29.9 (L) 30.0 - 36.0 g/dL   RDW 14.5 11.5 - 15.5 %   Platelets 148 (L) 150 - 400 K/uL   nRBC 0.0 0.0 - 0.2 %   Neutrophils Relative % 8 %   Neutro Abs 4.5 1.7 - 7.7 K/uL   Lymphocytes Relative 83 %   Lymphs Abs 47.1 (H) 0.7 - 4.0 K/uL   Monocytes Relative 8 %   Monocytes Absolute 4.6 (H) 0.1 - 1.0 K/uL   Eosinophils Relative 1 %   Eosinophils Absolute 0.6 (H) 0.0 - 0.5 K/uL   Basophils Relative 0 %   Basophils Absolute 0.1 0.0 - 0.1 K/uL   WBC Morphology ABSOLUTE LYMPHOCYTOSIS     Comment: SMUDGE CELLS   Immature Granulocytes 0 %   Abs Immature Granulocytes 0.25 (H) 0.00 - 0.07 K/uL   Reactive, Benign Lymphocytes PRESENT     Comment: Performed at [facility], [place] [address] [place], [place] [address]  Comprehensive metabolic panel     Status: Abnormal   Collection Time: 08/22/18  9:04 PM  Result Value Ref Range   Sodium 141 135 - 145 mmol/L   Potassium 4.0 3.5 - 5.1 mmol/L   Chloride 111 98 - 111 mmol/L   CO2 22 22 - 32 mmol/L   Glucose, Bld 105 (H) 70 - 99 mg/dL   BUN 14 8 - 23 mg/dL   Creatinine, Ser 0.96 0.44 - 1.00 mg/dL   Calcium 8.8 (L) 8.9 - 10.3 mg/dL   Total Protein 6.1 (L) 6.5 - 8.1 g/dL   Albumin 3.9 3.5 - 5.0 g/dL   AST 24 15 - 41 U/L   ALT 17 0 - 44 U/L   Alkaline Phosphatase 69 38 - 126 U/L   Total Bilirubin 0.8 0.3 - 1.2 mg/dL   GFR calc non Af Amer 59 (L) >60 mL/min   GFR calc Af Amer >60 >60 mL/min   Anion gap 8 5 - 15    Comment: Performed at [facility], [place] [address], [place],  [address]  CK     Status: None   Collection Time: 08/22/18  9:23 PM  Result Value Ref Range   Total CK 133 38 - 234 U/L    Comment: Performed at [facility], [place] [address], [place], [place] [address]  I-Stat CG4 Lactic Acid, ED     Status: None   Collection Time: 08/22/18  11:31 PM  Result Value Ref Range   Lactic Acid, Venous 1.38 0.5 - 1.9 mmol/L   No results found.  Pending Labs Unresulted Labs (From admission, onward)    Start     Ordered   08/29/18 0500  Creatinine, serum  (enoxaparin (LOVENOX)    CrCl >/= 30 ml/min)  Weekly,   R    Comments:  while on enoxaparin therapy    08/22/18 2329   08/23/18 6045  Basic metabolic panel  Tomorrow morning,   R     08/22/18 2329   08/23/18 0500  CBC WITH DIFFERENTIAL  Tomorrow morning,   R     08/22/18 2329   08/22/18 2328  CBC  (enoxaparin (LOVENOX)    CrCl >/= 30 ml/min)  Once,   R    Comments:  Baseline for enoxaparin therapy IF NOT ALREADY DRAWN.  Notify MD if PLT < 100 K.    08/22/18 2329   08/22/18 2328  Creatinine, serum  (enoxaparin (LOVENOX)    CrCl >/= 30 ml/min)  Once,   R    Comments:  Baseline for enoxaparin therapy IF NOT ALREADY DRAWN.    08/22/18 2329   08/22/18 2257  Blood culture (routine x 2)  BLOOD CULTURE X 2,   STAT     08/22/18 2256   08/22/18 2104  Pathologist smear review  Once,   R     08/22/18 2104          Vitals/Pain Today's Vitals   08/23/18 0224 08/23/18 0244 08/23/18 0701 08/23/18 0701  BP:  (!) 141/74  139/82  Pulse:  84 90 92  Resp:  18  18  Temp:      TempSrc:      SpO2:  99%  94%  Weight: 91.6 kg     Height:      PainSc:  0-No pain  0-No pain    Isolation Precautions No active isolations  Medications Medications  oxyCODONE-acetaminophen (PERCOCET/ROXICET) 5-325 MG per tablet 1-2 tablet (has no administration in time range)  anastrozole (ARIMIDEX) tablet 1 mg (has no administration in time range)  ALPRAZolam (XANAX) tablet 0.5 mg (0.5 mg Oral Given 08/23/18 0237)  amitriptyline (ELAVIL) tablet 150 mg (150 mg Oral Given 08/23/18 0238)  DULoxetine (CYMBALTA) DR capsule 90 mg (has no administration in time range)  Brexpiprazole TABS 2 mg (has no administration in time range)  levothyroxine (SYNTHROID, LEVOTHROID) tablet 88 mcg (has no administration in  time range)  gabapentin (NEURONTIN) capsule 300 mg (has no administration in time range)  acetaminophen (TYLENOL) tablet 650 mg (has no administration in time range)    Or  acetaminophen (TYLENOL) suppository 650 mg (has no administration in time range)  ondansetron (ZOFRAN) tablet 4 mg (has no administration in time range)    Or  ondansetron (ZOFRAN) injection 4 mg (has no administration in time range)  enoxaparin (LOVENOX) injection 40 mg (has no administration in time range)  0.9 %  sodium chloride infusion ( Intravenous New Bag/Given 08/23/18 0503)  ceFEPIme (MAXIPIME) 1 g in sodium chloride 0.9 % 100 mL IVPB (1 g Intravenous New Bag/Given 08/23/18 0736)  vancomycin (VANCOCIN) 1,250 mg in sodium chloride 0.9 % 250  mL IVPB (has no administration in time range)  vancomycin (VANCOCIN) IVPB 1000 mg/200 mL premix (0 mg Intravenous Stopped 08/23/18 0155)  ceFEPIme (MAXIPIME) 1 g in sodium chloride 0.9 % 100 mL IVPB (0 g Intravenous Stopped 08/23/18 0014)    Mobility walks

## 2018-08-23 NOTE — Discharge Summary (Signed)
Discharge Summary  [name] [name] TMH:[id number] DOB: 1945-11-10  PCP: Deland Pretty, MD  Admit date: 08/22/2018 Discharge date: 08/23/2018   Time spent: < 25 minutes  Admitted From: home Disposition:  home  Recommendations for Outpatient Follow-up:  1. Follow up with PCP in 1 week 2. Oncology will arrange closer follow up     Discharge Diagnoses:  Active Hospital Problems   Diagnosis Date Noted  . Superficial thrombophlebitis 08/23/2018  . CLL (chronic lymphocytic leukemia) ([place]) 07/27/2013  . Malignant neoplasm of lower-outer quadrant of right breast of female, estrogen receptor positive ([place]) 07/26/2013  . Hypothyroidism     Resolved Hospital Problems  No resolved problems to display.    Discharge Condition: Stable   CODE STATUS:FULL   History of present illness:  [name] [name] is a 72 y.o. year old female with medical history significant for CLL who presented on 08/22/2018 with worsening left upper arm pain over 5 days and associated swelling and was found to have acute superficial thrombophlebitis and acute superficial thrombosis. Remaining hospital course addressed in problem based format below:   Hospital Course:  Acute superficial thrombophlebitis/thrombosis of left upper extremity.   Admitted as observation due to concern for infection.  Patient's baseline white count is quite elevated due to her CLL (range WBC 30-40) and on admission it was initially 57, she was empirically started on vancomycin and cefepime.  Her blood cultures remained negative, she exhibited no signs or symptoms of infection her T-max on admission was 99.2 but otherwise remained afebrile.  I discussed her plan of care with her oncologist Dr. Jana Hakim who agreed this is most consistent with superficial thrombophlebitis given palpable cord on exam, minimal overlying erythema and this was confirmed on Doppler studies with confirmed superficial vein thrombosis of left cephalic vein.  She  received one time dose of therapeutic lovenox per oncology recommendations as this can help with inflammatory part but she will not need further anticoagulation.  Empiric antibiotics were discontinued on discharge given low concern for infection and presentation more consistent with superficial thrombophlebitis.  Advised continue supportive care with elevation, warm/cool compress, NSAID PRN use judiciously.   Consultations:  Oncology, Dr. Jana Hakim ( phone curbside)  Procedures/Studies: Upper extremity venous duplex, 08/23/2018.  Superficial thrombus in the left mid upper arm in the region of erythema and "cording".  Findings consistent with superficial vein thrombosis involving the left cephalic vein Discharge Exam: BP 133/63 (BP Location: Left Arm)   Pulse 98   Temp 99.8 F (37.7 C) (Oral)   Resp 16   Ht 5\' 4"  (1.626 m)   Wt 91.6 kg   LMP 08/26/1995   SpO2 96%   BMI 34.67 kg/m   General: Lying in bed, no apparent distress Eyes: EOMI, anicteric ENT: Oral Mucosa clear and moist Cardiovascular: regular rate and rhythm, no murmurs, rubs or gallops, no edema, Respiratory: Normal respiratory effort, lungs clear to auscultation bilaterally Abdomen: soft, non-distended, non-tender, normal bowel sounds Skin: Palpable cord in left upper extremity with overlying erythema, no fluctuance, no drainage, slightly tender to palpation Neurologic: Grossly no focal neuro deficit.Mental status AAOx3, speech normal, Psychiatric:Appropriate affect, and mood   Discharge Instructions You were cared for by a hospitalist during your hospital stay. If you have any questions about your discharge medications or the care you received while you were in the hospital after you are discharged, you can call the unit and asked to speak with the hospitalist on call if the hospitalist that took care of you  is not available. Once you are discharged, your primary care physician will handle any further medical issues.  Please note that NO REFILLS for any discharge medications will be authorized once you are discharged, as it is imperative that you return to your primary care physician (or establish a relationship with a primary care physician if you do not have one) for your aftercare needs so that they can reassess your need for medications and monitor your lab values.  Discharge Instructions    Diet - low sodium heart healthy   Complete by:  As directed    Increase activity slowly   Complete by:  As directed      Allergies as of 08/23/2018      Reactions   Lasix [furosemide] Nausea And Vomiting, Other (See Comments)   HEADACHE   Lithium Other (See Comments)   Dizziness, "cause me to fall"   Sulfa Antibiotics Hives   Trazodone And Nefazodone Other (See Comments)   insomnia   Lyrica [pregabalin] Diarrhea, Nausea Only, Rash      Medication List    TAKE these medications   ALPRAZolam 0.5 MG tablet Commonly known as:  XANAX Take 0.5 mg by mouth 2 (two) times daily.   amitriptyline 150 MG tablet Commonly known as:  ELAVIL Take 150 mg by mouth at bedtime.   anastrozole 1 MG tablet Commonly known as:  ARIMIDEX Take 1 tablet (1 mg total) by mouth daily. What changed:  when to take this   DULoxetine 30 MG capsule Commonly known as:  CYMBALTA Take 90 mg by mouth daily after breakfast.   gabapentin 300 MG capsule Commonly known as:  NEURONTIN Take 300mg s by mouth daily What changed:    how much to take  how to take this  when to take this  additional instructions   levothyroxine 88 MCG tablet Commonly known as:  SYNTHROID, LEVOTHROID Take 88 mcg by mouth daily before breakfast.   omeprazole 40 MG capsule Commonly known as:  PRILOSEC TAKE 1 CAPSULE(40 MG) BY MOUTH EVERY MORNING What changed:  See the new instructions.   oxyCODONE-acetaminophen 5-325 MG tablet Commonly known as:  PERCOCET Take 1-2 tablets by mouth every 4 (four) hours as needed for severe pain.   REXULTI 2 MG  Tabs Generic drug:  Brexpiprazole Take 2 mg by mouth at bedtime.      Allergies  Allergen Reactions  . Lasix [Furosemide] Nausea And Vomiting and Other (See Comments)    HEADACHE  . Lithium Other (See Comments)    Dizziness, "cause me to fall"  . Sulfa Antibiotics Hives  . Trazodone And Nefazodone Other (See Comments)    insomnia  . Lyrica [Pregabalin] Diarrhea, Nausea Only and Rash      The results of significant diagnostics from this hospitalization (including imaging, microbiology, ancillary and laboratory) are listed below for reference.    Significant Diagnostic Studies: Vas [place] Upper Extremity Venous Duplex  Result Date: 08/23/2018 UPPER VENOUS STUDY  Indications: Pain, Swelling, and Erythema Performing Technologist: Toma Copier RVS  Examination Guidelines: A complete evaluation includes B-mode imaging, spectral Doppler, color Doppler, and power Doppler as needed of all accessible portions of each vessel. Bilateral testing is considered an integral part of a complete examination. Limited examinations for reoccurring indications may be performed as noted.  Right Findings: +----------+------------+----------+---------+-----------+-------+ RIGHT     CompressiblePropertiesPhasicitySpontaneousSummary +----------+------------+----------+---------+-----------+-------+ IJV           Full  Yes       Yes            +----------+------------+----------+---------+-----------+-------+ Subclavian                         Yes       Yes            +----------+------------+----------+---------+-----------+-------+  Left Findings: +----------+------------+----------+---------+-----------+---------------------+ LEFT      CompressiblePropertiesPhasicitySpontaneous       Summary        +----------+------------+----------+---------+-----------+---------------------+ IJV           Full                 Yes                                     +----------+------------+----------+---------+-----------+---------------------+ Subclavian                         Yes                                    +----------+------------+----------+---------+-----------+---------------------+ Axillary      Full                 Yes                                    +----------+------------+----------+---------+-----------+---------------------+ Brachial      Full                 Yes                                    +----------+------------+----------+---------+-----------+---------------------+ Radial        Full                 Yes                                    +----------+------------+----------+---------+-----------+---------------------+ Ulnar         Full                 Yes                                    +----------+------------+----------+---------+-----------+---------------------+ Cephalic    Partial                                  Acute in the region                                                      of the mid upper arm  +----------+------------+----------+---------+-----------+---------------------+ Basilic       Full                 Yes       Yes                          +----------+------------+----------+---------+-----------+---------------------+  Summary:There is an area of superficial thrombus noted in the left mid upper arm in the region of the erythrma and " cording".  Right: No evidence of thrombosis in the subclavian.  Left: No evidence of deep vein thrombosis in the upper extremity. No evidence of thrombosis in the subclavian. Findings consistent with acute superficial vein thrombosis involving the left cephalic vein.  *See table(s) above for measurements and observations.    Preliminary     Microbiology: Recent Results (from the past 240 hour(s))  Blood culture (routine x 2)     Status: None (Preliminary result)   Collection Time: 08/22/18 10:57 PM  Result Value Ref Range  Status   Specimen Description   Final    BLOOD LEFT WRIST Performed at [facility] [address], [place], [place] [address]    Special Requests   Final    BOTTLES DRAWN AEROBIC AND ANAEROBIC Blood Culture adequate volume Performed at [facility] [address], [place], [place] [address]    Culture   Final    NO GROWTH < 24 HOURS Performed at [facility] [address] [place], [place] [address]    Report Status PENDING  Incomplete  Blood culture (routine x 2)     Status: None (Preliminary result)   Collection Time: 08/23/18  8:29 AM  Result Value Ref Range Status   Specimen Description   Final    BLOOD LEFT HAND Performed at [facility] [address], [place], [place] [address]    Special Requests   Final    BOTTLES DRAWN AEROBIC ONLY Blood Culture results may not be optimal due to an inadequate volume of blood received in culture bottles Performed at [facility] [address] [place], [place] [address]    Culture   Final    NO GROWTH < 12 HOURS Performed at [facility] [address], [place], [place] [address]    Report Status PENDING  Incomplete     Labs: Basic Metabolic Panel: Recent Labs  Lab 08/22/18 2104 08/23/18 0700 08/23/18 0829  NA 141 140  --   K 4.0 4.4  --   CL 111 109  --   CO2 22 22  --   GLUCOSE 105* 94  --   BUN 14 14  --   CREATININE 0.96 0.96 0.97  CALCIUM 8.8* 8.7*  --    Liver Function Tests: Recent Labs  Lab 08/22/18 2104  AST 24  ALT 17  ALKPHOS 69  BILITOT 0.8  PROT 6.1*  ALBUMIN 3.9   No results for input(s): LIPASE, AMYLASE in the last 168 hours. No results for input(s): AMMONIA in the last 168 hours. CBC: Recent Labs  Lab 08/22/18 2104 08/23/18 0700 08/23/18 0829  WBC 57.1* 49.7* 47.6*  NEUTROABS 4.5 5.9  --   HGB 11.0* 10.6* 10.5*  HCT 36.8 34.7* 35.5*  MCV 95.3 93.8 95.7  PLT 148* 135* 137*   Cardiac  Enzymes: Recent Labs  Lab 08/22/18 2123  CKTOTAL 133   BNP: BNP (last 3 results) No results for input(s): BNP in the last 8760 hours.  ProBNP (last 3 results) No results for input(s): PROBNP in the last 8760 hours.  CBG: No results for input(s): GLUCAP in the last 168 hours.     Signed:  Desiree Hane, MD [facility] 08/23/2018, 3:37 PM

## 2018-08-23 NOTE — Progress Notes (Signed)
Left upper extremity venous duplex completed. Preliminary results - There is no evidence of a DVT. Positive for a region of the upper arm superficial thrombosis of the cephalic vein at the level of the erythema and cording. [name] [name],RVS 08/23/2018, 12:33 PM

## 2018-08-23 NOTE — Progress Notes (Signed)
Pharmacy Antibiotic Note  [name] [name] is a 72 y.o. female admitted on 08/22/2018 with cellulitis.  Pharmacy has been consulted for cefepime and vancomycin dosing.  Plan: Cefepime 1 Gm IV q8h Vancomycin 1 Gm x1 then 1250 mg IV q36h for est AUC = 459 Goal AUC = 400-500 F/u scr/cultures/levels     Temp (24hrs), Avg:99.2 F (37.3 C), Min:99.2 F (37.3 C), Max:99.2 F (37.3 C)  Recent Labs  Lab 08/22/18 2104 08/22/18 2331  WBC 57.1*  --   CREATININE 0.96  --   LATICACIDVEN  --  1.38    CrCl cannot be calculated (Unknown ideal weight.).    Allergies  Allergen Reactions  . Lasix [Furosemide] Nausea And Vomiting and Other (See Comments)    HEADACHE  . Lithium Other (See Comments)    Dizziness, "cause me to fall"  . Sulfa Antibiotics Hives  . Trazodone And Nefazodone Other (See Comments)    insomnia  . Lyrica [Pregabalin] Diarrhea, Nausea Only and Rash    Antimicrobials this admission: 12/29 cefepime >>  12/29 vancomycin >>   Dose adjustments this admission:   Microbiology results:  BCx:   UCx:    Sputum:    MRSA PCR:   Thank you for allowing pharmacy to be a part of this patient's care.  Dorrene German 08/23/2018 12:49 AM

## 2018-08-28 LAB — CULTURE, BLOOD (ROUTINE X 2)
Culture: NO GROWTH
Culture: NO GROWTH
Special Requests: ADEQUATE

## 2018-09-01 DIAGNOSIS — Z6834 Body mass index (BMI) 34.0-34.9, adult: Secondary | ICD-10-CM | POA: Diagnosis not present

## 2018-09-01 DIAGNOSIS — I808 Phlebitis and thrombophlebitis of other sites: Secondary | ICD-10-CM | POA: Diagnosis not present

## 2018-09-01 DIAGNOSIS — M546 Pain in thoracic spine: Secondary | ICD-10-CM | POA: Diagnosis not present

## 2018-09-01 DIAGNOSIS — M48061 Spinal stenosis, lumbar region without neurogenic claudication: Secondary | ICD-10-CM | POA: Diagnosis not present

## 2018-09-06 ENCOUNTER — Other Ambulatory Visit: Payer: Self-pay | Admitting: Oncology

## 2018-09-06 DIAGNOSIS — C50511 Malignant neoplasm of lower-outer quadrant of right female breast: Secondary | ICD-10-CM

## 2018-09-06 DIAGNOSIS — Z170 Estrogen receptor positive status [ER+]: Principal | ICD-10-CM

## 2018-09-07 ENCOUNTER — Other Ambulatory Visit: Payer: Self-pay | Admitting: Oncology

## 2018-09-07 ENCOUNTER — Telehealth: Payer: Self-pay | Admitting: Oncology

## 2018-09-07 NOTE — Telephone Encounter (Signed)
Spoke with patient re lab 1/17 and GM 1/24. Per GM lab should be prior to 1/24 f/u instead of 1/27.

## 2018-09-10 ENCOUNTER — Inpatient Hospital Stay: Payer: Medicare Other | Attending: Oncology

## 2018-09-10 ENCOUNTER — Other Ambulatory Visit: Payer: Medicare Other

## 2018-09-10 DIAGNOSIS — G8929 Other chronic pain: Secondary | ICD-10-CM | POA: Diagnosis not present

## 2018-09-10 DIAGNOSIS — C50511 Malignant neoplasm of lower-outer quadrant of right female breast: Secondary | ICD-10-CM | POA: Insufficient documentation

## 2018-09-10 DIAGNOSIS — D649 Anemia, unspecified: Secondary | ICD-10-CM | POA: Insufficient documentation

## 2018-09-10 DIAGNOSIS — Z923 Personal history of irradiation: Secondary | ICD-10-CM | POA: Diagnosis not present

## 2018-09-10 DIAGNOSIS — D696 Thrombocytopenia, unspecified: Secondary | ICD-10-CM | POA: Insufficient documentation

## 2018-09-10 DIAGNOSIS — Z79811 Long term (current) use of aromatase inhibitors: Secondary | ICD-10-CM | POA: Insufficient documentation

## 2018-09-10 DIAGNOSIS — Z170 Estrogen receptor positive status [ER+]: Secondary | ICD-10-CM | POA: Diagnosis not present

## 2018-09-10 DIAGNOSIS — Z9221 Personal history of antineoplastic chemotherapy: Secondary | ICD-10-CM | POA: Diagnosis not present

## 2018-09-10 DIAGNOSIS — I82612 Acute embolism and thrombosis of superficial veins of left upper extremity: Secondary | ICD-10-CM | POA: Insufficient documentation

## 2018-09-10 DIAGNOSIS — S79921A Unspecified injury of right thigh, initial encounter: Secondary | ICD-10-CM | POA: Diagnosis not present

## 2018-09-10 DIAGNOSIS — C9110 Chronic lymphocytic leukemia of B-cell type not having achieved remission: Secondary | ICD-10-CM

## 2018-09-10 DIAGNOSIS — M797 Fibromyalgia: Secondary | ICD-10-CM | POA: Diagnosis not present

## 2018-09-10 DIAGNOSIS — M79651 Pain in right thigh: Secondary | ICD-10-CM | POA: Diagnosis not present

## 2018-09-10 DIAGNOSIS — Z9011 Acquired absence of right breast and nipple: Secondary | ICD-10-CM | POA: Diagnosis not present

## 2018-09-10 DIAGNOSIS — M25551 Pain in right hip: Secondary | ICD-10-CM | POA: Diagnosis not present

## 2018-09-10 DIAGNOSIS — M159 Polyosteoarthritis, unspecified: Secondary | ICD-10-CM | POA: Diagnosis not present

## 2018-09-10 LAB — CBC WITH DIFFERENTIAL/PLATELET
Abs Immature Granulocytes: 0 10*3/uL (ref 0.00–0.07)
Band Neutrophils: 1 %
Basophils Absolute: 0 10*3/uL (ref 0.0–0.1)
Basophils Relative: 0 %
EOS PCT: 0 %
Eosinophils Absolute: 0 10*3/uL (ref 0.0–0.5)
HCT: 34.5 % — ABNORMAL LOW (ref 36.0–46.0)
Hemoglobin: 10.4 g/dL — ABNORMAL LOW (ref 12.0–15.0)
LYMPHS PCT: 92 %
Lymphs Abs: 50.7 10*3/uL — ABNORMAL HIGH (ref 0.7–4.0)
MCH: 27.7 pg (ref 26.0–34.0)
MCHC: 30.1 g/dL (ref 30.0–36.0)
MCV: 91.8 fL (ref 80.0–100.0)
Monocytes Absolute: 0 10*3/uL — ABNORMAL LOW (ref 0.1–1.0)
Monocytes Relative: 0 %
Neutro Abs: 4.4 10*3/uL (ref 1.7–17.7)
Neutrophils Relative %: 7 %
Platelets: 223 10*3/uL (ref 150–400)
RBC: 3.76 MIL/uL — ABNORMAL LOW (ref 3.87–5.11)
RDW: 14.4 % (ref 11.5–15.5)
WBC: 55.1 10*3/uL (ref 4.0–10.5)
nRBC: 0 % (ref 0.0–0.2)

## 2018-09-10 LAB — LACTATE DEHYDROGENASE: LDH: 270 U/L — ABNORMAL HIGH (ref 98–192)

## 2018-09-11 LAB — LUPUS ANTICOAGULANT PANEL
DRVVT: 39.1 s (ref 0.0–47.0)
PTT Lupus Anticoagulant: 30 s (ref 0.0–51.9)

## 2018-09-12 LAB — BETA 2 MICROGLOBULIN, SERUM: Beta-2 Microglobulin: 3 mg/L — ABNORMAL HIGH (ref 0.6–2.4)

## 2018-09-16 NOTE — Progress Notes (Signed)
ID: [name] [name] OB: [birth year]  MR#: [id number]  OEH#:[id number]  Patient Care Team: [name] Pretty, MD as PCP - General (Internal Medicine) [name], [name] Dad, MD as Consulting Physician (Oncology) [name] Banister, MD as Attending Physician (Gastroenterology) [name] Salon, MD as Consulting Physician (Gynecology) [name] Mesa, MD as Consulting Physician (General Surgery) [name] Chapel, NP as Nurse Practitioner   CHIEF COMPLAINT: Estrogen receptor positive Right Breast Cancer; chronic lymphoid leukemia  CURRENT TREATMENT: Completing 5 years of anastrozole   BREAST CANCER HISTORY: From the prior summary:  [name] [name] has a history of breast cancer dating back to 1997, [name] [name] Dr. Annabell [name] performed a right lumpectomy and axillary lymph node dissection for what according to the patient and her family was a stage I invasive ductal breast cancer. She received radiation adjuvantly and then took tamoxifen for 5 years  In February of 2014 she had a normal screening mammography, but in November 2014 she felt a "hard lump" in her right breast. She brought this to the attention of her urologist, Dr. Izora [name], and he set her up for bilateral diagnostic mammography with mammography at [name] 07/13/2013. This showed her breast density to be category B. There was no mammographic abnormality noted but ultrasonography showed a 1.4 cm irregularly shaped solid mass in the right breast at the 8:00 position. This was biopsied the same day, and showed ([id number]) an invasive ductal carcinoma, grade 2, estrogen receptor 100% positive, progesterone receptor 13% positive, with an MIB-1 of 33% and HER-2 amplification with a HER-2: CEP 17 ratio of 3.19, and a HER-2 copy number percent all of 4.15.  The patient's subsequent history is as detailed below   INTERVAL HISTORY: [name] [name] returns today for follow-up and treatment of her estrogen receptor positive breast cancer. She is accompanied by her husband.  She  continues on anastrozole.   [name] [name]'s last bone density screening on 02/21/2016, showed a T-score of -1.0, which is considered normal. She is behind on her biennial exam.  Her last mammography was on 02/23/2018 at [name] showing Breast Density Category B. There was no mammographic evidence for malignancy.   Since her last visit here, she underwent a CT of the thoracic spine without contrast on 05/11/2018 showing chronic T9 pedicle screw loosening with continued vacuum disc at T9-T10 and new mild vacuum disc at T10-T11. No arthrodesis is evident T9 through T12. Evidence of developing posterior element arthrodesis at T12-L1. T10 through L1 pedicle screws without evidence of loosening. No thoracic spinal stenosis suspected. Lower cervical and upper thoracic spine degeneration with moderate to severe neural foraminal stenosis related to disc osteophyte complex and facet hypertrophy.   She also underwent a Lumbar Spine CT on the same day showing sacral hardware revision since 2018 with new bilateral S1 and SI joint screws. Subsequent new developing bilateral posterior element arthrodesis at L5-S1. No lumbar interbody arthrodesis, but no hardware loosening Identified. Early posterior element arthrodesis is suspected at L1-L2 and L4-L5 and improved at both levels since 2018. Scant if any posterior element arthrodesis at L2-L3 and L3-L4 appears stable.  [name] [name] states that she recently had additional Xrays performed by [name] [name] after a fall, but those records are not available to [place] at the time of the visit.    REVIEW OF SYSTEMS: [name] [name] had a blood clot in her left arm in a superficial vein. She fell on her back porch recently and has severe pain in her right hip and thigh. She recently had a urinary tract infection, which was  treated with an antibiotic. She does not have constipation from her pain medicine. The patient denies unusual headaches, visual changes, nausea, vomiting, or dizziness.  There has been no unusual cough, phlegm production, or pleurisy. This been no change in bowel habits. The patient denies unexplained fatigue or unexplained weight loss, bleeding, rash, or fever. A detailed review of systems was otherwise noncontributory.    PAST MEDICAL HISTORY: Past Medical History:  Diagnosis Date  . Alopecia   . Anxiety   . Arthritis    "spine" (07/28/2013)  . Breast cancer ([place]) 1997; 2014   right  . CAP (community acquired pneumonia)    admission 06-04-2013, failed outpatient  . Chronic back pain    "lower back and upper neck" (07/28/2013)  . CLL (chronic lymphocytic leukemia) ([place]) 05/2013  . DDD (degenerative disc disease)   . 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 ear   . Depression   . Diverticulosis   . Elevated LFTs   . Fibromyalgia 1978  . GERD (gastroesophageal reflux disease)   . Hematuria   . History of syncope    episode 2008--  no recurrence since  . HLD (hyperlipidemia)   . Hypothyroidism   . Kidney stones 2014  . Plantar fasciitis   . Ruptured disk    one in neck and two in back  . S/P chemotherapy, time since greater than 12 weeks   . Sinus tachycardia    mild resting  . Skin cancer   . Stool incontinence    "at times recently"   . Urgency of urination   . Wears glasses     PAST SURGICAL HISTORY: Past Surgical History:  Procedure Laterality Date  . ABDOMINAL HYSTERECTOMY    . ANTERIOR LATERAL LUMBAR FUSION 4 LEVELS Left 04/25/2016   Procedure: LEFT LUMBAR ONE-TWO, LUMBAR TWO-THREE, LUMBAR THREE-FOUR, LUMBAR FOUR-FIVE ANTERIOR LATERAL LUMBAR FUSION;  Surgeon: [name] Larsson, MD;  Location: [facility];  Service: Neurosurgery;  Laterality: Left;  . APPENDECTOMY  1997  . APPLICATION OF ROBOTIC ASSISTANCE FOR SPINAL PROCEDURE N/A 04/06/2017   Procedure: APPLICATION OF ROBOTIC ASSISTANCE FOR SPINAL PROCEDURE;  Surgeon: [name] Larsson, MD;  Location: [facility];  Service: Neurosurgery;  Laterality: N/A;  . BREAST BIOPSY Right 2014  . BREAST LUMPECTOMY WITH AXILLARY  LYMPH NODE DISSECTION Right 1997  . CYSTOSCOPY WITH RETROGRADE PYELOGRAM, URETEROSCOPY AND STENT PLACEMENT Bilateral 06/17/2013   Procedure: CYSTOSCOPY WITH RETROGRADE PYELOGRAM, URETEROSCOPY AND LEFT DOUBLE  J STENT PLACEMENT RIGHT URETERAL HOLMIIUM LASER AND DOUBLE J STENT ;  Surgeon: Hanley Ben, MD;  Location: [facility];  Service: Urology;  Laterality: Bilateral;  . CYSTOSCOPY WITH URETEROSCOPY, STONE BASKETRY AND STENT PLACEMENT Bilateral 2014  . HOLMIUM LASER APPLICATION Bilateral 09/60/4540   Procedure: HOLMIUM LASER APPLICATION;  Surgeon: Hanley Ben, MD;  Location: [facility];  Service: Urology;  Laterality: Bilateral;  . LITHOTRIPSY Left ~ 06/2013  . LUMBAR EPIDURAL INJECTION     has had 7 injections  . MASTECTOMY COMPLETE / SIMPLE Right 07/28/2013  . MASTECTOMY W/ NODES PARTIAL Right 1997  . PORT-A-CATH REMOVAL Left 10/03/2014   Procedure: REMOVAL PORT-A-CATH;  Surgeon: [name] Mesa, MD;  Location: [facility];  Service: General;  Laterality: Left;  . PORTACATH PLACEMENT Left 07/28/2013   Procedure: ATTEMPTED INSERTION PORT-A-CATH;  Surgeon: Imogene Burn. Georgette Dover, MD;  Location: [facility];  Service: General;  Laterality: Left;  . POSTERIOR LUMBAR FUSION 4 LEVEL N/A 04/25/2016   Procedure: LUMBAR FIVE-SACRAL ONE POSTERIOR LUMBAR INTERBODY FUSION, THORACIC NINE-SACRAL ONE  POSTERIOR LATERAL ARTHRODESIS WITH PEDICLE SCREWS;  Surgeon: [name] Larsson, MD;  Location: [facility];  Service: Neurosurgery;  Laterality: N/A;  . RETINAL DETACHMENT SURGERY Right 2013  . ROBOTIC ASSITED PARTIAL NEPHRECTOMY Right 02/05/2015   Procedure: ROBOTIC ASSITED PARTIAL NEPHRECTOMY;  Surgeon: Raynelle Bring, MD;  Location: [facility];  Service: Urology;  Laterality: Right;  . SIMPLE MASTECTOMY WITH AXILLARY SENTINEL NODE BIOPSY Right 07/28/2013   Procedure: RIGHT TOTAL  MASTECTOMY;  Surgeon: Imogene Burn. Georgette Dover, MD;  Location: [facility];  Service: General;  Laterality: Right;  .  TONSILLECTOMY  AGE 31  . TOTAL ABDOMINAL HYSTERECTOMY W/ BILATERAL SALPINGOOPHORECTOMY  1997  . TRANSTHORACIC ECHOCARDIOGRAM  05-11-2007  dr Ron Parker   normal lvf/  ef 65%    FAMILY HISTORY Family History  Problem Relation Age of Onset  . Heart disease Maternal Grandfather   . Diabetes Maternal Grandfather   . Colon cancer Maternal Aunt 79  . Brain cancer Paternal Grandmother        dx in 31s  . Dementia Mother   . Diabetes Mother   . Osteoporosis Mother   . Diabetes Maternal Aunt   . Prostate cancer Father 9  . Bipolar disorder Maternal Aunt   . Stroke Maternal Aunt   . Stomach cancer Paternal Uncle        dx in late 76s   the patient's mother died at the age of 47. The patient's father is alive at age 43. She had no brothers or sisters. Her father has a history of prostate cancer. There is no history of breast or ovarian cancer in the family. One maternal first cousin has a history of Hodgkin's lymphoma.  GYNECOLOGIC HISTORY:  Menarche age 36, first live birth age 23. She is GX P1. She underwent total abdominal hysterectomy and bilateral salpingo-oophorectomy in 1997 she did not take hormone replacement.  SOCIAL HISTORY: (Updated  11/15/2013)  She is a homemaker. Her husband Arnette Norris farms approximately 500 acres, including quite a bit of tobacco. 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 The patient has no grandchildren. She has two "grand cats". She attends a local united church of [place]: Not in place   HEALTH MAINTENANCE:  (Updated   11/15/2013 ) Social History   Tobacco Use  . Smoking status: Never Smoker  . Smokeless tobacco: Never Used  Substance Use Topics  . Alcohol use: No    Comment: 07/28/2013 "no alcohol since 1994; never had problem w/it"  . Drug use: No     Colonoscopy: 2009/ [facility]  PAP: Status post hysterectomy  Bone density: May 10/14/2009 at [name] was  normal  Lipid panel:  Not on file   Allergies  Allergen Reactions  . Lasix [Furosemide] Nausea And Vomiting and Other (See Comments)    HEADACHE  . Lithium Other (See Comments)    Dizziness, "cause me to fall"  . Sulfa Antibiotics Hives  . Trazodone And Nefazodone Other (See Comments)    insomnia  . Lyrica [Pregabalin] Diarrhea, Nausea Only and Rash    Current Outpatient Medications  Medication Sig Dispense Refill  . ALPRAZolam (XANAX) 0.5 MG tablet Take 0.5 mg by mouth 2 (two) times daily.     Marland Kitchen amitriptyline (ELAVIL) 150 MG tablet Take 150 mg by mouth at bedtime.    Marland Kitchen anastrozole (ARIMIDEX) 1 MG tablet Take 1 tablet (1 mg total) by mouth daily. (Patient taking differently: Take 1 mg by  mouth daily after breakfast. ) 90 tablet 4  . DULoxetine (CYMBALTA) 30 MG capsule Take 90 mg by mouth daily after breakfast.     . gabapentin (NEURONTIN) 300 MG capsule Take 334ms by mouth daily (Patient taking differently: Take 300 mg by mouth at bedtime. ) 90 capsule 4  . levothyroxine (SYNTHROID, LEVOTHROID) 88 MCG tablet Take 88 mcg by mouth daily before breakfast.   4  . omeprazole (PRILOSEC) 40 MG capsule TAKE 1 CAPSULE(40 MG) BY MOUTH EVERY MORNING (Patient taking differently: Take 40 mg by mouth daily after breakfast. ) 90 capsule 0  . oxyCODONE-acetaminophen (PERCOCET) 5-325 MG tablet Take 1-2 tablets by mouth every 4 (four) hours as needed for severe pain. 60 tablet 0  . REXULTI 2 MG TABS Take 2 mg by mouth at bedtime.   1   No current facility-administered medications for this visit.     OBJECTIVE: 73year old white woman who appears stated age V47   09/17/18 1528  BP: (!) 119/58  Pulse: 85  Resp: 14  Temp: 98.6 F (37 C)  SpO2: 95%     Body mass index is 34.24 kg/m.    ECOG FS:2 - Symptomatic, <50% confined to bed Filed Weights   09/17/18 1528  Weight: 199 lb 8 oz (90.5 kg)   Sclerae unicteric, EOMs intact No cervical or supraclavicular adenopathy, no axillary  adenopathy Lungs no rales or rhonchi Heart regular rate and rhythm Abd soft, nontender, positive bowel sounds MSK no focal spinal tenderness, no upper extremity lymphedema Neuro: nonfocal, well oriented, appropriate affect Breasts: That is post right mastectomy.  There is no evidence of chest wall recurrence.  The left breast is unremarkable.  LAB RESULTS:  Lab Results  Component Value Date   WBC 55.1 (HH) 09/10/2018   NEUTROABS 4.4 09/10/2018   HGB 10.4 (L) 09/10/2018   HCT 34.5 (L) 09/10/2018   MCV 91.8 09/10/2018   PLT 223 09/10/2018      Chemistry      Component Value Date/Time   NA 140 08/23/2018 0700   NA 140 03/25/2017 1300   K 4.4 08/23/2018 0700   K 4.2 03/25/2017 1300   CL 109 08/23/2018 0700   CO2 22 08/23/2018 0700   CO2 24 03/25/2017 1300   BUN 14 08/23/2018 0700   BUN 17.9 03/25/2017 1300   CREATININE 0.97 08/23/2018 0829   CREATININE 1.10 (H) 06/29/2018 1258   CREATININE 1.1 03/25/2017 1300      Component Value Date/Time   CALCIUM 8.7 (L) 08/23/2018 0700   CALCIUM 9.4 03/25/2017 1300   ALKPHOS 69 08/22/2018 2104   ALKPHOS 102 03/25/2017 1300   AST 24 08/22/2018 2104   AST 21 06/29/2018 1258   AST 41 (H) 03/25/2017 1300   ALT 17 08/22/2018 2104   ALT 20 06/29/2018 1258   ALT 52 03/25/2017 1300   BILITOT 0.8 08/22/2018 2104   BILITOT <0.2 (L) 06/29/2018 1258   BILITOT 0.36 03/25/2017 1300      STUDIES: Vas UKoreaUpper Extremity Venous Duplex  Result Date: 08/23/2018 UPPER VENOUS STUDY  Indications: Pain, Swelling, and Erythema Performing Technologist: [employer]  Examination Guidelines: A complete evaluation includes B-mode imaging, spectral Doppler, color Doppler, and power Doppler as needed of all accessible portions of each vessel. Bilateral testing is considered an integral part of a complete examination. Limited examinations for reoccurring indications may be performed as noted.  Right Findings:  +----------+------------+----------+---------+-----------+-------+ RIGHT     CompressiblePropertiesPhasicitySpontaneousSummary +----------+------------+----------+---------+-----------+-------+ IJV  Full                 Yes       Yes            +----------+------------+----------+---------+-----------+-------+ Subclavian                         Yes       Yes            +----------+------------+----------+---------+-----------+-------+  Left Findings: +----------+------------+----------+---------+-----------+---------------------+ LEFT      CompressiblePropertiesPhasicitySpontaneous       Summary        +----------+------------+----------+---------+-----------+---------------------+ IJV           Full                 Yes                                    +----------+------------+----------+---------+-----------+---------------------+ Subclavian                         Yes                                    +----------+------------+----------+---------+-----------+---------------------+ Axillary      Full                 Yes                                    +----------+------------+----------+---------+-----------+---------------------+ Brachial      Full                 Yes                                    +----------+------------+----------+---------+-----------+---------------------+ Radial        Full                 Yes                                    +----------+------------+----------+---------+-----------+---------------------+ Ulnar         Full                 Yes                                    +----------+------------+----------+---------+-----------+---------------------+ Cephalic    Partial                                  Acute in the region                                                      of the mid upper arm  +----------+------------+----------+---------+-----------+---------------------+  Basilic       Full  Yes       Yes                          +----------+------------+----------+---------+-----------+---------------------+  Summary:There is an area of superficial thrombus noted in the left mid upper arm in the region of the erythrma and " cording".  Right: No evidence of thrombosis in the subclavian.  Left: No evidence of deep vein thrombosis in the upper extremity. No evidence of thrombosis in the subclavian. Findings consistent with acute superficial vein thrombosis involving the left cephalic vein.  *See table(s) above for measurements and observations.  Diagnosing physician: Deitra Mayo MD Electronically signed by Deitra Mayo MD on 08/23/2018 at 5:12:01 PM.    Final       ASSESSMENT: 73 y.o. BRCA negative [place] woman  (1) status post right breast lumpectomy and axillary lymph node dissection in 1997 for a stage I breast cancer, treated with adjuvant radiation and tamoxifen for 5 years  (2) status post right breast lower outer quadrant biopsy 07/13/2013 for a clinical T1c N0, stage IA invasive ductal carcinoma, grade 2, estrogen receptor 100% positive, progesterone receptor 13% positive, with an MIB-1 of 33%, and HER-2 amplification by CISH with a HER2/CEP 17 ratio of 3.19, and an average HER-2 copy number per cell of 4.15  (3) status post right mastectomy 07/28/2013 for a pT1c pN0, stage IA invasive ductal carcinoma, grade 3, with close but negative margins. Prognostic panel was not repeated  (a) the patient met with Dr. Harlow Mares and has decided against reconstruction  (4) completed weekly paclitaxel x12 12/06/2913, with trastuzumab/ pertuzumab every 3 weeks; pertuzumab was held with third dose on 11/01/2013 due to diarrhea, tried a half dose on cycle 4 again with diarrhea developing  (5) trastuzumab (started 09/20/2013) continued for 1 year, last dose 09/21/2014;  (a) final echocardiogram 09/07/2014 showed an ejection fraction of  55-60%  (6) anastrozole started May 2015; completed April 2020  (a) bone density 12/15/2013 normal  (b) bone density 02/01/2016 at [facility] was normal with a T score of -1.0   OTHER PROBLEMS: (a) History of chronic lymphoid leukemia diagnosed by flow cytometry 06/29/2013, the cells being CD5, CD20 and CD23 positive, CD10 negative.   (1) right renal mass resected on 02/05/15, consisting of atypical lymphoid proliferation composed of monotonous small lymphocytes   (b) Anemia with a normal MCV and normal ferritin-- B-12 and folate normal; stable    PLAN: [name] [name] is now just over 5 years out from definitive surgery for her breast cancer with no evidence of disease recurrence.  This is very favorable.  She will complete 5 years of anastrozole in April.  I have asked her to pay close attention to see if she feels a little bit more energy and a little bit fewer hot flashes some problems with vaginal dryness off the medication  Normally I would release her from follow-up at this point but she does have a chronic lymphoid leukemia and that requires long-term follow-up.  As far as the leukemia is concerned she does have mild thrombocytopenia and moderate stable anemia.  We could initiate treatment at this point but I do not think she would live longer or feel any better if we did.  We will consider that again when I see her in August after her July mammography.  By that time she should be over her current orthopedic problems  She knows to call for any other issues that may develop before the next  visit here.  [name], [name] Dad, MD  09/17/18 4:10 PM Medical Oncology and Hematology [facility] [address] [place], [place] [address] Tel. [phone]    Fax. [phone]  I, Jacqualyn Posey am acting as a Education administrator for Chauncey Cruel, MD.   I, Lurline Del MD, have reviewed the above documentation for accuracy and completeness, and I agree with the above.

## 2018-09-17 ENCOUNTER — Inpatient Hospital Stay (HOSPITAL_BASED_OUTPATIENT_CLINIC_OR_DEPARTMENT_OTHER): Payer: Medicare Other | Admitting: Oncology

## 2018-09-17 ENCOUNTER — Telehealth: Payer: Self-pay | Admitting: Oncology

## 2018-09-17 VITALS — BP 119/58 | HR 85 | Temp 98.6°F | Resp 14 | Ht 64.0 in | Wt 199.5 lb

## 2018-09-17 DIAGNOSIS — Z79811 Long term (current) use of aromatase inhibitors: Secondary | ICD-10-CM | POA: Diagnosis not present

## 2018-09-17 DIAGNOSIS — Z170 Estrogen receptor positive status [ER+]: Secondary | ICD-10-CM

## 2018-09-17 DIAGNOSIS — D649 Anemia, unspecified: Secondary | ICD-10-CM

## 2018-09-17 DIAGNOSIS — D696 Thrombocytopenia, unspecified: Secondary | ICD-10-CM | POA: Diagnosis not present

## 2018-09-17 DIAGNOSIS — Z9221 Personal history of antineoplastic chemotherapy: Secondary | ICD-10-CM

## 2018-09-17 DIAGNOSIS — C50511 Malignant neoplasm of lower-outer quadrant of right female breast: Secondary | ICD-10-CM

## 2018-09-17 DIAGNOSIS — I82612 Acute embolism and thrombosis of superficial veins of left upper extremity: Secondary | ICD-10-CM | POA: Diagnosis not present

## 2018-09-17 DIAGNOSIS — C9110 Chronic lymphocytic leukemia of B-cell type not having achieved remission: Secondary | ICD-10-CM

## 2018-09-17 DIAGNOSIS — I808 Phlebitis and thrombophlebitis of other sites: Secondary | ICD-10-CM

## 2018-09-17 DIAGNOSIS — Z9011 Acquired absence of right breast and nipple: Secondary | ICD-10-CM | POA: Diagnosis not present

## 2018-09-17 DIAGNOSIS — Z923 Personal history of irradiation: Secondary | ICD-10-CM

## 2018-09-17 NOTE — Telephone Encounter (Signed)
Gave avs and calendar ° °

## 2018-09-20 DIAGNOSIS — M25551 Pain in right hip: Secondary | ICD-10-CM | POA: Diagnosis not present

## 2018-09-24 ENCOUNTER — Ambulatory Visit (INDEPENDENT_AMBULATORY_CARE_PROVIDER_SITE_OTHER): Payer: Medicare Other | Admitting: Obstetrics & Gynecology

## 2018-09-24 ENCOUNTER — Other Ambulatory Visit: Payer: Self-pay

## 2018-09-24 ENCOUNTER — Encounter: Payer: Self-pay | Admitting: Obstetrics & Gynecology

## 2018-09-24 VITALS — BP 122/70 | HR 72 | Resp 16 | Ht 64.75 in | Wt 199.0 lb

## 2018-09-24 DIAGNOSIS — Z01419 Encounter for gynecological examination (general) (routine) without abnormal findings: Secondary | ICD-10-CM | POA: Diagnosis not present

## 2018-09-24 DIAGNOSIS — Z124 Encounter for screening for malignant neoplasm of cervix: Secondary | ICD-10-CM | POA: Diagnosis not present

## 2018-09-24 NOTE — Progress Notes (Signed)
73 y.o. G1P1 Married White or Caucasian female here for annual exam.  [place] in her yard three weeks ago.  Has seen provider at [facility].  Had xrays that were negative but is not fully resolved.  She does have an MRI scheduled.  Not she still has pain around her hip and into her groin.  Also had a superficial clot in left arm that caused swelling.  Ultrasound showed acute superficial vein thrombosis in the left cephalic vein.    Denies vaginal bleeding.    Has been diagnosed with CLL.  WBC count was 55.  Will be seen again in August.    Using a walker today.    Saw Dr. Trenton Gammon, [place], just last week.  Continues to have back pain.   Patient's last menstrual period was 08/26/1995.          Sexually active: No.  The current method of family planning is status post hysterectomy.    Exercising: Yes.    occasional walking Smoker:  no  Health Maintenance: Pap:  10/09/15 Neg  History of abnormal Pap:  no MMG:  02/23/18 BIRADS2:benign  Colonoscopy:  11/03/14 normal. F/u 10 years  BMD:   12/15/13 low normal  TDaP:  2017 Pneumonia vaccine(s):  Pneumococcal - 07/25/18 Shingrix:   09/01/18 Hep C testing: never Screening Labs: PCP   reports that she has never smoked. She has never used smokeless tobacco. She reports that she does not drink alcohol or use drugs.  Past Medical History:  Diagnosis Date  . Alopecia   . Anxiety   . Arthritis    "spine" (07/28/2013)  . Breast cancer ([place]) 1997; 2014   right  . CAP (community acquired pneumonia)    admission 06-04-2013, failed outpatient  . Chronic back pain    "lower back and upper neck" (07/28/2013)  . CLL (chronic lymphocytic leukemia) ([place]) 05/2013  . DDD (degenerative disc disease)   . 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 ear   . Depression   . Diverticulosis   . Elevated LFTs   . Fibromyalgia 1978  . GERD (gastroesophageal reflux disease)   . Hematuria   . History of syncope    episode 2008--  no recurrence since  . HLD (hyperlipidemia)   . Hypothyroidism    . Kidney stones 2014  . Plantar fasciitis   . Ruptured disk    one in neck and two in back  . S/P chemotherapy, time since greater than 12 weeks   . Sinus tachycardia    mild resting  . Skin cancer   . Stool incontinence    "at times recently"     Past Surgical History:  Procedure Laterality Date  . ANTERIOR LATERAL LUMBAR FUSION 4 LEVELS Left 04/25/2016   Procedure: LEFT LUMBAR ONE-TWO, LUMBAR TWO-THREE, LUMBAR THREE-FOUR, LUMBAR FOUR-FIVE ANTERIOR LATERAL LUMBAR FUSION;  Surgeon: Earnie Larsson, MD;  Location: [place] [facility];  Service: Neurosurgery;  Laterality: Left;  . APPENDECTOMY  1997  . APPLICATION OF ROBOTIC ASSISTANCE FOR SPINAL PROCEDURE N/A 04/06/2017   Procedure: APPLICATION OF ROBOTIC ASSISTANCE FOR SPINAL PROCEDURE;  Surgeon: Earnie Larsson, MD;  Location: [place];  Service: Neurosurgery;  Laterality: N/A;  . BREAST BIOPSY Right 2014  . BREAST LUMPECTOMY Right 1997  . BREAST LUMPECTOMY WITH AXILLARY LYMPH NODE DISSECTION Right 1997  . CATARACT EXTRACTION, BILATERAL  2019   Dr. Kathlen Mody  . CYSTOSCOPY WITH RETROGRADE PYELOGRAM, URETEROSCOPY AND STENT PLACEMENT Bilateral 06/17/2013   Procedure: CYSTOSCOPY WITH RETROGRADE PYELOGRAM, URETEROSCOPY AND LEFT DOUBLE  J STENT  PLACEMENT RIGHT URETERAL HOLMIIUM LASER AND DOUBLE J STENT ;  Surgeon: Hanley Ben, MD;  Location: [place];  Service: Urology;  Laterality: Bilateral;  . HOLMIUM LASER APPLICATION Bilateral 77/[id number]   Procedure: HOLMIUM LASER APPLICATION;  Surgeon: Hanley Ben, MD;  Location: [place];  Service: Urology;  Laterality: Bilateral;  . LITHOTRIPSY Left 06/2013  . LUMBAR EPIDURAL INJECTION     has had 7 injections  . PORT-A-CATH REMOVAL Left 10/03/2014   Procedure: REMOVAL PORT-A-CATH;  Surgeon: Donnie Mesa, MD;  Location: [place];  Service: General;  Laterality: Left;  . PORTACATH PLACEMENT Left 07/28/2013   Procedure: ATTEMPTED INSERTION PORT-A-CATH;  Surgeon:  Imogene Burn. Georgette Dover, MD;  Location: [place];  Service: General;  Laterality: Left;  . POSTERIOR LUMBAR FUSION 4 LEVEL N/A 04/25/2016   Procedure: LUMBAR FIVE-SACRAL ONE POSTERIOR LUMBAR INTERBODY FUSION, THORACIC NINE-SACRAL ONE POSTERIOR LATERAL ARTHRODESIS WITH PEDICLE SCREWS;  Surgeon: Earnie Larsson, MD;  Location: [place] [facility];  Service: Neurosurgery;  Laterality: N/A;  . RETINAL DETACHMENT SURGERY Right 2013  . ROBOTIC ASSITED PARTIAL NEPHRECTOMY Right 02/05/2015   Procedure: ROBOTIC ASSITED PARTIAL NEPHRECTOMY;  Surgeon: Raynelle Bring, MD;  Location: [facility];  Service: Urology;  Laterality: Right;  . SIMPLE MASTECTOMY WITH AXILLARY SENTINEL NODE BIOPSY Right 07/28/2013   Procedure: RIGHT TOTAL  MASTECTOMY;  Surgeon: Imogene Burn. Georgette Dover, MD;  Location: [place];  Service: General;  Laterality: Right;  . TONSILLECTOMY  AGE 73  . TOTAL ABDOMINAL HYSTERECTOMY W/ BILATERAL SALPINGOOPHORECTOMY  1997    Current Outpatient Medications  Medication Sig Dispense Refill  . ALPRAZolam (XANAX) 0.5 MG tablet Take 0.5 mg by mouth 2 (two) times daily.     Marland Kitchen amitriptyline (ELAVIL) 150 MG tablet Take 150 mg by mouth at bedtime.    Marland Kitchen anastrozole (ARIMIDEX) 1 MG tablet Take 1 tablet (1 mg total) by mouth daily. (Patient taking differently: Take 1 mg by mouth daily after breakfast. ) 90 tablet 4  . DULoxetine (CYMBALTA) 30 MG capsule Take 90 mg by mouth daily after breakfast.     . gabapentin (NEURONTIN) 300 MG capsule Take 300mg s by mouth daily (Patient taking differently: Take 300 mg by mouth at bedtime. ) 90 capsule 4  . levothyroxine (SYNTHROID, LEVOTHROID) 88 MCG tablet Take 88 mcg by mouth daily before breakfast.   4  . omeprazole (PRILOSEC) 40 MG capsule TAKE 1 CAPSULE(40 MG) BY MOUTH EVERY MORNING (Patient taking differently: Take 40 mg by mouth daily after breakfast. ) 90 capsule 0  . oxyCODONE-acetaminophen (PERCOCET) 5-325 MG tablet Take 1-2 tablets by mouth every 4 (four) hours as needed for severe pain. 60 tablet 0   . REXULTI 2 MG TABS Take 2 mg by mouth at bedtime.   1   No current facility-administered medications for this visit.     Family History  Problem Relation Age of Onset  . Heart disease Maternal Grandfather   . Diabetes Maternal Grandfather   . Colon cancer Maternal Aunt 79  . Brain cancer Paternal Grandmother        dx in 44s  . Dementia Mother   . Diabetes Mother   . Osteoporosis Mother   . Diabetes Maternal Aunt   . Prostate cancer Father 77  . Bipolar disorder Maternal Aunt   . Stroke Maternal Aunt   . Stomach cancer Paternal Uncle        dx in late 62s    Review of Systems  Constitutional: Negative.   HENT:  Negative.   Eyes: Negative.   Respiratory: Negative.   Cardiovascular: Negative.   Gastrointestinal: Negative.   Endocrine: Negative.   Genitourinary: Negative.   Musculoskeletal: Negative.   Skin: Negative.   Allergic/Immunologic: Negative.   Neurological: Negative.   Hematological: Negative.   Psychiatric/Behavioral: Negative.    Exam:   BP 122/70 (BP Location: Left Arm, Patient Position: Sitting, Cuff Size: Large)   Pulse 72   Resp 16   Ht 5' 4.75" (1.645 m)   Wt 199 lb (90.3 kg)   LMP 08/26/1995   BMI 33.37 kg/m   Height: 5' 4.75" (164.5 cm)  Ht Readings from Last 3 Encounters:  09/24/18 5' 4.75" (1.645 m)  09/17/18 5\' 4"  (1.626 m)  08/23/18 5\' 4"  (1.626 m)   General appearance: alert, cooperative and appears stated age Head: Normocephalic, without obvious abnormality, atraumatic Neck: no adenopathy, supple, symmetrical, trachea midline and thyroid normal to inspection and palpation Lungs: clear to auscultation bilaterally Breasts: normal appearance, no masses or tenderness Heart: regular rate and rhythm Abdomen: soft, non-tender; bowel sounds normal; no masses,  no organomegaly Extremities: extremities normal, atraumatic, no cyanosis or edema Skin: Skin color, texture, turgor normal. No rashes or lesions Lymph nodes: Cervical,  supraclavicular, and axillary nodes normal. No abnormal inguinal nodes palpated Neurologic: Grossly normal   Pelvic: External genitalia:  no lesions              Urethra:  normal appearing urethra with no masses, tenderness or lesions              Bartholins and Skenes: normal                 Vagina: normal appearing vagina with normal color and discharge, no lesions              Cervix: absent              Pap taken: No. Bimanual Exam:  Uterus:  uterus absent              Adnexa: no mass, fullness, tenderness               Rectovaginal: Confirms               Anus:  normal sphincter tone, no lesions  Chaperone was present for exam.  A:  Well Woman with normal exam PMP, no HRT H/o TAH/BSO Breast cancer hx 1997, s/p lumpectomy ,radiation and 5 years of Tamoxifen.  Recurrence 2013.  On anastrozole.but will finish this year. H/o CLL, s/p partial nephrectomy H/o fatty liver disease 2010 Uses hearing aids Hypothyroidism  Depression  P:   Mammogram guidelines reviewed pap smear not indicated Lab work done with Dr. Jana Hakim, Dr. Shelia Media.  Being seen again this summer for whether she needs to start treatment for CLL Colonoscopy is UTD return annually or prn

## 2018-09-27 DIAGNOSIS — M25551 Pain in right hip: Secondary | ICD-10-CM | POA: Diagnosis not present

## 2018-09-28 ENCOUNTER — Inpatient Hospital Stay: Payer: Medicare Other

## 2018-10-04 DIAGNOSIS — M25551 Pain in right hip: Secondary | ICD-10-CM | POA: Diagnosis not present

## 2018-10-04 DIAGNOSIS — S32511A Fracture of superior rim of right pubis, initial encounter for closed fracture: Secondary | ICD-10-CM | POA: Diagnosis not present

## 2018-10-14 ENCOUNTER — Other Ambulatory Visit: Payer: Self-pay | Admitting: Oncology

## 2018-10-14 DIAGNOSIS — C50011 Malignant neoplasm of nipple and areola, right female breast: Secondary | ICD-10-CM

## 2018-10-14 DIAGNOSIS — C9110 Chronic lymphocytic leukemia of B-cell type not having achieved remission: Secondary | ICD-10-CM

## 2018-10-28 IMAGING — CT CT L SPINE W/O CM
3 series · 12 of 33 positions shown, 14 images · non-contrast
Comparison: CT lumbar spine 02/19/2017

CLINICAL DATA: History of lumbar spine surgery with worsening pain.

EXAM:
CT LUMBAR SPINE WITHOUT CONTRAST
TECHNIQUE: Multidetector CT imaging of the lumbar spine was performed without
intravenous contrast administration. Multiplanar CT image
reconstructions were also generated.

[Series 5: l spine 2.0 i30s 3 · axial · 0.38mm/px · z∈[+903,+1129]mm · 4 of 165 slices shown, 5 images]
[im 26/165  soft-tissue]
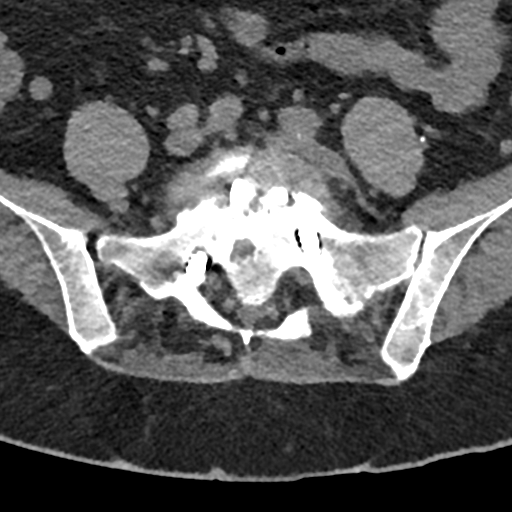
[im 26/165  bone]
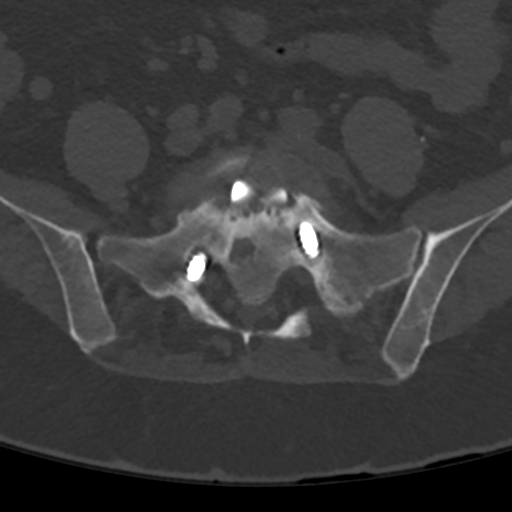
[im 64/165  bone]
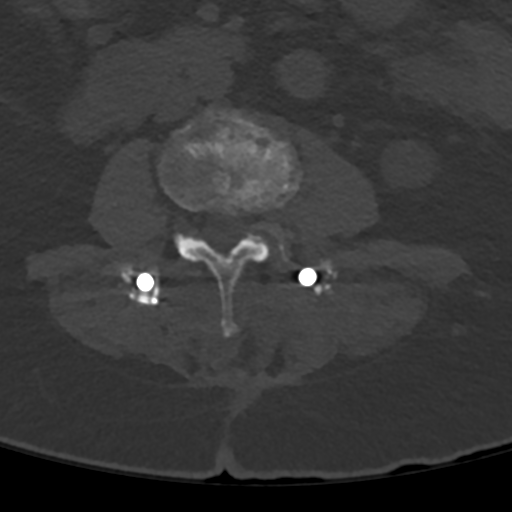
[im 101/165  bone]
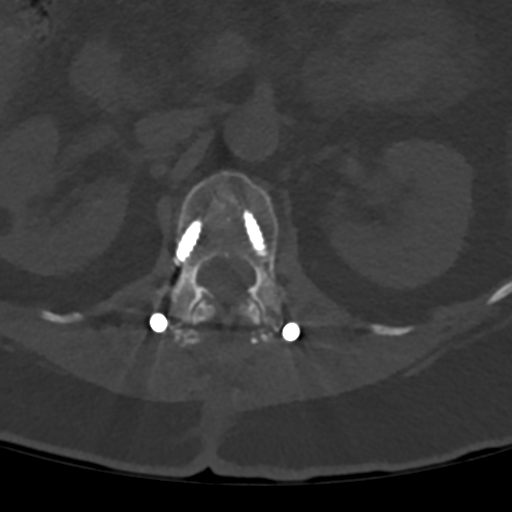
[im 139/165  bone]
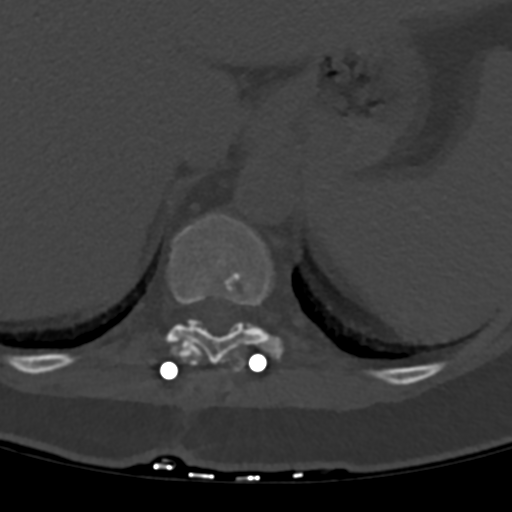

[Series 6: l spine 2.0 mpr st cor · coronal · 0.49mm/px · 3 of 86 slices shown]
[im 18/86  bone]
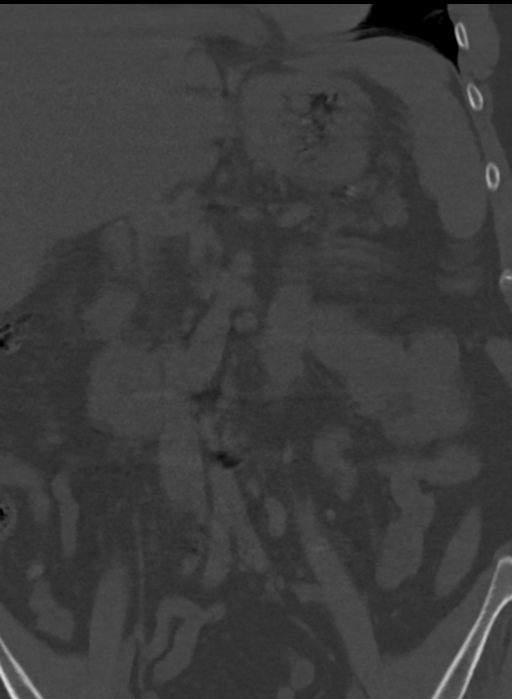
[im 35/86  bone]
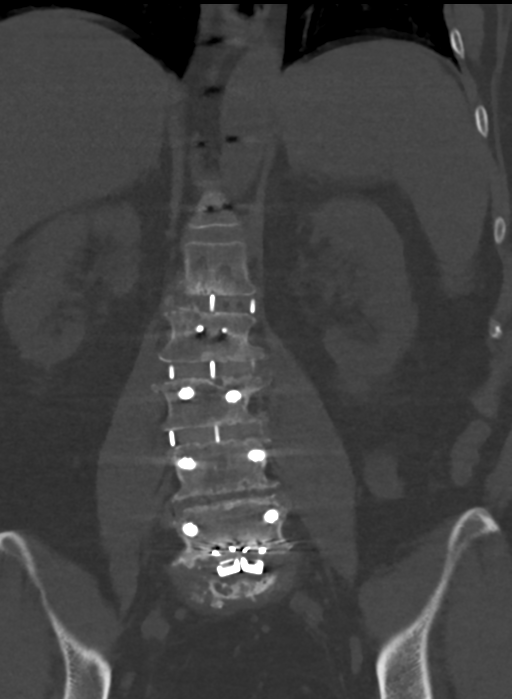
[im 52/86  bone]
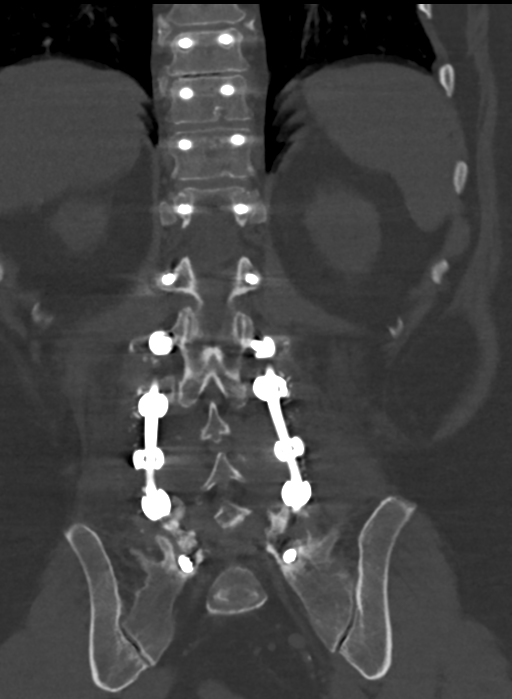

[Series 7: l spine 2.0 mpr st sag · sagittal · 0.43mm/px · 5 of 101 slices shown, 6 images]
[im 34/101  bone]
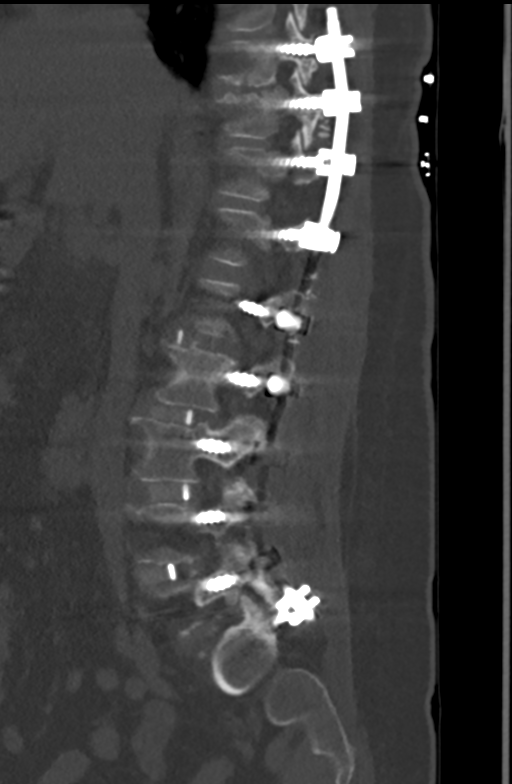
[im 42/101  bone]
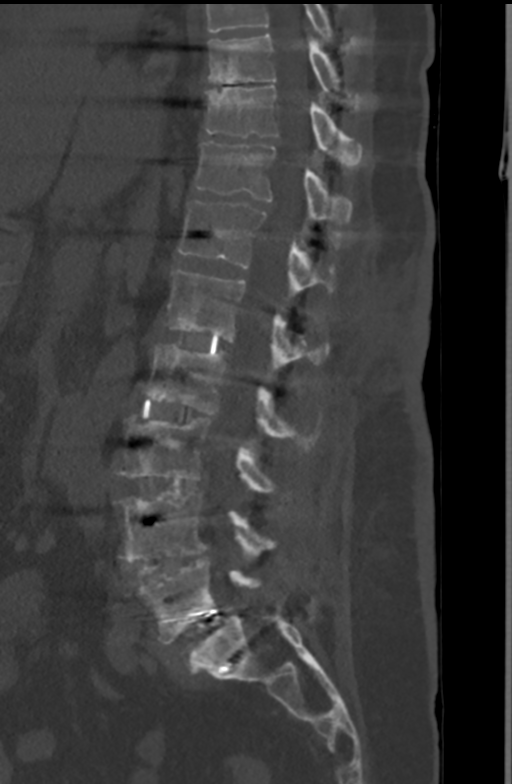
[im 51/101  soft-tissue]
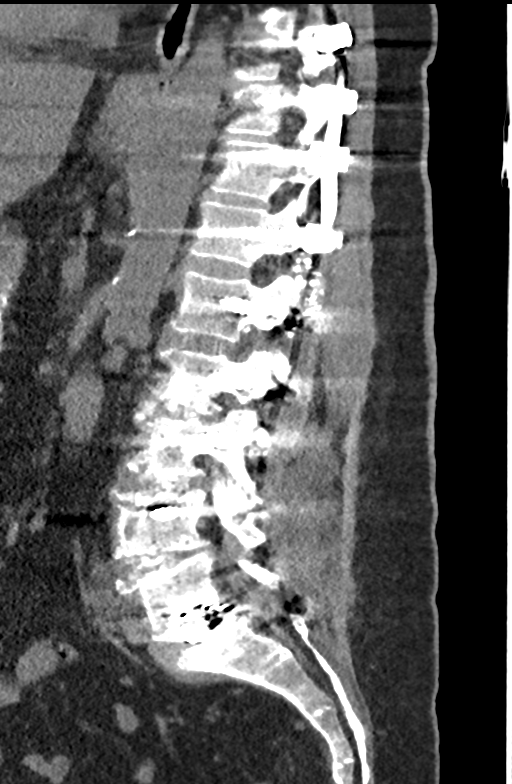
[im 51/101  bone]
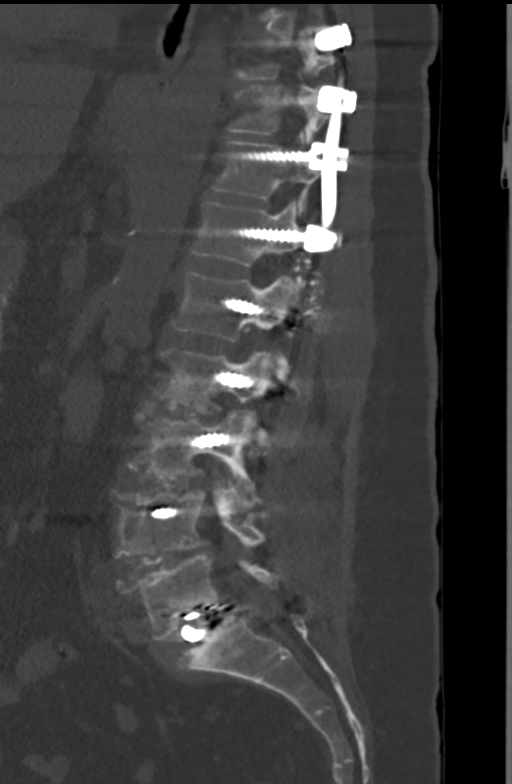
[im 59/101  bone]
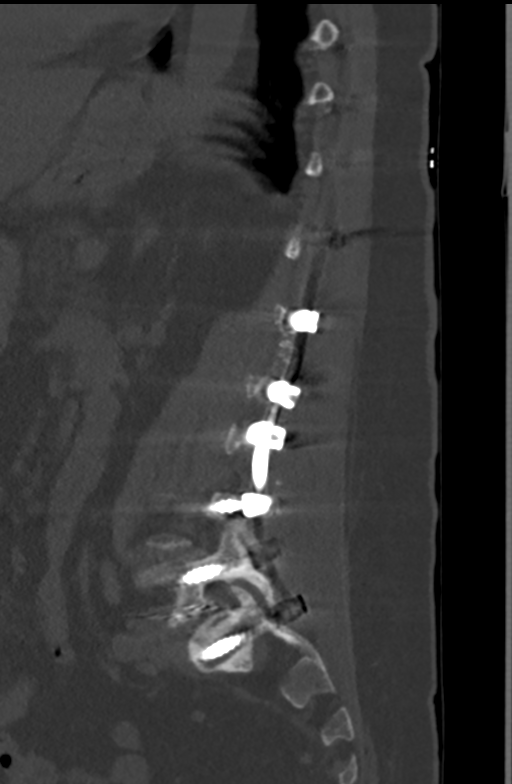
[im 67/101  bone]
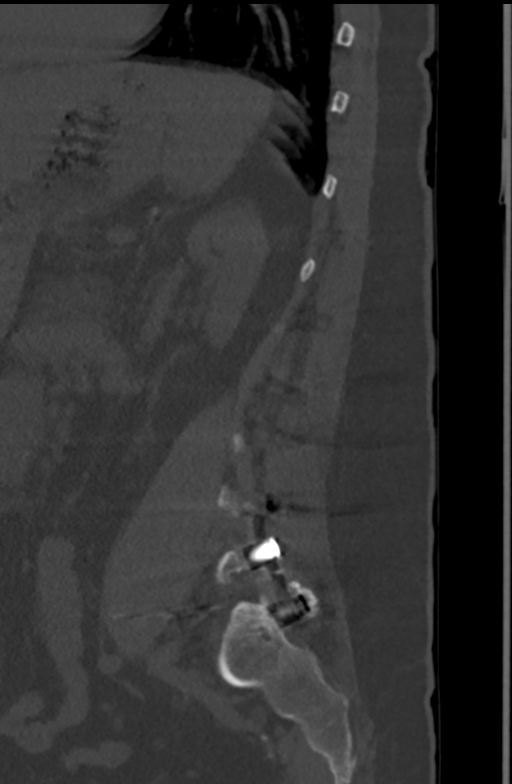

[12 of 33 positions shown; findings below may reference images not displayed]

FINDINGS: Segmentation: Normal

Alignment: Grade 1 L5-S1 anterolisthesis, unchanged.

Vertebrae: Posterior spinal rods extend from T9-S1. There is
interbody fusion at the L1-S1 levels. There is unchanged lucency
surrounding the S1 screws. There is mild lucency surrounding the
transpedicular screws at T9, right-greater-than-left. This level was
not imaged on the prior study. Mild subsidence of the L5-S1 disc
spacer is unchanged. There is no completed osseous fusion of the
lumbar spine.

Paraspinal and other soft tissues: There is a fat-containing mass of
the right kidney, most consistent with angiomyolipoma.

Disc levels:

T8-T9:  Normal disc space without stenosis.

T9-T10: Disc space narrowing without significant herniation or
stenosis.

T10-T11: Disc space narrowing without significant herniation. No
stenosis.

T11-T12:  Mild disc degeneration without stenosis.

T12-L1:  Preserved disc space.  No stenosis.

L1-L2: Postfusion changes without osseous completion. No spinal
canal or neural foraminal stenosis. No spacer subsidence.

L2-L3: Postfusion changes without complete osseous bridging. There
is left-greater-than-right endplate spurring causing moderate left
neural foraminal stenosis. No spinal canal stenosis. These findings
are unchanged.

L3-L4: Postfusion changes. No spinal canal stenosis. Moderate left
neural foraminal stenosis, primarily due to facet and endplate
hypertrophy. These findings are unchanged.

L4-L5: Postfusion changes with wide patency of the thecal sac.
Mild-to-moderate bilateral neural foraminal stenosis, primarily
caused by endplate spurring.

L5-S1:  No spinal canal or neural foraminal stenosis.
IMPRESSION: 1. Posterior spinal rods from T9-S1 with L1-S1 interbody fusion.
Unchanged lucency surrounding both S1 screws, consistent with
loosening. Mild lucency surrounding the T9 screws, right worse than
left, also suggestive of loosening.
2. Mild-to-moderate lower lumbar neural foraminal stenosis at L2-L3,
L3-L4 and L4-L5, unchanged.
3. No spinal canal stenosis.

## 2018-11-01 DIAGNOSIS — S32511D Fracture of superior rim of right pubis, subsequent encounter for fracture with routine healing: Secondary | ICD-10-CM | POA: Diagnosis not present

## 2018-12-28 ENCOUNTER — Other Ambulatory Visit: Payer: Medicare Other

## 2018-12-30 ENCOUNTER — Ambulatory Visit: Payer: Medicare Other | Admitting: Oncology

## 2019-01-26 DIAGNOSIS — F331 Major depressive disorder, recurrent, moderate: Secondary | ICD-10-CM | POA: Diagnosis not present

## 2019-01-26 DIAGNOSIS — F411 Generalized anxiety disorder: Secondary | ICD-10-CM | POA: Diagnosis not present

## 2019-02-04 ENCOUNTER — Other Ambulatory Visit: Payer: Self-pay | Admitting: Oncology

## 2019-02-04 DIAGNOSIS — C9110 Chronic lymphocytic leukemia of B-cell type not having achieved remission: Secondary | ICD-10-CM

## 2019-02-04 DIAGNOSIS — C50011 Malignant neoplasm of nipple and areola, right female breast: Secondary | ICD-10-CM

## 2019-03-16 DIAGNOSIS — F331 Major depressive disorder, recurrent, moderate: Secondary | ICD-10-CM | POA: Diagnosis not present

## 2019-03-16 DIAGNOSIS — F411 Generalized anxiety disorder: Secondary | ICD-10-CM | POA: Diagnosis not present

## 2019-03-17 DIAGNOSIS — R05 Cough: Secondary | ICD-10-CM | POA: Diagnosis not present

## 2019-03-17 DIAGNOSIS — J42 Unspecified chronic bronchitis: Secondary | ICD-10-CM | POA: Diagnosis not present

## 2019-03-17 DIAGNOSIS — C9110 Chronic lymphocytic leukemia of B-cell type not having achieved remission: Secondary | ICD-10-CM | POA: Diagnosis not present

## 2019-03-18 ENCOUNTER — Telehealth: Payer: Self-pay | Admitting: *Deleted

## 2019-03-18 NOTE — Telephone Encounter (Signed)
"  [name] [name] [phone]) returning call to let you know I've got bronchitis.  Waiting for antibiotic orders.  Do not feel well enough to come in today.  Can this wait until Monday?"  "Have not had fever, night sweats or weight loss.    Ignored my cough because I cough anyway.  Began feeling tired, coughing up stuff past couple of weeks now and short of breath.  Dr. Shelia Media did not checked for Coronavirus because I lost my sense of smell over a year ago.    Return call to let me know what I need to do next."

## 2019-03-18 NOTE — Telephone Encounter (Signed)
She can wait until next week. Please get her on either Dr. Virgie Dad schedule or mine.  Please also put her on the lab's schedule. Thanks, Lucianne Lei

## 2019-03-18 NOTE — Telephone Encounter (Signed)
Connected with [facility], [facility] referral department.  Patient has left office.  Requested visit note, lab results.    Message left for ANGEL HOBDY to return call to Triage.

## 2019-03-18 NOTE — Telephone Encounter (Signed)
Will do. Thanks.

## 2019-03-18 NOTE — Telephone Encounter (Signed)
Message left to expect call with appointment.  Examining provider schedule for available work-in.  Requested return call with changes or symptoms.

## 2019-03-18 NOTE — Telephone Encounter (Signed)
"  [facility], Dr. Linnell Fulling [phone]) calling for [name] [name].  She needs a STAT appointment, Dr. Jana Hakim.  WBC is over seventy-two thousand.  Please give me a call."

## 2019-03-21 ENCOUNTER — Telehealth: Payer: Self-pay | Admitting: *Deleted

## 2019-03-21 NOTE — Telephone Encounter (Signed)
This RN spoke with pt post review of labs and office visit from Dr Pennie Banter - with MD request for lab and visit this week and possible need to start an oral medication.  Appointment made by this RN.  Pt verbalized understanding.

## 2019-03-21 NOTE — Telephone Encounter (Signed)
"  [name] [name] [phone]).  Dr. Pennie Banter office wants me to be seen by Dr. Jana Hakim.  I can't be seen today.  Having problem with back pain, ruptured disc flair up today.  Able to come any other day this week in the afternoon if that's possible.  Thanks.  Bronchitis is getting better."

## 2019-03-24 NOTE — Progress Notes (Signed)
ID: [name] OB: 1945/11/03  MR#: [id number]  YDX#:[id number]  Patient Care Team: Deland Pretty, MD as PCP - General (Internal Medicine) Sadonna Kotara, Virgie Dad, MD as Consulting Physician (Oncology) Milus Banister, MD as Attending Physician (Gastroenterology) Megan Salon, MD as Consulting Physician (Gynecology) Donnie Mesa, MD as Consulting Physician (General Surgery) Noemi Chapel, NP as Nurse Practitioner Rod Can, MD as Consulting Physician (Orthopedic Surgery)   CHIEF COMPLAINT: Estrogen receptor positive Right Breast Cancer; chronic lymphoid leukemia  CURRENT TREATMENT: Observation   BREAST CANCER HISTORY: From the prior summary:  [name] [name] has a history of breast cancer dating back to 1997, [name] performed a right lumpectomy and axillary lymph node dissection for what according to the patient and her family was a stage I invasive ductal breast cancer. She received radiation adjuvantly and then took tamoxifen for 5 years  In February of 2014 she had a normal screening mammography, but in November 2014 she felt a "hard lump" in her right breast. She brought this to the attention of her urologist, Dr. Izora Gala, and he set her up for bilateral diagnostic mammography with mammography at [facility] 07/13/2013. This showed her breast density to be category B. There was no mammographic abnormality noted but ultrasonography showed a 1.4 cm irregularly shaped solid mass in the right breast at the 8:00 position. This was biopsied the same day, and showed ([id number]) an invasive ductal carcinoma, grade 2, estrogen receptor 100% positive, progesterone receptor 13% positive, with an MIB-1 of 33% and HER-2 amplification with a HER-2: CEP 17 ratio of 3.19, and a HER-2 copy number percent all of 4.15.  The patient's subsequent history is as detailed below   INTERVAL HISTORY: Nelsy returns today for follow-up and treatment of her estrogen receptor positive breast cancer.    She continues under observation.  She completed her anastrozole in 11/2018. She tolerated this well.   Since her last visit here, she has not undergone any additional studies. She is behind on her annual mammography.   REVIEW OF SYSTEMS: Aaron has been having some shortness of breath, even with mild activity. She feels fatigued. She has had an occasional coughing spell. 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 She has significantly cut the amount of food that she has been eating; she has been doing the slim fast diet. She has been taking appropriate precautions against the spread of the pandemic. The patient denies unusual headaches, visual changes, nausea, vomiting, or dizziness. This been no change in bowel or bladder habits. The patient denies unexplained weight loss, bleeding, rash, or fever. A detailed review of systems was otherwise noncontributory.    PAST MEDICAL HISTORY: Past Medical History:  Diagnosis Date  . Alopecia   . Anxiety   . Arthritis    "spine" (07/28/2013)  . Breast cancer ([place]) 1997; 2014   right  . CAP (community acquired pneumonia)    admission 06-04-2013, failed outpatient  . Chronic back pain    "lower back and upper neck" (07/28/2013)  . CLL (chronic lymphocytic leukemia) ([place]) 05/2013  . DDD (degenerative disc disease)   . 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 ear   . Depression   . Diverticulosis   . Elevated LFTs   . Fibromyalgia 1978  . GERD (gastroesophageal reflux disease)   . Hematuria   . History of syncope    episode 2008--  no recurrence since  . HLD (hyperlipidemia)   . Hypothyroidism   . Kidney stones 2014  . Plantar  fasciitis   . Ruptured disk    one in neck and two in back  . S/P chemotherapy, time since greater than 12 weeks   . Sinus tachycardia    mild resting  . Skin cancer   . Stool incontinence    "at times recently"     PAST SURGICAL HISTORY: Past Surgical History:  Procedure Laterality Date  . ANTERIOR LATERAL LUMBAR  FUSION 4 LEVELS Left 04/25/2016   Procedure: LEFT LUMBAR ONE-TWO, LUMBAR TWO-THREE, LUMBAR THREE-FOUR, LUMBAR FOUR-FIVE ANTERIOR LATERAL LUMBAR FUSION;  Surgeon: Earnie Larsson, MD;  Location: [place] [facility];  Service: Neurosurgery;  Laterality: Left;  . APPENDECTOMY  1997  . APPLICATION OF ROBOTIC ASSISTANCE FOR SPINAL PROCEDURE N/A 04/06/2017   Procedure: APPLICATION OF ROBOTIC ASSISTANCE FOR SPINAL PROCEDURE;  Surgeon: Earnie Larsson, MD;  Location: [facility];  Service: Neurosurgery;  Laterality: N/A;  . BREAST BIOPSY Right 2014  . BREAST LUMPECTOMY Right 1997  . BREAST LUMPECTOMY WITH AXILLARY LYMPH NODE DISSECTION Right 1997  . CATARACT EXTRACTION, BILATERAL  2019   Dr. Kathlen Mody  . CYSTOSCOPY WITH RETROGRADE PYELOGRAM, URETEROSCOPY AND STENT PLACEMENT Bilateral 06/17/2013   Procedure: CYSTOSCOPY WITH RETROGRADE PYELOGRAM, URETEROSCOPY AND LEFT DOUBLE  J STENT PLACEMENT RIGHT URETERAL HOLMIIUM LASER AND DOUBLE J STENT ;  Surgeon: Hanley Ben, MD;  Location: [place];  Service: Urology;  Laterality: Bilateral;  . HOLMIUM LASER APPLICATION Bilateral 93/79/0240   Procedure: HOLMIUM LASER APPLICATION;  Surgeon: Hanley Ben, MD;  Location: [place];  Service: Urology;  Laterality: Bilateral;  . LITHOTRIPSY Left 06/2013  . LUMBAR EPIDURAL INJECTION     has had 7 injections  . PORT-A-CATH REMOVAL Left 10/03/2014   Procedure: REMOVAL PORT-A-CATH;  Surgeon: Donnie Mesa, MD;  Location: [place];  Service: General;  Laterality: Left;  . PORTACATH PLACEMENT Left 07/28/2013   Procedure: ATTEMPTED INSERTION PORT-A-CATH;  Surgeon: Imogene Burn. Georgette Dover, MD;  Location: [facility];  Service: General;  Laterality: Left;  . POSTERIOR LUMBAR FUSION 4 LEVEL N/A 04/25/2016   Procedure: LUMBAR FIVE-SACRAL ONE POSTERIOR LUMBAR INTERBODY FUSION, THORACIC NINE-SACRAL ONE POSTERIOR LATERAL ARTHRODESIS WITH PEDICLE SCREWS;  Surgeon: Earnie Larsson, MD;  Location: [place] [facility];  Service:  Neurosurgery;  Laterality: N/A;  . RETINAL DETACHMENT SURGERY Right 2013  . ROBOTIC ASSITED PARTIAL NEPHRECTOMY Right 02/05/2015   Procedure: ROBOTIC ASSITED PARTIAL NEPHRECTOMY;  Surgeon: Raynelle Bring, MD;  Location: [facility];  Service: Urology;  Laterality: Right;  . SIMPLE MASTECTOMY WITH AXILLARY SENTINEL NODE BIOPSY Right 07/28/2013   Procedure: RIGHT TOTAL  MASTECTOMY;  Surgeon: Imogene Burn. Georgette Dover, MD;  Location: [place];  Service: General;  Laterality: Right;  . TONSILLECTOMY  AGE 73  . TOTAL ABDOMINAL HYSTERECTOMY W/ BILATERAL SALPINGOOPHORECTOMY  1997    FAMILY HISTORY Family History  Problem Relation Age of Onset  . Heart disease Maternal Grandfather   . Diabetes Maternal Grandfather   . Colon cancer Maternal Aunt 79  . Brain cancer Paternal Grandmother        dx in 60s  . Dementia Mother   . Diabetes Mother   . Osteoporosis Mother   . Diabetes Maternal Aunt   . Prostate cancer Father 33  . Bipolar disorder Maternal Aunt   . Stroke Maternal Aunt   . Stomach cancer Paternal Uncle        dx in late 69s   the patient's mother died at the age of 50. The patient's father is alive at age 79. She had no  brothers or sisters. Her father has a history of prostate cancer. There is no history of breast or ovarian cancer in the family. One maternal first cousin has a history of Hodgkin's lymphoma.  GYNECOLOGIC HISTORY:  Menarche age 41, first live birth age 28. She is GX P1. She underwent total abdominal hysterectomy and bilateral salpingo-oophorectomy in 1997 she did not take hormone replacement.  SOCIAL HISTORY: (Updated  11/15/2013)  She is a homemaker. Her husband Arnette Norris farms approximately 500 acres, including quite a bit of tobacco. Daughter Colletta [place] lives in [place] where she works as a Pension scheme manager for a drug company. The patient has no grandchildren. She has two "grand cats". She attends a local united church of [place]: Not in  place   HEALTH MAINTENANCE:  (Updated   11/15/2013 ) Social History   Tobacco Use  . Smoking status: Never Smoker  . Smokeless tobacco: Never Used  Substance Use Topics  . Alcohol use: No    Comment: 07/28/2013 "no alcohol since 1994; never had problem w/it"  . Drug use: No     Colonoscopy: 2009/ [facility]  PAP: Status post hysterectomy  Bone density: May 10/14/2009 at [facility] was normal  Lipid panel:  Not on file   Allergies  Allergen Reactions  . Lasix [Furosemide] Nausea And Vomiting and Other (See Comments)    HEADACHE  . Lithium Other (See Comments)    Dizziness, "cause me to fall"  . Sulfa Antibiotics Hives  . Trazodone And Nefazodone Other (See Comments)    insomnia  . Lyrica [Pregabalin] Diarrhea, Nausea Only and Rash    Current Outpatient Medications  Medication Sig Dispense Refill  . ALPRAZolam (XANAX) 0.5 MG tablet Take 0.5 mg by mouth 2 (two) times daily.     Marland Kitchen amitriptyline (ELAVIL) 150 MG tablet Take 150 mg by mouth at bedtime.    Marland Kitchen anastrozole (ARIMIDEX) 1 MG tablet Take 1 tablet (1 mg total) by mouth daily. (Patient taking differently: Take 1 mg by mouth daily after breakfast. ) 90 tablet 4  . DULoxetine (CYMBALTA) 30 MG capsule Take 90 mg by mouth daily after breakfast.     . gabapentin (NEURONTIN) 300 MG capsule Take 380ms by mouth daily (Patient taking differently: Take 300 mg by mouth at bedtime. ) 90 capsule 4  . levothyroxine (SYNTHROID, LEVOTHROID) 88 MCG tablet Take 88 mcg by mouth daily before breakfast.   4  . omeprazole (PRILOSEC) 40 MG capsule TAKE 1 CAPSULE(40 MG) BY MOUTH DAILY AFTER BREAKFAST 90 capsule 0  . oxyCODONE-acetaminophen (PERCOCET) 5-325 MG tablet Take 1-2 tablets by mouth every 4 (four) hours as needed for severe pain. 60 tablet 0  . REXULTI 2 MG TABS Take 2 mg by mouth at bedtime.   1   No current facility-administered medications for this visit.     OBJECTIVE: 73year old white woman   Vitals:   03/25/19 1241  BP: 106/70   Pulse: (!) 104  Resp: 18  Temp: 98 F (36.7 C)  SpO2: 97%    Body mass index is 34.09 kg/m.      Wt Readings from Last 3 Encounters:  03/25/19 203 lb 4.8 oz (92.2 kg)  09/24/18 199 lb (90.3 kg)  09/17/18 199 lb 8 oz (90.5 kg)   ECOG FS:2 - Symptomatic, <50% confined to bed  Sclerae unicteric, EOMs intact Wearing a mask No cervical or supraclavicular adenopathy, no cervical or inguinal adenopathy Lungs no rales or rhonchi Heart regular rate and rhythm  Abd soft, nontender, positive bowel sounds MSK no focal spinal tenderness, no upper extremity lymphedema Neuro: nonfocal, well oriented, appropriate affect Breasts: The right breast is status post mastectomy.  There is no evidence of chest wall recurrence.  The left breast is benign.    LAB RESULTS:  Lab Results  Component Value Date   WBC 84.8 (HH) 03/25/2019   NEUTROABS 5.1 03/25/2019   HGB 12.2 03/25/2019   HCT 40.6 03/25/2019   MCV 89.8 03/25/2019   PLT 259 03/25/2019      Chemistry      Component Value Date/Time   NA 140 08/23/2018 0700   NA 140 03/25/2017 1300   K 4.4 08/23/2018 0700   K 4.2 03/25/2017 1300   CL 109 08/23/2018 0700   CO2 22 08/23/2018 0700   CO2 24 03/25/2017 1300   BUN 14 08/23/2018 0700   BUN 17.9 03/25/2017 1300   CREATININE 0.97 08/23/2018 0829   CREATININE 1.10 (H) 06/29/2018 1258   CREATININE 1.1 03/25/2017 1300      Component Value Date/Time   CALCIUM 8.7 (L) 08/23/2018 0700   CALCIUM 9.4 03/25/2017 1300   ALKPHOS 69 08/22/2018 2104   ALKPHOS 102 03/25/2017 1300   AST 24 08/22/2018 2104   AST 21 06/29/2018 1258   AST 41 (H) 03/25/2017 1300   ALT 17 08/22/2018 2104   ALT 20 06/29/2018 1258   ALT 52 03/25/2017 1300   BILITOT 0.8 08/22/2018 2104   BILITOT <0.2 (L) 06/29/2018 1258   BILITOT 0.36 03/25/2017 1300      STUDIES: No results found.    ASSESSMENT: 73 y.o. BRCA negative [place] woman  (1) status post right breast lumpectomy and axillary lymph node  dissection in 1997 for a stage I breast cancer, treated with adjuvant radiation and tamoxifen for 5 years  (2) status post right breast lower outer quadrant biopsy 07/13/2013 for a clinical T1c N0, stage IA invasive ductal carcinoma, grade 2, estrogen receptor 100% positive, progesterone receptor 13% positive, with an MIB-1 of 33%, and HER-2 amplification by CISH with a HER2/CEP 17 ratio of 3.19, and an average HER-2 copy number per cell of 4.15  (3) status post right mastectomy 07/28/2013 for a pT1c pN0, stage IA invasive ductal carcinoma, grade 3, with close but negative margins. Prognostic panel was not repeated  (a) the patient met with Dr. Harlow Mares and has decided against reconstruction  (4) completed weekly paclitaxel x12 12/06/2913, with trastuzumab/ pertuzumab every 3 weeks; pertuzumab was held with third dose on 11/01/2013 due to diarrhea, tried a half dose on cycle 4 again with diarrhea developing  (5) trastuzumab (started 09/20/2013) continued for 1 year, last dose 09/21/2014;  (a) final echocardiogram 09/07/2014 showed an ejection fraction of 55-60%  (6) anastrozole started May 2015; completed April 2020  (a) bone density 12/15/2013 normal  (b) bone density 02/01/2016 at [place] was normal with a T score of -1.0   OTHER PROBLEMS: (a) History of chronic lymphoid leukemia diagnosed by flow cytometry 06/29/2013, the cells being CD5, CD20 and CD23 positive, CD10 negative.   (1) right renal mass resected on 02/05/15, consisting of atypical lymphoid proliferation composed of monotonous small lymphocytes   (b) Anemia with a normal MCV and normal ferritin-- B-12 and folate normal; stable  (c) ibrutinib milligrams daily started 08/01/202020    PLAN: Samarrah is 5-1/2 years out from definitive surgery for her breast cancer.  She has completed 5 years of antiestrogen therapy.  As opposed to her breast cancer is  concerned what she needs is her yearly left screening mammography, which I have just  ordered for her, and a left breast and chest wall exam which I have just.  Her CLL is showing a doubling time of just about 6 months.  Even though we do not have anemia or thrombocytopenia I am going to start treating her.  She will take allopurinol and ibrutinib, the latter at 280 mg daily.  We discussed the possible toxicities side effects and complications of these agents including the possibility of bruising and heart rhythm issues from the ibrutinib  I do not have a simple explanation for her shortness of breath.  It could all be deconditioning.  She did have a cardiology evaluation 2-1/2 years ago but she does not want to go back to that cardiologist whom she feels less offensive to her body.  I am making a referral to a different cardiologist for further evaluation  She will return to see [place] in approximately 6 weeks to make sure she is tolerating her CLL treatment well and that it is working appropriately  She knows to call for any other issue that may develop before then.   Brandis Matsuura, Virgie Dad, MD  03/25/19 1:24 PM Medical Oncology and Hematology [facility] [address] [place], [place] [address] Tel. [phone]    Fax. [phone]  I, Jacqualyn Posey am acting as a Education administrator for Chauncey Cruel, MD.   I, Lurline Del MD, have reviewed the above documentation for accuracy and completeness, and I agree with the above.

## 2019-03-25 ENCOUNTER — Telehealth: Payer: Self-pay | Admitting: Pharmacist

## 2019-03-25 ENCOUNTER — Telehealth: Payer: Self-pay

## 2019-03-25 ENCOUNTER — Other Ambulatory Visit: Payer: Self-pay

## 2019-03-25 ENCOUNTER — Inpatient Hospital Stay: Payer: Medicare Other | Attending: Oncology

## 2019-03-25 ENCOUNTER — Inpatient Hospital Stay (HOSPITAL_BASED_OUTPATIENT_CLINIC_OR_DEPARTMENT_OTHER): Payer: Medicare Other | Admitting: Oncology

## 2019-03-25 VITALS — BP 106/70 | HR 104 | Temp 98.0°F | Resp 18 | Wt 203.3 lb

## 2019-03-25 DIAGNOSIS — M1990 Unspecified osteoarthritis, unspecified site: Secondary | ICD-10-CM | POA: Diagnosis not present

## 2019-03-25 DIAGNOSIS — Z888 Allergy status to other drugs, medicaments and biological substances status: Secondary | ICD-10-CM | POA: Insufficient documentation

## 2019-03-25 DIAGNOSIS — Z170 Estrogen receptor positive status [ER+]: Secondary | ICD-10-CM | POA: Insufficient documentation

## 2019-03-25 DIAGNOSIS — Z882 Allergy status to sulfonamides status: Secondary | ICD-10-CM | POA: Insufficient documentation

## 2019-03-25 DIAGNOSIS — R197 Diarrhea, unspecified: Secondary | ICD-10-CM | POA: Diagnosis not present

## 2019-03-25 DIAGNOSIS — Z833 Family history of diabetes mellitus: Secondary | ICD-10-CM | POA: Insufficient documentation

## 2019-03-25 DIAGNOSIS — Z87442 Personal history of urinary calculi: Secondary | ICD-10-CM

## 2019-03-25 DIAGNOSIS — Z820 Family history of epilepsy and other diseases of the nervous system: Secondary | ICD-10-CM | POA: Diagnosis not present

## 2019-03-25 DIAGNOSIS — Z823 Family history of stroke: Secondary | ICD-10-CM

## 2019-03-25 DIAGNOSIS — Z79899 Other long term (current) drug therapy: Secondary | ICD-10-CM

## 2019-03-25 DIAGNOSIS — C9110 Chronic lymphocytic leukemia of B-cell type not having achieved remission: Secondary | ICD-10-CM | POA: Diagnosis not present

## 2019-03-25 DIAGNOSIS — Z8262 Family history of osteoporosis: Secondary | ICD-10-CM | POA: Insufficient documentation

## 2019-03-25 DIAGNOSIS — Z85828 Personal history of other malignant neoplasm of skin: Secondary | ICD-10-CM | POA: Insufficient documentation

## 2019-03-25 DIAGNOSIS — Z808 Family history of malignant neoplasm of other organs or systems: Secondary | ICD-10-CM | POA: Insufficient documentation

## 2019-03-25 DIAGNOSIS — Z9011 Acquired absence of right breast and nipple: Secondary | ICD-10-CM

## 2019-03-25 DIAGNOSIS — R0602 Shortness of breath: Secondary | ICD-10-CM | POA: Diagnosis not present

## 2019-03-25 DIAGNOSIS — R5383 Other fatigue: Secondary | ICD-10-CM | POA: Insufficient documentation

## 2019-03-25 DIAGNOSIS — Z905 Acquired absence of kidney: Secondary | ICD-10-CM

## 2019-03-25 DIAGNOSIS — Z8249 Family history of ischemic heart disease and other diseases of the circulatory system: Secondary | ICD-10-CM

## 2019-03-25 DIAGNOSIS — D649 Anemia, unspecified: Secondary | ICD-10-CM | POA: Diagnosis not present

## 2019-03-25 DIAGNOSIS — Z800 Family history of malignant neoplasm of digestive organs: Secondary | ICD-10-CM | POA: Diagnosis not present

## 2019-03-25 DIAGNOSIS — C50511 Malignant neoplasm of lower-outer quadrant of right female breast: Secondary | ICD-10-CM | POA: Insufficient documentation

## 2019-03-25 DIAGNOSIS — G629 Polyneuropathy, unspecified: Secondary | ICD-10-CM | POA: Insufficient documentation

## 2019-03-25 LAB — CBC WITH DIFFERENTIAL/PLATELET
Abs Immature Granulocytes: 0.26 10*3/uL — ABNORMAL HIGH (ref 0.00–0.07)
Basophils Absolute: 0.1 10*3/uL (ref 0.0–0.1)
Basophils Relative: 0 %
Eosinophils Absolute: 0.5 10*3/uL (ref 0.0–0.5)
Eosinophils Relative: 1 %
HCT: 40.6 % (ref 36.0–46.0)
Hemoglobin: 12.2 g/dL (ref 12.0–15.0)
Immature Granulocytes: 0 %
Lymphocytes Relative: 89 %
Lymphs Abs: 75.6 10*3/uL — ABNORMAL HIGH (ref 0.7–4.0)
MCH: 27 pg (ref 26.0–34.0)
MCHC: 30 g/dL (ref 30.0–36.0)
MCV: 89.8 fL (ref 80.0–100.0)
Monocytes Absolute: 3.2 10*3/uL — ABNORMAL HIGH (ref 0.1–1.0)
Monocytes Relative: 4 %
Neutro Abs: 5.1 10*3/uL (ref 1.7–7.7)
Neutrophils Relative %: 6 %
Platelets: 259 10*3/uL (ref 150–400)
RBC: 4.52 MIL/uL (ref 3.87–5.11)
RDW: 14.9 % (ref 11.5–15.5)
WBC: 84.8 10*3/uL (ref 4.0–10.5)
nRBC: 0 % (ref 0.0–0.2)

## 2019-03-25 LAB — LACTATE DEHYDROGENASE: LDH: 308 U/L — ABNORMAL HIGH (ref 98–192)

## 2019-03-25 MED ORDER — ALLOPURINOL 300 MG PO TABS: 300.0000 mg | ORAL_TABLET | Freq: Every day | ORAL | 4 refills | Status: DC

## 2019-03-25 MED ORDER — IBRUTINIB 280 MG PO TABS: 280.0000 mg | ORAL_TABLET | Freq: Every morning | ORAL | 4 refills | Status: DC

## 2019-03-25 NOTE — Telephone Encounter (Signed)
Oral Oncology Patient Advocate Encounter  Received notification from [facility] that prior authorization for Imbruvica is required.  PA submitted on CoverMyMeds Key [id number] Status is pending  Oral Oncology Clinic will continue to follow.  [place] Patient [place] Phone [phone] Fax [phone] 03/25/2019    4:01 PM

## 2019-03-25 NOTE — Telephone Encounter (Signed)
Oral Oncology Patient Advocate Encounter  Prior Authorization for [name] [name] has been approved.    PA# [id number] Effective dates: 03/25/19 through 03/24/20  Oral Oncology Clinic will continue to follow.   [name] Phone [phone] Fax [phone] 03/25/2019    4:24 PM

## 2019-03-25 NOTE — Telephone Encounter (Signed)
Oral Oncology Pharmacist Encounter  Received new prescription for imbruvica (ibrutinib) for the treatment of chronic lymphocytic leukemia, planned duration until disease progression or unacceptable toxicity.  Patient also with a history of breast cancer, previously treated. She has been on observation only for her CLL. Patient's lymphocyte count is now markedly increased and is under evaluation to start treatment with ibrutinib. This is planned to be initiated at reduced dose of 280mg  once daily for increased tolerance.  Labs from Epic assessed No recent assessment of renal or hepatic function, this has been ordered for next lab appointment BPs in Epic reviewed, WNL, will continue to be monitored No other cardiac history noted on past medical history  Current medication list in Epic reviewed, no significant DDIs with Imbruvica identified:  There is a category C interaction with ibrutinib and duloxetine due to antiplatelet properties of both medications. Platelet counts remains WNL, no other risk for bleeding. Patient will be counseled about increased risk of bruising and bleeding associated with ibrutinib use. No change to current therapy is indicated at this time.  Prescription has been e-scribed to the [facility] for benefits analysis and approval.  Oral Oncology Clinic will continue to follow for insurance authorization, copayment issues, initial counseling and start date.  Johny Drilling, PharmD, BCPS, BCOP  03/25/2019 2:59 PM Oral Oncology Clinic [phone]

## 2019-03-26 LAB — BETA 2 MICROGLOBULIN, SERUM: Beta-2 Microglobulin: 5.2 mg/L — ABNORMAL HIGH (ref 0.6–2.4)

## 2019-03-28 ENCOUNTER — Telehealth: Payer: Self-pay | Admitting: *Deleted

## 2019-03-28 NOTE — Telephone Encounter (Signed)
"  [name] [name] [place] [phone]).  Friday Dr. Jana Hakim said he needs to see me in six weeks after Dr. Shelia Media called about my lab work.  Do I need lab in August?"

## 2019-03-29 ENCOUNTER — Telehealth: Payer: Self-pay

## 2019-03-29 ENCOUNTER — Telehealth: Payer: Self-pay | Admitting: *Deleted

## 2019-03-29 ENCOUNTER — Telehealth: Payer: Self-pay | Admitting: Oncology

## 2019-03-29 MED FILL — IMBRUVICA 280 MG TAB: 280 | 28 days supply | Qty: 28 | Fill #0

## 2019-03-29 NOTE — Telephone Encounter (Signed)
"  [name] [name] [phone]).  Message left yesterday.  Please call back, I need to cancel two appointments after seeing Dr. Jana Hakim Friday, the next follow up is now moved up to September."

## 2019-03-29 NOTE — Telephone Encounter (Signed)
I talk with patient regarding schedule  

## 2019-03-29 NOTE — Telephone Encounter (Signed)
Connected with [name].    1. "Dr. Jana Hakim ordered two new medications he said would be mailed to me I have not received yet."   Allopurinol received eRx at [facility] in [place].  Ibrutinib pending authorization through insurance per [employer].      2. "I also was to have appointment with a cardiologist scheduled."   Cardiology referral noted for Dr. Percival Spanish, [facility].  [facility]ax[facility] to [facility] at [facility] location for this provider.       3. "Two appointments there in August are no longer needed I see him in September."    Notified of scheduling order sent by provider will cancel previously scheduled appointments.

## 2019-03-29 NOTE — Telephone Encounter (Signed)
Oral Chemotherapy Pharmacist Encounter   I spoke with patient and husband, [name], over the phone for overview of: Imbruvica (ibrutinib) for the treatment of chronic lymphocytic leukemia, planned duration until disease progression or unacceptable toxicity.   Counseled patient on administration, dosing, side effects, monitoring, drug-food interactions, safe handling, storage, and disposal.  Patient will take Imbruvica 280mg  tablets, 1 tablet (280mg ) by mouth once daily.  Patient will take Imbruvica at approximately the same time each day with a full glass of water and maintain adequate hydration throughout the day.  Patient knows to avoid grapefruit or grapefruit juice while on therapy with Imbruvica.  Imbruvica start date: 03/31/2019  Adverse effects include but are not limited to: bruising, decreased blood counts, N/V, diarrhea, musculoskeletal pain, arthralgias, peripheral edema, and hemorrhage.    Patient has anti diarrheal on hand which she already takes once daily. She will alert the office of 4 or more loose stools above baseline.  Reviewed with patient importance of keeping a medication schedule and plan for any missed doses.  Medication reconciliation performed and medication/allergy list updated. Patient counseled about increased risk of bruising/bleeding with concurrent Imbruvica and Cymbalta. She will alert the office with any issues.  Insurance authorization for [name] has been obtained. Test claim at the pharmacy revealed copayment ~$700 for 1st fill of Imbruvica. Oral oncology patient advocate was successful in securing foundation copayment grant to cover out of pocket expenses for Imbruvica at the pharmacy. Copayment now $0 [place]19 to deliver to patient's home on 8/5.  Patient informed the pharmacy will reach out 5-7 days prior to needing next fill of Imbruvica to coordinate continued medication acquisition to  prevent break in therapy.  All questions answered.  Mr. and [name] voiced understanding and appreciation.   Patient knows to call the office with questions or concerns.  [name] [name], PharmD, BCPS, BCOP  03/29/2019   12:17 PM Oral Oncology Clinic [phone]

## 2019-03-29 NOTE — Telephone Encounter (Signed)
Oral Oncology Patient Advocate Encounter  Was successful in securing patient a $8000 grant from [employer] to provide copayment coverage for Imbruvica. This will keep the out of pocket expense at $0.   I have spoken with the patient..   The billing information is as follows and has been shared with [place].   Member ID: [id number] Group ID: [id number] RxBin: [id number] Dates of Eligibility: 02/27/19 through 02/26/20  [place] Patient [place] Phone [phone] Fax [phone] 03/29/2019    12:30 PM

## 2019-03-30 ENCOUNTER — Other Ambulatory Visit: Payer: Self-pay | Admitting: Oncology

## 2019-03-30 NOTE — Telephone Encounter (Signed)
Oral Oncology Patient Advocate Encounter  Confirmed with [place] that [name] was shipped on 03/29/19 with a $0 copay using [employer].   [place] Patient [place] Phone [phone] Fax [phone] 03/30/2019   8:19 AM

## 2019-04-04 ENCOUNTER — Telehealth: Payer: Self-pay

## 2019-04-04 DIAGNOSIS — Z1231 Encounter for screening mammogram for malignant neoplasm of breast: Secondary | ICD-10-CM | POA: Diagnosis not present

## 2019-04-04 DIAGNOSIS — Z853 Personal history of malignant neoplasm of breast: Secondary | ICD-10-CM | POA: Diagnosis not present

## 2019-04-04 NOTE — Telephone Encounter (Signed)
NO NOTES,JUST REFERRAL FROM [facility]CER CENTER  SENT REFERRAL TO SCHEDULING

## 2019-04-05 ENCOUNTER — Other Ambulatory Visit: Payer: Medicare Other

## 2019-04-12 ENCOUNTER — Ambulatory Visit: Payer: Medicare Other | Admitting: Oncology

## 2019-04-22 ENCOUNTER — Ambulatory Visit: Payer: Medicare Other | Admitting: Cardiology

## 2019-04-25 ENCOUNTER — Encounter: Payer: Self-pay | Admitting: Cardiology

## 2019-04-25 ENCOUNTER — Other Ambulatory Visit: Payer: Self-pay

## 2019-04-25 ENCOUNTER — Ambulatory Visit (INDEPENDENT_AMBULATORY_CARE_PROVIDER_SITE_OTHER): Payer: Medicare Other | Admitting: Cardiology

## 2019-04-25 VITALS — BP 110/70 | HR 79 | Ht 64.75 in | Wt 201.0 lb

## 2019-04-25 DIAGNOSIS — I951 Orthostatic hypotension: Secondary | ICD-10-CM

## 2019-04-25 DIAGNOSIS — R0789 Other chest pain: Secondary | ICD-10-CM | POA: Diagnosis not present

## 2019-04-25 DIAGNOSIS — R0602 Shortness of breath: Secondary | ICD-10-CM | POA: Diagnosis not present

## 2019-04-25 DIAGNOSIS — R079 Chest pain, unspecified: Secondary | ICD-10-CM | POA: Diagnosis not present

## 2019-04-25 MED ORDER — METOPROLOL TARTRATE 100 MG PO TABS: ORAL_TABLET | ORAL | 0 refills | Status: DC

## 2019-04-25 NOTE — Progress Notes (Signed)
Cardiology Office Note    Date:  04/25/2019   ID:  [name] [name], DOB [birth year], MRN [id number]  PCP:  Deland Pretty, MD  Cardiologist:   Candee Furbish, MD     History of Present Illness:  [name] [name] is a 73 y.o. female former patient of Dr. Ron Parker with history of breast cancer,and radiation chemotherapy 2017, shortness of breath, orthostatic hypotension with several echoes in the past showing stable normal left ventricular function. No chest pain. No recent shortness of breath. No syncope. She's had mild sinus tachycardia at rest. Anxiety in the past.  She was in a skilled nursing facility after multilevel thoracic decompression and fusion.  Overall she is doing quite well from a cardiac standpoint. No complaints no chest pain, no shortness of breath, no syncope. She was taken off of the metoprolol. This is fine.  Has chronic lymphocytic leukemia-white count usually stable at around 31,000.  Has hypothyroidism as well. On Synthroid.  04/25/2019 - Here for the follow-up of prior orthostatic hypotension, breast cancer, ongoing shortness of breath, occasional atypical chest pain.  Dr. Jana Hakim.  In review of Dr. Virgie Dad note from 03/25/2019 she had been having shortness of breath even with mild activity.  Fatigue.  Occasional cough.  Neuropathy in her legs.  There is not a simple explanation for her shortness of breath.  Could be deconditioning.  Notes, she has seen Dr. Melvyn Novas in 2017 for shortness of breath. SOB with activity. Not all the time. If go up to bathroom at night. Severe Chest pain as well off and on, can happen at night, scare her. Lethargic. Had back surgery 7 hours, 3 years and still has pain.   In 2017 she also saw Dr. Einar Gip who performed echocardiogram that showed normal pump function without any significant valvular abnormalities.  She also had a nuclear stress test on 10/12/2015 that showed no evidence of ischemia.    Past Medical History:  Diagnosis Date  .  Alopecia   . Anxiety   . Arthritis    "spine" (07/28/2013)  . Breast cancer ([place]) 1997; 2014   right  . CAP (community acquired pneumonia)    admission 06-04-2013, failed outpatient  . Chronic back pain    "lower back and upper neck" (07/28/2013)  . CLL (chronic lymphocytic leukemia) ([place]) 05/2013  . DDD (degenerative disc disease)   . 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 ear   . Depression   . Diverticulosis   . Elevated LFTs   . Fibromyalgia 1978  . GERD (gastroesophageal reflux disease)   . Hematuria   . History of syncope    episode 2008--  no recurrence since  . HLD (hyperlipidemia)   . Hypothyroidism   . Kidney stones 2014  . Plantar fasciitis   . Ruptured disk    one in neck and two in back  . S/P chemotherapy, time since greater than 12 weeks   . Sinus tachycardia    mild resting  . Skin cancer   . Stool incontinence    "at times recently"     Past Surgical History:  Procedure Laterality Date  . ANTERIOR LATERAL LUMBAR FUSION 4 LEVELS Left 04/25/2016   Procedure: LEFT LUMBAR ONE-TWO, LUMBAR TWO-THREE, LUMBAR THREE-FOUR, LUMBAR FOUR-FIVE ANTERIOR LATERAL LUMBAR FUSION;  Surgeon: Earnie Larsson, MD;  Location: [facility];  Service: Neurosurgery;  Laterality: Left;  . APPENDECTOMY  1997  . APPLICATION OF ROBOTIC ASSISTANCE FOR SPINAL PROCEDURE N/A 04/06/2017   Procedure: APPLICATION OF ROBOTIC ASSISTANCE FOR SPINAL  PROCEDURE;  Surgeon: Earnie Larsson, MD;  Location: [facility];  Service: Neurosurgery;  Laterality: N/A;  . BREAST BIOPSY Right 2014  . BREAST LUMPECTOMY Right 1997  . BREAST LUMPECTOMY WITH AXILLARY LYMPH NODE DISSECTION Right 1997  . CATARACT EXTRACTION, BILATERAL  2019   Dr. Kathlen Mody  . CYSTOSCOPY WITH RETROGRADE PYELOGRAM, URETEROSCOPY AND STENT PLACEMENT Bilateral 06/17/2013   Procedure: CYSTOSCOPY WITH RETROGRADE PYELOGRAM, URETEROSCOPY AND LEFT DOUBLE  J STENT PLACEMENT RIGHT URETERAL HOLMIIUM LASER AND DOUBLE J STENT ;  Surgeon: Hanley Ben, MD;  Location: [facility];  Service: Urology;  Laterality: Bilateral;  . HOLMIUM LASER APPLICATION Bilateral [id number]   Procedure: HOLMIUM LASER APPLICATION;  Surgeon: Hanley Ben, MD;  Location: [facility];  Service: Urology;  Laterality: Bilateral;  . LITHOTRIPSY Left 06/2013  . LUMBAR EPIDURAL INJECTION     has had 7 injections  . PORT-A-CATH REMOVAL Left 10/03/2014   Procedure: REMOVAL PORT-A-CATH;  Surgeon: Donnie Mesa, MD;  Location: [facility];  Service: General;  Laterality: Left;  . PORTACATH PLACEMENT Left 07/28/2013   Procedure: ATTEMPTED INSERTION PORT-A-CATH;  Surgeon: Imogene Burn. Georgette Dover, MD;  Location: [facility];  Service: General;  Laterality: Left;  . POSTERIOR LUMBAR FUSION 4 LEVEL N/A 04/25/2016   Procedure: LUMBAR FIVE-SACRAL ONE POSTERIOR LUMBAR INTERBODY FUSION, THORACIC NINE-SACRAL ONE POSTERIOR LATERAL ARTHRODESIS WITH PEDICLE SCREWS;  Surgeon: Earnie Larsson, MD;  Location: [facility];  Service: Neurosurgery;  Laterality: N/A;  . RETINAL DETACHMENT SURGERY Right 2013  . ROBOTIC ASSITED PARTIAL NEPHRECTOMY Right 02/05/2015   Procedure: ROBOTIC ASSITED PARTIAL NEPHRECTOMY;  Surgeon: Raynelle Bring, MD;  Location: [facility];  Service: Urology;  Laterality: Right;  . SIMPLE MASTECTOMY WITH AXILLARY SENTINEL NODE BIOPSY Right 07/28/2013   Procedure: RIGHT TOTAL  MASTECTOMY;  Surgeon: Imogene Burn. Georgette Dover, MD;  Location: [facility];  Service: General;  Laterality: Right;  . TONSILLECTOMY  AGE 33  . TOTAL ABDOMINAL HYSTERECTOMY W/ BILATERAL SALPINGOOPHORECTOMY  1997    Current Medications: Outpatient Medications Prior to Visit  Medication Sig Dispense Refill  . allopurinol (ZYLOPRIM) 300 MG tablet Take 1 tablet (300 mg total) by mouth daily. 90 tablet 4  . ALPRAZolam (XANAX) 0.5 MG tablet Take 0.5 mg by mouth 2 (two) times daily.     Marland Kitchen amitriptyline (ELAVIL) 150 MG tablet Take 150 mg by mouth at bedtime.    . DULoxetine (CYMBALTA) 30 MG capsule Take 90 mg by mouth daily after  breakfast.     . gabapentin (NEURONTIN) 300 MG capsule Take 300mg s by mouth daily 90 capsule 4  . Ibrutinib 280 MG TABS Take 280 mg by mouth every morning. Take with a glass of water at approximately the same time each day. 90 tablet 4  . levothyroxine (SYNTHROID, LEVOTHROID) 88 MCG tablet Take 88 mcg by mouth daily before breakfast.   4  . omeprazole (PRILOSEC) 40 MG capsule TAKE 1 CAPSULE(40 MG) BY MOUTH DAILY AFTER BREAKFAST 90 capsule 0  . oxyCODONE-acetaminophen (PERCOCET) 5-325 MG tablet Take 1-2 tablets by mouth every 4 (four) hours as needed for severe pain. 60 tablet 0  . REXULTI 2 MG TABS Take 2 mg by mouth at bedtime.   1   No facility-administered medications prior to visit.      Allergies:   Lasix [furosemide], Lithium, Sulfa antibiotics, Trazodone and nefazodone, and Lyrica [pregabalin]   Social History   Socioeconomic History  . Marital status: Married    Spouse name: Arnette Norris  . Number of children:  1  . Years of education: Not on file  . Highest education level: Not on file  Occupational History  . Occupation: homemaker  Social Needs  . Financial resource strain: Not on file  . Food insecurity    Worry: Not on file    Inability: Not on file  . Transportation needs    Medical: Not on file    Non-medical: Not on file  Tobacco Use  . Smoking status: Never Smoker  . Smokeless tobacco: Never Used  Substance and Sexual Activity  . Alcohol use: No    Comment: 07/28/2013 "no alcohol since 1994; never had problem w/it"  . Drug use: No  . Sexual activity: Not Currently    Partners: Male    Birth control/protection: Surgical    Comment: TAH/BSO  Lifestyle  . Physical activity    Days per week: Not on file    Minutes per session: Not on file  . Stress: Not on file  Relationships  . Social Herbalist on phone: Not on file    Gets together: Not on file    Attends religious service: Not on file    Active member of club or organization: Not on file     Attends meetings of clubs or organizations: Not on file    Relationship status: Not on file  Other Topics Concern  . Not on file  Social History Narrative  . Not on file     Family History:  The patient's family history includes Bipolar disorder in her maternal aunt; Brain cancer in her paternal grandmother; Colon cancer (age of onset: 63) in her maternal aunt; Dementia in her mother; Diabetes in her maternal aunt, maternal grandfather, and mother; Heart disease in her maternal grandfather; Osteoporosis in her mother; Prostate cancer (age of onset: 80) in her father; Stomach cancer in her paternal uncle; Stroke in her maternal aunt.   ROS:   Please see the history of present illness.    ROS All other systems reviewed and are negative.   PHYSICAL EXAM:   VS:  BP 110/70   Pulse 79   Ht 5' 4.75" (1.645 m)   Wt 201 lb (91.2 kg)   LMP 08/26/1995   SpO2 93%   BMI 33.71 kg/m    GEN: Well nourished, well developed, in no acute distress  HEENT: normal  Neck: no JVD, carotid bruits, or masses Cardiac: RRR; no murmurs, rubs, or gallops,no edema  Respiratory:  clear to auscultation bilaterally, normal work of breathing GI: soft, nontender, nondistended, + BS MS: no deformity or atrophy  Skin: warm and dry, no rash Neuro:  Alert and Oriented x 3, Strength and sensation are intact Psych: euthymic mood, full affect  Wt Readings from Last 3 Encounters:  04/25/19 201 lb (91.2 kg)  03/25/19 203 lb 4.8 oz (92.2 kg)  09/24/18 199 lb (90.3 kg)      Studies/Labs Reviewed:   EKG:  EKG is ordered today.  The ekg ordered today demonstrates 09/24/2018-sinus rhythm with PACs nonspecific T wave changes.  09/24/16-sinus rhythm, 99, no other abnormalities.  Recent Labs: 08/22/2018: ALT 17 08/23/2018: BUN 14; Creatinine, Ser 0.97; Potassium 4.4; Sodium 140 03/25/2019: Hemoglobin 12.2; Platelets 259   Lipid Panel No results found for: CHOL, TRIG, HDL, CHOLHDL, VLDL, LDLCALC, LDLDIRECT  Additional  studies/ records that were reviewed today include: Prior lab work, EKG, office notes reviewed.  Echocardiogram 08/28/14:  - Left ventricle: The cavity size was normal. Systolic function was normal. The  estimated ejection fraction was in the range of 55% to 60%. Wall motion was normal; there were no regional wall motion abnormalities.  Impressions:  - Global LV longitudinal strain -19.7%  ASSESSMENT:    1. SOB (shortness of breath)   2. Orthostatic hypotension   3. Atypical chest pain   4. Chest pain, unspecified type   5. Shortness of breath      PLAN:  In order of problems listed above:  Breast cancer  - Prior chemotherapy. LV function normal by echocardiogram  Orthostatic hypotension  - Improved. Hydration.  Dyspnea/chest pain  - Improved. Previous cardiopulmonary exercise test demonstrated deconditioning.  Since she has had unremarkable stress test in the past as well as unremarkable echocardiogram, it may be more helpful to elucidate a cardiovascular issue with a coronary CT scan to fully evaluate her coronary arteries and to also evaluate her pericardium for any signs of calcification or constriction.  I will also repeat her echocardiogram to ensure that she is not developed any cardiomyopathy.  Certainly her chest pain could be related to her upper back discomfort.  History of sinus tachycardia  - Benign. She is off of the metoprolol. Her blood pressure is excellent. Heart rate 79 bpm. Stable.  We will give her a one-time dose of metoprolol for her CT scan.  100 mg.  When sitting up she did have some sinus arrhythmia usually in groups of 3 or 4 beats.  P waves look the same as sinus P waves.  When laying down, I do not see this pattern occurring.  CLL  - White count around 31,000. Stable.  We will go ahead and get a thorough cardiac evaluation with echocardiogram and cardiac CT.  We will let her know the results.  PRN follow-up  Medication Adjustments/Labs and  Tests Ordered: Current medicines are reviewed at length with the patient today.  Concerns regarding medicines are outlined above.  Medication changes, Labs and Tests ordered today are listed in the Patient Instructions below. Patient Instructions  Medication Instructions:  Your physician recommends that you continue on your current medications as directed. Please refer to the Current Medication list given to you today.  If you need a refill on your cardiac medications before your next appointment, please call your pharmacy.   Lab work: Your physician recommends that you return for lab work in: 1 week prior to [place]   If you have labs (blood work) drawn today and your tests are completely normal, you will receive your results only by: Marland Kitchen MyChart Message (if you have MyChart) OR . A paper copy in the mail If you have any lab test that is abnormal or we need to change your treatment, we will call you to review the results.  Testing/Procedures: Your physician has requested that you have an echocardiogram. Echocardiography is a painless test that uses sound waves to create images of your heart. It provides your doctor with information about the size and shape of your heart and how well your heart's chambers and valves are working. This procedure takes approximately one hour. There are no restrictions for this procedure.  Follow-Up: At [facility], you and your health needs are our priority.  As part of our continuing mission to provide you with exceptional heart care, we have created designated Provider Care Teams.  These Care Teams include your primary Cardiologist (physician) and Advanced Practice Providers (APPs -  Physician Assistants and Nurse Practitioners) who all work together to provide you with the care  you need, when you need it. You will need a follow up appointment in  As Needed.  Please call our office 2 months in advance to schedule this appointment.  You may see Candee Furbish, MD  or one of the following Advanced Practice Providers on your designated Care Team:   Truitt Merle, NP Cecilie Kicks, NP . Kathyrn Drown, NP  Any Other Special Instructions Will Be Listed Below (If Applicable).   Echocardiogram An echocardiogram is a procedure that uses painless sound waves (ultrasound) to produce an image of the heart. Images from an echocardiogram can provide important information about:  Signs of coronary artery disease (CAD).  Aneurysm detection. An aneurysm is a weak or damaged part of an artery wall that bulges out from the normal force of blood pumping through the body.  Heart size and shape. Changes in the size or shape of the heart can be associated with certain conditions, including heart failure, aneurysm, and CAD.  Heart muscle function.  Heart valve function.  Signs of a past heart attack.  Fluid buildup around the heart.  Thickening of the heart muscle.  A tumor or infectious growth around the heart valves. Tell a health care provider about:  Any allergies you have.  All medicines you are taking, including vitamins, herbs, eye drops, creams, and over-the-counter medicines.  Any blood disorders you have.  Any surgeries you have had.  Any medical conditions you have.  Whether you are pregnant or may be pregnant. What are the risks? Generally, this is a safe procedure. However, problems may occur, including:  Allergic reaction to dye (contrast) that may be used during the procedure. What happens before the procedure? No specific preparation is needed. You may eat and drink normally. What happens during the procedure?   An IV tube may be inserted into one of your veins.  You may receive contrast through this tube. A contrast is an injection that improves the quality of the pictures from your heart.  A gel will be applied to your chest.  A wand-like tool (transducer) will be moved over your chest. The gel will help to transmit the sound  waves from the transducer.  The sound waves will harmlessly bounce off of your heart to allow the heart images to be captured in real-time motion. The images will be recorded on a computer. The procedure may vary among health care providers and hospitals. What happens after the procedure?  You may return to your normal, everyday life, including diet, activities, and medicines, unless your health care provider tells you not to do that. Summary  An echocardiogram is a procedure that uses painless sound waves (ultrasound) to produce an image of the heart.  Images from an echocardiogram can provide important information about the size and shape of your heart, heart muscle function, heart valve function, and fluid buildup around your heart.  You do not need to do anything to prepare before this procedure. You may eat and drink normally.  After the echocardiogram is completed, you may return to your normal, everyday life, unless your health care provider tells you not to do that. This information is not intended to replace advice given to you by your health care provider. Make sure you discuss any questions you have with your health care provider. Document Released: 08/08/2000 Document Revised: 12/02/2018 Document Reviewed: 09/13/2016 Elsevier Patient Education  2020 [place] cardiac CT will be scheduled at one of the below locations:   [facility] [address]  [place], [place] [address] [phone]  Please arrive at the [facility] main entrance of [facility] 30-45 minutes prior to test start time. Proceed to the [facility] Radiology Department (first floor) to check-in and test prep.  Please follow these instructions carefully (unless otherwise directed):   On the Night Before the Test: . Be sure to Drink plenty of water. . Do not consume any caffeinated/decaffeinated beverages or chocolate 12 hours prior to your test. . Do not take any  antihistamines 12 hours prior to your test.  On the Day of the Test: . Drink plenty of water. Do not drink any water within one hour of the test. . Do not eat any food 4 hours prior to the test. . You may take your regular medications prior to the test.  . Take metoprolol (Lopressor) two hours prior to test.  . FEMALES- please wear underwire-free bra if available      After the Test: . Drink plenty of water. . After receiving IV contrast, you may experience a mild flushed feeling. This is normal. . On occasion, you may experience a mild rash up to 24 hours after the test. This is not dangerous. If this occurs, you can take Benadryl 25 mg and increase your fluid intake. . If you experience trouble breathing, this can be serious. If it is severe call 911 IMMEDIATELY. If it is mild, please call our office. .     Please contact the cardiac imaging nurse navigator should you have any questions/concerns Marchia Bond, RN Navigator Cardiac Imaging [facility] [phone] Office  [phone] Cell          Signed, Candee Furbish, MD  04/25/2019 4:12 PM    [address], [place], [place]  [address] Phone: [phone]; Fax: [phone]

## 2019-04-25 NOTE — Patient Instructions (Signed)
Medication Instructions:  Your physician recommends that you continue on your current medications as directed. Please refer to the Current Medication list given to you today.  If you need a refill on your cardiac medications before your next appointment, please call your pharmacy.   Lab work: Your physician recommends that you return for lab work in: 1 week prior to [place]   If you have labs (blood work) drawn today and your tests are completely normal, you will receive your results only by: Marland Kitchen MyChart Message (if you have MyChart) OR . A paper copy in the mail If you have any lab test that is abnormal or we need to change your treatment, we will call you to review the results.  Testing/Procedures: Your physician has requested that you have an echocardiogram. Echocardiography is a painless test that uses sound waves to create images of your heart. It provides your doctor with information about the size and shape of your heart and how well your heart's chambers and valves are working. This procedure takes approximately one hour. There are no restrictions for this procedure.  Follow-Up: At [facility], you and your health needs are our priority.  As part of our continuing mission to provide you with exceptional heart care, we have created designated Provider Care Teams.  These Care Teams include your primary Cardiologist (physician) and Advanced Practice Providers (APPs -  Physician Assistants and Nurse Practitioners) who all work together to provide you with the care you need, when you need it. You will need a follow up appointment in  As Needed.  Please call our office 2 months in advance to schedule this appointment.  You may see Candee Furbish, MD or one of the following Advanced Practice Providers on your designated Care Team:   Truitt Merle, NP Cecilie Kicks, NP . Kathyrn Drown, NP  Any Other Special Instructions Will Be Listed Below (If Applicable).   Echocardiogram An  echocardiogram is a procedure that uses painless sound waves (ultrasound) to produce an image of the heart. Images from an echocardiogram can provide important information about:  Signs of coronary artery disease (CAD).  Aneurysm detection. An aneurysm is a weak or damaged part of an artery wall that bulges out from the normal force of blood pumping through the body.  Heart size and shape. Changes in the size or shape of the heart can be associated with certain conditions, including heart failure, aneurysm, and CAD.  Heart muscle function.  Heart valve function.  Signs of a past heart attack.  Fluid buildup around the heart.  Thickening of the heart muscle.  A tumor or infectious growth around the heart valves. Tell a health care provider about:  Any allergies you have.  All medicines you are taking, including vitamins, herbs, eye drops, creams, and over-the-counter medicines.  Any blood disorders you have.  Any surgeries you have had.  Any medical conditions you have.  Whether you are pregnant or may be pregnant. What are the risks? Generally, this is a safe procedure. However, problems may occur, including:  Allergic reaction to dye (contrast) that may be used during the procedure. What happens before the procedure? No specific preparation is needed. You may eat and drink normally. What happens during the procedure?   An IV tube may be inserted into one of your veins.  You may receive contrast through this tube. A contrast is an injection that improves the quality of the pictures from your heart.  A gel will be applied  to your chest.  A wand-like tool (transducer) will be moved over your chest. The gel will help to transmit the sound waves from the transducer.  The sound waves will harmlessly bounce off of your heart to allow the heart images to be captured in real-time motion. The images will be recorded on a computer. The procedure may vary among health care  providers and hospitals. What happens after the procedure?  You may return to your normal, everyday life, including diet, activities, and medicines, unless your health care provider tells you not to do that. Summary  An echocardiogram is a procedure that uses painless sound waves (ultrasound) to produce an image of the heart.  Images from an echocardiogram can provide important information about the size and shape of your heart, heart muscle function, heart valve function, and fluid buildup around your heart.  You do not need to do anything to prepare before this procedure. You may eat and drink normally.  After the echocardiogram is completed, you may return to your normal, everyday life, unless your health care provider tells you not to do that. This information is not intended to replace advice given to you by your health care provider. Make sure you discuss any questions you have with your health care provider. Document Released: 08/08/2000 Document Revised: 12/02/2018 Document Reviewed: 09/13/2016 Elsevier Patient Education  2020 [place] cardiac CT will be scheduled at one of the below locations:   [facility] [address] [place], [place] [address] [phone]  Please arrive at the [facility] main entrance of [facility] 30-45 minutes prior to test start time. Proceed to the [facility] Radiology Department (first floor) to check-in and test prep.  Please follow these instructions carefully (unless otherwise directed):   On the Night Before the Test: . Be sure to Drink plenty of water. . Do not consume any caffeinated/decaffeinated beverages or chocolate 12 hours prior to your test. . Do not take any antihistamines 12 hours prior to your test.  On the Day of the Test: . Drink plenty of water. Do not drink any water within one hour of the test. . Do not eat any food 4 hours prior to the test. . You may take your regular medications  prior to the test.  . Take metoprolol (Lopressor) two hours prior to test.  . FEMALES- please wear underwire-free bra if available      After the Test: . Drink plenty of water. . After receiving IV contrast, you may experience a mild flushed feeling. This is normal. . On occasion, you may experience a mild rash up to 24 hours after the test. This is not dangerous. If this occurs, you can take Benadryl 25 mg and increase your fluid intake. . If you experience trouble breathing, this can be serious. If it is severe call 911 IMMEDIATELY. If it is mild, please call our office. .     Please contact the cardiac imaging nurse navigator should you have any questions/concerns Marchia Bond, RN Navigator Cardiac Imaging [place] and V[facility] Services [phone] Office  [phone] Cell

## 2019-04-26 ENCOUNTER — Telehealth: Payer: Self-pay

## 2019-04-26 MED FILL — IMBRUVICA 280 MG TAB: 280 | 28 days supply | Qty: 28 | Fill #1

## 2019-04-26 NOTE — Telephone Encounter (Signed)
Oral Oncology Patient Advocate Encounter  [name] has attempted to reach the patient 3 times to refill her Imbruvica with no success.  I called the patient and she does need the refill as she is out of medicine at this time. I scheduled with the pharmacy for her Imbruvica to be filled and shipped today for delivery tomorrow, 9/2.  The patient verbalized understanding and great appreciation.  [name] [name] Patient [place] Phone [phone] Fax [phone] 04/26/2019   1:41 PM

## 2019-05-03 ENCOUNTER — Other Ambulatory Visit: Payer: Self-pay

## 2019-05-03 ENCOUNTER — Ambulatory Visit (HOSPITAL_COMMUNITY): Payer: Medicare Other | Attending: Cardiovascular Disease

## 2019-05-03 DIAGNOSIS — R0789 Other chest pain: Secondary | ICD-10-CM | POA: Insufficient documentation

## 2019-05-03 DIAGNOSIS — R0602 Shortness of breath: Secondary | ICD-10-CM | POA: Diagnosis not present

## 2019-05-04 ENCOUNTER — Telehealth: Payer: Self-pay

## 2019-05-04 DIAGNOSIS — F331 Major depressive disorder, recurrent, moderate: Secondary | ICD-10-CM | POA: Diagnosis not present

## 2019-05-04 DIAGNOSIS — F411 Generalized anxiety disorder: Secondary | ICD-10-CM | POA: Diagnosis not present

## 2019-05-04 NOTE — Telephone Encounter (Signed)
Notes recorded by Frederik Schmidt, RN on 05/04/2019 at 8:06 AM EDT  The patient has been notified of the Echo result and verbalized understanding. All questions (if any) were answered.  Frederik Schmidt, RN 05/04/2019 8:06 AM

## 2019-05-04 NOTE — Telephone Encounter (Signed)
-----   Message from Jerline Pain, MD sent at 05/04/2019  6:53 AM EDT ----- Normal pump function.  Overall reassuring echo.  Candee Furbish, MD

## 2019-05-06 ENCOUNTER — Other Ambulatory Visit: Payer: Self-pay

## 2019-05-06 ENCOUNTER — Inpatient Hospital Stay: Payer: Medicare Other

## 2019-05-06 ENCOUNTER — Encounter: Payer: Self-pay | Admitting: Adult Health

## 2019-05-06 ENCOUNTER — Inpatient Hospital Stay: Payer: Medicare Other | Attending: Oncology | Admitting: Adult Health

## 2019-05-06 VITALS — BP 106/62 | HR 98 | Temp 97.8°F | Resp 18 | Ht 64.0 in | Wt 199.1 lb

## 2019-05-06 DIAGNOSIS — C9110 Chronic lymphocytic leukemia of B-cell type not having achieved remission: Secondary | ICD-10-CM

## 2019-05-06 DIAGNOSIS — Z9221 Personal history of antineoplastic chemotherapy: Secondary | ICD-10-CM

## 2019-05-06 DIAGNOSIS — E785 Hyperlipidemia, unspecified: Secondary | ICD-10-CM | POA: Insufficient documentation

## 2019-05-06 DIAGNOSIS — K219 Gastro-esophageal reflux disease without esophagitis: Secondary | ICD-10-CM

## 2019-05-06 DIAGNOSIS — Z79899 Other long term (current) drug therapy: Secondary | ICD-10-CM | POA: Insufficient documentation

## 2019-05-06 DIAGNOSIS — F419 Anxiety disorder, unspecified: Secondary | ICD-10-CM | POA: Insufficient documentation

## 2019-05-06 DIAGNOSIS — D649 Anemia, unspecified: Secondary | ICD-10-CM | POA: Diagnosis not present

## 2019-05-06 DIAGNOSIS — C50511 Malignant neoplasm of lower-outer quadrant of right female breast: Secondary | ICD-10-CM

## 2019-05-06 DIAGNOSIS — Z8249 Family history of ischemic heart disease and other diseases of the circulatory system: Secondary | ICD-10-CM

## 2019-05-06 DIAGNOSIS — E039 Hypothyroidism, unspecified: Secondary | ICD-10-CM

## 2019-05-06 DIAGNOSIS — Z170 Estrogen receptor positive status [ER+]: Secondary | ICD-10-CM

## 2019-05-06 DIAGNOSIS — Z85828 Personal history of other malignant neoplasm of skin: Secondary | ICD-10-CM | POA: Diagnosis not present

## 2019-05-06 LAB — CBC WITH DIFFERENTIAL/PLATELET
Abs Immature Granulocytes: 0.1 10*3/uL — ABNORMAL HIGH (ref 0.00–0.07)
Basophils Absolute: 0.3 10*3/uL — ABNORMAL HIGH (ref 0.0–0.1)
Basophils Relative: 1 %
Eosinophils Absolute: 0.2 10*3/uL (ref 0.0–0.5)
Eosinophils Relative: 0 %
HCT: 36.1 % (ref 36.0–46.0)
Hemoglobin: 11.2 g/dL — ABNORMAL LOW (ref 12.0–15.0)
Immature Granulocytes: 0 %
Lymphocytes Relative: 87 %
Lymphs Abs: 43.5 10*3/uL — ABNORMAL HIGH (ref 0.7–4.0)
MCH: 27.9 pg (ref 26.0–34.0)
MCHC: 31 g/dL (ref 30.0–36.0)
MCV: 89.8 fL (ref 80.0–100.0)
Monocytes Absolute: 0.9 10*3/uL (ref 0.1–1.0)
Monocytes Relative: 2 %
Neutro Abs: 4.8 10*3/uL (ref 1.7–7.7)
Neutrophils Relative %: 10 %
Platelets: 213 10*3/uL (ref 150–400)
RBC: 4.02 MIL/uL (ref 3.87–5.11)
RDW: 14.6 % (ref 11.5–15.5)
WBC: 49.9 10*3/uL — ABNORMAL HIGH (ref 4.0–10.5)
nRBC: 0 % (ref 0.0–0.2)

## 2019-05-06 LAB — COMPREHENSIVE METABOLIC PANEL
ALT: 19 U/L (ref 0–44)
AST: 21 U/L (ref 15–41)
Albumin: 4.6 g/dL (ref 3.5–5.0)
Alkaline Phosphatase: 81 U/L (ref 38–126)
Anion gap: 7 (ref 5–15)
BUN: 18 mg/dL (ref 8–23)
CO2: 26 mmol/L (ref 22–32)
Calcium: 9.4 mg/dL (ref 8.9–10.3)
Chloride: 100 mmol/L (ref 98–111)
Creatinine, Ser: 1.15 mg/dL — ABNORMAL HIGH (ref 0.44–1.00)
GFR calc Af Amer: 55 mL/min — ABNORMAL LOW (ref >60)
GFR calc non Af Amer: 47 mL/min — ABNORMAL LOW (ref >60)
Glucose, Bld: 91 mg/dL (ref 70–99)
Potassium: 4.5 mmol/L (ref 3.5–5.1)
Sodium: 133 mmol/L — ABNORMAL LOW (ref 135–145)
Total Bilirubin: 0.3 mg/dL (ref 0.3–1.2)
Total Protein: 6.9 g/dL (ref 6.5–8.1)

## 2019-05-06 LAB — LACTATE DEHYDROGENASE: LDH: 147 U/L (ref 98–192)

## 2019-05-06 NOTE — Progress Notes (Signed)
ID: [name] OB: 1945/10/11  MR#: [id number]  CSN#:[id number]  Patient Care Team: Deland Pretty, MD as PCP - General (Internal Medicine) Jerline Pain, MD as PCP - Cardiology (Cardiology) Magrinat, Virgie Dad, MD as Consulting Physician (Oncology) Milus Banister, MD as Attending Physician (Gastroenterology) Megan Salon, MD as Consulting Physician (Gynecology) Donnie Mesa, MD as Consulting Physician (General Surgery) Noemi Chapel, NP as Nurse Practitioner Rod Can, MD as Consulting Physician (Orthopedic Surgery)   CHIEF COMPLAINT: Estrogen receptor positive Right Breast Cancer; chronic lymphoid leukemia  CURRENT TREATMENT: Observation   BREAST CANCER HISTORY: From the prior summary:  Neya has a history of breast cancer dating back to 1997, [name] performed a right lumpectomy and axillary lymph node dissection for what according to the patient and her family was a stage I invasive ductal breast cancer. She received radiation adjuvantly and then took tamoxifen for 5 years  In February of 2014 she had a normal screening mammography, but in November 2014 she felt a "hard lump" in her right breast. She brought this to the attention of her urologist, Dr. Izora Gala, and he set her up for bilateral diagnostic mammography with mammography at [facility] 07/13/2013. This showed her breast density to be category B. There was no mammographic abnormality noted but ultrasonography showed a 1.4 cm irregularly shaped solid mass in the right breast at the 8:00 position. This was biopsied the same day, and showed ([id number]) an invasive ductal carcinoma, grade 2, estrogen receptor 100% positive, progesterone receptor 13% positive, with an MIB-1 of 33% and HER-2 amplification with a HER-2: CEP 17 ratio of 3.19, and a HER-2 copy number percent all of 4.15.  The patient's subsequent history is as detailed below   INTERVAL HISTORY: Alene returns today for follow-up and treatment  of her estrogen receptor positive breast cancer.   She continues under observation.  She completed her anastrozole in 11/2018. She tolerated this well.   She is also here for f/u of her CLL.  She was started on Ibrutinib on 8/2 for her WBC of 84.8 and doubled WBC.  She says she has been tolerating this well.     REVIEW OF SYSTEMS: Ruey is doing moderately well.  She continues to have mild shortness of breath.  She was evaluated by cardiology and will undergo cardiac CT.  She is waiting on this being scheduled. She denies any new issues, and is feeling moderately well.  Rechelle has not had any fever or chills.  She is without cough, chest pain or palpitations.  She has no mucositis, bowel/bladder issues, nausea, or vomiting.  A detailed ROS was otherwise non contributory.     PAST MEDICAL HISTORY: Past Medical History:  Diagnosis Date  . Alopecia   . Anxiety   . Arthritis    "spine" (07/29/2019)  . Breast cancer ([place]) 1997; 2014   right  . CAP (community acquired pneumonia)    admission 06-04-2013, failed outpatient  . Chronic back pain    "lower back and upper neck" (07/29/2019)  . CLL (chronic lymphocytic leukemia) ([place]) 05/2013  . DDD (degenerative disc disease)   . 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 ear   . Depression   . Diverticulosis   . Elevated LFTs   . Fibromyalgia 1978  . GERD (gastroesophageal reflux disease)   . Hematuria   . History of syncope    episode 2008--  no recurrence since  . HLD (hyperlipidemia)   . Hypothyroidism   . Kidney stones 2014  .  Plantar fasciitis   . Ruptured disk    one in neck and two in back  . S/P chemotherapy, time since greater than 12 weeks   . Sinus tachycardia    mild resting  . Skin cancer   . Stool incontinence    "at times recently"     PAST SURGICAL HISTORY: Past Surgical History:  Procedure Laterality Date  . ANTERIOR LATERAL LUMBAR FUSION 4 LEVELS Left 04/25/2016   Procedure: LEFT LUMBAR ONE-TWO, LUMBAR TWO-THREE, LUMBAR THREE-FOUR,  LUMBAR FOUR-FIVE ANTERIOR LATERAL LUMBAR FUSION;  Surgeon: Earnie Larsson, MD;  Location: [place] [facility];  Service: Neurosurgery;  Laterality: Left;  . APPENDECTOMY  1997  . APPLICATION OF ROBOTIC ASSISTANCE FOR SPINAL PROCEDURE N/A 04/06/2017   Procedure: APPLICATION OF ROBOTIC ASSISTANCE FOR SPINAL PROCEDURE;  Surgeon: Earnie Larsson, MD;  Location: [facility];  Service: Neurosurgery;  Laterality: N/A;  . BREAST BIOPSY Right 2014  . BREAST LUMPECTOMY Right 1997  . BREAST LUMPECTOMY WITH AXILLARY LYMPH NODE DISSECTION Right 1997  . CATARACT EXTRACTION, BILATERAL  2019   Dr. Kathlen Mody  . CYSTOSCOPY WITH RETROGRADE PYELOGRAM, URETEROSCOPY AND STENT PLACEMENT Bilateral 06/17/2013   Procedure: CYSTOSCOPY WITH RETROGRADE PYELOGRAM, URETEROSCOPY AND LEFT DOUBLE  J STENT PLACEMENT RIGHT URETERAL HOLMIIUM LASER AND DOUBLE J STENT ;  Surgeon: Hanley Ben, MD;  Location: [facility];  Service: Urology;  Laterality: Bilateral;  . HOLMIUM LASER APPLICATION Bilateral 40/76/8088   Procedure: HOLMIUM LASER APPLICATION;  Surgeon: Hanley Ben, MD;  Location: [facility];  Service: Urology;  Laterality: Bilateral;  . LITHOTRIPSY Left 06/2013  . LUMBAR EPIDURAL INJECTION     has had 7 injections  . PORT-A-CATH REMOVAL Left 10/03/2014   Procedure: REMOVAL PORT-A-CATH;  Surgeon: Donnie Mesa, MD;  Location: [facility];  Service: General;  Laterality: Left;  . PORTACATH PLACEMENT Left 07/28/2013   Procedure: ATTEMPTED INSERTION PORT-A-CATH;  Surgeon: Imogene Burn. Georgette Dover, MD;  Location: [facility];  Service: General;  Laterality: Left;  . POSTERIOR LUMBAR FUSION 4 LEVEL N/A 04/25/2016   Procedure: LUMBAR FIVE-SACRAL ONE POSTERIOR LUMBAR INTERBODY FUSION, THORACIC NINE-SACRAL ONE POSTERIOR LATERAL ARTHRODESIS WITH PEDICLE SCREWS;  Surgeon: Earnie Larsson, MD;  Location: [place];  Service: Neurosurgery;  Laterality: N/A;  . RETINAL DETACHMENT SURGERY Right 2013  . ROBOTIC ASSITED PARTIAL  NEPHRECTOMY Right 02/05/2015   Procedure: ROBOTIC ASSITED PARTIAL NEPHRECTOMY;  Surgeon: Raynelle Bring, MD;  Location: [facility];  Service: Urology;  Laterality: Right;  . SIMPLE MASTECTOMY WITH AXILLARY SENTINEL NODE BIOPSY Right 07/28/2013   Procedure: RIGHT TOTAL  MASTECTOMY;  Surgeon: Imogene Burn. Georgette Dover, MD;  Location: [facility];  Service: General;  Laterality: Right;  . TONSILLECTOMY  AGE 41  . TOTAL ABDOMINAL HYSTERECTOMY W/ BILATERAL SALPINGOOPHORECTOMY  1997    FAMILY HISTORY Family History  Problem Relation Age of Onset  . Heart disease Maternal Grandfather   . Diabetes Maternal Grandfather   . Colon cancer Maternal Aunt 79  . Brain cancer Paternal Grandmother        dx in 75s  . Dementia Mother   . Diabetes Mother   . Osteoporosis Mother   . Diabetes Maternal Aunt   . Prostate cancer Father 75  . Bipolar disorder Maternal Aunt   . Stroke Maternal Aunt   . Stomach cancer Paternal Uncle        dx in late 62s   the patient's mother died at the age of 72. The patient's father is alive at age 54. She had  no brothers or sisters. Her father has a history of prostate cancer. There is no history of breast or ovarian cancer in the family. One maternal first cousin has a history of Hodgkin's lymphoma.  GYNECOLOGIC HISTORY:  Menarche age 78, first live birth age 28. She is GX P1. She underwent total abdominal hysterectomy and bilateral salpingo-oophorectomy in 1997 she did not take hormone replacement.  SOCIAL HISTORY: (Updated  11/15/2013)  She is a homemaker. Her husband Arnette [place] farms approximately 500 acres, including quite a bit of tobacco. Daughter Colletta [place] lives in [place] where she works as a Pension scheme manager for a drug company. The patient has no grandchildren. She has two "grand cats". She attends a local united church of [place]: Not in place   HEALTH MAINTENANCE:  (Updated   11/15/2013 ) Social History   Tobacco Use  . Smoking  status: Never Smoker  . Smokeless tobacco: Never Used  Substance Use Topics  . Alcohol use: No    Comment: 07/28/2013 "no alcohol since 1994; never had problem w/it"  . Drug use: No     Colonoscopy: 2009/ [facility]  PAP: Status post hysterectomy  Bone density: May 10/14/[facility] was normal  Lipid panel:  Not on file   Allergies  Allergen Reactions  . Lasix [Furosemide] Nausea And Vomiting and Other (See Comments)    HEADACHE  . Lithium Other (See Comments)    Dizziness, "cause me to fall"  . Sulfa Antibiotics Hives  . Trazodone And Nefazodone Other (See Comments)    insomnia  . Lyrica [Pregabalin] Diarrhea, Nausea Only and Rash    Current Outpatient Medications  Medication Sig Dispense Refill  . allopurinol (ZYLOPRIM) 300 MG tablet Take 1 tablet (300 mg total) by mouth daily. 90 tablet 4  . ALPRAZolam (XANAX) 0.5 MG tablet Take 0.5 mg by mouth 2 (two) times daily.     Marland Kitchen amitriptyline (ELAVIL) 150 MG tablet Take 150 mg by mouth at bedtime.    . DULoxetine (CYMBALTA) 30 MG capsule Take 90 mg by mouth daily after breakfast.     . gabapentin (NEURONTIN) 300 MG capsule Take 314ms by mouth daily 90 capsule 4  . Ibrutinib 280 MG TABS Take 280 mg by mouth every morning. Take with a glass of water at approximately the same time each day. 90 tablet 4  . levothyroxine (SYNTHROID, LEVOTHROID) 88 MCG tablet Take 88 mcg by mouth daily before breakfast.   4  . metoprolol tartrate (LOPRESSOR) 100 MG tablet Take 1 tablet (100 mg) by mouth once 2 hours prior to procedure 1 tablet 0  . omeprazole (PRILOSEC) 40 MG capsule TAKE 1 CAPSULE(40 MG) BY MOUTH DAILY AFTER BREAKFAST 90 capsule 0  . oxyCODONE-acetaminophen (PERCOCET) 5-325 MG tablet Take 1-2 tablets by mouth every 4 (four) hours as needed for severe pain. 60 tablet 0  . REXULTI 2 MG TABS Take 2 mg by mouth at bedtime.   1   No current facility-administered medications for this visit.     OBJECTIVE:   Vitals:   05/06/19 1309  BP:  106/62  Pulse: 98  Resp: 18  Temp: 97.8 F (36.6 C)  SpO2: 97%    Body mass index is 34.18 kg/m.      Wt Readings from Last 3 Encounters:  05/06/19 199 lb 1.6 oz (90.3 kg)  04/25/19 201 lb (91.2 kg)  03/25/19 203 lb 4.8 oz (92.2 kg)   ECOG FS:2 - Symptomatic, <50% confined to bed GENERAL:  Patient is a well appearing female in no acute distress HEENT:  Sclerae anicteric.  Oropharynx clear and moist. No ulcerations or evidence of oropharyngeal candidiasis. Neck is supple.  NODES:  No cervical, supraclavicular, or axillary lymphadenopathy palpated.  BREAST EXAM:  Deferred. LUNGS:  Clear to auscultation bilaterally.  No wheezes or rhonchi. HEART:  Regular rate and rhythm. No murmur appreciated. ABDOMEN:  Soft, nontender.  Positive, normoactive bowel sounds. No organomegaly palpated. MSK:  No focal spinal tenderness to palpation. Full range of motion bilaterally in the upper extremities. EXTREMITIES:  No peripheral edema.   SKIN:  Clear with no obvious rashes or skin changes. No nail dyscrasia. NEURO:  Nonfocal. Well oriented.  Appropriate affect.    LAB RESULTS:  Lab Results  Component Value Date   WBC 49.9 (H) 05/06/2019   NEUTROABS 4.8 05/06/2019   HGB 11.2 (L) 05/06/2019   HCT 36.1 05/06/2019   MCV 89.8 05/06/2019   PLT 213 05/06/2019      Chemistry      Component Value Date/Time   NA 133 (L) 05/06/2019 1250   NA 140 03/25/2017 1300   K 4.5 05/06/2019 1250   K 4.2 03/25/2017 1300   CL 100 05/06/2019 1250   CO2 26 05/06/2019 1250   CO2 24 03/25/2017 1300   BUN 18 05/06/2019 1250   BUN 17.9 03/25/2017 1300   CREATININE 1.15 (H) 05/06/2019 1250   CREATININE 1.10 (H) 06/29/2018 1258   CREATININE 1.1 03/25/2017 1300      Component Value Date/Time   CALCIUM 9.4 05/06/2019 1250   CALCIUM 9.4 03/25/2017 1300   ALKPHOS 81 05/06/2019 1250   ALKPHOS 102 03/25/2017 1300   AST 21 05/06/2019 1250   AST 21 06/29/2018 1258   AST 41 (H) 03/25/2017 1300   ALT 19  05/06/2019 1250   ALT 20 06/29/2018 1258   ALT 52 03/25/2017 1300   BILITOT 0.3 05/06/2019 1250   BILITOT <0.2 (L) 06/29/2018 1258   BILITOT 0.36 03/25/2017 1300      STUDIES: No results found.    ASSESSMENT: 73 y.o. BRCA negative [place] woman  (1) status post right breast lumpectomy and axillary lymph node dissection in 1997 for a stage I breast cancer, treated with adjuvant radiation and tamoxifen for 5 years  (2) status post right breast lower outer quadrant biopsy 07/13/2013 for a clinical T1c N0, stage IA invasive ductal carcinoma, grade 2, estrogen receptor 100% positive, progesterone receptor 13% positive, with an MIB-1 of 33%, and HER-2 amplification by CISH with a HER2/CEP 17 ratio of 3.19, and an average HER-2 copy number per cell of 4.15  (3) status post right mastectomy 07/28/2013 for a pT1c pN0, stage IA invasive ductal carcinoma, grade 3, with close but negative margins. Prognostic panel was not repeated  (a) the patient met with Dr. Harlow Mares and has decided against reconstruction  (4) completed weekly paclitaxel x12 12/06/2913, with trastuzumab/ pertuzumab every 3 weeks; pertuzumab was held with third dose on 11/01/2013 due to diarrhea, tried a half dose on cycle 4 again with diarrhea developing  (5) trastuzumab (started 09/20/2013) continued for 1 year, last dose 09/21/2014;  (a) final echocardiogram 09/07/2014 showed an ejection fraction of 55-60%  (6) anastrozole started May 2015; completed April 2020  (a) bone density 12/15/2013 normal  (b) bone density 02/01/2016 at [facility] was normal with a T score of -1.0   OTHER PROBLEMS: (a) History of chronic lymphoid leukemia diagnosed by flow cytometry 06/29/2013, the cells being CD5, CD20 and  CD23 positive, CD10 negative.   (1) right renal mass resected on 02/05/15, consisting of atypical lymphoid proliferation composed of monotonous small lymphocytes   (b) Anemia with a normal MCV and normal ferritin-- B-12 and  folate normal; stable  (c) ibrutinib milligrams daily started 08/01/202020    PLAN: Danicka is doing well today.  I reviewed her labs with her today and her WBC are decreasing nicely.  She was recommended to continue the Ibrutinib daily.    I got an EKG on her today which doesn't show A fib, or any other arrhythmia.  She will continue to f/u with cardiology about her heart.  I encouraged her to call their office to f/u on the scheduling of her cardiac CT.    We will see Jennilee back in 4-6 weeks for labs and f/u.  She was recommended to continue with the appropriate pandemic precautions. She knows to call for any other issue that may develop before then.  A total of (20) minutes of face-to-face time was spent with this patient with greater than 50% of that time in counseling and care-coordination.   Wilber Bihari, NP  05/06/19 3:41 PM Medical Oncology and Hematology [facility] [place], [place] [address] Tel. [phone]    Fax. [phone]

## 2019-05-07 LAB — HEPATITIS B CORE ANTIBODY, IGM: Hep B C IgM: NEGATIVE

## 2019-05-07 LAB — HEPATITIS C ANTIBODY: HCV Ab: <0.1 s/co ratio (ref 0.0–0.9)

## 2019-05-07 LAB — HEPATITIS B SURFACE ANTIGEN: Hepatitis B Surface Ag: NEGATIVE

## 2019-05-09 ENCOUNTER — Telehealth: Payer: Self-pay | Admitting: Adult Health

## 2019-05-09 LAB — BETA 2 MICROGLOBULIN, SERUM: Beta-2 Microglobulin: 3.2 mg/L — ABNORMAL HIGH (ref 0.6–2.4)

## 2019-05-09 NOTE — Telephone Encounter (Signed)
I talk with patient regarding schedule  

## 2019-05-11 ENCOUNTER — Other Ambulatory Visit: Payer: Self-pay | Admitting: Oncology

## 2019-05-11 ENCOUNTER — Telehealth: Payer: Self-pay | Admitting: Cardiology

## 2019-05-11 DIAGNOSIS — C9110 Chronic lymphocytic leukemia of B-cell type not having achieved remission: Secondary | ICD-10-CM

## 2019-05-11 DIAGNOSIS — C50011 Malignant neoplasm of nipple and areola, right female breast: Secondary | ICD-10-CM

## 2019-05-11 NOTE — Telephone Encounter (Signed)
New Message:   Pt said she had a normal Echo. She wants to know if she will still need the CT?

## 2019-05-12 NOTE — Telephone Encounter (Signed)
Yes, It would be nice to get the CT for complete evaluation (coronary arteries) Thanks Candee Furbish, MD

## 2019-05-12 NOTE — Telephone Encounter (Signed)
I spoke to the patient and gave her Dr Marlou Porch' recommendation to do the Coronary CT.  She verbalized understanding.

## 2019-05-16 ENCOUNTER — Telehealth (HOSPITAL_COMMUNITY): Payer: Self-pay | Admitting: Emergency Medicine

## 2019-05-16 NOTE — Telephone Encounter (Signed)
attempted both numbers, unable to leave message on either

## 2019-05-17 ENCOUNTER — Telehealth: Payer: Self-pay

## 2019-05-17 ENCOUNTER — Telehealth: Payer: Self-pay | Admitting: Cardiology

## 2019-05-17 DIAGNOSIS — C50511 Malignant neoplasm of lower-outer quadrant of right female breast: Secondary | ICD-10-CM

## 2019-05-17 NOTE — Telephone Encounter (Signed)
RN spoke with patient regarding wrong dosage of Imbruvica.    Pt reports on Saturday she took AM dose, and got confused and took another dose in PM.  Pt reported she experienced shaking, confusion later that evening on Saturday.   Pt reports 2 falls today, states. "I don't think it has anything to do with the medicine, I was trying to clean my floor and squatted down and lost balance."    RN reviewed with MD.  Per MD ok to continue daily dose of Imbruvica however has concerns with confusion.  Recommendations to obtain CBC and CMP and follow up with Wilber Bihari, NP.  Pt aware, voiced understanding and agreement.  Scheduling message sent, lab orders placed.

## 2019-05-17 NOTE — Telephone Encounter (Signed)
New Message:  Pt c/o medication issue:  1. Name of Medication: Ibrutinib 280 MG TABS  2. How are you currently taking this medication (dosage and times per day)? Once daily  3. Are you having a reaction (difficulty breathing--STAT)? no  4. What is your medication issue? Patient accidentally took two doses of medication on the same day instead of just one. She got confused and took a second dose. After taking the second dose, she felt weak, shaky and could not urinate.  She did this on Saturday night. She also reports that she had some shaking before she even started taking the medication  She fell twice this morning, but she is not sure if this has to do with taking the medication on Saturday. She was told that the medication is toxic, but this is the first time she noticed any side effects. She has to have a CT scan at [facility] tomorrow at 2.

## 2019-05-17 NOTE — Telephone Encounter (Signed)
I spoke to the patient and informed her that she needed to contact her Oncologist to review her Ibrutinib medication.  She apparently took the wrong dosage and is having side effects.

## 2019-05-18 ENCOUNTER — Ambulatory Visit (HOSPITAL_COMMUNITY)
Admission: RE | Admit: 2019-05-18 | Discharge: 2019-05-18 | Disposition: A | Discharge status: Home / Self Care | Payer: Medicare Other | Source: Ambulatory Visit | Attending: Cardiology | Admitting: Cardiology

## 2019-05-18 ENCOUNTER — Other Ambulatory Visit: Payer: Self-pay

## 2019-05-18 ENCOUNTER — Telehealth: Payer: Self-pay | Admitting: Adult Health

## 2019-05-18 DIAGNOSIS — E785 Hyperlipidemia, unspecified: Secondary | ICD-10-CM | POA: Diagnosis not present

## 2019-05-18 DIAGNOSIS — Z79899 Other long term (current) drug therapy: Secondary | ICD-10-CM | POA: Insufficient documentation

## 2019-05-18 DIAGNOSIS — R0602 Shortness of breath: Secondary | ICD-10-CM | POA: Diagnosis not present

## 2019-05-18 DIAGNOSIS — I2584 Coronary atherosclerosis due to calcified coronary lesion: Secondary | ICD-10-CM | POA: Insufficient documentation

## 2019-05-18 DIAGNOSIS — R079 Chest pain, unspecified: Secondary | ICD-10-CM | POA: Diagnosis not present

## 2019-05-18 DIAGNOSIS — K449 Diaphragmatic hernia without obstruction or gangrene: Secondary | ICD-10-CM | POA: Diagnosis not present

## 2019-05-18 DIAGNOSIS — Z79890 Hormone replacement therapy (postmenopausal): Secondary | ICD-10-CM | POA: Diagnosis not present

## 2019-05-18 DIAGNOSIS — E039 Hypothyroidism, unspecified: Secondary | ICD-10-CM | POA: Diagnosis not present

## 2019-05-18 MED ORDER — NITROGLYCERIN 0.4 MG SL SUBL
SUBLINGUAL_TABLET | SUBLINGUAL | Status: AC
  Filled 2019-05-18: qty 2

## 2019-05-18 MED ORDER — NITROGLYCERIN 0.4 MG SL SUBL
0.8000 mg | SUBLINGUAL_TABLET | Freq: Once | SUBLINGUAL | Status: AC
  Administered 2019-05-18: 0.8 mg via SUBLINGUAL
  Filled 2019-05-18: qty 25

## 2019-05-18 MED ORDER — IOHEXOL 350 MG/ML SOLN
80.0000 mL | Freq: Once | INTRAVENOUS | Status: AC | PRN
  Administered 2019-05-18: 80 mL via INTRAVENOUS

## 2019-05-18 NOTE — Telephone Encounter (Signed)
Scheduled appt per 9/22 sch message - pt aware of appt date and time   

## 2019-05-19 ENCOUNTER — Telehealth: Payer: Self-pay | Admitting: Cardiology

## 2019-05-19 ENCOUNTER — Telehealth: Payer: Self-pay | Admitting: Adult Health

## 2019-05-19 ENCOUNTER — Inpatient Hospital Stay: Payer: Medicare Other | Admitting: Adult Health

## 2019-05-19 ENCOUNTER — Telehealth: Payer: Self-pay

## 2019-05-19 ENCOUNTER — Inpatient Hospital Stay: Payer: Medicare Other

## 2019-05-19 MED ORDER — ROSUVASTATIN CALCIUM 5 MG PO TABS: 5.0000 mg | ORAL_TABLET | Freq: Every day | ORAL | 3 refills | Status: DC

## 2019-05-19 NOTE — Telephone Encounter (Signed)
Notes recorded by Frederik Schmidt, RN on 05/19/2019 at 4:32 PM EDT  The patient has been notified of the result and verbalized understanding. All questions (if any) were answered.  Frederik Schmidt, RN 05/19/2019 4:32 PM

## 2019-05-19 NOTE — Telephone Encounter (Signed)
° °  Please return call with CT results

## 2019-05-19 NOTE — Telephone Encounter (Signed)
Notes recorded by Frederik Schmidt, RN on 05/19/2019 at 12:46 PM EDT  [id number] 9/25  ------

## 2019-05-19 NOTE — Telephone Encounter (Signed)
-----   Message from Jerline Pain, MD sent at 05/19/2019 10:55 AM EDT ----- Overall reassuring CT scan with no evidence of flow-limiting disease.  There is however coronary calcification noted in the LAD distribution.  Because of this, I would recommend that she start Crestor 5 mg once a day to help stabilize plaque.  LDL goal should be 70.  Dr. Shelia Media can continue to monitor her lipids.  Please let us know if we can be of further assistance. Candee Furbish, MD

## 2019-05-19 NOTE — Progress Notes (Deleted)
ID: [name] [name] OB: [birth year]  MR#: [id number]  CSN#:[id number]  Patient Care Team: Deland Pretty, MD as PCP - General (Internal Medicine) Jerline Pain, MD as PCP - Cardiology (Cardiology) Magrinat, Virgie Dad, MD as Consulting Physician (Oncology) Milus Banister, MD as Attending Physician (Gastroenterology) Megan Salon, MD as Consulting Physician (Gynecology) Donnie Mesa, MD as Consulting Physician (General Surgery) Noemi Chapel, NP as Nurse Practitioner Rod Can, MD as Consulting Physician (Orthopedic Surgery)   CHIEF COMPLAINT: Estrogen receptor positive Right Breast Cancer; chronic lymphoid leukemia  CURRENT TREATMENT: Observation   BREAST CANCER HISTORY: From the prior summary:  Monserrat has a history of breast cancer dating back to 1997, [name] performed a right lumpectomy and axillary lymph node dissection for what according to the patient and her family was a stage I invasive ductal breast cancer. She received radiation adjuvantly and then took tamoxifen for 5 years  In February of 2014 she had a normal screening mammography, but in November 2014 she felt a "hard lump" in her right breast. She brought this to the attention of her urologist, Dr. Izora Gala, and he set her up for bilateral diagnostic mammography with mammography at [facility] 07/13/2013. This showed her breast density to be category B. There was no mammographic abnormality noted but ultrasonography showed a 1.4 cm irregularly shaped solid mass in the right breast at the 8:00 position. This was biopsied the same day, and showed ([id number]) an invasive ductal carcinoma, grade 2, estrogen receptor 100% positive, progesterone receptor 13% positive, with an MIB-1 of 33% and HER-2 amplification with a HER-2: CEP 17 ratio of 3.19, and a HER-2 copy number percent all of 4.15.  The patient's subsequent history is as detailed below   INTERVAL HISTORY: [name] [name] returns today for follow-up and treatment  of her estrogen receptor positive breast cancer.   She continues under observation.  She completed her anastrozole in 11/2018. She tolerated this well.   She is also here for f/u of her CLL.  She was started on Ibrutinib on 8/2 for her WBC of 84.8 and doubled WBC.     REVIEW OF SYSTEMS: Cashe is doing moderately well.  She continues to have mild shortness of breath.  She was evaluated by cardiology and will under  PAST MEDICAL HISTORY: Past Medical History:  Diagnosis Date  . Alopecia   . Anxiety   . Arthritis    "spine" (07/28/2013)  . Breast cancer ([place]) 1997; 2014   right  . CAP (community acquired pneumonia)    admission 06-04-2013, failed outpatient  . Chronic back pain    "lower back and upper neck" (07/28/2013)  . CLL (chronic lymphocytic leukemia) ([name]) 05/2013  . DDD (degenerative disc disease)   . 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 ear   . Depression   . Diverticulosis   . Elevated LFTs   . Fibromyalgia 1978  . GERD (gastroesophageal reflux disease)   . Hematuria   . History of syncope    episode 2008--  no recurrence since  . HLD (hyperlipidemia)   . Hypothyroidism   . Kidney stones 2014  . Plantar fasciitis   . Ruptured disk    one in neck and two in back  . S/P chemotherapy, time since greater than 12 weeks   . Sinus tachycardia    mild resting  . Skin cancer   . Stool incontinence    "at times recently"     PAST SURGICAL HISTORY: Past Surgical History:  Procedure Laterality Date  .  ANTERIOR LATERAL LUMBAR FUSION 4 LEVELS Left 04/25/2016   Procedure: LEFT LUMBAR ONE-TWO, LUMBAR TWO-THREE, LUMBAR THREE-FOUR, LUMBAR FOUR-FIVE ANTERIOR LATERAL LUMBAR FUSION;  Surgeon: Earnie Larsson, MD;  Location: [place] [facility];  Service: Neurosurgery;  Laterality: Left;  . APPENDECTOMY  1997  . APPLICATION OF ROBOTIC ASSISTANCE FOR SPINAL PROCEDURE N/A 04/06/2017   Procedure: APPLICATION OF ROBOTIC ASSISTANCE FOR SPINAL PROCEDURE;  Surgeon: Earnie Larsson, MD;  Location: [place];  Service:  Neurosurgery;  Laterality: N/A;  . BREAST BIOPSY Right 2014  . BREAST LUMPECTOMY Right 1997  . BREAST LUMPECTOMY WITH AXILLARY LYMPH NODE DISSECTION Right 1997  . CATARACT EXTRACTION, BILATERAL  2019   Dr. Kathlen Mody  . CYSTOSCOPY WITH RETROGRADE PYELOGRAM, URETEROSCOPY AND STENT PLACEMENT Bilateral 06/17/2013   Procedure: CYSTOSCOPY WITH RETROGRADE PYELOGRAM, URETEROSCOPY AND LEFT DOUBLE  J STENT PLACEMENT RIGHT URETERAL HOLMIIUM LASER AND DOUBLE J STENT ;  Surgeon: Hanley Ben, MD;  Location: [place];  Service: Urology;  Laterality: Bilateral;  . HOLMIUM LASER APPLICATION Bilateral 74/25/9563   Procedure: HOLMIUM LASER APPLICATION;  Surgeon: Hanley Ben, MD;  Location: [place];  Service: Urology;  Laterality: Bilateral;  . LITHOTRIPSY Left 06/2013  . LUMBAR EPIDURAL INJECTION     has had 7 injections  . PORT-A-CATH REMOVAL Left 10/03/2014   Procedure: REMOVAL PORT-A-CATH;  Surgeon: Donnie Mesa, MD;  Location: [place];  Service: General;  Laterality: Left;  . PORTACATH PLACEMENT Left 07/28/2013   Procedure: ATTEMPTED INSERTION PORT-A-CATH;  Surgeon: Imogene Burn. Georgette Dover, MD;  Location: [place];  Service: General;  Laterality: Left;  . POSTERIOR LUMBAR FUSION 4 LEVEL N/A 04/25/2016   Procedure: LUMBAR FIVE-SACRAL ONE POSTERIOR LUMBAR INTERBODY FUSION, THORACIC NINE-SACRAL ONE POSTERIOR LATERAL ARTHRODESIS WITH PEDICLE SCREWS;  Surgeon: Earnie Larsson, MD;  Location: [place] [facility];  Service: Neurosurgery;  Laterality: N/A;  . RETINAL DETACHMENT SURGERY Right 2013  . ROBOTIC ASSITED PARTIAL NEPHRECTOMY Right 02/05/2015   Procedure: ROBOTIC ASSITED PARTIAL NEPHRECTOMY;  Surgeon: Raynelle Bring, MD;  Location: [facility];  Service: Urology;  Laterality: Right;  . SIMPLE MASTECTOMY WITH AXILLARY SENTINEL NODE BIOPSY Right 07/28/2013   Procedure: RIGHT TOTAL  MASTECTOMY;  Surgeon: Imogene Burn. Georgette Dover, MD;  Location: [place];  Service: General;  Laterality: Right;   . TONSILLECTOMY  AGE 73  . TOTAL ABDOMINAL HYSTERECTOMY W/ BILATERAL SALPINGOOPHORECTOMY  1997    FAMILY HISTORY Family History  Problem Relation Age of Onset  . Heart disease Maternal Grandfather   . Diabetes Maternal Grandfather   . Colon cancer Maternal Aunt 79  . Brain cancer Paternal Grandmother        dx in 92s  . Dementia Mother   . Diabetes Mother   . Osteoporosis Mother   . Diabetes Maternal Aunt   . Prostate cancer Father 76  . Bipolar disorder Maternal Aunt   . Stroke Maternal Aunt   . Stomach cancer Paternal Uncle        dx in late 58s   the patient's mother died at the age of 48. The patient's father is alive at age 70. She had no brothers or sisters. Her father has a history of prostate cancer. There is no history of breast or ovarian cancer in the family. One maternal first cousin has a history of Hodgkin's lymphoma.  GYNECOLOGIC HISTORY:  Menarche age 55, first live birth age 67. She is GX P1. She underwent total abdominal hysterectomy and bilateral salpingo-oophorectomy in 1997 she did not take hormone replacement.  SOCIAL  HISTORY: (Updated  11/15/2013)  She is a homemaker. Her husband Arnette Norris farms approximately 500 acres, including quite a bit of tobacco. Daughter Colletta [place] lives in [place] where she works as a Pension scheme manager for a drug company. The patient has no grandchildren. She has two "grand cats". She attends a local united church of [place]: Not in place   HEALTH MAINTENANCE:  (Updated   11/15/2013 ) Social History   Tobacco Use  . Smoking status: Never Smoker  . Smokeless tobacco: Never Used  Substance Use Topics  . Alcohol use: No    Comment: 07/28/2013 "no alcohol since 1994; never had problem w/it"  . Drug use: No     Colonoscopy: 2009/ [facility]  PAP: Status post hysterectomy  Bone density: May 10/14/2009 at [facility] was normal  Lipid panel:  Not on file   Allergies  Allergen Reactions  .  Lasix [Furosemide] Nausea And Vomiting and Other (See Comments)    HEADACHE  . Lithium Other (See Comments)    Dizziness, "cause me to fall"  . Sulfa Antibiotics Hives  . Trazodone And Nefazodone Other (See Comments)    insomnia  . Lyrica [Pregabalin] Diarrhea, Nausea Only and Rash    Current Outpatient Medications  Medication Sig Dispense Refill  . allopurinol (ZYLOPRIM) 300 MG tablet Take 1 tablet (300 mg total) by mouth daily. 90 tablet 4  . ALPRAZolam (XANAX) 0.5 MG tablet Take 0.5 mg by mouth 2 (two) times daily.     Marland Kitchen amitriptyline (ELAVIL) 150 MG tablet Take 150 mg by mouth at bedtime.    . DULoxetine (CYMBALTA) 30 MG capsule Take 90 mg by mouth daily after breakfast.     . gabapentin (NEURONTIN) 300 MG capsule Take '300mg'$ s by mouth daily 90 capsule 4  . Ibrutinib 280 MG TABS Take 280 mg by mouth every morning. Take with a glass of water at approximately the same time each day. 90 tablet 4  . levothyroxine (SYNTHROID, LEVOTHROID) 88 MCG tablet Take 88 mcg by mouth daily before breakfast.   4  . metoprolol tartrate (LOPRESSOR) 100 MG tablet Take 1 tablet (100 mg) by mouth once 2 hours prior to procedure 1 tablet 0  . omeprazole (PRILOSEC) 40 MG capsule TAKE 1 CAPSULE(40 MG) BY MOUTH DAILY AFTER BREAKFAST 90 capsule 0  . oxyCODONE-acetaminophen (PERCOCET) 5-325 MG tablet Take 1-2 tablets by mouth every 4 (four) hours as needed for severe pain. 60 tablet 0  . REXULTI 2 MG TABS Take 2 mg by mouth at bedtime.   1   No current facility-administered medications for this visit.     OBJECTIVE:   There were no vitals filed for this visit.  There is no height or weight on file to calculate BMI.      Wt Readings from Last 3 Encounters:  05/06/19 199 lb 1.6 oz (90.3 kg)  04/25/19 201 lb (91.2 kg)  03/25/19 203 lb 4.8 oz (92.2 kg)   ECOG FS:2 - Symptomatic, <50% confined to bed GENERAL: Patient is a well appearing female in no acute distress HEENT:  Sclerae anicteric.  Oropharynx clear  and moist. No ulcerations or evidence of oropharyngeal candidiasis. Neck is supple.  NODES:  No cervical, supraclavicular, or axillary lymphadenopathy palpated.  BREAST EXAM:  Deferred. LUNGS:  Clear to auscultation bilaterally.  No wheezes or rhonchi. HEART:  Regular rate and rhythm. No murmur appreciated. ABDOMEN:  Soft, nontender.  Positive, normoactive bowel sounds. No organomegaly palpated. MSK:  No focal  spinal tenderness to palpation. Full range of motion bilaterally in the upper extremities. EXTREMITIES:  No peripheral edema.   SKIN:  Clear with no obvious rashes or skin changes. No nail dyscrasia. NEURO:  Nonfocal. Well oriented.  Appropriate affect.    LAB RESULTS:  Lab Results  Component Value Date   WBC 49.9 (H) 05/06/2019   NEUTROABS 4.8 05/06/2019   HGB 11.2 (L) 05/06/2019   HCT 36.1 05/06/2019   MCV 89.8 05/06/2019   PLT 213 05/06/2019      Chemistry      Component Value Date/Time   NA 133 (L) 05/06/2019 1250   NA 140 03/25/2017 1300   K 4.5 05/06/2019 1250   K 4.2 03/25/2017 1300   CL 100 05/06/2019 1250   CO2 26 05/06/2019 1250   CO2 24 03/25/2017 1300   BUN 18 05/06/2019 1250   BUN 17.9 03/25/2017 1300   CREATININE 1.15 (H) 05/06/2019 1250   CREATININE 1.10 (H) 06/29/2018 1258   CREATININE 1.1 03/25/2017 1300      Component Value Date/Time   CALCIUM 9.4 05/06/2019 1250   CALCIUM 9.4 03/25/2017 1300   ALKPHOS 81 05/06/2019 1250   ALKPHOS 102 03/25/2017 1300   AST 21 05/06/2019 1250   AST 21 06/29/2018 1258   AST 41 (H) 03/25/2017 1300   ALT 19 05/06/2019 1250   ALT 20 06/29/2018 1258   ALT 52 03/25/2017 1300   BILITOT 0.3 05/06/2019 1250   BILITOT <0.2 (L) 06/29/2018 1258   BILITOT 0.36 03/25/2017 1300      STUDIES: Ct Coronary Morph W/cta Cor W/score W/ca W/cm &/or Wo/cm  Addendum Date: 05/18/2019   ADDENDUM REPORT: 05/18/2019 17:12 CLINICAL DATA:  73 year old with chest pain. PAC's noted at baseline. EXAM: Cardiac/Coronary  CTA  TECHNIQUE: The patient was scanned on a [employer]. FINDINGS: A 100 kV prospective scan was triggered in the descending thoracic aorta at 111 HU's. Axial non-contrast 3 mm slices were carried out through the heart. The data set was analyzed on a dedicated work station and scored using the [place]. Gantry rotation speed was 250 msecs and collimation was .6 mm. 100 mg of metoprolol beta blockade and 0.8 mg of sl NTG was given. The 3D data set was reconstructed in 5% intervals of the 67-82 % of the R-R cycle. Diastolic phases were analyzed on a dedicated work station using MPR, MIP and VRT modes. The patient received 80 cc of contrast. Aorta: Normal size. Mild grade 1 atherosclerosis distal to RCA ostium. No dissection. Aortic Valve:  Trileaflet.  No calcifications. Coronary Arteries:  Normal coronary origin.  Right dominance. RCA is a large dominant artery that gives rise to PDA and PLA. There is no plaque or flow limiting disease. RCA was best seen in 35% R-R interval. Left main is a large artery that gives rise to LAD and LCX arteries. There is no flow limiting disease. LAD is a large vessel that gives rise to 3 diagonal branches which are small in caliber. The ostial and proximal LAD demonstrates small areas of calcified plaque with no evidence of flow limitation. LCX is a non-dominant artery that gives rise to one large OM1 branch. There are OM2 and OM3 small caliber branches. There is no flow limiting disease present. Other findings: Normal pulmonary vein drainage into the left atrium. Normal left atrial appendage without a thrombus. Normal size of the pulmonary artery. IMPRESSION: 1. Coronary calcium score of 12. This was 33 percentile for age and  sex matched control. 2. Normal coronary origin with right dominance. 3. No evidence of flow limiting CAD. Small areas of proximal LAD calcification with no surrounding stenosis. Continue with risk factor modification. Candee Furbish, MD Electronically  Signed   By: Candee Furbish MD   On: 05/18/2019 17:12   Result Date: 05/18/2019 EXAM: OVER-READ INTERPRETATION  CT CHEST The following report is an over-read performed by radiologist Dr. Rolm Baptise of [facility], [place] on 05/18/2019. This over-read does not include interpretation of cardiac or coronary anatomy or pathology. The coronary CTA interpretation by the cardiologist is attached. COMPARISON:  None. FINDINGS: Vascular: Heart is normal size.  Visualized aorta normal caliber. Mediastinum/Nodes: No adenopathy in the lower mediastinum and hilum. Small hiatal hernia. Lungs/Pleura: Visualized lungs clear.  No effusions. Upper Abdomen: Imaging into the upper abdomen shows no acute findings. Musculoskeletal: Prior right mastectomy.  No acute bony abnormality. IMPRESSION: No acute extra cardiac abnormality. Small hiatal hernia. Electronically Signed: By: Rolm Baptise M.D. On: 05/18/2019 16:22      ASSESSMENT: 73 y.o. BRCA negative [place] woman  (1) status post right breast lumpectomy and axillary lymph node dissection in 1997 for a stage I breast cancer, treated with adjuvant radiation and tamoxifen for 5 years  (2) status post right breast lower outer quadrant biopsy 07/13/2013 for a clinical T1c N0, stage IA invasive ductal carcinoma, grade 2, estrogen receptor 100% positive, progesterone receptor 13% positive, with an MIB-1 of 33%, and HER-2 amplification by CISH with a HER2/CEP 17 ratio of 3.19, and an average HER-2 copy number per cell of 4.15  (3) status post right mastectomy 07/28/2013 for a pT1c pN0, stage IA invasive ductal carcinoma, grade 3, with close but negative margins. Prognostic panel was not repeated  (a) the patient met with Dr. Harlow Mares and has decided against reconstruction  (4) completed weekly paclitaxel x12 12/06/2913, with trastuzumab/ pertuzumab every 3 weeks; pertuzumab was held with third dose on 11/01/2013 due to diarrhea, tried a half dose on cycle 4 again with  diarrhea developing  (5) trastuzumab (started 09/20/2013) continued for 1 year, last dose 09/21/2014;  (a) final echocardiogram 09/07/2014 showed an ejection fraction of 55-60%  (6) anastrozole started May 2015; completed April 2020  (a) bone density 12/15/2013 normal  (b) bone density 02/01/2016 at [facility] was normal with a T score of -1.0   OTHER PROBLEMS: (a) History of chronic lymphoid leukemia diagnosed by flow cytometry 06/29/2013, the cells being CD5, CD20 and CD23 positive, CD10 negative.   (1) right renal mass resected on 02/05/15, consisting of atypical lymphoid proliferation composed of monotonous small lymphocytes   (b) Anemia with a normal MCV and normal ferritin-- B-12 and folate normal; stable  (c) ibrutinib milligrams daily started 08/01/202020    PLAN: [place] is   We will see Helen back in 4-6 weeks for labs and f/u.  She was recommended to continue with the appropriate pandemic precautions. She knows to call for any other issue that may develop before then.  A total of (20) minutes of face-to-face time was spent with this patient with greater than 50% of that time in counseling and care-coordination.   Wilber Bihari, NP  05/19/19 9:43 AM Medical Oncology and Hematology [facility] [place], [place] [address] Tel. [phone]    Fax. [phone]

## 2019-05-19 NOTE — Telephone Encounter (Signed)
Follow up ° ° °Patient is returning your call. Please call. ° ° ° °

## 2019-05-19 NOTE — Telephone Encounter (Signed)
Returned patient's phone call regarding rescheduling 09/24 appointment, per patient's request appointment has been moved to 10/02.  Message to provider.

## 2019-05-20 ENCOUNTER — Telehealth: Payer: Self-pay | Admitting: *Deleted

## 2019-05-20 MED FILL — IMBRUVICA 280 MG TAB: 280 | 28 days supply | Qty: 28 | Fill #2

## 2019-05-20 NOTE — Telephone Encounter (Signed)
Sounds good ma'am.  Thank you :)

## 2019-05-20 NOTE — Telephone Encounter (Signed)
Per call 9/22:        Telephone Encounter  Signed  Encounter Date:  05/17/2019          Signed           Added by: [x] Rennis Harding, RN   [] Hover for details RN spoke with patient regarding wrong dosage of Imbruvica.    Pt reports on Saturday she took AM dose, and got confused and took another dose in PM.  Pt reported she experienced shaking, confusion later that evening on Saturday.   Pt reports 2 falls today, states. "I don't think it has anything to do with the medicine, I was trying to clean my floor and squatted down and lost balance."    RN reviewed with MD.  Per MD ok to continue daily dose of Imbruvica however has concerns with confusion.  Recommendations to obtain CBC and CMP and follow up with Wilber Bihari, NP.  Pt aware, voiced understanding and agreement.  Scheduling message sent, lab orders placed.     w:Clear all [x] Manual[] Template[] Copied

## 2019-05-20 NOTE — Telephone Encounter (Signed)
Post discussion with MD and further inquiry with pt - who states issues of balance has been on going prior to start of Imbruvica as well as memory issues - plan is for Dr Jannifer Rodney to see patient as scheduled on 10/8.   Appointment for 05/27/2019 was canceled by this RN and per call to pt while on the phone she marked it off her calender.  Pt understands to call if needed- no further needs at this time.

## 2019-05-20 NOTE — Telephone Encounter (Signed)
[name] [name], will you call patient and see if she is ok waiting until next week to be seen, and how the confusion and falls are doing?

## 2019-05-20 NOTE — Telephone Encounter (Addendum)
To follow up per inquiry for appointment- patient cancelled follow up appointment made per call on 9/22 " just had a busy week with a CT scan and all - I just can't do too many things in one week "  Dr Jannifer Rodney had requested pt to be seen this week this week due to called concerns.  She is still having mild confusion not with person or environment but more with history and dates-   She states she has A Fib and also had a cardiac MRI this week and is being put on Crestor per results.  She states she has episodes of " feeling un balanced and could fall " but denies any further falls since call on 9/22.  " so I don't know if the way I am feeling is the Afib and heart issue or the Imbruvica ?"  Of note pt states she needs refill of Imbruvica  ( I have 4 pills left ) and isn't sure if it has been mailed. This RN will follow up with [facility] to refill.  This RN informed her above would be reviewed with MD and call returned to when she should come in.

## 2019-05-20 NOTE — Telephone Encounter (Signed)
I was under the impression she was going to see GM on 10/8 and that was it.  Is there a reason we need to see her sooner?

## 2019-05-25 NOTE — Telephone Encounter (Signed)
I spoke with pt and reviewed echo and CT results with her.  She is asking if she had atrial fibrillation at office visit.  I told her she did not have atrial fibrillation. I told her sinus arrhythmia was noted and I explained what this was to her.

## 2019-05-25 NOTE — Telephone Encounter (Signed)
Follow Up:   Pt would  like her Echo results from 05-03-19 please.

## 2019-05-27 ENCOUNTER — Ambulatory Visit: Payer: Medicare Other | Admitting: Adult Health

## 2019-05-27 ENCOUNTER — Other Ambulatory Visit: Payer: Medicare Other

## 2019-06-01 NOTE — Progress Notes (Signed)
x

## 2019-06-02 ENCOUNTER — Inpatient Hospital Stay: Payer: Medicare Other

## 2019-06-02 ENCOUNTER — Telehealth: Payer: Self-pay | Admitting: *Deleted

## 2019-06-02 ENCOUNTER — Telehealth: Payer: Self-pay | Admitting: Oncology

## 2019-06-02 ENCOUNTER — Inpatient Hospital Stay (HOSPITAL_BASED_OUTPATIENT_CLINIC_OR_DEPARTMENT_OTHER): Payer: Medicare Other | Admitting: Oncology

## 2019-06-02 DIAGNOSIS — Z170 Estrogen receptor positive status [ER+]: Secondary | ICD-10-CM

## 2019-06-02 DIAGNOSIS — C50511 Malignant neoplasm of lower-outer quadrant of right female breast: Secondary | ICD-10-CM

## 2019-06-02 NOTE — Telephone Encounter (Signed)
This RN received VM left by the patient- stating " I have an appointment - umm- let me spell my name-um- but I took too much of the medicine - overdosed - and now am having tremors - I won't be able to come today - do you have an opening tomorrow."  Note pt's conversation was tangent - but coherent.  Pt left return call number as her home number of [phone].  This RN attempted to call x 3 with number busy each time.  Per call to VM no answer post multiple rings with automated message stating pt does not have VM set up at this time. This RN then called her emergency contact ( husband ) and obtained a verified VM- this RN left for return call to this RN(direct number given for communication) stating concern for pt and urgency that pt keep appointment today.

## 2019-06-02 NOTE — Telephone Encounter (Signed)
Returned patient's phone call regarding an appointment, patient confirmed date and time.

## 2019-06-02 NOTE — Telephone Encounter (Signed)
This RN called the patient this afternoon at 315 pm- and was able to speak to her.  She states she has pill packs " and I think I took 2 of them yesterday and then was awake all night wondering what I did , then this morning I made my hot chocolate and then I started having tremors and - oh- then I got hot chocolate on everything "  " I am going to use a bigger cup next time "  [name] [name] denies any weakness, numbness on side of her body. 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  Note pt was more coherent in her sentences this afternoon though she would get confused over which pills she thinks she overdosed on and then 2 times had to stop her sentence because " I don't remember what I was trying to say".  She states she also just spoke with her primary MD who suggested she go to the Urgent Care due to the lateness of the day. She is reluctant to go to a place that does not know her as well.  This RN stated concerns primary MD may have that could worsen if not assessed - this RN reiterated concern is more with overtaking of other medications that are not prescribed by this office.  [name] [name] will talk with her husband when he comes back home per above.  This RN rescheduled appointments for this office to tomorrow.  This note will be shared with MD for review of communication.

## 2019-06-02 NOTE — Telephone Encounter (Signed)
See other note

## 2019-06-03 ENCOUNTER — Other Ambulatory Visit: Payer: Self-pay

## 2019-06-03 ENCOUNTER — Inpatient Hospital Stay: Payer: Medicare Other | Attending: Oncology | Admitting: Oncology

## 2019-06-03 ENCOUNTER — Inpatient Hospital Stay: Payer: Medicare Other

## 2019-06-03 ENCOUNTER — Ambulatory Visit (HOSPITAL_COMMUNITY)
Admission: RE | Admit: 2019-06-03 | Discharge: 2019-06-03 | Disposition: A | Discharge status: Home / Self Care | Payer: Medicare Other | Source: Ambulatory Visit | Attending: Oncology | Admitting: Oncology

## 2019-06-03 VITALS — BP 110/76 | HR 79 | Temp 98.2°F | Resp 18 | Ht 64.0 in | Wt 195.1 lb

## 2019-06-03 DIAGNOSIS — Z79899 Other long term (current) drug therapy: Secondary | ICD-10-CM | POA: Diagnosis not present

## 2019-06-03 DIAGNOSIS — F419 Anxiety disorder, unspecified: Secondary | ICD-10-CM | POA: Diagnosis not present

## 2019-06-03 DIAGNOSIS — Z888 Allergy status to other drugs, medicaments and biological substances status: Secondary | ICD-10-CM | POA: Diagnosis not present

## 2019-06-03 DIAGNOSIS — R5383 Other fatigue: Secondary | ICD-10-CM | POA: Diagnosis not present

## 2019-06-03 DIAGNOSIS — C9110 Chronic lymphocytic leukemia of B-cell type not having achieved remission: Secondary | ICD-10-CM | POA: Insufficient documentation

## 2019-06-03 DIAGNOSIS — Z5112 Encounter for antineoplastic immunotherapy: Secondary | ICD-10-CM | POA: Diagnosis not present

## 2019-06-03 DIAGNOSIS — Z923 Personal history of irradiation: Secondary | ICD-10-CM | POA: Diagnosis not present

## 2019-06-03 DIAGNOSIS — C50511 Malignant neoplasm of lower-outer quadrant of right female breast: Secondary | ICD-10-CM | POA: Diagnosis not present

## 2019-06-03 DIAGNOSIS — Z170 Estrogen receptor positive status [ER+]: Secondary | ICD-10-CM | POA: Insufficient documentation

## 2019-06-03 DIAGNOSIS — R232 Flushing: Secondary | ICD-10-CM | POA: Insufficient documentation

## 2019-06-03 DIAGNOSIS — K219 Gastro-esophageal reflux disease without esophagitis: Secondary | ICD-10-CM | POA: Insufficient documentation

## 2019-06-03 DIAGNOSIS — Z9221 Personal history of antineoplastic chemotherapy: Secondary | ICD-10-CM | POA: Diagnosis not present

## 2019-06-03 DIAGNOSIS — E039 Hypothyroidism, unspecified: Secondary | ICD-10-CM | POA: Insufficient documentation

## 2019-06-03 DIAGNOSIS — E785 Hyperlipidemia, unspecified: Secondary | ICD-10-CM | POA: Diagnosis not present

## 2019-06-03 DIAGNOSIS — R410 Disorientation, unspecified: Secondary | ICD-10-CM | POA: Diagnosis not present

## 2019-06-03 DIAGNOSIS — R0602 Shortness of breath: Secondary | ICD-10-CM | POA: Diagnosis not present

## 2019-06-03 DIAGNOSIS — F329 Major depressive disorder, single episode, unspecified: Secondary | ICD-10-CM | POA: Diagnosis not present

## 2019-06-03 DIAGNOSIS — Z85828 Personal history of other malignant neoplasm of skin: Secondary | ICD-10-CM | POA: Diagnosis not present

## 2019-06-03 DIAGNOSIS — Z8249 Family history of ischemic heart disease and other diseases of the circulatory system: Secondary | ICD-10-CM | POA: Diagnosis not present

## 2019-06-03 LAB — CBC WITH DIFFERENTIAL (CANCER CENTER ONLY)
Abs Immature Granulocytes: 0.1 10*3/uL — ABNORMAL HIGH (ref 0.00–0.07)
Basophils Absolute: 0.2 10*3/uL — ABNORMAL HIGH (ref 0.0–0.1)
Basophils Relative: 0 %
Eosinophils Absolute: 0.3 10*3/uL (ref 0.0–0.5)
Eosinophils Relative: 1 %
HCT: 35.5 % — ABNORMAL LOW (ref 36.0–46.0)
Hemoglobin: 10.9 g/dL — ABNORMAL LOW (ref 12.0–15.0)
Immature Granulocytes: 0 %
Lymphocytes Relative: 83 %
Lymphs Abs: 37.4 10*3/uL — ABNORMAL HIGH (ref 0.7–4.0)
MCH: 27.4 pg (ref 26.0–34.0)
MCHC: 30.7 g/dL (ref 30.0–36.0)
MCV: 89.2 fL (ref 80.0–100.0)
Monocytes Absolute: 0.8 10*3/uL (ref 0.1–1.0)
Monocytes Relative: 2 %
Neutro Abs: 6.2 10*3/uL (ref 1.7–7.7)
Neutrophils Relative %: 14 %
Platelet Count: 220 10*3/uL (ref 150–400)
RBC: 3.98 MIL/uL (ref 3.87–5.11)
RDW: 14.6 % (ref 11.5–15.5)
WBC Count: 45 10*3/uL — ABNORMAL HIGH (ref 4.0–10.5)
nRBC: 0 % (ref 0.0–0.2)

## 2019-06-03 LAB — RETICULOCYTES
Immature Retic Fract: 11 % (ref 2.3–15.9)
RBC.: 3.98 MIL/uL (ref 3.87–5.11)
Retic Count, Absolute: 61.3 10*3/uL (ref 19.0–186.0)
Retic Ct Pct: 1.5 % (ref 0.4–3.1)

## 2019-06-03 LAB — CMP (CANCER CENTER ONLY)
ALT: 13 U/L (ref 0–44)
AST: 17 U/L (ref 15–41)
Albumin: 4.4 g/dL (ref 3.5–5.0)
Alkaline Phosphatase: 79 U/L (ref 38–126)
Anion gap: 11 (ref 5–15)
BUN: 17 mg/dL (ref 8–23)
CO2: 24 mmol/L (ref 22–32)
Calcium: 9.5 mg/dL (ref 8.9–10.3)
Chloride: 103 mmol/L (ref 98–111)
Creatinine: 1.12 mg/dL — ABNORMAL HIGH (ref 0.44–1.00)
GFR, Est AFR Am: 56 mL/min — ABNORMAL LOW (ref >60)
GFR, Estimated: 49 mL/min — ABNORMAL LOW (ref >60)
Glucose, Bld: 114 mg/dL — ABNORMAL HIGH (ref 70–99)
Potassium: 4.7 mmol/L (ref 3.5–5.1)
Sodium: 138 mmol/L (ref 135–145)
Total Bilirubin: 0.3 mg/dL (ref 0.3–1.2)
Total Protein: 6.8 g/dL (ref 6.5–8.1)

## 2019-06-03 LAB — LACTATE DEHYDROGENASE: LDH: 161 U/L (ref 98–192)

## 2019-06-03 NOTE — Progress Notes (Signed)
ID: [name] [name] OB: Aug 22, 1946  MR#: [id number]  CSN#:[id number]  Patient Care Team: Deland Pretty, MD as PCP - General (Internal Medicine) Jerline Pain, MD as PCP - Cardiology (Cardiology) Magrinat, Virgie Dad, MD as Consulting Physician (Oncology) Milus Banister, MD as Attending Physician (Gastroenterology) Megan Salon, MD as Consulting Physician (Gynecology) Donnie Mesa, MD as Consulting Physician (General Surgery) Noemi Chapel, NP as Nurse Practitioner Rod Can, MD as Consulting Physician (Orthopedic Surgery)   CHIEF COMPLAINT: Estrogen receptor positive Right Breast Cancer; chronic lymphoid leukemia  CURRENT TREATMENT: to start rituximab   BREAST CANCER HISTORY: From the prior summary:  [name] [name] has a history of breast cancer dating back to 1997, [name] [name] Dr. Annabell Sabal performed a right lumpectomy and axillary lymph node dissection for what according to the patient and her family was a stage I invasive ductal breast cancer. She received radiation adjuvantly and then took tamoxifen for 5 years  In February of 2014 she had a normal screening mammography, but in November 2014 she felt a "hard lump" in her right breast. She brought this to the attention of her urologist, Dr. Izora Gala, and he set her up for bilateral diagnostic mammography with mammography at [facility] 07/13/2013. This showed her breast density to be category B. There was no mammographic abnormality noted but ultrasonography showed a 1.4 cm irregularly shaped solid mass in the right breast at the 8:00 position. This was biopsied the same day, and showed ([id number]) an invasive ductal carcinoma, grade 2, estrogen receptor 100% positive, progesterone receptor 13% positive, with an MIB-1 of 33% and HER-2 amplification with a HER-2: CEP 17 ratio of 3.19, and a HER-2 copy number percent all of 4.15.  The patient's subsequent history is as detailed below   INTERVAL HISTORY: [name] [name] returns today for follow-up and  treatment of her chronic lymphoid leukemia and estrogen receptor positive breast cancer. She was last seen here on 05/06/2019.   She continues under observation as far as her breast cancer is concerned.  Her most recent mammogram was 04/04/2019.  It showed the breast density category B.  There was no evidence of malignancy.  Since her last visit here, she underwent a Cardiac CTA on 05/18/2019 showing: Coronary calcium score of 12. This was 88 percentile for age and sex matched control. Normal coronary origin with right dominance.No evidence of flow limiting CAD. Small areas of proximal LAD calcification with no surrounding stenosis. Continue with risk factor modification.  REVIEW OF SYSTEMS: We asked [name] [name] to come in today because she seems very confused about her medications and confused in general.  He is aware of the fact that sometimes she will start a sentence and not be able to complete it.  She easily forgets what she meant to say and cannot find words.  It is not clear that she has been taking the ibrutinib correctly.  Aside from these issues, she tells me she fell at home, hit her back against the bathtub, and has a bruise she wanted me to look at.  She denies unusual headaches, visual changes, nausea, or vomiting.  A detailed review of systems was otherwise stable   PAST MEDICAL HISTORY: Past Medical History:  Diagnosis Date  . Alopecia   . Anxiety   . Arthritis    "spine" (07/28/2013)  . Breast cancer ([place]) 1997; 2014   right  . CAP (community acquired pneumonia)    admission 06-04-2013, failed outpatient  . Chronic back pain    "lower back and upper  neck" (07/28/2013)  . CLL (chronic lymphocytic leukemia) ([place]) 05/2013  . DDD (degenerative disc disease)   . 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 ear   . Depression   . Diverticulosis   . Elevated LFTs   . Fibromyalgia 1978  . GERD (gastroesophageal reflux disease)   . Hematuria   . History of syncope    episode 2008--  no recurrence since  .  HLD (hyperlipidemia)   . Hypothyroidism   . Kidney stones 2014  . Plantar fasciitis   . Ruptured disk    one in neck and two in back  . S/P chemotherapy, time since greater than 12 weeks   . Sinus tachycardia    mild resting  . Skin cancer   . Stool incontinence    "at times recently"     PAST SURGICAL HISTORY: Past Surgical History:  Procedure Laterality Date  . ANTERIOR LATERAL LUMBAR FUSION 4 LEVELS Left 04/25/2016   Procedure: LEFT LUMBAR ONE-TWO, LUMBAR TWO-THREE, LUMBAR THREE-FOUR, LUMBAR FOUR-FIVE ANTERIOR LATERAL LUMBAR FUSION;  Surgeon: Earnie Larsson, MD;  Location: [facility];  Service: Neurosurgery;  Laterality: Left;  . APPENDECTOMY  1997  . APPLICATION OF ROBOTIC ASSISTANCE FOR SPINAL PROCEDURE N/A 04/06/2017   Procedure: APPLICATION OF ROBOTIC ASSISTANCE FOR SPINAL PROCEDURE;  Surgeon: Earnie Larsson, MD;  Location: [facility];  Service: Neurosurgery;  Laterality: N/A;  . BREAST BIOPSY Right 2014  . BREAST LUMPECTOMY Right 1997  . BREAST LUMPECTOMY WITH AXILLARY LYMPH NODE DISSECTION Right 1997  . CATARACT EXTRACTION, BILATERAL  2019   Dr. Kathlen Mody  . CYSTOSCOPY WITH RETROGRADE PYELOGRAM, URETEROSCOPY AND STENT PLACEMENT Bilateral 06/17/2013   Procedure: CYSTOSCOPY WITH RETROGRADE PYELOGRAM, URETEROSCOPY AND LEFT DOUBLE  J STENT PLACEMENT RIGHT URETERAL HOLMIIUM LASER AND DOUBLE J STENT ;  Surgeon: Hanley Ben, MD;  Location: [facility] [place];  Service: Urology;  Laterality: Bilateral;  . HOLMIUM LASER APPLICATION Bilateral 80/99/8338   Procedure: HOLMIUM LASER APPLICATION;  Surgeon: Hanley Ben, MD;  Location: [facility];  Service: Urology;  Laterality: Bilateral;  . LITHOTRIPSY Left 06/2013  . LUMBAR EPIDURAL INJECTION     has had 7 injections  . PORT-A-CATH REMOVAL Left 10/03/2014   Procedure: REMOVAL PORT-A-CATH;  Surgeon: Donnie Mesa, MD;  Location: [facility];  Service: General;  Laterality: Left;  . PORTACATH PLACEMENT Left  07/28/2013   Procedure: ATTEMPTED INSERTION PORT-A-CATH;  Surgeon: Imogene Burn. Georgette Dover, MD;  Location: [facility];  Service: General;  Laterality: Left;  . POSTERIOR LUMBAR FUSION 4 LEVEL N/A 04/25/2016   Procedure: LUMBAR FIVE-SACRAL ONE POSTERIOR LUMBAR INTERBODY FUSION, THORACIC NINE-SACRAL ONE POSTERIOR LATERAL ARTHRODESIS WITH PEDICLE SCREWS;  Surgeon: Earnie Larsson, MD;  Location: [facility];  Service: Neurosurgery;  Laterality: N/A;  . RETINAL DETACHMENT SURGERY Right 2013  . ROBOTIC ASSITED PARTIAL NEPHRECTOMY Right 02/05/2015   Procedure: ROBOTIC ASSITED PARTIAL NEPHRECTOMY;  Surgeon: Raynelle Bring, MD;  Location: [facility];  Service: Urology;  Laterality: Right;  . SIMPLE MASTECTOMY WITH AXILLARY SENTINEL NODE BIOPSY Right 07/28/2013   Procedure: RIGHT TOTAL  MASTECTOMY;  Surgeon: Imogene Burn. Georgette Dover, MD;  Location: [facility];  Service: General;  Laterality: Right;  . TONSILLECTOMY  AGE 20  . TOTAL ABDOMINAL HYSTERECTOMY W/ BILATERAL SALPINGOOPHORECTOMY  1997    FAMILY HISTORY Family History  Problem Relation Age of Onset  . Heart disease Maternal Grandfather   . Diabetes Maternal Grandfather   . Colon cancer Maternal Aunt 79  . Brain cancer Paternal Grandmother  dx in 70s  . Dementia Mother   . Diabetes Mother   . Osteoporosis Mother   . Diabetes Maternal Aunt   . Prostate cancer Father 28  . Bipolar disorder Maternal Aunt   . Stroke Maternal Aunt   . Stomach cancer Paternal Uncle        dx in late 59s   the patient's mother died at the age of 29. The patient's father is alive at age 24. She had no brothers or sisters. Her father has a history of prostate cancer. There is no history of breast or ovarian cancer in the family. One maternal first cousin has a history of Hodgkin's lymphoma.  GYNECOLOGIC HISTORY:  Menarche age 17, first live birth age 73. She is GX P1. She underwent total abdominal hysterectomy and bilateral salpingo-oophorectomy in 1997 she did not take hormone  replacement.  SOCIAL HISTORY: (Updated  11/15/2013)  She is a homemaker. Her husband Arnette Norris farms approximately 500 acres, including quite a bit of tobacco. Daughter Colletta [place] lives in [place] where she works as a Pension scheme manager for a drug company. The patient has no grandchildren. She has two "grand cats". She attends a local united church of [place]: Not in place   HEALTH MAINTENANCE:  (Updated   11/15/2013 ) Social History   Tobacco Use  . Smoking status: Never Smoker  . Smokeless tobacco: Never Used  Substance Use Topics  . Alcohol use: No    Comment: 07/28/2013 "no alcohol since 1994; never had problem w/it"  . Drug use: No     Colonoscopy: 2009/ [facility]  PAP: Status post hysterectomy  Bone density: May 10/14/2009 at [facility] was normal  Lipid panel:  Not on file   Allergies  Allergen Reactions  . Lasix [Furosemide] Nausea And Vomiting and Other (See Comments)    HEADACHE  . Lithium Other (See Comments)    Dizziness, "cause me to fall"  . Sulfa Antibiotics Hives  . Trazodone And Nefazodone Other (See Comments)    insomnia  . Lyrica [Pregabalin] Diarrhea, Nausea Only and Rash    Current Outpatient Medications  Medication Sig Dispense Refill  . allopurinol (ZYLOPRIM) 300 MG tablet Take 1 tablet (300 mg total) by mouth daily. 90 tablet 4  . amitriptyline (ELAVIL) 150 MG tablet Take 150 mg by mouth at bedtime.    . DULoxetine (CYMBALTA) 30 MG capsule Take 90 mg by mouth daily after breakfast.     . levothyroxine (SYNTHROID, LEVOTHROID) 88 MCG tablet Take 88 mcg by mouth daily before breakfast.   4  . metoprolol tartrate (LOPRESSOR) 100 MG tablet Take 1 tablet (100 mg) by mouth once 2 hours prior to procedure 1 tablet 0  . omeprazole (PRILOSEC) 40 MG capsule TAKE 1 CAPSULE(40 MG) BY MOUTH DAILY AFTER BREAKFAST 90 capsule 0  . REXULTI 2 MG TABS Take 2 mg by mouth at bedtime.   1  . rosuvastatin (CRESTOR) 5 MG tablet Take 1  tablet (5 mg total) by mouth daily. 90 tablet 3   No current facility-administered medications for this visit.     OBJECTIVE: Middle-aged white woman in no acute distress  Vitals:   06/03/19 1317  BP: 110/76  Pulse: 79  Resp: 18  Temp: 98.2 F (36.8 C)  SpO2: 97%   Wt Readings from Last 3 Encounters:  06/03/19 195 lb 1.6 oz (88.5 kg)  05/06/19 199 lb 1.6 oz (90.3 kg)  04/25/19 201 lb (91.2 kg)   Body mass  index is 33.49 kg/m.    ECOG FS:1 - Symptomatic but completely ambulatory  Ocular: Sclerae unicteric, pupils round and equal Ear-nose-throat: Wearing a mask Lymphatic: No cervical or supraclavicular adenopathy Lungs no rales or rhonchi Heart regular rate and rhythm Abd soft, nontender, positive bowel sounds MSK no focal spinal tenderness, no joint edema Neuro: Tired non-focal, oriented x3, appropriate affect, anxious because she is aware that her brain is "not functioning right" but without any focal findings of concern Breasts: Deferred    LAB RESULTS:  Lab Results  Component Value Date   WBC 45.0 (H) 06/03/2019   NEUTROABS 6.2 06/03/2019   HGB 10.9 (L) 06/03/2019   HCT 35.5 (L) 06/03/2019   MCV 89.2 06/03/2019   PLT 220 06/03/2019      Chemistry      Component Value Date/Time   NA 138 06/03/2019 1150   NA 140 03/25/2017 1300   K 4.7 06/03/2019 1150   K 4.2 03/25/2017 1300   CL 103 06/03/2019 1150   CO2 24 06/03/2019 1150   CO2 24 03/25/2017 1300   BUN 17 06/03/2019 1150   BUN 17.9 03/25/2017 1300   CREATININE 1.12 (H) 06/03/2019 1150   CREATININE 1.1 03/25/2017 1300      Component Value Date/Time   CALCIUM 9.5 06/03/2019 1150   CALCIUM 9.4 03/25/2017 1300   ALKPHOS 79 06/03/2019 1150   ALKPHOS 102 03/25/2017 1300   AST 17 06/03/2019 1150   AST 41 (H) 03/25/2017 1300   ALT 13 06/03/2019 1150   ALT 52 03/25/2017 1300   BILITOT 0.3 06/03/2019 1150   BILITOT 0.36 03/25/2017 1300      STUDIES: Ct Coronary Morph W/cta Cor W/score W/ca W/cm  &/or Wo/cm  Addendum Date: 05/18/2019   ADDENDUM REPORT: 05/18/2019 17:12 CLINICAL DATA:  73 year old with chest pain. PAC's noted at baseline. EXAM: Cardiac/Coronary  CTA TECHNIQUE: The patient was scanned on a [employer]. FINDINGS: A 100 kV prospective scan was triggered in the descending thoracic aorta at 111 HU's. Axial non-contrast 3 mm slices were carried out through the heart. The data set was analyzed on a dedicated work station and scored using the [place]. Gantry rotation speed was 250 msecs and collimation was .6 mm. 100 mg of metoprolol beta blockade and 0.8 mg of sl NTG was given. The 3D data set was reconstructed in 5% intervals of the 67-82 % of the R-R cycle. Diastolic phases were analyzed on a dedicated work station using MPR, MIP and VRT modes. The patient received 80 cc of contrast. Aorta: Normal size. Mild grade 1 atherosclerosis distal to RCA ostium. No dissection. Aortic Valve:  Trileaflet.  No calcifications. Coronary Arteries:  Normal coronary origin.  Right dominance. RCA is a large dominant artery that gives rise to PDA and PLA. There is no plaque or flow limiting disease. RCA was best seen in 35% R-R interval. Left main is a large artery that gives rise to LAD and LCX arteries. There is no flow limiting disease. LAD is a large vessel that gives rise to 3 diagonal branches which are small in caliber. The ostial and proximal LAD demonstrates small areas of calcified plaque with no evidence of flow limitation. LCX is a non-dominant artery that gives rise to one large OM1 branch. There are OM2 and OM3 small caliber branches. There is no flow limiting disease present. Other findings: Normal pulmonary vein drainage into the left atrium. Normal left atrial appendage without a thrombus. Normal size of  the pulmonary artery. IMPRESSION: 1. Coronary calcium score of 12. This was 79 percentile for age and sex matched control. 2. Normal coronary origin with right dominance. 3. No  evidence of flow limiting CAD. Small areas of proximal LAD calcification with no surrounding stenosis. Continue with risk factor modification. Candee Furbish, MD Electronically Signed   By: Candee Furbish MD   On: 05/18/2019 17:12   Result Date: 05/18/2019 EXAM: OVER-READ INTERPRETATION  CT CHEST The following report is an over-read performed by radiologist Dr. Rolm Baptise of [facility] [place] on 05/18/2019. This over-read does not include interpretation of cardiac or coronary anatomy or pathology. The coronary CTA interpretation by the cardiologist is attached. COMPARISON:  None. FINDINGS: Vascular: Heart is normal size.  Visualized aorta normal caliber. Mediastinum/Nodes: No adenopathy in the lower mediastinum and hilum. Small hiatal hernia. Lungs/Pleura: Visualized lungs clear.  No effusions. Upper Abdomen: Imaging into the upper abdomen shows no acute findings. Musculoskeletal: Prior right mastectomy.  No acute bony abnormality. IMPRESSION: No acute extra cardiac abnormality. Small hiatal hernia. Electronically Signed: By: Rolm Baptise M.D. On: 05/18/2019 16:22    Ct Coronary Morph W/cta Cor W/score W/ca W/cm &/or Wo/cm  Addendum Date: 05/18/2019   ADDENDUM REPORT: 05/18/2019 17:12 CLINICAL DATA:  73 year old with chest pain. PAC's noted at baseline. EXAM: Cardiac/Coronary  CTA TECHNIQUE: The patient was scanned on a [employer]. FINDINGS: A 100 kV prospective scan was triggered in the descending thoracic aorta at 111 HU's. Axial non-contrast 3 mm slices were carried out through the heart. The data set was analyzed on a dedicated work station and scored using the [place]. Gantry rotation speed was 250 msecs and collimation was .6 mm. 100 mg of metoprolol beta blockade and 0.8 mg of sl NTG was given. The 3D data set was reconstructed in 5% intervals of the 67-82 % of the R-R cycle. Diastolic phases were analyzed on a dedicated work station using MPR, MIP and VRT modes. The patient  received 80 cc of contrast. Aorta: Normal size. Mild grade 1 atherosclerosis distal to RCA ostium. No dissection. Aortic Valve:  Trileaflet.  No calcifications. Coronary Arteries:  Normal coronary origin.  Right dominance. RCA is a large dominant artery that gives rise to PDA and PLA. There is no plaque or flow limiting disease. RCA was best seen in 35% R-R interval. Left main is a large artery that gives rise to LAD and LCX arteries. There is no flow limiting disease. LAD is a large vessel that gives rise to 3 diagonal branches which are small in caliber. The ostial and proximal LAD demonstrates small areas of calcified plaque with no evidence of flow limitation. LCX is a non-dominant artery that gives rise to one large OM1 branch. There are OM2 and OM3 small caliber branches. There is no flow limiting disease present. Other findings: Normal pulmonary vein drainage into the left atrium. Normal left atrial appendage without a thrombus. Normal size of the pulmonary artery. IMPRESSION: 1. Coronary calcium score of 12. This was 23 percentile for age and sex matched control. 2. Normal coronary origin with right dominance. 3. No evidence of flow limiting CAD. Small areas of proximal LAD calcification with no surrounding stenosis. Continue with risk factor modification. Candee Furbish, MD Electronically Signed   By: Candee Furbish MD   On: 05/18/2019 17:12   Result Date: 05/18/2019 EXAM: OVER-READ INTERPRETATION  CT CHEST The following report is an over-read performed by radiologist Dr. Rolm Baptise of [facility]  [place] on 05/18/2019. This over-read does not include interpretation of cardiac or coronary anatomy or pathology. The coronary CTA interpretation by the cardiologist is attached. COMPARISON:  None. FINDINGS: Vascular: Heart is normal size.  Visualized aorta normal caliber. Mediastinum/Nodes: No adenopathy in the lower mediastinum and hilum. Small hiatal hernia. Lungs/Pleura: Visualized lungs clear.  No  effusions. Upper Abdomen: Imaging into the upper abdomen shows no acute findings. Musculoskeletal: Prior right mastectomy.  No acute bony abnormality. IMPRESSION: No acute extra cardiac abnormality. Small hiatal hernia. Electronically Signed: By: Rolm Baptise M.D. On: 05/18/2019 16:22      ASSESSMENT: 73 y.o. BRCA negative [place] woman  (1) status post right breast lumpectomy and axillary lymph node dissection in 1997 for a stage I breast cancer, treated with adjuvant radiation and tamoxifen for 5 years  (2) status post right breast lower outer quadrant biopsy 07/13/2013 for a clinical T1c N0, stage IA invasive ductal carcinoma, grade 2, estrogen receptor 100% positive, progesterone receptor 13% positive, with an MIB-1 of 33%, and HER-2 amplification by CISH with a HER2/CEP 17 ratio of 3.19, and an average HER-2 copy number per cell of 4.15  (3) status post right mastectomy 07/28/2013 for a pT1c pN0, stage IA invasive ductal carcinoma, grade 3, with close but negative margins. Prognostic panel was not repeated  (a) the patient met with Dr. Harlow Mares and has decided against reconstruction  (4) completed weekly paclitaxel x12 12/06/2913, with trastuzumab/ pertuzumab every 3 weeks; pertuzumab was held with third dose on 11/01/2013 due to diarrhea, tried a half dose on cycle 4 again with diarrhea developing  (5) trastuzumab (started 09/20/2013) continued for 1 year, last dose 09/21/2014;  (a) final echocardiogram 09/07/2014 showed an ejection fraction of 55-60%  (6) anastrozole started May 2015; completed April 2020  (a) bone density 12/15/2013 normal  (b) bone density 02/01/2016 at [facility] was normal with a T score of -1.0   OTHER PROBLEMS: (a) History of chronic lymphoid leukemia diagnosed by flow cytometry 06/29/2013, the cells being CD5, CD20 and CD23 positive, CD10 negative.   (1) right renal mass resected on 02/05/15, consisting of atypical lymphoid proliferation composed of monotonous  small lymphocytes   (2) Anemia with a normal MCV and normal ferritin-- B-12 and folate normal; stable  (3)  ibrutinib 280 milligrams daily started 08/01/202020, discontinued 06/03/2019  (4) rituximab weekly x8 to start 06/15/2019    PLAN: [name] [name]'s confusion I think is going to be due to medications.  I asked her to stop the Xanax and the gabapentin and I dropped her Cymbalta to 60 mg daily.  Hopefully over the next few days her mind will clear and little bit.  Nevertheless I am concerned that she may have been taking ibrutinib incorrectly.  I think it may be safer for her to receive rituximab.  We are going to start that later this month.  Today I went over the possible side effects toxicities like application since agent, which is generally very well-tolerated, but we will probably need to repeat those at some point since I am not sure she will remember much of what we went over today.  She does have a bruise in her mid back.  I obtained a chest x-ray today just to make sure there are no fractures or other findings of concern.  I encouraged her to call us with any questions or concerns before the next visit.  Magrinat, Virgie Dad, MD  06/03/19 4:20 PM Medical Oncology and Hematology [address]  [place], [place] [address] Tel. [phone]    Fax. [phone]  I, Jacqualyn Posey am acting as a Education administrator for Chauncey Cruel, MD.   I, Lurline Del MD, have reviewed the above documentation for accuracy and completeness, and I agree with the above.

## 2019-06-06 ENCOUNTER — Telehealth: Payer: Self-pay | Admitting: Oncology

## 2019-06-06 NOTE — Telephone Encounter (Signed)
Opened by accident

## 2019-06-06 NOTE — Telephone Encounter (Signed)
I left a message regarding schedule  

## 2019-06-15 ENCOUNTER — Inpatient Hospital Stay: Payer: Medicare Other

## 2019-06-15 ENCOUNTER — Other Ambulatory Visit: Payer: Self-pay

## 2019-06-15 ENCOUNTER — Other Ambulatory Visit: Payer: Self-pay | Admitting: Oncology

## 2019-06-15 ENCOUNTER — Inpatient Hospital Stay (HOSPITAL_BASED_OUTPATIENT_CLINIC_OR_DEPARTMENT_OTHER): Payer: Medicare Other | Admitting: Adult Health

## 2019-06-15 ENCOUNTER — Encounter: Payer: Self-pay | Admitting: Adult Health

## 2019-06-15 VITALS — BP 134/70 | HR 106 | Temp 98.0°F | Resp 18 | Ht 64.0 in | Wt 190.1 lb

## 2019-06-15 DIAGNOSIS — F419 Anxiety disorder, unspecified: Secondary | ICD-10-CM | POA: Diagnosis not present

## 2019-06-15 DIAGNOSIS — K219 Gastro-esophageal reflux disease without esophagitis: Secondary | ICD-10-CM

## 2019-06-15 DIAGNOSIS — Z9221 Personal history of antineoplastic chemotherapy: Secondary | ICD-10-CM

## 2019-06-15 DIAGNOSIS — C9110 Chronic lymphocytic leukemia of B-cell type not having achieved remission: Secondary | ICD-10-CM

## 2019-06-15 DIAGNOSIS — C50511 Malignant neoplasm of lower-outer quadrant of right female breast: Secondary | ICD-10-CM | POA: Diagnosis not present

## 2019-06-15 DIAGNOSIS — E785 Hyperlipidemia, unspecified: Secondary | ICD-10-CM | POA: Diagnosis not present

## 2019-06-15 DIAGNOSIS — E039 Hypothyroidism, unspecified: Secondary | ICD-10-CM | POA: Diagnosis not present

## 2019-06-15 DIAGNOSIS — Z5112 Encounter for antineoplastic immunotherapy: Secondary | ICD-10-CM | POA: Diagnosis not present

## 2019-06-15 DIAGNOSIS — F329 Major depressive disorder, single episode, unspecified: Secondary | ICD-10-CM | POA: Diagnosis not present

## 2019-06-15 DIAGNOSIS — Z923 Personal history of irradiation: Secondary | ICD-10-CM | POA: Diagnosis not present

## 2019-06-15 DIAGNOSIS — Z85828 Personal history of other malignant neoplasm of skin: Secondary | ICD-10-CM | POA: Diagnosis not present

## 2019-06-15 DIAGNOSIS — R232 Flushing: Secondary | ICD-10-CM

## 2019-06-15 DIAGNOSIS — R5383 Other fatigue: Secondary | ICD-10-CM | POA: Diagnosis not present

## 2019-06-15 DIAGNOSIS — Z8249 Family history of ischemic heart disease and other diseases of the circulatory system: Secondary | ICD-10-CM

## 2019-06-15 DIAGNOSIS — Z79899 Other long term (current) drug therapy: Secondary | ICD-10-CM

## 2019-06-15 DIAGNOSIS — Z888 Allergy status to other drugs, medicaments and biological substances status: Secondary | ICD-10-CM | POA: Diagnosis not present

## 2019-06-15 DIAGNOSIS — Z170 Estrogen receptor positive status [ER+]: Secondary | ICD-10-CM | POA: Diagnosis not present

## 2019-06-15 LAB — CBC WITH DIFFERENTIAL/PLATELET
Abs Immature Granulocytes: 0.06 10*3/uL (ref 0.00–0.07)
Basophils Absolute: 0.1 10*3/uL (ref 0.0–0.1)
Basophils Relative: 1 %
Eosinophils Absolute: 0.4 10*3/uL (ref 0.0–0.5)
Eosinophils Relative: 2 %
HCT: 36.1 % (ref 36.0–46.0)
Hemoglobin: 11.6 g/dL — ABNORMAL LOW (ref 12.0–15.0)
Immature Granulocytes: 0 %
Lymphocytes Relative: 63 %
Lymphs Abs: 14.9 10*3/uL — ABNORMAL HIGH (ref 0.7–4.0)
MCH: 28.3 pg (ref 26.0–34.0)
MCHC: 32.1 g/dL (ref 30.0–36.0)
MCV: 88 fL (ref 80.0–100.0)
Monocytes Absolute: 2.5 10*3/uL — ABNORMAL HIGH (ref 0.1–1.0)
Monocytes Relative: 10 %
Neutro Abs: 5.7 10*3/uL (ref 1.7–7.7)
Neutrophils Relative %: 24 %
Platelets: 183 10*3/uL (ref 150–400)
RBC: 4.1 MIL/uL (ref 3.87–5.11)
RDW: 14.4 % (ref 11.5–15.5)
WBC: 23.7 10*3/uL — ABNORMAL HIGH (ref 4.0–10.5)
nRBC: 0 % (ref 0.0–0.2)

## 2019-06-15 LAB — COMPREHENSIVE METABOLIC PANEL
ALT: 18 U/L (ref 0–44)
AST: 21 U/L (ref 15–41)
Albumin: 4.4 g/dL (ref 3.5–5.0)
Alkaline Phosphatase: 93 U/L (ref 38–126)
Anion gap: 14 (ref 5–15)
BUN: 14 mg/dL (ref 8–23)
CO2: 20 mmol/L — ABNORMAL LOW (ref 22–32)
Calcium: 9.7 mg/dL (ref 8.9–10.3)
Chloride: 108 mmol/L (ref 98–111)
Creatinine, Ser: 1.17 mg/dL — ABNORMAL HIGH (ref 0.44–1.00)
GFR calc Af Amer: 54 mL/min — ABNORMAL LOW (ref >60)
GFR calc non Af Amer: 46 mL/min — ABNORMAL LOW (ref >60)
Glucose, Bld: 96 mg/dL (ref 70–99)
Potassium: 4.3 mmol/L (ref 3.5–5.1)
Sodium: 142 mmol/L (ref 135–145)
Total Bilirubin: 0.3 mg/dL (ref 0.3–1.2)
Total Protein: 6.9 g/dL (ref 6.5–8.1)

## 2019-06-15 LAB — LACTATE DEHYDROGENASE: LDH: 290 U/L — ABNORMAL HIGH (ref 98–192)

## 2019-06-15 NOTE — Progress Notes (Signed)
ID: [name] OB: [birth year]  MR#: [id number]  CSN#:[id number]  Patient Care Team: Deland Pretty, MD as PCP - General (Internal Medicine) Jerline Pain, MD as PCP - Cardiology (Cardiology) Magrinat, Virgie Dad, MD as Consulting Physician (Oncology) Milus Banister, MD as Attending Physician (Gastroenterology) Megan Salon, MD as Consulting Physician (Gynecology) Donnie Mesa, MD as Consulting Physician (General Surgery) Noemi Chapel, NP as Nurse Practitioner Rod Can, MD as Consulting Physician (Orthopedic Surgery)   CHIEF COMPLAINT: Estrogen receptor positive Right Breast Cancer; chronic lymphoid leukemia  CURRENT TREATMENT: to start rituximab   BREAST CANCER HISTORY: From the prior summary:  [name] [name] has a history of breast cancer dating back to 1997, [name] performed a right lumpectomy and axillary lymph node dissection for what according to the patient and her family was a stage I invasive ductal breast cancer. She received radiation adjuvantly and then took tamoxifen for 5 years  In February of 2014 she had a normal screening mammography, but in November 2014 she felt a "hard lump" in her right breast. She brought this to the attention of her urologist, Dr. Izora Gala, and he set her up for bilateral diagnostic mammography with mammography at [facility] 07/13/2013. This showed her breast density to be category B. There was no mammographic abnormality noted but ultrasonography showed a 1.4 cm irregularly shaped solid mass in the right breast at the 8:00 position. This was biopsied the same day, and showed ([id number] an invasive ductal carcinoma, grade 2, estrogen receptor 100% positive, progesterone receptor 13% positive, with an MIB-1 of 33% and HER-2 amplification with a HER-2: CEP 17 ratio of 3.19, and a HER-2 copy number percent all of 4.15.  The patient's subsequent history is as detailed below   INTERVAL HISTORY: [name] [name] returns today for follow-up and  treatment of her chronic lymphoid leukemia and estrogen receptor positive breast cancer.   She continues under observation as far as her breast cancer is concerned.  Her most recent mammogram was 04/04/2019.  It showed the breast density category B.  There was no evidence of malignancy.  She was due to start Rituximab today.  She is feeling moderately well and has chronic fatigue which Is stable.  She notes that she has intermittent hot flashes, but is otherwise feeling well.    REVIEW OF SYSTEMS: [name] [name] denies any fever chills, unintentional weight loss, decreased appetite, headaches, nasuea, vomiting, bowel/bladder changes, lymphadenopathy, chest pain, palpitations, cough, shortness of breath, or any other issues.  A detailed ROS was otherwise non contributory.    PAST MEDICAL HISTORY: Past Medical History:  Diagnosis Date  . Alopecia   . Anxiety   . Arthritis    "spine" (07/28/2013)  . Breast cancer ([place]) 1997; 2014   right  . CAP (community acquired pneumonia)    admission 06-04-2013, failed outpatient  . Chronic back pain    "lower back and upper neck" (07/28/2013)  . CLL (chronic lymphocytic leukemia) ([place]) 05/2013  . DDD (degenerative disc disease)   . 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 ear   . Depression   . Diverticulosis   . Elevated LFTs   . Fibromyalgia 1978  . GERD (gastroesophageal reflux disease)   . Hematuria   . History of syncope    episode 2008--  no recurrence since  . HLD (hyperlipidemia)   . Hypothyroidism   . Kidney stones 2014  . Plantar fasciitis   . Ruptured disk    one in neck and two in back  . S/P  chemotherapy, time since greater than 12 weeks   . Sinus tachycardia    mild resting  . Skin cancer   . Stool incontinence    "at times recently"     PAST SURGICAL HISTORY: Past Surgical History:  Procedure Laterality Date  . ANTERIOR LATERAL LUMBAR FUSION 4 LEVELS Left 04/25/2016   Procedure: LEFT LUMBAR ONE-TWO, LUMBAR TWO-THREE, LUMBAR THREE-FOUR, LUMBAR  FOUR-FIVE ANTERIOR LATERAL LUMBAR FUSION;  Surgeon: Earnie Larsson, MD;  Location: [facility];  Service: Neurosurgery;  Laterality: Left;  . APPENDECTOMY  1997  . APPLICATION OF ROBOTIC ASSISTANCE FOR SPINAL PROCEDURE N/A 04/06/2017   Procedure: APPLICATION OF ROBOTIC ASSISTANCE FOR SPINAL PROCEDURE;  Surgeon: Earnie Larsson, MD;  Location: [place];  Service: Neurosurgery;  Laterality: N/A;  . BREAST BIOPSY Right 2014  . BREAST LUMPECTOMY Right 1997  . BREAST LUMPECTOMY WITH AXILLARY LYMPH NODE DISSECTION Right 1997  . CATARACT EXTRACTION, BILATERAL  2019   Dr. Kathlen Mody  . CYSTOSCOPY WITH RETROGRADE PYELOGRAM, URETEROSCOPY AND STENT PLACEMENT Bilateral 06/17/2013   Procedure: CYSTOSCOPY WITH RETROGRADE PYELOGRAM, URETEROSCOPY AND LEFT DOUBLE  J STENT PLACEMENT RIGHT URETERAL HOLMIIUM LASER AND DOUBLE J STENT ;  Surgeon: Hanley Ben, MD;  Location: [facility];  Service: Urology;  Laterality: Bilateral;  . HOLMIUM LASER APPLICATION Bilateral 27/74/1287   Procedure: HOLMIUM LASER APPLICATION;  Surgeon: Hanley Ben, MD;  Location: [facility];  Service: Urology;  Laterality: Bilateral;  . LITHOTRIPSY Left 06/2013  . LUMBAR EPIDURAL INJECTION     has had 7 injections  . PORT-A-CATH REMOVAL Left 10/03/2014   Procedure: REMOVAL PORT-A-CATH;  Surgeon: Donnie Mesa, MD;  Location: [place];  Service: General;  Laterality: Left;  . PORTACATH PLACEMENT Left 07/28/2013   Procedure: ATTEMPTED INSERTION PORT-A-CATH;  Surgeon: Imogene Burn. Georgette Dover, MD;  Location: [place];  Service: General;  Laterality: Left;  . POSTERIOR LUMBAR FUSION 4 LEVEL N/A 04/25/2016   Procedure: LUMBAR FIVE-SACRAL ONE POSTERIOR LUMBAR INTERBODY FUSION, THORACIC NINE-SACRAL ONE POSTERIOR LATERAL ARTHRODESIS WITH PEDICLE SCREWS;  Surgeon: Earnie Larsson, MD;  Location: [facility];  Service: Neurosurgery;  Laterality: N/A;  . RETINAL DETACHMENT SURGERY Right 2013  . ROBOTIC ASSITED PARTIAL NEPHRECTOMY  Right 02/05/2015   Procedure: ROBOTIC ASSITED PARTIAL NEPHRECTOMY;  Surgeon: Raynelle Bring, MD;  Location: [facility];  Service: Urology;  Laterality: Right;  . SIMPLE MASTECTOMY WITH AXILLARY SENTINEL NODE BIOPSY Right 07/28/2013   Procedure: RIGHT TOTAL  MASTECTOMY;  Surgeon: Imogene Burn. Georgette Dover, MD;  Location: [facility];  Service: General;  Laterality: Right;  . TONSILLECTOMY  AGE 4  . TOTAL ABDOMINAL HYSTERECTOMY W/ BILATERAL SALPINGOOPHORECTOMY  1997    FAMILY HISTORY Family History  Problem Relation Age of Onset  . Heart disease Maternal Grandfather   . Diabetes Maternal Grandfather   . Colon cancer Maternal Aunt 79  . Brain cancer Paternal Grandmother        dx in 37s  . Dementia Mother   . Diabetes Mother   . Osteoporosis Mother   . Diabetes Maternal Aunt   . Prostate cancer Father 17  . Bipolar disorder Maternal Aunt   . Stroke Maternal Aunt   . Stomach cancer Paternal Uncle        dx in late 51s   the patient's mother died at the age of 59. The patient's father is alive at age 52. She had no brothers or sisters. Her father has a history of prostate cancer. There is no history of breast or ovarian  cancer in the family. One maternal first cousin has a history of Hodgkin's lymphoma.  GYNECOLOGIC HISTORY:  Menarche age 67, first live birth age 87. She is GX P1. She underwent total abdominal hysterectomy and bilateral salpingo-oophorectomy in 1997 she did not take hormone replacement.  SOCIAL HISTORY: (Updated  11/15/2013)  She is a homemaker. Her husband Arnette Norris farms approximately 500 acres, including quite a bit of tobacco. Daughter Colletta [place] lives in [place] where she works as a Pension scheme manager for a drug company. The patient has no grandchildren. She has two "grand cats". She attends a local united church of [place]: Not in place   HEALTH MAINTENANCE:  (Updated   11/15/2013 ) Social History   Tobacco Use  . Smoking status: Never  Smoker  . Smokeless tobacco: Never Used  Substance Use Topics  . Alcohol use: No    Comment: 07/28/2013 "no alcohol since 1994; never had problem w/it"  . Drug use: No     Colonoscopy: 2009/ [facility]  PAP: Status post hysterectomy  Bone density: [facility] 10/14/[facility] was normal  Lipid panel:  Not on file   Allergies  Allergen Reactions  . Lasix [Furosemide] Nausea And Vomiting and Other (See Comments)    HEADACHE  . Lithium Other (See Comments)    Dizziness, "cause me to fall"  . Sulfa Antibiotics Hives  . Trazodone And Nefazodone Other (See Comments)    insomnia  . Lyrica [Pregabalin] Diarrhea, Nausea Only and Rash    Current Outpatient Medications  Medication Sig Dispense Refill  . allopurinol (ZYLOPRIM) 300 MG tablet Take 1 tablet (300 mg total) by mouth daily. 90 tablet 4  . amitriptyline (ELAVIL) 150 MG tablet Take 150 mg by mouth at bedtime.    . DULoxetine (CYMBALTA) 30 MG capsule Take 2 capsules (60 mg total) by mouth daily after breakfast.    . levothyroxine (SYNTHROID, LEVOTHROID) 88 MCG tablet Take 88 mcg by mouth daily before breakfast.   4  . metoprolol tartrate (LOPRESSOR) 100 MG tablet Take 1 tablet (100 mg) by mouth once 2 hours prior to procedure 1 tablet 0  . omeprazole (PRILOSEC) 40 MG capsule TAKE 1 CAPSULE(40 MG) BY MOUTH DAILY AFTER BREAKFAST 90 capsule 0  . REXULTI 2 MG TABS Take 2 mg by mouth at bedtime.   1  . rosuvastatin (CRESTOR) 5 MG tablet Take 1 tablet (5 mg total) by mouth daily. 90 tablet 3   No current facility-administered medications for this visit.     OBJECTIVE:  Vitals:   06/15/19 0911  BP: 134/70  Pulse: (!) 106  Resp: 18  Temp: 98 F (36.7 C)  SpO2: 99%   Wt Readings from Last 3 Encounters:  06/15/19 190 lb 1.6 oz (86.2 kg)  06/03/19 195 lb 1.6 oz (88.5 kg)  05/06/19 199 lb 1.6 oz (90.3 kg)   Body mass index is 32.63 kg/m.    ECOG FS:1 - Symptomatic but completely ambulatory GENERAL: Patient is a well appearing female  in no acute distress HEENT:  Sclerae anicteric.  Oropharynx clear and moist. No ulcerations or evidence of oropharyngeal candidiasis. Neck is supple.  NODES:  No cervical, supraclavicular, or axillary lymphadenopathy palpated.  BREAST EXAM:  Deferred. LUNGS:  Clear to auscultation bilaterally.  No wheezes or rhonchi. HEART:  Regular rate and rhythm. No murmur appreciated. ABDOMEN:  Soft, nontender.  Positive, normoactive bowel sounds. No organomegaly palpated. MSK:  No focal spinal tenderness to palpation. Full range of motion  bilaterally in the upper extremities. EXTREMITIES:  No peripheral edema.   SKIN:  Clear with no obvious rashes or skin changes. No nail dyscrasia. NEURO:  Nonfocal. Well oriented.  Appropriate affect.     LAB RESULTS:  Lab Results  Component Value Date   WBC 23.7 (H) 06/15/2019   NEUTROABS 5.7 06/15/2019   HGB 11.6 (L) 06/15/2019   HCT 36.1 06/15/2019   MCV 88.0 06/15/2019   PLT 183 06/15/2019      Chemistry      Component Value Date/Time   NA 142 06/15/2019 0831   NA 140 03/25/2017 1300   K 4.3 06/15/2019 0831   K 4.2 03/25/2017 1300   CL 108 06/15/2019 0831   CO2 20 (L) 06/15/2019 0831   CO2 24 03/25/2017 1300   BUN 14 06/15/2019 0831   BUN 17.9 03/25/2017 1300   CREATININE 1.17 (H) 06/15/2019 0831   CREATININE 1.12 (H) 06/03/2019 1150   CREATININE 1.1 03/25/2017 1300      Component Value Date/Time   CALCIUM 9.7 06/15/2019 0831   CALCIUM 9.4 03/25/2017 1300   ALKPHOS 93 06/15/2019 0831   ALKPHOS 102 03/25/2017 1300   AST 21 06/15/2019 0831   AST 17 06/03/2019 1150   AST 41 (H) 03/25/2017 1300   ALT 18 06/15/2019 0831   ALT 13 06/03/2019 1150   ALT 52 03/25/2017 1300   BILITOT 0.3 06/15/2019 0831   BILITOT 0.3 06/03/2019 1150   BILITOT 0.36 03/25/2017 1300      STUDIES: Dg Chest 2 View  Result Date: 06/03/2019 CLINICAL DATA:  Shortness of breath and falling lately EXAM: CHEST - 2 VIEW COMPARISON:  Chest radiograph dated 09/16/2018  FINDINGS: Stable cardiomediastinal contours with normal heart size. The lungs are clear. No pneumothorax or pleural effusion. Surgical clips are noted overlying the right axilla. Extensive spinal hardware. No evidence of a displaced rib fracture. IMPRESSION: No evidence of a displaced rib fracture or pneumothorax. Electronically Signed   By: Audie Pinto M.D.   On: 06/03/2019 21:47   Ct Coronary Morph W/cta Cor W/score W/ca W/cm &/or Wo/cm  Addendum Date: 05/18/2019   ADDENDUM REPORT: 05/18/2019 17:12 CLINICAL DATA:  73 year old with chest pain. PAC's noted at baseline. EXAM: Cardiac/Coronary  CTA TECHNIQUE: The patient was scanned on a [employer]. FINDINGS: A 100 kV prospective scan was triggered in the descending thoracic aorta at 111 HU's. Axial non-contrast 3 mm slices were carried out through the heart. The data set was analyzed on a dedicated work station and scored using the [place]. Gantry rotation speed was 250 msecs and collimation was .6 mm. 100 mg of metoprolol beta blockade and 0.8 mg of sl NTG was given. The 3D data set was reconstructed in 5% intervals of the 67-82 % of the R-R cycle. Diastolic phases were analyzed on a dedicated work station using MPR, MIP and VRT modes. The patient received 80 cc of contrast. Aorta: Normal size. Mild grade 1 atherosclerosis distal to RCA ostium. No dissection. Aortic Valve:  Trileaflet.  No calcifications. Coronary Arteries:  Normal coronary origin.  Right dominance. RCA is a large dominant artery that gives rise to PDA and PLA. There is no plaque or flow limiting disease. RCA was best seen in 35% R-R interval. Left main is a large artery that gives rise to LAD and LCX arteries. There is no flow limiting disease. LAD is a large vessel that gives rise to 3 diagonal branches which are small in caliber. The  ostial and proximal LAD demonstrates small areas of calcified plaque with no evidence of flow limitation. LCX is a non-dominant artery  that gives rise to one large OM1 branch. There are OM2 and OM3 small caliber branches. There is no flow limiting disease present. Other findings: Normal pulmonary vein drainage into the left atrium. Normal left atrial appendage without a thrombus. Normal size of the pulmonary artery. IMPRESSION: 1. Coronary calcium score of 12. This was 57 percentile for age and sex matched control. 2. Normal coronary origin with right dominance. 3. No evidence of flow limiting CAD. Small areas of proximal LAD calcification with no surrounding stenosis. Continue with risk factor modification. Candee Furbish, MD Electronically Signed   By: Candee Furbish MD   On: 05/18/2019 17:12   Result Date: 05/18/2019 EXAM: OVER-READ INTERPRETATION  CT CHEST The following report is an over-read performed by radiologist Dr. Rolm Baptise of [facility], [place] on 05/18/2019. This over-read does not include interpretation of cardiac or coronary anatomy or pathology. The coronary CTA interpretation by the cardiologist is attached. COMPARISON:  None. FINDINGS: Vascular: Heart is normal size.  Visualized aorta normal caliber. Mediastinum/Nodes: No adenopathy in the lower mediastinum and hilum. Small hiatal hernia. Lungs/Pleura: Visualized lungs clear.  No effusions. Upper Abdomen: Imaging into the upper abdomen shows no acute findings. Musculoskeletal: Prior right mastectomy.  No acute bony abnormality. IMPRESSION: No acute extra cardiac abnormality. Small hiatal hernia. Electronically Signed: By: Rolm Baptise M.D. On: 05/18/2019 16:22    Dg Chest 2 View  Result Date: 06/03/2019 CLINICAL DATA:  Shortness of breath and falling lately EXAM: CHEST - 2 VIEW COMPARISON:  Chest radiograph dated 09/16/2018 FINDINGS: Stable cardiomediastinal contours with normal heart size. The lungs are clear. No pneumothorax or pleural effusion. Surgical clips are noted overlying the right axilla. Extensive spinal hardware. No evidence of a displaced rib fracture.  IMPRESSION: No evidence of a displaced rib fracture or pneumothorax. Electronically Signed   By: Audie Pinto M.D.   On: 06/03/2019 21:47   Ct Coronary Morph W/cta Cor W/score W/ca W/cm &/or Wo/cm  Addendum Date: 05/18/2019   ADDENDUM REPORT: 05/18/2019 17:12 CLINICAL DATA:  73 year old with chest pain. PAC's noted at baseline. EXAM: Cardiac/Coronary  CTA TECHNIQUE: The patient was scanned on a [employer]. FINDINGS: A 100 kV prospective scan was triggered in the descending thoracic aorta at 111 HU's. Axial non-contrast 3 mm slices were carried out through the heart. The data set was analyzed on a dedicated work station and scored using the [place]. Gantry rotation speed was 250 msecs and collimation was .6 mm. 100 mg of metoprolol beta blockade and 0.8 mg of sl NTG was given. The 3D data set was reconstructed in 5% intervals of the 67-82 % of the R-R cycle. Diastolic phases were analyzed on a dedicated work station using MPR, MIP and VRT modes. The patient received 80 cc of contrast. Aorta: Normal size. Mild grade 1 atherosclerosis distal to RCA ostium. No dissection. Aortic Valve:  Trileaflet.  No calcifications. Coronary Arteries:  Normal coronary origin.  Right dominance. RCA is a large dominant artery that gives rise to PDA and PLA. There is no plaque or flow limiting disease. RCA was best seen in 35% R-R interval. Left main is a large artery that gives rise to LAD and LCX arteries. There is no flow limiting disease. LAD is a large vessel that gives rise to 3 diagonal branches which are small in caliber. The ostial and proximal  LAD demonstrates small areas of calcified plaque with no evidence of flow limitation. LCX is a non-dominant artery that gives rise to one large OM1 branch. There are OM2 and OM3 small caliber branches. There is no flow limiting disease present. Other findings: Normal pulmonary vein drainage into the left atrium. Normal left atrial appendage without a thrombus.  Normal size of the pulmonary artery. IMPRESSION: 1. Coronary calcium score of 12. This was 75 percentile for age and sex matched control. 2. Normal coronary origin with right dominance. 3. No evidence of flow limiting CAD. Small areas of proximal LAD calcification with no surrounding stenosis. Continue with risk factor modification. Candee Furbish, MD Electronically Signed   By: Candee Furbish MD   On: 05/18/2019 17:12   Result Date: 05/18/2019 EXAM: OVER-READ INTERPRETATION  CT CHEST The following report is an over-read performed by radiologist Dr. Rolm Baptise of [facility], [place] on 05/18/2019. This over-read does not include interpretation of cardiac or coronary anatomy or pathology. The coronary CTA interpretation by the cardiologist is attached. COMPARISON:  None. FINDINGS: Vascular: Heart is normal size.  Visualized aorta normal caliber. Mediastinum/Nodes: No adenopathy in the lower mediastinum and hilum. Small hiatal hernia. Lungs/Pleura: Visualized lungs clear.  No effusions. Upper Abdomen: Imaging into the upper abdomen shows no acute findings. Musculoskeletal: Prior right mastectomy.  No acute bony abnormality. IMPRESSION: No acute extra cardiac abnormality. Small hiatal hernia. Electronically Signed: By: Rolm Baptise M.D. On: 05/18/2019 16:22      ASSESSMENT: 74 y.o. BRCA negative [place] woman  (1) status post right breast lumpectomy and axillary lymph node dissection in 1997 for a stage I breast cancer, treated with adjuvant radiation and tamoxifen for 5 years  (2) status post right breast lower outer quadrant biopsy 07/13/2013 for a clinical T1c N0, stage IA invasive ductal carcinoma, grade 2, estrogen receptor 100% positive, progesterone receptor 13% positive, with an MIB-1 of 33%, and HER-2 amplification by [facility] with a HER2/CEP 17 ratio of 3.19, and an average HER-2 copy number per cell of 4.15  (3) status post right mastectomy 07/28/2013 for a pT1c pN0, stage IA invasive ductal  carcinoma, grade 3, with close but negative margins. Prognostic panel was not repeated  (a) the patient met with Dr. Harlow Mares and has decided against reconstruction  (4) completed weekly paclitaxel x12 12/06/2913, with trastuzumab/ pertuzumab every 3 weeks; pertuzumab was held with third dose on 11/01/2013 due to diarrhea, tried a half dose on cycle 4 again with diarrhea developing  (5) trastuzumab (started 09/20/2013) continued for 1 year, last dose 09/21/2014;  (a) final echocardiogram 09/07/2014 showed an ejection fraction of 55-60%  (6) anastrozole started May 2015; completed April 2020  (a) bone density 12/15/2013 normal  (b) bone density 02/01/2016 at [facility] was normal with a T score of -1.0   OTHER PROBLEMS: (a) History of chronic lymphoid leukemia diagnosed by flow cytometry 06/29/2013, the cells being CD5, CD20 and CD23 positive, CD10 negative.   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  Her labs are stable today and her WBC is improved. I reviewed this with her in detail.    She unfortunately cannot receive treatment today.  I explained to  her that we were delayed in getting her orders in and we are still waiting on insurance authorization.  She will start the rituximab next week.  We discussed common side effects of the Rituximab in detail.    Marcie Bal and I reviewed healthy diet, exercise in the meantime.  I apologized to her about the delay.  We updated her schedule.  We will see her back in 1 week for labs, f/u with Dr. Jana Hakim.  She was recommended to continue with the appropriate pandemic precautions. She knows to call for any questions that may  arise between now and her next appointment.  We are happy to see her sooner if needed.  A total of (20) minutes of face-to-face time was spent with this patient with greater than 50% of that time in counseling and care-coordination.   Wilber Bihari, NP  06/16/19 7:57 AM Medical Oncology and Hematology [facility] [place], [place] [address] Tel. [phone]    Fax. [phone]

## 2019-06-16 LAB — BETA 2 MICROGLOBULIN, SERUM: Beta-2 Microglobulin: 4.5 mg/L — ABNORMAL HIGH (ref 0.6–2.4)

## 2019-06-19 ENCOUNTER — Other Ambulatory Visit: Payer: Self-pay | Admitting: Oncology

## 2019-06-21 DIAGNOSIS — H35373 Puckering of macula, bilateral: Secondary | ICD-10-CM | POA: Diagnosis not present

## 2019-06-21 DIAGNOSIS — H43813 Vitreous degeneration, bilateral: Secondary | ICD-10-CM | POA: Diagnosis not present

## 2019-06-21 DIAGNOSIS — H35363 Drusen (degenerative) of macula, bilateral: Secondary | ICD-10-CM | POA: Diagnosis not present

## 2019-06-21 DIAGNOSIS — H35033 Hypertensive retinopathy, bilateral: Secondary | ICD-10-CM | POA: Diagnosis not present

## 2019-06-21 NOTE — Progress Notes (Signed)
ID: [name] OB: Jun 21, 1946  MR#: [id number]  CSN#:[id number]  Patient Care Team: Deland Pretty, MD as PCP - General (Internal Medicine) Jerline Pain, MD as PCP - Cardiology (Cardiology) Magrinat, Virgie Dad, MD as Consulting Physician (Oncology) Milus Banister, MD as Attending Physician (Gastroenterology) Megan Salon, MD as Consulting Physician (Gynecology) Donnie Mesa, MD as Consulting Physician (General Surgery) Noemi Chapel, NP as Nurse Practitioner Rod Can, MD as Consulting Physician (Orthopedic Surgery)   CHIEF COMPLAINT: Estrogen receptor positive Right Breast Cancer; chronic lymphoid leukemia  CURRENT TREATMENT: rituximab   INTERVAL HISTORY: [name] [name] returns today for follow-up and treatment of her chronic lymphoid leukemia and estrogen receptor positive breast cancer.   She continues under observation as far as her breast cancer is concerned.  Her most recent mammogram was 04/04/2019.  It showed the breast density category B.  There was no evidence of malignancy.  She is here today to begin Rituximab.   REVIEW OF SYSTEMS: Anairis has had no fevers rash bleeding pruritus weight loss or unusual fatigue.  She is not aware of any adenopathy.  She remains slightly vague in her answers.  A detailed review of systems today was otherwise stable.   BREAST CANCER HISTORY: From the prior summary:  Letina has a history of breast cancer dating back to 1997, [name] performed a right lumpectomy and axillary lymph node dissection for what according to the patient and her family was a stage I invasive ductal breast cancer. She received radiation adjuvantly and then took tamoxifen for 5 years  In February of 2014 she had a normal screening mammography, but in November 2014 she felt a "hard lump" in her right breast. She brought this to the attention of her urologist, Dr. Izora Gala, and he set her up for bilateral diagnostic mammography with mammography at [facility]  07/13/2013. This showed her breast density to be category B. There was no mammographic abnormality noted but ultrasonography showed a 1.4 cm irregularly shaped solid mass in the right breast at the 8:00 position. This was biopsied the same day, and showed ([id number]) an invasive ductal carcinoma, grade 2, estrogen receptor 100% positive, progesterone receptor 13% positive, with an MIB-1 of 33% and HER-2 amplification with a HER-2: CEP 17 ratio of 3.19, and a HER-2 copy number percent all of 4.15.  The patient's subsequent history is as detailed below     PAST MEDICAL HISTORY: Past Medical History:  Diagnosis Date  . Alopecia   . Anxiety   . Arthritis    "spine" (07/28/2013)  . Breast cancer ([place]) 1997; 2014   right  . CAP (community acquired pneumonia)    admission 06-04-2013, failed outpatient  . Chronic back pain    "lower back and upper neck" (07/28/2013)  . CLL (chronic lymphocytic leukemia) ([place]) 05/2013  . DDD (degenerative disc disease)   . 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 ear   . Depression   . Diverticulosis   . Elevated LFTs   . Fibromyalgia 1978  . GERD (gastroesophageal reflux disease)   . Hematuria   . History of syncope    episode 2008--  no recurrence since  . HLD (hyperlipidemia)   . Hypothyroidism   . Kidney stones 2014  . Plantar fasciitis   . Ruptured disk    one in neck and two in back  . S/P chemotherapy, time since greater than 12 weeks   . Sinus tachycardia    mild resting  . Skin cancer   . Stool  incontinence    "at times recently"     PAST SURGICAL HISTORY: Past Surgical History:  Procedure Laterality Date  . ANTERIOR LATERAL LUMBAR FUSION 4 LEVELS Left 04/25/2016   Procedure: LEFT LUMBAR ONE-TWO, LUMBAR TWO-THREE, LUMBAR THREE-FOUR, LUMBAR FOUR-FIVE ANTERIOR LATERAL LUMBAR FUSION;  Surgeon: Earnie Larsson, MD;  Location: [place] [facility];  Service: Neurosurgery;  Laterality: Left;  . APPENDECTOMY  1997  . APPLICATION OF ROBOTIC ASSISTANCE FOR SPINAL PROCEDURE N/A  04/06/2017   Procedure: APPLICATION OF ROBOTIC ASSISTANCE FOR SPINAL PROCEDURE;  Surgeon: Earnie Larsson, MD;  Location: [facility];  Service: Neurosurgery;  Laterality: N/A;  . BREAST BIOPSY Right 2014  . BREAST LUMPECTOMY Right 1997  . BREAST LUMPECTOMY WITH AXILLARY LYMPH NODE DISSECTION Right 1997  . CATARACT EXTRACTION, BILATERAL  2019   Dr. Kathlen Mody  . CYSTOSCOPY WITH RETROGRADE PYELOGRAM, URETEROSCOPY AND STENT PLACEMENT Bilateral 06/17/2013   Procedure: CYSTOSCOPY WITH RETROGRADE PYELOGRAM, URETEROSCOPY AND LEFT DOUBLE  J STENT PLACEMENT RIGHT URETERAL HOLMIIUM LASER AND DOUBLE J STENT ;  Surgeon: Hanley Ben, MD;  Location: [facility];  Service: Urology;  Laterality: Bilateral;  . HOLMIUM LASER APPLICATION Bilateral 77/93/9030   Procedure: HOLMIUM LASER APPLICATION;  Surgeon: Hanley Ben, MD;  Location: [place];  Service: Urology;  Laterality: Bilateral;  . LITHOTRIPSY Left 06/2013  . LUMBAR EPIDURAL INJECTION     has had 7 injections  . PORT-A-CATH REMOVAL Left 10/03/2014   Procedure: REMOVAL PORT-A-CATH;  Surgeon: Donnie Mesa, MD;  Location: [place];  Service: General;  Laterality: Left;  . PORTACATH PLACEMENT Left 07/28/2013   Procedure: ATTEMPTED INSERTION PORT-A-CATH;  Surgeon: Imogene Burn. Georgette Dover, MD;  Location: [facility];  Service: General;  Laterality: Left;  . POSTERIOR LUMBAR FUSION 4 LEVEL N/A 04/25/2016   Procedure: LUMBAR FIVE-SACRAL ONE POSTERIOR LUMBAR INTERBODY FUSION, THORACIC NINE-SACRAL ONE POSTERIOR LATERAL ARTHRODESIS WITH PEDICLE SCREWS;  Surgeon: Earnie Larsson, MD;  Location: [place] [facility];  Service: Neurosurgery;  Laterality: N/A;  . RETINAL DETACHMENT SURGERY Right 2013  . ROBOTIC ASSITED PARTIAL NEPHRECTOMY Right 02/05/2015   Procedure: ROBOTIC ASSITED PARTIAL NEPHRECTOMY;  Surgeon: Raynelle Bring, MD;  Location: [facility];  Service: Urology;  Laterality: Right;  . SIMPLE MASTECTOMY WITH AXILLARY SENTINEL NODE BIOPSY Right  07/28/2013   Procedure: RIGHT TOTAL  MASTECTOMY;  Surgeon: Imogene Burn. Georgette Dover, MD;  Location: [place];  Service: General;  Laterality: Right;  . TONSILLECTOMY  AGE 35  . TOTAL ABDOMINAL HYSTERECTOMY W/ BILATERAL SALPINGOOPHORECTOMY  1997    FAMILY HISTORY Family History  Problem Relation Age of Onset  . Heart disease Maternal Grandfather   . Diabetes Maternal Grandfather   . Colon cancer Maternal Aunt 79  . Brain cancer Paternal Grandmother        dx in 55s  . Dementia Mother   . Diabetes Mother   . Osteoporosis Mother   . Diabetes Maternal Aunt   . Prostate cancer Father 78  . Bipolar disorder Maternal Aunt   . Stroke Maternal Aunt   . Stomach cancer Paternal Uncle        dx in late 47s   the patient's mother died at the age of 16. The patient's father is alive at age 65. She had no brothers or sisters. Her father has a history of prostate cancer. There is no history of breast or ovarian cancer in the family. One maternal first cousin has a history of Hodgkin's lymphoma.   GYNECOLOGIC HISTORY:  Menarche age 62, first live birth  age 68. She is GX P1. She underwent total abdominal hysterectomy and bilateral salpingo-oophorectomy in 1997 she did not take hormone replacement.   SOCIAL HISTORY: (Updated  11/15/2013)  She is a homemaker. Her husband Arnette Norris farms approximately 500 acres, including quite a bit of tobacco. 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 The patient has no grandchildren. She has two "grand cats". She attends a local united church of [place]: Not in place   HEALTH MAINTENANCE:  Social History   Tobacco Use  . Smoking status: Never Smoker  . Smokeless tobacco: Never Used  Substance Use Topics  . Alcohol use: No    Comment: 07/28/2013 "no alcohol since 1994; never had problem w/it"  . Drug use: No     Colonoscopy: 2009/ [facility]  PAP: Status post  hysterectomy  Bone density: [facility] 10/14/2009 at [facility] was normal  Lipid panel:  Not on file   Allergies  Allergen Reactions  . Lasix [Furosemide] Nausea And Vomiting and Other (See Comments)    HEADACHE  . Lithium Other (See Comments)    Dizziness, "cause me to fall"  . Sulfa Antibiotics Hives  . Trazodone And Nefazodone Other (See Comments)    insomnia  . Lyrica [Pregabalin] Diarrhea, Nausea Only and Rash    Current Outpatient Medications  Medication Sig Dispense Refill  . allopurinol (ZYLOPRIM) 300 MG tablet Take 1 tablet (300 mg total) by mouth daily. 90 tablet 4  . amitriptyline (ELAVIL) 150 MG tablet Take 150 mg by mouth at bedtime.    . DULoxetine (CYMBALTA) 30 MG capsule Take 2 capsules (60 mg total) by mouth daily after breakfast.    . levothyroxine (SYNTHROID, LEVOTHROID) 88 MCG tablet Take 88 mcg by mouth daily before breakfast.   4  . metoprolol tartrate (LOPRESSOR) 100 MG tablet Take 1 tablet (100 mg) by mouth once 2 hours prior to procedure 1 tablet 0  . omeprazole (PRILOSEC) 40 MG capsule TAKE 1 CAPSULE(40 MG) BY MOUTH DAILY AFTER BREAKFAST 90 capsule 0  . REXULTI 2 MG TABS Take 2 mg by mouth at bedtime.   1  . rosuvastatin (CRESTOR) 5 MG tablet Take 1 tablet (5 mg total) by mouth daily. 90 tablet 3   No current facility-administered medications for this visit.     OBJECTIVE: Middle-aged white woman in no acute distress  Vitals:   06/22/19 1128  BP: 128/73  Pulse: (!) 101  Resp: 18  Temp: 98 F (36.7 C)  SpO2: 99%   Wt Readings from Last 3 Encounters:  06/22/19 187 lb 4.8 oz (85 kg)  06/15/19 190 lb 1.6 oz (86.2 kg)  06/03/19 195 lb 1.6 oz (88.5 kg)   Body mass index is 32.15 kg/m.    ECOG FS:1 - Symptomatic but completely ambulatory  Sclerae unicteric, EOMs intact Wearing a mask No cervical or supraclavicular adenopathy Lungs no rales or rhonchi Heart regular rate and rhythm Abd soft, nontender, positive bowel sounds MSK no focal spinal  tenderness, no upper extremity lymphedema Neuro: nonfocal, well oriented, appropriate affect Breasts: The right breast is status post mastectomy.  There is no evidence of local recurrence.  The left breast is benign.  Both axillae are benign.  LAB RESULTS:  Lab Results  Component Value Date   WBC 19.4 (H) 06/22/2019   NEUTROABS 5.5 06/22/2019   HGB 12.3 06/22/2019   HCT 38.7 06/22/2019   MCV 87.8 06/22/2019   PLT  235 06/22/2019      Chemistry      Component Value Date/Time   NA 142 06/15/2019 0831   NA 140 03/25/2017 1300   K 4.3 06/15/2019 0831   K 4.2 03/25/2017 1300   CL 108 06/15/2019 0831   CO2 20 (L) 06/15/2019 0831   CO2 24 03/25/2017 1300   BUN 14 06/15/2019 0831   BUN 17.9 03/25/2017 1300   CREATININE 1.17 (H) 06/15/2019 0831   CREATININE 1.12 (H) 06/03/2019 1150   CREATININE 1.1 03/25/2017 1300      Component Value Date/Time   CALCIUM 9.7 06/15/2019 0831   CALCIUM 9.4 03/25/2017 1300   ALKPHOS 93 06/15/2019 0831   ALKPHOS 102 03/25/2017 1300   AST 21 06/15/2019 0831   AST 17 06/03/2019 1150   AST 41 (H) 03/25/2017 1300   ALT 18 06/15/2019 0831   ALT 13 06/03/2019 1150   ALT 52 03/25/2017 1300   BILITOT 0.3 06/15/2019 0831   BILITOT 0.3 06/03/2019 1150   BILITOT 0.36 03/25/2017 1300      STUDIES: Dg Chest 2 View  Result Date: 06/03/2019 CLINICAL DATA:  Shortness of breath and falling lately EXAM: CHEST - 2 VIEW COMPARISON:  Chest radiograph dated 09/16/2018 FINDINGS: Stable cardiomediastinal contours with normal heart size. The lungs are clear. No pneumothorax or pleural effusion. Surgical clips are noted overlying the right axilla. Extensive spinal hardware. No evidence of a displaced rib fracture. IMPRESSION: No evidence of a displaced rib fracture or pneumothorax. Electronically Signed   By: Audie Pinto M.D.   On: 06/03/2019 21:47    Dg Chest 2 View  Result Date: 06/03/2019 CLINICAL DATA:  Shortness of breath and falling lately EXAM: CHEST -  2 VIEW COMPARISON:  Chest radiograph dated 09/16/2018 FINDINGS: Stable cardiomediastinal contours with normal heart size. The lungs are clear. No pneumothorax or pleural effusion. Surgical clips are noted overlying the right axilla. Extensive spinal hardware. No evidence of a displaced rib fracture. IMPRESSION: No evidence of a displaced rib fracture or pneumothorax. Electronically Signed   By: Audie Pinto M.D.   On: 06/03/2019 21:47      ASSESSMENT: 73 y.o. BRCA negative [place] woman  (1) status post right breast lumpectomy and axillary lymph node dissection in 1997 for a stage I breast cancer, treated with adjuvant radiation and tamoxifen for 5 years  (2) status post right breast lower outer quadrant biopsy 07/13/2013 for a clinical T1c N0, stage IA invasive ductal carcinoma, grade 2, estrogen receptor 100% positive, progesterone receptor 13% positive, with an MIB-1 of 33%, and HER-2 amplification by C[facility] with a HER2/CEP 17 ratio of 3.19, and an average HER-2 copy number per cell of 4.15  (3) status post right mastectomy 07/28/2013 for a pT1c pN0, stage IA invasive ductal carcinoma, grade 3, with close but negative margins. Prognostic panel was not repeated  (a) the patient met with Dr. Harlow Mares and has decided against reconstruction  (4) completed weekly paclitaxel x12 12/06/2913, with trastuzumab/ pertuzumab every 3 weeks; pertuzumab was held with third dose on 11/01/2013 due to diarrhea, tried a half dose on cycle 4 again with diarrhea developing  (5) trastuzumab (started 09/20/2013) continued for 1 year, last dose 09/21/2014;  (a) final echocardiogram 09/07/2014 showed an ejection fraction of 55-60%  (6) anastrozole started May 2015; completed April 2020  (a) bone density 12/15/2013 normal  (b) bone density 02/01/2016 at [facility] was normal with a T score of -1.0   OTHER PROBLEMS: (a) History of chronic lymphoid  leukemia diagnosed by flow cytometry 06/29/2013, the cells being  CD5, CD20 and CD23 positive, CD10 negative.   (1) right renal mass resected on 02/05/15, consisting of atypical lymphoid proliferation composed of monotonous small lymphocytes   (2) Anemia with a normal MCV and normal ferritin-- B-12 and folate normal; stable  (3)  ibrutinib 280 milligrams daily started 08/01/202020, discontinued 06/03/2019 because of concerns that the patient was confused and not taking her medication appropriately  (4) rituximab weekly x8 started 06/15/2019    PLAN: Sharise will start her rituximab today all she will receive this test dose, 100 mg.  I am hopeful she will be able to tolerate it with no side effects.  If so she will receive a full dose next week.  The plan is to do this for 8 weeks in a row and then broaden the follow-up treatments as tolerated.  She is aware of the possible toxicities side effects and complications of this medication and she knows to call us for any problems that may develop before her return visit here  She is taking appropriate pandemic precautions.  Virgie Dad. Magrinat, MD  06/22/19 2:36 PM Medical Oncology and Hematology [facility] [place], [place] [address] Tel. [phone]    Fax. [phone]   I, Wilburn Mylar, am acting as scribe for Dr. Virgie Dad. Magrinat.  I, Lurline Del MD, have reviewed the above documentation for accuracy and completeness, and I agree with the above.

## 2019-06-22 ENCOUNTER — Inpatient Hospital Stay (HOSPITAL_BASED_OUTPATIENT_CLINIC_OR_DEPARTMENT_OTHER): Payer: Medicare Other | Admitting: Oncology

## 2019-06-22 ENCOUNTER — Inpatient Hospital Stay: Payer: Medicare Other

## 2019-06-22 ENCOUNTER — Other Ambulatory Visit: Payer: Self-pay

## 2019-06-22 VITALS — BP 109/64 | HR 92 | Temp 99.1°F | Resp 16

## 2019-06-22 VITALS — BP 128/73 | HR 101 | Temp 98.0°F | Resp 18 | Ht 64.0 in | Wt 187.3 lb

## 2019-06-22 DIAGNOSIS — C9110 Chronic lymphocytic leukemia of B-cell type not having achieved remission: Secondary | ICD-10-CM

## 2019-06-22 DIAGNOSIS — R5383 Other fatigue: Secondary | ICD-10-CM | POA: Diagnosis not present

## 2019-06-22 DIAGNOSIS — Z9221 Personal history of antineoplastic chemotherapy: Secondary | ICD-10-CM | POA: Diagnosis not present

## 2019-06-22 DIAGNOSIS — Z5112 Encounter for antineoplastic immunotherapy: Secondary | ICD-10-CM | POA: Diagnosis not present

## 2019-06-22 DIAGNOSIS — Z170 Estrogen receptor positive status [ER+]: Secondary | ICD-10-CM

## 2019-06-22 DIAGNOSIS — E785 Hyperlipidemia, unspecified: Secondary | ICD-10-CM | POA: Diagnosis not present

## 2019-06-22 DIAGNOSIS — Z85828 Personal history of other malignant neoplasm of skin: Secondary | ICD-10-CM | POA: Diagnosis not present

## 2019-06-22 DIAGNOSIS — E039 Hypothyroidism, unspecified: Secondary | ICD-10-CM | POA: Diagnosis not present

## 2019-06-22 DIAGNOSIS — Z79899 Other long term (current) drug therapy: Secondary | ICD-10-CM | POA: Diagnosis not present

## 2019-06-22 DIAGNOSIS — Z923 Personal history of irradiation: Secondary | ICD-10-CM | POA: Diagnosis not present

## 2019-06-22 DIAGNOSIS — K219 Gastro-esophageal reflux disease without esophagitis: Secondary | ICD-10-CM | POA: Diagnosis not present

## 2019-06-22 DIAGNOSIS — Z888 Allergy status to other drugs, medicaments and biological substances status: Secondary | ICD-10-CM | POA: Diagnosis not present

## 2019-06-22 DIAGNOSIS — F419 Anxiety disorder, unspecified: Secondary | ICD-10-CM | POA: Diagnosis not present

## 2019-06-22 DIAGNOSIS — C50511 Malignant neoplasm of lower-outer quadrant of right female breast: Secondary | ICD-10-CM

## 2019-06-22 DIAGNOSIS — F329 Major depressive disorder, single episode, unspecified: Secondary | ICD-10-CM | POA: Diagnosis not present

## 2019-06-22 DIAGNOSIS — R232 Flushing: Secondary | ICD-10-CM | POA: Diagnosis not present

## 2019-06-22 LAB — LACTATE DEHYDROGENASE: LDH: 217 U/L — ABNORMAL HIGH (ref 98–192)

## 2019-06-22 LAB — CBC WITH DIFFERENTIAL/PLATELET
Abs Immature Granulocytes: 0.04 10*3/uL (ref 0.00–0.07)
Basophils Absolute: 0.1 10*3/uL (ref 0.0–0.1)
Basophils Relative: 1 %
Eosinophils Absolute: 0.4 10*3/uL (ref 0.0–0.5)
Eosinophils Relative: 2 %
HCT: 38.7 % (ref 36.0–46.0)
Hemoglobin: 12.3 g/dL (ref 12.0–15.0)
Immature Granulocytes: 0 %
Lymphocytes Relative: 63 %
Lymphs Abs: 12.3 10*3/uL — ABNORMAL HIGH (ref 0.7–4.0)
MCH: 27.9 pg (ref 26.0–34.0)
MCHC: 31.8 g/dL (ref 30.0–36.0)
MCV: 87.8 fL (ref 80.0–100.0)
Monocytes Absolute: 1 10*3/uL (ref 0.1–1.0)
Monocytes Relative: 5 %
Neutro Abs: 5.5 10*3/uL (ref 1.7–7.7)
Neutrophils Relative %: 29 %
Platelets: 235 10*3/uL (ref 150–400)
RBC: 4.41 MIL/uL (ref 3.87–5.11)
RDW: 14.3 % (ref 11.5–15.5)
WBC: 19.4 10*3/uL — ABNORMAL HIGH (ref 4.0–10.5)
nRBC: 0 % (ref 0.0–0.2)

## 2019-06-22 MED ORDER — ACETAMINOPHEN 325 MG PO TABS
650.0000 mg | ORAL_TABLET | Freq: Once | ORAL | Status: AC
  Administered 2019-06-22: 650 mg via ORAL

## 2019-06-22 MED ORDER — DEXAMETHASONE SODIUM PHOSPHATE 10 MG/ML IJ SOLN
INTRAMUSCULAR | Status: AC
  Filled 2019-06-22: qty 1

## 2019-06-22 MED ORDER — SODIUM CHLORIDE 0.9 % IV SOLN: 10.0000 mg | Freq: Once | INTRAVENOUS | Status: DC

## 2019-06-22 MED ORDER — SODIUM CHLORIDE 0.9 % IV SOLN
100.0000 mg | Freq: Once | INTRAVENOUS | Status: AC
  Administered 2019-06-22: 100 mg via INTRAVENOUS
  Filled 2019-06-22: qty 10

## 2019-06-22 MED ORDER — DIPHENHYDRAMINE HCL 25 MG PO CAPS
25.0000 mg | ORAL_CAPSULE | Freq: Once | ORAL | Status: AC
  Administered 2019-06-22: 25 mg via ORAL

## 2019-06-22 MED ORDER — DIPHENHYDRAMINE HCL 25 MG PO CAPS
ORAL_CAPSULE | ORAL | Status: AC
  Filled 2019-06-22: qty 1

## 2019-06-22 MED ORDER — DEXAMETHASONE SODIUM PHOSPHATE 10 MG/ML IJ SOLN
10.0000 mg | Freq: Once | INTRAMUSCULAR | Status: AC
  Administered 2019-06-22: 10 mg via INTRAVENOUS

## 2019-06-22 MED ORDER — ACETAMINOPHEN 325 MG PO TABS
ORAL_TABLET | ORAL | Status: AC
  Filled 2019-06-22: qty 2

## 2019-06-22 MED ORDER — SODIUM CHLORIDE 0.9 % IV SOLN
Freq: Once | INTRAVENOUS | Status: AC
  Administered 2019-06-22: 12:00:00 via INTRAVENOUS
  Filled 2019-06-22: qty 250

## 2019-06-22 NOTE — Patient Instructions (Signed)
[facility] Discharge Instructions for Patients Receiving Chemotherapy  Today you received the following chemotherapy agents :  Rituximab-pvvr.  To help prevent nausea and vomiting after your treatment, we encourage you to take your nausea medication as prescribed.   If you develop nausea and vomiting that is not controlled by your nausea medication, call the clinic.   BELOW ARE SYMPTOMS THAT SHOULD BE REPORTED IMMEDIATELY:  *FEVER GREATER THAN 100.5 F  *CHILLS WITH OR WITHOUT FEVER  NAUSEA AND VOMITING THAT IS NOT CONTROLLED WITH YOUR NAUSEA MEDICATION  *UNUSUAL SHORTNESS OF BREATH  *UNUSUAL BRUISING OR BLEEDING  TENDERNESS IN MOUTH AND THROAT WITH OR WITHOUT PRESENCE OF ULCERS  *URINARY PROBLEMS  *BOWEL PROBLEMS  UNUSUAL RASH Items with * indicate a potential emergency and should be followed up as soon as possible.  Feel free to call the clinic should you have any questions or concerns. The clinic phone number is [phone].  Please show the CHEMO ALERT CARD at check-in to the Emergency Department and triage nurse.   

## 2019-06-23 ENCOUNTER — Telehealth: Payer: Self-pay | Admitting: *Deleted

## 2019-06-29 ENCOUNTER — Encounter (HOSPITAL_COMMUNITY): Payer: Self-pay

## 2019-06-29 ENCOUNTER — Inpatient Hospital Stay: Payer: Medicare Other

## 2019-06-29 ENCOUNTER — Other Ambulatory Visit: Payer: Self-pay

## 2019-06-29 ENCOUNTER — Emergency Department (HOSPITAL_COMMUNITY)
Admission: EM | Admit: 2019-06-29 | Discharge: 2019-06-29 | Disposition: A | Discharge status: Home / Self Care | Payer: Medicare Other | Attending: Emergency Medicine | Admitting: Emergency Medicine

## 2019-06-29 ENCOUNTER — Emergency Department (HOSPITAL_COMMUNITY): Payer: Medicare Other

## 2019-06-29 ENCOUNTER — Inpatient Hospital Stay: Payer: Medicare Other | Admitting: Adult Health

## 2019-06-29 ENCOUNTER — Inpatient Hospital Stay: Payer: Medicare Other | Attending: Oncology | Admitting: Medical

## 2019-06-29 DIAGNOSIS — K219 Gastro-esophageal reflux disease without esophagitis: Secondary | ICD-10-CM | POA: Insufficient documentation

## 2019-06-29 DIAGNOSIS — R0602 Shortness of breath: Secondary | ICD-10-CM

## 2019-06-29 DIAGNOSIS — Z79899 Other long term (current) drug therapy: Secondary | ICD-10-CM | POA: Insufficient documentation

## 2019-06-29 DIAGNOSIS — Z170 Estrogen receptor positive status [ER+]: Secondary | ICD-10-CM | POA: Insufficient documentation

## 2019-06-29 DIAGNOSIS — F329 Major depressive disorder, single episode, unspecified: Secondary | ICD-10-CM | POA: Insufficient documentation

## 2019-06-29 DIAGNOSIS — R000 Tachycardia, unspecified: Secondary | ICD-10-CM | POA: Diagnosis not present

## 2019-06-29 DIAGNOSIS — C50511 Malignant neoplasm of lower-outer quadrant of right female breast: Secondary | ICD-10-CM | POA: Insufficient documentation

## 2019-06-29 DIAGNOSIS — R079 Chest pain, unspecified: Secondary | ICD-10-CM | POA: Insufficient documentation

## 2019-06-29 DIAGNOSIS — D649 Anemia, unspecified: Secondary | ICD-10-CM | POA: Insufficient documentation

## 2019-06-29 DIAGNOSIS — R5383 Other fatigue: Secondary | ICD-10-CM | POA: Insufficient documentation

## 2019-06-29 DIAGNOSIS — Z923 Personal history of irradiation: Secondary | ICD-10-CM | POA: Insufficient documentation

## 2019-06-29 DIAGNOSIS — I10 Essential (primary) hypertension: Secondary | ICD-10-CM | POA: Insufficient documentation

## 2019-06-29 DIAGNOSIS — F419 Anxiety disorder, unspecified: Secondary | ICD-10-CM | POA: Insufficient documentation

## 2019-06-29 DIAGNOSIS — R42 Dizziness and giddiness: Secondary | ICD-10-CM | POA: Insufficient documentation

## 2019-06-29 DIAGNOSIS — Z85828 Personal history of other malignant neoplasm of skin: Secondary | ICD-10-CM | POA: Insufficient documentation

## 2019-06-29 DIAGNOSIS — E039 Hypothyroidism, unspecified: Secondary | ICD-10-CM | POA: Insufficient documentation

## 2019-06-29 DIAGNOSIS — Z9221 Personal history of antineoplastic chemotherapy: Secondary | ICD-10-CM | POA: Insufficient documentation

## 2019-06-29 DIAGNOSIS — R531 Weakness: Secondary | ICD-10-CM

## 2019-06-29 DIAGNOSIS — R05 Cough: Secondary | ICD-10-CM | POA: Insufficient documentation

## 2019-06-29 DIAGNOSIS — E785 Hyperlipidemia, unspecified: Secondary | ICD-10-CM | POA: Insufficient documentation

## 2019-06-29 DIAGNOSIS — Z8249 Family history of ischemic heart disease and other diseases of the circulatory system: Secondary | ICD-10-CM | POA: Insufficient documentation

## 2019-06-29 DIAGNOSIS — C9110 Chronic lymphocytic leukemia of B-cell type not having achieved remission: Secondary | ICD-10-CM | POA: Insufficient documentation

## 2019-06-29 DIAGNOSIS — Z5112 Encounter for antineoplastic immunotherapy: Secondary | ICD-10-CM | POA: Insufficient documentation

## 2019-06-29 DIAGNOSIS — R0789 Other chest pain: Secondary | ICD-10-CM | POA: Diagnosis not present

## 2019-06-29 LAB — BASIC METABOLIC PANEL
Anion gap: 10 (ref 5–15)
BUN: 18 mg/dL (ref 8–23)
CO2: 20 mmol/L — ABNORMAL LOW (ref 22–32)
Calcium: 9.3 mg/dL (ref 8.9–10.3)
Chloride: 107 mmol/L (ref 98–111)
Creatinine, Ser: 1.2 mg/dL — ABNORMAL HIGH (ref 0.44–1.00)
GFR calc Af Amer: 52 mL/min — ABNORMAL LOW (ref >60)
GFR calc non Af Amer: 45 mL/min — ABNORMAL LOW (ref >60)
Glucose, Bld: 97 mg/dL (ref 70–99)
Potassium: 4.4 mmol/L (ref 3.5–5.1)
Sodium: 137 mmol/L (ref 135–145)

## 2019-06-29 LAB — CBC
HCT: 42.5 % (ref 36.0–46.0)
Hemoglobin: 13.1 g/dL (ref 12.0–15.0)
MCH: 27.9 pg (ref 26.0–34.0)
MCHC: 30.8 g/dL (ref 30.0–36.0)
MCV: 90.6 fL (ref 80.0–100.0)
Platelets: 262 10*3/uL (ref 150–400)
RBC: 4.69 MIL/uL (ref 3.87–5.11)
RDW: 14.4 % (ref 11.5–15.5)
WBC: 12 10*3/uL — ABNORMAL HIGH (ref 4.0–10.5)
nRBC: 0 % (ref 0.0–0.2)

## 2019-06-29 LAB — TROPONIN I (HIGH SENSITIVITY)
Troponin I (High Sensitivity): <2 ng/L (ref <18)
Troponin I (High Sensitivity): <2 ng/L (ref <18)

## 2019-06-29 MED ORDER — IOHEXOL 350 MG/ML SOLN
100.0000 mL | Freq: Once | INTRAVENOUS | Status: AC | PRN
  Administered 2019-06-29: 100 mL via INTRAVENOUS

## 2019-06-29 MED ORDER — SODIUM CHLORIDE 0.9% FLUSH: 3.0000 mL | Freq: Once | INTRAVENOUS | Status: DC

## 2019-06-29 MED ORDER — SODIUM CHLORIDE (PF) 0.9 % IJ SOLN
INTRAMUSCULAR | Status: AC
  Filled 2019-06-29: qty 50

## 2019-06-29 NOTE — Discharge Instructions (Signed)
Please follow-up with your oncologist your primary care doctor about your visit today.  We do not see any signs of blood clots in your lungs on her CT scan.  Your blood tests and your EKG did not show signs of heart attack.  It is possible your pain is due to reflux or muscular pain, or even to back pain.  It is also possible this is a side effect of the chemotherapy that you are on.  However, it is important that you follow-up with your physicians for this visit to the ER.

## 2019-06-29 NOTE — ED Notes (Signed)
Patient transported to CT 

## 2019-06-29 NOTE — Progress Notes (Signed)
[name] [name] was seen in the front of the clinic today for secondary COVID-19 screening when she presented reporting that she was having shortness of breath, chest pain, and weakness.  When asked when the started she stated that they began last Friday.  She thought that she had experienced a myocardial infarction.  She reports that she had acute measured 6-7/10.  She reports that this was accompanied with shortness of breath.  She denied diaphoresis or radiation.  She did not seek medical attention as she reports that she was frightened.  She presents today for treatment of rituximab.  This case was discussed with Dr. Jana Hakim who agrees that the patient should be taken to the emergency room for evaluation.  Her infusion will be held today.  She has infusion scheduled for 07/06/2019 and is scheduled to be seen in follow-up on 07/13/2019.  Sandi Mealy, MHS, PA-C Physician Assistant

## 2019-06-29 NOTE — ED Notes (Signed)
Two unsuccessful IV attempts by staff RNs.  Will ask other RNs to attempt US guided IV.

## 2019-06-29 NOTE — ED Provider Notes (Signed)
[facility] DEPT Provider Note   CSN: [id number] Arrival date & time: 06/29/19  1057     History   Chief Complaint Chief Complaint  Patient presents with  . Chest Pain    HPI [name] [name] is a 73 y.o. female with a history of breast cancer, CLL, blood clot in the left upper extremity, presented to the emergency department chest pain.  Patient reports acute onset of epigastric chest pain and pressure beginning 5 days ago while at home.  She says the pain began while she was at rest.  There is only present with exertion.  She states that even minimal exertion such as trying to walk across her house leads to extreme fatigue and dyspnea.   While she is at rest she has minimal or no symptoms.  Today she went to the cancer center for her scheduled chemo infusion, and was complaining to them of chest pain and shortness of breath, was told she needs to come to the emergency department.  She was told recently she has a history of atrial fibrillation, but is unsure when she goes in and out of this, or whether it is permanent.  She states she is not on any blood thinners.  She says she was diagnosed with a blood clot in her left arm which was "superficial" approximately 1 to 2 years ago.  She is not on blood thinners for this.  She denies history of MI or cardiac stents.  On 9/23 she had a CT coronary scan which showed: IMPRESSION: 1. Coronary calcium score of 12. This was 57 percentile for age and sex matched control.  2. Normal coronary origin with right dominance.  3. No evidence of flow limiting CAD. Small areas of proximal LAD calcification with no surrounding stenosis. Continue with risk factor modification.     HPI  Past Medical History:  Diagnosis Date  . Alopecia   . Anxiety   . Arthritis    "spine" (07/28/2013)  . Breast cancer ([place]) 1997; 2014   right  . CAP (community acquired pneumonia)    admission 06-04-2013, failed outpatient  .  Chronic back pain    "lower back and upper neck" (07/28/2013)  . CLL (chronic lymphocytic leukemia) ([place]) 05/2013  . DDD (degenerative disc disease)   . 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 ear   . Depression   . Diverticulosis   . Elevated LFTs   . Fibromyalgia 1978  . GERD (gastroesophageal reflux disease)   . Hematuria   . History of syncope    episode 2008--  no recurrence since  . HLD (hyperlipidemia)   . Hypothyroidism   . Kidney stones 2014  . Plantar fasciitis   . Ruptured disk    one in neck and two in back  . S/P chemotherapy, time since greater than 12 weeks   . Sinus tachycardia    mild resting  . Skin cancer   . Stool incontinence    "at times recently"     Patient Active Problem List   Diagnosis Date Noted  . Superficial thrombophlebitis 08/23/2018  . Iron deficiency anemia 10/01/2017  . 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  . Status post lumbar spine operative procedure for decompression of spinal cord 05/26/2016  . Depression 05/05/2016  . Degenerative spondylolisthesis 04/25/2016  . Spasm of muscle, back 04/25/2016  . Genetic testing 11/30/2015  . Essential hypertension 10/18/2015  . Morbid obesity ([place]) 10/18/2015  . Upper airway cough syndrome 10/17/2015  . Renal mass  02/05/2015  . Anemia 10/25/2013  . GERD (gastroesophageal reflux disease) 09/27/2013  . CLL (chronic lymphocytic leukemia) ([facility]) 07/27/2013  . Malignant neoplasm of lower-outer quadrant of right breast of female, estrogen receptor positive ([place]) 07/26/2013  . Nephrolithiasis 06/16/2013  . 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 Chronic pain syndrome 05/10/2013  . DDD (degenerative disc disease) 05/10/2013  . SOB (shortness of breath)   . Sinus tachycardia   . HLD (hyperlipidemia)   . Orthostatic hypotension   . Anxiety   . Arthritis   . Diverticulosis   . Fibromyalgia   . Hypothyroidism     Past Surgical History:  Procedure Laterality Date  . ANTERIOR LATERAL LUMBAR FUSION 4 LEVELS  Left 04/25/2016   Procedure: LEFT LUMBAR ONE-TWO, LUMBAR TWO-THREE, LUMBAR THREE-FOUR, LUMBAR FOUR-FIVE ANTERIOR LATERAL LUMBAR FUSION;  Surgeon: Earnie Larsson, MD;  Location: [facility];  Service: Neurosurgery;  Laterality: Left;  . APPENDECTOMY  1997  . APPLICATION OF ROBOTIC ASSISTANCE FOR SPINAL PROCEDURE N/A 04/06/2017   Procedure: APPLICATION OF ROBOTIC ASSISTANCE FOR SPINAL PROCEDURE;  Surgeon: Earnie Larsson, MD;  Location: [facility];  Service: Neurosurgery;  Laterality: N/A;  . BREAST BIOPSY Right 2014  . BREAST LUMPECTOMY Right 1997  . BREAST LUMPECTOMY WITH AXILLARY LYMPH NODE DISSECTION Right 1997  . CATARACT EXTRACTION, BILATERAL  2019   Dr. Kathlen Mody  . CYSTOSCOPY WITH RETROGRADE PYELOGRAM, URETEROSCOPY AND STENT PLACEMENT Bilateral 06/17/2013   Procedure: CYSTOSCOPY WITH RETROGRADE PYELOGRAM, URETEROSCOPY AND LEFT DOUBLE  J STENT PLACEMENT RIGHT URETERAL HOLMIIUM LASER AND DOUBLE J STENT ;  Surgeon: Hanley Ben, MD;  Location: [facility];  Service: Urology;  Laterality: Bilateral;  . HOLMIUM LASER APPLICATION Bilateral [id number]   Procedure: HOLMIUM LASER APPLICATION;  Surgeon: Hanley Ben, MD;  Location: [facility];  Service: Urology;  Laterality: Bilateral;  . LITHOTRIPSY Left 06/2013  . LUMBAR EPIDURAL INJECTION     has had 7 injections  . PORT-A-CATH REMOVAL Left 10/03/2014   Procedure: REMOVAL PORT-A-CATH;  Surgeon: Donnie Mesa, MD;  Location: [facility];  Service: General;  Laterality: Left;  . PORTACATH PLACEMENT Left 07/28/2013   Procedure: ATTEMPTED INSERTION PORT-A-CATH;  Surgeon: Imogene Burn. Georgette Dover, MD;  Location: [facility];  Service: General;  Laterality: Left;  . POSTERIOR LUMBAR FUSION 4 LEVEL N/A 04/25/2016   Procedure: LUMBAR FIVE-SACRAL ONE POSTERIOR LUMBAR INTERBODY FUSION, THORACIC NINE-SACRAL ONE POSTERIOR LATERAL ARTHRODESIS WITH PEDICLE SCREWS;  Surgeon: Earnie Larsson, MD;  Location: [facility];  Service: Neurosurgery;   Laterality: N/A;  . RETINAL DETACHMENT SURGERY Right 2013  . ROBOTIC ASSITED PARTIAL NEPHRECTOMY Right 02/05/2015   Procedure: ROBOTIC ASSITED PARTIAL NEPHRECTOMY;  Surgeon: Raynelle Bring, MD;  Location: [facility];  Service: Urology;  Laterality: Right;  . SIMPLE MASTECTOMY WITH AXILLARY SENTINEL NODE BIOPSY Right 07/28/2013   Procedure: RIGHT TOTAL  MASTECTOMY;  Surgeon: Imogene Burn. Georgette Dover, MD;  Location: [facility];  Service: General;  Laterality: Right;  . TONSILLECTOMY  AGE 53  . TOTAL ABDOMINAL HYSTERECTOMY W/ BILATERAL SALPINGOOPHORECTOMY  1997     OB History    Gravida  1   Para  1   Term      Preterm      AB      Living  1     SAB      TAB      Ectopic      Multiple      Live Births  Home Medications    Prior to Admission medications   Medication Sig Start Date End Date Taking? Authorizing Provider  allopurinol (ZYLOPRIM) 300 MG tablet Take 1 tablet (300 mg total) by mouth daily. 03/25/19   Magrinat, Virgie Dad, MD  amitriptyline (ELAVIL) 150 MG tablet Take 150 mg by mouth at bedtime.    [provider]  DULoxetine (CYMBALTA) 30 MG capsule Take 2 capsules (60 mg total) by mouth daily after breakfast. 06/03/19   Magrinat, Virgie Dad, MD  levothyroxine (SYNTHROID, LEVOTHROID) 88 MCG tablet Take 88 mcg by mouth daily before breakfast.  06/30/17   [provider]  metoprolol tartrate (LOPRESSOR) 100 MG tablet Take 1 tablet (100 mg) by mouth once 2 hours prior to procedure 04/25/19   Jerline Pain, MD  omeprazole (PRILOSEC) 40 MG capsule TAKE 1 CAPSULE(40 MG) BY MOUTH DAILY AFTER BREAKFAST 05/11/19   Magrinat, Virgie Dad, MD  REXULTI 2 MG TABS Take 2 mg by mouth at bedtime.  06/11/17   [provider]  rosuvastatin (CRESTOR) 5 MG tablet Take 1 tablet (5 mg total) by mouth daily. 05/19/19 08/17/19  Jerline Pain, MD    Family History Family History  Problem Relation Age of Onset  . Heart disease Maternal Grandfather   . Diabetes Maternal  Grandfather   . Colon cancer Maternal Aunt 79  . Brain cancer Paternal Grandmother        dx in 60s  . Dementia Mother   . Diabetes Mother   . Osteoporosis Mother   . Diabetes Maternal Aunt   . Prostate cancer Father 17  . Bipolar disorder Maternal Aunt   . Stroke Maternal Aunt   . Stomach cancer Paternal Uncle        dx in late 3s    Social History Social History   Tobacco Use  . Smoking status: Never Smoker  . Smokeless tobacco: Never Used  Substance Use Topics  . Alcohol use: No    Comment: 07/28/2013 "no alcohol since 1994; never had problem w/it"  . Drug use: No     Allergies   Lasix [furosemide], Lithium, Sulfa antibiotics, Trazodone and nefazodone, and Lyrica [pregabalin]   Review of Systems Review of Systems  Constitutional: Negative for chills and fever.  Respiratory: Positive for chest tightness and shortness of breath. Negative for cough.   Cardiovascular: Positive for chest pain. Negative for palpitations and leg swelling.  Gastrointestinal: Negative for abdominal pain, nausea and vomiting.  Musculoskeletal: Negative for arthralgias and back pain.  Neurological: Positive for light-headedness. Negative for syncope and headaches.  All other systems reviewed and are negative.    Physical Exam Updated Vital Signs BP (!) 112/53   Pulse 95   Temp 98.7 F (37.1 C) (Oral)   Resp 16   Ht 5\' 5"  (1.651 m)   Wt 88.9 kg   LMP 08/26/1995   SpO2 100%   BMI 32.62 kg/m   Physical Exam Vitals signs and nursing note reviewed.  Constitutional:      General: She is not in acute distress.    Appearance: She is well-developed.  HENT:     Head: Normocephalic and atraumatic.  Eyes:     Conjunctiva/sclera: Conjunctivae normal.  Neck:     Musculoskeletal: Neck supple.  Cardiovascular:     Rate and Rhythm: Regular rhythm. Tachycardia present.     Heart sounds: No murmur.     Comments: HR 95-105 and regular Pulmonary:     Effort: Pulmonary effort is normal. No  respiratory distress.     Breath sounds: Normal breath sounds.  Abdominal:     Palpations: Abdomen is soft.     Tenderness: There is no abdominal tenderness.  Skin:    General: Skin is warm and dry.  Neurological:     Mental Status: She is alert.      ED Treatments / Results  Labs (all labs ordered are listed, but only abnormal results are displayed) Labs Reviewed  BASIC METABOLIC PANEL - Abnormal; Notable for the following components:      Result Value   CO2 20 (*)    Creatinine, Ser 1.20 (*)    GFR calc non Af Amer 45 (*)    GFR calc Af Amer 52 (*)    All other components within normal limits  CBC - Abnormal; Notable for the following components:   WBC 12.0 (*)    All other components within normal limits  TROPONIN I (HIGH SENSITIVITY)  TROPONIN I (HIGH SENSITIVITY)    EKG None  Radiology Dg Chest 2 View  Result Date: 06/29/2019 CLINICAL DATA:  73 year old female with chest pain and shortness of breath. EXAM: CHEST - 2 VIEW COMPARISON:  Chest radiograph dated 06/03/2019 FINDINGS: The lungs are clear. There is no pleural effusion or pneumothorax. The cardiac silhouette is within normal limits. No acute osseous pathology. Partially visualized thoracolumbar spinal fixation hardware. Multiple surgical clips noted over the right lateral chest wall similar to prior radiograph. No acute osseous pathology. IMPRESSION: No active cardiopulmonary disease. Electronically Signed   By: Anner Crete M.D.   On: 06/29/2019 12:26   Ct Angio Chest Pe W And/or Wo Contrast  Result Date: 06/29/2019 CLINICAL DATA:  Chest pain and shortness of breath. EXAM: CT ANGIOGRAPHY CHEST WITH CONTRAST TECHNIQUE: Multidetector CT imaging of the chest was performed using the standard protocol during bolus administration of intravenous contrast. Multiplanar CT image reconstructions and MIPs were obtained to evaluate the vascular anatomy. CONTRAST:  11mL OMNIPAQUE IOHEXOL 350 MG/ML SOLN COMPARISON:   Cardiac CTA 05/18/2019. Thoracic spine CT 05/11/2018. Chest CT 07/29/2013. FINDINGS: Cardiovascular: Pulmonary arterial opacification is adequate to the segmental level without emboli identified although motion artifact limits assessment in the lower lobes. There is no evidence of thoracic aortic dissection or aneurysm. The heart is normal in size. There is no pericardial effusion. Mediastinum/Nodes: Status post right mastectomy and right axillary dissection. No enlarged axillary, mediastinal, or hilar lymph nodes. Small sliding hiatal hernia. Unremarkable thyroid. Lungs/Pleura: No pleural effusion or pneumothorax. Focal atelectasis or scarring laterally in the lingula, unchanged from 05/18/2019. Mild bronchiectasis in the right upper lobe. No mass. Upper Abdomen: Mildly prominent periportal lymph nodes measuring up to approximately 1.2 cm in short axis, similar to the 05/11/2018 CT. Musculoskeletal: No suspicious osseous lesion. Partially visualized posterior thoracolumbar fusion. Review of the MIP images confirms the above findings. IMPRESSION: No evidence of pulmonary emboli or other acute abnormality in the chest within mild limitations of respiratory motion artifact. Electronically Signed   By: Logan Bores M.D.   On: 06/29/2019 15:18    Procedures Procedures (including critical care time)  Medications Ordered in ED Medications  sodium chloride (PF) 0.9 % injection (has no administration in time range)  iohexol (OMNIPAQUE) 350 MG/ML injection 100 mL (100 mLs Intravenous Contrast Given 06/29/19 1443)     Initial Impression / Assessment and Plan / ED Course  I have reviewed the triage vital signs and the nursing notes.  Pertinent labs & imaging results that were available during my  care of the patient were reviewed by me and considered in my medical decision making (see chart for details).  73 year old female presenting to the emergency department with acute onset of chest pain beginning 5 days  ago while at rest.  She is reporting extreme dyspnea on exertion.  She was referred into the ED from her chemo clinic today for evaluation for cardiac or pulmonary disease.  Here in the ED she is asymptomatic and not currently having any symptoms while at rest on the bed.  Her heart rate is close to 100 bpm regular.  She does not appear to be in A. fib on her EKG.  I see no evidence of STEMI on her EKG.  Plan to check troponins, CBC and BMP.  Anticipate she will need a CT PE scan to rule out PE.  For now she is hemodynamically stable.    Final Clinical Impressions(s) / ED Diagnoses   Final diagnoses:  Shortness of breath  Chest pain, unspecified type    ED Discharge Orders    None       Wyvonnia Dusky, MD 06/29/19 [phone]

## 2019-06-29 NOTE — Progress Notes (Deleted)
ID: [name] [name] OB: Jul 10, 1946  MR#: [id number]  CSN#:[id number]  Patient Care Team: Deland Pretty, MD as PCP - General (Internal Medicine) Jerline Pain, MD as PCP - Cardiology (Cardiology) Magrinat, Virgie Dad, MD as Consulting Physician (Oncology) Milus Banister, MD as Attending Physician (Gastroenterology) Megan Salon, MD as Consulting Physician (Gynecology) Donnie Mesa, MD as Consulting Physician (General Surgery) Noemi Chapel, NP as Nurse Practitioner Rod Can, MD as Consulting Physician (Orthopedic Surgery)   CHIEF COMPLAINT: Estrogen receptor positive Right Breast Cancer; chronic lymphoid leukemia  CURRENT TREATMENT: rituximab   INTERVAL HISTORY: [name] [name] returns today for follow-up and treatment of her chronic lymphoid leukemia and estrogen receptor positive breast cancer.   She continues under observation as far as her breast cancer is concerned.  Her most recent mammogram was 04/04/2019.  It showed the breast density category B.  There was no evidence of malignancy.  She began Rituximab last week with a test dose of 147m.    REVIEW OF SYSTEMS: JCarmina   BREAST CANCER HISTORY: From the prior summary:  [name] [name] a history of breast cancer dating back to 1997, [name] [name] Dr. TAnnabell Sabalperformed a right lumpectomy and axillary lymph node dissection for what according to the patient and her family was a stage I invasive ductal breast cancer. She received radiation adjuvantly and then took tamoxifen for 5 years  In February of 2014 she had a normal screening mammography, but in November 2014 she felt a "hard lump" in her right breast. She brought this to the attention of her urologist, Dr. NIzora Gala and he set [facility] This showed her breast density to be category B. There was no mammographic abnormality noted but ultrasonography showed a 1.4 cm irregularly shaped solid mass in the right breast at  the 8:00 position. This was biopsied the same day, and showed (SAA [id number] an invasive ductal carcinoma, grade 2, estrogen receptor 100% positive, progesterone receptor 13% positive, with an MIB-1 of 33% and HER-2 amplification with a HER-2: CEP 17 ratio of 3.19, and a HER-2 copy number percent all of 4.15.  The patient's subsequent history is as detailed below     PAST MEDICAL HISTORY: Past Medical History:  Diagnosis Date  . Alopecia   . Anxiety   . Arthritis    "spine" (07/28/2013)  . Breast cancer [place] [place]; 2014   right  . CAP (community acquired pneumonia)    admission 06-04-2013, failed outpatient  . Chronic back pain    "lower back and upper neck" (07/28/2013)  . CLL [place]ocytic [place]) [place] [place]  . DDD (degenerative disc disease)   . 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 ear   . Depression   . Diverticulosis   . Elevated LFTs   . Fibromyalgia 1978  . GERD (gastroesophageal reflux disease)   . Hematuria   . History of syncope    episode 2008--  no recurrence since  . HLD (hyperlipidemia)   . Hypothyroidism   . Kidney stones 2014  . Plantar fasciitis   . Ruptured disk    one in neck and two in back  . S/P chemotherapy, time since greater than 12 weeks   . Sinus tachycardia    mild resting  . Skin cancer   . Stool incontinence    "at times recently"     PAST SURGICAL HISTORY: Past Surgical History:  Procedure Laterality Date  . ANTERIOR LATERAL LUMBAR FUSION 4 LEVELS Left 04/25/2016  Procedure: LEFT LUMBAR ONE-TWO, LUMBAR TWO-THREE, LUMBAR THREE-FOUR, LUMBAR FOUR-FIVE ANTERIOR LATERAL LUMBAR FUSION;  Surgeon: Earnie Larsson, MD;  Location: [place] [facility];  Service: Neurosurgery;  Laterality: Left;  . APPENDECTOMY  1997  . APPLICATION OF ROBOTIC ASSISTANCE FOR SPINAL PROCEDURE N/A 04/06/2017   Procedure: APPLICATION OF ROBOTIC ASSISTANCE FOR SPINAL PROCEDURE;  Surgeon: Earnie Larsson, MD;  Location: [place];  Service: Neurosurgery;  Laterality: N/A;  . BREAST BIOPSY Right 2014   . BREAST LUMPECTOMY Right 1997  . BREAST LUMPECTOMY WITH AXILLARY LYMPH NODE DISSECTION Right 1997  . CATARACT EXTRACTION, BILATERAL  2019   Dr. Kathlen Mody  . CYSTOSCOPY WITH RETROGRADE PYELOGRAM, URETEROSCOPY AND STENT PLACEMENT Bilateral 06/17/2013   Procedure: CYSTOSCOPY WITH RETROGRADE PYELOGRAM, URETEROSCOPY AND LEFT DOUBLE  J STENT PLACEMENT RIGHT URETERAL HOLMIIUM LASER AND DOUBLE J STENT ;  Surgeon: Hanley Ben, MD;  Location: [place];  Service: Urology;  Laterality: Bilateral;  . HOLMIUM LASER APPLICATION Bilateral 96/28/3662   Procedure: HOLMIUM LASER APPLICATION;  Surgeon: Hanley Ben, MD;  Location: [place];  Service: Urology;  Laterality: Bilateral;  . LITHOTRIPSY Left 06/2013  . LUMBAR EPIDURAL INJECTION     has had 7 injections  . PORT-A-CATH REMOVAL Left 10/03/2014   Procedure: REMOVAL PORT-A-CATH;  Surgeon: Donnie Mesa, MD;  Location: [place];  Service: General;  Laterality: Left;  . PORTACATH PLACEMENT Left 07/28/2013   Procedure: ATTEMPTED INSERTION PORT-A-CATH;  Surgeon: Imogene Burn. Georgette Dover, MD;  Location: [place];  Service: General;  Laterality: Left;  . POSTERIOR LUMBAR FUSION 4 LEVEL N/A 04/25/2016   Procedure: LUMBAR FIVE-SACRAL ONE POSTERIOR LUMBAR INTERBODY FUSION, THORACIC NINE-SACRAL ONE POSTERIOR LATERAL ARTHRODESIS WITH PEDICLE SCREWS;  Surgeon: Earnie Larsson, MD;  Location: [place] [facility];  Service: Neurosurgery;  Laterality: N/A;  . RETINAL DETACHMENT SURGERY Right 2013  . ROBOTIC ASSITED PARTIAL NEPHRECTOMY Right 02/05/2015   Procedure: ROBOTIC ASSITED PARTIAL NEPHRECTOMY;  Surgeon: Raynelle Bring, MD;  Location[facility];  Service: Urology;  Laterality: Right;  . SIMPLE MASTECTOMY WITH AXILLARY SENTINEL NODE BIOPSY Right 07/28/2013   Procedure: RIGHT TOTAL  MASTECTOMY;  Surgeon: Imogene Burn. Georgette Dover, MD;  Location: [place];  Service: General;  Laterality: Right;  . TONSILLECTOMY  AGE 43  . TOTAL ABDOMINAL HYSTERECTOMY W/  BILATERAL SALPINGOOPHORECTOMY  1997    FAMILY HISTORY Family History  Problem Relation Age of Onset  . Heart disease Maternal Grandfather   . Diabetes Maternal Grandfather   . Colon cancer Maternal Aunt 79  . Brain cancer Paternal Grandmother        dx in 75s  . Dementia Mother   . Diabetes Mother   . Osteoporosis Mother   . Diabetes Maternal Aunt   . Prostate cancer Father 25  . Bipolar disorder Maternal Aunt   . Stroke Maternal Aunt   . Stomach cancer Paternal Uncle        dx in late 48s   the patient's mother died at the age of 29. The patient's father is alive at age 66. She had no brothers or sisters. Her father has a history of prostate cancer. There is no history of breast or ovarian cancer in the family. One maternal first cousin has a history of Hodgkin's lymphoma.   GYNECOLOGIC HISTORY:  Menarche age 31, first live birth age 16. She is GX P1. She underwent total abdominal hysterectomy and bilateral salpingo-oophorectomy in 1997 she did not take hormone replacement.   SOCIAL HISTORY: (Updated  11/15/2013)  She is a  homemaker. Her husband Arnette Norris farms approximately 500 acres, including quite a bit of tobacco. Daughter Colletta [place] lives in [place] where she works as a Pension scheme manager for a drug company. The patient has no grandchildren. She has two "grand cats". She attends a local [employer] of [employer]: Not in place   HEALTH MAINTENANCE:  Social History   Tobacco Use  . Smoking status: Never Smoker  . Smokeless tobacco: Never Used  Substance Use Topics  . Alcohol use: No    Comment: 07/28/2013 "no alcohol since 1994; never had problem w/it"  . Drug use: No     Colonoscopy: 2009/ [facility]  PAP: Status post hysterectomy  Bone density: May 10/14/2009 at [facility] was normal  Lipid panel:  Not on file   Allergies  Allergen Reactions  . Lasix [Furosemide] Nausea And Vomiting and Other (See Comments)    HEADACHE  .  Lithium Other (See Comments)    Dizziness, "cause me to fall"  . Sulfa Antibiotics Hives  . Trazodone And Nefazodone Other (See Comments)    insomnia  . Lyrica [Pregabalin] Diarrhea, Nausea Only and Rash    Current Outpatient Medications  Medication Sig Dispense Refill  . allopurinol (ZYLOPRIM) 300 MG tablet Take 1 tablet (300 mg total) by mouth daily. 90 tablet 4  . amitriptyline (ELAVIL) 150 MG tablet Take 150 mg by mouth at bedtime.    . DULoxetine (CYMBALTA) 30 MG capsule Take 2 capsules (60 mg total) by mouth daily after breakfast.    . levothyroxine (SYNTHROID, LEVOTHROID) 88 MCG tablet Take 88 mcg by mouth daily before breakfast.   4  . metoprolol tartrate (LOPRESSOR) 100 MG tablet Take 1 tablet (100 mg) by mouth once 2 hours prior to procedure 1 tablet 0  . omeprazole (PRILOSEC) 40 MG capsule TAKE 1 CAPSULE(40 MG) BY MOUTH DAILY AFTER BREAKFAST 90 capsule 0  . REXULTI 2 MG TABS Take 2 mg by mouth at bedtime.   1  . rosuvastatin (CRESTOR) 5 MG tablet Take 1 tablet (5 mg total) by mouth daily. 90 tablet 3   No current facility-administered medications for this visit.     OBJECTIVE:   There were no vitals filed for this visit. Wt Readings from Last 3 Encounters:  06/22/19 187 lb 4.8 oz (85 kg)  06/15/19 190 lb 1.6 oz (86.2 kg)  06/03/19 195 lb 1.6 oz (88.5 kg)   There is no height or weight on file to calculate BMI.    ECOG FS:1 - Symptomatic but completely ambulatory GENERAL: Patient is a well appearing female in no acute distress HEENT:  Sclerae anicteric.  Oropharynx clear and moist. No ulcerations or evidence of oropharyngeal candidiasis. Neck is supple.  NODES:  No cervical, supraclavicular, or axillary lymphadenopathy palpated.  BREAST EXAM:  Deferred. LUNGS:  Clear to auscultation bilaterally.  No wheezes or rhonchi. HEART:  Regular rate and rhythm. No murmur appreciated. ABDOMEN:  Soft, nontender.  Positive, normoactive bowel sounds. No organomegaly palpated. MSK:   No focal spinal tenderness to palpation. Full range of motion bilaterally in the upper extremities. EXTREMITIES:  No peripheral edema.   SKIN:  Clear with no obvious rashes or skin changes. No nail dyscrasia. NEURO:  Nonfocal. Well oriented.  Appropriate affect.    LAB RESULTS:  Lab Results  Component Value Date   WBC 19.4 ([name]) 06/22/2019   NEUTROABS 5.5 06/22/2019   HGB 12.3 06/22/2019   HCT 38.7 06/22/2019   MCV 87.8 06/22/2019  PLT 235 06/22/2019      Chemistry      Component Value Date/Time   NA 142 06/15/2019 0831   NA 140 03/25/2017 1300   K 4.3 06/15/2019 0831   K 4.2 03/25/2017 1300   CL 108 06/15/2019 0831   CO2 20 (L) 06/15/2019 0831   CO2 24 03/25/2017 1300   BUN 14 06/15/2019 0831   BUN 17.9 03/25/2017 1300   CREATININE 1.17 ([name]) 06/15/2019 0831   CREATININE 1.12 ([name]) 06/03/2019 1150   CREATININE 1.1 03/25/2017 1300      Component Value Date/Time   CALCIUM 9.7 06/15/2019 0831   CALCIUM 9.4 03/25/2017 1300   ALKPHOS 93 06/15/2019 0831   ALKPHOS 102 03/25/2017 1300   AST 21 06/15/2019 0831   AST 17 06/03/2019 1150   AST 41 ([name]) 03/25/2017 1300   ALT 18 06/15/2019 0831   ALT 13 06/03/2019 1150   ALT 52 03/25/2017 1300   BILITOT 0.3 06/15/2019 0831   BILITOT 0.3 06/03/2019 1150   BILITOT 0.36 03/25/2017 1300      STUDIES: Dg Chest 2 View  Result Date: 06/03/2019 CLINICAL DATA:  Shortness of breath and falling lately EXAM: CHEST - 2 VIEW COMPARISON:  Chest radiograph dated 09/16/2018 FINDINGS: Stable cardiomediastinal contours with normal heart size. The lungs are clear. No pneumothorax or pleural effusion. Surgical clips are noted overlying the right axilla. Extensive spinal hardware. No evidence of a displaced rib fracture. IMPRESSION: No evidence of a displaced rib fracture or pneumothorax. Electronically Signed   By: Audie Pinto M.D.   On: 06/03/2019 21:47    Dg Chest 2 View  Result Date: 06/03/2019 CLINICAL DATA:  Shortness of breath and  falling lately EXAM: CHEST - 2 VIEW COMPARISON:  Chest radiograph dated 09/16/2018 FINDINGS: Stable cardiomediastinal contours with normal heart size. The lungs are clear. No pneumothorax or pleural effusion. Surgical clips are noted overlying the right axilla. Extensive spinal hardware. No evidence of a displaced rib fracture. IMPRESSION: No evidence of a displaced rib fracture or pneumothorax. Electronically Signed   By: Audie Pinto M.D.   On: 06/03/2019 21:47      ASSESSMENT: 74 y.o. BRCA negative [place] woman  (1) status post right breast lumpectomy and axillary lymph node dissection in 1997 for a stage I breast cancer, treated with adjuvant radiation and tamoxifen for 5 years  (2) status post right breast lower outer quadrant biopsy 07/13/2013 for a clinical T1c N0, stage IA invasive ductal carcinoma, grade 2, estrogen receptor 100% positive, progesterone receptor 13% positive, with an MIB-1 of 33%, and HER-2 amplification by CISH with a HER2/CEP 17 ratio of 3.19, and an average HER-2 copy number per cell of 4.15  (3) status post right mastectomy 07/28/2013 for a pT1c pN0, stage IA invasive ductal carcinoma, grade 3, with close but negative margins. Prognostic panel was not repeated  (a) the patient met with Dr. Harlow Mares and has decided against reconstruction  (4) completed weekly paclitaxel x12 12/06/2913, with trastuzumab/ pertuzumab every 3 weeks; pertuzumab was held with third dose on 11/01/2013 due to diarrhea, tried a half dose on cycle 4 again with diarrhea developing  (5) trastuzumab (started 09/20/2013) continued for 1 year, last dose 09/21/2014;  (a) final echocardiogram 09/07/2014 showed an ejection fraction of 55-60%  (6) anastrozole started May 2015; completed April 2020  (a) bone density 12/15/2013 normal  (b) bone density 02/01/2016 at [facility] was normal with a T score of -1.0   OTHER PROBLEMS: (a) History of chronic  lymphoid leukemia diagnosed by flow cytometry  06/29/2013, the cells being CD5, CD20 and CD23 positive, CD10 negative.   (1) right renal mass resected on 02/05/15, consisting of atypical lymphoid proliferation composed of monotonous small lymphocytes   (2) Anemia with a normal MCV and normal ferritin-- B-12 and folate normal; stable  (3)  ibrutinib 280 milligrams daily started 08/01/202020, discontinued 06/03/2019 because of concerns that the patient was confused and not taking her medication appropriately  (4) rituximab weekly x8 started 06/15/2019    PLAN: Jyllian will  She is taking appropriate pandemic precautions.  Wilber Bihari, NP  06/29/19 8:40 AM Medical Oncology and Hematology [facility] [place], [place] [address] Tel. [phone]    Fax. [phone]

## 2019-06-29 NOTE — ED Triage Notes (Addendum)
Pt brought over from cancer center screening desk. Pt reports she has been having chest pain and SHOB since Friday. Pt states she thinks she a had a heart attack on Friday. Pt reports intense chest pain on Friday.

## 2019-06-30 ENCOUNTER — Telehealth: Payer: Self-pay | Admitting: Oncology

## 2019-06-30 ENCOUNTER — Other Ambulatory Visit: Payer: Self-pay | Admitting: Oncology

## 2019-06-30 DIAGNOSIS — R0600 Dyspnea, unspecified: Secondary | ICD-10-CM | POA: Diagnosis not present

## 2019-06-30 DIAGNOSIS — I4891 Unspecified atrial fibrillation: Secondary | ICD-10-CM | POA: Diagnosis not present

## 2019-06-30 DIAGNOSIS — F419 Anxiety disorder, unspecified: Secondary | ICD-10-CM | POA: Diagnosis not present

## 2019-06-30 DIAGNOSIS — R079 Chest pain, unspecified: Secondary | ICD-10-CM | POA: Diagnosis not present

## 2019-06-30 NOTE — Telephone Encounter (Signed)
R/s appt per 11/5 sch  message - unable to reach pt . Left message with new appt date and time

## 2019-07-05 DIAGNOSIS — R079 Chest pain, unspecified: Secondary | ICD-10-CM | POA: Diagnosis not present

## 2019-07-05 DIAGNOSIS — R0600 Dyspnea, unspecified: Secondary | ICD-10-CM | POA: Diagnosis not present

## 2019-07-05 DIAGNOSIS — F419 Anxiety disorder, unspecified: Secondary | ICD-10-CM | POA: Diagnosis not present

## 2019-07-06 ENCOUNTER — Inpatient Hospital Stay: Payer: Medicare Other

## 2019-07-06 ENCOUNTER — Other Ambulatory Visit: Payer: Self-pay | Admitting: Oncology

## 2019-07-06 ENCOUNTER — Other Ambulatory Visit: Payer: Self-pay

## 2019-07-06 ENCOUNTER — Other Ambulatory Visit: Payer: Medicare Other

## 2019-07-06 ENCOUNTER — Inpatient Hospital Stay: Payer: Medicare Other | Admitting: Physician Assistant

## 2019-07-06 ENCOUNTER — Ambulatory Visit: Payer: Medicare Other

## 2019-07-06 VITALS — BP 117/81 | HR 82 | Temp 99.1°F | Resp 16

## 2019-07-06 VITALS — BP 145/73 | HR 98 | Temp 98.7°F | Resp 18 | Ht 65.0 in | Wt 189.0 lb

## 2019-07-06 DIAGNOSIS — R05 Cough: Secondary | ICD-10-CM | POA: Diagnosis not present

## 2019-07-06 DIAGNOSIS — F419 Anxiety disorder, unspecified: Secondary | ICD-10-CM

## 2019-07-06 DIAGNOSIS — K219 Gastro-esophageal reflux disease without esophagitis: Secondary | ICD-10-CM

## 2019-07-06 DIAGNOSIS — D649 Anemia, unspecified: Secondary | ICD-10-CM | POA: Diagnosis not present

## 2019-07-06 DIAGNOSIS — C50511 Malignant neoplasm of lower-outer quadrant of right female breast: Secondary | ICD-10-CM

## 2019-07-06 DIAGNOSIS — Z85828 Personal history of other malignant neoplasm of skin: Secondary | ICD-10-CM

## 2019-07-06 DIAGNOSIS — C9110 Chronic lymphocytic leukemia of B-cell type not having achieved remission: Secondary | ICD-10-CM

## 2019-07-06 DIAGNOSIS — Z79899 Other long term (current) drug therapy: Secondary | ICD-10-CM

## 2019-07-06 DIAGNOSIS — Z8249 Family history of ischemic heart disease and other diseases of the circulatory system: Secondary | ICD-10-CM

## 2019-07-06 DIAGNOSIS — R0602 Shortness of breath: Secondary | ICD-10-CM | POA: Diagnosis not present

## 2019-07-06 DIAGNOSIS — Z9221 Personal history of antineoplastic chemotherapy: Secondary | ICD-10-CM

## 2019-07-06 DIAGNOSIS — E785 Hyperlipidemia, unspecified: Secondary | ICD-10-CM | POA: Diagnosis not present

## 2019-07-06 DIAGNOSIS — E039 Hypothyroidism, unspecified: Secondary | ICD-10-CM

## 2019-07-06 DIAGNOSIS — F329 Major depressive disorder, single episode, unspecified: Secondary | ICD-10-CM | POA: Diagnosis not present

## 2019-07-06 DIAGNOSIS — R42 Dizziness and giddiness: Secondary | ICD-10-CM | POA: Diagnosis not present

## 2019-07-06 DIAGNOSIS — R079 Chest pain, unspecified: Secondary | ICD-10-CM | POA: Diagnosis not present

## 2019-07-06 DIAGNOSIS — Z170 Estrogen receptor positive status [ER+]: Secondary | ICD-10-CM

## 2019-07-06 DIAGNOSIS — R5383 Other fatigue: Secondary | ICD-10-CM | POA: Diagnosis not present

## 2019-07-06 DIAGNOSIS — Z5112 Encounter for antineoplastic immunotherapy: Secondary | ICD-10-CM | POA: Diagnosis not present

## 2019-07-06 DIAGNOSIS — Z923 Personal history of irradiation: Secondary | ICD-10-CM | POA: Diagnosis not present

## 2019-07-06 LAB — CBC WITH DIFFERENTIAL/PLATELET
Abs Immature Granulocytes: 0.02 10*3/uL (ref 0.00–0.07)
Basophils Absolute: 0.1 10*3/uL (ref 0.0–0.1)
Basophils Relative: 1 %
Eosinophils Absolute: 0.3 10*3/uL (ref 0.0–0.5)
Eosinophils Relative: 4 %
HCT: 34.5 % — ABNORMAL LOW (ref 36.0–46.0)
Hemoglobin: 11 g/dL — ABNORMAL LOW (ref 12.0–15.0)
Immature Granulocytes: 0 %
Lymphocytes Relative: 41 %
Lymphs Abs: 3.6 10*3/uL (ref 0.7–4.0)
MCH: 27.4 pg (ref 26.0–34.0)
MCHC: 31.9 g/dL (ref 30.0–36.0)
MCV: 85.8 fL (ref 80.0–100.0)
Monocytes Absolute: 0.5 10*3/uL (ref 0.1–1.0)
Monocytes Relative: 6 %
Neutro Abs: 4.2 10*3/uL (ref 1.7–7.7)
Neutrophils Relative %: 48 %
Platelets: 172 10*3/uL (ref 150–400)
RBC: 4.02 MIL/uL (ref 3.87–5.11)
RDW: 14.3 % (ref 11.5–15.5)
WBC: 8.7 10*3/uL (ref 4.0–10.5)
nRBC: 0 % (ref 0.0–0.2)

## 2019-07-06 LAB — LACTATE DEHYDROGENASE: LDH: 198 U/L — ABNORMAL HIGH (ref 98–192)

## 2019-07-06 MED ORDER — DEXAMETHASONE SODIUM PHOSPHATE 10 MG/ML IJ SOLN
10.0000 mg | Freq: Once | INTRAMUSCULAR | Status: AC
  Administered 2019-07-06: 12:00:00 10 mg via INTRAVENOUS

## 2019-07-06 MED ORDER — DEXAMETHASONE SODIUM PHOSPHATE 10 MG/ML IJ SOLN
INTRAMUSCULAR | Status: AC
  Filled 2019-07-06: qty 1

## 2019-07-06 MED ORDER — ACETAMINOPHEN 325 MG PO TABS
650.0000 mg | ORAL_TABLET | Freq: Once | ORAL | Status: AC
  Administered 2019-07-06: 650 mg via ORAL

## 2019-07-06 MED ORDER — ACETAMINOPHEN 325 MG PO TABS
ORAL_TABLET | ORAL | Status: AC
  Filled 2019-07-06: qty 2

## 2019-07-06 MED ORDER — SODIUM CHLORIDE 0.9 % IV SOLN
375.0000 mg/m2 | Freq: Once | INTRAVENOUS | Status: AC
  Administered 2019-07-06: 800 mg via INTRAVENOUS
  Filled 2019-07-06: qty 30

## 2019-07-06 MED ORDER — DIPHENHYDRAMINE HCL 25 MG PO CAPS
ORAL_CAPSULE | ORAL | Status: AC
  Filled 2019-07-06: qty 1

## 2019-07-06 MED ORDER — SODIUM CHLORIDE 0.9 % IV SOLN
Freq: Once | INTRAVENOUS | Status: AC
  Administered 2019-07-06: 12:00:00 via INTRAVENOUS
  Filled 2019-07-06: qty 250

## 2019-07-06 MED ORDER — DIPHENHYDRAMINE HCL 25 MG PO CAPS
25.0000 mg | ORAL_CAPSULE | Freq: Once | ORAL | Status: AC
  Administered 2019-07-06: 25 mg via ORAL

## 2019-07-06 NOTE — Progress Notes (Signed)
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 First dose on 06/22/2019. Status post 1 cycle  BREAST CANCER HISTORY: From the prior summary:  [name] [name] has a history of breast cancer dating back to 1997, [name] performed a right lumpectomy and axillary lymph node dissection for what according to the patient and her family was a stage I invasive ductal breast cancer. She received radiation adjuvantly and then took tamoxifen for 5 years  In February of 2014 she had a normal screening mammography, but in November 2014 she felt a "hard lump" in her right breast. She brought this to the attention of her urologist, Dr. Izora Gala, and he set her up for bilateral diagnostic mammography with mammography at [facility] 07/13/2013. This showed her breast density to be category B. There was no mammographic abnormality noted but ultrasonography showed a 1.4 cm irregularly shaped solid mass in the right breast at the 8:00 position. This was biopsied the same day, and showed ([id number]) an invasive ductal carcinoma, grade 2, estrogen receptor 100% positive, progesterone receptor 13% positive, with an MIB-1 of 33% and HER-2 amplification with a HER-2: CEP 17 ratio of 3.19, and a HER-2 copy number percent all of 4.15.  The patient's subsequent history is  as detailed below  INTERVAL HISTORY: [name] [name] returns today for follow-up and treatment of her chronic lymphoid leukemia and estrogen receptor positive breast cancer.   She continues under observation as far as her breast cancer is concerned.  Her most recent mammogram was 04/04/2019.  It showed the breast density category B.  There was no evidence of malignancy.  She is here to start her second weekly dose of Rituxan today. She received her first dose on 06/22/2019. She tolerated it well without any noticeable side effects per patient report. However, on 10/30 the patient was lying in bed and began to feel epigastric/mid sternal chest "pressure" and "achiness". She took aspirin. When asked if her pain persisted, she states her symptoms "left that night" and had not reoccurred. She denied any associated symptoms such as diaphoresis, nausea, vomiting, worsening shortness of breath, or radiation of her pain. When she presented to the cancer center on 06/29/2019, she reported her 06/22/2019 episode of chest pain and chronic, stable shortness of breath on exertion. She was sent to the emergency room for further evaluation. Serial troponins, EKG, and CTA all within normal limits.   She is followed closely by her cardiologist, Dr. Marlou Porch and has an appointment with him next month. She had an echocardiogram performed recently to evaluate her shortness of breath which was unremarkable.   She had a 1 week ER follow up with her PCP last week as well, who modified her anxiety medications per patient report.   REVIEW OF SYSTEMS: Blakelyn reports persistent but stable fatigue. She also reports her persistent shortness of breath which is exacerbated by exertion and anxiety. She states this has been present for several months and is unchanged  at this time.  She also states that she has a chronic cough which is unchanged. She denies any fever chills, night sweats, unintentional weight loss, decreased appetite, headaches,  nausea, vomiting, bowel/bladder changes, lymphadenopathy, chest pain, palpitations, or any other issues. She denies any bleeding or bruising. Denies any signs and symptoms of infection including a sore throat, nasal congestion, skin infections, or dysuria.  A detailed ROS was otherwise non contributory.    MEDICAL HISTORY: Past Medical History:  Diagnosis Date  . Alopecia   . Anxiety   . Arthritis    "spine" (07/28/2013)  . Breast cancer ([place]) 1997; 2014   right  . CAP (community acquired pneumonia)    admission 06-04-2013, failed outpatient  . Chronic back pain    "lower back and upper neck" (07/28/2013)  . CLL (chronic lymphocytic leukemia) ([place]) 05/2013  . DDD (degenerative disc disease)   . 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 ear   . Depression   . Diverticulosis   . Elevated LFTs   . Fibromyalgia 1978  . GERD (gastroesophageal reflux disease)   . Hematuria   . History of syncope    episode 2008--  no recurrence since  . HLD (hyperlipidemia)   . Hypothyroidism   . Kidney stones 2014  . Plantar fasciitis   . Ruptured disk    one in neck and two in back  . S/P chemotherapy, time since greater than 12 weeks   . Sinus tachycardia    mild resting  . Skin cancer   . Stool incontinence    "at times recently"     ALLERGIES:  is allergic to lasix [furosemide]; lithium; sulfa antibiotics; trazodone and nefazodone; and lyrica [pregabalin].  MEDICATIONS:  Current Outpatient Medications  Medication Sig Dispense Refill  . ALPRAZolam (XANAX) 0.5 MG tablet Take 0.5 mg by mouth 2 (two) times daily.    Marland Kitchen allopurinol (ZYLOPRIM) 300 MG tablet Take 1 tablet (300 mg total) by mouth daily. 90 tablet 4  . amitriptyline (ELAVIL) 150 MG tablet Take 150 mg by mouth at bedtime.    . DULoxetine (CYMBALTA) 30 MG capsule Take 2 capsules (60 mg total) by mouth daily after breakfast.    . levothyroxine (SYNTHROID, LEVOTHROID) 88 MCG tablet Take 88 mcg by mouth daily before breakfast.   4  . metoprolol tartrate  (LOPRESSOR) 100 MG tablet Take 1 tablet (100 mg) by mouth once 2 hours prior to procedure 1 tablet 0  . omeprazole (PRILOSEC) 40 MG capsule TAKE 1 CAPSULE(40 MG) BY MOUTH DAILY AFTER BREAKFAST 90 capsule 0  . REXULTI 2 MG TABS Take 2 mg by mouth at bedtime.   1  . rosuvastatin (CRESTOR) 5 MG tablet Take 1 tablet (5 mg total) by mouth daily. 90 tablet 3   No current facility-administered medications for this visit.     SURGICAL HISTORY:  Past Surgical History:  Procedure Laterality Date  . ANTERIOR LATERAL LUMBAR FUSION 4 LEVELS Left 04/25/2016   Procedure: LEFT LUMBAR ONE-TWO, LUMBAR TWO-THREE, LUMBAR THREE-FOUR, LUMBAR FOUR-FIVE ANTERIOR LATERAL LUMBAR FUSION;  Surgeon: Earnie Larsson, MD;  Location: [place] [facility];  Service: Neurosurgery;  Laterality: Left;  . APPENDECTOMY  1997  . APPLICATION OF ROBOTIC ASSISTANCE FOR SPINAL PROCEDURE N/A 04/06/2017   Procedure: APPLICATION OF ROBOTIC ASSISTANCE FOR SPINAL PROCEDURE;  Surgeon: Earnie Larsson, MD;  Location: [place];  Service: Neurosurgery;  Laterality: N/A;  . BREAST BIOPSY Right 2014  . BREAST LUMPECTOMY Right 1997  . BREAST LUMPECTOMY WITH AXILLARY LYMPH NODE DISSECTION Right 1997  .  CATARACT EXTRACTION, BILATERAL  2019   Dr. Kathlen Mody  . CYSTOSCOPY WITH RETROGRADE PYELOGRAM, URETEROSCOPY AND STENT PLACEMENT Bilateral 06/17/2013   Procedure: CYSTOSCOPY WITH RETROGRADE PYELOGRAM, URETEROSCOPY AND LEFT DOUBLE  J STENT PLACEMENT RIGHT URETERAL HOLMIIUM LASER AND DOUBLE J STENT ;  Surgeon: Hanley Ben, MD;  Location: [place];  Service: Urology;  Laterality: Bilateral;  . HOLMIUM LASER APPLICATION Bilateral 62/83/1517   Procedure: HOLMIUM LASER APPLICATION;  Surgeon: Hanley Ben, MD;  Location: [place];  Service: Urology;  Laterality: Bilateral;  . LITHOTRIPSY Left 06/2013  . LUMBAR EPIDURAL INJECTION     has had 7 injections  . PORT-A-CATH REMOVAL Left 10/03/2014   Procedure: REMOVAL PORT-A-CATH;  Surgeon:  Donnie Mesa, MD;  Location: [place];  Service: General;  Laterality: Left;  . PORTACATH PLACEMENT Left 07/28/2013   Procedure: ATTEMPTED INSERTION PORT-A-CATH;  Surgeon: Imogene Burn. Georgette Dover, MD;  Location: [place];  Service: General;  Laterality: Left;  . POSTERIOR LUMBAR FUSION 4 LEVEL N/A 04/25/2016   Procedure: LUMBAR FIVE-SACRAL ONE POSTERIOR LUMBAR INTERBODY FUSION, THORACIC NINE-SACRAL ONE POSTERIOR LATERAL ARTHRODESIS WITH PEDICLE SCREWS;  Surgeon: Earnie Larsson, MD;  Location: [place] [facility];  Service: Neurosurgery;  Laterality: N/A;  . RETINAL DETACHMENT SURGERY Right 2013  . ROBOTIC ASSITED PARTIAL NEPHRECTOMY Right 02/05/2015   Procedure: ROBOTIC ASSITED PARTIAL NEPHRECTOMY;  Surgeon: Raynelle Bring, MD;  Location: [facility];  Service: Urology;  Laterality: Right;  . SIMPLE MASTECTOMY WITH AXILLARY SENTINEL NODE BIOPSY Right 07/28/2013   Procedure: RIGHT TOTAL  MASTECTOMY;  Surgeon: Imogene Burn. Georgette Dover, MD;  Location: [place];  Service: General;  Laterality: Right;  . TONSILLECTOMY  AGE 3  . TOTAL ABDOMINAL HYSTERECTOMY W/ BILATERAL SALPINGOOPHORECTOMY  1997   FAMILY HISTORY      Family History  Problem Relation Age of Onset  . Heart disease Maternal Grandfather   . Diabetes Maternal Grandfather   . Colon cancer Maternal Aunt 79  . Brain cancer Paternal Grandmother        dx in 81s  . Dementia Mother   . Diabetes Mother   . Osteoporosis Mother   . Diabetes Maternal Aunt   . Prostate cancer Father 52  . Bipolar disorder Maternal Aunt   . Stroke Maternal Aunt   . Stomach cancer Paternal Uncle        dx in late 55s   the patient's mother died at the age of 52. The patient's father is alive at age 85. She had no brothers or sisters. Her father has a history of prostate cancer. There is no history of breast or ovarian cancer in the family. One maternal first cousin has a history of Hodgkin's lymphoma.  GYNECOLOGIC HISTORY:  Menarche age 72, first live birth age 47.  She is GX P1. She underwent total abdominal hysterectomy and bilateral salpingo-oophorectomy in 1997 she did not take hormone replacement.  SOCIAL HISTORY: (Updated  11/15/2013)  She is a homemaker. Her husband Arnette Norris farms approximately 500 acres, including quite a bit of tobacco. Daughter Colletta [place] lives in [place] where she works as a Pension scheme manager for a drug company. The patient has no grandchildren. She has two "grand cats". She attends a local united church of [place]: Not in place   HEALTH MAINTENANCE:  (Updated   11/15/2013 )   REVIEW OF SYSTEMS:  Review of Systems  Constitutional: Positive for fatigue. Negative for appetite change, chills,  fever and unexpected weight change.  HENT: Negative for mouth sores, nosebleeds, sore throat and trouble swallowing.   Eyes: Negative for eye problems and icterus.  Respiratory: Positive for chronic cough and shortness of breath with exertion. Negative for hemoptysis and wheezing.   Cardiovascular: Negative for chest pain and leg swelling.  Gastrointestinal: Negative for abdominal pain, constipation, diarrhea, nausea and vomiting.  Genitourinary: Negative for bladder incontinence, difficulty urinating, dysuria, frequency and hematuria.   Musculoskeletal: Negative for back pain, gait problem, neck pain and neck stiffness.  Skin: Negative for itching and rash.  Neurological: Negative for dizziness, extremity weakness, gait problem, headaches, light-headedness and seizures.  Hematological: Negative for adenopathy. Does not bruise/bleed easily.  Psychiatric/Behavioral: Negative for confusion, depression and sleep disturbance. The patient is not nervous/anxious.     PHYSICAL EXAMINATION:  Blood pressure (!) 145/73, pulse 98, temperature 98.7 F (37.1 C), temperature source Temporal, resp. rate 18, height _0  (1.651 m), weight 189 lb (85.7 kg), last menstrual period  08/26/1995, SpO2 98 %.  ECOG PERFORMANCE STATUS: 1 - Symptomatic but completely ambulatory  Physical Exam  Constitutional: Oriented to person, place, and time and well-developed, well-nourished, and in no distress.  HENT:  Head: Normocephalic and atraumatic.  Mouth/Throat: Oropharynx is clear and moist. No oropharyngeal exudate.  Eyes: Conjunctivae are normal. Right eye exhibits no discharge. Left eye exhibits no discharge. No scleral icterus.  Neck: Normal range of motion. Neck supple.  Cardiovascular: Normal rate, regular rhythm, normal heart sounds and intact distal pulses.   Pulmonary/Chest: Effort normal and breath sounds normal. No respiratory distress. No wheezes. No rales.  Abdominal: Soft. Bowel sounds are normal. Exhibits no distension and no mass. There is no tenderness.  Musculoskeletal: Normal range of motion. Exhibits no edema.  Lymphadenopathy:    No cervical adenopathy.  Neurological: Alert and oriented to person, place, and time. Exhibits normal muscle tone. Gait normal. Coordination normal.  Skin: Skin is warm and dry. No rash noted. Not diaphoretic. No erythema. No pallor.  Psychiatric: Mood, memory and judgment normal.  Vitals reviewed.  LABORATORY DATA: Lab Results  Component Value Date   WBC 8.7 07/06/2019   HGB 11.0 (L) 07/06/2019   HCT 34.5 (L) 07/06/2019   MCV 85.8 07/06/2019   PLT 172 07/06/2019      Chemistry      Component Value Date/Time   NA 137 06/29/2019 1121   NA 140 03/25/2017 1300   K 4.4 06/29/2019 1121   K 4.2 03/25/2017 1300   CL 107 06/29/2019 1121   CO2 20 (L) 06/29/2019 1121   CO2 24 03/25/2017 1300   BUN 18 06/29/2019 1121   BUN 17.9 03/25/2017 1300   CREATININE 1.20 (H) 06/29/2019 1121   CREATININE 1.12 (H) 06/03/2019 1150   CREATININE 1.1 03/25/2017 1300      Component Value Date/Time   CALCIUM 9.3 06/29/2019 1121   CALCIUM 9.4 03/25/2017 1300   ALKPHOS 93 06/15/2019 0831   ALKPHOS 102 03/25/2017 1300   AST 21 06/15/2019  0831   AST 17 06/03/2019 1150   AST 41 (H) 03/25/2017 1300   ALT 18 06/15/2019 0831   ALT 13 06/03/2019 1150   ALT 52 03/25/2017 1300   BILITOT 0.3 06/15/2019 0831   BILITOT 0.3 06/03/2019 1150   BILITOT 0.36 03/25/2017 1300       RADIOGRAPHIC STUDIES:  Dg Chest 2 View  Result Date: 06/29/2019 CLINICAL DATA:  73 year old female with chest pain and shortness of breath. EXAM: CHEST - 2 VIEW COMPARISON:  Chest radiograph dated 06/03/2019 FINDINGS: The lungs are clear. There is no pleural effusion or pneumothorax. The cardiac silhouette is within normal limits. No acute osseous pathology. Partially visualized thoracolumbar spinal fixation hardware. Multiple surgical clips noted over the right lateral chest wall similar to prior radiograph. No acute osseous pathology. IMPRESSION: No active cardiopulmonary disease. Electronically Signed   By: Anner Crete M.D.   On: 06/29/2019 12:26   Ct Angio Chest Pe W And/or Wo Contrast  Result Date: 06/29/2019 CLINICAL DATA:  Chest pain and shortness of breath. EXAM: CT ANGIOGRAPHY CHEST WITH CONTRAST TECHNIQUE: Multidetector CT imaging of the chest was performed using the standard protocol during bolus administration of intravenous contrast. Multiplanar CT image reconstructions and MIPs were obtained to evaluate the vascular anatomy. CONTRAST:  172m OMNIPAQUE IOHEXOL 350 MG/ML SOLN COMPARISON:  Cardiac CTA 05/18/2019. Thoracic spine CT 05/11/2018. Chest CT 07/29/2013. FINDINGS: Cardiovascular: Pulmonary arterial opacification is adequate to the segmental level without emboli identified although motion artifact limits assessment in the lower lobes. There is no evidence of thoracic aortic dissection or aneurysm. The heart is normal in size. There is no pericardial effusion. Mediastinum/Nodes: Status post right mastectomy and right axillary dissection. No enlarged axillary, mediastinal, or hilar lymph nodes. Small sliding hiatal hernia. Unremarkable thyroid.  Lungs/Pleura: No pleural effusion or pneumothorax. Focal atelectasis or scarring laterally in the lingula, unchanged from 05/18/2019. Mild bronchiectasis in the right upper lobe. No mass. Upper Abdomen: Mildly prominent periportal lymph nodes measuring up to approximately 1.2 cm in short axis, similar to the 05/11/2018 CT. Musculoskeletal: No suspicious osseous lesion. Partially visualized posterior thoracolumbar fusion. Review of the MIP images confirms the above findings. IMPRESSION: No evidence of pulmonary emboli or other acute abnormality in the chest within mild limitations of respiratory motion artifact. Electronically Signed   By: ALogan BoresM.D.   On: 06/29/2019 15:18     ASSESSMENT/PLAN:  ASSESSMENT: 73y.o. BRCA negative [place] woman  (1) status post right breast lumpectomy and axillary lymph node dissection in 1997 for a stage I breast cancer, treated with adjuvant radiation and tamoxifen for 5 years  (2) status post right breast lower outer quadrant biopsy 07/13/2013 for a clinical T1c N0, stage IA invasive ductal carcinoma, grade 2, estrogen receptor 100% positive, progesterone receptor 13% positive, with an MIB-1 of 33%, and HER-2 amplification by FISH with a HER2/CEP 17 ratio of 3.19, and an average HER-2 copy number per cell of 4.15  (3) status post right mastectomy 07/28/2013 for a pT1c pN0, stage IA invasive ductal carcinoma, grade 3, with close but negative margins. Prognostic panel was not repeated             (a) the patient met with Dr. BHarlow Maresand has decided against reconstruction  (4) completed weekly paclitaxel x12 12/06/2013, with trastuzumab/ pertuzumab every 3 weeks; pertuzumab was held with third dose on 11/01/2013 due to diarrhea, tried a half dose on cycle 4 again with diarrhea developing  (5) trastuzumab (started 09/20/2013) continued for 1 year, last dose 09/21/2014;             (a) final echocardiogram 09/07/2014 showed an ejection fraction of  55-60%  (6) anastrozole started May 2015; completed April 2020             (a) bone density 12/15/2013 normal             (b) bone density 02/01/2016  at [facility] was normal with a T score of -1.0   OTHER PROBLEMS: (a) History of chronic lymphoid leukemia diagnosed by flow cytometry 06/29/2013, the cells being CD5, CD20 and CD23 positive, CD10 negative.              (1) right renal mass resected on 02/05/15, consisting of atypical lymphoid proliferation composed of monotonous small lymphocytes              (2) Anemia with a normal MCV and normal ferritin-- B-12 and folate normal; stable             (3)  ibrutinib 280 milligrams daily started 08/01/202020, discontinued 06/03/2019             (4) rituximab weekly x8 to start 06/15/2019  Plan:  Nanetta is doing well today. She denies any concerning complaints today. I spoke to Dr. Jana Hakim who recommends that the patient proceed with her second weekly dose of Rituxan today.   We will see her back for a follow up visit in 1 week for evaluation before starting her 3rd weekly dose of Rituxan.   Regarding her chest pain, the patient had an extensive workup which did not demonstrate a clear etiology of her chest pain. If she develops new or worsening symptoms in the interval, she knows to seek emergency evaluation.   The patient has a history of acid reflux and her pain started when she went to lay down in bed. She has an associated chronic cough worse with laying flat/nighttime. She was instructed to sit up at 90 degrees for at least 1 hour after eating and to continue to take her PPI to see if it helps prevent any reoccurring symptoms. Otherwise, she will continue to follow closely with her cardiologist who she is scheduled to see next month.   The patient was advised to call immediately if she has any concerning symptoms in the interval. The patient voices understanding of current disease status and treatment options and is in agreement with the  current care plan. All questions were answered. The patient knows to call the clinic with any problems, questions or concerns. We can certainly see the patient much sooner if necessary  No orders of the defined types were placed in this encounter.    Durk [name] L Tephanie Escorcia, PA-C 07/06/19

## 2019-07-06 NOTE — Patient Instructions (Signed)
[facility] Discharge Instructions for Patients Receiving Chemotherapy  Today you received the following chemotherapy agents: Rituximab   To help prevent nausea and vomiting after your treatment, we encourage you to take your nausea medication  as prescribed.    If you develop nausea and vomiting that is not controlled by your nausea medication, call the clinic.   BELOW ARE SYMPTOMS THAT SHOULD BE REPORTED IMMEDIATELY:  *FEVER GREATER THAN 100.5 F  *CHILLS WITH OR WITHOUT FEVER  NAUSEA AND VOMITING THAT IS NOT CONTROLLED WITH YOUR NAUSEA MEDICATION  *UNUSUAL SHORTNESS OF BREATH  *UNUSUAL BRUISING OR BLEEDING  TENDERNESS IN MOUTH AND THROAT WITH OR WITHOUT PRESENCE OF ULCERS  *URINARY PROBLEMS  *BOWEL PROBLEMS  UNUSUAL RASH Items with * indicate a potential emergency and should be followed up as soon as possible.  Feel free to call the clinic should you have any questions or concerns. The clinic phone number is [phone].  Please show the CHEMO ALERT CARD at check-in to the Emergency Department and triage nurse.   

## 2019-07-13 ENCOUNTER — Inpatient Hospital Stay: Payer: Medicare Other

## 2019-07-13 ENCOUNTER — Inpatient Hospital Stay (HOSPITAL_BASED_OUTPATIENT_CLINIC_OR_DEPARTMENT_OTHER): Payer: Medicare Other | Admitting: Adult Health

## 2019-07-13 ENCOUNTER — Encounter: Payer: Self-pay | Admitting: Adult Health

## 2019-07-13 ENCOUNTER — Other Ambulatory Visit: Payer: Self-pay

## 2019-07-13 VITALS — BP 118/65 | HR 93 | Temp 97.9°F | Resp 17

## 2019-07-13 VITALS — BP 91/54 | HR 98 | Temp 98.8°F | Resp 18 | Ht 65.0 in | Wt 185.8 lb

## 2019-07-13 DIAGNOSIS — C9110 Chronic lymphocytic leukemia of B-cell type not having achieved remission: Secondary | ICD-10-CM

## 2019-07-13 DIAGNOSIS — D649 Anemia, unspecified: Secondary | ICD-10-CM | POA: Diagnosis not present

## 2019-07-13 DIAGNOSIS — Z170 Estrogen receptor positive status [ER+]: Secondary | ICD-10-CM | POA: Diagnosis not present

## 2019-07-13 DIAGNOSIS — R0602 Shortness of breath: Secondary | ICD-10-CM

## 2019-07-13 DIAGNOSIS — R05 Cough: Secondary | ICD-10-CM | POA: Diagnosis not present

## 2019-07-13 DIAGNOSIS — C50511 Malignant neoplasm of lower-outer quadrant of right female breast: Secondary | ICD-10-CM

## 2019-07-13 DIAGNOSIS — K219 Gastro-esophageal reflux disease without esophagitis: Secondary | ICD-10-CM | POA: Diagnosis not present

## 2019-07-13 DIAGNOSIS — F329 Major depressive disorder, single episode, unspecified: Secondary | ICD-10-CM | POA: Diagnosis not present

## 2019-07-13 DIAGNOSIS — E039 Hypothyroidism, unspecified: Secondary | ICD-10-CM | POA: Diagnosis not present

## 2019-07-13 DIAGNOSIS — Z79899 Other long term (current) drug therapy: Secondary | ICD-10-CM | POA: Diagnosis not present

## 2019-07-13 DIAGNOSIS — R5383 Other fatigue: Secondary | ICD-10-CM | POA: Diagnosis not present

## 2019-07-13 DIAGNOSIS — E785 Hyperlipidemia, unspecified: Secondary | ICD-10-CM | POA: Diagnosis not present

## 2019-07-13 DIAGNOSIS — F419 Anxiety disorder, unspecified: Secondary | ICD-10-CM | POA: Diagnosis not present

## 2019-07-13 DIAGNOSIS — R079 Chest pain, unspecified: Secondary | ICD-10-CM | POA: Diagnosis not present

## 2019-07-13 DIAGNOSIS — Z5112 Encounter for antineoplastic immunotherapy: Secondary | ICD-10-CM | POA: Diagnosis not present

## 2019-07-13 DIAGNOSIS — R42 Dizziness and giddiness: Secondary | ICD-10-CM

## 2019-07-13 LAB — COMPREHENSIVE METABOLIC PANEL
ALT: 20 U/L (ref 0–44)
AST: 20 U/L (ref 15–41)
Albumin: 4.4 g/dL (ref 3.5–5.0)
Alkaline Phosphatase: 115 U/L (ref 38–126)
Anion gap: 14 (ref 5–15)
BUN: 15 mg/dL (ref 8–23)
CO2: 17 mmol/L — ABNORMAL LOW (ref 22–32)
Calcium: 9.4 mg/dL (ref 8.9–10.3)
Chloride: 107 mmol/L (ref 98–111)
Creatinine, Ser: 1.27 mg/dL — ABNORMAL HIGH (ref 0.44–1.00)
GFR calc Af Amer: 48 mL/min — ABNORMAL LOW (ref >60)
GFR calc non Af Amer: 42 mL/min — ABNORMAL LOW (ref >60)
Glucose, Bld: 120 mg/dL — ABNORMAL HIGH (ref 70–99)
Potassium: 4.6 mmol/L (ref 3.5–5.1)
Sodium: 138 mmol/L (ref 135–145)
Total Bilirubin: 0.3 mg/dL (ref 0.3–1.2)
Total Protein: 7 g/dL (ref 6.5–8.1)

## 2019-07-13 LAB — LACTATE DEHYDROGENASE: LDH: 265 U/L — ABNORMAL HIGH (ref 98–192)

## 2019-07-13 LAB — CBC WITH DIFFERENTIAL/PLATELET
Abs Immature Granulocytes: 0.02 10*3/uL (ref 0.00–0.07)
Basophils Absolute: 0.1 10*3/uL (ref 0.0–0.1)
Basophils Relative: 1 %
Eosinophils Absolute: 0.2 10*3/uL (ref 0.0–0.5)
Eosinophils Relative: 3 %
HCT: 40.5 % (ref 36.0–46.0)
Hemoglobin: 12.9 g/dL (ref 12.0–15.0)
Immature Granulocytes: 0 %
Lymphocytes Relative: 20 %
Lymphs Abs: 1.3 10*3/uL (ref 0.7–4.0)
MCH: 27.5 pg (ref 26.0–34.0)
MCHC: 31.9 g/dL (ref 30.0–36.0)
MCV: 86.4 fL (ref 80.0–100.0)
Monocytes Absolute: 0.6 10*3/uL (ref 0.1–1.0)
Monocytes Relative: 9 %
Neutro Abs: 4.3 10*3/uL (ref 1.7–7.7)
Neutrophils Relative %: 67 %
Platelets: 207 10*3/uL (ref 150–400)
RBC: 4.69 MIL/uL (ref 3.87–5.11)
RDW: 14.4 % (ref 11.5–15.5)
WBC: 6.5 10*3/uL (ref 4.0–10.5)
nRBC: 0 % (ref 0.0–0.2)

## 2019-07-13 MED ORDER — DIPHENHYDRAMINE HCL 25 MG PO CAPS
25.0000 mg | ORAL_CAPSULE | Freq: Once | ORAL | Status: AC
  Administered 2019-07-13: 14:00:00 25 mg via ORAL

## 2019-07-13 MED ORDER — SODIUM CHLORIDE 0.9 % IV SOLN
Freq: Once | INTRAVENOUS | Status: DC
  Filled 2019-07-13: qty 250

## 2019-07-13 MED ORDER — DEXAMETHASONE SODIUM PHOSPHATE 10 MG/ML IJ SOLN
10.0000 mg | Freq: Once | INTRAMUSCULAR | Status: AC
  Administered 2019-07-13: 14:00:00 10 mg via INTRAVENOUS

## 2019-07-13 MED ORDER — DEXAMETHASONE SODIUM PHOSPHATE 10 MG/ML IJ SOLN
INTRAMUSCULAR | Status: AC
  Filled 2019-07-13: qty 1

## 2019-07-13 MED ORDER — ACETAMINOPHEN 325 MG PO TABS
ORAL_TABLET | ORAL | Status: AC
  Filled 2019-07-13: qty 2

## 2019-07-13 MED ORDER — SODIUM CHLORIDE 0.9 % IV SOLN
INTRAVENOUS | Status: DC
  Administered 2019-07-13: 14:00:00 via INTRAVENOUS
  Filled 2019-07-13 (×2): qty 250

## 2019-07-13 MED ORDER — SODIUM CHLORIDE 0.9 % IV SOLN
375.0000 mg/m2 | Freq: Once | INTRAVENOUS | Status: AC
  Administered 2019-07-13: 800 mg via INTRAVENOUS
  Filled 2019-07-13: qty 30

## 2019-07-13 MED ORDER — SODIUM CHLORIDE 0.9 % IV SOLN
Freq: Once | INTRAVENOUS | Status: AC
  Administered 2019-07-13: 14:00:00 via INTRAVENOUS
  Filled 2019-07-13: qty 250

## 2019-07-13 MED ORDER — ACETAMINOPHEN 325 MG PO TABS
650.0000 mg | ORAL_TABLET | Freq: Once | ORAL | Status: AC
  Administered 2019-07-13: 14:00:00 650 mg via ORAL

## 2019-07-13 MED ORDER — DIPHENHYDRAMINE HCL 25 MG PO CAPS
ORAL_CAPSULE | ORAL | Status: AC
  Filled 2019-07-13: qty 1

## 2019-07-13 NOTE — Progress Notes (Signed)
ID: [name] [name] OB: Apr 16, 1946  MR#: [id number]  CSN#:[id number]  Patient Care Team: Deland Pretty, MD as PCP - General (Internal Medicine) Jerline Pain, MD as PCP - Cardiology (Cardiology) Magrinat, Virgie Dad, MD as Consulting Physician (Oncology) Milus Banister, MD as Attending Physician (Gastroenterology) Megan Salon, MD as Consulting Physician (Gynecology) Donnie Mesa, MD as Consulting Physician (General Surgery) Noemi Chapel, NP as Nurse Practitioner Rod Can, MD as Consulting Physician (Orthopedic Surgery)   CHIEF COMPLAINT: Estrogen receptor positive Right Breast Cancer; chronic lymphoid leukemia  CURRENT TREATMENT: rituximab   INTERVAL HISTORY: [name] [name] returns today for follow-up and treatment of her chronic lymphoid leukemia and estrogen receptor positive breast cancer.   She continues under observation as far as her breast cancer is concerned.  Her most recent mammogram was 04/04/2019.  It showed the breast density category B.  There was no evidence of malignancy.  She began Rituximab on 10/28 and has received two doses, the first 100 mg and subsequent on 11/11 full dose.  She tolerated it well.   REVIEW OF SYSTEMS: [name] [name] notes a couple of weeks ago she had to go to the ER and get an EKG and serial troponins due to an episode of chest pain.  She also underwent CTA that was normal.  She notes she has had fatigue, shortness of breath and feeling like she is going to pass out.  She is in a wheelchair today because of this.  She denies any new issues such as headaches, vision changes, focal weakness, chest pain.  She is without fever, chills, nausea, vomiting, bowel/bladderchanges.  She says her urine is clear today.  A detailed ROS was otherwise non contributory.     BREAST CANCER HISTORY: From the prior summary:  [name] [name] has a history of breast cancer dating back to 1997, [name] [name] Dr. Annabell Sabal performed a right lumpectomy and axillary lymph node dissection for  what according to the patient and her family was a stage I invasive ductal breast cancer. She received radiation adjuvantly and then took tamoxifen for 5 years  In February of 2014 she had a normal screening mammography, but in November 2014 she felt a "hard lump" in her right breast. She brought this to the attention of her urologist, Dr. Izora Gala, and he set her up for bilateral diagnostic mammography with mammography at [facility] 07/13/2013. This showed her breast density to be category B. There was no mammographic abnormality noted but ultrasonography showed a 1.4 cm irregularly shaped solid mass in the right breast at the 8:00 position. This was biopsied the same day, and showed ([id number]) an invasive ductal carcinoma, grade 2, estrogen receptor 100% positive, progesterone receptor 13% positive, with an MIB-1 of 33% and HER-2 amplification with a HER-2: CEP 17 ratio of 3.19, and a HER-2 copy number percent all of 4.15.  The patient's subsequent history is as detailed below     PAST MEDICAL HISTORY: Past Medical History:  Diagnosis Date  . Alopecia   . Anxiety   . Arthritis    "spine" (07/28/2013)  . Breast cancer ([place]) 1997; 2014   right  . CAP (community acquired pneumonia)    admission 06-04-2013, failed outpatient  . Chronic back pain    "lower back and upper neck" (07/28/2013)  . CLL (chronic lymphocytic leukemia) ([place]) 05/2013  . DDD (degenerative disc disease)   . 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 ear   . Depression   . Diverticulosis   . Elevated LFTs   . Fibromyalgia 1978  .  GERD (gastroesophageal reflux disease)   . Hematuria   . History of syncope    episode 2008--  no recurrence since  . HLD (hyperlipidemia)   . Hypothyroidism   . Kidney stones 2014  . Plantar fasciitis   . Ruptured disk    one in neck and two in back  . S/P chemotherapy, time since greater than 12 weeks   . Sinus tachycardia    mild resting  . Skin cancer   . Stool incontinence    "at times recently"     PAST  SURGICAL HISTORY: Past Surgical History:  Procedure Laterality Date  . ANTERIOR LATERAL LUMBAR FUSION 4 LEVELS Left 04/25/2016   Procedure: LEFT LUMBAR ONE-TWO, LUMBAR TWO-THREE, LUMBAR THREE-FOUR, LUMBAR FOUR-FIVE ANTERIOR LATERAL LUMBAR FUSION;  Surgeon: Earnie Larsson, MD;  Location: [place];  Service: Neurosurgery;  Laterality: Left;  . APPENDECTOMY  1997  . APPLICATION OF ROBOTIC ASSISTANCE FOR SPINAL PROCEDURE N/A 04/06/2017   Procedure: APPLICATION OF ROBOTIC ASSISTANCE FOR SPINAL PROCEDURE;  Surgeon: Earnie Larsson, MD;  Location: [place];  Service: Neurosurgery;  Laterality: N/A;  . BREAST BIOPSY Right 2014  . BREAST LUMPECTOMY Right 1997  . BREAST LUMPECTOMY WITH AXILLARY LYMPH NODE DISSECTION Right 1997  . CATARACT EXTRACTION, BILATERAL  2019   Dr. Kathlen Mody  . CYSTOSCOPY WITH RETROGRADE PYELOGRAM, URETEROSCOPY AND STENT PLACEMENT Bilateral 06/17/2013   Procedure: CYSTOSCOPY WITH RETROGRADE PYELOGRAM, URETEROSCOPY AND LEFT DOUBLE  J STENT PLACEMENT RIGHT URETERAL HOLMIIUM LASER AND DOUBLE J STENT ;  Surgeon: Hanley Ben, MD;  Location: [place];  Service: Urology;  Laterality: Bilateral;  . HOLMIUM LASER APPLICATION Bilateral 33/82/5053   Procedure: HOLMIUM LASER APPLICATION;  Surgeon: Hanley Ben, MD;  Location: [place];  Service: Urology;  Laterality: Bilateral;  . LITHOTRIPSY Left 06/2013  . LUMBAR EPIDURAL INJECTION     has had 7 injections  . PORT-A-CATH REMOVAL Left 10/03/2014   Procedure: REMOVAL PORT-A-CATH;  Surgeon: Donnie Mesa, MD;  Location: [place];  Service: General;  Laterality: Left;  . PORTACATH PLACEMENT Left 07/28/2013   Procedure: ATTEMPTED INSERTION PORT-A-CATH;  Surgeon: Imogene Burn. Georgette Dover, MD;  Location: [place];  Service: General;  Laterality: Left;  . POSTERIOR LUMBAR FUSION 4 LEVEL N/A 04/25/2016   Procedure: LUMBAR FIVE-SACRAL ONE POSTERIOR LUMBAR INTERBODY FUSION, THORACIC NINE-SACRAL ONE POSTERIOR LATERAL  ARTHRODESIS WITH PEDICLE SCREWS;  Surgeon: Earnie Larsson, MD;  Location: [place] [facility];  Service: Neurosurgery;  Laterality: N/A;  . RETINAL DETACHMENT SURGERY Right 2013  . ROBOTIC ASSITED PARTIAL NEPHRECTOMY Right 02/05/2015   Procedure: ROBOTIC ASSITED PARTIAL NEPHRECTOMY;  Surgeon: Raynelle Bring, MD;  Location: [facility];  Service: Urology;  Laterality: Right;  . SIMPLE MASTECTOMY WITH AXILLARY SENTINEL NODE BIOPSY Right 07/28/2013   Procedure: RIGHT TOTAL  MASTECTOMY;  Surgeon: Imogene Burn. Georgette Dover, MD;  Location: [place];  Service: General;  Laterality: Right;  . TONSILLECTOMY  AGE 62  . TOTAL ABDOMINAL HYSTERECTOMY W/ BILATERAL SALPINGOOPHORECTOMY  1997    FAMILY HISTORY Family History  Problem Relation Age of Onset  . Heart disease Maternal Grandfather   . Diabetes Maternal Grandfather   . Colon cancer Maternal Aunt 79  . Brain cancer Paternal Grandmother        dx in 61s  . Dementia Mother   . Diabetes Mother   . Osteoporosis Mother   . Diabetes Maternal Aunt   . Prostate cancer Father 83  . Bipolar disorder Maternal Aunt   . Stroke Maternal Aunt   .  Stomach cancer Paternal Uncle        dx in late 39s   the patient's mother died at the age of 43. The patient's father is alive at age 32. She had no brothers or sisters. Her father has a history of prostate cancer. There is no history of breast or ovarian cancer in the family. One maternal first cousin has a history of Hodgkin's lymphoma.   GYNECOLOGIC HISTORY:  Menarche age 19, first live birth age 26. She is GX P1. She underwent total abdominal hysterectomy and bilateral salpingo-oophorectomy in 1997 she did not take hormone replacement.   SOCIAL HISTORY: (Updated  11/15/2013)  She is a homemaker. Her husband Arnette Norris farms approximately 500 acres, including quite a bit of tobacco. 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 The patient has no grandchildren. She has  two "grand cats". She attends a local united church of [place]: Not in place   HEALTH MAINTENANCE:  Social History   Tobacco Use  . Smoking status: Never Smoker  . Smokeless tobacco: Never Used  Substance Use Topics  . Alcohol use: No    Comment: 07/28/2013 "no alcohol since 1994; never had problem w/it"  . Drug use: No     Colonoscopy: 2009/ [facility]  PAP: Status post hysterectomy  Bone density: May 10/14/2009 at [facility] was normal  Lipid panel:  Not on file   Allergies  Allergen Reactions  . Lasix [Furosemide] Nausea And Vomiting and Other (See Comments)    HEADACHE  . Lithium Other (See Comments)    Dizziness, "cause me to fall"  . Sulfa Antibiotics Hives  . Trazodone And Nefazodone Other (See Comments)    insomnia  . Lyrica [Pregabalin] Diarrhea, Nausea Only and Rash    Current Outpatient Medications  Medication Sig Dispense Refill  . allopurinol (ZYLOPRIM) 300 MG tablet Take 1 tablet (300 mg total) by mouth daily. 90 tablet 4  . ALPRAZolam (XANAX) 0.5 MG tablet Take 0.5 mg by mouth 2 (two) times daily.    Marland Kitchen amitriptyline (ELAVIL) 150 MG tablet Take 150 mg by mouth at bedtime.    . DULoxetine (CYMBALTA) 30 MG capsule Take 2 capsules (60 mg total) by mouth daily after breakfast.    . levothyroxine (SYNTHROID, LEVOTHROID) 88 MCG tablet Take 88 mcg by mouth daily before breakfast.   4  . omeprazole (PRILOSEC) 40 MG capsule TAKE 1 CAPSULE(40 MG) BY MOUTH DAILY AFTER BREAKFAST 90 capsule 0  . REXULTI 2 MG TABS Take 2 mg by mouth at bedtime.   1  . rosuvastatin (CRESTOR) 5 MG tablet Take 1 tablet (5 mg total) by mouth daily. 90 tablet 3  . metoprolol tartrate (LOPRESSOR) 100 MG tablet Take 1 tablet (100 mg) by mouth once 2 hours prior to procedure (Patient not taking: Reported on 07/13/2019) 1 tablet 0   No current facility-administered medications for this visit.     OBJECTIVE:   Vitals:   07/13/19 1150  BP: 110/80  Pulse: 94  Resp: 18  Temp:  98.8 F (37.1 C)  SpO2: 96%   Wt Readings from Last 3 Encounters:  07/13/19 185 lb 12.8 oz (84.3 kg)  07/06/19 189 lb (85.7 kg)  06/29/19 196 lb (88.9 kg)   Body mass index is 30.92 kg/m.    ECOG FS:1 - Symptomatic but completely ambulatory GENERAL: Patient is a well appearing female in no acute distress HEENT:  Sclerae anicteric.  Oropharynx clear and moist. No ulcerations or evidence of oropharyngeal candidiasis. Neck is supple.  NODES:  No cervical, supraclavicular, or axillary lymphadenopathy palpated.  BREAST EXAM:  Deferred. LUNGS:  Clear to auscultation bilaterally.  No wheezes or rhonchi. HEART:  Regular rate and rhythm, occasional irregular beat. No murmur appreciated. ABDOMEN:  Soft, nontender.  Positive, normoactive bowel sounds. No organomegaly palpated. MSK:  No focal spinal tenderness to palpation. Full range of motion bilaterally in the upper extremities. EXTREMITIES:  No peripheral edema.   SKIN:  Clear with no obvious rashes or skin changes. No nail dyscrasia. NEURO:  Nonfocal. Well oriented.  Appropriate affect.    LAB RESULTS:  Lab Results  Component Value Date   WBC 6.5 07/13/2019   NEUTROABS 4.3 07/13/2019   HGB 12.9 07/13/2019   HCT 40.5 07/13/2019   MCV 86.4 07/13/2019   PLT 207 07/13/2019      Chemistry      Component Value Date/Time   NA 138 07/13/2019 1123   NA 140 03/25/2017 1300   K 4.6 07/13/2019 1123   K 4.2 03/25/2017 1300   CL 107 07/13/2019 1123   CO2 17 (L) 07/13/2019 1123   CO2 24 03/25/2017 1300   BUN 15 07/13/2019 1123   BUN 17.9 03/25/2017 1300   CREATININE 1.27 (H) 07/13/2019 1123   CREATININE 1.12 (H) 06/03/2019 1150   CREATININE 1.1 03/25/2017 1300      Component Value Date/Time   CALCIUM 9.4 07/13/2019 1123   CALCIUM 9.4 03/25/2017 1300   ALKPHOS 115 07/13/2019 1123   ALKPHOS 102 03/25/2017 1300   AST 20 07/13/2019 1123   AST 17 06/03/2019 1150   AST 41 (H) 03/25/2017 1300   ALT 20 07/13/2019 1123   ALT 13  06/03/2019 1150   ALT 52 03/25/2017 1300   BILITOT 0.3 07/13/2019 1123   BILITOT 0.3 06/03/2019 1150   BILITOT 0.36 03/25/2017 1300      STUDIES: Dg Chest 2 View  Result Date: 06/29/2019 CLINICAL DATA:  73 year old female with chest pain and shortness of breath. EXAM: CHEST - 2 VIEW COMPARISON:  Chest radiograph dated 06/03/2019 FINDINGS: The lungs are clear. There is no pleural effusion or pneumothorax. The cardiac silhouette is within normal limits. No acute osseous pathology. Partially visualized thoracolumbar spinal fixation hardware. Multiple surgical clips noted over the right lateral chest wall similar to prior radiograph. No acute osseous pathology. IMPRESSION: No active cardiopulmonary disease. Electronically Signed   By: Anner Crete M.D.   On: 06/29/2019 12:26   Ct Angio Chest Pe W And/or Wo Contrast  Result Date: 06/29/2019 CLINICAL DATA:  Chest pain and shortness of breath. EXAM: CT ANGIOGRAPHY CHEST WITH CONTRAST TECHNIQUE: Multidetector CT imaging of the chest was performed using the standard protocol during bolus administration of intravenous contrast. Multiplanar CT image reconstructions and MIPs were obtained to evaluate the vascular anatomy. CONTRAST:  145m OMNIPAQUE IOHEXOL 350 MG/ML SOLN COMPARISON:  Cardiac CTA 05/18/2019. Thoracic spine CT 05/11/2018. Chest CT 07/29/2013. FINDINGS: Cardiovascular: Pulmonary arterial opacification is adequate to the segmental level without emboli identified although motion artifact limits assessment in the lower lobes. There is no evidence of thoracic aortic dissection or aneurysm. The heart is normal in size. There is no pericardial effusion. Mediastinum/Nodes: Status post right mastectomy and right axillary dissection. No enlarged axillary, mediastinal, or hilar lymph nodes. Small sliding hiatal hernia. Unremarkable thyroid. Lungs/Pleura: No pleural effusion or pneumothorax. Focal atelectasis or scarring laterally in the lingula, unchanged  from 05/18/2019. Mild bronchiectasis in  the right upper lobe. No mass. Upper Abdomen: Mildly prominent periportal lymph nodes measuring up to approximately 1.2 cm in short axis, similar to the 05/11/2018 CT. Musculoskeletal: No suspicious osseous lesion. Partially visualized posterior thoracolumbar fusion. Review of the MIP images confirms the above findings. IMPRESSION: No evidence of pulmonary emboli or other acute abnormality in the chest within mild limitations of respiratory motion artifact. Electronically Signed   By: Logan Bores M.D.   On: 06/29/2019 15:18    Dg Chest 2 View  Result Date: 06/29/2019 CLINICAL DATA:  73 year old female with chest pain and shortness of breath. EXAM: CHEST - 2 VIEW COMPARISON:  Chest radiograph dated 06/03/2019 FINDINGS: The lungs are clear. There is no pleural effusion or pneumothorax. The cardiac silhouette is within normal limits. No acute osseous pathology. Partially visualized thoracolumbar spinal fixation hardware. Multiple surgical clips noted over the right lateral chest wall similar to prior radiograph. No acute osseous pathology. IMPRESSION: No active cardiopulmonary disease. Electronically Signed   By: Anner Crete M.D.   On: 06/29/2019 12:26   Ct Angio Chest Pe W And/or Wo Contrast  Result Date: 06/29/2019 CLINICAL DATA:  Chest pain and shortness of breath. EXAM: CT ANGIOGRAPHY CHEST WITH CONTRAST TECHNIQUE: Multidetector CT imaging of the chest was performed using the standard protocol during bolus administration of intravenous contrast. Multiplanar CT image reconstructions and MIPs were obtained to evaluate the vascular anatomy. CONTRAST:  137m OMNIPAQUE IOHEXOL 350 MG/ML SOLN COMPARISON:  Cardiac CTA 05/18/2019. Thoracic spine CT 05/11/2018. Chest CT 07/29/2013. FINDINGS: Cardiovascular: Pulmonary arterial opacification is adequate to the segmental level without emboli identified although motion artifact limits assessment in the lower lobes. There is  no evidence of thoracic aortic dissection or aneurysm. The heart is normal in size. There is no pericardial effusion. Mediastinum/Nodes: Status post right mastectomy and right axillary dissection. No enlarged axillary, mediastinal, or hilar lymph nodes. Small sliding hiatal hernia. Unremarkable thyroid. Lungs/Pleura: No pleural effusion or pneumothorax. Focal atelectasis or scarring laterally in the lingula, unchanged from 05/18/2019. Mild bronchiectasis in the right upper lobe. No mass. Upper Abdomen: Mildly prominent periportal lymph nodes measuring up to approximately 1.2 cm in short axis, similar to the 05/11/2018 CT. Musculoskeletal: No suspicious osseous lesion. Partially visualized posterior thoracolumbar fusion. Review of the MIP images confirms the above findings. IMPRESSION: No evidence of pulmonary emboli or other acute abnormality in the chest within mild limitations of respiratory motion artifact. Electronically Signed   By: ALogan BoresM.D.   On: 06/29/2019 15:18      ASSESSMENT: 73y.o. BRCA negative [name] [name] woman  (1) status post right breast lumpectomy and axillary lymph node dissection in 1997 for a stage I breast cancer, treated with adjuvant radiation and tamoxifen for 5 years  (2) status post right breast lower outer quadrant biopsy 07/13/2013 for a clinical T1c N0, stage IA invasive ductal carcinoma, grade 2, estrogen receptor 100% positive, progesterone receptor 13% positive, with an MIB-1 of 33%, and HER-2 amplification by CISH with a HER2/CEP 17 ratio of 3.19, and an average HER-2 copy number per cell of 4.15  (3) status post right mastectomy 07/28/2013 for a pT1c pN0, stage IA invasive ductal carcinoma, grade 3, with close but negative margins. Prognostic panel was not repeated  (a) the patient met with Dr. BHarlow Maresand has decided against reconstruction  (4) completed weekly paclitaxel x12 12/06/2913, with trastuzumab/ pertuzumab every 3 weeks; pertuzumab was held with  third dose on 11/01/2013 due to diarrhea, tried a half dose on  cycle 4 again with diarrhea developing  (5) trastuzumab (started 09/20/2013) continued for 1 year, last dose 09/21/2014;  (a) final echocardiogram 09/07/2014 showed an ejection fraction of 55-60%  (6) anastrozole started May 2015; completed April 2020  (a) bone density 12/15/2013 normal  (b) bone density 02/01/2016 at [place] was normal with a T score of -1.0   OTHER PROBLEMS: (a) History of chronic lymphoid leukemia diagnosed by flow cytometry 06/29/2013, the cells being CD5, CD20 and CD23 positive, CD10 negative.   (1) right renal mass resected on 02/05/15, consisting of atypical lymphoid proliferation composed of monotonous small lymphocytes   (2) Anemia with a normal MCV and normal ferritin-- B-12 and folate normal; stable  (3)  ibrutinib 280 milligrams daily started 08/01/202020, discontinued 06/03/2019 because of concerns that the patient was confused and not taking her medication appropriately  (4) rituximab weekly x8 started 06/15/2019    PLAN: Babara is feeling moderately well today.  She does note some fatigue, dizziness, and mild shortness of breath.  I was concerned she might have gone into atrial fibrillation, however her EKG shows sinus rhythm.  I reviewed her EKG and symptoms with Dr. Jana Hakim, along with her labs. He wants her to receive treatment today with Rituximab and some IV fluids as well. We will get an MRI of the brain in the meantime.  She is planning a trip after thanksgiving to the beach to stay in a motel that is a tower.  She is concerned about Covid19.  I let her know that I was concerned about her traveling to a place and needing to use an elevator with other guests that may not honor the social distancing guidelines.  I recommended she call the place and inquire what they have set up as she is at increased risk for complications should she get the virus.    H[place] will return to see [place] weekly for  Rituximab, and we will see her with every other infusion.  She was recommended to continue with the appropriate pandemic precautions. She knows to call for any questions that may arise between now and her next appointment.  We are happy to see her sooner if needed.  A total of (30) minutes of face-to-face time was spent with this patient with greater than 50% of that time in counseling and care-coordination.  Wilber Bihari, NP  07/13/19 12:35 PM Medical Oncology and Hematology [facility] [place], [place] [address] Tel. [phone]    Fax. [phone]

## 2019-07-13 NOTE — Addendum Note (Signed)
Addended by: Scot Dock on: 07/13/2019 02:23 PM   Modules accepted: Orders

## 2019-07-13 NOTE — Patient Instructions (Signed)
[name] °Discharge Instructions for Patients Receiving Chemotherapy ° °Today you received the following chemotherapy agents: Rituximab ° °To help prevent nausea and vomiting after your treatment, we encourage you to take your nausea medication as directed.  °  °If you develop nausea and vomiting that is not controlled by your nausea medication, call the clinic.  ° °BELOW ARE SYMPTOMS THAT SHOULD BE REPORTED IMMEDIATELY: °· *FEVER GREATER THAN 100.5 F °· *CHILLS WITH OR WITHOUT FEVER °· NAUSEA AND VOMITING THAT IS NOT CONTROLLED WITH YOUR NAUSEA MEDICATION °· *UNUSUAL SHORTNESS OF BREATH °· *UNUSUAL BRUISING OR BLEEDING °· TENDERNESS IN MOUTH AND THROAT WITH OR WITHOUT PRESENCE OF ULCERS °· *URINARY PROBLEMS °· *BOWEL PROBLEMS °· UNUSUAL RASH °Items with * indicate a potential emergency and should be followed up as soon as possible. ° °Feel free to call the clinic should you have any questions or concerns. The clinic phone number is [phone]. ° °Please show the CHEMO ALERT CARD at check-in to the Emergency Department and triage nurse. ° ° °

## 2019-07-15 ENCOUNTER — Telehealth: Payer: Self-pay | Admitting: Oncology

## 2019-07-15 ENCOUNTER — Other Ambulatory Visit: Payer: Self-pay | Admitting: Oncology

## 2019-07-15 NOTE — Telephone Encounter (Signed)
Returned patient's phone call regarding cancelling 12/02 appointment, left a voicemail.

## 2019-07-20 ENCOUNTER — Other Ambulatory Visit: Payer: Self-pay

## 2019-07-20 ENCOUNTER — Inpatient Hospital Stay: Payer: Medicare Other

## 2019-07-20 VITALS — BP 120/70 | HR 70 | Temp 98.2°F | Resp 16

## 2019-07-20 DIAGNOSIS — C9110 Chronic lymphocytic leukemia of B-cell type not having achieved remission: Secondary | ICD-10-CM

## 2019-07-20 DIAGNOSIS — R079 Chest pain, unspecified: Secondary | ICD-10-CM | POA: Diagnosis not present

## 2019-07-20 DIAGNOSIS — F329 Major depressive disorder, single episode, unspecified: Secondary | ICD-10-CM | POA: Diagnosis not present

## 2019-07-20 DIAGNOSIS — F419 Anxiety disorder, unspecified: Secondary | ICD-10-CM | POA: Diagnosis not present

## 2019-07-20 DIAGNOSIS — R42 Dizziness and giddiness: Secondary | ICD-10-CM | POA: Diagnosis not present

## 2019-07-20 DIAGNOSIS — C50511 Malignant neoplasm of lower-outer quadrant of right female breast: Secondary | ICD-10-CM | POA: Diagnosis not present

## 2019-07-20 DIAGNOSIS — K219 Gastro-esophageal reflux disease without esophagitis: Secondary | ICD-10-CM | POA: Diagnosis not present

## 2019-07-20 DIAGNOSIS — Z79899 Other long term (current) drug therapy: Secondary | ICD-10-CM | POA: Diagnosis not present

## 2019-07-20 DIAGNOSIS — E785 Hyperlipidemia, unspecified: Secondary | ICD-10-CM | POA: Diagnosis not present

## 2019-07-20 DIAGNOSIS — D649 Anemia, unspecified: Secondary | ICD-10-CM | POA: Diagnosis not present

## 2019-07-20 DIAGNOSIS — E039 Hypothyroidism, unspecified: Secondary | ICD-10-CM | POA: Diagnosis not present

## 2019-07-20 DIAGNOSIS — Z170 Estrogen receptor positive status [ER+]: Secondary | ICD-10-CM | POA: Diagnosis not present

## 2019-07-20 DIAGNOSIS — R0602 Shortness of breath: Secondary | ICD-10-CM | POA: Diagnosis not present

## 2019-07-20 DIAGNOSIS — R5383 Other fatigue: Secondary | ICD-10-CM | POA: Diagnosis not present

## 2019-07-20 DIAGNOSIS — R05 Cough: Secondary | ICD-10-CM | POA: Diagnosis not present

## 2019-07-20 DIAGNOSIS — Z5112 Encounter for antineoplastic immunotherapy: Secondary | ICD-10-CM | POA: Diagnosis not present

## 2019-07-20 LAB — CBC WITH DIFFERENTIAL/PLATELET
Abs Immature Granulocytes: 0.02 10*3/uL (ref 0.00–0.07)
Basophils Absolute: 0.1 10*3/uL (ref 0.0–0.1)
Basophils Relative: 1 %
Eosinophils Absolute: 0.2 10*3/uL (ref 0.0–0.5)
Eosinophils Relative: 4 %
HCT: 36.6 % (ref 36.0–46.0)
Hemoglobin: 11.8 g/dL — ABNORMAL LOW (ref 12.0–15.0)
Immature Granulocytes: 0 %
Lymphocytes Relative: 16 %
Lymphs Abs: 0.9 10*3/uL (ref 0.7–4.0)
MCH: 27.5 pg (ref 26.0–34.0)
MCHC: 32.2 g/dL (ref 30.0–36.0)
MCV: 85.3 fL (ref 80.0–100.0)
Monocytes Absolute: 0.5 10*3/uL (ref 0.1–1.0)
Monocytes Relative: 8 %
Neutro Abs: 4.1 10*3/uL (ref 1.7–7.7)
Neutrophils Relative %: 71 %
Platelets: 201 10*3/uL (ref 150–400)
RBC: 4.29 MIL/uL (ref 3.87–5.11)
RDW: 14.6 % (ref 11.5–15.5)
WBC: 5.8 10*3/uL (ref 4.0–10.5)
nRBC: 0 % (ref 0.0–0.2)

## 2019-07-20 LAB — LACTATE DEHYDROGENASE: LDH: 249 U/L — ABNORMAL HIGH (ref 98–192)

## 2019-07-20 MED ORDER — DIPHENHYDRAMINE HCL 25 MG PO CAPS
25.0000 mg | ORAL_CAPSULE | Freq: Once | ORAL | Status: AC
  Administered 2019-07-20: 25 mg via ORAL

## 2019-07-20 MED ORDER — DEXAMETHASONE SODIUM PHOSPHATE 10 MG/ML IJ SOLN
INTRAMUSCULAR | Status: AC
  Filled 2019-07-20: qty 1

## 2019-07-20 MED ORDER — DEXAMETHASONE SODIUM PHOSPHATE 10 MG/ML IJ SOLN
10.0000 mg | Freq: Once | INTRAMUSCULAR | Status: AC
  Administered 2019-07-20: 10 mg via INTRAVENOUS

## 2019-07-20 MED ORDER — ACETAMINOPHEN 325 MG PO TABS
ORAL_TABLET | ORAL | Status: AC
  Filled 2019-07-20: qty 2

## 2019-07-20 MED ORDER — SODIUM CHLORIDE 0.9 % IV SOLN
375.0000 mg/m2 | Freq: Once | INTRAVENOUS | Status: AC
  Administered 2019-07-20: 800 mg via INTRAVENOUS
  Filled 2019-07-20: qty 50

## 2019-07-20 MED ORDER — ACETAMINOPHEN 325 MG PO TABS
650.0000 mg | ORAL_TABLET | Freq: Once | ORAL | Status: AC
  Administered 2019-07-20: 650 mg via ORAL

## 2019-07-20 MED ORDER — DIPHENHYDRAMINE HCL 25 MG PO CAPS
ORAL_CAPSULE | ORAL | Status: AC
  Filled 2019-07-20: qty 1

## 2019-07-20 MED ORDER — SODIUM CHLORIDE 0.9 % IV SOLN
Freq: Once | INTRAVENOUS | Status: AC
  Administered 2019-07-20: 12:00:00 via INTRAVENOUS
  Filled 2019-07-20: qty 250

## 2019-07-22 ENCOUNTER — Telehealth: Payer: Self-pay | Admitting: Oncology

## 2019-07-22 NOTE — Telephone Encounter (Signed)
Patient called to cancel 12/02 appointment, patient will not be in town that week and will need all 12/02 appointments cancelled.  Message to provider.

## 2019-07-24 NOTE — Progress Notes (Signed)
Patient called to reschedule  °

## 2019-07-25 ENCOUNTER — Inpatient Hospital Stay (HOSPITAL_BASED_OUTPATIENT_CLINIC_OR_DEPARTMENT_OTHER): Payer: Medicare Other | Admitting: Oncology

## 2019-07-25 ENCOUNTER — Other Ambulatory Visit: Payer: Self-pay | Admitting: Oncology

## 2019-07-25 ENCOUNTER — Telehealth: Payer: Self-pay | Admitting: Oncology

## 2019-07-25 DIAGNOSIS — Z170 Estrogen receptor positive status [ER+]: Secondary | ICD-10-CM

## 2019-07-25 DIAGNOSIS — C50511 Malignant neoplasm of lower-outer quadrant of right female breast: Secondary | ICD-10-CM

## 2019-07-25 DIAGNOSIS — C9110 Chronic lymphocytic leukemia of B-cell type not having achieved remission: Secondary | ICD-10-CM

## 2019-07-25 NOTE — Telephone Encounter (Signed)
Left message re 12/9 appointments on both home/cell. Schedule mailed.

## 2019-07-27 ENCOUNTER — Other Ambulatory Visit: Payer: Medicare Other

## 2019-07-27 ENCOUNTER — Ambulatory Visit: Payer: Medicare Other | Admitting: Oncology

## 2019-07-27 ENCOUNTER — Ambulatory Visit: Payer: Medicare Other

## 2019-07-29 ENCOUNTER — Ambulatory Visit: Payer: Medicare Other | Admitting: Cardiology

## 2019-07-29 DIAGNOSIS — S20212A Contusion of left front wall of thorax, initial encounter: Secondary | ICD-10-CM | POA: Diagnosis not present

## 2019-08-02 DIAGNOSIS — Z780 Asymptomatic menopausal state: Secondary | ICD-10-CM | POA: Diagnosis not present

## 2019-08-02 DIAGNOSIS — R269 Unspecified abnormalities of gait and mobility: Secondary | ICD-10-CM | POA: Diagnosis not present

## 2019-08-02 DIAGNOSIS — M546 Pain in thoracic spine: Secondary | ICD-10-CM | POA: Diagnosis not present

## 2019-08-02 DIAGNOSIS — G629 Polyneuropathy, unspecified: Secondary | ICD-10-CM | POA: Diagnosis not present

## 2019-08-02 DIAGNOSIS — R000 Tachycardia, unspecified: Secondary | ICD-10-CM | POA: Diagnosis not present

## 2019-08-02 DIAGNOSIS — Z0001 Encounter for general adult medical examination with abnormal findings: Secondary | ICD-10-CM | POA: Diagnosis not present

## 2019-08-02 DIAGNOSIS — E039 Hypothyroidism, unspecified: Secondary | ICD-10-CM | POA: Diagnosis not present

## 2019-08-02 DIAGNOSIS — M159 Polyosteoarthritis, unspecified: Secondary | ICD-10-CM | POA: Diagnosis not present

## 2019-08-02 NOTE — Progress Notes (Signed)
ID: [name] [name] OB: Jan 02, 1946  MR#: [id number]  POE#:[id number]  Patient Care Team: Deland Pretty, MD as PCP - General (Internal Medicine) Jerline Pain, MD as PCP - Cardiology (Cardiology) Magrinat, Virgie Dad, MD as Consulting Physician (Oncology) Milus Banister, MD as Attending Physician (Gastroenterology) Megan Salon, MD as Consulting Physician (Gynecology) Donnie Mesa, MD as Consulting Physician (General Surgery) Noemi Chapel, NP as Nurse Practitioner Rod Can, MD as Consulting Physician (Orthopedic Surgery)   CHIEF COMPLAINT: Estrogen receptor positive Right Breast Cancer; chronic lymphoid leukemia  CURRENT TREATMENT: rituximab   INTERVAL HISTORY: [name] [name] returns today for follow-up and treatment of her chronic lymphoid leukemia and estrogen receptor positive breast cancer.   She continues under observation as far as her breast cancer is concerned.  Her most recent mammogram was 04/04/2019.  It showed the breast density category B.  There was no evidence of malignancy.  She began Rituximab on 10/28, the first 100 mg and subsequent on 11/11 full dose.  She receives this weekly.  Today is week 5.  She missed last week due to being at the beach with [place].    REVIEW OF SYSTEMS: [name] [name] has unfortunately fallen twice since we saw her last.  She fell and hit underneath her left chest wall/lateral rib cage each time.  This happened in the early morning hours around 4 am.  She cannot remember falling.  She doesn't remember what happens prior to the fall.  She isn't sure if she gets dizzy or what.  She just remember the impact of falling.  She was evaluated by urgent care afterwards and there was no fracture noted.  She is tapering off the Xanax after seeing Dr. Shelia Media yesterday.    [name] [name] drinks about 2-3 16 ounce bottles per day.  She is going to f/u with Dr. Shelia Media about this in 1 week and again in 6 weeks.  [name] [name] has shortness of breath.  This is worse since falling.  She  underwent xrays yesterday with Dr. Shelia Media.    [name] [name] denies any fever, chills, chest pain, palpitations, cough, shortness of breath, headaches, nausea, vomiting, bowel/bladder changes.  A detailed ROS Was otherwise non contributory.    BREAST CANCER HISTORY: From the prior summary:  [name] [name] has a history of breast cancer dating back to 1997, [name] performed a right lumpectomy and axillary lymph node dissection for what according to the patient and her family was a stage I invasive ductal breast cancer. She received radiation adjuvantly and then took tamoxifen for 5 years  In February of 2014 she had a normal screening mammography, but in November 2014 she felt a "hard lump" in her right breast. She brought this to the attention of her urologist, Dr. Izora Gala, and he set her up for bilateral diagnostic mammography with mammography at [facility] 07/13/2013. This showed her breast density to be category B. There was no mammographic abnormality noted but ultrasonography showed a 1.4 cm irregularly shaped solid mass in the right breast at the 8:00 position. This was biopsied the same day, and showed ([id number]) an invasive ductal carcinoma, grade 2, estrogen receptor 100% positive, progesterone receptor 13% positive, with an MIB-1 of 33% and HER-2 amplification with a HER-2: CEP 17 ratio of 3.19, and a HER-2 copy number percent all of 4.15.  The patient's subsequent history is as detailed below     PAST MEDICAL HISTORY: Past Medical History:  Diagnosis Date  . Alopecia   . Anxiety   .  Arthritis    "spine" (07/28/2013)  . Breast cancer ([place]) 1997; 2014   right  . CAP (community acquired pneumonia)    admission 06-04-2013, failed outpatient  . Chronic back pain    "lower back and upper neck" (07/28/2013)  . CLL (chronic lymphocytic leukemia) ([place]) 05/2013  . DDD (degenerative disc disease)   . 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 ear   . Depression   . Diverticulosis   . Elevated LFTs   . Fibromyalgia  1978  . GERD (gastroesophageal reflux disease)   . Hematuria   . History of syncope    episode 2008--  no recurrence since  . HLD (hyperlipidemia)   . Hypothyroidism   . Kidney stones 2014  . Plantar fasciitis   . Ruptured disk    one in neck and two in back  . S/P chemotherapy, time since greater than 12 weeks   . Sinus tachycardia    mild resting  . Skin cancer   . Stool incontinence    "at times recently"     PAST SURGICAL HISTORY: Past Surgical History:  Procedure Laterality Date  . ANTERIOR LATERAL LUMBAR FUSION 4 LEVELS Left 04/25/2016   Procedure: LEFT LUMBAR ONE-TWO, LUMBAR TWO-THREE, LUMBAR THREE-FOUR, LUMBAR FOUR-FIVE ANTERIOR LATERAL LUMBAR FUSION;  Surgeon: Earnie Larsson, MD;  Location: [place] [facility];  Service: Neurosurgery;  Laterality: Left;  . APPENDECTOMY  1997  . APPLICATION OF ROBOTIC ASSISTANCE FOR SPINAL PROCEDURE N/A 04/06/2017   Procedure: APPLICATION OF ROBOTIC ASSISTANCE FOR SPINAL PROCEDURE;  Surgeon: Earnie Larsson, MD;  Location: [place];  Service: Neurosurgery;  Laterality: N/A;  . BREAST BIOPSY Right 2014  . BREAST LUMPECTOMY Right 1997  . BREAST LUMPECTOMY WITH AXILLARY LYMPH NODE DISSECTION Right 1997  . CATARACT EXTRACTION, BILATERAL  2019   Dr. Kathlen Mody  . CYSTOSCOPY WITH RETROGRADE PYELOGRAM, URETEROSCOPY AND STENT PLACEMENT Bilateral 06/17/2013   Procedure: CYSTOSCOPY WITH RETROGRADE PYELOGRAM, URETEROSCOPY AND LEFT DOUBLE  J STENT PLACEMENT RIGHT URETERAL HOLMIIUM LASER AND DOUBLE J STENT ;  Surgeon: Hanley Ben, MD;  Location: [place];  Service: Urology;  Laterality: Bilateral;  . HOLMIUM LASER APPLICATION Bilateral 62/10/5595   Procedure: HOLMIUM LASER APPLICATION;  Surgeon: Hanley Ben, MD;  Location: [place];  Service: Urology;  Laterality: Bilateral;  . LITHOTRIPSY Left 06/2013  . LUMBAR EPIDURAL INJECTION     has had 7 injections  . PORT-A-CATH REMOVAL Left 10/03/2014   Procedure: REMOVAL PORT-A-CATH;   Surgeon: Donnie Mesa, MD;  Location: [place];  Service: General;  Laterality: Left;  . PORTACATH PLACEMENT Left 07/28/2013   Procedure: ATTEMPTED INSERTION PORT-A-CATH;  Surgeon: Imogene Burn. Georgette Dover, MD;  Location: [place];  Service: General;  Laterality: Left;  . POSTERIOR LUMBAR FUSION 4 LEVEL N/A 04/25/2016   Procedure: LUMBAR FIVE-SACRAL ONE POSTERIOR LUMBAR INTERBODY FUSION, THORACIC NINE-SACRAL ONE POSTERIOR LATERAL ARTHRODESIS WITH PEDICLE SCREWS;  Surgeon: Earnie Larsson, MD;  Location: [place] [facility];  Service: Neurosurgery;  Laterality: N/A;  . RETINAL DETACHMENT SURGERY Right 2013  . ROBOTIC ASSITED PARTIAL NEPHRECTOMY Right 02/05/2015   Procedure: ROBOTIC ASSITED PARTIAL NEPHRECTOMY;  Surgeon: Raynelle Bring, MD;  Location: [facility];  Service: Urology;  Laterality: Right;  . SIMPLE MASTECTOMY WITH AXILLARY SENTINEL NODE BIOPSY Right 07/28/2013   Procedure: RIGHT TOTAL  MASTECTOMY;  Surgeon: Imogene Burn. Georgette Dover, MD;  Location: [place];  Service: General;  Laterality: Right;  . TONSILLECTOMY  AGE 67  . TOTAL ABDOMINAL HYSTERECTOMY W/ BILATERAL SALPINGOOPHORECTOMY  1997    FAMILY  HISTORY Family History  Problem Relation Age of Onset  . Heart disease Maternal Grandfather   . Diabetes Maternal Grandfather   . Colon cancer Maternal Aunt 79  . Brain cancer Paternal Grandmother        dx in 43s  . Dementia Mother   . Diabetes Mother   . Osteoporosis Mother   . Diabetes Maternal Aunt   . Prostate cancer Father 3  . Bipolar disorder Maternal Aunt   . Stroke Maternal Aunt   . Stomach cancer Paternal Uncle        dx in late 40s   the patient's mother died at the age of 26. The patient's father is alive at age 33. She had no brothers or sisters. Her father has a history of prostate cancer. There is no history of breast or ovarian cancer in the family. One maternal first cousin has a history of Hodgkin's lymphoma.   GYNECOLOGIC HISTORY:  Menarche age 21, first live birth age 76. She is  GX P1. She underwent total abdominal hysterectomy and bilateral salpingo-oophorectomy in 1997 she did not take hormone replacement.   SOCIAL HISTORY: (Updated  11/15/2013)  She is a homemaker. Her husband Arnette Norris farms approximately 500 acres, including quite a bit of tobacco. 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 The patient has no grandchildren. She has two "grand cats". She attends a local united church of [place]: Not in place   HEALTH MAINTENANCE:  Social History   Tobacco Use  . Smoking status: Never Smoker  . Smokeless tobacco: Never Used  Substance Use Topics  . Alcohol use: No    Comment: 07/28/2013 "no alcohol since 1994; never had problem w/it"  . Drug use: No     Colonoscopy: 2009/ [facility]  PAP: Status post hysterectomy  Bone density: [facility] 10/14/[facility] was normal  Lipid panel:  Not on file   Allergies  Allergen Reactions  . Lasix [Furosemide] Nausea And Vomiting and Other (See Comments)    HEADACHE  . Lithium Other (See Comments)    Dizziness, "cause me to fall"  . Sulfa Antibiotics Hives  . Trazodone And Nefazodone Other (See Comments)    insomnia  . Lyrica [Pregabalin] Diarrhea, Nausea Only and Rash    Current Outpatient Medications  Medication Sig Dispense Refill  . allopurinol (ZYLOPRIM) 300 MG tablet Take 1 tablet (300 mg total) by mouth daily. 90 tablet 4  . ALPRAZolam (XANAX) 0.5 MG tablet Take 0.5 mg by mouth 2 (two) times daily.    Marland Kitchen amitriptyline (ELAVIL) 150 MG tablet Take 150 mg by mouth at bedtime.    . DULoxetine (CYMBALTA) 30 MG capsule Take 2 capsules (60 mg total) by mouth daily after breakfast.    . levothyroxine (SYNTHROID, LEVOTHROID) 88 MCG tablet Take 88 mcg by mouth daily before breakfast.   4  . metoprolol tartrate (LOPRESSOR) 100 MG tablet Take 1 tablet (100 mg) by mouth once 2 hours prior to procedure 1 tablet 0  . omeprazole  (PRILOSEC) 40 MG capsule TAKE 1 CAPSULE(40 MG) BY MOUTH DAILY AFTER BREAKFAST 90 capsule 0  . REXULTI 2 MG TABS Take 2 mg by mouth at bedtime.   1  . rosuvastatin (CRESTOR) 5 MG tablet Take 1 tablet (5 mg total) by mouth daily. 90 tablet 3   No current facility-administered medications for this visit.     OBJECTIVE:   Vitals:   08/03/19  1204  BP: (!) 94/59  Pulse: 91  Resp: 18  Temp: 98.7 F (37.1 C)  SpO2: 93%   Wt Readings from Last 3 Encounters:  08/03/19 185 lb 14.4 oz (84.3 kg)  07/13/19 185 lb 12.8 oz (84.3 kg)  07/06/19 189 lb (85.7 kg)   Body mass index is 30.94 kg/m.    ECOG FS:1 - Symptomatic but completely ambulatory GENERAL: Patient is a thin older woman examined in wheelchair in no apparent distress HEENT:  Sclerae anicteric.  Oropharynx clear and moist. No ulcerations or evidence of oropharyngeal candidiasis. Neck is supple.  NODES:  No cervical, supraclavicular, or axillary lymphadenopathy palpated.  LUNGS:  Clear to auscultation bilaterally.  No wheezes or rhonchi. HEART:  Regular rate and rhythm, occasional irregular beat. No murmur appreciated. ABDOMEN:  Soft, nontender.  Positive, normoactive bowel sounds. MSK:  No focal spinal tenderness to palpation.  EXTREMITIES:  No peripheral edema.   SKIN:  Clear with no obvious rashes or skin changes. No nail dyscrasia. NEURO:  Nonfocal. Well oriented.  Appropriate affect.    LAB RESULTS:  Lab Results  Component Value Date   WBC 7.1 08/03/2019   NEUTROABS 4.9 08/03/2019   HGB 12.0 08/03/2019   HCT 38.0 08/03/2019   MCV 85.2 08/03/2019   PLT 215 08/03/2019      Chemistry      Component Value Date/Time   NA 138 07/13/2019 1123   NA 140 03/25/2017 1300   K 4.6 07/13/2019 1123   K 4.2 03/25/2017 1300   CL 107 07/13/2019 1123   CO2 17 (L) 07/13/2019 1123   CO2 24 03/25/2017 1300   BUN 15 07/13/2019 1123   BUN 17.9 03/25/2017 1300   CREATININE 1.27 (H) 07/13/2019 1123   CREATININE 1.12 (H)  06/03/2019 1150   CREATININE 1.1 03/25/2017 1300      Component Value Date/Time   CALCIUM 9.4 07/13/2019 1123   CALCIUM 9.4 03/25/2017 1300   ALKPHOS 115 07/13/2019 1123   ALKPHOS 102 03/25/2017 1300   AST 20 07/13/2019 1123   AST 17 06/03/2019 1150   AST 41 (H) 03/25/2017 1300   ALT 20 07/13/2019 1123   ALT 13 06/03/2019 1150   ALT 52 03/25/2017 1300   BILITOT 0.3 07/13/2019 1123   BILITOT 0.3 06/03/2019 1150   BILITOT 0.36 03/25/2017 1300      STUDIES: No results found.  No results found.    ASSESSMENT: 73 y.o. BRCA negative [place] woman  (1) status post right breast lumpectomy and axillary lymph node dissection in 1997 for a stage I breast cancer, treated with adjuvant radiation and tamoxifen for 5 years  (2) status post right breast lower outer quadrant biopsy 07/13/2013 for a clinical T1c N0, stage IA invasive ductal carcinoma, grade 2, estrogen receptor 100% positive, progesterone receptor 13% positive, with an MIB-1 of 33%, and HER-2 amplification by CISH with a HER2/CEP 17 ratio of 3.19, and an average HER-2 copy number per cell of 4.15  (3) status post right mastectomy 07/28/2013 for a pT1c pN0, stage IA invasive ductal carcinoma, grade 3, with close but negative margins. Prognostic panel was not repeated  (a) the patient met with Dr. Harlow Mares and has decided against reconstruction  (4) completed weekly paclitaxel x12 12/06/2913, with trastuzumab/ pertuzumab every 3 weeks; pertuzumab was held with third dose on 11/01/2013 due to diarrhea, tried a half dose on cycle 4 again with diarrhea developing  (5) trastuzumab (started 09/20/2013) continued for 1 year, last dose 09/21/2014;  (  a) final echocardiogram 09/07/2014 showed an ejection fraction of 55-60%  (6) anastrozole started May 2015; completed April 2020  (a) bone density 12/15/2013 normal  (b) bone density 02/01/2016 at [facility] was normal with a T score of -1.0   OTHER PROBLEMS: (a) History of chronic  lymphoid leukemia diagnosed by flow cytometry 06/29/2013, the cells being CD5, CD20 and CD23 positive, CD10 negative.   (1) right renal mass resected on 02/05/15, consisting of atypical lymphoid proliferation composed of monotonous small lymphocytes   (2) Anemia with a normal MCV and normal ferritin-- B-12 and folate normal; stable  (3)  ibrutinib 280 milligrams daily started 08/01/202020, discontinued 06/03/2019 because of concerns that the patient was confused and not taking her medication appropriately  (4) rituximab weekly x8 started 06/15/2019    PLAN: [name] [name] is doing moderately well. She is receiving the Rituximab weekly and is tolerating that well.  Considering her issues with dizziness/falling, Dr. Jana Hakim and I discussed that she is not a good candidate for receiving the rapid rituximab infusions and will continue with regular infusions.    I am concerned about her falls.  She is taking several medications that can contribute to this.  As patients get older and lose muscle mass medication metabilization can certainly be affected.  I think that is what we are seeing here.  I reviewed her medication list with Dr. Jana Hakim.  We are happy that she is following with her PCP Dr. Shelia Media closely and agree with the initial step of tapering off the Xanax, and then exploring how to decrease her other night time medications.    [name] [name] will return to see [place] weekly for Rituximab, and we will see her with each infusion considering her dizziness and falls.  She is scheduled for a brain MRI on 08/11/2019.    [place] was recommended to continue with the appropriate pandemic precautions. She knows to call for any questions that may arise between now and her next appointment.  We are happy to see her sooner if needed.  A total of (30) minutes of face-to-face time was spent with this patient with greater than 50% of that time in counseling and care-coordination.  [name] Bihari, NP  08/03/19 12:37 PM Medical  Oncology and Hematology [facility] [place], [place] [address] Tel. [phone]    Fax. [phone]

## 2019-08-03 ENCOUNTER — Inpatient Hospital Stay: Payer: Medicare Other

## 2019-08-03 ENCOUNTER — Other Ambulatory Visit: Payer: Self-pay

## 2019-08-03 ENCOUNTER — Encounter: Payer: Self-pay | Admitting: Adult Health

## 2019-08-03 ENCOUNTER — Inpatient Hospital Stay (HOSPITAL_BASED_OUTPATIENT_CLINIC_OR_DEPARTMENT_OTHER): Payer: Medicare Other | Admitting: Adult Health

## 2019-08-03 ENCOUNTER — Inpatient Hospital Stay: Payer: Medicare Other | Attending: Oncology

## 2019-08-03 VITALS — BP 94/59 | HR 91 | Temp 98.7°F | Resp 18 | Ht 65.0 in | Wt 185.9 lb

## 2019-08-03 VITALS — BP 107/50 | HR 83 | Temp 98.8°F | Resp 18

## 2019-08-03 DIAGNOSIS — C9110 Chronic lymphocytic leukemia of B-cell type not having achieved remission: Secondary | ICD-10-CM | POA: Diagnosis not present

## 2019-08-03 DIAGNOSIS — E039 Hypothyroidism, unspecified: Secondary | ICD-10-CM | POA: Diagnosis not present

## 2019-08-03 DIAGNOSIS — Z8249 Family history of ischemic heart disease and other diseases of the circulatory system: Secondary | ICD-10-CM | POA: Diagnosis not present

## 2019-08-03 DIAGNOSIS — Z833 Family history of diabetes mellitus: Secondary | ICD-10-CM | POA: Insufficient documentation

## 2019-08-03 DIAGNOSIS — C50511 Malignant neoplasm of lower-outer quadrant of right female breast: Secondary | ICD-10-CM | POA: Insufficient documentation

## 2019-08-03 DIAGNOSIS — Z79899 Other long term (current) drug therapy: Secondary | ICD-10-CM

## 2019-08-03 DIAGNOSIS — Z9221 Personal history of antineoplastic chemotherapy: Secondary | ICD-10-CM | POA: Diagnosis not present

## 2019-08-03 DIAGNOSIS — Z5112 Encounter for antineoplastic immunotherapy: Secondary | ICD-10-CM | POA: Diagnosis not present

## 2019-08-03 DIAGNOSIS — R0781 Pleurodynia: Secondary | ICD-10-CM | POA: Insufficient documentation

## 2019-08-03 DIAGNOSIS — Z170 Estrogen receptor positive status [ER+]: Secondary | ICD-10-CM | POA: Diagnosis not present

## 2019-08-03 DIAGNOSIS — Z8042 Family history of malignant neoplasm of prostate: Secondary | ICD-10-CM

## 2019-08-03 DIAGNOSIS — Z8262 Family history of osteoporosis: Secondary | ICD-10-CM | POA: Diagnosis not present

## 2019-08-03 DIAGNOSIS — F329 Major depressive disorder, single episode, unspecified: Secondary | ICD-10-CM | POA: Diagnosis not present

## 2019-08-03 DIAGNOSIS — E785 Hyperlipidemia, unspecified: Secondary | ICD-10-CM

## 2019-08-03 DIAGNOSIS — F419 Anxiety disorder, unspecified: Secondary | ICD-10-CM | POA: Diagnosis not present

## 2019-08-03 DIAGNOSIS — K219 Gastro-esophageal reflux disease without esophagitis: Secondary | ICD-10-CM

## 2019-08-03 LAB — CBC WITH DIFFERENTIAL/PLATELET
Abs Immature Granulocytes: 0.02 10*3/uL (ref 0.00–0.07)
Basophils Absolute: 0.1 10*3/uL (ref 0.0–0.1)
Basophils Relative: 1 %
Eosinophils Absolute: 0.3 10*3/uL (ref 0.0–0.5)
Eosinophils Relative: 4 %
HCT: 38 % (ref 36.0–46.0)
Hemoglobin: 12 g/dL (ref 12.0–15.0)
Immature Granulocytes: 0 %
Lymphocytes Relative: 17 %
Lymphs Abs: 1.2 10*3/uL (ref 0.7–4.0)
MCH: 26.9 pg (ref 26.0–34.0)
MCHC: 31.6 g/dL (ref 30.0–36.0)
MCV: 85.2 fL (ref 80.0–100.0)
Monocytes Absolute: 0.6 10*3/uL (ref 0.1–1.0)
Monocytes Relative: 9 %
Neutro Abs: 4.9 10*3/uL (ref 1.7–7.7)
Neutrophils Relative %: 69 %
Platelets: 215 10*3/uL (ref 150–400)
RBC: 4.46 MIL/uL (ref 3.87–5.11)
RDW: 14.7 % (ref 11.5–15.5)
WBC: 7.1 10*3/uL (ref 4.0–10.5)
nRBC: 0 % (ref 0.0–0.2)

## 2019-08-03 LAB — LACTATE DEHYDROGENASE: LDH: 257 U/L — ABNORMAL HIGH (ref 98–192)

## 2019-08-03 MED ORDER — ACETAMINOPHEN 325 MG PO TABS
ORAL_TABLET | ORAL | Status: AC
  Filled 2019-08-03: qty 2

## 2019-08-03 MED ORDER — SODIUM CHLORIDE 0.9 % IV SOLN
375.0000 mg/m2 | Freq: Once | INTRAVENOUS | Status: AC
  Administered 2019-08-03: 14:00:00 800 mg via INTRAVENOUS
  Filled 2019-08-03: qty 50

## 2019-08-03 MED ORDER — DEXAMETHASONE SODIUM PHOSPHATE 10 MG/ML IJ SOLN
INTRAMUSCULAR | Status: AC
  Filled 2019-08-03: qty 1

## 2019-08-03 MED ORDER — SODIUM CHLORIDE 0.9 % IV SOLN
Freq: Once | INTRAVENOUS | Status: AC
  Administered 2019-08-03: 13:00:00 via INTRAVENOUS
  Filled 2019-08-03: qty 250

## 2019-08-03 MED ORDER — DIPHENHYDRAMINE HCL 25 MG PO CAPS
25.0000 mg | ORAL_CAPSULE | Freq: Once | ORAL | Status: AC
  Administered 2019-08-03: 25 mg via ORAL

## 2019-08-03 MED ORDER — ACETAMINOPHEN 325 MG PO TABS
650.0000 mg | ORAL_TABLET | Freq: Once | ORAL | Status: AC
  Administered 2019-08-03: 650 mg via ORAL

## 2019-08-03 MED ORDER — DIPHENHYDRAMINE HCL 25 MG PO CAPS
ORAL_CAPSULE | ORAL | Status: AC
  Filled 2019-08-03: qty 1

## 2019-08-03 MED ORDER — DEXAMETHASONE SODIUM PHOSPHATE 10 MG/ML IJ SOLN
10.0000 mg | Freq: Once | INTRAMUSCULAR | Status: AC
  Administered 2019-08-03: 10 mg via INTRAVENOUS

## 2019-08-03 NOTE — Progress Notes (Signed)
Not candidate for rapid rituximab. Pt has hypotension today. Had reaction (SOB/weakness) w/ 1st dose of rituximab 100 mg. Recent falls.  Kennith Center, Pharm.D., CPP 08/03/2019@12 :55 PM

## 2019-08-03 NOTE — Patient Instructions (Signed)
[facility] °Discharge Instructions for Patients Receiving Chemotherapy ° °Today you received the following chemotherapy agents: Rituximab ° °To help prevent nausea and vomiting after your treatment, we encourage you to take your nausea medication as directed.  °  °If you develop nausea and vomiting that is not controlled by your nausea medication, call the clinic.  ° °BELOW ARE SYMPTOMS THAT SHOULD BE REPORTED IMMEDIATELY: °· *FEVER GREATER THAN 100.5 F °· *CHILLS WITH OR WITHOUT FEVER °· NAUSEA AND VOMITING THAT IS NOT CONTROLLED WITH YOUR NAUSEA MEDICATION °· *UNUSUAL SHORTNESS OF BREATH °· *UNUSUAL BRUISING OR BLEEDING °· TENDERNESS IN MOUTH AND THROAT WITH OR WITHOUT PRESENCE OF ULCERS °· *URINARY PROBLEMS °· *BOWEL PROBLEMS °· UNUSUAL RASH °Items with * indicate a potential emergency and should be followed up as soon as possible. ° °Feel free to call the clinic should you have any questions or concerns. The clinic phone number is [phone]. ° °Please show the CHEMO ALERT CARD at check-in to the Emergency Department and triage nurse. ° ° °

## 2019-08-04 ENCOUNTER — Ambulatory Visit: Payer: Medicare Other | Admitting: Cardiology

## 2019-08-04 LAB — BETA 2 MICROGLOBULIN, SERUM: Beta-2 Microglobulin: 3.3 mg/L — ABNORMAL HIGH (ref 0.6–2.4)

## 2019-08-08 ENCOUNTER — Telehealth: Payer: Self-pay | Admitting: Adult Health

## 2019-08-08 NOTE — Telephone Encounter (Signed)
I left a message regarding schedule  

## 2019-08-10 ENCOUNTER — Encounter: Payer: Self-pay | Admitting: Adult Health

## 2019-08-10 ENCOUNTER — Inpatient Hospital Stay: Payer: Medicare Other

## 2019-08-10 ENCOUNTER — Other Ambulatory Visit: Payer: Self-pay

## 2019-08-10 ENCOUNTER — Inpatient Hospital Stay (HOSPITAL_BASED_OUTPATIENT_CLINIC_OR_DEPARTMENT_OTHER): Payer: Medicare Other | Admitting: Adult Health

## 2019-08-10 VITALS — BP 107/57 | HR 87 | Temp 99.6°F | Resp 17

## 2019-08-10 VITALS — BP 122/66 | HR 97 | Temp 98.0°F | Resp 17 | Ht 65.0 in | Wt 186.9 lb

## 2019-08-10 DIAGNOSIS — Z170 Estrogen receptor positive status [ER+]: Secondary | ICD-10-CM

## 2019-08-10 DIAGNOSIS — F329 Major depressive disorder, single episode, unspecified: Secondary | ICD-10-CM

## 2019-08-10 DIAGNOSIS — R0781 Pleurodynia: Secondary | ICD-10-CM | POA: Diagnosis not present

## 2019-08-10 DIAGNOSIS — Z79899 Other long term (current) drug therapy: Secondary | ICD-10-CM | POA: Diagnosis not present

## 2019-08-10 DIAGNOSIS — Z8042 Family history of malignant neoplasm of prostate: Secondary | ICD-10-CM

## 2019-08-10 DIAGNOSIS — C50511 Malignant neoplasm of lower-outer quadrant of right female breast: Secondary | ICD-10-CM | POA: Diagnosis not present

## 2019-08-10 DIAGNOSIS — Z8249 Family history of ischemic heart disease and other diseases of the circulatory system: Secondary | ICD-10-CM | POA: Diagnosis not present

## 2019-08-10 DIAGNOSIS — Z5112 Encounter for antineoplastic immunotherapy: Secondary | ICD-10-CM | POA: Diagnosis not present

## 2019-08-10 DIAGNOSIS — E785 Hyperlipidemia, unspecified: Secondary | ICD-10-CM

## 2019-08-10 DIAGNOSIS — E039 Hypothyroidism, unspecified: Secondary | ICD-10-CM | POA: Diagnosis not present

## 2019-08-10 DIAGNOSIS — K219 Gastro-esophageal reflux disease without esophagitis: Secondary | ICD-10-CM | POA: Diagnosis not present

## 2019-08-10 DIAGNOSIS — F419 Anxiety disorder, unspecified: Secondary | ICD-10-CM | POA: Diagnosis not present

## 2019-08-10 DIAGNOSIS — C9110 Chronic lymphocytic leukemia of B-cell type not having achieved remission: Secondary | ICD-10-CM | POA: Diagnosis not present

## 2019-08-10 DIAGNOSIS — Z833 Family history of diabetes mellitus: Secondary | ICD-10-CM

## 2019-08-10 DIAGNOSIS — Z8262 Family history of osteoporosis: Secondary | ICD-10-CM | POA: Diagnosis not present

## 2019-08-10 DIAGNOSIS — Z9221 Personal history of antineoplastic chemotherapy: Secondary | ICD-10-CM | POA: Diagnosis not present

## 2019-08-10 LAB — COMPREHENSIVE METABOLIC PANEL
ALT: 12 U/L (ref 0–44)
AST: 15 U/L (ref 15–41)
Albumin: 4.1 g/dL (ref 3.5–5.0)
Alkaline Phosphatase: 131 U/L — ABNORMAL HIGH (ref 38–126)
Anion gap: 12 (ref 5–15)
BUN: 18 mg/dL (ref 8–23)
CO2: 20 mmol/L — ABNORMAL LOW (ref 22–32)
Calcium: 9.2 mg/dL (ref 8.9–10.3)
Chloride: 108 mmol/L (ref 98–111)
Creatinine, Ser: 1.12 mg/dL — ABNORMAL HIGH (ref 0.44–1.00)
GFR calc Af Amer: 56 mL/min — ABNORMAL LOW (ref >60)
GFR calc non Af Amer: 49 mL/min — ABNORMAL LOW (ref >60)
Glucose, Bld: 99 mg/dL (ref 70–99)
Potassium: 4.2 mmol/L (ref 3.5–5.1)
Sodium: 140 mmol/L (ref 135–145)
Total Bilirubin: 0.3 mg/dL (ref 0.3–1.2)
Total Protein: 6.6 g/dL (ref 6.5–8.1)

## 2019-08-10 LAB — CBC WITH DIFFERENTIAL/PLATELET
Abs Immature Granulocytes: 0.03 10*3/uL (ref 0.00–0.07)
Basophils Absolute: 0.1 10*3/uL (ref 0.0–0.1)
Basophils Relative: 1 %
Eosinophils Absolute: 0.2 10*3/uL (ref 0.0–0.5)
Eosinophils Relative: 3 %
HCT: 39.8 % (ref 36.0–46.0)
Hemoglobin: 12.4 g/dL (ref 12.0–15.0)
Immature Granulocytes: 1 %
Lymphocytes Relative: 17 %
Lymphs Abs: 1.1 10*3/uL (ref 0.7–4.0)
MCH: 27 pg (ref 26.0–34.0)
MCHC: 31.2 g/dL (ref 30.0–36.0)
MCV: 86.5 fL (ref 80.0–100.0)
Monocytes Absolute: 0.8 10*3/uL (ref 0.1–1.0)
Monocytes Relative: 12 %
Neutro Abs: 4.4 10*3/uL (ref 1.7–7.7)
Neutrophils Relative %: 66 %
Platelets: 218 10*3/uL (ref 150–400)
RBC: 4.6 MIL/uL (ref 3.87–5.11)
RDW: 15 % (ref 11.5–15.5)
WBC: 6.5 10*3/uL (ref 4.0–10.5)
nRBC: 0 % (ref 0.0–0.2)

## 2019-08-10 LAB — LACTATE DEHYDROGENASE: LDH: 288 U/L — ABNORMAL HIGH (ref 98–192)

## 2019-08-10 MED ORDER — DEXAMETHASONE SODIUM PHOSPHATE 10 MG/ML IJ SOLN
INTRAMUSCULAR | Status: AC
  Filled 2019-08-10: qty 1

## 2019-08-10 MED ORDER — DIPHENHYDRAMINE HCL 25 MG PO CAPS
ORAL_CAPSULE | ORAL | Status: AC
  Filled 2019-08-10: qty 1

## 2019-08-10 MED ORDER — ACETAMINOPHEN 325 MG PO TABS
ORAL_TABLET | ORAL | Status: AC
  Filled 2019-08-10: qty 2

## 2019-08-10 MED ORDER — SODIUM CHLORIDE 0.9 % IV SOLN
375.0000 mg/m2 | Freq: Once | INTRAVENOUS | Status: AC
  Administered 2019-08-10: 800 mg via INTRAVENOUS
  Filled 2019-08-10: qty 50

## 2019-08-10 MED ORDER — ACETAMINOPHEN 325 MG PO TABS
650.0000 mg | ORAL_TABLET | Freq: Once | ORAL | Status: AC
  Administered 2019-08-10: 650 mg via ORAL

## 2019-08-10 MED ORDER — DIPHENHYDRAMINE HCL 25 MG PO CAPS
25.0000 mg | ORAL_CAPSULE | Freq: Once | ORAL | Status: AC
  Administered 2019-08-10: 25 mg via ORAL

## 2019-08-10 MED ORDER — SODIUM CHLORIDE 0.9 % IV SOLN
Freq: Once | INTRAVENOUS | Status: AC
  Filled 2019-08-10: qty 250

## 2019-08-10 MED ORDER — DEXAMETHASONE SODIUM PHOSPHATE 10 MG/ML IJ SOLN
10.0000 mg | Freq: Once | INTRAMUSCULAR | Status: AC
  Administered 2019-08-10: 10 mg via INTRAVENOUS

## 2019-08-10 NOTE — Patient Instructions (Signed)
Rituximab injection What is this medicine? RITUXIMAB (ri TUX i mab) is a monoclonal antibody. It is used to treat certain types of cancer like non-Hodgkin lymphoma and chronic lymphocytic leukemia. It is also used to treat rheumatoid arthritis, granulomatosis with polyangiitis (or Wegener's granulomatosis), microscopic polyangiitis, and pemphigus vulgaris. This medicine may be used for other purposes; ask your health care provider or pharmacist if you have questions. COMMON BRAND NAME(S): Rituxan, RUXIENCE What should I tell my health care provider before I take this medicine? They need to know if you have any of these conditions:  heart disease  infection (especially a virus infection such as hepatitis B, chickenpox, cold sores, or herpes)  immune system problems  irregular heartbeat  kidney disease  low blood counts, like low white cell, platelet, or red cell counts  lung or breathing disease, like asthma  recently received or scheduled to receive a vaccine  an unusual or allergic reaction to rituximab, other medicines, foods, dyes, or preservatives  pregnant or trying to get pregnant  breast-feeding How should I use this medicine? This medicine is for infusion into a vein. It is administered in a hospital or clinic by a specially trained health care professional. A special MedGuide will be given to you by the pharmacist with each prescription and refill. Be sure to read this information carefully each time. Talk to your pediatrician regarding the use of this medicine in children. This medicine is not approved for use in children. Overdosage: If you think you have taken too much of this medicine contact a poison control center or emergency room at once. NOTE: This medicine is only for you. Do not share this medicine with others. What if I miss a dose? It is important not to miss a dose. Call your doctor or health care professional if you are unable to keep an appointment. What  may interact with this medicine?  cisplatin  live virus vaccines This list may not describe all possible interactions. Give your health care provider a list of all the medicines, herbs, non-prescription drugs, or dietary supplements you use. Also tell them if you smoke, drink alcohol, or use illegal drugs. Some items may interact with your medicine. What should I watch for while using this medicine? Your condition will be monitored carefully while you are receiving this medicine. You may need blood work done while you are taking this medicine. This medicine can cause serious allergic reactions. To reduce your risk you may need to take medicine before treatment with this medicine. Take your medicine as directed. In some patients, this medicine may cause a serious brain infection that may cause death. If you have any problems seeing, thinking, speaking, walking, or standing, tell your healthcare professional right away. If you cannot reach your healthcare professional, urgently seek other source of medical care. Call your doctor or health care professional for advice if you get a fever, chills or sore throat, or other symptoms of a cold or flu. Do not treat yourself. This drug decreases your body's ability to fight infections. Try to avoid being around people who are sick. Do not become pregnant while taking this medicine or for at least 12 months after stopping it. Women should inform their doctor if they wish to become pregnant or think they might be pregnant. There is a potential for serious side effects to an unborn child. Talk to your health care professional or pharmacist for more information. Do not breast-feed an infant while taking this medicine or for at   least 6 months after stopping it. What side effects may I notice from receiving this medicine? Side effects that you should report to your doctor or health care professional as soon as possible:  allergic reactions like skin rash, itching or  hives; swelling of the face, lips, or tongue  breathing problems  chest pain  changes in vision  diarrhea  headache with fever, neck stiffness, sensitivity to light, nausea, or confusion  fast, irregular heartbeat  loss of memory  low blood counts - this medicine may decrease the number of white blood cells, red blood cells and platelets. You may be at increased risk for infections and bleeding.  mouth sores  problems with balance, talking, or walking  redness, blistering, peeling or loosening of the skin, including inside the mouth  signs of infection - fever or chills, cough, sore throat, pain or difficulty passing urine  signs and symptoms of kidney injury like trouble passing urine or change in the amount of urine  signs and symptoms of liver injury like dark yellow or brown urine; general ill feeling or flu-like symptoms; light-colored stools; loss of appetite; nausea; right upper belly pain; unusually weak or tired; yellowing of the eyes or skin  signs and symptoms of low blood pressure like dizziness; feeling faint or lightheaded, falls; unusually weak or tired  stomach pain  swelling of the ankles, feet, hands  unusual bleeding or bruising  vomiting Side effects that usually do not require medical attention (report to your doctor or health care professional if they continue or are bothersome):  headache  joint pain  muscle cramps or muscle pain  nausea  tiredness This list may not describe all possible side effects. Call your doctor for medical advice about side effects. You may report side effects to FDA at 1-800-FDA-1088. Where should I keep my medicine? This drug is given in a hospital or clinic and will not be stored at home. NOTE: This sheet is a summary. It may not cover all possible information. If you have questions about this medicine, talk to your doctor, pharmacist, or health care provider.  2020 [employer]vier/Gold Standard (2018-09-22  22:01:36)  Coronavirus (COVID-19) Are you at risk?  Are you at risk for the Coronavirus (COVID-19)?  To be considered HIGH RISK for Coronavirus (COVID-19), you have to meet the following criteria:  . Traveled to [place], [place], [place], [place] or [place]; or in the [place] to [place], [place], [place], or [place]; and have fever, cough, and shortness of breath within the last 2 weeks of travel OR . Been in close contact with a person diagnosed with COVID-19 within the last 2 weeks and have fever, cough, and shortness of breath . IF YOU DO NOT MEET THESE CRITERIA, YOU ARE CONSIDERED LOW RISK FOR COVID-19.  What to do if you are HIGH RISK for COVID-19?  . If you are having a medical emergency, call 911. . Seek medical care right away. Before you go to a doctor's office, urgent care or emergency department, call ahead and tell them about your recent travel, contact with someone diagnosed with COVID-19, and your symptoms. You should receive instructions from your physician's office regarding next steps of care.  . When you arrive at healthcare provider, tell the healthcare staff immediately you have returned from visiting [place], [place], [place], [place] or [place]; or traveled in the [place] to [place], [place], [place], or [place]; in the last two weeks or you have been in   close contact with a person diagnosed with COVID-19 in the last 2 weeks.   . Tell the health care staff about your symptoms: fever, cough and shortness of breath. . After you have been seen by a medical provider, you will be either: o Tested for (COVID-19) and discharged home on quarantine except to seek medical care if symptoms worsen, and asked to  - Stay home and avoid contact with others until you get your results (4-5 days)  - Avoid travel on public transportation if possible (such as bus, train, or airplane) or o Sent to the Emergency Department by EMS for evaluation, COVID-19 testing, and  possible admission depending on your condition and test results.  What to do if you are LOW RISK for COVID-19?  Reduce your risk of any infection by using the same precautions used for avoiding the common cold or flu:  . Wash your hands often with soap and warm water for at least 20 seconds.  If soap and water are not readily available, use an alcohol-based hand sanitizer with at least 60% alcohol.  . If coughing or sneezing, cover your mouth and nose by coughing or sneezing into the elbow areas of your shirt or coat, into a tissue or into your sleeve (not your hands). . Avoid shaking hands with others and consider head nods or verbal greetings only. . Avoid touching your eyes, nose, or mouth with unwashed hands.  . Avoid close contact with people who are sick. . Avoid places or events with large numbers of people in one location, like concerts or sporting events. . Carefully consider travel plans you have or are making. . If you are planning any travel outside or inside the [place], visit the CDC's Travelers' Health webpage for the latest health notices. . If you have some symptoms but not all symptoms, continue to monitor at home and seek medical attention if your symptoms worsen. . If you are having a medical emergency, call 911.   ADDITIONAL HEALTHCARE OPTIONS FOR PATIENTS  [facility] / e-Visit: [email or web address]         [facility]: [phone]  [facility]: [phone]                   [facility]: [phone]   

## 2019-08-10 NOTE — Progress Notes (Signed)
ID: [name] [name] OB: 05/15/1946  MR#: [id number]  SHF#:[id number]  Patient Care Team: Deland Pretty, MD as PCP - General (Internal Medicine) Jerline Pain, MD as PCP - Cardiology (Cardiology) Magrinat, Virgie Dad, MD as Consulting Physician (Oncology) Milus Banister, MD as Attending Physician (Gastroenterology) Megan Salon, MD as Consulting Physician (Gynecology) Donnie Mesa, MD as Consulting Physician (General Surgery) Noemi Chapel, NP as Nurse Practitioner Rod Can, MD as Consulting Physician (Orthopedic Surgery)   CHIEF COMPLAINT: Estrogen receptor positive Right Breast Cancer; chronic lymphoid leukemia  CURRENT TREATMENT: rituximab   INTERVAL HISTORY: [name] [name] returns today for follow-up and treatment of her chronic lymphoid leukemia and estrogen receptor positive breast cancer.   She continues under observation as far as her breast cancer is concerned.  Her most recent mammogram was 04/04/2019.  It showed the breast density category B.  There was no evidence of malignancy.  She began Rituximab on 10/28, the first 100 mg and subsequent on 11/11 full dose.  She receives this weekly.  Today is week 6 of her treatment.    REVIEW OF SYSTEMS: [name] [name] is feeling well today.  She has no new issues.  She notes continued pain at her left rib cage.  Since we talked about her rib cage pain secondary [place] she has had another visit with Dr. Shelia Media.  He recommended that [name] [name] continue with anti inflammatories, and heat.  She has had no further falls, and has tapered off of her xanax.  Dr. Shelia Media also placed an order for PT.  She is planning on doing this after Christmas.    [name] [name] will undergo MRI brain tomorrow.  She has occasional constipation and wants to know if taking colace is ok.  She denies any fever, chills, chest pain, palpitations, cough, shortness of breath, bladder changes, nausea, vomiting, skin rash or lesion, vision issues, or headaches.  A detailed ROS was  otherwise non contributory.    BREAST CANCER HISTORY: From the prior summary:  [name] [name] has a history of breast cancer dating back to 1997, [name] [name] Dr. Annabell Sabal performed a right lumpectomy and axillary lymph node dissection for what according to the patient and her family was a stage I invasive ductal breast cancer. She received radiation adjuvantly and then took tamoxifen for 5 years  In February of 2014 she had a normal screening mammography, but in November 2014 she felt a "hard lump" in her right breast. She brought this to the attention of her urologist, Dr. Izora Gala, and he set her up for bilateral diagnostic mammography with mammography at [facility] 07/13/2013. This showed her breast density to be category B. There was no mammographic abnormality noted but ultrasonography showed a 1.4 cm irregularly shaped solid mass in the right breast at the 8:00 position. This was biopsied the same day, and showed ([id number]) an invasive ductal carcinoma, grade 2, estrogen receptor 100% positive, progesterone receptor 13% positive, with an MIB-1 of 33% and HER-2 amplification with a HER-2: CEP 17 ratio of 3.19, and a HER-2 copy number percent all of 4.15.  The patient's subsequent history is as detailed below     PAST MEDICAL HISTORY: Past Medical History:  Diagnosis Date  . Alopecia   . Anxiety   . Arthritis    "spine" (07/28/2013)  . Breast cancer ([place]) 1997; 2014   right  . CAP (community acquired pneumonia)    admission 06-04-2013, failed outpatient  . Chronic back pain    "lower back and  upper neck" (07/28/2013)  . CLL (chronic lymphocytic leukemia) ([place]) 05/2013  . DDD (degenerative disc disease)   . 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 ear   . Depression   . Diverticulosis   . Elevated LFTs   . Fibromyalgia 1978  . GERD (gastroesophageal reflux disease)   . Hematuria   . History of syncope    episode 2008--  no recurrence since  . HLD (hyperlipidemia)   . Hypothyroidism   . Kidney stones 2014  .  Plantar fasciitis   . Ruptured disk    one in neck and two in back  . S/P chemotherapy, time since greater than 12 weeks   . Sinus tachycardia    mild resting  . Skin cancer   . Stool incontinence    "at times recently"     PAST SURGICAL HISTORY: Past Surgical History:  Procedure Laterality Date  . ANTERIOR LATERAL LUMBAR FUSION 4 LEVELS Left 04/25/2016   Procedure: LEFT LUMBAR ONE-TWO, LUMBAR TWO-THREE, LUMBAR THREE-FOUR, LUMBAR FOUR-FIVE ANTERIOR LATERAL LUMBAR FUSION;  Surgeon: Earnie Larsson, MD;  Location: [place] [facility];  Service: Neurosurgery;  Laterality: Left;  . APPENDECTOMY  1997  . APPLICATION OF ROBOTIC ASSISTANCE FOR SPINAL PROCEDURE N/A 04/06/2017   Procedure: APPLICATION OF ROBOTIC ASSISTANCE FOR SPINAL PROCEDURE;  Surgeon: Earnie Larsson, MD;  Location: [place];  Service: Neurosurgery;  Laterality: N/A;  . BREAST BIOPSY Right 2014  . BREAST LUMPECTOMY Right 1997  . BREAST LUMPECTOMY WITH AXILLARY LYMPH NODE DISSECTION Right 1997  . CATARACT EXTRACTION, BILATERAL  2019   Dr. Kathlen Mody  . CYSTOSCOPY WITH RETROGRADE PYELOGRAM, URETEROSCOPY AND STENT PLACEMENT Bilateral 06/17/2013   Procedure: CYSTOSCOPY WITH RETROGRADE PYELOGRAM, URETEROSCOPY AND LEFT DOUBLE  J STENT PLACEMENT RIGHT URETERAL HOLMIIUM LASER AND DOUBLE J STENT ;  Surgeon: Hanley Ben, MD;  Location: [place];  Service: Urology;  Laterality: Bilateral;  . HOLMIUM LASER APPLICATION Bilateral 73/53/2992   Procedure: HOLMIUM LASER APPLICATION;  Surgeon: Hanley Ben, MD;  Location: [place];  Service: Urology;  Laterality: Bilateral;  . LITHOTRIPSY Left 06/2013  . LUMBAR EPIDURAL INJECTION     has had 7 injections  . PORT-A-CATH REMOVAL Left 10/03/2014   Procedure: REMOVAL PORT-A-CATH;  Surgeon: Donnie Mesa, MD;  Location: [place];  Service: General;  Laterality: Left;  . PORTACATH PLACEMENT Left 07/28/2013   Procedure: ATTEMPTED INSERTION PORT-A-CATH;  Surgeon:  Imogene Burn. Georgette Dover, MD;  Location: [place];  Service: General;  Laterality: Left;  . POSTERIOR LUMBAR FUSION 4 LEVEL N/A 04/25/2016   Procedure: LUMBAR FIVE-SACRAL ONE POSTERIOR LUMBAR INTERBODY FUSION, THORACIC NINE-SACRAL ONE POSTERIOR LATERAL ARTHRODESIS WITH PEDICLE SCREWS;  Surgeon: Earnie Larsson, MD;  Location: [place] [facility];  Service: Neurosurgery;  Laterality: N/A;  . RETINAL DETACHMENT SURGERY Right 2013  . ROBOTIC ASSITED PARTIAL NEPHRECTOMY Right 02/05/2015   Procedure: ROBOTIC ASSITED PARTIAL NEPHRECTOMY;  Surgeon: Raynelle Bring, MD;  Location: [facility];  Service: Urology;  Laterality: Right;  . SIMPLE MASTECTOMY WITH AXILLARY SENTINEL NODE BIOPSY Right 07/28/2013   Procedure: RIGHT TOTAL  MASTECTOMY;  Surgeon: Imogene Burn. Georgette Dover, MD;  Location: [place];  Service: General;  Laterality: Right;  . TONSILLECTOMY  AGE 88  . TOTAL ABDOMINAL HYSTERECTOMY W/ BILATERAL SALPINGOOPHORECTOMY  1997    FAMILY HISTORY Family History  Problem Relation Age of Onset  . Heart disease Maternal Grandfather   . Diabetes Maternal Grandfather   . Colon cancer Maternal Aunt 79  . Brain cancer Paternal Grandmother  dx in 59s  . Dementia Mother   . Diabetes Mother   . Osteoporosis Mother   . Diabetes Maternal Aunt   . Prostate cancer Father 64  . Bipolar disorder Maternal Aunt   . Stroke Maternal Aunt   . Stomach cancer Paternal Uncle        dx in late 21s   the patient's mother died at the age of 53. The patient's father is alive at age 7. She had no brothers or sisters. Her father has a history of prostate cancer. There is no history of breast or ovarian cancer in the family. One maternal first cousin has a history of Hodgkin's lymphoma.   GYNECOLOGIC HISTORY:  Menarche age 72, first live birth age 71. She is GX P1. She underwent total abdominal hysterectomy and bilateral salpingo-oophorectomy in 1997 she did not take hormone replacement.   SOCIAL HISTORY: (Updated  11/15/2013)  She is a homemaker. Her  husband Arnette Norris farms approximately 500 acres, including quite a bit of tobacco. Daughter Colletta [place] lives in [place] where she works as a Pension scheme manager for a drug company. The patient has no grandchildren. She has two "grand cats". She attends a local united church of [place]: Not in place   HEALTH MAINTENANCE:  Social History   Tobacco Use  . Smoking status: Never Smoker  . Smokeless tobacco: Never Used  Substance Use Topics  . Alcohol use: No    Comment: 07/28/2013 "no alcohol since 1994; never had problem w/it"  . Drug use: No     Colonoscopy: 2009/ [facility]  PAP: Status post hysterectomy  Bone density: May 10/14/2009 at [facility] was normal  Lipid panel:  Not on file   Allergies  Allergen Reactions  . Lasix [Furosemide] Nausea And Vomiting and Other (See Comments)    HEADACHE  . Lithium Other (See Comments)    Dizziness, "cause me to fall"  . Sulfa Antibiotics Hives  . Trazodone And Nefazodone Other (See Comments)    insomnia  . Lyrica [Pregabalin] Diarrhea, Nausea Only and Rash    Current Outpatient Medications  Medication Sig Dispense Refill  . allopurinol (ZYLOPRIM) 300 MG tablet Take 1 tablet (300 mg total) by mouth daily. 90 tablet 4  . amitriptyline (ELAVIL) 150 MG tablet Take 150 mg by mouth at bedtime.    . DULoxetine (CYMBALTA) 30 MG capsule Take 2 capsules (60 mg total) by mouth daily after breakfast.    . levothyroxine (SYNTHROID, LEVOTHROID) 88 MCG tablet Take 88 mcg by mouth daily before breakfast.   4  . metoprolol tartrate (LOPRESSOR) 100 MG tablet Take 1 tablet (100 mg) by mouth once 2 hours prior to procedure 1 tablet 0  . omeprazole (PRILOSEC) 40 MG capsule TAKE 1 CAPSULE(40 MG) BY MOUTH DAILY AFTER BREAKFAST 90 capsule 0  . REXULTI 2 MG TABS Take 2 mg by mouth at bedtime.   1  . rosuvastatin (CRESTOR) 5 MG tablet Take 1 tablet (5 mg total) by mouth daily. 90 tablet 3   No current facility-administered  medications for this visit.    OBJECTIVE:   Vitals:   08/10/19 1307  BP: 122/66  Pulse: 97  Resp: 17  Temp: 98 F (36.7 C)  SpO2: 97%   Wt Readings from Last 3 Encounters:  08/10/19 186 lb 14.4 oz (84.8 kg)  08/03/19 185 lb 14.4 oz (84.3 kg)  07/13/19 185 lb 12.8 oz (84.3 kg)   Body mass index is 31.1 kg/m.  ECOG FS:1 - Symptomatic but completely ambulatory GENERAL: Patient is a thin older woman examined in wheelchair in no apparent distress HEENT:  Sclerae anicteric.  Mask in place. Neck is supple.  NODES:  No cervical, supraclavicular, or axillary lymphadenopathy palpated.  LUNGS:  Clear to auscultation bilaterally.  No wheezes or rhonchi. HEART:  Regular rate and rhythm, occasional irregular beat. No murmur appreciated. ABDOMEN:  Soft, nontender.  Positive, normoactive bowel sounds. MSK:  No focal spinal tenderness to palpation.  EXTREMITIES:  No peripheral edema.   SKIN:  Clear with no obvious rashes or skin changes. No nail dyscrasia. NEURO:  Nonfocal. Well oriented.  Appropriate affect.    LAB RESULTS:  Lab Results  Component Value Date   WBC 6.5 08/10/2019   NEUTROABS 4.4 08/10/2019   HGB 12.4 08/10/2019   HCT 39.8 08/10/2019   MCV 86.5 08/10/2019   PLT 218 08/10/2019      Chemistry      Component Value Date/Time   NA 140 08/10/2019 1226   NA 140 03/25/2017 1300   K 4.2 08/10/2019 1226   K 4.2 03/25/2017 1300   CL 108 08/10/2019 1226   CO2 20 (L) 08/10/2019 1226   CO2 24 03/25/2017 1300   BUN 18 08/10/2019 1226   BUN 17.9 03/25/2017 1300   CREATININE 1.12 (H) 08/10/2019 1226   CREATININE 1.12 (H) 06/03/2019 1150   CREATININE 1.1 03/25/2017 1300      Component Value Date/Time   CALCIUM 9.2 08/10/2019 1226   CALCIUM 9.4 03/25/2017 1300   ALKPHOS 131 (H) 08/10/2019 1226   ALKPHOS 102 03/25/2017 1300   AST 15 08/10/2019 1226   AST 17 06/03/2019 1150   AST 41 (H) 03/25/2017 1300   ALT 12 08/10/2019 1226   ALT 13 06/03/2019 1150   ALT 52  03/25/2017 1300   BILITOT 0.3 08/10/2019 1226   BILITOT 0.3 06/03/2019 1150   BILITOT 0.36 03/25/2017 1300      STUDIES: No results found.  No results found.    ASSESSMENT: 73 y.o. BRCA negative [place] woman  (1) status post right breast lumpectomy and axillary lymph node dissection in 1997 for a stage I breast cancer, treated with adjuvant radiation and tamoxifen for 5 years  (2) status post right breast lower outer quadrant biopsy 07/13/2013 for a clinical T1c N0, stage IA invasive ductal carcinoma, grade 2, estrogen receptor 100% positive, progesterone receptor 13% positive, with an MIB-1 of 33%, and HER-2 amplification by CISH with a HER2/CEP 17 ratio of 3.19, and an average HER-2 copy number per cell of 4.15  (3) status post right mastectomy 07/28/2013 for a pT1c pN0, stage IA invasive ductal carcinoma, grade 3, with close but negative margins. Prognostic panel was not repeated  (a) the patient met with Dr. Harlow Mares and has decided against reconstruction  (4) completed weekly paclitaxel x12 12/06/2913, with trastuzumab/ pertuzumab every 3 weeks; pertuzumab was held with third dose on 11/01/2013 due to diarrhea, tried a half dose on cycle 4 again with diarrhea developing  (5) trastuzumab (started 09/20/2013) continued for 1 year, last dose 09/21/2014;  (a) final echocardiogram 09/07/2014 showed an ejection fraction of 55-60%  (6) anastrozole started May 2015; completed April 2020  (a) bone density 12/15/2013 normal  (b) bone density 02/01/2016 at [facility] was normal with a T score of -1.0   OTHER PROBLEMS: (a) History of chronic lymphoid leukemia diagnosed by flow cytometry 06/29/2013, the cells being CD5, CD20 and CD23 positive, CD10 negative.   (1)  right renal mass resected on 02/05/15, consisting of atypical lymphoid proliferation composed of monotonous small lymphocytes   (2) Anemia with a normal MCV and normal ferritin-- B-12 and folate normal; stable  (3)  ibrutinib 280  milligrams daily started 08/01/202020, discontinued 06/03/2019 because of concerns that the patient was confused and not taking her medication appropriately  (4) rituximab weekly x8 started 06/15/2019    PLAN: Fani is doing well today.  Her labs are stable and she looks much improved as compared to when I saw her last week.  Although her pain is still present from her fall at the beach, her blood pressure is improved and she has not had any further falls.  I fully support and agree with Dr. Pennie Banter PT recommendation, and let Thelda know that I think it would be good for her.    Marcie Bal and I reviewed her lab work in detail.  She has no lymphadenopathy and her WBC is normal.  She continues to tolreate Rituximab well and will continue this weekly to complete 8 doses.  I reviewed her upcoming schedule with her.  I gave her each date to make sure that it would work for her, and she agrees that it does.    Yailene will undergo brain MRI tomorrow at [facility].  I will see her back for labs, f/u, and her 7th Rituximab.    Legacie was recommended to continue with the appropriate pandemic precautions. She knows to call for any questions that may arise between now and her next appointment.  We are happy to see her sooner if needed.  A total of (15) minutes of face-to-face time was spent with this patient with greater than 50% of that time in counseling and care-coordination.  Wilber Bihari, NP  08/10/19 1:31 PM Medical Oncology and Hematology [facility] [address], [place] [address] Tel. [phone]    Fax. [phone]

## 2019-08-11 ENCOUNTER — Telehealth: Payer: Self-pay

## 2019-08-11 ENCOUNTER — Ambulatory Visit
Admission: RE | Admit: 2019-08-11 | Discharge: 2019-08-11 | Disposition: A | Discharge status: Home / Self Care | Payer: Medicare Other | Source: Ambulatory Visit | Attending: Adult Health | Admitting: Adult Health

## 2019-08-11 ENCOUNTER — Ambulatory Visit: Payer: Medicare Other

## 2019-08-11 DIAGNOSIS — C9110 Chronic lymphocytic leukemia of B-cell type not having achieved remission: Secondary | ICD-10-CM | POA: Diagnosis not present

## 2019-08-11 DIAGNOSIS — C50919 Malignant neoplasm of unspecified site of unspecified female breast: Secondary | ICD-10-CM | POA: Diagnosis not present

## 2019-08-11 DIAGNOSIS — C50511 Malignant neoplasm of lower-outer quadrant of right female breast: Secondary | ICD-10-CM

## 2019-08-11 MED ORDER — GADOBENATE DIMEGLUMINE 529 MG/ML IV SOLN
17.0000 mL | Freq: Once | INTRAVENOUS | Status: AC | PRN
  Administered 2019-08-11: 17 mL via INTRAVENOUS

## 2019-08-11 NOTE — Telephone Encounter (Signed)
Left vm for patient to call back.  Need to inform her of normal MRI results.  Also, a copy of results were faxed to Dr. Pennie Banter office

## 2019-08-16 ENCOUNTER — Other Ambulatory Visit: Payer: Self-pay | Admitting: *Deleted

## 2019-08-16 DIAGNOSIS — C9110 Chronic lymphocytic leukemia of B-cell type not having achieved remission: Secondary | ICD-10-CM

## 2019-08-17 ENCOUNTER — Encounter: Payer: Self-pay | Admitting: Adult Health

## 2019-08-17 ENCOUNTER — Inpatient Hospital Stay: Payer: Medicare Other

## 2019-08-17 ENCOUNTER — Other Ambulatory Visit: Payer: Self-pay

## 2019-08-17 ENCOUNTER — Inpatient Hospital Stay (HOSPITAL_BASED_OUTPATIENT_CLINIC_OR_DEPARTMENT_OTHER): Payer: Medicare Other | Admitting: Adult Health

## 2019-08-17 VITALS — BP 132/60 | HR 92 | Temp 98.3°F | Resp 18 | Ht 65.0 in | Wt 188.1 lb

## 2019-08-17 VITALS — BP 105/59 | HR 86 | Temp 98.2°F | Resp 17

## 2019-08-17 DIAGNOSIS — Z833 Family history of diabetes mellitus: Secondary | ICD-10-CM | POA: Diagnosis not present

## 2019-08-17 DIAGNOSIS — Z8249 Family history of ischemic heart disease and other diseases of the circulatory system: Secondary | ICD-10-CM | POA: Diagnosis not present

## 2019-08-17 DIAGNOSIS — Z8042 Family history of malignant neoplasm of prostate: Secondary | ICD-10-CM | POA: Diagnosis not present

## 2019-08-17 DIAGNOSIS — F419 Anxiety disorder, unspecified: Secondary | ICD-10-CM | POA: Diagnosis not present

## 2019-08-17 DIAGNOSIS — Z9221 Personal history of antineoplastic chemotherapy: Secondary | ICD-10-CM | POA: Diagnosis not present

## 2019-08-17 DIAGNOSIS — K219 Gastro-esophageal reflux disease without esophagitis: Secondary | ICD-10-CM | POA: Diagnosis not present

## 2019-08-17 DIAGNOSIS — C9110 Chronic lymphocytic leukemia of B-cell type not having achieved remission: Secondary | ICD-10-CM

## 2019-08-17 DIAGNOSIS — Z79899 Other long term (current) drug therapy: Secondary | ICD-10-CM | POA: Diagnosis not present

## 2019-08-17 DIAGNOSIS — R0781 Pleurodynia: Secondary | ICD-10-CM | POA: Diagnosis not present

## 2019-08-17 DIAGNOSIS — E785 Hyperlipidemia, unspecified: Secondary | ICD-10-CM | POA: Diagnosis not present

## 2019-08-17 DIAGNOSIS — C50511 Malignant neoplasm of lower-outer quadrant of right female breast: Secondary | ICD-10-CM

## 2019-08-17 DIAGNOSIS — Z170 Estrogen receptor positive status [ER+]: Secondary | ICD-10-CM

## 2019-08-17 DIAGNOSIS — E039 Hypothyroidism, unspecified: Secondary | ICD-10-CM | POA: Diagnosis not present

## 2019-08-17 DIAGNOSIS — F329 Major depressive disorder, single episode, unspecified: Secondary | ICD-10-CM | POA: Diagnosis not present

## 2019-08-17 DIAGNOSIS — Z5112 Encounter for antineoplastic immunotherapy: Secondary | ICD-10-CM | POA: Diagnosis not present

## 2019-08-17 DIAGNOSIS — Z8262 Family history of osteoporosis: Secondary | ICD-10-CM | POA: Diagnosis not present

## 2019-08-17 LAB — CMP (CANCER CENTER ONLY)
ALT: 12 U/L (ref 0–44)
AST: 18 U/L (ref 15–41)
Albumin: 4.1 g/dL (ref 3.5–5.0)
Alkaline Phosphatase: 131 U/L — ABNORMAL HIGH (ref 38–126)
Anion gap: 12 (ref 5–15)
BUN: 16 mg/dL (ref 8–23)
CO2: 20 mmol/L — ABNORMAL LOW (ref 22–32)
Calcium: 9.1 mg/dL (ref 8.9–10.3)
Chloride: 107 mmol/L (ref 98–111)
Creatinine: 0.89 mg/dL (ref 0.44–1.00)
GFR, Est AFR Am: >60 mL/min (ref >60)
GFR, Estimated: >60 mL/min (ref >60)
Glucose, Bld: 83 mg/dL (ref 70–99)
Potassium: 4.5 mmol/L (ref 3.5–5.1)
Sodium: 139 mmol/L (ref 135–145)
Total Bilirubin: 0.3 mg/dL (ref 0.3–1.2)
Total Protein: 6.5 g/dL (ref 6.5–8.1)

## 2019-08-17 LAB — CBC WITH DIFFERENTIAL (CANCER CENTER ONLY)
Abs Immature Granulocytes: 0.03 10*3/uL (ref 0.00–0.07)
Basophils Absolute: 0.1 10*3/uL (ref 0.0–0.1)
Basophils Relative: 1 %
Eosinophils Absolute: 0.2 10*3/uL (ref 0.0–0.5)
Eosinophils Relative: 4 %
HCT: 36.5 % (ref 36.0–46.0)
Hemoglobin: 11.4 g/dL — ABNORMAL LOW (ref 12.0–15.0)
Immature Granulocytes: 1 %
Lymphocytes Relative: 21 %
Lymphs Abs: 1.1 10*3/uL (ref 0.7–4.0)
MCH: 27 pg (ref 26.0–34.0)
MCHC: 31.2 g/dL (ref 30.0–36.0)
MCV: 86.3 fL (ref 80.0–100.0)
Monocytes Absolute: 0.5 10*3/uL (ref 0.1–1.0)
Monocytes Relative: 10 %
Neutro Abs: 3.3 10*3/uL (ref 1.7–7.7)
Neutrophils Relative %: 63 %
Platelet Count: 173 10*3/uL (ref 150–400)
RBC: 4.23 MIL/uL (ref 3.87–5.11)
RDW: 15.1 % (ref 11.5–15.5)
WBC Count: 5.2 10*3/uL (ref 4.0–10.5)
nRBC: 0 % (ref 0.0–0.2)

## 2019-08-17 LAB — LACTATE DEHYDROGENASE: LDH: 228 U/L — ABNORMAL HIGH (ref 98–192)

## 2019-08-17 MED ORDER — SODIUM CHLORIDE 0.9 % IV SOLN
Freq: Once | INTRAVENOUS | Status: AC
  Filled 2019-08-17: qty 250

## 2019-08-17 MED ORDER — ACETAMINOPHEN 325 MG PO TABS
ORAL_TABLET | ORAL | Status: AC
  Filled 2019-08-17: qty 2

## 2019-08-17 MED ORDER — DEXAMETHASONE SODIUM PHOSPHATE 10 MG/ML IJ SOLN
10.0000 mg | Freq: Once | INTRAMUSCULAR | Status: AC
  Administered 2019-08-17: 10 mg via INTRAVENOUS

## 2019-08-17 MED ORDER — ACETAMINOPHEN 325 MG PO TABS
650.0000 mg | ORAL_TABLET | Freq: Once | ORAL | Status: AC
  Administered 2019-08-17: 650 mg via ORAL

## 2019-08-17 MED ORDER — SODIUM CHLORIDE 0.9 % IV SOLN
375.0000 mg/m2 | Freq: Once | INTRAVENOUS | Status: AC
  Administered 2019-08-17: 800 mg via INTRAVENOUS
  Filled 2019-08-17: qty 50

## 2019-08-17 MED ORDER — DIPHENHYDRAMINE HCL 25 MG PO CAPS
ORAL_CAPSULE | ORAL | Status: AC
  Filled 2019-08-17: qty 1

## 2019-08-17 MED ORDER — DEXAMETHASONE SODIUM PHOSPHATE 10 MG/ML IJ SOLN
INTRAMUSCULAR | Status: AC
  Filled 2019-08-17: qty 1

## 2019-08-17 MED ORDER — DIPHENHYDRAMINE HCL 25 MG PO CAPS
25.0000 mg | ORAL_CAPSULE | Freq: Once | ORAL | Status: AC
  Administered 2019-08-17: 25 mg via ORAL

## 2019-08-17 NOTE — Patient Instructions (Signed)
[facility] Discharge Instructions for Patients Receiving Chemotherapy  Today you received the following chemotherapy agents: Rituximab   To help prevent nausea and vomiting after your treatment, we encourage you to take your nausea medication  as prescribed.    If you develop nausea and vomiting that is not controlled by your nausea medication, call the clinic.   BELOW ARE SYMPTOMS THAT SHOULD BE REPORTED IMMEDIATELY:  *FEVER GREATER THAN 100.5 F  *CHILLS WITH OR WITHOUT FEVER  NAUSEA AND VOMITING THAT IS NOT CONTROLLED WITH YOUR NAUSEA MEDICATION  *UNUSUAL SHORTNESS OF BREATH  *UNUSUAL BRUISING OR BLEEDING  TENDERNESS IN MOUTH AND THROAT WITH OR WITHOUT PRESENCE OF ULCERS  *URINARY PROBLEMS  *BOWEL PROBLEMS  UNUSUAL RASH Items with * indicate a potential emergency and should be followed up as soon as possible.  Feel free to call the clinic should you have any questions or concerns. The clinic phone number is [phone].  Please show the CHEMO ALERT CARD at check-in to the Emergency Department and triage nurse.   

## 2019-08-17 NOTE — Progress Notes (Signed)
ID: [name] [name] OB: October 22, 1945  MR#: [id number]  BJS#:[id number]  Patient Care Team: Deland Pretty, MD as PCP - General (Internal Medicine) Jerline Pain, MD as PCP - Cardiology (Cardiology) Magrinat, Virgie Dad, MD as Consulting Physician (Oncology) Milus Banister, MD as Attending Physician (Gastroenterology) Megan Salon, MD as Consulting Physician (Gynecology) Donnie Mesa, MD as Consulting Physician (General Surgery) Noemi Chapel, NP as Nurse Practitioner Rod Can, MD as Consulting Physician (Orthopedic Surgery)   CHIEF COMPLAINT: Estrogen receptor positive Right Breast Cancer; chronic lymphoid leukemia  CURRENT TREATMENT: rituximab   INTERVAL HISTORY: [name] [name] returns today for follow-up and treatment of her chronic lymphoid leukemia and estrogen receptor positive breast cancer.   She continues under observation as far as her breast cancer is concerned.  Her most recent mammogram was 04/04/2019.  It showed the breast density category B.  There was no evidence of malignancy.  She began Rituximab on 10/28, the first 100 mg and subsequent on 11/11 full dose.  She receives this weekly.  Today is week 7 of her treatment.    REVIEW OF SYSTEMS: [name] [name] is doing moderately well.  She continues to have some back pain from her fall, however her BP is improved from her visit immediately after her fall, and she has remained off of Xanax.  She is taking advil and using a heating pad to help this.  She is following up with Dr. Shelia Media about this.  She denies any new issues such as fever, chills, chest pain, palpitations, cough, shortness of breath, bowel/bladder changes, headaches, vision issues, nausea, vomiting, or any other concerns.  A detailed ROS was otherwise non contributory.    BREAST CANCER HISTORY: From the prior summary:  [name] [name] has a history of breast cancer dating back to 1997, [name] [name] Dr. Annabell Sabal performed a right lumpectomy and axillary lymph node dissection for what  according to the patient and her family was a stage I invasive ductal breast cancer. She received radiation adjuvantly and then took tamoxifen for 5 years  In February of 2014 she had a normal screening mammography, but in November 2014 she felt a "hard lump" in her right breast. She brought this to the attention of her urologist, Dr. Izora Gala, and he set her up for bilateral diagnostic mammography with mammography at [facility] 07/13/2013. This showed her breast density to be category B. There was no mammographic abnormality noted but ultrasonography showed a 1.4 cm irregularly shaped solid mass in the right breast at the 8:00 position. This was biopsied the same day, and showed ([id number] an invasive ductal carcinoma, grade 2, estrogen receptor 100% positive, progesterone receptor 13% positive, with an MIB-1 of 33% and HER-2 amplification with a HER-2: CEP 17 ratio of 3.19, and a HER-2 copy number percent all of 4.15.  The patient's subsequent history is as detailed below     PAST MEDICAL HISTORY: Past Medical History:  Diagnosis Date  . Alopecia   . Anxiety   . Arthritis    "spine" (07/28/2013)  . Breast cancer ([place]) 1997; 2014   right  . CAP (community acquired pneumonia)    admission 06-04-2013, failed outpatient  . Chronic back pain    "lower back and upper neck" (07/28/2013)  . CLL (chronic lymphocytic leukemia) ([place]) 05/2013  . DDD (degenerative disc disease)   . 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 ear   . Depression   . Diverticulosis   . Elevated LFTs   . Fibromyalgia 1978  . GERD (gastroesophageal reflux disease)   .  Hematuria   . History of syncope    episode 2008--  no recurrence since  . HLD (hyperlipidemia)   . Hypothyroidism   . Kidney stones 2014  . Plantar fasciitis   . Ruptured disk    one in neck and two in back  . S/P chemotherapy, time since greater than 12 weeks   . Sinus tachycardia    mild resting  . Skin cancer   . Stool incontinence    "at times recently"     PAST  SURGICAL HISTORY: Past Surgical History:  Procedure Laterality Date  . ANTERIOR LATERAL LUMBAR FUSION 4 LEVELS Left 04/25/2016   Procedure: LEFT LUMBAR ONE-TWO, LUMBAR TWO-THREE, LUMBAR THREE-FOUR, LUMBAR FOUR-FIVE ANTERIOR LATERAL LUMBAR FUSION;  Surgeon: Earnie Larsson, MD;  Location: [place] [facility];  Service: Neurosurgery;  Laterality: Left;  . APPENDECTOMY  1997  . APPLICATION OF ROBOTIC ASSISTANCE FOR SPINAL PROCEDURE N/A 04/06/2017   Procedure: APPLICATION OF ROBOTIC ASSISTANCE FOR SPINAL PROCEDURE;  Surgeon: Earnie Larsson, MD;  Location: [place];  Service: Neurosurgery;  Laterality: N/A;  . BREAST BIOPSY Right 2014  . BREAST LUMPECTOMY Right 1997  . BREAST LUMPECTOMY WITH AXILLARY LYMPH NODE DISSECTION Right 1997  . CATARACT EXTRACTION, BILATERAL  2019   Dr. Kathlen Mody  . CYSTOSCOPY WITH RETROGRADE PYELOGRAM, URETEROSCOPY AND STENT PLACEMENT Bilateral 06/17/2013   Procedure: CYSTOSCOPY WITH RETROGRADE PYELOGRAM, URETEROSCOPY AND LEFT DOUBLE  J STENT PLACEMENT RIGHT URETERAL HOLMIIUM LASER AND DOUBLE J STENT ;  Surgeon: Hanley Ben, MD;  Location: [place];  Service: Urology;  Laterality: Bilateral;  . HOLMIUM LASER APPLICATION Bilateral 16/05/9603   Procedure: HOLMIUM LASER APPLICATION;  Surgeon: Hanley Ben, MD;  Location: [place];  Service: Urology;  Laterality: Bilateral;  . LITHOTRIPSY Left 06/2013  . LUMBAR EPIDURAL INJECTION     has had 7 injections  . PORT-A-CATH REMOVAL Left 10/03/2014   Procedure: REMOVAL PORT-A-CATH;  Surgeon: Donnie Mesa, MD;  Location: [place];  Service: General;  Laterality: Left;  . PORTACATH PLACEMENT Left 07/28/2013   Procedure: ATTEMPTED INSERTION PORT-A-CATH;  Surgeon: Imogene Burn. Georgette Dover, MD;  Location: [place];  Service: General;  Laterality: Left;  . POSTERIOR LUMBAR FUSION 4 LEVEL N/A 04/25/2016   Procedure: LUMBAR FIVE-SACRAL ONE POSTERIOR LUMBAR INTERBODY FUSION, THORACIC NINE-SACRAL ONE POSTERIOR LATERAL  ARTHRODESIS WITH PEDICLE SCREWS;  Surgeon: Earnie Larsson, MD;  Location: [place] [facility];  Service: Neurosurgery;  Laterality: N/A;  . RETINAL DETACHMENT SURGERY Right 2013  . ROBOTIC ASSITED PARTIAL NEPHRECTOMY Right 02/05/2015   Procedure: ROBOTIC ASSITED PARTIAL NEPHRECTOMY;  Surgeon: Raynelle Bring, MD;  Location: [facility];  Service: Urology;  Laterality: Right;  . SIMPLE MASTECTOMY WITH AXILLARY SENTINEL NODE BIOPSY Right 07/28/2013   Procedure: RIGHT TOTAL  MASTECTOMY;  Surgeon: Imogene Burn. Georgette Dover, MD;  Location: [place];  Service: General;  Laterality: Right;  . TONSILLECTOMY  AGE 84  . TOTAL ABDOMINAL HYSTERECTOMY W/ BILATERAL SALPINGOOPHORECTOMY  1997    FAMILY HISTORY Family History  Problem Relation Age of Onset  . Heart disease Maternal Grandfather   . Diabetes Maternal Grandfather   . Colon cancer Maternal Aunt 79  . Brain cancer Paternal Grandmother        dx in 12s  . Dementia Mother   . Diabetes Mother   . Osteoporosis Mother   . Diabetes Maternal Aunt   . Prostate cancer Father 68  . Bipolar disorder Maternal Aunt   . Stroke Maternal Aunt   . Stomach cancer Paternal Uncle  dx in late 73s   the patient's mother died at the age of 59. The patient's father is alive at age 69. She had no brothers or sisters. Her father has a history of prostate cancer. There is no history of breast or ovarian cancer in the family. One maternal first cousin has a history of Hodgkin's lymphoma.   GYNECOLOGIC HISTORY:  Menarche age 43, first live birth age 22. She is GX P1. She underwent total abdominal hysterectomy and bilateral salpingo-oophorectomy in 1997 she did not take hormone replacement.   SOCIAL HISTORY: (Updated  11/15/2013)  She is a homemaker. Her husband Arnette Norris farms approximately 500 acres, including quite a bit of tobacco. Daughter Colletta [place] lives in [place] where she works as a Pension scheme manager for a drug company. The patient has no grandchildren. She has  two "grand cats". She attends a local united church of [place]: Not in place   HEALTH MAINTENANCE:  Social History   Tobacco Use  . Smoking status: Never Smoker  . Smokeless tobacco: Never Used  Substance Use Topics  . Alcohol use: No    Comment: 07/28/2013 "no alcohol since 1994; never had problem w/it"  . Drug use: No     Colonoscopy: 2009/ [facility]  PAP: Status post hysterectomy  Bone density: May 10/14/2009 [facility] was normal  Lipid panel:  Not on file   Allergies  Allergen Reactions  . Lasix [Furosemide] Nausea And Vomiting and Other (See Comments)    HEADACHE  . Lithium Other (See Comments)    Dizziness, "cause me to fall"  . Sulfa Antibiotics Hives  . Trazodone And Nefazodone Other (See Comments)    insomnia  . Lyrica [Pregabalin] Diarrhea, Nausea Only and Rash    Current Outpatient Medications  Medication Sig Dispense Refill  . allopurinol (ZYLOPRIM) 300 MG tablet Take 1 tablet (300 mg total) by mouth daily. 90 tablet 4  . amitriptyline (ELAVIL) 150 MG tablet Take 150 mg by mouth at bedtime.    . DULoxetine (CYMBALTA) 30 MG capsule Take 2 capsules (60 mg total) by mouth daily after breakfast.    . levothyroxine (SYNTHROID, LEVOTHROID) 88 MCG tablet Take 88 mcg by mouth daily before breakfast.   4  . metoprolol tartrate (LOPRESSOR) 100 MG tablet Take 1 tablet (100 mg) by mouth once 2 hours prior to procedure 1 tablet 0  . omeprazole (PRILOSEC) 40 MG capsule TAKE 1 CAPSULE(40 MG) BY MOUTH DAILY AFTER BREAKFAST 90 capsule 0  . REXULTI 2 MG TABS Take 2 mg by mouth at bedtime.   1  . rosuvastatin (CRESTOR) 5 MG tablet Take 1 tablet (5 mg total) by mouth daily. 90 tablet 3   No current facility-administered medications for this visit.    OBJECTIVE:   Vitals:   08/17/19 1124  BP: 132/60  Pulse: 92  Resp: 18  Temp: 98.3 F (36.8 C)  SpO2: 100%   Wt Readings from Last 3 Encounters:  08/17/19 188 lb 1.6 oz (85.3 kg)  08/10/19 186 lb 14.4  oz (84.8 kg)  08/03/19 185 lb 14.4 oz (84.3 kg)   Body mass index is 31.3 kg/m.    ECOG FS:1 - Symptomatic but completely ambulatory GENERAL: Patient is a thin older woman examined in chair in no apparent distress HEENT:  Sclerae anicteric.  Mask in place. Neck is supple.  NODES:  No cervical, supraclavicular, or axillary lymphadenopathy palpated.  LUNGS:  Clear to auscultation bilaterally.  No wheezes or rhonchi. HEART:  Regular rate and rhythm, occasional irregular beat. No murmur appreciated. ABDOMEN:  Soft, nontender.  Positive, normoactive bowel sounds. MSK:  No focal spinal tenderness to palpation.  EXTREMITIES:  No peripheral edema.   SKIN:  Clear with no obvious rashes or skin changes. No nail dyscrasia. NEURO:  Nonfocal. Well oriented.  Appropriate affect.    LAB RESULTS:  Lab Results  Component Value Date   WBC 5.2 08/17/2019   NEUTROABS 3.3 08/17/2019   HGB 11.4 (L) 08/17/2019   HCT 36.5 08/17/2019   MCV 86.3 08/17/2019   PLT 173 08/17/2019      Chemistry      Component Value Date/Time   NA 140 08/10/2019 1226   NA 140 03/25/2017 1300   K 4.2 08/10/2019 1226   K 4.2 03/25/2017 1300   CL 108 08/10/2019 1226   CO2 20 (L) 08/10/2019 1226   CO2 24 03/25/2017 1300   BUN 18 08/10/2019 1226   BUN 17.9 03/25/2017 1300   CREATININE 1.12 (H) 08/10/2019 1226   CREATININE 1.12 (H) 06/03/2019 1150   CREATININE 1.1 03/25/2017 1300      Component Value Date/Time   CALCIUM 9.2 08/10/2019 1226   CALCIUM 9.4 03/25/2017 1300   ALKPHOS 131 (H) 08/10/2019 1226   ALKPHOS 102 03/25/2017 1300   AST 15 08/10/2019 1226   AST 17 06/03/2019 1150   AST 41 (H) 03/25/2017 1300   ALT 12 08/10/2019 1226   ALT 13 06/03/2019 1150   ALT 52 03/25/2017 1300   BILITOT 0.3 08/10/2019 1226   BILITOT 0.3 06/03/2019 1150   BILITOT 0.36 03/25/2017 1300      STUDIES: MR Brain W Wo Contrast  Result Date: 08/11/2019 CLINICAL DATA:  Invasive breast cancer restaging.  CLL. EXAM: MRI  HEAD WITHOUT AND WITH CONTRAST TECHNIQUE: Multiplanar, multiecho pulse sequences of the brain and surrounding structures were obtained without and with intravenous contrast. CONTRAST:  69m MULTIHANCE GADOBENATE DIMEGLUMINE 529 MG/ML IV SOLN COMPARISON:  MRI head 02/25/2012 FINDINGS: Brain: Mild cortical atrophy. Negative for hydrocephalus. Negative for acute infarct. Few small white matter hyperintensities bilaterally appear chronic. Chronic ischemia in the thalamus bilaterally. Negative for hemorrhage or mass No enhancing lesions identified. Vascular: Normal arterial flow voids Skull and upper cervical spine: Negative Sinuses/Orbits: Paranasal sinuses clear.  Bilateral cataract surgery Other: None IMPRESSION: No acute abnormality and negative for metastatic disease to the brain. Electronically Signed   By: CFranchot GalloM.D.   On: 08/11/2019 15:01    MR Brain W Wo Contrast  Result Date: 08/11/2019 CLINICAL DATA:  Invasive breast cancer restaging.  CLL. EXAM: MRI HEAD WITHOUT AND WITH CONTRAST TECHNIQUE: Multiplanar, multiecho pulse sequences of the brain and surrounding structures were obtained without and with intravenous contrast. CONTRAST:  165mMULTIHANCE GADOBENATE DIMEGLUMINE 529 MG/ML IV SOLN COMPARISON:  MRI head 02/25/2012 FINDINGS: Brain: Mild cortical atrophy. Negative for hydrocephalus. Negative for acute infarct. Few small white matter hyperintensities bilaterally appear chronic. Chronic ischemia in the thalamus bilaterally. Negative for hemorrhage or mass No enhancing lesions identified. Vascular: Normal arterial flow voids Skull and upper cervical spine: Negative Sinuses/Orbits: Paranasal sinuses clear.  Bilateral cataract surgery Other: None IMPRESSION: No acute abnormality and negative for metastatic disease to the brain. Electronically Signed   By: ChFranchot Gallo.D.   On: 08/11/2019 15:01      ASSESSMENT: [id number].o. BRCA negative [name] [name] woman  (1) status post right breast  lumpectomy and axillary lymph node dissection in 1997 for a stage I breast cancer,  treated with adjuvant radiation and tamoxifen for 5 years  (2) status post right breast lower outer quadrant biopsy 07/13/2013 for a clinical T1c N0, stage IA invasive ductal carcinoma, grade 2, estrogen receptor 100% positive, progesterone receptor 13% positive, with an MIB-1 of 33%, and HER-2 amplification by CISH with a HER2/CEP 17 ratio of 3.19, and an average HER-2 copy number per cell of 4.15  (3) status post right mastectomy 07/28/2013 for a pT1c pN0, stage IA invasive ductal carcinoma, grade 3, with close but negative margins. Prognostic panel was not repeated  (a) the patient met with Dr. Harlow Mares and has decided against reconstruction  (4) completed weekly paclitaxel x12 12/06/2913, with trastuzumab/ pertuzumab every 3 weeks; pertuzumab was held with third dose on 11/01/2013 due to diarrhea, tried a half dose on cycle 4 again with diarrhea developing  (5) trastuzumab (started 09/20/2013) continued for 1 year, last dose 09/21/2014;  (a) final echocardiogram 09/07/2014 showed an ejection fraction of 55-60%  (6) anastrozole started May 2015; completed April 2020  (a) bone density 12/15/2013 normal  (b) bone density 02/01/2016 at [facility] was normal with a T score of -1.0   OTHER PROBLEMS: (a) History of chronic lymphoid leukemia diagnosed by flow cytometry 06/29/2013, the cells being CD5, CD20 and CD23 positive, CD10 negative.   (1) right renal mass resected on 02/05/15, consisting of atypical lymphoid proliferation composed of monotonous small lymphocytes   (2) Anemia with a normal MCV and normal ferritin-- B-12 and folate normal; stable  (3)  ibrutinib 280 milligrams daily started 08/01/202020, discontinued 06/03/2019 because of concerns that the patient was confused and not taking her medication appropriately  (4) rituximab weekly x8 started 06/15/2019    PLAN: [name] [name] is improved today.  She is not in a  wheelchair, and her strength is increasing.  Her blood pressure is also improving since she stopped taking Xanax.  She continues to f/u with Dr. Shelia Media about her recent falls and pain associated with her falls.  [name] [name]'s CLL is doing well.  Her WBC remains normal and at 5.  She will continue with weekly Rituximab.  Today is week 7, and next week she will complete week 8.  She is considering getting a port because of the sticks she has had to undergo.  I recommended that she talk to Dr. Jana Hakim about this further next week and ascertain the frequency she will need Rituximab after next weeks treatment and then decide from there.  She agrees with this.  [name] [name] will return in one week for labs, f/u, and her next treatment.    [name] [name] was recommended to continue with the appropriate pandemic precautions. She knows to call for any questions that may arise between now and her next appointment.  We are happy to see her sooner if needed.  A total of (15) minutes of face-to-face time was spent with this patient with greater than 50% of that time in counseling and care-coordination.  Wilber Bihari, NP  08/17/19 11:41 AM Medical Oncology and Hematology [facility] [place], [place] [address] Tel. [phone]    Fax. [phone]

## 2019-08-23 NOTE — Progress Notes (Signed)
ID: [name] [name] OB: December 11, 1945  MR#: [id number]  AUQ#:[id number]  Patient Care Team: Deland Pretty, MD as PCP - General (Internal Medicine) Jerline Pain, MD as PCP - Cardiology (Cardiology) Carnisha Feltz, Virgie Dad, MD as Consulting Physician (Oncology) Milus Banister, MD as Attending Physician (Gastroenterology) Megan Salon, MD as Consulting Physician (Gynecology) Donnie Mesa, MD as Consulting Physician (General Surgery) Noemi Chapel, NP as Nurse Practitioner Rod Can, MD as Consulting Physician (Orthopedic Surgery)   CHIEF COMPLAINT: Estrogen receptor positive Right Breast Cancer; chronic lymphoid leukemia  CURRENT TREATMENT: rituximab   INTERVAL HISTORY: Tamre returns today for follow-up and treatment of her chronic lymphoid leukemia and more remote estrogen receptor positive breast cancer.   Her most recent mammogram was 04/04/2019.  It showed the breast density category B.  There was no evidence of malignancy.  She started rituximab for her chronic lymphoid leukemia on 10/28, the first dose just 100 mg and subsequent on 11/11 full dose.  She receives this weekly.  Because of the holidays however her treatment this week has been delayed until 08/27/71   REVIEW OF SYSTEMS: Geovanna has tolerated rituximab well.  She has had no side effects specific to that medication that she is aware of.  On the other hand access has become more of an issue.  She says it takes the nurses longer to find a vein and frequently she gets bruised up  She was considering a port but after today's dose she will not receive Rituxan except every 8 weeks so I think we do not really need that.  She had a quiet Christmas with family.  She is keeping appropriate pandemic precautions.  She knows to call for any other issue that may develop before the next visit.     BREAST CANCER HISTORY: From the prior summary:  Nickola has a history of breast cancer dating back to 1997, [name]  performed a right lumpectomy and axillary lymph node dissection for what according to the patient and her family was a stage I invasive ductal breast cancer. She received radiation adjuvantly and then took tamoxifen for 5 years  In February of 2014 she had a normal screening mammography, but in November 2014 she felt a "hard lump" in her right breast. She brought this to the attention of her urologist, Dr. Izora Gala, and he set her up for bilateral diagnostic mammography with mammography at [facility] 07/13/2013. This showed her breast density to be category B. There was no mammographic abnormality noted but ultrasonography showed a 1.4 cm irregularly shaped solid mass in the right breast at the 8:00 position. This was biopsied the same day, and showed ([id number]) an invasive ductal carcinoma, grade 2, estrogen receptor 100% positive, progesterone receptor 13% positive, with an MIB-1 of 33% and HER-2 amplification with a HER-2: CEP 17 ratio of 3.19, and a HER-2 copy number percent all of 4.15.  The patient's subsequent history is as detailed below     PAST MEDICAL HISTORY: Past Medical History:  Diagnosis Date  . Alopecia   . Anxiety   . Arthritis    "spine" (07/28/2013)  . Breast cancer ([place]) 1997; 2014   right  . CAP (community acquired pneumonia)    admission 06-04-2013, failed outpatient  . Chronic back pain    "lower back and upper neck" (07/28/2013)  . CLL (chronic lymphocytic leukemia) ([place]) 05/2013  . DDD (degenerative disc disease)   . 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 ear   . Depression   .  Diverticulosis   . Elevated LFTs   . Fibromyalgia 1978  . GERD (gastroesophageal reflux disease)   . Hematuria   . History of syncope    episode 2008--  no recurrence since  . HLD (hyperlipidemia)   . Hypothyroidism   . Kidney stones 2014  . Plantar fasciitis   . Ruptured disk    one in neck and two in back  . S/P chemotherapy, time since greater than 12 weeks   . Sinus tachycardia    mild resting  .  Skin cancer   . Stool incontinence    "at times recently"     PAST SURGICAL HISTORY: Past Surgical History:  Procedure Laterality Date  . ANTERIOR LATERAL LUMBAR FUSION 4 LEVELS Left 04/25/2016   Procedure: LEFT LUMBAR ONE-TWO, LUMBAR TWO-THREE, LUMBAR THREE-FOUR, LUMBAR FOUR-FIVE ANTERIOR LATERAL LUMBAR FUSION;  Surgeon: Earnie Larsson, MD;  Location: [place] [facility];  Service: Neurosurgery;  Laterality: Left;  . APPENDECTOMY  1997  . APPLICATION OF ROBOTIC ASSISTANCE FOR SPINAL PROCEDURE N/A 04/06/2017   Procedure: APPLICATION OF ROBOTIC ASSISTANCE FOR SPINAL PROCEDURE;  Surgeon: Earnie Larsson, MD;  Location: [facility];  Service: Neurosurgery;  Laterality: N/A;  . BREAST BIOPSY Right 2014  . BREAST LUMPECTOMY Right 1997  . BREAST LUMPECTOMY WITH AXILLARY LYMPH NODE DISSECTION Right 1997  . CATARACT EXTRACTION, BILATERAL  2019   Dr. Kathlen Mody  . CYSTOSCOPY WITH RETROGRADE PYELOGRAM, URETEROSCOPY AND STENT PLACEMENT Bilateral 06/17/2013   Procedure: CYSTOSCOPY WITH RETROGRADE PYELOGRAM, URETEROSCOPY AND LEFT DOUBLE  J STENT PLACEMENT RIGHT URETERAL HOLMIIUM LASER AND DOUBLE J STENT ;  Surgeon: Hanley Ben, MD;  Location: [facility];  Service: Urology;  Laterality: Bilateral;  . HOLMIUM LASER APPLICATION Bilateral 99/24/2683   Procedure: HOLMIUM LASER APPLICATION;  Surgeon: Hanley Ben, MD;  Location: [facility];  Service: Urology;  Laterality: Bilateral;  . LITHOTRIPSY Left 06/2013  . LUMBAR EPIDURAL INJECTION     has had 7 injections  . PORT-A-CATH REMOVAL Left 10/03/2014   Procedure: REMOVAL PORT-A-CATH;  Surgeon: Donnie Mesa, MD;  Location: [facility];  Service: General;  Laterality: Left;  . PORTACATH PLACEMENT Left 07/28/2013   Procedure: ATTEMPTED INSERTION PORT-A-CATH;  Surgeon: Imogene Burn. Georgette Dover, MD;  Location: [facility];  Service: General;  Laterality: Left;  . POSTERIOR LUMBAR FUSION 4 LEVEL N/A 04/25/2016   Procedure: LUMBAR FIVE-SACRAL ONE  POSTERIOR LUMBAR INTERBODY FUSION, THORACIC NINE-SACRAL ONE POSTERIOR LATERAL ARTHRODESIS WITH PEDICLE SCREWS;  Surgeon: Earnie Larsson, MD;  Location: [place] [facility];  Service: Neurosurgery;  Laterality: N/A;  . RETINAL DETACHMENT SURGERY Right 2013  . ROBOTIC ASSITED PARTIAL NEPHRECTOMY Right 02/05/2015   Procedure: ROBOTIC ASSITED PARTIAL NEPHRECTOMY;  Surgeon: Raynelle Bring, MD;  Location: [facility];  Service: Urology;  Laterality: Right;  . SIMPLE MASTECTOMY WITH AXILLARY SENTINEL NODE BIOPSY Right 07/28/2013   Procedure: RIGHT TOTAL  MASTECTOMY;  Surgeon: Imogene Burn. Georgette Dover, MD;  Location: [facility];  Service: General;  Laterality: Right;  . TONSILLECTOMY  AGE 59  . TOTAL ABDOMINAL HYSTERECTOMY W/ BILATERAL SALPINGOOPHORECTOMY  1997    FAMILY HISTORY Family History  Problem Relation Age of Onset  . Heart disease Maternal Grandfather   . Diabetes Maternal Grandfather   . Colon cancer Maternal Aunt 79  . Brain cancer Paternal Grandmother        dx in 58s  . Dementia Mother   . Diabetes Mother   . Osteoporosis Mother   . Diabetes Maternal Aunt   . Prostate cancer Father  66  . Bipolar disorder Maternal Aunt   . Stroke Maternal Aunt   . Stomach cancer Paternal Uncle        dx in late 77s   the patient's mother died at the age of 7. The patient's father is alive at age 49. She had no brothers or sisters. Her father has a history of prostate cancer. There is no history of breast or ovarian cancer in the family. One maternal first cousin has a history of Hodgkin's lymphoma.   GYNECOLOGIC HISTORY:  Menarche age 46, first live birth age 24. She is GX P1. She underwent total abdominal hysterectomy and bilateral salpingo-oophorectomy in 1997 she did not take hormone replacement.   SOCIAL HISTORY: (Updated  11/15/2013)  She is a homemaker. Her husband Arnette Norris farms approximately 500 acres, including quite a bit of tobacco. 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 The patient has no grandchildren. She has two "grand cats". She attends a local united church of [place]: Not in place   HEALTH MAINTENANCE:  Social History   Tobacco Use  . Smoking status: Never Smoker  . Smokeless tobacco: Never Used  Substance Use Topics  . Alcohol use: No    Comment: 07/28/2013 "no alcohol since 1994; never had problem w/it"  . Drug use: No     Colonoscopy: 2009/ [facility]  PAP: Status post hysterectomy  Bone density: May 10/14/2009 at [facility] was normal  Lipid panel:  Not on file   Allergies  Allergen Reactions  . Lasix [Furosemide] Nausea And Vomiting and Other (See Comments)    HEADACHE  . Lithium Other (See Comments)    Dizziness, "cause me to fall"  . Sulfa Antibiotics Hives  . Trazodone And Nefazodone Other (See Comments)    insomnia  . Lyrica [Pregabalin] Diarrhea, Nausea Only and Rash    Current Outpatient Medications  Medication Sig Dispense Refill  . allopurinol (ZYLOPRIM) 300 MG tablet Take 1 tablet (300 mg total) by mouth daily. 90 tablet 4  . amitriptyline (ELAVIL) 150 MG tablet Take 150 mg by mouth at bedtime.    . DULoxetine (CYMBALTA) 30 MG capsule Take 2 capsules (60 mg total) by mouth daily after breakfast.    . levothyroxine (SYNTHROID, LEVOTHROID) 88 MCG tablet Take 88 mcg by mouth daily before breakfast.   4  . metoprolol tartrate (LOPRESSOR) 100 MG tablet Take 1 tablet (100 mg) by mouth once 2 hours prior to procedure 1 tablet 0  . omeprazole (PRILOSEC) 40 MG capsule TAKE 1 CAPSULE(40 MG) BY MOUTH DAILY AFTER BREAKFAST 90 capsule 0  . REXULTI 2 MG TABS Take 2 mg by mouth at bedtime.   1  . rosuvastatin (CRESTOR) 5 MG tablet Take 1 tablet (5 mg total) by mouth daily. 90 tablet 3   No current facility-administered medications for this visit.    OBJECTIVE: Older white woman in no acute distress  Vitals:   08/24/19 1525  BP: 119/68  Pulse: 98  Resp: 18  Temp: 98  F (36.7 C)  SpO2: 99%   Wt Readings from Last 3 Encounters:  08/24/19 187 lb (84.8 kg)  08/17/19 188 lb 1.6 oz (85.3 kg)  08/10/19 186 lb 14.4 oz (84.8 kg)   Body mass index is 31.12 kg/m.    ECOG FS:1 - Symptomatic but completely ambulatory  Sclerae unicteric, EOMs intact Wearing a mask No cervical or supraclavicular adenopathy Lungs no rales or  rhonchi Heart regular rate and rhythm Abd soft, nontender, positive bowel sounds, no inguinal adenopathy MSK no focal spinal tenderness, no upper extremity lymphedema Neuro: nonfocal, well oriented, appropriate affect Breasts: Status post right mastectomy with no evidence of chest wall recurrence.  Left breast is benign.  Both axillae are benign.   LAB RESULTS:  Lab Results  Component Value Date   WBC 7.1 08/24/2019   NEUTROABS 4.5 08/24/2019   HGB 12.6 08/24/2019   HCT 40.5 08/24/2019   MCV 87.5 08/24/2019   PLT 252 08/24/2019      Chemistry      Component Value Date/Time   NA 139 08/17/2019 1052   NA 140 03/25/2017 1300   K 4.5 08/17/2019 1052   K 4.2 03/25/2017 1300   CL 107 08/17/2019 1052   CO2 20 (L) 08/17/2019 1052   CO2 24 03/25/2017 1300   BUN 16 08/17/2019 1052   BUN 17.9 03/25/2017 1300   CREATININE 0.89 08/17/2019 1052   CREATININE 1.1 03/25/2017 1300      Component Value Date/Time   CALCIUM 9.1 08/17/2019 1052   CALCIUM 9.4 03/25/2017 1300   ALKPHOS 131 (H) 08/17/2019 1052   ALKPHOS 102 03/25/2017 1300   AST 18 08/17/2019 1052   AST 41 (H) 03/25/2017 1300   ALT 12 08/17/2019 1052   ALT 52 03/25/2017 1300   BILITOT 0.3 08/17/2019 1052   BILITOT 0.36 03/25/2017 1300      STUDIES: MR Brain W Wo Contrast  Result Date: 08/11/2019 CLINICAL DATA:  Invasive breast cancer restaging.  CLL. EXAM: MRI HEAD WITHOUT AND WITH CONTRAST TECHNIQUE: Multiplanar, multiecho pulse sequences of the brain and surrounding structures were obtained without and with intravenous contrast. CONTRAST:  8m MULTIHANCE  GADOBENATE DIMEGLUMINE 529 MG/ML IV SOLN COMPARISON:  MRI head 02/25/2012 FINDINGS: Brain: Mild cortical atrophy. Negative for hydrocephalus. Negative for acute infarct. Few small white matter hyperintensities bilaterally appear chronic. Chronic ischemia in the thalamus bilaterally. Negative for hemorrhage or mass No enhancing lesions identified. Vascular: Normal arterial flow voids Skull and upper cervical spine: Negative Sinuses/Orbits: Paranasal sinuses clear.  Bilateral cataract surgery Other: None IMPRESSION: No acute abnormality and negative for metastatic disease to the brain. Electronically Signed   By: CFranchot GalloM.D.   On: 08/11/2019 15:01    MR Brain W Wo Contrast  Result Date: 08/11/2019 CLINICAL DATA:  Invasive breast cancer restaging.  CLL. EXAM: MRI HEAD WITHOUT AND WITH CONTRAST TECHNIQUE: Multiplanar, multiecho pulse sequences of the brain and surrounding structures were obtained without and with intravenous contrast. CONTRAST:  179mMULTIHANCE GADOBENATE DIMEGLUMINE 529 MG/ML IV SOLN COMPARISON:  MRI head 02/25/2012 FINDINGS: Brain: Mild cortical atrophy. Negative for hydrocephalus. Negative for acute infarct. Few small white matter hyperintensities bilaterally appear chronic. Chronic ischemia in the thalamus bilaterally. Negative for hemorrhage or mass No enhancing lesions identified. Vascular: Normal arterial flow voids Skull and upper cervical spine: Negative Sinuses/Orbits: Paranasal sinuses clear.  Bilateral cataract surgery Other: None IMPRESSION: No acute abnormality and negative for metastatic disease to the brain. Electronically Signed   By: ChFranchot Gallo.D.   On: 08/11/2019 15:01      ASSESSMENT: [id number].o. BRCA negative [place] woman  (1) status post right breast lumpectomy and axillary lymph node dissection in 1997 for a stage I breast cancer, treated with adjuvant radiation and tamoxifen for 5 years  (2) status post right breast lower outer quadrant biopsy  07/13/2013 for a clinical T1c N0, stage IA invasive ductal carcinoma, grade 2,  estrogen receptor 100% positive, progesterone receptor 13% positive, with an MIB-1 of 33%, and HER-2 amplification by CISH with a HER2/CEP 17 ratio of 3.19, and an average HER-2 copy number per cell of 4.15  (3) status post right mastectomy 07/28/2013 for a pT1c pN0, stage IA invasive ductal carcinoma, grade 3, with close but negative margins. Prognostic panel was not repeated  (a) the patient met with Dr. Harlow Mares and has decided against reconstruction  (4) completed weekly paclitaxel x12 12/06/2913, with trastuzumab/ pertuzumab every 3 weeks; pertuzumab was held with third dose on 11/01/2013 due to diarrhea, tried a half dose on cycle 4 again with diarrhea developing  (5) trastuzumab (started 09/20/2013) continued for 1 year, last dose 09/21/2014;  (a) final echocardiogram 09/07/2014 showed an ejection fraction of 55-60%  (6) anastrozole started May 2015; completed April 2020  (a) bone density 12/15/2013 normal  (b) bone density 02/01/2016 at [facility] was normal with a T score of -1.0   OTHER PROBLEMS: (a) History of chronic lymphoid leukemia diagnosed by flow cytometry 06/29/2013, the cells being CD5, CD20 and CD23 positive, CD10 negative.   (1) right renal mass resected on 02/05/15, consisting of atypical lymphoid proliferation composed of monotonous small lymphocytes   (2) Anemia with a normal MCV and normal ferritin-- B-12 and folate normal; stable  (3)  ibrutinib 280 milligrams daily started 08/01/202020, discontinued 06/03/2019 because of concerns that the patient was not taking her medication appropriately  (4) rituximab weekly x8 started 06/15/2019 completed 08/24/2019  (5) maintenance rituximab (Q 2 months) to start 10/25/2019, for 1 year    PLAN: Editha's counts are perfect and she has no "B" symptoms or other symptoms or signs of active chronic lymphoid leukemia.  This is very favorable.  After this weeks  ritoximab treatment she will start maintenance, with her next treatment the first week in March, and then every 8 weeks thereafter for 1 year.  After that assuming all continues well we will initiate observation  She is keeping appropriate pandemic precautions  Since we are no longer doing weekly treatments I do not think there is a need for a port and she is comfortable with that  She knows to call for any other issue that may develop before the next visit here.  Virgie Dad. Sherisa Gilvin, MD  08/24/19 3:49 PM Medical Oncology and Hematology [facility] [place], [place] [address] Tel. [phone]    Fax. [phone]   I, Wilburn Mylar, am acting as scribe for Dr. Virgie Dad. Latifa Noble.  I, Lurline Del MD, have reviewed the above documentation for accuracy and completeness, and I agree with the above.

## 2019-08-24 ENCOUNTER — Inpatient Hospital Stay: Payer: Medicare Other

## 2019-08-24 ENCOUNTER — Other Ambulatory Visit: Payer: Self-pay

## 2019-08-24 ENCOUNTER — Inpatient Hospital Stay (HOSPITAL_BASED_OUTPATIENT_CLINIC_OR_DEPARTMENT_OTHER): Payer: Medicare Other | Admitting: Oncology

## 2019-08-24 VITALS — BP 119/68 | HR 98 | Temp 98.0°F | Resp 18 | Ht 65.0 in | Wt 187.0 lb

## 2019-08-24 DIAGNOSIS — C50511 Malignant neoplasm of lower-outer quadrant of right female breast: Secondary | ICD-10-CM | POA: Diagnosis not present

## 2019-08-24 DIAGNOSIS — Z9221 Personal history of antineoplastic chemotherapy: Secondary | ICD-10-CM | POA: Diagnosis not present

## 2019-08-24 DIAGNOSIS — Z79899 Other long term (current) drug therapy: Secondary | ICD-10-CM | POA: Diagnosis not present

## 2019-08-24 DIAGNOSIS — Z170 Estrogen receptor positive status [ER+]: Secondary | ICD-10-CM

## 2019-08-24 DIAGNOSIS — F419 Anxiety disorder, unspecified: Secondary | ICD-10-CM | POA: Diagnosis not present

## 2019-08-24 DIAGNOSIS — R0781 Pleurodynia: Secondary | ICD-10-CM | POA: Diagnosis not present

## 2019-08-24 DIAGNOSIS — Z8262 Family history of osteoporosis: Secondary | ICD-10-CM | POA: Diagnosis not present

## 2019-08-24 DIAGNOSIS — Z8249 Family history of ischemic heart disease and other diseases of the circulatory system: Secondary | ICD-10-CM | POA: Diagnosis not present

## 2019-08-24 DIAGNOSIS — C9110 Chronic lymphocytic leukemia of B-cell type not having achieved remission: Secondary | ICD-10-CM | POA: Diagnosis not present

## 2019-08-24 DIAGNOSIS — F329 Major depressive disorder, single episode, unspecified: Secondary | ICD-10-CM | POA: Diagnosis not present

## 2019-08-24 DIAGNOSIS — Z8042 Family history of malignant neoplasm of prostate: Secondary | ICD-10-CM | POA: Diagnosis not present

## 2019-08-24 DIAGNOSIS — Z833 Family history of diabetes mellitus: Secondary | ICD-10-CM | POA: Diagnosis not present

## 2019-08-24 DIAGNOSIS — E039 Hypothyroidism, unspecified: Secondary | ICD-10-CM | POA: Diagnosis not present

## 2019-08-24 DIAGNOSIS — Z5112 Encounter for antineoplastic immunotherapy: Secondary | ICD-10-CM | POA: Diagnosis not present

## 2019-08-24 DIAGNOSIS — K219 Gastro-esophageal reflux disease without esophagitis: Secondary | ICD-10-CM | POA: Diagnosis not present

## 2019-08-24 DIAGNOSIS — E785 Hyperlipidemia, unspecified: Secondary | ICD-10-CM | POA: Diagnosis not present

## 2019-08-24 LAB — CBC WITH DIFFERENTIAL/PLATELET
Abs Immature Granulocytes: 0.03 10*3/uL (ref 0.00–0.07)
Basophils Absolute: 0.1 10*3/uL (ref 0.0–0.1)
Basophils Relative: 1 %
Eosinophils Absolute: 0.3 10*3/uL (ref 0.0–0.5)
Eosinophils Relative: 4 %
HCT: 40.5 % (ref 36.0–46.0)
Hemoglobin: 12.6 g/dL (ref 12.0–15.0)
Immature Granulocytes: 0 %
Lymphocytes Relative: 21 %
Lymphs Abs: 1.5 10*3/uL (ref 0.7–4.0)
MCH: 27.2 pg (ref 26.0–34.0)
MCHC: 31.1 g/dL (ref 30.0–36.0)
MCV: 87.5 fL (ref 80.0–100.0)
Monocytes Absolute: 0.7 10*3/uL (ref 0.1–1.0)
Monocytes Relative: 10 %
Neutro Abs: 4.5 10*3/uL (ref 1.7–7.7)
Neutrophils Relative %: 64 %
Platelets: 252 10*3/uL (ref 150–400)
RBC: 4.63 MIL/uL (ref 3.87–5.11)
RDW: 15.4 % (ref 11.5–15.5)
WBC: 7.1 10*3/uL (ref 4.0–10.5)
nRBC: 0 % (ref 0.0–0.2)

## 2019-08-24 LAB — LACTATE DEHYDROGENASE: LDH: 207 U/L — ABNORMAL HIGH (ref 98–192)

## 2019-08-25 ENCOUNTER — Telehealth: Payer: Self-pay | Admitting: Oncology

## 2019-08-25 NOTE — Telephone Encounter (Signed)
Scheduled appt per 12/30 los.  Patient will get a print out at her next scheduled appt.

## 2019-08-27 ENCOUNTER — Inpatient Hospital Stay: Payer: Medicare Other | Attending: Oncology

## 2019-08-27 ENCOUNTER — Other Ambulatory Visit: Payer: Self-pay

## 2019-08-27 ENCOUNTER — Other Ambulatory Visit: Payer: Self-pay | Admitting: Oncology

## 2019-08-27 VITALS — BP 122/59 | HR 99 | Temp 98.8°F | Resp 17

## 2019-08-27 DIAGNOSIS — Z5112 Encounter for antineoplastic immunotherapy: Secondary | ICD-10-CM | POA: Insufficient documentation

## 2019-08-27 DIAGNOSIS — C9110 Chronic lymphocytic leukemia of B-cell type not having achieved remission: Secondary | ICD-10-CM | POA: Diagnosis not present

## 2019-08-27 DIAGNOSIS — Z170 Estrogen receptor positive status [ER+]: Secondary | ICD-10-CM | POA: Insufficient documentation

## 2019-08-27 DIAGNOSIS — C50511 Malignant neoplasm of lower-outer quadrant of right female breast: Secondary | ICD-10-CM | POA: Diagnosis not present

## 2019-08-27 DIAGNOSIS — C50011 Malignant neoplasm of nipple and areola, right female breast: Secondary | ICD-10-CM

## 2019-08-27 MED ORDER — ACETAMINOPHEN 325 MG PO TABS
ORAL_TABLET | ORAL | Status: AC
  Filled 2019-08-27: qty 2

## 2019-08-27 MED ORDER — SODIUM CHLORIDE 0.9 % IV SOLN
375.0000 mg/m2 | Freq: Once | INTRAVENOUS | Status: AC
  Administered 2019-08-27: 10:00:00 800 mg via INTRAVENOUS
  Filled 2019-08-27: qty 50

## 2019-08-27 MED ORDER — DIPHENHYDRAMINE HCL 25 MG PO CAPS
ORAL_CAPSULE | ORAL | Status: AC
  Filled 2019-08-27: qty 1

## 2019-08-27 MED ORDER — DIPHENHYDRAMINE HCL 25 MG PO CAPS
25.0000 mg | ORAL_CAPSULE | Freq: Once | ORAL | Status: AC
  Administered 2019-08-27: 09:00:00 25 mg via ORAL

## 2019-08-27 MED ORDER — DEXAMETHASONE SODIUM PHOSPHATE 10 MG/ML IJ SOLN
INTRAMUSCULAR | Status: AC
  Filled 2019-08-27: qty 1

## 2019-08-27 MED ORDER — SODIUM CHLORIDE 0.9 % IV SOLN
Freq: Once | INTRAVENOUS | Status: AC
  Filled 2019-08-27: qty 250

## 2019-08-27 MED ORDER — ACETAMINOPHEN 325 MG PO TABS
650.0000 mg | ORAL_TABLET | Freq: Once | ORAL | Status: AC
  Administered 2019-08-27: 09:00:00 650 mg via ORAL

## 2019-08-27 MED ORDER — DEXAMETHASONE SODIUM PHOSPHATE 10 MG/ML IJ SOLN
10.0000 mg | Freq: Once | INTRAMUSCULAR | Status: AC
  Administered 2019-08-27: 10 mg via INTRAVENOUS

## 2019-08-27 NOTE — Patient Instructions (Addendum)
[facility] Discharge Instructions for Patients Receiving Chemotherapy  Today you received the following chemotherapy agents: Rituzimab-pvvr   To help prevent nausea and vomiting after your treatment, we encourage you to take your nausea medication as directed.   If you develop nausea and vomiting that is not controlled by your nausea medication, call the clinic.   BELOW ARE SYMPTOMS THAT SHOULD BE REPORTED IMMEDIATELY:  *FEVER GREATER THAN 100.5 F  *CHILLS WITH OR WITHOUT FEVER  NAUSEA AND VOMITING THAT IS NOT CONTROLLED WITH YOUR NAUSEA MEDICATION  *UNUSUAL SHORTNESS OF BREATH  *UNUSUAL BRUISING OR BLEEDING  TENDERNESS IN MOUTH AND THROAT WITH OR WITHOUT PRESENCE OF ULCERS  *URINARY PROBLEMS  *BOWEL PROBLEMS  UNUSUAL RASH Items with * indicate a potential emergency and should be followed up as soon as possible.  Feel free to call the clinic should you have any questions or concerns. The clinic phone number is [phone].  Please show the [facility] at check-in to the Emergency Department and triage nurse.

## 2019-08-30 DIAGNOSIS — F411 Generalized anxiety disorder: Secondary | ICD-10-CM | POA: Diagnosis not present

## 2019-08-30 DIAGNOSIS — F3341 Major depressive disorder, recurrent, in partial remission: Secondary | ICD-10-CM | POA: Diagnosis not present

## 2019-09-02 ENCOUNTER — Other Ambulatory Visit: Payer: Self-pay

## 2019-09-02 ENCOUNTER — Encounter: Payer: Self-pay | Admitting: Cardiology

## 2019-09-02 ENCOUNTER — Ambulatory Visit: Payer: Medicare Other | Admitting: Cardiology

## 2019-09-02 ENCOUNTER — Encounter (INDEPENDENT_AMBULATORY_CARE_PROVIDER_SITE_OTHER): Payer: Self-pay

## 2019-09-02 VITALS — BP 124/70 | HR 102 | Ht 65.0 in | Wt 189.0 lb

## 2019-09-02 DIAGNOSIS — I951 Orthostatic hypotension: Secondary | ICD-10-CM | POA: Diagnosis not present

## 2019-09-02 DIAGNOSIS — R079 Chest pain, unspecified: Secondary | ICD-10-CM | POA: Diagnosis not present

## 2019-09-02 DIAGNOSIS — R0602 Shortness of breath: Secondary | ICD-10-CM | POA: Diagnosis not present

## 2019-09-02 DIAGNOSIS — R0789 Other chest pain: Secondary | ICD-10-CM

## 2019-09-02 NOTE — Progress Notes (Signed)
Cardiology Office Note    Date:  09/02/2019   ID:  [name] [name], DOB 04-15-1946, MRN [id number]  PCP:  Deland Pretty, MD  Cardiologist:   Candee Furbish, MD     History of Present Illness:  [name] [name] is a 74 y.o. female former patient of Dr. Ron Parker with history of breast cancer,and radiation chemotherapy 2017, shortness of breath, orthostatic hypotension with several echoes in the past showing stable normal left ventricular function. No chest pain. No recent shortness of breath. No syncope. She's had mild sinus tachycardia at rest. Anxiety in the past.  She was in a skilled nursing facility after multilevel thoracic decompression and fusion.  Overall she is doing quite well from a cardiac standpoint. No complaints no chest pain, no shortness of breath, no syncope. She was taken off of the metoprolol. This is fine.  Has chronic lymphocytic leukemia-white count usually stable at around 31,000.  Has hypothyroidism as well. On Synthroid.  04/25/2019 - Here for the follow-up of prior orthostatic hypotension, breast cancer, ongoing shortness of breath, occasional atypical chest pain.  Dr. Jana Hakim.  In review of Dr. Virgie Dad note from 03/25/2019 she had been having shortness of breath even with mild activity.  Fatigue.  Occasional cough.  Neuropathy in her legs.  There is not a simple explanation for her shortness of breath.  Could be deconditioning.  Notes, she has seen Dr. Melvyn Novas in 2017 for shortness of breath. SOB with activity. Not all the time. If go up to bathroom at night. Severe Chest pain as well off and on, can happen at night, scare her. Lethargic. Had back surgery 7 hours, 3 years and still has pain.   In 2017 she also saw Dr. Einar Gip who performed echocardiogram that showed normal pump function without any significant valvular abnormalities.  She also had a nuclear stress test on 10/12/2015 that showed no evidence of ischemia.  09/02/2019 -Here for follow-up of chest pain  shortness of breath.  Still having the symptoms.  Once again reassurance from cardiac CT scan showing no evidence of any flow-limiting CAD.  Echocardiogram was also normal.  Noncardiac etiology.  PE CT was also negative. Denies any fevers chills nausea vomiting    Past Medical History:  Diagnosis Date  . Alopecia   . Anxiety   . Arthritis    "spine" (07/28/2013)  . Breast cancer ([place]) 1997; 2014   right  . CAP (community acquired pneumonia)    admission 06-04-2013, failed outpatient  . Chronic back pain    "lower back and upper neck" (07/28/2013)  . CLL (chronic lymphocytic leukemia) ([place]) 05/2013  . DDD (degenerative disc disease)   . 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 ear   . Depression   . Diverticulosis   . Elevated LFTs   . Fibromyalgia 1978  . GERD (gastroesophageal reflux disease)   . Hematuria   . History of syncope    episode 2008--  no recurrence since  . HLD (hyperlipidemia)   . Hypothyroidism   . Kidney stones 2014  . Plantar fasciitis   . Ruptured disk    one in neck and two in back  . S/P chemotherapy, time since greater than 12 weeks   . Sinus tachycardia    mild resting  . Skin cancer   . Stool incontinence    "at times recently"     Past Surgical History:  Procedure Laterality Date  . ANTERIOR LATERAL LUMBAR FUSION 4 LEVELS Left 04/25/2016   Procedure: LEFT LUMBAR ONE-TWO,  LUMBAR TWO-THREE, LUMBAR THREE-FOUR, LUMBAR FOUR-FIVE ANTERIOR LATERAL LUMBAR FUSION;  Surgeon: Earnie Larsson, MD;  Location: [facility];  Service: Neurosurgery;  Laterality: Left;  . APPENDECTOMY  1997  . APPLICATION OF ROBOTIC ASSISTANCE FOR SPINAL PROCEDURE N/A 04/06/2017   Procedure: APPLICATION OF ROBOTIC ASSISTANCE FOR SPINAL PROCEDURE;  Surgeon: Earnie Larsson, MD;  Location: [facility];  Service: Neurosurgery;  Laterality: N/A;  . BREAST BIOPSY Right 2014  . BREAST LUMPECTOMY Right 1997  . BREAST LUMPECTOMY WITH AXILLARY LYMPH NODE DISSECTION Right 1997  . CATARACT EXTRACTION, BILATERAL  2019   Dr.  Kathlen Mody  . CYSTOSCOPY WITH RETROGRADE PYELOGRAM, URETEROSCOPY AND STENT PLACEMENT Bilateral 06/17/2013   Procedure: CYSTOSCOPY WITH RETROGRADE PYELOGRAM, URETEROSCOPY AND LEFT DOUBLE  J STENT PLACEMENT RIGHT URETERAL HOLMIIUM LASER AND DOUBLE J STENT ;  Surgeon: Hanley Ben, MD;  Location: [facility];  Service: Urology;  Laterality: Bilateral;  . HOLMIUM LASER APPLICATION Bilateral [id number]   Procedure: HOLMIUM LASER APPLICATION;  Surgeon: Hanley Ben, MD;  Location: [facility];  Service: Urology;  Laterality: Bilateral;  . LITHOTRIPSY Left 06/2013  . LUMBAR EPIDURAL INJECTION     has had 7 injections  . PORT-A-CATH REMOVAL Left 10/03/2014   Procedure: REMOVAL PORT-A-CATH;  Surgeon: Donnie Mesa, MD;  Location: [facility];  Service: General;  Laterality: Left;  . PORTACATH PLACEMENT Left 07/28/2013   Procedure: ATTEMPTED INSERTION PORT-A-CATH;  Surgeon: Imogene Burn. Georgette Dover, MD;  Location: [facility];  Service: General;  Laterality: Left;  . POSTERIOR LUMBAR FUSION 4 LEVEL N/A 04/25/2016   Procedure: LUMBAR FIVE-SACRAL ONE POSTERIOR LUMBAR INTERBODY FUSION, THORACIC NINE-SACRAL ONE POSTERIOR LATERAL ARTHRODESIS WITH PEDICLE SCREWS;  Surgeon: Earnie Larsson, MD;  Location: [facility];  Service: Neurosurgery;  Laterality: N/A;  . RETINAL DETACHMENT SURGERY Right 2013  . ROBOTIC ASSITED PARTIAL NEPHRECTOMY Right 02/05/2015   Procedure: ROBOTIC ASSITED PARTIAL NEPHRECTOMY;  Surgeon: Raynelle Bring, MD;  Location: [facility];  Service: Urology;  Laterality: Right;  . SIMPLE MASTECTOMY WITH AXILLARY SENTINEL NODE BIOPSY Right 07/28/2013   Procedure: RIGHT TOTAL  MASTECTOMY;  Surgeon: Imogene Burn. Georgette Dover, MD;  Location: [facility];  Service: General;  Laterality: Right;  . TONSILLECTOMY  AGE 27  . TOTAL ABDOMINAL HYSTERECTOMY W/ BILATERAL SALPINGOOPHORECTOMY  1997    Current Medications: Outpatient Medications Prior to Visit  Medication Sig Dispense Refill  . allopurinol  (ZYLOPRIM) 300 MG tablet Take 1 tablet (300 mg total) by mouth daily. 90 tablet 4  . amitriptyline (ELAVIL) 100 MG tablet Take 100 mg by mouth at bedtime.    Marland Kitchen buPROPion (WELLBUTRIN XL) 300 MG 24 hr tablet Take 300 mg by mouth daily.    . DULoxetine (CYMBALTA) 60 MG capsule Take 60 mg by mouth daily.    Marland Kitchen levothyroxine (SYNTHROID, LEVOTHROID) 88 MCG tablet Take 88 mcg by mouth daily before breakfast.   4  . metoprolol tartrate (LOPRESSOR) 100 MG tablet Take 1 tablet (100 mg) by mouth once 2 hours prior to procedure 1 tablet 0  . omeprazole (PRILOSEC) 40 MG capsule TAKE 1 CAPSULE(40 MG) BY MOUTH DAILY AFTER BREAKFAST 90 capsule 0  . REXULTI 2 MG TABS Take 2 mg by mouth at bedtime.   1  . rosuvastatin (CRESTOR) 5 MG tablet Take 1 tablet (5 mg total) by mouth daily. 90 tablet 3  . amitriptyline (ELAVIL) 150 MG tablet Take 150 mg by mouth at bedtime.    . DULoxetine (CYMBALTA) 30 MG capsule Take 2 capsules (60 mg total)  by mouth daily after breakfast.     No facility-administered medications prior to visit.     Allergies:   Lasix [furosemide], Lithium, Sulfa antibiotics, Trazodone and nefazodone, and Lyrica [pregabalin]   Social History   Socioeconomic History  . Marital status: Married    Spouse name: [name] [name]  . Number of children: 1  . Years of education: Not on file  . Highest education level: Not on file  Occupational History  . Occupation: homemaker  Tobacco Use  . Smoking status: Never Smoker  . Smokeless tobacco: Never Used  Substance and Sexual Activity  . Alcohol use: No    Comment: 07/28/2013 "no alcohol since 1994; never had problem w/it"  . Drug use: No  . Sexual activity: Not Currently    Partners: Male    Birth control/protection: Surgical    Comment: TAH/BSO  Other Topics Concern  . Not on file  Social History Narrative  . Not on file   Social Determinants of Health   Financial Resource Strain:   . Difficulty of Paying Living Expenses: Not on file  Food  Insecurity:   . Worried About Charity fundraiser in the Last Year: Not on file  . Ran Out of Food in the Last Year: Not on file  Transportation Needs:   . Lack of Transportation (Medical): Not on file  . Lack of Transportation (Non-Medical): Not on file  Physical Activity:   . Days of Exercise per Week: Not on file  . Minutes of Exercise per Session: Not on file  Stress:   . Feeling of Stress : Not on file  Social Connections:   . Frequency of Communication with Friends and Family: Not on file  . Frequency of Social Gatherings with Friends and Family: Not on file  . Attends Religious Services: Not on file  . Active Member of Clubs or Organizations: Not on file  . Attends Archivist Meetings: Not on file  . Marital Status: Not on file     Family History:  The patient's family history includes Bipolar disorder in her maternal aunt; Brain cancer in her paternal grandmother; Colon cancer (age of onset: 46) in her maternal aunt; Dementia in her mother; Diabetes in her maternal aunt, maternal grandfather, and mother; Heart disease in her maternal grandfather; Osteoporosis in her mother; Prostate cancer (age of onset: 103) in her father; Stomach cancer in her paternal uncle; Stroke in her maternal aunt.   ROS:   Please see the history of present illness.    ROS All other systems reviewed and are negative.   PHYSICAL EXAM:   VS:  BP 124/70   Pulse (!) 102   Ht 5\' 5"  (1.651 m)   Wt 189 lb (85.7 kg)   LMP 08/26/1995   SpO2 97%   BMI 31.45 kg/m    GEN: Well nourished, well developed, in no acute distress  HEENT: normal  Neck: no JVD, carotid bruits, or masses Cardiac: RRR; no murmurs, rubs, or gallops,no edema  Respiratory:  clear to auscultation bilaterally, normal work of breathing GI: soft, nontender, nondistended, + BS MS: no deformity or atrophy  Skin: warm and dry, no rash Neuro:  Alert and Oriented x 3, Strength and sensation are intact Psych: euthymic mood, full  affect  Wt Readings from Last 3 Encounters:  09/02/19 189 lb (85.7 kg)  08/24/19 187 lb (84.8 kg)  08/17/19 188 lb 1.6 oz (85.3 kg)      Studies/Labs Reviewed:   EKG:  EKG is ordered today.  The ekg ordered today demonstrates 09/24/2018-sinus rhythm with PACs nonspecific T wave changes.  09/24/16-sinus rhythm, 99, no other abnormalities.  Recent Labs: 08/17/2019: ALT 12; BUN 16; Creatinine 0.89; Potassium 4.5; Sodium 139 08/24/2019: Hemoglobin 12.6; Platelets 252   Lipid Panel No results found for: CHOL, TRIG, HDL, CHOLHDL, VLDL, LDLCALC, LDLDIRECT  Additional studies/ records that were reviewed today include: Prior lab work, EKG, office notes reviewed.  Echocardiogram 08/28/14:  - Left ventricle: The cavity size was normal. Systolic function was normal. The estimated ejection fraction was in the range of 55% to 60%. Wall motion was normal; there were no regional wall motion abnormalities.  Impressions:  - Global LV longitudinal strain -19.7%  ASSESSMENT:    1. Chest pain, unspecified type   2. Orthostatic hypotension   3. SOB (shortness of breath)   4. Atypical chest pain      PLAN:  In order of problems listed above:  Breast cancer  - Prior chemotherapy. LV function normal by echocardiogram  Orthostatic hypotension  - Improved. Hydration. Have fallen. Fell on tub and table. No bones broken.  Be very careful.  She may even wish to salt her food.  Dyspnea/chest pain -Coronary CT scan performed on 05/18/2019: 1. Coronary calcium score of 12. This was 1 percentile for age and sex matched control.  2. Normal coronary origin with right dominance.  3. No evidence of flow limiting CAD. Small areas of proximal LAD calcification with no surrounding stenosis. Continue with risk factor modification.  Overall reassurance with ultimate testing from coronary CT to see her anatomy.  No evidence of flow-limiting CAD.  Noncardiac chest pain/shortness of breath.   Continue with conditioning efforts.  Encouraged relaxation techniques, deep breathing exercises.  History of sinus tachycardia/sinus arrhythmia  - Benign. She is off of the metoprolol.  No changes made.  Sinus arrhythmia noted on ECG.  Not atrial fibrillation.  CLL  - White count around 31,000. Stable.  PRN follow-up  Medication Adjustments/Labs and Tests Ordered: Current medicines are reviewed at length with the patient today.  Concerns regarding medicines are outlined above.  Medication changes, Labs and Tests ordered today are listed in the Patient Instructions below. Patient Instructions  Medication Instructions:  The current medical regimen is effective;  continue present plan and medications.  *If you need a refill on your cardiac medications before your next appointment, please call your pharmacy*  Follow-Up: Follow up as needed with Dr Marlou Porch.  Thank you for choosing [facility]!!        Signed, Candee Furbish, MD  09/02/2019 12:02 PM    [address], [place], [place]  [id number] Phone: [phone]; Fax: [phone]

## 2019-09-02 NOTE — Patient Instructions (Signed)
Medication Instructions:  The current medical regimen is effective;  continue present plan and medications.  If you need a refill on your cardiac medications before your next appointment, please call your pharmacy.   Follow-Up: Follow up as needed with Dr Skains.  Thank you for choosing [facility]!!      

## 2019-09-13 ENCOUNTER — Ambulatory Visit: Payer: Medicare Other | Attending: Internal Medicine

## 2019-09-13 DIAGNOSIS — Z23 Encounter for immunization: Secondary | ICD-10-CM

## 2019-09-13 NOTE — Progress Notes (Signed)
   Covid-19 Vaccination Clinic  Name:  [name] [name]    MRN: [id number] DOB: 07/26/1946  09/13/2019  Ms. [name] was observed post Covid-19 immunization for 15 minutes without incidence. She was provided with Vaccine Information Sheet and instruction to access the V-Safe system.   Ms. [name] was instructed to call 911 with any severe reactions post vaccine: Marland Kitchen Difficulty breathing  . Swelling of your face and throat  . A fast heartbeat  . A bad rash all over your body  . Dizziness and weakness    Immunizations Administered    Name Date Dose VIS Date Route   Pfizer COVID-19 Vaccine 09/13/2019  1:24 PM 0.3 mL 08/05/2019 Intramuscular   Manufacturer: [employer], [employer]   Lot: [id number]   [place]: [id number]

## 2019-09-30 ENCOUNTER — Telehealth: Payer: Self-pay | Admitting: *Deleted

## 2019-09-30 NOTE — Telephone Encounter (Signed)
Received VM from pt, attempt x1 to return call, no answer and unable to LVM due to VM not being set up.

## 2019-10-04 ENCOUNTER — Ambulatory Visit: Payer: Medicare Other | Attending: Internal Medicine

## 2019-10-04 DIAGNOSIS — Z23 Encounter for immunization: Secondary | ICD-10-CM | POA: Insufficient documentation

## 2019-10-04 NOTE — Progress Notes (Signed)
   Covid-19 Vaccination Clinic  Name:  [name] [name]    MRN: [id number] DOB: 12/18/1945  10/04/2019  [name] was observed post Covid-19 immunization for 15 minutes without incidence. She was provided with Vaccine Information Sheet and instruction to access the V-Safe system.   [name] was instructed to call 911 with any severe reactions post vaccine: Marland Kitchen Difficulty breathing  . Swelling of your face and throat  . A fast heartbeat  . A bad rash all over your body  . Dizziness and weakness    Immunizations Administered    Name Date Dose VIS Date Route   Pfizer COVID-19 Vaccine 10/04/2019  3:23 PM 0.3 mL 08/05/2019 Intramuscular   Manufacturer: [place]   Lot: [id number]   [place]: [id number]

## 2019-10-24 NOTE — Progress Notes (Signed)
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   Her most recent mammogram was 04/04/2019.  It showed the breast density category B.  There was no evidence of malignancy.  She continues on rituximab, now on maintenance given every 8 weeks.  She tolerates this with no side effects that she is aware of   REVIEW OF SYSTEMS: [name] [name] does not like getting stuck for labs and then again getting stuck for treatment, but she also does not want a port.  She is not exercising regularly.  She says her husband is extremely attentive and not only drives her everywhere but also gets her milkshakes and banana splits and that is why she is gaining weight.  She did receive both her COVID-19 vaccines, the second 1 on 09/27/2019.  She has had a bit of a cough, which is not productive, which is not associated with fever, and which is worse when she lies down at night.  A detailed review of systems today was otherwise stable.     BREAST CANCER HISTORY: From the prior summary:  [name] [name] has a history of breast cancer dating back to 1997, [name] [name] Dr. Annabell Sabal performed a right lumpectomy and axillary lymph node dissection for what  according to the patient and her family was a stage I invasive ductal breast cancer. She received radiation adjuvantly and then took tamoxifen for 5 years  In February of 2014 she had a normal screening mammography, but in November 2014 she felt a "hard lump" in her right breast. She brought this to the attention of her urologist, Dr. Izora Gala, and he set her up for bilateral diagnostic mammography with mammography at [facility] 07/13/2013. This showed her breast density to be category B. There was no mammographic abnormality noted but ultrasonography showed a 1.4 cm irregularly shaped solid mass in the right breast at the 8:00 position. This was biopsied the same day, and showed ([id number]) an invasive ductal carcinoma, grade 2, estrogen receptor 100% positive, progesterone receptor 13% positive, with an MIB-1 of 33% and HER-2 amplification with a HER-2: CEP 17 ratio of 3.19, and a HER-2 copy number percent all of 4.15.  The patient's subsequent history is as detailed below     PAST MEDICAL HISTORY: Past Medical History:  Diagnosis Date  . Alopecia   . Anxiety   . Arthritis    "spine" (07/28/2013)  . Breast cancer ([place]) 1997; 2014   right  . CAP (community acquired pneumonia)    admission 06-04-2013, failed outpatient  . Chronic back pain    "lower back and upper neck" (07/28/2013)  . CLL (chronic lymphocytic leukemia) ([place]) 05/2013  . DDD (degenerative disc disease)   . 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 ear   . Depression   . Diverticulosis   . Elevated LFTs   . Fibromyalgia 1978  . GERD (gastroesophageal  reflux disease)   . Hematuria   . History of syncope    episode 2008--  no recurrence since  . HLD (hyperlipidemia)   . Hypothyroidism   . Kidney stones 2014  . Plantar fasciitis   . Ruptured disk    one in neck and two in back  . S/P chemotherapy, time since greater than 12 weeks   . Sinus tachycardia    mild resting  . Skin cancer   . Stool incontinence    "at times recently"     PAST  SURGICAL HISTORY: Past Surgical History:  Procedure Laterality Date  . ANTERIOR LATERAL LUMBAR FUSION 4 LEVELS Left 04/25/2016   Procedure: LEFT LUMBAR ONE-TWO, LUMBAR TWO-THREE, LUMBAR THREE-FOUR, LUMBAR FOUR-FIVE ANTERIOR LATERAL LUMBAR FUSION;  Surgeon: Earnie Larsson, MD;  Location: [place] [facility];  Service: Neurosurgery;  Laterality: Left;  . APPENDECTOMY  1997  . APPLICATION OF ROBOTIC ASSISTANCE FOR SPINAL PROCEDURE N/A 04/06/2017   Procedure: APPLICATION OF ROBOTIC ASSISTANCE FOR SPINAL PROCEDURE;  Surgeon: Earnie Larsson, MD;  Location: [facility];  Service: Neurosurgery;  Laterality: N/A;  . BREAST BIOPSY Right 2014  . BREAST LUMPECTOMY Right 1997  . BREAST LUMPECTOMY WITH AXILLARY LYMPH NODE DISSECTION Right 1997  . CATARACT EXTRACTION, BILATERAL  2019   Dr. Kathlen Mody  . CYSTOSCOPY WITH RETROGRADE PYELOGRAM, URETEROSCOPY AND STENT PLACEMENT Bilateral 06/17/2013   Procedure: CYSTOSCOPY WITH RETROGRADE PYELOGRAM, URETEROSCOPY AND LEFT DOUBLE  J STENT PLACEMENT RIGHT URETERAL HOLMIIUM LASER AND DOUBLE J STENT ;  Surgeon: Hanley Ben, MD;  Location: [facility];  Service: Urology;  Laterality: Bilateral;  . HOLMIUM LASER APPLICATION Bilateral 61/95/0932   Procedure: HOLMIUM LASER APPLICATION;  Surgeon: Hanley Ben, MD;  Location: [place];  Service: Urology;  Laterality: Bilateral;  . LITHOTRIPSY Left 06/2013  . LUMBAR EPIDURAL INJECTION     has had 7 injections  . PORT-A-CATH REMOVAL Left 10/03/2014   Procedure: REMOVAL PORT-A-CATH;  Surgeon: Donnie Mesa, MD;  Location: [facility];  Service: General;  Laterality: Left;  . PORTACATH PLACEMENT Left 07/28/2013   Procedure: ATTEMPTED INSERTION PORT-A-CATH;  Surgeon: Imogene Burn. Georgette Dover, MD;  Location: [place];  Service: General;  Laterality: Left;  . POSTERIOR LUMBAR FUSION 4 LEVEL N/A 04/25/2016   Procedure: LUMBAR FIVE-SACRAL ONE POSTERIOR LUMBAR INTERBODY FUSION, THORACIC NINE-SACRAL ONE POSTERIOR LATERAL  ARTHRODESIS WITH PEDICLE SCREWS;  Surgeon: Earnie Larsson, MD;  Location: [place] [facility];  Service: Neurosurgery;  Laterality: N/A;  . RETINAL DETACHMENT SURGERY Right 2013  . ROBOTIC ASSITED PARTIAL NEPHRECTOMY Right 02/05/2015   Procedure: ROBOTIC ASSITED PARTIAL NEPHRECTOMY;  Surgeon: Raynelle Bring, MD;  Location: [facility];  Service: Urology;  Laterality: Right;  . SIMPLE MASTECTOMY WITH AXILLARY SENTINEL NODE BIOPSY Right 07/28/2013   Procedure: RIGHT TOTAL  MASTECTOMY;  Surgeon: Imogene Burn. Georgette Dover, MD;  Location: [place];  Service: General;  Laterality: Right;  . TONSILLECTOMY  AGE 30  . TOTAL ABDOMINAL HYSTERECTOMY W/ BILATERAL SALPINGOOPHORECTOMY  1997    FAMILY HISTORY Family History  Problem Relation Age of Onset  . Heart disease Maternal Grandfather   . Diabetes Maternal Grandfather   . Colon cancer Maternal Aunt 79  . Brain cancer Paternal Grandmother        dx in 73s  . Dementia Mother   . Diabetes Mother   . Osteoporosis Mother   . Diabetes Maternal Aunt   . Prostate cancer Father 62  . Bipolar disorder Maternal Aunt   . Stroke Maternal Aunt   .  Stomach cancer Paternal Uncle        dx in late 23s   the patient's mother died at the age of 107. The patient's father is alive at age 53. She had no brothers or sisters. Her father has a history of prostate cancer. There is no history of breast or ovarian cancer in the family. One maternal first cousin has a history of Hodgkin's lymphoma.   GYNECOLOGIC HISTORY:  Menarche age 22, first live birth age 35. She is GX P1. She underwent total abdominal hysterectomy and bilateral salpingo-oophorectomy in 1997 she did not take hormone replacement.   SOCIAL HISTORY: (Updated  11/15/2013)  She is a homemaker. Her husband Arnette Norris farms approximately 500 acres, including quite a bit of tobacco. 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 The patient has no grandchildren. She has  two "grand cats". She attends a local united church of [place]: Not in place   HEALTH MAINTENANCE:  Social History   Tobacco Use  . Smoking status: Never Smoker  . Smokeless tobacco: Never Used  Substance Use Topics  . Alcohol use: No    Comment: 07/28/2013 "no alcohol since 1994; never had problem w/it"  . Drug use: No     Colonoscopy: 2009/ [facility]  PAP: Status post hysterectomy  Bone density: [facility] 10/14/[facility] was normal  Lipid panel:  Not on file   Allergies  Allergen Reactions  . Lasix [Furosemide] Nausea And Vomiting and Other (See Comments)    HEADACHE  . Lithium Other (See Comments)    Dizziness, "cause me to fall"  . Sulfa Antibiotics Hives  . Trazodone And Nefazodone Other (See Comments)    insomnia  . Lyrica [Pregabalin] Diarrhea, Nausea Only and Rash    Current Outpatient Medications  Medication Sig Dispense Refill  . allopurinol (ZYLOPRIM) 300 MG tablet Take 1 tablet (300 mg total) by mouth daily. 90 tablet 4  . amitriptyline (ELAVIL) 100 MG tablet Take 100 mg by mouth at bedtime.    Marland Kitchen buPROPion (WELLBUTRIN XL) 300 MG 24 hr tablet Take 300 mg by mouth daily.    . DULoxetine (CYMBALTA) 60 MG capsule Take 60 mg by mouth daily.    Marland Kitchen levothyroxine (SYNTHROID, LEVOTHROID) 88 MCG tablet Take 88 mcg by mouth daily before breakfast.   4  . metoprolol tartrate (LOPRESSOR) 100 MG tablet Take 1 tablet (100 mg) by mouth once 2 hours prior to procedure 1 tablet 0  . omeprazole (PRILOSEC) 40 MG capsule TAKE 1 CAPSULE(40 MG) BY MOUTH DAILY AFTER BREAKFAST 90 capsule 0  . REXULTI 2 MG TABS Take 2 mg by mouth at bedtime.   1  . rosuvastatin (CRESTOR) 5 MG tablet Take 1 tablet (5 mg total) by mouth daily. 90 tablet 3   No current facility-administered medications for this visit.    OBJECTIVE: Older white [name] who appears stated age  41:   10/25/19 1115  BP: 134/67  Pulse: 98  Resp: 18  Temp: 98.2 F (36.8 C)  SpO2: 98%   Wt  Readings from Last 3 Encounters:  10/25/19 198 lb 3.2 oz (89.9 kg)  09/02/19 189 lb (85.7 kg)  08/24/19 187 lb (84.8 kg)   Body mass index is 32.98 kg/m.    ECOG FS:1 - Symptomatic but completely ambulatory  Sclerae unicteric, EOMs intact Wearing a mask No cervical or supraclavicular adenopathy Lungs no rales or rhonchi Heart regular rate and  rhythm Abd soft, nontender, positive bowel sounds MSK no focal spinal tenderness, no upper extremity lymphedema Neuro: nonfocal, well oriented, appropriate affect Breasts: The right breast is status post mastectomy.  There is no evidence of chest wall recurrence.  The left breast is benign.  Both axillae are benign.   LAB RESULTS:  Lab Results  Component Value Date   WBC 6.7 10/25/2019   NEUTROABS 4.3 10/25/2019   HGB 12.2 10/25/2019   HCT 39.1 10/25/2019   MCV 86.3 10/25/2019   PLT 228 10/25/2019      Chemistry      Component Value Date/Time   NA 139 08/17/2019 1052   NA 140 03/25/2017 1300   K 4.5 08/17/2019 1052   K 4.2 03/25/2017 1300   CL 107 08/17/2019 1052   CO2 20 (L) 08/17/2019 1052   CO2 24 03/25/2017 1300   BUN 16 08/17/2019 1052   BUN 17.9 03/25/2017 1300   CREATININE 0.89 08/17/2019 1052   CREATININE 1.1 03/25/2017 1300      Component Value Date/Time   CALCIUM 9.1 08/17/2019 1052   CALCIUM 9.4 03/25/2017 1300   ALKPHOS 131 (H) 08/17/2019 1052   ALKPHOS 102 03/25/2017 1300   AST 18 08/17/2019 1052   AST 41 (H) 03/25/2017 1300   ALT 12 08/17/2019 1052   ALT 52 03/25/2017 1300   BILITOT 0.3 08/17/2019 1052   BILITOT 0.36 03/25/2017 1300      STUDIES: No results found.   ASSESSMENT: 74 y.o. BRCA negative [name] [name]  (1) status post right breast lumpectomy and axillary lymph node dissection in 1997 for a stage I breast cancer, treated with adjuvant radiation and tamoxifen for 5 years  (2) status post right breast lower outer quadrant biopsy 07/13/2013 for a clinical T1c N0, stage IA invasive  ductal carcinoma, grade 2, estrogen receptor 100% positive, progesterone receptor 13% positive, with an MIB-1 of 33%, and HER-2 amplification by CISH with a HER2/CEP 17 ratio of 3.19, and an average HER-2 copy number per cell of 4.15  (3) status post right mastectomy 07/28/2013 for a pT1c pN0, stage IA invasive ductal carcinoma, grade 3, with close but negative margins. Prognostic panel was not repeated  (a) the patient met with Dr. Harlow Mares and has decided against reconstruction  (4) completed weekly paclitaxel x12 12/06/2913, with trastuzumab/ pertuzumab every 3 weeks; pertuzumab was held with third dose on 11/01/2013 due to diarrhea, tried a half dose on cycle 4 again with diarrhea developing  (5) trastuzumab (started 09/20/2013) continued for 1 year, last dose 09/21/2014;  (a) final echocardiogram 09/07/2014 showed an ejection fraction of 55-60%  (6) anastrozole started May 2015; completed April 2020  (a) bone density 12/15/2013 normal  (b) bone density 02/01/2016 at [facility] was normal with a T score of -1.0   OTHER PROBLEMS: (a) History of chronic lymphoid leukemia diagnosed by flow cytometry 06/29/2013, the cells being CD5, CD20 and CD23 positive, CD10 negative.   (1) right renal mass resected on 02/05/15, consisting of atypical lymphoid proliferation composed of monotonous small lymphocytes   (2) Anemia with a normal MCV and normal ferritin-- B-12 and folate normal; stable  (3)  ibrutinib 280 milligrams daily started 08/01/202020, discontinued 06/03/2019 because of concerns regarding dosing  (4) rituximab weekly x8 started 06/15/2019 completed 08/24/2019  (5) maintenance rituximab (Q 2 months) to start 10/25/2019, to continue for 1 year    PLAN: Tywana has no anemia or leukopenia or thrombocytopenia and has a normal lymphocyte count.  She has no "  B" symptoms, and no adenopathy.  This is all very favorable.  The plan is to continue rituximab every 2 months through December of this  year.  We again discussed a port.  She feels she only has 5 more treatments" she can deal with it".  She is very concerned about her cough.  I think this is going to be due to reflux.  We are obtaining a chest x-ray today.  If it is clear I will suggest she start Prilosec  Otherwise she will return to see me in 4 months.  She knows to call for any other issue that may develop before the next visit.  Total encounter time 30 minutes.Sarajane Jews C. , MD  10/25/19 11:28 AM Medical Oncology and Hematology [facility] [place], [place] [address] Tel. [phone]    Fax. [phone]   I, Wilburn Mylar, am acting as scribe for Dr. Virgie Dad. .  I, Lurline Del MD, have reviewed the above documentation for accuracy and completeness, and I agree with the above.   *Total Encounter Time as defined by the Centers for Medicare and Medicaid Services includes, in addition to the face-to-face time of a patient visit (documented in the note above) non-face-to-face time: obtaining and reviewing outside history, ordering and reviewing medications, tests or procedures, care coordination (communications with other health care professionals or caregivers) and documentation in the medical record.

## 2019-10-25 ENCOUNTER — Inpatient Hospital Stay: Payer: Medicare Other | Attending: Oncology

## 2019-10-25 ENCOUNTER — Inpatient Hospital Stay: Payer: Medicare Other

## 2019-10-25 ENCOUNTER — Ambulatory Visit (HOSPITAL_COMMUNITY)
Admission: RE | Admit: 2019-10-25 | Discharge: 2019-10-25 | Disposition: A | Discharge status: Home / Self Care | Payer: Medicare Other | Source: Ambulatory Visit | Attending: Oncology | Admitting: Oncology

## 2019-10-25 ENCOUNTER — Inpatient Hospital Stay (HOSPITAL_BASED_OUTPATIENT_CLINIC_OR_DEPARTMENT_OTHER): Payer: Medicare Other | Admitting: Oncology

## 2019-10-25 ENCOUNTER — Other Ambulatory Visit: Payer: Self-pay

## 2019-10-25 VITALS — BP 134/67 | HR 98 | Temp 98.2°F | Resp 18 | Ht 65.0 in | Wt 198.2 lb

## 2019-10-25 VITALS — BP 119/61 | HR 90 | Resp 18

## 2019-10-25 DIAGNOSIS — C9110 Chronic lymphocytic leukemia of B-cell type not having achieved remission: Secondary | ICD-10-CM

## 2019-10-25 DIAGNOSIS — Z8249 Family history of ischemic heart disease and other diseases of the circulatory system: Secondary | ICD-10-CM | POA: Insufficient documentation

## 2019-10-25 DIAGNOSIS — K219 Gastro-esophageal reflux disease without esophagitis: Secondary | ICD-10-CM | POA: Diagnosis not present

## 2019-10-25 DIAGNOSIS — R05 Cough: Secondary | ICD-10-CM | POA: Diagnosis not present

## 2019-10-25 DIAGNOSIS — Z79899 Other long term (current) drug therapy: Secondary | ICD-10-CM | POA: Diagnosis not present

## 2019-10-25 DIAGNOSIS — E039 Hypothyroidism, unspecified: Secondary | ICD-10-CM | POA: Insufficient documentation

## 2019-10-25 DIAGNOSIS — F329 Major depressive disorder, single episode, unspecified: Secondary | ICD-10-CM | POA: Insufficient documentation

## 2019-10-25 DIAGNOSIS — E785 Hyperlipidemia, unspecified: Secondary | ICD-10-CM | POA: Insufficient documentation

## 2019-10-25 DIAGNOSIS — Z5112 Encounter for antineoplastic immunotherapy: Secondary | ICD-10-CM | POA: Diagnosis not present

## 2019-10-25 DIAGNOSIS — F419 Anxiety disorder, unspecified: Secondary | ICD-10-CM | POA: Insufficient documentation

## 2019-10-25 DIAGNOSIS — Z170 Estrogen receptor positive status [ER+]: Secondary | ICD-10-CM | POA: Insufficient documentation

## 2019-10-25 DIAGNOSIS — Z8262 Family history of osteoporosis: Secondary | ICD-10-CM | POA: Diagnosis not present

## 2019-10-25 DIAGNOSIS — C50511 Malignant neoplasm of lower-outer quadrant of right female breast: Secondary | ICD-10-CM

## 2019-10-25 DIAGNOSIS — Z85828 Personal history of other malignant neoplasm of skin: Secondary | ICD-10-CM | POA: Insufficient documentation

## 2019-10-25 DIAGNOSIS — R0602 Shortness of breath: Secondary | ICD-10-CM | POA: Diagnosis not present

## 2019-10-25 DIAGNOSIS — M1990 Unspecified osteoarthritis, unspecified site: Secondary | ICD-10-CM | POA: Diagnosis not present

## 2019-10-25 DIAGNOSIS — Z833 Family history of diabetes mellitus: Secondary | ICD-10-CM | POA: Insufficient documentation

## 2019-10-25 LAB — COMPREHENSIVE METABOLIC PANEL
ALT: 15 U/L (ref 0–44)
AST: 17 U/L (ref 15–41)
Albumin: 4.1 g/dL (ref 3.5–5.0)
Alkaline Phosphatase: 110 U/L (ref 38–126)
Anion gap: 11 (ref 5–15)
BUN: 17 mg/dL (ref 8–23)
CO2: 21 mmol/L — ABNORMAL LOW (ref 22–32)
Calcium: 9 mg/dL (ref 8.9–10.3)
Chloride: 109 mmol/L (ref 98–111)
Creatinine, Ser: 1.05 mg/dL — ABNORMAL HIGH (ref 0.44–1.00)
GFR calc Af Amer: >60 mL/min (ref >60)
GFR calc non Af Amer: 53 mL/min — ABNORMAL LOW (ref >60)
Glucose, Bld: 100 mg/dL — ABNORMAL HIGH (ref 70–99)
Potassium: 4.4 mmol/L (ref 3.5–5.1)
Sodium: 141 mmol/L (ref 135–145)
Total Bilirubin: 0.3 mg/dL (ref 0.3–1.2)
Total Protein: 6.6 g/dL (ref 6.5–8.1)

## 2019-10-25 LAB — CBC WITH DIFFERENTIAL/PLATELET
Abs Immature Granulocytes: 0.02 10*3/uL (ref 0.00–0.07)
Basophils Absolute: 0.1 10*3/uL (ref 0.0–0.1)
Basophils Relative: 1 %
Eosinophils Absolute: 0.3 10*3/uL (ref 0.0–0.5)
Eosinophils Relative: 4 %
HCT: 39.1 % (ref 36.0–46.0)
Hemoglobin: 12.2 g/dL (ref 12.0–15.0)
Immature Granulocytes: 0 %
Lymphocytes Relative: 23 %
Lymphs Abs: 1.5 10*3/uL (ref 0.7–4.0)
MCH: 26.9 pg (ref 26.0–34.0)
MCHC: 31.2 g/dL (ref 30.0–36.0)
MCV: 86.3 fL (ref 80.0–100.0)
Monocytes Absolute: 0.6 10*3/uL (ref 0.1–1.0)
Monocytes Relative: 9 %
Neutro Abs: 4.3 10*3/uL (ref 1.7–7.7)
Neutrophils Relative %: 63 %
Platelets: 228 10*3/uL (ref 150–400)
RBC: 4.53 MIL/uL (ref 3.87–5.11)
RDW: 13.9 % (ref 11.5–15.5)
WBC: 6.7 10*3/uL (ref 4.0–10.5)
nRBC: 0 % (ref 0.0–0.2)

## 2019-10-25 LAB — LACTATE DEHYDROGENASE: LDH: 194 U/L — ABNORMAL HIGH (ref 98–192)

## 2019-10-25 MED ORDER — ACETAMINOPHEN 325 MG PO TABS
ORAL_TABLET | ORAL | Status: AC
  Filled 2019-10-25: qty 2

## 2019-10-25 MED ORDER — DEXAMETHASONE SODIUM PHOSPHATE 10 MG/ML IJ SOLN
INTRAMUSCULAR | Status: AC
  Filled 2019-10-25: qty 1

## 2019-10-25 MED ORDER — ACETAMINOPHEN 325 MG PO TABS
650.0000 mg | ORAL_TABLET | Freq: Once | ORAL | Status: AC
  Administered 2019-10-25: 650 mg via ORAL

## 2019-10-25 MED ORDER — DIPHENHYDRAMINE HCL 25 MG PO CAPS
ORAL_CAPSULE | ORAL | Status: AC
  Filled 2019-10-25: qty 1

## 2019-10-25 MED ORDER — DEXAMETHASONE SODIUM PHOSPHATE 10 MG/ML IJ SOLN
10.0000 mg | Freq: Once | INTRAMUSCULAR | Status: AC
  Administered 2019-10-25: 10 mg via INTRAVENOUS

## 2019-10-25 MED ORDER — DIPHENHYDRAMINE HCL 25 MG PO CAPS
25.0000 mg | ORAL_CAPSULE | Freq: Once | ORAL | Status: AC
  Administered 2019-10-25: 25 mg via ORAL

## 2019-10-25 MED ORDER — SODIUM CHLORIDE 0.9 % IV SOLN
375.0000 mg/m2 | Freq: Once | INTRAVENOUS | Status: AC
  Administered 2019-10-25: 800 mg via INTRAVENOUS
  Filled 2019-10-25: qty 80

## 2019-10-25 MED ORDER — SODIUM CHLORIDE 0.9 % IV SOLN
Freq: Once | INTRAVENOUS | Status: AC
  Filled 2019-10-25: qty 250

## 2019-10-25 NOTE — Patient Instructions (Signed)
[facility] Discharge Instructions for Patients Receiving Chemotherapy  Today you received the following chemotherapy agents:  Rituxan.  To help prevent nausea and vomiting after your treatment, we encourage you to take your nausea medication as directed.   If you develop nausea and vomiting that is not controlled by your nausea medication, call the clinic.   BELOW ARE SYMPTOMS THAT SHOULD BE REPORTED IMMEDIATELY:  *FEVER GREATER THAN 100.5 F  *CHILLS WITH OR WITHOUT FEVER  NAUSEA AND VOMITING THAT IS NOT CONTROLLED WITH YOUR NAUSEA MEDICATION  *UNUSUAL SHORTNESS OF BREATH  *UNUSUAL BRUISING OR BLEEDING  TENDERNESS IN MOUTH AND THROAT WITH OR WITHOUT PRESENCE OF ULCERS  *URINARY PROBLEMS  *BOWEL PROBLEMS  UNUSUAL RASH Items with * indicate a potential emergency and should be followed up as soon as possible.  Feel free to call the clinic should you have any questions or concerns. The clinic phone number is [phone].  Please show the CHEMO ALERT CARD at check-in to the Emergency Department and triage nurse.   

## 2019-10-26 LAB — BETA 2 MICROGLOBULIN, SERUM: Beta-2 Microglobulin: 2.9 mg/L — ABNORMAL HIGH (ref 0.6–2.4)

## 2019-11-03 DIAGNOSIS — M25532 Pain in left wrist: Secondary | ICD-10-CM | POA: Diagnosis not present

## 2019-11-03 DIAGNOSIS — S52532A Colles' fracture of left radius, initial encounter for closed fracture: Secondary | ICD-10-CM | POA: Diagnosis not present

## 2019-11-10 DIAGNOSIS — S52532A Colles' fracture of left radius, initial encounter for closed fracture: Secondary | ICD-10-CM | POA: Diagnosis not present

## 2019-11-17 DIAGNOSIS — S52532A Colles' fracture of left radius, initial encounter for closed fracture: Secondary | ICD-10-CM | POA: Diagnosis not present

## 2019-12-01 DIAGNOSIS — S52532A Colles' fracture of left radius, initial encounter for closed fracture: Secondary | ICD-10-CM | POA: Diagnosis not present

## 2019-12-14 NOTE — Progress Notes (Signed)
Pharmacist Chemotherapy Monitoring - Follow Up Assessment    I verify that I have reviewed each item in the below checklist:  . Regimen for the patient is scheduled for the appropriate day and plan matches scheduled date. Marland Kitchen Appropriate non-routine labs are ordered dependent on drug ordered. . If applicable, additional medications reviewed and ordered per protocol based on lifetime cumulative doses and/or treatment regimen.   Plan for follow-up and/or issues identified: No . I-vent associated with next due treatment: No . MD and/or nursing notified: No  Philomena Course 12/14/2019 2:52 PM

## 2019-12-15 DIAGNOSIS — S52532A Colles' fracture of left radius, initial encounter for closed fracture: Secondary | ICD-10-CM | POA: Diagnosis not present

## 2019-12-20 ENCOUNTER — Other Ambulatory Visit: Payer: Self-pay

## 2019-12-20 ENCOUNTER — Inpatient Hospital Stay: Payer: Medicare Other

## 2019-12-20 ENCOUNTER — Inpatient Hospital Stay: Payer: Medicare Other | Attending: Oncology

## 2019-12-20 VITALS — BP 123/64 | HR 87 | Temp 98.7°F | Resp 16

## 2019-12-20 DIAGNOSIS — Z853 Personal history of malignant neoplasm of breast: Secondary | ICD-10-CM | POA: Diagnosis not present

## 2019-12-20 DIAGNOSIS — C9110 Chronic lymphocytic leukemia of B-cell type not having achieved remission: Secondary | ICD-10-CM | POA: Diagnosis not present

## 2019-12-20 DIAGNOSIS — C50511 Malignant neoplasm of lower-outer quadrant of right female breast: Secondary | ICD-10-CM

## 2019-12-20 DIAGNOSIS — Z170 Estrogen receptor positive status [ER+]: Secondary | ICD-10-CM | POA: Diagnosis not present

## 2019-12-20 DIAGNOSIS — Z9011 Acquired absence of right breast and nipple: Secondary | ICD-10-CM | POA: Insufficient documentation

## 2019-12-20 DIAGNOSIS — Z5112 Encounter for antineoplastic immunotherapy: Secondary | ICD-10-CM | POA: Insufficient documentation

## 2019-12-20 LAB — CBC WITH DIFFERENTIAL/PLATELET
Abs Immature Granulocytes: 0.01 10*3/uL (ref 0.00–0.07)
Basophils Absolute: 0.1 10*3/uL (ref 0.0–0.1)
Basophils Relative: 1 %
Eosinophils Absolute: 0.2 10*3/uL (ref 0.0–0.5)
Eosinophils Relative: 3 %
HCT: 38.5 % (ref 36.0–46.0)
Hemoglobin: 12.1 g/dL (ref 12.0–15.0)
Immature Granulocytes: 0 %
Lymphocytes Relative: 22 %
Lymphs Abs: 1.5 10*3/uL (ref 0.7–4.0)
MCH: 27.4 pg (ref 26.0–34.0)
MCHC: 31.4 g/dL (ref 30.0–36.0)
MCV: 87.3 fL (ref 80.0–100.0)
Monocytes Absolute: 0.7 10*3/uL (ref 0.1–1.0)
Monocytes Relative: 10 %
Neutro Abs: 4.5 10*3/uL (ref 1.7–7.7)
Neutrophils Relative %: 64 %
Platelets: 201 10*3/uL (ref 150–400)
RBC: 4.41 MIL/uL (ref 3.87–5.11)
RDW: 14.6 % (ref 11.5–15.5)
WBC: 7.1 10*3/uL (ref 4.0–10.5)
nRBC: 0 % (ref 0.0–0.2)

## 2019-12-20 LAB — COMPREHENSIVE METABOLIC PANEL
ALT: 14 U/L (ref 0–44)
AST: 16 U/L (ref 15–41)
Albumin: 4.2 g/dL (ref 3.5–5.0)
Alkaline Phosphatase: 110 U/L (ref 38–126)
Anion gap: 12 (ref 5–15)
BUN: 12 mg/dL (ref 8–23)
CO2: 21 mmol/L — ABNORMAL LOW (ref 22–32)
Calcium: 9.2 mg/dL (ref 8.9–10.3)
Chloride: 108 mmol/L (ref 98–111)
Creatinine, Ser: 1.11 mg/dL — ABNORMAL HIGH (ref 0.44–1.00)
GFR calc Af Amer: 57 mL/min — ABNORMAL LOW (ref >60)
GFR calc non Af Amer: 49 mL/min — ABNORMAL LOW (ref >60)
Glucose, Bld: 114 mg/dL — ABNORMAL HIGH (ref 70–99)
Potassium: 4.4 mmol/L (ref 3.5–5.1)
Sodium: 141 mmol/L (ref 135–145)
Total Bilirubin: 0.3 mg/dL (ref 0.3–1.2)
Total Protein: 6.6 g/dL (ref 6.5–8.1)

## 2019-12-20 LAB — LACTATE DEHYDROGENASE: LDH: 193 U/L — ABNORMAL HIGH (ref 98–192)

## 2019-12-20 MED ORDER — SODIUM CHLORIDE 0.9 % IV SOLN
Freq: Once | INTRAVENOUS | Status: AC
  Filled 2019-12-20: qty 250

## 2019-12-20 MED ORDER — SODIUM CHLORIDE 0.9 % IV SOLN
10.0000 mg | Freq: Once | INTRAVENOUS | Status: AC
  Administered 2019-12-20: 12:00:00 10 mg via INTRAVENOUS
  Filled 2019-12-20: qty 10

## 2019-12-20 MED ORDER — ACETAMINOPHEN 325 MG PO TABS
ORAL_TABLET | ORAL | Status: AC
  Filled 2019-12-20: qty 2

## 2019-12-20 MED ORDER — DIPHENHYDRAMINE HCL 25 MG PO CAPS
ORAL_CAPSULE | ORAL | Status: AC
  Filled 2019-12-20: qty 1

## 2019-12-20 MED ORDER — ACETAMINOPHEN 325 MG PO TABS
650.0000 mg | ORAL_TABLET | Freq: Once | ORAL | Status: AC
  Administered 2019-12-20: 12:00:00 650 mg via ORAL

## 2019-12-20 MED ORDER — SODIUM CHLORIDE 0.9 % IV SOLN
375.0000 mg/m2 | Freq: Once | INTRAVENOUS | Status: AC
  Administered 2019-12-20: 800 mg via INTRAVENOUS
  Filled 2019-12-20: qty 30

## 2019-12-20 MED ORDER — DIPHENHYDRAMINE HCL 25 MG PO CAPS
25.0000 mg | ORAL_CAPSULE | Freq: Once | ORAL | Status: AC
  Administered 2019-12-20: 25 mg via ORAL

## 2019-12-20 NOTE — Patient Instructions (Signed)
[facility] Discharge Instructions for Patients Receiving Chemotherapy  Today you received the following chemotherapy agents: rituximab.  To help prevent nausea and vomiting after your treatment, we encourage you to take your nausea medication as directed.   If you develop nausea and vomiting that is not controlled by your nausea medication, call the clinic.   BELOW ARE SYMPTOMS THAT SHOULD BE REPORTED IMMEDIATELY:  *FEVER GREATER THAN 100.5 F  *CHILLS WITH OR WITHOUT FEVER  NAUSEA AND VOMITING THAT IS NOT CONTROLLED WITH YOUR NAUSEA MEDICATION  *UNUSUAL SHORTNESS OF BREATH  *UNUSUAL BRUISING OR BLEEDING  TENDERNESS IN MOUTH AND THROAT WITH OR WITHOUT PRESENCE OF ULCERS  *URINARY PROBLEMS  *BOWEL PROBLEMS  UNUSUAL RASH Items with * indicate a potential emergency and should be followed up as soon as possible.  Feel free to call the clinic should you have any questions or concerns. The clinic phone number is [phone].  Please show the CHEMO ALERT CARD at check-in to the Emergency Department and triage nurse.   

## 2019-12-20 NOTE — Progress Notes (Signed)
Pharmacist Chemotherapy Monitoring - Follow Up Assessment    I verify that I have reviewed each item in the below checklist:  . Regimen for the patient is scheduled for the appropriate day and plan matches scheduled date. Marland Kitchen Appropriate non-routine labs are ordered dependent on drug ordered. . If applicable, additional medications reviewed and ordered per protocol based on lifetime cumulative doses and/or treatment regimen.   Plan for follow-up and/or issues identified: No . I-vent associated with next due treatment: No . MD and/or nursing notified: No  [name] [name] 12/20/2019 11:29 AM

## 2019-12-29 DIAGNOSIS — S52532A Colles' fracture of left radius, initial encounter for closed fracture: Secondary | ICD-10-CM | POA: Diagnosis not present

## 2020-01-19 DIAGNOSIS — S52532A Colles' fracture of left radius, initial encounter for closed fracture: Secondary | ICD-10-CM | POA: Diagnosis not present

## 2020-01-31 ENCOUNTER — Ambulatory Visit: Payer: Medicare Other | Admitting: Obstetrics & Gynecology

## 2020-02-02 ENCOUNTER — Other Ambulatory Visit: Payer: Self-pay | Admitting: Oncology

## 2020-02-02 DIAGNOSIS — C9110 Chronic lymphocytic leukemia of B-cell type not having achieved remission: Secondary | ICD-10-CM

## 2020-02-02 DIAGNOSIS — C50011 Malignant neoplasm of nipple and areola, right female breast: Secondary | ICD-10-CM

## 2020-02-13 NOTE — Progress Notes (Signed)
ID: [name] OB: 1946-03-14  MR#: [id number]  BPZ#:[id number]  Patient Care Team: Deland Pretty, MD as PCP - General (Internal Medicine) Jerline Pain, MD as PCP - Cardiology (Cardiology) Magrinat, Virgie Dad, MD as Consulting Physician (Oncology) Milus Banister, MD as Attending Physician (Gastroenterology) Megan Salon, MD as Consulting Physician (Gynecology) Donnie Mesa, MD as Consulting Physician (General Surgery) Noemi Chapel, NP as Nurse Practitioner Rod Can, MD as Consulting Physician (Orthopedic Surgery) Latanya Maudlin, MD as Consulting Physician (Orthopedic Surgery)   CHIEF COMPLAINT: 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   Her most recent mammogram was 04/04/2019.  It showed the breast density category B.  There was no evidence of malignancy.  She continues on rituximab, now on maintenance given every 8 weeks.  She tolerates this with no side effects that she is aware of   REVIEW OF SYSTEMS: Dartha is complaining of some chest discomfort.  She points to the epigastrium exactly where she would be having reflux symptoms which is what I believe she is actually having.  We have set her up for an EKG.  We have a preliminary that we obtained in our part of the clinic but I think there was a lead reversal in any way taking that into account it looks normal without any acute changes.  We will repeat that in the back.  Aside from this she tells me she is being lazy, that occasionally her hips hurt because of her back problems, and that she has fallen 3 times this year and she is not exactly sure why.  She fractured her left wrist.  She is followed by Dr. Gladstone Lighter for all that.     BREAST CANCER HISTORY: From the prior  summary:  Karn has a history of breast cancer dating back to 1997, [name] performed a right lumpectomy and axillary lymph node dissection for what according to the patient and her family was a stage I invasive ductal breast cancer. She received radiation adjuvantly and then took tamoxifen for 5 years  In February of 2014 she had a normal screening mammography, but in November 2014 she felt a "hard lump" in her right breast. She brought this to the attention of her urologist, Dr. Izora Gala, and he set her up for bilateral diagnostic mammography with mammography at [facility] 07/13/2013. This showed her breast density to be category B. There was no mammographic abnormality noted but ultrasonography showed a 1.4 cm irregularly shaped solid mass in the right breast at the 8:00 position. This was biopsied the same day, and showed ([id number]) an invasive ductal carcinoma, grade 2, estrogen receptor 100% positive, progesterone receptor 13% positive, with an MIB-1 of 33% and HER-2 amplification with a HER-2: CEP 17 ratio of 3.19, and a HER-2 copy number percent all of 4.15.  The patient's subsequent history is as detailed below     PAST MEDICAL HISTORY: Past Medical History:  Diagnosis Date  . Alopecia   . Anxiety   . Arthritis    "spine" (07/28/2013)  . Breast cancer ([place]) 1997; 2014   right  . CAP (community acquired pneumonia)    admission 06-04-2013, failed outpatient  . Chronic back pain    "lower back and upper neck" (07/28/2013)  . CLL (chronic lymphocytic leukemia) ([place]) 05/2013  .  DDD (degenerative disc disease)   . 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 ear   . Depression   . Diverticulosis   . Elevated LFTs   . Fibromyalgia 1978  . GERD (gastroesophageal reflux disease)   . Hematuria   . History of syncope    episode 2008--  no recurrence since  . HLD (hyperlipidemia)   . Hypothyroidism   . Kidney stones 2014  . Plantar fasciitis   . Ruptured disk    one in neck and two in back  . S/P  chemotherapy, time since greater than 12 weeks   . Sinus tachycardia    mild resting  . Skin cancer   . Stool incontinence    "at times recently"     PAST SURGICAL HISTORY: Past Surgical History:  Procedure Laterality Date  . ANTERIOR LATERAL LUMBAR FUSION 4 LEVELS Left 04/25/2016   Procedure: LEFT LUMBAR ONE-TWO, LUMBAR TWO-THREE, LUMBAR THREE-FOUR, LUMBAR FOUR-FIVE ANTERIOR LATERAL LUMBAR FUSION;  Surgeon: Earnie Larsson, MD;  Location: [facility];  Service: Neurosurgery;  Laterality: Left;  . APPENDECTOMY  1997  . APPLICATION OF ROBOTIC ASSISTANCE FOR SPINAL PROCEDURE N/A 04/06/2017   Procedure: APPLICATION OF ROBOTIC ASSISTANCE FOR SPINAL PROCEDURE;  Surgeon: Earnie Larsson, MD;  Location: [facility];  Service: Neurosurgery;  Laterality: N/A;  . BREAST BIOPSY Right 2014  . BREAST LUMPECTOMY Right 1997  . BREAST LUMPECTOMY WITH AXILLARY LYMPH NODE DISSECTION Right 1997  . CATARACT EXTRACTION, BILATERAL  2019   Dr. Kathlen Mody  . CYSTOSCOPY WITH RETROGRADE PYELOGRAM, URETEROSCOPY AND STENT PLACEMENT Bilateral 06/17/2013   Procedure: CYSTOSCOPY WITH RETROGRADE PYELOGRAM, URETEROSCOPY AND LEFT DOUBLE  J STENT PLACEMENT RIGHT URETERAL HOLMIIUM LASER AND DOUBLE J STENT ;  Surgeon: Hanley Ben, MD;  Location: [place] [facility];  Service: Urology;  Laterality: Bilateral;  . HOLMIUM LASER APPLICATION Bilateral 02/58/5277   Procedure: HOLMIUM LASER APPLICATION;  Surgeon: Hanley Ben, MD;  Location: [place];  Service: Urology;  Laterality: Bilateral;  . LITHOTRIPSY Left 06/2013  . LUMBAR EPIDURAL INJECTION     has had 7 injections  . PORT-A-CATH REMOVAL Left 10/03/2014   Procedure: REMOVAL PORT-A-CATH;  Surgeon: Donnie Mesa, MD;  Location: [place] [facility];  Service: General;  Laterality: Left;  . PORTACATH PLACEMENT Left 07/28/2013   Procedure: ATTEMPTED INSERTION PORT-A-CATH;  Surgeon: Imogene Burn. Georgette Dover, MD;  Location: [place];  Service: General;  Laterality: Left;   . POSTERIOR LUMBAR FUSION 4 LEVEL N/A 04/25/2016   Procedure: LUMBAR FIVE-SACRAL ONE POSTERIOR LUMBAR INTERBODY FUSION, THORACIC NINE-SACRAL ONE POSTERIOR LATERAL ARTHRODESIS WITH PEDICLE SCREWS;  Surgeon: Earnie Larsson, MD;  Location: [facility];  Service: Neurosurgery;  Laterality: N/A;  . RETINAL DETACHMENT SURGERY Right 2013  . ROBOTIC ASSITED PARTIAL NEPHRECTOMY Right 02/05/2015   Procedure: ROBOTIC ASSITED PARTIAL NEPHRECTOMY;  Surgeon: Raynelle Bring, MD;  Location: [facility];  Service: Urology;  Laterality: Right;  . SIMPLE MASTECTOMY WITH AXILLARY SENTINEL NODE BIOPSY Right 07/28/2013   Procedure: RIGHT TOTAL  MASTECTOMY;  Surgeon: Imogene Burn. Georgette Dover, MD;  Location: [place];  Service: General;  Laterality: Right;  . TONSILLECTOMY  AGE 53  . TOTAL ABDOMINAL HYSTERECTOMY W/ BILATERAL SALPINGOOPHORECTOMY  1997    FAMILY HISTORY Family History  Problem Relation Age of Onset  . Heart disease Maternal Grandfather   . Diabetes Maternal Grandfather   . Colon cancer Maternal Aunt 79  . Brain cancer Paternal Grandmother        dx in 9s  . Dementia Mother   . Diabetes  Mother   . Osteoporosis Mother   . Diabetes Maternal Aunt   . Prostate cancer Father 3  . Bipolar disorder Maternal Aunt   . Stroke Maternal Aunt   . Stomach cancer Paternal Uncle        dx in late 74s   the patient's mother died at the age of 45. The patient's father is alive at age 53. She had no brothers or sisters. Her father has a history of prostate cancer. There is no history of breast or ovarian cancer in the family. One maternal first cousin has a history of Hodgkin's lymphoma.   GYNECOLOGIC HISTORY:  Menarche age 67, first live birth age 51. She is GX P1. She underwent total abdominal hysterectomy and bilateral salpingo-oophorectomy in 1997 she did not take hormone replacement.   SOCIAL HISTORY: (Updated  11/15/2013)  She is a homemaker. Her husband Arnette Norris farms approximately 500 acres, including quite a bit of  tobacco. Daughter Colletta [place] lives in [place] where she works as a Pension scheme manager for a drug company. The patient has no grandchildren. She has two "grand cats". She attends a local [employer] church of [place]: Not in place   HEALTH MAINTENANCE:  Social History   Tobacco Use  . Smoking status: Never Smoker  . Smokeless tobacco: Never Used  Vaping Use  . Vaping Use: Never used  Substance Use Topics  . Alcohol use: No    Comment: 07/28/2013 "no alcohol since 1994; never had problem w/it"  . Drug use: No     Colonoscopy: 2009/ [facility]  PAP: Status post hysterectomy  Bone density: May 10/14/2009 at [facility] was normal  Lipid panel:  Not on file   Allergies  Allergen Reactions  . Lasix [Furosemide] Nausea And Vomiting and Other (See Comments)    HEADACHE  . Lithium Other (See Comments)    Dizziness, "cause me to fall"  . Sulfa Antibiotics Hives  . Trazodone And Nefazodone Other (See Comments)    insomnia  . Lyrica [Pregabalin] Diarrhea, Nausea Only and Rash    Current Outpatient Medications  Medication Sig Dispense Refill  . allopurinol (ZYLOPRIM) 300 MG tablet Take 1 tablet (300 mg total) by mouth daily. 90 tablet 4  . amitriptyline (ELAVIL) 100 MG tablet Take 100 mg by mouth at bedtime.    Marland Kitchen buPROPion (WELLBUTRIN XL) 300 MG 24 hr tablet Take 300 mg by mouth daily.    . DULoxetine (CYMBALTA) 60 MG capsule Take 60 mg by mouth daily.    Marland Kitchen levothyroxine (SYNTHROID, LEVOTHROID) 88 MCG tablet Take 88 mcg by mouth daily before breakfast.   4  . metoprolol tartrate (LOPRESSOR) 100 MG tablet Take 1 tablet (100 mg) by mouth once 2 hours prior to procedure 1 tablet 0  . omeprazole (PRILOSEC) 40 MG capsule TAKE 1 CAPSULE(40 MG) BY MOUTH DAILY AFTER BREAKFAST 90 capsule 0  . REXULTI 2 MG TABS Take 2 mg by mouth at bedtime.   1  . rosuvastatin (CRESTOR) 5 MG tablet Take 1 tablet (5 mg total) by mouth daily. 90 tablet 3   No current  facility-administered medications for this visit.    OBJECTIVE: white woman in no acute distress  Vitals:   02/14/20 1014  BP: 137/87  Pulse: 92  Resp: 18  Temp: 98 F (36.7 C)  SpO2: 98%   Wt Readings from Last 3 Encounters:  02/14/20 194 lb 4.8 oz (88.1 kg)  10/25/19 198 lb 3.2 oz (89.9 kg)  09/02/19  189 lb (85.7 kg)   Body mass index is 32.33 kg/m.    ECOG FS:1 - Symptomatic but completely ambulatory  Sclerae unicteric, EOMs intact Wearing a mask No cervical or supraclavicular adenopathy Lungs no rales or rhonchi Heart regular rate and rhythm Abd soft, nontender, positive bowel sounds MSK no focal spinal tenderness, no upper extremity lymphedema Neuro: nonfocal, well oriented, appropriate affect Breasts: Status post right mastectomy.   LAB RESULTS:  Lab Results  Component Value Date   WBC 6.0 02/14/2020   NEUTROABS 3.4 02/14/2020   HGB 12.2 02/14/2020   HCT 38.7 02/14/2020   MCV 88.6 02/14/2020   PLT 211 02/14/2020      Chemistry      Component Value Date/Time   NA 139 02/14/2020 0938   NA 140 03/25/2017 1300   K 4.3 02/14/2020 0938   K 4.2 03/25/2017 1300   CL 108 02/14/2020 0938   CO2 21 (L) 02/14/2020 0938   CO2 24 03/25/2017 1300   BUN 16 02/14/2020 0938   BUN 17.9 03/25/2017 1300   CREATININE 1.02 (H) 02/14/2020 0938   CREATININE 0.89 08/17/2019 1052   CREATININE 1.1 03/25/2017 1300      Component Value Date/Time   CALCIUM 9.8 02/14/2020 0938   CALCIUM 9.4 03/25/2017 1300   ALKPHOS 111 02/14/2020 0938   ALKPHOS 102 03/25/2017 1300   AST 19 02/14/2020 0938   AST 18 08/17/2019 1052   AST 41 (H) 03/25/2017 1300   ALT 19 02/14/2020 0938   ALT 12 08/17/2019 1052   ALT 52 03/25/2017 1300   BILITOT 0.3 02/14/2020 0938   BILITOT 0.3 08/17/2019 1052   BILITOT 0.36 03/25/2017 1300      STUDIES: No results found.   ASSESSMENT: 74 y.o. BRCA negative [place] woman  (1) status post right breast lumpectomy and axillary lymph node  dissection in 1997 for a stage I breast cancer, treated with adjuvant radiation and tamoxifen for 5 years  (2) status post right breast lower outer quadrant biopsy 07/13/2013 for a clinical T1c N0, stage IA invasive ductal carcinoma, grade 2, estrogen receptor 100% positive, progesterone receptor 13% positive, with an MIB-1 of 33%, and HER-2 amplification by CISH with a HER2/CEP 17 ratio of 3.19, and an average HER-2 copy number per cell of 4.15  (3) status post right mastectomy 07/28/2013 for a pT1c pN0, stage IA invasive ductal carcinoma, grade 3, with close but negative margins. Prognostic panel was not repeated  (a) the patient met with Dr. Harlow Mares and has decided against reconstruction  (4) completed weekly paclitaxel x12 12/06/2913, with trastuzumab/ pertuzumab every 3 weeks; pertuzumab was held with third dose on 11/01/2013 due to diarrhea, tried a half dose on cycle 4 again with diarrhea developing  (5) trastuzumab (started 09/20/2013) continued for 1 year, last dose 09/21/2014;  (a) final echocardiogram 09/07/2014 showed an ejection fraction of 55-60%  (6) anastrozole started May 2015; completed April 2020  (a) bone density 12/15/2013 normal  (b) bone density 02/01/2016 at [facility] was normal with a T score of -1.0   OTHER PROBLEMS: (a) History of chronic lymphoid leukemia diagnosed by flow cytometry 06/29/2013, the cells being CD5, CD20 and CD23 positive, CD10 negative.   (1) right renal mass resected on 02/05/15, consisting of atypical lymphoid proliferation composed of monotonous small lymphocytes   (2) Anemia with a normal MCV and normal ferritin-- B-12 and folate normal; stable  (3)  ibrutinib 280 milligrams daily started 08/01/202020, discontinued 06/03/2019 because of concerns regarding dosing  (  4) rituximab weekly x8 started 06/15/2019 completed 08/24/2019  (5) maintenance rituximab (Q 2 months) to start 10/25/2019, to continue for 1 year   PLAN: Kimmy's counts are terrific and  she is having no symptoms related to her chronic lymphoid leukemia or its treatment.  We are proceeding with rituximab today.  I am going to try to broaden the treatment interval to 3 months so her next treatment will be in September not August.  I think the chest pain that she was complaining about is really going to be reflux.  She is supposedly on omeprazole and she says she is taking it.  We are trying to obtain an electrocardiogram but were not successful at obtaining a reliable 1 in the clinic area.  What I see in this reading is nothing acute.  We will repeated in the back while she is getting her right toxin.  I am also suggesting that she receive a dose of Mylanta  She knows to call for any other issue that may develop before the next visit  Total encounter time 35 minutes.Sarajane Jews C. Magrinat, MD  02/14/20 11:24 AM Medical Oncology and Hematology [facility] [place], [place] [address] Tel. [phone]    Fax. [phone]   I, Wilburn Mylar, am acting as scribe for Dr. Virgie Dad. Magrinat.  I, Lurline Del MD, have reviewed the above documentation for accuracy and completeness, and I agree with the above.   *Total Encounter Time as defined by the Centers for Medicare and Medicaid Services includes, in addition to the face-to-face time of a patient visit (documented in the note above) non-face-to-face time: obtaining and reviewing outside history, ordering and reviewing medications, tests or procedures, care coordination (communications with other health care professionals or caregivers) and documentation in the medical record.

## 2020-02-14 ENCOUNTER — Inpatient Hospital Stay: Payer: Medicare Other | Attending: Oncology

## 2020-02-14 ENCOUNTER — Inpatient Hospital Stay: Payer: Medicare Other | Admitting: Oncology

## 2020-02-14 ENCOUNTER — Other Ambulatory Visit: Payer: Self-pay

## 2020-02-14 ENCOUNTER — Other Ambulatory Visit: Payer: Medicare Other

## 2020-02-14 ENCOUNTER — Other Ambulatory Visit: Payer: Self-pay | Admitting: *Deleted

## 2020-02-14 ENCOUNTER — Inpatient Hospital Stay: Payer: Medicare Other

## 2020-02-14 VITALS — BP 137/87 | HR 92 | Temp 98.0°F | Resp 18 | Ht 65.0 in | Wt 194.3 lb

## 2020-02-14 VITALS — BP 113/62 | HR 80 | Temp 98.6°F | Resp 18

## 2020-02-14 DIAGNOSIS — Z853 Personal history of malignant neoplasm of breast: Secondary | ICD-10-CM | POA: Insufficient documentation

## 2020-02-14 DIAGNOSIS — C9110 Chronic lymphocytic leukemia of B-cell type not having achieved remission: Secondary | ICD-10-CM

## 2020-02-14 DIAGNOSIS — C50511 Malignant neoplasm of lower-outer quadrant of right female breast: Secondary | ICD-10-CM

## 2020-02-14 DIAGNOSIS — Z170 Estrogen receptor positive status [ER+]: Secondary | ICD-10-CM | POA: Insufficient documentation

## 2020-02-14 DIAGNOSIS — Z9011 Acquired absence of right breast and nipple: Secondary | ICD-10-CM | POA: Diagnosis not present

## 2020-02-14 DIAGNOSIS — Z5112 Encounter for antineoplastic immunotherapy: Secondary | ICD-10-CM | POA: Diagnosis not present

## 2020-02-14 LAB — COMPREHENSIVE METABOLIC PANEL
ALT: 19 U/L (ref 0–44)
AST: 19 U/L (ref 15–41)
Albumin: 4.4 g/dL (ref 3.5–5.0)
Alkaline Phosphatase: 111 U/L (ref 38–126)
Anion gap: 10 (ref 5–15)
BUN: 16 mg/dL (ref 8–23)
CO2: 21 mmol/L — ABNORMAL LOW (ref 22–32)
Calcium: 9.8 mg/dL (ref 8.9–10.3)
Chloride: 108 mmol/L (ref 98–111)
Creatinine, Ser: 1.02 mg/dL — ABNORMAL HIGH (ref 0.44–1.00)
GFR calc Af Amer: >60 mL/min (ref >60)
GFR calc non Af Amer: 54 mL/min — ABNORMAL LOW (ref >60)
Glucose, Bld: 95 mg/dL (ref 70–99)
Potassium: 4.3 mmol/L (ref 3.5–5.1)
Sodium: 139 mmol/L (ref 135–145)
Total Bilirubin: 0.3 mg/dL (ref 0.3–1.2)
Total Protein: 6.8 g/dL (ref 6.5–8.1)

## 2020-02-14 LAB — CBC WITH DIFFERENTIAL/PLATELET
Abs Immature Granulocytes: 0.01 10*3/uL (ref 0.00–0.07)
Basophils Absolute: 0.1 10*3/uL (ref 0.0–0.1)
Basophils Relative: 1 %
Eosinophils Absolute: 0.3 10*3/uL (ref 0.0–0.5)
Eosinophils Relative: 5 %
HCT: 38.7 % (ref 36.0–46.0)
Hemoglobin: 12.2 g/dL (ref 12.0–15.0)
Immature Granulocytes: 0 %
Lymphocytes Relative: 27 %
Lymphs Abs: 1.6 10*3/uL (ref 0.7–4.0)
MCH: 27.9 pg (ref 26.0–34.0)
MCHC: 31.5 g/dL (ref 30.0–36.0)
MCV: 88.6 fL (ref 80.0–100.0)
Monocytes Absolute: 0.7 10*3/uL (ref 0.1–1.0)
Monocytes Relative: 11 %
Neutro Abs: 3.4 10*3/uL (ref 1.7–7.7)
Neutrophils Relative %: 56 %
Platelets: 211 10*3/uL (ref 150–400)
RBC: 4.37 MIL/uL (ref 3.87–5.11)
RDW: 14.9 % (ref 11.5–15.5)
WBC: 6 10*3/uL (ref 4.0–10.5)
nRBC: 0 % (ref 0.0–0.2)

## 2020-02-14 LAB — LACTATE DEHYDROGENASE: LDH: 193 U/L — ABNORMAL HIGH (ref 98–192)

## 2020-02-14 MED ORDER — DIPHENHYDRAMINE HCL 25 MG PO CAPS
ORAL_CAPSULE | ORAL | Status: AC
  Filled 2020-02-14: qty 1

## 2020-02-14 MED ORDER — DIPHENHYDRAMINE HCL 25 MG PO CAPS
25.0000 mg | ORAL_CAPSULE | Freq: Once | ORAL | Status: AC
  Administered 2020-02-14: 25 mg via ORAL

## 2020-02-14 MED ORDER — ACETAMINOPHEN 325 MG PO TABS
ORAL_TABLET | ORAL | Status: AC
  Filled 2020-02-14: qty 2

## 2020-02-14 MED ORDER — SODIUM CHLORIDE 0.9 % IV SOLN
10.0000 mg | Freq: Once | INTRAVENOUS | Status: AC
  Administered 2020-02-14: 10 mg via INTRAVENOUS
  Filled 2020-02-14: qty 10

## 2020-02-14 MED ORDER — ALUM & MAG HYDROXIDE-SIMETH 200-200-20 MG/5ML PO SUSP: 30.0000 mL | Freq: Once | ORAL | Status: DC

## 2020-02-14 MED ORDER — ACETAMINOPHEN 325 MG PO TABS
650.0000 mg | ORAL_TABLET | Freq: Once | ORAL | Status: AC
  Administered 2020-02-14: 650 mg via ORAL

## 2020-02-14 MED ORDER — SODIUM CHLORIDE 0.9 % IV SOLN
Freq: Once | INTRAVENOUS | Status: AC
  Filled 2020-02-14: qty 250

## 2020-02-14 MED ORDER — SODIUM CHLORIDE 0.9 % IV SOLN
375.0000 mg/m2 | Freq: Once | INTRAVENOUS | Status: AC
  Administered 2020-02-14: 800 mg via INTRAVENOUS
  Filled 2020-02-14: qty 50

## 2020-02-14 NOTE — Progress Notes (Signed)
EKG repeated per MD Magrinat. No active pain at this time

## 2020-02-14 NOTE — Patient Instructions (Signed)
[facility] Discharge Instructions for Patients Receiving Chemotherapy  Today you received the following chemotherapy agents Rituximab.  To help prevent nausea and vomiting after your treatment, we encourage you to take your nausea medication as directed.   If you develop nausea and vomiting that is not controlled by your nausea medication, call the clinic.   BELOW ARE SYMPTOMS THAT SHOULD BE REPORTED IMMEDIATELY:  *FEVER GREATER THAN 100.5 F  *CHILLS WITH OR WITHOUT FEVER  NAUSEA AND VOMITING THAT IS NOT CONTROLLED WITH YOUR NAUSEA MEDICATION  *UNUSUAL SHORTNESS OF BREATH  *UNUSUAL BRUISING OR BLEEDING  TENDERNESS IN MOUTH AND THROAT WITH OR WITHOUT PRESENCE OF ULCERS  *URINARY PROBLEMS  *BOWEL PROBLEMS  UNUSUAL RASH Items with * indicate a potential emergency and should be followed up as soon as possible.  Feel free to call the clinic you have any questions or concerns. The clinic phone number is [phone].  Please show the CHEMO ALERT CARD at check-in to the Emergency Department and triage nurse.    

## 2020-02-15 ENCOUNTER — Telehealth: Payer: Self-pay | Admitting: Oncology

## 2020-02-15 NOTE — Telephone Encounter (Signed)
Scheduled appts per 6/22 los. Pt confirmed appt date and time.

## 2020-02-17 LAB — BETA 2 MICROGLOBULIN, SERUM: Beta-2 Microglobulin: 3 mg/L — ABNORMAL HIGH (ref 0.6–2.4)

## 2020-03-16 DIAGNOSIS — F411 Generalized anxiety disorder: Secondary | ICD-10-CM | POA: Diagnosis not present

## 2020-03-16 DIAGNOSIS — F331 Major depressive disorder, recurrent, moderate: Secondary | ICD-10-CM | POA: Diagnosis not present

## 2020-05-03 DIAGNOSIS — Z1231 Encounter for screening mammogram for malignant neoplasm of breast: Secondary | ICD-10-CM | POA: Diagnosis not present

## 2020-05-04 ENCOUNTER — Other Ambulatory Visit: Payer: Self-pay | Admitting: *Deleted

## 2020-05-04 DIAGNOSIS — C50511 Malignant neoplasm of lower-outer quadrant of right female breast: Secondary | ICD-10-CM

## 2020-05-07 ENCOUNTER — Inpatient Hospital Stay: Payer: Medicare Other | Admitting: Oncology

## 2020-05-07 ENCOUNTER — Inpatient Hospital Stay: Payer: Medicare Other

## 2020-05-10 ENCOUNTER — Other Ambulatory Visit: Payer: Self-pay | Admitting: Oncology

## 2020-05-10 DIAGNOSIS — C9110 Chronic lymphocytic leukemia of B-cell type not having achieved remission: Secondary | ICD-10-CM

## 2020-05-10 DIAGNOSIS — C50011 Malignant neoplasm of nipple and areola, right female breast: Secondary | ICD-10-CM

## 2020-05-11 ENCOUNTER — Other Ambulatory Visit: Payer: Self-pay | Admitting: *Deleted

## 2020-05-11 ENCOUNTER — Inpatient Hospital Stay: Payer: Medicare Other | Attending: Oncology

## 2020-05-11 ENCOUNTER — Inpatient Hospital Stay: Payer: Medicare Other

## 2020-05-11 ENCOUNTER — Inpatient Hospital Stay (HOSPITAL_BASED_OUTPATIENT_CLINIC_OR_DEPARTMENT_OTHER): Payer: Medicare Other | Admitting: Oncology

## 2020-05-11 ENCOUNTER — Telehealth: Payer: Self-pay | Admitting: Oncology

## 2020-05-11 ENCOUNTER — Other Ambulatory Visit: Payer: Self-pay

## 2020-05-11 VITALS — BP 123/66 | HR 99 | Temp 97.4°F | Resp 18 | Ht 65.0 in | Wt 199.6 lb

## 2020-05-11 VITALS — BP 117/72 | HR 86 | Temp 98.6°F | Resp 16

## 2020-05-11 DIAGNOSIS — K5900 Constipation, unspecified: Secondary | ICD-10-CM | POA: Insufficient documentation

## 2020-05-11 DIAGNOSIS — Z5112 Encounter for antineoplastic immunotherapy: Secondary | ICD-10-CM | POA: Insufficient documentation

## 2020-05-11 DIAGNOSIS — Z853 Personal history of malignant neoplasm of breast: Secondary | ICD-10-CM | POA: Diagnosis not present

## 2020-05-11 DIAGNOSIS — Z807 Family history of other malignant neoplasms of lymphoid, hematopoietic and related tissues: Secondary | ICD-10-CM | POA: Diagnosis not present

## 2020-05-11 DIAGNOSIS — Z85828 Personal history of other malignant neoplasm of skin: Secondary | ICD-10-CM | POA: Insufficient documentation

## 2020-05-11 DIAGNOSIS — C50511 Malignant neoplasm of lower-outer quadrant of right female breast: Secondary | ICD-10-CM

## 2020-05-11 DIAGNOSIS — Z170 Estrogen receptor positive status [ER+]: Secondary | ICD-10-CM | POA: Diagnosis not present

## 2020-05-11 DIAGNOSIS — C9110 Chronic lymphocytic leukemia of B-cell type not having achieved remission: Secondary | ICD-10-CM

## 2020-05-11 DIAGNOSIS — Z800 Family history of malignant neoplasm of digestive organs: Secondary | ICD-10-CM | POA: Insufficient documentation

## 2020-05-11 DIAGNOSIS — Z9011 Acquired absence of right breast and nipple: Secondary | ICD-10-CM | POA: Diagnosis not present

## 2020-05-11 DIAGNOSIS — Z808 Family history of malignant neoplasm of other organs or systems: Secondary | ICD-10-CM | POA: Diagnosis not present

## 2020-05-11 LAB — CBC WITH DIFFERENTIAL (CANCER CENTER ONLY)
Abs Immature Granulocytes: 0.05 10*3/uL (ref 0.00–0.07)
Basophils Absolute: 0.1 10*3/uL (ref 0.0–0.1)
Basophils Relative: 1 %
Eosinophils Absolute: 0.4 10*3/uL (ref 0.0–0.5)
Eosinophils Relative: 4 %
HCT: 38 % (ref 36.0–46.0)
Hemoglobin: 12.1 g/dL (ref 12.0–15.0)
Immature Granulocytes: 1 %
Lymphocytes Relative: 30 %
Lymphs Abs: 2.7 10*3/uL (ref 0.7–4.0)
MCH: 28.5 pg (ref 26.0–34.0)
MCHC: 31.8 g/dL (ref 30.0–36.0)
MCV: 89.6 fL (ref 80.0–100.0)
Monocytes Absolute: 0.9 10*3/uL (ref 0.1–1.0)
Monocytes Relative: 10 %
Neutro Abs: 4.9 10*3/uL (ref 1.7–7.7)
Neutrophils Relative %: 54 %
Platelet Count: 161 10*3/uL (ref 150–400)
RBC: 4.24 MIL/uL (ref 3.87–5.11)
RDW: 13.9 % (ref 11.5–15.5)
WBC Count: 9.1 10*3/uL (ref 4.0–10.5)
nRBC: 0 % (ref 0.0–0.2)

## 2020-05-11 LAB — CMP (CANCER CENTER ONLY)
ALT: 23 U/L (ref 0–44)
AST: 21 U/L (ref 15–41)
Albumin: 4.2 g/dL (ref 3.5–5.0)
Alkaline Phosphatase: 112 U/L (ref 38–126)
Anion gap: 13 (ref 5–15)
BUN: 10 mg/dL (ref 8–23)
CO2: 18 mmol/L — ABNORMAL LOW (ref 22–32)
Calcium: 9.3 mg/dL (ref 8.9–10.3)
Chloride: 110 mmol/L (ref 98–111)
Creatinine: 1.03 mg/dL — ABNORMAL HIGH (ref 0.44–1.00)
GFR, Est AFR Am: >60 mL/min (ref >60)
GFR, Estimated: 54 mL/min — ABNORMAL LOW (ref >60)
Glucose, Bld: 102 mg/dL — ABNORMAL HIGH (ref 70–99)
Potassium: 4.4 mmol/L (ref 3.5–5.1)
Sodium: 141 mmol/L (ref 135–145)
Total Bilirubin: 0.3 mg/dL (ref 0.3–1.2)
Total Protein: 6.7 g/dL (ref 6.5–8.1)

## 2020-05-11 LAB — LACTATE DEHYDROGENASE: LDH: 210 U/L — ABNORMAL HIGH (ref 98–192)

## 2020-05-11 LAB — SAMPLE TO BLOOD BANK

## 2020-05-11 MED ORDER — SODIUM CHLORIDE 0.9 % IV SOLN
10.0000 mg | Freq: Once | INTRAVENOUS | Status: AC
  Administered 2020-05-11: 10 mg via INTRAVENOUS
  Filled 2020-05-11: qty 10

## 2020-05-11 MED ORDER — SODIUM CHLORIDE 0.9 % IV SOLN
375.0000 mg/m2 | Freq: Once | INTRAVENOUS | Status: AC
  Administered 2020-05-11: 800 mg via INTRAVENOUS
  Filled 2020-05-11: qty 50

## 2020-05-11 MED ORDER — DIPHENHYDRAMINE HCL 25 MG PO CAPS
ORAL_CAPSULE | ORAL | Status: AC
  Filled 2020-05-11: qty 1

## 2020-05-11 MED ORDER — ACETAMINOPHEN 325 MG PO TABS
650.0000 mg | ORAL_TABLET | Freq: Once | ORAL | Status: AC
  Administered 2020-05-11: 650 mg via ORAL

## 2020-05-11 MED ORDER — ACETAMINOPHEN 325 MG PO TABS
ORAL_TABLET | ORAL | Status: AC
  Filled 2020-05-11: qty 2

## 2020-05-11 MED ORDER — SODIUM CHLORIDE 0.9 % IV SOLN
Freq: Once | INTRAVENOUS | Status: AC
  Filled 2020-05-11: qty 250

## 2020-05-11 MED ORDER — DIPHENHYDRAMINE HCL 25 MG PO CAPS
25.0000 mg | ORAL_CAPSULE | Freq: Once | ORAL | Status: AC
  Administered 2020-05-11: 25 mg via ORAL

## 2020-05-11 NOTE — Patient Instructions (Signed)
[facility] Discharge Instructions for Patients Receiving Chemotherapy  Today you received the following chemotherapy agents Rituximab.  To help prevent nausea and vomiting after your treatment, we encourage you to take your nausea medication as directed.   If you develop nausea and vomiting that is not controlled by your nausea medication, call the clinic.   BELOW ARE SYMPTOMS THAT SHOULD BE REPORTED IMMEDIATELY:  *FEVER GREATER THAN 100.5 F  *CHILLS WITH OR WITHOUT FEVER  NAUSEA AND VOMITING THAT IS NOT CONTROLLED WITH YOUR NAUSEA MEDICATION  *UNUSUAL SHORTNESS OF BREATH  *UNUSUAL BRUISING OR BLEEDING  TENDERNESS IN MOUTH AND THROAT WITH OR WITHOUT PRESENCE OF ULCERS  *URINARY PROBLEMS  *BOWEL PROBLEMS  UNUSUAL RASH Items with * indicate a potential emergency and should be followed up as soon as possible.  Feel free to call the clinic you have any questions or concerns. The clinic phone number is [phone].  Please show the CHEMO ALERT CARD at check-in to the Emergency Department and triage nurse.    

## 2020-05-11 NOTE — Progress Notes (Signed)
ID: [name] [name] OB: [birth year]  MR#: [id number]  SAY#:[id number]  Patient Care Team: Deland Pretty, MD as PCP - General (Internal Medicine) Jerline Pain, MD as PCP - Cardiology (Cardiology) Micheala Morissette, Virgie Dad, MD as Consulting Physician (Oncology) Milus Banister, MD as Attending Physician (Gastroenterology) Megan Salon, MD as Consulting Physician (Gynecology) Donnie Mesa, MD as Consulting Physician (General Surgery) Noemi Chapel, NP as Nurse Practitioner Rod Can, MD as Consulting Physician (Orthopedic Surgery) Latanya Maudlin, MD as Consulting Physician (Orthopedic Surgery) Ria Clock, MD as Attending Physician (Radiology) Adele Dan, MD as Referring Physician (Orthopedic Surgery)   CHIEF COMPLAINT: 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   She continues on rituximab, now on maintenance given every 8 weeks.  She tolerates this with no side effects that she is aware of  She had routine left screening mammography at [facility] 05/03/2020 showing breast density category B.  The breast looks fine.  There were some left axillary lymph nodes which are going to be related to her chronic lymphoid leukemia and not to her breast cancer which was on the opposite side.  REVIEW OF SYSTEMS: [name] [name] has significant upper back pain.  She is followed by Dr. Trenton Gammon for this.  She has shortness of breath which is stable, not accompanied by angina, cough, phlegm, or pleurisy, and may be consistent with deconditioning.  She is minimally constipated.  She has had no fever rash or bleeding, no unexplained weight loss, and no adenopathy although she thinks there may be a little something in the back of her neck on the left side she wanted me to  check today       BREAST CANCER HISTORY: From the prior summary:  [name] [name] has a history of breast cancer dating back to 1997, [name] [name] performed a right lumpectomy and axillary lymph node dissection for what according to the patient and her family was a stage I invasive ductal breast cancer. She received radiation adjuvantly and then took tamoxifen for 5 years  In February of 2014 she had a normal screening mammography, but in November 2014 she felt a "hard lump" in her right breast. She brought this to the attention of her urologist, Dr. Izora Gala, and he set her up for bilateral diagnostic mammography with mammography at [facility] 07/13/2013. This showed her breast density to be category B. There was no mammographic abnormality noted but ultrasonography showed a 1.4 cm irregularly shaped solid mass in the right breast at the 8:00 position. This was biopsied the same day, and showed ([id number]) an invasive ductal carcinoma, grade 2, estrogen receptor 100% positive, progesterone receptor 13% positive, with an MIB-1 of 33% and HER-2 amplification with a HER-2: CEP 17 ratio of 3.19, and a HER-2 copy number percent all of 4.15.  The patient's subsequent history is as detailed below     PAST MEDICAL HISTORY: Past Medical History:  Diagnosis Date  . Alopecia   . Anxiety   . Arthritis    "spine" (07/28/2013)  . Breast cancer ([place]) 1997; 2014   right  . CAP (community acquired pneumonia)    admission 06-04-2013, failed outpatient  . Chronic back pain    "lower back and upper neck" (07/28/2013)  . CLL (chronic lymphocytic leukemia) ([place]) 05/2013  . DDD (degenerative disc disease)   .  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 ear   . Depression   . Diverticulosis   . Elevated LFTs   . Fibromyalgia 1978  . GERD (gastroesophageal reflux disease)   . Hematuria   . History of syncope    episode 2008--  no recurrence since  . HLD (hyperlipidemia)   . Hypothyroidism   . Kidney stones 2014  . Plantar  fasciitis   . Ruptured disk    one in neck and two in back  . S/P chemotherapy, time since greater than 12 weeks   . Sinus tachycardia    mild resting  . Skin cancer   . Stool incontinence    "at times recently"     PAST SURGICAL HISTORY: Past Surgical History:  Procedure Laterality Date  . ANTERIOR LATERAL LUMBAR FUSION 4 LEVELS Left 04/25/2016   Procedure: LEFT LUMBAR ONE-TWO, LUMBAR TWO-THREE, LUMBAR THREE-FOUR, LUMBAR FOUR-FIVE ANTERIOR LATERAL LUMBAR FUSION;  Surgeon: Earnie Larsson, MD;  Location: [place] [facility];  Service: Neurosurgery;  Laterality: Left;  . APPENDECTOMY  1997  . APPLICATION OF ROBOTIC ASSISTANCE FOR SPINAL PROCEDURE N/A 04/06/2017   Procedure: APPLICATION OF ROBOTIC ASSISTANCE FOR SPINAL PROCEDURE;  Surgeon: Earnie Larsson, MD;  Location: [place];  Service: Neurosurgery;  Laterality: N/A;  . BREAST BIOPSY Right 2014  . BREAST LUMPECTOMY Right 1997  . BREAST LUMPECTOMY WITH AXILLARY LYMPH NODE DISSECTION Right 1997  . CATARACT EXTRACTION, BILATERAL  2019   Dr. Kathlen Mody  . CYSTOSCOPY WITH RETROGRADE PYELOGRAM, URETEROSCOPY AND STENT PLACEMENT Bilateral 06/17/2013   Procedure: CYSTOSCOPY WITH RETROGRADE PYELOGRAM, URETEROSCOPY AND LEFT DOUBLE  J STENT PLACEMENT RIGHT URETERAL HOLMIIUM LASER AND DOUBLE J STENT ;  Surgeon: Hanley Ben, MD;  Location: [place];  Service: Urology;  Laterality: Bilateral;  . HOLMIUM LASER APPLICATION Bilateral 96/78/9381   Procedure: HOLMIUM LASER APPLICATION;  Surgeon: Hanley Ben, MD;  Location: [place];  Service: Urology;  Laterality: Bilateral;  . LITHOTRIPSY Left 06/2013  . LUMBAR EPIDURAL INJECTION     has had 7 injections  . PORT-A-CATH REMOVAL Left 10/03/2014   Procedure: REMOVAL PORT-A-CATH;  Surgeon: Donnie Mesa, MD;  Location: [place];  Service: General;  Laterality: Left;  . PORTACATH PLACEMENT Left 07/28/2013   Procedure: ATTEMPTED INSERTION PORT-A-CATH;  Surgeon: Imogene Burn.  Georgette Dover, MD;  Location: [place];  Service: General;  Laterality: Left;  . POSTERIOR LUMBAR FUSION 4 LEVEL N/A 04/25/2016   Procedure: LUMBAR FIVE-SACRAL ONE POSTERIOR LUMBAR INTERBODY FUSION, THORACIC NINE-SACRAL ONE POSTERIOR LATERAL ARTHRODESIS WITH PEDICLE SCREWS;  Surgeon: Earnie Larsson, MD;  Location: [place] [facility];  Service: Neurosurgery;  Laterality: N/A;  . RETINAL DETACHMENT SURGERY Right 2013  . ROBOTIC ASSITED PARTIAL NEPHRECTOMY Right 02/05/2015   Procedure: ROBOTIC ASSITED PARTIAL NEPHRECTOMY;  Surgeon: Raynelle Bring, MD;  [facility];  Service: Urology;  Laterality: Right;  . SIMPLE MASTECTOMY WITH AXILLARY SENTINEL NODE BIOPSY Right 07/28/2013   Procedure: RIGHT TOTAL  MASTECTOMY;  Surgeon: Imogene Burn. Georgette Dover, MD;  Location: [place];  Service: General;  Laterality: Right;  . TONSILLECTOMY  AGE 54  . TOTAL ABDOMINAL HYSTERECTOMY W/ BILATERAL SALPINGOOPHORECTOMY  1997    FAMILY HISTORY Family History  Problem Relation Age of Onset  . Heart disease Maternal Grandfather   . Diabetes Maternal Grandfather   . Colon cancer Maternal Aunt 79  . Brain cancer Paternal Grandmother        dx in 23s  . Dementia Mother   . Diabetes Mother   . Osteoporosis Mother   .  Diabetes Maternal Aunt   . Prostate cancer Father 54  . Bipolar disorder Maternal Aunt   . Stroke Maternal Aunt   . Stomach cancer Paternal Uncle        dx in late 97s   the patient's mother died at the age of 73. The patient's father is alive at age 47. She had no brothers or sisters. Her father has a history of prostate cancer. There is no history of breast or ovarian cancer in the family. One maternal first cousin has a history of Hodgkin's lymphoma.   GYNECOLOGIC HISTORY:  Menarche age 82, first live birth age 9. She is GX P1. She underwent total abdominal hysterectomy and bilateral salpingo-oophorectomy in 1997 she did not take hormone replacement.   SOCIAL HISTORY: (Updated  11/15/2013)  She is a homemaker. Her husband  Arnette Norris farms approximately 500 acres, including quite a bit of tobacco. Daughter Colletta [place] lives in [place] where she works as a Pension scheme manager for a drug company. The patient has no grandchildren. She has two "grand cats". She attends a local united church of [place]: Not in place   HEALTH MAINTENANCE:  Social History   Tobacco Use  . Smoking status: Never Smoker  . Smokeless tobacco: Never Used  Vaping Use  . Vaping Use: Never used  Substance Use Topics  . Alcohol use: No    Comment: 07/28/2013 "no alcohol since 1994; never had problem w/it"  . Drug use: No     Colonoscopy: 2009/ [facility]  PAP: Status post hysterectomy  Bone density: May 10/14/2009 at [facility] was normal  Lipid panel:  Not on file   Allergies  Allergen Reactions  . Lasix [Furosemide] Nausea And Vomiting and Other (See Comments)    HEADACHE  . Lithium Other (See Comments)    Dizziness, "cause me to fall"  . Sulfa Antibiotics Hives  . Trazodone And Nefazodone Other (See Comments)    insomnia  . Lyrica [Pregabalin] Diarrhea, Nausea Only and Rash    Current Outpatient Medications  Medication Sig Dispense Refill  . allopurinol (ZYLOPRIM) 300 MG tablet Take 1 tablet (300 mg total) by mouth daily. 90 tablet 4  . amitriptyline (ELAVIL) 100 MG tablet Take 100 mg by mouth at bedtime.    Marland Kitchen buPROPion (WELLBUTRIN XL) 300 MG 24 hr tablet Take 300 mg by mouth daily.    . DULoxetine (CYMBALTA) 60 MG capsule Take 60 mg by mouth daily.    Marland Kitchen levothyroxine (SYNTHROID, LEVOTHROID) 88 MCG tablet Take 88 mcg by mouth daily before breakfast.   4  . metoprolol tartrate (LOPRESSOR) 100 MG tablet Take 1 tablet (100 mg) by mouth once 2 hours prior to procedure 1 tablet 0  . omeprazole (PRILOSEC) 40 MG capsule TAKE 1 CAPSULE(40 MG) BY MOUTH DAILY AFTER BREAKFAST 90 capsule 0  . REXULTI 2 MG TABS Take 2 mg by mouth at bedtime.   1  . rosuvastatin (CRESTOR) 5 MG tablet Take 1 tablet (5 mg  total) by mouth daily. 90 tablet 3   No current facility-administered medications for this visit.    OBJECTIVE: white woman who appears stated age  25:   05/11/20 1050  BP: 123/66  Pulse: 99  Resp: 18  Temp: (!) 97.4 F (36.3 C)  SpO2: 97%   Wt Readings from Last 3 Encounters:  05/11/20 199 lb 9.6 oz (90.5 kg)  02/14/20 194 lb 4.8 oz (88.1 kg)  10/25/19 198 lb 3.2 oz (89.9 kg)  Body mass index is 33.22 kg/m.    ECOG FS:1 - Symptomatic but completely ambulatory  Sclerae unicteric, EOMs intact Wearing a mask No cervical or supraclavicular adenopathy and specifically I do not palpate a mass or lymph node in the left posterior cervical chain area which was her area of concern Lungs no rales or rhonchi Heart regular rate and rhythm Abd soft, nontender, positive bowel sounds MSK no focal spinal tenderness, no upper extremity lymphedema Neuro: nonfocal, well oriented, appropriate affect Breasts: Status post right mastectomy with no evidence of local recurrence.  Left breast is unremarkable.  I do not palpate any adenopathy in either axilla.   LAB RESULTS:  Lab Results  Component Value Date   WBC 9.1 05/11/2020   NEUTROABS 4.9 05/11/2020   HGB 12.1 05/11/2020   HCT 38.0 05/11/2020   MCV 89.6 05/11/2020   PLT 161 05/11/2020      Chemistry      Component Value Date/Time   NA 139 02/14/2020 0938   NA 140 03/25/2017 1300   K 4.3 02/14/2020 0938   K 4.2 03/25/2017 1300   CL 108 02/14/2020 0938   CO2 21 (L) 02/14/2020 0938   CO2 24 03/25/2017 1300   BUN 16 02/14/2020 0938   BUN 17.9 03/25/2017 1300   CREATININE 1.02 (H) 02/14/2020 0938   CREATININE 0.89 08/17/2019 1052   CREATININE 1.1 03/25/2017 1300      Component Value Date/Time   CALCIUM 9.8 02/14/2020 0938   CALCIUM 9.4 03/25/2017 1300   ALKPHOS 111 02/14/2020 0938   ALKPHOS 102 03/25/2017 1300   AST 19 02/14/2020 0938   AST 18 08/17/2019 1052   AST 41 (H) 03/25/2017 1300   ALT 19 02/14/2020 0938    ALT 12 08/17/2019 1052   ALT 52 03/25/2017 1300   BILITOT 0.3 02/14/2020 0938   BILITOT 0.3 08/17/2019 1052   BILITOT 0.36 03/25/2017 1300      STUDIES: No results found.   ASSESSMENT: 74 y.o. BRCA negative [place] woman  (1) status post right breast lumpectomy and axillary lymph node dissection in 1997 for a stage I breast cancer, treated with adjuvant radiation and tamoxifen for 5 years  (2) status post right breast lower outer quadrant biopsy 07/13/2013 for a clinical T1c N0, stage IA invasive ductal carcinoma, grade 2, estrogen receptor 100% positive, progesterone receptor 13% positive, with an MIB-1 of 33%, and HER-2 amplification by CISH with a HER2/CEP 17 ratio of 3.19, and an average HER-2 copy number per cell of 4.15  (3) status post right mastectomy 07/28/2013 for a pT1c pN0, stage IA invasive ductal carcinoma, grade 3, with close but negative margins. Prognostic panel was not repeated  (a) the patient met with Dr. Harlow Mares and has decided against reconstruction  (4) completed weekly paclitaxel x12 12/06/2913, with trastuzumab/ pertuzumab every 3 weeks; pertuzumab was held with third dose on 11/01/2013 due to diarrhea, tried a half dose on cycle 4 again with diarrhea developing  (5) trastuzumab (started 09/20/2013) continued for 1 year, last dose 09/21/2014;  (a) final echocardiogram 09/07/2014 showed an ejection fraction of 55-60%  (6) anastrozole started May 2015; completed April 2020  (a) bone density 12/15/2013 normal  (b) bone density 02/01/2016 at [facility] was normal with a T score of -1.0   OTHER PROBLEMS: (a) History of chronic lymphoid leukemia diagnosed by flow cytometry 06/29/2013, the cells being CD5, CD20 and CD23 positive, CD10 negative.   (1) right renal mass resected on 02/05/15, consisting of atypical lymphoid  proliferation composed of monotonous small lymphocytes   (2) Anemia with a normal MCV and normal ferritin-- B-12 and folate normal; stable  (3)   ibrutinib 280 milligrams daily started 08/01/202020, discontinued 06/03/2019 because of concerns regarding dosing  (4) rituximab weekly x8 started 06/15/2019 completed 08/24/2019  (5) maintenance rituximab (Q 2 months) to start 10/25/2019, to continue for 1 year   PLAN: Zyia's chronic lymphoid leukemia is very well controlled on her current treatment.  We are going to continue the rituximab every eighth 12 weeks for another year after which we will consider discontinuing it and following with observation only.  The lymph nodes noted in her left axilla are consistent with CLL and I do not believe require any further evaluation at this point  She will try MiraLAX for her constipation.  She will try to increase her exercise tolerance to see if that helps the shortness of breath problem but given her back pain and neuropathy in her feet it is going to be difficult for her to do that.  She will continue to discuss these issues with her primary care physician Dr. Shelia Media.  Otherwise she will return in 3 months for another infusion and in 6 months for an infusion and a visit.    Total encounter time 30 minutes.Sarajane Jews C. Trelyn Vanderlinde, MD  05/11/20 11:16 AM Medical Oncology and Hematology [facility] [place], [place] [address] Tel. [phone]    Fax. [phone]   I, Wilburn Mylar, am acting as scribe for Dr. Virgie Dad. River Mckercher.  I, Lurline Del MD, have reviewed the above documentation for accuracy and completeness, and I agree with the above.   *Total Encounter Time as defined by the Centers for Medicare and Medicaid Services includes, in addition to the face-to-face time of a patient visit (documented in the note above) non-face-to-face time: obtaining and reviewing outside history, ordering and reviewing medications, tests or procedures, care coordination (communications with other health care professionals or caregivers) and documentation in the medical  record.

## 2020-05-11 NOTE — Telephone Encounter (Signed)
Scheduled appts per 9/17 los. Gave pt a print out of AVS.  

## 2020-05-13 LAB — BETA 2 MICROGLOBULIN, SERUM: Beta-2 Microglobulin: 3.1 mg/L — ABNORMAL HIGH (ref 0.6–2.4)

## 2020-06-08 NOTE — Progress Notes (Signed)
74 y.o. G1P1 Married White or Caucasian female here for breast and pelvic exam.  I am also following her for history of breast cancer with recurrence in 2013.  Off anastrozole since 11/2018.  Still has hot flashes.  Cannot be on estrogen.  Is being followed by Dr. Jana Hakim for CLL.  Is receiving infusions every 3 months.  She asks me today about whether she should have a port.  D/w her the pros and cons of this.  She has questions for me about the covid booster.    Denies vaginal bleeding.  Patient's last menstrual period was 08/26/1995.          Sexually active: No.  H/O STD:  no  Health Maintenance: PCP:  Dr. Shelia Media.  Last wellness appt was 05/2020.  Did blood work at that appt:  yes Vaccines are up to date: yes except for flu shot and Covid booster Colonoscopy:  11/03/2014 follow up 10 eyars MMG:  Pt reports this was recently done.  Called for this to be faxed BMD:  2015 Last pap smear:  09/2015.   H/o abnormal pap smear:  not   reports that she has never smoked. She has never used smokeless tobacco. She reports that she does not drink alcohol and does not use drugs.  Past Medical History:  Diagnosis Date  . Alopecia   . Anxiety   . Arthritis    "spine" (07/28/2013)  . Breast cancer ([place]) 1997; 2014   right  . CAP (community acquired pneumonia)    admission 06-04-2013, failed outpatient  . Chronic back pain    "lower back and upper neck" (07/28/2013)  . CLL (chronic lymphocytic leukemia) ([place]) 05/2013  . DDD (degenerative disc disease)   . 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 ear   . Depression   . Diverticulosis   . Elevated LFTs   . Fibromyalgia 1978  . GERD (gastroesophageal reflux disease)   . Hematuria   . History of syncope    episode 2008--  no recurrence since  . HLD (hyperlipidemia)   . Hypothyroidism   . Kidney stones 2014  . Plantar fasciitis   . Ruptured disk    one in neck and two in back  . S/P chemotherapy, time since greater than 12 weeks   . Sinus tachycardia    mild  resting  . Skin cancer   . Stool incontinence    "at times recently"     Past Surgical History:  Procedure Laterality Date  . ANTERIOR LATERAL LUMBAR FUSION 4 LEVELS Left 04/25/2016   Procedure: LEFT LUMBAR ONE-TWO, LUMBAR TWO-THREE, LUMBAR THREE-FOUR, LUMBAR FOUR-FIVE ANTERIOR LATERAL LUMBAR FUSION;  Surgeon: Earnie Larsson, MD;  Location: [place] [facility];  Service: Neurosurgery;  Laterality: Left;  . APPENDECTOMY  1997  . APPLICATION OF ROBOTIC ASSISTANCE FOR SPINAL PROCEDURE N/A 04/06/2017   Procedure: APPLICATION OF ROBOTIC ASSISTANCE FOR SPINAL PROCEDURE;  Surgeon: Earnie Larsson, MD;  Location: [place];  Service: Neurosurgery;  Laterality: N/A;  . BREAST BIOPSY Right 2014  . BREAST LUMPECTOMY Right 1997  . BREAST LUMPECTOMY WITH AXILLARY LYMPH NODE DISSECTION Right 1997  . CATARACT EXTRACTION, BILATERAL  2019   Dr. Kathlen Mody  . CYSTOSCOPY WITH RETROGRADE PYELOGRAM, URETEROSCOPY AND STENT PLACEMENT Bilateral 06/17/2013   Procedure: CYSTOSCOPY WITH RETROGRADE PYELOGRAM, URETEROSCOPY AND LEFT DOUBLE  J STENT PLACEMENT RIGHT URETERAL HOLMIIUM LASER AND DOUBLE J STENT ;  Surgeon: Hanley Ben, MD;  Location: [place];  Service: Urology;  Laterality: Bilateral;  . HOLMIUM LASER APPLICATION  Bilateral 06/17/2013   Procedure: HOLMIUM LASER APPLICATION;  Surgeon: Hanley Ben, MD;  Location: [facility];  Service: Urology;  Laterality: Bilateral;  . LITHOTRIPSY Left 06/2013  . LUMBAR EPIDURAL INJECTION     has had 7 injections  . PORT-A-CATH REMOVAL Left 10/03/2014   Procedure: REMOVAL PORT-A-CATH;  Surgeon: Donnie Mesa, MD;  Location: [place];  Service: General;  Laterality: Left;  . PORTACATH PLACEMENT Left 07/28/2013   Procedure: ATTEMPTED INSERTION PORT-A-CATH;  Surgeon: Imogene Burn. Georgette Dover, MD;  Location: [place];  Service: General;  Laterality: Left;  . POSTERIOR LUMBAR FUSION 4 LEVEL N/A 04/25/2016   Procedure: LUMBAR FIVE-SACRAL ONE POSTERIOR LUMBAR  INTERBODY FUSION, THORACIC NINE-SACRAL ONE POSTERIOR LATERAL ARTHRODESIS WITH PEDICLE SCREWS;  Surgeon: Earnie Larsson, MD;  Location: [place] [facility];  Service: Neurosurgery;  Laterality: N/A;  . RETINAL DETACHMENT SURGERY Right 2013  . ROBOTIC ASSITED PARTIAL NEPHRECTOMY Right 02/05/2015   Procedure: ROBOTIC ASSITED PARTIAL NEPHRECTOMY;  Surgeon: Raynelle Bring, MD;  [facility];  Service: Urology;  Laterality: Right;  . SIMPLE MASTECTOMY WITH AXILLARY SENTINEL NODE BIOPSY Right 07/28/2013   Procedure: RIGHT TOTAL  MASTECTOMY;  Surgeon: Imogene Burn. Georgette Dover, MD;  Location: [place];  Service: General;  Laterality: Right;  . TONSILLECTOMY  AGE 34  . TOTAL ABDOMINAL HYSTERECTOMY W/ BILATERAL SALPINGOOPHORECTOMY  1997    Current Outpatient Medications  Medication Sig Dispense Refill  . allopurinol (ZYLOPRIM) 300 MG tablet Take 1 tablet (300 mg total) by mouth daily. 90 tablet 4  . amitriptyline (ELAVIL) 100 MG tablet Take 100 mg by mouth at bedtime.    Marland Kitchen buPROPion (WELLBUTRIN XL) 300 MG 24 hr tablet Take 300 mg by mouth daily.    . DULoxetine (CYMBALTA) 60 MG capsule Take 60 mg by mouth daily.    Marland Kitchen LATUDA 40 MG TABS tablet SMARTSIG:1 Tablet(s) By Mouth Every Evening    . levothyroxine (SYNTHROID, LEVOTHROID) 88 MCG tablet Take 88 mcg by mouth daily before breakfast.   4  . omeprazole (PRILOSEC) 40 MG capsule TAKE 1 CAPSULE(40 MG) BY MOUTH DAILY AFTER BREAKFAST 90 capsule 0  . metoprolol tartrate (LOPRESSOR) 100 MG tablet Take 1 tablet (100 mg) by mouth once 2 hours prior to procedure (Patient not taking: Reported on 06/12/2020) 1 tablet 0  . rosuvastatin (CRESTOR) 5 MG tablet Take 1 tablet (5 mg total) by mouth daily. 90 tablet 3   No current facility-administered medications for this visit.    Family History  Problem Relation Age of Onset  . Heart disease Maternal Grandfather   . Diabetes Maternal Grandfather   . Colon cancer Maternal Aunt 79  . Brain cancer Paternal Grandmother        dx in 26s   . Dementia Mother   . Diabetes Mother   . Osteoporosis Mother   . Diabetes Maternal Aunt   . Prostate cancer Father 58  . Bipolar disorder Maternal Aunt   . Stroke Maternal Aunt   . Stomach cancer Paternal Uncle        dx in late 16s    Review of Systems  Endocrine:       Hot flashes  All other systems reviewed and are negative.   Exam:   BP 118/78   Pulse 68   Resp 16   Ht 5' 3.25" (1.607 m)   Wt 199 lb (90.3 kg)   LMP 08/26/1995   BMI 34.97 kg/m   Height: 5' 3.25" (160.7 cm)  General appearance: alert, cooperative  and appears stated age Breasts:  Absent right breast, no mass, well healed scar, no LAD, left breast without masses, skin changes, nipple discharge, LAD Abdomen: soft, non-tender; bowel sounds normal; no masses,  no organomegaly Lymph nodes: Cervical, supraclavicular, and axillary nodes normal.  No abnormal inguinal nodes palpated Neurologic: Grossly normal  Pelvic: External genitalia:  no lesions              Urethra:  normal appearing urethra with no masses, tenderness or lesions              Bartholins and Skenes: normal                 Vagina: normal appearing vagina with normal color and discharge, no lesions              Cervix: absent              Pap taken: No. Bimanual Exam:  Uterus:  uterus absent              Adnexa: no mass, fullness, tenderness               Rectovaginal: Confirms               Anus:  normal sphincter tone, no lesions  Chaperone, Royal Hawthorn, CMA, was present for exam.  A:  Breast and Pelvic exam H/o breast cancer 1997, s/p lumpectomy, radiation and 5 years of Tamoxifen.  Recurrent 2013.  Finished anastrozole last year, 11/2018.PMP, no HRT H/o TAH/BSO H/o CLL S/p partial nephrectomy Fatty liver disease 2010 Hypothyroidism H/o depression  P:   Mammogram guidelines reviewed. pap smear not indicated Colonoscopy due 2026 BMD will be ordered.  Will ask [facility] to call pt to schedule Blood work is done with Dr. Shelia Media and  Dr. Jana Hakim Vaccines up to date as above Follow up 1 year for breast and pelvic exam due to pt desires.  27 minutes of total time was spent for this patient encounter, including preparation, face-to-face counseling with the patient and coordination of care, and documentation of the encounter.

## 2020-06-12 ENCOUNTER — Encounter: Payer: Self-pay | Admitting: Obstetrics & Gynecology

## 2020-06-12 ENCOUNTER — Other Ambulatory Visit: Payer: Self-pay

## 2020-06-12 ENCOUNTER — Ambulatory Visit (INDEPENDENT_AMBULATORY_CARE_PROVIDER_SITE_OTHER): Payer: Medicare Other | Admitting: Obstetrics & Gynecology

## 2020-06-12 VITALS — BP 118/78 | HR 68 | Resp 16 | Ht 63.25 in | Wt 199.0 lb

## 2020-06-12 DIAGNOSIS — Z9289 Personal history of other medical treatment: Secondary | ICD-10-CM

## 2020-06-12 DIAGNOSIS — Z01419 Encounter for gynecological examination (general) (routine) without abnormal findings: Secondary | ICD-10-CM | POA: Diagnosis not present

## 2020-06-26 ENCOUNTER — Other Ambulatory Visit: Payer: Self-pay | Admitting: Oncology

## 2020-06-28 ENCOUNTER — Other Ambulatory Visit: Payer: Self-pay | Admitting: Cardiology

## 2020-07-13 ENCOUNTER — Other Ambulatory Visit: Payer: Self-pay

## 2020-07-13 ENCOUNTER — Emergency Department (HOSPITAL_COMMUNITY)
Admission: EM | Admit: 2020-07-13 | Discharge: 2020-07-13 | Disposition: A | Discharge status: Home / Self Care | Payer: Medicare Other | Attending: Emergency Medicine | Admitting: Emergency Medicine

## 2020-07-13 ENCOUNTER — Encounter (HOSPITAL_COMMUNITY): Payer: Self-pay | Admitting: Emergency Medicine

## 2020-07-13 DIAGNOSIS — I10 Essential (primary) hypertension: Secondary | ICD-10-CM | POA: Diagnosis not present

## 2020-07-13 DIAGNOSIS — L03114 Cellulitis of left upper limb: Secondary | ICD-10-CM | POA: Diagnosis not present

## 2020-07-13 DIAGNOSIS — Z79899 Other long term (current) drug therapy: Secondary | ICD-10-CM | POA: Insufficient documentation

## 2020-07-13 DIAGNOSIS — R600 Localized edema: Secondary | ICD-10-CM | POA: Diagnosis not present

## 2020-07-13 DIAGNOSIS — Z955 Presence of coronary angioplasty implant and graft: Secondary | ICD-10-CM | POA: Insufficient documentation

## 2020-07-13 DIAGNOSIS — M79622 Pain in left upper arm: Secondary | ICD-10-CM | POA: Diagnosis not present

## 2020-07-13 DIAGNOSIS — R2232 Localized swelling, mass and lump, left upper limb: Secondary | ICD-10-CM | POA: Diagnosis present

## 2020-07-13 DIAGNOSIS — Z853 Personal history of malignant neoplasm of breast: Secondary | ICD-10-CM | POA: Insufficient documentation

## 2020-07-13 LAB — COMPREHENSIVE METABOLIC PANEL
ALT: 28 U/L (ref 0–44)
AST: 27 U/L (ref 15–41)
Albumin: 4.2 g/dL (ref 3.5–5.0)
Alkaline Phosphatase: 76 U/L (ref 38–126)
Anion gap: 13 (ref 5–15)
BUN: 11 mg/dL (ref 8–23)
CO2: 18 mmol/L — ABNORMAL LOW (ref 22–32)
Calcium: 9 mg/dL (ref 8.9–10.3)
Chloride: 103 mmol/L (ref 98–111)
Creatinine, Ser: 1.08 mg/dL — ABNORMAL HIGH (ref 0.44–1.00)
GFR, Estimated: 54 mL/min — ABNORMAL LOW (ref >60)
Glucose, Bld: 92 mg/dL (ref 70–99)
Potassium: 4.1 mmol/L (ref 3.5–5.1)
Sodium: 134 mmol/L — ABNORMAL LOW (ref 135–145)
Total Bilirubin: 0.7 mg/dL (ref 0.3–1.2)
Total Protein: 6.2 g/dL — ABNORMAL LOW (ref 6.5–8.1)

## 2020-07-13 LAB — CBC WITH DIFFERENTIAL/PLATELET
Abs Immature Granulocytes: 0.03 10*3/uL (ref 0.00–0.07)
Basophils Absolute: 0.1 10*3/uL (ref 0.0–0.1)
Basophils Relative: 1 %
Eosinophils Absolute: 0.5 10*3/uL (ref 0.0–0.5)
Eosinophils Relative: 8 %
HCT: 36.5 % (ref 36.0–46.0)
Hemoglobin: 11.4 g/dL — ABNORMAL LOW (ref 12.0–15.0)
Immature Granulocytes: 1 %
Lymphocytes Relative: 30 %
Lymphs Abs: 2 10*3/uL (ref 0.7–4.0)
MCH: 28.6 pg (ref 26.0–34.0)
MCHC: 31.2 g/dL (ref 30.0–36.0)
MCV: 91.5 fL (ref 80.0–100.0)
Monocytes Absolute: 1.3 10*3/uL — ABNORMAL HIGH (ref 0.1–1.0)
Monocytes Relative: 19 %
Neutro Abs: 2.7 10*3/uL (ref 1.7–7.7)
Neutrophils Relative %: 41 %
Platelets: 206 10*3/uL (ref 150–400)
RBC: 3.99 MIL/uL (ref 3.87–5.11)
RDW: 14.4 % (ref 11.5–15.5)
WBC: 6.6 10*3/uL (ref 4.0–10.5)
nRBC: 0 % (ref 0.0–0.2)

## 2020-07-13 LAB — PROTIME-INR
INR: 1 (ref 0.8–1.2)
Prothrombin Time: 12.7 seconds (ref 11.4–15.2)

## 2020-07-13 MED ORDER — CEPHALEXIN 500 MG PO CAPS: 500.0000 mg | ORAL_CAPSULE | Freq: Four times a day (QID) | ORAL | 0 refills | Status: DC

## 2020-07-13 MED ORDER — CEPHALEXIN 500 MG PO CAPS
500.0000 mg | ORAL_CAPSULE | Freq: Once | ORAL | Status: AC
  Administered 2020-07-13: 500 mg via ORAL
  Filled 2020-07-13: qty 1

## 2020-07-13 NOTE — ED Triage Notes (Signed)
Patient here from home reporting that she was sent over from Urgent Care "to rule out blood clot". Reports left arm pain, redness, and swelling that started Tuesday.

## 2020-07-13 NOTE — Discharge Instructions (Signed)
It appears that you likely have cellulitis.  Please take the antibiotics prescribed.  Ultrasound to rule out blood clot has been ordered.  Please follow the instructions provided to get the ultrasound completed as an outpatient.

## 2020-07-13 NOTE — ED Provider Notes (Signed)
[facility] DEPT Provider Note   CSN: [id number] Arrival date & time: 07/13/20  2021     History Chief Complaint  Patient presents with  . Arm Swelling  . Arm Pain    [name] [name] is a 74 y.o. female.  HPI     74 year old female comes in a chief complaint of left upper extremity swelling.  Patient reports arm swelling and pain for the last 3 days.  Over time the arm has become sore.  She had received booster shot on the same extremity 3 or 4 days prior.  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  Patient was seen at an urgent care and sent to the ER for further evaluation for possible DVT.  Patient denies severe chest pain, shortness of breath.  Past Medical History:  Diagnosis Date  . Alopecia   . Anxiety   . Arthritis    "spine" (07/28/2013)  . Breast cancer ([place]) 1997; 2014   right  . CAP (community acquired pneumonia)    admission 06-04-2013, failed outpatient  . Chronic back pain    "lower back and upper neck" (07/28/2013)  . CLL (chronic lymphocytic leukemia) ([place]) 05/2013  . DDD (degenerative disc disease)   . 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 ear   . Depression   . Diverticulosis   . Elevated LFTs   . Fibromyalgia 1978  . GERD (gastroesophageal reflux disease)   . Hematuria   . History of syncope    episode 2008--  no recurrence since  . HLD (hyperlipidemia)   . Hypothyroidism   . Kidney stones 2014  . Plantar fasciitis   . Ruptured disk    one in neck and two in back  . S/P chemotherapy, time since greater than 12 weeks   . Sinus tachycardia    mild resting  . Skin cancer   . Stool incontinence    "at times recently"     Patient Active Problem List   Diagnosis Date Noted  . Superficial thrombophlebitis 08/23/2018  . Iron deficiency anemia 10/01/2017  . 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  . Status post lumbar spine operative procedure for decompression of spinal cord 05/26/2016  . Depression 05/05/2016   . Degenerative spondylolisthesis 04/25/2016  . Spasm of muscle, back 04/25/2016  . Genetic testing 11/30/2015  . Essential hypertension 10/18/2015  . Morbid obesity ([place]) 10/18/2015  . Upper airway cough syndrome 10/17/2015  . Renal mass 02/05/2015  . Anemia 10/25/2013  . GERD (gastroesophageal reflux disease) 09/27/2013  . CLL (chronic lymphocytic leukemia) ([place]) 07/27/2013  . Malignant neoplasm of lower-outer quadrant of right breast of female, estrogen receptor positive ([place]) 07/26/2013  . Nephrolithiasis 06/16/2013  . 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 Chronic pain syndrome 05/10/2013  . DDD (degenerative disc disease) 05/10/2013  . SOB (shortness of breath)   . Sinus tachycardia   . HLD (hyperlipidemia)   . Orthostatic hypotension   . Anxiety   . Arthritis   . Diverticulosis   . Fibromyalgia   . Hypothyroidism     Past Surgical History:  Procedure Laterality Date  . ANTERIOR LATERAL LUMBAR FUSION 4 LEVELS Left 04/25/2016   Procedure: LEFT LUMBAR ONE-TWO, LUMBAR TWO-THREE, LUMBAR THREE-FOUR, LUMBAR FOUR-FIVE ANTERIOR LATERAL LUMBAR FUSION;  Surgeon: Earnie Larsson, MD;  Location: [facility];  Service: Neurosurgery;  Laterality: Left;  . APPENDECTOMY  1997  . APPLICATION OF ROBOTIC ASSISTANCE FOR SPINAL PROCEDURE N/A 04/06/2017   Procedure: APPLICATION OF ROBOTIC  ASSISTANCE FOR SPINAL PROCEDURE;  Surgeon: Earnie Larsson, MD;  Location: [facility];  Service: Neurosurgery;  Laterality: N/A;  . BREAST BIOPSY Right 2014  . BREAST LUMPECTOMY Right 1997  . BREAST LUMPECTOMY WITH AXILLARY LYMPH NODE DISSECTION Right 1997  . CATARACT EXTRACTION, BILATERAL  2019   Dr. Kathlen Mody  . CYSTOSCOPY WITH RETROGRADE PYELOGRAM, URETEROSCOPY AND STENT PLACEMENT Bilateral 06/17/2013   Procedure: CYSTOSCOPY WITH RETROGRADE PYELOGRAM, URETEROSCOPY AND LEFT DOUBLE  J STENT PLACEMENT RIGHT URETERAL HOLMIIUM LASER AND DOUBLE J STENT ;  Surgeon: Hanley Ben, MD;  Location: [facility];  Service:  Urology;  Laterality: Bilateral;  . HOLMIUM LASER APPLICATION Bilateral 42/59/5638   Procedure: HOLMIUM LASER APPLICATION;  Surgeon: Hanley Ben, MD;  Location: [facility];  Service: Urology;  Laterality: Bilateral;  . LITHOTRIPSY Left 06/2013  . LUMBAR EPIDURAL INJECTION     has had 7 injections  . PORT-A-CATH REMOVAL Left 10/03/2014   Procedure: REMOVAL PORT-A-CATH;  Surgeon: Donnie Mesa, MD;  Location: [facility];  Service: General;  Laterality: Left;  . PORTACATH PLACEMENT Left 07/28/2013   Procedure: ATTEMPTED INSERTION PORT-A-CATH;  Surgeon: Imogene Burn. Georgette Dover, MD;  Location: [facility];  Service: General;  Laterality: Left;  . POSTERIOR LUMBAR FUSION 4 LEVEL N/A 04/25/2016   Procedure: LUMBAR FIVE-SACRAL ONE POSTERIOR LUMBAR INTERBODY FUSION, THORACIC NINE-SACRAL ONE POSTERIOR LATERAL ARTHRODESIS WITH PEDICLE SCREWS;  Surgeon: Earnie Larsson, MD;  Location: [facility];  Service: Neurosurgery;  Laterality: N/A;  . RETINAL DETACHMENT SURGERY Right 2013  . ROBOTIC ASSITED PARTIAL NEPHRECTOMY Right 02/05/2015   Procedure: ROBOTIC ASSITED PARTIAL NEPHRECTOMY;  Surgeon: Raynelle Bring, MD;  Location: [facility];  Service: Urology;  Laterality: Right;  . SIMPLE MASTECTOMY WITH AXILLARY SENTINEL NODE BIOPSY Right 07/28/2013   Procedure: RIGHT TOTAL  MASTECTOMY;  Surgeon: Imogene Burn. Georgette Dover, MD;  Location: [facility];  Service: General;  Laterality: Right;  . TONSILLECTOMY  AGE 39  . TOTAL ABDOMINAL HYSTERECTOMY W/ BILATERAL SALPINGOOPHORECTOMY  1997     OB History    Gravida  1   Para  1   Term      Preterm      AB      Living  1     SAB      TAB      Ectopic      Multiple      Live Births              Family History  Problem Relation Age of Onset  . Heart disease Maternal Grandfather   . Diabetes Maternal Grandfather   . Colon cancer Maternal Aunt 79  . Brain cancer Paternal Grandmother        dx in 72s  . Dementia Mother   . Diabetes Mother   .  Osteoporosis Mother   . Diabetes Maternal Aunt   . Prostate cancer Father 38  . Bipolar disorder Maternal Aunt   . Stroke Maternal Aunt   . Stomach cancer Paternal Uncle        dx in late 27s    Social History   Tobacco Use  . Smoking status: Never Smoker  . Smokeless tobacco: Never Used  Vaping Use  . Vaping Use: Never used  Substance Use Topics  . Alcohol use: No  . Drug use: No    Home Medications Prior to Admission medications   Medication Sig Start Date End Date Taking? Authorizing Provider  allopurinol (ZYLOPRIM) 300 MG tablet TAKE 1 TABLET(300  MG) BY MOUTH DAILY 06/26/20   Magrinat, Virgie Dad, MD  amitriptyline (ELAVIL) 100 MG tablet Take 100 mg by mouth at bedtime. 08/30/19   [provider]  buPROPion (WELLBUTRIN XL) 300 MG 24 hr tablet Take 300 mg by mouth daily. 08/30/19   [provider]  cephALEXin (KEFLEX) 500 MG capsule Take 1 capsule (500 mg total) by mouth 4 (four) times daily. 07/13/20   Varney Biles, MD  DULoxetine (CYMBALTA) 60 MG capsule Take 60 mg by mouth daily. 08/30/19   [provider]  LATUDA 40 MG TABS tablet SMARTSIG:1 Tablet(s) By Mouth Every Evening 06/08/20   [provider]  levothyroxine (SYNTHROID, LEVOTHROID) 88 MCG tablet Take 88 mcg by mouth daily before breakfast.  06/30/17   [provider]  metoprolol tartrate (LOPRESSOR) 100 MG tablet Take 1 tablet (100 mg) by mouth once 2 hours prior to procedure Patient not taking: Reported on 06/12/2020 04/25/19   Jerline Pain, MD  omeprazole (PRILOSEC) 40 MG capsule TAKE 1 CAPSULE(40 MG) BY MOUTH DAILY AFTER BREAKFAST 05/10/20   Magrinat, Virgie Dad, MD  rosuvastatin (CRESTOR) 5 MG tablet TAKE 1 TABLET(5 MG) BY MOUTH DAILY 06/28/20   Jerline Pain, MD    Allergies    Lasix [furosemide], Lithium, Sulfa antibiotics, Trazodone and nefazodone, and Lyrica [pregabalin]  Review of Systems   Review of Systems  Constitutional: Positive for activity change. Negative for  chills and fever.  Respiratory: Negative for shortness of breath.   Cardiovascular: Negative for chest pain.  Gastrointestinal: Negative for nausea and vomiting.  Skin: Positive for rash.  All other systems reviewed and are negative.   Physical Exam Updated Vital Signs BP 123/79 (BP Location: Left Wrist)   Pulse 81   Temp 98.7 F (37.1 C) (Oral)   Resp 15   LMP 08/26/1995   SpO2 98%   Physical Exam Vitals and nursing note reviewed.  Constitutional:      Appearance: She is well-developed.  HENT:     Head: Normocephalic and atraumatic.  Eyes:     Pupils: Pupils are equal, round, and reactive to light.  Cardiovascular:     Rate and Rhythm: Normal rate and regular rhythm.     Heart sounds: Normal heart sounds. No murmur heard.   Pulmonary:     Effort: Pulmonary effort is normal. No respiratory distress.  Abdominal:     General: There is no distension.     Palpations: Abdomen is soft.     Tenderness: There is no abdominal tenderness. There is no guarding or rebound.  Musculoskeletal:     Cervical back: Neck supple.     Comments: Erythema and edema of the left arm noted.  Skin is warm to touch.  Tenderness to palpation.  No crepitus.  Skin:    General: Skin is warm and dry.     Findings: Rash present.  Neurological:     Mental Status: She is alert and oriented to person, place, and time.     ED Results / Procedures / Treatments   Labs (all labs ordered are listed, but only abnormal results are displayed) Labs Reviewed  COMPREHENSIVE METABOLIC PANEL - Abnormal; Notable for the following components:      Result Value   Sodium 134 (*)    CO2 18 (*)    Creatinine, Ser 1.08 (*)    Total Protein 6.2 (*)    GFR, Estimated 54 (*)    All other components within normal limits  CBC WITH DIFFERENTIAL/PLATELET -  Abnormal; Notable for the following components:   Hemoglobin 11.4 (*)    Monocytes Absolute 1.3 (*)    All other components within normal limits  PROTIME-INR     EKG None  Radiology No results found.  Procedures Ultrasound ED Soft Tissue  Date/Time: 07/13/2020 11:11 PM Performed by: Varney Biles, MD Authorized by: Varney Biles, MD   Procedure details:    Indications: limb pain and evaluate for cellulitis     Transverse view:  Visualized   Longitudinal view:  Visualized   Images: archived   Location:    Location: upper extremity     Side:  Left Findings:     no abscess present    cellulitis present    no foreign body present   (including critical care time)  Medications Ordered in ED Medications  cephALEXin (KEFLEX) capsule 500 mg (has no administration in time range)    ED Course  I have reviewed the triage vital signs and the nursing notes.  Pertinent labs & imaging results that were available during my care of the patient were reviewed by me and considered in my medical decision making (see chart for details).    MDM Rules/Calculators/A&P                          Patient sent to the ER for evaluation of left upper extremity swelling.  On exam, it appears that she has cellulitis.  Labs ordered and reassuring.  She will be started on Keflex.  Ultrasound is showing cobblestoning.  Patient still wants to make sure she does not have DVT, outpatient ultrasound scan ordered for it.  Final Clinical Impression(s) / ED Diagnoses Final diagnoses:  Cellulitis of left upper extremity    Rx / DC Orders ED Discharge Orders         Ordered    UE VENOUS DUPLEX        07/13/20 2306    cephALEXin (KEFLEX) 500 MG capsule  4 times daily        07/13/20 2307           Varney Biles, MD 07/13/20 2311

## 2020-07-14 ENCOUNTER — Emergency Department (HOSPITAL_BASED_OUTPATIENT_CLINIC_OR_DEPARTMENT_OTHER)
Admission: RE | Admit: 2020-07-14 | Discharge: 2020-07-14 | Disposition: A | Discharge status: Home / Self Care | Payer: Medicare Other | Source: Ambulatory Visit | Attending: Emergency Medicine | Admitting: Emergency Medicine

## 2020-07-14 DIAGNOSIS — R609 Edema, unspecified: Secondary | ICD-10-CM

## 2020-07-14 DIAGNOSIS — M7989 Other specified soft tissue disorders: Secondary | ICD-10-CM

## 2020-07-14 NOTE — Progress Notes (Signed)
VASCULAR LAB    Left upper extremity venous duplex has been performed.  See CV proc for preliminary results.   Gavin Telford, RVT 07/14/2020, 3:46 PM

## 2020-07-25 DIAGNOSIS — F330 Major depressive disorder, recurrent, mild: Secondary | ICD-10-CM | POA: Diagnosis not present

## 2020-07-25 DIAGNOSIS — F411 Generalized anxiety disorder: Secondary | ICD-10-CM | POA: Diagnosis not present

## 2020-07-25 DIAGNOSIS — G894 Chronic pain syndrome: Secondary | ICD-10-CM | POA: Diagnosis not present

## 2020-07-28 ENCOUNTER — Other Ambulatory Visit: Payer: Self-pay | Admitting: Oncology

## 2020-07-28 DIAGNOSIS — C50011 Malignant neoplasm of nipple and areola, right female breast: Secondary | ICD-10-CM

## 2020-07-28 DIAGNOSIS — C9110 Chronic lymphocytic leukemia of B-cell type not having achieved remission: Secondary | ICD-10-CM

## 2020-08-02 DIAGNOSIS — M542 Cervicalgia: Secondary | ICD-10-CM | POA: Diagnosis not present

## 2020-08-02 DIAGNOSIS — M546 Pain in thoracic spine: Secondary | ICD-10-CM | POA: Diagnosis not present

## 2020-08-03 ENCOUNTER — Other Ambulatory Visit: Payer: Self-pay | Admitting: Student

## 2020-08-03 ENCOUNTER — Inpatient Hospital Stay: Payer: Medicare Other | Attending: Oncology

## 2020-08-03 ENCOUNTER — Other Ambulatory Visit: Payer: Self-pay

## 2020-08-03 ENCOUNTER — Inpatient Hospital Stay: Payer: Medicare Other

## 2020-08-03 VITALS — BP 123/63 | HR 87 | Temp 98.9°F | Resp 18

## 2020-08-03 DIAGNOSIS — Z808 Family history of malignant neoplasm of other organs or systems: Secondary | ICD-10-CM | POA: Diagnosis not present

## 2020-08-03 DIAGNOSIS — M546 Pain in thoracic spine: Secondary | ICD-10-CM

## 2020-08-03 DIAGNOSIS — K5900 Constipation, unspecified: Secondary | ICD-10-CM | POA: Insufficient documentation

## 2020-08-03 DIAGNOSIS — Z85828 Personal history of other malignant neoplasm of skin: Secondary | ICD-10-CM | POA: Insufficient documentation

## 2020-08-03 DIAGNOSIS — Z853 Personal history of malignant neoplasm of breast: Secondary | ICD-10-CM | POA: Diagnosis not present

## 2020-08-03 DIAGNOSIS — M542 Cervicalgia: Secondary | ICD-10-CM

## 2020-08-03 DIAGNOSIS — C9110 Chronic lymphocytic leukemia of B-cell type not having achieved remission: Secondary | ICD-10-CM | POA: Diagnosis not present

## 2020-08-03 DIAGNOSIS — Z800 Family history of malignant neoplasm of digestive organs: Secondary | ICD-10-CM | POA: Insufficient documentation

## 2020-08-03 DIAGNOSIS — Z807 Family history of other malignant neoplasms of lymphoid, hematopoietic and related tissues: Secondary | ICD-10-CM | POA: Diagnosis not present

## 2020-08-03 DIAGNOSIS — Z5112 Encounter for antineoplastic immunotherapy: Secondary | ICD-10-CM | POA: Diagnosis not present

## 2020-08-03 DIAGNOSIS — Z9011 Acquired absence of right breast and nipple: Secondary | ICD-10-CM | POA: Diagnosis not present

## 2020-08-03 DIAGNOSIS — C50511 Malignant neoplasm of lower-outer quadrant of right female breast: Secondary | ICD-10-CM

## 2020-08-03 LAB — LACTATE DEHYDROGENASE: LDH: 198 U/L — ABNORMAL HIGH (ref 98–192)

## 2020-08-03 LAB — CBC WITH DIFFERENTIAL/PLATELET
Abs Immature Granulocytes: 0.03 10*3/uL (ref 0.00–0.07)
Basophils Absolute: 0.1 10*3/uL (ref 0.0–0.1)
Basophils Relative: 1 %
Eosinophils Absolute: 0.3 10*3/uL (ref 0.0–0.5)
Eosinophils Relative: 4 %
HCT: 37.7 % (ref 36.0–46.0)
Hemoglobin: 12 g/dL (ref 12.0–15.0)
Immature Granulocytes: 0 %
Lymphocytes Relative: 36 %
Lymphs Abs: 3 10*3/uL (ref 0.7–4.0)
MCH: 28.3 pg (ref 26.0–34.0)
MCHC: 31.8 g/dL (ref 30.0–36.0)
MCV: 88.9 fL (ref 80.0–100.0)
Monocytes Absolute: 0.6 10*3/uL (ref 0.1–1.0)
Monocytes Relative: 8 %
Neutro Abs: 4.2 10*3/uL (ref 1.7–7.7)
Neutrophils Relative %: 51 %
Platelets: 205 10*3/uL (ref 150–400)
RBC: 4.24 MIL/uL (ref 3.87–5.11)
RDW: 14.8 % (ref 11.5–15.5)
WBC: 8.3 10*3/uL (ref 4.0–10.5)
nRBC: 0 % (ref 0.0–0.2)

## 2020-08-03 LAB — TYPE AND SCREEN
ABO/RH(D): O POS
Antibody Screen: NEGATIVE

## 2020-08-03 MED ORDER — ACETAMINOPHEN 325 MG PO TABS
ORAL_TABLET | ORAL | Status: AC
  Filled 2020-08-03: qty 2

## 2020-08-03 MED ORDER — ACETAMINOPHEN 325 MG PO TABS
650.0000 mg | ORAL_TABLET | Freq: Once | ORAL | Status: AC
  Administered 2020-08-03: 650 mg via ORAL

## 2020-08-03 MED ORDER — SODIUM CHLORIDE 0.9 % IV SOLN
375.0000 mg/m2 | Freq: Once | INTRAVENOUS | Status: AC
  Administered 2020-08-03: 800 mg via INTRAVENOUS
  Filled 2020-08-03: qty 50

## 2020-08-03 MED ORDER — DIPHENHYDRAMINE HCL 25 MG PO CAPS
ORAL_CAPSULE | ORAL | Status: AC
  Filled 2020-08-03: qty 1

## 2020-08-03 MED ORDER — SODIUM CHLORIDE 0.9 % IV SOLN
Freq: Once | INTRAVENOUS | Status: AC
  Filled 2020-08-03: qty 250

## 2020-08-03 MED ORDER — DIPHENHYDRAMINE HCL 25 MG PO CAPS
25.0000 mg | ORAL_CAPSULE | Freq: Once | ORAL | Status: AC
  Administered 2020-08-03: 25 mg via ORAL

## 2020-08-03 MED ORDER — SODIUM CHLORIDE 0.9 % IV SOLN
10.0000 mg | Freq: Once | INTRAVENOUS | Status: AC
  Administered 2020-08-03: 10 mg via INTRAVENOUS
  Filled 2020-08-03: qty 10

## 2020-08-03 NOTE — Patient Instructions (Signed)
[facility] Discharge Instructions for Patients Receiving Chemotherapy  Today you received the following chemotherapy agents Rituximab.  To help prevent nausea and vomiting after your treatment, we encourage you to take your nausea medication as directed.   If you develop nausea and vomiting that is not controlled by your nausea medication, call the clinic.   BELOW ARE SYMPTOMS THAT SHOULD BE REPORTED IMMEDIATELY:  *FEVER GREATER THAN 100.5 F  *CHILLS WITH OR WITHOUT FEVER  NAUSEA AND VOMITING THAT IS NOT CONTROLLED WITH YOUR NAUSEA MEDICATION  *UNUSUAL SHORTNESS OF BREATH  *UNUSUAL BRUISING OR BLEEDING  TENDERNESS IN MOUTH AND THROAT WITH OR WITHOUT PRESENCE OF ULCERS  *URINARY PROBLEMS  *BOWEL PROBLEMS  UNUSUAL RASH Items with * indicate a potential emergency and should be followed up as soon as possible.  Feel free to call the clinic you have any questions or concerns. The clinic phone number is [phone].  Please show the [facility] at check-in to the Emergency Department and triage nurse.

## 2020-08-03 NOTE — Progress Notes (Signed)
Pt discharged in stable condition.

## 2020-08-06 LAB — BETA 2 MICROGLOBULIN, SERUM: Beta-2 Microglobulin: 2.5 mg/L — ABNORMAL HIGH (ref 0.6–2.4)

## 2020-08-21 ENCOUNTER — Ambulatory Visit: Payer: Medicare Other | Admitting: Podiatry

## 2020-09-01 ENCOUNTER — Other Ambulatory Visit: Payer: Medicare Other

## 2020-09-06 ENCOUNTER — Ambulatory Visit
Admission: RE | Admit: 2020-09-06 | Discharge: 2020-09-06 | Disposition: A | Discharge status: Home / Self Care | Payer: Medicare Other | Source: Ambulatory Visit | Attending: Student | Admitting: Student

## 2020-09-06 ENCOUNTER — Other Ambulatory Visit: Payer: Self-pay

## 2020-09-06 DIAGNOSIS — M546 Pain in thoracic spine: Secondary | ICD-10-CM

## 2020-09-06 DIAGNOSIS — M4802 Spinal stenosis, cervical region: Secondary | ICD-10-CM | POA: Diagnosis not present

## 2020-09-06 DIAGNOSIS — M5124 Other intervertebral disc displacement, thoracic region: Secondary | ICD-10-CM | POA: Diagnosis not present

## 2020-09-06 DIAGNOSIS — M542 Cervicalgia: Secondary | ICD-10-CM

## 2020-09-06 DIAGNOSIS — M4314 Spondylolisthesis, thoracic region: Secondary | ICD-10-CM | POA: Diagnosis not present

## 2020-09-06 DIAGNOSIS — M40204 Unspecified kyphosis, thoracic region: Secondary | ICD-10-CM | POA: Diagnosis not present

## 2020-09-06 DIAGNOSIS — M5134 Other intervertebral disc degeneration, thoracic region: Secondary | ICD-10-CM | POA: Diagnosis not present

## 2020-09-07 ENCOUNTER — Telehealth: Payer: Self-pay | Admitting: Cardiology

## 2020-09-07 NOTE — Telephone Encounter (Signed)
Pt is not currently SOB. She does report SOB just walking to the bathroom (this is unchanged from 08/2019).  She did see pulmonary who said her lungs were clear. States she experiences CP, but they have told her this is r/t reflux. Denies CP, weight gain/edema, dizziness/syncope.  Informed pt that I would forward to Dr. Marlou Porch to review/advise as this is not new, is unchanged and was PRN f/u w/ Dr. Marlou Porch as of 08/2019. She is aware that it may be next week before she hears back from his nurse with his advisement.  She is agreeable to plan.

## 2020-09-07 NOTE — Telephone Encounter (Signed)
Pt c/o Shortness Of Breath: STAT if SOB developed within the last 24 hours or pt is noticeably SOB on the phone  1. Are you currently SOB (can you hear that pt is SOB on the phone)? No  2. How long have you been experiencing SOB? Going on awhile bothered her more than normal this morning   3. Are you SOB when sitting or when up moving around? Moving around   4. Are you currently experiencing any other symptoms? Back pain   [name] [name] is calling stating her PCP advised her to schedule an appt in regards to this. Please advise.

## 2020-09-10 NOTE — Telephone Encounter (Signed)
Reviewed message.  Continue to work on conditioning, daily walking, trying to build up to 30 minutes a day. Prior cardiac and lung evaluation unrevealing.   Candee Furbish, MD

## 2020-09-11 NOTE — Telephone Encounter (Signed)
Reiterated to the patient Dr. Marlou Porch recommendations to continue to work on conditioning with daily walking and increasing activity up to 30 minutes a day. She was grateful for call and agrees with plan.

## 2020-09-19 ENCOUNTER — Other Ambulatory Visit: Payer: Self-pay

## 2020-09-19 ENCOUNTER — Emergency Department (HOSPITAL_COMMUNITY): Payer: Medicare Other

## 2020-09-19 ENCOUNTER — Encounter (HOSPITAL_COMMUNITY): Payer: Self-pay | Admitting: Emergency Medicine

## 2020-09-19 ENCOUNTER — Emergency Department (HOSPITAL_COMMUNITY)
Admission: EM | Admit: 2020-09-19 | Discharge: 2020-09-19 | Disposition: A | Discharge status: Home / Self Care | Payer: Medicare Other | Attending: Emergency Medicine | Admitting: Emergency Medicine

## 2020-09-19 DIAGNOSIS — Z853 Personal history of malignant neoplasm of breast: Secondary | ICD-10-CM | POA: Insufficient documentation

## 2020-09-19 DIAGNOSIS — Z79899 Other long term (current) drug therapy: Secondary | ICD-10-CM | POA: Diagnosis not present

## 2020-09-19 DIAGNOSIS — U071 COVID-19: Secondary | ICD-10-CM | POA: Insufficient documentation

## 2020-09-19 DIAGNOSIS — Z20822 Contact with and (suspected) exposure to covid-19: Secondary | ICD-10-CM

## 2020-09-19 DIAGNOSIS — W01198A Fall on same level from slipping, tripping and stumbling with subsequent striking against other object, initial encounter: Secondary | ICD-10-CM | POA: Diagnosis not present

## 2020-09-19 DIAGNOSIS — R0781 Pleurodynia: Secondary | ICD-10-CM | POA: Insufficient documentation

## 2020-09-19 DIAGNOSIS — J1282 Pneumonia due to coronavirus disease 2019: Secondary | ICD-10-CM | POA: Diagnosis not present

## 2020-09-19 DIAGNOSIS — I10 Essential (primary) hypertension: Secondary | ICD-10-CM | POA: Diagnosis not present

## 2020-09-19 DIAGNOSIS — R2240 Localized swelling, mass and lump, unspecified lower limb: Secondary | ICD-10-CM | POA: Insufficient documentation

## 2020-09-19 DIAGNOSIS — S299XXA Unspecified injury of thorax, initial encounter: Secondary | ICD-10-CM | POA: Diagnosis not present

## 2020-09-19 DIAGNOSIS — R000 Tachycardia, unspecified: Secondary | ICD-10-CM | POA: Diagnosis not present

## 2020-09-19 DIAGNOSIS — J189 Pneumonia, unspecified organism: Secondary | ICD-10-CM

## 2020-09-19 DIAGNOSIS — R109 Unspecified abdominal pain: Secondary | ICD-10-CM | POA: Diagnosis not present

## 2020-09-19 DIAGNOSIS — R0602 Shortness of breath: Secondary | ICD-10-CM | POA: Diagnosis not present

## 2020-09-19 DIAGNOSIS — W19XXXA Unspecified fall, initial encounter: Secondary | ICD-10-CM

## 2020-09-19 DIAGNOSIS — S3991XA Unspecified injury of abdomen, initial encounter: Secondary | ICD-10-CM | POA: Diagnosis not present

## 2020-09-19 DIAGNOSIS — E039 Hypothyroidism, unspecified: Secondary | ICD-10-CM | POA: Insufficient documentation

## 2020-09-19 LAB — I-STAT CHEM 8, ED
BUN: 26 mg/dL — ABNORMAL HIGH (ref 8–23)
Calcium, Ion: 1.14 mmol/L — ABNORMAL LOW (ref 1.15–1.40)
Chloride: 99 mmol/L (ref 98–111)
Creatinine, Ser: 1.3 mg/dL — ABNORMAL HIGH (ref 0.44–1.00)
Glucose, Bld: 102 mg/dL — ABNORMAL HIGH (ref 70–99)
HCT: 37 % (ref 36.0–46.0)
Hemoglobin: 12.6 g/dL (ref 12.0–15.0)
Potassium: 4.5 mmol/L (ref 3.5–5.1)
Sodium: 130 mmol/L — ABNORMAL LOW (ref 135–145)
TCO2: 21 mmol/L — ABNORMAL LOW (ref 22–32)

## 2020-09-19 LAB — CBC WITH DIFFERENTIAL/PLATELET
Abs Immature Granulocytes: 0.03 10*3/uL (ref 0.00–0.07)
Basophils Absolute: 0 10*3/uL (ref 0.0–0.1)
Basophils Relative: 0 %
Eosinophils Absolute: 0 10*3/uL (ref 0.0–0.5)
Eosinophils Relative: 0 %
HCT: 35.3 % — ABNORMAL LOW (ref 36.0–46.0)
Hemoglobin: 11.6 g/dL — ABNORMAL LOW (ref 12.0–15.0)
Immature Granulocytes: 1 %
Lymphocytes Relative: 18 %
Lymphs Abs: 0.6 10*3/uL — ABNORMAL LOW (ref 0.7–4.0)
MCH: 28.9 pg (ref 26.0–34.0)
MCHC: 32.9 g/dL (ref 30.0–36.0)
MCV: 87.8 fL (ref 80.0–100.0)
Monocytes Absolute: 0.5 10*3/uL (ref 0.1–1.0)
Monocytes Relative: 15 %
Neutro Abs: 2.4 10*3/uL (ref 1.7–7.7)
Neutrophils Relative %: 66 %
Platelets: 166 10*3/uL (ref 150–400)
RBC: 4.02 MIL/uL (ref 3.87–5.11)
RDW: 14.6 % (ref 11.5–15.5)
WBC: 3.6 10*3/uL — ABNORMAL LOW (ref 4.0–10.5)
nRBC: 0 % (ref 0.0–0.2)

## 2020-09-19 LAB — COMPREHENSIVE METABOLIC PANEL
ALT: 35 U/L (ref 0–44)
AST: 45 U/L — ABNORMAL HIGH (ref 15–41)
Albumin: 4.6 g/dL (ref 3.5–5.0)
Alkaline Phosphatase: 70 U/L (ref 38–126)
Anion gap: 12 (ref 5–15)
BUN: 21 mg/dL (ref 8–23)
CO2: 21 mmol/L — ABNORMAL LOW (ref 22–32)
Calcium: 9.1 mg/dL (ref 8.9–10.3)
Chloride: 98 mmol/L (ref 98–111)
Creatinine, Ser: 1.38 mg/dL — ABNORMAL HIGH (ref 0.44–1.00)
GFR, Estimated: 40 mL/min — ABNORMAL LOW (ref >60)
Glucose, Bld: 108 mg/dL — ABNORMAL HIGH (ref 70–99)
Potassium: 4.4 mmol/L (ref 3.5–5.1)
Sodium: 131 mmol/L — ABNORMAL LOW (ref 135–145)
Total Bilirubin: 0.6 mg/dL (ref 0.3–1.2)
Total Protein: 7.3 g/dL (ref 6.5–8.1)

## 2020-09-19 LAB — BRAIN NATRIURETIC PEPTIDE: B Natriuretic Peptide: 21.8 pg/mL (ref 0.0–100.0)

## 2020-09-19 LAB — TROPONIN I (HIGH SENSITIVITY): Troponin I (High Sensitivity): 4 ng/L (ref <18)

## 2020-09-19 MED ORDER — CEPHALEXIN 500 MG PO CAPS
500.0000 mg | ORAL_CAPSULE | Freq: Once | ORAL | Status: AC
  Administered 2020-09-19: 500 mg via ORAL
  Filled 2020-09-19: qty 1

## 2020-09-19 MED ORDER — CEPHALEXIN 500 MG PO CAPS: 500.0000 mg | ORAL_CAPSULE | Freq: Three times a day (TID) | ORAL | 0 refills | Status: DC

## 2020-09-19 MED ORDER — TRAMADOL HCL 50 MG PO TABS: 50.0000 mg | ORAL_TABLET | Freq: Four times a day (QID) | ORAL | 0 refills | Status: DC | PRN

## 2020-09-19 MED ORDER — MORPHINE SULFATE (PF) 4 MG/ML IV SOLN
4.0000 mg | Freq: Once | INTRAVENOUS | Status: AC
  Administered 2020-09-19: 4 mg via INTRAVENOUS
  Filled 2020-09-19: qty 1

## 2020-09-19 MED ORDER — IOHEXOL 350 MG/ML SOLN: 75.0000 mL | Freq: Once | INTRAVENOUS | Status: DC | PRN

## 2020-09-19 MED ORDER — AZITHROMYCIN 250 MG PO TABS: 250.0000 mg | ORAL_TABLET | Freq: Every day | ORAL | 0 refills | Status: DC

## 2020-09-19 NOTE — Discharge Instructions (Addendum)
You may have early pneumonia causing your shortness of breath.  Take Keflex and azithromycin as prescribed  Covid test was sent and if it is positive you will need to stay home for 10 days  Take Tylenol for mild pain and tramadol for severe pain  See your doctor for follow-up  Return to ER if you have worsening shortness of breath, another fall, vomiting, trouble breathing, fever, cough

## 2020-09-19 NOTE — ED Triage Notes (Signed)
Pt states that she has had SOB, sore throat and cough for "awhile". Alert and oriented. 95% RA. 99.5 temp.

## 2020-09-19 NOTE — ED Provider Notes (Signed)
[facility] DEPT Provider Note   CSN: [id number] Arrival date & time: 09/19/20  1437     History Chief Complaint  Patient presents with  . Shortness of Breath  . Cough    CHYANE GREER is a 75 y.o. female hx of CLL, diverticulosis, fibromyalgia, HL, here with shortness of breath.  Patient has been short of breath for about a week or 2.  Patient has some sinus congestion as well.  Patient states that her leg swelling is stable.  She has been follow-up with her cardiologist recently. Patient states that yesterday she tripped and fell and hit her back.  She is complaining of some left lower rib pain and flank pain.  Patient states that she has no known Covid exposure.  She is fully vaccinated against COVID but didn't get her booster shot yet.   The history is provided by the patient.       Past Medical History:  Diagnosis Date  . Alopecia   . Anxiety   . Arthritis    "spine" (07/28/2013)  . Breast cancer ([facility]) 1997; 2014   right  . CAP (community acquired pneumonia)    admission 06-04-2013, failed outpatient  . Chronic back pain    "lower back and upper neck" (07/28/2013)  . CLL (chronic lymphocytic leukemia) ([facility]) 05/2013  . DDD (degenerative disc disease)   . 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 ear   . Depression   . Diverticulosis   . Elevated LFTs   . Fibromyalgia 1978  . GERD (gastroesophageal reflux disease)   . Hematuria   . History of syncope    episode 2008--  no recurrence since  . HLD (hyperlipidemia)   . Hypothyroidism   . Kidney stones 2014  . Plantar fasciitis   . Ruptured disk    one in neck and two in back  . S/P chemotherapy, time since greater than 12 weeks   . Sinus tachycardia    mild resting  . Skin cancer   . Stool incontinence    "at times recently"     Patient Active Problem List   Diagnosis Date Noted  . Superficial thrombophlebitis 08/23/2018  . Iron deficiency anemia 10/01/2017  . Closed pseudoarthrosis of lumbar  spine 04/06/2017  . Status post lumbar spine operative procedure for decompression of spinal cord 05/26/2016  . Depression 05/05/2016  . Degenerative spondylolisthesis 04/25/2016  . Spasm of muscle, back 04/25/2016  . Genetic testing 11/30/2015  . Essential hypertension 10/18/2015  . Morbid obesity ([facility]) 10/18/2015  . Upper airway cough syndrome 10/17/2015  . Renal mass 02/05/2015  . Anemia 10/25/2013  . GERD (gastroesophageal reflux disease) 09/27/2013  . CLL (chronic lymphocytic leukemia) ([facility]) 07/27/2013  . Malignant neoplasm of lower-outer quadrant of right breast of female, estrogen receptor positive ([facility]) 07/26/2013  . Nephrolithiasis 06/16/2013  . 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 Chronic pain syndrome 05/10/2013  . DDD (degenerative disc disease) 05/10/2013  . SOB (shortness of breath)   . Sinus tachycardia   . HLD (hyperlipidemia)   . Orthostatic hypotension   . Anxiety   . Arthritis   . Diverticulosis   . Fibromyalgia   . Hypothyroidism     Past Surgical History:  Procedure Laterality Date  . ANTERIOR LATERAL LUMBAR FUSION 4 LEVELS Left 04/25/2016   Procedure: LEFT LUMBAR ONE-TWO, LUMBAR TWO-THREE, LUMBAR THREE-FOUR, LUMBAR FOUR-FIVE ANTERIOR LATERAL LUMBAR FUSION;  Surgeon: Earnie Larsson, MD;  Location: [facility];  Service: Neurosurgery;  Laterality: Left;  .  APPENDECTOMY  1997  . APPLICATION OF ROBOTIC ASSISTANCE FOR SPINAL PROCEDURE N/A 04/06/2017   Procedure: APPLICATION OF ROBOTIC ASSISTANCE FOR SPINAL PROCEDURE;  Surgeon: Earnie Larsson, MD;  Location: [facility];  Service: Neurosurgery;  Laterality: N/A;  . BREAST BIOPSY Right 2014  . BREAST LUMPECTOMY Right 1997  . BREAST LUMPECTOMY WITH AXILLARY LYMPH NODE DISSECTION Right 1997  . CATARACT EXTRACTION, BILATERAL  2019   Dr. Kathlen Mody  . CYSTOSCOPY WITH RETROGRADE PYELOGRAM, URETEROSCOPY AND STENT PLACEMENT Bilateral 06/17/2013   Procedure: CYSTOSCOPY WITH RETROGRADE PYELOGRAM, URETEROSCOPY AND LEFT DOUBLE  J STENT  PLACEMENT RIGHT URETERAL HOLMIIUM LASER AND DOUBLE J STENT ;  Surgeon: Hanley Ben, MD;  Location: [facility];  Service: Urology;  Laterality: Bilateral;  . HOLMIUM LASER APPLICATION Bilateral [id number]   Procedure: HOLMIUM LASER APPLICATION;  Surgeon: Hanley Ben, MD;  Location: [facility];  Service: Urology;  Laterality: Bilateral;  . LITHOTRIPSY Left 06/2013  . LUMBAR EPIDURAL INJECTION     has had 7 injections  . PORT-A-CATH REMOVAL Left 10/03/2014   Procedure: REMOVAL PORT-A-CATH;  Surgeon: Donnie Mesa, MD;  Location: [facility];  Service: General;  Laterality: Left;  . PORTACATH PLACEMENT Left 07/28/2013   Procedure: ATTEMPTED INSERTION PORT-A-CATH;  Surgeon: Imogene Burn. Georgette Dover, MD;  Location: [facility];  Service: General;  Laterality: Left;  . POSTERIOR LUMBAR FUSION 4 LEVEL N/A 04/25/2016   Procedure: LUMBAR FIVE-SACRAL ONE POSTERIOR LUMBAR INTERBODY FUSION, THORACIC NINE-SACRAL ONE POSTERIOR LATERAL ARTHRODESIS WITH PEDICLE SCREWS;  Surgeon: Earnie Larsson, MD;  Location: [facility];  Service: Neurosurgery;  Laterality: N/A;  . RETINAL DETACHMENT SURGERY Right 2013  . ROBOTIC ASSITED PARTIAL NEPHRECTOMY Right 02/05/2015   Procedure: ROBOTIC ASSITED PARTIAL NEPHRECTOMY;  Surgeon: Raynelle Bring, MD;  Location: [facility];  Service: Urology;  Laterality: Right;  . SIMPLE MASTECTOMY WITH AXILLARY SENTINEL NODE BIOPSY Right 07/28/2013   Procedure: RIGHT TOTAL  MASTECTOMY;  Surgeon: Imogene Burn. Georgette Dover, MD;  Location: [facility];  Service: General;  Laterality: Right;  . TONSILLECTOMY  AGE 70  . TOTAL ABDOMINAL HYSTERECTOMY W/ BILATERAL SALPINGOOPHORECTOMY  1997     OB History    Gravida  1   Para  1   Term      Preterm      AB      Living  1     SAB      IAB      Ectopic      Multiple      Live Births              Family History  Problem Relation Age of Onset  . Heart disease Maternal Grandfather   . Diabetes Maternal Grandfather    . Colon cancer Maternal Aunt 79  . Brain cancer Paternal Grandmother        dx in 57s  . Dementia Mother   . Diabetes Mother   . Osteoporosis Mother   . Diabetes Maternal Aunt   . Prostate cancer Father 67  . Bipolar disorder Maternal Aunt   . Stroke Maternal Aunt   . Stomach cancer Paternal Uncle        dx in late 60s    Social History   Tobacco Use  . Smoking status: Never Smoker  . Smokeless tobacco: Never Used  Vaping Use  . Vaping Use: Never used  Substance Use Topics  . Alcohol use: No  . Drug use: No    Home Medications Prior to Admission medications  Medication Sig Start Date End Date Taking? Authorizing Provider  allopurinol (ZYLOPRIM) 300 MG tablet TAKE 1 TABLET(300 MG) BY MOUTH DAILY 06/26/20   Magrinat, Virgie Dad, MD  amitriptyline (ELAVIL) 100 MG tablet Take 100 mg by mouth at bedtime. 08/30/19   [provider]  buPROPion (WELLBUTRIN XL) 300 MG 24 hr tablet Take 300 mg by mouth daily. 08/30/19   [provider]  cephALEXin (KEFLEX) 500 MG capsule Take 1 capsule (500 mg total) by mouth 4 (four) times daily. 07/13/20   Varney Biles, MD  DULoxetine (CYMBALTA) 60 MG capsule Take 60 mg by mouth daily. 08/30/19   [provider]  LATUDA 40 MG TABS tablet SMARTSIG:1 Tablet(s) By Mouth Every Evening 06/08/20   [provider]  levothyroxine (SYNTHROID, LEVOTHROID) 88 MCG tablet Take 88 mcg by mouth daily before breakfast.  06/30/17   [provider]  metoprolol tartrate (LOPRESSOR) 100 MG tablet Take 1 tablet (100 mg) by mouth once 2 hours prior to procedure Patient not taking: Reported on 06/12/2020 04/25/19   Jerline Pain, MD  omeprazole (PRILOSEC) 40 MG capsule TAKE 1 CAPSULE(40 MG) BY MOUTH DAILY AFTER BREAKFAST 07/30/20   Magrinat, Virgie Dad, MD  rosuvastatin (CRESTOR) 5 MG tablet TAKE 1 TABLET(5 MG) BY MOUTH DAILY 06/28/20   Jerline Pain, MD    Allergies    Lasix [furosemide], Lithium, Sulfa antibiotics, Trazodone and  nefazodone, and Lyrica [pregabalin]  Review of Systems   Review of Systems  Respiratory: Positive for cough and shortness of breath.   All other systems reviewed and are negative.   Physical Exam Updated Vital Signs BP (!) 142/79   Pulse 87   Temp 99.5 F (37.5 C) (Oral)   Resp 12   Ht 5\' 5"  (1.651 m)   Wt 90.3 kg   LMP 08/26/1995   SpO2 100%   BMI 33.12 kg/m   Physical Exam Vitals and nursing note reviewed.  Constitutional:      Comments: Chronically ill   HENT:     Head: Normocephalic.  Eyes:     Extraocular Movements: Extraocular movements intact.     Pupils: Pupils are equal, round, and reactive to light.  Cardiovascular:     Rate and Rhythm: Normal rate and regular rhythm.  Pulmonary:     Effort: Pulmonary effort is normal.     Comments: Crackles bilateral bases  Abdominal:     General: Bowel sounds are normal.     Palpations: Abdomen is soft.     Comments: Bruising L flank   Musculoskeletal:        General: Normal range of motion.     Cervical back: Normal range of motion and neck supple.  Skin:    General: Skin is warm.     Capillary Refill: Capillary refill takes less than 2 seconds.  Neurological:     General: No focal deficit present.     Mental Status: She is oriented to person, place, and time.  Psychiatric:        Mood and Affect: Mood normal.        Behavior: Behavior normal.     ED Results / Procedures / Treatments   Labs (all labs ordered are listed, but only abnormal results are displayed) Labs Reviewed  CBC WITH DIFFERENTIAL/PLATELET - Abnormal; Notable for the following components:      Result Value   WBC 3.6 (*)    Hemoglobin 11.6 (*)    HCT 35.3 (*)    Lymphs Abs  0.6 (*)    All other components within normal limits  I-STAT CHEM 8, ED - Abnormal; Notable for the following components:   Sodium 130 (*)    BUN 26 (*)    Creatinine, Ser 1.30 (*)    Glucose, Bld 102 (*)    Calcium, Ion 1.14 (*)    TCO2 21 (*)    All other  components within normal limits  SARS CORONAVIRUS 2 (TAT 6-24 HRS)  COMPREHENSIVE METABOLIC PANEL  BRAIN NATRIURETIC PEPTIDE  URINALYSIS, ROUTINE W REFLEX MICROSCOPIC  TROPONIN I (HIGH SENSITIVITY)    EKG EKG Interpretation  Date/Time:  Wednesday September 19 2020 14:55:57 EST Ventricular Rate:  97 PR Interval:    QRS Duration: 84 QT Interval:  387 QTC Calculation: 492 R Axis:   17 Text Interpretation: Sinus tachycardia Ventricular premature complex Low voltage, precordial leads Abnormal R-wave progression, early transition Borderline prolonged QT interval Baseline wander in lead(s) V2 Poor data quality in current ECG precludes serial comparison Confirmed by Aletta Edouard [phone]) on 09/19/2020 8:27:33 PM   Radiology DG Chest 2 View  Result Date: 09/19/2020 CLINICAL DATA:  Shortness of breath EXAM: CHEST - 2 VIEW COMPARISON:  10/25/2019 FINDINGS: The heart size and mediastinal contours are within normal limits. Both lungs are clear. Posterior rods and fixating screws in the thoracolumbar spine. IMPRESSION: No active cardiopulmonary disease. Electronically Signed   By: Donavan Foil M.D.   On: 09/19/2020 15:42    Procedures Procedures   Medications Ordered in ED Medications  morphine 4 MG/ML injection 4 mg (4 mg Intravenous Given 09/19/20 2113)    ED Course  I have reviewed the triage vital signs and the nursing notes.  Pertinent labs & imaging results that were available during my care of the patient were reviewed by me and considered in my medical decision making (see chart for details).    MDM Rules/Calculators/A&P                         ADELEINE GERADS is a 75 y.o. female here with SOB, fall.  Patient has bruising in the left flank area.  Patient has no head injury.  Patient is not on any blood thinners.  Unclear why she has shortness of breath for several weeks.  Consider Covid versus pneumonia versus recurrent CLL.  For the flank pain consider kidney injury versus MSK.   Plan to get CTA chest and CT abdomen pelvis.  We will test for Covid as well but she is fully vaccinated and not hypoxic  10:24 PM Labs unremarkable.  BNP is normal.  Unfortunately her IV infiltrated during CT.  Will get a CT chest and pelvis without contrast to rule out intra-abdominal bleeding or nondisplaced rib fractures.  Chest x-ray did not show any obvious rib fractures.   11:08 PM CT showed no lymphadenopathy consistent with CLL.  Her white blood cell count is actually slightly low.  Patient has groundglass opacities in the right upper and right lower lobe.  However she is not running fevers.  She has Covid test that was sent.  At this point will discharge patient home with Z-Pak to cover atypical organisms and give tramadol for pain.    Final Clinical Impression(s) / ED Diagnoses Final diagnoses:  None    Rx / DC Orders ED Discharge Orders    None       Drenda Freeze, MD 09/19/20 2309

## 2020-09-19 NOTE — Progress Notes (Signed)
PIV consult: Noted site established by ED RN. Cancel consult.

## 2020-09-20 LAB — SARS CORONAVIRUS 2 (TAT 6-24 HRS): SARS Coronavirus 2: POSITIVE — AB

## 2020-09-21 ENCOUNTER — Telehealth: Payer: Self-pay

## 2020-09-21 NOTE — Telephone Encounter (Signed)
Pt. Reports she has been feeling "bad for several weeks now. I'm not sure when my symptoms started." Declines any further treatment at present.

## 2020-09-21 NOTE — Telephone Encounter (Signed)
Called to discuss with patient about COVID-19 symptoms and the use of one of the available treatments for those with mild to moderate Covid symptoms and at a high risk of hospitalization.  Pt appears to qualify for outpatient treatment due to co-morbid conditions and/or a member of an at-risk group in accordance with the FDA Emergency Use Authorization.    Symptom onset: Not clear per chart Vaccinated: Yes Booster? No Immunocompromised? No Qualifiers: History of CLL  Unable to reach pt - Left message and call back number [phone].   [name] [name]

## 2020-09-25 ENCOUNTER — Ambulatory Visit (INDEPENDENT_AMBULATORY_CARE_PROVIDER_SITE_OTHER): Payer: Medicare Other | Admitting: Nurse Practitioner

## 2020-09-25 VITALS — BP 130/82 | HR 105 | Temp 97.8°F

## 2020-09-25 DIAGNOSIS — U099 Post covid-19 condition, unspecified: Secondary | ICD-10-CM | POA: Diagnosis not present

## 2020-09-25 DIAGNOSIS — U071 COVID-19: Secondary | ICD-10-CM | POA: Diagnosis not present

## 2020-09-25 DIAGNOSIS — Z7409 Other reduced mobility: Secondary | ICD-10-CM | POA: Diagnosis not present

## 2020-09-25 DIAGNOSIS — J1282 Pneumonia due to coronavirus disease 2019: Secondary | ICD-10-CM

## 2020-09-25 MED ORDER — PREDNISONE 20 MG PO TABS: 20.0000 mg | ORAL_TABLET | Freq: Every day | ORAL | 0 refills | Status: AC

## 2020-09-25 NOTE — Progress Notes (Signed)
@Patient  ID: [name] [name], female    DOB: Oct 05, 1945, 75 y.o.   MRN: [id number]  Chief Complaint  Patient presents with  . Covid Positive    +1/27. Still taking antibiotic. Stats at home are normal.     Referring provider: Deland Pretty, MD   75 y.o. female hx of CLL, diverticulosis, fibromyalgia, HL  HPI  Patient presents today for post COVID care clinic visit/ED follow-up.  Patient was seen in the ED on 09/19/2020.  She did test positive for Covid and was found to have Covid pneumonia.  Patient was prescribed azithromycin cephalexin and tramadol.  Patient's husband states that she did not complete azithromycin.  They lost part of the pills.  Patient is still completing cephalexin prescription.  Patient has been weeks since discharge from the ED.  She did fall yesterday.  We will check lab work today.  Patient states that she does not think she got injured during the fall.  We discussed that she may need home health nursing and physical therapy to come out to work on her strengthening and gait stability. Denies f/c/s, n/v/d, hemoptysis, PND, chest pain or edema.       Allergies  Allergen Reactions  . Lasix [Furosemide] Nausea And Vomiting and Other (See Comments)    HEADACHE  . Lithium Other (See Comments)    Dizziness, "cause me to fall"  . Sulfa Antibiotics Hives  . Trazodone And Nefazodone Other (See Comments)    insomnia  . Lyrica [Pregabalin] Diarrhea, Nausea Only and Rash    Immunization History  Administered Date(s) Administered  . Influenza,inj,Quad PF,6+ Mos 04/27/2014  . Influenza,inj,quad, With Preservative 05/25/2018  . PFIZER(Purple Top)SARS-COV-2 Vaccination 09/13/2019, 10/04/2019  . Pneumococcal-Unspecified 07/25/2018  . Tdap 11/23/2015  . Zoster Recombinat (Shingrix) 09/01/2018, 03/17/2019    Past Medical History:  Diagnosis Date  . Alopecia   . Anxiety   . Arthritis    "spine" (07/28/2013)  . Breast cancer ([facility]) 1997; 2014   right  . CAP  (community acquired pneumonia)    admission 06-04-2013, failed outpatient  . Chronic back pain    "lower back and upper neck" (07/28/2013)  . CLL (chronic lymphocytic leukemia) ([place]) 05/2013  . DDD (degenerative disc disease)   . 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 ear   . Depression   . Diverticulosis   . Elevated LFTs   . Fibromyalgia 1978  . GERD (gastroesophageal reflux disease)   . Hematuria   . History of syncope    episode 2008--  no recurrence since  . HLD (hyperlipidemia)   . Hypothyroidism   . Kidney stones 2014  . Plantar fasciitis   . Ruptured disk    one in neck and two in back  . S/P chemotherapy, time since greater than 12 weeks   . Sinus tachycardia    mild resting  . Skin cancer   . Stool incontinence    "at times recently"     Tobacco History: Social History   Tobacco Use  Smoking Status Never Smoker  Smokeless Tobacco Never Used   Counseling given: Not Answered   Outpatient Encounter Medications as of 09/25/2020  Medication Sig  . allopurinol (ZYLOPRIM) 300 MG tablet TAKE 1 TABLET(300 MG) BY MOUTH DAILY  . amitriptyline (ELAVIL) 100 MG tablet Take 100 mg by mouth at bedtime.  Marland Kitchen azithromycin (ZITHROMAX) 250 MG tablet Take 1 tablet (250 mg total) by mouth daily. Take first 2 tablets together, then 1 every day until finished.  . cephALEXin (KEFLEX) 500  MG capsule Take 1 capsule (500 mg total) by mouth 3 (three) times daily.  . DULoxetine (CYMBALTA) 60 MG capsule Take 60 mg by mouth daily.  Marland Kitchen levothyroxine (SYNTHROID, LEVOTHROID) 88 MCG tablet Take 88 mcg by mouth daily before breakfast.   . omeprazole (PRILOSEC) 40 MG capsule TAKE 1 CAPSULE(40 MG) BY MOUTH DAILY AFTER BREAKFAST  . predniSONE (DELTASONE) 20 MG tablet Take 1 tablet (20 mg total) by mouth daily with breakfast for 5 days.  . rosuvastatin (CRESTOR) 5 MG tablet TAKE 1 TABLET(5 MG) BY MOUTH DAILY  . traMADol (ULTRAM) 50 MG tablet Take 1 tablet (50 mg total) by mouth every 6 (six) hours as needed.  Marland Kitchen buPROPion  (WELLBUTRIN XL) 300 MG 24 hr tablet Take 300 mg by mouth daily. (Patient not taking: Reported on 09/25/2020)  . LATUDA 40 MG TABS tablet SMARTSIG:1 Tablet(s) By Mouth Every Evening (Patient not taking: Reported on 09/25/2020)  . metoprolol tartrate (LOPRESSOR) 100 MG tablet Take 1 tablet (100 mg) by mouth once 2 hours prior to procedure (Patient not taking: Reported on 06/12/2020)   No facility-administered encounter medications on file as of 09/25/2020.     Review of Systems  Review of Systems  Constitutional: Positive for fatigue. Negative for fever.  HENT: Negative.   Respiratory: Positive for cough and shortness of breath.   Cardiovascular: Negative.  Negative for chest pain, palpitations and leg swelling.  Gastrointestinal: Negative.   Allergic/Immunologic: Negative.   Neurological: Positive for weakness.  Psychiatric/Behavioral: Negative.        Physical Exam  BP 130/82 (BP Location: Left Arm)   Pulse (!) 105   Temp 97.8 F (36.6 C)   LMP 08/26/1995   SpO2 95%   Wt Readings from Last 5 Encounters:  09/19/20 199 lb (90.3 kg)  06/12/20 199 lb (90.3 kg)  05/11/20 199 lb 9.6 oz (90.5 kg)  02/14/20 194 lb 4.8 oz (88.1 kg)  10/25/19 198 lb 3.2 oz (89.9 kg)     Physical Exam Vitals and nursing note reviewed.  Constitutional:      General: She is not in acute distress.    Appearance: She is well-developed and well-nourished. She is ill-appearing.  Cardiovascular:     Rate and Rhythm: Normal rate and regular rhythm.  Pulmonary:     Effort: Pulmonary effort is normal.     Breath sounds: Normal breath sounds.  Musculoskeletal:     Right lower leg: No edema.     Left lower leg: No edema.  Neurological:     Mental Status: She is alert and oriented to person, place, and time.  Psychiatric:        Mood and Affect: Mood and affect and mood normal.        Behavior: Behavior normal.      Lab Results:  CBC    Component Value Date/Time   WBC 3.6 (L) 09/19/2020 2101    RBC 4.02 09/19/2020 2101   HGB 12.6 09/19/2020 2117   HGB 12.1 05/11/2020 1034   HGB 11.1 (L) 03/25/2017 1300   HCT 37.0 09/19/2020 2117   HCT 35.8 03/25/2017 1300   PLT 166 09/19/2020 2101   PLT 161 05/11/2020 1034   PLT 161 03/25/2017 1300   MCV 87.8 09/19/2020 2101   MCV 85.6 03/25/2017 1300   MCH 28.9 09/19/2020 2101   MCHC 32.9 09/19/2020 2101   RDW 14.6 09/19/2020 2101   RDW 15.7 (H) 03/25/2017 1300   LYMPHSABS 0.6 (L) 09/19/2020 2101   LYMPHSABS 24.9 (  H) 03/25/2017 1300   MONOABS 0.5 09/19/2020 2101   MONOABS 1.3 (H) 03/25/2017 1300   EOSABS 0.0 09/19/2020 2101   EOSABS 0.5 03/25/2017 1300   BASOSABS 0.0 09/19/2020 2101   BASOSABS 0.1 03/25/2017 1300    BMET    Component Value Date/Time   NA 130 (L) 09/19/2020 2117   NA 140 03/25/2017 1300   K 4.5 09/19/2020 2117   K 4.2 03/25/2017 1300   CL 99 09/19/2020 2117   CO2 21 (L) 09/19/2020 2101   CO2 24 03/25/2017 1300   GLUCOSE 102 (H) 09/19/2020 2117   GLUCOSE 114 03/25/2017 1300   BUN 26 (H) 09/19/2020 2117   BUN 17.9 03/25/2017 1300   CREATININE 1.30 (H) 09/19/2020 2117   CREATININE 1.03 (H) 05/11/2020 1034   CREATININE 1.1 03/25/2017 1300   CALCIUM 9.1 09/19/2020 2101   CALCIUM 9.4 03/25/2017 1300   GFRNONAA 40 (L) 09/19/2020 2101   GFRNONAA 54 (L) 05/11/2020 1034   GFRNONAA 49 (L) 12/07/2014 1538   GFRAA >60 05/11/2020 1034   GFRAA 56 (L) 12/07/2014 1538    BNP    Component Value Date/Time   BNP 21.8 09/19/2020 2101   BNP 37.9 08/02/2015 1220    ProBNP No results found for: PROBNP  Imaging: CT ABDOMEN PELVIS WO CONTRAST  Result Date: 09/19/2020 CLINICAL DATA:  Short of breath, cough, fell yesterday, history of right breast cancer and CLL EXAM: CT CHEST, ABDOMEN AND PELVIS WITHOUT CONTRAST TECHNIQUE: Multidetector CT imaging of the chest, abdomen and pelvis was performed following the standard protocol without IV contrast. Unenhanced CT was performed per clinician order. Lack of IV contrast limits  sensitivity and specificity, especially for evaluation of abdominal/pelvic solid viscera. COMPARISON:  09/06/2020 FINDINGS: CT CHEST FINDINGS Cardiovascular: Unenhanced imaging of the heart and great vessels demonstrates no pericardial effusion. Normal caliber of the thoracic aorta. Minimal atherosclerosis. Mediastinum/Nodes: Lymphadenopathy is seen within the supraclavicular regions, mediastinum, and left axilla. Largest left axillary lymph node measures 14 x 39 mm on image 34. Thyroid, trachea, and esophagus are unremarkable. There is a small hiatal hernia. Lungs/Pleura: Ground-glass airspace disease within the periphery of the right lower lobe, with minimal nodular ground-glass airspace disease in the right upper lobe, may be inflammatory or infectious. No effusion or pneumothorax. The central airways are patent. Musculoskeletal: No acute or destructive bony lesions. Reconstructed images demonstrate no additional findings. CT ABDOMEN PELVIS FINDINGS Hepatobiliary: Diffuse hepatic steatosis. No focal abnormality on this unenhanced exam. The gallbladder is grossly normal. Pancreas: Diffuse fatty atrophy of the pancreas. No focal abnormalities. Spleen: Normal in size without focal abnormality. Adrenals/Urinary Tract: Postsurgical scarring upper pole right kidney from prior partial right nephrectomy. No urinary tract calculi or obstructive uropathy within either kidney. Bladder is unremarkable. The adrenals are normal. Stomach/Bowel: No bowel obstruction or ileus. Normal appendix right lower quadrant. No bowel wall thickening or inflammatory change. Vascular/Lymphatic: Lymphadenopathy throughout the abdomen and pelvis, largest lymph node in the external iliac chain on the right measuring 25 x 58 mm. Minimal atherosclerosis of the aorta. Reproductive: Status post hysterectomy. No adnexal masses. Other: No free fluid or free gas. There is a left posterolateral abdominal wall hernia, with herniation of a portion of the  descending colon. No strangulation or obstruction. Musculoskeletal: No acute or destructive bony lesions. Extensive postsurgical changes from thoracolumbar fusion. Reconstructed images demonstrate no additional findings. IMPRESSION: 1. Lymphadenopathy within the chest, abdomen, and pelvis consistent with known history of CLL. Thoracic lymph nodes have enlarged since prior exam.  2. Ground-glass airspace disease within the right upper and right lower lobe, which may be inflammatory or infectious. 3. No acute intrathoracic, intra-abdominal, or intrapelvic trauma on this limited unenhanced exam. 4. Hepatic steatosis. 5.  Aortic Atherosclerosis (ICD10-I70.0). Electronically Signed   By: Randa Ngo M.D.   On: 09/19/2020 23:03   DG Chest 2 View  Result Date: 09/19/2020 CLINICAL DATA:  Shortness of breath EXAM: CHEST - 2 VIEW COMPARISON:  10/25/2019 FINDINGS: The heart size and mediastinal contours are within normal limits. Both lungs are clear. Posterior rods and fixating screws in the thoracolumbar spine. IMPRESSION: No active cardiopulmonary disease. Electronically Signed   By: Donavan Foil M.D.   On: 09/19/2020 15:42   CT CHEST WO CONTRAST  Result Date: 09/19/2020 CLINICAL DATA:  Short of breath, cough, fell yesterday, history of right breast cancer and CLL EXAM: CT CHEST, ABDOMEN AND PELVIS WITHOUT CONTRAST TECHNIQUE: Multidetector CT imaging of the chest, abdomen and pelvis was performed following the standard protocol without IV contrast. Unenhanced CT was performed per clinician order. Lack of IV contrast limits sensitivity and specificity, especially for evaluation of abdominal/pelvic solid viscera. COMPARISON:  09/06/2020 FINDINGS: CT CHEST FINDINGS Cardiovascular: Unenhanced imaging of the heart and great vessels demonstrates no pericardial effusion. Normal caliber of the thoracic aorta. Minimal atherosclerosis. Mediastinum/Nodes: Lymphadenopathy is seen within the supraclavicular regions,  mediastinum, and left axilla. Largest left axillary lymph node measures 14 x 39 mm on image 34. Thyroid, trachea, and esophagus are unremarkable. There is a small hiatal hernia. Lungs/Pleura: Ground-glass airspace disease within the periphery of the right lower lobe, with minimal nodular ground-glass airspace disease in the right upper lobe, may be inflammatory or infectious. No effusion or pneumothorax. The central airways are patent. Musculoskeletal: No acute or destructive bony lesions. Reconstructed images demonstrate no additional findings. CT ABDOMEN PELVIS FINDINGS Hepatobiliary: Diffuse hepatic steatosis. No focal abnormality on this unenhanced exam. The gallbladder is grossly normal. Pancreas: Diffuse fatty atrophy of the pancreas. No focal abnormalities. Spleen: Normal in size without focal abnormality. Adrenals/Urinary Tract: Postsurgical scarring upper pole right kidney from prior partial right nephrectomy. No urinary tract calculi or obstructive uropathy within either kidney. Bladder is unremarkable. The adrenals are normal. Stomach/Bowel: No bowel obstruction or ileus. Normal appendix right lower quadrant. No bowel wall thickening or inflammatory change. Vascular/Lymphatic: Lymphadenopathy throughout the abdomen and pelvis, largest lymph node in the external iliac chain on the right measuring 25 x 58 mm. Minimal atherosclerosis of the aorta. Reproductive: Status post hysterectomy. No adnexal masses. Other: No free fluid or free gas. There is a left posterolateral abdominal wall hernia, with herniation of a portion of the descending colon. No strangulation or obstruction. Musculoskeletal: No acute or destructive bony lesions. Extensive postsurgical changes from thoracolumbar fusion. Reconstructed images demonstrate no additional findings. IMPRESSION: 1. Lymphadenopathy within the chest, abdomen, and pelvis consistent with known history of CLL. Thoracic lymph nodes have enlarged since prior exam. 2.  Ground-glass airspace disease within the right upper and right lower lobe, which may be inflammatory or infectious. 3. No acute intrathoracic, intra-abdominal, or intrapelvic trauma on this limited unenhanced exam. 4. Hepatic steatosis. 5.  Aortic Atherosclerosis (ICD10-I70.0). Electronically Signed   By: Randa Ngo M.D.   On: 09/19/2020 23:03   MR CERVICAL SPINE WO CONTRAST  Result Date: 09/06/2020 CLINICAL DATA:  Neck pain for several years EXAM: MRI CERVICAL SPINE WITHOUT CONTRAST TECHNIQUE: Multiplanar, multisequence MR imaging of the cervical spine was performed. No intravenous contrast was administered. COMPARISON:  X-ray 08/02/2020  FINDINGS: Alignment: 3 mm anterolisthesis C2 on C3. Straightening of the cervical lordosis. Vertebrae: No fracture, evidence of discitis, or bone lesion. Cord: Normal signal and morphology. Posterior Fossa, vertebral arteries, paraspinal tissues: There are numerous bilateral cervical chain and supraclavicular lymph nodes measuring up to 9 mm short axis. Vertebral artery flow voids are preserved. Visualized posterior fossa is within normal limits. Disc levels: C2-C3: Mild disc osteophyte complex with mild left facet and uncovertebral arthropathy resulting in borderline-mild left foraminal stenosis. No canal stenosis. C3-C4: Minimal posterior disc osteophyte complex and mild bilateral facet arthropathy. No foraminal or canal stenosis. C4-C5: Posterior disc osteophyte complex with bilateral facet and uncovertebral arthropathy resulting in mild canal stenosis with moderate to severe bilateral foraminal stenosis. C5-C6: Posterior disc osteophyte complex and bilateral facet and uncovertebral arthropathy result in mild canal stenosis with moderate to severe bilateral foraminal stenosis. C6-C7: Posterior disc osteophyte complex with bilateral facet and uncovertebral arthropathy resulting in mild canal stenosis with moderate to severe bilateral foraminal stenosis. C7-T1: Mild right  foraminal protrusion. Mild bilateral facet arthropathy. No foraminal or canal stenosis. IMPRESSION: 1. Multilevel cervical spondylosis with mild canal stenosis and moderate-to-severe bilateral foraminal stenosis from C4-5 through C6-7. 2. Numerous mildly prominent bilateral cervical chain and supraclavicular lymph nodes measuring up to 9 mm short axis. These are nonspecific and may be reactive. However, a lymphoproliferative disorder or metastatic disease cannot be excluded. Electronically Signed   By: Davina Poke D.O.   On: 09/06/2020 15:02   MR THORACIC SPINE WO CONTRAST  Result Date: 09/06/2020 CLINICAL DATA:  Back pain EXAM: MRI THORACIC SPINE WITHOUT CONTRAST TECHNIQUE: Multiplanar, multisequence MR imaging of the thoracic spine was performed. No intravenous contrast was administered. COMPARISON:  CT thoracic spine 05/11/2018 FINDINGS: Alignment: Mild retrolisthesis T8-9, T9-10, T10-11, T11-T12. Mild thoracic kyphosis. Vertebrae: Negative for fracture or mass. Hardware: Pedicle screw and posterior rod fusion extending from T9 into the lumbar spine. Cord: Normal cord signal. Lower thoracic cord obscured by artifact from fusion hardware. Paraspinal and other soft tissues: No paraspinous soft tissue mass or edema. Disc levels: Disc degeneration with disc space narrowing disc bulging and mild spurring at the disc spaces between T6 and T12. No focal disc protrusion or significant stenosis. Lower spinal canal in the thoracic spine obscured by artifact from fusion hardware. IMPRESSION: Pedicle screw and rod fusion extending from T9 into the lumbar spine. This causes significant artifact through the lower thoracic spine Multilevel disc degeneration in the mid and lower thoracic spine without significant spinal stenosis identified. Electronically Signed   By: Franchot Gallo M.D.   On: 09/06/2020 14:57     Assessment & Plan:   Pneumonia due to COVID-19 virus Weakness Falls:   Stay well  hydrated  Stay active  Deep breathing exercises  May take tylenol or fever or pain  May take mucinex twice daily  Will order home health nursing and PT  Will check labs  Will order prednisone   Patient Instructions  Covid pneumonia Weakness Falls:   Stay well hydrated  Stay active  Deep breathing exercises  May take tylenol or fever or pain  May take mucinex twice daily  Will order home health nursing and PT  Will check labs  Will order prednisone  It is important to avoid accidents which may result in broken bones.  Here are a few ideas on how to make your home safer so you will be less likely to trip or fall.  1. Use nonskid mats or non slip strips  in your shower or tub, on your bathroom floor and around sinks.  If you know that you have spilled water, wipe it up! 2. In the bathroom, it is important to have properly installed grab bars on the walls or on the edge of the tub.  Towel racks are NOT strong enough for you to hold onto or to pull on for support. 3. Stairs and hallways should have enough light.  Add lamps or night lights if you need ore light. 4. It is good to have handrails on both sides of the stairs if possible.  Always fix broken handrails right away. 5. It is important to see the edges of steps.  Paint the edges of outdoor steps white so you can see them better.  Put colored tape on the edge of inside steps. 6. Throw-rugs are dangerous because they can slide.  Removing the rugs is the best idea, but if they must stay, add adhesive carpet tape to prevent slipping. 7. Do not keep things on stairs or in the halls.  Remove small furniture that blocks the halls as it may cause you to trip.  Keep telephone and electrical cords out of the way where you walk. 8. Always were sturdy, rubber-soled shoes for good support.  Never wear just socks, especially on the stairs.  Socks may cause you to slip or fall.  Do not wear full-length housecoats as you can easily  trip on the bottom.  9. Place the things you use the most on the shelves that are the easiest to reach.  If you use a stepstool, make sure it is in good condition.  If you feel unsteady, DO NOT climb, ask for help. 10. If a health professional advises you to use a cane or walker, do not be ashamed.  These items can keep you from falling and breaking your bones.   Follow up:  Follow up in 1 week or sooner if needed      Fenton Foy, NP 09/25/2020

## 2020-09-25 NOTE — Assessment & Plan Note (Signed)
Weakness Falls:   Stay well hydrated  Stay active  Deep breathing exercises  May take tylenol or fever or pain  May take mucinex twice daily  Will order home health nursing and PT  Will check labs  Will order prednisone

## 2020-09-25 NOTE — Patient Instructions (Addendum)
Covid pneumonia Weakness Falls:   Stay well hydrated  Stay active  Deep breathing exercises  May take tylenol or fever or pain  May take mucinex twice daily  Will order home health nursing and PT  Will check labs  Will order prednisone  It is important to avoid accidents which may result in broken bones.  Here are a few ideas on how to make your home safer so you will be less likely to trip or fall.  1. Use nonskid mats or non slip strips in your shower or tub, on your bathroom floor and around sinks.  If you know that you have spilled water, wipe it up! 2. In the bathroom, it is important to have properly installed grab bars on the walls or on the edge of the tub.  Towel racks are NOT strong enough for you to hold onto or to pull on for support. 3. Stairs and hallways should have enough light.  Add lamps or night lights if you need ore light. 4. It is good to have handrails on both sides of the stairs if possible.  Always fix broken handrails right away. 5. It is important to see the edges of steps.  Paint the edges of outdoor steps white so you can see them better.  Put colored tape on the edge of inside steps. 6. Throw-rugs are dangerous because they can slide.  Removing the rugs is the best idea, but if they must stay, add adhesive carpet tape to prevent slipping. 7. Do not keep things on stairs or in the halls.  Remove small furniture that blocks the halls as it may cause you to trip.  Keep telephone and electrical cords out of the way where you walk. 8. Always were sturdy, rubber-soled shoes for good support.  Never wear just socks, especially on the stairs.  Socks may cause you to slip or fall.  Do not wear full-length housecoats as you can easily trip on the bottom.  9. Place the things you use the most on the shelves that are the easiest to reach.  If you use a stepstool, make sure it is in good condition.  If you feel unsteady, DO NOT climb, ask for help. 10. If a health  professional advises you to use a cane or walker, do not be ashamed.  These items can keep you from falling and breaking your bones.   Follow up:  Follow up in 1 week or sooner if needed

## 2020-09-26 LAB — COMPREHENSIVE METABOLIC PANEL
ALT: 44 IU/L — ABNORMAL HIGH (ref 0–32)
AST: 70 IU/L — ABNORMAL HIGH (ref 0–40)
Albumin/Globulin Ratio: 2 (ref 1.2–2.2)
Albumin: 4.1 g/dL (ref 3.7–4.7)
Alkaline Phosphatase: 88 IU/L (ref 44–121)
BUN/Creatinine Ratio: 14 (ref 12–28)
BUN: 12 mg/dL (ref 8–27)
Bilirubin Total: 0.4 mg/dL (ref 0.0–1.2)
CO2: 16 mmol/L — ABNORMAL LOW (ref 20–29)
Calcium: 8.5 mg/dL — ABNORMAL LOW (ref 8.7–10.3)
Chloride: 94 mmol/L — ABNORMAL LOW (ref 96–106)
Creatinine, Ser: 0.86 mg/dL (ref 0.57–1.00)
GFR calc Af Amer: 77 mL/min/{1.73_m2} (ref >59)
GFR calc non Af Amer: 67 mL/min/{1.73_m2} (ref >59)
Globulin, Total: 2.1 g/dL (ref 1.5–4.5)
Glucose: 101 mg/dL — ABNORMAL HIGH (ref 65–99)
Potassium: 4.6 mmol/L (ref 3.5–5.2)
Sodium: 127 mmol/L — ABNORMAL LOW (ref 134–144)
Total Protein: 6.2 g/dL (ref 6.0–8.5)

## 2020-09-26 LAB — CBC
Hematocrit: 33.8 % — ABNORMAL LOW (ref 34.0–46.6)
Hemoglobin: 11.4 g/dL (ref 11.1–15.9)
MCH: 27.6 pg (ref 26.6–33.0)
MCHC: 33.7 g/dL (ref 31.5–35.7)
MCV: 82 fL (ref 79–97)
Platelets: 302 10*3/uL (ref 150–450)
RBC: 4.13 x10E6/uL (ref 3.77–5.28)
RDW: 13.6 % (ref 11.7–15.4)
WBC: 6.6 10*3/uL (ref 3.4–10.8)

## 2020-10-02 ENCOUNTER — Ambulatory Visit: Payer: Medicare Other

## 2020-10-08 ENCOUNTER — Telehealth: Payer: Self-pay | Admitting: Nurse Practitioner

## 2020-10-08 ENCOUNTER — Telehealth (HOSPITAL_COMMUNITY): Payer: Self-pay | Admitting: Adult Health

## 2020-10-08 NOTE — Telephone Encounter (Signed)
Returned patient call.  Husband was looking for phone # for post covid clinic which he found and Alexxa has an upcoming appt there on 10/10/2020.  I reviewed that when she goes there she will undergo evaluation and management for her weakness and deconditioning and receive recommendations for care moving forward.    Wilber Bihari, NP

## 2020-10-09 NOTE — Telephone Encounter (Signed)
Done

## 2020-10-10 ENCOUNTER — Telehealth: Payer: Self-pay

## 2020-10-10 ENCOUNTER — Ambulatory Visit (INDEPENDENT_AMBULATORY_CARE_PROVIDER_SITE_OTHER): Payer: Medicare Other | Admitting: Nurse Practitioner

## 2020-10-10 ENCOUNTER — Other Ambulatory Visit: Payer: Self-pay

## 2020-10-10 ENCOUNTER — Ambulatory Visit
Admission: RE | Admit: 2020-10-10 | Discharge: 2020-10-10 | Disposition: A | Discharge status: Home / Self Care | Payer: Medicare Other | Source: Ambulatory Visit | Attending: Nurse Practitioner | Admitting: Nurse Practitioner

## 2020-10-10 VITALS — BP 88/74 | HR 93 | Temp 96.8°F

## 2020-10-10 DIAGNOSIS — J1282 Pneumonia due to coronavirus disease 2019: Secondary | ICD-10-CM | POA: Diagnosis not present

## 2020-10-10 DIAGNOSIS — U071 COVID-19: Secondary | ICD-10-CM

## 2020-10-10 DIAGNOSIS — R059 Cough, unspecified: Secondary | ICD-10-CM | POA: Diagnosis not present

## 2020-10-10 NOTE — Progress Notes (Signed)
@Patient  ID: [name] [name], female    DOB: 1946-02-15, 75 y.o.   MRN: [id number]  Chief Complaint  Patient presents with  . post covid     Dx a week ago.  SHOB. Falls, Loss of appetite and brain fog    . Cough    Referring provider: Deland Pretty, MD   75 y.o.femalehx of CLL, diverticulosis, fibromyalgia, HL  HPI  Patient presents today for post COVID care clinic visit/ follow-up.  Patient was seen in the ED on 09/19/2020.  She did test positive for Covid and was found to have Covid pneumonia.  Patient was prescribed azithromycin cephalexin and tramadol.  Patient was last seen in our office on 09/25/2020 for ED follow-up.  Patient appears to be improving.  She does still have a loss of appetite and brain fog.  She does continue plan for cough.  She has been having multiple falls.  We have tried to order home health physical therapy and home health nursing but unfortunately there is a nursing and PT staffing shortage at this time.  We will continue to try to find someone to come out to the home to help her.  She does have her husband at home to help.  She is due for a repeat chest x-ray and labs. Denies f/c/s, n/v/d, hemoptysis, PND, chest pain or edema.       Allergies  Allergen Reactions  . Lasix [Furosemide] Nausea And Vomiting and Other (See Comments)    HEADACHE  . Lithium Other (See Comments)    Dizziness, "cause me to fall"  . Sulfa Antibiotics Hives  . Trazodone And Nefazodone Other (See Comments)    insomnia  . Lyrica [Pregabalin] Diarrhea, Nausea Only and Rash    Immunization History  Administered Date(s) Administered  . Influenza,inj,Quad PF,6+ Mos 04/27/2014  . Influenza,inj,quad, With Preservative 05/25/2018  . PFIZER(Purple Top)SARS-COV-2 Vaccination 09/13/2019, 10/04/2019  . Pneumococcal-Unspecified 07/25/2018  . Tdap 11/23/2015  . Zoster Recombinat (Shingrix) 09/01/2018, 03/17/2019    Past Medical History:  Diagnosis Date  . Alopecia   . Anxiety   .  Arthritis    "spine" (07/28/2013)  . Breast cancer ([facility]) 1997; 2014   right  . CAP (community acquired pneumonia)    admission 06-04-2013, failed outpatient  . Chronic back pain    "lower back and upper neck" (07/28/2013)  . 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 DDD (degenerative disc disease)   . 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 ear   . Depression   . Diverticulosis   . Elevated LFTs   . Fibromyalgia 1978  . GERD (gastroesophageal reflux disease)   . Hematuria   . History of syncope    episode 2008--  no recurrence since  . HLD (hyperlipidemia)   . Hypothyroidism   . Kidney stones 2014  . Plantar fasciitis   . Ruptured disk    one in neck and two in back  . S/P chemotherapy, time since greater than 12 weeks   . Sinus tachycardia    mild resting  . Skin cancer   . Stool incontinence    "at times recently"     Tobacco History: Social History   Tobacco Use  Smoking Status Never Smoker  Smokeless Tobacco Never Used   Counseling given: Not Answered   Outpatient Encounter Medications as of 10/10/2020  Medication Sig  . allopurinol (ZYLOPRIM) 300 MG tablet TAKE 1 TABLET(300 MG) BY MOUTH DAILY  . amitriptyline (ELAVIL) 100 MG tablet Take 100 mg by mouth  at bedtime.  Marland Kitchen buPROPion (WELLBUTRIN XL) 300 MG 24 hr tablet Take 300 mg by mouth daily.  Marland Kitchen omeprazole (PRILOSEC) 40 MG capsule TAKE 1 CAPSULE(40 MG) BY MOUTH DAILY AFTER BREAKFAST  . rosuvastatin (CRESTOR) 5 MG tablet TAKE 1 TABLET(5 MG) BY MOUTH DAILY  . traMADol (ULTRAM) 50 MG tablet Take 1 tablet (50 mg total) by mouth every 6 (six) hours as needed.  . DULoxetine (CYMBALTA) 60 MG capsule Take 60 mg by mouth daily. (Patient not taking: Reported on 10/10/2020)  . LATUDA 40 MG TABS tablet SMARTSIG:1 Tablet(s) By Mouth Every Evening (Patient not taking: No sig reported)  . levothyroxine (SYNTHROID, LEVOTHROID) 88 MCG tablet Take 88 mcg by mouth daily before breakfast.  (Patient not taking: Reported on 10/10/2020)  . metoprolol  tartrate (LOPRESSOR) 100 MG tablet Take 1 tablet (100 mg) by mouth once 2 hours prior to procedure (Patient not taking: No sig reported)  . [DISCONTINUED] azithromycin (ZITHROMAX) 250 MG tablet Take 1 tablet (250 mg total) by mouth daily. Take first 2 tablets together, then 1 every day until finished. (Patient not taking: Reported on 10/10/2020)  . [DISCONTINUED] cephALEXin (KEFLEX) 500 MG capsule Take 1 capsule (500 mg total) by mouth 3 (three) times daily. (Patient not taking: Reported on 10/10/2020)   No facility-administered encounter medications on file as of 10/10/2020.     Review of Systems  Review of Systems  Constitutional: Positive for fatigue. Negative for fever.  HENT: Negative.   Respiratory: Positive for cough and shortness of breath.   Cardiovascular: Negative.  Negative for chest pain, palpitations and leg swelling.  Gastrointestinal: Negative.   Allergic/Immunologic: Negative.   Neurological: Positive for weakness.  Psychiatric/Behavioral: Negative.        Physical Exam  BP (!) 88/74   Pulse 93   Temp (!) 96.8 F (36 C) (Temporal)   LMP 08/26/1995   SpO2 97%   Wt Readings from Last 5 Encounters:  09/19/20 199 lb (90.3 kg)  06/12/20 199 lb (90.3 kg)  05/11/20 199 lb 9.6 oz (90.5 kg)  02/14/20 194 lb 4.8 oz (88.1 kg)  10/25/19 198 lb 3.2 oz (89.9 kg)     Physical Exam Vitals and nursing note reviewed.  Constitutional:      General: She is not in acute distress.    Appearance: She is well-developed and well-nourished.  Cardiovascular:     Rate and Rhythm: Normal rate and regular rhythm.  Pulmonary:     Effort: Pulmonary effort is normal.     Breath sounds: Normal breath sounds.  Musculoskeletal:     Right lower leg: No edema.     Left lower leg: No edema.  Neurological:     Mental Status: She is alert and oriented to person, place, and time.  Psychiatric:        Mood and Affect: Mood and affect and mood normal.        Behavior: Behavior normal.        Imaging: CT ABDOMEN PELVIS WO CONTRAST  Result Date: 09/19/2020 CLINICAL DATA:  Short of breath, cough, fell yesterday, history of right breast cancer and CLL EXAM: CT CHEST, ABDOMEN AND PELVIS WITHOUT CONTRAST TECHNIQUE: Multidetector CT imaging of the chest, abdomen and pelvis was performed following the standard protocol without IV contrast. Unenhanced CT was performed per clinician order. Lack of IV contrast limits sensitivity and specificity, especially for evaluation of abdominal/pelvic solid viscera. COMPARISON:  09/06/2020 FINDINGS: CT CHEST FINDINGS Cardiovascular: Unenhanced imaging of the heart and great vessels  demonstrates no pericardial effusion. Normal caliber of the thoracic aorta. Minimal atherosclerosis. Mediastinum/Nodes: Lymphadenopathy is seen within the supraclavicular regions, mediastinum, and left axilla. Largest left axillary lymph node measures 14 x 39 mm on image 34. Thyroid, trachea, and esophagus are unremarkable. There is a small hiatal hernia. Lungs/Pleura: Ground-glass airspace disease within the periphery of the right lower lobe, with minimal nodular ground-glass airspace disease in the right upper lobe, may be inflammatory or infectious. No effusion or pneumothorax. The central airways are patent. Musculoskeletal: No acute or destructive bony lesions. Reconstructed images demonstrate no additional findings. CT ABDOMEN PELVIS FINDINGS Hepatobiliary: Diffuse hepatic steatosis. No focal abnormality on this unenhanced exam. The gallbladder is grossly normal. Pancreas: Diffuse fatty atrophy of the pancreas. No focal abnormalities. Spleen: Normal in size without focal abnormality. Adrenals/Urinary Tract: Postsurgical scarring upper pole right kidney from prior partial right nephrectomy. No urinary tract calculi or obstructive uropathy within either kidney. Bladder is unremarkable. The adrenals are normal. Stomach/Bowel: No bowel obstruction or ileus. Normal appendix right  lower quadrant. No bowel wall thickening or inflammatory change. Vascular/Lymphatic: Lymphadenopathy throughout the abdomen and pelvis, largest lymph node in the external iliac chain on the right measuring 25 x 58 mm. Minimal atherosclerosis of the aorta. Reproductive: Status post hysterectomy. No adnexal masses. Other: No free fluid or free gas. There is a left posterolateral abdominal wall hernia, with herniation of a portion of the descending colon. No strangulation or obstruction. Musculoskeletal: No acute or destructive bony lesions. Extensive postsurgical changes from thoracolumbar fusion. Reconstructed images demonstrate no additional findings. IMPRESSION: 1. Lymphadenopathy within the chest, abdomen, and pelvis consistent with known history of CLL. Thoracic lymph nodes have enlarged since prior exam. 2. Ground-glass airspace disease within the right upper and right lower lobe, which may be inflammatory or infectious. 3. No acute intrathoracic, intra-abdominal, or intrapelvic trauma on this limited unenhanced exam. 4. Hepatic steatosis. 5.  Aortic Atherosclerosis (ICD10-I70.0). Electronically Signed   By: Randa Ngo M.D.   On: 09/19/2020 23:03   DG Chest 2 View  Result Date: 10/12/2020 CLINICAL DATA:  COVID pneumonia, persistent dyspnea and cough, history of breast cancer EXAM: CHEST - 2 VIEW COMPARISON:  09/19/2020 chest radiograph. FINDINGS: Surgical clips overlie the right axilla. Bilateral posterior spinal fusion hardware extending inferiorly from the lower thoracic spine. Stable cardiomediastinal silhouette with normal heart size. No pneumothorax. No pleural effusion. Faint patchy hazy upper lung opacities, new. IMPRESSION: Faint patchy hazy upper lung opacities, new, suspect sequela of recent COVID-19 infection. If there is clinical concern for postinflammatory fibrosis, follow-up high-resolution chest CT study could be obtained in 3-6 months. Electronically Signed   By: Ilona Sorrel M.D.   On:  10/12/2020 12:17   DG Chest 2 View  Result Date: 09/19/2020 CLINICAL DATA:  Shortness of breath EXAM: CHEST - 2 VIEW COMPARISON:  10/25/2019 FINDINGS: The heart size and mediastinal contours are within normal limits. Both lungs are clear. Posterior rods and fixating screws in the thoracolumbar spine. IMPRESSION: No active cardiopulmonary disease. Electronically Signed   By: Donavan Foil M.D.   On: 09/19/2020 15:42   CT CHEST WO CONTRAST  Result Date: 09/19/2020 CLINICAL DATA:  Short of breath, cough, fell yesterday, history of right breast cancer and CLL EXAM: CT CHEST, ABDOMEN AND PELVIS WITHOUT CONTRAST TECHNIQUE: Multidetector CT imaging of the chest, abdomen and pelvis was performed following the standard protocol without IV contrast. Unenhanced CT was performed per clinician order. Lack of IV contrast limits sensitivity and specificity, especially for evaluation  of abdominal/pelvic solid viscera. COMPARISON:  09/06/2020 FINDINGS: CT CHEST FINDINGS Cardiovascular: Unenhanced imaging of the heart and great vessels demonstrates no pericardial effusion. Normal caliber of the thoracic aorta. Minimal atherosclerosis. Mediastinum/Nodes: Lymphadenopathy is seen within the supraclavicular regions, mediastinum, and left axilla. Largest left axillary lymph node measures 14 x 39 mm on image 34. Thyroid, trachea, and esophagus are unremarkable. There is a small hiatal hernia. Lungs/Pleura: Ground-glass airspace disease within the periphery of the right lower lobe, with minimal nodular ground-glass airspace disease in the right upper lobe, may be inflammatory or infectious. No effusion or pneumothorax. The central airways are patent. Musculoskeletal: No acute or destructive bony lesions. Reconstructed images demonstrate no additional findings. CT ABDOMEN PELVIS FINDINGS Hepatobiliary: Diffuse hepatic steatosis. No focal abnormality on this unenhanced exam. The gallbladder is grossly normal. Pancreas: Diffuse fatty  atrophy of the pancreas. No focal abnormalities. Spleen: Normal in size without focal abnormality. Adrenals/Urinary Tract: Postsurgical scarring upper pole right kidney from prior partial right nephrectomy. No urinary tract calculi or obstructive uropathy within either kidney. Bladder is unremarkable. The adrenals are normal. Stomach/Bowel: No bowel obstruction or ileus. Normal appendix right lower quadrant. No bowel wall thickening or inflammatory change. Vascular/Lymphatic: Lymphadenopathy throughout the abdomen and pelvis, largest lymph node in the external iliac chain on the right measuring 25 x 58 mm. Minimal atherosclerosis of the aorta. Reproductive: Status post hysterectomy. No adnexal masses. Other: No free fluid or free gas. There is a left posterolateral abdominal wall hernia, with herniation of a portion of the descending colon. No strangulation or obstruction. Musculoskeletal: No acute or destructive bony lesions. Extensive postsurgical changes from thoracolumbar fusion. Reconstructed images demonstrate no additional findings. IMPRESSION: 1. Lymphadenopathy within the chest, abdomen, and pelvis consistent with known history of CLL. Thoracic lymph nodes have enlarged since prior exam. 2. Ground-glass airspace disease within the right upper and right lower lobe, which may be inflammatory or infectious. 3. No acute intrathoracic, intra-abdominal, or intrapelvic trauma on this limited unenhanced exam. 4. Hepatic steatosis. 5.  Aortic Atherosclerosis (ICD10-I70.0). Electronically Signed   By: Randa Ngo M.D.   On: 09/19/2020 23:03     Assessment & Plan:   Pneumonia due to COVID-19 virus Shortness of breath:   Stay well hydrated  Stay active  Deep breathing exercises  May take tylenol or fever or pain  May take mucinex twice daily  Will order chest x ray:  [facility] [address], [place] [address] [phone] MON - FRI 8:00 AM - 4:00 PM - WALK IN  Will order  labs  Will re-order home health nursing and PT   Follow up:  Follow up in 2 weeks or sooner if needed      Fenton Foy, NP 10/16/2020

## 2020-10-10 NOTE — Telephone Encounter (Signed)
I have started the referral for the [facility] per provider. Since they were unsuccessful with [facility] home health care, since they did not have the staff to help patient. Fax and referral forms have been sent to [facility] @ [phone].

## 2020-10-10 NOTE — Patient Instructions (Signed)
Covid 19 Shortness of breath:   Stay well hydrated  Stay active  Deep breathing exercises  May take tylenol or fever or pain  May take mucinex twice daily  Will order chest x ray:  [facility] [address], [place] [address] [phone] MON - FRI 8:00 AM - 4:00 PM - WALK IN  Will order labs  Will re-order home health nursing and PT   Follow up:  Follow up in 2 weeks or sooner if needed

## 2020-10-11 LAB — COMPREHENSIVE METABOLIC PANEL
ALT: 40 IU/L — ABNORMAL HIGH (ref 0–32)
AST: 40 IU/L (ref 0–40)
Albumin/Globulin Ratio: 1.6 (ref 1.2–2.2)
Albumin: 3.9 g/dL (ref 3.7–4.7)
Alkaline Phosphatase: 184 IU/L — ABNORMAL HIGH (ref 44–121)
BUN/Creatinine Ratio: 9 — ABNORMAL LOW (ref 12–28)
BUN: 14 mg/dL (ref 8–27)
Bilirubin Total: 0.5 mg/dL (ref 0.0–1.2)
CO2: 18 mmol/L — ABNORMAL LOW (ref 20–29)
Calcium: 9.1 mg/dL (ref 8.7–10.3)
Chloride: 98 mmol/L (ref 96–106)
Creatinine, Ser: 1.52 mg/dL — ABNORMAL HIGH (ref 0.57–1.00)
GFR calc Af Amer: 39 mL/min/{1.73_m2} — ABNORMAL LOW (ref >59)
GFR calc non Af Amer: 34 mL/min/{1.73_m2} — ABNORMAL LOW (ref >59)
Globulin, Total: 2.4 g/dL (ref 1.5–4.5)
Glucose: 101 mg/dL — ABNORMAL HIGH (ref 65–99)
Potassium: 3.9 mmol/L (ref 3.5–5.2)
Sodium: 136 mmol/L (ref 134–144)
Total Protein: 6.3 g/dL (ref 6.0–8.5)

## 2020-10-11 LAB — CBC
Hematocrit: 30 % — ABNORMAL LOW (ref 34.0–46.6)
Hemoglobin: 10.2 g/dL — ABNORMAL LOW (ref 11.1–15.9)
MCH: 28.7 pg (ref 26.6–33.0)
MCHC: 34 g/dL (ref 31.5–35.7)
MCV: 84 fL (ref 79–97)
Platelets: 206 10*3/uL (ref 150–450)
RBC: 3.56 x10E6/uL — ABNORMAL LOW (ref 3.77–5.28)
RDW: 13.9 % (ref 11.7–15.4)
WBC: 13.3 10*3/uL — ABNORMAL HIGH (ref 3.4–10.8)

## 2020-10-12 ENCOUNTER — Encounter: Payer: Self-pay | Admitting: *Deleted

## 2020-10-12 DIAGNOSIS — M5134 Other intervertebral disc degeneration, thoracic region: Secondary | ICD-10-CM | POA: Diagnosis not present

## 2020-10-12 DIAGNOSIS — C9190 Lymphoid leukemia, unspecified not having achieved remission: Secondary | ICD-10-CM | POA: Diagnosis not present

## 2020-10-12 DIAGNOSIS — K219 Gastro-esophageal reflux disease without esophagitis: Secondary | ICD-10-CM | POA: Diagnosis not present

## 2020-10-12 DIAGNOSIS — J1282 Pneumonia due to coronavirus disease 2019: Secondary | ICD-10-CM | POA: Diagnosis not present

## 2020-10-12 DIAGNOSIS — E039 Hypothyroidism, unspecified: Secondary | ICD-10-CM | POA: Diagnosis not present

## 2020-10-12 DIAGNOSIS — F419 Anxiety disorder, unspecified: Secondary | ICD-10-CM | POA: Diagnosis not present

## 2020-10-12 DIAGNOSIS — F32A Depression, unspecified: Secondary | ICD-10-CM | POA: Diagnosis not present

## 2020-10-12 DIAGNOSIS — M797 Fibromyalgia: Secondary | ICD-10-CM | POA: Diagnosis not present

## 2020-10-12 DIAGNOSIS — I700 Atherosclerosis of aorta: Secondary | ICD-10-CM | POA: Diagnosis not present

## 2020-10-12 DIAGNOSIS — M47812 Spondylosis without myelopathy or radiculopathy, cervical region: Secondary | ICD-10-CM | POA: Diagnosis not present

## 2020-10-12 DIAGNOSIS — G8929 Other chronic pain: Secondary | ICD-10-CM | POA: Diagnosis not present

## 2020-10-12 DIAGNOSIS — U071 COVID-19: Secondary | ICD-10-CM | POA: Diagnosis not present

## 2020-10-12 DIAGNOSIS — M4802 Spinal stenosis, cervical region: Secondary | ICD-10-CM | POA: Diagnosis not present

## 2020-10-12 DIAGNOSIS — K5790 Diverticulosis of intestine, part unspecified, without perforation or abscess without bleeding: Secondary | ICD-10-CM | POA: Diagnosis not present

## 2020-10-12 DIAGNOSIS — H9191 Unspecified hearing loss, right ear: Secondary | ICD-10-CM | POA: Diagnosis not present

## 2020-10-12 DIAGNOSIS — R000 Tachycardia, unspecified: Secondary | ICD-10-CM | POA: Diagnosis not present

## 2020-10-15 ENCOUNTER — Other Ambulatory Visit: Payer: Self-pay | Admitting: Nurse Practitioner

## 2020-10-15 MED ORDER — AMOXICILLIN-POT CLAVULANATE 875-125 MG PO TABS: 1.0000 | ORAL_TABLET | Freq: Two times a day (BID) | ORAL | 0 refills | Status: AC

## 2020-10-16 DIAGNOSIS — F411 Generalized anxiety disorder: Secondary | ICD-10-CM | POA: Diagnosis not present

## 2020-10-16 DIAGNOSIS — G894 Chronic pain syndrome: Secondary | ICD-10-CM | POA: Diagnosis not present

## 2020-10-16 DIAGNOSIS — F332 Major depressive disorder, recurrent severe without psychotic features: Secondary | ICD-10-CM | POA: Diagnosis not present

## 2020-10-16 NOTE — Assessment & Plan Note (Signed)
Shortness of breath:   Stay well hydrated  Stay active  Deep breathing exercises  May take tylenol or fever or pain  May take mucinex twice daily  Will order chest x ray:  [facility] [address], [place] [address] [phone] MON - FRI 8:00 AM - 4:00 PM - WALK IN  Will order labs  Will re-order home health nursing and PT   Follow up:  Follow up in 2 weeks or sooner if needed

## 2020-10-23 NOTE — Progress Notes (Signed)
ID: [name] OB: 03-16-1946  MR#: [id number]  DEY#:[id number]  Patient Care Team: Deland Pretty, MD as PCP - General (Internal Medicine) Jerline Pain, MD as PCP - Cardiology (Cardiology) Ikran Patman, Virgie Dad, MD as Consulting Physician (Oncology) Milus Banister, MD as Attending Physician (Gastroenterology) Megan Salon, MD as Consulting Physician (Gynecology) Donnie Mesa, MD as Consulting Physician (General Surgery) Noemi Chapel, NP as Nurse Practitioner Rod Can, MD as Consulting Physician (Orthopedic Surgery) Latanya Maudlin, MD as Consulting Physician (Orthopedic Surgery) Ria Clock, MD as Attending Physician (Radiology) Adele Dan, MD as Referring Physician (Orthopedic Surgery)   CHIEF COMPLAINT: 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  She is accompanied by her daughter [name] [name] was on rituximab maintenance given every 8 weeks.  She tolerates this with no side effects that she is aware of.  Her last dose was 08/03/2020.  Her next dose was scheduled for 09/28/2020 but she presented to the ED on 09/19/2020 with shortness of breath and cough. She tested positive for Covid-19.  She also underwent CT chest, abdomen, pelvis in the ED showing: lymphadenopathy consistent with known history of CLL, thoracic lymph nodes have enlarged since prior exam; ground-glass airspace disease within right upper and lower lobes, which may be inflammatory or infectious; no acute trauma.   REVIEW OF SYSTEMS: Rupal tells me she did "okay" with Covid.  She mostly felt weak.,  Had a bit of a cough, and had decreased appetite and a temperature.  By the time she was diagnosed according to the daughter it was too late to receive an  infusion.  The patient has slowly recovered although she is still needing a walker and has had multiple falls.  She continues to have back pain and tells me additional back surgery is being considered.  A detailed review of systems today was otherwise stable   COVID 19 VACCINATION STATUS: [place] x2, most recently 09/2019; infection 09/19/2020   BREAST CANCER HISTORY: From the prior summary:  [name] [name] has a history of breast cancer dating back to 1997, [name] [name] performed a right lumpectomy and axillary lymph node dissection for what according to the patient and her family was a stage I invasive ductal breast cancer. She received radiation adjuvantly and then took tamoxifen for 5 years  In February of 2014 she had a normal screening mammography, but in November 2014 she felt a "hard lump" in her right breast. She brought this to the attention of her urologist, Dr. Izora Gala, and he set her up for bilateral diagnostic mammography with mammography at [facility] 07/13/2013. This showed her breast density to be category B. There was no mammographic abnormality noted but ultrasonography showed a 1.4 cm irregularly shaped solid mass in the right breast at the 8:00 position. This was biopsied the same day, and showed ([id number]) an invasive ductal carcinoma, grade 2, estrogen receptor 100% positive, progesterone receptor 13% positive, with an MIB-1 of 33% and HER-2 amplification with a HER-2: CEP 17 ratio of 3.19, and a HER-2 copy number percent all of 4.15.  The patient's subsequent history is as detailed below     PAST MEDICAL HISTORY: Past Medical History:  Diagnosis Date  . Alopecia   . Anxiety   . Arthritis    "spine" (07/28/2013)  . Breast cancer (  [place]) 1997; 2014   right  . CAP (community acquired pneumonia)    admission 06-04-2013, failed outpatient  . Chronic back pain    "lower back and upper neck" (07/28/2013)  . CLL (chronic lymphocytic leukemia) ([place]) 05/2013  . DDD (degenerative  disc disease)   . 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 ear   . Depression   . Diverticulosis   . Elevated LFTs   . Fibromyalgia 1978  . GERD (gastroesophageal reflux disease)   . Hematuria   . History of syncope    episode 2008--  no recurrence since  . HLD (hyperlipidemia)   . Hypothyroidism   . Kidney stones 2014  . Plantar fasciitis   . Ruptured disk    one in neck and two in back  . S/P chemotherapy, time since greater than 12 weeks   . Sinus tachycardia    mild resting  . Skin cancer   . Stool incontinence    "at times recently"     PAST SURGICAL HISTORY: Past Surgical History:  Procedure Laterality Date  . ANTERIOR LATERAL LUMBAR FUSION 4 LEVELS Left 04/25/2016   Procedure: LEFT LUMBAR ONE-TWO, LUMBAR TWO-THREE, LUMBAR THREE-FOUR, LUMBAR FOUR-FIVE ANTERIOR LATERAL LUMBAR FUSION;  Surgeon: Earnie Larsson, MD;  Location: [place];  Service: Neurosurgery;  Laterality: Left;  . APPENDECTOMY  1997  . APPLICATION OF ROBOTIC ASSISTANCE FOR SPINAL PROCEDURE N/A 04/06/2017   Procedure: APPLICATION OF ROBOTIC ASSISTANCE FOR SPINAL PROCEDURE;  Surgeon: Earnie Larsson, MD;  Location: [facility];  Service: Neurosurgery;  Laterality: N/A;  . BREAST BIOPSY Right 2014  . BREAST LUMPECTOMY Right 1997  . BREAST LUMPECTOMY WITH AXILLARY LYMPH NODE DISSECTION Right 1997  . CATARACT EXTRACTION, BILATERAL  2019   Dr. Kathlen Mody  . CYSTOSCOPY WITH RETROGRADE PYELOGRAM, URETEROSCOPY AND STENT PLACEMENT Bilateral 06/17/2013   Procedure: CYSTOSCOPY WITH RETROGRADE PYELOGRAM, URETEROSCOPY AND LEFT DOUBLE  J STENT PLACEMENT RIGHT URETERAL HOLMIIUM LASER AND DOUBLE J STENT ;  Surgeon: Hanley Ben, MD;  Location: [facility];  Service: Urology;  Laterality: Bilateral;  . HOLMIUM LASER APPLICATION Bilateral 37/90/2409   Procedure: HOLMIUM LASER APPLICATION;  Surgeon: Hanley Ben, MD;  Location: [facility];  Service: Urology;  Laterality: Bilateral;  . LITHOTRIPSY Left 06/2013  . LUMBAR EPIDURAL  INJECTION     has had 7 injections  . PORT-A-CATH REMOVAL Left 10/03/2014   Procedure: REMOVAL PORT-A-CATH;  Surgeon: Donnie Mesa, MD;  Location: [facility];  Service: General;  Laterality: Left;  . PORTACATH PLACEMENT Left 07/28/2013   Procedure: ATTEMPTED INSERTION PORT-A-CATH;  Surgeon: Imogene Burn. Georgette Dover, MD;  Location: [facility];  Service: General;  Laterality: Left;  . POSTERIOR LUMBAR FUSION 4 LEVEL N/A 04/25/2016   Procedure: LUMBAR FIVE-SACRAL ONE POSTERIOR LUMBAR INTERBODY FUSION, THORACIC NINE-SACRAL ONE POSTERIOR LATERAL ARTHRODESIS WITH PEDICLE SCREWS;  Surgeon: Earnie Larsson, MD;  Location: [facility];  Service: Neurosurgery;  Laterality: N/A;  . RETINAL DETACHMENT SURGERY Right 2013  . ROBOTIC ASSITED PARTIAL NEPHRECTOMY Right 02/05/2015   Procedure: ROBOTIC ASSITED PARTIAL NEPHRECTOMY;  Surgeon: Raynelle Bring, MD;  Location: [facility];  Service: Urology;  Laterality: Right;  . SIMPLE MASTECTOMY WITH AXILLARY SENTINEL NODE BIOPSY Right 07/28/2013   Procedure: RIGHT TOTAL  MASTECTOMY;  Surgeon: Imogene Burn. Georgette Dover, MD;  Location: [facility];  Service: General;  Laterality: Right;  . TONSILLECTOMY  AGE 55  . TOTAL ABDOMINAL HYSTERECTOMY W/ BILATERAL SALPINGOOPHORECTOMY  1997    FAMILY HISTORY Family History  Problem Relation Age of Onset  .  Heart disease Maternal Grandfather   . Diabetes Maternal Grandfather   . Colon cancer Maternal Aunt 79  . Brain cancer Paternal Grandmother        dx in 38s  . Dementia Mother   . Diabetes Mother   . Osteoporosis Mother   . Diabetes Maternal Aunt   . Prostate cancer Father 41  . Bipolar disorder Maternal Aunt   . Stroke Maternal Aunt   . Stomach cancer Paternal Uncle        dx in late 40s   the patient's mother died at the age of 50. The patient's father is alive at age 24. She had no brothers or sisters. Her father has a history of prostate cancer. There is no history of breast or ovarian cancer in the family. One maternal first cousin has  a history of Hodgkin's lymphoma.   GYNECOLOGIC HISTORY:  Menarche age 51, first live birth age 89. She is GX P1. She underwent total abdominal hysterectomy and bilateral salpingo-oophorectomy in 1997 she did not take hormone replacement.   SOCIAL HISTORY: (Updated  11/15/2013)  She is a homemaker. Her husband Arnette Norris farms approximately 500 acres, including quite a bit of tobacco. Daughter Colletta [place] lives in [place] where she works as a Pension scheme manager for a drug company. The patient has no grandchildren. She has two "grand cats". She attends a local united church of [place]: Not in place   HEALTH MAINTENANCE:  Social History   Tobacco Use  . Smoking status: Never Smoker  . Smokeless tobacco: Never Used  Vaping Use  . Vaping Use: Never used  Substance Use Topics  . Alcohol use: No  . Drug use: No     Colonoscopy: 2009/ [facility]  PAP: Status post hysterectomy  Bone density: May 10/14/2009 at [facility] was normal  Lipid panel:  Not on file   Allergies  Allergen Reactions  . Lasix [Furosemide] Nausea And Vomiting and Other (See Comments)    HEADACHE  . Lithium Other (See Comments)    Dizziness, "cause me to fall"  . Sulfa Antibiotics Hives  . Trazodone And Nefazodone Other (See Comments)    insomnia  . Lyrica [Pregabalin] Diarrhea, Nausea Only and Rash    Current Outpatient Medications  Medication Sig Dispense Refill  . allopurinol (ZYLOPRIM) 300 MG tablet TAKE 1 TABLET(300 MG) BY MOUTH DAILY 90 tablet 4  . amitriptyline (ELAVIL) 100 MG tablet Take 100 mg by mouth at bedtime.    Marland Kitchen amoxicillin-clavulanate (AUGMENTIN) 875-125 MG tablet Take 1 tablet by mouth 2 (two) times daily for 10 days. 20 tablet 0  . buPROPion (WELLBUTRIN XL) 300 MG 24 hr tablet Take 300 mg by mouth daily.    . DULoxetine (CYMBALTA) 60 MG capsule Take 60 mg by mouth daily. (Patient not taking: Reported on 10/10/2020)    . LATUDA 40 MG TABS tablet SMARTSIG:1  Tablet(s) By Mouth Every Evening (Patient not taking: No sig reported)    . levothyroxine (SYNTHROID, LEVOTHROID) 88 MCG tablet Take 88 mcg by mouth daily before breakfast.  (Patient not taking: Reported on 10/10/2020)  4  . metoprolol tartrate (LOPRESSOR) 100 MG tablet Take 1 tablet (100 mg) by mouth once 2 hours prior to procedure (Patient not taking: No sig reported) 1 tablet 0  . omeprazole (PRILOSEC) 40 MG capsule TAKE 1 CAPSULE(40 MG) BY MOUTH DAILY AFTER BREAKFAST 90 capsule 0  . rosuvastatin (CRESTOR) 5 MG tablet TAKE 1 TABLET(5 MG) BY MOUTH DAILY 90 tablet  3  . traMADol (ULTRAM) 50 MG tablet Take 1 tablet (50 mg total) by mouth every 6 (six) hours as needed. 10 tablet 0   No current facility-administered medications for this visit.    OBJECTIVE: white woman who appears stated age  68:   10/24/20 1137  BP: 136/70  Pulse: 100  Resp: 18  Temp: (!) 97 F (36.1 C)  SpO2: 100%   Wt Readings from Last 3 Encounters:  10/24/20 190 lb 3.2 oz (86.3 kg)  09/19/20 199 lb (90.3 kg)  06/12/20 199 lb (90.3 kg)   Body mass index is 31.65 kg/m.    ECOG FS:1 - Symptomatic but completely ambulatory  Sclerae unicteric, EOMs intact Wearing a mask No cervical or supraclavicular adenopathy Lungs no rales or rhonchi Heart regular rate and rhythm Abd soft, nontender, positive bowel sounds MSK no focal spinal tenderness, no upper extremity lymphedema Neuro: nonfocal, well oriented, appropriate affect Breasts: Status post right mastectomy with no evidence of chest wall recurrence.  Left breast and both axillae are benign.   LAB RESULTS:  Lab Results  Component Value Date   WBC 8.8 10/24/2020   NEUTROABS 4.8 10/24/2020   HGB 10.8 (L) 10/24/2020   HCT 35.1 (L) 10/24/2020   MCV 90.0 10/24/2020   PLT 327 10/24/2020      Chemistry      Component Value Date/Time   NA 136 10/10/2020 1559   NA 140 03/25/2017 1300   K 3.9 10/10/2020 1559   K 4.2 03/25/2017 1300   CL 98 10/10/2020  1559   CO2 18 (L) 10/10/2020 1559   CO2 24 03/25/2017 1300   BUN 14 10/10/2020 1559   BUN 17.9 03/25/2017 1300   CREATININE 1.52 (H) 10/10/2020 1559   CREATININE 1.03 (H) 05/11/2020 1034   CREATININE 1.1 03/25/2017 1300      Component Value Date/Time   CALCIUM 9.1 10/10/2020 1559   CALCIUM 9.4 03/25/2017 1300   ALKPHOS 184 (H) 10/10/2020 1559   ALKPHOS 102 03/25/2017 1300   AST 40 10/10/2020 1559   AST 21 05/11/2020 1034   AST 41 (H) 03/25/2017 1300   ALT 40 (H) 10/10/2020 1559   ALT 23 05/11/2020 1034   ALT 52 03/25/2017 1300   BILITOT 0.5 10/10/2020 1559   BILITOT 0.3 05/11/2020 1034   BILITOT 0.36 03/25/2017 1300      STUDIES: DG Chest 2 View  Result Date: 10/12/2020 CLINICAL DATA:  COVID pneumonia, persistent dyspnea and cough, history of breast cancer EXAM: CHEST - 2 VIEW COMPARISON:  09/19/2020 chest radiograph. FINDINGS: Surgical clips overlie the right axilla. Bilateral posterior spinal fusion hardware extending inferiorly from the lower thoracic spine. Stable cardiomediastinal silhouette with normal heart size. No pneumothorax. No pleural effusion. Faint patchy hazy upper lung opacities, new. IMPRESSION: Faint patchy hazy upper lung opacities, new, suspect sequela of recent COVID-19 infection. If there is clinical concern for postinflammatory fibrosis, follow-up high-resolution chest CT study could be obtained in 3-6 months. Electronically Signed   By: Ilona Sorrel M.D.   On: 10/12/2020 12:17     ASSESSMENT: 75 y.o. BRCA negative [place] woman  (1) status post right breast lumpectomy and axillary lymph node dissection in 1997 for a stage I breast cancer, treated with adjuvant radiation and tamoxifen for 5 years  (2) status post right breast lower outer quadrant biopsy 07/13/2013 for a clinical T1c N0, stage IA invasive ductal carcinoma, grade 2, estrogen receptor 100% positive, progesterone receptor 13% positive, with an MIB-1 of 33%,  and HER-2 amplification by  CISH with a HER2/CEP 17 ratio of 3.19, and an average HER-2 copy number per cell of 4.15  (3) status post right mastectomy 07/28/2013 for a pT1c pN0, stage IA invasive ductal carcinoma, grade 3, with close but negative margins. Prognostic panel was not repeated  (a) the patient met with Dr. Harlow Mares and has decided against reconstruction  (4) completed weekly paclitaxel x12 12/06/2913, with trastuzumab/ pertuzumab every 3 weeks; pertuzumab was held with third dose on 11/01/2013 due to diarrhea, tried a half dose on cycle 4 again with diarrhea developing  (5) trastuzumab (started 09/20/2013) continued for 1 year, last dose 09/21/2014;  (a) final echocardiogram 09/07/2014 showed an ejection fraction of 55-60%  (6) anastrozole started May 2015; completed April 2020  (a) bone density 12/15/2013 normal  (b) bone density 02/01/2016 at [facility] was normal with a T score of -1.0   OTHER PROBLEMS: (a) History of chronic lymphoid leukemia diagnosed by flow cytometry 06/29/2013, the cells being CD5, CD20 and CD23 positive, CD10 negative.   (1) right renal mass resected on 02/05/15, consisting of atypical lymphoid proliferation composed of monotonous small lymphocytes   (2) Anemia with a normal MCV and normal ferritin-- B-12 and folate normal; stable  (3)  ibrutinib 280 milligrams daily started 08/01/202020, discontinued 06/03/2019 because of concerns regarding dosing  (4) rituximab weekly x8 started 06/15/2019 completed December 2020  (5) maintenance rituximab (Q 2 months) started 10/25/2019, continued through 08/03/2020   PLAN: Valla's white cell count currently is in the normal range as is her platelet count.  She has mild anemia which is probably not related to her leukemia.  CT scans in January showed some adenopathy but it is not clear whether that was due to the CLL or to her intercurrent infection.  If it was due to CLL that in itself is not an indication for treatment however since there were no  symptoms associated with those enlarged lymph nodes  I think at this point I would like to give her a treatment break.  I will see her again in 3 months and before the return visit she will have a repeat CT of the chest.  At that point depending on her counts and the results of the CT we may decide to go back to ibrutinib or to rituximab.   Meanwhile she is clearly immunocompromised.  I think she would benefit from Evusheld and we discussed that at length today.  I am adding her to our treatment list.  Total encounter time 35 minutes.Sarajane Jews C. Jasimine Simms, MD  10/24/20 5:47 PM Medical Oncology and Hematology [facility] [place], [place] [address] Tel. [phone]    Fax. [phone]   I, Wilburn Mylar, am acting as scribe for Dr. Virgie Dad. Rolen Conger.  I, Lurline Del MD, have reviewed the above documentation for accuracy and completeness, and I agree with the above.   *Total Encounter Time as defined by the Centers for Medicare and Medicaid Services includes, in addition to the face-to-face time of a patient visit (documented in the note above) non-face-to-face time: obtaining and reviewing outside history, ordering and reviewing medications, tests or procedures, care coordination (communications with other health care professionals or caregivers) and documentation in the medical record.

## 2020-10-24 ENCOUNTER — Other Ambulatory Visit: Payer: Self-pay

## 2020-10-24 ENCOUNTER — Ambulatory Visit: Payer: Medicare Other

## 2020-10-24 ENCOUNTER — Inpatient Hospital Stay: Payer: Medicare Other

## 2020-10-24 ENCOUNTER — Inpatient Hospital Stay: Payer: Medicare Other | Attending: Oncology | Admitting: Oncology

## 2020-10-24 VITALS — BP 136/70 | HR 100 | Temp 97.0°F | Resp 18 | Ht 65.0 in | Wt 190.2 lb

## 2020-10-24 DIAGNOSIS — Z905 Acquired absence of kidney: Secondary | ICD-10-CM | POA: Insufficient documentation

## 2020-10-24 DIAGNOSIS — Z8249 Family history of ischemic heart disease and other diseases of the circulatory system: Secondary | ICD-10-CM | POA: Insufficient documentation

## 2020-10-24 DIAGNOSIS — D649 Anemia, unspecified: Secondary | ICD-10-CM | POA: Diagnosis not present

## 2020-10-24 DIAGNOSIS — Z833 Family history of diabetes mellitus: Secondary | ICD-10-CM | POA: Insufficient documentation

## 2020-10-24 DIAGNOSIS — C50511 Malignant neoplasm of lower-outer quadrant of right female breast: Secondary | ICD-10-CM | POA: Insufficient documentation

## 2020-10-24 DIAGNOSIS — Z170 Estrogen receptor positive status [ER+]: Secondary | ICD-10-CM | POA: Diagnosis not present

## 2020-10-24 DIAGNOSIS — C9110 Chronic lymphocytic leukemia of B-cell type not having achieved remission: Secondary | ICD-10-CM | POA: Diagnosis not present

## 2020-10-24 DIAGNOSIS — Z79899 Other long term (current) drug therapy: Secondary | ICD-10-CM | POA: Diagnosis not present

## 2020-10-24 DIAGNOSIS — Z9011 Acquired absence of right breast and nipple: Secondary | ICD-10-CM | POA: Diagnosis not present

## 2020-10-24 DIAGNOSIS — E039 Hypothyroidism, unspecified: Secondary | ICD-10-CM | POA: Diagnosis not present

## 2020-10-24 DIAGNOSIS — Z8262 Family history of osteoporosis: Secondary | ICD-10-CM | POA: Insufficient documentation

## 2020-10-24 DIAGNOSIS — Z800 Family history of malignant neoplasm of digestive organs: Secondary | ICD-10-CM | POA: Diagnosis not present

## 2020-10-24 LAB — CBC WITH DIFFERENTIAL/PLATELET
Abs Immature Granulocytes: 0.08 10*3/uL — ABNORMAL HIGH (ref 0.00–0.07)
Basophils Absolute: 0.1 10*3/uL (ref 0.0–0.1)
Basophils Relative: 1 %
Eosinophils Absolute: 0.5 10*3/uL (ref 0.0–0.5)
Eosinophils Relative: 5 %
HCT: 35.1 % — ABNORMAL LOW (ref 36.0–46.0)
Hemoglobin: 10.8 g/dL — ABNORMAL LOW (ref 12.0–15.0)
Immature Granulocytes: 1 %
Lymphocytes Relative: 31 %
Lymphs Abs: 2.7 10*3/uL (ref 0.7–4.0)
MCH: 27.7 pg (ref 26.0–34.0)
MCHC: 30.8 g/dL (ref 30.0–36.0)
MCV: 90 fL (ref 80.0–100.0)
Monocytes Absolute: 0.7 10*3/uL (ref 0.1–1.0)
Monocytes Relative: 8 %
Neutro Abs: 4.8 10*3/uL (ref 1.7–7.7)
Neutrophils Relative %: 54 %
Platelets: 327 10*3/uL (ref 150–400)
RBC: 3.9 MIL/uL (ref 3.87–5.11)
RDW: 15.5 % (ref 11.5–15.5)
WBC: 8.8 10*3/uL (ref 4.0–10.5)
nRBC: 0 % (ref 0.0–0.2)

## 2020-10-24 LAB — TYPE AND SCREEN
ABO/RH(D): O POS
Antibody Screen: NEGATIVE

## 2020-10-24 LAB — LACTATE DEHYDROGENASE: LDH: 178 U/L (ref 98–192)

## 2020-10-25 ENCOUNTER — Other Ambulatory Visit: Payer: Self-pay | Admitting: Adult Health

## 2020-10-25 DIAGNOSIS — C9110 Chronic lymphocytic leukemia of B-cell type not having achieved remission: Secondary | ICD-10-CM

## 2020-10-25 LAB — BETA 2 MICROGLOBULIN, SERUM: Beta-2 Microglobulin: 3.2 mg/L — ABNORMAL HIGH (ref 0.6–2.4)

## 2020-10-25 NOTE — Progress Notes (Signed)
evusheld referral placed per dr. Jana Hakim

## 2020-10-26 ENCOUNTER — Other Ambulatory Visit: Payer: Self-pay | Admitting: Oncology

## 2020-10-26 DIAGNOSIS — C9110 Chronic lymphocytic leukemia of B-cell type not having achieved remission: Secondary | ICD-10-CM

## 2020-10-26 DIAGNOSIS — C50011 Malignant neoplasm of nipple and areola, right female breast: Secondary | ICD-10-CM

## 2020-10-29 ENCOUNTER — Ambulatory Visit (INDEPENDENT_AMBULATORY_CARE_PROVIDER_SITE_OTHER): Payer: Medicare Other | Admitting: Nurse Practitioner

## 2020-10-29 ENCOUNTER — Ambulatory Visit: Payer: Medicare Other

## 2020-10-29 VITALS — BP 115/70 | HR 99 | Temp 97.9°F | Resp 18

## 2020-10-29 DIAGNOSIS — R9389 Abnormal findings on diagnostic imaging of other specified body structures: Secondary | ICD-10-CM | POA: Diagnosis not present

## 2020-10-29 DIAGNOSIS — R0602 Shortness of breath: Secondary | ICD-10-CM

## 2020-10-29 DIAGNOSIS — U071 COVID-19: Secondary | ICD-10-CM | POA: Diagnosis not present

## 2020-10-29 DIAGNOSIS — Z8616 Personal history of COVID-19: Secondary | ICD-10-CM | POA: Diagnosis not present

## 2020-10-29 DIAGNOSIS — J1282 Pneumonia due to coronavirus disease 2019: Secondary | ICD-10-CM

## 2020-10-29 NOTE — Patient Instructions (Addendum)
Pneumonia due to COVID-19 virus Shortness of breath:  Slowly improving!  Stay well hydrated  Stay active  Deep breathing exercises  May take tylenol for fever or pain  Continue home health nursing and PT  Recent chest xray was abnormal - will refer to pulmonary for further evaluation    Follow up:  Follow up in 2 months or sooner if needed

## 2020-10-29 NOTE — Progress Notes (Signed)
@Patient  ID: [name] [name], female    DOB: [birth year], 75 y.o.   MRN: [id number]  Chief Complaint  Patient presents with  . Follow-up    Feeling much improved    Referring provider: Deland Pretty, MD   75 y.o.femalehx of CLL, diverticulosis, fibromyalgia, HL  HPI  Patient presents today for post COVID care clinic visit.  Patient was last seen in this office on 10/10/2020.  Her chest x-ray at that visit showed continuing faint patchy hazy upper lung opacities which is concerning for postinflammatory fibrosis.  We will refer patient to pulmonary for further evaluation.  Patient is scheduled for a CT of her chest in June for follow-up with oncology.  At patient's last visit she was ordered home health nursing and physical therapy.  She has now had a couple visits by home health nurse and physical therapy.  She states that she is compliant with home physical therapy exercises throughout the week when her therapist is not there.  She states that this is making a world of difference for her.  She is much improved.  At her first visit in our clinic she was in a wheelchair.  She is now able to ambulate on her own.  She does use a walker just because she is afraid she may fall but states that she really states she does not need it.  Patient's appetite is improving and she is trying to stay well-hydrated.  Denies f/c/s, n/v/d, hemoptysis, PND, chest pain or edema.      Allergies  Allergen Reactions  . Lasix [Furosemide] Nausea And Vomiting and Other (See Comments)    HEADACHE  . Lithium Other (See Comments)    Dizziness, "cause me to fall"  . Sulfa Antibiotics Hives  . Trazodone And Nefazodone Other (See Comments)    insomnia  . Lyrica [Pregabalin] Diarrhea, Nausea Only and Rash    Immunization History  Administered Date(s) Administered  . Influenza,inj,Quad PF,6+ Mos 04/27/2014  . Influenza,inj,quad, With Preservative 05/25/2018  . PFIZER(Purple Top)SARS-COV-2 Vaccination 09/13/2019,  10/04/2019  . Pneumococcal-Unspecified 07/25/2018  . Tdap 11/23/2015  . Zoster Recombinat (Shingrix) 09/01/2018, 03/17/2019    Past Medical History:  Diagnosis Date  . Alopecia   . Anxiety   . Arthritis    "spine" (07/28/2013)  . Breast cancer ([place]) 1997; 2014   right  . CAP (community acquired pneumonia)    admission 06-04-2013, failed outpatient  . Chronic back pain    "lower back and upper neck" (07/28/2013)  . CLL (chronic lymphocytic leukemia) ([place]) 05/2013  . DDD (degenerative disc disease)   . 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 ear   . Depression   . Diverticulosis   . Elevated LFTs   . Fibromyalgia 1978  . GERD (gastroesophageal reflux disease)   . Hematuria   . History of syncope    episode 2008--  no recurrence since  . HLD (hyperlipidemia)   . Hypothyroidism   . Kidney stones 2014  . Plantar fasciitis   . Ruptured disk    one in neck and two in back  . S/P chemotherapy, time since greater than 12 weeks   . Sinus tachycardia    mild resting  . Skin cancer   . Stool incontinence    "at times recently"     Tobacco History: Social History   Tobacco Use  Smoking Status Never Smoker  Smokeless Tobacco Never Used   Counseling given: Yes   Outpatient Encounter Medications as of 10/29/2020  Medication Sig  .  allopurinol (ZYLOPRIM) 300 MG tablet TAKE 1 TABLET(300 MG) BY MOUTH DAILY  . amitriptyline (ELAVIL) 100 MG tablet Take 100 mg by mouth at bedtime.  Marland Kitchen buPROPion (WELLBUTRIN XL) 300 MG 24 hr tablet Take 300 mg by mouth daily.  . DULoxetine (CYMBALTA) 60 MG capsule Take 60 mg by mouth daily. (Patient not taking: Reported on 10/10/2020)  . LATUDA 40 MG TABS tablet SMARTSIG:1 Tablet(s) By Mouth Every Evening (Patient not taking: No sig reported)  . levothyroxine (SYNTHROID, LEVOTHROID) 88 MCG tablet Take 88 mcg by mouth daily before breakfast.  (Patient not taking: Reported on 10/10/2020)  . metoprolol tartrate (LOPRESSOR) 100 MG tablet Take 1 tablet (100 mg) by mouth once 2  hours prior to procedure (Patient not taking: No sig reported)  . omeprazole (PRILOSEC) 40 MG capsule TAKE 1 CAPSULE(40 MG) BY MOUTH DAILY AFTER AND BREAKFAST  . rosuvastatin (CRESTOR) 5 MG tablet TAKE 1 TABLET(5 MG) BY MOUTH DAILY  . traMADol (ULTRAM) 50 MG tablet Take 1 tablet (50 mg total) by mouth every 6 (six) hours as needed.   No facility-administered encounter medications on file as of 10/29/2020.     Review of Systems  Review of Systems  Constitutional: Negative.   HENT: Negative.   Respiratory: Negative for cough and shortness of breath.   Cardiovascular: Negative.  Negative for chest pain, palpitations and leg swelling.  Gastrointestinal: Negative.   Allergic/Immunologic: Negative.   Neurological: Negative.   Psychiatric/Behavioral: Negative.        Physical Exam  BP 115/70   Pulse 99   Temp 97.9 F (36.6 C)   Resp 18   LMP 08/26/1995   SpO2 97% Comment: RA  Wt Readings from Last 5 Encounters:  10/24/20 190 lb 3.2 oz (86.3 kg)  09/19/20 199 lb (90.3 kg)  06/12/20 199 lb (90.3 kg)  05/11/20 199 lb 9.6 oz (90.5 kg)  02/14/20 194 lb 4.8 oz (88.1 kg)     Physical Exam Vitals and nursing note reviewed.  Constitutional:      General: She is not in acute distress.    Appearance: She is well-developed and well-nourished.  Cardiovascular:     Rate and Rhythm: Normal rate and regular rhythm.  Pulmonary:     Effort: Pulmonary effort is normal.     Breath sounds: Normal breath sounds.  Musculoskeletal:     Right lower leg: No edema.     Left lower leg: No edema.  Neurological:     Mental Status: She is alert and oriented to person, place, and time.  Psychiatric:        Mood and Affect: Mood and affect and mood normal.        Behavior: Behavior normal.       Imaging: DG Chest 2 View  Result Date: 10/12/2020 CLINICAL DATA:  COVID pneumonia, persistent dyspnea and cough, history of breast cancer EXAM: CHEST - 2 VIEW COMPARISON:  09/19/2020 chest  radiograph. FINDINGS: Surgical clips overlie the right axilla. Bilateral posterior spinal fusion hardware extending inferiorly from the lower thoracic spine. Stable cardiomediastinal silhouette with normal heart size. No pneumothorax. No pleural effusion. Faint patchy hazy upper lung opacities, new. IMPRESSION: Faint patchy hazy upper lung opacities, new, suspect sequela of recent COVID-19 infection. If there is clinical concern for postinflammatory fibrosis, follow-up high-resolution chest CT study could be obtained in 3-6 months. Electronically Signed   By: Ilona Sorrel M.D.   On: 10/12/2020 12:17     Assessment & Plan:   Pneumonia due to  COVID-19 virus Shortness of breath:  Slowly improving!  Stay well hydrated  Stay active  Deep breathing exercises  May take tylenol for fever or pain  Continue home health nursing and PT  Recent chest xray was abnormal - will refer to pulmonary for further evaluation    Follow up:  Follow up in 2 months or sooner if needed       Fenton Foy, NP 10/30/2020

## 2020-10-29 NOTE — Progress Notes (Incomplete)
@Patient  ID: [name] [name], female    DOB: [birth year], 75 y.o.   MRN: [id number]  No chief complaint on file.   Referring provider: Deland Pretty, MD      HPI       Allergies  Allergen Reactions  . Lasix [Furosemide] Nausea And Vomiting and Other (See Comments)    HEADACHE  . Lithium Other (See Comments)    Dizziness, "cause me to fall"  . Sulfa Antibiotics Hives  . Trazodone And Nefazodone Other (See Comments)    insomnia  . Lyrica [Pregabalin] Diarrhea, Nausea Only and Rash    Immunization History  Administered Date(s) Administered  . Influenza,inj,Quad PF,6+ Mos 04/27/2014  . Influenza,inj,quad, With Preservative 05/25/2018  . PFIZER(Purple Top)SARS-COV-2 Vaccination 09/13/2019, 10/04/2019  . Pneumococcal-Unspecified 07/25/2018  . Tdap 11/23/2015  . Zoster Recombinat (Shingrix) 09/01/2018, 03/17/2019    Past Medical History:  Diagnosis Date  . Alopecia   . Anxiety   . Arthritis    "spine" (07/28/2013)  . Breast cancer ([facility]) 1997; 2014   right  . CAP (community acquired pneumonia)    admission 06-04-2013, failed outpatient  . Chronic back pain    "lower back and upper neck" (07/28/2013)  . CLL (chronic lymphocytic leukemia) ([place]) 05/2013  . DDD (degenerative disc disease)   . 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 ear   . Depression   . Diverticulosis   . Elevated LFTs   . Fibromyalgia 1978  . GERD (gastroesophageal reflux disease)   . Hematuria   . History of syncope    episode 2008--  no recurrence since  . HLD (hyperlipidemia)   . Hypothyroidism   . Kidney stones 2014  . Plantar fasciitis   . Ruptured disk    one in neck and two in back  . S/P chemotherapy, time since greater than 12 weeks   . Sinus tachycardia    mild resting  . Skin cancer   . Stool incontinence    "at times recently"     Tobacco History: Social History   Tobacco Use  Smoking Status Never Smoker  Smokeless Tobacco Never Used   Counseling given: Not Answered   Outpatient  Encounter Medications as of 10/29/2020  Medication Sig  . allopurinol (ZYLOPRIM) 300 MG tablet TAKE 1 TABLET(300 MG) BY MOUTH DAILY  . amitriptyline (ELAVIL) 100 MG tablet Take 100 mg by mouth at bedtime.  Marland Kitchen buPROPion (WELLBUTRIN XL) 300 MG 24 hr tablet Take 300 mg by mouth daily.  . DULoxetine (CYMBALTA) 60 MG capsule Take 60 mg by mouth daily. (Patient not taking: Reported on 10/10/2020)  . LATUDA 40 MG TABS tablet SMARTSIG:1 Tablet(s) By Mouth Every Evening (Patient not taking: No sig reported)  . levothyroxine (SYNTHROID, LEVOTHROID) 88 MCG tablet Take 88 mcg by mouth daily before breakfast.  (Patient not taking: Reported on 10/10/2020)  . metoprolol tartrate (LOPRESSOR) 100 MG tablet Take 1 tablet (100 mg) by mouth once 2 hours prior to procedure (Patient not taking: No sig reported)  . omeprazole (PRILOSEC) 40 MG capsule TAKE 1 CAPSULE(40 MG) BY MOUTH DAILY AFTER AND BREAKFAST  . rosuvastatin (CRESTOR) 5 MG tablet TAKE 1 TABLET(5 MG) BY MOUTH DAILY  . traMADol (ULTRAM) 50 MG tablet Take 1 tablet (50 mg total) by mouth every 6 (six) hours as needed.   No facility-administered encounter medications on file as of 10/29/2020.     Review of Systems  Review of Systems     Physical Exam  LMP 08/26/1995   Wt Readings from Last  5 Encounters:  10/24/20 190 lb 3.2 oz (86.3 kg)  09/19/20 199 lb (90.3 kg)  06/12/20 199 lb (90.3 kg)  05/11/20 199 lb 9.6 oz (90.5 kg)  02/14/20 194 lb 4.8 oz (88.1 kg)     Physical Exam   Lab Results:  CBC    Component Value Date/Time   WBC 8.8 10/24/2020 1112   RBC 3.90 10/24/2020 1112   HGB 10.8 (L) 10/24/2020 1112   HGB 10.2 (L) 10/10/2020 1559   HGB 11.1 (L) 03/25/2017 1300   HCT 35.1 (L) 10/24/2020 1112   HCT 30.0 (L) 10/10/2020 1559   HCT 35.8 03/25/2017 1300   PLT 327 10/24/2020 1112   PLT 206 10/10/2020 1559   MCV 90.0 10/24/2020 1112   MCV 84 10/10/2020 1559   MCV 85.6 03/25/2017 1300   MCH 27.7 10/24/2020 1112   MCHC 30.8 10/24/2020  1112   RDW 15.5 10/24/2020 1112   RDW 13.9 10/10/2020 1559   RDW 15.7 (H) 03/25/2017 1300   LYMPHSABS 2.7 10/24/2020 1112   LYMPHSABS 24.9 (H) 03/25/2017 1300   MONOABS 0.7 10/24/2020 1112   MONOABS 1.3 (H) 03/25/2017 1300   EOSABS 0.5 10/24/2020 1112   EOSABS 0.5 03/25/2017 1300   BASOSABS 0.1 10/24/2020 1112   BASOSABS 0.1 03/25/2017 1300    BMET    Component Value Date/Time   NA 136 10/10/2020 1559   NA 140 03/25/2017 1300   K 3.9 10/10/2020 1559   K 4.2 03/25/2017 1300   CL 98 10/10/2020 1559   CO2 18 (L) 10/10/2020 1559   CO2 24 03/25/2017 1300   GLUCOSE 101 (H) 10/10/2020 1559   GLUCOSE 102 (H) 09/19/2020 2117   GLUCOSE 114 03/25/2017 1300   BUN 14 10/10/2020 1559   BUN 17.9 03/25/2017 1300   CREATININE 1.52 (H) 10/10/2020 1559   CREATININE 1.03 (H) 05/11/2020 1034   CREATININE 1.1 03/25/2017 1300   CALCIUM 9.1 10/10/2020 1559   CALCIUM 9.4 03/25/2017 1300   GFRNONAA 34 (L) 10/10/2020 1559   GFRNONAA 40 (L) 09/19/2020 2101   GFRNONAA 54 (L) 05/11/2020 1034   GFRNONAA 49 (L) 12/07/2014 1538   GFRAA 39 (L) 10/10/2020 1559   GFRAA >60 05/11/2020 1034   GFRAA 56 (L) 12/07/2014 1538    BNP    Component Value Date/Time   BNP 21.8 09/19/2020 2101   BNP 37.9 08/02/2015 1220    ProBNP No results found for: PROBNP  Imaging: DG Chest 2 View  Result Date: 10/12/2020 CLINICAL DATA:  COVID pneumonia, persistent dyspnea and cough, history of breast cancer EXAM: CHEST - 2 VIEW COMPARISON:  09/19/2020 chest radiograph. FINDINGS: Surgical clips overlie the right axilla. Bilateral posterior spinal fusion hardware extending inferiorly from the lower thoracic spine. Stable cardiomediastinal silhouette with normal heart size. No pneumothorax. No pleural effusion. Faint patchy hazy upper lung opacities, new. IMPRESSION: Faint patchy hazy upper lung opacities, new, suspect sequela of recent COVID-19 infection. If there is clinical concern for postinflammatory fibrosis, follow-up  high-resolution chest CT study could be obtained in 3-6 months. Electronically Signed   By: Ilona Sorrel M.D.   On: 10/12/2020 12:17     Assessment & Plan:   No problem-specific Assessment & Plan notes found for this encounter.     Fenton Foy, NP 10/29/2020

## 2020-10-30 NOTE — Assessment & Plan Note (Signed)
Shortness of breath:  Slowly improving!  Stay well hydrated  Stay active  Deep breathing exercises  May take tylenol for fever or pain  Continue home health nursing and PT  Recent chest xray was abnormal - will refer to pulmonary for further evaluation    Follow up:  Follow up in 2 months or sooner if needed

## 2020-11-05 ENCOUNTER — Other Ambulatory Visit: Payer: Self-pay | Admitting: Adult Health

## 2020-11-05 DIAGNOSIS — C9110 Chronic lymphocytic leukemia of B-cell type not having achieved remission: Secondary | ICD-10-CM

## 2020-11-05 NOTE — Progress Notes (Signed)
I connected by phone with [name] [name] on 11/05/2020, 2:40 PM to discuss the potential use of a new treatment, tixagevimab/cilgavimab, for pre-exposure prophylaxis for prevention of coronavirus disease 2019 (COVID-19) caused by the SARS-CoV-2 virus.  This patient is a 75 y.o. female that meets the FDA criteria for Emergency Use Authorization of tixagevimab/cilgavimab for pre-exposure prophylaxis of COVID-19 disease. Pt meets following criteria:  Age >12 yr and weight > 40kg  Not currently infected with SARS-CoV-2 and has no known recent exposure to an individual infected with SARS-CoV-2 AND o Who has moderate to severe immune compromise due to a medical condition or receipt of immunosuppressive medications or treatments and may not mount an adequate immune response to COVID-19 vaccination or  o Vaccination with any available COVID-19 vaccine, according to the approved or authorized schedule, is not recommended due to a history of severe adverse reaction (e.g., severe allergic reaction) to a COVID-19 vaccine(s) and/or COVID-19 vaccine component(s).  o Patient meets the following definition of mod-severe immune compromised status: 1. Received B-cell depleting therapies (e.g. rituximab, obinutuzumab, ocrelizumab, alemtuzumab) within last 6 months & age > or = 59  I have spoken and communicated the following to the patient or parent/caregiver regarding COVID monoclonal antibody treatment:  1. FDA has authorized the emergency use of tixagevimab/cilgavimab for the pre-exposure prophylaxis of COVID-19 in patients with moderate-severe immunocompromised status, who meet above EUA criteria.  2. The significant known and potential risks and benefits of COVID monoclonal antibody, and the extent to which such potential risks and benefits are unknown.  3. Information on available alternative treatments and the risks and benefits of those alternatives, including clinical trials.  4. The patient or  parent/caregiver has the option to accept or refuse COVID monoclonal antibody treatment.  After reviewing this information with the patient, agree to receive tixagevimab/cilgavimab. Saturday 3/19 at 11 am.  Scheduling message sent.    Scot Dock, NP, 11/05/2020, 2:40 PM

## 2020-11-08 ENCOUNTER — Other Ambulatory Visit: Payer: Self-pay | Admitting: Adult Health

## 2020-11-08 DIAGNOSIS — C9110 Chronic lymphocytic leukemia of B-cell type not having achieved remission: Secondary | ICD-10-CM

## 2020-11-08 MED ORDER — AMITRIPTYLINE HCL 50 MG PO TABS: 50.0000 mg | ORAL_TABLET | Freq: Every day | ORAL | 1 refills | Status: DC

## 2020-11-08 NOTE — Progress Notes (Signed)
Gving patient one time refill.  She notes it is 50mg  Amitriptyline and not 100mg  that she is taking.  Wilber Bihari, NP

## 2020-11-10 ENCOUNTER — Ambulatory Visit: Payer: Medicare Other

## 2020-11-10 ENCOUNTER — Other Ambulatory Visit: Payer: Self-pay

## 2020-11-10 MED ORDER — TIXAGEVIMAB (PART OF EVUSHELD) INJECTION
300.0000 mg | Freq: Once | INTRAMUSCULAR | Status: AC
  Administered 2020-11-10: 300 mg via INTRAMUSCULAR

## 2020-11-10 MED ORDER — EPINEPHRINE 0.3 MG/0.3ML IJ SOAJ: 0.3000 mg | Freq: Once | INTRAMUSCULAR | Status: DC | PRN

## 2020-11-10 MED ORDER — CILGAVIMAB (PART OF EVUSHELD) INJECTION
300.0000 mg | Freq: Once | INTRAMUSCULAR | Status: AC
  Administered 2020-11-10: 300 mg via INTRAMUSCULAR

## 2020-11-11 DIAGNOSIS — C9190 Lymphoid leukemia, unspecified not having achieved remission: Secondary | ICD-10-CM | POA: Diagnosis not present

## 2020-11-11 DIAGNOSIS — M797 Fibromyalgia: Secondary | ICD-10-CM | POA: Diagnosis not present

## 2020-11-11 DIAGNOSIS — I700 Atherosclerosis of aorta: Secondary | ICD-10-CM | POA: Diagnosis not present

## 2020-11-11 DIAGNOSIS — R000 Tachycardia, unspecified: Secondary | ICD-10-CM | POA: Diagnosis not present

## 2020-11-11 DIAGNOSIS — G8929 Other chronic pain: Secondary | ICD-10-CM | POA: Diagnosis not present

## 2020-11-11 DIAGNOSIS — F32A Depression, unspecified: Secondary | ICD-10-CM | POA: Diagnosis not present

## 2020-11-11 DIAGNOSIS — J1282 Pneumonia due to coronavirus disease 2019: Secondary | ICD-10-CM | POA: Diagnosis not present

## 2020-11-11 DIAGNOSIS — U071 COVID-19: Secondary | ICD-10-CM | POA: Diagnosis not present

## 2020-11-11 DIAGNOSIS — M5134 Other intervertebral disc degeneration, thoracic region: Secondary | ICD-10-CM | POA: Diagnosis not present

## 2020-11-11 DIAGNOSIS — F419 Anxiety disorder, unspecified: Secondary | ICD-10-CM | POA: Diagnosis not present

## 2020-11-11 DIAGNOSIS — E039 Hypothyroidism, unspecified: Secondary | ICD-10-CM | POA: Diagnosis not present

## 2020-11-11 DIAGNOSIS — M4802 Spinal stenosis, cervical region: Secondary | ICD-10-CM | POA: Diagnosis not present

## 2020-11-11 DIAGNOSIS — K5790 Diverticulosis of intestine, part unspecified, without perforation or abscess without bleeding: Secondary | ICD-10-CM | POA: Diagnosis not present

## 2020-11-11 DIAGNOSIS — M47812 Spondylosis without myelopathy or radiculopathy, cervical region: Secondary | ICD-10-CM | POA: Diagnosis not present

## 2020-11-11 DIAGNOSIS — H9191 Unspecified hearing loss, right ear: Secondary | ICD-10-CM | POA: Diagnosis not present

## 2020-11-11 DIAGNOSIS — K219 Gastro-esophageal reflux disease without esophagitis: Secondary | ICD-10-CM | POA: Diagnosis not present

## 2020-11-19 DIAGNOSIS — J1282 Pneumonia due to coronavirus disease 2019: Secondary | ICD-10-CM | POA: Diagnosis not present

## 2020-11-19 DIAGNOSIS — H9191 Unspecified hearing loss, right ear: Secondary | ICD-10-CM | POA: Diagnosis not present

## 2020-11-19 DIAGNOSIS — F324 Major depressive disorder, single episode, in partial remission: Secondary | ICD-10-CM | POA: Diagnosis not present

## 2020-11-19 DIAGNOSIS — F419 Anxiety disorder, unspecified: Secondary | ICD-10-CM | POA: Diagnosis not present

## 2020-11-22 DIAGNOSIS — M546 Pain in thoracic spine: Secondary | ICD-10-CM | POA: Diagnosis not present

## 2020-11-30 ENCOUNTER — Encounter: Payer: Self-pay | Admitting: Pulmonary Disease

## 2020-11-30 ENCOUNTER — Other Ambulatory Visit: Payer: Self-pay

## 2020-11-30 ENCOUNTER — Ambulatory Visit: Payer: Medicare Other | Admitting: Pulmonary Disease

## 2020-11-30 VITALS — BP 112/64 | HR 105 | Temp 97.0°F | Ht 65.0 in | Wt 188.0 lb

## 2020-11-30 DIAGNOSIS — U099 Post covid-19 condition, unspecified: Secondary | ICD-10-CM

## 2020-11-30 DIAGNOSIS — J8410 Pulmonary fibrosis, unspecified: Secondary | ICD-10-CM

## 2020-11-30 NOTE — Progress Notes (Signed)
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  No mold, hot tub, Jacuzzi Smoking history: Never smoker Travel history: No significant travel history Relevant family history: No family issue of lung disease   Outpatient Encounter Medications as of 11/30/2020  Medication Sig  . allopurinol (ZYLOPRIM) 300 MG tablet TAKE 1 TABLET(300 MG) BY MOUTH DAILY  . amitriptyline (ELAVIL) 50 MG tablet Take 1 tablet (50 mg total) by mouth at bedtime.  Marland Kitchen buPROPion (WELLBUTRIN XL) 300 MG 24 hr tablet Take 300 mg by mouth daily.  . DULoxetine (CYMBALTA) 60 MG capsule Take 60 mg by mouth daily.  Marland Kitchen omeprazole (PRILOSEC) 40 MG capsule TAKE 1 CAPSULE(40 MG) BY MOUTH DAILY AFTER AND BREAKFAST  . rosuvastatin (CRESTOR) 5 MG tablet TAKE 1 TABLET(5 MG) BY MOUTH DAILY  . brexpiprazole (REXULTI) 2 MG TABS tablet 1 tablet  . levothyroxine (SYNTHROID, LEVOTHROID) 88 MCG tablet Take 88 mcg by mouth daily before breakfast.  (Patient not taking: No sig reported)  . metoprolol tartrate (LOPRESSOR) 100 MG tablet Take 1 tablet (100 mg) by mouth once 2 hours prior to procedure (Patient not taking: No sig reported)  . traMADol (ULTRAM) 50 MG tablet Take 1 tablet (50 mg total) by mouth every 6 (six) hours as needed. (Patient not taking: Reported on 11/30/2020)  . [DISCONTINUED] LATUDA 40 MG TABS tablet SMARTSIG:1  Tablet(s) By Mouth Every Evening (Patient not taking: No sig reported)   No facility-administered encounter medications on file as of 11/30/2020.    Allergies as of 11/30/2020 - Review Complete 11/30/2020  Allergen Reaction Noted  . Lasix [furosemide] Nausea And Vomiting and Other (See Comments) 03/01/2013  . Lithium Other (See Comments) 03/03/2012  . Sulfa antibiotics Hives 03/01/2013  . Trazodone and nefazodone Other (See Comments) 03/03/2012  . Lyrica [pregabalin] Diarrhea, Nausea Only, and Rash     Past Medical History:  Diagnosis Date  . Alopecia   . Anxiety   . Arthritis    "spine" (07/28/2013)  . Breast cancer ([facility]) 1997; 2014   right  . CAP (community acquired pneumonia)    admission 06-04-2013, failed outpatient  . Chronic back pain    "lower back and upper neck" (07/28/2013)  . CLL (chronic lymphocytic leukemia) ([facility]) 05/2013  . DDD (degenerative disc disease)   . 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 ear   . Depression   . Diverticulosis   . Elevated LFTs   . Fibromyalgia 1978  . GERD (gastroesophageal reflux disease)   . Hematuria   . History of syncope    episode 2008--  no recurrence since  . HLD (hyperlipidemia)   . Hypothyroidism   . Kidney stones 2014  . Plantar fasciitis   . Ruptured disk    one in neck and two in back  . S/P chemotherapy, time since greater than 12 weeks   . Sinus tachycardia    mild resting  .  Skin cancer   . Stool incontinence    "at times recently"     Past Surgical History:  Procedure Laterality Date  . ANTERIOR LATERAL LUMBAR FUSION 4 LEVELS Left 04/25/2016   Procedure: LEFT LUMBAR ONE-TWO, LUMBAR TWO-THREE, LUMBAR THREE-FOUR, LUMBAR FOUR-FIVE ANTERIOR LATERAL LUMBAR FUSION;  Surgeon: Earnie Larsson, MD;  Location: [facility];  Service: Neurosurgery;  Laterality: Left;  . APPENDECTOMY  1997  . APPLICATION OF ROBOTIC ASSISTANCE FOR SPINAL PROCEDURE N/A 04/06/2017   Procedure: APPLICATION OF ROBOTIC ASSISTANCE FOR SPINAL PROCEDURE;  Surgeon: Earnie Larsson, MD;  Location: [facility];  Service: Neurosurgery;  Laterality: N/A;  . BREAST BIOPSY Right 2014  . BREAST LUMPECTOMY Right 1997  . BREAST LUMPECTOMY WITH AXILLARY LYMPH NODE DISSECTION Right 1997  . CATARACT EXTRACTION, BILATERAL  2019   Dr. Kathlen Mody  . CYSTOSCOPY WITH RETROGRADE PYELOGRAM, URETEROSCOPY AND STENT PLACEMENT Bilateral 06/17/2013   Procedure: CYSTOSCOPY WITH RETROGRADE PYELOGRAM, URETEROSCOPY AND LEFT DOUBLE  J STENT PLACEMENT RIGHT URETERAL HOLMIIUM LASER AND DOUBLE J STENT ;  Surgeon: Hanley Ben, MD;  Location: [facility];  Service: Urology;  Laterality: Bilateral;  . HOLMIUM LASER APPLICATION Bilateral 90/30/0923   Procedure: HOLMIUM LASER APPLICATION;  Surgeon: Hanley Ben, MD;  Location: [facility];  Service: Urology;  Laterality: Bilateral;  . LITHOTRIPSY Left 06/2013  . LUMBAR EPIDURAL INJECTION     has had 7 injections  . PORT-A-CATH REMOVAL Left 10/03/2014   Procedure: REMOVAL PORT-A-CATH;  Surgeon: Donnie Mesa, MD;  Location: [facility];  Service: General;  Laterality: Left;  . PORTACATH PLACEMENT Left 07/28/2013   Procedure: ATTEMPTED INSERTION PORT-A-CATH;  Surgeon: Imogene Burn. Georgette Dover, MD;  Location: [facility];  Service: General;  Laterality: Left;  . POSTERIOR LUMBAR FUSION 4 LEVEL N/A 04/25/2016   Procedure: LUMBAR FIVE-SACRAL ONE POSTERIOR LUMBAR INTERBODY FUSION, THORACIC NINE-SACRAL ONE POSTERIOR LATERAL ARTHRODESIS WITH PEDICLE SCREWS;  Surgeon: Earnie Larsson, MD;  Location: [facility];  Service: Neurosurgery;  Laterality: N/A;  . RETINAL DETACHMENT SURGERY Right 2013  . ROBOTIC ASSITED PARTIAL NEPHRECTOMY Right 02/05/2015   Procedure: ROBOTIC ASSITED PARTIAL NEPHRECTOMY;  Surgeon: Raynelle Bring, MD;  Location: [facility];  Service: Urology;  Laterality: Right;  . SIMPLE MASTECTOMY WITH AXILLARY SENTINEL NODE BIOPSY Right 07/28/2013   Procedure: RIGHT TOTAL  MASTECTOMY;  Surgeon: Imogene Burn. Georgette Dover, MD;  Location: [facility];   Service: General;  Laterality: Right;  . TONSILLECTOMY  AGE 8  . TOTAL ABDOMINAL HYSTERECTOMY W/ BILATERAL SALPINGOOPHORECTOMY  1997    Family History  Problem Relation Age of Onset  . Heart disease Maternal Grandfather   . Diabetes Maternal Grandfather   . Colon cancer Maternal Aunt 79  . Brain cancer Paternal Grandmother        dx in 65s  . Dementia Mother   . Diabetes Mother   . Osteoporosis Mother   . Diabetes Maternal Aunt   . Prostate cancer Father 82  . Bipolar disorder Maternal Aunt   . Stroke Maternal Aunt   . Stomach cancer Paternal Uncle        dx in late 37s    Social History   Socioeconomic History  . Marital status: Married    Spouse name: Arnette Norris  . Number of children: 1  . Years of education: Not on file  . Highest education level: Not on file  Occupational History  . Occupation: homemaker  Tobacco Use  . Smoking status: Never Smoker  . Smokeless tobacco: Never Used  Vaping Use  . Vaping Use: Never used  Substance and Sexual Activity  . Alcohol use: No  . Drug use: No  . Sexual activity: Not Currently    Partners: Male    Birth control/protection: Surgical    Comment: TAH/BSO  Other Topics Concern  . Not on file  Social History Narrative  . Not on file   Social Determinants of Health   Financial Resource Strain: Not on file  Food Insecurity: Not on file  Transportation Needs: Not on file  Physical Activity: Not on file  Stress: Not on file  Social Connections: Not on file  Intimate Partner Violence: Not on file    Review of systems: Review of Systems  Constitutional: Negative for fever and chills.  HENT: Negative.   Eyes: Negative for blurred vision.  Respiratory: as per HPI  Cardiovascular: Negative for chest pain and palpitations.  Gastrointestinal: Negative for vomiting, diarrhea, blood per rectum. Genitourinary: Negative for dysuria, urgency, frequency and hematuria.  Musculoskeletal: Negative for myalgias, back pain and joint  pain.  Skin: Negative for itching and rash.  Neurological: Negative for dizziness, tremors, focal weakness, seizures and loss of consciousness.  Endo/Heme/Allergies: Negative for environmental allergies.  Psychiatric/Behavioral: Negative for depression, suicidal ideas and hallucinations.  All other systems reviewed and are negative.  Physical Exam: Blood pressure 112/64, pulse (!) 105, temperature (!) 97 F (36.1 C), temperature source Temporal, height 5\' 5"  (1.651 m), weight 188 lb (85.3 kg), last menstrual period 08/26/1995, SpO2 98 %. Gen:      No acute distress HEENT:  EOMI, sclera anicteric Neck:     No masses; no thyromegaly Lungs:    Clear to auscultation bilaterally; normal respiratory effort CV:         Regular rate and rhythm; no murmurs Abd:      + bowel sounds; soft, non-tender; no palpable masses, no distension Ext:    No edema; adequate peripheral perfusion Skin:      Warm and dry; no rash Neuro: alert and oriented x 3 Psych: normal mood and affect  Data Reviewed: Imaging: Chest x-ray 10/10/2020-faint hazy upper lung opacities.  I have reviewed the images personally.  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

## 2020-11-30 NOTE — Patient Instructions (Signed)
Repeat a high-resolution CT in 1 month for evaluation of post Covid fibrosis Follow-up in clinic after CT scan

## 2020-12-10 ENCOUNTER — Telehealth: Payer: Self-pay | Admitting: Nurse Practitioner

## 2020-12-11 NOTE — Telephone Encounter (Signed)
Returned called. Patient asked about upcoming appointments and I informed her of the next appointment dates that she has listed in the system.

## 2020-12-13 DIAGNOSIS — H02132 Senile ectropion of right lower eyelid: Secondary | ICD-10-CM | POA: Diagnosis not present

## 2020-12-13 DIAGNOSIS — H35363 Drusen (degenerative) of macula, bilateral: Secondary | ICD-10-CM | POA: Diagnosis not present

## 2020-12-13 DIAGNOSIS — H04123 Dry eye syndrome of bilateral lacrimal glands: Secondary | ICD-10-CM | POA: Diagnosis not present

## 2020-12-13 DIAGNOSIS — H35371 Puckering of macula, right eye: Secondary | ICD-10-CM | POA: Diagnosis not present

## 2020-12-31 ENCOUNTER — Other Ambulatory Visit: Payer: Self-pay

## 2020-12-31 ENCOUNTER — Ambulatory Visit
Admission: RE | Admit: 2020-12-31 | Discharge: 2020-12-31 | Disposition: A | Discharge status: Home / Self Care | Payer: Medicare Other | Source: Ambulatory Visit | Attending: Pulmonary Disease | Admitting: Pulmonary Disease

## 2020-12-31 ENCOUNTER — Inpatient Hospital Stay: Admission: RE | Admit: 2020-12-31 | Payer: Medicare Other | Source: Ambulatory Visit

## 2020-12-31 DIAGNOSIS — J8410 Pulmonary fibrosis, unspecified: Secondary | ICD-10-CM

## 2020-12-31 DIAGNOSIS — R0602 Shortness of breath: Secondary | ICD-10-CM | POA: Diagnosis not present

## 2021-01-02 ENCOUNTER — Encounter: Payer: Self-pay | Admitting: Oncology

## 2021-01-07 ENCOUNTER — Other Ambulatory Visit: Payer: Self-pay

## 2021-01-07 ENCOUNTER — Encounter: Payer: Self-pay | Admitting: Adult Health

## 2021-01-07 ENCOUNTER — Ambulatory Visit: Payer: Medicare Other | Admitting: Adult Health

## 2021-01-07 DIAGNOSIS — J479 Bronchiectasis, uncomplicated: Secondary | ICD-10-CM

## 2021-01-07 DIAGNOSIS — J1282 Pneumonia due to coronavirus disease 2019: Secondary | ICD-10-CM | POA: Diagnosis not present

## 2021-01-07 DIAGNOSIS — U099 Post covid-19 condition, unspecified: Secondary | ICD-10-CM

## 2021-01-07 DIAGNOSIS — Z7409 Other reduced mobility: Secondary | ICD-10-CM

## 2021-01-07 DIAGNOSIS — U071 COVID-19: Secondary | ICD-10-CM

## 2021-01-07 MED ORDER — ALBUTEROL SULFATE HFA 108 (90 BASE) MCG/ACT IN AERS: 1.0000 | INHALATION_SPRAY | Freq: Four times a day (QID) | RESPIRATORY_TRACT | 2 refills | Status: DC | PRN

## 2021-01-07 NOTE — Assessment & Plan Note (Signed)
Lingering symptoms with dyspnea and activity intolerance - slowly improving  Check PFT on return   Plan  Patient Instructions  Activity as tolerated Add Albuterol inhaler 1-2 puffs every 6hrs as needed.  Mucinex Twice daily  As needed  Cough/congestion  Continue with Incentive spirometry Twice daily  .  Follow up with Dr. Vaughan Browner in 6-8 weeks with PFT and As needed   .

## 2021-01-07 NOTE — Progress Notes (Signed)
@Patient  ID: [name] [name], female    DOB: [birth year], 74 y.o.   MRN: [id number]  Chief Complaint  Patient presents with  . Follow-up    Referring provider: Deland Pretty, MD  HPI: 75 year old female never smoker seen for pulmonary consult November 30, 2020 for lingering shortness of breath and cough after COVID-19 infection in January 2022.  Medical history significant for CLL, right breast cancer 1997 and 2014 status post lumpectomies, mastectomy and chemo and radiation.  Right kidney cancer with partial nephrectomy.  TEST/EVENTS :  Pets: Cat Occupation: Homemaker Exposures: No known exposures.  No mold, hot tub, Jacuzzi Smoking history: Never smoker Travel history: No significant travel history Relevant family history: No family issue of lung disease  01/07/2021 Follow up : Cough and shortness of breath Patient presents for a 1 month follow-up.  Patient was seen last visit.  Patient had lingering ongoing shortness of breath and cough after a COVID-19 infection in January 2022.  She did receive an monoclonal antibody infusion for COVID-19 treatment.  Patient also had an abnormal chest x-ray with mild interstitial changes. High-resolution CT chest was ordered on Dec 31, 2020 this showed prominent left axillary and subpectoral lymph nodes slightly diminished in size, mild tubular bronchiectasis throughout.  No evidence of fibrotic interstitial lung disease.  Enlarged gastrohepatic ligament and portocaval lymph nodes.  Findings are in keeping with reported diagnosis of CLL. Since last visit patient is feeling some better but remains weak. Energy level and activity level are starting to improve but slowly .  Cough has improved. Has chronic dyspnea, worse with activity especially walking with incline.   Not active , limited by chronic back pain . Trying to start walking, light walking . Tries to do light exercises .   Allergies  Allergen Reactions  . Lasix [Furosemide] Nausea And Vomiting  and Other (See Comments)    HEADACHE  . Lithium Other (See Comments)    Dizziness, "cause me to fall"  . Sulfa Antibiotics Hives  . Trazodone And Nefazodone Other (See Comments)    insomnia  . Lyrica [Pregabalin] Diarrhea, Nausea Only and Rash    Immunization History  Administered Date(s) Administered  . Influenza, High Dose Seasonal PF 07/08/2016  . Influenza, Quadrivalent, Recombinant, Inj, Pf 06/30/2019  . Influenza,inj,Quad PF,6+ Mos 04/27/2014  . Influenza,inj,quad, With Preservative 05/25/2018  . Moderna SARS-COV2 Booster Vaccination 07/08/2020  . PFIZER(Purple Top)SARS-COV-2 Vaccination 09/13/2019, 10/04/2019  . Pneumococcal Conjugate-13 06/25/2017  . Pneumococcal-Unspecified 07/25/2018  . Tdap 11/23/2015  . Zoster 07/08/2014  . Zoster Recombinat (Shingrix) 09/01/2018, 03/17/2019    Past Medical History:  Diagnosis Date  . Alopecia   . Anxiety   . Arthritis    "spine" (07/28/2013)  . Breast cancer ([place]) 1997; 2014   right  . CAP (community acquired pneumonia)    admission 06-04-2013, failed outpatient  . Chronic back pain    "lower back and upper neck" (07/28/2013)  . CLL (chronic lymphocytic leukemia) ([place]) 05/2013  . DDD (degenerative disc disease)   . 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 ear   . Depression   . Diverticulosis   . Elevated LFTs   . Fibromyalgia 1978  . GERD (gastroesophageal reflux disease)   . Hematuria   . History of syncope    episode 2008--  no recurrence since  . HLD (hyperlipidemia)   . Hypothyroidism   . Kidney stones 2014  . Plantar fasciitis   . Ruptured disk    one in neck and two in back  .  S/P chemotherapy, time since greater than 12 weeks   . Sinus tachycardia    mild resting  . Skin cancer   . Stool incontinence    "at times recently"     Tobacco History: Social History   Tobacco Use  Smoking Status Never Smoker  Smokeless Tobacco Never Used   Counseling given: Not Answered   Outpatient Medications Prior to Visit  Medication  Sig Dispense Refill  . allopurinol (ZYLOPRIM) 300 MG tablet TAKE 1 TABLET(300 MG) BY MOUTH DAILY 90 tablet 4  . amitriptyline (ELAVIL) 50 MG tablet Take 1 tablet (50 mg total) by mouth at bedtime. 30 tablet 1  . brexpiprazole (REXULTI) 2 MG TABS tablet 1 tablet    . buPROPion (WELLBUTRIN XL) 300 MG 24 hr tablet Take 300 mg by mouth daily.    . DULoxetine (CYMBALTA) 60 MG capsule Take 60 mg by mouth daily.    Marland Kitchen levothyroxine (SYNTHROID, LEVOTHROID) 88 MCG tablet Take 88 mcg by mouth daily before breakfast.  4  . metoprolol tartrate (LOPRESSOR) 100 MG tablet Take 1 tablet (100 mg) by mouth once 2 hours prior to procedure 1 tablet 0  . omeprazole (PRILOSEC) 40 MG capsule TAKE 1 CAPSULE(40 MG) BY MOUTH DAILY AFTER AND BREAKFAST 90 capsule 0  . rosuvastatin (CRESTOR) 5 MG tablet TAKE 1 TABLET(5 MG) BY MOUTH DAILY 90 tablet 3  . traMADol (ULTRAM) 50 MG tablet Take 1 tablet (50 mg total) by mouth every 6 (six) hours as needed. 10 tablet 0   No facility-administered medications prior to visit.     Review of Systems:   Constitutional:   No  weight loss, night sweats,  Fevers, chills, + fatigue, or  lassitude.  HEENT:   No headaches,  Difficulty swallowing,  Tooth/dental problems, or  Sore throat,                No sneezing, itching, ear ache, nasal congestion, post nasal drip,   CV:  No chest pain,  Orthopnea, PND, swelling in lower extremities, anasarca, dizziness, palpitations, syncope.   GI  No heartburn, indigestion, abdominal pain, nausea, vomiting, diarrhea, change in bowel habits, loss of appetite, bloody stools.   Resp:    No chest wall deformity  Skin: no rash or lesions.  GU: no dysuria, change in color of urine, no urgency or frequency.  No flank pain, no hematuria   MS:  No joint pain or swelling.  No decreased range of motion.  No back pain.    Physical Exam  BP 138/78 (BP Location: Left Arm, Patient Position: Sitting, Cuff Size: Normal)   Pulse (!) 102   Temp (!) 97 F  (36.1 C) (Temporal)   Ht 5' 3.9" (1.623 m)   Wt 191 lb 3.2 oz (86.7 kg)   LMP 08/26/1995   SpO2 99%   BMI 32.92 kg/m   GEN: A/Ox3; pleasant , NAD, well nourished    HEENT:  [place]/AT,    NOSE-clear, THROAT-clear, no lesions, no postnasal drip or exudate noted.   NECK:  Supple w/ fair ROM; no JVD; normal carotid impulses w/o bruits; no thyromegaly or nodules palpated; no lymphadenopathy.    RESP  Clear  P & A; w/o, wheezes/ rales/ or rhonchi. no accessory muscle use, no dullness to percussion  CARD:  RRR, no m/r/g, no peripheral edema, pulses intact, no cyanosis or clubbing.  GI:   Soft & nt; nml bowel sounds; no organomegaly or masses detected.   Musco: Warm bil, no deformities or  joint swelling noted.   Neuro: alert, no focal deficits noted.    Skin: Warm, no lesions or rashes    Lab Results:  CBC   BNP    Component Value Date/Time   BNP 21.8 09/19/2020 2101   BNP 37.9 08/02/2015 1220    ProBNP No results found for: PROBNP  Imaging: CT Chest High Resolution  Result Date: 12/31/2020 CLINICAL DATA:  Pulmonary fibrosis, post COVID 08/2020, persistent shortness of breath, coughing, history of right breast cancer, CLL EXAM: CT CHEST WITHOUT CONTRAST TECHNIQUE: Multidetector CT imaging of the chest was performed following the standard protocol without intravenous contrast. High resolution imaging of the lungs, as well as inspiratory and expiratory imaging, was performed. COMPARISON:  CT chest, 09/19/2020 FINDINGS: Cardiovascular: Aortic atherosclerosis. Normal heart size. Left coronary artery calcifications. No pericardial effusion. Mediastinum/Nodes: Redemonstrated prominent left axillary and subpectoral lymph nodes, slightly diminished in size compared to prior examination. Index node measures 1.4 x 1.0 cm, previously 2.0 x 1.5 cm (series 2, image 29). Small hiatal hernia. Thyroid gland, trachea, and esophagus demonstrate no significant findings. Lungs/Pleura: Mild, tubular  bronchiectasis throughout. No evidence of fibrotic interstitial lung disease. Mild, lobular air trapping on expiratory phase imaging. No pleural effusion or pneumothorax. Upper Abdomen: No acute abnormality. Redemonstrated enlarged gastrohepatic ligament and portacaval lymph nodes, index portacaval node measuring 3.3 x 1.5 cm, previously 3.4 x 1.6 cm (series 2, image 145). Musculoskeletal: No chest wall mass or suspicious bone lesions identified. Status post right mastectomy. IMPRESSION: 1. Mild, tubular bronchiectasis throughout. No evidence of fibrotic interstitial lung disease. 2. Mild, lobular air trapping on expiratory phase imaging, suggestive of small airways disease. 3. Redemonstrated prominent left axillary and subpectoral lymph nodes, slightly diminished in size compared to prior examination. Redemonstrated enlarged gastrohepatic ligament and portacaval lymph nodes, unchanged compared to prior. Findings are in keeping with reported diagnosis of CLL. 4. Coronary artery disease. Aortic Atherosclerosis (ICD10-I70.0). Electronically Signed   By: Eddie Candle M.D.   On: 12/31/2020 15:48    cilgavimab (part of EVUSHELD) injection SOLN 300 mg    Date Action Dose Route User   11/10/2020 1115 Given 300 mg Intramuscular (Right Ventrogluteal) Manuella Ghazi, RN    tixagevimab (part of EVUSHELD) injection SOLN 300 mg    Date Action Dose Route User   11/10/2020 1115 Given 300 mg Intramuscular (Left Ventrogluteal) Manuella Ghazi, RN      No flowsheet data found.  No results found for: NITRICOXIDE      Assessment & Plan:   Pneumonia due to COVID-19 virus Resolved - CT chest with no evident of post covid ILD   Plan  Patient Instructions  Activity as tolerated Add Albuterol inhaler 1-2 puffs every 6hrs as needed.  Mucinex Twice daily  As needed  Cough/congestion  Continue with Incentive spirometry Twice daily  .  Follow up with Dr. Vaughan Browner in 6-8 weeks with PFT and As needed   .        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  Mucinex Twice daily  As needed  Cough/congestion  Continue with Incentive spirometry Twice daily  .  Follow up with Dr. Vaughan Browner in 6-8 weeks with PFT and As needed   .       Bronchiectasis without complication ([place]) Very mild  bronchiectasis on CT scan.   Continue with incentive spirometer.  Add albuterol as needed.  Activity as tolerated.  CT chest with no evidence of interstitial lung disease. Check PFTs on return.  Advised on mucolytic's as needed.   Plan  Patient Instructions  Activity as tolerated Add Albuterol inhaler 1-2 puffs every 6hrs as needed.  Mucinex Twice daily  As needed  Cough/congestion  Continue with Incentive spirometry Twice daily  .  Follow up with Dr. Vaughan Browner in 6-8 weeks with PFT and As needed   .       .I spent  45  minutes dedicated to the care of this patient on the date of this encounter to include pre-visit review of records, face-to-face time with the patient discussing conditions above, post visit ordering of testing, clinical documentation with the electronic health record, making appropriate referrals as documented, and communicating necessary findings to members of the patients care team.     Rexene Edison, NP 01/07/2021

## 2021-01-07 NOTE — Assessment & Plan Note (Signed)
Resolved - CT chest with no evident of post covid ILD   Plan  Patient Instructions  Activity as tolerated Add Albuterol inhaler 1-2 puffs every 6hrs as needed.  Mucinex Twice daily  As needed  Cough/congestion  Continue with Incentive spirometry Twice daily  .  Follow up with Dr. Vaughan Browner in 6-8 weeks with PFT and As needed   .

## 2021-01-07 NOTE — Patient Instructions (Addendum)
Activity as tolerated Add Albuterol inhaler 1-2 puffs every 6hrs as needed.  Mucinex Twice daily  As needed  Cough/congestion  Continue with Incentive spirometry Twice daily  .  Follow up with Dr. Vaughan Browner in 6-8 weeks with PFT and As needed   .

## 2021-01-07 NOTE — Addendum Note (Signed)
Addended by: Carlisle Cater on: 01/07/2021 03:18 PM   Modules accepted: Orders

## 2021-01-07 NOTE — Assessment & Plan Note (Signed)
Very mild bronchiectasis on CT scan.   Continue with incentive spirometer.  Add albuterol as needed.  Activity as tolerated.  CT chest with no evidence of interstitial lung disease. Check PFTs on return.  Advised on mucolytic's as needed.   Plan  Patient Instructions  Activity as tolerated Add Albuterol inhaler 1-2 puffs every 6hrs as needed.  Mucinex Twice daily  As needed  Cough/congestion  Continue with Incentive spirometry Twice daily  .  Follow up with Dr. Vaughan Browner in 6-8 weeks with PFT and As needed   .

## 2021-01-10 ENCOUNTER — Other Ambulatory Visit: Payer: Self-pay | Admitting: Adult Health

## 2021-01-10 ENCOUNTER — Ambulatory Visit (INDEPENDENT_AMBULATORY_CARE_PROVIDER_SITE_OTHER): Payer: Medicare Other | Admitting: Nurse Practitioner

## 2021-01-10 VITALS — HR 89 | Temp 97.9°F | Resp 18

## 2021-01-10 DIAGNOSIS — Z8616 Personal history of COVID-19: Secondary | ICD-10-CM

## 2021-01-10 DIAGNOSIS — E785 Hyperlipidemia, unspecified: Secondary | ICD-10-CM | POA: Diagnosis not present

## 2021-01-10 DIAGNOSIS — C9110 Chronic lymphocytic leukemia of B-cell type not having achieved remission: Secondary | ICD-10-CM

## 2021-01-10 NOTE — Progress Notes (Signed)
@Patient  ID: [name] [name], female    DOB: 07/27/1946, 75 y.o.   MRN: [id number]  Chief Complaint  Patient presents with  . Follow-up    Referring provider: Deland Pretty, MD     HPI  Patient presents today for post-COVID care clinic visit.  This is a follow-up visit.  Since her last visit here on 10/29/2020 patient has been seen by pulmonary and did have HRCT completed which did not show pulmonary fibrosis.  Patient is trying to stay active.  She is walking daily.  She continues to do home PT exercises.  She denies any recent falls.  She states that she is much improved.  No new issues or concerns today.  She is scheduled for a repeat visit with pulmonary with PFT in June. Denies f/c/s, n/v/d, hemoptysis, PND, chest pain or edema.       Allergies  Allergen Reactions  . Lasix [Furosemide] Nausea And Vomiting and Other (See Comments)    HEADACHE  . Lithium Other (See Comments)    Dizziness, "cause me to fall"  . Sulfa Antibiotics Hives  . Trazodone And Nefazodone Other (See Comments)    insomnia  . Lyrica [Pregabalin] Diarrhea, Nausea Only and Rash    Immunization History  Administered Date(s) Administered  . Influenza, High Dose Seasonal PF 07/08/2016  . Influenza, Quadrivalent, Recombinant, Inj, Pf 06/30/2019  . Influenza,inj,Quad PF,6+ Mos 04/27/2014  . Influenza,inj,quad, With Preservative 05/25/2018  . Moderna SARS-COV2 Booster Vaccination 07/08/2020  . PFIZER(Purple Top)SARS-COV-2 Vaccination 09/13/2019, 10/04/2019  . Pneumococcal Conjugate-13 06/25/2017  . Pneumococcal-Unspecified 07/25/2018  . Tdap 11/23/2015  . Zoster 07/08/2014  . Zoster Recombinat (Shingrix) 09/01/2018, 03/17/2019    Past Medical History:  Diagnosis Date  . Alopecia   . Anxiety   . Arthritis    "spine" (07/28/2013)  . Breast cancer ([place]) 1997; 2014   right  . CAP (community acquired pneumonia)    admission 06-04-2013, failed outpatient  . Chronic back pain    "lower back and upper  neck" (07/28/2013)  . CLL (chronic lymphocytic leukemia) ([place]) 05/2013  . DDD (degenerative disc disease)   . 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 ear   . Depression   . Diverticulosis   . Elevated LFTs   . Fibromyalgia 1978  . GERD (gastroesophageal reflux disease)   . Hematuria   . History of syncope    episode 2008--  no recurrence since  . HLD (hyperlipidemia)   . Hypothyroidism   . Kidney stones 2014  . Plantar fasciitis   . Ruptured disk    one in neck and two in back  . S/P chemotherapy, time since greater than 12 weeks   . Sinus tachycardia    mild resting  . Skin cancer   . Stool incontinence    "at times recently"     Tobacco History: Social History   Tobacco Use  Smoking Status Never Smoker  Smokeless Tobacco Never Used   Counseling given: Yes   Outpatient Encounter Medications as of 01/10/2021  Medication Sig  . albuterol (VENTOLIN HFA) 108 (90 Base) MCG/ACT inhaler Inhale 1-2 puffs into the lungs every 6 (six) hours as needed for wheezing or shortness of breath.  . allopurinol (ZYLOPRIM) 300 MG tablet TAKE 1 TABLET(300 MG) BY MOUTH DAILY  . amitriptyline (ELAVIL) 50 MG tablet TAKE 1 TABLET(50 MG) BY MOUTH AT BEDTIME  . brexpiprazole (REXULTI) 2 MG TABS tablet 1 tablet  . buPROPion (WELLBUTRIN XL) 300 MG 24 hr tablet Take 300 mg by mouth daily.  Marland Kitchen  DULoxetine (CYMBALTA) 60 MG capsule Take 60 mg by mouth daily.  Marland Kitchen levothyroxine (SYNTHROID, LEVOTHROID) 88 MCG tablet Take 88 mcg by mouth daily before breakfast.  . metoprolol tartrate (LOPRESSOR) 100 MG tablet Take 1 tablet (100 mg) by mouth once 2 hours prior to procedure  . omeprazole (PRILOSEC) 40 MG capsule TAKE 1 CAPSULE(40 MG) BY MOUTH DAILY AFTER AND BREAKFAST  . rosuvastatin (CRESTOR) 5 MG tablet TAKE 1 TABLET(5 MG) BY MOUTH DAILY  . traMADol (ULTRAM) 50 MG tablet Take 1 tablet (50 mg total) by mouth every 6 (six) hours as needed.   No facility-administered encounter medications on file as of 01/10/2021.     Review of  Systems  Review of Systems  Constitutional: Negative.  Negative for fatigue.  HENT: Negative.   Respiratory: Negative for cough and shortness of breath.   Cardiovascular: Negative.   Gastrointestinal: Negative.   Allergic/Immunologic: Negative.   Neurological: Negative.   Psychiatric/Behavioral: Negative.        Physical Exam  Pulse 89   Temp 97.9 F (36.6 C)   Resp 18   LMP 08/26/1995   Wt Readings from Last 5 Encounters:  01/07/21 191 lb 3.2 oz (86.7 kg)  11/30/20 188 lb (85.3 kg)  10/24/20 190 lb 3.2 oz (86.3 kg)  09/19/20 199 lb (90.3 kg)  06/12/20 199 lb (90.3 kg)     Physical Exam Vitals and nursing note reviewed.  Constitutional:      General: She is not in acute distress.    Appearance: She is well-developed.  Cardiovascular:     Rate and Rhythm: Normal rate and regular rhythm.  Pulmonary:     Effort: Pulmonary effort is normal.     Breath sounds: Normal breath sounds.  Musculoskeletal:     Right lower leg: No edema.     Left lower leg: No edema.  Neurological:     Mental Status: She is alert and oriented to person, place, and time.  Psychiatric:        Mood and Affect: Mood normal.        Behavior: Behavior normal.      Imaging: CT Chest High Resolution  Result Date: 12/31/2020 CLINICAL DATA:  Pulmonary fibrosis, post COVID 08/2020, persistent shortness of breath, coughing, history of right breast cancer, CLL EXAM: CT CHEST WITHOUT CONTRAST TECHNIQUE: Multidetector CT imaging of the chest was performed following the standard protocol without intravenous contrast. High resolution imaging of the lungs, as well as inspiratory and expiratory imaging, was performed. COMPARISON:  CT chest, 09/19/2020 FINDINGS: Cardiovascular: Aortic atherosclerosis. Normal heart size. Left coronary artery calcifications. No pericardial effusion. Mediastinum/Nodes: Redemonstrated prominent left axillary and subpectoral lymph nodes, slightly diminished in size compared to  prior examination. Index node measures 1.4 x 1.0 cm, previously 2.0 x 1.5 cm (series 2, image 29). Small hiatal hernia. Thyroid gland, trachea, and esophagus demonstrate no significant findings. Lungs/Pleura: Mild, tubular bronchiectasis throughout. No evidence of fibrotic interstitial lung disease. Mild, lobular air trapping on expiratory phase imaging. No pleural effusion or pneumothorax. Upper Abdomen: No acute abnormality. Redemonstrated enlarged gastrohepatic ligament and portacaval lymph nodes, index portacaval node measuring 3.3 x 1.5 cm, previously 3.4 x 1.6 cm (series 2, image 145). Musculoskeletal: No chest wall mass or suspicious bone lesions identified. Status post right mastectomy. IMPRESSION: 1. Mild, tubular bronchiectasis throughout. No evidence of fibrotic interstitial lung disease. 2. Mild, lobular air trapping on expiratory phase imaging, suggestive of small airways disease. 3. Redemonstrated prominent left axillary and subpectoral lymph nodes, slightly  diminished in size compared to prior examination. Redemonstrated enlarged gastrohepatic ligament and portacaval lymph nodes, unchanged compared to prior. Findings are in keeping with reported diagnosis of CLL. 4. Coronary artery disease. Aortic Atherosclerosis (ICD10-I70.0). Electronically Signed   By: Eddie Candle M.D.   On: 12/31/2020 15:48     Assessment & Plan:   History of COVID-19 Stay well hydrated  Stay active  Deep breathing exercises  May take tylenol for fever or pain  Continue to follow with pulmonary   Follow up:  Follow up if needed     Fenton Foy, NP 01/10/2021

## 2021-01-10 NOTE — Patient Instructions (Addendum)
Covid 19:   Stay well hydrated  Stay active  Deep breathing exercises  May take tylenol for fever or pain  Continue to follow with pulmonary   Follow up:  Follow up if needed

## 2021-01-10 NOTE — Assessment & Plan Note (Signed)
Stay well hydrated  Stay active  Deep breathing exercises  May take tylenol for fever or pain  Continue to follow with pulmonary   Follow up:  Follow up if needed

## 2021-01-24 ENCOUNTER — Other Ambulatory Visit: Payer: Self-pay | Admitting: Oncology

## 2021-01-24 DIAGNOSIS — C9110 Chronic lymphocytic leukemia of B-cell type not having achieved remission: Secondary | ICD-10-CM

## 2021-01-24 DIAGNOSIS — C50011 Malignant neoplasm of nipple and areola, right female breast: Secondary | ICD-10-CM

## 2021-01-29 ENCOUNTER — Ambulatory Visit: Payer: Medicare Other

## 2021-02-06 DIAGNOSIS — F3341 Major depressive disorder, recurrent, in partial remission: Secondary | ICD-10-CM | POA: Diagnosis not present

## 2021-02-06 DIAGNOSIS — F411 Generalized anxiety disorder: Secondary | ICD-10-CM | POA: Diagnosis not present

## 2021-02-12 ENCOUNTER — Inpatient Hospital Stay: Payer: Medicare Other

## 2021-02-12 ENCOUNTER — Telehealth: Payer: Self-pay | Admitting: Oncology

## 2021-02-12 ENCOUNTER — Inpatient Hospital Stay: Payer: Medicare Other | Attending: Oncology | Admitting: Oncology

## 2021-02-12 ENCOUNTER — Other Ambulatory Visit: Payer: Self-pay

## 2021-02-12 VITALS — BP 115/82 | HR 100 | Temp 98.0°F | Resp 19 | Ht 63.9 in | Wt 195.8 lb

## 2021-02-12 DIAGNOSIS — Z85828 Personal history of other malignant neoplasm of skin: Secondary | ICD-10-CM | POA: Insufficient documentation

## 2021-02-12 DIAGNOSIS — Z808 Family history of malignant neoplasm of other organs or systems: Secondary | ICD-10-CM | POA: Insufficient documentation

## 2021-02-12 DIAGNOSIS — Z882 Allergy status to sulfonamides status: Secondary | ICD-10-CM | POA: Insufficient documentation

## 2021-02-12 DIAGNOSIS — Z905 Acquired absence of kidney: Secondary | ICD-10-CM | POA: Diagnosis not present

## 2021-02-12 DIAGNOSIS — Z818 Family history of other mental and behavioral disorders: Secondary | ICD-10-CM | POA: Insufficient documentation

## 2021-02-12 DIAGNOSIS — Z9221 Personal history of antineoplastic chemotherapy: Secondary | ICD-10-CM | POA: Insufficient documentation

## 2021-02-12 DIAGNOSIS — Z823 Family history of stroke: Secondary | ICD-10-CM | POA: Diagnosis not present

## 2021-02-12 DIAGNOSIS — Z170 Estrogen receptor positive status [ER+]: Secondary | ICD-10-CM

## 2021-02-12 DIAGNOSIS — Z800 Family history of malignant neoplasm of digestive organs: Secondary | ICD-10-CM | POA: Diagnosis not present

## 2021-02-12 DIAGNOSIS — D649 Anemia, unspecified: Secondary | ICD-10-CM | POA: Insufficient documentation

## 2021-02-12 DIAGNOSIS — Z8262 Family history of osteoporosis: Secondary | ICD-10-CM | POA: Diagnosis not present

## 2021-02-12 DIAGNOSIS — Z79899 Other long term (current) drug therapy: Secondary | ICD-10-CM | POA: Diagnosis not present

## 2021-02-12 DIAGNOSIS — Z9049 Acquired absence of other specified parts of digestive tract: Secondary | ICD-10-CM | POA: Insufficient documentation

## 2021-02-12 DIAGNOSIS — Z888 Allergy status to other drugs, medicaments and biological substances status: Secondary | ICD-10-CM | POA: Insufficient documentation

## 2021-02-12 DIAGNOSIS — R0602 Shortness of breath: Secondary | ICD-10-CM | POA: Diagnosis not present

## 2021-02-12 DIAGNOSIS — C9110 Chronic lymphocytic leukemia of B-cell type not having achieved remission: Secondary | ICD-10-CM | POA: Insufficient documentation

## 2021-02-12 DIAGNOSIS — C50511 Malignant neoplasm of lower-outer quadrant of right female breast: Secondary | ICD-10-CM

## 2021-02-12 DIAGNOSIS — Z833 Family history of diabetes mellitus: Secondary | ICD-10-CM | POA: Diagnosis not present

## 2021-02-12 DIAGNOSIS — Z8249 Family history of ischemic heart disease and other diseases of the circulatory system: Secondary | ICD-10-CM | POA: Insufficient documentation

## 2021-02-12 DIAGNOSIS — Z9011 Acquired absence of right breast and nipple: Secondary | ICD-10-CM | POA: Diagnosis not present

## 2021-02-12 DIAGNOSIS — R599 Enlarged lymph nodes, unspecified: Secondary | ICD-10-CM | POA: Insufficient documentation

## 2021-02-12 DIAGNOSIS — Z90722 Acquired absence of ovaries, bilateral: Secondary | ICD-10-CM | POA: Insufficient documentation

## 2021-02-12 LAB — CBC WITH DIFFERENTIAL/PLATELET
Abs Immature Granulocytes: 0.04 10*3/uL (ref 0.00–0.07)
Basophils Absolute: 0.1 10*3/uL (ref 0.0–0.1)
Basophils Relative: 1 %
Eosinophils Absolute: 0.6 10*3/uL — ABNORMAL HIGH (ref 0.0–0.5)
Eosinophils Relative: 5 %
HCT: 34.8 % — ABNORMAL LOW (ref 36.0–46.0)
Hemoglobin: 11.2 g/dL — ABNORMAL LOW (ref 12.0–15.0)
Immature Granulocytes: 0 %
Lymphocytes Relative: 43 %
Lymphs Abs: 4.7 10*3/uL — ABNORMAL HIGH (ref 0.7–4.0)
MCH: 27.6 pg (ref 26.0–34.0)
MCHC: 32.2 g/dL (ref 30.0–36.0)
MCV: 85.7 fL (ref 80.0–100.0)
Monocytes Absolute: 1.6 10*3/uL — ABNORMAL HIGH (ref 0.1–1.0)
Monocytes Relative: 15 %
Neutro Abs: 4 10*3/uL (ref 1.7–7.7)
Neutrophils Relative %: 36 %
Platelets: 185 10*3/uL (ref 150–400)
RBC: 4.06 MIL/uL (ref 3.87–5.11)
RDW: 14.3 % (ref 11.5–15.5)
WBC: 11 10*3/uL — ABNORMAL HIGH (ref 4.0–10.5)
nRBC: 0 % (ref 0.0–0.2)

## 2021-02-12 LAB — TYPE AND SCREEN
ABO/RH(D): O POS
Antibody Screen: NEGATIVE

## 2021-02-12 LAB — LACTATE DEHYDROGENASE: LDH: 220 U/L — ABNORMAL HIGH (ref 98–192)

## 2021-02-12 NOTE — Progress Notes (Signed)
ID: [name] OB: Jun 06, 1946  MR#: [id number]  IWL#:[id number]  Patient Care Team: Deland Pretty, MD as PCP - General (Internal Medicine) Jerline Pain, MD as PCP - Cardiology (Cardiology) Yoshino Broccoli, Virgie Dad, MD as Consulting Physician (Oncology) Milus Banister, MD as Attending Physician (Gastroenterology) Megan Salon, MD as Consulting Physician (Gynecology) Donnie Mesa, MD as Consulting Physician (General Surgery) Noemi Chapel, NP as Nurse Practitioner Rod Can, MD as Consulting Physician (Orthopedic Surgery) Latanya Maudlin, MD as Consulting Physician (Orthopedic Surgery) Ria Clock, MD as Attending Physician (Radiology) Adele Dan, MD as Referring Physician (Orthopedic Surgery)   CHIEF COMPLAINT: 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  She is now under observation.  Since her last visit, she underwent high resolution chest CT on 12/31/2020 to evaluate her post-Covid symptoms. This showed: mild, tubular bronchiectasis throughout, no evidence of fibrotic interstitial lung disease; mild, lobular air trapping on expiratory phase imaging, suggestive of small airways disease; redemonstrated prominent left axillary and subpectoral lymph nodes, slightly diminished in size compared to prior examination; redemonstrated enlarged gastrohepatic ligament and portacaval lymph nodes, unchanged compared to prior.   REVIEW OF SYSTEMS: Telecia denies fever, drenching sweats, unexplained fatigue or unexplained weight loss she does her normal activities like cleaning the house and doing some cooking and she takes walks sometimes but she becomes easily short of breath.  She is followed closely for pulmonary problems by Dr. Vaughan Browner.  Her  husband is very busy with the animals and planting in the farm--he was recently attacked by one of her spools--but she is really not engaged in that type of work.  A detailed review of systems today was otherwise noncontributory   COVID 19 VACCINATION STATUS: [place] x2, most recently 09/2019; infection 09/19/2020; received evusheld March 2022   BREAST CANCER HISTORY: From the prior summary:  Tritia has a history of breast cancer dating back to 1997, [name] performed a right lumpectomy and axillary lymph node dissection for what according to the patient and her family was a stage I invasive ductal breast cancer. She received radiation adjuvantly and then took tamoxifen for 5 years  In February of 2014 she had a normal screening mammography, but in November 2014 she felt a "hard lump" in her right breast. She brought this to the attention of her urologist, Dr. Izora Gala, and he set her up for bilateral diagnostic mammography with mammography at [facility] 07/13/2013. This showed her breast density to be category B. There was no mammographic abnormality noted but ultrasonography showed a 1.4 cm irregularly shaped solid mass in the right breast at the 8:00 position. This was biopsied the same day, and showed ([id number]) an invasive ductal carcinoma, grade 2, estrogen receptor 100% positive, progesterone receptor 13% positive, with an MIB-1 of 33% and HER-2 amplification with a HER-2: CEP 17 ratio of 3.19, and a HER-2 copy number percent all of 4.15.  The patient's subsequent history is as detailed below     PAST MEDICAL HISTORY: Past Medical History:  Diagnosis Date   Alopecia    Anxiety    Arthritis    "spine" (07/28/2013)   Breast cancer ([place]) 1997; 2014   right   CAP (community acquired pneumonia)    admission 06-04-2013, failed outpatient   Chronic back pain    "lower  back and upper neck" (07/28/2013)   CLL (chronic lymphocytic leukemia) ([place]) 05/2013   DDD (degenerative disc  disease)    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 ear    Depression    Diverticulosis    Elevated LFTs    Fibromyalgia 1978   GERD (gastroesophageal reflux disease)    Hematuria    History of syncope    episode 2008--  no recurrence since   HLD (hyperlipidemia)    Hypothyroidism    Kidney stones 2014   Plantar fasciitis    Ruptured disk    one in neck and two in back   S/P chemotherapy, time since greater than 12 weeks    Sinus tachycardia    mild resting   Skin cancer    Stool incontinence    "at times recently"     PAST SURGICAL HISTORY: Past Surgical History:  Procedure Laterality Date   ANTERIOR LATERAL LUMBAR FUSION 4 LEVELS Left 04/25/2016   Procedure: LEFT LUMBAR ONE-TWO, LUMBAR TWO-THREE, LUMBAR THREE-FOUR, LUMBAR FOUR-FIVE ANTERIOR LATERAL LUMBAR FUSION;  Surgeon: Earnie Larsson, MD;  Location: [place] [facility];  Service: Neurosurgery;  Laterality: Left;   APPENDECTOMY  8315   APPLICATION OF ROBOTIC ASSISTANCE FOR SPINAL PROCEDURE N/A 04/06/2017   Procedure: APPLICATION OF ROBOTIC ASSISTANCE FOR SPINAL PROCEDURE;  Surgeon: Earnie Larsson, MD;  Location: [place];  Service: Neurosurgery;  Laterality: N/A;   BREAST BIOPSY Right 2014   BREAST LUMPECTOMY Right 1997   BREAST LUMPECTOMY WITH AXILLARY LYMPH NODE DISSECTION Right 1997   CATARACT EXTRACTION, BILATERAL  2019   Dr. Kathlen Mody   CYSTOSCOPY WITH RETROGRADE PYELOGRAM, URETEROSCOPY AND STENT PLACEMENT Bilateral 06/17/2013   Procedure: CYSTOSCOPY WITH RETROGRADE PYELOGRAM, URETEROSCOPY AND LEFT DOUBLE  J STENT PLACEMENT RIGHT URETERAL HOLMIIUM LASER AND DOUBLE J STENT ;  Surgeon: Hanley Ben, MD;  Location: [place];  Service: Urology;  Laterality: Bilateral;   HOLMIUM LASER APPLICATION Bilateral 17/61/6073   Procedure: HOLMIUM LASER APPLICATION;  Surgeon: Hanley Ben, MD;  Location: [place];  Service: Urology;  Laterality: Bilateral;   LITHOTRIPSY Left 06/2013   LUMBAR EPIDURAL INJECTION     has had 7  injections   PORT-A-CATH REMOVAL Left 10/03/2014   Procedure: REMOVAL PORT-A-CATH;  Surgeon: Donnie Mesa, MD;  Location: [place];  Service: General;  Laterality: Left;   PORTACATH PLACEMENT Left 07/28/2013   Procedure: ATTEMPTED INSERTION PORT-A-CATH;  Surgeon: Imogene Burn. Georgette Dover, MD;  Location: [facility];  Service: General;  Laterality: Left;   POSTERIOR LUMBAR FUSION 4 LEVEL N/A 04/25/2016   Procedure: LUMBAR FIVE-SACRAL ONE POSTERIOR LUMBAR INTERBODY FUSION, THORACIC NINE-SACRAL ONE POSTERIOR LATERAL ARTHRODESIS WITH PEDICLE SCREWS;  Surgeon: Earnie Larsson, MD;  Location: [place] [facility];  Service: Neurosurgery;  Laterality: N/A;   RETINAL DETACHMENT SURGERY Right 2013   ROBOTIC ASSITED PARTIAL NEPHRECTOMY Right 02/05/2015   Procedure: ROBOTIC ASSITED PARTIAL NEPHRECTOMY;  Surgeon: Raynelle Bring, MD;  Location[facility];  Service: Urology;  Laterality: Right;   SIMPLE MASTECTOMY WITH AXILLARY SENTINEL NODE BIOPSY Right 07/28/2013   Procedure: RIGHT TOTAL  MASTECTOMY;  Surgeon: Imogene Burn. Georgette Dover, MD;  Location: [place];  Service: General;  Laterality: Right;   TONSILLECTOMY  AGE 79   TOTAL ABDOMINAL HYSTERECTOMY W/ BILATERAL SALPINGOOPHORECTOMY  1997    FAMILY HISTORY Family History  Problem Relation Age of Onset   Heart disease Maternal Grandfather    Diabetes Maternal Grandfather    Colon cancer Maternal Aunt 46   Brain cancer Paternal Grandmother  dx in 22s   Dementia Mother    Diabetes Mother    Osteoporosis Mother    Diabetes Maternal Aunt    Prostate cancer Father 30   Bipolar disorder Maternal Aunt    Stroke Maternal Aunt    Stomach cancer Paternal Uncle        dx in late 46s   the patient's mother died at the age of 27. The patient's father is alive at age 32. She had no brothers or sisters. Her father has a history of prostate cancer. There is no history of breast or ovarian cancer in the family. One maternal first cousin has a history of Hodgkin's  lymphoma.   GYNECOLOGIC HISTORY:  Menarche age 8, first live birth age 25. She is GX P1. She underwent total abdominal hysterectomy and bilateral salpingo-oophorectomy in 1997 she did not take hormone replacement.   SOCIAL HISTORY: (Updated  11/15/2013)  She is a homemaker. Her husband Arnette Norris farms approximately 500 acres, quite aside from keeping cows goats and other animals.Daughter Colletta [place] lives in [place] [address] where she works as a Pension scheme manager for a drug company. The patient has no grandchildren. She has two "grand cats". She attends a local united church of [place]: Not in place   HEALTH MAINTENANCE:  Social History   Tobacco Use   Smoking status: Never   Smokeless tobacco: Never  Vaping Use   Vaping Use: Never used  Substance Use Topics   Alcohol use: No   Drug use: No     Colonoscopy: 2009/ [facility]  PAP: Status post hysterectomy  Bone density: May 10/14/2009 at [facility] was normal  Lipid panel:  Not on file   Allergies  Allergen Reactions   Lasix [Furosemide] Nausea And Vomiting and Other (See Comments)    HEADACHE   Lithium Other (See Comments)    Dizziness, "cause me to fall"   Sulfa Antibiotics Hives   Trazodone And Nefazodone Other (See Comments)    insomnia   Lyrica [Pregabalin] Diarrhea, Nausea Only and Rash    Current Outpatient Medications  Medication Sig Dispense Refill   albuterol (VENTOLIN HFA) 108 (90 Base) MCG/ACT inhaler Inhale 1-2 puffs into the lungs every 6 (six) hours as needed for wheezing or shortness of breath. 8 g 2   allopurinol (ZYLOPRIM) 300 MG tablet TAKE 1 TABLET(300 MG) BY MOUTH DAILY 90 tablet 4   amitriptyline (ELAVIL) 50 MG tablet TAKE 1 TABLET(50 MG) BY MOUTH AT BEDTIME 30 tablet 1   brexpiprazole (REXULTI) 2 MG TABS tablet 1 tablet     buPROPion (WELLBUTRIN XL) 300 MG 24 hr tablet Take 300 mg by mouth daily.     DULoxetine (CYMBALTA) 60 MG capsule Take 60 mg by mouth daily.      levothyroxine (SYNTHROID, LEVOTHROID) 88 MCG tablet Take 88 mcg by mouth daily before breakfast.  4   metoprolol tartrate (LOPRESSOR) 100 MG tablet Take 1 tablet (100 mg) by mouth once 2 hours prior to procedure 1 tablet 0   omeprazole (PRILOSEC) 40 MG capsule TAKE 1 CAPSULE(40 MG) BY MOUTH DAILY AFTER BREAKFAST 90 capsule 0   rosuvastatin (CRESTOR) 5 MG tablet TAKE 1 TABLET(5 MG) BY MOUTH DAILY 90 tablet 3   traMADol (ULTRAM) 50 MG tablet Take 1 tablet (50 mg total) by mouth every 6 (six) hours as needed. 10 tablet 0   No current facility-administered medications for this visit.    OBJECTIVE: white woman who appears stated age  32:  02/12/21 1521  BP: 115/82  Pulse: 100  Resp: 19  Temp: 98 F (36.7 C)  SpO2: 98%   Wt Readings from Last 3 Encounters:  02/12/21 195 lb 12.8 oz (88.8 kg)  01/07/21 191 lb 3.2 oz (86.7 kg)  11/30/20 188 lb (85.3 kg)   Body mass index is 33.71 kg/m.    ECOG FS:1 - Symptomatic but completely ambulatory  Sclerae unicteric, EOMs intact Wearing a mask No cervical or supraclavicular adenopathy Lungs no rales or rhonchi Heart regular rate and rhythm Abd soft, nontender, positive bowel sounds MSK no focal spinal tenderness, no upper extremity lymphedema Neuro: nonfocal, well oriented, appropriate affect Breasts: Status post right mastectomy.  There is no evidence of local recurrence.  Left breast and both axillae are benign.   LAB RESULTS:  Lab Results  Component Value Date   WBC 11.0 (H) 02/12/2021   NEUTROABS 4.0 02/12/2021   HGB 11.2 (L) 02/12/2021   HCT 34.8 (L) 02/12/2021   MCV 85.7 02/12/2021   PLT 185 02/12/2021      Chemistry      Component Value Date/Time   NA 136 10/10/2020 1559   NA 140 03/25/2017 1300   K 3.9 10/10/2020 1559   K 4.2 03/25/2017 1300   CL 98 10/10/2020 1559   CO2 18 (L) 10/10/2020 1559   CO2 24 03/25/2017 1300   BUN 14 10/10/2020 1559   BUN 17.9 03/25/2017 1300   CREATININE 1.52 (H) 10/10/2020 1559    CREATININE 1.03 (H) 05/11/2020 1034   CREATININE 1.1 03/25/2017 1300      Component Value Date/Time   CALCIUM 9.1 10/10/2020 1559   CALCIUM 9.4 03/25/2017 1300   ALKPHOS 184 (H) 10/10/2020 1559   ALKPHOS 102 03/25/2017 1300   AST 40 10/10/2020 1559   AST 21 05/11/2020 1034   AST 41 (H) 03/25/2017 1300   ALT 40 (H) 10/10/2020 1559   ALT 23 05/11/2020 1034   ALT 52 03/25/2017 1300   BILITOT 0.5 10/10/2020 1559   BILITOT 0.3 05/11/2020 1034   BILITOT 0.36 03/25/2017 1300      STUDIES: No results found.   ASSESSMENT: 75 y.o. BRCA negative [place] woman  (1) status post right breast lumpectomy and axillary lymph node dissection in 1997 for a stage I breast cancer, treated with adjuvant radiation and tamoxifen for 5 years  (2) status post right breast lower outer quadrant biopsy 07/13/2013 for a clinical T1c N0, stage IA invasive ductal carcinoma, grade 2, estrogen receptor 100% positive, progesterone receptor 13% positive, with an MIB-1 of 33%, and HER-2 amplification by CISH with a HER2/CEP 17 ratio of 3.19, and an average HER-2 copy number per cell of 4.15  (3) status post right mastectomy 07/28/2013 for a pT1c pN0, stage IA invasive ductal carcinoma, grade 3, with close but negative margins. Prognostic panel was not repeated  (a) the patient met with Dr. Harlow Mares and has decided against reconstruction  (4) completed weekly paclitaxel x12 12/06/2913, with trastuzumab/ pertuzumab every 3 weeks; pertuzumab was held with third dose on 11/01/2013 due to diarrhea, tried a half dose on cycle 4 again with diarrhea developing  (5) trastuzumab (started 09/20/2013) continued for 1 year, last dose 09/21/2014;  (a) final echocardiogram 09/07/2014 showed an ejection fraction of 55-60%  (6) anastrozole started May 2015; completed April 2020  (a) bone density 12/15/2013 normal  (b) bone density 02/01/2016 at [facility] was normal with a T score of -1.0   OTHER PROBLEMS: (a) History of  chronic  lymphoid leukemia diagnosed by flow cytometry 06/29/2013, the cells being CD5, CD20 and CD23 positive, CD10 negative.   (1) right renal mass resected on 02/05/15, consisting of atypical lymphoid proliferation composed of monotonous small lymphocytes   (2) Anemia with a normal MCV and normal ferritin-- B-12 and folate normal; stable  (3)  ibrutinib 280 milligrams daily started 08/01/202020, discontinued 06/03/2019 because of concerns regarding dosing  (4) rituximab weekly x8 started 06/15/2019 completed December 2020  (5) maintenance rituximab (Q 2 months) started 10/25/2019, continued through 08/03/2020   PLAN: Jossalyn is 7 and half years out from definitive surgery for her breast cancer with no evidence of disease recurrence.  This is very favorable.  As far as her breast cancer go she continues under observation and she will have her mammogram at [facility] in September of this year  We do not have indications to resume treatment of her chronic lymphoid leukemia.  The lymph nodes on the recent CT scan were stable to improved.  The anemia which she has which is very mild likely is not related.  She does not have any "B" symptoms.  Accordingly we will continue observation there as well and she will return to see me in October for labs and physical exam only  She is unlikely to make antibodies to her vaccines and she will receive a second dose of Evusheld when she returns to see me in October  She knows to call for any other issue that may develop before that visit  Total encounter time 25 minutes.Sarajane Jews C. Patricie Geeslin, MD  02/12/21 3:52 PM Medical Oncology and Hematology [facility] [place], [place] [address] Tel. [phone]    Fax. [phone]   I, Wilburn Mylar, am acting as scribe for Dr. Virgie Dad. Karo Rog.  I, Lurline Del MD, have reviewed the above documentation for accuracy and completeness, and I agree with the above.   *Total Encounter  Time as defined by the Centers for Medicare and Medicaid Services includes, in addition to the face-to-face time of a patient visit (documented in the note above) non-face-to-face time: obtaining and reviewing outside history, ordering and reviewing medications, tests or procedures, care coordination (communications with other health care professionals or caregivers) and documentation in the medical record.

## 2021-02-12 NOTE — Telephone Encounter (Signed)
Scheduled appointment per 06/21 los. Patient is aware. 

## 2021-02-13 LAB — BETA 2 MICROGLOBULIN, SERUM: Beta-2 Microglobulin: 4 mg/L — ABNORMAL HIGH (ref 0.6–2.4)

## 2021-02-14 DIAGNOSIS — R7989 Other specified abnormal findings of blood chemistry: Secondary | ICD-10-CM | POA: Diagnosis not present

## 2021-02-14 DIAGNOSIS — C9110 Chronic lymphocytic leukemia of B-cell type not having achieved remission: Secondary | ICD-10-CM | POA: Diagnosis not present

## 2021-02-14 DIAGNOSIS — E039 Hypothyroidism, unspecified: Secondary | ICD-10-CM | POA: Diagnosis not present

## 2021-02-19 DIAGNOSIS — Z0000 Encounter for general adult medical examination without abnormal findings: Secondary | ICD-10-CM | POA: Diagnosis not present

## 2021-02-19 DIAGNOSIS — I4891 Unspecified atrial fibrillation: Secondary | ICD-10-CM | POA: Diagnosis not present

## 2021-02-19 DIAGNOSIS — R059 Cough, unspecified: Secondary | ICD-10-CM | POA: Diagnosis not present

## 2021-02-19 DIAGNOSIS — K219 Gastro-esophageal reflux disease without esophagitis: Secondary | ICD-10-CM | POA: Diagnosis not present

## 2021-03-11 ENCOUNTER — Other Ambulatory Visit: Payer: Self-pay | Admitting: Adult Health

## 2021-03-11 DIAGNOSIS — C9110 Chronic lymphocytic leukemia of B-cell type not having achieved remission: Secondary | ICD-10-CM

## 2021-03-14 DIAGNOSIS — Z6833 Body mass index (BMI) 33.0-33.9, adult: Secondary | ICD-10-CM | POA: Diagnosis not present

## 2021-03-14 DIAGNOSIS — E039 Hypothyroidism, unspecified: Secondary | ICD-10-CM | POA: Diagnosis not present

## 2021-03-15 ENCOUNTER — Encounter: Payer: Self-pay | Admitting: Oncology

## 2021-03-15 ENCOUNTER — Other Ambulatory Visit (HOSPITAL_COMMUNITY)
Admission: RE | Admit: 2021-03-15 | Discharge: 2021-03-15 | Disposition: A | Discharge status: Home / Self Care | Payer: Medicare Other | Source: Ambulatory Visit | Attending: Pulmonary Disease | Admitting: Pulmonary Disease

## 2021-03-15 DIAGNOSIS — Z20822 Contact with and (suspected) exposure to covid-19: Secondary | ICD-10-CM | POA: Insufficient documentation

## 2021-03-15 DIAGNOSIS — Z01812 Encounter for preprocedural laboratory examination: Secondary | ICD-10-CM | POA: Diagnosis not present

## 2021-03-15 LAB — SARS CORONAVIRUS 2 (TAT 6-24 HRS): SARS Coronavirus 2: NEGATIVE

## 2021-03-18 ENCOUNTER — Ambulatory Visit (INDEPENDENT_AMBULATORY_CARE_PROVIDER_SITE_OTHER): Payer: Medicare Other | Admitting: Pulmonary Disease

## 2021-03-18 ENCOUNTER — Other Ambulatory Visit: Payer: Self-pay

## 2021-03-18 ENCOUNTER — Ambulatory Visit: Payer: Medicare Other | Admitting: Pulmonary Disease

## 2021-03-18 ENCOUNTER — Encounter: Payer: Self-pay | Admitting: Pulmonary Disease

## 2021-03-18 VITALS — BP 130/74 | HR 96 | Temp 99.2°F | Ht 63.9 in | Wt 193.0 lb

## 2021-03-18 DIAGNOSIS — J1282 Pneumonia due to coronavirus disease 2019: Secondary | ICD-10-CM | POA: Diagnosis not present

## 2021-03-18 DIAGNOSIS — U071 COVID-19: Secondary | ICD-10-CM

## 2021-03-18 DIAGNOSIS — J479 Bronchiectasis, uncomplicated: Secondary | ICD-10-CM

## 2021-03-18 DIAGNOSIS — Z7409 Other reduced mobility: Secondary | ICD-10-CM

## 2021-03-18 DIAGNOSIS — U099 Post covid-19 condition, unspecified: Secondary | ICD-10-CM | POA: Diagnosis not present

## 2021-03-18 LAB — PULMONARY FUNCTION TEST
DL/VA % pred: 115 %
DL/VA: 4.74 ml/min/mmHg/L
DLCO cor % pred: 114 %
DLCO cor: 21.76 ml/min/mmHg
DLCO unc % pred: 105 %
DLCO unc: 20.13 ml/min/mmHg
FEF 25-75 Post: 3.43 L/sec
FEF 25-75 Pre: 3.96 L/sec
FEF2575-%Change-Post: -13 %
FEF2575-%Pred-Post: 207 %
FEF2575-%Pred-Pre: 239 %
FEV1-%Change-Post: -3 %
FEV1-%Pred-Post: 122 %
FEV1-%Pred-Pre: 126 %
FEV1-Post: 2.57 L
FEV1-Pre: 2.65 L
FEV1FVC-%Change-Post: 1 %
FEV1FVC-%Pred-Pre: 117 %
FEV6-%Change-Post: -4 %
FEV6-%Pred-Post: 108 %
FEV6-%Pred-Pre: 113 %
FEV6-Post: 2.88 L
FEV6-Pre: 3.01 L
FEV6FVC-%Pred-Post: 105 %
FEV6FVC-%Pred-Pre: 105 %
FVC-%Change-Post: -4 %
FVC-%Pred-Post: 102 %
FVC-%Pred-Pre: 107 %
FVC-Post: 2.88 L
FVC-Pre: 3.01 L
Post FEV1/FVC ratio: 90 %
Post FEV6/FVC ratio: 100 %
Pre FEV1/FVC ratio: 88 %
Pre FEV6/FVC Ratio: 100 %
RV % pred: 101 %
RV: 2.31 L
TLC % pred: 105 %
TLC: 5.34 L

## 2021-03-18 NOTE — Patient Instructions (Signed)
I have reviewed your lung function test which shows normal lung function which is good news Work on weight loss and exercise which will help with your breathing  Return to clinic as needed

## 2021-03-18 NOTE — Progress Notes (Signed)
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  No mold, hot tub, Jacuzzi Smoking history: Never smoker Travel history: No significant travel history Relevant family history: No family issue of lung disease.  Interim history: She is here for review of CT and PFTs next States that breathing is overall improved but continues to have dyspnea on exertion   Outpatient Encounter Medications as of 03/18/2021  Medication Sig   albuterol (VENTOLIN HFA) 108 (90 Base) MCG/ACT inhaler Inhale 1-2 puffs into the lungs every 6 (six) hours as needed for wheezing or shortness of breath.   allopurinol (ZYLOPRIM) 300 MG tablet TAKE 1 TABLET(300 MG) BY MOUTH DAILY   amitriptyline (ELAVIL) 50 MG tablet TAKE 1 TABLET(50 MG) BY MOUTH AT BEDTIME   brexpiprazole (REXULTI) 2 MG TABS tablet 1 tablet   buPROPion (WELLBUTRIN XL) 300 MG 24 hr tablet Take 300 mg by mouth daily.   DULoxetine (CYMBALTA) 60 MG capsule Take 60 mg by mouth daily.   levothyroxine (SYNTHROID, LEVOTHROID) 88 MCG tablet Take 88 mcg by mouth daily before breakfast.   metoprolol tartrate (LOPRESSOR) 100 MG tablet Take 1 tablet (100 mg) by mouth once 2 hours prior to procedure   omeprazole (PRILOSEC) 40 MG capsule TAKE 1 CAPSULE(40 MG) BY MOUTH DAILY AFTER BREAKFAST   rosuvastatin (CRESTOR) 5 MG tablet TAKE 1  TABLET(5 MG) BY MOUTH DAILY   traMADol (ULTRAM) 50 MG tablet Take 1 tablet (50 mg total) by mouth every 6 (six) hours as needed.   No facility-administered encounter medications on file as of 03/18/2021.    Physical Exam: Blood pressure 112/64, pulse (!) 105, temperature (!) 97 F (36.1 C), temperature source Temporal, height '5\' 5"'$  (1.651 m), weight 188 lb (85.3 kg), last menstrual period 08/26/1995, SpO2 98 %. Gen:      No acute distress HEENT:  EOMI, sclera anicteric Neck:     No masses; no thyromegaly Lungs:    Clear to auscultation bilaterally; normal respiratory effort CV:         Regular rate and rhythm; no murmurs Abd:      + bowel sounds; soft, non-tender; no palpable masses, no distension Ext:    No edema; adequate peripheral perfusion Skin:      Warm and dry; no rash Neuro: alert and oriented x 3 Psych: normal mood and affect  Data Reviewed: Imaging: Chest x-ray 10/10/2020-faint hazy upper lung opacities.  High-resolution CT 12/31/2020-mild tubular bronchiectasis, mild air trapping, axillary and subpectoral lymph node.  No interstitial lung disease I have reviewed the images personally.  PFTs: 03/18/2021 FVC 2.88 (102%), FEV1 2.57 [122%], F/F 90, TLC 4.34 [105%], DLCO 20.13 [105%] Normal test  Labs:  Assessment:  Post COVID-19 She is recovered well back to baseline CT with no ILD.  She has minimal bronchiectasis which looks nonspecific.  PFTs are normal  Suspect dyspnea on exertion secondary  to deconditioning and body habitus Advised weight loss with diet and exercise  Follow-up as needed  Plan/Recommendations: Diet, exercise Follow-up in pulmonary as needed  Marshell Garfinkel MD [facility] 03/18/2021, 12:18 PM  CC: Deland Pretty, MD

## 2021-03-18 NOTE — Progress Notes (Signed)
Full PFT performed today. °

## 2021-03-18 NOTE — Patient Instructions (Signed)
Full PFT performed today. °

## 2021-04-10 DIAGNOSIS — R3915 Urgency of urination: Secondary | ICD-10-CM | POA: Diagnosis not present

## 2021-04-10 DIAGNOSIS — R351 Nocturia: Secondary | ICD-10-CM | POA: Diagnosis not present

## 2021-04-10 DIAGNOSIS — N3941 Urge incontinence: Secondary | ICD-10-CM | POA: Diagnosis not present

## 2021-04-10 DIAGNOSIS — R350 Frequency of micturition: Secondary | ICD-10-CM | POA: Diagnosis not present

## 2021-04-24 ENCOUNTER — Other Ambulatory Visit: Payer: Self-pay | Admitting: Oncology

## 2021-04-24 DIAGNOSIS — C50011 Malignant neoplasm of nipple and areola, right female breast: Secondary | ICD-10-CM

## 2021-04-24 DIAGNOSIS — C9110 Chronic lymphocytic leukemia of B-cell type not having achieved remission: Secondary | ICD-10-CM

## 2021-05-02 DIAGNOSIS — M48062 Spinal stenosis, lumbar region with neurogenic claudication: Secondary | ICD-10-CM | POA: Diagnosis not present

## 2021-05-30 ENCOUNTER — Encounter: Payer: Self-pay | Admitting: Oncology

## 2021-05-30 DIAGNOSIS — Z1231 Encounter for screening mammogram for malignant neoplasm of breast: Secondary | ICD-10-CM | POA: Diagnosis not present

## 2021-05-30 DIAGNOSIS — M8589 Other specified disorders of bone density and structure, multiple sites: Secondary | ICD-10-CM | POA: Diagnosis not present

## 2021-06-04 ENCOUNTER — Inpatient Hospital Stay: Payer: Medicare Other

## 2021-06-04 ENCOUNTER — Inpatient Hospital Stay (HOSPITAL_BASED_OUTPATIENT_CLINIC_OR_DEPARTMENT_OTHER): Payer: Medicare Other | Admitting: Oncology

## 2021-06-04 ENCOUNTER — Other Ambulatory Visit: Payer: Self-pay

## 2021-06-04 ENCOUNTER — Inpatient Hospital Stay: Payer: Medicare Other | Attending: Oncology

## 2021-06-04 VITALS — BP 149/75 | HR 96 | Temp 97.6°F | Resp 18 | Ht 63.0 in | Wt 201.1 lb

## 2021-06-04 DIAGNOSIS — Z8249 Family history of ischemic heart disease and other diseases of the circulatory system: Secondary | ICD-10-CM | POA: Diagnosis not present

## 2021-06-04 DIAGNOSIS — Z808 Family history of malignant neoplasm of other organs or systems: Secondary | ICD-10-CM | POA: Insufficient documentation

## 2021-06-04 DIAGNOSIS — Z90722 Acquired absence of ovaries, bilateral: Secondary | ICD-10-CM | POA: Diagnosis not present

## 2021-06-04 DIAGNOSIS — Z79899 Other long term (current) drug therapy: Secondary | ICD-10-CM | POA: Diagnosis not present

## 2021-06-04 DIAGNOSIS — Z170 Estrogen receptor positive status [ER+]: Secondary | ICD-10-CM

## 2021-06-04 DIAGNOSIS — D649 Anemia, unspecified: Secondary | ICD-10-CM | POA: Insufficient documentation

## 2021-06-04 DIAGNOSIS — C50511 Malignant neoplasm of lower-outer quadrant of right female breast: Secondary | ICD-10-CM | POA: Insufficient documentation

## 2021-06-04 DIAGNOSIS — Z823 Family history of stroke: Secondary | ICD-10-CM | POA: Diagnosis not present

## 2021-06-04 DIAGNOSIS — Z905 Acquired absence of kidney: Secondary | ICD-10-CM | POA: Insufficient documentation

## 2021-06-04 DIAGNOSIS — Z800 Family history of malignant neoplasm of digestive organs: Secondary | ICD-10-CM | POA: Insufficient documentation

## 2021-06-04 DIAGNOSIS — Z818 Family history of other mental and behavioral disorders: Secondary | ICD-10-CM | POA: Insufficient documentation

## 2021-06-04 DIAGNOSIS — C9110 Chronic lymphocytic leukemia of B-cell type not having achieved remission: Secondary | ICD-10-CM | POA: Insufficient documentation

## 2021-06-04 DIAGNOSIS — Z882 Allergy status to sulfonamides status: Secondary | ICD-10-CM | POA: Insufficient documentation

## 2021-06-04 DIAGNOSIS — Z888 Allergy status to other drugs, medicaments and biological substances status: Secondary | ICD-10-CM | POA: Diagnosis not present

## 2021-06-04 DIAGNOSIS — Z9221 Personal history of antineoplastic chemotherapy: Secondary | ICD-10-CM | POA: Insufficient documentation

## 2021-06-04 DIAGNOSIS — Z9049 Acquired absence of other specified parts of digestive tract: Secondary | ICD-10-CM | POA: Insufficient documentation

## 2021-06-04 DIAGNOSIS — Z9011 Acquired absence of right breast and nipple: Secondary | ICD-10-CM | POA: Diagnosis not present

## 2021-06-04 DIAGNOSIS — Z8262 Family history of osteoporosis: Secondary | ICD-10-CM | POA: Insufficient documentation

## 2021-06-04 DIAGNOSIS — Z833 Family history of diabetes mellitus: Secondary | ICD-10-CM | POA: Diagnosis not present

## 2021-06-04 DIAGNOSIS — Z85828 Personal history of other malignant neoplasm of skin: Secondary | ICD-10-CM | POA: Diagnosis not present

## 2021-06-04 LAB — CBC WITH DIFFERENTIAL/PLATELET
Abs Immature Granulocytes: 0.04 10*3/uL (ref 0.00–0.07)
Basophils Absolute: 0.1 10*3/uL (ref 0.0–0.1)
Basophils Relative: 1 %
Eosinophils Absolute: 0.7 10*3/uL — ABNORMAL HIGH (ref 0.0–0.5)
Eosinophils Relative: 4 %
HCT: 35 % — ABNORMAL LOW (ref 36.0–46.0)
Hemoglobin: 11.1 g/dL — ABNORMAL LOW (ref 12.0–15.0)
Immature Granulocytes: 0 %
Lymphocytes Relative: 54 %
Lymphs Abs: 8.8 10*3/uL — ABNORMAL HIGH (ref 0.7–4.0)
MCH: 27.1 pg (ref 26.0–34.0)
MCHC: 31.7 g/dL (ref 30.0–36.0)
MCV: 85.4 fL (ref 80.0–100.0)
Monocytes Absolute: 2.1 10*3/uL — ABNORMAL HIGH (ref 0.1–1.0)
Monocytes Relative: 13 %
Neutro Abs: 4.4 10*3/uL (ref 1.7–7.7)
Neutrophils Relative %: 28 %
Platelets: 201 10*3/uL (ref 150–400)
RBC: 4.1 MIL/uL (ref 3.87–5.11)
RDW: 14.8 % (ref 11.5–15.5)
WBC: 16 10*3/uL — ABNORMAL HIGH (ref 4.0–10.5)
nRBC: 0 % (ref 0.0–0.2)

## 2021-06-04 LAB — LACTATE DEHYDROGENASE: LDH: 275 U/L — ABNORMAL HIGH (ref 98–192)

## 2021-06-04 MED ORDER — METHYLPREDNISOLONE SODIUM SUCC 125 MG IJ SOLR: 125.0000 mg | Freq: Once | INTRAMUSCULAR | Status: DC | PRN

## 2021-06-04 MED ORDER — TIXAGEVIMAB (PART OF EVUSHELD) INJECTION
300.0000 mg | Freq: Once | INTRAMUSCULAR | Status: AC
  Administered 2021-06-04: 300 mg via INTRAMUSCULAR
  Filled 2021-06-04: qty 3

## 2021-06-04 MED ORDER — EPINEPHRINE 0.3 MG/0.3ML IJ SOAJ: 0.3000 mg | Freq: Once | INTRAMUSCULAR | Status: DC | PRN

## 2021-06-04 MED ORDER — ALBUTEROL SULFATE HFA 108 (90 BASE) MCG/ACT IN AERS: 2.0000 | INHALATION_SPRAY | Freq: Once | RESPIRATORY_TRACT | Status: DC | PRN

## 2021-06-04 MED ORDER — CILGAVIMAB (PART OF EVUSHELD) INJECTION
300.0000 mg | Freq: Once | INTRAMUSCULAR | Status: AC
  Administered 2021-06-04: 300 mg via INTRAMUSCULAR
  Filled 2021-06-04: qty 3

## 2021-06-04 MED ORDER — DIPHENHYDRAMINE HCL 50 MG/ML IJ SOLN: 50.0000 mg | Freq: Once | INTRAMUSCULAR | Status: DC | PRN

## 2021-06-04 NOTE — Progress Notes (Signed)
ID: [name] [name] OB: 11-18-1945  MR#: [id number]  CSN#:[id number]  Patient Care Team: Deland Pretty, MD as PCP - General (Internal Medicine) Jerline Pain, MD as PCP - Cardiology (Cardiology) Natalija Mavis, Virgie Dad, MD as Consulting Physician (Oncology) Milus Banister, MD as Attending Physician (Gastroenterology) Megan Salon, MD as Consulting Physician (Gynecology) Donnie Mesa, MD as Consulting Physician (General Surgery) Noemi Chapel, NP as Nurse Practitioner Rod Can, MD as Consulting Physician (Orthopedic Surgery) Latanya Maudlin, MD as Consulting Physician (Orthopedic Surgery) Ria Clock, MD as Attending Physician (Radiology) Adele Dan, MD as Referring Physician (Orthopedic Surgery)   CHIEF COMPLAINT: 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  She is now under observation.  Mamm[facility] at [facility] 05/30/2021 shows the breast density to be category capital B.  There was no evidence of malignancy.  She is scheduled for repeat mammography 06/05/2022   REVIEW OF SYSTEMS: [name] [name] has significant pain in her neck and lower back.  She has had surgery there under Dr. Trenton Gammon.  She says she saw them again and he feels that what she has is really arthritis and he she says did not offer any particular treatment.  She manages to get her housework done but it is a struggle.  She has not lost any significant weight.  She does have hot flashes but not fevers or drenching sweats.  She is not aware of any adenopathy.  A detailed review of systems today was otherwise stable   COVID 19 VACCINATION STATUS: Pfizer x3; infection 09/19/2020; received evusheld March 2022 and October 2022   BREAST CANCER HISTORY: From the prior summary:  [name] [name] has a history  of breast cancer dating back to 1997, [name] [name] Dr. Annabell Sabal performed a right lumpectomy and axillary lymph node dissection for what according to the patient and her family was a stage I invasive ductal breast cancer. She received radiation adjuvantly and then took tamoxifen for 5 years  In February of 2014 she had a normal screening mammography, but in November 2014 she felt a "hard lump" in her right breast. She brought this to the attention of her urologist, Dr. Izora Gala, and he set her up for bilateral diagnostic mammography with mammography at [facility] 07/13/2013. This showed her breast density to be category B. There was no mammographic abnormality noted but ultrasonography showed a 1.4 cm irregularly shaped solid mass in the right breast at the 8:00 position. This was biopsied the same day, and showed ([id number]) an invasive ductal carcinoma, grade 2, estrogen receptor 100% positive, progesterone receptor 13% positive, with an MIB-1 of 33% and HER-2 amplification with a HER-2: CEP 17 ratio of 3.19, and a HER-2 copy number percent all of 4.15.  The patient's subsequent history is as detailed below     PAST MEDICAL HISTORY: Past Medical History:  Diagnosis Date   Alopecia    Anxiety    Arthritis    "spine" (07/28/2013)   Breast cancer ([name]) 1997; 2014   right   CAP (community acquired pneumonia)    admission 06-04-2013, failed outpatient   Chronic back pain    "lower back and upper neck" (07/28/2013)   CLL (chronic lymphocytic leukemia) ([place]) 05/2013   DDD (degenerative disc disease)    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 ear    Depression    Diverticulosis  Elevated LFTs    Fibromyalgia 1978   GERD (gastroesophageal reflux disease)    Hematuria    History of syncope    episode 2008--  no recurrence since   HLD (hyperlipidemia)    Hypothyroidism    Kidney stones 2014   Plantar fasciitis    Ruptured disk    one in neck and two in back   S/P chemotherapy, time since greater than 12 weeks     Sinus tachycardia    mild resting   Skin cancer    Stool incontinence    "at times recently"     PAST SURGICAL HISTORY: Past Surgical History:  Procedure Laterality Date   ANTERIOR LATERAL LUMBAR FUSION 4 LEVELS Left 04/25/2016   Procedure: LEFT LUMBAR ONE-TWO, LUMBAR TWO-THREE, LUMBAR THREE-FOUR, LUMBAR FOUR-FIVE ANTERIOR LATERAL LUMBAR FUSION;  Surgeon: Earnie Larsson, MD;  Location: [place] [facility];  Service: Neurosurgery;  Laterality: Left;   APPENDECTOMY  [id number]   APPLICATION OF ROBOTIC ASSISTANCE FOR SPINAL PROCEDURE N/A 04/06/2017   Procedure: APPLICATION OF ROBOTIC ASSISTANCE FOR SPINAL PROCEDURE;  Surgeon: Earnie Larsson, MD;  Location: [place];  Service: Neurosurgery;  Laterality: N/A;   BREAST BIOPSY Right 2014   BREAST LUMPECTOMY Right 1997   BREAST LUMPECTOMY WITH AXILLARY LYMPH NODE DISSECTION Right 1997   CATARACT EXTRACTION, BILATERAL  2019   Dr. Kathlen Mody   CYSTOSCOPY WITH RETROGRADE PYELOGRAM, URETEROSCOPY AND STENT PLACEMENT Bilateral 06/17/2013   Procedure: CYSTOSCOPY WITH RETROGRADE PYELOGRAM, URETEROSCOPY AND LEFT DOUBLE  J STENT PLACEMENT RIGHT URETERAL HOLMIIUM LASER AND DOUBLE J STENT ;  Surgeon: Hanley Ben, MD;  Location: [place];  Service: Urology;  Laterality: Bilateral;   HOLMIUM LASER APPLICATION Bilateral 51/70/0174   Procedure: HOLMIUM LASER APPLICATION;  Surgeon: Hanley Ben, MD;  Location: [place];  Service: Urology;  Laterality: Bilateral;   LITHOTRIPSY Left 06/2013   LUMBAR EPIDURAL INJECTION     has had 7 injections   PORT-A-CATH REMOVAL Left 10/03/2014   Procedure: REMOVAL PORT-A-CATH;  Surgeon: Donnie Mesa, MD;  Location: [place];  Service: General;  Laterality: Left;   PORTACATH PLACEMENT Left 07/28/2013   Procedure: ATTEMPTED INSERTION PORT-A-CATH;  Surgeon: Imogene Burn. Georgette Dover, MD;  Location: [place];  Service: General;  Laterality: Left;   POSTERIOR LUMBAR FUSION 4 LEVEL N/A 04/25/2016   Procedure: LUMBAR  FIVE-SACRAL ONE POSTERIOR LUMBAR INTERBODY FUSION, THORACIC NINE-SACRAL ONE POSTERIOR LATERAL ARTHRODESIS WITH PEDICLE SCREWS;  Surgeon: Earnie Larsson, MD;  Location: [place] [facility];  Service: Neurosurgery;  Laterality: N/A;   RETINAL DETACHMENT SURGERY Right 2013   ROBOTIC ASSITED PARTIAL NEPHRECTOMY Right 02/05/2015   Procedure: ROBOTIC ASSITED PARTIAL NEPHRECTOMY;  Surgeon: Raynelle Bring, MD;  Location: [facility];  Service: Urology;  Laterality: Right;   SIMPLE MASTECTOMY WITH AXILLARY SENTINEL NODE BIOPSY Right 07/28/2013   Procedure: RIGHT TOTAL  MASTECTOMY;  Surgeon: Imogene Burn. Georgette Dover, MD;  Location: [facility];  Service: General;  Laterality: Right;   TONSILLECTOMY  AGE 75   TOTAL ABDOMINAL HYSTERECTOMY W/ BILATERAL SALPINGOOPHORECTOMY  1997    FAMILY HISTORY Family History  Problem Relation Age of Onset   Heart disease Maternal Grandfather    Diabetes Maternal Grandfather    Colon cancer Maternal Aunt 59   Brain cancer Paternal Grandmother        dx in 47s   Dementia Mother    Diabetes Mother    Osteoporosis Mother    Diabetes Maternal Aunt    Prostate cancer Father 93   Bipolar  disorder Maternal Aunt    Stroke Maternal Aunt    Stomach cancer Paternal Uncle        dx in late 76s   the patient's mother died at the age of 50. The patient's father is alive at age 46. She had no brothers or sisters. Her father has a history of prostate cancer. There is no history of breast or ovarian cancer in the family. One maternal first cousin has a history of Hodgkin's lymphoma.   GYNECOLOGIC HISTORY:  Menarche age 73, first live birth age 38. She is GX P1. She underwent total abdominal hysterectomy and bilateral salpingo-oophorectomy in 1997 she did not take hormone replacement.   SOCIAL HISTORY: (Updated October 2022) She is a Agricultural engineer. Her husband Arnette Norris farms approximately 500 acres, quite aside from keeping cows goats and other animals.Daughter Colletta [place] lives in [place] and has worked as a  Pension scheme manager for a drug company, but is currently unemployed. The patient has no grandchildren. She has two "grand cats". She attends a local united church of [place]: Not in place   HEALTH MAINTENANCE:  Social History   Tobacco Use   Smoking status: Never   Smokeless tobacco: Never  Vaping Use   Vaping Use: Never used  Substance Use Topics   Alcohol use: No   Drug use: No     Colonoscopy: 2009/ [facility]  PAP: Status post hysterectomy  Bone density: May 10/14/2009 at [facility] was normal  Lipid panel:  Not on file   Allergies  Allergen Reactions   Lasix [Furosemide] Nausea And Vomiting and Other (See Comments)    HEADACHE   Lithium Other (See Comments)    Dizziness, "cause me to fall"   Sulfa Antibiotics Hives   Trazodone And Nefazodone Other (See Comments)    insomnia   Lyrica [Pregabalin] Diarrhea, Nausea Only and Rash    Current Outpatient Medications  Medication Sig Dispense Refill   albuterol (VENTOLIN HFA) 108 (90 Base) MCG/ACT inhaler Inhale 1-2 puffs into the lungs every 6 (six) hours as needed for wheezing or shortness of breath. 8 g 2   allopurinol (ZYLOPRIM) 300 MG tablet TAKE 1 TABLET(300 MG) BY MOUTH DAILY 90 tablet 4   amitriptyline (ELAVIL) 50 MG tablet TAKE 1 TABLET(50 MG) BY MOUTH AT BEDTIME 30 tablet 1   brexpiprazole (REXULTI) 2 MG TABS tablet 1 tablet     buPROPion (WELLBUTRIN XL) 300 MG 24 hr tablet Take 300 mg by mouth daily.     DULoxetine (CYMBALTA) 60 MG capsule Take 60 mg by mouth daily.     levothyroxine (SYNTHROID, LEVOTHROID) 88 MCG tablet Take 88 mcg by mouth daily before breakfast.  4   metoprolol tartrate (LOPRESSOR) 100 MG tablet Take 1 tablet (100 mg) by mouth once 2 hours prior to procedure 1 tablet 0   omeprazole (PRILOSEC) 40 MG capsule TAKE 1 CAPSULE(40 MG) BY MOUTH DAILY AFTER AND BREAKFAST 90 capsule 0   rosuvastatin (CRESTOR) 5 MG tablet TAKE 1 TABLET(5 MG) BY MOUTH DAILY 90 tablet 3   traMADol  (ULTRAM) 50 MG tablet Take 1 tablet (50 mg total) by mouth every 6 (six) hours as needed. 10 tablet 0   Current Facility-Administered Medications  Medication Dose Route Frequency Provider Last Rate Last Admin   albuterol (VENTOLIN HFA) 108 (90 Base) MCG/ACT inhaler 2 puff  2 puff Inhalation Once PRN Nylan Nevel, Virgie Dad, MD       cilgavimab (part of EVUSHELD) injection SOLN 300 mg  300 mg Intramuscular Once Artyom Stencel, Virgie Dad, MD       diphenhydrAMINE (BENADRYL) injection 50 mg  50 mg Intramuscular Once PRN Kaylianna Detert, Virgie Dad, MD       EPINEPHrine (EPI-PEN) injection 0.3 mg  0.3 mg Intramuscular Once PRN Brelyn Woehl, Virgie Dad, MD       methylPREDNISolone sodium succinate (SOLU-MEDROL) 125 mg/2 mL injection 125 mg  125 mg Intramuscular Once PRN Dannis Deroche, Virgie Dad, MD       tixagevimab (part of EVUSHELD) injection SOLN 300 mg  300 mg Intramuscular Once Tifini Reeder, Virgie Dad, MD        OBJECTIVE: white woman who appears stated age  57:   06/04/21 1519  BP: (!) 149/75  Pulse: 96  Resp: 18  Temp: 97.6 F (36.4 C)  SpO2: 100%    Wt Readings from Last 3 Encounters:  06/04/21 201 lb 1.6 oz (91.2 kg)  03/18/21 193 lb (87.5 kg)  02/12/21 195 lb 12.8 oz (88.8 kg)   Body mass index is 35.62 kg/m.    ECOG FS:1 - Symptomatic but completely ambulatory  Sclerae unicteric, EOMs intact Wearing a mask No cervical or supraclavicular adenopathy, no axillary or inguinal adenopathy Lungs no rales or rhonchi Heart regular rate and rhythm Abd soft, nontender, positive bowel sounds MSK no focal spinal tenderness, no upper extremity lymphedema Neuro: nonfocal, well oriented, appropriate affect Breasts: Right breast is status post mastectomy.  There is no evidence of local recurrence.  The left breast and both axillae are benign   LAB RESULTS:  Lab Results  Component Value Date   WBC 16.0 (H) 06/04/2021   NEUTROABS 4.4 06/04/2021   HGB 11.1 (L) 06/04/2021   HCT 35.0 (L) 06/04/2021   MCV 85.4  06/04/2021   PLT 201 06/04/2021      Chemistry      Component Value Date/Time   NA 136 10/10/2020 1559   NA 140 03/25/2017 1300   K 3.9 10/10/2020 1559   K 4.2 03/25/2017 1300   CL 98 10/10/2020 1559   CO2 18 (L) 10/10/2020 1559   CO2 24 03/25/2017 1300   BUN 14 10/10/2020 1559   BUN 17.9 03/25/2017 1300   CREATININE 1.52 (H) 10/10/2020 1559   CREATININE 1.03 (H) 05/11/2020 1034   CREATININE 1.1 03/25/2017 1300      Component Value Date/Time   CALCIUM 9.1 10/10/2020 1559   CALCIUM 9.4 03/25/2017 1300   ALKPHOS 184 (H) 10/10/2020 1559   ALKPHOS 102 03/25/2017 1300   AST 40 10/10/2020 1559   AST 21 05/11/2020 1034   AST 41 (H) 03/25/2017 1300   ALT 40 (H) 10/10/2020 1559   ALT 23 05/11/2020 1034   ALT 52 03/25/2017 1300   BILITOT 0.5 10/10/2020 1559   BILITOT 0.3 05/11/2020 1034   BILITOT 0.36 03/25/2017 1300      STUDIES: No results found.   ASSESSMENT: 75 y.o. BRCA negative [place] woman  (1) status post right breast lumpectomy and axillary lymph node dissection in 1997 for a stage I breast cancer, treated with adjuvant radiation and tamoxifen for 5 years  (2) status post right breast lower outer quadrant biopsy 07/13/2013 for a clinical T1c N0, stage IA invasive ductal carcinoma, grade 2, estrogen receptor 100% positive, progesterone receptor 13% positive, with an MIB-1 of 33%, and HER-2 amplification by CISH with a HER2/CEP 17 ratio of 3.19, and an average HER-2 copy number per cell of 4.15  (3) status post right mastectomy 07/28/2013 for a pT1c pN0,  stage IA invasive ductal carcinoma, grade 3, with close but negative margins. Prognostic panel was not repeated  (a) the patient met with Dr. Harlow Mares and has decided against reconstruction  (4) completed weekly paclitaxel x12 12/06/2913, with trastuzumab/ pertuzumab every 3 weeks; pertuzumab was held with third dose on 11/01/2013 due to diarrhea, tried a half dose on cycle 4 again with diarrhea developing  (5)  trastuzumab (started 09/20/2013) continued for 1 year, last dose 09/21/2014;  (a) final echocardiogram 09/07/2014 showed an ejection fraction of 55-60%  (6) anastrozole started May 2015; completed April 2020  (a) bone density 12/15/2013 normal  (b) bone density 02/01/2016 at [place] was normal with a T score of -1.0   OTHER PROBLEMS: (a) History of chronic lymphoid leukemia diagnosed by flow cytometry 06/29/2013, the cells being CD5, CD20 and CD23 positive, CD10 negative.   (1) right renal mass resected on 02/05/15, consisting of atypical lymphoid proliferation composed of monotonous small lymphocytes   (2) Anemia with a normal MCV and normal ferritin-- B-12 and folate normal; stable  (3)  ibrutinib 280 milligrams daily started 08/01/202020, discontinued 06/03/2019 because of concerns regarding dosing  (4) rituximab weekly x8 started 06/15/2019 completed December 2020  (5) maintenance rituximab (Q 2 months) started 10/25/2019, continued through 08/03/2020   PLAN: Ozell is coming up on 8 years from definitive surgery for her breast cancer with no evidence of disease recurrence.  This is very favorable.  Her chronic lymphoid leukemia is very stable.  There has been a slow increase in her absolute lymphocyte count.  The hemoglobin remains unchanged.  There are no "B" symptoms.  While we could start treatment not sure it would make much difference to her functional status, if any, so I would prefer to wait and continue observation for now.  I do think she will benefit from evusheld.  She tolerated the first dose March 0321 without complications and she will receive a second dose today.  I am setting her up for a third dose 6 months from now at her next visit.  Of course we will repeat her lab work at that time  She knows to call for any other issues that may develop before then.  Total encounter time 25 minutes.Sarajane Jews C. Hudson Majkowski, MD  06/04/21 3:56 PM Medical Oncology and Hematology [facility] [place], [place] [address] Tel. [phone]    Fax. [phone]   I, Wilburn Mylar, am acting as scribe for Dr. Virgie Dad. [name] Cagley.  I, Lurline Del MD, have reviewed the above documentation for accuracy and completeness, and I agree with the above.   *Total Encounter Time as defined by the Centers for Medicare and Medicaid Services includes, in addition to the face-to-face time of a patient visit (documented in the note above) non-face-to-face time: obtaining and reviewing outside history, ordering and reviewing medications, tests or procedures, care coordination (communications with other health care professionals or caregivers) and documentation in the medical record.

## 2021-06-04 NOTE — Progress Notes (Signed)
Evusheld given as ordered.  30 min observation.  Pt tolerated well without any s/s of a reaction.  Pt left ambulatory.

## 2021-06-05 LAB — BETA 2 MICROGLOBULIN, SERUM: Beta-2 Microglobulin: 2.7 mg/L — ABNORMAL HIGH (ref 0.6–2.4)

## 2021-06-07 ENCOUNTER — Encounter (HOSPITAL_BASED_OUTPATIENT_CLINIC_OR_DEPARTMENT_OTHER): Payer: Self-pay | Admitting: Obstetrics & Gynecology

## 2021-06-11 DIAGNOSIS — E039 Hypothyroidism, unspecified: Secondary | ICD-10-CM | POA: Diagnosis not present

## 2021-06-23 ENCOUNTER — Other Ambulatory Visit: Payer: Self-pay | Admitting: Cardiology

## 2021-08-03 ENCOUNTER — Other Ambulatory Visit: Payer: Self-pay | Admitting: Adult Health

## 2021-08-03 DIAGNOSIS — C9110 Chronic lymphocytic leukemia of B-cell type not having achieved remission: Secondary | ICD-10-CM

## 2021-08-04 ENCOUNTER — Encounter: Payer: Self-pay | Admitting: Oncology

## 2021-08-04 ENCOUNTER — Other Ambulatory Visit: Payer: Self-pay | Admitting: Cardiology

## 2021-09-03 ENCOUNTER — Other Ambulatory Visit: Payer: Self-pay | Admitting: Oncology

## 2021-09-06 DIAGNOSIS — M545 Low back pain, unspecified: Secondary | ICD-10-CM | POA: Diagnosis not present

## 2021-09-06 DIAGNOSIS — R300 Dysuria: Secondary | ICD-10-CM | POA: Diagnosis not present

## 2021-09-17 ENCOUNTER — Encounter: Payer: Self-pay | Admitting: Physical Medicine and Rehabilitation

## 2021-09-18 ENCOUNTER — Other Ambulatory Visit (INDEPENDENT_AMBULATORY_CARE_PROVIDER_SITE_OTHER): Payer: Medicare Other

## 2021-09-18 ENCOUNTER — Encounter: Payer: Self-pay | Admitting: Nurse Practitioner

## 2021-09-18 ENCOUNTER — Ambulatory Visit: Payer: Medicare Other | Admitting: Nurse Practitioner

## 2021-09-18 ENCOUNTER — Other Ambulatory Visit: Payer: Self-pay | Admitting: Adult Health

## 2021-09-18 VITALS — BP 118/68 | HR 104 | Ht 63.0 in | Wt 194.0 lb

## 2021-09-18 DIAGNOSIS — R159 Full incontinence of feces: Secondary | ICD-10-CM

## 2021-09-18 DIAGNOSIS — D649 Anemia, unspecified: Secondary | ICD-10-CM

## 2021-09-18 DIAGNOSIS — C9110 Chronic lymphocytic leukemia of B-cell type not having achieved remission: Secondary | ICD-10-CM

## 2021-09-18 LAB — CBC
HCT: 41.6 % (ref 36.0–46.0)
Hemoglobin: 13 g/dL (ref 12.0–15.0)
MCHC: 31.2 g/dL (ref 30.0–36.0)
MCV: 87.1 fl (ref 78.0–100.0)
Platelets: 275 10*3/uL (ref 150.0–400.0)
RBC: 4.77 Mil/uL (ref 3.87–5.11)
RDW: 15.7 % — ABNORMAL HIGH (ref 11.5–15.5)
WBC: 43.3 10*3/uL (ref 4.0–10.5)

## 2021-09-18 LAB — FERRITIN: Ferritin: 41.4 ng/mL (ref 10.0–291.0)

## 2021-09-18 NOTE — Progress Notes (Signed)
ASSESSMENT AND PLAN    # 76 yo female with black stool x 1 month, now resolved. No stool in vault on exam, gloved finger heme negative. Unclear if black stool represented bleeding --Stop Naprosyn / Naproxen until further notice --Await labs --Continue PPI --Call us ASAP if you have recurrent bleeding  # Chronic mild [place] anemia. Baseline hgb 10-12 range --Await cbc, ferritin, TIBC  # Intermittent fecal leakage, possibly due to internal hemorrhoids. She doesn't have sensation of incomplete rectal emptying. Resting sphincter tone mildly decreased.  --for the intermittent fecal incontinence please use Prep H suppositories or cream at night for 10 nights. If leakage persists then let us [place] and can refer to pelvic floor physical therapy  # GERD, asymptomatic on daily PPI  HISTORY OF PRESENT ILLNESS     Chief Complaint : black stool, now resolved  [name] [name] is a 76 y.o. female with a past medical history significant for chronic lymphoid leukemia,  remote breast cancer status post right lumpectomy / axillary lymph node dissection / radiation /  tamoxifen,  CKD, chronic diarrhea, diverticulosis, depression, fibromyalgia, GERD, hiatal hernia, Cameron's erosions, Schatzki's ring.  See PMH for additional medical history.   Patient was last seen here in 2018 by Dr. Ardis Hughs for evaluation of GERD, chronic diarrhea and elevated liver test.   Patient comes in with a history of black stool for 1 month which resolved a week ago.  Stools are now back to normal.  Other than the stool being black she may of had a slight increase in frequency of bowel movements but consistency of stool was unchanged.  With the black stool she had no associated nausea, vomiting nor abdominal pain.  She takes iron on a chronic basis but it has never discolored her stool.  She takes a daily baby aspirin which she has taken for years.  Additionally, for years she has also taken Naprosyn 4-5 times a week.  She is on daily  omeprazole for history of GERD.   [name] [name] mentions that she has been occasionally having a problem with fecal leakage.  She feels like she empties her rectum well during bowel movements. She has hemorrhoid problems in the past.   She has no urine leakage   Data Reviewed:   Latest Reference Range & Units 10/10/20 15:59 10/24/20 11:12 02/12/21 15:02 06/04/21 15:02  WBC 4.0 - 10.5 K/uL 13.3 (H) 8.8 11.0 (H) 16.0 (H)  RBC 3.87 - 5.11 MIL/uL 3.56 (L) 3.90 4.06 4.10  Hemoglobin 12.0 - 15.0 g/dL 10.2 (L) 10.8 (L) 11.2 (L) 11.1 (L)  HCT 36.0 - 46.0 % 30.0 (L) 35.1 (L) 34.8 (L) 35.0 (L)  MCV 80.0 - 100.0 fL 84 90.0 85.7 85.4  MCH 26.0 - 34.0 pg 28.7 27.7 27.6 27.1  MCHC 30.0 - 36.0 g/dL 34.0 30.8 32.2 31.7  (H): Data is abnormally high (L): Data is abnormally low   PREVIOUS GI EVALUATIONS:   March 2016 colonoscopy for diarrhea --normal  March 2016 EGD for GERD symptoms and dysphagia --There was a 3cm hiatal hernia containing typical appearing Cameron's erosions. There was a mucosal, Schatzki's ring at GE junction above the hiatal hernia which was dilated with CRE TTS balloon held inflated to 13mm for 1 minute. The examination was otherwise normal  Biopsies  Diagnosis Surgical [P], random sites - [place]; OTHERWISE HISTOLOGICALLY NORMAL COLONIC MUCOSA - NEGATIVE FOR MORPHOLOGIC FEATURES OF IDIOPATHIC INFLAMMATORY BOWEL DISEASE, MICROSCOPIC COLITIS OR INFECTIOUS COLITIS - NEGATIVE FOR  DYSPLASIA  Past Medical History:  Diagnosis Date   Alopecia    Anxiety    Arthritis    "spine" (07/28/2013)   Breast cancer ([place]) 1997; 2014   right   CAP (community acquired pneumonia)    admission 06-04-2013, failed outpatient   Chronic back pain    "lower back and upper neck" (07/28/2013)   CLL (chronic lymphocytic leukemia) ([place]) 05/2013   DDD (degenerative disc disease)    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 ear    Depression    Diverticulosis    Fibromyalgia 1978   GERD  (gastroesophageal reflux disease)    History of syncope    episode 2008--  no recurrence since   HLD (hyperlipidemia)    Hypothyroidism    Kidney stones 2014   Plantar fasciitis    Ruptured disk    one in neck and two in back   S/P chemotherapy, time since greater than 12 weeks    Sinus tachycardia    mild resting   Skin cancer    Stool incontinence    "at times recently"      Past Surgical History:  Procedure Laterality Date   ANTERIOR LATERAL LUMBAR FUSION 4 LEVELS Left 04/25/2016   Procedure: LEFT LUMBAR ONE-TWO, LUMBAR TWO-THREE, LUMBAR THREE-FOUR, LUMBAR FOUR-FIVE ANTERIOR LATERAL LUMBAR FUSION;  Surgeon: Earnie Larsson, MD;  Location: [facility];  Service: Neurosurgery;  Laterality: Left;   APPENDECTOMY  4315   APPLICATION OF ROBOTIC ASSISTANCE FOR SPINAL PROCEDURE N/A 04/06/2017   Procedure: APPLICATION OF ROBOTIC ASSISTANCE FOR SPINAL PROCEDURE;  Surgeon: Earnie Larsson, MD;  Location: [facility];  Service: Neurosurgery;  Laterality: N/A;   BREAST BIOPSY Right 2014   BREAST LUMPECTOMY Right 1997   BREAST LUMPECTOMY WITH AXILLARY LYMPH NODE DISSECTION Right 1997   CATARACT EXTRACTION, BILATERAL  2019   Dr. Kathlen Mody   CYSTOSCOPY WITH RETROGRADE PYELOGRAM, URETEROSCOPY AND STENT PLACEMENT Bilateral 06/17/2013   Procedure: CYSTOSCOPY WITH RETROGRADE PYELOGRAM, URETEROSCOPY AND LEFT DOUBLE  J STENT PLACEMENT RIGHT URETERAL HOLMIIUM LASER AND DOUBLE J STENT ;  Surgeon: Hanley Ben, MD;  Location: [facility];  Service: Urology;  Laterality: Bilateral;   HOLMIUM LASER APPLICATION Bilateral 40/03/6760   Procedure: HOLMIUM LASER APPLICATION;  Surgeon: Hanley Ben, MD;  Location: [facility];  Service: Urology;  Laterality: Bilateral;   LITHOTRIPSY Left 06/2013   LUMBAR EPIDURAL INJECTION     has had 7 injections   PORT-A-CATH REMOVAL Left 10/03/2014   Procedure: REMOVAL PORT-A-CATH;  Surgeon: Donnie Mesa, MD;  Location: [facility];  Service:  General;  Laterality: Left;   PORTACATH PLACEMENT Left 07/28/2013   Procedure: ATTEMPTED INSERTION PORT-A-CATH;  Surgeon: Imogene Burn. Georgette Dover, MD;  Location: [facility];  Service: General;  Laterality: Left;   POSTERIOR LUMBAR FUSION 4 LEVEL N/A 04/25/2016   Procedure: LUMBAR FIVE-SACRAL ONE POSTERIOR LUMBAR INTERBODY FUSION, THORACIC NINE-SACRAL ONE POSTERIOR LATERAL ARTHRODESIS WITH PEDICLE SCREWS;  Surgeon: Earnie Larsson, MD;  Location: [facility];  Service: Neurosurgery;  Laterality: N/A;   RETINAL DETACHMENT SURGERY Right 2013   ROBOTIC ASSITED PARTIAL NEPHRECTOMY Right 02/05/2015   Procedure: ROBOTIC ASSITED PARTIAL NEPHRECTOMY;  Surgeon: Raynelle Bring, MD;  Location: [facility];  Service: Urology;  Laterality: Right;   SIMPLE MASTECTOMY WITH AXILLARY SENTINEL NODE BIOPSY Right 07/28/2013   Procedure: RIGHT TOTAL  MASTECTOMY;  Surgeon: Imogene Burn. Georgette Dover, MD;  Location: [facility];  Service: General;  Laterality: Right;   TONSILLECTOMY  AGE 18   TOTAL ABDOMINAL HYSTERECTOMY W/ BILATERAL  SALPINGOOPHORECTOMY  1997   Family History  Problem Relation Age of Onset   Heart disease Maternal Grandfather    Diabetes Maternal Grandfather    Colon cancer Maternal Aunt 13   Brain cancer Paternal Grandmother        dx in 59s   Dementia Mother    Diabetes Mother    Osteoporosis Mother    Diabetes Maternal Aunt    Prostate cancer Father 14   Bipolar disorder Maternal Aunt    Stroke Maternal Aunt    Stomach cancer Paternal Uncle        dx in late 24s   Social History   Tobacco Use   Smoking status: Never   Smokeless tobacco: Never  Vaping Use   Vaping Use: Never used  Substance Use Topics   Alcohol use: No   Drug use: No   Current Outpatient Medications  Medication Sig Dispense Refill   albuterol (VENTOLIN HFA) 108 (90 Base) MCG/ACT inhaler Inhale 1-2 puffs into the lungs every 6 (six) hours as needed for wheezing or shortness of breath. 8 g 2   allopurinol (ZYLOPRIM) 300 MG tablet TAKE 1 TABLET(300 MG) BY  MOUTH DAILY 90 tablet 4   amitriptyline (ELAVIL) 50 MG tablet TAKE 1 TABLET(50 MG) BY MOUTH AT BEDTIME 30 tablet 1   brexpiprazole (REXULTI) 2 MG TABS tablet 1 tablet     buPROPion (WELLBUTRIN XL) 300 MG 24 hr tablet Take 300 mg by mouth daily. (Patient not taking: Reported on 06/04/2021)     DULoxetine (CYMBALTA) 60 MG capsule Take 60 mg by mouth daily.     levothyroxine (SYNTHROID, LEVOTHROID) 88 MCG tablet Take 88 mcg by mouth daily before breakfast.  4   metoprolol tartrate (LOPRESSOR) 100 MG tablet Take 1 tablet (100 mg) by mouth once 2 hours prior to procedure 1 tablet 0   omeprazole (PRILOSEC) 40 MG capsule TAKE 1 CAPSULE(40 MG) BY MOUTH DAILY AFTER AND BREAKFAST 90 capsule 0   rosuvastatin (CRESTOR) 5 MG tablet Take 1 tablet (5 mg total) by mouth daily. Please make overdue appt with Dr. Marlou Porch before anymore refills. Thank you 2nd attempt 15 tablet 0   traMADol (ULTRAM) 50 MG tablet Take 1 tablet (50 mg total) by mouth every 6 (six) hours as needed. 10 tablet 0   No current facility-administered medications for this visit.   Allergies  Allergen Reactions   Lasix [Furosemide] Nausea And Vomiting and Other (See Comments)    HEADACHE   Lithium Other (See Comments)    Dizziness, "cause me to fall"   Sulfa Antibiotics Hives   Trazodone And Nefazodone Other (See Comments)    insomnia   Lyrica [Pregabalin] Diarrhea, Nausea Only and Rash     Review of Systems: All other systems reviewed and negative except where noted in HPI.    PHYSICAL EXAM :    Wt Readings from Last 3 Encounters:  06/04/21 201 lb 1.6 oz (91.2 kg)  03/18/21 193 lb (87.5 kg)  02/12/21 195 lb 12.8 oz (88.8 kg)    LMP 08/26/1995  BP 118/68, pulse 104, weight 194 pounds, height 5 '3", BMI 34.3 Constitutional:  Generally well appearing female in no acute distress. Psychiatric: Pleasant. Normal mood and affect. Behavior is normal. EENT: Pupils normal.  Conjunctivae are normal. No scleral icterus. Neck supple.   Cardiovascular: Normal rate, regular rhythm. No edema Pulmonary/chest: Effort normal and breath sounds normal. No wheezing, rales or rhonchi. Abdominal: Soft, nondistended, nontender. Bowel sounds active throughout. There are no  masses palpable. No hepatomegaly. Rectal: No external lesions. Mild decrease in resting sphincter tone. Squeeze pressure seems adequate. No stool in vault. Gloved finger heme negative.  Neurological: Alert and oriented to person place and time. Skin: Skin is warm and dry. No rashes noted.  Tye Savoy, NP  09/18/2021, 2:08 PM

## 2021-09-18 NOTE — Progress Notes (Signed)
I agree with the above note, plan 

## 2021-09-18 NOTE — Patient Instructions (Addendum)
LABS:  Lab work has been ordered for you today. Our lab is located in the basement. Press "B" on the elevator. The lab is located at the first door on the left as you exit the elevator.  Stop Naprosyn / Naproxen until further notice  Will contact you about lab results.   Call us ASAP if you have recurrent bleeding  For the intermittent fecal incontinence please use Prep H suppositories or cream at night for 10 nights. If leakage persists then let us [place] and can refer to pelvic floor physical therapy  It was great seeing you today! Thank you for entrusting me with your care and choosing [facility].  Tye Savoy, NP  The [facility] providers would like to encourage you to use [facility] to communicate with providers for non-urgent requests or questions.  Due to long hold times on the telephone, sending your provider a message by [facility] may be faster and more efficient way to get a response. Please allow 48 business hours for a response.  Please remember that this is for non-urgent requests/questions.  If you are age 50 or older, your body mass index should be between 23-30. Your Body mass index is 34.37 kg/m. If this is out of the aforementioned range listed, please consider follow up with your Primary Care Provider.  If you are age 53 or younger, your body mass index should be between 19-25. Your Body mass index is 34.37 kg/m. If this is out of the aformentioned range listed, please consider follow up with your Primary Care Provider.

## 2021-09-19 ENCOUNTER — Telehealth: Payer: Self-pay | Admitting: Hematology and Oncology

## 2021-09-19 ENCOUNTER — Telehealth: Payer: Self-pay | Admitting: *Deleted

## 2021-09-19 LAB — IRON AND TIBC
Iron Saturation: 13 % — ABNORMAL LOW (ref 15–55)
Iron: 50 ug/dL (ref 27–139)
Total Iron Binding Capacity: 385 ug/dL (ref 250–450)
UIBC: 335 ug/dL (ref 118–369)

## 2021-09-19 LAB — SPECIMEN STATUS REPORT

## 2021-09-19 NOTE — Telephone Encounter (Signed)
Sch per 1/26 inbasket, left msg with pt

## 2021-09-19 NOTE — Telephone Encounter (Signed)
Received call from pt asking if she could change her appt to from next Monday morning to Tuesday afternoon. Advised that Tuesday afternoon is Dr. Libby Maw administrative time. Advised to keep Monday am appt. Pt is agreeable.

## 2021-09-23 ENCOUNTER — Inpatient Hospital Stay: Payer: Medicare Other | Attending: Hematology and Oncology

## 2021-09-23 ENCOUNTER — Other Ambulatory Visit: Payer: Self-pay

## 2021-09-23 ENCOUNTER — Inpatient Hospital Stay: Payer: Medicare Other | Admitting: Hematology and Oncology

## 2021-09-23 ENCOUNTER — Encounter: Payer: Self-pay | Admitting: Hematology and Oncology

## 2021-09-23 VITALS — BP 121/67 | HR 102 | Temp 97.7°F | Resp 19 | Ht 63.0 in | Wt 192.3 lb

## 2021-09-23 DIAGNOSIS — Z170 Estrogen receptor positive status [ER+]: Secondary | ICD-10-CM

## 2021-09-23 DIAGNOSIS — E039 Hypothyroidism, unspecified: Secondary | ICD-10-CM | POA: Insufficient documentation

## 2021-09-23 DIAGNOSIS — Z90710 Acquired absence of both cervix and uterus: Secondary | ICD-10-CM | POA: Diagnosis not present

## 2021-09-23 DIAGNOSIS — Z9079 Acquired absence of other genital organ(s): Secondary | ICD-10-CM | POA: Insufficient documentation

## 2021-09-23 DIAGNOSIS — C50511 Malignant neoplasm of lower-outer quadrant of right female breast: Secondary | ICD-10-CM | POA: Diagnosis not present

## 2021-09-23 DIAGNOSIS — Z9221 Personal history of antineoplastic chemotherapy: Secondary | ICD-10-CM | POA: Diagnosis not present

## 2021-09-23 DIAGNOSIS — C9110 Chronic lymphocytic leukemia of B-cell type not having achieved remission: Secondary | ICD-10-CM | POA: Insufficient documentation

## 2021-09-23 DIAGNOSIS — Z79899 Other long term (current) drug therapy: Secondary | ICD-10-CM | POA: Insufficient documentation

## 2021-09-23 DIAGNOSIS — Z9011 Acquired absence of right breast and nipple: Secondary | ICD-10-CM | POA: Diagnosis not present

## 2021-09-23 DIAGNOSIS — Z853 Personal history of malignant neoplasm of breast: Secondary | ICD-10-CM | POA: Insufficient documentation

## 2021-09-23 DIAGNOSIS — E785 Hyperlipidemia, unspecified: Secondary | ICD-10-CM | POA: Insufficient documentation

## 2021-09-23 DIAGNOSIS — D649 Anemia, unspecified: Secondary | ICD-10-CM | POA: Diagnosis not present

## 2021-09-23 DIAGNOSIS — Z87440 Personal history of urinary (tract) infections: Secondary | ICD-10-CM | POA: Diagnosis not present

## 2021-09-23 DIAGNOSIS — Z90722 Acquired absence of ovaries, bilateral: Secondary | ICD-10-CM | POA: Diagnosis not present

## 2021-09-23 DIAGNOSIS — Z7982 Long term (current) use of aspirin: Secondary | ICD-10-CM | POA: Diagnosis not present

## 2021-09-23 LAB — CBC WITH DIFFERENTIAL (CANCER CENTER ONLY)
Abs Immature Granulocytes: 0.06 10*3/uL (ref 0.00–0.07)
Basophils Absolute: 0.2 10*3/uL — ABNORMAL HIGH (ref 0.0–0.1)
Basophils Relative: 1 %
Eosinophils Absolute: 0.5 10*3/uL (ref 0.0–0.5)
Eosinophils Relative: 2 %
HCT: 38.8 % (ref 36.0–46.0)
Hemoglobin: 12.3 g/dL (ref 12.0–15.0)
Immature Granulocytes: 0 %
Lymphocytes Relative: 74 %
Lymphs Abs: 22.4 10*3/uL — ABNORMAL HIGH (ref 0.7–4.0)
MCH: 27.8 pg (ref 26.0–34.0)
MCHC: 31.7 g/dL (ref 30.0–36.0)
MCV: 87.8 fL (ref 80.0–100.0)
Monocytes Absolute: 2.2 10*3/uL — ABNORMAL HIGH (ref 0.1–1.0)
Monocytes Relative: 7 %
Neutro Abs: 4.8 10*3/uL (ref 1.7–7.7)
Neutrophils Relative %: 16 %
Platelet Count: 241 10*3/uL (ref 150–400)
RBC: 4.42 MIL/uL (ref 3.87–5.11)
RDW: 14.9 % (ref 11.5–15.5)
Smear Review: NORMAL
WBC Count: 30.1 10*3/uL — ABNORMAL HIGH (ref 4.0–10.5)
nRBC: 0 % (ref 0.0–0.2)

## 2021-09-23 LAB — CMP (CANCER CENTER ONLY)
ALT: 20 U/L (ref 0–44)
AST: 27 U/L (ref 15–41)
Albumin: 4.8 g/dL (ref 3.5–5.0)
Alkaline Phosphatase: 90 U/L (ref 38–126)
Anion gap: 8 (ref 5–15)
BUN: 20 mg/dL (ref 8–23)
CO2: 23 mmol/L (ref 22–32)
Calcium: 9.7 mg/dL (ref 8.9–10.3)
Chloride: 108 mmol/L (ref 98–111)
Creatinine: 1.21 mg/dL — ABNORMAL HIGH (ref 0.44–1.00)
GFR, Estimated: 47 mL/min — ABNORMAL LOW (ref >60)
Glucose, Bld: 102 mg/dL — ABNORMAL HIGH (ref 70–99)
Potassium: 3.9 mmol/L (ref 3.5–5.1)
Sodium: 139 mmol/L (ref 135–145)
Total Bilirubin: 0.4 mg/dL (ref 0.3–1.2)
Total Protein: 7.1 g/dL (ref 6.5–8.1)

## 2021-09-23 LAB — LACTATE DEHYDROGENASE: LDH: 264 U/L — ABNORMAL HIGH (ref 98–192)

## 2021-09-23 NOTE — Progress Notes (Signed)
[place] [phone]   [phone]  PROGRESS NOTE  Patient Care Team: Deland Pretty, MD as PCP - General (Internal Medicine) Jerline Pain, MD as PCP - Cardiology (Cardiology) Milus Banister, MD as Attending Physician (Gastroenterology) Megan Salon, MD as Consulting Physician (Gynecology) Donnie Mesa, MD as Consulting Physician (General Surgery) Noemi Chapel, NP as Nurse Practitioner Rod Can, MD as Consulting Physician (Orthopedic Surgery) Latanya Maudlin, MD as Consulting Physician (Orthopedic Surgery) Ria Clock, MD as Attending Physician (Radiology) Adele Dan, MD as Referring Physician (Orthopedic Surgery) Orson Slick, MD as Consulting Physician (Hematology and Oncology)  Hematological/Oncological History # CLL Rai Stage 1  06/04/2021: last visit with Dr. Jana Hakim. Detailed history of his care history noted below.  09/18/2021: WBC 43.3, Hgb 13.0, MCV 87.1, Plt 275 09/23/2021: establish care with Dr. Lorenso Courier   Interval History:  [name] [name] 76 y.o. 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 The patient's last visit was on 06/04/2021. In the interim since the last visit she was noted to have a WBC of 43.3 on 09/18/2021.   On exam today [name] [name] is accompanied by her daughter.  She reports that she had a urinary tract infection about 2 weeks ago and just on Friday completed her antibiotic therapy for this.  She notes that her symptoms urinary tract infections have subsequently improved.  She is not having any issues with fevers, chills, sweats, nausea, vomiting or diarrhea.  A full 10 point ROS is listed below.  On further discussion the patient is that she has been having difficulty with sleeping lately.  She normally takes amitriptyline and doubled her dose last night and did not provide her any relief.  She notes that for 2 nights  in a row she simply has not been able to sleep.  She notes that she does struggle with fibromyalgia, thoracic spine trouble, and neuropathy in her feet.  She notes that this can make her "wobbly at times".  She notes that she is not having any overt signs of bleeding, bruising, or dark stools.  She is otherwise at her baseline level of health.  MEDICAL HISTORY:  Past Medical History:  Diagnosis Date   Alopecia    Anxiety    Arthritis    "spine" (07/28/2013)   Breast cancer ([place]) 1997; 2014   right   CAP (community acquired pneumonia)    admission 06-04-2013, failed outpatient   Chronic back pain    "lower back and upper neck" (07/28/2013)   CLL (chronic lymphocytic leukemia) ([place]) 05/2013   DDD (degenerative disc disease)    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 ear    Depression    Diverticulosis    Elevated LFTs    Fibromyalgia 1978   GERD (gastroesophageal reflux disease)    Hematuria    History of syncope    episode 2008--  no recurrence since   HLD (hyperlipidemia)    Hypothyroidism    Kidney stones 2014   Plantar fasciitis    Ruptured disk    one in neck and two in back   S/P chemotherapy, time since greater than 12 weeks    Sinus tachycardia    mild resting   Skin cancer    nose   Stool incontinence    "at times recently"     SURGICAL HISTORY: Past Surgical History:  Procedure Laterality Date   ANTERIOR LATERAL LUMBAR FUSION 4  LEVELS Left 04/25/2016   Procedure: LEFT LUMBAR ONE-TWO, LUMBAR TWO-THREE, LUMBAR THREE-FOUR, LUMBAR FOUR-FIVE ANTERIOR LATERAL LUMBAR FUSION;  Surgeon: Earnie Larsson, MD;  Location: [facility];  Service: Neurosurgery;  Laterality: Left;   APPENDECTOMY  0037   APPLICATION OF ROBOTIC ASSISTANCE FOR SPINAL PROCEDURE N/A 04/06/2017   Procedure: APPLICATION OF ROBOTIC ASSISTANCE FOR SPINAL PROCEDURE;  Surgeon: Earnie Larsson, MD;  Location: [facility] [place] [facility];  Service: Neurosurgery;  Laterality: N/A;   BREAST BIOPSY Right 2014   BREAST LUMPECTOMY Right 1997   BREAST LUMPECTOMY  WITH AXILLARY LYMPH NODE DISSECTION Right 1997   CATARACT EXTRACTION, BILATERAL  2019   Dr. Kathlen Mody   CYSTOSCOPY WITH RETROGRADE PYELOGRAM, URETEROSCOPY AND STENT PLACEMENT Bilateral 06/17/2013   Procedure: CYSTOSCOPY WITH RETROGRADE PYELOGRAM, URETEROSCOPY AND LEFT DOUBLE  J STENT PLACEMENT RIGHT URETERAL HOLMIIUM LASER AND DOUBLE J STENT ;  Surgeon: Hanley Ben, MD;  Location: [facility];  Service: Urology;  Laterality: Bilateral;   HOLMIUM LASER APPLICATION Bilateral 04/88/8916   Procedure: HOLMIUM LASER APPLICATION;  Surgeon: Hanley Ben, MD;  Location: [facility];  Service: Urology;  Laterality: Bilateral;   LITHOTRIPSY Left 06/2013   LUMBAR EPIDURAL INJECTION     has had 7 injections   PORT-A-CATH REMOVAL Left 10/03/2014   Procedure: REMOVAL PORT-A-CATH;  Surgeon: Donnie Mesa, MD;  Location: [facility];  Service: General;  Laterality: Left;   PORTACATH PLACEMENT Left 07/28/2013   Procedure: ATTEMPTED INSERTION PORT-A-CATH;  Surgeon: Imogene Burn. Georgette Dover, MD;  Location: [facility];  Service: General;  Laterality: Left;   POSTERIOR LUMBAR FUSION 4 LEVEL N/A 04/25/2016   Procedure: LUMBAR FIVE-SACRAL ONE POSTERIOR LUMBAR INTERBODY FUSION, THORACIC NINE-SACRAL ONE POSTERIOR LATERAL ARTHRODESIS WITH PEDICLE SCREWS;  Surgeon: Earnie Larsson, MD;  Location: [facility];  Service: Neurosurgery;  Laterality: N/A;   RETINAL DETACHMENT SURGERY Right 2013   ROBOTIC ASSITED PARTIAL NEPHRECTOMY Right 02/05/2015   Procedure: ROBOTIC ASSITED PARTIAL NEPHRECTOMY;  Surgeon: Raynelle Bring, MD;  Location: [facility];  Service: Urology;  Laterality: Right;   SIMPLE MASTECTOMY WITH AXILLARY SENTINEL NODE BIOPSY Right 07/28/2013   Procedure: RIGHT TOTAL  MASTECTOMY;  Surgeon: Imogene Burn. Georgette Dover, MD;  Location: [facility];  Service: General;  Laterality: Right;   TONSILLECTOMY  AGE 13   TOTAL ABDOMINAL HYSTERECTOMY W/ BILATERAL SALPINGOOPHORECTOMY  1997    SOCIAL HISTORY: Social  History   Socioeconomic History   Marital status: Married    Spouse name: Philip   Number of children: 1   Years of education: Not on file   Highest education level: Not on file  Occupational History   Occupation: homemaker  Tobacco Use   Smoking status: Never   Smokeless tobacco: Never  Vaping Use   Vaping Use: Never used  Substance and Sexual Activity   Alcohol use: No   Drug use: No   Sexual activity: Not Currently    Partners: Male    Birth control/protection: Surgical    Comment: TAH/BSO  Other Topics Concern   Not on file  Social History Narrative   Not on file   Social Determinants of Health   Financial Resource Strain: Not on file  Food Insecurity: Not on file  Transportation Needs: Not on file  Physical Activity: Not on file  Stress: Not on file  Social Connections: Not on file  Intimate Partner Violence: Not on file    FAMILY HISTORY: Family History  Problem Relation Age of Onset   Heart disease Maternal Grandfather  Diabetes Maternal Grandfather    Colon cancer Maternal Aunt 45   Brain cancer Paternal Grandmother        dx in 68s   Dementia Mother    Diabetes Mother    Osteoporosis Mother    Diabetes Maternal Aunt    Prostate cancer Father 56   Bipolar disorder Maternal Aunt    Stroke Maternal Aunt    Stomach cancer Paternal Uncle        dx in late 6s    ALLERGIES:  is allergic to lasix [furosemide], lithium, sulfa antibiotics, trazodone and nefazodone, and lyrica [pregabalin].  MEDICATIONS:  Current Outpatient Medications  Medication Sig Dispense Refill   allopurinol (ZYLOPRIM) 300 MG tablet TAKE 1 TABLET(300 MG) BY MOUTH DAILY 90 tablet 4   amitriptyline (ELAVIL) 50 MG tablet TAKE 1 TABLET(50 MG) BY MOUTH AT BEDTIME 30 tablet 1   aspirin 81 MG EC tablet Take 1 tablet by mouth daily.     brexpiprazole (REXULTI) 2 MG TABS tablet 1 tablet     buPROPion (WELLBUTRIN XL) 300 MG 24 hr tablet Take 300 mg by mouth daily.     CALCIUM CITRATE PO  Take 1 tablet by mouth daily.     Cholecalciferol (VITAMIN D3) 50 MCG (2000 UT) TABS Take 1 tablet by mouth daily.     Cyanocobalamin (VITAMIN B12) 1000 MCG TBCR Take 1 tablet by mouth daily.     DULoxetine (CYMBALTA) 60 MG capsule Take 60 mg by mouth daily.     gabapentin (NEURONTIN) 100 MG capsule Take 100 mg by mouth at bedtime.     GARLIC PO Take 1 tablet by mouth daily.     levothyroxine (SYNTHROID, LEVOTHROID) 88 MCG tablet Take 88 mcg by mouth daily before breakfast.  4   Multiple Vitamins-Minerals (WOMENS 50+ MULTI VITAMIN/MIN) TABS Take 1 tablet by mouth daily.     omeprazole (PRILOSEC) 40 MG capsule TAKE 1 CAPSULE(40 MG) BY MOUTH DAILY AFTER AND BREAKFAST 90 capsule 0   rosuvastatin (CRESTOR) 5 MG tablet Take 1 tablet (5 mg total) by mouth daily. Please make overdue appt with Dr. Marlou Porch before anymore refills. Thank you 2nd attempt 15 tablet 0   traMADol (ULTRAM) 50 MG tablet Take 1 tablet (50 mg total) by mouth every 6 (six) hours as needed. 10 tablet 0   zinc gluconate 50 MG tablet Take 50 mg by mouth daily.     No current facility-administered medications for this visit.    REVIEW OF SYSTEMS:   Constitutional: ( - ) fevers, ( - )  chills , ( - ) night sweats Eyes: ( - ) blurriness of vision, ( - ) double vision, ( - ) watery eyes Ears, nose, mouth, throat, and face: ( - ) mucositis, ( - ) sore throat Respiratory: ( - ) cough, ( - ) dyspnea, ( - ) wheezes Cardiovascular: ( - ) palpitation, ( - ) chest discomfort, ( - ) lower extremity swelling Gastrointestinal:  ( - ) nausea, ( - ) heartburn, ( - ) change in bowel habits Skin: ( - ) abnormal skin rashes Lymphatics: ( - ) new lymphadenopathy, ( - ) easy bruising Neurological: ( - ) numbness, ( - ) tingling, ( - ) new weaknesses Behavioral/Psych: ( - ) mood change, ( - ) new changes  All other systems were reviewed with the patient and are negative.  PHYSICAL EXAMINATION:  Vitals:   09/23/21 1103  BP: 121/67  Pulse: (!)  102  Resp: 19  Temp:  97.7 F (36.5 C)  SpO2: 97%   Filed Weights   09/23/21 1103  Weight: 192 lb 4.8 oz (87.2 kg)    GENERAL: well appearing elderly Caucasian female. alert, no distress and comfortable SKIN: skin color, texture, turgor are normal, no rashes or significant lesions EYES: conjunctiva are pink and non-injected, sclera clear NECK: supple, non-tender LYMPH:  no palpable lymphadenopathy in the cervical, axillary or inguinal LUNGS: clear to auscultation and percussion with normal breathing effort HEART: regular rate & rhythm and no murmurs and no lower extremity edema Musculoskeletal: no cyanosis of digits and no clubbing  PSYCH: alert & oriented x 3, fluent speech NEURO: no focal motor/sensory deficits  LABORATORY DATA:  I have reviewed the data as listed CBC Latest Ref Rng & Units 09/23/2021 09/18/2021 06/04/2021  WBC 4.0 - 10.5 K/uL 30.1(H) 43.3 Repeated and verified X2.([place]) 16.0(H)  Hemoglobin 12.0 - 15.0 g/dL 12.3 13.0 11.1(L)  Hematocrit 36.0 - 46.0 % 38.8 41.6 35.0(L)  Platelets 150 - 400 K/uL 241 275.0 201    CMP Latest Ref Rng & Units 09/23/2021 10/10/2020 09/25/2020  Glucose 70 - 99 mg/dL 102(H) 101(H) 101(H)  BUN 8 - 23 mg/dL _0 Creatinine 0.44 - 1.00 mg/dL 1.21(H) 1.52(H) 0.86  Sodium 135 - 145 mmol/L 139 136 127(L)  Potassium 3.5 - 5.1 mmol/L 3.9 3.9 4.6  Chloride 98 - 111 mmol/L 108 98 94(L)  CO2 22 - 32 mmol/L 23 18(L) 16(L)  Calcium 8.9 - 10.3 mg/dL 9.7 9.1 8.5(L)  Total Protein 6.5 - 8.1 g/dL 7.1 6.3 6.2  Total Bilirubin 0.3 - 1.2 mg/dL 0.4 0.5 0.4  Alkaline Phos 38 - 126 U/L 90 184(H) 88  AST 15 - 41 U/L 27 40 70(H)  ALT 0 - 44 U/L 20 40(H) 44(H)    RADIOGRAPHIC STUDIES: No results found.  ASSESSMENT & PLAN [name] [name] 76 y.o. 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  History Adapted from Dr. Virgie Dad last note:   (1) status post right breast lumpectomy and  axillary lymph node dissection in 1997 for a stage I breast cancer, treated with adjuvant radiation and tamoxifen for 5 years   (2) status post right breast lower outer quadrant biopsy 07/13/2013 for a clinical T1c N0, stage IA invasive ductal carcinoma, grade 2, estrogen receptor 100% positive, progesterone receptor 13% positive, with an MIB-1 of 33%, and HER-2 amplification by CISH with a HER2/CEP 17 ratio of 3.19, and an average HER-2 copy number per cell of 4.15   (3) status post right mastectomy 07/28/2013 for a pT1c pN0, stage IA invasive ductal carcinoma, grade 3, with close but negative margins. Prognostic panel was not repeated             (a) the patient met with Dr. Harlow Mares and has decided against reconstruction   (4) completed weekly paclitaxel x12 12/06/2913, with trastuzumab/ pertuzumab every 3 weeks; pertuzumab was held with third dose on 11/01/2013 due to diarrhea, tried a half dose on cycle 4 again with diarrhea developing   (5) trastuzumab (started 09/20/2013) continued for 1 year, last dose 09/21/2014;             (a) final echocardiogram 09/07/2014 showed an ejection fraction of 55-60%   (6) anastrozole started May 2015; completed April 2020             (a) bone density 12/15/2013 normal             (  b) bone density 02/01/2016 at [facility] was normal with a T score of -1.0     OTHER PROBLEMS: (a) History of chronic lymphoid leukemia diagnosed by flow cytometry 06/29/2013, the cells being CD5, CD20 and CD23 positive, CD10 negative.              (1) right renal mass resected on 02/05/15, consisting of atypical lymphoid proliferation composed of monotonous small lymphocytes              (2) Anemia with a normal MCV and normal ferritin-- B-12 and folate normal; stable             (3)  ibrutinib 280 milligrams daily started 08/01/202020, discontinued 06/03/2019 because of concerns regarding dosing             (4) rituximab weekly x8 started 06/15/2019 completed December 2020              (5) maintenance rituximab (Q 2 months) started 10/25/2019, continued through 08/03/2020   # CLL Rai Stage I --WBC flared to 43.3 on 09/18/2021, appears to be trending downward, currently at 30.1 today.  -- No clear indication for treatment today based off examination.  She remains a Rai stage I with lymphadenopathy and leukocytosis but no anemia or thrombocytopenia --Last imaging of the abdomen in 2022 showed no evidence of splenomegaly --Plan for repeat of her labs in 2 weeks time with a return to clinic in 6 months time for repeat evaluation.  # ER + Breast Cancer in Survivorship -- Continue yearly mammograms. --Currently on survivorship.  No orders of the defined types were placed in this encounter.   All questions were answered. The patient knows to call the clinic with any problems, questions or concerns.  A total of more than 40 minutes were spent on this encounter with face-to-face time and non-face-to-face time, including preparing to see the patient, ordering tests and/or medications, counseling the patient and coordination of care as outlined above.   Ledell Peoples, MD Department of Hematology/Oncology [place] at [facility] Phone: [phone] Pager: [phone] Email: [name]  09/23/2021 3:29 PM

## 2021-10-06 ENCOUNTER — Other Ambulatory Visit: Payer: Self-pay | Admitting: Hematology and Oncology

## 2021-10-06 DIAGNOSIS — C9110 Chronic lymphocytic leukemia of B-cell type not having achieved remission: Secondary | ICD-10-CM

## 2021-10-07 ENCOUNTER — Inpatient Hospital Stay: Payer: Medicare Other | Attending: Hematology and Oncology

## 2021-10-07 ENCOUNTER — Other Ambulatory Visit: Payer: Self-pay

## 2021-10-07 DIAGNOSIS — C9110 Chronic lymphocytic leukemia of B-cell type not having achieved remission: Secondary | ICD-10-CM | POA: Insufficient documentation

## 2021-10-07 LAB — CBC WITH DIFFERENTIAL (CANCER CENTER ONLY)
Abs Immature Granulocytes: 0 10*3/uL (ref 0.00–0.07)
Basophils Absolute: 0 10*3/uL (ref 0.0–0.1)
Basophils Relative: 0 %
Eosinophils Absolute: 0.6 10*3/uL — ABNORMAL HIGH (ref 0.0–0.5)
Eosinophils Relative: 2 %
HCT: 39.1 % (ref 36.0–46.0)
Hemoglobin: 12.7 g/dL (ref 12.0–15.0)
Lymphocytes Relative: 71 %
Lymphs Abs: 20.1 10*3/uL — ABNORMAL HIGH (ref 0.7–4.0)
MCH: 27.9 pg (ref 26.0–34.0)
MCHC: 32.5 g/dL (ref 30.0–36.0)
MCV: 85.9 fL (ref 80.0–100.0)
Monocytes Absolute: 0.8 10*3/uL (ref 0.1–1.0)
Monocytes Relative: 3 %
Neutro Abs: 6.8 10*3/uL (ref 1.7–7.7)
Neutrophils Relative %: 24 %
Platelet Count: 198 10*3/uL (ref 150–400)
RBC: 4.55 MIL/uL (ref 3.87–5.11)
RDW: 14.6 % (ref 11.5–15.5)
WBC Count: 28.3 10*3/uL — ABNORMAL HIGH (ref 4.0–10.5)
nRBC: 0 % (ref 0.0–0.2)

## 2021-10-09 ENCOUNTER — Other Ambulatory Visit: Payer: Self-pay | Admitting: Cardiology

## 2021-10-11 ENCOUNTER — Other Ambulatory Visit: Payer: Self-pay | Admitting: *Deleted

## 2021-10-11 DIAGNOSIS — C50011 Malignant neoplasm of nipple and areola, right female breast: Secondary | ICD-10-CM

## 2021-10-11 DIAGNOSIS — C9110 Chronic lymphocytic leukemia of B-cell type not having achieved remission: Secondary | ICD-10-CM

## 2021-10-11 MED ORDER — OMEPRAZOLE 40 MG PO CPDR: DELAYED_RELEASE_CAPSULE | ORAL | 0 refills | Status: DC

## 2021-10-30 ENCOUNTER — Ambulatory Visit: Payer: Medicare Other | Admitting: Cardiology

## 2021-10-30 ENCOUNTER — Other Ambulatory Visit: Payer: Self-pay

## 2021-10-30 ENCOUNTER — Encounter: Payer: Self-pay | Admitting: Cardiology

## 2021-10-30 VITALS — BP 120/72 | HR 90 | Ht 63.0 in | Wt 187.4 lb

## 2021-10-30 DIAGNOSIS — E7800 Pure hypercholesterolemia, unspecified: Secondary | ICD-10-CM | POA: Diagnosis not present

## 2021-10-30 DIAGNOSIS — I10 Essential (primary) hypertension: Secondary | ICD-10-CM

## 2021-10-30 DIAGNOSIS — I2510 Atherosclerotic heart disease of native coronary artery without angina pectoris: Secondary | ICD-10-CM | POA: Diagnosis not present

## 2021-10-30 NOTE — Assessment & Plan Note (Signed)
Overall doing well, continuing with diet and exercise.  No antihypertensives at this time. ?

## 2021-10-30 NOTE — Assessment & Plan Note (Signed)
On Crestor 5 mg a day.  She asked about this medication.  This is important for her to continue for plaque stabilization given her mild coronary artery plaque. ?

## 2021-10-30 NOTE — Progress Notes (Signed)
Cardiology Office Note:    Date:  10/30/2021   ID:  SOSHA EMSLEY, DOB 1946/04/20, MRN [id number]  PCP:  Merri Brunette, MD   [facility] Providers Cardiologist:  Donato Schultz, MD     Referring MD: Merri Brunette, MD    History of Present Illness:    [name] [name] is a 76 y.o. female former patient of Dr. Myrtis Ser with breast cancer history of radiation chemotherapy in 2017 with shortness of breath orthostatic hypotension several echoes in the past showing stable normal left ventricular function.  Has had mild sinus tachycardia at rest in the past.  Currently heart rate 90.  She was in a skilled nursing facility after multilevel thoracic decompression and fusion.  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 Neuropathy.   Back in 2017 saw Dr. Jacinto Halim who performed an echocardiogram showing normal pulm function without any significant valvular abnormalities.  She also had nuclear stress test in 2017 that had no ischemia.  A coronary CT scan was performed on 05/18/2019 that showed a calcium score of 12 which was 40th percentile.  No evidence of flow-limiting CAD.  There were small areas of proximal LAD calcification without any surrounding stenosis.  Overall was reassuring.  Rare episodes of CP, 2-3x.  On Crestor 5mg .  Says that her face swells sometimes. Says that she has itching around her waistline at times.  Has dry skin.-Lotion may help.  Nutrisystem diet.   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   DDD (degenerative disc disease)    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 ear    Depression    Diverticulosis    Elevated LFTs    Fibromyalgia 1978   GERD (gastroesophageal reflux disease)    Hematuria     History of syncope    episode 2008--  no recurrence since   HLD (hyperlipidemia)    Hypothyroidism    Kidney stones 2014   Plantar fasciitis    Ruptured disk    one in neck and two in back   S/P chemotherapy, time since greater than 12 weeks    Sinus tachycardia    mild resting   Skin cancer    nose   Stool incontinence    "at times recently"     Past Surgical History:  Procedure Laterality Date   ANTERIOR LATERAL LUMBAR FUSION 4 LEVELS Left 04/25/2016   Procedure: LEFT LUMBAR ONE-TWO, LUMBAR TWO-THREE, LUMBAR THREE-FOUR, LUMBAR FOUR-FIVE ANTERIOR LATERAL LUMBAR FUSION;  Surgeon: Julio Sicks, MD;  Location: [facility];  Service: Neurosurgery;  Laterality: Left;   APPENDECTOMY  1997   APPLICATION OF ROBOTIC ASSISTANCE FOR SPINAL PROCEDURE N/A 04/06/2017   Procedure: APPLICATION OF ROBOTIC ASSISTANCE FOR SPINAL PROCEDURE;  Surgeon: Julio Sicks, MD;  Location: [facility] OR;  Service: Neurosurgery;  Laterality: N/A;   BREAST BIOPSY Right 2014   BREAST LUMPECTOMY Right 1997   BREAST LUMPECTOMY WITH AXILLARY LYMPH NODE DISSECTION Right 1997   CATARACT EXTRACTION, BILATERAL  2019   Dr. Alben Spittle   CYSTOSCOPY WITH RETROGRADE PYELOGRAM, URETEROSCOPY AND STENT PLACEMENT Bilateral 06/17/2013   Procedure: CYSTOSCOPY WITH RETROGRADE PYELOGRAM, URETEROSCOPY AND LEFT DOUBLE  J STENT PLACEMENT RIGHT URETERAL HOLMIIUM  LASER AND DOUBLE J STENT ;  Surgeon: Lindaann Slough, MD;  Location: [facility];  Service: Urology;  Laterality: Bilateral;   HOLMIUM LASER APPLICATION Bilateral 06/17/2013   Procedure: HOLMIUM LASER APPLICATION;  Surgeon: Lindaann Slough, MD;  Location: [facility];  Service: Urology;  Laterality: Bilateral;   LITHOTRIPSY Left 06/2013   LUMBAR EPIDURAL INJECTION     has had 7 injections   PORT-A-CATH REMOVAL Left 10/03/2014   Procedure: REMOVAL PORT-A-CATH;  Surgeon: Manus Rudd, MD;  Location: [facility];  Service: General;  Laterality: Left;    PORTACATH PLACEMENT Left 07/28/2013   Procedure: ATTEMPTED INSERTION PORT-A-CATH;  Surgeon: Wilmon Arms. Corliss Brennen Gardiner, MD;  Location: [facility];  Service: General;  Laterality: Left;   POSTERIOR LUMBAR FUSION 4 LEVEL N/A 04/25/2016   Procedure: LUMBAR FIVE-SACRAL ONE POSTERIOR LUMBAR INTERBODY FUSION, THORACIC NINE-SACRAL ONE POSTERIOR LATERAL ARTHRODESIS WITH PEDICLE SCREWS;  Surgeon: Julio Sicks, MD;  Location: [facility];  Service: Neurosurgery;  Laterality: N/A;   RETINAL DETACHMENT SURGERY Right 2013   ROBOTIC ASSITED PARTIAL NEPHRECTOMY Right 02/05/2015   Procedure: ROBOTIC ASSITED PARTIAL NEPHRECTOMY;  Surgeon: Heloise Purpura, MD;  Location: [facility];  Service: Urology;  Laterality: Right;   SIMPLE MASTECTOMY WITH AXILLARY SENTINEL NODE BIOPSY Right 07/28/2013   Procedure: RIGHT TOTAL  MASTECTOMY;  Surgeon: Wilmon Arms. Tsuei, MD;  Location: [facility];  Service: General;  Laterality: Right;   TONSILLECTOMY  AGE 28   TOTAL ABDOMINAL HYSTERECTOMY W/ BILATERAL SALPINGOOPHORECTOMY  1997    Current Medications: Current Meds  Medication Sig   allopurinol (ZYLOPRIM) 300 MG tablet TAKE 1 TABLET(300 MG) BY MOUTH DAILY   amitriptyline (ELAVIL) 100 MG tablet Take 100 mg by mouth at bedtime.   amitriptyline (ELAVIL) 50 MG tablet TAKE 1 TABLET(50 MG) BY MOUTH AT BEDTIME   Apoaequorin (PREVAGEN PO) Take by mouth daily.   aspirin 81 MG EC tablet Take 1 tablet by mouth daily.   brexpiprazole (REXULTI) 2 MG TABS tablet 1 tablet   buPROPion (WELLBUTRIN XL) 300 MG 24 hr tablet Take 300 mg by mouth daily.   CALCIUM CITRATE PO Take 1 tablet by mouth daily.   Cholecalciferol (VITAMIN D3) 50 MCG (2000 UT) TABS Take 1 tablet by mouth daily.   Cyanocobalamin (VITAMIN B12) 1000 MCG TBCR Take 1 tablet by mouth daily.   DULoxetine (CYMBALTA) 60 MG capsule Take 60 mg by mouth daily.   gabapentin (NEURONTIN) 100 MG capsule Take 100 mg by mouth at bedtime.   GARLIC PO Take 1 tablet by mouth daily.   levothyroxine (SYNTHROID, LEVOTHROID)  88 MCG tablet Take 88 mcg by mouth daily before breakfast.   Multiple Vitamins-Minerals (WOMENS 50+ MULTI VITAMIN/MIN) TABS Take 1 tablet by mouth daily.   omeprazole (PRILOSEC) 40 MG capsule TAKE 1 CAPSULE(40 MG) BY MOUTH DAILY AFTER AND BREAKFAST   rosuvastatin (CRESTOR) 5 MG tablet Take 1 tablet (5 mg total) by mouth daily.   zinc gluconate 50 MG tablet Take 50 mg by mouth daily.     Allergies:   Lasix [furosemide], Lithium, Sulfa antibiotics, Trazodone and nefazodone, and Lyrica [pregabalin]   Social History   Socioeconomic History   Marital status: Married    Spouse name: Loistine Chance   Number of children: 1   Years of education: Not on file   Highest education level: Not on file  Occupational History   Occupation: homemaker  Tobacco Use   Smoking status: Never   Smokeless tobacco: Never  Vaping Use  Vaping Use: Never used  Substance and Sexual Activity   Alcohol use: No   Drug use: No   Sexual activity: Not Currently    Partners: Male    Birth control/protection: Surgical    Comment: TAH/BSO  Other Topics Concern   Not on file  Social History Narrative   Not on file   Social Determinants of Health   Financial Resource Strain: Not on file  Food Insecurity: Not on file  Transportation Needs: Not on file  Physical Activity: Not on file  Stress: Not on file  Social Connections: Not on file     Family History: The patient's family history includes Bipolar disorder in her maternal aunt; Brain cancer in her paternal grandmother; Colon cancer (age of onset: 44) in her maternal aunt; Dementia in her mother; Diabetes in her maternal aunt, maternal grandfather, and mother; Heart disease in her maternal grandfather; Osteoporosis in her mother; Prostate cancer (age of onset: 51) in her father; Stomach cancer in her paternal uncle; Stroke in her maternal aunt.  ROS:   Please see the history of present illness.     All other systems reviewed and are negative.  EKGs/Labs/Other  Studies Reviewed:    The following studies were reviewed today: Coronary CT echocardiogram as above.  EKG:  EKG is  ordered today.  The ekg ordered today demonstrates sinus rhythm 90 no changes  Recent Labs: 09/23/2021: ALT 20; BUN 20; Creatinine 1.21; Potassium 3.9; Sodium 139 10/07/2021: Hemoglobin 12.7; Platelet Count 198  Recent Lipid Panel No results found for: CHOL, TRIG, HDL, CHOLHDL, VLDL, LDLCALC, LDLDIRECT   Risk Assessment/Calculations:              Physical Exam:    VS:  BP 120/72   Pulse 90   Ht 5\' 3"  (1.6 m)   Wt 187 lb 6.4 oz (85 kg)   LMP 08/26/1995   SpO2 97%   BMI 33.20 kg/m     Wt Readings from Last 3 Encounters:  10/30/21 187 lb 6.4 oz (85 kg)  09/23/21 192 lb 4.8 oz (87.2 kg)  09/18/21 194 lb (88 kg)     GEN:  Well nourished, well developed in no acute distress HEENT: Normal NECK: No JVD; No carotid bruits LYMPHATICS: No lymphadenopathy CARDIAC: RRR, no murmurs, no rubs, gallops RESPIRATORY:  Clear to auscultation without rales, wheezing or rhonchi  ABDOMEN: Soft, non-tender, non-distended MUSCULOSKELETAL:  No edema; No deformity  SKIN: Warm and dry NEUROLOGIC:  Alert and oriented x 3 PSYCHIATRIC:  Normal affect   ASSESSMENT:    1. Essential hypertension   2. Pure hypercholesterolemia   3. Coronary artery disease involving native coronary artery of native heart without angina pectoris    PLAN:    In order of problems listed above:  Essential hypertension Overall doing well, continuing with diet and exercise.  No antihypertensives at this time.  Pure hypercholesterolemia On Crestor 5 mg a day.  She asked about this medication.  This is important for her to continue for plaque stabilization given her mild coronary artery plaque.  Coronary artery disease involving native coronary artery of native heart without angina pectoris Very mild coronary artery calcified plaque noted on coronary CT scan in 2020.  Nonobstructive.  Continue with  Crestor.  Low-dose aspirin 81 mg also.  This would not be a cause of her shortness of breath.  Work on conditioning, exercise.  She also does not have any further evidence of tachycardia.  No need for metoprolol.  Medication Adjustments/Labs and Tests Ordered: Current medicines are reviewed at length with the patient today.  Concerns regarding medicines are outlined above.  Orders Placed This Encounter  Procedures   EKG 12-Lead   No orders of the defined types were placed in this encounter.   Patient Instructions  Medication Instructions:  The current medical regimen is effective;  continue present plan and medications.  *If you need a refill on your cardiac medications before your next appointment, please call your pharmacy*  Follow-Up: At [facility], you and your health needs are our priority.  As part of our continuing mission to provide you with exceptional heart care, we have created designated Provider Care Teams.  These Care Teams include your primary Cardiologist (physician) and Advanced Practice Providers (APPs -  Physician Assistants and Nurse Practitioners) who all work together to provide you with the care you need, when you need it.  We recommend signing up for the patient portal called "MyChart".  Sign up information is provided on this After Visit Summary.  MyChart is used to connect with patients for Virtual Visits (Telemedicine).  Patients are able to view lab/test results, encounter notes, upcoming appointments, etc.  Non-urgent messages can be sent to your provider as well.   To learn more about what you can do with MyChart, go to [email or web address].    Your next appointment:   2 year(s)  The format for your next appointment:   In Person  Provider:   Donato Schultz, MD     Thank you for choosing [facility]!!      Signed, Donato Schultz, MD  10/30/2021 4:33 PM    [facility]

## 2021-10-30 NOTE — Patient Instructions (Signed)
Medication Instructions:  ?The current medical regimen is effective;  continue present plan and medications. ? ?*If you need a refill on your cardiac medications before your next appointment, please call your pharmacy* ? ?Follow-Up: ?At [facility], you and your health needs are our priority.  As part of our continuing mission to provide you with exceptional heart care, we have created designated Provider Care Teams.  These Care Teams include your primary Cardiologist (physician) and Advanced Practice Providers (APPs -  Physician Assistants and Nurse Practitioners) who all work together to provide you with the care you need, when you need it. ? ?We recommend signing up for the patient portal called "MyChart".  Sign up information is provided on this After Visit Summary.  MyChart is used to connect with patients for Virtual Visits (Telemedicine).  Patients are able to view lab/test results, encounter notes, upcoming appointments, etc.  Non-urgent messages can be sent to your provider as well.   ?To learn more about what you can do with MyChart, go to [email or web address].   ? ?Your next appointment:   ?2 year(s) ? ?The format for your next appointment:   ?In Person ? ?Provider:   ?Candee Furbish, MD   ? ? ?Thank you for choosing [facility]!! ? ? ? ?

## 2021-10-30 NOTE — Assessment & Plan Note (Signed)
Very mild coronary artery calcified plaque noted on coronary CT scan in 2020.  Nonobstructive.  Continue with Crestor.  Low-dose aspirin 81 mg also.  This would not be a cause of her shortness of breath.  Work on conditioning, exercise.  She also does not have any further evidence of tachycardia.  No need for metoprolol. ?

## 2021-11-20 DIAGNOSIS — M546 Pain in thoracic spine: Secondary | ICD-10-CM | POA: Diagnosis not present

## 2021-11-20 DIAGNOSIS — Z6831 Body mass index (BMI) 31.0-31.9, adult: Secondary | ICD-10-CM | POA: Diagnosis not present

## 2021-11-21 ENCOUNTER — Other Ambulatory Visit: Payer: Self-pay | Admitting: Neurosurgery

## 2021-11-21 DIAGNOSIS — M546 Pain in thoracic spine: Secondary | ICD-10-CM

## 2021-12-04 ENCOUNTER — Other Ambulatory Visit: Payer: Medicare Other

## 2021-12-04 ENCOUNTER — Ambulatory Visit: Payer: Medicare Other | Admitting: Hematology and Oncology

## 2021-12-05 ENCOUNTER — Ambulatory Visit: Payer: Medicare Other

## 2021-12-19 ENCOUNTER — Ambulatory Visit
Admission: RE | Admit: 2021-12-19 | Discharge: 2021-12-19 | Disposition: A | Discharge status: Home / Self Care | Payer: Medicare Other | Source: Ambulatory Visit | Attending: Neurosurgery | Admitting: Neurosurgery

## 2021-12-19 DIAGNOSIS — M4316 Spondylolisthesis, lumbar region: Secondary | ICD-10-CM | POA: Diagnosis not present

## 2021-12-19 DIAGNOSIS — M549 Dorsalgia, unspecified: Secondary | ICD-10-CM | POA: Diagnosis not present

## 2021-12-19 DIAGNOSIS — M546 Pain in thoracic spine: Secondary | ICD-10-CM

## 2021-12-19 DIAGNOSIS — M545 Low back pain, unspecified: Secondary | ICD-10-CM | POA: Diagnosis not present

## 2021-12-24 ENCOUNTER — Other Ambulatory Visit: Payer: Self-pay | Admitting: Hematology and Oncology

## 2021-12-24 DIAGNOSIS — C50011 Malignant neoplasm of nipple and areola, right female breast: Secondary | ICD-10-CM

## 2021-12-24 DIAGNOSIS — C9110 Chronic lymphocytic leukemia of B-cell type not having achieved remission: Secondary | ICD-10-CM

## 2021-12-31 ENCOUNTER — Ambulatory Visit: Payer: Medicare Other | Admitting: Cardiology

## 2022-01-01 DIAGNOSIS — M546 Pain in thoracic spine: Secondary | ICD-10-CM | POA: Diagnosis not present

## 2022-01-01 DIAGNOSIS — Z6829 Body mass index (BMI) 29.0-29.9, adult: Secondary | ICD-10-CM | POA: Diagnosis not present

## 2022-02-19 DIAGNOSIS — Z79899 Other long term (current) drug therapy: Secondary | ICD-10-CM | POA: Diagnosis not present

## 2022-02-19 DIAGNOSIS — S39012A Strain of muscle, fascia and tendon of lower back, initial encounter: Secondary | ICD-10-CM | POA: Diagnosis not present

## 2022-02-19 DIAGNOSIS — F419 Anxiety disorder, unspecified: Secondary | ICD-10-CM | POA: Diagnosis not present

## 2022-02-19 DIAGNOSIS — E559 Vitamin D deficiency, unspecified: Secondary | ICD-10-CM | POA: Diagnosis not present

## 2022-02-19 DIAGNOSIS — D539 Nutritional anemia, unspecified: Secondary | ICD-10-CM | POA: Diagnosis not present

## 2022-02-20 ENCOUNTER — Other Ambulatory Visit: Payer: Self-pay | Admitting: Cardiology

## 2022-02-21 DIAGNOSIS — F419 Anxiety disorder, unspecified: Secondary | ICD-10-CM | POA: Diagnosis not present

## 2022-02-21 DIAGNOSIS — E039 Hypothyroidism, unspecified: Secondary | ICD-10-CM | POA: Diagnosis not present

## 2022-02-21 DIAGNOSIS — F339 Major depressive disorder, recurrent, unspecified: Secondary | ICD-10-CM | POA: Diagnosis not present

## 2022-02-21 DIAGNOSIS — N1831 Chronic kidney disease, stage 3a: Secondary | ICD-10-CM | POA: Diagnosis not present

## 2022-02-27 DIAGNOSIS — R7303 Prediabetes: Secondary | ICD-10-CM | POA: Diagnosis not present

## 2022-02-27 DIAGNOSIS — I700 Atherosclerosis of aorta: Secondary | ICD-10-CM | POA: Diagnosis not present

## 2022-02-27 DIAGNOSIS — Z7982 Long term (current) use of aspirin: Secondary | ICD-10-CM | POA: Diagnosis not present

## 2022-02-27 DIAGNOSIS — I2510 Atherosclerotic heart disease of native coronary artery without angina pectoris: Secondary | ICD-10-CM | POA: Diagnosis not present

## 2022-02-27 DIAGNOSIS — R7989 Other specified abnormal findings of blood chemistry: Secondary | ICD-10-CM | POA: Diagnosis not present

## 2022-02-27 DIAGNOSIS — Z0000 Encounter for general adult medical examination without abnormal findings: Secondary | ICD-10-CM | POA: Diagnosis not present

## 2022-02-27 DIAGNOSIS — E039 Hypothyroidism, unspecified: Secondary | ICD-10-CM | POA: Diagnosis not present

## 2022-02-27 DIAGNOSIS — K219 Gastro-esophageal reflux disease without esophagitis: Secondary | ICD-10-CM | POA: Diagnosis not present

## 2022-02-27 DIAGNOSIS — M8589 Other specified disorders of bone density and structure, multiple sites: Secondary | ICD-10-CM | POA: Diagnosis not present

## 2022-02-28 ENCOUNTER — Telehealth: Payer: Self-pay | Admitting: *Deleted

## 2022-02-28 NOTE — Telephone Encounter (Signed)
Received call from patient. She states she saw her PCP yesterday, Dr. Deland Pretty. The labs obtained there revealed WBC of around 82K. Also she has noted an enlarged lymph node in her left axillary region. Apparently Dr. Shelia Media told her she needed to be seen sooner than her scheduled appt on 7/31 and that she might need to resume her "infusions" to treat her CLL Pt will be out of town next week but will be back the week of 7//17.  Please advise if you would like to move her appt to an earlier date

## 2022-03-05 ENCOUNTER — Telehealth (HOSPITAL_BASED_OUTPATIENT_CLINIC_OR_DEPARTMENT_OTHER): Payer: Self-pay

## 2022-03-05 NOTE — Telephone Encounter (Signed)
Patient called in wanting to make an appointment for an annual visit. tbw

## 2022-03-05 NOTE — Telephone Encounter (Signed)
Called patient and left message to please call the office back to set annual appointment .

## 2022-03-06 ENCOUNTER — Ambulatory Visit: Payer: Medicare Other | Admitting: Family Medicine

## 2022-03-10 DIAGNOSIS — R21 Rash and other nonspecific skin eruption: Secondary | ICD-10-CM | POA: Diagnosis not present

## 2022-03-10 DIAGNOSIS — L299 Pruritus, unspecified: Secondary | ICD-10-CM | POA: Diagnosis not present

## 2022-03-11 ENCOUNTER — Inpatient Hospital Stay (HOSPITAL_BASED_OUTPATIENT_CLINIC_OR_DEPARTMENT_OTHER): Payer: Medicare Other | Admitting: Hematology and Oncology

## 2022-03-11 ENCOUNTER — Inpatient Hospital Stay: Payer: Medicare Other | Attending: Hematology and Oncology

## 2022-03-11 ENCOUNTER — Other Ambulatory Visit: Payer: Self-pay | Admitting: Hematology and Oncology

## 2022-03-11 ENCOUNTER — Telehealth: Payer: Self-pay | Admitting: *Deleted

## 2022-03-11 ENCOUNTER — Other Ambulatory Visit: Payer: Self-pay

## 2022-03-11 VITALS — BP 137/75 | HR 85 | Temp 97.9°F | Resp 15 | Wt 165.0 lb

## 2022-03-11 DIAGNOSIS — C9110 Chronic lymphocytic leukemia of B-cell type not having achieved remission: Secondary | ICD-10-CM | POA: Insufficient documentation

## 2022-03-11 DIAGNOSIS — I10 Essential (primary) hypertension: Secondary | ICD-10-CM | POA: Diagnosis not present

## 2022-03-11 DIAGNOSIS — E785 Hyperlipidemia, unspecified: Secondary | ICD-10-CM | POA: Diagnosis not present

## 2022-03-11 DIAGNOSIS — Z8262 Family history of osteoporosis: Secondary | ICD-10-CM | POA: Insufficient documentation

## 2022-03-11 DIAGNOSIS — Z888 Allergy status to other drugs, medicaments and biological substances status: Secondary | ICD-10-CM | POA: Insufficient documentation

## 2022-03-11 DIAGNOSIS — Z818 Family history of other mental and behavioral disorders: Secondary | ICD-10-CM | POA: Insufficient documentation

## 2022-03-11 DIAGNOSIS — Z170 Estrogen receptor positive status [ER+]: Secondary | ICD-10-CM

## 2022-03-11 DIAGNOSIS — Z79899 Other long term (current) drug therapy: Secondary | ICD-10-CM | POA: Diagnosis not present

## 2022-03-11 DIAGNOSIS — Z9049 Acquired absence of other specified parts of digestive tract: Secondary | ICD-10-CM | POA: Diagnosis not present

## 2022-03-11 DIAGNOSIS — Z8249 Family history of ischemic heart disease and other diseases of the circulatory system: Secondary | ICD-10-CM | POA: Insufficient documentation

## 2022-03-11 DIAGNOSIS — Z85828 Personal history of other malignant neoplasm of skin: Secondary | ICD-10-CM | POA: Diagnosis not present

## 2022-03-11 DIAGNOSIS — Z823 Family history of stroke: Secondary | ICD-10-CM | POA: Insufficient documentation

## 2022-03-11 DIAGNOSIS — Z882 Allergy status to sulfonamides status: Secondary | ICD-10-CM | POA: Insufficient documentation

## 2022-03-11 DIAGNOSIS — Z833 Family history of diabetes mellitus: Secondary | ICD-10-CM | POA: Insufficient documentation

## 2022-03-11 DIAGNOSIS — Z808 Family history of malignant neoplasm of other organs or systems: Secondary | ICD-10-CM | POA: Insufficient documentation

## 2022-03-11 DIAGNOSIS — Z905 Acquired absence of kidney: Secondary | ICD-10-CM | POA: Diagnosis not present

## 2022-03-11 DIAGNOSIS — Z79890 Hormone replacement therapy (postmenopausal): Secondary | ICD-10-CM | POA: Diagnosis not present

## 2022-03-11 DIAGNOSIS — Z90722 Acquired absence of ovaries, bilateral: Secondary | ICD-10-CM | POA: Insufficient documentation

## 2022-03-11 DIAGNOSIS — Z800 Family history of malignant neoplasm of digestive organs: Secondary | ICD-10-CM | POA: Diagnosis not present

## 2022-03-11 DIAGNOSIS — Z853 Personal history of malignant neoplasm of breast: Secondary | ICD-10-CM | POA: Diagnosis not present

## 2022-03-11 DIAGNOSIS — C50511 Malignant neoplasm of lower-outer quadrant of right female breast: Secondary | ICD-10-CM | POA: Diagnosis not present

## 2022-03-11 DIAGNOSIS — Z8042 Family history of malignant neoplasm of prostate: Secondary | ICD-10-CM | POA: Insufficient documentation

## 2022-03-11 LAB — CBC WITH DIFFERENTIAL (CANCER CENTER ONLY)
Abs Immature Granulocytes: 0.13 10*3/uL — ABNORMAL HIGH (ref 0.00–0.07)
Basophils Absolute: 0.3 10*3/uL — ABNORMAL HIGH (ref 0.0–0.1)
Basophils Relative: 1 %
Eosinophils Absolute: 0.7 10*3/uL — ABNORMAL HIGH (ref 0.0–0.5)
Eosinophils Relative: 1 %
HCT: 34.6 % — ABNORMAL LOW (ref 36.0–46.0)
Hemoglobin: 11.2 g/dL — ABNORMAL LOW (ref 12.0–15.0)
Immature Granulocytes: 0 %
Lymphocytes Relative: 84 %
Lymphs Abs: 48.4 10*3/uL — ABNORMAL HIGH (ref 0.7–4.0)
MCH: 28.6 pg (ref 26.0–34.0)
MCHC: 32.4 g/dL (ref 30.0–36.0)
MCV: 88.3 fL (ref 80.0–100.0)
Monocytes Absolute: 2.9 10*3/uL — ABNORMAL HIGH (ref 0.1–1.0)
Monocytes Relative: 5 %
Neutro Abs: 4.9 10*3/uL (ref 1.7–7.7)
Neutrophils Relative %: 9 %
Platelet Count: 181 10*3/uL (ref 150–400)
RBC: 3.92 MIL/uL (ref 3.87–5.11)
RDW: 15.5 % (ref 11.5–15.5)
WBC Count: 57.4 10*3/uL (ref 4.0–10.5)
nRBC: 0 % (ref 0.0–0.2)

## 2022-03-11 LAB — CMP (CANCER CENTER ONLY)
ALT: 27 U/L (ref 0–44)
AST: 28 U/L (ref 15–41)
Albumin: 4.6 g/dL (ref 3.5–5.0)
Alkaline Phosphatase: 93 U/L (ref 38–126)
Anion gap: 5 (ref 5–15)
BUN: 14 mg/dL (ref 8–23)
CO2: 26 mmol/L (ref 22–32)
Calcium: 9.5 mg/dL (ref 8.9–10.3)
Chloride: 107 mmol/L (ref 98–111)
Creatinine: 0.91 mg/dL (ref 0.44–1.00)
GFR, Estimated: >60 mL/min (ref >60)
Glucose, Bld: 92 mg/dL (ref 70–99)
Potassium: 4.2 mmol/L (ref 3.5–5.1)
Sodium: 138 mmol/L (ref 135–145)
Total Bilirubin: 0.4 mg/dL (ref 0.3–1.2)
Total Protein: 6.6 g/dL (ref 6.5–8.1)

## 2022-03-11 LAB — LACTATE DEHYDROGENASE: LDH: 286 U/L — ABNORMAL HIGH (ref 98–192)

## 2022-03-11 NOTE — Telephone Encounter (Signed)
CRITICAL VALUE STICKER  CRITICAL VALUE:WBC  RECEIVER (on-site recipient of call):Sandi K, RN  DATE & TIME NOTIFIED: 03/11/22; 2182  MESSENGER (representative from lab): [place] [facility]  MD NOTIFIED: Dr. [name]&RN  TIME OF NOTIFICATION:routed to MD [phone]  RESPONSE: info acknowledged

## 2022-03-11 NOTE — Progress Notes (Signed)
[name] [name] [phone]   [phone]  PROGRESS NOTE  Patient Care Team: Deland Pretty, MD as PCP - General (Internal Medicine) Jerline Pain, MD as PCP - Cardiology (Cardiology) Milus Banister, MD as Attending Physician (Gastroenterology) Megan Salon, MD as Consulting Physician (Gynecology) Donnie Mesa, MD as Consulting Physician (General Surgery) Noemi Chapel, NP as Nurse Practitioner Rod Can, MD as Consulting Physician (Orthopedic Surgery) Latanya Maudlin, MD as Consulting Physician (Orthopedic Surgery) Ria Clock, MD as Attending Physician (Radiology) Adele Dan, MD as Referring Physician (Orthopedic Surgery) Orson Slick, MD as Consulting Physician (Hematology and Oncology)  Hematological/Oncological History # CLL Rai Stage 1  06/04/2021: last visit with Dr. Jana Hakim. Detailed history of his care history noted below.  09/18/2021: WBC 43.3, Hgb 13.0, MCV 87.1, Plt 275 09/23/2021: establish care with Dr. Lorenso Courier. WBC 30.1, Hgb 12.3, MCV 87.8, Plt 241 03/11/2022: WBC 57.4, Hgb 11.2, MCV 88.3, Plt 181   Interval History:  [name] [name] 76 y.o. 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 The patient's last visit was on 09/23/2021. In the interim since the last visit she was noted to have a WBC of 28.3 on 10/07/2021.   On exam today [name] [name] is accompanied by her daughter.  She reports in the interim since her last visit she developed some redness and itching of her left upper extremity.  She was started on doxycycline antibiotic which she started yesterday.  She also has an ultrasound coming up to further evaluate.  If she does the arm is not painful and it is more red today than it was yesterday.  She notes that she has previously had a blood clot before that arm.  Also approximately 10 days ago she received a pneumonia shot in that arm.  She  does that she is not having any systemic symptoms at this time.  She is not having any issues with fevers, chills, sweats, nausea, vomiting or diarrhea.  Denies any bulky lymphadenopathy.  A full 10 point ROS is listed below.  MEDICAL HISTORY:  Past Medical History:  Diagnosis Date   Alopecia    Anxiety    Arthritis    "spine" (07/28/2013)   Breast cancer (Between) 1997; 2014   right   CAP (community acquired pneumonia)    admission 06-04-2013, failed outpatient   Chronic back pain    "lower back and upper neck" (07/28/2013)   CLL (chronic lymphocytic leukemia) ([facility]) 05/2013   DDD (degenerative disc disease)    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 ear    Depression    Diverticulosis    Elevated LFTs    Fibromyalgia 1978   GERD (gastroesophageal reflux disease)    Hematuria    History of syncope    episode 2008--  no recurrence since   HLD (hyperlipidemia)    Hypothyroidism    Kidney stones 2014   Plantar fasciitis    Ruptured disk    one in neck and two in back   S/P chemotherapy, time since greater than 12 weeks    Sinus tachycardia    mild resting   Skin cancer    nose   Stool incontinence    "at times recently"     SURGICAL HISTORY: Past Surgical History:  Procedure Laterality Date   ANTERIOR LATERAL LUMBAR FUSION 4 LEVELS Left 04/25/2016   Procedure: LEFT LUMBAR ONE-TWO, LUMBAR TWO-THREE, LUMBAR THREE-FOUR, LUMBAR FOUR-FIVE ANTERIOR LATERAL  LUMBAR FUSION;  Surgeon: Earnie Larsson, MD;  Location: [facility];  Service: Neurosurgery;  Laterality: Left;   APPENDECTOMY  5701   APPLICATION OF ROBOTIC ASSISTANCE FOR SPINAL PROCEDURE N/A 04/06/2017   Procedure: APPLICATION OF ROBOTIC ASSISTANCE FOR SPINAL PROCEDURE;  Surgeon: Earnie Larsson, MD;  Location: [facility];  Service: Neurosurgery;  Laterality: N/A;   BREAST BIOPSY Right 2014   BREAST LUMPECTOMY Right 1997   BREAST LUMPECTOMY WITH AXILLARY LYMPH NODE DISSECTION Right 1997   CATARACT EXTRACTION, BILATERAL  2019   Dr. Kathlen Mody   CYSTOSCOPY WITH  RETROGRADE PYELOGRAM, URETEROSCOPY AND STENT PLACEMENT Bilateral 06/17/2013   Procedure: CYSTOSCOPY WITH RETROGRADE PYELOGRAM, URETEROSCOPY AND LEFT DOUBLE  J STENT PLACEMENT RIGHT URETERAL HOLMIIUM LASER AND DOUBLE J STENT ;  Surgeon: Hanley Ben, MD;  Location: [facility];  Service: Urology;  Laterality: Bilateral;   HOLMIUM LASER APPLICATION Bilateral 77/93/9030   Procedure: HOLMIUM LASER APPLICATION;  Surgeon: Hanley Ben, MD;  Location: [facility];  Service: Urology;  Laterality: Bilateral;   LITHOTRIPSY Left 06/2013   LUMBAR EPIDURAL INJECTION     has had 7 injections   PORT-A-CATH REMOVAL Left 10/03/2014   Procedure: REMOVAL PORT-A-CATH;  Surgeon: Donnie Mesa, MD;  Location: [facility];  Service: General;  Laterality: Left;   PORTACATH PLACEMENT Left 07/28/2013   Procedure: ATTEMPTED INSERTION PORT-A-CATH;  Surgeon: Imogene Burn. Georgette Dover, MD;  Location: [facility];  Service: General;  Laterality: Left;   POSTERIOR LUMBAR FUSION 4 LEVEL N/A 04/25/2016   Procedure: LUMBAR FIVE-SACRAL ONE POSTERIOR LUMBAR INTERBODY FUSION, THORACIC NINE-SACRAL ONE POSTERIOR LATERAL ARTHRODESIS WITH PEDICLE SCREWS;  Surgeon: Earnie Larsson, MD;  Location: [facility];  Service: Neurosurgery;  Laterality: N/A;   RETINAL DETACHMENT SURGERY Right 2013   ROBOTIC ASSITED PARTIAL NEPHRECTOMY Right 02/05/2015   Procedure: ROBOTIC ASSITED PARTIAL NEPHRECTOMY;  Surgeon: Raynelle Bring, MD;  Location: [facility];  Service: Urology;  Laterality: Right;   SIMPLE MASTECTOMY WITH AXILLARY SENTINEL NODE BIOPSY Right 07/28/2013   Procedure: RIGHT TOTAL  MASTECTOMY;  Surgeon: Imogene Burn. Georgette Dover, MD;  Location: [facility];  Service: General;  Laterality: Right;   TONSILLECTOMY  AGE 89   TOTAL ABDOMINAL HYSTERECTOMY W/ BILATERAL SALPINGOOPHORECTOMY  1997    SOCIAL HISTORY: Social History   Socioeconomic History   Marital status: Married    Spouse name: Philip   Number of children: 1   Years of  education: Not on file   Highest education level: Not on file  Occupational History   Occupation: homemaker  Tobacco Use   Smoking status: Never   Smokeless tobacco: Never  Vaping Use   Vaping Use: Never used  Substance and Sexual Activity   Alcohol use: No   Drug use: No   Sexual activity: Not Currently    Partners: Male    Birth control/protection: Surgical    Comment: TAH/BSO  Other Topics Concern   Not on file  Social History Narrative   Not on file   Social Determinants of Health   Financial Resource Strain: Not on file  Food Insecurity: Not on file  Transportation Needs: Not on file  Physical Activity: Not on file  Stress: Not on file  Social Connections: Not on file  Intimate Partner Violence: Not on file    FAMILY HISTORY: Family History  Problem Relation Age of Onset   Heart disease Maternal Grandfather    Diabetes Maternal Grandfather    [name] cancer Maternal Aunt 98   Brain cancer Paternal Grandmother  dx in 18s   Dementia Mother    Diabetes Mother    Osteoporosis Mother    Diabetes Maternal Aunt    Prostate cancer Father 53   Bipolar disorder Maternal Aunt    Stroke Maternal Aunt    Stomach cancer Paternal Uncle        dx in late 69s    ALLERGIES:  is allergic to lasix [furosemide], lithium, sulfa antibiotics, trazodone and nefazodone, and lyrica [pregabalin].  MEDICATIONS:  Current Outpatient Medications  Medication Sig Dispense Refill   allopurinol (ZYLOPRIM) 300 MG tablet TAKE 1 TABLET(300 MG) BY MOUTH DAILY 90 tablet 4   amitriptyline (ELAVIL) 100 MG tablet Take 100 mg by mouth at bedtime.     Apoaequorin (PREVAGEN PO) Take by mouth daily.     aspirin 81 MG EC tablet Take 1 tablet by mouth daily.     brexpiprazole (REXULTI) 2 MG TABS tablet 1 tablet     buPROPion (WELLBUTRIN XL) 300 MG 24 hr tablet Take 300 mg by mouth daily.     CALCIUM CITRATE PO Take 1 tablet by mouth daily.     Cholecalciferol (VITAMIN D3) 50 MCG (2000 UT) TABS  Take 1 tablet by mouth daily.     Cyanocobalamin (VITAMIN B12) 1000 MCG TBCR Take 1 tablet by mouth daily.     DULoxetine (CYMBALTA) 60 MG capsule Take 60 mg by mouth daily.     gabapentin (NEURONTIN) 100 MG capsule Take 100 mg by mouth at bedtime.     GARLIC PO Take 1 tablet by mouth daily.     levothyroxine (SYNTHROID, LEVOTHROID) 88 MCG tablet Take 88 mcg by mouth daily before breakfast.  4   Multiple Vitamins-Minerals (WOMENS 50+ MULTI VITAMIN/MIN) TABS Take 1 tablet by mouth daily.     omeprazole (PRILOSEC) 40 MG capsule TAKE 1 CAPSULE(40 MG) BY MOUTH DAILY AFTER BREAKFAST 90 capsule 0   rosuvastatin (CRESTOR) 5 MG tablet TAKE 1 TABLET(5 MG) BY MOUTH DAILY 90 tablet 2   zinc gluconate 50 MG tablet Take 50 mg by mouth daily.     amitriptyline (ELAVIL) 50 MG tablet TAKE 1 TABLET(50 MG) BY MOUTH AT BEDTIME (Patient not taking: Reported on 03/11/2022) 30 tablet 1   traMADol (ULTRAM) 50 MG tablet Take 1 tablet (50 mg total) by mouth every 6 (six) hours as needed. (Patient not taking: Reported on 10/30/2021) 10 tablet 0   No current facility-administered medications for this visit.    REVIEW OF SYSTEMS:   Constitutional: ( - ) fevers, ( - )  chills , ( - ) night sweats Eyes: ( - ) blurriness of vision, ( - ) double vision, ( - ) watery eyes Ears, nose, mouth, throat, and face: ( - ) mucositis, ( - ) sore throat Respiratory: ( - ) cough, ( - ) dyspnea, ( - ) wheezes Cardiovascular: ( - ) palpitation, ( - ) chest discomfort, ( - ) lower extremity swelling Gastrointestinal:  ( - ) nausea, ( - ) heartburn, ( - ) change in bowel habits Skin: ( - ) abnormal skin rashes Lymphatics: ( - ) new lymphadenopathy, ( - ) easy bruising Neurological: ( - ) numbness, ( - ) tingling, ( - ) new weaknesses Behavioral/Psych: ( - ) mood change, ( - ) new changes  All other systems were reviewed with the patient and are negative.  PHYSICAL EXAMINATION:  Vitals:   03/11/22 0846  BP: 137/75  Pulse: 85  Resp:  15  Temp: 97.9 F (36.6  C)  SpO2: 98%   Filed Weights   03/11/22 0846  Weight: 165 lb (74.8 kg)    GENERAL: well appearing elderly Caucasian female. alert, no distress and comfortable SKIN: mild erythema on left upper arm. skin color, texture, turgor are normal, no rashes or significant lesions EYES: conjunctiva are pink and non-injected, sclera clear NECK: supple, non-tender LYMPH:  no palpable lymphadenopathy in the cervical, axillary or inguinal LUNGS: clear to auscultation and percussion with normal breathing effort HEART: regular rate & rhythm and no murmurs and no lower extremity edema Musculoskeletal: no cyanosis of digits and no clubbing  PSYCH: alert & oriented x 3, fluent speech NEURO: no focal motor/sensory deficits  LABORATORY DATA:  I have reviewed the data as listed    Latest Ref Rng & Units 03/11/2022    8:27 AM 10/07/2021    1:23 PM 09/23/2021   10:38 AM  CBC  WBC 4.0 - 10.5 K/uL 57.4  28.3  30.1   Hemoglobin 12.0 - 15.0 g/dL 11.2  12.7  12.3   Hematocrit 36.0 - 46.0 % 34.6  39.1  38.8   Platelets 150 - 400 K/uL 181  198  241        Latest Ref Rng & Units 03/11/2022    8:27 AM 09/23/2021   10:38 AM 10/10/2020    3:59 PM  CMP  Glucose 70 - 99 mg/dL 92  102  101   BUN 8 - 23 mg/dL _0 Creatinine 0.44 - 1.00 mg/dL 0.91  1.21  1.52   Sodium 135 - 145 mmol/L 138  139  136   Potassium 3.5 - 5.1 mmol/L 4.2  3.9  3.9   Chloride 98 - 111 mmol/L 107  108  98   CO2 22 - 32 mmol/L _1 Calcium 8.9 - 10.3 mg/dL 9.5  9.7  9.1   Total Protein 6.5 - 8.1 g/dL 6.6  7.1  6.3   Total Bilirubin 0.3 - 1.2 mg/dL 0.4  0.4  0.5   Alkaline Phos 38 - 126 U/L 93  90  184   AST 15 - 41 U/L 28  27  40   ALT 0 - 44 U/L 27  20  40     RADIOGRAPHIC STUDIES: No results found.  ASSESSMENT & PLAN [name] [name] 76 y.o. 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  History Adapted from Dr.  Virgie Dad last note:   (1) status post right breast lumpectomy and axillary lymph node dissection in 1997 for a stage I breast cancer, treated with adjuvant radiation and tamoxifen for 5 years   (2) status post right breast lower outer quadrant biopsy 07/13/2013 for a clinical T1c N0, stage IA invasive ductal carcinoma, grade 2, estrogen receptor 100% positive, progesterone receptor 13% positive, with an MIB-1 of 33%, and HER-2 amplification by CISH with a HER2/CEP 17 ratio of 3.19, and an average HER-2 copy number per cell of 4.15   (3) status post right mastectomy 07/28/2013 for a pT1c pN0, stage IA invasive ductal carcinoma, grade 3, with close but negative margins. Prognostic panel was not repeated             (a) the patient met with Dr. Harlow Mares and has decided against reconstruction   (4) completed weekly paclitaxel x12 12/06/2913, with trastuzumab/ pertuzumab every 3 weeks; pertuzumab was held with third dose on  11/01/2013 due to diarrhea, tried a half dose on cycle 4 again with diarrhea developing   (5) trastuzumab (started 09/20/2013) continued for 1 year, last dose 09/21/2014;             (a) final echocardiogram 09/07/2014 showed an ejection fraction of 55-60%   (6) anastrozole started May 2015; completed April 2020             (a) bone density 12/15/2013 normal             (b) bone density 02/01/2016 at [facility] was normal with a T score of -1.0     OTHER PROBLEMS: (a) History of chronic lymphoid leukemia diagnosed by flow cytometry 06/29/2013, the cells being CD5, CD20 and CD23 positive, CD10 negative.              (1) right renal mass resected on 02/05/15, consisting of atypical lymphoid proliferation composed of monotonous small lymphocytes              (2) Anemia with a normal MCV and normal ferritin-- B-12 and folate normal; stable             (3)  ibrutinib 280 milligrams daily started 03/26/2019, discontinued 06/03/2019 because of concerns regarding dosing             (4)  rituximab weekly x8 started 06/15/2019 completed December 2020             (5) maintenance rituximab (Q 2 months) started 10/25/2019, continued through 08/03/2020   # CLL Rai Stage I --WBC flared to 43.3 on 09/18/2021, appears to be highly fluctuant. Dropped to 28.3 on 10/07/2021, but now is 57.4 today. Reportedly was 82 on 02/27/2022 with PCP.   --labs today show white blood cell count 57 0.4, hemoglobin 0.2, MCV 88.3, and platelets of 181. -- No clear indication for treatment today based off examination.  She remains a Rai stage I with lymphadenopathy and leukocytosis but no anemia or thrombocytopenia --Last imaging of the abdomen in 2022 showed no evidence of splenomegaly --Return to clinic in 6 months time for repeat evaluation.  # ER + Breast Cancer in Survivorship -- Continue yearly mammograms. --last mammogram on 05/30/2021.  --Currently on survivorship.  No orders of the defined types were placed in this encounter.   All questions were answered. The patient knows to call the clinic with any problems, questions or concerns.  A total of more than 30 minutes were spent on this encounter with face-to-face time and non-face-to-face time, including preparing to see the patient, ordering tests and/or medications, counseling the patient and coordination of care as outlined above.   Ledell Peoples, MD Department of Hematology/Oncology [place] [facility] Phone: [phone] Pager: [phone] Email: [name]  03/11/2022 9:45 AM

## 2022-03-11 NOTE — Progress Notes (Signed)
I, Peterson Lombard, LAT, ATC acting as a scribe for Lynne Leader, MD.  Subjective:    CC: Neck and upper back pain  HPI: Pt is a 76 y/o female c/o neck and upper back pain x /. Of note, pt is currently being treated by Oncology for CLL and breast cancer. Pt locates pain to   Radiates:  UE Numbness/tingling: UE Weakness: Aggravates: Treatments tried:  Pertinent review of Systems: ***  Relevant historical information: ***   Objective:   There were no vitals filed for this visit. General: Well Developed, well nourished, and in no acute distress.   MSK: ***  Lab and Radiology Results Results for orders placed or performed in visit on 03/11/22 (from the past 72 hour(s))  Lactate dehydrogenase (LDH)     Status: Abnormal   Collection Time: 03/11/22  8:27 AM  Result Value Ref Range   LDH 286 (H) 98 - 192 U/L    Comment: Performed at [facility], [address] [place], [place] [address]  CMP ([place] only)     Status: None   Collection Time: 03/11/22  8:27 AM  Result Value Ref Range   Sodium 138 135 - 145 mmol/L   Potassium 4.2 3.5 - 5.1 mmol/L   Chloride 107 98 - 111 mmol/L   CO2 26 22 - 32 mmol/L   Glucose, Bld 92 70 - 99 mg/dL    Comment: Glucose reference range applies only to samples taken after fasting for at least 8 hours.   BUN 14 8 - 23 mg/dL   Creatinine 0.91 0.44 - 1.00 mg/dL   Calcium 9.5 8.9 - 10.3 mg/dL   Total Protein 6.6 6.5 - 8.1 g/dL   Albumin 4.6 3.5 - 5.0 g/dL   AST 28 15 - 41 U/L   ALT 27 0 - 44 U/L   Alkaline Phosphatase 93 38 - 126 U/L   Total Bilirubin 0.4 0.3 - 1.2 mg/dL   GFR, Estimated >60 >60 mL/min    Comment: (NOTE) Calculated using the CKD-EPI Creatinine Equation (2021)    Anion gap 5 5 - 15    Comment: Performed at [facility], [place] [address], [place], [place] [address]  CBC with Differential ([place] Only)     Status: Abnormal   Collection Time: 03/11/22  8:27 AM   Result Value Ref Range   WBC Count 57.4 (HH) 4.0 - 10.5 K/uL    Comment: This critical result has verified and been called to K. SANDY by Alonna Buckler on 07 18 2023 at 0859, and has been read back.    RBC 3.92 3.87 - 5.11 MIL/uL   Hemoglobin 11.2 (L) 12.0 - 15.0 g/dL   HCT 34.6 (L) 36.0 - 46.0 %   MCV 88.3 80.0 - 100.0 fL   MCH 28.6 26.0 - 34.0 pg   MCHC 32.4 30.0 - 36.0 g/dL   RDW 15.5 11.5 - 15.5 %   Platelet Count 181 150 - 400 K/uL   nRBC 0.0 0.0 - 0.2 %   Neutrophils Relative % 9 %   Neutro Abs 4.9 1.7 - 7.7 K/uL   Lymphocytes Relative 84 %   Lymphs Abs 48.4 (H) 0.7 - 4.0 K/uL   Monocytes Relative 5 %   Monocytes Absolute 2.9 (H) 0.1 - 1.0 K/uL   Eosinophils Relative 1 %   Eosinophils Absolute 0.7 (H) 0.0 - 0.5 K/uL   Basophils Relative 1 %   Basophils Absolute 0.3 (  H) 0.0 - 0.1 K/uL   WBC Morphology VARIANT LYMPHS SEEN    RBC Morphology MORPHOLOGY UNREMARKABLE    Smear Review LARGE AND GIANT PLATELETS SEEN    Immature Granulocytes 0 %   Abs Immature Granulocytes 0.13 (H) 0.00 - 0.07 K/uL    Comment: Performed at [facility], [place] [address] [place], [place] [address]   No results found.    Impression and Recommendations:    Assessment and Plan: 76 y.o. female with ***.  PDMP not reviewed this encounter. No orders of the defined types were placed in this encounter.  No orders of the defined types were placed in this encounter.   Discussed warning signs or symptoms. Please see discharge instructions. Patient expresses understanding.   ***

## 2022-03-12 ENCOUNTER — Ambulatory Visit (INDEPENDENT_AMBULATORY_CARE_PROVIDER_SITE_OTHER): Payer: Medicare Other | Admitting: Family Medicine

## 2022-03-12 VITALS — BP 146/86 | HR 93 | Ht 63.0 in | Wt 164.0 lb

## 2022-03-12 DIAGNOSIS — Z86718 Personal history of other venous thrombosis and embolism: Secondary | ICD-10-CM | POA: Diagnosis not present

## 2022-03-12 DIAGNOSIS — L299 Pruritus, unspecified: Secondary | ICD-10-CM | POA: Diagnosis not present

## 2022-03-12 DIAGNOSIS — M546 Pain in thoracic spine: Secondary | ICD-10-CM | POA: Diagnosis not present

## 2022-03-12 DIAGNOSIS — G8929 Other chronic pain: Secondary | ICD-10-CM

## 2022-03-12 DIAGNOSIS — M7989 Other specified soft tissue disorders: Secondary | ICD-10-CM | POA: Diagnosis not present

## 2022-03-12 NOTE — Patient Instructions (Signed)
Thank you for coming in today. Thank you for coming in today.   I've referred you to Physical Therapy.  Let us know if you don't hear from them in one week.   Recheck in 1 month.

## 2022-03-17 ENCOUNTER — Other Ambulatory Visit: Payer: Self-pay

## 2022-03-24 ENCOUNTER — Other Ambulatory Visit: Payer: Medicare Other

## 2022-03-24 ENCOUNTER — Encounter: Payer: Self-pay | Admitting: Physical Therapy

## 2022-03-24 ENCOUNTER — Ambulatory Visit: Payer: Medicare Other | Admitting: Hematology and Oncology

## 2022-03-24 ENCOUNTER — Ambulatory Visit: Payer: Medicare Other | Admitting: Physical Therapy

## 2022-03-24 DIAGNOSIS — M5459 Other low back pain: Secondary | ICD-10-CM | POA: Diagnosis not present

## 2022-03-24 DIAGNOSIS — R293 Abnormal posture: Secondary | ICD-10-CM | POA: Diagnosis not present

## 2022-03-24 NOTE — Therapy (Signed)
OUTPATIENT PHYSICAL THERAPY THORACOLUMBAR EVALUATION   Patient Name: [name] [name] MRN: [id number] DOB:May 18, 1946, 75 y.o., female Today's Date: 03/24/2022   PT End of Session - 03/24/22 2049     Visit Number 1    Number of Visits 12    Date for PT Re-Evaluation 05/05/22    Authorization Type BCBS    PT Start Time 1352    PT Stop Time 1430    PT Time Calculation (min) 38 min    Activity Tolerance Patient tolerated treatment well    Behavior During Therapy WFL for tasks assessed/performed             Past Medical History:  Diagnosis Date   Alopecia    Anxiety    Arthritis    "spine" (07/28/2013)   Breast cancer ([place]) 1997; 2014   right   CAP (community acquired pneumonia)    admission 06-04-2013, failed outpatient   Chronic back pain    "lower back and upper neck" (07/28/2013)   CLL (chronic lymphocytic leukemia) ([place]) 05/2013   DDD (degenerative disc disease)    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 ear    Depression    Diverticulosis    Elevated LFTs    Fibromyalgia 1978   GERD (gastroesophageal reflux disease)    Hematuria    History of syncope    episode 2008--  no recurrence since   HLD (hyperlipidemia)    Hypothyroidism    Kidney stones 2014   Plantar fasciitis    Ruptured disk    one in neck and two in back   S/P chemotherapy, time since greater than 12 weeks    Sinus tachycardia    mild resting   Skin cancer    nose   Stool incontinence    "at times recently"    Past Surgical History:  Procedure Laterality Date   ANTERIOR LATERAL LUMBAR FUSION 4 LEVELS Left 04/25/2016   Procedure: LEFT LUMBAR ONE-TWO, LUMBAR TWO-THREE, LUMBAR THREE-FOUR, LUMBAR FOUR-FIVE ANTERIOR LATERAL LUMBAR FUSION;  Surgeon: Earnie Larsson, MD;  Location: [facility];  Service: Neurosurgery;  Laterality: Left;   APPENDECTOMY  3382   APPLICATION OF ROBOTIC ASSISTANCE FOR SPINAL PROCEDURE N/A 04/06/2017   Procedure: APPLICATION OF ROBOTIC ASSISTANCE FOR SPINAL PROCEDURE;  Surgeon: Earnie Larsson, MD;   Location: [facility];  Service: Neurosurgery;  Laterality: N/A;   BREAST BIOPSY Right 2014   BREAST LUMPECTOMY Right 1997   BREAST LUMPECTOMY WITH AXILLARY LYMPH NODE DISSECTION Right 1997   CATARACT EXTRACTION, BILATERAL  2019   Dr. Kathlen Mody   CYSTOSCOPY WITH RETROGRADE PYELOGRAM, URETEROSCOPY AND STENT PLACEMENT Bilateral 06/17/2013   Procedure: CYSTOSCOPY WITH RETROGRADE PYELOGRAM, URETEROSCOPY AND LEFT DOUBLE  J STENT PLACEMENT RIGHT URETERAL HOLMIIUM LASER AND DOUBLE J STENT ;  Surgeon: Hanley Ben, MD;  Location: [place];  Service: Urology;  Laterality: Bilateral;   HOLMIUM LASER APPLICATION Bilateral 50/53/9767   Procedure: HOLMIUM LASER APPLICATION;  Surgeon: Hanley Ben, MD;  Location: [facility];  Service: Urology;  Laterality: Bilateral;   LITHOTRIPSY Left 06/2013   LUMBAR EPIDURAL INJECTION     has had 7 injections   PORT-A-CATH REMOVAL Left 10/03/2014   Procedure: REMOVAL PORT-A-CATH;  Surgeon: Donnie Mesa, MD;  Location: [facility];  Service: General;  Laterality: Left;   PORTACATH PLACEMENT Left 07/28/2013   Procedure: ATTEMPTED INSERTION PORT-A-CATH;  Surgeon: Imogene Burn. Georgette Dover, MD;  Location: [facility];  Service: General;  Laterality: Left;   POSTERIOR LUMBAR FUSION 4 LEVEL N/A 04/25/2016  Procedure: LUMBAR FIVE-SACRAL ONE POSTERIOR LUMBAR INTERBODY FUSION, THORACIC NINE-SACRAL ONE POSTERIOR LATERAL ARTHRODESIS WITH PEDICLE SCREWS;  Surgeon: Earnie Larsson, MD;  Location: [place] [facility];  Service: Neurosurgery;  Laterality: N/A;   RETINAL DETACHMENT SURGERY Right 2013   ROBOTIC ASSITED PARTIAL NEPHRECTOMY Right 02/05/2015   Procedure: ROBOTIC ASSITED PARTIAL NEPHRECTOMY;  Surgeon: Raynelle Bring, MD;  [facility];  Service: Urology;  Laterality: Right;   SIMPLE MASTECTOMY WITH AXILLARY SENTINEL NODE BIOPSY Right 07/28/2013   Procedure: RIGHT TOTAL  MASTECTOMY;  Surgeon: Imogene Burn. 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 09/25/2020   Superficial thrombophlebitis 08/23/2018   Iron deficiency anemia 10/01/2017   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   Status post lumbar spine operative procedure for decompression of spinal cord 05/26/2016   Depression 05/05/2016   Degenerative spondylolisthesis 04/25/2016   Spasm of muscle, back 04/25/2016   Genetic testing 11/30/2015   Essential hypertension 10/18/2015   Morbid obesity ([place]) 10/18/2015   Upper airway cough syndrome 10/17/2015   Renal mass 02/05/2015   Anemia 10/25/2013   GERD (gastroesophageal reflux disease) 09/27/2013   CLL (chronic lymphocytic leukemia) ([place]) 07/27/2013   Malignant neoplasm of lower-outer quadrant of right breast of female, estrogen receptor positive ([place]) 07/26/2013   Nephrolithiasis 06/16/2013   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 ear 05/30/2013   Chronic pain syndrome 05/10/2013   DDD (degenerative disc disease) 05/10/2013   SOB (shortness of breath)    Sinus tachycardia    HLD (hyperlipidemia)    Orthostatic hypotension    Anxiety    Arthritis    Diverticulosis    Fibromyalgia    Hypothyroidism     PCP: Deland Pretty   REFERRING PROVIDER: Lynne Leader   REFERRING DIAG: Back pain  Rationale for Evaluation and Treatment Rehabilitation  THERAPY DIAG:  Other low back pain  Abnormal posture  ONSET DATE:   SUBJECTIVE:                                                                                                                                                                                            SUBJECTIVE STATEMENT:  Pt states longstanding  pain in back, worsening. Has fusion from T9 - L1 and L2-5.  States Pain in thoracic/lumbar and cervical spine. Most pain with standing/walking activity, IADLS, up to 85/63.  Also has hip soreness with walking. Does 30 min/day on bike.  Has stairs to basement, does ok with that.    PERTINENT HISTORY:  Fibromyalgia, Hip pain, Breast CA, neuropathy.  Has had 2 previous falls with injuries.   PAIN:  Are you having pain? Yes: NPRS scale: 9-10/10 Pain location: T-spine and c-spine Pain description: Achey Aggravating factors: standing activity, IADLS Relieving factors: sitting   PRECAUTIONS: None  WEIGHT BEARING RESTRICTIONS No  FALLS:  Has patient fallen in last 6 months? No   PLOF: Independent  PATIENT GOALS  decreased pain    OBJECTIVE:   DIAGNOSTIC FINDINGS:   COGNITION:  Overall cognitive status: Within functional limits for tasks assessed     POSTURE:   Seated: leaned back posture, ppt.   PALPATION: Pain in bil,  R>L  T and L spine, more pain laterally on R,  Tenderness in bil cervical paraspinals, mild pain in central c-spine with PA s .   LUMBAR ROM: significant limitations due to fusions LE ROM: Hips: mild limitation for rot and ext.  Cervical ROM:  mild limitation for ext and rotation bil;         LOWER EXTREMITY MMT:    Hips: 4-/5,  Knees: 4/5   LUMBAR SPECIAL TESTS:     TODAY'S TREATMENT     PATIENT EDUCATION:  Education details: PT POC, exam findings, HEP Person educated: Patient Education method: Explanation, Demonstration, Tactile cues, Verbal cues, and Handouts Education comprehension: verbalized understanding, returned demonstration, verbal cues required, tactile cues required, and needs further education   HOME EXERCISE PROGRAM:   ASSESSMENT:  CLINICAL IMPRESSION: Patient presents with  primary complaint of increased pain in back and neck. She has complicated posture, with fusions from T9-L5. She has increased pain in R thoracic and lumbar region and soreness in bil neck. She has severe pain in standing with activity and IADLs. Pt with lack of effective HEP for her dx. She has limited ability for functional activity due to pain. Pt to benefit from skilled PT to improve deficits and pain.  Discussed skin discoloration on pts back, center L and R. May be her baseline. She also has some skin redness, but also used a heat pack prior to visit today. There does not appear to be a rash or any itching, pt will watch it at home, will call dr if anything changes.    OBJECTIVE IMPAIRMENTS Abnormal gait, decreased activity tolerance, decreased mobility, decreased ROM, decreased strength, increased muscle spasms, impaired flexibility, improper body mechanics, postural dysfunction, and pain.   ACTIVITY LIMITATIONS carrying, lifting, bending, standing, and squatting  PARTICIPATION LIMITATIONS: cleaning, laundry, shopping, and community activity  PERSONAL FACTORS Time since onset of injury/illness/exacerbation and excessive hardware/fusion  are also affecting patient's functional outcome.   REHAB POTENTIAL: Good  CLINICAL DECISION MAKING: Stable/uncomplicated  EVALUATION COMPLEXITY: Moderate   GOALS: Goals reviewed with patient? Yes  SHORT TERM GOALS: Target date: 04/07/2022  Pt to be independent with initial HEP  Goal status: INITIAL  2.  Pt to demo optimal seated posture   Goal status: INITIAL  3.  Pt to demo ability for sit to stand transfer with optimal mechanics, on first attempt.    Goal status: INITIAL   LONG TERM GOALS: Target date: 05/05/2022  Pt to be independent with final HEp  Goal status:  INITIAL  2.  Pt to report decreased pain in back to 0-5/10 with standing and IADLS.   Goal status: INITIAL  3.  Pt to demo optimal mechanics for bend, squat, reach, lift  for improved ability for IADLs .   Goal status: INITIAL  4.  Pt to demo improved strength of bil hips to at least 4+/5 to improve stability and pain.   Goal status: INITIAL    PLAN: PT FREQUENCY: 2x/week  PT DURATION: 6 weeks  PLANNED INTERVENTIONS: Therapeutic exercises, Therapeutic activity, Neuromuscular re-education, Balance training, Gait training, Patient/Family education, Self Care, and Joint mobilization.  PLAN FOR NEXT SESSION:    Lyndee Hensen, PT, DPT 8:51 PM  03/24/22

## 2022-03-25 ENCOUNTER — Other Ambulatory Visit: Payer: Self-pay

## 2022-03-31 ENCOUNTER — Encounter: Payer: Medicare Other | Admitting: Physical Therapy

## 2022-04-03 ENCOUNTER — Ambulatory Visit: Payer: Medicare Other | Admitting: Physical Therapy

## 2022-04-03 DIAGNOSIS — R293 Abnormal posture: Secondary | ICD-10-CM

## 2022-04-03 DIAGNOSIS — M5459 Other low back pain: Secondary | ICD-10-CM

## 2022-04-03 NOTE — Therapy (Signed)
OUTPATIENT PHYSICAL THERAPY THORACOLUMBAR TREATMENT   Patient Name: [name] [name] MRN: [id number] DOB:Jan 03, 1946, 76 y.o., female Today's Date: 04/03/2022   PT End of Session - 04/04/22 2101     Visit Number 2    Number of Visits 12    Date for PT Re-Evaluation 05/05/22    Authorization Type BCBS    PT Start Time 1348    PT Stop Time 2440    PT Time Calculation (min) 41 min    Activity Tolerance Patient tolerated treatment well    Behavior During Therapy WFL for tasks assessed/performed              Past Medical History:  Diagnosis Date   Alopecia    Anxiety    Arthritis    "spine" (07/28/2013)   Breast cancer ([place]) 1997; 2014   right   CAP (community acquired pneumonia)    admission 06-04-2013, failed outpatient   Chronic back pain    "lower back and upper neck" (07/28/2013)   CLL (chronic lymphocytic leukemia) ([place]) 05/2013   DDD (degenerative disc disease)    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 ear    Depression    Diverticulosis    Elevated LFTs    Fibromyalgia 1978   GERD (gastroesophageal reflux disease)    Hematuria    History of syncope    episode 2008--  no recurrence since   HLD (hyperlipidemia)    Hypothyroidism    Kidney stones 2014   Plantar fasciitis    Ruptured disk    one in neck and two in back   S/P chemotherapy, time since greater than 12 weeks    Sinus tachycardia    mild resting   Skin cancer    nose   Stool incontinence    "at times recently"    Past Surgical History:  Procedure Laterality Date   ANTERIOR LATERAL LUMBAR FUSION 4 LEVELS Left 04/25/2016   Procedure: LEFT LUMBAR ONE-TWO, LUMBAR TWO-THREE, LUMBAR THREE-FOUR, LUMBAR FOUR-FIVE ANTERIOR LATERAL LUMBAR FUSION;  Surgeon: Earnie Larsson, MD;  Location: [facility];  Service: Neurosurgery;  Laterality: Left;   APPENDECTOMY  1027   APPLICATION OF ROBOTIC ASSISTANCE FOR SPINAL PROCEDURE N/A 04/06/2017   Procedure: APPLICATION OF ROBOTIC ASSISTANCE FOR SPINAL PROCEDURE;  Surgeon: Earnie Larsson, MD;   Location: [facility];  Service: Neurosurgery;  Laterality: N/A;   BREAST BIOPSY Right 2014   BREAST LUMPECTOMY Right 1997   BREAST LUMPECTOMY WITH AXILLARY LYMPH NODE DISSECTION Right 1997   CATARACT EXTRACTION, BILATERAL  2019   Dr. Kathlen Mody   CYSTOSCOPY WITH RETROGRADE PYELOGRAM, URETEROSCOPY AND STENT PLACEMENT Bilateral 06/17/2013   Procedure: CYSTOSCOPY WITH RETROGRADE PYELOGRAM, URETEROSCOPY AND LEFT DOUBLE  J STENT PLACEMENT RIGHT URETERAL HOLMIIUM LASER AND DOUBLE J STENT ;  Surgeon: Hanley Ben, MD;  Location: [facility];  Service: Urology;  Laterality: Bilateral;   HOLMIUM LASER APPLICATION Bilateral 25/36/6440   Procedure: HOLMIUM LASER APPLICATION;  Surgeon: Hanley Ben, MD;  Location: [facility];  Service: Urology;  Laterality: Bilateral;   LITHOTRIPSY Left 06/2013   LUMBAR EPIDURAL INJECTION     has had 7 injections   PORT-A-CATH REMOVAL Left 10/03/2014   Procedure: REMOVAL PORT-A-CATH;  Surgeon: Donnie Mesa, MD;  Location: [facility];  Service: General;  Laterality: Left;   PORTACATH PLACEMENT Left 07/28/2013   Procedure: ATTEMPTED INSERTION PORT-A-CATH;  Surgeon: Imogene Burn. Georgette Dover, MD;  Location: [place] [facility];  Service: General;  Laterality: Left;   POSTERIOR LUMBAR FUSION 4 LEVEL N/A  04/25/2016   Procedure: LUMBAR FIVE-SACRAL ONE POSTERIOR LUMBAR INTERBODY FUSION, THORACIC NINE-SACRAL ONE POSTERIOR LATERAL ARTHRODESIS WITH PEDICLE SCREWS;  Surgeon: Earnie Larsson, MD;  Location: [place] [facility] [facility];  Service: Neurosurgery;  Laterality: N/A;   RETINAL DETACHMENT SURGERY Right 2013   ROBOTIC ASSITED PARTIAL NEPHRECTOMY Right 02/05/2015   Procedure: ROBOTIC ASSITED PARTIAL NEPHRECTOMY;  Surgeon: Raynelle Bring, MD;  [facility];  Service: Urology;  Laterality: Right;   SIMPLE MASTECTOMY WITH AXILLARY SENTINEL NODE BIOPSY Right 07/28/2013   Procedure: RIGHT TOTAL  MASTECTOMY;  Surgeon: Imogene Burn. 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 09/25/2020   Superficial thrombophlebitis 08/23/2018   Iron deficiency anemia 10/01/2017   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   Status post lumbar spine operative procedure for decompression of spinal cord 05/26/2016   Depression 05/05/2016   Degenerative spondylolisthesis 04/25/2016   Spasm of muscle, back 04/25/2016   Genetic testing 11/30/2015   Essential hypertension 10/18/2015   Morbid obesity ([place]) 10/18/2015   Upper airway cough syndrome 10/17/2015   Renal mass 02/05/2015   Anemia 10/25/2013   GERD (gastroesophageal reflux disease) 09/27/2013   CLL (chronic lymphocytic leukemia) ([place]) 07/27/2013   Malignant neoplasm of lower-outer quadrant of right breast of female, estrogen receptor positive ([place]) 07/26/2013   Nephrolithiasis 06/16/2013   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 ear 05/30/2013   Chronic pain syndrome 05/10/2013   DDD (degenerative disc disease) 05/10/2013   SOB (shortness of breath)    Sinus tachycardia    HLD (hyperlipidemia)    Orthostatic hypotension    Anxiety    Arthritis    Diverticulosis    Fibromyalgia    Hypothyroidism     PCP: Deland Pretty   REFERRING PROVIDER: Lynne Leader   REFERRING DIAG: Back pain  Rationale for Evaluation and Treatment Rehabilitation  THERAPY DIAG:  Other low back pain  Abnormal posture  ONSET DATE:   SUBJECTIVE:                                                                                                                                                                                            SUBJECTIVE STATEMENT:  Pt states significant pain today in back and neck, "like its in a vice".    Eval: Pt states longstanding pain in back, worsening. Has fusion from T9 - L1 and L2-5.  States Pain in thoracic/lumbar and cervical spine. Most pain with standing/walking activity, IADLS, up to 09/62.  Also has hip soreness with walking. Does 30 min/day on bike.  Has stairs to basement, does ok with that.    PERTINENT HISTORY:  Fibromyalgia, Hip pain, Breast CA, neuropathy.  Has had 2 previous falls with injuries.   PAIN:  Are you having pain? Yes: NPRS scale: 9-10/10 Pain location: T-spine and c-spine Pain description: Achey Aggravating factors: standing activity, IADLS Relieving factors: sitting   PRECAUTIONS: None  WEIGHT BEARING RESTRICTIONS No  FALLS:  Has patient fallen in last 6 months? No   PLOF: Independent  PATIENT GOALS  decreased pain    OBJECTIVE:   DIAGNOSTIC FINDINGS:   COGNITION:  Overall cognitive status: Within functional limits for tasks assessed     POSTURE:   Seated: leaned back posture, ppt.   PALPATION: Pain in bil,  R>L  T and L spine, more pain laterally on R,  Tenderness in bil cervical paraspinals, mild pain in central c-spine with PA s .   LUMBAR ROM: significant limitations due to fusions LE ROM: Hips: mild limitation for rot and ext.  Cervical ROM:  mild limitation for ext and rotation bil;         LOWER EXTREMITY MMT:    Hips: 4-/5,  Knees: 4/5   LUMBAR SPECIAL TESTS:     TODAY'S TREATMENT   04/03/22: Therapeutic Exercise: Aerobic: Supine: shoulder horizontal abd x 15;   SLR x 10 bil;  Seated: Standing: Scap squeeze 10;  Stretches: Shoulder flexion with cane x 15 (for thoracic stretch);  Shoulder ER butterfly x 15;  SKTC 30 sec x 4 bil;  Neuromuscular Re-education: Manual Therapy: Therapeutic Activity: Self  Care:    PATIENT EDUCATION:  Education details: PT POC, exam findings, HEP Person educated: Patient Education method: Explanation, Demonstration, Tactile cues, Verbal cues, and Handouts Education comprehension: verbalized understanding, returned demonstration, verbal cues required, tactile cues required, and needs further education   HOME EXERCISE PROGRAM: Access Code: [email or web address] URL: [email or web address] Date: 04/03/2022 Prepared by: Lyndee Hensen  Exercises - Single Knee to Chest Stretch  - 2 x daily - 3 reps - 30 hold - Supine Chest Stretch with Elbows Bent  - 1 x daily - 1 sets - 10 reps - Hooklying Shoulder T  - 1 x daily - 1 sets - 10 reps - Standing Scapular Retraction  - 1 x daily - 1- 2 sets - 5- 10 reps   ASSESSMENT:  CLINICAL IMPRESSION: Pt with most pain in mid thoracic region with standing. Ther ex done today for thoracic mobility. There is some limitation for manual with extensive fusion in T and L spine. Pt with good tolerance for ther ex today. Updated HEP to include. Plan to progress light strength , mobilization and education on posture/movement as tolerated.    OBJECTIVE IMPAIRMENTS Abnormal gait, decreased activity tolerance, decreased mobility, decreased ROM, decreased strength, increased muscle spasms, impaired flexibility, improper body mechanics, postural dysfunction, and pain.   ACTIVITY LIMITATIONS carrying, lifting, bending, standing, and squatting  PARTICIPATION LIMITATIONS: cleaning, laundry, shopping, and community activity  PERSONAL FACTORS Time since onset of injury/illness/exacerbation and excessive hardware/fusion  are also affecting patient's functional outcome.   REHAB POTENTIAL: Good  CLINICAL DECISION MAKING: Stable/uncomplicated  EVALUATION COMPLEXITY: Moderate  GOALS: Goals reviewed with patient? Yes  SHORT TERM GOALS: Target date: 04/18/2022  Pt to be independent with initial HEP  Goal status: INITIAL  2.  Pt  to demo optimal seated posture   Goal status: INITIAL  3.  Pt to demo ability for sit to stand transfer with optimal mechanics, on first attempt.    Goal status: INITIAL   LONG TERM GOALS: Target date: 05/16/2022  Pt to be independent with final HEp  Goal status: INITIAL  2.  Pt to report decreased pain in back to 0-5/10 with standing and IADLS.   Goal status: INITIAL  3.  Pt to demo optimal mechanics for bend, squat, reach, lift for improved ability for IADLs .   Goal status: INITIAL  4.  Pt to demo improved strength of bil hips to at least 4+/5 to improve stability and pain.   Goal status: INITIAL    PLAN: PT FREQUENCY: 2x/week  PT DURATION: 6 weeks  PLANNED INTERVENTIONS: Therapeutic exercises, Therapeutic activity, Neuromuscular re-education, Balance training, Gait training, Patient/Family education, Self Care, and Joint mobilization.  PLAN FOR NEXT SESSION:    Lyndee Hensen, PT, DPT 9:02 PM  04/04/22

## 2022-04-04 ENCOUNTER — Encounter: Payer: Self-pay | Admitting: Physical Therapy

## 2022-04-07 ENCOUNTER — Encounter
Payer: Medicare Other | Attending: Physical Medicine and Rehabilitation | Admitting: Physical Medicine and Rehabilitation

## 2022-04-07 VITALS — BP 103/66 | HR 87 | Ht 63.0 in | Wt 161.8 lb

## 2022-04-07 DIAGNOSIS — M546 Pain in thoracic spine: Secondary | ICD-10-CM | POA: Diagnosis not present

## 2022-04-07 DIAGNOSIS — E663 Overweight: Secondary | ICD-10-CM | POA: Diagnosis not present

## 2022-04-07 MED ORDER — MELOXICAM 15 MG PO TBDP
1.0000 | ORAL_TABLET | Freq: Every day | ORAL | 3 refills | Status: DC | PRN
Start: 2022-04-07 — End: 2022-05-26

## 2022-04-07 NOTE — Progress Notes (Signed)
Subjective:    Patient ID: [name] [name], female    DOB: 11-16-1945, 76 y.o.   MRN: [id number]  HPI [name] [name] is 76 year old woman who presents to establish care for upper back pain.  1) Upper back pain -it is present in her upper thoracic spine and radiates up into her neck -her neurosurgeon ordered a CT  -it hurts to keep her spine straight -she went to [facility] and received a shot of Toradol but that didn't help -she takes advil. -sleeping good  2) Overweight: -has lost 30 lbs using [employer]  Pain Inventory Average Pain 9 Pain Right Now 9 My pain is sharp and aching  In the last 24 hours, has pain interfered with the following? General activity 10 Relation with others 8 Enjoyment of life 10 What TIME of day is your pain at its worst? daytime Sleep (in general) Fair  Pain is worse with: walking, bending, standing, and some activites Pain improves with: rest Relief from Meds:  na  walk without assistance how many minutes can you walk? 3 ability to climb steps?  yes do you drive?  yes  retired I need assistance with the following:  household duties  weakness trouble walking depression anxiety loss of taste or smell  New pt  New pt    Family History  Problem Relation Age of Onset   Heart disease Maternal Grandfather    Diabetes Maternal Grandfather    Colon cancer Maternal Aunt 44   Brain cancer Paternal Grandmother        dx in 36s   Dementia Mother    Diabetes Mother    Osteoporosis Mother    Diabetes Maternal Aunt    Prostate cancer Father 39   Bipolar disorder Maternal Aunt    Stroke Maternal Aunt    Stomach cancer Paternal Uncle        dx in late 60s   Social History   Socioeconomic History   Marital status: Married    Spouse name: Arnette Norris   Number of children: 1   Years of education: Not on file   Highest education level: Not on file  Occupational History   Occupation: homemaker  Tobacco Use   Smoking status:  Never   Smokeless tobacco: Never  Vaping Use   Vaping Use: Never used  Substance and Sexual Activity   Alcohol use: No   Drug use: No   Sexual activity: Not Currently    Partners: Male    Birth control/protection: Surgical    Comment: TAH/BSO  Other Topics Concern   Not on file  Social History Narrative   Not on file   Social Determinants of Health   Financial Resource Strain: Not on file  Food Insecurity: Not on file  Transportation Needs: Not on file  Physical Activity: Not on file  Stress: Not on file  Social Connections: Not on file   Past Surgical History:  Procedure Laterality Date   ANTERIOR LATERAL LUMBAR FUSION 4 LEVELS Left 04/25/2016   Procedure: LEFT LUMBAR ONE-TWO, LUMBAR TWO-THREE, LUMBAR THREE-FOUR, LUMBAR FOUR-FIVE ANTERIOR LATERAL LUMBAR FUSION;  Surgeon: Earnie Larsson, MD;  Location: [facility];  Service: Neurosurgery;  Laterality: Left;   APPENDECTOMY  7867   APPLICATION OF ROBOTIC ASSISTANCE FOR SPINAL PROCEDURE N/A 04/06/2017   Procedure: APPLICATION OF ROBOTIC ASSISTANCE FOR SPINAL PROCEDURE;  Surgeon: Earnie Larsson, MD;  Location: [place];  Service: Neurosurgery;  Laterality: N/A;   BREAST BIOPSY Right 2014   BREAST LUMPECTOMY  Right 1997   BREAST LUMPECTOMY WITH AXILLARY LYMPH NODE DISSECTION Right 1997   CATARACT EXTRACTION, BILATERAL  2019   Dr. Kathlen Mody   CYSTOSCOPY WITH RETROGRADE PYELOGRAM, URETEROSCOPY AND STENT PLACEMENT Bilateral 06/17/2013   Procedure: CYSTOSCOPY WITH RETROGRADE PYELOGRAM, URETEROSCOPY AND LEFT DOUBLE  J STENT PLACEMENT RIGHT URETERAL HOLMIIUM LASER AND DOUBLE J STENT ;  Surgeon: Hanley Ben, MD;  Location: [place];  Service: Urology;  Laterality: Bilateral;   HOLMIUM LASER APPLICATION Bilateral 16/05/9603   Procedure: HOLMIUM LASER APPLICATION;  Surgeon: Hanley Ben, MD;  Location: [place];  Service: Urology;  Laterality: Bilateral;   LITHOTRIPSY Left 06/2013   LUMBAR EPIDURAL INJECTION      has had 7 injections   PORT-A-CATH REMOVAL Left 10/03/2014   Procedure: REMOVAL PORT-A-CATH;  Surgeon: Donnie Mesa, MD;  Location: [place];  Service: General;  Laterality: Left;   PORTACATH PLACEMENT Left 07/28/2013   Procedure: ATTEMPTED INSERTION PORT-A-CATH;  Surgeon: Imogene Burn. Georgette Dover, MD;  Location: [place];  Service: General;  Laterality: Left;   POSTERIOR LUMBAR FUSION 4 LEVEL N/A 04/25/2016   Procedure: LUMBAR FIVE-SACRAL ONE POSTERIOR LUMBAR INTERBODY FUSION, THORACIC NINE-SACRAL ONE POSTERIOR LATERAL ARTHRODESIS WITH PEDICLE SCREWS;  Surgeon: Earnie Larsson, MD;  Location: [place] [facility];  Service: Neurosurgery;  Laterality: N/A;   RETINAL DETACHMENT SURGERY Right 2013   ROBOTIC ASSITED PARTIAL NEPHRECTOMY Right 02/05/2015   Procedure: ROBOTIC ASSITED PARTIAL NEPHRECTOMY;  Surgeon: Raynelle Bring, MD;  Location: [facility];  Service: Urology;  Laterality: Right;   SIMPLE MASTECTOMY WITH AXILLARY SENTINEL NODE BIOPSY Right 07/28/2013   Procedure: RIGHT TOTAL  MASTECTOMY;  Surgeon: Imogene Burn. Tsuei, MD;  Location: [place];  Service: General;  Laterality: Right;   TONSILLECTOMY  AGE 21   TOTAL ABDOMINAL HYSTERECTOMY W/ BILATERAL SALPINGOOPHORECTOMY  1997   Past Medical History:  Diagnosis Date   Alopecia    Anxiety    Arthritis    "spine" (07/28/2013)   Breast cancer ([place]) 1997; 2014   right   CAP (community acquired pneumonia)    admission 06-04-2013, failed outpatient   Chronic back pain    "lower back and upper neck" (07/28/2013)   CLL (chronic lymphocytic leukemia) ([place]) 05/2013   DDD (degenerative disc disease)    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 ear    Depression    Diverticulosis    Elevated LFTs    Fibromyalgia 1978   GERD (gastroesophageal reflux disease)    Hematuria    History of syncope    episode 2008--  no recurrence since   HLD (hyperlipidemia)    Hypothyroidism    Kidney stones 2014   Plantar fasciitis    Ruptured disk    one in neck and two in back   S/P  chemotherapy, time since greater than 12 weeks    Sinus tachycardia    mild resting   Skin cancer    nose   Stool incontinence    "at times recently"    BP 103/66   Pulse 87   Ht '5\' 3"'$  (1.6 m)   Wt 161 lb 12.8 oz (73.4 kg)   LMP 08/26/1995   SpO2 92%   BMI 28.66 kg/m   Opioid Risk Score:   Fall Risk Score:  `1  Depression screen PHQ 2/9     04/07/2022    2:17 PM 10/10/2020    2:30 PM 01/30/2016    3:10 PM 11/23/2015    4:11 PM 10/05/2015    1:46 PM 09/25/2015  11:26 AM 07/13/2015    2:30 PM  Depression screen PHQ 2/9  Decreased Interest 3 0 0 0 0 0 0  Down, Depressed, Hopeless 2 0 0 0 0 0 0  PHQ - 2 Score 5 0 0 0 0 0 0  Altered sleeping 2        Tired, decreased energy 3        Change in appetite 1        Feeling bad or failure about yourself  3        Trouble concentrating 3        Moving slowly or fidgety/restless 2        Suicidal thoughts 0        PHQ-9 Score 19        Difficult doing work/chores Somewhat difficult           Review of Systems  Musculoskeletal:  Positive for back pain, gait problem and neck pain.  Neurological:  Positive for weakness.  All other systems reviewed and are negative.     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  -recommended application of blue emu oil -prescribed meloxicam '15mg'$  daily for 2 weeks, can stop earlier if pain resolves, advised to take with food, advised to not take other NSAIDs with this medication, advised me to call in 1 day if ineffective -Discussed current symptoms of pain and history  of pain.  -Discussed benefits of exercise in reducing pain. -Discussed following foods that may reduce pain: 1) Ginger (especially studied for arthritis)- reduce leukotriene production to decrease inflammation 2) Blueberries- high in phytonutrients that decrease inflammation 3) Salmon- marine omega-3s reduce joint swelling and pain 4) Pumpkin seeds- reduce inflammation 5) dark chocolate- reduces inflammation 6) turmeric- reduces inflammation 7) tart cherries - reduce pain and stiffness 8) extra virgin olive oil - its compound olecanthal helps to block prostaglandins  9) chili peppers- can be eaten or applied topically via capsaicin 10) mint- helpful for headache, muscle aches, joint pain, and itching 11) garlic- reduces inflammation  Link to further information on diet for chronic pain: http://www.randall.com/   2) Overweight -Educated that current weight is 161 lbs and current BMI is 28.66 -Educated regarding health benefits of weight loss- for pain, general health, chronic disease prevention, immune health, mental health.  -Will monitor weight every visit.  -Consider Roobois tea daily.  -Discussed the benefits of intermittent fasting. -Discussed foods that can assist in weight loss: 1) leafy greens- high in fiber and nutrients 2) dark chocolate- improves metabolism (if prefer sweetened, best to sweeten with honey instead of sugar).  3) cruciferous vegetables- high in fiber and protein 4) full fat yogurt: high in healthy fat, protein, calcium, and probiotics 5) apples- high in a variety of phytochemicals 6) nuts- high in fiber and protein that increase feelings of fullness 7) grapefruit: rich in nutrients, antioxidants, and fiber (not to be taken with anticoagulation) 8) beans- high in protein and fiber 9) salmon- has high quality protein and healthy fats 10) green tea- rich in polyphenols 11) eggs- rich in choline  and vitamin D  12) tuna- high protein, boosts metabolism 13) avocado- decreases visceral abdominal fat 14) chicken (pasture raised): high in protein and iron 15) blueberries- reduce abdominal fat and cholesterol 16) whole grains- decreases calories retained during digestion, speeds metabolism 17) chia seeds- curb appetite 18) chilies- increases fat metabolism  -Discussed supplements that can be used:  1) Metatrim '400mg'$  BID 30 minutes before breakfast and dinner  2) Sphaeranthus indicus and Garcinia mangostana (combinations of these and #1 can be found in capsicum and zychrome  3) green coffee bean extract '400mg'$  twice per day or Irvingia (african mango) 150 to '300mg'$  twice per day.

## 2022-04-07 NOTE — Patient Instructions (Signed)
Foods that may reduce pain: 1) Ginger (especially studied for arthritis)- reduce leukotriene production to decrease inflammation 2) Blueberries- high in phytonutrients that decrease inflammation 3) Salmon- marine omega-3s reduce joint swelling and pain 4) Pumpkin seeds- reduce inflammation 5) dark chocolate- reduces inflammation 6) turmeric- reduces inflammation 7) tart cherries - reduce pain and stiffness 8) extra virgin olive oil - its compound olecanthal helps to block prostaglandins  9) chili peppers- can be eaten or applied topically via capsaicin 10) mint- helpful for headache, muscle aches, joint pain, and itching 11) garlic- reduces inflammation  Link to further information on diet for chronic pain: http://www.randall.com/    Apply blue oil  Weight loss:  -Educated regarding health benefits of weight loss- for pain, general health, chronic disease prevention, immune health, mental health.  -Will monitor weight every visit.  -Consider Roobois tea daily.  -Discussed the benefits of intermittent fasting. -Discussed foods that can assist in weight loss: 1) leafy greens- high in fiber and nutrients 2) dark chocolate- improves metabolism (if prefer sweetened, best to sweeten with honey instead of sugar).  3) cruciferous vegetables- high in fiber and protein 4) full fat yogurt: high in healthy fat, protein, calcium, and probiotics 5) apples- high in a variety of phytochemicals 6) nuts- high in fiber and protein that increase feelings of fullness 7) grapefruit: rich in nutrients, antioxidants, and fiber (not to be taken with anticoagulation) 8) beans- high in protein and fiber 9) salmon- has high quality protein and healthy fats 10) green tea- rich in polyphenols 11) eggs- rich in choline and vitamin D 12) tuna- high protein, boosts metabolism 13) avocado- decreases visceral abdominal fat 14) chicken (pasture  raised): high in protein and iron 15) blueberries- reduce abdominal fat and cholesterol 16) whole grains- decreases calories retained during digestion, speeds metabolism 17) chia seeds- curb appetite 18) chilies- increases fat metabolism  -Discussed supplements that can be used:  1) Metatrim '400mg'$  BID 30 minutes before breakfast and dinner  2) Sphaeranthus indicus and Garcinia mangostana (combinations of these and #1 can be found in capsicum and zychrome  3) green coffee bean extract '400mg'$  twice per day or Irvingia (african mango) 150 to '300mg'$  twice per day. -Made goal to___

## 2022-04-08 ENCOUNTER — Inpatient Hospital Stay: Payer: Medicare Other

## 2022-04-09 ENCOUNTER — Telehealth: Payer: Self-pay | Admitting: *Deleted

## 2022-04-09 ENCOUNTER — Encounter: Payer: Self-pay | Admitting: Physical Therapy

## 2022-04-09 ENCOUNTER — Other Ambulatory Visit: Payer: Self-pay

## 2022-04-09 ENCOUNTER — Other Ambulatory Visit: Payer: Self-pay | Admitting: *Deleted

## 2022-04-09 ENCOUNTER — Inpatient Hospital Stay: Payer: Medicare Other | Attending: Hematology and Oncology

## 2022-04-09 ENCOUNTER — Ambulatory Visit: Payer: Medicare Other | Admitting: Physical Therapy

## 2022-04-09 DIAGNOSIS — Z9221 Personal history of antineoplastic chemotherapy: Secondary | ICD-10-CM | POA: Insufficient documentation

## 2022-04-09 DIAGNOSIS — Z79899 Other long term (current) drug therapy: Secondary | ICD-10-CM | POA: Diagnosis not present

## 2022-04-09 DIAGNOSIS — C9110 Chronic lymphocytic leukemia of B-cell type not having achieved remission: Secondary | ICD-10-CM | POA: Diagnosis not present

## 2022-04-09 DIAGNOSIS — Z9011 Acquired absence of right breast and nipple: Secondary | ICD-10-CM | POA: Insufficient documentation

## 2022-04-09 DIAGNOSIS — Z853 Personal history of malignant neoplasm of breast: Secondary | ICD-10-CM | POA: Diagnosis not present

## 2022-04-09 DIAGNOSIS — R293 Abnormal posture: Secondary | ICD-10-CM | POA: Diagnosis not present

## 2022-04-09 DIAGNOSIS — M5459 Other low back pain: Secondary | ICD-10-CM | POA: Diagnosis not present

## 2022-04-09 LAB — CBC WITH DIFFERENTIAL (CANCER CENTER ONLY)
Abs Immature Granulocytes: 0.14 10*3/uL — ABNORMAL HIGH (ref 0.00–0.07)
Basophils Absolute: 0.1 10*3/uL (ref 0.0–0.1)
Basophils Relative: 0 %
Eosinophils Absolute: 0.7 10*3/uL — ABNORMAL HIGH (ref 0.0–0.5)
Eosinophils Relative: 1 %
HCT: 33.8 % — ABNORMAL LOW (ref 36.0–46.0)
Hemoglobin: 11.2 g/dL — ABNORMAL LOW (ref 12.0–15.0)
Immature Granulocytes: 0 %
Lymphocytes Relative: 84 %
Lymphs Abs: 56 10*3/uL — ABNORMAL HIGH (ref 0.7–4.0)
MCH: 29.2 pg (ref 26.0–34.0)
MCHC: 33.1 g/dL (ref 30.0–36.0)
MCV: 88.3 fL (ref 80.0–100.0)
Monocytes Absolute: 4.2 10*3/uL — ABNORMAL HIGH (ref 0.1–1.0)
Monocytes Relative: 6 %
Neutro Abs: 5.6 10*3/uL (ref 1.7–7.7)
Neutrophils Relative %: 9 %
Platelet Count: 161 10*3/uL (ref 150–400)
RBC: 3.83 MIL/uL — ABNORMAL LOW (ref 3.87–5.11)
RDW: 14.9 % (ref 11.5–15.5)
WBC Count: 66.7 10*3/uL (ref 4.0–10.5)
nRBC: 0 % (ref 0.0–0.2)

## 2022-04-09 LAB — CMP (CANCER CENTER ONLY)
ALT: 22 U/L (ref 0–44)
AST: 31 U/L (ref 15–41)
Albumin: 4.5 g/dL (ref 3.5–5.0)
Alkaline Phosphatase: 81 U/L (ref 38–126)
Anion gap: 7 (ref 5–15)
BUN: 15 mg/dL (ref 8–23)
CO2: 22 mmol/L (ref 22–32)
Calcium: 9.6 mg/dL (ref 8.9–10.3)
Chloride: 102 mmol/L (ref 98–111)
Creatinine: 1.07 mg/dL — ABNORMAL HIGH (ref 0.44–1.00)
GFR, Estimated: 54 mL/min — ABNORMAL LOW (ref >60)
Glucose, Bld: 127 mg/dL — ABNORMAL HIGH (ref 70–99)
Potassium: 4.8 mmol/L (ref 3.5–5.1)
Sodium: 131 mmol/L — ABNORMAL LOW (ref 135–145)
Total Bilirubin: 0.5 mg/dL (ref 0.3–1.2)
Total Protein: 6.8 g/dL (ref 6.5–8.1)

## 2022-04-09 LAB — LACTATE DEHYDROGENASE: LDH: 251 U/L — ABNORMAL HIGH (ref 98–192)

## 2022-04-09 NOTE — Therapy (Signed)
OUTPATIENT PHYSICAL THERAPY THORACOLUMBAR TREATMENT   Patient Name: [name] [name] MRN: [id number] DOB:[birth year], 76 y.o., female Today's Date: 04/09/2022   PT End of Session - 04/09/22 1103     Visit Number 3    Number of Visits 12    Date for PT Re-Evaluation 05/05/22    Authorization Type BCBS    PT Start Time 1106    PT Stop Time 1142    PT Time Calculation (min) 36 min    Activity Tolerance Patient tolerated treatment well    Behavior During Therapy WFL for tasks assessed/performed              Past Medical History:  Diagnosis Date   Alopecia    Anxiety    Arthritis    "spine" (07/28/2013)   Breast cancer ([place]) 1997; 2014   right   CAP (community acquired pneumonia)    admission 06-04-2013, failed outpatient   Chronic back pain    "lower back and upper neck" (07/28/2013)   CLL (chronic lymphocytic leukemia) ([place]) 05/2013   DDD (degenerative disc disease)    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 ear    Depression    Diverticulosis    Elevated LFTs    Fibromyalgia 1978   GERD (gastroesophageal reflux disease)    Hematuria    History of syncope    episode 2008--  no recurrence since   HLD (hyperlipidemia)    Hypothyroidism    Kidney stones 2014   Plantar fasciitis    Ruptured disk    one in neck and two in back   S/P chemotherapy, time since greater than 12 weeks    Sinus tachycardia    mild resting   Skin cancer    nose   Stool incontinence    "at times recently"    Past Surgical History:  Procedure Laterality Date   ANTERIOR LATERAL LUMBAR FUSION 4 LEVELS Left 04/25/2016   Procedure: LEFT LUMBAR ONE-TWO, LUMBAR TWO-THREE, LUMBAR THREE-FOUR, LUMBAR FOUR-FIVE ANTERIOR LATERAL LUMBAR FUSION;  Surgeon: Earnie Larsson, MD;  Location: [place] [facility];  Service: Neurosurgery;  Laterality: Left;   APPENDECTOMY  0175   APPLICATION OF ROBOTIC ASSISTANCE FOR SPINAL PROCEDURE N/A 04/06/2017   Procedure: APPLICATION OF ROBOTIC ASSISTANCE FOR SPINAL PROCEDURE;  Surgeon: Earnie Larsson, MD;   Location: [place];  Service: Neurosurgery;  Laterality: N/A;   BREAST BIOPSY Right 2014   BREAST LUMPECTOMY Right 1997   BREAST LUMPECTOMY WITH AXILLARY LYMPH NODE DISSECTION Right 1997   CATARACT EXTRACTION, BILATERAL  2019   Dr. Kathlen Mody   CYSTOSCOPY WITH RETROGRADE PYELOGRAM, URETEROSCOPY AND STENT PLACEMENT Bilateral 06/17/2013   Procedure: CYSTOSCOPY WITH RETROGRADE PYELOGRAM, URETEROSCOPY AND LEFT DOUBLE  J STENT PLACEMENT RIGHT URETERAL HOLMIIUM LASER AND DOUBLE J STENT ;  Surgeon: Hanley Ben, MD;  Location: [place];  Service: Urology;  Laterality: Bilateral;   HOLMIUM LASER APPLICATION Bilateral 06/18/8526   Procedure: HOLMIUM LASER APPLICATION;  Surgeon: Hanley Ben, MD;  Location: [place];  Service: Urology;  Laterality: Bilateral;   LITHOTRIPSY Left 06/2013   LUMBAR EPIDURAL INJECTION     has had 7 injections   PORT-A-CATH REMOVAL Left 10/03/2014   Procedure: REMOVAL PORT-A-CATH;  Surgeon: Donnie Mesa, MD;  Location: [place];  Service: General;  Laterality: Left;   PORTACATH PLACEMENT Left 07/28/2013   Procedure: ATTEMPTED INSERTION PORT-A-CATH;  Surgeon: Imogene Burn. Georgette Dover, MD;  Location: [place];  Service: General;  Laterality: Left;   POSTERIOR LUMBAR FUSION 4 LEVEL N/A  04/25/2016   Procedure: LUMBAR FIVE-SACRAL ONE POSTERIOR LUMBAR INTERBODY FUSION, THORACIC NINE-SACRAL ONE POSTERIOR LATERAL ARTHRODESIS WITH PEDICLE SCREWS;  Surgeon: Earnie Larsson, MD;  Location: [place] [facility];  Service: Neurosurgery;  Laterality: N/A;   RETINAL DETACHMENT SURGERY Right 2013   ROBOTIC ASSITED PARTIAL NEPHRECTOMY Right 02/05/2015   Procedure: ROBOTIC ASSITED PARTIAL NEPHRECTOMY;  Surgeon: Raynelle Bring, MD;  [facility];  Service: Urology;  Laterality: Right;   SIMPLE MASTECTOMY WITH AXILLARY SENTINEL NODE BIOPSY Right 07/28/2013   Procedure: RIGHT TOTAL  MASTECTOMY;  Surgeon: Imogene Burn. 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 09/25/2020   Superficial thrombophlebitis 08/23/2018   Iron deficiency anemia 10/01/2017   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   Status post lumbar spine operative procedure for decompression of spinal cord 05/26/2016   Depression 05/05/2016   Degenerative spondylolisthesis 04/25/2016   Spasm of muscle, back 04/25/2016   Genetic testing 11/30/2015   Essential hypertension 10/18/2015   Morbid obesity ([place]) 10/18/2015   Upper airway cough syndrome 10/17/2015   Renal mass 02/05/2015   Anemia 10/25/2013   GERD (gastroesophageal reflux disease) 09/27/2013   CLL (chronic lymphocytic leukemia) ([place]) 07/27/2013   Malignant neoplasm of lower-outer quadrant of right breast of female, estrogen receptor positive ([place]) 07/26/2013   Nephrolithiasis 06/16/2013   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 ear 05/30/2013   Chronic pain syndrome 05/10/2013   DDD (degenerative disc disease) 05/10/2013   SOB (shortness of breath)    Sinus tachycardia    HLD (hyperlipidemia)    Orthostatic hypotension    Anxiety    Arthritis    Diverticulosis    Fibromyalgia    Hypothyroidism     PCP: Deland Pretty   REFERRING PROVIDER: Lynne Leader   REFERRING DIAG: Back pain  Rationale for Evaluation and Treatment Rehabilitation  THERAPY DIAG:  Other low back pain  Abnormal posture  ONSET DATE:   SUBJECTIVE:                                                                                                                                                                                            SUBJECTIVE STATEMENT:  Pt states continued pain with standing activity, can stand about 5-10 min for activities at home before she needs to sit down.    Eval: Pt states longstanding pain in back, worsening. Has fusion from T9 - L1 and L2-5.  States Pain in thoracic/lumbar and cervical spine. Most pain with standing/walking activity, IADLS, up to 78/58.  Also has hip soreness with walking. Does 30 min/day on bike.  Has stairs to basement, does ok with that.    PERTINENT HISTORY:  Fibromyalgia, Hip pain, Breast CA, neuropathy.  Has had 2 previous falls with injuries.   PAIN:  Are you having pain? Yes: NPRS scale: 9-10/10 Pain location: T-spine and c-spine Pain description: [place] Aggravating factors: standing activity, IADLS Relieving factors: sitting   PRECAUTIONS: None  WEIGHT BEARING RESTRICTIONS No  FALLS:  Has patient fallen in last 6 months? No   PLOF: Independent  PATIENT GOALS  decreased pain    OBJECTIVE:   DIAGNOSTIC FINDINGS:   COGNITION:  Overall cognitive status: Within functional limits for tasks assessed     POSTURE:   Seated: leaned back posture, ppt.   PALPATION: Pain in bil,  R>L  T and L spine, more pain laterally on R,  Tenderness in bil cervical paraspinals, mild pain in central c-spine with PA s .   LUMBAR ROM: significant limitations due to fusions LE ROM: Hips: mild limitation for rot and ext.  Cervical ROM:  mild limitation for ext and rotation bil;         LOWER EXTREMITY MMT:    Hips: 4-/5,  Knees: 4/5   LUMBAR SPECIAL TESTS:     TODAY'S TREATMENT  04/09/22: Therapeutic Exercise: Aerobic: Supine: shoulder horizontal abd x 15;   SLR x 10 bil; Glute sets x 10;  Seated:Sit to stand x 10, practice for fwd lean.  Standing: Scap squeeze 10; March x 20; mini quats x 20; Education and practice for bend/reach to floor x 5;  Stretches:  Shoulder flexion x 15 (for thoracic stretch);  Shoulder ER butterfly x 15;  SKTC 30 sec x 4 bil;  Neuromuscular Re-education: Manual Therapy: Therapeutic Activity: Self Care:   04/03/22: Therapeutic Exercise: Aerobic: Supine: shoulder horizontal abd x 15;   SLR x 10 bil;  Seated:  Standing: Scap squeeze 10;   Stretches: Shoulder flexion with cane x 15 (for thoracic stretch);  Shoulder ER butterfly x 15;  SKTC 30 sec x 4 bil;  Neuromuscular Re-education: Manual Therapy: Therapeutic Activity: Self Care:    PATIENT EDUCATION:  Education details: PT POC, exam findings, HEP Person educated: Patient Education method: Explanation, Demonstration, Tactile cues, Verbal cues, and Handouts Education comprehension: verbalized understanding, returned demonstration, verbal cues required, tactile cues required, and needs further education   HOME EXERCISE PROGRAM: Access Code: [email or web address] URL: [email or web address] Date: 04/03/2022 Prepared by: Lyndee Hensen  Exercises - Single Knee to Chest Stretch  - 2 x daily - 3 reps - 30 hold - Supine Chest Stretch with Elbows Bent  - 1 x daily - 1 sets - 10 reps - Hooklying Shoulder T  - 1 x daily - 1 sets - 10 reps - Standing Scapular Retraction  - 1 x daily - 1- 2 sets - 5- 10 reps   ASSESSMENT:  CLINICAL IMPRESSION: Pt with good tolerance for activity today. Education and practice for optimal mechanics with sit to stand and bend/squat. Cueing for keeping back straight and more use of LEs. Pt to benefit from continued/progressive strengthening.    OBJECTIVE IMPAIRMENTS Abnormal gait,  decreased activity tolerance, decreased mobility, decreased ROM, decreased strength, increased muscle spasms, impaired flexibility, improper body mechanics, postural dysfunction, and pain.   ACTIVITY LIMITATIONS carrying, lifting, bending, standing, and squatting  PARTICIPATION LIMITATIONS: cleaning, laundry, shopping, and community  activity  PERSONAL FACTORS Time since onset of injury/illness/exacerbation and excessive hardware/fusion  are also affecting patient's functional outcome.   REHAB POTENTIAL: Good  CLINICAL DECISION MAKING: Stable/uncomplicated  EVALUATION COMPLEXITY: Moderate   GOALS: Goals reviewed with patient? Yes  SHORT TERM GOALS: Target date: 04/23/2022  Pt to be independent with initial HEP  Goal status: INITIAL  2.  Pt to demo optimal seated posture   Goal status: INITIAL  3.  Pt to demo ability for sit to stand transfer with optimal mechanics, on first attempt.    Goal status: INITIAL   LONG TERM GOALS: Target date: 05/21/2022  Pt to be independent with final HEp  Goal status: INITIAL  2.  Pt to report decreased pain in back to 0-5/10 with standing and IADLS.   Goal status: INITIAL  3.  Pt to demo optimal mechanics for bend, squat, reach, lift for improved ability for IADLs .   Goal status: INITIAL  4.  Pt to demo improved strength of bil hips to at least 4+/5 to improve stability and pain.   Goal status: INITIAL    PLAN: PT FREQUENCY: 2x/week  PT DURATION: 6 weeks  PLANNED INTERVENTIONS: Therapeutic exercises, Therapeutic activity, Neuromuscular re-education, Balance training, Gait training, Patient/Family education, Self Care, and Joint mobilization.  PLAN FOR NEXT SESSION:    Lyndee Hensen, PT, DPT 11:48 AM  04/09/22

## 2022-04-09 NOTE — Telephone Encounter (Signed)
CRITICAL VALUE STICKER  CRITICAL VALUE:  WBC 66.7 k  RECEIVER (on-site recipient of call): Drucie Ip, RN  DATE & TIME NOTIFIED: 04/09/22 1356  MESSENGER (representative from lab): Pam  MD NOTIFIED: Dede Query, PA  TIME OF NOTIFICATION:1358  RESPONSE:  Acknowledged rsult

## 2022-04-11 ENCOUNTER — Encounter: Payer: Self-pay | Admitting: Physical Therapy

## 2022-04-11 ENCOUNTER — Ambulatory Visit: Payer: Medicare Other | Admitting: Physical Therapy

## 2022-04-11 DIAGNOSIS — M5459 Other low back pain: Secondary | ICD-10-CM

## 2022-04-11 DIAGNOSIS — R293 Abnormal posture: Secondary | ICD-10-CM | POA: Diagnosis not present

## 2022-04-11 NOTE — Therapy (Signed)
OUTPATIENT PHYSICAL THERAPY THORACOLUMBAR TREATMENT   Patient Name: [name] [name] MRN: [id number] DOB:Jun 21, 1946, 76 y.o., female Today's Date: 04/11/2022   PT End of Session - 04/11/22 1235     Visit Number 4    Number of Visits 12    Date for PT Re-Evaluation 05/05/22    Authorization Type BCBS    PT Start Time 1236    PT Stop Time 1314    PT Time Calculation (min) 38 min    Activity Tolerance Patient tolerated treatment well    Behavior During Therapy WFL for tasks assessed/performed              Past Medical History:  Diagnosis Date   Alopecia    Anxiety    Arthritis    "spine" (07/28/2013)   Breast cancer ([place]) 1997; 2014   right   CAP (community acquired pneumonia)    admission 06-04-2013, failed outpatient   Chronic back pain    "lower back and upper neck" (07/28/2013)   CLL (chronic lymphocytic leukemia) ([place]) 05/2013   DDD (degenerative disc disease)    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 ear    Depression    Diverticulosis    Elevated LFTs    Fibromyalgia 1978   GERD (gastroesophageal reflux disease)    Hematuria    History of syncope    episode 2008--  no recurrence since   HLD (hyperlipidemia)    Hypothyroidism    Kidney stones 2014   Plantar fasciitis    Ruptured disk    one in neck and two in back   S/P chemotherapy, time since greater than 12 weeks    Sinus tachycardia    mild resting   Skin cancer    nose   Stool incontinence    "at times recently"    Past Surgical History:  Procedure Laterality Date   ANTERIOR LATERAL LUMBAR FUSION 4 LEVELS Left 04/25/2016   Procedure: LEFT LUMBAR ONE-TWO, LUMBAR TWO-THREE, LUMBAR THREE-FOUR, LUMBAR FOUR-FIVE ANTERIOR LATERAL LUMBAR FUSION;  Surgeon: Earnie Larsson, MD;  Location: [facility];  Service: Neurosurgery;  Laterality: Left;   APPENDECTOMY  2671   APPLICATION OF ROBOTIC ASSISTANCE FOR SPINAL PROCEDURE N/A 04/06/2017   Procedure: APPLICATION OF ROBOTIC ASSISTANCE FOR SPINAL PROCEDURE;  Surgeon: Earnie Larsson, MD;   Location: [facility];  Service: Neurosurgery;  Laterality: N/A;   BREAST BIOPSY Right 2014   BREAST LUMPECTOMY Right 1997   BREAST LUMPECTOMY WITH AXILLARY LYMPH NODE DISSECTION Right 1997   CATARACT EXTRACTION, BILATERAL  2019   Dr. Kathlen Mody   CYSTOSCOPY WITH RETROGRADE PYELOGRAM, URETEROSCOPY AND STENT PLACEMENT Bilateral 06/17/2013   Procedure: CYSTOSCOPY WITH RETROGRADE PYELOGRAM, URETEROSCOPY AND LEFT DOUBLE  J STENT PLACEMENT RIGHT URETERAL HOLMIIUM LASER AND DOUBLE J STENT ;  Surgeon: Hanley Ben, MD;  Location: [facility];  Service: Urology;  Laterality: Bilateral;   HOLMIUM LASER APPLICATION Bilateral 24/58/0998   Procedure: HOLMIUM LASER APPLICATION;  Surgeon: Hanley Ben, MD;  Location: [facility];  Service: Urology;  Laterality: Bilateral;   LITHOTRIPSY Left 06/2013   LUMBAR EPIDURAL INJECTION     has had 7 injections   PORT-A-CATH REMOVAL Left 10/03/2014   Procedure: REMOVAL PORT-A-CATH;  Surgeon: Donnie Mesa, MD;  Location: [facility];  Service: General;  Laterality: Left;   PORTACATH PLACEMENT Left 07/28/2013   Procedure: ATTEMPTED INSERTION PORT-A-CATH;  Surgeon: Imogene Burn. Georgette Dover, MD;  Location: [facility];  Service: General;  Laterality: Left;   POSTERIOR LUMBAR FUSION 4 LEVEL N/A  04/25/2016   Procedure: LUMBAR FIVE-SACRAL ONE POSTERIOR LUMBAR INTERBODY FUSION, THORACIC NINE-SACRAL ONE POSTERIOR LATERAL ARTHRODESIS WITH PEDICLE SCREWS;  Surgeon: Earnie Larsson, MD;  Location: [facility];  Service: Neurosurgery;  Laterality: N/A;   RETINAL DETACHMENT SURGERY Right 2013   ROBOTIC ASSITED PARTIAL NEPHRECTOMY Right 02/05/2015   Procedure: ROBOTIC ASSITED PARTIAL NEPHRECTOMY;  Surgeon: Raynelle Bring, MD;  Location: [facility];  Service: Urology;  Laterality: Right;   SIMPLE MASTECTOMY WITH AXILLARY SENTINEL NODE BIOPSY Right 07/28/2013   Procedure: RIGHT TOTAL  MASTECTOMY;  Surgeon: Imogene Burn. 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 09/25/2020   Superficial thrombophlebitis 08/23/2018   Iron deficiency anemia 10/01/2017   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   Status post lumbar spine operative procedure for decompression of spinal cord 05/26/2016   Depression 05/05/2016   Degenerative spondylolisthesis 04/25/2016   Spasm of muscle, back 04/25/2016   Genetic testing 11/30/2015   Essential hypertension 10/18/2015   Morbid obesity ([place]) 10/18/2015   Upper airway cough syndrome 10/17/2015   Renal mass 02/05/2015   Anemia 10/25/2013   GERD (gastroesophageal reflux disease) 09/27/2013   CLL (chronic lymphocytic leukemia) ([place]) 07/27/2013   Malignant neoplasm of lower-outer quadrant of right breast of female, estrogen receptor positive ([place]) 07/26/2013   Nephrolithiasis 06/16/2013   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 ear 05/30/2013   Chronic pain syndrome 05/10/2013   DDD (degenerative disc disease) 05/10/2013   SOB (shortness of breath)    Sinus tachycardia    HLD (hyperlipidemia)    Orthostatic hypotension    Anxiety    Arthritis    Diverticulosis    Fibromyalgia    Hypothyroidism     PCP: Deland Pretty   REFERRING PROVIDER: Lynne Leader   REFERRING DIAG: Back pain  Rationale for Evaluation and Treatment Rehabilitation  THERAPY DIAG:  Other low back pain  Abnormal posture  ONSET DATE:   SUBJECTIVE:                                                                                                                                                                                            SUBJECTIVE STATEMENT:  Pt states continued pain with standing activity, notes less pain last night with standing, and less pain this AM with standing/making breakfast.    Eval: Pt states longstanding pain in back, worsening. Has fusion from T9 - L1 and L2-5.  States Pain in thoracic/lumbar and cervical spine. Most pain with standing/walking activity, IADLS, up to 21/19.  Also has hip soreness with walking. Does 30 min/day on bike.  Has stairs to basement, does ok with that.    PERTINENT HISTORY:  Fibromyalgia, Hip pain, Breast CA, neuropathy.  Has had 2 previous falls with injuries.   PAIN:  Are you having pain? Yes: NPRS scale: 9-10/10 Pain location: T-spine and c-spine Pain description: Achey Aggravating factors: standing activity, IADLS Relieving factors: sitting   PRECAUTIONS: None  WEIGHT BEARING RESTRICTIONS No  FALLS:  Has patient fallen in last 6 months? No   PLOF: Independent  PATIENT GOALS  decreased pain    OBJECTIVE:   DIAGNOSTIC FINDINGS:   COGNITION:  Overall cognitive status: Within functional limits for tasks assessed     POSTURE:   Seated: leaned back posture, ppt.   PALPATION: Pain in bil,  R>L  T and L spine, more pain laterally on R,  Tenderness in bil cervical paraspinals, mild pain in central c-spine with PA s .   LUMBAR ROM: significant limitations due to fusions LE ROM: Hips: mild limitation for rot and ext.  Cervical ROM:  mild limitation for ext and rotation bil;         LOWER EXTREMITY MMT:    Hips: 4-/5,  Knees: 4/5   LUMBAR SPECIAL TESTS:     TODAY'S TREATMENT   04/11/22: Therapeutic Exercise: Aerobic: Supine:  Glute sets x 10; Horiz abd RTB x 15;  Seated: Sit to stand x 10, practice for fwd lean.  Standing: Scap squeeze 10; Rows RTB x 15;  March x 20; mini quats x 20; Hip abd 2 x 10 bil;  Stretches: Shoulder flexion x 15  (for thoracic stretch);  Shoulder ER butterfly x 15;  SKTC 30 sec x 2 bil;  Seated HSS 30 sec x 3 bil;  Neuromuscular Re-education: Manual Therapy: Therapeutic Activity: Self Care: Discussion of seated and standing posture, posture with IADLs, and possible use of posture strap.    04/09/22: Therapeutic Exercise: Aerobic: Supine: shoulder horizontal abd x 15;   SLR x 10 bil; Glute sets x 10;  Seated:Sit to stand x 10, practice for fwd lean.  Standing: Scap squeeze 10; March x 20; mini quats x 20; Education and practice for bend/reach to floor x 5;  Stretches: Shoulder flexion x 15 (for thoracic stretch);  Shoulder ER butterfly x 15;  SKTC 30 sec x 4 bil;  Neuromuscular Re-education: Manual Therapy: Therapeutic Activity: Self Care:   04/03/22: Therapeutic Exercise: Aerobic: Supine: shoulder horizontal abd x 15;   SLR x 10 bil;  Seated:  Standing: Scap squeeze 10;   Stretches: Shoulder flexion with cane x 15 (for thoracic stretch);  Shoulder ER butterfly x 15;  SKTC 30 sec x 4 bil;  Neuromuscular Re-education: Manual Therapy: Therapeutic Activity: Self Care:    PATIENT EDUCATION:  Education details: PT POC, exam findings, HEP Person educated: Patient Education method: Explanation, Demonstration, Tactile cues, Verbal cues, and Handouts Education comprehension: verbalized understanding, returned demonstration, verbal cues required, tactile cues required, and needs further education   HOME EXERCISE PROGRAM: Access Code: [email or web address] URL: [email or web address] Date: 04/03/2022 Prepared by: Lyndee Hensen  Exercises - Single Knee to Chest Stretch  -  2 x daily - 3 reps - 30 hold - Supine Chest Stretch with Elbows Bent  - 1 x daily - 1 sets - 10 reps - Hooklying Shoulder T  - 1 x daily - 1 sets - 10 reps - Standing Scapular Retraction  - 1 x daily - 1- 2 sets - 5- 10 reps   ASSESSMENT:  CLINICAL IMPRESSION: Pt with good tolerance for activity today. Education  on seated posture and standing posture for IADLs, discussed possible posture strap.   Pt to benefit from continued/progressive strengthening and posture/decompression for pain.    OBJECTIVE IMPAIRMENTS Abnormal gait, decreased activity tolerance, decreased mobility, decreased ROM, decreased strength, increased muscle spasms, impaired flexibility, improper body mechanics, postural dysfunction, and pain.   ACTIVITY LIMITATIONS carrying, lifting, bending, standing, and squatting  PARTICIPATION LIMITATIONS: cleaning, laundry, shopping, and community activity  PERSONAL FACTORS Time since onset of injury/illness/exacerbation and excessive hardware/fusion  are also affecting patient's functional outcome.   REHAB POTENTIAL: Good  CLINICAL DECISION MAKING: Stable/uncomplicated  EVALUATION COMPLEXITY: Moderate   GOALS: Goals reviewed with patient? Yes  SHORT TERM GOALS: Target date: 04/25/2022  Pt to be independent with initial HEP  Goal status: INITIAL  2.  Pt to demo optimal seated posture   Goal status: INITIAL  3.  Pt to demo ability for sit to stand transfer with optimal mechanics, on first attempt.    Goal status: INITIAL   LONG TERM GOALS: Target date: 05/23/2022  Pt to be independent with final HEp  Goal status: INITIAL  2.  Pt to report decreased pain in back to 0-5/10 with standing and IADLS.   Goal status: INITIAL  3.  Pt to demo optimal mechanics for bend, squat, reach, lift for improved ability for IADLs .   Goal status: INITIAL  4.  Pt to demo improved strength of bil hips to at least 4+/5 to improve stability and pain.   Goal status: INITIAL    PLAN: PT FREQUENCY: 2x/week  PT DURATION: 6 weeks  PLANNED INTERVENTIONS: Therapeutic exercises, Therapeutic activity, Neuromuscular re-education, Balance training, Gait training, Patient/Family education, Self Care, and Joint mobilization.  PLAN FOR NEXT SESSION:    Lyndee Hensen, PT, DPT 1:14 PM   04/11/22

## 2022-04-15 ENCOUNTER — Ambulatory Visit: Payer: Medicare Other | Admitting: Physical Therapy

## 2022-04-15 ENCOUNTER — Encounter: Payer: Self-pay | Admitting: Physical Therapy

## 2022-04-15 DIAGNOSIS — M5459 Other low back pain: Secondary | ICD-10-CM

## 2022-04-15 DIAGNOSIS — R293 Abnormal posture: Secondary | ICD-10-CM

## 2022-04-15 NOTE — Therapy (Signed)
OUTPATIENT PHYSICAL THERAPY THORACOLUMBAR TREATMENT   Patient Name: [name] [name] MRN: [id number] DOB:May 01, 1946, 76 y.o., female Today's Date: 04/15/2022   PT End of Session - 04/15/22 1303     Visit Number 5    Number of Visits 12    Date for PT Re-Evaluation 05/05/22    Authorization Type BCBS    PT Start Time 1303    PT Stop Time 1343    PT Time Calculation (min) 40 min    Activity Tolerance Patient tolerated treatment well    Behavior During Therapy WFL for tasks assessed/performed              Past Medical History:  Diagnosis Date   Alopecia    Anxiety    Arthritis    "spine" (07/28/2013)   Breast cancer ([place]) 1997; 2014   right   CAP (community acquired pneumonia)    admission 06-04-2013, failed outpatient   Chronic back pain    "lower back and upper neck" (07/28/2013)   CLL (chronic lymphocytic leukemia) ([place]) 05/2013   DDD (degenerative disc disease)    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 ear    Depression    Diverticulosis    Elevated LFTs    Fibromyalgia 1978   GERD (gastroesophageal reflux disease)    Hematuria    History of syncope    episode 2008--  no recurrence since   HLD (hyperlipidemia)    Hypothyroidism    Kidney stones 2014   Plantar fasciitis    Ruptured disk    one in neck and two in back   S/P chemotherapy, time since greater than 12 weeks    Sinus tachycardia    mild resting   Skin cancer    nose   Stool incontinence    "at times recently"    Past Surgical History:  Procedure Laterality Date   ANTERIOR LATERAL LUMBAR FUSION 4 LEVELS Left 04/25/2016   Procedure: LEFT LUMBAR ONE-TWO, LUMBAR TWO-THREE, LUMBAR THREE-FOUR, LUMBAR FOUR-FIVE ANTERIOR LATERAL LUMBAR FUSION;  Surgeon: Earnie Larsson, MD;  Location: [facility];  Service: Neurosurgery;  Laterality: Left;   APPENDECTOMY  9242   APPLICATION OF ROBOTIC ASSISTANCE FOR SPINAL PROCEDURE N/A 04/06/2017   Procedure: APPLICATION OF ROBOTIC ASSISTANCE FOR SPINAL PROCEDURE;  Surgeon: Earnie Larsson, MD;   Location: [facility];  Service: Neurosurgery;  Laterality: N/A;   BREAST BIOPSY Right 2014   BREAST LUMPECTOMY Right 1997   BREAST LUMPECTOMY WITH AXILLARY LYMPH NODE DISSECTION Right 1997   CATARACT EXTRACTION, BILATERAL  2019   Dr. Kathlen Mody   CYSTOSCOPY WITH RETROGRADE PYELOGRAM, URETEROSCOPY AND STENT PLACEMENT Bilateral 06/17/2013   Procedure: CYSTOSCOPY WITH RETROGRADE PYELOGRAM, URETEROSCOPY AND LEFT DOUBLE  J STENT PLACEMENT RIGHT URETERAL HOLMIIUM LASER AND DOUBLE J STENT ;  Surgeon: Hanley Ben, MD;  Location: [facility];  Service: Urology;  Laterality: Bilateral;   HOLMIUM LASER APPLICATION Bilateral 68/34/1962   Procedure: HOLMIUM LASER APPLICATION;  Surgeon: Hanley Ben, MD;  Location: [facility];  Service: Urology;  Laterality: Bilateral;   LITHOTRIPSY Left 06/2013   LUMBAR EPIDURAL INJECTION     has had 7 injections   PORT-A-CATH REMOVAL Left 10/03/2014   Procedure: REMOVAL PORT-A-CATH;  Surgeon: Donnie Mesa, MD;  Location: [facility];  Service: General;  Laterality: Left;   PORTACATH PLACEMENT Left 07/28/2013   Procedure: ATTEMPTED INSERTION PORT-A-CATH;  Surgeon: Imogene Burn. Georgette Dover, MD;  Location: [facility];  Service: General;  Laterality: Left;   POSTERIOR LUMBAR FUSION 4 LEVEL N/A  04/25/2016   Procedure: LUMBAR FIVE-SACRAL ONE POSTERIOR LUMBAR INTERBODY FUSION, THORACIC NINE-SACRAL ONE POSTERIOR LATERAL ARTHRODESIS WITH PEDICLE SCREWS;  Surgeon: Earnie Larsson, MD;  Location: [facility];  Service: Neurosurgery;  Laterality: N/A;   RETINAL DETACHMENT SURGERY Right 2013   ROBOTIC ASSITED PARTIAL NEPHRECTOMY Right 02/05/2015   Procedure: ROBOTIC ASSITED PARTIAL NEPHRECTOMY;  Surgeon: Raynelle Bring, MD;  [facility];  Service: Urology;  Laterality: Right;   SIMPLE MASTECTOMY WITH AXILLARY SENTINEL NODE BIOPSY Right 07/28/2013   Procedure: RIGHT TOTAL  MASTECTOMY;  Surgeon: Imogene Burn. 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 09/25/2020   Superficial thrombophlebitis 08/23/2018   Iron deficiency anemia 10/01/2017   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   Status post lumbar spine operative procedure for decompression of spinal cord 05/26/2016   Depression 05/05/2016   Degenerative spondylolisthesis 04/25/2016   Spasm of muscle, back 04/25/2016   Genetic testing 11/30/2015   Essential hypertension 10/18/2015   Morbid obesity ([place]) 10/18/2015   Upper airway cough syndrome 10/17/2015   Renal mass 02/05/2015   Anemia 10/25/2013   GERD (gastroesophageal reflux disease) 09/27/2013   CLL (chronic lymphocytic leukemia) ([place]) 07/27/2013   Malignant neoplasm of lower-outer quadrant of right breast of female, estrogen receptor positive ([place]) 07/26/2013   Nephrolithiasis 06/16/2013   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 ear 05/30/2013   Chronic pain syndrome 05/10/2013   DDD (degenerative disc disease) 05/10/2013   SOB (shortness of breath)    Sinus tachycardia    HLD (hyperlipidemia)    Orthostatic hypotension    Anxiety    Arthritis    Diverticulosis    Fibromyalgia    Hypothyroidism     PCP: Deland Pretty   REFERRING PROVIDER: Lynne Leader   REFERRING DIAG: Back pain  Rationale for Evaluation and Treatment Rehabilitation  THERAPY DIAG:  Other low back pain  Abnormal posture  ONSET DATE:   SUBJECTIVE:                                                                                                                                                                                            SUBJECTIVE STATEMENT:  Pt states significant pain yesterday when watering flowers. Feels limited with activity, due to increased pain with most attempts for standing activity.    Eval: Pt states longstanding pain in back, worsening. Has fusion from T9 - L1 and L2-5.  States Pain in thoracic/lumbar and cervical spine. Most pain with standing/walking activity, IADLS, up to 82/42.  Also has hip soreness with walking. Does 30 min/day on bike.  Has stairs to basement, does ok with that.    PERTINENT HISTORY:  Fibromyalgia, Hip pain, Breast CA, neuropathy.  Has had 2 previous falls with injuries.   PAIN:  Are you having pain? Yes: NPRS scale: 9-10/10 Pain location: T-spine and c-spine Pain description: Achey Aggravating factors: standing activity, IADLS Relieving factors: sitting   PRECAUTIONS: None  WEIGHT BEARING RESTRICTIONS No  FALLS:  Has patient fallen in last 6 months? No   PLOF: Independent  PATIENT GOALS  decreased pain    OBJECTIVE:   DIAGNOSTIC FINDINGS:   COGNITION:  Overall cognitive status: Within functional limits for tasks assessed     POSTURE:   Seated: leaned back posture, ppt.   PALPATION: Pain in bil,  R>L  T and L spine, more pain laterally on R,  Tenderness in bil cervical paraspinals, mild pain in central c-spine with PA s .   LUMBAR ROM: significant limitations due to fusions LE ROM: Hips: mild limitation for rot and ext.  Cervical ROM:  mild limitation for ext and rotation bil;     LOWER EXTREMITY MMT:    Hips: 4-/5,  Knees: 4/5   LUMBAR SPECIAL TESTS:     TODAY'S TREATMENT   04/15/22: Therapeutic Exercise: Aerobic: Supine:  Horiz abd  x 15; Bridging x 12;  Seated: Sit to stand x 10, practice for fwd lean.  Standing: Scap squeeze 10; Rows RTB x 15;  Bil ER YTB 2 x10;  Shoulder flexion to tolerance 2x10; Shoulder abd to 90 deg 2x10  with practice for optimal posture and no pain in back;   Stretches: Shoulder flexion x 15 , 2lb(for thoracic stretch);  Shoulder ER butterfly x 15;  SKTC 30 sec x 2 bil;  supine decompression UE/LE stretch 5 sec x 10;  Neuromuscular Re-education: Manual Therapy: Self Care:     04/11/22: Therapeutic Exercise: Aerobic: Supine:  Glute sets x 10; Horiz abd RTB x 15;  Seated: Sit to stand x 10, practice for fwd lean.  Standing: Scap squeeze 10; Rows RTB x 15;  March x 20; mini quats x 20; Hip abd 2 x 10 bil;  Stretches: Shoulder flexion x 15 (for thoracic stretch);  Shoulder ER butterfly x 15;  SKTC 30 sec x 2 bil;  Seated HSS 30 sec x 3 bil;  Neuromuscular Re-education: Manual Therapy: Therapeutic Activity: Self Care: Discussion of seated and standing posture, posture with IADLs, and possible use of posture strap.    04/09/22: Therapeutic Exercise: Aerobic: Supine: shoulder horizontal abd x 15;   SLR x 10 bil; Glute sets x 10;  Seated:Sit to stand x 10, practice for fwd lean.  Standing: Scap squeeze 10; March x 20; mini quats x 20; Education and practice for bend/reach to floor x 5;  Stretches: Shoulder flexion x 15 (for thoracic stretch);  Shoulder ER butterfly x 15;  SKTC 30 sec x 4 bil;  Neuromuscular Re-education: Manual Therapy: Therapeutic Activity: Self Care:   PATIENT EDUCATION:  Education details: PT POC, exam findings, HEP Person educated: Patient Education method: Explanation, Demonstration, Tactile cues, Verbal cues, and Handouts  Education comprehension: verbalized understanding, returned demonstration, verbal cues required, tactile cues required, and needs further education   HOME EXERCISE PROGRAM: Access Code: [email or web address] URL: [email or web address] Date: 04/03/2022 Prepared by: Lyndee Hensen  Exercises - Single Knee to Chest Stretch  - 2 x daily - 3 reps - 30 hold - Supine Chest Stretch with Elbows Bent  - 1 x daily - 1 sets - 10 reps - Hooklying  Shoulder T  - 1 x daily - 1 sets - 10 reps - Standing Scapular Retraction  - 1 x daily - 1- 2 sets - 5- 10 reps - supine decompression 5 sec x 10;   ASSESSMENT:  CLINICAL IMPRESSION: Pt with good tolerance for exercises. Focus on standing posture/position with UE ROM. Trial for altering posture slightly to improve pain with UE movement. Pt with increased pain when trying to stand too upright. She has improved ability for UE ROM and movement in supine/un-weighted position. Will continue to practice in standing/weighted position.   OBJECTIVE IMPAIRMENTS Abnormal gait, decreased activity tolerance, decreased mobility, decreased ROM, decreased strength, increased muscle spasms, impaired flexibility, improper body mechanics, postural dysfunction, and pain.   ACTIVITY LIMITATIONS carrying, lifting, bending, standing, and squatting  PARTICIPATION LIMITATIONS: cleaning, laundry, shopping, and community activity  PERSONAL FACTORS Time since onset of injury/illness/exacerbation and excessive hardware/fusion  are also affecting patient's functional outcome.   REHAB POTENTIAL: Good  CLINICAL DECISION MAKING: Stable/uncomplicated  EVALUATION COMPLEXITY: Moderate   GOALS: Goals reviewed with patient? Yes  SHORT TERM GOALS: Target date: 04/29/2022  Pt to be independent with initial HEP  Goal status: INITIAL  2.  Pt to demo optimal seated posture   Goal status: INITIAL  3.  Pt to demo ability for sit to stand transfer with optimal mechanics, on first attempt.    Goal status: INITIAL   LONG TERM GOALS: Target date: 05/27/2022  Pt to be independent with final HEp  Goal status: INITIAL  2.  Pt to report decreased pain in back to 0-5/10 with standing and IADLS.   Goal status: INITIAL  3.  Pt to demo optimal mechanics for bend, squat, reach, lift for improved ability for IADLs .   Goal status: INITIAL  4.  Pt to demo improved strength of bil hips to at least 4+/5 to improve stability  and pain.   Goal status: INITIAL    PLAN: PT FREQUENCY: 2x/week  PT DURATION: 6 weeks  PLANNED INTERVENTIONS: Therapeutic exercises, Therapeutic activity, Neuromuscular re-education, Balance training, Gait training, Patient/Family education, Self Care, and Joint mobilization.  PLAN FOR NEXT SESSION:    Lyndee Hensen, PT, DPT 1:50 PM  04/15/22

## 2022-04-16 ENCOUNTER — Ambulatory Visit (INDEPENDENT_AMBULATORY_CARE_PROVIDER_SITE_OTHER): Payer: Medicare Other | Admitting: Family Medicine

## 2022-04-16 VITALS — BP 136/84 | HR 74 | Ht 63.0 in | Wt 162.6 lb

## 2022-04-16 DIAGNOSIS — G8929 Other chronic pain: Secondary | ICD-10-CM

## 2022-04-16 DIAGNOSIS — M546 Pain in thoracic spine: Secondary | ICD-10-CM

## 2022-04-16 NOTE — Patient Instructions (Addendum)
Thank you for coming in today.    I recommend the following book for self guided CBT for pain management.  This is one of our better evidence based treatments for chronic pain.   [email or web address].[email or web address].com/ The Pain Management Workbook: Powerful CBT and Mindfulness Skills to Take Control of Pain and Reclaim Your Life by Charlaine Dalton MS PhD Artist),  Ask [facility] people about Cognitive Behavioral Therapy for Chronic Pain  All the other reasonable options for pain management for you have been done.  I am not very optimistic about back injection.

## 2022-04-16 NOTE — Progress Notes (Signed)
I, Peterson Lombard, LAT, ATC acting as a scribe for Lynne Leader, MD.  [name] [name] is a 76 y.o. female who presents to [place] at [facility] today for f/u thoracic back and neck pain. Of note, pt is currently being treated by Oncology for CLL and breast cancer. Pt has been seen by Dr. Annette Stable at [place] w/ hx of thoracolumbar fusion surgery in 2017. Pt is also a pt at the [facility] and reports having prior "tramadol and kenalog" injections that did not provide any relief. Pt was last seen by Dr. Georgina Snell on 03/12/22 and was referred to PT, completing 5 visits. Pt was last seen by [facility] Dr. Ranell Patrick on 8/14. Today, pt reports pain has cont'd in her upper back and neck. Pain is about the same. Pt notes no benefit from the meloxicam.   Dx imaging: 12/19/21 T-spine & L-spine CT             09/06/20 T-spine & c-spine MRI             05/11/18 T-spine & L-spine CT             04/06/17 L-spine XR             04/25/16 Thoracolumbar XR             02/13/16 L-spine MRI & L-spine XR  Pertinent review of systems: No fevers or chills  Relevant historical information: Hypothyroidism.  Extensive spinal surgery.   Exam:  BP 136/84   Pulse 74   Ht '5\' 3"'$  (1.6 m)   Wt 162 lb 9.6 oz (73.8 kg)   LMP 08/26/1995   SpO2 98%   BMI 28.80 kg/m  General: Well Developed, well nourished, and in no acute distress.   MSK: T-spine: Mildly tender palpation paraspinal musculature.  Decreased thoracic motion.    Lab and Radiology Results  CT scan T-spine images obtained on December 19, 2021 personally and independently interpreted. Extensive posterior spinal fusion up to the level of T9.  At T7-T8 facet degenerative changes are present.    Assessment and Plan: 76 y.o. female with chronic thoracic back pain.  Unfortunately she is failing typical conservative management including physical therapy most recently.  She already has excellent medical management  including Cymbalta, amitriptyline, gabapentin, meloxicam.  I do not think adjusting her medications is going to be very beneficial.  Adding opiates as probably going to cause more harm than good here.  The next evidence-based option is cognitive behavioral therapy.  She does have an established relationship with a [facility] office and therefore has should have an in for licensed therapy probably offers CBT for chronic pain management.  However while she is trying to get that started I do recommend Dr Fidela Salisbury Pain Management Handbook which is self-guided CBT.  This could be helpful for her.  Spent a fair amount of time talking about the evidence and rationale for cognitive behavioral therapy for management of chronic pain and how it could be helpful for her in particular.  Unfortunately I do not have much else to offer her. She could theoretically have a trial of facet injections at the thoracic facets although these are extremely challenging and most PMNR doctors will not do them.  I am not optimistic that they would be helpful.  Recheck back with me as needed.   Total encounter time 30 minutes including face-to-face time with the patient and, reviewing past medical record,  and charting on the date of service.     Discussed warning signs or symptoms. Please see discharge instructions. Patient expresses understanding.   The above documentation has been reviewed and is accurate and complete Lynne Leader, M.D.

## 2022-04-17 ENCOUNTER — Encounter: Payer: Self-pay | Admitting: Physical Therapy

## 2022-04-17 ENCOUNTER — Ambulatory Visit: Payer: Medicare Other | Admitting: Physical Therapy

## 2022-04-17 ENCOUNTER — Ambulatory Visit: Payer: Medicare Other | Admitting: Physical Medicine and Rehabilitation

## 2022-04-17 DIAGNOSIS — R293 Abnormal posture: Secondary | ICD-10-CM | POA: Diagnosis not present

## 2022-04-17 DIAGNOSIS — M5459 Other low back pain: Secondary | ICD-10-CM | POA: Diagnosis not present

## 2022-04-17 NOTE — Therapy (Signed)
OUTPATIENT PHYSICAL THERAPY THORACOLUMBAR TREATMENT   Patient Name: [name] [name] MRN: [id number] DOB:1946-06-22, 76 y.o., female Today's Date: 04/17/2022   PT End of Session - 04/17/22 1301     Visit Number 6    Number of Visits 12    Date for PT Re-Evaluation 05/05/22    Authorization Type BCBS    PT Start Time 1303    PT Stop Time 7035    PT Time Calculation (min) 42 min    Activity Tolerance Patient tolerated treatment well    Behavior During Therapy WFL for tasks assessed/performed              Past Medical History:  Diagnosis Date   Alopecia    Anxiety    Arthritis    "spine" (07/28/2013)   Breast cancer ([place]) 1997; 2014   right   CAP (community acquired pneumonia)    admission 06-04-2013, failed outpatient   Chronic back pain    "lower back and upper neck" (07/28/2013)   CLL (chronic lymphocytic leukemia) ([place]) 05/2013   DDD (degenerative disc disease)    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 ear    Depression    Diverticulosis    Elevated LFTs    Fibromyalgia 1978   GERD (gastroesophageal reflux disease)    Hematuria    History of syncope    episode 2008--  no recurrence since   HLD (hyperlipidemia)    Hypothyroidism    Kidney stones 2014   Plantar fasciitis    Ruptured disk    one in neck and two in back   S/P chemotherapy, time since greater than 12 weeks    Sinus tachycardia    mild resting   Skin cancer    nose   Stool incontinence    "at times recently"    Past Surgical History:  Procedure Laterality Date   ANTERIOR LATERAL LUMBAR FUSION 4 LEVELS Left 04/25/2016   Procedure: LEFT LUMBAR ONE-TWO, LUMBAR TWO-THREE, LUMBAR THREE-FOUR, LUMBAR FOUR-FIVE ANTERIOR LATERAL LUMBAR FUSION;  Surgeon: Earnie Larsson, MD;  Location: [place] [facility];  Service: Neurosurgery;  Laterality: Left;   APPENDECTOMY  0093   APPLICATION OF ROBOTIC ASSISTANCE FOR SPINAL PROCEDURE N/A 04/06/2017   Procedure: APPLICATION OF ROBOTIC ASSISTANCE FOR SPINAL PROCEDURE;  Surgeon: Earnie Larsson, MD;   Location: [place];  Service: Neurosurgery;  Laterality: N/A;   BREAST BIOPSY Right 2014   BREAST LUMPECTOMY Right 1997   BREAST LUMPECTOMY WITH AXILLARY LYMPH NODE DISSECTION Right 1997   CATARACT EXTRACTION, BILATERAL  2019   Dr. Kathlen Mody   CYSTOSCOPY WITH RETROGRADE PYELOGRAM, URETEROSCOPY AND STENT PLACEMENT Bilateral 06/17/2013   Procedure: CYSTOSCOPY WITH RETROGRADE PYELOGRAM, URETEROSCOPY AND LEFT DOUBLE  J STENT PLACEMENT RIGHT URETERAL HOLMIIUM LASER AND DOUBLE J STENT ;  Surgeon: Hanley Ben, MD;  Location: [place];  Service: Urology;  Laterality: Bilateral;   HOLMIUM LASER APPLICATION Bilateral 81/82/9937   Procedure: HOLMIUM LASER APPLICATION;  Surgeon: Hanley Ben, MD;  Location: [place];  Service: Urology;  Laterality: Bilateral;   LITHOTRIPSY Left 06/2013   LUMBAR EPIDURAL INJECTION     has had 7 injections   PORT-A-CATH REMOVAL Left 10/03/2014   Procedure: REMOVAL PORT-A-CATH;  Surgeon: Donnie Mesa, MD;  Location: [place];  Service: General;  Laterality: Left;   PORTACATH PLACEMENT Left 07/28/2013   Procedure: ATTEMPTED INSERTION PORT-A-CATH;  Surgeon: Imogene Burn. Georgette Dover, MD;  Location: [place];  Service: General;  Laterality: Left;   POSTERIOR LUMBAR FUSION 4 LEVEL N/A  04/25/2016   Procedure: LUMBAR FIVE-SACRAL ONE POSTERIOR LUMBAR INTERBODY FUSION, THORACIC NINE-SACRAL ONE POSTERIOR LATERAL ARTHRODESIS WITH PEDICLE SCREWS;  Surgeon: Earnie Larsson, MD;  Location: [place] [facility];  Service: Neurosurgery;  Laterality: N/A;   RETINAL DETACHMENT SURGERY Right 2013   ROBOTIC ASSITED PARTIAL NEPHRECTOMY Right 02/05/2015   Procedure: ROBOTIC ASSITED PARTIAL NEPHRECTOMY;  Surgeon: Raynelle Bring, MD;  Location: [facility];  Service: Urology;  Laterality: Right;   SIMPLE MASTECTOMY WITH AXILLARY SENTINEL NODE BIOPSY Right 07/28/2013   Procedure: RIGHT TOTAL  MASTECTOMY;  Surgeon: Imogene Burn. 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 09/25/2020   Superficial thrombophlebitis 08/23/2018   Iron deficiency anemia 10/01/2017   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   Status post lumbar spine operative procedure for decompression of spinal cord 05/26/2016   Depression 05/05/2016   Degenerative spondylolisthesis 04/25/2016   Spasm of muscle, back 04/25/2016   Genetic testing 11/30/2015   Essential hypertension 10/18/2015   Morbid obesity ([place]) 10/18/2015   Upper airway cough syndrome 10/17/2015   Renal mass 02/05/2015   Anemia 10/25/2013   GERD (gastroesophageal reflux disease) 09/27/2013   CLL (chronic lymphocytic leukemia) ([place]) 07/27/2013   Malignant neoplasm of lower-outer quadrant of right breast of female, estrogen receptor positive ([place]) 07/26/2013   Nephrolithiasis 06/16/2013   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 ear 05/30/2013   Chronic pain syndrome 05/10/2013   DDD (degenerative disc disease) 05/10/2013   SOB (shortness of breath)    Sinus tachycardia    HLD (hyperlipidemia)    Orthostatic hypotension    Anxiety    Arthritis    Diverticulosis    Fibromyalgia    Hypothyroidism     PCP: Deland Pretty   REFERRING PROVIDER: Lynne Leader   REFERRING DIAG: Back pain  Rationale for Evaluation and Treatment Rehabilitation  THERAPY DIAG:  Other low back pain  Abnormal posture  ONSET DATE:   SUBJECTIVE:                                                                                                                                                                                            SUBJECTIVE STATEMENT:  Pt states significant pain yesterday when watering flowers. Feels limited with activity, due to increased pain with most attempts for standing activity.    Eval: Pt states longstanding pain in back, worsening. Has fusion from T9 - L1 and L2-5.  States Pain in thoracic/lumbar and cervical spine. Most pain with standing/walking activity, IADLS, up to 74/94.  Also has hip soreness with walking. Does 30 min/day on bike.  Has stairs to basement, does ok with that.    PERTINENT HISTORY:  Fibromyalgia, Hip pain, Breast CA, neuropathy.  Has had 2 previous falls with injuries.   PAIN:  Are you having pain? Yes: NPRS scale: 9-10/10 Pain location: T-spine and c-spine Pain description: [place] Aggravating factors: standing activity, IADLS Relieving factors: sitting   PRECAUTIONS: None  WEIGHT BEARING RESTRICTIONS No  FALLS:  Has patient fallen in last 6 months? No   PLOF: Independent  PATIENT GOALS  decreased pain    OBJECTIVE:   DIAGNOSTIC FINDINGS:   COGNITION:  Overall cognitive status: Within functional limits for tasks assessed     POSTURE:   Seated: leaned back posture, ppt.   PALPATION: Pain in bil,  R>L  T and L spine, more pain laterally on R,  Tenderness in bil cervical paraspinals, mild pain in central c-spine with PA s .   LUMBAR ROM: significant limitations due to fusions LE ROM: Hips: mild limitation for rot and ext.  Cervical ROM:  mild limitation for ext and rotation bil;     LOWER EXTREMITY MMT:    Hips: 4-/5,  Knees: 4/5   LUMBAR SPECIAL TESTS:     TODAY'S TREATMENT   04/17/22: Therapeutic Exercise: Aerobic: Supine:   Bridging x 12;  Seated: Sit to stand 2 x 5, practice for fwd lean.  Standing:  Rows RTB x 15;  Bil ER RTB 2 x10;  Shoulder flexion to tolerance 2x10; Shoulder abd to 90 deg 2x10 with practice for optimal posture  and no pain in back;  Fwd reach withrotation x 10 bil;  Stretches:   SKTC 30 sec x 2 bil;  supine decompression UE/LE stretch 5 sec x 10 bil; ;  Neuromuscular Re-education: Manual Therapy: Self Care:    04/15/22: Therapeutic Exercise: Aerobic: Supine:  Horiz abd  x 15; Bridging x 12;  Seated: Sit to stand x 10, practice for fwd lean.  Standing: Scap squeeze 10; Rows RTB x 15;  Bil ER YTB 2 x10;  Shoulder flexion to tolerance 2x10; Shoulder abd to 90 deg 2x10 with practice for optimal posture and no pain in back;   Stretches: Shoulder flexion x 15 , 2lb(for thoracic stretch);  Shoulder ER butterfly x 15;  SKTC 30 sec x 2 bil;  supine decompression UE/LE stretch 5 sec x 10;  Neuromuscular Re-education: Manual Therapy: Self Care:     04/11/22: Therapeutic Exercise: Aerobic: Supine:  Glute sets x 10; Horiz abd RTB x 15;  Seated: Sit to stand x 10, practice for fwd lean.  Standing: Scap squeeze 10; Rows RTB x 15;  March x 20; mini quats x 20; Hip abd 2 x 10 bil;  Stretches: Shoulder flexion x 15 (for thoracic stretch);  Shoulder ER butterfly x 15;  SKTC 30 sec x 2 bil;  Seated HSS 30 sec x 3 bil;  Neuromuscular Re-education: Manual Therapy: Therapeutic Activity: Self Care: Discussion of seated and standing posture, posture with IADLs, and possible use of posture strap.    PATIENT EDUCATION:  Education details: reviewed HEP,  Person educated: Patient Education  method: Explanation, Demonstration, Tactile cues, Verbal cues, and Handouts Education comprehension: verbalized understanding, returned demonstration, verbal cues required, tactile cues required, and needs further education   HOME EXERCISE PROGRAM: Access Code: [email or web address]  ASSESSMENT:  CLINICAL IMPRESSION: Pt with good ability for exercises. Pain today only with longer duration standing . She is still most limited for standing with activities at home.  Plan to progress posture, mobility and strength as tolerated.    OBJECTIVE IMPAIRMENTS Abnormal gait, decreased activity tolerance, decreased mobility, decreased ROM, decreased strength, increased muscle spasms, impaired flexibility, improper body mechanics, postural dysfunction, and pain.   ACTIVITY LIMITATIONS carrying, lifting, bending, standing, and squatting  PARTICIPATION LIMITATIONS: cleaning, laundry, shopping, and community activity  PERSONAL FACTORS Time since onset of injury/illness/exacerbation and excessive hardware/fusion  are also affecting patient's functional outcome.   REHAB POTENTIAL: Good  CLINICAL DECISION MAKING: Stable/uncomplicated  EVALUATION COMPLEXITY: Moderate   GOALS: Goals reviewed with patient? Yes  SHORT TERM GOALS: Target date: 04/07/2022  Pt to be independent with initial HEP  Goal status: INITIAL  2.  Pt to demo optimal seated posture   Goal status: INITIAL  3.  Pt to demo ability for sit to stand transfer with optimal mechanics, on first attempt.    Goal status: INITIAL   LONG TERM GOALS: Target date: 05/05/2022  Pt to be independent with final HEp  Goal status: INITIAL  2.  Pt to report decreased pain in back to 0-5/10 with standing and IADLS.   Goal status: INITIAL  3.  Pt to demo optimal mechanics for bend, squat, reach, lift for improved ability for IADLs .   Goal status: INITIAL  4.  Pt to demo improved strength of bil hips to at least 4+/5 to improve stability and pain.   Goal status: INITIAL    PLAN: PT FREQUENCY: 2x/week  PT DURATION: 6 weeks  PLANNED INTERVENTIONS: Therapeutic exercises, Therapeutic activity, Neuromuscular re-education, Balance training, Gait training, Patient/Family education, Self Care, and Joint mobilization.  PLAN FOR NEXT SESSION:    Lyndee Hensen, PT, DPT 1:02 PM  04/17/22

## 2022-04-18 ENCOUNTER — Inpatient Hospital Stay: Payer: Medicare Other

## 2022-04-18 ENCOUNTER — Other Ambulatory Visit: Payer: Medicare Other

## 2022-04-18 ENCOUNTER — Ambulatory Visit: Payer: Medicare Other | Admitting: Hematology and Oncology

## 2022-04-18 ENCOUNTER — Other Ambulatory Visit: Payer: Self-pay

## 2022-04-18 ENCOUNTER — Inpatient Hospital Stay: Payer: Medicare Other | Admitting: Hematology and Oncology

## 2022-04-18 VITALS — BP 120/69 | HR 84 | Temp 97.7°F | Resp 18 | Ht 63.0 in | Wt 164.9 lb

## 2022-04-18 DIAGNOSIS — C9110 Chronic lymphocytic leukemia of B-cell type not having achieved remission: Secondary | ICD-10-CM | POA: Diagnosis not present

## 2022-04-18 DIAGNOSIS — C50511 Malignant neoplasm of lower-outer quadrant of right female breast: Secondary | ICD-10-CM

## 2022-04-18 DIAGNOSIS — Z170 Estrogen receptor positive status [ER+]: Secondary | ICD-10-CM | POA: Diagnosis not present

## 2022-04-18 DIAGNOSIS — Z853 Personal history of malignant neoplasm of breast: Secondary | ICD-10-CM | POA: Diagnosis not present

## 2022-04-18 DIAGNOSIS — Z79899 Other long term (current) drug therapy: Secondary | ICD-10-CM | POA: Diagnosis not present

## 2022-04-18 DIAGNOSIS — Z9221 Personal history of antineoplastic chemotherapy: Secondary | ICD-10-CM | POA: Diagnosis not present

## 2022-04-18 DIAGNOSIS — Z9011 Acquired absence of right breast and nipple: Secondary | ICD-10-CM | POA: Diagnosis not present

## 2022-04-18 NOTE — Progress Notes (Signed)
[facility] [phone]) [phone]   [phone]  PROGRESS NOTE  Patient Care Team: Deland Pretty, MD as PCP - General (Internal Medicine) Jerline Pain, MD as PCP - Cardiology (Cardiology) Milus Banister, MD as Attending Physician (Gastroenterology) Megan Salon, MD as Consulting Physician (Gynecology) Donnie Mesa, MD as Consulting Physician (General Surgery) Noemi Chapel, NP as Nurse Practitioner Rod Can, MD as Consulting Physician (Orthopedic Surgery) Latanya Maudlin, MD as Consulting Physician (Orthopedic Surgery) Ria Clock, MD as Attending Physician (Radiology) Adele Dan, MD as Referring Physician (Orthopedic Surgery) Orson Slick, MD as Consulting Physician (Hematology and Oncology)  Hematological/Oncological History # CLL Rai Stage 1  06/04/2021: last visit with Dr. Jana Hakim. Detailed history of his care history noted below.  09/18/2021: WBC 43.3, Hgb 13.0, MCV 87.1, Plt 275 09/23/2021: establish care with Dr. Lorenso Courier. WBC 30.1, Hgb 12.3, MCV 87.8, Plt 241 03/11/2022: WBC 57.4, Hgb 11.2, MCV 88.3, Plt 181   Interval History:  [name] [name] 76 y.o. 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 The patient's last visit was on 03/11/2022. In the interim since the last visit she was noted to have a WBC of 66.7 on 04/09/2022.   On exam today [name] [name] is accompanied by her daughter.  She reports she has been in physical therapy for her back and has been doing well at this.  She reports that she is prescribing meloxicam for her pain but it is "not helping".  She notes that when she has activity she has to take a break and does "a lot of sitting".  She notes otherwise she is not having any B symptoms.  She is not having any issues with fevers, chills, sweats, nausea, vomiting or diarrhea.  Denies any bulky lymphadenopathy.  A full 10 point ROS is listed  below.  Today we discussed the rapid doubling time of her white blood cell count and the need to start therapy.  She noted that she would like to start therapy at this time and is agreeable to pursuing acalabrutinib 100 mg twice daily.   MEDICAL HISTORY:  Past Medical History:  Diagnosis Date   Alopecia    Anxiety    Arthritis    "spine" (07/28/2013)   Breast cancer ([place]) 1997; 2014   right   CAP (community acquired pneumonia)    admission 06-04-2013, failed outpatient   Chronic back pain    "lower back and upper neck" (07/28/2013)   CLL (chronic lymphocytic leukemia) ([place]) 05/2013   DDD (degenerative disc disease)    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 ear    Depression    Diverticulosis    Elevated LFTs    Fibromyalgia 1978   GERD (gastroesophageal reflux disease)    Hematuria    History of syncope    episode 2008--  no recurrence since   HLD (hyperlipidemia)    Hypothyroidism    Kidney stones 2014   Plantar fasciitis    Ruptured disk    one in neck and two in back   S/P chemotherapy, time since greater than 12 weeks    Sinus tachycardia    mild resting   Skin cancer    nose   Stool incontinence    "at times recently"     SURGICAL HISTORY: Past Surgical History:  Procedure Laterality Date   ANTERIOR LATERAL LUMBAR FUSION 4 LEVELS Left 04/25/2016   Procedure: LEFT LUMBAR ONE-TWO, LUMBAR TWO-THREE,  LUMBAR THREE-FOUR, LUMBAR FOUR-FIVE ANTERIOR LATERAL LUMBAR FUSION;  Surgeon: Earnie Larsson, MD;  Location: [facility];  Service: Neurosurgery;  Laterality: Left;   APPENDECTOMY  [id number]   APPLICATION OF ROBOTIC ASSISTANCE FOR SPINAL PROCEDURE N/A 04/06/2017   Procedure: APPLICATION OF ROBOTIC ASSISTANCE FOR SPINAL PROCEDURE;  Surgeon: Earnie Larsson, MD;  Location: [facility];  Service: Neurosurgery;  Laterality: N/A;   BREAST BIOPSY Right 2014   BREAST LUMPECTOMY Right 1997   BREAST LUMPECTOMY WITH AXILLARY LYMPH NODE DISSECTION Right 1997   CATARACT EXTRACTION, BILATERAL  2019   Dr. Kathlen Mody    CYSTOSCOPY WITH RETROGRADE PYELOGRAM, URETEROSCOPY AND STENT PLACEMENT Bilateral 06/17/2013   Procedure: CYSTOSCOPY WITH RETROGRADE PYELOGRAM, URETEROSCOPY AND LEFT DOUBLE  J STENT PLACEMENT RIGHT URETERAL HOLMIIUM LASER AND DOUBLE J STENT ;  Surgeon: Hanley Ben, MD;  Location: [facility];  Service: Urology;  Laterality: Bilateral;   HOLMIUM LASER APPLICATION Bilateral 62/83/1517   Procedure: HOLMIUM LASER APPLICATION;  Surgeon: Hanley Ben, MD;  Location: [facility];  Service: Urology;  Laterality: Bilateral;   LITHOTRIPSY Left 06/2013   LUMBAR EPIDURAL INJECTION     has had 7 injections   PORT-A-CATH REMOVAL Left 10/03/2014   Procedure: REMOVAL PORT-A-CATH;  Surgeon: Donnie Mesa, MD;  Location: [facility];  Service: General;  Laterality: Left;   PORTACATH PLACEMENT Left 07/28/2013   Procedure: ATTEMPTED INSERTION PORT-A-CATH;  Surgeon: Imogene Burn. Georgette Dover, MD;  Location: [facility];  Service: General;  Laterality: Left;   POSTERIOR LUMBAR FUSION 4 LEVEL N/A 04/25/2016   Procedure: LUMBAR FIVE-SACRAL ONE POSTERIOR LUMBAR INTERBODY FUSION, THORACIC NINE-SACRAL ONE POSTERIOR LATERAL ARTHRODESIS WITH PEDICLE SCREWS;  Surgeon: Earnie Larsson, MD;  Location: [facility];  Service: Neurosurgery;  Laterality: N/A;   RETINAL DETACHMENT SURGERY Right 2013   ROBOTIC ASSITED PARTIAL NEPHRECTOMY Right 02/05/2015   Procedure: ROBOTIC ASSITED PARTIAL NEPHRECTOMY;  Surgeon: Raynelle Bring, MD;  Location: [facility];  Service: Urology;  Laterality: Right;   SIMPLE MASTECTOMY WITH AXILLARY SENTINEL NODE BIOPSY Right 07/28/2013   Procedure: RIGHT TOTAL  MASTECTOMY;  Surgeon: Imogene Burn. Georgette Dover, MD;  Location: [facility];  Service: General;  Laterality: Right;   TONSILLECTOMY  AGE 41   TOTAL ABDOMINAL HYSTERECTOMY W/ BILATERAL SALPINGOOPHORECTOMY  1997    SOCIAL HISTORY: Social History   Socioeconomic History   Marital status: Married    Spouse name: Philip   Number of children:  1   Years of education: Not on file   Highest education level: Not on file  Occupational History   Occupation: homemaker  Tobacco Use   Smoking status: Never   Smokeless tobacco: Never  Vaping Use   Vaping Use: Never used  Substance and Sexual Activity   Alcohol use: No   Drug use: No   Sexual activity: Not Currently    Partners: Male    Birth control/protection: Surgical    Comment: TAH/BSO  Other Topics Concern   Not on file  Social History Narrative   Not on file   Social Determinants of Health   Financial Resource Strain: Not on file  Food Insecurity: Not on file  Transportation Needs: Not on file  Physical Activity: Not on file  Stress: Not on file  Social Connections: Not on file  Intimate Partner Violence: Not on file    FAMILY HISTORY: Family History  Problem Relation Age of Onset   Heart disease Maternal Grandfather    Diabetes Maternal Grandfather    Colon cancer Maternal Aunt 22  Brain cancer Paternal Grandmother        dx in 56s   Dementia Mother    Diabetes Mother    Osteoporosis Mother    Diabetes Maternal Aunt    Prostate cancer Father 50   Bipolar disorder Maternal Aunt    Stroke Maternal Aunt    Stomach cancer Paternal Uncle        dx in late 66s    ALLERGIES:  is allergic to lasix [furosemide], lithium, sulfa antibiotics, trazodone and nefazodone, and lyrica [pregabalin].  MEDICATIONS:  Current Outpatient Medications  Medication Sig Dispense Refill   acalabrutinib (CALQUENCE) 100 MG capsule Take 1 capsule (100 mg total) by mouth 2 (two) times daily. 60 capsule 1   allopurinol (ZYLOPRIM) 300 MG tablet TAKE 1 TABLET(300 MG) BY MOUTH DAILY 90 tablet 4   amitriptyline (ELAVIL) 100 MG tablet Take 100 mg by mouth at bedtime.     Apoaequorin (PREVAGEN PO) Take by mouth daily.     aspirin 81 MG EC tablet Take 1 tablet by mouth daily.     brexpiprazole (REXULTI) 2 MG TABS tablet 1 tablet     buPROPion (WELLBUTRIN XL) 300 MG 24 hr tablet Take  300 mg by mouth daily.     CALCIUM CITRATE PO Take 1 tablet by mouth daily.     Cholecalciferol (VITAMIN D3) 50 MCG (2000 UT) TABS Take 1 tablet by mouth daily.     Cyanocobalamin (VITAMIN B12) 1000 MCG TBCR Take 1 tablet by mouth daily.     DULoxetine (CYMBALTA) 60 MG capsule Take 60 mg by mouth daily.     gabapentin (NEURONTIN) 100 MG capsule Take 100 mg by mouth at bedtime.     GARLIC PO Take 1 tablet by mouth daily.     levothyroxine (SYNTHROID, LEVOTHROID) 88 MCG tablet Take 88 mcg by mouth daily before breakfast.  4   Meloxicam 15 MG TBDP Take 1 tablet by mouth daily as needed. 30 tablet 3   Multiple Vitamins-Minerals (WOMENS 50+ MULTI VITAMIN/MIN) TABS Take 1 tablet by mouth daily.     omeprazole (PRILOSEC) 40 MG capsule TAKE 1 CAPSULE(40 MG) BY MOUTH DAILY AFTER BREAKFAST 90 capsule 0   rosuvastatin (CRESTOR) 5 MG tablet TAKE 1 TABLET(5 MG) BY MOUTH DAILY 90 tablet 2   zinc gluconate 50 MG tablet Take 50 mg by mouth daily.     No current facility-administered medications for this visit.    REVIEW OF SYSTEMS:   Constitutional: ( - ) fevers, ( - )  chills , ( - ) night sweats Eyes: ( - ) blurriness of vision, ( - ) double vision, ( - ) watery eyes Ears, nose, mouth, throat, and face: ( - ) mucositis, ( - ) sore throat Respiratory: ( - ) cough, ( - ) dyspnea, ( - ) wheezes Cardiovascular: ( - ) palpitation, ( - ) chest discomfort, ( - ) lower extremity swelling Gastrointestinal:  ( - ) nausea, ( - ) heartburn, ( - ) change in bowel habits Skin: ( - ) abnormal skin rashes Lymphatics: ( - ) new lymphadenopathy, ( - ) easy bruising Neurological: ( - ) numbness, ( - ) tingling, ( - ) new weaknesses Behavioral/Psych: ( - ) mood change, ( - ) new changes  All other systems were reviewed with the patient and are negative.  PHYSICAL EXAMINATION:  Vitals:   04/18/22 0939  BP: 120/69  Pulse: 84  Resp: 18  Temp: 97.7 F (36.5 C)  SpO2: 98%  Filed Weights   04/18/22 0939  Weight:  164 lb 14.4 oz (74.8 kg)    GENERAL: well appearing elderly Caucasian female. alert, no distress and comfortable SKIN: mild erythema on left upper arm. skin color, texture, turgor are normal, no rashes or significant lesions EYES: conjunctiva are pink and non-injected, sclera clear NECK: supple, non-tender LYMPH:  no palpable lymphadenopathy in the cervical, axillary or inguinal LUNGS: clear to auscultation and percussion with normal breathing effort HEART: regular rate & rhythm and no murmurs and no lower extremity edema Musculoskeletal: no cyanosis of digits and no clubbing  PSYCH: alert & oriented x 3, fluent speech NEURO: no focal motor/sensory deficits  LABORATORY DATA:  I have reviewed the data as listed    Latest Ref Rng & Units 04/09/2022    1:38 PM 03/11/2022    8:27 AM 10/07/2021    1:23 PM  CBC  WBC 4.0 - 10.5 K/uL 66.7  57.4  28.3   Hemoglobin 12.0 - 15.0 g/dL 11.2  11.2  12.7   Hematocrit 36.0 - 46.0 % 33.8  34.6  39.1   Platelets 150 - 400 K/uL 161  181  198        Latest Ref Rng & Units 04/09/2022    1:38 PM 03/11/2022    8:27 AM 09/23/2021   10:38 AM  CMP  Glucose 70 - 99 mg/dL 127  92  102   BUN 8 - 23 mg/dL _0 Creatinine 0.44 - 1.00 mg/dL 1.07  0.91  1.21   Sodium 135 - 145 mmol/L 131  138  139   Potassium 3.5 - 5.1 mmol/L 4.8  4.2  3.9   Chloride 98 - 111 mmol/L 102  107  108   CO2 22 - 32 mmol/L _1 Calcium 8.9 - 10.3 mg/dL 9.6  9.5  9.7   Total Protein 6.5 - 8.1 g/dL 6.8  6.6  7.1   Total Bilirubin 0.3 - 1.2 mg/dL 0.5  0.4  0.4   Alkaline Phos 38 - 126 U/L 81  93  90   AST 15 - 41 U/L _2 ALT 0 - 44 U/L _3 RADIOGRAPHIC STUDIES: No results found.  ASSESSMENT & PLAN [name] [name] 76 y.o. 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  History Adapted from Dr. Virgie Dad last note:   (1) status post right breast lumpectomy and axillary lymph  node dissection in 1997 for a stage I breast cancer, treated with adjuvant radiation and tamoxifen for 5 years   (2) status post right breast lower outer quadrant biopsy 07/13/2013 for a clinical T1c N0, stage IA invasive ductal carcinoma, grade 2, estrogen receptor 100% positive, progesterone receptor 13% positive, with an MIB-1 of 33%, and HER-2 amplification by CISH with a HER2/CEP 17 ratio of 3.19, and an average HER-2 copy number per cell of 4.15   (3) status post right mastectomy 07/28/2013 for a pT1c pN0, stage IA invasive ductal carcinoma, grade 3, with close but negative margins. Prognostic panel was not repeated             (a) the patient met with Dr. Harlow Mares and has decided against reconstruction   (4) completed weekly paclitaxel x12 12/06/2913, with trastuzumab/ pertuzumab every 3 weeks; pertuzumab was held with third dose on 11/01/2013 due  to diarrhea, tried a half dose on cycle 4 again with diarrhea developing   (5) trastuzumab (started 09/20/2013) continued for 1 year, last dose 09/21/2014;             (a) final echocardiogram 09/07/2014 showed an ejection fraction of 55-60%   (6) anastrozole started May 2015; completed April 2020             (a) bone density 12/15/2013 normal             (b) bone density 02/01/2016 at [facility] was normal with a T score of -1.0     OTHER PROBLEMS: (a) History of chronic lymphoid leukemia diagnosed by flow cytometry 06/29/2013, the cells being CD5, CD20 and CD23 positive, CD10 negative.              (1) right renal mass resected on 02/05/15, consisting of atypical lymphoid proliferation composed of monotonous small lymphocytes              (2) Anemia with a normal MCV and normal ferritin-- B-12 and folate normal; stable             (3)  ibrutinib 280 milligrams daily started 03/26/2019, discontinued 06/03/2019 because of concerns regarding dosing             (4) rituximab weekly x8 started 06/15/2019 completed December 2020             (5)  maintenance rituximab (Q 2 months) started 10/25/2019, continued through 08/03/2020   # CLL Rai Stage I --WBC flared to 43.3 on 09/18/2021, appears to be highly fluctuant. Dropped to 28.3 on 10/07/2021, but now is 57.4 today. Reportedly was 82 on 02/27/2022 with PCP.  WBC 66.7 today. --Due to rapid doubling time of white blood cell count will need to pursue therapy.  Discussed options with patient she was agreeable to pursuing acalabrutinib 100 mg twice daily --labs today show white blood cell count 66.7, hemoglobin 11.2, MCV 88.3, and platelets of 161. --Last imaging of the abdomen in 2022 showed no evidence of splenomegaly --Return to clinic in 1 month to assure she is tolerating therapy.   # ER + Breast Cancer in Survivorship -- Continue yearly mammograms. --last mammogram on 05/30/2021.  --Currently on survivorship.  No orders of the defined types were placed in this encounter.   All questions were answered. The patient knows to call the clinic with any problems, questions or concerns.  A total of more than 30 minutes were spent on this encounter with face-to-face time and non-face-to-face time, including preparing to see the patient, ordering tests and/or medications, counseling the patient and coordination of care as outlined above.   Ledell Peoples, MD Department of Hematology/Oncology [place] at [facility] Phone: [phone] Pager: [phone] Email: [name]  04/27/2022 7:50 PM

## 2022-04-22 ENCOUNTER — Encounter: Payer: Medicare Other | Admitting: Physical Therapy

## 2022-04-22 DIAGNOSIS — Z961 Presence of intraocular lens: Secondary | ICD-10-CM | POA: Diagnosis not present

## 2022-04-22 DIAGNOSIS — H04123 Dry eye syndrome of bilateral lacrimal glands: Secondary | ICD-10-CM | POA: Diagnosis not present

## 2022-04-22 DIAGNOSIS — H35033 Hypertensive retinopathy, bilateral: Secondary | ICD-10-CM | POA: Diagnosis not present

## 2022-04-22 DIAGNOSIS — H353131 Nonexudative age-related macular degeneration, bilateral, early dry stage: Secondary | ICD-10-CM | POA: Diagnosis not present

## 2022-04-24 ENCOUNTER — Encounter: Payer: Self-pay | Admitting: Physical Therapy

## 2022-04-24 ENCOUNTER — Ambulatory Visit: Payer: Medicare Other | Admitting: Physical Therapy

## 2022-04-24 DIAGNOSIS — M5459 Other low back pain: Secondary | ICD-10-CM | POA: Diagnosis not present

## 2022-04-24 DIAGNOSIS — R293 Abnormal posture: Secondary | ICD-10-CM

## 2022-04-24 NOTE — Therapy (Signed)
OUTPATIENT PHYSICAL THERAPY THORACOLUMBAR TREATMENT   Patient Name: [name] [name] MRN: [id number] DOB:[birth year], 76 y.o., female Today's Date: 04/24/2022   PT End of Session - 04/24/22 1306     Visit Number 7    Number of Visits 12    Date for PT Re-Evaluation 05/05/22    Authorization Type BCBS    PT Start Time 1308    PT Stop Time 1346    PT Time Calculation (min) 38 min    Activity Tolerance Patient tolerated treatment well    Behavior During Therapy WFL for tasks assessed/performed              Past Medical History:  Diagnosis Date   Alopecia    Anxiety    Arthritis    "spine" (07/28/2013)   Breast cancer ([place]) 1997; 2014   right   CAP (community acquired pneumonia)    admission 06-04-2013, failed outpatient   Chronic back pain    "lower back and upper neck" (07/28/2013)   CLL (chronic lymphocytic leukemia) ([place]) 05/2013   DDD (degenerative disc disease)    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 ear    Depression    Diverticulosis    Elevated LFTs    Fibromyalgia 1978   GERD (gastroesophageal reflux disease)    Hematuria    History of syncope    episode 2008--  no recurrence since   HLD (hyperlipidemia)    Hypothyroidism    Kidney stones 2014   Plantar fasciitis    Ruptured disk    one in neck and two in back   S/P chemotherapy, time since greater than 12 weeks    Sinus tachycardia    mild resting   Skin cancer    nose   Stool incontinence    "at times recently"    Past Surgical History:  Procedure Laterality Date   ANTERIOR LATERAL LUMBAR FUSION 4 LEVELS Left 04/25/2016   Procedure: LEFT LUMBAR ONE-TWO, LUMBAR TWO-THREE, LUMBAR THREE-FOUR, LUMBAR FOUR-FIVE ANTERIOR LATERAL LUMBAR FUSION;  Surgeon: Earnie Larsson, MD;  Location: [facility];  Service: Neurosurgery;  Laterality: Left;   APPENDECTOMY  5916   APPLICATION OF ROBOTIC ASSISTANCE FOR SPINAL PROCEDURE N/A 04/06/2017   Procedure: APPLICATION OF ROBOTIC ASSISTANCE FOR SPINAL PROCEDURE;  Surgeon: Earnie Larsson, MD;   Location: [facility];  Service: Neurosurgery;  Laterality: N/A;   BREAST BIOPSY Right 2014   BREAST LUMPECTOMY Right 1997   BREAST LUMPECTOMY WITH AXILLARY LYMPH NODE DISSECTION Right 1997   CATARACT EXTRACTION, BILATERAL  2019   Dr. Kathlen Mody   CYSTOSCOPY WITH RETROGRADE PYELOGRAM, URETEROSCOPY AND STENT PLACEMENT Bilateral 06/17/2013   Procedure: CYSTOSCOPY WITH RETROGRADE PYELOGRAM, URETEROSCOPY AND LEFT DOUBLE  J STENT PLACEMENT RIGHT URETERAL HOLMIIUM LASER AND DOUBLE J STENT ;  Surgeon: Hanley Ben, MD;  Location: [facility];  Service: Urology;  Laterality: Bilateral;   HOLMIUM LASER APPLICATION Bilateral 38/46/6599   Procedure: HOLMIUM LASER APPLICATION;  Surgeon: Hanley Ben, MD;  Location: [facility];  Service: Urology;  Laterality: Bilateral;   LITHOTRIPSY Left 06/2013   LUMBAR EPIDURAL INJECTION     has had 7 injections   PORT-A-CATH REMOVAL Left 10/03/2014   Procedure: REMOVAL PORT-A-CATH;  Surgeon: Donnie Mesa, MD;  Location: [facility];  Service: General;  Laterality: Left;   PORTACATH PLACEMENT Left 07/28/2013   Procedure: ATTEMPTED INSERTION PORT-A-CATH;  Surgeon: Imogene Burn. Georgette Dover, MD;  Location: [facility];  Service: General;  Laterality: Left;   POSTERIOR LUMBAR FUSION 4 LEVEL N/A  04/25/2016   Procedure: LUMBAR FIVE-SACRAL ONE POSTERIOR LUMBAR INTERBODY FUSION, THORACIC NINE-SACRAL ONE POSTERIOR LATERAL ARTHRODESIS WITH PEDICLE SCREWS;  Surgeon: Earnie Larsson, MD;  Location: [facility];  Service: Neurosurgery;  Laterality: N/A;   RETINAL DETACHMENT SURGERY Right 2013   ROBOTIC ASSITED PARTIAL NEPHRECTOMY Right 02/05/2015   Procedure: ROBOTIC ASSITED PARTIAL NEPHRECTOMY;  Surgeon: Raynelle Bring, MD;  Location: [facility];  Service: Urology;  Laterality: Right;   SIMPLE MASTECTOMY WITH AXILLARY SENTINEL NODE BIOPSY Right 07/28/2013   Procedure: RIGHT TOTAL  MASTECTOMY;  Surgeon: Imogene Burn. 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 09/25/2020   Superficial thrombophlebitis 08/23/2018   Iron deficiency anemia 10/01/2017   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   Status post lumbar spine operative procedure for decompression of spinal cord 05/26/2016   Depression 05/05/2016   Degenerative spondylolisthesis 04/25/2016   Spasm of muscle, back 04/25/2016   Genetic testing 11/30/2015   Essential hypertension 10/18/2015   Morbid obesity ([place]) 10/18/2015   Upper airway cough syndrome 10/17/2015   Renal mass 02/05/2015   Anemia 10/25/2013   GERD (gastroesophageal reflux disease) 09/27/2013   CLL (chronic lymphocytic leukemia) ([place]) 07/27/2013   Malignant neoplasm of lower-outer quadrant of right breast of female, estrogen receptor positive ([place]) 07/26/2013   Nephrolithiasis 06/16/2013   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 ear 05/30/2013   Chronic pain syndrome 05/10/2013   DDD (degenerative disc disease) 05/10/2013   SOB (shortness of breath)    Sinus tachycardia    HLD (hyperlipidemia)    Orthostatic hypotension    Anxiety    Arthritis    Diverticulosis    Fibromyalgia    Hypothyroidism     PCP: Deland Pretty   REFERRING PROVIDER: Lynne Leader   REFERRING DIAG: Back pain  Rationale for Evaluation and Treatment Rehabilitation  THERAPY DIAG:  Other low back pain  Abnormal posture  ONSET DATE:   SUBJECTIVE:                                                                                                                                                                                            SUBJECTIVE STATEMENT:  Pt states minimal change. Thinks she "may" be having less pain in her back. Still most painful with standing.    Eval: Pt states longstanding pain in back, worsening. Has fusion from T9 - L1 and L2-5.  States Pain in thoracic/lumbar and cervical spine. Most pain with standing/walking activity, IADLS, up to 16/10.  Also has hip soreness with walking. Does 30 min/day on bike.  Has stairs to basement, does ok with that.    PERTINENT HISTORY:  Fibromyalgia, Hip pain, Breast CA, neuropathy.  Has had 2 previous falls with injuries.   PAIN:  Are you having pain? Yes: NPRS scale: 9-10/10 Pain location: T-spine and c-spine Pain description: [place] Aggravating factors: standing activity, IADLS Relieving factors: sitting   PRECAUTIONS: None  WEIGHT BEARING RESTRICTIONS No  FALLS:  Has patient fallen in last 6 months? No   PLOF: Independent  PATIENT GOALS  decreased pain    OBJECTIVE:   DIAGNOSTIC FINDINGS:   COGNITION:  Overall cognitive status: Within functional limits for tasks assessed     POSTURE:   Seated: leaned back posture, ppt.   PALPATION: Pain in bil,  R>L  T and L spine, more pain laterally on R,  Tenderness in bil cervical paraspinals, mild pain in central c-spine with PA s .   LUMBAR ROM: significant limitations due to fusions LE ROM: Hips: mild limitation for rot and ext.  Cervical ROM:  mild limitation for ext and rotation bil;     LOWER EXTREMITY MMT:    Hips: 4-/5,  Knees: 4/5   LUMBAR SPECIAL TESTS:     TODAY'S TREATMENT   04/24/22: Therapeutic Exercise: Aerobic: Supine:  Bridging x 12;  Seated:  Pelvic tilts x 20- education on form;  Sit to stand x10, practice for fwd lean.  Standing:  Rows RTB x 15;  Bil ER RTB 2 x10;  Shoulder flexion to tolerance 2x10;    Stretches:   Seated fwd flexion 30 sec x 3; SKTC 30 sec x 2  bil;  supine decompression UE/LE stretch 5 sec x 10 bil; ;  Neuromuscular Re-education: Manual Therapy: long leg distraction for lumbar- bil;  Self Care:    04/17/22: Therapeutic Exercise: Aerobic: Supine:   Bridging x 12;  Seated: Sit to stand 2 x 5, practice for fwd lean.  Standing:  Rows RTB x 15;  Bil ER RTB 2 x10;  Shoulder flexion to tolerance 2x10; Shoulder abd to 90 deg 2x10 with practice for optimal posture and no pain in back;  Fwd reach withrotation x 10 bil;  Stretches:   SKTC 30 sec x 2 bil;  supine decompression UE/LE stretch 5 sec x 10 bil; ;  Neuromuscular Re-education: Manual Therapy: Self Care:     PATIENT EDUCATION:  Education details: reviewed HEP,  Person educated: Patient Education method: Explanation, Demonstration, Tactile cues, Verbal cues, and Handouts Education comprehension: verbalized understanding, returned demonstration, verbal cues required, tactile cues required, and needs further education   HOME EXERCISE PROGRAM: Access Code: [email or web address]  ASSESSMENT:  CLINICAL IMPRESSION: Pt with good ability for exercises. Has been able to progress mobility with Pain only with longer duration standing. Plan to progress as tolerated with focus on mobility, decompression and strength.    OBJECTIVE IMPAIRMENTS Abnormal gait, decreased activity tolerance, decreased mobility, decreased ROM, decreased strength, increased muscle spasms, impaired flexibility, improper body mechanics, postural dysfunction, and pain.   ACTIVITY LIMITATIONS carrying, lifting, bending, standing, and squatting  PARTICIPATION LIMITATIONS: cleaning, laundry, shopping, and community activity  PERSONAL FACTORS Time  since onset of injury/illness/exacerbation and excessive hardware/fusion  are also affecting patient's functional outcome.   REHAB POTENTIAL: Good  CLINICAL DECISION MAKING: Stable/uncomplicated  EVALUATION COMPLEXITY: Moderate   GOALS: Goals reviewed with patient?  Yes  SHORT TERM GOALS: Target date: 04/07/2022  Pt to be independent with initial HEP  Goal status: INITIAL  2.  Pt to demo optimal seated posture   Goal status: INITIAL  3.  Pt to demo ability for sit to stand transfer with optimal mechanics, on first attempt.    Goal status: INITIAL   LONG TERM GOALS: Target date: 05/05/2022  Pt to be independent with final HEp  Goal status: INITIAL  2.  Pt to report decreased pain in back to 0-5/10 with standing and IADLS.   Goal status: INITIAL  3.  Pt to demo optimal mechanics for bend, squat, reach, lift for improved ability for IADLs .   Goal status: INITIAL  4.  Pt to demo improved strength of bil hips to at least 4+/5 to improve stability and pain.   Goal status: INITIAL    PLAN: PT FREQUENCY: 2x/week  PT DURATION: 6 weeks  PLANNED INTERVENTIONS: Therapeutic exercises, Therapeutic activity, Neuromuscular re-education, Balance training, Gait training, Patient/Family education, Self Care, and Joint mobilization.  PLAN FOR NEXT SESSION:    Lyndee Hensen, PT, DPT 4:22 PM  04/24/22

## 2022-04-25 ENCOUNTER — Other Ambulatory Visit: Payer: Self-pay

## 2022-04-25 ENCOUNTER — Telehealth: Payer: Self-pay

## 2022-04-25 NOTE — Telephone Encounter (Signed)
Patient called and stated she has been taking the Meloxicam for a couple of weeks and it is not helping much. She states she is still taking it. Please advise

## 2022-04-27 ENCOUNTER — Encounter: Payer: Self-pay | Admitting: Oncology

## 2022-04-27 MED ORDER — ACALABRUTINIB 100 MG PO CAPS
100.0000 mg | ORAL_CAPSULE | Freq: Two times a day (BID) | ORAL | 1 refills | Status: DC
  Filled 2022-04-27: qty 60, 30d supply, fill #0

## 2022-04-28 ENCOUNTER — Other Ambulatory Visit (HOSPITAL_COMMUNITY): Payer: Self-pay

## 2022-04-29 ENCOUNTER — Encounter: Payer: Self-pay | Admitting: Oncology

## 2022-04-29 ENCOUNTER — Telehealth: Payer: Self-pay | Admitting: Pharmacy Technician

## 2022-04-29 ENCOUNTER — Telehealth: Payer: Self-pay | Admitting: Pharmacist

## 2022-04-29 ENCOUNTER — Other Ambulatory Visit (HOSPITAL_COMMUNITY): Payer: Self-pay

## 2022-04-29 DIAGNOSIS — C9110 Chronic lymphocytic leukemia of B-cell type not having achieved remission: Secondary | ICD-10-CM

## 2022-04-29 MED ORDER — CALQUENCE 100 MG PO TABS
1.0000 | ORAL_TABLET | Freq: Two times a day (BID) | ORAL | 1 refills | Status: DC
  Filled 2022-04-29: qty 60, 30d supply, fill #0
  Filled 2022-04-29: qty 60, fill #0
  Filled 2022-05-19: qty 60, 30d supply, fill #1

## 2022-04-29 NOTE — Telephone Encounter (Signed)
Oral Oncology Patient Advocate Encounter  Prior Authorization for Calquence has been approved.    PA# [id number] Effective dates: 04/29/2022 through 04/30/2023  Patients co-pay is $1,134.11.    [name] [name], CPhT-Adv Oncology Pharmacy Patient [place] Direct Number: [phone]  Fax: [phone]

## 2022-04-29 NOTE — Telephone Encounter (Signed)
Oral Oncology Patient Advocate Encounter   Received notification that prior authorization for Calquence is required.   PA submitted on 04/29/2022 Key [id number] Status is pending     [name] [name], CPhT-Adv Oncology Pharmacy Patient [place] Direct Number: [phone]  Fax: [phone]

## 2022-04-29 NOTE — Telephone Encounter (Signed)
Oral Oncology Pharmacist Encounter  Received new prescription for Calquence (acalabrutinib) for the treatment of CLL, planned duration until disease progression or unacceptable drug toxicity.  Prescription dose and frequency assessed.   Current medication list in Epic reviewed, a few DDIs with acalabrutinib identified: Aspirin, duloxetine, and meloxicam: Acalabrutinib may enhance the antiplatelet effect of agents with antiplatelet properties, such as aspirin, duloxetine, and meloxicam. Monitor platelet count and for signs of bleeding. No baseline dose adjustment needed.  Evaluated chart and no patient barriers to medication adherence identified.   Prescription has been e-scribed to the [facility] for benefits analysis and approval.  Oral Oncology Clinic will continue to follow for insurance authorization, copayment issues, initial counseling and start date.  Patient agreed to treatment on 04/18/22 per MD documentation.  Darl Pikes, PharmD, BCPS, BCOP, CPP Hematology/Oncology Clinical Pharmacist Practitioner [place]/DB/AP Oral [facility] [phone]  04/29/2022 10:03 AM

## 2022-04-29 NOTE — Telephone Encounter (Signed)
Oral Oncology Patient Advocate Encounter  Was successful in securing patient a $8,000 grant from [facility] to provide copayment coverage for Calquence.  This will keep the out of pocket expense at $0.     [employer] ID: [id number]  I have spoken with the patient.   The billing information is as follows and has been shared with [facility].    RxBin: [id number] PCN: [id number] Member ID: [id number] Group ID: [id number] Dates of Eligibility: 03/30/2022 through 03/30/2023  Fund:  [place], [facility] Patient [place] Direct Number: [phone]  Fax: [phone]

## 2022-04-29 NOTE — Telephone Encounter (Signed)
Oral Chemotherapy Pharmacist Encounter  [place] will deliver medication on 04/30/22. She knows to get started once she had medication in hand.  Patient Education I spoke with patient for overview of new oral chemotherapy medication: Calquence (acalabrutinib) for the treatment of CLL, planned duration until disease progression or unacceptable drug toxicity.   Pt is doing well. Counseled patient on administration, dosing, side effects, monitoring, drug-food interactions, safe handling, storage, and disposal. Patient will take 1 tablet by mouth 2 (two) times daily.  Side effects include but not limited to: headache, diarrhea, decreased wbc/hgb/plt.   Headaches: patient informed that acalabrutinib headaches typically respond well to caffeine. Suggested she drink a caffeinated beverage if a headache occurs Diarrhea: Suggested she pick some loperamide to have on hand if needed. She know to call the office if she is using the loperamide but continuing to have 4 or more loose stools  Reviewed with patient importance of keeping a medication schedule and plan for any missed doses.  After discussion with patient no patient barriers to medication adherence identified.   Ms. Lisle voiced understanding and appreciation. All questions answered. Medication handout provided.  Provided patient with Oral [facility] phone number. Patient knows to call the office with questions or concerns. Oral Chemotherapy Navigation Clinic will continue to follow.  Darl Pikes, PharmD, BCPS, BCOP, CPP Hematology/Oncology Clinical Pharmacist Practitioner [place]/DB/AP Oral [facility] [phone]  04/29/2022 10:56 AM

## 2022-05-01 ENCOUNTER — Other Ambulatory Visit (HOSPITAL_COMMUNITY): Payer: Self-pay

## 2022-05-06 ENCOUNTER — Ambulatory Visit: Payer: Medicare Other | Admitting: Physical Therapy

## 2022-05-06 ENCOUNTER — Encounter: Payer: Self-pay | Admitting: Physical Therapy

## 2022-05-06 DIAGNOSIS — R293 Abnormal posture: Secondary | ICD-10-CM

## 2022-05-06 DIAGNOSIS — M5459 Other low back pain: Secondary | ICD-10-CM | POA: Diagnosis not present

## 2022-05-06 NOTE — Therapy (Addendum)
OUTPATIENT PHYSICAL THERAPY THORACOLUMBAR TREATMENT   Patient Name: [name] [name] MRN: [id number] DOB:08-24-1946, 76 y.o., female Today's Date: 05/06/2022   PT End of Session - 05/06/22 1155     Visit Number 8    Number of Visits 12    Date for PT Re-Evaluation 05/05/22    Authorization Type BC[employer]    PT Start Time 1104    PT Stop Time 1142    PT Time Calculation (min) 38 min    Activity Tolerance Patient tolerated treatment well    Behavior During Therapy WFL for tasks assessed/performed               Past Medical History:  Diagnosis Date   Alopecia    Anxiety    Arthritis    "spine" (07/28/2013)   Breast cancer ([place]) 1997; 2014   right   CAP (community acquired pneumonia)    admission 06-04-2013, failed outpatient   Chronic back pain    "lower back and upper neck" (07/28/2013)   CLL (chronic lymphocytic leukemia) ([place]) 05/2013   DDD (degenerative disc disease)    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 ear    Depression    Diverticulosis    Elevated LFTs    Fibromyalgia 1978   GERD (gastroesophageal reflux disease)    Hematuria    History of syncope    episode 2008--  no recurrence since   HLD (hyperlipidemia)    Hypothyroidism    Kidney stones 2014   Plantar fasciitis    Ruptured disk    one in neck and two in back   S/P chemotherapy, time since greater than 12 weeks    Sinus tachycardia    mild resting   Skin cancer    nose   Stool incontinence    "at times recently"    Past Surgical History:  Procedure Laterality Date   ANTERIOR LATERAL LUMBAR FUSION 4 LEVELS Left 04/25/2016   Procedure: LEFT LUMBAR ONE-TWO, LUMBAR TWO-THREE, LUMBAR THREE-FOUR, LUMBAR FOUR-FIVE ANTERIOR LATERAL LUMBAR FUSION;  Surgeon: Earnie Larsson, MD;  Location: [facility];  Service: Neurosurgery;  Laterality: Left;   APPENDECTOMY  2330   APPLICATION OF ROBOTIC ASSISTANCE FOR SPINAL PROCEDURE N/A 04/06/2017   Procedure: APPLICATION OF ROBOTIC ASSISTANCE FOR SPINAL PROCEDURE;  Surgeon: Earnie Larsson,  MD;  Location: [facility];  Service: Neurosurgery;  Laterality: N/A;   BREAST BIOPSY Right 2014   BREAST LUMPECTOMY Right 1997   BREAST LUMPECTOMY WITH AXILLARY LYMPH NODE DISSECTION Right 1997   CATARACT EXTRACTION, BILATERAL  2019   Dr. Kathlen Mody   CYSTOSCOPY WITH RETROGRADE PYELOGRAM, URETEROSCOPY AND STENT PLACEMENT Bilateral 06/17/2013   Procedure: CYSTOSCOPY WITH RETROGRADE PYELOGRAM, URETEROSCOPY AND LEFT DOUBLE  J STENT PLACEMENT RIGHT URETERAL HOLMIIUM LASER AND DOUBLE J STENT ;  Surgeon: Hanley Ben, MD;  Location: [facility];  Service: Urology;  Laterality: Bilateral;   HOLMIUM LASER APPLICATION Bilateral 07/62/2633   Procedure: HOLMIUM LASER APPLICATION;  Surgeon: Hanley Ben, MD;  Location: [facility];  Service: Urology;  Laterality: Bilateral;   LITHOTRIPSY Left 06/2013   LUMBAR EPIDURAL INJECTION     has had 7 injections   PORT-A-CATH REMOVAL Left 10/03/2014   Procedure: REMOVAL PORT-A-CATH;  Surgeon: Donnie Mesa, MD;  Location: [facility];  Service: General;  Laterality: Left;   PORTACATH PLACEMENT Left 07/28/2013   Procedure: ATTEMPTED INSERTION PORT-A-CATH;  Surgeon: Imogene Burn. Georgette Dover, MD;  Location: [facility];  Service: General;  Laterality: Left;   POSTERIOR LUMBAR FUSION 4 LEVEL  N/A 04/25/2016   Procedure: LUMBAR FIVE-SACRAL ONE POSTERIOR LUMBAR INTERBODY FUSION, THORACIC NINE-SACRAL ONE POSTERIOR LATERAL ARTHRODESIS WITH PEDICLE SCREWS;  Surgeon: Earnie Larsson, MD;  Location: [facility];  Service: Neurosurgery;  Laterality: N/A;   RETINAL DETACHMENT SURGERY Right 2013   ROBOTIC ASSITED PARTIAL NEPHRECTOMY Right 02/05/2015   Procedure: ROBOTIC ASSITED PARTIAL NEPHRECTOMY;  Surgeon: Raynelle Bring, MD;  Location: [facility];  Service: Urology;  Laterality: Right;   SIMPLE MASTECTOMY WITH AXILLARY SENTINEL NODE BIOPSY Right 07/28/2013   Procedure: RIGHT TOTAL  MASTECTOMY;  Surgeon: Imogene Burn. 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 09/25/2020   Superficial thrombophlebitis 08/23/2018   Iron deficiency anemia 10/01/2017   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   Status post lumbar spine operative procedure for decompression of spinal cord 05/26/2016   Depression 05/05/2016   Degenerative spondylolisthesis 04/25/2016   Spasm of muscle, back 04/25/2016   Genetic testing 11/30/2015   Essential hypertension 10/18/2015   Morbid obesity ([place]) 10/18/2015   Upper airway cough syndrome 10/17/2015   Renal mass 02/05/2015   Anemia 10/25/2013   GERD (gastroesophageal reflux disease) 09/27/2013   CLL (chronic lymphocytic leukemia) ([place]) 07/27/2013   Malignant neoplasm of lower-outer quadrant of right breast of female, estrogen receptor positive ([place]) 07/26/2013   Nephrolithiasis 06/16/2013   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 ear 05/30/2013   Chronic pain syndrome 05/10/2013   DDD (degenerative disc disease) 05/10/2013   SOB (shortness of breath)    Sinus tachycardia    HLD (hyperlipidemia)    Orthostatic hypotension    Anxiety    Arthritis    Diverticulosis    Fibromyalgia    Hypothyroidism     PCP: Deland Pretty   REFERRING PROVIDER: Lynne Leader   REFERRING DIAG: Back pain  Rationale for Evaluation and Treatment Rehabilitation  THERAPY DIAG:  Other low back pain  Abnormal posture  ONSET DATE:   SUBJECTIVE:                                                                                                                                                                                            SUBJECTIVE STATEMENT:  Oct 3, will have f/u with Pain management.  CLL -  started taking Calquence , recently, has had some headaches, dry mouth, and pulsating in L ear, will f/u with MD for this.  Back pain is no better. Still having significant pain with only a few minutes of standing.     Eval: Pt states longstanding pain in back, worsening. Has fusion from T9 - L1 and L2-5.  States Pain in thoracic/lumbar and cervical spine. Most pain with standing/walking activity, IADLS, up to 32/67.  Also has hip soreness with walking. Does 30 min/day on bike.  Has stairs to basement, does ok with that.    PERTINENT HISTORY:  Fibromyalgia, Hip pain, Breast CA, neuropathy.  Has had 2 previous falls with injuries.   PAIN:  Are you having pain? Yes: NPRS scale: 9-10/10 Pain location: T-spine and c-spine Pain description: [place] Aggravating factors: standing activity, IADLS Relieving factors: sitting   PRECAUTIONS: None  WEIGHT BEARING RESTRICTIONS No  FALLS:  Has patient fallen in last 6 months? No   PLOF: Independent  PATIENT GOALS  decreased pain    OBJECTIVE:   DIAGNOSTIC FINDINGS:   COGNITION:  Overall cognitive status: Within functional limits for tasks assessed     POSTURE:   Seated: leaned back posture, ppt.   PALPATION:    LUMBAR ROM: significant limitations due to fusions LE ROM: Hips: mild limitation for rot and ext.  Cervical ROM:  mild limitation for ext and rotation bil;     LOWER EXTREMITY MMT:    Hips: 4+/5,  Knees: 4+/5   LUMBAR SPECIAL TESTS:     TODAY'S TREATMENT   05/06/22: Therapeutic Exercise: Aerobic: Supine:  Bridging x 10;  Seated:    Sit to stand x10, no UE support   Standing:  Rows GTB x 15;  Bil ER RTB 2 x10; Scap squeeze x 10; Marching x 20;  Stretches:   Seated fwd flexion 30 sec x 3; SKTC 30 sec x 2 bil;  Neuromuscular  Re-education: Manual Therapy:  Self Care: discussed need for f/u with MD/sports med for back pain, as well as pain  management, to report that initial dose of medication is not improving pain. Pt states understanding. Reviewed final HEP and importance of continuing exercises.     04/24/22: Therapeutic Exercise: Aerobic: Supine:  Bridging x 12;  Seated:  Pelvic tilts x 20- education on form;  Sit to stand x10, practice for fwd lean.  Standing:  Rows RTB x 15;  Bil ER RTB 2 x10;  Shoulder flexion to tolerance 2x10;    Stretches:   Seated fwd flexion 30 sec x 3; SKTC 30 sec x 2 bil;  supine decompression UE/LE stretch 5 sec x 10 bil; ;  Neuromuscular Re-education: Manual Therapy: long leg distraction for lumbar- bil;  Self Care:    PATIENT EDUCATION:  Education details: reviewed HEP,  Person educated: Patient Education method: Explanation, Demonstration, Tactile cues, Verbal cues, and Handouts Education comprehension: verbalized understanding, returned demonstration, verbal cues required, tactile cues required, and needs further education   HOME EXERCISE PROGRAM: Access Code: [email or web address]  ASSESSMENT:  CLINICAL IMPRESSION: Pt has been seen for 8 visits. She has not had any reduction in pain. She continues to have much difficulty sustaining standing activity, due to pain. She is doing well with exercises and strengthening, but has limited ability for standing activity. Will hold at this time, pt will f/u with Sports med (referring MD) and pain management. Pt in agreement with plan.  OBJECTIVE IMPAIRMENTS Abnormal gait, decreased activity tolerance, decreased mobility, decreased ROM, decreased strength, increased muscle spasms, impaired flexibility, improper body mechanics, postural dysfunction, and pain.   ACTIVITY LIMITATIONS carrying, lifting, bending, standing, and squatting  PARTICIPATION LIMITATIONS: cleaning, laundry, shopping, and community activity  PERSONAL FACTORS Time  since onset of injury/illness/exacerbation and excessive hardware/fusion  are also affecting patient's functional outcome.   REHAB POTENTIAL: Good  CLINICAL DECISION MAKING: Stable/uncomplicated  EVALUATION COMPLEXITY: Moderate   GOALS: Goals reviewed with patient? Yes  SHORT TERM GOALS: Target date: 04/07/2022  Pt to be independent with initial HEP  Goal status: MET  2.  Pt to demo optimal seated posture   Goal status: MET  3.  Pt to demo ability for sit to stand transfer with optimal mechanics, on first attempt.    Goal status: MET   LONG TERM GOALS: Target date: 05/05/2022  Pt to be independent with final HEp  Goal status: MET  2.  Pt to report decreased pain in back to 0-5/10 with standing and IADLS.   Goal status: NOT MET  3.  Pt to demo optimal mechanics for bend, squat, reach, lift for improved ability for IADLs .   Goal status: MET  4.  Pt to demo improved strength of bil hips to at least 4+/5 to improve stability and pain.   Goal status: MET    PLAN: PT FREQUENCY: 2x/week  PT DURATION: 6 weeks  PLANNED INTERVENTIONS: Therapeutic exercises, Therapeutic activity, Neuromuscular re-education, Balance training, Gait training, Patient/Family education, Self Care, and Joint mobilization.  PLAN FOR NEXT SESSION:    Lyndee Hensen, PT, DPT 11:56 AM  05/06/22    PHYSICAL THERAPY DISCHARGE SUMMARY  Visits from Start of Care:8  Plan: Patient agrees to discharge.  Patient goals were partially met. Patient is being discharged due to - will f/u with MD   Lyndee Hensen, PT, DPT 8:24 AM  06/24/22

## 2022-05-07 ENCOUNTER — Telehealth: Payer: Self-pay

## 2022-05-07 NOTE — Telephone Encounter (Signed)
Requesting phone visit

## 2022-05-08 ENCOUNTER — Other Ambulatory Visit: Payer: Self-pay

## 2022-05-08 ENCOUNTER — Telehealth: Payer: Self-pay

## 2022-05-08 DIAGNOSIS — C9110 Chronic lymphocytic leukemia of B-cell type not having achieved remission: Secondary | ICD-10-CM

## 2022-05-08 NOTE — Telephone Encounter (Signed)
Patient notified to not refill medication

## 2022-05-08 NOTE — Telephone Encounter (Signed)
Patient called and stated the Meloxicam is not working. She stated she has 1 tablet left and needs to know if she needs to get it refilled since it is not working. Please advise

## 2022-05-09 ENCOUNTER — Telehealth: Payer: Self-pay | Admitting: *Deleted

## 2022-05-09 ENCOUNTER — Inpatient Hospital Stay: Payer: Medicare Other | Attending: Hematology and Oncology

## 2022-05-09 ENCOUNTER — Other Ambulatory Visit: Payer: Self-pay

## 2022-05-09 DIAGNOSIS — Z853 Personal history of malignant neoplasm of breast: Secondary | ICD-10-CM | POA: Diagnosis not present

## 2022-05-09 DIAGNOSIS — C9110 Chronic lymphocytic leukemia of B-cell type not having achieved remission: Secondary | ICD-10-CM | POA: Insufficient documentation

## 2022-05-09 LAB — CBC WITH DIFFERENTIAL (CANCER CENTER ONLY)
Abs Immature Granulocytes: 0.29 10*3/uL — ABNORMAL HIGH (ref 0.00–0.07)
Basophils Absolute: 0.1 10*3/uL (ref 0.0–0.1)
Basophils Relative: 0 %
Eosinophils Absolute: 0.5 10*3/uL (ref 0.0–0.5)
Eosinophils Relative: 0 %
HCT: 35.5 % — ABNORMAL LOW (ref 36.0–46.0)
Hemoglobin: 11.1 g/dL — ABNORMAL LOW (ref 12.0–15.0)
Immature Granulocytes: 0 %
Lymphocytes Relative: 92 %
Lymphs Abs: 113.4 10*3/uL — ABNORMAL HIGH (ref 0.7–4.0)
MCH: 28.6 pg (ref 26.0–34.0)
MCHC: 31.3 g/dL (ref 30.0–36.0)
MCV: 91.5 fL (ref 80.0–100.0)
Monocytes Absolute: 3.2 10*3/uL — ABNORMAL HIGH (ref 0.1–1.0)
Monocytes Relative: 3 %
Neutro Abs: 5.7 10*3/uL (ref 1.7–7.7)
Neutrophils Relative %: 5 %
Platelet Count: 205 10*3/uL (ref 150–400)
RBC: 3.88 MIL/uL (ref 3.87–5.11)
RDW: 14.7 % (ref 11.5–15.5)
Smear Review: NORMAL
WBC Count: 123.1 10*3/uL (ref 4.0–10.5)
nRBC: 0 % (ref 0.0–0.2)

## 2022-05-09 LAB — CMP (CANCER CENTER ONLY)
ALT: 19 U/L (ref 0–44)
AST: 29 U/L (ref 15–41)
Albumin: 4.8 g/dL (ref 3.5–5.0)
Alkaline Phosphatase: 74 U/L (ref 38–126)
Anion gap: 7 (ref 5–15)
BUN: 18 mg/dL (ref 8–23)
CO2: 24 mmol/L (ref 22–32)
Calcium: 9.6 mg/dL (ref 8.9–10.3)
Chloride: 96 mmol/L — ABNORMAL LOW (ref 98–111)
Creatinine: 1.06 mg/dL — ABNORMAL HIGH (ref 0.44–1.00)
GFR, Estimated: 54 mL/min — ABNORMAL LOW (ref >60)
Glucose, Bld: 73 mg/dL (ref 70–99)
Potassium: 5.3 mmol/L — ABNORMAL HIGH (ref 3.5–5.1)
Sodium: 127 mmol/L — ABNORMAL LOW (ref 135–145)
Total Bilirubin: 0.3 mg/dL (ref 0.3–1.2)
Total Protein: 6.9 g/dL (ref 6.5–8.1)

## 2022-05-09 LAB — LACTATE DEHYDROGENASE: LDH: 170 U/L (ref 98–192)

## 2022-05-09 MED ORDER — LIDOCAINE VISCOUS HCL 2 % MT SOLN: 15.0000 mL | OROMUCOSAL | 1 refills | Status: DC | PRN

## 2022-05-09 NOTE — Telephone Encounter (Signed)
TCT patient regarding her CBC results from today. Spoke with her and advised that her WBCs doubled but Dr. Lorenso Courier said that is to be expected on her new medication for CLL Calquence. Asked pt how she felt. She states the medication is making her tongue and gums very sore and painful, she is very fatigued as well. She also c/o pounding in her head when she wakes up in the morning for 2-3 hours then it goes away. It does happen every morning.  Denies fever, chills. Discussed with Dr. Lorenso Courier. We will send in prescription for Lidocaine gel for her mouth and then schedule her to be seen next week with repeat labs.  Relayed this information to patient. She voiced understanding.

## 2022-05-12 ENCOUNTER — Other Ambulatory Visit (HOSPITAL_COMMUNITY): Payer: Self-pay

## 2022-05-12 ENCOUNTER — Encounter: Payer: Medicare Other | Admitting: Physical Therapy

## 2022-05-12 DIAGNOSIS — F330 Major depressive disorder, recurrent, mild: Secondary | ICD-10-CM | POA: Diagnosis not present

## 2022-05-12 DIAGNOSIS — F411 Generalized anxiety disorder: Secondary | ICD-10-CM | POA: Diagnosis not present

## 2022-05-12 DIAGNOSIS — G894 Chronic pain syndrome: Secondary | ICD-10-CM | POA: Diagnosis not present

## 2022-05-14 ENCOUNTER — Encounter: Payer: Medicare Other | Admitting: Physical Therapy

## 2022-05-19 ENCOUNTER — Other Ambulatory Visit (HOSPITAL_COMMUNITY): Payer: Self-pay

## 2022-05-20 ENCOUNTER — Ambulatory Visit: Payer: Medicare Other | Admitting: Family Medicine

## 2022-05-20 VITALS — BP 130/82 | HR 79 | Ht 63.0 in | Wt 159.0 lb

## 2022-05-20 DIAGNOSIS — G8929 Other chronic pain: Secondary | ICD-10-CM

## 2022-05-20 DIAGNOSIS — M546 Pain in thoracic spine: Secondary | ICD-10-CM

## 2022-05-20 DIAGNOSIS — M47814 Spondylosis without myelopathy or radiculopathy, thoracic region: Secondary | ICD-10-CM | POA: Diagnosis not present

## 2022-05-20 DIAGNOSIS — Z9889 Other specified postprocedural states: Secondary | ICD-10-CM | POA: Diagnosis not present

## 2022-05-20 NOTE — Progress Notes (Unsigned)
   I, Peterson Lombard, LAT, ATC acting as a scribe for Lynne Leader, MD.  [name] [name] is a 76 y.o. female who presents to [facility] at [facility] today for f/u chronic thoracic back and neck pain. Of note, pt is currently being treated by Oncology for CLL and breast cancer. Pt has been seen by Dr. Annette Stable at [facility] w/ hx of thoracolumbar fusion surgery in 2017. Pt is also a pt at the [facility]. Pt was last seen by Dr. Georgina Snell on 04/16/22 and was advised to work on self-guided cognitive behavioral therapy. Today, pt reports thoracic back pain is the same. Pt has got the workbook for the self-guided cognitive therapy. Pt talked to [employer] at psych about the therapy and was told that they could look into it.   Dx imaging: 12/19/21 T-spine & L-spine CT             09/06/20 T-spine & c-spine MRI             05/11/18 T-spine & L-spine CT             04/06/17 L-spine XR             04/25/16 Thoracolumbar XR             02/13/16 L-spine MRI & L-spine XR  Pertinent review of systems: ***  Relevant historical information: ***   Exam:  LMP 08/26/1995  General: Well Developed, well nourished, and in no acute distress.   MSK: ***    Lab and Radiology Results No results found. However, due to the size of the patient record, not all encounters were searched. Please check Results Review for a complete set of results. No results found.     Assessment and Plan: 76 y.o. female with ***   PDMP not reviewed this encounter. No orders of the defined types were placed in this encounter.  No orders of the defined types were placed in this encounter.    Discussed warning signs or symptoms. Please see discharge instructions. Patient expresses understanding.   ***

## 2022-05-20 NOTE — Patient Instructions (Signed)
Thank you for coming in today.   Proceed to facet injections

## 2022-05-21 ENCOUNTER — Encounter: Payer: Medicare Other | Admitting: Physical Therapy

## 2022-05-22 ENCOUNTER — Other Ambulatory Visit: Payer: Self-pay

## 2022-05-22 ENCOUNTER — Encounter: Payer: Medicare Other | Admitting: Physical Therapy

## 2022-05-22 DIAGNOSIS — C9110 Chronic lymphocytic leukemia of B-cell type not having achieved remission: Secondary | ICD-10-CM

## 2022-05-23 ENCOUNTER — Inpatient Hospital Stay: Payer: Medicare Other

## 2022-05-23 ENCOUNTER — Telehealth: Payer: Self-pay

## 2022-05-23 ENCOUNTER — Other Ambulatory Visit (HOSPITAL_COMMUNITY): Payer: Self-pay

## 2022-05-23 ENCOUNTER — Inpatient Hospital Stay: Payer: Medicare Other | Admitting: Physician Assistant

## 2022-05-23 ENCOUNTER — Other Ambulatory Visit: Payer: Self-pay

## 2022-05-23 VITALS — BP 133/59 | HR 164 | Temp 97.3°F | Resp 16 | Ht 63.0 in | Wt 157.9 lb

## 2022-05-23 DIAGNOSIS — C9110 Chronic lymphocytic leukemia of B-cell type not having achieved remission: Secondary | ICD-10-CM

## 2022-05-23 DIAGNOSIS — Z853 Personal history of malignant neoplasm of breast: Secondary | ICD-10-CM | POA: Diagnosis not present

## 2022-05-23 LAB — CMP (CANCER CENTER ONLY)
ALT: 14 U/L (ref 0–44)
AST: 22 U/L (ref 15–41)
Albumin: 4.5 g/dL (ref 3.5–5.0)
Alkaline Phosphatase: 70 U/L (ref 38–126)
Anion gap: 4 — ABNORMAL LOW (ref 5–15)
BUN: 10 mg/dL (ref 8–23)
CO2: 28 mmol/L (ref 22–32)
Calcium: 9 mg/dL (ref 8.9–10.3)
Chloride: 93 mmol/L — ABNORMAL LOW (ref 98–111)
Creatinine: 0.78 mg/dL (ref 0.44–1.00)
GFR, Estimated: >60 mL/min (ref >60)
Glucose, Bld: 101 mg/dL — ABNORMAL HIGH (ref 70–99)
Potassium: 5.1 mmol/L (ref 3.5–5.1)
Sodium: 125 mmol/L — ABNORMAL LOW (ref 135–145)
Total Bilirubin: 0.5 mg/dL (ref 0.3–1.2)
Total Protein: 6.5 g/dL (ref 6.5–8.1)

## 2022-05-23 LAB — CBC WITH DIFFERENTIAL (CANCER CENTER ONLY)
Abs Immature Granulocytes: 0.17 10*3/uL — ABNORMAL HIGH (ref 0.00–0.07)
Basophils Absolute: 0.1 10*3/uL (ref 0.0–0.1)
Basophils Relative: 0 %
Eosinophils Absolute: 0.1 10*3/uL (ref 0.0–0.5)
Eosinophils Relative: 0 %
HCT: 31 % — ABNORMAL LOW (ref 36.0–46.0)
Hemoglobin: 9.9 g/dL — ABNORMAL LOW (ref 12.0–15.0)
Immature Granulocytes: 0 %
Lymphocytes Relative: 94 %
Lymphs Abs: 92.6 10*3/uL — ABNORMAL HIGH (ref 0.7–4.0)
MCH: 29 pg (ref 26.0–34.0)
MCHC: 31.9 g/dL (ref 30.0–36.0)
MCV: 90.9 fL (ref 80.0–100.0)
Monocytes Absolute: 1 10*3/uL (ref 0.1–1.0)
Monocytes Relative: 1 %
Neutro Abs: 5.2 10*3/uL (ref 1.7–7.7)
Neutrophils Relative %: 5 %
Platelet Count: 257 10*3/uL (ref 150–400)
RBC: 3.41 MIL/uL — ABNORMAL LOW (ref 3.87–5.11)
RDW: 14 % (ref 11.5–15.5)
Smear Review: NORMAL
WBC Count: 99.2 10*3/uL (ref 4.0–10.5)
nRBC: 0 % (ref 0.0–0.2)

## 2022-05-23 LAB — LACTATE DEHYDROGENASE: LDH: 130 U/L (ref 98–192)

## 2022-05-23 NOTE — Progress Notes (Unsigned)
[facility] [phone]   [phone]  PROGRESS NOTE  Patient Care Team: Deland Pretty, MD as PCP - General (Internal Medicine) Jerline Pain, MD as PCP - Cardiology (Cardiology) Milus Banister, MD as Attending Physician (Gastroenterology) Megan Salon, MD as Consulting Physician (Gynecology) Donnie Mesa, MD as Consulting Physician (General Surgery) Noemi Chapel, NP as Nurse Practitioner Rod Can, MD as Consulting Physician (Orthopedic Surgery) Latanya Maudlin, MD as Consulting Physician (Orthopedic Surgery) Ria Clock, MD as Attending Physician (Radiology) Adele Dan, MD as Referring Physician (Orthopedic Surgery) Orson Slick, MD as Consulting Physician (Hematology and Oncology)  Hematological/Oncological History # CLL Rai Stage 1  06/04/2021: last visit with Dr. Jana Hakim. Detailed history of his care history noted below.  09/18/2021: WBC 43.3, Hgb 13.0, MCV 87.1, Plt 275 09/23/2021: establish care with Dr. Lorenso Courier. WBC 30.1, Hgb 12.3, MCV 87.8, Plt 241 03/11/2022: WBC 57.4, Hgb 11.2, MCV 88.3, Plt 181  04/29/2022: Started acalabrutinib 100 mg PO twice daily  Interval History:  [name] [name] 76 y.o. 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 The patient's last visit was on 04/18/2022. In the interim since the last visit she started acalbrutinib therapy.   On exam today [name] [name] is accompanied by her daughter. She reports having fatigue but can complete all her ADLs on her own. She continue to struggle with chronic back pain. She denies any appetite changes or weight loss. She still has a throbbing sensation without a headache. She denies nausea, vomiting or abdominal pain. She had two episodes of diarrhea that resolved on its own. She denies easy bruising or signs of bleeding. She denies fevers, chills, sweats, shortness of breath, chest pain  or cough.She has no other complaints. Rest of 10 point ROS is listed below.   MEDICAL HISTORY:  Past Medical History:  Diagnosis Date   Alopecia    Anxiety    Arthritis    "spine" (07/28/2013)   Breast cancer ([facility]) 1997; 2014   right   CAP (community acquired pneumonia)    admission 06-04-2013, failed outpatient   Chronic back pain    "lower back and upper neck" (07/28/2013)   CLL (chronic lymphocytic leukemia) ([facility]) 05/2013   DDD (degenerative disc disease)    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 ear    Depression    Diverticulosis    Elevated LFTs    Fibromyalgia 1978   GERD (gastroesophageal reflux disease)    Hematuria    History of syncope    episode 2008--  no recurrence since   HLD (hyperlipidemia)    Hypothyroidism    Kidney stones 2014   Plantar fasciitis    Ruptured disk    one in neck and two in back   S/P chemotherapy, time since greater than 12 weeks    Sinus tachycardia    mild resting   Skin cancer    nose   Stool incontinence    "at times recently"     SURGICAL HISTORY: Past Surgical History:  Procedure Laterality Date   ANTERIOR LATERAL LUMBAR FUSION 4 LEVELS Left 04/25/2016   Procedure: LEFT LUMBAR ONE-TWO, LUMBAR TWO-THREE, LUMBAR THREE-FOUR, LUMBAR FOUR-FIVE ANTERIOR LATERAL LUMBAR FUSION;  Surgeon: Earnie Larsson, MD;  Location: [facility];  Service: Neurosurgery;  Laterality: Left;   APPENDECTOMY  0938   APPLICATION OF ROBOTIC ASSISTANCE FOR SPINAL PROCEDURE N/A 04/06/2017   Procedure: APPLICATION OF ROBOTIC ASSISTANCE FOR SPINAL  PROCEDURE;  Surgeon: Earnie Larsson, MD;  Location: [facility];  Service: Neurosurgery;  Laterality: N/A;   BREAST BIOPSY Right 2014   BREAST LUMPECTOMY Right 1997   BREAST LUMPECTOMY WITH AXILLARY LYMPH NODE DISSECTION Right 1997   CATARACT EXTRACTION, BILATERAL  2019   Dr. Kathlen Mody   CYSTOSCOPY WITH RETROGRADE PYELOGRAM, URETEROSCOPY AND STENT PLACEMENT Bilateral 06/17/2013   Procedure: CYSTOSCOPY WITH RETROGRADE PYELOGRAM, URETEROSCOPY AND LEFT  DOUBLE  J STENT PLACEMENT RIGHT URETERAL HOLMIIUM LASER AND DOUBLE J STENT ;  Surgeon: Hanley Ben, MD;  Location: [facility];  Service: Urology;  Laterality: Bilateral;   HOLMIUM LASER APPLICATION Bilateral 55/20/8022   Procedure: HOLMIUM LASER APPLICATION;  Surgeon: Hanley Ben, MD;  Location: [facility];  Service: Urology;  Laterality: Bilateral;   LITHOTRIPSY Left 06/2013   LUMBAR EPIDURAL INJECTION     has had 7 injections   PORT-A-CATH REMOVAL Left 10/03/2014   Procedure: REMOVAL PORT-A-CATH;  Surgeon: Donnie Mesa, MD;  Location: [facility];  Service: General;  Laterality: Left;   PORTACATH PLACEMENT Left 07/28/2013   Procedure: ATTEMPTED INSERTION PORT-A-CATH;  Surgeon: Imogene Burn. Georgette [facility], MD;  Location: [facility];  Service: General;  Laterality: Left;   POSTERIOR LUMBAR FUSION 4 LEVEL N/A 04/25/2016   Procedure: LUMBAR FIVE-SACRAL ONE POSTERIOR LUMBAR INTERBODY FUSION, THORACIC NINE-SACRAL ONE POSTERIOR LATERAL ARTHRODESIS WITH PEDICLE SCREWS;  Surgeon: Earnie Larsson, MD;  Location: [facility];  Service: Neurosurgery;  Laterality: N/A;   RETINAL DETACHMENT SURGERY Right 2013   ROBOTIC ASSITED PARTIAL NEPHRECTOMY Right 02/05/2015   Procedure: ROBOTIC ASSITED PARTIAL NEPHRECTOMY;  Surgeon: Raynelle Bring, MD;  Location: [facility];  Service: Urology;  Laterality: Right;   SIMPLE MASTECTOMY WITH AXILLARY SENTINEL NODE BIOPSY Right 07/28/2013   Procedure: RIGHT TOTAL  MASTECTOMY;  Surgeon: Imogene Burn. Georgette [facility], MD;  Location: [facility];  Service: General;  Laterality: Right;   TONSILLECTOMY  AGE 57   TOTAL ABDOMINAL HYSTERECTOMY W/ BILATERAL SALPINGOOPHORECTOMY  1997    SOCIAL HISTORY: Social History   Socioeconomic History   Marital status: Married    Spouse name: [name] [name]   Number of children: 1   Years of education: Not on file   Highest education level: Not on file  Occupational History   Occupation: homemaker  Tobacco Use   Smoking status: Never    Smokeless tobacco: Never  Vaping Use   Vaping Use: Never used  Substance and Sexual Activity   Alcohol use: No   Drug use: No   Sexual activity: Not Currently    Partners: Male    Birth control/protection: Surgical    Comment: TAH/BSO  Other Topics Concern   Not on file  Social History Narrative   Not on file   Social Determinants of Health   Financial Resource Strain: Not on file  Food Insecurity: Not on file  Transportation Needs: Not on file  Physical Activity: Not on file  Stress: Not on file  Social Connections: Not on file  Intimate Partner Violence: Not on file    FAMILY HISTORY: Family History  Problem Relation Age of Onset   Heart disease Maternal Grandfather    Diabetes Maternal Grandfather    Colon cancer Maternal Aunt 25   Brain cancer Paternal Grandmother        dx in 90s   Dementia Mother    Diabetes Mother    Osteoporosis Mother    Diabetes Maternal Aunt    Prostate cancer Father 65   Bipolar disorder Maternal Aunt  Stroke Maternal Aunt    Stomach cancer Paternal Uncle        dx in late 6s    ALLERGIES:  is allergic to lasix [furosemide], lithium, sulfa antibiotics, trazodone and nefazodone, and lyrica [pregabalin].  MEDICATIONS:  Current Outpatient Medications  Medication Sig Dispense Refill   acalabrutinib (CALQUENCE) 100 MG capsule Take 100 mg by mouth 2 (two) times daily.     acalabrutinib maleate (CALQUENCE) 100 MG tablet Take 1 tablet by mouth 2 (two) times daily. 60 tablet 1   allopurinol (ZYLOPRIM) 300 MG tablet TAKE 1 TABLET(300 MG) BY MOUTH DAILY 90 tablet 4   amitriptyline (ELAVIL) 100 MG tablet Take 100 mg by mouth at bedtime.     Apoaequorin (PREVAGEN PO) Take by mouth daily.     aspirin 81 MG EC tablet Take 1 tablet by mouth daily.     brexpiprazole (REXULTI) 1 MG TABS tablet Take 1 mg by mouth daily.     buPROPion (WELLBUTRIN XL) 300 MG 24 hr tablet Take 300 mg by mouth daily.     CALCIUM CITRATE PO Take 1 tablet by mouth  daily.     Cholecalciferol (VITAMIN D3) 50 MCG (2000 UT) TABS Take 1 tablet by mouth daily.     Cyanocobalamin (VITAMIN B12) 1000 MCG TBCR Take 1 tablet by mouth daily.     DULoxetine (CYMBALTA) 60 MG capsule Take 60 mg by mouth daily.     gabapentin (NEURONTIN) 100 MG capsule Take 100 mg by mouth at bedtime.     GARLIC PO Take 1 tablet by mouth daily.     levothyroxine (SYNTHROID, LEVOTHROID) 88 MCG tablet Take 88 mcg by mouth daily before breakfast.  4   lidocaine (XYLOCAINE) 2 % solution Use as directed 15 mLs in the mouth or throat every 4 (four) hours as needed for mouth pain. 100 mL 1   Multiple Vitamins-Minerals (WOMENS 50+ MULTI VITAMIN/MIN) TABS Take 1 tablet by mouth daily.     omeprazole (PRILOSEC) 40 MG capsule TAKE 1 CAPSULE(40 MG) BY MOUTH DAILY AFTER BREAKFAST 90 capsule 0   rosuvastatin (CRESTOR) 5 MG tablet TAKE 1 TABLET(5 MG) BY MOUTH DAILY 90 tablet 2   UNABLE TO FIND Med Name: metatrim     zinc gluconate 50 MG tablet Take 50 mg by mouth daily.     Meloxicam 15 MG TBDP Take 1 tablet by mouth daily as needed. (Patient not taking: Reported on 05/23/2022) 30 tablet 3   No current facility-administered medications for this visit.    REVIEW OF SYSTEMS:   Constitutional: ( - ) fevers, ( - )  chills , ( - ) night sweats Eyes: ( - ) blurriness of vision, ( - ) double vision, ( - ) watery eyes Ears, nose, mouth, throat, and face: ( - ) mucositis, ( - ) sore throat Respiratory: ( - ) cough, ( - ) dyspnea, ( - ) wheezes Cardiovascular: ( - ) palpitation, ( - ) chest discomfort, ( - ) lower extremity swelling Gastrointestinal:  ( - ) nausea, ( - ) heartburn, ( - ) change in bowel habits Skin: ( - ) abnormal skin rashes Lymphatics: ( - ) new lymphadenopathy, ( - ) easy bruising Neurological: ( - ) numbness, ( - ) tingling, ( - ) new weaknesses Behavioral/Psych: ( - ) mood change, ( - ) new changes  All other systems were reviewed with the patient and are negative.  PHYSICAL  EXAMINATION:  Vitals:   05/23/22 1315  BP: Marland Kitchen)  133/59  Pulse: (!) 164  Resp: 16  Temp: (!) 97.3 F (36.3 C)  SpO2: 98%   Filed Weights   05/23/22 1315  Weight: 157 lb 14.4 oz (71.6 kg)    GENERAL: well appearing elderly Caucasian female. alert, no distress and comfortable SKIN:  skin color, texture, turgor are normal, no rashes or significant lesions EYES: conjunctiva are pink and non-injected, sclera clear NECK: supple, non-tender LYMPH:  no palpable lymphadenopathy in the cervical, axillary or inguinal LUNGS: clear to auscultation and percussion with normal breathing effort HEART: regular rate & rhythm and no murmurs and no lower extremity edema Musculoskeletal: no cyanosis of digits and no clubbing  PSYCH: alert & oriented x 3, fluent speech NEURO: no focal motor/sensory deficits  LABORATORY DATA:  I have reviewed the data as listed    Latest Ref Rng & Units 05/23/2022   12:56 PM 05/09/2022   12:59 PM 04/09/2022    1:38 PM  CBC  WBC 4.0 - 10.5 K/uL 99.2  123.1  66.7   Hemoglobin 12.0 - 15.0 g/dL 9.9  11.1  11.2   Hematocrit 36.0 - 46.0 % 31.0  35.5  33.8   Platelets 150 - 400 K/uL 257  205  161        Latest Ref Rng & Units 05/23/2022   12:56 PM 05/09/2022   12:59 PM 04/09/2022    1:38 PM  CMP  Glucose 70 - 99 mg/dL 101  73  127   BUN 8 - 23 mg/dL '10  18  15   ' Creatinine 0.44 - 1.00 mg/dL 0.78  1.06  1.07   Sodium 135 - 145 mmol/L 125  127  131   Potassium 3.5 - 5.1 mmol/L 5.1  5.3  4.8   Chloride 98 - 111 mmol/L 93  96  102   CO2 22 - 32 mmol/L '28  24  22   ' Calcium 8.9 - 10.3 mg/dL 9.0  9.6  9.6   Total Protein 6.5 - 8.1 g/dL 6.5  6.9  6.8   Total Bilirubin 0.3 - 1.2 mg/dL 0.5  0.3  0.5   Alkaline Phos 38 - 126 U/L 70  74  81   AST 15 - 41 U/L '22  29  31   ' ALT 0 - 44 U/L '14  19  22     ' RADIOGRAPHIC STUDIES: No results found.  ASSESSMENT & PLAN [name] [name] 76 y.o. female with medical history significant for CLL and remote ER+ breast cancer who  presents for a follow up visit.  History Adapted from Dr. Virgie Dad last note:   (1) status post right breast lumpectomy and axillary lymph node dissection in 1997 for a stage I breast cancer, treated with adjuvant radiation and tamoxifen for 5 years   (2) status post right breast lower outer quadrant biopsy 07/13/2013 for a clinical T1c N0, stage IA invasive ductal carcinoma, grade 2, estrogen receptor 100% positive, progesterone receptor 13% positive, with an MIB-1 of 33%, and HER-2 amplification by CISH with a HER2/CEP 17 ratio of 3.19, and an average HER-2 copy number per cell of 4.15   (3) status post right mastectomy 07/28/2013 for a pT1c pN0, stage IA invasive ductal carcinoma, grade 3, with close but negative margins. Prognostic panel was not repeated             (a) the patient met with Dr. Harlow Mares and has decided against reconstruction   (4) completed weekly paclitaxel x12 12/06/2913, with trastuzumab/ pertuzumab every  3 weeks; pertuzumab was held with third dose on 11/01/2013 due to diarrhea, tried a half dose on cycle 4 again with diarrhea developing   (5) trastuzumab (started 09/20/2013) continued for 1 year, last dose 09/21/2014;             (a) final echocardiogram 09/07/2014 showed an ejection fraction of 55-60%   (6) anastrozole started May 2015; completed April 2020             (a) bone density 12/15/2013 normal             (b) bone density 02/01/2016 at [facility] was normal with a T score of -1.0     OTHER PROBLEMS: (a) History of chronic lymphoid leukemia diagnosed by flow cytometry 06/29/2013, the cells being CD5, CD20 and CD23 positive, CD10 negative.              (1) right renal mass resected on 02/05/15, consisting of atypical lymphoid proliferation composed of monotonous small lymphocytes              (2) Anemia with a normal MCV and normal ferritin-- B-12 and folate normal; stable             (3)  ibrutinib 280 milligrams daily started 03/26/2019, discontinued 06/03/2019  because of concerns regarding dosing             (4) rituximab weekly x8 started 06/15/2019 completed December 2020             (5) maintenance rituximab (Q 2 months) started 10/25/2019, continued through 08/03/2020   # CLL Rai Stage I --Due to rapid doubling time of white blood cell count from 28.3 on 10/07/2021 to 66.7 on 04/09/2022, recommendation was to initiate therapy.  Discussed options with patient she was agreeable to pursuing acalabrutinib 100 mg twice daily, started on 04/29/2022 --Last imaging of the abdomen in 2022 showed no evidence of splenomegaly --Labs today show improvement of leukocytosis after initial spike with starting acalabrutinib. WBC 99.2, ALC 92.6. There is mild anemia with Hgb 9.9.  Creatinine and LFTs normal.  --Recommend to continue with no dose modifications.  --RTC in 2 weeks with labs.   #Head throbbing: --Patient reports throbbing sensation without headaches. Likely secondary to medication.  --Advised that headaches respond well to caffeine and trying to drink a caffeinated beverage. --Monitor for now and advised to follow up if symptoms worsen.  # ER + Breast Cancer in Survivorship -- Continue yearly mammograms. --last mammogram on 05/30/2021.  --Currently on survivorship.   No orders of the defined types were placed in this encounter.   All questions were answered. The patient knows to call the clinic with any problems, questions or concerns.  I have spent a total of 30 minutes minutes of face-to-face and non-face-to-face time, preparing to see the patient, performing a medically appropriate examination, counseling and educating the patient, ordering tests/procedures, documenting clinical information in the electronic health record, and care coordination.   Dede Query PA-C Dept of Hematology and [place] at [facility] Phone: [phone]   05/23/2022 3:14 PM

## 2022-05-23 NOTE — Telephone Encounter (Signed)
CRITICAL VALUE STICKER  CRITICAL VALUE:   WBC  99.2  RECEIVER (on-site recipient of call):  Rondel Baton, [place] NOTIFIED: 13:12  MESSENGER (representative from lab):  Lelan Pons  MD NOTIFIED: Charlies Silvers  TIME OF NOTIFICATION: 13:14

## 2022-05-26 ENCOUNTER — Encounter: Payer: Self-pay | Admitting: Oncology

## 2022-05-27 ENCOUNTER — Encounter: Payer: Self-pay | Admitting: Physical Medicine and Rehabilitation

## 2022-05-27 ENCOUNTER — Encounter
Payer: Medicare Other | Attending: Physical Medicine and Rehabilitation | Admitting: Physical Medicine and Rehabilitation

## 2022-05-27 VITALS — BP 120/72 | HR 80 | Ht 63.0 in | Wt 156.4 lb

## 2022-05-27 DIAGNOSIS — C9110 Chronic lymphocytic leukemia of B-cell type not having achieved remission: Secondary | ICD-10-CM | POA: Diagnosis not present

## 2022-05-27 DIAGNOSIS — M546 Pain in thoracic spine: Secondary | ICD-10-CM | POA: Diagnosis not present

## 2022-05-27 MED ORDER — DICLOFENAC SODIUM 1 % EX GEL: 2.0000 g | Freq: Four times a day (QID) | CUTANEOUS | 3 refills | Status: DC

## 2022-05-27 MED ORDER — TOPIRAMATE 25 MG PO TABS: 25.0000 mg | ORAL_TABLET | Freq: Every evening | ORAL | 3 refills | Status: DC

## 2022-05-27 NOTE — Progress Notes (Signed)
Subjective:    Patient ID: [name] [name], female    DOB: 12/26/1945, 76 y.o.   MRN: [id number]  HPI [name] is 76 year old woman who presents to for follow-up of upper back pain.  1) Upper back pain -it is present in her upper thoracic spine and radiates up into her neck -her neurosurgeon ordered a CT  -it hurts to keep her spine straight -discussed CBT -she went to [facility] and received a shot of Toradol but that didn't help -she takes advil. -sleeping good -feels tight -pain can be 10/10 at times.  -meloxicam did not help -did 8 PT sessions ad this did not help.  -she has an appointment on Thursday with [facility] for injections -she has hardware from prior surgery with Dr. 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   Pain Inventory Average Pain 10 Pain Right Now 5 My pain is dull and aching  In the last 24 hours, has pain interfered with the following? General activity 9 Relation with others 5 Enjoyment of life 8 What TIME of day is your pain at its worst? morning , daytime, evening, and night Sleep (in general) Fair  Pain is worse with: walking, standing, some activites, and all activities Pain improves with: rest Relief from Meds:  na  walk without assistance how many minutes can you walk? 3 ability to climb steps?  yes do you drive?  yes  retired I need assistance with the following:  household duties  weakness trouble walking depression anxiety loss of taste or smell  New pt  New pt    Family History  Problem Relation Age of Onset   Heart disease Maternal Grandfather    Diabetes Maternal Grandfather    Colon cancer Maternal Aunt 67   Brain cancer Paternal Grandmother        dx in 3s   Dementia Mother    Diabetes Mother    Osteoporosis Mother    Diabetes Maternal Aunt    Prostate cancer Father 58    Bipolar disorder Maternal Aunt    Stroke Maternal Aunt    Stomach cancer Paternal Uncle        dx in late 66s   Social History   Socioeconomic History   Marital status: Married    Spouse name: Arnette Norris   Number of children: 1   Years of education: Not on file   Highest education level: Not on file  Occupational History   Occupation: homemaker  Tobacco Use   Smoking status: Never   Smokeless tobacco: Never  Vaping Use   Vaping Use: Never used  Substance and Sexual Activity   Alcohol use: No   Drug use: No   Sexual activity: Not Currently    Partners: Male    Birth control/protection: Surgical    Comment: TAH/BSO  Other Topics Concern   Not on file  Social History Narrative   Not on file   Social Determinants of Health   Financial Resource Strain: Not on file  Food Insecurity: Not on file  Transportation Needs: Not on file  Physical Activity: Not on file  Stress: Not on file  Social Connections: Not on file   Past Surgical History:  Procedure Laterality Date   ANTERIOR LATERAL LUMBAR FUSION 4 LEVELS Left 04/25/2016   Procedure: LEFT LUMBAR ONE-TWO, LUMBAR TWO-THREE, LUMBAR THREE-FOUR, LUMBAR  FOUR-FIVE ANTERIOR LATERAL LUMBAR FUSION;  Surgeon: Earnie Larsson, MD;  Location: [facility];  Service: Neurosurgery;  Laterality: Left;   APPENDECTOMY  3244   APPLICATION OF ROBOTIC ASSISTANCE FOR SPINAL PROCEDURE N/A 04/06/2017   Procedure: APPLICATION OF ROBOTIC ASSISTANCE FOR SPINAL PROCEDURE;  Surgeon: Earnie Larsson, MD;  Location: [place];  Service: Neurosurgery;  Laterality: N/A;   BREAST BIOPSY Right 2014   BREAST LUMPECTOMY Right 1997   BREAST LUMPECTOMY WITH AXILLARY LYMPH NODE DISSECTION Right 1997   CATARACT EXTRACTION, BILATERAL  2019   Dr. Kathlen Mody   CYSTOSCOPY WITH RETROGRADE PYELOGRAM, URETEROSCOPY AND STENT PLACEMENT Bilateral 06/17/2013   Procedure: CYSTOSCOPY WITH RETROGRADE PYELOGRAM, URETEROSCOPY AND LEFT DOUBLE  J STENT PLACEMENT RIGHT URETERAL HOLMIIUM LASER AND  DOUBLE J STENT ;  Surgeon: Hanley Ben, MD;  Location: [place];  Service: Urology;  Laterality: Bilateral;   HOLMIUM LASER APPLICATION Bilateral 08/27/7251   Procedure: HOLMIUM LASER APPLICATION;  Surgeon: Hanley Ben, MD;  Location: [place];  Service: Urology;  Laterality: Bilateral;   LITHOTRIPSY Left 06/2013   LUMBAR EPIDURAL INJECTION     has had 7 injections   PORT-A-CATH REMOVAL Left 10/03/2014   Procedure: REMOVAL PORT-A-CATH;  Surgeon: Donnie Mesa, MD;  Location: [place];  Service: General;  Laterality: Left;   PORTACATH PLACEMENT Left 07/28/2013   Procedure: ATTEMPTED INSERTION PORT-A-CATH;  Surgeon: Imogene Burn. Georgette Dover, MD;  Location: [place];  Service: General;  Laterality: Left;   POSTERIOR LUMBAR FUSION 4 LEVEL N/A 04/25/2016   Procedure: LUMBAR FIVE-SACRAL ONE POSTERIOR LUMBAR INTERBODY FUSION, THORACIC NINE-SACRAL ONE POSTERIOR LATERAL ARTHRODESIS WITH PEDICLE SCREWS;  Surgeon: Earnie Larsson, MD;  Location: [place] [facility];  Service: Neurosurgery;  Laterality: N/A;   RETINAL DETACHMENT SURGERY Right 2013   ROBOTIC ASSITED PARTIAL NEPHRECTOMY Right 02/05/2015   Procedure: ROBOTIC ASSITED PARTIAL NEPHRECTOMY;  Surgeon: Raynelle Bring, MD;  Location: [facility];  Service: Urology;  Laterality: Right;   SIMPLE MASTECTOMY WITH AXILLARY SENTINEL NODE BIOPSY Right 07/28/2013   Procedure: RIGHT TOTAL  MASTECTOMY;  Surgeon: Imogene Burn. Tsuei, MD;  Location: [place];  Service: General;  Laterality: Right;   TONSILLECTOMY  AGE 64   TOTAL ABDOMINAL HYSTERECTOMY W/ BILATERAL SALPINGOOPHORECTOMY  1997   Past Medical History:  Diagnosis Date   Alopecia    Anxiety    Arthritis    "spine" (07/28/2013)   Breast cancer ([place]) 1997; 2014   right   CAP (community acquired pneumonia)    admission 06-04-2013, failed outpatient   Chronic back pain    "lower back and upper neck" (07/28/2013)   CLL (chronic lymphocytic leukemia) ([place]) 05/2013   DDD  (degenerative disc disease)    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 ear    Depression    Diverticulosis    Elevated LFTs    Fibromyalgia 1978   GERD (gastroesophageal reflux disease)    Hematuria    History of syncope    episode 2008--  no recurrence since   HLD (hyperlipidemia)    Hypothyroidism    Kidney stones 2014   Plantar fasciitis    Ruptured disk    one in neck and two in back   S/P chemotherapy, time since greater than 12 weeks    Sinus tachycardia    mild resting   Skin cancer    nose   Stool incontinence    "at times recently"    BP 120/72   Pulse 80   Ht '5\' 3"'$  (1.6 m)  Wt 156 lb 6.4 oz (70.9 kg)   LMP 08/26/1995   SpO2 98%   BMI 27.71 kg/m   Opioid Risk Score:   Fall Risk Score:  `1  Depression screen PHQ 2/9     04/07/2022    2:17 PM 10/10/2020    2:30 PM 01/30/2016    3:10 PM 11/23/2015    4:11 PM 10/05/2015    1:46 PM 09/25/2015   11:26 AM 07/13/2015    2:30 PM  Depression screen PHQ 2/9  Decreased Interest 3 0 0 0 0 0 0  Down, Depressed, Hopeless 2 0 0 0 0 0 0  PHQ - 2 Score 5 0 0 0 0 0 0  Altered sleeping 2        Tired, decreased energy 3        Change in appetite 1        Feeling bad or failure about yourself  3        Trouble concentrating 3        Moving slowly or fidgety/restless 2        Suicidal thoughts 0        PHQ-9 Score 19        Difficult doing work/chores Somewhat difficult           Review of Systems  Constitutional: Negative.   HENT: Negative.    Eyes: Negative.   Respiratory: Negative.    Cardiovascular: Negative.   Gastrointestinal: Negative.   Endocrine: Negative.   Genitourinary: Negative.   Musculoskeletal:  Positive for back pain, gait problem and neck pain.  Skin: Negative.   Allergic/Immunologic: Negative.   Neurological:  Positive for weakness.  Hematological: Negative.   Psychiatric/Behavioral:  Positive for dysphoric mood.   All other systems reviewed and are negative.     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  -recommended application of blue emu oil -prescribed voltaren gel.  -discussed allergy testing -discussed topamax -prescribed meloxicam '15mg'$  daily for 2 weeks, can stop earlier if pain resolves, advised to take with food, advised to not take other NSAIDs with this medication, advised me to call in 1 day if ineffective -Discussed current symptoms of pain and history of pain.  -Discussed benefits of exercise in reducing pain. -Discussed following foods that may reduce pain: 1) Ginger (especially studied for arthritis)- reduce leukotriene production to decrease inflammation 2) Blueberries- high in phytonutrients that decrease inflammation 3) Salmon- marine omega-3s reduce joint swelling and pain 4) Pumpkin seeds- reduce inflammation 5) dark chocolate- reduces inflammation 6) turmeric- reduces inflammation 7) tart cherries - reduce pain and stiffness 8) extra virgin olive oil - its compound olecanthal helps to block prostaglandins  9) chili peppers- can be eaten or applied topically via capsaicin 10) mint- helpful for headache, muscle aches, joint pain, and itching 11) garlic- reduces inflammation  Link to further information on diet for chronic pain: [email or web address]   2) Overweight -Educated that current weight is 156 lbs and current BMI is 27.71 -Educated regarding health benefits of weight loss- for pain, general health, chronic disease prevention, immune health, mental  health.  -Will  monitor weight every visit.  -Consider Ro[place] tea daily.  -Discussed the benefits of intermittent fasting. -Discussed foods that can assist in weight loss: 1) leafy greens- high in fiber and nutrients 2) dark chocolate- improves metabolism (if prefer sweetened, best to sweeten with honey instead of sugar).  3) cruciferous vegetables- high in fiber and protein 4) full fat yogurt: high in healthy fat, protein, calcium, and probiotics 5) apples- high in a variety of phytochemicals 6) nuts- high in fiber and protein that increase feelings of fullness 7) grapefruit: rich in nutrients, antioxidants, and fiber (not to be taken with anticoagulation) 8) beans- high in protein and fiber 9) salmon- has high quality protein and healthy fats 10) green tea- rich in polyphenols 11) eggs- rich in choline and vitamin D 12) tuna- high protein, boosts metabolism 13) avocado- decreases visceral abdominal fat 14) chicken (pasture raised): high in protein and iron 15) blueberries- reduce abdominal fat and cholesterol 16) whole grains- decreases calories retained during digestion, speeds metabolism 17) chia seeds- curb appetite 18) chilies- increases fat metabolism  -Discussed supplements that can be used:  1) Metatrim '400mg'$  BID 30 minutes before breakfast and dinner  2) Sphaeranthus indicus and Garcinia mangostana (combinations of these and #1 can be found in capsicum and zychrome  3) green coffee bean extract '400mg'$  twice per day or Irvingia (african mango) 150 to '300mg'$  twice per day.  3) CLL -discussed her current treatment -discussed ketogenic diet -recommended broccoli sprouts

## 2022-05-27 NOTE — Patient Instructions (Addendum)
Blu emu oil  Extracorporeal shockwave therapy

## 2022-05-29 ENCOUNTER — Telehealth: Payer: Self-pay | Admitting: *Deleted

## 2022-05-29 DIAGNOSIS — M546 Pain in thoracic spine: Secondary | ICD-10-CM | POA: Diagnosis not present

## 2022-05-29 NOTE — Telephone Encounter (Signed)
Receiv ed call from [facility]. Her provider recommends she get a thoracic spine injection of 30 mg Kenalog. He would like to know if he can do those while pt on her current treatment of  Acalabrutinib.  Please advise

## 2022-06-06 ENCOUNTER — Inpatient Hospital Stay: Payer: Medicare Other | Admitting: Hematology and Oncology

## 2022-06-06 ENCOUNTER — Other Ambulatory Visit: Payer: Self-pay | Admitting: Hematology and Oncology

## 2022-06-06 ENCOUNTER — Inpatient Hospital Stay: Payer: Medicare Other | Attending: Hematology and Oncology

## 2022-06-06 ENCOUNTER — Other Ambulatory Visit: Payer: Self-pay

## 2022-06-06 VITALS — BP 134/63 | HR 90 | Temp 98.6°F | Resp 15 | Wt 157.8 lb

## 2022-06-06 DIAGNOSIS — E039 Hypothyroidism, unspecified: Secondary | ICD-10-CM | POA: Insufficient documentation

## 2022-06-06 DIAGNOSIS — C9110 Chronic lymphocytic leukemia of B-cell type not having achieved remission: Secondary | ICD-10-CM | POA: Diagnosis not present

## 2022-06-06 DIAGNOSIS — Z79899 Other long term (current) drug therapy: Secondary | ICD-10-CM | POA: Insufficient documentation

## 2022-06-06 DIAGNOSIS — C50511 Malignant neoplasm of lower-outer quadrant of right female breast: Secondary | ICD-10-CM

## 2022-06-06 DIAGNOSIS — Z170 Estrogen receptor positive status [ER+]: Secondary | ICD-10-CM

## 2022-06-06 DIAGNOSIS — Z9011 Acquired absence of right breast and nipple: Secondary | ICD-10-CM | POA: Insufficient documentation

## 2022-06-06 DIAGNOSIS — Z853 Personal history of malignant neoplasm of breast: Secondary | ICD-10-CM | POA: Diagnosis not present

## 2022-06-06 LAB — CMP (CANCER CENTER ONLY)
ALT: 13 U/L (ref 0–44)
AST: 18 U/L (ref 15–41)
Albumin: 4.2 g/dL (ref 3.5–5.0)
Alkaline Phosphatase: 64 U/L (ref 38–126)
Anion gap: 5 (ref 5–15)
BUN: 11 mg/dL (ref 8–23)
CO2: 25 mmol/L (ref 22–32)
Calcium: 8.5 mg/dL — ABNORMAL LOW (ref 8.9–10.3)
Chloride: 92 mmol/L — ABNORMAL LOW (ref 98–111)
Creatinine: 1.04 mg/dL — ABNORMAL HIGH (ref 0.44–1.00)
GFR, Estimated: 56 mL/min — ABNORMAL LOW (ref >60)
Glucose, Bld: 116 mg/dL — ABNORMAL HIGH (ref 70–99)
Potassium: 4.6 mmol/L (ref 3.5–5.1)
Sodium: 122 mmol/L — ABNORMAL LOW (ref 135–145)
Total Bilirubin: 0.4 mg/dL (ref 0.3–1.2)
Total Protein: 6.1 g/dL — ABNORMAL LOW (ref 6.5–8.1)

## 2022-06-06 LAB — CBC WITH DIFFERENTIAL (CANCER CENTER ONLY)
Abs Immature Granulocytes: 0.1 10*3/uL — ABNORMAL HIGH (ref 0.00–0.07)
Basophils Absolute: 0.1 10*3/uL (ref 0.0–0.1)
Basophils Relative: 0 %
Eosinophils Absolute: 0.2 10*3/uL (ref 0.0–0.5)
Eosinophils Relative: 0 %
HCT: 29.3 % — ABNORMAL LOW (ref 36.0–46.0)
Hemoglobin: 9.5 g/dL — ABNORMAL LOW (ref 12.0–15.0)
Immature Granulocytes: 0 %
Lymphocytes Relative: 92 %
Lymphs Abs: 65 10*3/uL — ABNORMAL HIGH (ref 0.7–4.0)
MCH: 29.7 pg (ref 26.0–34.0)
MCHC: 32.4 g/dL (ref 30.0–36.0)
MCV: 91.6 fL (ref 80.0–100.0)
Monocytes Absolute: 0.7 10*3/uL (ref 0.1–1.0)
Monocytes Relative: 1 %
Neutro Abs: 4.8 10*3/uL (ref 1.7–7.7)
Neutrophils Relative %: 7 %
Platelet Count: 218 10*3/uL (ref 150–400)
RBC: 3.2 MIL/uL — ABNORMAL LOW (ref 3.87–5.11)
RDW: 13.9 % (ref 11.5–15.5)
Smear Review: NORMAL
WBC Count: 70.8 10*3/uL (ref 4.0–10.5)
nRBC: 0 % (ref 0.0–0.2)

## 2022-06-06 LAB — LACTATE DEHYDROGENASE: LDH: 124 U/L (ref 98–192)

## 2022-06-06 NOTE — Progress Notes (Signed)
[place] [phone]   [phone]  PROGRESS NOTE  Patient Care Team: Deland Pretty, MD as PCP - General (Internal Medicine) Jerline Pain, MD as PCP - Cardiology (Cardiology) Milus Banister, MD as Attending Physician (Gastroenterology) Megan Salon, MD as Consulting Physician (Gynecology) Donnie Mesa, MD as Consulting Physician (General Surgery) Noemi Chapel, NP as Nurse Practitioner Rod Can, MD as Consulting Physician (Orthopedic Surgery) Latanya Maudlin, MD as Consulting Physician (Orthopedic Surgery) Ria Clock, MD as Attending Physician (Radiology) Adele Dan, MD as Referring Physician (Orthopedic Surgery) Orson Slick, MD as Consulting Physician (Hematology and Oncology)  Hematological/Oncological History # CLL Rai Stage 1  06/04/2021: last visit with Dr. Jana Hakim. Detailed history of his care history noted below.  09/18/2021: WBC 43.3, Hgb 13.0, MCV 87.1, Plt 275 09/23/2021: establish care with Dr. Lorenso Courier. WBC 30.1, Hgb 12.3, MCV 87.8, Plt 241 03/11/2022: WBC 57.4, Hgb 11.2, MCV 88.3, Plt 181  04/29/2022: Started acalabrutinib 100 mg PO twice daily  Interval History:  [name] [name] 76 y.o. 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 The patient's last visit was on 05/23/2022. In the interim since the last visit she started acalbrutinib therapy.   On exam today [name] [name] is accompanied by her daughter. She reports she has been on acalabrutinib for 5 weeks time.  She reports that the most irritating symptom she has is "pounding in her head".  She reports that it is not painful but is a occasional pounding.  She reports that her mouth feels "funny".  She notes that she does have some issues with occasional watery eyes and puffy eyes.  She reports that she does occasionally have episodes of itching but this subsequently resolved.  She is  not having any bleeding but does easily bruise.  She is also been struggling with some fatigue.  She continues to have back pain and is currently being set up for spinal injections with steroids.  She denies easy bruising or signs of bleeding. She denies fevers, chills, sweats, shortness of breath, chest pain or cough.She has no other complaints. Rest of 10 point ROS is listed below.  Overall she is willing and able to proceed with acalabrutinib therapy at this time.   MEDICAL HISTORY:  Past Medical History:  Diagnosis Date   Alopecia    Anxiety    Arthritis    "spine" (07/28/2013)   Breast cancer ([place]) 1997; 2014   right   CAP (community acquired pneumonia)    admission 06-04-2013, failed outpatient   Chronic back pain    "lower back and upper neck" (07/28/2013)   CLL (chronic lymphocytic leukemia) ([place]) 05/2013   DDD (degenerative disc disease)    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 ear    Depression    Diverticulosis    Elevated LFTs    Fibromyalgia 1978   GERD (gastroesophageal reflux disease)    Hematuria    History of syncope    episode 2008--  no recurrence since   HLD (hyperlipidemia)    Hypothyroidism    Kidney stones 2014   Plantar fasciitis    Ruptured disk    one in neck and two in back   S/P chemotherapy, time since greater than 12 weeks    Sinus tachycardia    mild resting   Skin cancer    nose   Stool incontinence    "at times recently"  SURGICAL HISTORY: Past Surgical History:  Procedure Laterality Date   ANTERIOR LATERAL LUMBAR FUSION 4 LEVELS Left 04/25/2016   Procedure: LEFT LUMBAR ONE-TWO, LUMBAR TWO-THREE, LUMBAR THREE-FOUR, LUMBAR FOUR-FIVE ANTERIOR LATERAL LUMBAR FUSION;  Surgeon: Earnie Larsson, MD;  Location: [facility];  Service: Neurosurgery;  Laterality: Left;   APPENDECTOMY  6433   APPLICATION OF ROBOTIC ASSISTANCE FOR SPINAL PROCEDURE N/A 04/06/2017   Procedure: APPLICATION OF ROBOTIC ASSISTANCE FOR SPINAL PROCEDURE;  Surgeon: Earnie Larsson, MD;  Location: [facility];  Service: Neurosurgery;  Laterality: N/A;   BREAST BIOPSY Right 2014   BREAST LUMPECTOMY Right 1997   BREAST LUMPECTOMY WITH AXILLARY LYMPH NODE DISSECTION Right 1997   CATARACT EXTRACTION, BILATERAL  2019   Dr. Kathlen Mody   CYSTOSCOPY WITH RETROGRADE PYELOGRAM, URETEROSCOPY AND STENT PLACEMENT Bilateral 06/17/2013   Procedure: CYSTOSCOPY WITH RETROGRADE PYELOGRAM, URETEROSCOPY AND LEFT DOUBLE  J STENT PLACEMENT RIGHT URETERAL HOLMIIUM LASER AND DOUBLE J STENT ;  Surgeon: Hanley Ben, MD;  Location: [facility];  Service: Urology;  Laterality: Bilateral;   HOLMIUM LASER APPLICATION Bilateral 29/51/8841   Procedure: HOLMIUM LASER APPLICATION;  Surgeon: Hanley Ben, MD;  Location: [facility];  Service: Urology;  Laterality: Bilateral;   LITHOTRIPSY Left 06/2013   LUMBAR EPIDURAL INJECTION     has had 7 injections   PORT-A-CATH REMOVAL Left 10/03/2014   Procedure: REMOVAL PORT-A-CATH;  Surgeon: Donnie Mesa, MD;  Location: [facility];  Service: General;  Laterality: Left;   PORTACATH PLACEMENT Left 07/28/2013   Procedure: ATTEMPTED INSERTION PORT-A-CATH;  Surgeon: Imogene Burn. Georgette Dover, MD;  Location: [facility];  Service: General;  Laterality: Left;   POSTERIOR LUMBAR FUSION 4 LEVEL N/A 04/25/2016   Procedure: LUMBAR FIVE-SACRAL ONE POSTERIOR LUMBAR INTERBODY FUSION, THORACIC NINE-SACRAL ONE POSTERIOR LATERAL ARTHRODESIS WITH PEDICLE SCREWS;  Surgeon: Earnie Larsson, MD;  Location: [facility];  Service: Neurosurgery;  Laterality: N/A;   RETINAL DETACHMENT SURGERY Right 2013   ROBOTIC ASSITED PARTIAL NEPHRECTOMY Right 02/05/2015   Procedure: ROBOTIC ASSITED PARTIAL NEPHRECTOMY;  Surgeon: Raynelle Bring, MD;  Location: [facility];  Service: Urology;  Laterality: Right;   SIMPLE MASTECTOMY WITH AXILLARY SENTINEL NODE BIOPSY Right 07/28/2013   Procedure: RIGHT TOTAL  MASTECTOMY;  Surgeon: Imogene Burn. Georgette Dover, MD;  Location: [facility];  Service: General;  Laterality: Right;    TONSILLECTOMY  AGE 55   TOTAL ABDOMINAL HYSTERECTOMY W/ BILATERAL SALPINGOOPHORECTOMY  1997    SOCIAL HISTORY: Social History   Socioeconomic History   Marital status: Married    Spouse name: [name] [name]   Number of children: 1   Years of education: Not on file   Highest education level: Not on file  Occupational History   Occupation: homemaker  Tobacco Use   Smoking status: Never   Smokeless tobacco: Never  Vaping Use   Vaping Use: Never used  Substance and Sexual Activity   Alcohol use: No   Drug use: No   Sexual activity: Not Currently    Partners: Male    Birth control/protection: Surgical    Comment: TAH/BSO  Other Topics Concern   Not on file  Social History Narrative   Not on file   Social Determinants of Health   Financial Resource Strain: Not on file  Food Insecurity: Not on file  Transportation Needs: Not on file  Physical Activity: Not on file  Stress: Not on file  Social Connections: Not on file  Intimate Partner Violence: Not on file    FAMILY HISTORY: Family  History  Problem Relation Age of Onset   Heart disease Maternal Grandfather    Diabetes Maternal Grandfather    Colon cancer Maternal Aunt 27   Brain cancer Paternal Grandmother        dx in 91s   Dementia Mother    Diabetes Mother    Osteoporosis Mother    Diabetes Maternal Aunt    Prostate cancer Father 60   Bipolar disorder Maternal Aunt    Stroke Maternal Aunt    Stomach cancer Paternal Uncle        dx in late 7s    ALLERGIES:  is allergic to lasix [furosemide], lithium, sulfa antibiotics, trazodone and nefazodone, and lyrica [pregabalin].  MEDICATIONS:  Current Outpatient Medications  Medication Sig Dispense Refill   acalabrutinib (CALQUENCE) 100 MG capsule Take 100 mg by mouth 2 (two) times daily.     acalabrutinib maleate (CALQUENCE) 100 MG tablet Take 1 tablet by mouth 2 (two) times daily. 60 tablet 1   allopurinol (ZYLOPRIM) 300 MG tablet TAKE 1 TABLET(300 MG) BY MOUTH DAILY  90 tablet 4   amitriptyline (ELAVIL) 100 MG tablet Take 100 mg by mouth at bedtime.     Apoaequorin (PREVAGEN PO) Take by mouth daily.     aspirin 81 MG EC tablet Take 1 tablet by mouth daily.     brexpiprazole (REXULTI) 1 MG TABS tablet Take 1 mg by mouth daily.     buPROPion (WELLBUTRIN XL) 300 MG 24 hr tablet Take 300 mg by mouth daily.     CALCIUM CITRATE PO Take 1 tablet by mouth daily.     Cholecalciferol (VITAMIN D3) 50 MCG (2000 UT) TABS Take 1 tablet by mouth daily.     Cyanocobalamin (VITAMIN B12) 1000 MCG TBCR Take 1 tablet by mouth daily.     diclofenac Sodium (VOLTAREN) 1 % GEL Apply 2 g topically 4 (four) times daily. 2 g 3   DULoxetine (CYMBALTA) 60 MG capsule Take 60 mg by mouth daily.     gabapentin (NEURONTIN) 100 MG capsule Take 100 mg by mouth at bedtime.     GARLIC PO Take 1 tablet by mouth daily.     levothyroxine (SYNTHROID, LEVOTHROID) 88 MCG tablet Take 88 mcg by mouth daily before breakfast.  4   lidocaine (XYLOCAINE) 2 % solution Use as directed 15 mLs in the mouth or throat every 4 (four) hours as needed for mouth pain. 100 mL 1   Multiple Vitamins-Minerals (WOMENS 50+ MULTI VITAMIN/MIN) TABS Take 1 tablet by mouth daily.     omeprazole (PRILOSEC) 40 MG capsule TAKE 1 CAPSULE(40 MG) BY MOUTH DAILY AFTER BREAKFAST 90 capsule 0   rosuvastatin (CRESTOR) 5 MG tablet TAKE 1 TABLET(5 MG) BY MOUTH DAILY 90 tablet 2   topiramate (TOPAMAX) 25 MG tablet Take 1 tablet (25 mg total) by mouth at bedtime. 90 tablet 3   UNABLE TO FIND Med Name: metatrim     zinc gluconate 50 MG tablet Take 50 mg by mouth daily.     No current facility-administered medications for this visit.    REVIEW OF SYSTEMS:   Constitutional: ( - ) fevers, ( - )  chills , ( - ) night sweats Eyes: ( - ) blurriness of vision, ( - ) double vision, ( - ) watery eyes Ears, nose, mouth, throat, and face: ( - ) mucositis, ( - ) sore throat Respiratory: ( - ) cough, ( - ) dyspnea, ( - ) wheezes Cardiovascular:  ( - ) palpitation, ( - )  chest discomfort, ( - ) lower extremity swelling Gastrointestinal:  ( - ) nausea, ( - ) heartburn, ( - ) change in bowel habits Skin: ( - ) abnormal skin rashes Lymphatics: ( - ) new lymphadenopathy, ( - ) easy bruising Neurological: ( - ) numbness, ( - ) tingling, ( - ) new weaknesses Behavioral/Psych: ( - ) mood change, ( - ) new changes  All other systems were reviewed with the patient and are negative.  PHYSICAL EXAMINATION:  Vitals:   06/06/22 1417  BP: 134/63  Pulse: 90  Resp: 15  Temp: 98.6 F (37 C)  SpO2: 100%   Filed Weights   06/06/22 1417  Weight: 157 lb 12.8 oz (71.6 kg)    GENERAL: well appearing elderly Caucasian female. alert, no distress and comfortable SKIN:  skin color, texture, turgor are normal, no rashes or significant lesions EYES: conjunctiva are pink and non-injected, sclera clear NECK: supple, non-tender LYMPH:  no palpable lymphadenopathy in the cervical, axillary or inguinal LUNGS: clear to auscultation and percussion with normal breathing effort HEART: regular rate & rhythm and no murmurs and no lower extremity edema Musculoskeletal: no cyanosis of digits and no clubbing  PSYCH: alert & oriented x 3, fluent speech NEURO: no focal motor/sensory deficits  LABORATORY DATA:  I have reviewed the data as listed    Latest Ref Rng & Units 06/06/2022    1:46 PM 05/23/2022   12:56 PM 05/09/2022   12:59 PM  CBC  WBC 4.0 - 10.5 K/uL 70.8  99.2  123.1   Hemoglobin 12.0 - 15.0 g/dL 9.5  9.9  11.1   Hematocrit 36.0 - 46.0 % 29.3  31.0  35.5   Platelets 150 - 400 K/uL 218  257  205        Latest Ref Rng & Units 06/06/2022    1:46 PM 05/23/2022   12:56 PM 05/09/2022   12:59 PM  CMP  Glucose 70 - 99 mg/dL 116  101  73   BUN 8 - 23 mg/dL _0 Creatinine 0.44 - 1.00 mg/dL 1.04  0.78  1.06   Sodium 135 - 145 mmol/L 122  125  127   Potassium 3.5 - 5.1 mmol/L 4.6  5.1  5.3   Chloride 98 - 111 mmol/L 92  93  96   CO2 22 -  32 mmol/L _1 Calcium 8.9 - 10.3 mg/dL 8.5  9.0  9.6   Total Protein 6.5 - 8.1 g/dL 6.1  6.5  6.9   Total Bilirubin 0.3 - 1.2 mg/dL 0.4  0.5  0.3   Alkaline Phos 38 - 126 U/L 64  70  74   AST 15 - 41 U/L _2 ALT 0 - 44 U/L _3 RADIOGRAPHIC STUDIES: No results found.  ASSESSMENT & PLAN [name] [name] 76 y.o. 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  History Adapted from Dr. Virgie Dad last note:   (1) status post right breast lumpectomy and axillary lymph node dissection in 1997 for a stage I breast cancer, treated with adjuvant radiation and tamoxifen for 5 years   (2) status post right breast lower outer quadrant biopsy 07/13/2013 for a clinical T1c N0, stage IA invasive ductal carcinoma, grade 2, estrogen receptor 100% positive, progesterone receptor 13% positive, with an MIB-1  of 33%, and HER-2 amplification by CISH with a HER2/CEP 17 ratio of 3.19, and an average HER-2 copy number per cell of 4.15   (3) status post right mastectomy 07/28/2013 for a pT1c pN0, stage IA invasive ductal carcinoma, grade 3, with close but negative margins. Prognostic panel was not repeated             (a) the patient met with Dr. Harlow Mares and has decided against reconstruction   (4) completed weekly paclitaxel x12 12/06/2913, with trastuzumab/ pertuzumab every 3 weeks; pertuzumab was held with third dose on 11/01/2013 due to diarrhea, tried a half dose on cycle 4 again with diarrhea developing   (5) trastuzumab (started 09/20/2013) continued for 1 year, last dose 09/21/2014;             (a) final echocardiogram 09/07/2014 showed an ejection fraction of 55-60%   (6) anastrozole started May 2015; completed April 2020             (a) bone density 12/15/2013 normal             (b) bone density 02/01/2016 at [facility] was normal with a T score of -1.0     OTHER PROBLEMS: (a) History of chronic lymphoid  leukemia diagnosed by flow cytometry 06/29/2013, the cells being CD5, CD20 and CD23 positive, CD10 negative.              (1) right renal mass resected on 02/05/15, consisting of atypical lymphoid proliferation composed of monotonous small lymphocytes              (2) Anemia with a normal MCV and normal ferritin-- B-12 and folate normal; stable             (3)  ibrutinib 280 milligrams daily started 03/26/2019, discontinued 06/03/2019 because of concerns regarding dosing             (4) rituximab weekly x8 started 06/15/2019 completed December 2020             (5) maintenance rituximab (Q 2 months) started 10/25/2019, continued through 08/03/2020  (6) 04/29/2022: Started acalabrutinib 100 mg PO twice daily   # CLL Rai Stage I --Due to rapid doubling time of white blood cell count from 28.3 on 10/07/2021 to 66.7 on 04/09/2022, recommendation was to initiate therapy.  Discussed options with patient she was agreeable to pursuing acalabrutinib 100 mg twice daily, started on 04/29/2022 --Last imaging of the abdomen in 2022 showed no evidence of splenomegaly --Labs today show improvement of leukocytosis after initial spike with starting acalabrutinib. WBC 70.8. There is mild anemia with Hgb 9.5.  Creatinine and LFTs normal.  --Recommend to continue with no dose modifications.  --RTC in 4 weeks with labs.   #Head throbbing: --Patient reports throbbing sensation without headaches. Likely secondary to medication.  --Advised that headaches respond well to caffeine and trying to drink a caffeinated beverage. --Monitor for now and advised to follow up if symptoms worsen.  # ER + Breast Cancer in Survivorship -- Continue yearly mammograms. --last mammogram on 05/30/2021.  --Currently on survivorship.  No orders of the defined types were placed in this encounter.  All questions were answered. The patient knows to call the clinic with any problems, questions or concerns.  I have spent a total of 30 minutes  minutes of face-to-face and non-face-to-face time, preparing to see the patient, performing a medically appropriate examination, counseling and educating the patient, ordering tests/procedures, documenting clinical information in the electronic health record, and  care coordination.   Ledell Peoples, MD Department of Hematology/Oncology [place] at [facility] Phone: [phone] Pager: [phone] Email: [name]  06/17/2022 3:29 PM

## 2022-06-09 DIAGNOSIS — Z1231 Encounter for screening mammogram for malignant neoplasm of breast: Secondary | ICD-10-CM | POA: Diagnosis not present

## 2022-06-11 ENCOUNTER — Telehealth: Payer: Self-pay | Admitting: *Deleted

## 2022-06-11 NOTE — Telephone Encounter (Signed)
TCT patient regarding spinal injection. Spoke with her and asked which provider will be doing the injection. She states Dr. Davy Pique. Message sent to him via epic fax.

## 2022-06-16 DIAGNOSIS — F330 Major depressive disorder, recurrent, mild: Secondary | ICD-10-CM | POA: Diagnosis not present

## 2022-06-16 DIAGNOSIS — F411 Generalized anxiety disorder: Secondary | ICD-10-CM | POA: Diagnosis not present

## 2022-06-17 ENCOUNTER — Encounter: Payer: Self-pay | Admitting: Oncology

## 2022-06-17 ENCOUNTER — Other Ambulatory Visit: Payer: Self-pay | Admitting: Hematology and Oncology

## 2022-06-17 ENCOUNTER — Other Ambulatory Visit (HOSPITAL_COMMUNITY): Payer: Self-pay

## 2022-06-17 DIAGNOSIS — C9110 Chronic lymphocytic leukemia of B-cell type not having achieved remission: Secondary | ICD-10-CM

## 2022-06-17 MED ORDER — CALQUENCE 100 MG PO TABS
100.0000 mg | ORAL_TABLET | Freq: Two times a day (BID) | ORAL | 1 refills | Status: DC
  Filled 2022-06-17: qty 60, 30d supply, fill #0
  Filled 2022-07-23: qty 60, 30d supply, fill #1

## 2022-06-18 ENCOUNTER — Other Ambulatory Visit (HOSPITAL_COMMUNITY): Payer: Self-pay

## 2022-06-25 ENCOUNTER — Other Ambulatory Visit (HOSPITAL_COMMUNITY): Payer: Self-pay

## 2022-06-28 ENCOUNTER — Encounter (HOSPITAL_COMMUNITY): Payer: Self-pay | Admitting: *Deleted

## 2022-06-28 ENCOUNTER — Emergency Department (HOSPITAL_COMMUNITY): Payer: Medicare Other

## 2022-06-28 ENCOUNTER — Other Ambulatory Visit: Payer: Self-pay

## 2022-06-28 ENCOUNTER — Emergency Department (HOSPITAL_COMMUNITY)
Admission: EM | Admit: 2022-06-28 | Discharge: 2022-06-28 | Disposition: A | Discharge status: Home / Self Care | Payer: Medicare Other | Attending: Emergency Medicine | Admitting: Emergency Medicine

## 2022-06-28 DIAGNOSIS — Z7982 Long term (current) use of aspirin: Secondary | ICD-10-CM | POA: Insufficient documentation

## 2022-06-28 DIAGNOSIS — E039 Hypothyroidism, unspecified: Secondary | ICD-10-CM | POA: Diagnosis not present

## 2022-06-28 DIAGNOSIS — S92515A Nondisplaced fracture of proximal phalanx of left lesser toe(s), initial encounter for closed fracture: Secondary | ICD-10-CM | POA: Diagnosis not present

## 2022-06-28 DIAGNOSIS — X58XXXA Exposure to other specified factors, initial encounter: Secondary | ICD-10-CM | POA: Diagnosis not present

## 2022-06-28 DIAGNOSIS — M79672 Pain in left foot: Secondary | ICD-10-CM | POA: Insufficient documentation

## 2022-06-28 DIAGNOSIS — Z85818 Personal history of malignant neoplasm of other sites of lip, oral cavity, and pharynx: Secondary | ICD-10-CM | POA: Diagnosis not present

## 2022-06-28 DIAGNOSIS — S92502A Displaced unspecified fracture of left lesser toe(s), initial encounter for closed fracture: Secondary | ICD-10-CM

## 2022-06-28 DIAGNOSIS — E1140 Type 2 diabetes mellitus with diabetic neuropathy, unspecified: Secondary | ICD-10-CM | POA: Diagnosis not present

## 2022-06-28 DIAGNOSIS — Z79899 Other long term (current) drug therapy: Secondary | ICD-10-CM | POA: Diagnosis not present

## 2022-06-28 DIAGNOSIS — S99922A Unspecified injury of left foot, initial encounter: Secondary | ICD-10-CM | POA: Diagnosis present

## 2022-06-28 MED ORDER — IOHEXOL 350 MG/ML SOLN: 125.0000 mL | Freq: Once | INTRAVENOUS | Status: DC | PRN

## 2022-06-28 NOTE — Progress Notes (Signed)
Orthopedic Tech Progress Note Patient Details:  DILLON MCREYNOLDS November 27, 1945 [id number]  Ortho Devices Type of Ortho Device: CAM walker Ortho Device/Splint Location: LLE Ortho Device/Splint Interventions: Ordered, Application, Adjustment   Post Interventions Patient Tolerated: Well Instructions Provided: Adjustment of device, Care of device, Poper ambulation with device  Jaishaun Mcnab 06/28/2022, 3:49 PM

## 2022-06-28 NOTE — Consult Note (Signed)
Hospital Consult    Reason for Consult: Concern for arterial insufficiency Requesting Physician: ED MRN #:  [id number]  History of Present Illness: This is a 76 y.o. female with prior history of breast cancer, current CLL who presents with a 1 day history of discoloration of the left foot.  Per Marcie Bal, she does not remember any traumatic event, but woke up yesterday morning complaining of numbness in the forefoot.  This is accompanied by waxing waning pain.  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  She is a non-smoker, on a daily statin.  Denies symptoms of claudication, tissue loss.  She is currently being treated for CLL and taking the medication Calquence.  No history of stroke.  Past Medical History:  Diagnosis Date   Alopecia    Anxiety    Arthritis    "spine" (07/28/2013)   Breast cancer ([place]) 1997; 2014   right   CAP (community acquired pneumonia)    admission 06-04-2013, failed outpatient   Chronic back pain    "lower back and upper neck" (07/28/2013)   CLL (chronic lymphocytic leukemia) ([place]) 05/2013   DDD (degenerative disc disease)    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 ear    Depression    Diverticulosis    Elevated LFTs    Fibromyalgia 1978   GERD (gastroesophageal reflux disease)    Hematuria    History of syncope    episode 2008--  no recurrence since   HLD (hyperlipidemia)    Hypothyroidism    Kidney stones 2014   Plantar fasciitis    Ruptured disk    one in neck and two in back   S/P chemotherapy, time since greater than 12 weeks    Sinus tachycardia    mild resting   Skin cancer    nose   Stool incontinence    "at times recently"     Past Surgical History:  Procedure Laterality Date   ANTERIOR LATERAL LUMBAR FUSION 4 LEVELS Left 04/25/2016   Procedure: LEFT LUMBAR ONE-TWO, LUMBAR TWO-THREE, LUMBAR THREE-FOUR, LUMBAR FOUR-FIVE ANTERIOR LATERAL LUMBAR FUSION;  Surgeon: Earnie Larsson, MD;  Location: [facility];  Service: Neurosurgery;  Laterality: Left;   APPENDECTOMY  2878   APPLICATION OF ROBOTIC ASSISTANCE FOR SPINAL PROCEDURE N/A 04/06/2017   Procedure: APPLICATION OF ROBOTIC ASSISTANCE FOR SPINAL PROCEDURE;  Surgeon: Earnie Larsson, MD;  Location: [place];  Service: Neurosurgery;  Laterality: N/A;   BREAST BIOPSY Right 2014   BREAST LUMPECTOMY Right 1997   BREAST LUMPECTOMY WITH AXILLARY LYMPH NODE DISSECTION Right 1997   CATARACT EXTRACTION, BILATERAL  2019   Dr. Kathlen Mody   CYSTOSCOPY WITH RETROGRADE PYELOGRAM, URETEROSCOPY AND STENT PLACEMENT Bilateral 06/17/2013   Procedure: CYSTOSCOPY WITH RETROGRADE PYELOGRAM, URETEROSCOPY AND LEFT DOUBLE  J STENT PLACEMENT RIGHT URETERAL HOLMIIUM LASER AND DOUBLE J STENT ;  Surgeon: Hanley Ben, MD;  Location: [place];  Service: Urology;  Laterality: Bilateral;   HOLMIUM LASER APPLICATION Bilateral 67/67/2094   Procedure: HOLMIUM LASER APPLICATION;  Surgeon: Hanley Ben, MD;  Location: [place];  Service: Urology;  Laterality: Bilateral;   LITHOTRIPSY Left 06/2013   LUMBAR EPIDURAL INJECTION     has had 7 injections   PORT-A-CATH REMOVAL Left 10/03/2014   Procedure: REMOVAL PORT-A-CATH;  Surgeon: Donnie Mesa, MD;  Location: [facility];  Service: General;  Laterality: Left;   PORTACATH PLACEMENT Left 07/28/2013   Procedure: ATTEMPTED INSERTION PORT-A-CATH;  Surgeon: Imogene Burn.  Georgette Dover, MD;  Location: [place];  Service: General;  Laterality: Left;   POSTERIOR LUMBAR FUSION 4 LEVEL N/A 04/25/2016   Procedure: LUMBAR FIVE-SACRAL ONE POSTERIOR LUMBAR INTERBODY FUSION, THORACIC NINE-SACRAL ONE POSTERIOR LATERAL ARTHRODESIS WITH PEDICLE SCREWS;  Surgeon: Earnie Larsson, MD;  Location: [facility];  Service: Neurosurgery;  Laterality: N/A;   RETINAL DETACHMENT SURGERY Right 2013   ROBOTIC ASSITED PARTIAL NEPHRECTOMY Right 02/05/2015   Procedure: ROBOTIC ASSITED PARTIAL NEPHRECTOMY;  Surgeon: Raynelle Bring, MD;   Location: [facility];  Service: Urology;  Laterality: Right;   SIMPLE MASTECTOMY WITH AXILLARY SENTINEL NODE BIOPSY Right 07/28/2013   Procedure: RIGHT TOTAL  MASTECTOMY;  Surgeon: Imogene Burn. Tsuei, MD;  Location: [facility];  Service: General;  Laterality: Right;   TONSILLECTOMY  AGE 55   TOTAL ABDOMINAL HYSTERECTOMY W/ BILATERAL SALPINGOOPHORECTOMY  1997    Allergies  Allergen Reactions   Lasix [Furosemide] Nausea And Vomiting and Other (See Comments)    HEADACHE   Lithium Other (See Comments)    Dizziness, "cause me to fall"   Sulfa Antibiotics Hives   Trazodone And Nefazodone Other (See Comments)    insomnia   Lyrica [Pregabalin] Diarrhea, Nausea Only and Rash    Prior to Admission medications   Medication Sig Start Date End Date Taking? Authorizing Provider  acalabrutinib (CALQUENCE) 100 MG capsule Take 100 mg by mouth 2 (two) times daily.    [provider]  acalabrutinib maleate (CALQUENCE) 100 MG tablet Take 1 tablet by mouth 2 (two) times daily. 06/17/22   Orson Slick, MD  allopurinol (ZYLOPRIM) 300 MG tablet TAKE 1 TABLET(300 MG) BY MOUTH DAILY Patient taking differently: Take 300 mg by mouth daily. 09/03/21   Orson Slick, MD  amitriptyline (ELAVIL) 100 MG tablet Take 100 mg by mouth at bedtime. 09/25/21   [provider]  Apoaequorin (PREVAGEN PO) Take by mouth daily.    [provider]  aspirin 81 MG EC tablet Take 1 tablet by mouth daily.    [provider]  brexpiprazole (REXULTI) 1 MG TABS tablet Take 1 mg by mouth daily.    [provider]  buPROPion (WELLBUTRIN XL) 300 MG 24 hr tablet Take 300 mg by mouth daily. 08/30/19   [provider]  CALCIUM CITRATE PO Take 1 tablet by mouth daily.    [provider]  Cholecalciferol (VITAMIN D3) 50 MCG (2000 UT) TABS Take 1 tablet by mouth daily.    [provider]  Cyanocobalamin (VITAMIN B12) 1000 MCG TBCR Take 1 tablet by mouth daily.    [provider]  diclofenac Sodium (VOLTAREN) 1 % GEL Apply 2 g topically 4 (four) times daily. 05/27/22   Raulkar, Clide Deutscher, MD  DULoxetine (CYMBALTA) 60 MG capsule Take 60 mg by mouth daily. 08/30/19   [provider]  gabapentin (NEURONTIN) 100 MG capsule Take 100 mg by mouth at bedtime. 08/20/21   [provider]  GARLIC PO Take 1 tablet by mouth daily.    [provider]  levothyroxine (SYNTHROID, LEVOTHROID) 88 MCG tablet Take 88 mcg by mouth daily before breakfast. 06/30/17   [provider]  lidocaine (XYLOCAINE) 2 % solution Use as directed 15 mLs in the mouth or throat every 4 (four) hours as needed for mouth pain. 05/09/22   Orson Slick, MD  Multiple Vitamins-Minerals (WOMENS 50+ MULTI VITAMIN/MIN) TABS Take 1 tablet by mouth daily.    [provider]  omeprazole (PRILOSEC) 40 MG  capsule TAKE 1 CAPSULE(40 MG) BY MOUTH DAILY AFTER BREAKFAST Patient taking differently: Take 40 mg by mouth daily. 12/24/21   Orson Slick, MD  rosuvastatin (CRESTOR) 5 MG tablet TAKE 1 TABLET(5 MG) BY MOUTH DAILY Patient taking differently: Take 5 mg by mouth daily. 02/20/22   Jerline Pain, MD  topiramate (TOPAMAX) 25 MG tablet Take 1 tablet (25 mg total) by mouth at bedtime. Patient taking differently: Take 25 mg by mouth every evening. 05/27/22   Raulkar, Clide Deutscher, MD  UNABLE TO FIND Med Name: metatrim    [provider]  zinc gluconate 50 MG tablet Take 50 mg by mouth daily.    [provider]    Social History   Socioeconomic History   Marital status: Married    Spouse name: Arnette Norris   Number of children: 1   Years of education: Not on file   Highest education level: Not on file  Occupational History   Occupation: homemaker  Tobacco Use   Smoking status: Never   Smokeless tobacco: Never  Vaping Use   Vaping Use: Never used  Substance and Sexual Activity   Alcohol use: No   Drug use: No   Sexual activity: Not Currently     Partners: Male    Birth control/protection: Surgical    Comment: TAH/BSO  Other Topics Concern   Not on file  Social History Narrative   Not on file   Social Determinants of Health   Financial Resource Strain: Not on file  Food Insecurity: Not on file  Transportation Needs: Not on file  Physical Activity: Not on file  Stress: Not on file  Social Connections: Not on file  Intimate Partner Violence: Not on file   Family History  Problem Relation Age of Onset   Heart disease Maternal Grandfather    Diabetes Maternal Grandfather    Colon cancer Maternal Aunt 20   Brain cancer Paternal Grandmother        dx in 5s   Dementia Mother    Diabetes Mother    Osteoporosis Mother    Diabetes Maternal Aunt    Prostate cancer Father 60   Bipolar disorder Maternal Aunt    Stroke Maternal Aunt    Stomach cancer Paternal Uncle        dx in late 32s    ROS: Otherwise negative unless mentioned in HPI  Physical Examination  Vitals:   06/28/22 1343  BP: (!) 110/57  Pulse: 84  Resp: 16  Temp: 98.5 F (36.9 C)  SpO2: 99%   Body mass index is 27.1 kg/m.  General:  WDWN in NAD Gait: Not observed HENT: WNL, normocephalic Pulmonary: normal non-labored breathing, without Rales, rhonchi,  wheezing Cardiac: regular, Abdomen:  soft, NT/ND, no masses Skin: without rashes Vascular Exam/Pulses: 2+ dorsalis pedis and posterior tibial arteries bilaterally, 2+ femoral pulses 2+ radial pulses. Significant bruising of the left forefoot, specifically appreciated in the lateral aspect.  Bruising into toes, not at the distal portions. Extremities: without ischemic changes, without Gangrene , without cellulitis; without open wounds;  Musculoskeletal: no muscle wasting or atrophy  Neurologic: A&O X 3;  No focal weakness or paresthesias are detected; speech is fluent/normal Psychiatric:  The pt has Normal affect. Lymph:  Unremarkable  CBC    Component Value Date/Time   WBC 70.8 (HH)  06/06/2022 1346   WBC 43.3 Repeated and verified X2. (HH) 09/18/2021 1506   RBC 3.20 (L) 06/06/2022 1346   HGB 9.5 (L) 06/06/2022  1346   HGB 10.2 (L) 10/10/2020 1559   HGB 11.1 (L) 03/25/2017 1300   HCT 29.3 (L) 06/06/2022 1346   HCT 30.0 (L) 10/10/2020 1559   HCT 35.8 03/25/2017 1300   PLT 218 06/06/2022 1346   PLT 206 10/10/2020 1559   MCV 91.6 06/06/2022 1346   MCV 84 10/10/2020 1559   MCV 85.6 03/25/2017 1300   MCH 29.7 06/06/2022 1346   MCHC 32.4 06/06/2022 1346   RDW 13.9 06/06/2022 1346   RDW 13.9 10/10/2020 1559   RDW 15.7 (H) 03/25/2017 1300   LYMPHSABS 65.0 (H) 06/06/2022 1346   LYMPHSABS 24.9 (H) 03/25/2017 1300   MONOABS 0.7 06/06/2022 1346   MONOABS 1.3 (H) 03/25/2017 1300   EOSABS 0.2 06/06/2022 1346   EOSABS 0.5 03/25/2017 1300   BASOSABS 0.1 06/06/2022 1346   BASOSABS 0.1 03/25/2017 1300    BMET    Component Value Date/Time   NA 122 (L) 06/06/2022 1346   NA 136 10/10/2020 1559   NA 140 03/25/2017 1300   K 4.6 06/06/2022 1346   K 4.2 03/25/2017 1300   CL 92 (L) 06/06/2022 1346   CO2 25 06/06/2022 1346   CO2 24 03/25/2017 1300   GLUCOSE 116 (H) 06/06/2022 1346   GLUCOSE 114 03/25/2017 1300   BUN 11 06/06/2022 1346   BUN 14 10/10/2020 1559   BUN 17.9 03/25/2017 1300   CREATININE 1.04 (H) 06/06/2022 1346   CREATININE 1.1 03/25/2017 1300   CALCIUM 8.5 (L) 06/06/2022 1346   CALCIUM 9.4 03/25/2017 1300   GFRNONAA 56 (L) 06/06/2022 1346   GFRNONAA 49 (L) 12/07/2014 1538   GFRAA 39 (L) 10/10/2020 1559   GFRAA >60 05/11/2020 1034   GFRAA 56 (L) 12/07/2014 1538    COAGS: Lab Results  Component Value Date   INR 1.0 07/13/2020   INR 0.94 07/29/2013    IMPRESSION: Acute or subacute fracture through the base of the fifth toe proximal phalanx.   ASSESSMENT/PLAN: This is a 76 y.o. female who presents with history of atraumatic bruising and discoloration of the left forefoot extending into the toes.  This is accompanied by paresthesias and mild pain.   On physical exam, she had normal, palpable pedal pulses.  Distribution of coloration is atypical of blue toe syndrome, however with the patient's CLL diagnosis, she is hypercoagulable.  Interestingly, her medication Calquence has a side effect profile including bruising.  She had her CT angio chest abdomen pelvis with runoff be negative, question if this is a simple medication side effect.  Will follow-up CT angio chest abdomen pelvis with runoff in an effort to assess for possible nidus.  No limb threatening ischemia at this time.  No indication for urgent vascular invention.  ________________________________  [place] findings just resulted demonstrating acute versus subacute transverse oriented fracture through the base of the fifth toe proximal phalanx.  This would explain the bruising appreciated that extends into the digits.  CTA canceled as bruising and discoloration is consistent with trauma. Recommend orthopedic follow up.  Cassandria Santee MD [facility] [phone] 06/28/2022  2:56 PM

## 2022-06-28 NOTE — Discharge Instructions (Addendum)
You have a fracture at the proximal phalanx fifth toe.  Take Tylenol for pain.  Follow-up with orthopedics, call Monday morning to schedule follow-up.  Wear the cam walking boot when you are moving around during the day.  He can take it off in the shower at night when you are sleeping.

## 2022-06-28 NOTE — ED Triage Notes (Signed)
Patient presents to the ED with left foot pain that began the night before.  Patient reports hx of neuropathy due to chemo treatment.  Pt's foot look darker in color compared to the other foot.  Pain is worse when ambulating.  +2 DP pulses palpated and marked.  Patient a/o x 4.

## 2022-06-28 NOTE — ED Provider Notes (Signed)
[facility] DEPT Provider Note   CSN: [id number] Arrival date & time: 06/28/22  1328     History  No chief complaint on file.   [name] [name] is a 76 y.o. female.  HPI   Patient with medical history of CLL on oral chemotherapy, hypothyroid, chronic pain syndrome, DM, neuropathy, superficial thrombophlebitis presents today due to left lower extremity pain x 1.5 days.  Denies any trauma, states it feels like a burning pain to the sole of her foot and her ankle.  She is decree sensation at baseline secondary to neuropathy.  States this morning she noticed her toes are turning purple prompting 2 days ED visit.  Also felt cold.  No history of previous blood clots, not anticoagulated.  Home Medications Prior to Admission medications   Medication Sig Start Date End Date Taking? Authorizing Provider  acalabrutinib maleate (CALQUENCE) 100 MG tablet Take 1 tablet by mouth 2 (two) times daily. 06/17/22  Yes Orson Slick, MD  allopurinol (ZYLOPRIM) 300 MG tablet TAKE 1 TABLET(300 MG) BY MOUTH DAILY Patient taking differently: Take 300 mg by mouth daily. 09/03/21  Yes Orson Slick, MD  amitriptyline (ELAVIL) 100 MG tablet Take 100 mg by mouth at bedtime. 09/25/21  Yes [provider]  Apoaequorin (PREVAGEN PO) Take by mouth daily.   Yes [provider]  aspirin 81 MG EC tablet Take 1 tablet by mouth daily.   Yes [provider]  brexpiprazole (REXULTI) 1 MG TABS tablet Take 1 mg by mouth daily.   Yes [provider]  buPROPion (WELLBUTRIN XL) 300 MG 24 hr tablet Take 300 mg by mouth daily. 08/30/19  Yes [provider]  CALCIUM CITRATE PO Take 1 tablet by mouth daily.   Yes [provider]  Cholecalciferol (VITAMIN D3) 50 MCG (2000 UT) TABS Take 1 tablet by mouth daily.   Yes [provider]  Cyanocobalamin (VITAMIN B12) 1000 MCG TBCR Take 1 tablet by mouth daily.   Yes [provider]   diclofenac Sodium (VOLTAREN) 1 % GEL Apply 2 g topically 4 (four) times daily. Patient taking differently: Apply 2 g topically daily as needed (for pain). 05/27/22  Yes Raulkar, Clide Deutscher, MD  DULoxetine (CYMBALTA) 60 MG capsule Take 60 mg by mouth 2 (two) times daily. 08/30/19  Yes [provider]  ferrous sulfate 325 (65 FE) MG tablet Take 325 mg by mouth daily with breakfast.   Yes [provider]  gabapentin (NEURONTIN) 100 MG capsule Take 100 mg by mouth at bedtime. 08/20/21  Yes [provider]  GARLIC PO Take 1 tablet by mouth daily.   Yes [provider]  levothyroxine (SYNTHROID, LEVOTHROID) 88 MCG tablet Take 88 mcg by mouth daily before breakfast. 06/30/17  Yes [provider]  Multiple Vitamins-Minerals (WOMENS 50+ MULTI VITAMIN/MIN) TABS Take 1 tablet by mouth daily.   Yes [provider]  omega-3 acid ethyl esters (LOVAZA) 1 g capsule Take 1 g by mouth 2 (two) times daily.   Yes [provider]  omeprazole (PRILOSEC) 40 MG capsule TAKE 1 CAPSULE(40 MG) BY MOUTH DAILY AFTER BREAKFAST Patient taking differently: Take 40 mg by mouth daily. 12/24/21  Yes Orson Slick, MD  rosuvastatin (CRESTOR) 5 MG tablet TAKE 1 TABLET(5 MG) BY MOUTH DAILY Patient taking differently: Take 5 mg by mouth daily. 02/20/22  Yes Jerline Pain, MD  topiramate (TOPAMAX) 25 MG tablet Take 1 tablet (25 mg total) by  mouth at bedtime. Patient taking differently: Take 25 mg by mouth every evening. 05/27/22  Yes Raulkar, Clide Deutscher, MD  zinc gluconate 50 MG tablet Take 50 mg by mouth daily.   Yes [provider]  lidocaine (XYLOCAINE) 2 % solution Use as directed 15 mLs in the mouth or throat every 4 (four) hours as needed for mouth pain. Patient not taking: Reported on 06/28/2022 05/09/22   Orson Slick, MD  UNABLE TO FIND Take 1 tablet by mouth 3 (three) times daily. Med Name: Metatrim    [provider]      Allergies    Lasix  [furosemide], Lithium, Sulfa antibiotics, Trazodone and nefazodone, and Lyrica [pregabalin]    Review of Systems   Review of Systems  Physical Exam Updated Vital Signs BP 105/60   Pulse 80   Temp 98.5 F (36.9 C) (Oral)   Resp 18   Ht '5\' 3"'$  (1.6 m)   Wt 69.4 kg   LMP 08/26/1995   SpO2 100%   BMI 27.10 kg/m  Physical Exam Vitals and nursing note reviewed. Exam conducted with a chaperone present.  Constitutional:      General: She is not in acute distress.    Appearance: Normal appearance.  HENT:     Head: Normocephalic and atraumatic.  Eyes:     General: No scleral icterus.       Right eye: No discharge.        Left eye: No discharge.     Extraocular Movements: Extraocular movements intact.     Pupils: Pupils are equal, round, and reactive to light.  Cardiovascular:     Rate and Rhythm: Normal rate and regular rhythm.     Pulses: Normal pulses.     Heart sounds: Normal heart sounds.  Pulmonary:     Effort: Pulmonary effort is normal. No respiratory distress.     Breath sounds: Normal breath sounds.  Abdominal:     General: Abdomen is flat. Bowel sounds are normal. There is no distension.     Palpations: Abdomen is soft.     Tenderness: There is no abdominal tenderness.  Musculoskeletal:        General: No tenderness.     Comments: Patient can tolerate range of ankle, digits.  No crepitus on exam.  Skin:    General: Skin is warm and dry.     Capillary Refill: Capillary refill takes 2 to 3 seconds.     Coloration: Skin is not jaundiced.     Findings: Bruising present.  Neurological:     Mental Status: She is alert. Mental status is at baseline.     Coordination: Coordination normal.        ED Results / Procedures / Treatments   Labs (all labs ordered are listed, but only abnormal results are displayed) Labs Reviewed - No data to display  EKG None  Radiology DG Foot Complete Left  Result Date: 06/28/2022 CLINICAL DATA:  Left foot pain EXAM: LEFT FOOT  - COMPLETE 3+ VIEW COMPARISON:  None Available. FINDINGS: Acute or subacute transversely oriented fracture through the base of the fifth toe proximal phalanx. No significant displacement or angulation. No additional fractures are seen. No dislocation. Joint spaces are relatively well preserved. Soft tissues within normal limits. IMPRESSION: Acute or subacute fracture through the base of the fifth toe proximal phalanx. Electronically Signed   By: Davina Poke D.O.   On: 06/28/2022 14:48    Procedures Procedures    Medications Ordered  in ED Medications - No data to display  ED Course/ Medical Decision Making/ A&P Clinical Course as of 06/28/22 1744  Sat Jun 28, 2022  1417 I consulted with Dr. Virl Cagey with vascular surgery.  Purple toe versus ischemic limb.  He is recommending CTA chest abdomen pelvis with runoff.  He will evaluate the patient at [facility].  Appreciate his consult. [HS]  2426 Vascular surgery evaluated the patient.  Not finding fracture on the x-ray does not think we need to proceed with CTA or laboratory work-up.  Recommends treating the fracture and following up in the office with orthopedics.   [HS]  1526 I reevaluated the patient.  She states she actually did have a fall on Monday where she twisted her ankle.  She did not notice pain since then until the last few days.  When she fell she did not hit her head or lose consciousness, no pain to the upper extremities. [HS]    Clinical Course User Index [HS] Sherrill Raring, PA-C                           Medical Decision Making Amount and/or Complexity of Data Reviewed Labs: ordered. Radiology: ordered.   Patient presents due to left foot pain.  Differential includes ischemic foot, fractures, dislocations, compartment syndrome, septic limb, purple toe syndrome.  On exam patient is palpable poor pulses but distal discoloration overlying her toes.  She is very mild tenderness but tolerates range.  Cap refill is may be slightly  delayed at 2 to 3 seconds.  I consulted vascular surgery as documented in ED course.   Plain film of the foot is notable for acute versus subacute fracture through the base of the fifth toe proximal phalanx.  Agree with radiologist interpretation.  Initially the plan was to proceed with CTA to evaluate for ischemic limb.  Given fracture and reproducible tenderness vascular surgery thinks most likely related to the fracture rather than ischemic limb or coagulopathy.  Especially in the context of pulses and perfusion.  Discussed HPI, physical exam and plan of care for this patient with attending Dene Gentry. The attending physician evaluated this patient as part of a shared visit and agrees with plan of care.         Final Clinical Impression(s) / ED Diagnoses Final diagnoses:  Closed nondisplaced fracture of proximal phalanx of lesser toe of left foot, initial encounter    Rx / DC Orders ED Discharge Orders     None         Sherrill Raring, PA-C 06/28/22 1744    Valarie Merino, MD 06/28/22 2322

## 2022-07-01 ENCOUNTER — Ambulatory Visit: Payer: Medicare Other | Admitting: Pulmonary Disease

## 2022-07-01 DIAGNOSIS — M5414 Radiculopathy, thoracic region: Secondary | ICD-10-CM | POA: Diagnosis not present

## 2022-07-02 ENCOUNTER — Other Ambulatory Visit: Payer: Medicare Other

## 2022-07-02 ENCOUNTER — Ambulatory Visit: Payer: Medicare Other | Admitting: Hematology and Oncology

## 2022-07-02 DIAGNOSIS — S92912A Unspecified fracture of left toe(s), initial encounter for closed fracture: Secondary | ICD-10-CM | POA: Diagnosis not present

## 2022-07-02 DIAGNOSIS — M79672 Pain in left foot: Secondary | ICD-10-CM | POA: Diagnosis not present

## 2022-07-03 ENCOUNTER — Encounter (HOSPITAL_BASED_OUTPATIENT_CLINIC_OR_DEPARTMENT_OTHER): Payer: Self-pay | Admitting: *Deleted

## 2022-07-08 ENCOUNTER — Other Ambulatory Visit: Payer: Self-pay

## 2022-07-08 ENCOUNTER — Telehealth: Payer: Self-pay | Admitting: *Deleted

## 2022-07-08 ENCOUNTER — Inpatient Hospital Stay: Payer: Medicare Other | Admitting: Hematology and Oncology

## 2022-07-08 ENCOUNTER — Other Ambulatory Visit: Payer: Self-pay | Admitting: Hematology and Oncology

## 2022-07-08 ENCOUNTER — Inpatient Hospital Stay: Payer: Medicare Other | Attending: Hematology and Oncology

## 2022-07-08 VITALS — BP 127/60 | HR 83 | Temp 98.0°F | Resp 15 | Wt 151.2 lb

## 2022-07-08 DIAGNOSIS — E039 Hypothyroidism, unspecified: Secondary | ICD-10-CM | POA: Diagnosis not present

## 2022-07-08 DIAGNOSIS — R053 Chronic cough: Secondary | ICD-10-CM | POA: Insufficient documentation

## 2022-07-08 DIAGNOSIS — Z9011 Acquired absence of right breast and nipple: Secondary | ICD-10-CM | POA: Insufficient documentation

## 2022-07-08 DIAGNOSIS — Z853 Personal history of malignant neoplasm of breast: Secondary | ICD-10-CM | POA: Insufficient documentation

## 2022-07-08 DIAGNOSIS — R2681 Unsteadiness on feet: Secondary | ICD-10-CM | POA: Insufficient documentation

## 2022-07-08 DIAGNOSIS — Z79899 Other long term (current) drug therapy: Secondary | ICD-10-CM | POA: Diagnosis not present

## 2022-07-08 DIAGNOSIS — C9110 Chronic lymphocytic leukemia of B-cell type not having achieved remission: Secondary | ICD-10-CM | POA: Diagnosis not present

## 2022-07-08 LAB — CMP (CANCER CENTER ONLY)
ALT: 16 U/L (ref 0–44)
AST: 18 U/L (ref 15–41)
Albumin: 4.7 g/dL (ref 3.5–5.0)
Alkaline Phosphatase: 80 U/L (ref 38–126)
Anion gap: 8 (ref 5–15)
BUN: 19 mg/dL (ref 8–23)
CO2: 22 mmol/L (ref 22–32)
Calcium: 9.5 mg/dL (ref 8.9–10.3)
Chloride: 104 mmol/L (ref 98–111)
Creatinine: 0.98 mg/dL (ref 0.44–1.00)
GFR, Estimated: 60 mL/min — ABNORMAL LOW (ref >60)
Glucose, Bld: 98 mg/dL (ref 70–99)
Potassium: 4.7 mmol/L (ref 3.5–5.1)
Sodium: 134 mmol/L — ABNORMAL LOW (ref 135–145)
Total Bilirubin: 0.4 mg/dL (ref 0.3–1.2)
Total Protein: 6.9 g/dL (ref 6.5–8.1)

## 2022-07-08 LAB — CBC WITH DIFFERENTIAL (CANCER CENTER ONLY)
Abs Immature Granulocytes: 0.2 10*3/uL — ABNORMAL HIGH (ref 0.00–0.07)
Basophils Absolute: 0.1 10*3/uL (ref 0.0–0.1)
Basophils Relative: 0 %
Eosinophils Absolute: 0.4 10*3/uL (ref 0.0–0.5)
Eosinophils Relative: 1 %
HCT: 32.4 % — ABNORMAL LOW (ref 36.0–46.0)
Hemoglobin: 10.4 g/dL — ABNORMAL LOW (ref 12.0–15.0)
Immature Granulocytes: 0 %
Lymphocytes Relative: 88 %
Lymphs Abs: 61.7 10*3/uL — ABNORMAL HIGH (ref 0.7–4.0)
MCH: 30.7 pg (ref 26.0–34.0)
MCHC: 32.1 g/dL (ref 30.0–36.0)
MCV: 95.6 fL (ref 80.0–100.0)
Monocytes Absolute: 0.8 10*3/uL (ref 0.1–1.0)
Monocytes Relative: 1 %
Neutro Abs: 6.8 10*3/uL (ref 1.7–7.7)
Neutrophils Relative %: 10 %
Platelet Count: 322 10*3/uL (ref 150–400)
RBC: 3.39 MIL/uL — ABNORMAL LOW (ref 3.87–5.11)
RDW: 14.9 % (ref 11.5–15.5)
Smear Review: NORMAL
WBC Count: 70 10*3/uL (ref 4.0–10.5)
nRBC: 0 % (ref 0.0–0.2)

## 2022-07-08 LAB — LACTATE DEHYDROGENASE: LDH: 137 U/L (ref 98–192)

## 2022-07-08 NOTE — Progress Notes (Signed)
[place] [phone]   [phone]  PROGRESS NOTE  Patient Care Team: Deland Pretty, MD as PCP - General (Internal Medicine) Jerline Pain, MD as PCP - Cardiology (Cardiology) Milus Banister, MD as Attending Physician (Gastroenterology) Megan Salon, MD as Consulting Physician (Gynecology) Donnie Mesa, MD as Consulting Physician (General Surgery) Noemi Chapel, NP as Nurse Practitioner Rod Can, MD as Consulting Physician (Orthopedic Surgery) Latanya Maudlin, MD as Consulting Physician (Orthopedic Surgery) Ria Clock, MD as Attending Physician (Radiology) Adele Dan, MD as Referring Physician (Orthopedic Surgery) Orson Slick, MD as Consulting Physician (Hematology and Oncology)  Hematological/Oncological History # CLL Rai Stage 1  06/04/2021: last visit with Dr. Jana Hakim. Detailed history of his care history noted below.  09/18/2021: WBC 43.3, Hgb 13.0, MCV 87.1, Plt 275 09/23/2021: establish care with Dr. Lorenso Courier. WBC 30.1, Hgb 12.3, MCV 87.8, Plt 241 03/11/2022: WBC 57.4, Hgb 11.2, MCV 88.3, Plt 181  04/29/2022: Started acalabrutinib 100 mg PO twice daily  Interval History:  [name] [name] 76 y.o. 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 The patient's last visit was on 06/06/2022. In the interim since the last visit she has continued acalbrutinib therapy.   On exam today [name] [name] is accompanied by her daughter. She reports "her back is killing her all the time".  She notes that she went underwent a steroid injection last Tuesday but it did not help to relieve her symptoms.  Unfortunately she was also waxing the floor this week.  She reports that she is taking her acalabrutinib medication faithfully twice daily.  She reports that she has pounding in her head on a daily basis.  It can occur for a few hours and is on and off.  It is not  painful and is more of a nuisance.  She also notes that she continues to have a chronic cough.  She has been a little unsteady on her feet.  She is also been trying to eat more salt in order to boost her sodium levels and her last sodium was 134.  Unfortunately she also underwent her COVID, flu and RSV vaccines all at the same time.  She had a fall shortly thereafter and broke her toe.  She was evaluated in the emergency department and the toe fracture was confirmed with x-ray.  She notes that her energy levels overall right now are quite good and currently ranks them as a 6 out of 10.  Her appetite is strong and she is eating well.  She denies fevers, chills, sweats, shortness of breath, chest pain or cough.She has no other complaints. Rest of 10 point ROS is listed below.  Overall she is willing and able to proceed with acalabrutinib therapy at this time.   MEDICAL HISTORY:  Past Medical History:  Diagnosis Date   Alopecia    Anxiety    Arthritis    "spine" (07/28/2013)   Breast cancer ([name]) 1997; 2014   right   CAP (community acquired pneumonia)    admission 06-04-2013, failed outpatient   Chronic back pain    "lower back and upper neck" (07/28/2013)   CLL (chronic lymphocytic leukemia) ([place]) 05/2013   DDD (degenerative disc disease)    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 ear    Depression    Diverticulosis    Elevated LFTs    Fibromyalgia 1978   GERD (gastroesophageal reflux disease)    Hematuria  History of syncope    episode 2008--  no recurrence since   HLD (hyperlipidemia)    Hypothyroidism    Kidney stones 2014   Plantar fasciitis    Ruptured disk    one in neck and two in back   S/P chemotherapy, time since greater than 12 weeks    Sinus tachycardia    mild resting   Skin cancer    nose   Stool incontinence    "at times recently"     SURGICAL HISTORY: Past Surgical History:  Procedure Laterality Date   ANTERIOR LATERAL LUMBAR FUSION 4 LEVELS Left 04/25/2016   Procedure: LEFT  LUMBAR ONE-TWO, LUMBAR TWO-THREE, LUMBAR THREE-FOUR, LUMBAR FOUR-FIVE ANTERIOR LATERAL LUMBAR FUSION;  Surgeon: Earnie Larsson, MD;  Location: [facility];  Service: Neurosurgery;  Laterality: Left;   APPENDECTOMY  7829   APPLICATION OF ROBOTIC ASSISTANCE FOR SPINAL PROCEDURE N/A 04/06/2017   Procedure: APPLICATION OF ROBOTIC ASSISTANCE FOR SPINAL PROCEDURE;  Surgeon: Earnie Larsson, MD;  Location: [facility];  Service: Neurosurgery;  Laterality: N/A;   BREAST BIOPSY Right 2014   BREAST LUMPECTOMY Right 1997   BREAST LUMPECTOMY WITH AXILLARY LYMPH NODE DISSECTION Right 1997   CATARACT EXTRACTION, BILATERAL  2019   Dr. Kathlen Mody   CYSTOSCOPY WITH RETROGRADE PYELOGRAM, URETEROSCOPY AND STENT PLACEMENT Bilateral 06/17/2013   Procedure: CYSTOSCOPY WITH RETROGRADE PYELOGRAM, URETEROSCOPY AND LEFT DOUBLE  J STENT PLACEMENT RIGHT URETERAL HOLMIIUM LASER AND DOUBLE J STENT ;  Surgeon: Hanley Ben, MD;  Location: [facility];  Service: Urology;  Laterality: Bilateral;   HOLMIUM LASER APPLICATION Bilateral 56/21/3086   Procedure: HOLMIUM LASER APPLICATION;  Surgeon: Hanley Ben, MD;  Location: [facility];  Service: Urology;  Laterality: Bilateral;   LITHOTRIPSY Left 06/2013   LUMBAR EPIDURAL INJECTION     has had 7 injections   PORT-A-CATH REMOVAL Left 10/03/2014   Procedure: REMOVAL PORT-A-CATH;  Surgeon: Donnie Mesa, MD;  Location: [facility];  Service: General;  Laterality: Left;   PORTACATH PLACEMENT Left 07/28/2013   Procedure: ATTEMPTED INSERTION PORT-A-CATH;  Surgeon: Imogene Burn. Georgette Dover, MD;  Location: [facility];  Service: General;  Laterality: Left;   POSTERIOR LUMBAR FUSION 4 LEVEL N/A 04/25/2016   Procedure: LUMBAR FIVE-SACRAL ONE POSTERIOR LUMBAR INTERBODY FUSION, THORACIC NINE-SACRAL ONE POSTERIOR LATERAL ARTHRODESIS WITH PEDICLE SCREWS;  Surgeon: Earnie Larsson, MD;  Location: [facility];  Service: Neurosurgery;  Laterality: N/A;   RETINAL DETACHMENT SURGERY Right  2013   ROBOTIC ASSITED PARTIAL NEPHRECTOMY Right 02/05/2015   Procedure: ROBOTIC ASSITED PARTIAL NEPHRECTOMY;  Surgeon: Raynelle Bring, MD;  Location: [facility];  Service: Urology;  Laterality: Right;   SIMPLE MASTECTOMY WITH AXILLARY SENTINEL NODE BIOPSY Right 07/28/2013   Procedure: RIGHT TOTAL  MASTECTOMY;  Surgeon: Imogene Burn. Georgette Dover, MD;  Location: [facility];  Service: General;  Laterality: Right;   TONSILLECTOMY  AGE 33   TOTAL ABDOMINAL HYSTERECTOMY W/ BILATERAL SALPINGOOPHORECTOMY  1997    SOCIAL HISTORY: Social History   Socioeconomic History   Marital status: Married    Spouse name: Arnette Norris   Number of children: 1   Years of education: Not on file   Highest education level: Not on file  Occupational History   Occupation: homemaker  Tobacco Use   Smoking status: Never   Smokeless tobacco: Never  Vaping Use   Vaping Use: Never used  Substance and Sexual Activity   Alcohol use: No   Drug use: No   Sexual activity: Not Currently  Partners: Male    Birth control/protection: Surgical    Comment: TAH/BSO  Other Topics Concern   Not on file  Social History Narrative   Not on file   Social Determinants of Health   Financial Resource Strain: Not on file  Food Insecurity: Not on file  Transportation Needs: Not on file  Physical Activity: Not on file  Stress: Not on file  Social Connections: Not on file  Intimate Partner Violence: Not on file    FAMILY HISTORY: Family History  Problem Relation Age of Onset   Heart disease Maternal Grandfather    Diabetes Maternal Grandfather    Colon cancer Maternal Aunt 32   Brain cancer Paternal Grandmother        dx in 68s   Dementia Mother    Diabetes Mother    Osteoporosis Mother    Diabetes Maternal Aunt    Prostate cancer Father 3   Bipolar disorder Maternal Aunt    Stroke Maternal Aunt    Stomach cancer Paternal Uncle        dx in late 29s    ALLERGIES:  is allergic to lasix [furosemide], lithium, sulfa antibiotics,  trazodone and nefazodone, and lyrica [pregabalin].  MEDICATIONS:  Current Outpatient Medications  Medication Sig Dispense Refill   acalabrutinib maleate (CALQUENCE) 100 MG tablet Take 1 tablet by mouth 2 (two) times daily. 60 tablet 1   allopurinol (ZYLOPRIM) 300 MG tablet TAKE 1 TABLET(300 MG) BY MOUTH DAILY (Patient taking differently: Take 300 mg by mouth daily.) 90 tablet 4   amitriptyline (ELAVIL) 100 MG tablet Take 100 mg by mouth at bedtime.     Apoaequorin (PREVAGEN PO) Take by mouth daily.     aspirin 81 MG EC tablet Take 1 tablet by mouth daily.     brexpiprazole (REXULTI) 1 MG TABS tablet Take 1 mg by mouth daily.     buPROPion (WELLBUTRIN XL) 300 MG 24 hr tablet Take 300 mg by mouth daily.     CALCIUM CITRATE PO Take 1 tablet by mouth daily.     Cholecalciferol (VITAMIN D3) 50 MCG (2000 UT) TABS Take 1 tablet by mouth daily.     Cyanocobalamin (VITAMIN B12) 1000 MCG TBCR Take 1 tablet by mouth daily.     diclofenac Sodium (VOLTAREN) 1 % GEL Apply 2 g topically 4 (four) times daily. (Patient taking differently: Apply 2 g topically daily as needed (for pain).) 2 g 3   DULoxetine (CYMBALTA) 60 MG capsule Take 60 mg by mouth 2 (two) times daily.     ferrous sulfate 325 (65 FE) MG tablet Take 325 mg by mouth daily with breakfast.     gabapentin (NEURONTIN) 100 MG capsule Take 100 mg by mouth at bedtime.     GARLIC PO Take 1 tablet by mouth daily.     levothyroxine (SYNTHROID, LEVOTHROID) 88 MCG tablet Take 88 mcg by mouth daily before breakfast.  4   lidocaine (XYLOCAINE) 2 % solution Use as directed 15 mLs in the mouth or throat every 4 (four) hours as needed for mouth pain. (Patient not taking: Reported on 06/28/2022) 100 mL 1   Multiple Vitamins-Minerals (WOMENS 50+ MULTI VITAMIN/MIN) TABS Take 1 tablet by mouth daily.     omega-3 acid ethyl esters (LOVAZA) 1 g capsule Take 1 g by mouth 2 (two) times daily.     omeprazole (PRILOSEC) 40 MG capsule TAKE 1 CAPSULE(40 MG) BY MOUTH DAILY  AFTER BREAKFAST (Patient taking differently: Take 40 mg by mouth daily.)  90 capsule 0   rosuvastatin (CRESTOR) 5 MG tablet TAKE 1 TABLET(5 MG) BY MOUTH DAILY (Patient taking differently: Take 5 mg by mouth daily.) 90 tablet 2   topiramate (TOPAMAX) 25 MG tablet Take 1 tablet (25 mg total) by mouth at bedtime. (Patient taking differently: Take 25 mg by mouth every evening.) 90 tablet 3   UNABLE TO FIND Take 1 tablet by mouth 3 (three) times daily. Med Name: Metatrim     zinc gluconate 50 MG tablet Take 50 mg by mouth daily.     No current facility-administered medications for this visit.    REVIEW OF SYSTEMS:   Constitutional: ( - ) fevers, ( - )  chills , ( - ) night sweats Eyes: ( - ) blurriness of vision, ( - ) double vision, ( - ) watery eyes Ears, nose, mouth, throat, and face: ( - ) mucositis, ( - ) sore throat Respiratory: ( - ) cough, ( - ) dyspnea, ( - ) wheezes Cardiovascular: ( - ) palpitation, ( - ) chest discomfort, ( - ) lower extremity swelling Gastrointestinal:  ( - ) nausea, ( - ) heartburn, ( - ) change in bowel habits Skin: ( - ) abnormal skin rashes Lymphatics: ( - ) new lymphadenopathy, ( - ) easy bruising Neurological: ( - ) numbness, ( - ) tingling, ( - ) new weaknesses Behavioral/Psych: ( - ) mood change, ( - ) new changes  All other systems were reviewed with the patient and are negative.  PHYSICAL EXAMINATION:  Vitals:   07/08/22 1111  BP: 127/60  Pulse: 83  Resp: 15  Temp: 98 F (36.7 C)  SpO2: 100%   Filed Weights   07/08/22 1111  Weight: 151 lb 3.2 oz (68.6 kg)    GENERAL: well appearing elderly Caucasian female. alert, no distress and comfortable SKIN:  skin color, texture, turgor are normal, no rashes or significant lesions EYES: conjunctiva are pink and non-injected, sclera clear NECK: supple, non-tender LYMPH:  no palpable lymphadenopathy in the cervical, axillary or inguinal LUNGS: clear to auscultation and percussion with normal breathing  effort HEART: regular rate & rhythm and no murmurs and no lower extremity edema Musculoskeletal: no cyanosis of digits and no clubbing  PSYCH: alert & oriented x 3, fluent speech NEURO: no focal motor/sensory deficits  LABORATORY DATA:  I have reviewed the data as listed    Latest Ref Rng & Units 07/08/2022   10:41 AM 06/06/2022    1:46 PM 05/23/2022   12:56 PM  CBC  WBC 4.0 - 10.5 K/uL 70.0  70.8  99.2   Hemoglobin 12.0 - 15.0 g/dL 10.4  9.5  9.9   Hematocrit 36.0 - 46.0 % 32.4  29.3  31.0   Platelets 150 - 400 K/uL 322  218  257        Latest Ref Rng & Units 07/08/2022   10:41 AM 06/06/2022    1:46 PM 05/23/2022   12:56 PM  CMP  Glucose 70 - 99 mg/dL 98  116  101   BUN 8 - 23 mg/dL _0 Creatinine 0.44 - 1.00 mg/dL 0.98  1.04  0.78   Sodium 135 - 145 mmol/L 134  122  125   Potassium 3.5 - 5.1 mmol/L 4.7  4.6  5.1   Chloride 98 - 111 mmol/L 104  92  93   CO2 22 - 32 mmol/L _1 Calcium 8.9 - 10.3  mg/dL 9.5  8.5  9.0   Total Protein 6.5 - 8.1 g/dL 6.9  6.1  6.5   Total Bilirubin 0.3 - 1.2 mg/dL 0.4  0.4  0.5   Alkaline Phos 38 - 126 U/L 80  64  70   AST 15 - 41 U/L _0 ALT 0 - 44 U/L _1 RADIOGRAPHIC STUDIES: DG Foot Complete Left  Result Date: 06/28/2022 CLINICAL DATA:  Left foot pain EXAM: LEFT FOOT - COMPLETE 3+ VIEW COMPARISON:  None Available. FINDINGS: Acute or subacute transversely oriented fracture through the base of the fifth toe proximal phalanx. No significant displacement or angulation. No additional fractures are seen. No dislocation. Joint spaces are relatively well preserved. Soft tissues within normal limits. IMPRESSION: Acute or subacute fracture through the base of the fifth toe proximal phalanx. Electronically Signed   By: Davina Poke D.O.   On: 06/28/2022 14:48    ASSESSMENT & PLAN [name] [name] 76 y.o. female with medical history significant for CLL and remote ER+ breast cancer who presents for a follow up  visit.  History Adapted from Dr. Virgie Dad last note:   (1) status post right breast lumpectomy and axillary lymph node dissection in 1997 for a stage I breast cancer, treated with adjuvant radiation and tamoxifen for 5 years   (2) status post right breast lower outer quadrant biopsy 07/13/2013 for a clinical T1c N0, stage IA invasive ductal carcinoma, grade 2, estrogen receptor 100% positive, progesterone receptor 13% positive, with an MIB-1 of 33%, and HER-2 amplification by CISH with a HER2/CEP 17 ratio of 3.19, and an average HER-2 copy number per cell of 4.15   (3) status post right mastectomy 07/28/2013 for a pT1c pN0, stage IA invasive ductal carcinoma, grade 3, with close but negative margins. Prognostic panel was not repeated             (a) the patient met with Dr. Harlow Mares and has decided against reconstruction   (4) completed weekly paclitaxel x12 12/06/2913, with trastuzumab/ pertuzumab every 3 weeks; pertuzumab was held with third dose on 11/01/2013 due to diarrhea, tried a half dose on cycle 4 again with diarrhea developing   (5) trastuzumab (started 09/20/2013) continued for 1 year, last dose 09/21/2014;             (a) final echocardiogram 09/07/2014 showed an ejection fraction of 55-60%   (6) anastrozole started May 2015; completed April 2020             (a) bone density 12/15/2013 normal             (b) bone density 02/01/2016 at [facility] was normal with a T score of -1.0     OTHER PROBLEMS: (a) History of chronic lymphoid leukemia diagnosed by flow cytometry 06/29/2013, the cells being CD5, CD20 and CD23 positive, CD10 negative.              (1) right renal mass resected on 02/05/15, consisting of atypical lymphoid proliferation composed of monotonous small lymphocytes              (2) Anemia with a normal MCV and normal ferritin-- B-12 and folate normal; stable             (3)  ibrutinib 280 milligrams daily started 03/26/2019, discontinued 06/03/2019 because of concerns  regarding dosing             (4) rituximab weekly x8  started 06/15/2019 completed December 2020             (5) maintenance rituximab (Q 2 months) started 10/25/2019, continued through 08/03/2020  (6) 04/29/2022: Started acalabrutinib 100 mg PO twice daily   # CLL Rai Stage I --Due to rapid doubling time of white blood cell count from 28.3 on 10/07/2021 to 66.7 on 04/09/2022, recommendation was to initiate therapy.  Discussed options with patient she was agreeable to pursuing acalabrutinib 100 mg twice daily, started on 04/29/2022 --Last imaging of the abdomen in 2022 showed no evidence of splenomegaly Plan:  --Labs today show WBC 70.0. There is mild anemia with Hgb 10.4.  Creatinine and LFTs normal.  --Recommend to continue acalibrutinib 100 mg BID with no dose modifications.  --RTC in 4 weeks with labs.   #Head throbbing: --Patient reports throbbing sensation without headaches. Likely secondary to medication.  --Advised that headaches respond well to caffeine and trying to drink a caffeinated beverage. --Monitor for now and advised to follow up if symptoms worsen.  # ER + Breast Cancer in Survivorship -- Continue yearly mammograms. --last mammogram on 05/30/2021.  --Currently on survivorship.  No orders of the defined types were placed in this encounter.  All questions were answered. The patient knows to call the clinic with any problems, questions or concerns.  I have spent a total of 30 minutes minutes of face-to-face and non-face-to-face time, preparing to see the patient, performing a medically appropriate examination, counseling and educating the patient, ordering tests/procedures, documenting clinical information in the electronic health record, and care coordination.   Ledell Peoples, MD Department of Hematology/Oncology [place] [facility] Phone: [phone] Pager: [phone] Email: [name]  07/08/2022 2:15 PM

## 2022-07-08 NOTE — Telephone Encounter (Signed)
CRITICAL VALUE STICKER  CRITICAL VALUE: WBC 70.0  RECEIVER (on-site recipient of call): Georgina Pillion, RN  DATE & TIME NOTIFIED: 07/08/22; 1100  MESSENGER (representative from lab): Anette Guarneri  MD NOTIFIED: Dr. Lorenso Courier  TIME OF NOTIFICATION: 1105  RESPONSE: Information acknowledged - patient has appt today

## 2022-07-09 DIAGNOSIS — Z6826 Body mass index (BMI) 26.0-26.9, adult: Secondary | ICD-10-CM | POA: Diagnosis not present

## 2022-07-09 DIAGNOSIS — M5414 Radiculopathy, thoracic region: Secondary | ICD-10-CM | POA: Diagnosis not present

## 2022-07-10 ENCOUNTER — Telehealth: Payer: Self-pay | Admitting: *Deleted

## 2022-07-10 NOTE — Telephone Encounter (Signed)
Received vm message from patient. She states she saw her neurologist and will surgery on her back after the 1st of the year. She would like to know how this back surgery will affect her Calquence or the other way around.  Please advise

## 2022-07-11 ENCOUNTER — Other Ambulatory Visit: Payer: Self-pay | Admitting: Neurosurgery

## 2022-07-11 ENCOUNTER — Ambulatory Visit: Payer: Medicare Other | Admitting: Pulmonary Disease

## 2022-07-14 ENCOUNTER — Other Ambulatory Visit: Payer: Self-pay

## 2022-07-21 ENCOUNTER — Ambulatory Visit (INDEPENDENT_AMBULATORY_CARE_PROVIDER_SITE_OTHER): Payer: Medicare Other | Admitting: Obstetrics & Gynecology

## 2022-07-21 ENCOUNTER — Encounter (HOSPITAL_BASED_OUTPATIENT_CLINIC_OR_DEPARTMENT_OTHER): Payer: Self-pay | Admitting: Obstetrics & Gynecology

## 2022-07-21 ENCOUNTER — Other Ambulatory Visit (HOSPITAL_COMMUNITY): Payer: Self-pay

## 2022-07-21 VITALS — BP 115/43 | HR 81 | Ht 63.0 in | Wt 161.6 lb

## 2022-07-21 DIAGNOSIS — C9110 Chronic lymphocytic leukemia of B-cell type not having achieved remission: Secondary | ICD-10-CM

## 2022-07-21 DIAGNOSIS — Z170 Estrogen receptor positive status [ER+]: Secondary | ICD-10-CM

## 2022-07-21 DIAGNOSIS — Z9189 Other specified personal risk factors, not elsewhere classified: Secondary | ICD-10-CM

## 2022-07-21 DIAGNOSIS — C50511 Malignant neoplasm of lower-outer quadrant of right female breast: Secondary | ICD-10-CM | POA: Diagnosis not present

## 2022-07-21 DIAGNOSIS — Z90710 Acquired absence of both cervix and uterus: Secondary | ICD-10-CM

## 2022-07-21 DIAGNOSIS — N952 Postmenopausal atrophic vaginitis: Secondary | ICD-10-CM

## 2022-07-21 NOTE — Progress Notes (Unsigned)
76 y.o. G1P1 Married White or Caucasian female here for breast and pelvic exam. I am following her as well for hx of breast cancer diagnosed 07/2013.  Followed by Dr. Lorenso Courier since Dr. Virgie Dad retirement.  Denies vaginal bleeding.  Has lost about 40 pounds since I saw her last.  She did nutri system for about 3 months and then kept working on it on her own.    Had long Covid last year.    Is having back surgery again in January.  Dr. Annette Stable at [facility] is going to do this.  Patient's last menstrual period was 08/26/1995.          Sexually active: No.  H/O STD:  no  Health Maintenance: PCP:  Dr. Shelia Media.  Last wellness appt was in the spring.  Did blood work at that appt:  yes Vaccines are up to date:  yes Colonoscopy:  11/03/2014 MMG:  06/09/2022 Normal BMD:  05/30/2021 Osteopenia Last pap smear:  10/09/2015 Negative.   H/o abnormal pap smear:  no    reports that she has never smoked. She has never used smokeless tobacco. She reports that she does not drink alcohol and does not use drugs.  Past Medical History:  Diagnosis Date   Alopecia    Anxiety    Arthritis    "spine" (07/28/2013)   Breast cancer ([place]) 1997; 2014   right   CAP (community acquired pneumonia)    admission 06-04-2013, failed outpatient   Chronic back pain    "lower back and upper neck" (07/28/2013)   CLL (chronic lymphocytic leukemia) ([place]) 05/2013   DDD (degenerative disc disease)    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 ear    Depression    Diverticulosis    Elevated LFTs    Fibromyalgia 1978   GERD (gastroesophageal reflux disease)    Hematuria    History of syncope    episode 2008--  no recurrence since   HLD (hyperlipidemia)    Hypothyroidism    Kidney stones 2014   Plantar fasciitis    Ruptured disk    one in neck and two in back   S/P chemotherapy, time since greater than 12 weeks    Sinus tachycardia    mild resting   Skin cancer    nose   Stool incontinence    "at times recently"     Past Surgical  History:  Procedure Laterality Date   ANTERIOR LATERAL LUMBAR FUSION 4 LEVELS Left 04/25/2016   Procedure: LEFT LUMBAR ONE-TWO, LUMBAR TWO-THREE, LUMBAR THREE-FOUR, LUMBAR FOUR-FIVE ANTERIOR LATERAL LUMBAR FUSION;  Surgeon: Earnie Larsson, MD;  Location: [facility];  Service: Neurosurgery;  Laterality: Left;   APPENDECTOMY  1696   APPLICATION OF ROBOTIC ASSISTANCE FOR SPINAL PROCEDURE N/A 04/06/2017   Procedure: APPLICATION OF ROBOTIC ASSISTANCE FOR SPINAL PROCEDURE;  Surgeon: Earnie Larsson, MD;  Location: [facility];  Service: Neurosurgery;  Laterality: N/A;   BREAST BIOPSY Right 2014   BREAST LUMPECTOMY Right 1997   BREAST LUMPECTOMY WITH AXILLARY LYMPH NODE DISSECTION Right 1997   CATARACT EXTRACTION, BILATERAL  2019   Dr. Kathlen Mody   CYSTOSCOPY WITH RETROGRADE PYELOGRAM, URETEROSCOPY AND STENT PLACEMENT Bilateral 06/17/2013   Procedure: CYSTOSCOPY WITH RETROGRADE PYELOGRAM, URETEROSCOPY AND LEFT DOUBLE  J STENT PLACEMENT RIGHT URETERAL HOLMIIUM LASER AND DOUBLE J STENT ;  Surgeon: Hanley Ben, MD;  Location: [facility];  Service: Urology;  Laterality: Bilateral;   HOLMIUM LASER APPLICATION Bilateral 78/93/8101   Procedure: HOLMIUM LASER APPLICATION;  Surgeon: Hanley Ben,  MD;  Location: [facility];  Service: Urology;  Laterality: Bilateral;   LITHOTRIPSY Left 06/2013   LUMBAR EPIDURAL INJECTION     has had 7 injections   PORT-A-CATH REMOVAL Left 10/03/2014   Procedure: REMOVAL PORT-A-CATH;  Surgeon: Donnie Mesa, MD;  Location: [facility];  Service: General;  Laterality: Left;   PORTACATH PLACEMENT Left 07/28/2013   Procedure: ATTEMPTED INSERTION PORT-A-CATH;  Surgeon: Imogene Burn. Georgette Dover, MD;  Location: [facility];  Service: General;  Laterality: Left;   POSTERIOR LUMBAR FUSION 4 LEVEL N/A 04/25/2016   Procedure: LUMBAR FIVE-SACRAL ONE POSTERIOR LUMBAR INTERBODY FUSION, THORACIC NINE-SACRAL ONE POSTERIOR LATERAL ARTHRODESIS WITH PEDICLE SCREWS;  Surgeon: Earnie Larsson, MD;  Location: [facility];  Service: Neurosurgery;  Laterality: N/A;   RETINAL DETACHMENT SURGERY Right 2013   ROBOTIC ASSITED PARTIAL NEPHRECTOMY Right 02/05/2015   Procedure: ROBOTIC ASSITED PARTIAL NEPHRECTOMY;  Surgeon: Raynelle Bring, MD;  Location: [facility];  Service: Urology;  Laterality: Right;   SIMPLE MASTECTOMY WITH AXILLARY SENTINEL NODE BIOPSY Right 07/28/2013   Procedure: RIGHT TOTAL  MASTECTOMY;  Surgeon: Imogene Burn. Tsuei, MD;  Location: [facility];  Service: General;  Laterality: Right;   TONSILLECTOMY  AGE 83   TOTAL ABDOMINAL HYSTERECTOMY W/ BILATERAL SALPINGOOPHORECTOMY  1997    Current Outpatient Medications  Medication Sig Dispense Refill   acalabrutinib maleate (CALQUENCE) 100 MG tablet Take 1 tablet by mouth 2 (two) times daily. 60 tablet 1   allopurinol (ZYLOPRIM) 300 MG tablet TAKE 1 TABLET(300 MG) BY MOUTH DAILY (Patient taking differently: Take 300 mg by mouth daily.) 90 tablet 4   amitriptyline (ELAVIL) 100 MG tablet Take 100 mg by mouth at bedtime.     Apoaequorin (PREVAGEN PO) Take by mouth daily.     aspirin 81 MG EC tablet Take 1 tablet by mouth daily.     brexpiprazole (REXULTI) 1 MG TABS tablet Take 1 mg by mouth daily.     buPROPion (WELLBUTRIN XL) 300 MG 24 hr tablet Take 300 mg by mouth daily.     CALCIUM CITRATE PO Take 1 tablet by mouth daily.     Cholecalciferol (VITAMIN D3) 50 MCG (2000 UT) TABS Take 1 tablet by mouth daily.     Cyanocobalamin (VITAMIN B12) 1000 MCG TBCR Take 1 tablet by mouth daily.     diclofenac Sodium (VOLTAREN) 1 % GEL Apply 2 g topically 4 (four) times daily. (Patient taking differently: Apply 2 g topically daily as needed (for pain).) 2 g 3   DULoxetine (CYMBALTA) 60 MG capsule Take 60 mg by mouth 2 (two) times daily.     ferrous sulfate 325 (65 FE) MG tablet Take 325 mg by mouth daily with breakfast.     gabapentin (NEURONTIN) 100 MG capsule Take 100 mg by mouth at bedtime.     GARLIC PO Take 1 tablet by mouth daily.      levothyroxine (SYNTHROID, LEVOTHROID) 88 MCG tablet Take 88 mcg by mouth daily before breakfast.  4   Multiple Vitamins-Minerals (WOMENS 50+ MULTI VITAMIN/MIN) TABS Take 1 tablet by mouth daily.     omega-3 acid ethyl esters (LOVAZA) 1 g capsule Take 1 g by mouth 2 (two) times daily.     omeprazole (PRILOSEC) 40 MG capsule TAKE 1 CAPSULE(40 MG) BY MOUTH DAILY AFTER BREAKFAST (Patient taking differently: Take 40 mg by mouth daily.) 90 capsule 0   rosuvastatin (CRESTOR) 5 MG tablet TAKE 1 TABLET(5 MG) BY MOUTH DAILY (Patient taking differently: Take 5 mg  by mouth daily.) 90 tablet 2   topiramate (TOPAMAX) 25 MG tablet Take 1 tablet (25 mg total) by mouth at bedtime. (Patient taking differently: Take 25 mg by mouth every evening.) 90 tablet 3   UNABLE TO FIND Take 1 tablet by mouth 3 (three) times daily. Med Name: Metatrim     zinc gluconate 50 MG tablet Take 50 mg by mouth daily.     lidocaine (XYLOCAINE) 2 % solution Use as directed 15 mLs in the mouth or throat every 4 (four) hours as needed for mouth pain. (Patient not taking: Reported on 06/28/2022) 100 mL 1   No current facility-administered medications for this visit.    Family History  Problem Relation Age of Onset   Heart disease Maternal Grandfather    Diabetes Maternal Grandfather    Colon cancer Maternal Aunt 77   Brain cancer Paternal Grandmother        dx in 78s   Dementia Mother    Diabetes Mother    Osteoporosis Mother    Diabetes Maternal Aunt    Prostate cancer Father 22   Bipolar disorder Maternal Aunt    Stroke Maternal Aunt    Stomach cancer Paternal Uncle        dx in late 60s    Review of Systems  Constitutional: Negative.   Genitourinary: Negative.     Exam:   BP (!) 115/43 (BP Location: Left Arm, Patient Position: Sitting, Cuff Size: Normal)   Pulse 81   Ht '5\' 3"'$  (1.6 m) Comment: Reported  Wt 161 lb 9.6 oz (73.3 kg)   LMP 08/26/1995   BMI 28.63 kg/m   Height: '5\' 3"'$  (160 cm) (Reported)  General  appearance: alert, cooperative and appears stated age Breasts: surgically absent right breast, no masses or LAD noted; left breast without masses, skin changes, nipple discharge or LAD Abdomen: soft, non-tender; bowel sounds normal; no masses,  no organomegaly Lymph nodes: Cervical, supraclavicular, and axillary nodes normal.  No abnormal inguinal nodes palpated Neurologic: Grossly normal  Pelvic: External genitalia:  no lesions              Urethra:  normal appearing urethra with no masses, tenderness or lesions              Bartholins and Skenes: normal                 Vagina: normal appearing vagina with atrophic changes and no discharge, no lesions              Cervix: surgically absent              Pap taken: No. Bimanual Exam:  Uterus:  surgically absent              Adnexa: no masses or fullness noted               Rectovaginal: Confirms               Anus:  normal sphincter tone, no lesions  Chaperone, Octaviano Batty, CMA, was present for exam.  Assessment/Plan: 1. GYN exam for high-risk Medicare patient - Pap smear not indicated - Mammogram 06/09/2022 - Colonoscopy 11/03/2014 - Bone mineral density 05/30/2021 - lab work done with PCP, Dr. Shelia Media - vaccines reviewed/updated - we did discuss stopping pelvic exams unless having new issues.  Pt will consider.    2. History of TAH/BSO  3. Vaginal atrophy  4. Malignant neoplasm of lower-outer quadrant of right breast of  female, estrogen receptor positive ([facility]) - h/o breast cancer hx 1997, s/p lumpectomy, radiation and 5 years of Tamoxifen. Recurrence 2014.  Had mastectomy and then anastrozole.  Off now.  5. CLL (chronic lymphocytic leukemia) ([facility]) - has just started treatment

## 2022-07-23 ENCOUNTER — Other Ambulatory Visit (HOSPITAL_COMMUNITY): Payer: Self-pay

## 2022-07-24 ENCOUNTER — Telehealth: Payer: Self-pay

## 2022-07-24 ENCOUNTER — Telehealth: Payer: Self-pay | Admitting: *Deleted

## 2022-07-24 NOTE — Telephone Encounter (Signed)
...     Pre-operative Risk Assessment    Patient Name: [name] [name]  DOB: 10/23/1945 MRN: [id number]      Request for Surgical Clearance    Procedure:   EXPLORATION OF FUSION T6-T9 WITH PEDICLE SCREWS  Date of Surgery:  Clearance 09/16/22                                 Surgeon:  DR Earnie Larsson Surgeon's Group or Practice Name:  [facility] Phone number:  [phone] Fax number:  [phone]   Type of Clearance Requested:   - Medical  - Pharmacy:  Hold Aspirin     Type of Anesthesia:  General    Additional requests/questions:    Gwenlyn Found   07/24/2022, 2:39 PM

## 2022-07-24 NOTE — Telephone Encounter (Signed)
Set up pt for telephone clearance. Med rec and consent done.

## 2022-07-24 NOTE — Telephone Encounter (Signed)
   Name: [name] [name]  DOB: 1945/12/16  MRN: [id number]  Primary Cardiologist: Candee Furbish, MD   Preoperative team, please contact this patient and set up a phone call appointment for further preoperative risk assessment. Please obtain consent and complete medication review. Thank you for your help.  I confirm that guidance regarding antiplatelet and oral anticoagulation therapy has been completed and, if necessary, noted below.  Aspirin is not prescribed by cardiology provider.  Recommendations for holding aspirin will need to come from prescribing provider.   Deberah Pelton, NP 07/24/2022, 3:54 PM [facility]

## 2022-07-24 NOTE — Telephone Encounter (Signed)
  Patient Consent for Virtual Visit         [name] [name] has provided verbal consent on 07/24/2022 for a virtual visit (video or telephone).   CONSENT FOR VIRTUAL VISIT FOR:  [name] [name]  By participating in this virtual visit I agree to the following:  I hereby voluntarily request, consent and authorize [facility] and its employed or contracted physicians, physician assistants, nurse practitioners or other licensed health care professionals (the Practitioner), to provide me with telemedicine health care services (the "Services") as deemed necessary by the treating Practitioner. I acknowledge and consent to receive the Services by the Practitioner via telemedicine. I understand that the telemedicine visit will involve communicating with the Practitioner through live audiovisual communication technology and the disclosure of certain medical information by electronic transmission. I acknowledge that I have been given the opportunity to request an in-person assessment or other available alternative prior to the telemedicine visit and am voluntarily participating in the telemedicine visit.  I understand that I have the right to withhold or withdraw my consent to the use of telemedicine in the course of my care at any time, without affecting my right to future care or treatment, and that the Practitioner or I may terminate the telemedicine visit at any time. I understand that I have the right to inspect all information obtained and/or recorded in the course of the telemedicine visit and may receive copies of available information for a reasonable fee.  I understand that some of the potential risks of receiving the Services via telemedicine include:  Delay or interruption in medical evaluation due to technological equipment failure or disruption; Information transmitted may not be sufficient (e.g. poor resolution of images) to allow for appropriate medical decision making by the  Practitioner; and/or  In rare instances, security protocols could fail, causing a breach of personal health information.  Furthermore, I acknowledge that it is my responsibility to provide information about my medical history, conditions and care that is complete and accurate to the best of my ability. I acknowledge that Practitioner's advice, recommendations, and/or decision may be based on factors not within their control, such as incomplete or inaccurate data provided by me or distortions of diagnostic images or specimens that may result from electronic transmissions. I understand that the practice of medicine is not an exact science and that Practitioner makes no warranties or guarantees regarding treatment outcomes. I acknowledge that a copy of this consent can be made available to me via my patient portal ([facility]), or I can request a printed copy by calling the office of [facility].    I understand that my insurance will be billed for this visit.   I have read or had this consent read to me. I understand the contents of this consent, which adequately explains the benefits and risks of the Services being provided via telemedicine.  I have been provided ample opportunity to ask questions regarding this consent and the Services and have had my questions answered to my satisfaction. I give my informed consent for the services to be provided through the use of telemedicine in my medical care

## 2022-07-28 ENCOUNTER — Telehealth: Payer: Self-pay | Admitting: Pulmonary Disease

## 2022-07-28 NOTE — Telephone Encounter (Signed)
Fax received from Dr. Earnie Larsson with [facility] to perform a Exploration of fusion T6-T9 with pedicle screws with general anesthesia on patient.  Patient needs surgery clearance. Surgery is 09/16/2022. Patient was seen on 03/18/2021. Office protocol is a risk assessment can be sent to surgeon if patient has been seen in 60 days or less.   Sending to Dr Vaughan Browner for risk assessment or recommendations if patient needs to be seen in office prior to surgical procedure.    On AVS it states it return as needed to clinic. I called patient to see how she was doing and she states that she is doing well and has no issues or complaints. Sir do you still want to see her to get cleared.

## 2022-07-29 ENCOUNTER — Ambulatory Visit: Payer: Medicare Other | Admitting: Physical Medicine and Rehabilitation

## 2022-08-04 NOTE — Telephone Encounter (Signed)
Sent patient a mychart message to set up follow up visit for surgical clearance

## 2022-08-05 ENCOUNTER — Encounter: Payer: Self-pay | Admitting: Pulmonary Disease

## 2022-08-05 NOTE — Progress Notes (Signed)
PCCM note  Asked to provide preoperative assessment for patient undergoing Exploration of fusion T6-T9 with pedicle screws with general anesthesia   Patient was originally seen in clinic for post-COVID cough and dyspnea with recovery back to baseline.  PFTs in 2022 were normal.  She had a CT scan with no significant interstitial lung disease but has minimal bronchiectasis which is nonspecific Suspect dyspnea on exertion secondary to deconditioning and body habitus Advised weight loss with diet and exercise   Patient is at low risk for perioperative pulmonary complication with no contraindication to surgery  ------------------- Peri-operative Assessment of Pulmonary Risk for Non-Thoracic Surgery:  ForMs. Lorensen, risk of perioperative pulmonary complications is increased by:  Age greater than 55 years    Respiratory complications generally occur in 1% of ASA Class I patients, 5% of ASA Class II and 10% of ASA Class III-IV patients These complications rarely result in mortality and iclude postoperative pneumonia, atelectasis, pulmonary embolism, ARDS and increased time requiring postoperative mechanical ventilation.  Overall, I recommend proceeding with the surgery if the risk for respiratory complications are outweighed by the potential benefits. This will need to be discussed between the patient and surgeon.  To reduce risks of respiratory complications, I recommend: --Pre- and post-operative incentive spirometry performed frequently while awake --Avoiding use of pancuronium during anesthesia.  Marshell Garfinkel MD [facility] See Amion for pager  If no response to pager , please call [phone] until 7pm After 7:00 pm call [facility]  [phone] 08/05/2022, 1:37 AM

## 2022-08-05 NOTE — Telephone Encounter (Signed)
I put in a separate note for surgical clearance

## 2022-08-05 NOTE — Telephone Encounter (Signed)
OV notes and clearance form have been faxed back to [facility]. Nothing further needed at this time.

## 2022-08-06 ENCOUNTER — Inpatient Hospital Stay: Payer: Medicare Other

## 2022-08-06 ENCOUNTER — Other Ambulatory Visit: Payer: Self-pay | Admitting: Hematology and Oncology

## 2022-08-06 ENCOUNTER — Other Ambulatory Visit: Payer: Self-pay

## 2022-08-06 ENCOUNTER — Inpatient Hospital Stay: Payer: Medicare Other | Attending: Hematology and Oncology | Admitting: Hematology and Oncology

## 2022-08-06 VITALS — BP 127/58 | HR 89 | Temp 98.4°F | Resp 16 | Wt 157.4 lb

## 2022-08-06 DIAGNOSIS — C9110 Chronic lymphocytic leukemia of B-cell type not having achieved remission: Secondary | ICD-10-CM | POA: Diagnosis not present

## 2022-08-06 DIAGNOSIS — R059 Cough, unspecified: Secondary | ICD-10-CM | POA: Diagnosis not present

## 2022-08-06 DIAGNOSIS — Z170 Estrogen receptor positive status [ER+]: Secondary | ICD-10-CM | POA: Diagnosis not present

## 2022-08-06 DIAGNOSIS — Z7982 Long term (current) use of aspirin: Secondary | ICD-10-CM | POA: Insufficient documentation

## 2022-08-06 DIAGNOSIS — Z79899 Other long term (current) drug therapy: Secondary | ICD-10-CM | POA: Diagnosis not present

## 2022-08-06 DIAGNOSIS — Z9011 Acquired absence of right breast and nipple: Secondary | ICD-10-CM | POA: Insufficient documentation

## 2022-08-06 DIAGNOSIS — Z853 Personal history of malignant neoplasm of breast: Secondary | ICD-10-CM | POA: Diagnosis not present

## 2022-08-06 DIAGNOSIS — C50511 Malignant neoplasm of lower-outer quadrant of right female breast: Secondary | ICD-10-CM

## 2022-08-06 LAB — CBC WITH DIFFERENTIAL (CANCER CENTER ONLY)
Abs Immature Granulocytes: 0.05 10*3/uL (ref 0.00–0.07)
Basophils Absolute: 0.1 10*3/uL (ref 0.0–0.1)
Basophils Relative: 0 %
Eosinophils Absolute: 0.3 10*3/uL (ref 0.0–0.5)
Eosinophils Relative: 1 %
HCT: 31.2 % — ABNORMAL LOW (ref 36.0–46.0)
Hemoglobin: 9.9 g/dL — ABNORMAL LOW (ref 12.0–15.0)
Immature Granulocytes: 0 %
Lymphocytes Relative: 86 %
Lymphs Abs: 35.4 10*3/uL — ABNORMAL HIGH (ref 0.7–4.0)
MCH: 31.2 pg (ref 26.0–34.0)
MCHC: 31.7 g/dL (ref 30.0–36.0)
MCV: 98.4 fL (ref 80.0–100.0)
Monocytes Absolute: 0.9 10*3/uL (ref 0.1–1.0)
Monocytes Relative: 2 %
Neutro Abs: 4.7 10*3/uL (ref 1.7–7.7)
Neutrophils Relative %: 11 %
Platelet Count: 247 10*3/uL (ref 150–400)
RBC: 3.17 MIL/uL — ABNORMAL LOW (ref 3.87–5.11)
RDW: 14.6 % (ref 11.5–15.5)
Smear Review: NORMAL
WBC Count: 41.4 10*3/uL — ABNORMAL HIGH (ref 4.0–10.5)
nRBC: 0 % (ref 0.0–0.2)

## 2022-08-06 LAB — CMP (CANCER CENTER ONLY)
ALT: 15 U/L (ref 0–44)
AST: 18 U/L (ref 15–41)
Albumin: 4.2 g/dL (ref 3.5–5.0)
Alkaline Phosphatase: 69 U/L (ref 38–126)
Anion gap: 5 (ref 5–15)
BUN: 17 mg/dL (ref 8–23)
CO2: 26 mmol/L (ref 22–32)
Calcium: 9.7 mg/dL (ref 8.9–10.3)
Chloride: 104 mmol/L (ref 98–111)
Creatinine: 0.75 mg/dL (ref 0.44–1.00)
GFR, Estimated: >60 mL/min (ref >60)
Glucose, Bld: 89 mg/dL (ref 70–99)
Potassium: 4.8 mmol/L (ref 3.5–5.1)
Sodium: 135 mmol/L (ref 135–145)
Total Bilirubin: 0.3 mg/dL (ref 0.3–1.2)
Total Protein: 5.8 g/dL — ABNORMAL LOW (ref 6.5–8.1)

## 2022-08-07 ENCOUNTER — Other Ambulatory Visit: Payer: Self-pay | Admitting: Hematology and Oncology

## 2022-08-07 DIAGNOSIS — C50011 Malignant neoplasm of nipple and areola, right female breast: Secondary | ICD-10-CM

## 2022-08-07 DIAGNOSIS — S92912A Unspecified fracture of left toe(s), initial encounter for closed fracture: Secondary | ICD-10-CM | POA: Diagnosis not present

## 2022-08-07 DIAGNOSIS — C9110 Chronic lymphocytic leukemia of B-cell type not having achieved remission: Secondary | ICD-10-CM

## 2022-08-07 NOTE — Progress Notes (Signed)
Virtual Visit via Telephone Note   Because of Momoka Stringfield Vanhise's co-morbid illnesses, she is at least at moderate risk for complications without adequate follow up.  This format is felt to be most appropriate for this patient at this time.  The patient did not have access to video technology/had technical difficulties with video requiring transitioning to audio format only (telephone).  All issues noted in this document were discussed and addressed.  No physical exam could be performed with this format.  Please refer to the patient's chart for her consent to telehealth for [facility].  Evaluation Performed:  Preoperative cardiovascular risk assessment _____________   Date:  08/07/2022   Patient ID:  MAGIN BALBI, DOB 24-Jun-1946, MRN [id number] Patient Location:  Home Provider location:   Office  Primary Care Provider:  Deland Pretty, MD Primary Cardiologist:  Candee Furbish, MD  Chief Complaint / Patient Profile   76 y.o. y/o female with a h/o HTN, breast cancer s/p XRT and chemotherapy 2017, orthostatic hypotension, shortness of breath with multiple echoes showing stable normal LV function, coronary calcium score of 12 with no evidence of flow-limiting CAD, small areas of proximal LAD calcification without any surrounding stenosis, HLD who is pending exploration of fusion T6-T9 with pedicle screws and presents today for telephonic preoperative cardiovascular risk assessment.  History of Present Illness    MARQUELLE BALOW is a 76 y.o. female who presents via audio/video conferencing for a telehealth visit today.  Pt was last seen in cardiology clinic on 10/30/21 by Dr. Marlou Porch.  At that time TRISA CRANOR was doing well. The patient is now pending procedure as outlined above. Since her last visit, she denies chest pain, shortness of breath, lower extremity edema, fatigue, palpitations, melena, hematuria, hemoptysis, diaphoresis, weakness, presyncope, syncope, orthopnea, and  PND. Patient is limited by back pain but she is able to achieve > 4 METS activity without concerning cardiac symptoms. States that she can walk further and feels more confident when she is holding on to something such as the grocery cart.   Past Medical History    Past Medical History:  Diagnosis Date   Alopecia    Anxiety    Arthritis    "spine" (07/28/2013)   Breast cancer ([place]) 1997; 2014   right   CAP (community acquired pneumonia)    admission 06-04-2013, failed outpatient   Chronic back pain    "lower back and upper neck" (07/28/2013)   CLL (chronic lymphocytic leukemia) ([place]) 05/2013   DDD (degenerative disc disease)    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 ear    Depression    Diverticulosis    Elevated LFTs    Fibromyalgia 1978   GERD (gastroesophageal reflux disease)    Hematuria    History of syncope    episode 2008--  no recurrence since   HLD (hyperlipidemia)    Hypothyroidism    Kidney stones 2014   Plantar fasciitis    Ruptured disk    one in neck and two in back   S/P chemotherapy, time since greater than 12 weeks    Sinus tachycardia    mild resting   Skin cancer    nose   Stool incontinence    "at times recently"    Past Surgical History:  Procedure Laterality Date   ANTERIOR LATERAL LUMBAR FUSION 4 LEVELS Left 04/25/2016   Procedure: LEFT LUMBAR ONE-TWO, LUMBAR TWO-THREE, LUMBAR THREE-FOUR, LUMBAR FOUR-FIVE ANTERIOR LATERAL LUMBAR FUSION;  Surgeon: Earnie Larsson, MD;  Location: [facility]  NEURO ORS;  Service: Neurosurgery;  Laterality: Left;   APPENDECTOMY  6789   APPLICATION OF ROBOTIC ASSISTANCE FOR SPINAL PROCEDURE N/A 04/06/2017   Procedure: APPLICATION OF ROBOTIC ASSISTANCE FOR SPINAL PROCEDURE;  Surgeon: Earnie Larsson, MD;  Location: [place];  Service: Neurosurgery;  Laterality: N/A;   BREAST BIOPSY Right 2014   BREAST LUMPECTOMY Right 1997   BREAST LUMPECTOMY WITH AXILLARY LYMPH NODE DISSECTION Right 1997   CATARACT EXTRACTION, BILATERAL  2019   Dr. Kathlen Mody   CYSTOSCOPY WITH  RETROGRADE PYELOGRAM, URETEROSCOPY AND STENT PLACEMENT Bilateral 06/17/2013   Procedure: CYSTOSCOPY WITH RETROGRADE PYELOGRAM, URETEROSCOPY AND LEFT DOUBLE  J STENT PLACEMENT RIGHT URETERAL HOLMIIUM LASER AND DOUBLE J STENT ;  Surgeon: Hanley Ben, MD;  Location: [place];  Service: Urology;  Laterality: Bilateral;   HOLMIUM LASER APPLICATION Bilateral 38/05/1750   Procedure: HOLMIUM LASER APPLICATION;  Surgeon: Hanley Ben, MD;  Location: [place];  Service: Urology;  Laterality: Bilateral;   LITHOTRIPSY Left 06/2013   LUMBAR EPIDURAL INJECTION     has had 7 injections   PORT-A-CATH REMOVAL Left 10/03/2014   Procedure: REMOVAL PORT-A-CATH;  Surgeon: Donnie Mesa, MD;  Location: [place];  Service: General;  Laterality: Left;   PORTACATH PLACEMENT Left 07/28/2013   Procedure: ATTEMPTED INSERTION PORT-A-CATH;  Surgeon: Imogene Burn. Georgette Dover, MD;  Location: [place];  Service: General;  Laterality: Left;   POSTERIOR LUMBAR FUSION 4 LEVEL N/A 04/25/2016   Procedure: LUMBAR FIVE-SACRAL ONE POSTERIOR LUMBAR INTERBODY FUSION, THORACIC NINE-SACRAL ONE POSTERIOR LATERAL ARTHRODESIS WITH PEDICLE SCREWS;  Surgeon: Earnie Larsson, MD;  Location: [place] NEURO ORS;  Service: Neurosurgery;  Laterality: N/A;   RETINAL DETACHMENT SURGERY Right 2013   ROBOTIC ASSITED PARTIAL NEPHRECTOMY Right 02/05/2015   Procedure: ROBOTIC ASSITED PARTIAL NEPHRECTOMY;  Surgeon: Raynelle Bring, MD;  Location: [facility];  Service: Urology;  Laterality: Right;   SIMPLE MASTECTOMY WITH AXILLARY SENTINEL NODE BIOPSY Right 07/28/2013   Procedure: RIGHT TOTAL  MASTECTOMY;  Surgeon: Imogene Burn. Tsuei, MD;  Location: [place];  Service: General;  Laterality: Right;   TONSILLECTOMY  AGE 10   TOTAL ABDOMINAL HYSTERECTOMY W/ BILATERAL SALPINGOOPHORECTOMY  1997    Allergies  Allergies  Allergen Reactions   Lasix [Furosemide] Nausea And Vomiting and Other (See Comments)    HEADACHE   Lithium Other (See  Comments)    Dizziness, "cause me to fall"   Sulfa Antibiotics Hives   Trazodone And Nefazodone Other (See Comments)    insomnia   Lyrica [Pregabalin] Diarrhea, Nausea Only and Rash    Home Medications    Prior to Admission medications   Medication Sig Start Date End Date Taking? Authorizing Provider  acalabrutinib maleate (CALQUENCE) 100 MG tablet Take 1 tablet by mouth 2 (two) times daily. 06/17/22   Orson Slick, MD  allopurinol (ZYLOPRIM) 300 MG tablet TAKE 1 TABLET(300 MG) BY MOUTH DAILY Patient taking differently: Take 300 mg by mouth daily. 09/03/21   Orson Slick, MD  amitriptyline (ELAVIL) 100 MG tablet Take 100 mg by mouth at bedtime. 09/25/21   [provider]  Apoaequorin (PREVAGEN PO) Take by mouth daily.    [provider]  aspirin 81 MG EC tablet Take 1 tablet by mouth daily.    [provider]  benzonatate (TESSALON) 200 MG capsule Take 200 mg by mouth 3 (three) times daily.    [provider]  brexpiprazole (REXULTI) 1 MG TABS tablet Take 1 mg by mouth daily.  [provider]  buPROPion (WELLBUTRIN XL) 300 MG 24 hr tablet Take 300 mg by mouth daily. 08/30/19   [provider]  CALCIUM CITRATE PO Take 1 tablet by mouth daily.    [provider]  Cholecalciferol (VITAMIN D3) 50 MCG (2000 UT) TABS Take 1 tablet by mouth daily.    [provider]  Cyanocobalamin (VITAMIN B12) 1000 MCG TBCR Take 1 tablet by mouth daily.    [provider]  diclofenac Sodium (VOLTAREN) 1 % GEL Apply 2 g topically 4 (four) times daily. Patient taking differently: Apply 2 g topically daily as needed (for pain). 05/27/22   Raulkar, Clide Deutscher, MD  DULoxetine (CYMBALTA) 60 MG capsule Take 60 mg by mouth 2 (two) times daily. 08/30/19   [provider]  ferrous sulfate 325 (65 FE) MG tablet Take 325 mg by mouth daily with breakfast.    [provider]  gabapentin (NEURONTIN) 100 MG capsule Take 100  mg by mouth at bedtime. 08/20/21   [provider]  levothyroxine (SYNTHROID, LEVOTHROID) 88 MCG tablet Take 88 mcg by mouth daily before breakfast. 06/30/17   [provider]  Multiple Vitamins-Minerals (WOMENS 50+ MULTI VITAMIN/MIN) TABS Take 1 tablet by mouth daily.    [provider]  omeprazole (PRILOSEC) 40 MG capsule TAKE 1 CAPSULE(40 MG) BY MOUTH DAILY AFTER BREAKFAST Patient taking differently: Take 40 mg by mouth daily. 12/24/21   Orson Slick, MD  rosuvastatin (CRESTOR) 5 MG tablet TAKE 1 TABLET(5 MG) BY MOUTH DAILY Patient taking differently: Take 5 mg by mouth daily. 02/20/22   Jerline Pain, MD  topiramate (TOPAMAX) 25 MG tablet Take 1 tablet (25 mg total) by mouth at bedtime. Patient taking differently: Take 25 mg by mouth every evening. 05/27/22   Raulkar, Clide Deutscher, MD  UNABLE TO FIND Take 1 tablet by mouth 3 (three) times daily. Med Name: Metatrim    [provider]  zinc gluconate 50 MG tablet Take 50 mg by mouth daily.    [provider]    Physical Exam    Vital Signs:  CAMISHA SREY does not have vital signs available for review today.  Given telephonic nature of communication, physical exam is limited. AAOx3. NAD. Normal affect.  Speech and respirations are unlabored.  Accessory Clinical Findings    None  Assessment & Plan    1.  Preoperative Cardiovascular Risk Assessment:  The patient was advised that if she develops new symptoms prior to surgery to contact our office to arrange for a follow-up visit, and she verbalized understanding.  Aspirin is not prescribed by cardiology provider. Recommendations for holding aspirin will need to come from prescribing provider.   A copy of this note will be routed to requesting surgeon.  Time:   Today, I have spent 10 minutes with the patient with telehealth technology discussing medical history, symptoms, and management plan.     Emmaline Life,  NP-C  08/08/2022, 2:18 PM [address] Office [phone] Fax [phone]

## 2022-08-08 ENCOUNTER — Encounter: Payer: Self-pay | Admitting: Nurse Practitioner

## 2022-08-08 ENCOUNTER — Ambulatory Visit: Payer: Medicare Other | Attending: Cardiovascular Disease | Admitting: Nurse Practitioner

## 2022-08-08 DIAGNOSIS — Z01810 Encounter for preprocedural cardiovascular examination: Secondary | ICD-10-CM | POA: Diagnosis not present

## 2022-08-11 DIAGNOSIS — M5414 Radiculopathy, thoracic region: Secondary | ICD-10-CM | POA: Diagnosis not present

## 2022-08-14 ENCOUNTER — Other Ambulatory Visit (HOSPITAL_COMMUNITY): Payer: Self-pay

## 2022-08-15 ENCOUNTER — Other Ambulatory Visit (HOSPITAL_COMMUNITY): Payer: Self-pay

## 2022-08-15 ENCOUNTER — Other Ambulatory Visit: Payer: Self-pay | Admitting: Hematology and Oncology

## 2022-08-15 DIAGNOSIS — C9110 Chronic lymphocytic leukemia of B-cell type not having achieved remission: Secondary | ICD-10-CM

## 2022-08-15 MED ORDER — CALQUENCE 100 MG PO TABS
100.0000 mg | ORAL_TABLET | Freq: Two times a day (BID) | ORAL | 1 refills | Status: DC
  Filled 2022-08-15: qty 60, 30d supply, fill #0

## 2022-08-18 ENCOUNTER — Encounter: Payer: Self-pay | Admitting: Oncology

## 2022-08-18 NOTE — Progress Notes (Signed)
[place] [phone]   [phone]  PROGRESS NOTE  Patient Care Team: Deland Pretty, MD as PCP - General (Internal Medicine) Jerline Pain, MD as PCP - Cardiology (Cardiology) Milus Banister, MD as Attending Physician (Gastroenterology) Megan Salon, MD as Consulting Physician (Gynecology) Donnie Mesa, MD as Consulting Physician (General Surgery) Noemi Chapel, NP as Nurse Practitioner Rod Can, MD as Consulting Physician (Orthopedic Surgery) Latanya Maudlin, MD as Consulting Physician (Orthopedic Surgery) Ria Clock, MD as Attending Physician (Radiology) Adele Dan, MD as Referring Physician (Orthopedic Surgery) Orson Slick, MD as Consulting Physician (Hematology and Oncology)  Hematological/Oncological History # CLL Rai Stage 1  06/04/2021: last visit with Dr. Jana Hakim. Detailed history of his care history noted below.  09/18/2021: WBC 43.3, Hgb 13.0, MCV 87.1, Plt 275 09/23/2021: establish care with Dr. Lorenso Courier. WBC 30.1, Hgb 12.3, MCV 87.8, Plt 241 03/11/2022: WBC 57.4, Hgb 11.2, MCV 88.3, Plt 181  04/29/2022: Started acalabrutinib 100 mg PO twice daily  Interval History:  [name] [name] 76 y.o. 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 The patient's last visit was on 06/06/2022. In the interim since the last visit she has continued acalbrutinib therapy.   On exam today [name] [name] is accompanied by her husband. She reports she has been having a cough.  She does not have headaches but has a "pounding sensation in her head".  She notes that her cough is a relatively new issue and she is producing a dry cough.  She notes that she feels like there is something "sitting in her throat".  She notes that her energy levels are not "to the greatest".  She is having workup with a spine specialist due to her continued back pain.  She reports that she  is not having any trouble with nausea, vomiting, or diarrhea.  She is not having any palpitations or overt signs of bleeding.  She has had no infections and no concern for lymphadenopathy.  She denies fevers, chills, sweats, shortness of breath, chest pain or cough.She has no other complaints. Rest of 10 point ROS is listed below.  Overall she is willing and able to proceed with acalabrutinib therapy at this time.   MEDICAL HISTORY:  Past Medical History:  Diagnosis Date   Alopecia    Anxiety    Arthritis    "spine" (07/28/2013)   Breast cancer ([facility]) 1997; 2014   right   CAP (community acquired pneumonia)    admission 06-04-2013, failed outpatient   Chronic back pain    "lower back and upper neck" (07/28/2013)   CLL (chronic lymphocytic leukemia) ([place]) 05/2013   DDD (degenerative disc disease)    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 ear    Depression    Diverticulosis    Elevated LFTs    Fibromyalgia 1978   GERD (gastroesophageal reflux disease)    Hematuria    History of syncope    episode 2008--  no recurrence since   HLD (hyperlipidemia)    Hypothyroidism    Kidney stones 2014   Plantar fasciitis    Ruptured disk    one in neck and two in back   S/P chemotherapy, time since greater than 12 weeks    Sinus tachycardia    mild resting   Skin cancer    nose   Stool incontinence    "at times recently"     SURGICAL HISTORY: Past Surgical  History:  Procedure Laterality Date   ANTERIOR LATERAL LUMBAR FUSION 4 LEVELS Left 04/25/2016   Procedure: LEFT LUMBAR ONE-TWO, LUMBAR TWO-THREE, LUMBAR THREE-FOUR, LUMBAR FOUR-FIVE ANTERIOR LATERAL LUMBAR FUSION;  Surgeon: Earnie Larsson, MD;  Location: [facility];  Service: Neurosurgery;  Laterality: Left;   APPENDECTOMY  1751   APPLICATION OF ROBOTIC ASSISTANCE FOR SPINAL PROCEDURE N/A 04/06/2017   Procedure: APPLICATION OF ROBOTIC ASSISTANCE FOR SPINAL PROCEDURE;  Surgeon: Earnie Larsson, MD;  Location: [facility];  Service: Neurosurgery;  Laterality: N/A;    BREAST BIOPSY Right 2014   BREAST LUMPECTOMY Right 1997   BREAST LUMPECTOMY WITH AXILLARY LYMPH NODE DISSECTION Right 1997   CATARACT EXTRACTION, BILATERAL  2019   Dr. Kathlen Mody   CYSTOSCOPY WITH RETROGRADE PYELOGRAM, URETEROSCOPY AND STENT PLACEMENT Bilateral 06/17/2013   Procedure: CYSTOSCOPY WITH RETROGRADE PYELOGRAM, URETEROSCOPY AND LEFT DOUBLE  J STENT PLACEMENT RIGHT URETERAL HOLMIIUM LASER AND DOUBLE J STENT ;  Surgeon: Hanley Ben, MD;  Location: [facility];  Service: Urology;  Laterality: Bilateral;   HOLMIUM LASER APPLICATION Bilateral 02/58/5277   Procedure: HOLMIUM LASER APPLICATION;  Surgeon: Hanley Ben, MD;  Location: [facility];  Service: Urology;  Laterality: Bilateral;   LITHOTRIPSY Left 06/2013   LUMBAR EPIDURAL INJECTION     has had 7 injections   PORT-A-CATH REMOVAL Left 10/03/2014   Procedure: REMOVAL PORT-A-CATH;  Surgeon: Donnie Mesa, MD;  Location: [facility];  Service: General;  Laterality: Left;   PORTACATH PLACEMENT Left 07/28/2013   Procedure: ATTEMPTED INSERTION PORT-A-CATH;  Surgeon: Imogene Burn. Georgette Dover, MD;  Location: [facility];  Service: General;  Laterality: Left;   POSTERIOR LUMBAR FUSION 4 LEVEL N/A 04/25/2016   Procedure: LUMBAR FIVE-SACRAL ONE POSTERIOR LUMBAR INTERBODY FUSION, THORACIC NINE-SACRAL ONE POSTERIOR LATERAL ARTHRODESIS WITH PEDICLE SCREWS;  Surgeon: Earnie Larsson, MD;  Location: [facility];  Service: Neurosurgery;  Laterality: N/A;   RETINAL DETACHMENT SURGERY Right 2013   ROBOTIC ASSITED PARTIAL NEPHRECTOMY Right 02/05/2015   Procedure: ROBOTIC ASSITED PARTIAL NEPHRECTOMY;  Surgeon: Raynelle Bring, MD;  Location: [facility];  Service: Urology;  Laterality: Right;   SIMPLE MASTECTOMY WITH AXILLARY SENTINEL NODE BIOPSY Right 07/28/2013   Procedure: RIGHT TOTAL  MASTECTOMY;  Surgeon: Imogene Burn. Georgette Dover, MD;  Location: [facility];  Service: General;  Laterality: Right;   TONSILLECTOMY  AGE 17   TOTAL ABDOMINAL  HYSTERECTOMY W/ BILATERAL SALPINGOOPHORECTOMY  1997    SOCIAL HISTORY: Social History   Socioeconomic History   Marital status: Married    Spouse name: Philip   Number of children: 1   Years of education: Not on file   Highest education level: Not on file  Occupational History   Occupation: homemaker  Tobacco Use   Smoking status: Never   Smokeless tobacco: Never  Vaping Use   Vaping Use: Never used  Substance and Sexual Activity   Alcohol use: No   Drug use: No   Sexual activity: Not Currently    Partners: Male    Birth control/protection: Surgical    Comment: TAH/BSO  Other Topics Concern   Not on file  Social History Narrative   Not on file   Social Determinants of Health   Financial Resource Strain: Not on file  Food Insecurity: Not on file  Transportation Needs: Not on file  Physical Activity: Not on file  Stress: Not on file  Social Connections: Not on file  Intimate Partner Violence: Not on file    FAMILY HISTORY: Family History  Problem Relation  Age of Onset   Heart disease Maternal Grandfather    Diabetes Maternal Grandfather    Colon cancer Maternal Aunt 48   Brain cancer Paternal Grandmother        dx in 63s   Dementia Mother    Diabetes Mother    Osteoporosis Mother    Diabetes Maternal Aunt    Prostate cancer Father 74   Bipolar disorder Maternal Aunt    Stroke Maternal Aunt    Stomach cancer Paternal Uncle        dx in late 25s    ALLERGIES:  is allergic to lasix [furosemide], lithium, sulfa antibiotics, trazodone and nefazodone, and lyrica [pregabalin].  MEDICATIONS:  Current Outpatient Medications  Medication Sig Dispense Refill   acalabrutinib maleate (CALQUENCE) 100 MG tablet Take 1 tablet by mouth 2 (two) times daily. 60 tablet 1   allopurinol (ZYLOPRIM) 300 MG tablet TAKE 1 TABLET(300 MG) BY MOUTH DAILY (Patient taking differently: Take 300 mg by mouth daily.) 90 tablet 4   amitriptyline (ELAVIL) 100 MG tablet Take 100 mg by  mouth at bedtime.     Apoaequorin (PREVAGEN PO) Take by mouth daily.     aspirin 81 MG EC tablet Take 1 tablet by mouth daily.     benzonatate (TESSALON) 200 MG capsule Take 200 mg by mouth 3 (three) times daily.     brexpiprazole (REXULTI) 1 MG TABS tablet Take 1 mg by mouth daily.     buPROPion (WELLBUTRIN XL) 300 MG 24 hr tablet Take 300 mg by mouth daily.     CALCIUM CITRATE PO Take 1 tablet by mouth daily.     Cholecalciferol (VITAMIN D3) 50 MCG (2000 UT) TABS Take 1 tablet by mouth daily.     Cyanocobalamin (VITAMIN B12) 1000 MCG TBCR Take 1 tablet by mouth daily.     diclofenac Sodium (VOLTAREN) 1 % GEL Apply 2 g topically 4 (four) times daily. (Patient taking differently: Apply 2 g topically daily as needed (for pain).) 2 g 3   DULoxetine (CYMBALTA) 60 MG capsule Take 60 mg by mouth 2 (two) times daily.     ferrous sulfate 325 (65 FE) MG tablet Take 325 mg by mouth daily with breakfast.     gabapentin (NEURONTIN) 100 MG capsule Take 100 mg by mouth at bedtime.     levothyroxine (SYNTHROID, LEVOTHROID) 88 MCG tablet Take 88 mcg by mouth daily before breakfast.  4   Multiple Vitamins-Minerals (WOMENS 50+ MULTI VITAMIN/MIN) TABS Take 1 tablet by mouth daily.     omeprazole (PRILOSEC) 40 MG capsule TAKE 1 CAPSULE(40 MG) BY MOUTH DAILY AFTER BREAKFAST 90 capsule 0   rosuvastatin (CRESTOR) 5 MG tablet TAKE 1 TABLET(5 MG) BY MOUTH DAILY (Patient taking differently: Take 5 mg by mouth daily.) 90 tablet 2   topiramate (TOPAMAX) 25 MG tablet Take 1 tablet (25 mg total) by mouth at bedtime. (Patient taking differently: Take 25 mg by mouth every evening.) 90 tablet 3   UNABLE TO FIND Take 1 tablet by mouth 3 (three) times daily. Med Name: Metatrim     zinc gluconate 50 MG tablet Take 50 mg by mouth daily.     No current facility-administered medications for this visit.    REVIEW OF SYSTEMS:   Constitutional: ( - ) fevers, ( - )  chills , ( - ) night sweats Eyes: ( - ) blurriness of vision, ( -  ) double vision, ( - ) watery eyes Ears, nose, mouth, throat, and face: ( - )  mucositis, ( - ) sore throat Respiratory: ( - ) cough, ( - ) dyspnea, ( - ) wheezes Cardiovascular: ( - ) palpitation, ( - ) chest discomfort, ( - ) lower extremity swelling Gastrointestinal:  ( - ) nausea, ( - ) heartburn, ( - ) change in bowel habits Skin: ( - ) abnormal skin rashes Lymphatics: ( - ) new lymphadenopathy, ( - ) easy bruising Neurological: ( - ) numbness, ( - ) tingling, ( - ) new weaknesses Behavioral/Psych: ( - ) mood change, ( - ) new changes  All other systems were reviewed with the patient and are negative.  PHYSICAL EXAMINATION:  Vitals:   08/06/22 1421  BP: (!) 127/58  Pulse: 89  Resp: 16  Temp: 98.4 F (36.9 C)  SpO2: 100%   Filed Weights   08/06/22 1421  Weight: 157 lb 6.4 oz (71.4 kg)    GENERAL: well appearing elderly Caucasian female. alert, no distress and comfortable SKIN:  skin color, texture, turgor are normal, no rashes or significant lesions EYES: conjunctiva are pink and non-injected, sclera clear NECK: supple, non-tender LYMPH:  no palpable lymphadenopathy in the cervical, axillary or inguinal LUNGS: clear to auscultation and percussion with normal breathing effort HEART: regular rate & rhythm and no murmurs and no lower extremity edema Musculoskeletal: no cyanosis of digits and no clubbing  PSYCH: alert & oriented x 3, fluent speech NEURO: no focal motor/sensory deficits  LABORATORY DATA:  I have reviewed the data as listed    Latest Ref Rng & Units 08/06/2022    1:36 PM 07/08/2022   10:41 AM 06/06/2022    1:46 PM  CBC  WBC 4.0 - 10.5 K/uL 41.4  70.0  70.8   Hemoglobin 12.0 - 15.0 g/dL 9.9  10.4  9.5   Hematocrit 36.0 - 46.0 % 31.2  32.4  29.3   Platelets 150 - 400 K/uL 247  322  218        Latest Ref Rng & Units 08/06/2022    1:36 PM 07/08/2022   10:41 AM 06/06/2022    1:46 PM  CMP  Glucose 70 - 99 mg/dL 89  98  116   BUN 8 - 23 mg/dL _0 Creatinine 0.44 - 1.00 mg/dL 0.75  0.98  1.04   Sodium 135 - 145 mmol/L 135  134  122   Potassium 3.5 - 5.1 mmol/L 4.8  4.7  4.6   Chloride 98 - 111 mmol/L 104  104  92   CO2 22 - 32 mmol/L _1 Calcium 8.9 - 10.3 mg/dL 9.7  9.5  8.5   Total Protein 6.5 - 8.1 g/dL 5.8  6.9  6.1   Total Bilirubin 0.3 - 1.2 mg/dL 0.3  0.4  0.4   Alkaline Phos 38 - 126 U/L 69  80  64   AST 15 - 41 U/L _2 ALT 0 - 44 U/L _3 RADIOGRAPHIC STUDIES: No results found.  ASSESSMENT & PLAN [name] [name] 76 y.o. 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  History Adapted from Dr. Virgie Dad last note:   (1) status post right breast lumpectomy and axillary lymph node dissection in 1997 for a stage I breast cancer, treated with adjuvant radiation and tamoxifen for 5 years   (2) status post  right breast lower outer quadrant biopsy 07/13/2013 for a clinical T1c N0, stage IA invasive ductal carcinoma, grade 2, estrogen receptor 100% positive, progesterone receptor 13% positive, with an MIB-1 of 33%, and HER-2 amplification by CISH with a HER2/CEP 17 ratio of 3.19, and an average HER-2 copy number per cell of 4.15   (3) status post right mastectomy 07/28/2013 for a pT1c pN0, stage IA invasive ductal carcinoma, grade 3, with close but negative margins. Prognostic panel was not repeated             (a) the patient met with Dr. Harlow Mares and has decided against reconstruction   (4) completed weekly paclitaxel x12 12/06/2913, with trastuzumab/ pertuzumab every 3 weeks; pertuzumab was held with third dose on 11/01/2013 due to diarrhea, tried a half dose on cycle 4 again with diarrhea developing   (5) trastuzumab (started 09/20/2013) continued for 1 year, last dose 09/21/2014;             (a) final echocardiogram 09/07/2014 showed an ejection fraction of 55-60%   (6) anastrozole started May 2015; completed April 2020              (a) bone density 12/15/2013 normal             (b) bone density 02/01/2016 at [facility] was normal with a T score of -1.0     OTHER PROBLEMS: (a) History of chronic lymphoid leukemia diagnosed by flow cytometry 06/29/2013, the cells being CD5, CD20 and CD23 positive, CD10 negative.              (1) right renal mass resected on 02/05/15, consisting of atypical lymphoid proliferation composed of monotonous small lymphocytes              (2) Anemia with a normal MCV and normal ferritin-- B-12 and folate normal; stable             (3)  ibrutinib 280 milligrams daily started 03/26/2019, discontinued 06/03/2019 because of concerns regarding dosing             (4) rituximab weekly x8 started 06/15/2019 completed December 2020             (5) maintenance rituximab (Q 2 months) started 10/25/2019, continued through 08/03/2020  (6) 04/29/2022: Started acalabrutinib 100 mg PO twice daily   # CLL Rai Stage I --Due to rapid doubling time of white blood cell count from 28.3 on 10/07/2021 to 66.7 on 04/09/2022, recommendation was to initiate therapy.  Discussed options with patient she was agreeable to pursuing acalabrutinib 100 mg twice daily, started on 04/29/2022 --Last imaging of the abdomen in 2022 showed no evidence of splenomegaly Plan:  --Labs today show WBC 41.4. There is mild anemia with Hgb 9.9.  Creatinine and LFTs normal.  --Recommend to continue acalibrutinib 100 mg BID with no dose modifications.  --RTC in 4 weeks with labs.   #Head throbbing: --Patient reports throbbing sensation without headaches. Likely secondary to medication.  --Advised that headaches respond well to caffeine and trying to drink a caffeinated beverage. --Monitor for now and advised to follow up if symptoms worsen.  # ER + Breast Cancer in Survivorship -- Continue yearly mammograms. --last mammogram on 05/30/2021.  --Currently on survivorship.  No orders of the defined types were placed in this encounter.  All  questions were answered. The patient knows to call the clinic with any problems, questions or concerns.  I have spent a total of 30 minutes minutes of face-to-face and non-face-to-face  time, preparing to see the patient, performing a medically appropriate examination, counseling and educating the patient, ordering tests/procedures, documenting clinical information in the electronic health record, and care coordination.   Ledell Peoples, MD Department of Hematology/Oncology [place] [facility] Phone: [phone] Pager: [phone] Email: [name]  08/18/2022 4:39 PM

## 2022-08-21 ENCOUNTER — Other Ambulatory Visit: Payer: Self-pay

## 2022-08-22 ENCOUNTER — Other Ambulatory Visit (HOSPITAL_COMMUNITY): Payer: Self-pay

## 2022-08-27 ENCOUNTER — Encounter: Payer: Self-pay | Admitting: Oncology

## 2022-09-02 ENCOUNTER — Inpatient Hospital Stay (HOSPITAL_COMMUNITY)
Admission: EM | Admit: 2022-09-02 | Discharge: 2022-09-06 | DRG: 193 | Disposition: A | Discharge status: Home / Self Care | Payer: Medicare Other | Attending: Internal Medicine | Admitting: Internal Medicine

## 2022-09-02 DIAGNOSIS — Z833 Family history of diabetes mellitus: Secondary | ICD-10-CM | POA: Diagnosis not present

## 2022-09-02 DIAGNOSIS — Z8262 Family history of osteoporosis: Secondary | ICD-10-CM

## 2022-09-02 DIAGNOSIS — E860 Dehydration: Secondary | ICD-10-CM | POA: Diagnosis present

## 2022-09-02 DIAGNOSIS — Z808 Family history of malignant neoplasm of other organs or systems: Secondary | ICD-10-CM

## 2022-09-02 DIAGNOSIS — J210 Acute bronchiolitis due to respiratory syncytial virus: Secondary | ICD-10-CM | POA: Diagnosis present

## 2022-09-02 DIAGNOSIS — C9110 Chronic lymphocytic leukemia of B-cell type not having achieved remission: Secondary | ICD-10-CM | POA: Diagnosis present

## 2022-09-02 DIAGNOSIS — M797 Fibromyalgia: Secondary | ICD-10-CM | POA: Diagnosis not present

## 2022-09-02 DIAGNOSIS — G9341 Metabolic encephalopathy: Secondary | ICD-10-CM | POA: Diagnosis not present

## 2022-09-02 DIAGNOSIS — R059 Cough, unspecified: Secondary | ICD-10-CM | POA: Diagnosis not present

## 2022-09-02 DIAGNOSIS — E785 Hyperlipidemia, unspecified: Secondary | ICD-10-CM | POA: Diagnosis not present

## 2022-09-02 DIAGNOSIS — D630 Anemia in neoplastic disease: Secondary | ICD-10-CM | POA: Diagnosis not present

## 2022-09-02 DIAGNOSIS — Z981 Arthrodesis status: Secondary | ICD-10-CM

## 2022-09-02 DIAGNOSIS — E039 Hypothyroidism, unspecified: Secondary | ICD-10-CM | POA: Diagnosis present

## 2022-09-02 DIAGNOSIS — K219 Gastro-esophageal reflux disease without esophagitis: Secondary | ICD-10-CM | POA: Diagnosis not present

## 2022-09-02 DIAGNOSIS — B338 Other specified viral diseases: Secondary | ICD-10-CM | POA: Diagnosis not present

## 2022-09-02 DIAGNOSIS — Z85828 Personal history of other malignant neoplasm of skin: Secondary | ICD-10-CM | POA: Diagnosis not present

## 2022-09-02 DIAGNOSIS — R410 Disorientation, unspecified: Secondary | ICD-10-CM | POA: Diagnosis not present

## 2022-09-02 DIAGNOSIS — E222 Syndrome of inappropriate secretion of antidiuretic hormone: Secondary | ICD-10-CM | POA: Diagnosis not present

## 2022-09-02 DIAGNOSIS — J189 Pneumonia, unspecified organism: Principal | ICD-10-CM | POA: Diagnosis present

## 2022-09-02 DIAGNOSIS — G9340 Encephalopathy, unspecified: Secondary | ICD-10-CM | POA: Diagnosis present

## 2022-09-02 DIAGNOSIS — F05 Delirium due to known physiological condition: Secondary | ICD-10-CM | POA: Diagnosis not present

## 2022-09-02 DIAGNOSIS — F32A Depression, unspecified: Secondary | ICD-10-CM | POA: Diagnosis not present

## 2022-09-02 DIAGNOSIS — Z1152 Encounter for screening for COVID-19: Secondary | ICD-10-CM

## 2022-09-02 DIAGNOSIS — Z905 Acquired absence of kidney: Secondary | ICD-10-CM

## 2022-09-02 DIAGNOSIS — Z7982 Long term (current) use of aspirin: Secondary | ICD-10-CM

## 2022-09-02 DIAGNOSIS — Z8249 Family history of ischemic heart disease and other diseases of the circulatory system: Secondary | ICD-10-CM

## 2022-09-02 DIAGNOSIS — Z800 Family history of malignant neoplasm of digestive organs: Secondary | ICD-10-CM

## 2022-09-02 DIAGNOSIS — Z853 Personal history of malignant neoplasm of breast: Secondary | ICD-10-CM

## 2022-09-02 DIAGNOSIS — J90 Pleural effusion, not elsewhere classified: Secondary | ICD-10-CM | POA: Diagnosis not present

## 2022-09-02 DIAGNOSIS — Z79890 Hormone replacement therapy (postmenopausal): Secondary | ICD-10-CM

## 2022-09-02 DIAGNOSIS — Z8042 Family history of malignant neoplasm of prostate: Secondary | ICD-10-CM | POA: Diagnosis not present

## 2022-09-02 DIAGNOSIS — E871 Hypo-osmolality and hyponatremia: Secondary | ICD-10-CM | POA: Diagnosis not present

## 2022-09-02 DIAGNOSIS — Z87442 Personal history of urinary calculi: Secondary | ICD-10-CM

## 2022-09-02 DIAGNOSIS — Z9221 Personal history of antineoplastic chemotherapy: Secondary | ICD-10-CM | POA: Diagnosis not present

## 2022-09-02 DIAGNOSIS — L659 Nonscarring hair loss, unspecified: Secondary | ICD-10-CM | POA: Diagnosis present

## 2022-09-02 DIAGNOSIS — Z9109 Other allergy status, other than to drugs and biological substances: Secondary | ICD-10-CM

## 2022-09-02 DIAGNOSIS — R0602 Shortness of breath: Secondary | ICD-10-CM | POA: Diagnosis not present

## 2022-09-02 DIAGNOSIS — Z8701 Personal history of pneumonia (recurrent): Secondary | ICD-10-CM | POA: Diagnosis not present

## 2022-09-02 DIAGNOSIS — H9191 Unspecified hearing loss, right ear: Secondary | ICD-10-CM | POA: Diagnosis present

## 2022-09-02 DIAGNOSIS — Z823 Family history of stroke: Secondary | ICD-10-CM | POA: Diagnosis not present

## 2022-09-02 DIAGNOSIS — Z9011 Acquired absence of right breast and nipple: Secondary | ICD-10-CM

## 2022-09-02 DIAGNOSIS — Z888 Allergy status to other drugs, medicaments and biological substances status: Secondary | ICD-10-CM

## 2022-09-02 DIAGNOSIS — J069 Acute upper respiratory infection, unspecified: Secondary | ICD-10-CM | POA: Diagnosis not present

## 2022-09-02 DIAGNOSIS — Z882 Allergy status to sulfonamides status: Secondary | ICD-10-CM

## 2022-09-02 DIAGNOSIS — Z79899 Other long term (current) drug therapy: Secondary | ICD-10-CM

## 2022-09-02 DIAGNOSIS — J205 Acute bronchitis due to respiratory syncytial virus: Secondary | ICD-10-CM | POA: Diagnosis present

## 2022-09-02 DIAGNOSIS — F419 Anxiety disorder, unspecified: Secondary | ICD-10-CM | POA: Diagnosis present

## 2022-09-03 ENCOUNTER — Encounter (HOSPITAL_COMMUNITY): Payer: Self-pay

## 2022-09-03 ENCOUNTER — Ambulatory Visit: Payer: Medicare Other | Admitting: Pulmonary Disease

## 2022-09-03 ENCOUNTER — Emergency Department (HOSPITAL_COMMUNITY): Payer: Medicare Other

## 2022-09-03 ENCOUNTER — Encounter: Payer: Self-pay | Admitting: Oncology

## 2022-09-03 ENCOUNTER — Other Ambulatory Visit: Payer: Self-pay

## 2022-09-03 DIAGNOSIS — J210 Acute bronchiolitis due to respiratory syncytial virus: Secondary | ICD-10-CM | POA: Diagnosis present

## 2022-09-03 DIAGNOSIS — J189 Pneumonia, unspecified organism: Secondary | ICD-10-CM | POA: Diagnosis present

## 2022-09-03 DIAGNOSIS — M797 Fibromyalgia: Secondary | ICD-10-CM | POA: Diagnosis present

## 2022-09-03 DIAGNOSIS — Z823 Family history of stroke: Secondary | ICD-10-CM | POA: Diagnosis not present

## 2022-09-03 DIAGNOSIS — B338 Other specified viral diseases: Secondary | ICD-10-CM | POA: Diagnosis present

## 2022-09-03 DIAGNOSIS — Z85828 Personal history of other malignant neoplasm of skin: Secondary | ICD-10-CM | POA: Diagnosis not present

## 2022-09-03 DIAGNOSIS — G9341 Metabolic encephalopathy: Secondary | ICD-10-CM | POA: Diagnosis present

## 2022-09-03 DIAGNOSIS — E871 Hypo-osmolality and hyponatremia: Secondary | ICD-10-CM

## 2022-09-03 DIAGNOSIS — K219 Gastro-esophageal reflux disease without esophagitis: Secondary | ICD-10-CM | POA: Diagnosis present

## 2022-09-03 DIAGNOSIS — Z833 Family history of diabetes mellitus: Secondary | ICD-10-CM | POA: Diagnosis not present

## 2022-09-03 DIAGNOSIS — H9191 Unspecified hearing loss, right ear: Secondary | ICD-10-CM | POA: Diagnosis present

## 2022-09-03 DIAGNOSIS — Z8262 Family history of osteoporosis: Secondary | ICD-10-CM | POA: Diagnosis not present

## 2022-09-03 DIAGNOSIS — D630 Anemia in neoplastic disease: Secondary | ICD-10-CM | POA: Diagnosis present

## 2022-09-03 DIAGNOSIS — E860 Dehydration: Secondary | ICD-10-CM | POA: Diagnosis present

## 2022-09-03 DIAGNOSIS — Z800 Family history of malignant neoplasm of digestive organs: Secondary | ICD-10-CM | POA: Diagnosis not present

## 2022-09-03 DIAGNOSIS — Z1152 Encounter for screening for COVID-19: Secondary | ICD-10-CM | POA: Diagnosis not present

## 2022-09-03 DIAGNOSIS — C9110 Chronic lymphocytic leukemia of B-cell type not having achieved remission: Secondary | ICD-10-CM | POA: Diagnosis present

## 2022-09-03 DIAGNOSIS — G9340 Encephalopathy, unspecified: Secondary | ICD-10-CM | POA: Diagnosis not present

## 2022-09-03 DIAGNOSIS — F32A Depression, unspecified: Secondary | ICD-10-CM | POA: Diagnosis present

## 2022-09-03 DIAGNOSIS — Z8042 Family history of malignant neoplasm of prostate: Secondary | ICD-10-CM | POA: Diagnosis not present

## 2022-09-03 DIAGNOSIS — Z808 Family history of malignant neoplasm of other organs or systems: Secondary | ICD-10-CM | POA: Diagnosis not present

## 2022-09-03 DIAGNOSIS — Z8249 Family history of ischemic heart disease and other diseases of the circulatory system: Secondary | ICD-10-CM | POA: Diagnosis not present

## 2022-09-03 DIAGNOSIS — F05 Delirium due to known physiological condition: Secondary | ICD-10-CM | POA: Diagnosis present

## 2022-09-03 DIAGNOSIS — E039 Hypothyroidism, unspecified: Secondary | ICD-10-CM | POA: Diagnosis present

## 2022-09-03 DIAGNOSIS — E222 Syndrome of inappropriate secretion of antidiuretic hormone: Secondary | ICD-10-CM | POA: Diagnosis present

## 2022-09-03 DIAGNOSIS — Z9221 Personal history of antineoplastic chemotherapy: Secondary | ICD-10-CM | POA: Diagnosis not present

## 2022-09-03 DIAGNOSIS — E785 Hyperlipidemia, unspecified: Secondary | ICD-10-CM | POA: Diagnosis present

## 2022-09-03 LAB — BASIC METABOLIC PANEL
Anion gap: 12 (ref 5–15)
Anion gap: 10 (ref 5–15)
Anion gap: 9 (ref 5–15)
BUN: 22 mg/dL (ref 8–23)
BUN: 16 mg/dL (ref 8–23)
BUN: 13 mg/dL (ref 8–23)
CO2: 19 mmol/L — ABNORMAL LOW (ref 22–32)
CO2: 18 mmol/L — ABNORMAL LOW (ref 22–32)
CO2: 17 mmol/L — ABNORMAL LOW (ref 22–32)
Calcium: 8.8 mg/dL — ABNORMAL LOW (ref 8.9–10.3)
Calcium: 8.5 mg/dL — ABNORMAL LOW (ref 8.9–10.3)
Calcium: 8.4 mg/dL — ABNORMAL LOW (ref 8.9–10.3)
Chloride: 90 mmol/L — ABNORMAL LOW (ref 98–111)
Chloride: 100 mmol/L (ref 98–111)
Chloride: 100 mmol/L (ref 98–111)
Creatinine, Ser: 0.92 mg/dL (ref 0.44–1.00)
Creatinine, Ser: 0.84 mg/dL (ref 0.44–1.00)
Creatinine, Ser: 0.66 mg/dL (ref 0.44–1.00)
GFR, Estimated: >60 mL/min (ref >60)
GFR, Estimated: >60 mL/min (ref >60)
GFR, Estimated: >60 mL/min (ref >60)
Glucose, Bld: 113 mg/dL — ABNORMAL HIGH (ref 70–99)
Glucose, Bld: 155 mg/dL — ABNORMAL HIGH (ref 70–99)
Glucose, Bld: 110 mg/dL — ABNORMAL HIGH (ref 70–99)
Potassium: 3.9 mmol/L (ref 3.5–5.1)
Potassium: 3.5 mmol/L (ref 3.5–5.1)
Potassium: 3.9 mmol/L (ref 3.5–5.1)
Sodium: 121 mmol/L — ABNORMAL LOW (ref 135–145)
Sodium: 128 mmol/L — ABNORMAL LOW (ref 135–145)
Sodium: 126 mmol/L — ABNORMAL LOW (ref 135–145)

## 2022-09-03 LAB — CBC WITH DIFFERENTIAL/PLATELET
Abs Immature Granulocytes: 0.65 10*3/uL — ABNORMAL HIGH (ref 0.00–0.07)
Basophils Absolute: 0.1 10*3/uL (ref 0.0–0.1)
Basophils Relative: 0 %
Eosinophils Absolute: 0.1 10*3/uL (ref 0.0–0.5)
Eosinophils Relative: 0 %
HCT: 27.8 % — ABNORMAL LOW (ref 36.0–46.0)
Hemoglobin: 8.9 g/dL — ABNORMAL LOW (ref 12.0–15.0)
Immature Granulocytes: 1 %
Lymphocytes Relative: 68 %
Lymphs Abs: 40.5 10*3/uL — ABNORMAL HIGH (ref 0.7–4.0)
MCH: 30.4 pg (ref 26.0–34.0)
MCHC: 32 g/dL (ref 30.0–36.0)
MCV: 94.9 fL (ref 80.0–100.0)
Monocytes Absolute: 2.5 10*3/uL — ABNORMAL HIGH (ref 0.1–1.0)
Monocytes Relative: 4 %
Neutro Abs: 16.5 10*3/uL — ABNORMAL HIGH (ref 1.7–7.7)
Neutrophils Relative %: 27 %
Platelets: 264 10*3/uL (ref 150–400)
RBC: 2.93 MIL/uL — ABNORMAL LOW (ref 3.87–5.11)
RDW: 13.2 % (ref 11.5–15.5)
WBC: 60.3 10*3/uL (ref 4.0–10.5)
nRBC: 0 % (ref 0.0–0.2)

## 2022-09-03 LAB — URINALYSIS, ROUTINE W REFLEX MICROSCOPIC
Bilirubin Urine: NEGATIVE
Glucose, UA: NEGATIVE mg/dL
Hgb urine dipstick: NEGATIVE
Ketones, ur: NEGATIVE mg/dL
Leukocytes,Ua: NEGATIVE
Nitrite: NEGATIVE
Protein, ur: NEGATIVE mg/dL
Specific Gravity, Urine: 1.014 (ref 1.005–1.030)
pH: 5 (ref 5.0–8.0)

## 2022-09-03 LAB — BASIC METABOLIC PANEL WITH GFR
Anion gap: 11 (ref 5–15)
BUN: 14 mg/dL (ref 8–23)
CO2: 19 mmol/L — ABNORMAL LOW (ref 22–32)
Calcium: 8.7 mg/dL — ABNORMAL LOW (ref 8.9–10.3)
Chloride: 98 mmol/L (ref 98–111)
Creatinine, Ser: 0.77 mg/dL (ref 0.44–1.00)
GFR, Estimated: >60 mL/min
Glucose, Bld: 117 mg/dL — ABNORMAL HIGH (ref 70–99)
Potassium: 3.7 mmol/L (ref 3.5–5.1)
Sodium: 128 mmol/L — ABNORMAL LOW (ref 135–145)

## 2022-09-03 LAB — CBC
HCT: 25.8 % — ABNORMAL LOW (ref 36.0–46.0)
Hemoglobin: 8.1 g/dL — ABNORMAL LOW (ref 12.0–15.0)
MCH: 29.9 pg (ref 26.0–34.0)
MCHC: 31.4 g/dL (ref 30.0–36.0)
MCV: 95.2 fL (ref 80.0–100.0)
Platelets: 243 10*3/uL (ref 150–400)
RBC: 2.71 MIL/uL — ABNORMAL LOW (ref 3.87–5.11)
RDW: 13.2 % (ref 11.5–15.5)
WBC: 53.3 10*3/uL (ref 4.0–10.5)
nRBC: 0 % (ref 0.0–0.2)

## 2022-09-03 LAB — RESP PANEL BY RT-PCR (RSV, FLU A&B, COVID)  RVPGX2
Influenza A by PCR: NEGATIVE
Influenza B by PCR: NEGATIVE
Resp Syncytial Virus by PCR: POSITIVE — AB
SARS Coronavirus 2 by RT PCR: NEGATIVE

## 2022-09-03 LAB — STREP PNEUMONIAE URINARY ANTIGEN: Strep Pneumo Urinary Antigen: NEGATIVE

## 2022-09-03 LAB — LACTIC ACID, PLASMA: Lactic Acid, Venous: 0.9 mmol/L (ref 0.5–1.9)

## 2022-09-03 LAB — SODIUM, URINE, RANDOM: Sodium, Ur: 26 mmol/L

## 2022-09-03 MED ORDER — SODIUM CHLORIDE 0.9 % IV SOLN: Freq: Once | INTRAVENOUS | Status: AC

## 2022-09-03 MED ORDER — VITAMIN B-12 1000 MCG PO TABS
1000.0000 ug | ORAL_TABLET | Freq: Every day | ORAL | Status: DC
  Administered 2022-09-03 – 2022-09-06 (×4): 1000 ug via ORAL
  Filled 2022-09-03 (×4): qty 1

## 2022-09-03 MED ORDER — BENZONATATE 100 MG PO CAPS
200.0000 mg | ORAL_CAPSULE | Freq: Three times a day (TID) | ORAL | Status: DC
  Administered 2022-09-03 – 2022-09-06 (×10): 200 mg via ORAL
  Filled 2022-09-03 (×10): qty 2

## 2022-09-03 MED ORDER — AMITRIPTYLINE HCL 50 MG PO TABS: 100.0000 mg | ORAL_TABLET | Freq: Every day | ORAL | Status: DC

## 2022-09-03 MED ORDER — GUAIFENESIN ER 600 MG PO TB12
600.0000 mg | ORAL_TABLET | Freq: Two times a day (BID) | ORAL | Status: DC | PRN
  Administered 2022-09-06: 600 mg via ORAL
  Filled 2022-09-03: qty 1

## 2022-09-03 MED ORDER — SODIUM CHLORIDE 0.9 % IV SOLN
2.0000 g | INTRAVENOUS | Status: DC
  Administered 2022-09-04 – 2022-09-06 (×3): 2 g via INTRAVENOUS
  Filled 2022-09-03 (×3): qty 20

## 2022-09-03 MED ORDER — SODIUM CHLORIDE 0.9 % IV SOLN: INTRAVENOUS | Status: DC

## 2022-09-03 MED ORDER — DULOXETINE HCL 60 MG PO CPEP: 60.0000 mg | ORAL_CAPSULE | Freq: Two times a day (BID) | ORAL | Status: DC

## 2022-09-03 MED ORDER — PHENOL 1.4 % MT LIQD
1.0000 | OROMUCOSAL | Status: DC | PRN
  Administered 2022-09-03 – 2022-09-06 (×2): 1 via OROMUCOSAL
  Filled 2022-09-03: qty 177

## 2022-09-03 MED ORDER — PANTOPRAZOLE SODIUM 40 MG PO TBEC
40.0000 mg | DELAYED_RELEASE_TABLET | Freq: Every day | ORAL | Status: DC
  Administered 2022-09-03 – 2022-09-06 (×4): 40 mg via ORAL
  Filled 2022-09-03 (×4): qty 1

## 2022-09-03 MED ORDER — FERROUS SULFATE 325 (65 FE) MG PO TABS
325.0000 mg | ORAL_TABLET | Freq: Every day | ORAL | Status: DC
  Administered 2022-09-04 – 2022-09-06 (×3): 325 mg via ORAL
  Filled 2022-09-03 (×3): qty 1

## 2022-09-03 MED ORDER — ENOXAPARIN SODIUM 40 MG/0.4ML IJ SOSY
40.0000 mg | PREFILLED_SYRINGE | INTRAMUSCULAR | Status: DC
  Administered 2022-09-03 – 2022-09-06 (×4): 40 mg via SUBCUTANEOUS
  Filled 2022-09-03 (×4): qty 0.4

## 2022-09-03 MED ORDER — GABAPENTIN 100 MG PO CAPS
100.0000 mg | ORAL_CAPSULE | Freq: Every day | ORAL | Status: DC
  Administered 2022-09-03 – 2022-09-05 (×3): 100 mg via ORAL
  Filled 2022-09-03 (×3): qty 1

## 2022-09-03 MED ORDER — BUPROPION HCL ER (XL) 300 MG PO TB24
300.0000 mg | ORAL_TABLET | Freq: Every day | ORAL | Status: DC
  Administered 2022-09-03 – 2022-09-06 (×4): 300 mg via ORAL
  Filled 2022-09-03 (×4): qty 1

## 2022-09-03 MED ORDER — ORAL CARE MOUTH RINSE: 15.0000 mL | OROMUCOSAL | Status: DC | PRN

## 2022-09-03 MED ORDER — SODIUM CHLORIDE 0.9 % IV SOLN
500.0000 mg | INTRAVENOUS | Status: DC
  Administered 2022-09-04 – 2022-09-06 (×3): 500 mg via INTRAVENOUS
  Filled 2022-09-03 (×3): qty 5

## 2022-09-03 MED ORDER — BREXPIPRAZOLE 1 MG PO TABS
1.0000 mg | ORAL_TABLET | Freq: Every day | ORAL | Status: DC
  Administered 2022-09-03 – 2022-09-06 (×4): 1 mg via ORAL
  Filled 2022-09-03 (×4): qty 1

## 2022-09-03 MED ORDER — SODIUM CHLORIDE 0.9 % IV SOLN
500.0000 mg | Freq: Once | INTRAVENOUS | Status: AC
  Administered 2022-09-03: 500 mg via INTRAVENOUS
  Filled 2022-09-03: qty 5

## 2022-09-03 MED ORDER — LEVOTHYROXINE SODIUM 88 MCG PO TABS
88.0000 ug | ORAL_TABLET | Freq: Every day | ORAL | Status: DC
  Administered 2022-09-03 – 2022-09-06 (×4): 88 ug via ORAL
  Filled 2022-09-03 (×4): qty 1

## 2022-09-03 MED ORDER — TOPIRAMATE 25 MG PO TABS
25.0000 mg | ORAL_TABLET | Freq: Every evening | ORAL | Status: DC
  Administered 2022-09-03 – 2022-09-04 (×2): 25 mg via ORAL
  Filled 2022-09-03 (×2): qty 1

## 2022-09-03 MED ORDER — SODIUM CHLORIDE 0.9 % IV SOLN
1.0000 g | Freq: Once | INTRAVENOUS | Status: AC
  Administered 2022-09-03: 1 g via INTRAVENOUS
  Filled 2022-09-03: qty 10

## 2022-09-03 NOTE — ED Notes (Signed)
ED TO INPATIENT HANDOFF REPORT  ED Nurse Name and Phone #: Baxter Flattery, RN  S Name/Age/Gender [name] [name] 77 y.o. female Room/Bed: WA20/WA20  Code Status   Code Status: Full Code  Home/SNF/Other Home Patient oriented to: self, place, time, and situation Is this baseline? Yes   Triage Complete: Triage complete  Chief Complaint CAP (community acquired pneumonia) [J18.9]  Triage Note Pt reports with cough x 1 week and SHOB that got worsened today. Pt reports seeing her pcp today and had a chest x ray.    Allergies Allergies  Allergen Reactions   Lasix [Furosemide] Nausea And Vomiting and Other (See Comments)    HEADACHE   Lithium Other (See Comments)    Dizziness, "cause me to fall"   Sulfa Antibiotics Hives   Trazodone And Nefazodone Other (See Comments)    insomnia   Lyrica [Pregabalin] Diarrhea, Nausea Only and Rash    Level of Care/Admitting Diagnosis ED Disposition     ED Disposition  Admit   Condition  --   Comment  Hospital Area: [facility] [[id number]]  Level of Care: Med-Surg [16]  May place patient in observation at [facility] or [place] if equivalent level of care is available:: No  Covid Evaluation: Confirmed COVID Negative  Diagnosis: CAP (community acquired pneumonia) [166063]  Admitting Physician: Etta Quill [phone]  Attending Physician: Etta Quill [[id number]]          B Medical/Surgery History Past Medical History:  Diagnosis Date   Alopecia    Anxiety    Arthritis    "spine" (07/28/2013)   Breast cancer ([place]) 1997; 2014   right   CAP (community acquired pneumonia)    admission 06-04-2013, failed outpatient   Chronic back pain    "lower back and upper neck" (07/28/2013)   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   DDD (degenerative disc disease)    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 ear    Depression    Diverticulosis    Elevated LFTs    Fibromyalgia 1978   GERD (gastroesophageal reflux disease)    Hematuria     History of syncope    episode 2008--  no recurrence since   HLD (hyperlipidemia)    Hypothyroidism    Kidney stones 2014   Plantar fasciitis    Ruptured disk    one in neck and two in back   S/P chemotherapy, time since greater than 12 weeks    Sinus tachycardia    mild resting   Skin cancer    nose   Stool incontinence    "at times recently"    Past Surgical History:  Procedure Laterality Date   ANTERIOR LATERAL LUMBAR FUSION 4 LEVELS Left 04/25/2016   Procedure: LEFT LUMBAR ONE-TWO, LUMBAR TWO-THREE, LUMBAR THREE-FOUR, LUMBAR FOUR-FIVE ANTERIOR LATERAL LUMBAR FUSION;  Surgeon: Earnie Larsson, MD;  Location: [facility];  Service: Neurosurgery;  Laterality: Left;   APPENDECTOMY  1093   APPLICATION OF ROBOTIC ASSISTANCE FOR SPINAL PROCEDURE N/A 04/06/2017   Procedure: APPLICATION OF ROBOTIC ASSISTANCE FOR SPINAL PROCEDURE;  Surgeon: Earnie Larsson, MD;  Location: [facility];  Service: Neurosurgery;  Laterality: N/A;   BREAST BIOPSY Right 2014   BREAST LUMPECTOMY Right 1997   BREAST LUMPECTOMY WITH AXILLARY LYMPH NODE DISSECTION Right 1997   CATARACT EXTRACTION, BILATERAL  2019   Dr. Kathlen Mody   CYSTOSCOPY WITH RETROGRADE PYELOGRAM, URETEROSCOPY AND STENT PLACEMENT Bilateral 06/17/2013   Procedure: CYSTOSCOPY WITH RETROGRADE PYELOGRAM, URETEROSCOPY AND LEFT DOUBLE  J STENT  PLACEMENT RIGHT URETERAL HOLMIIUM LASER AND DOUBLE J STENT ;  Surgeon: Hanley Ben, MD;  Location: [facility];  Service: Urology;  Laterality: Bilateral;   HOLMIUM LASER APPLICATION Bilateral 03/00/9233   Procedure: HOLMIUM LASER APPLICATION;  Surgeon: Hanley Ben, MD;  Location: [place] [place];  Service: Urology;  Laterality: Bilateral;   LITHOTRIPSY Left 06/2013   LUMBAR EPIDURAL INJECTION     has had 7 injections   PORT-A-CATH REMOVAL Left 10/03/2014   Procedure: REMOVAL PORT-A-CATH;  Surgeon: Donnie Mesa, MD;  Location: [facility];  Service: General;  Laterality: Left;    PORTACATH PLACEMENT Left 07/28/2013   Procedure: ATTEMPTED INSERTION PORT-A-CATH;  Surgeon: Imogene Burn. Georgette Dover, MD;  Location: [facility];  Service: General;  Laterality: Left;   POSTERIOR LUMBAR FUSION 4 LEVEL N/A 04/25/2016   Procedure: LUMBAR FIVE-SACRAL ONE POSTERIOR LUMBAR INTERBODY FUSION, THORACIC NINE-SACRAL ONE POSTERIOR LATERAL ARTHRODESIS WITH PEDICLE SCREWS;  Surgeon: Earnie Larsson, MD;  Location: [facility];  Service: Neurosurgery;  Laterality: N/A;   RETINAL DETACHMENT SURGERY Right 2013   ROBOTIC ASSITED PARTIAL NEPHRECTOMY Right 02/05/2015   Procedure: ROBOTIC ASSITED PARTIAL NEPHRECTOMY;  Surgeon: Raynelle Bring, MD;  Location: [facility];  Service: Urology;  Laterality: Right;   SIMPLE MASTECTOMY WITH AXILLARY SENTINEL NODE BIOPSY Right 07/28/2013   Procedure: RIGHT TOTAL  MASTECTOMY;  Surgeon: Imogene Burn. Georgette Dover, MD;  Location: [place] [facility];  Service: General;  Laterality: Right;   TONSILLECTOMY  AGE 56   TOTAL ABDOMINAL HYSTERECTOMY W/ BILATERAL SALPINGOOPHORECTOMY  1997     A IV Location/Drains/Wounds Patient Lines/Drains/Airways Status     Active Line/Drains/Airways     Name Placement date Placement time Site Days   Peripheral IV 09/03/22 20 G Left Antecubital 09/03/22  0050  Antecubital  less than 1   Ureteral Drain/Stent Right ureter 6 Fr. 06/17/13  1413  Right ureter  3365   Ureteral Drain/Stent Left ureter 6 Fr. 06/17/13  1417  Left ureter  3365   Incision (Closed) 04/25/16 Back Left 04/25/16  1336  -- 2322   Incision (Closed) 04/25/16 Back Other (Comment) 04/25/16  1519  -- 2322   Incision (Closed) 04/06/17 Back 04/06/17  1039  -- 1976   Incision - 5 Ports Abdomen 1: Superior;Umbilicus 2: Superior;Lateral;Right 3: Right;Umbilicus;Lateral 4: Right;Mid;Lower 5: Lateral;Lower 02/05/15  1134  -- 2767            Intake/Output Last 24 hours  Intake/Output Summary (Last 24 hours) at 09/03/2022 0748 Last data filed at 09/03/2022 0076 Gross per 24 hour  Intake 683.13 ml  Output --  Net  683.13 ml    Labs/Imaging Results for orders placed or performed during the hospital encounter of 09/02/22 (from the past 48 hour(s))  Resp panel by RT-PCR (RSV, Flu A&B, Covid) Anterior Nasal Swab     Status: Abnormal   Collection Time: 09/03/22 12:18 AM   Specimen: Anterior Nasal Swab  Result Value Ref Range   SARS Coronavirus 2 by RT PCR NEGATIVE NEGATIVE    Comment: (NOTE) SARS-CoV-2 target nucleic acids are NOT DETECTED.  The SARS-CoV-2 RNA is generally detectable in upper respiratory specimens during the acute phase of infection. The lowest concentration of SARS-CoV-2 viral copies this assay can detect is 138 copies/mL. A negative result does not preclude SARS-Cov-2 infection and should not be used as the sole basis for treatment or other patient management decisions. A negative result may occur with  improper specimen collection/handling, submission of specimen other than nasopharyngeal swab, presence  of viral mutation(s) within the areas targeted by this assay, and inadequate number of viral copies(<138 copies/mL). A negative result must be combined with clinical observations, patient history, and epidemiological information. The expected result is Negative.  Fact Sheet for Patients:  EntrepreneurPulse.com.au  Fact Sheet for Healthcare Providers:  IncredibleEmployment.be  This test is no t yet approved or cleared by the [place] FDA and  has been authorized for detection and/or diagnosis of SARS-CoV-2 by FDA under an Emergency Use Authorization (EUA). This EUA will remain  in effect (meaning this test can be used) for the duration of the COVID-19 declaration under Section 564(b)(1) of the Act, 21 U.S.C.section 360bbb-3(b)(1), unless the authorization is terminated  or revoked sooner.       Influenza A by PCR NEGATIVE NEGATIVE   Influenza B by PCR NEGATIVE NEGATIVE    Comment: (NOTE) The Xpert Xpress SARS-CoV-2/FLU/RSV plus  assay is intended as an aid in the diagnosis of influenza from Nasopharyngeal swab specimens and should not be used as a sole basis for treatment. Nasal washings and aspirates are unacceptable for Xpert Xpress SARS-CoV-2/FLU/RSV testing.  Fact Sheet for Patients: EntrepreneurPulse.com.au  Fact Sheet for Healthcare Providers: IncredibleEmployment.be  This test is not yet approved or cleared by the [place] FDA and has been authorized for detection and/or diagnosis of SARS-CoV-2 by FDA under an Emergency Use Authorization (EUA). This EUA will remain in effect (meaning this test can be used) for the duration of the COVID-19 declaration under Section 564(b)(1) of the Act, 21 U.S.C. section 360bbb-3(b)(1), unless the authorization is terminated or revoked.     Resp Syncytial Virus by PCR POSITIVE (A) NEGATIVE    Comment: (NOTE) Fact Sheet for Patients: EntrepreneurPulse.com.au  Fact Sheet for Healthcare Providers: IncredibleEmployment.be  This test is not yet approved or cleared by the [place] FDA and has been authorized for detection and/or diagnosis of SARS-CoV-2 by FDA under an Emergency Use Authorization (EUA). This EUA will remain in effect (meaning this test can be used) for the duration of the COVID-19 declaration under Section 564(b)(1) of the Act, 21 U.S.C. section 360bbb-3(b)(1), unless the authorization is terminated or revoked.  Performed at [facility], [place] [address], [place], [place] [address]   CBC with Differential     Status: Abnormal   Collection Time: 09/03/22 12:58 AM  Result Value Ref Range   WBC 60.3 (HH) 4.0 - 10.5 K/uL    Comment: This critical result has verified and been called to [facility] RN by [facility] on 01 10 2024 at 0238, and has been read back.    RBC 2.93 (L) 3.87 - 5.11 MIL/uL   Hemoglobin 8.9 (L) 12.0 - 15.0 g/dL   HCT 27.8 (L) 36.0  - 46.0 %   MCV 94.9 80.0 - 100.0 fL   MCH 30.4 26.0 - 34.0 pg   MCHC 32.0 30.0 - 36.0 g/dL   RDW 13.2 11.5 - 15.5 %   Platelets 264 150 - 400 K/uL   nRBC 0.0 0.0 - 0.2 %   Neutrophils Relative % 27 %   Neutro Abs 16.5 (H) 1.7 - 7.7 K/uL   Lymphocytes Relative 68 %   Lymphs Abs 40.5 (H) 0.7 - 4.0 K/uL   Monocytes Relative 4 %   Monocytes Absolute 2.5 (H) 0.1 - 1.0 K/uL   Eosinophils Relative 0 %   Eosinophils Absolute 0.1 0.0 - 0.5 K/uL   Basophils Relative 0 %   Basophils Absolute 0.1 0.0 - 0.1 K/uL  Immature Granulocytes 1 %   Abs Immature Granulocytes 0.65 (H) 0.00 - 0.07 K/uL    Comment: Performed at [facility], [place] [address] [place], [place] [address]  Basic metabolic panel     Status: Abnormal   Collection Time: 09/03/22 12:58 AM  Result Value Ref Range   Sodium 121 (L) 135 - 145 mmol/L   Potassium 3.9 3.5 - 5.1 mmol/L   Chloride 90 (L) 98 - 111 mmol/L   CO2 19 (L) 22 - 32 mmol/L   Glucose, Bld 113 (H) 70 - 99 mg/dL    Comment: Glucose reference range applies only to samples taken after fasting for at least 8 hours.   BUN 22 8 - 23 mg/dL   Creatinine, Ser 0.92 0.44 - 1.00 mg/dL   Calcium 8.8 (L) 8.9 - 10.3 mg/dL   GFR, Estimated >60 >60 mL/min    Comment: (NOTE) Calculated using the CKD-EPI Creatinine Equation (2021)    Anion gap 12 5 - 15    Comment: Performed at [facility], [place] [address] [place], [place] [address]  Lactic acid, plasma     Status: None   Collection Time: 09/03/22 12:58 AM  Result Value Ref Range   Lactic Acid, Venous 0.9 0.5 - 1.9 mmol/L    Comment: Performed at [facility], [place] [address] [place], [place] [address]  Urinalysis, Routine w reflex microscopic Urine, In & Out Cath     Status: None   Collection Time: 09/03/22  1:41 AM  Result Value Ref Range   Color, Urine YELLOW YELLOW   APPearance CLEAR CLEAR   Specific Gravity, Urine 1.014 1.005 - 1.030   pH 5.0 5.0 - 8.0    Glucose, UA NEGATIVE NEGATIVE mg/dL   Hgb urine dipstick NEGATIVE NEGATIVE   Bilirubin Urine NEGATIVE NEGATIVE   Ketones, ur NEGATIVE NEGATIVE mg/dL   Protein, ur NEGATIVE NEGATIVE mg/dL   Nitrite NEGATIVE NEGATIVE   Leukocytes,Ua NEGATIVE NEGATIVE    Comment: Performed at [facility], [place] [address], [place], [place] [address]  CBC     Status: Abnormal   Collection Time: 09/03/22  5:30 AM  Result Value Ref Range   WBC 53.3 (HH) 4.0 - 10.5 K/uL    Comment: CRITICAL VALUE NOTED.  VALUE IS CONSISTENT WITH PREVIOUSLY REPORTED AND CALLED VALUE. REPEATED TO VERIFY    RBC 2.71 (L) 3.87 - 5.11 MIL/uL   Hemoglobin 8.1 (L) 12.0 - 15.0 g/dL   HCT 25.8 (L) 36.0 - 46.0 %   MCV 95.2 80.0 - 100.0 fL   MCH 29.9 26.0 - 34.0 pg   MCHC 31.4 30.0 - 36.0 g/dL   RDW 13.2 11.5 - 15.5 %   Platelets 243 150 - 400 K/uL   nRBC 0.0 0.0 - 0.2 %    Comment: Performed at [facility], [place] [address], [place], [place] [address]   *Note: Due to a large number of results and/or encounters for the requested time period, some results have not been displayed. A complete set of results can be found in Results Review.   DG Chest Port 1 View  Result Date: 09/03/2022 CLINICAL DATA:  Cough and shortness of breath EXAM: PORTABLE CHEST 1 VIEW COMPARISON:  10/10/2020 FINDINGS: Normal cardiomediastinal silhouette. Aortic atherosclerotic calcification. Hazy airspace and interstitial opacities in the right lower lung. Mild diffuse interstitial coarsening. Low lung volumes. Trace right pleural effusion. No pneumothorax. Thoracolumbar fusion hardware. Surgical clips right axilla. IMPRESSION: Right  lower lung pneumonia. Electronically Signed   By: Placido Sou M.D.   On: 09/03/2022 01:51    Pending Labs Unresulted Labs (From admission, onward)     Start     Ordered   09/10/22 0500  Creatinine, serum  (enoxaparin (LOVENOX)    CrCl >/= 30 ml/min)  Weekly,   R     Comments: while on  enoxaparin therapy    09/03/22 0511   09/03/22 2334  Basic metabolic panel  Now then every 4 hours,   R (with TIMED occurrences)      09/03/22 0517   09/03/22 0512  Strep pneumoniae urinary antigen  Once,   R        09/03/22 0511   09/03/22 0510  HIV Antibody (routine testing w rflx)  (HIV Antibody (Routine testing w reflex) panel)  Once,   R        09/03/22 0511   09/03/22 0041  Lactic acid, plasma  Now then every 2 hours,   R (with STAT occurrences)      09/03/22 0040   09/03/22 0039  Blood culture (routine x 2)  BLOOD CULTURE X 2,   R (with STAT occurrences)      09/03/22 0038            Vitals/Pain Today's Vitals   09/03/22 0345 09/03/22 0415 09/03/22 0651 09/03/22 0700  BP: 121/82 (!) 114/55  (!) 114/49  Pulse: 93 94  95  Resp: (!) 23 (!) 23  17  Temp:   98.2 F (36.8 C)   TempSrc:   Oral   SpO2: 100% 96%  95%  Weight:      Height:      PainSc:        Isolation Precautions Airborne and Contact precautions  Medications Medications  0.9 %  sodium chloride infusion ( Intravenous New Bag/Given 09/03/22 0532)  phenol (CHLORASEPTIC) mouth spray 1 spray (1 spray Mouth/Throat Given 09/03/22 0532)  enoxaparin (LOVENOX) injection 40 mg (has no administration in time range)  cefTRIAXone (ROCEPHIN) 2 g in sodium chloride 0.9 % 100 mL IVPB (has no administration in time range)  azithromycin (ZITHROMAX) 500 mg in sodium chloride 0.9 % 250 mL IVPB (has no administration in time range)  0.9 %  sodium chloride infusion (0 mLs Intravenous Stopped 09/03/22 0523)  cefTRIAXone (ROCEPHIN) 1 g in sodium chloride 0.9 % 100 mL IVPB (0 g Intravenous Stopped 09/03/22 0257)  azithromycin (ZITHROMAX) 500 mg in sodium chloride 0.9 % 250 mL IVPB (0 mg Intravenous Stopped 09/03/22 0357)    Mobility walks Moderate fall risk   Focused Assessments Pulmonary Assessment Handoff:  Lung sounds:   O2 Device: Room Air      R Recommendations: See Admitting Provider Note  Report given to:    Additional Notes:

## 2022-09-03 NOTE — ED Notes (Signed)
Pt on the bedside commode

## 2022-09-03 NOTE — Assessment & Plan Note (Addendum)
RSV positive, but I suspect this patient has a superimposed bacterial PNA as well given: 1) lobar appearance on CXR 2) WBC with increased neutrophils from baseline (while the patient has elevated total WBC at baseline due to CLL, today she has 16k neutrophils which is a definite change for her (usually most of her WBC is lymphocytes only thanks to CLL)) PNA pathway Empiric rocephin and azithro Urine for s.pneumo antigen

## 2022-09-03 NOTE — Assessment & Plan Note (Addendum)
H/o same in past Suspect hypovolemic hyponatremia in setting of acute illness / PNA Normal saline 125 cc/hr Repeat BMP this AM AND q6h Stop IVF and further work up if not improving rapidly, but SIADH less likely

## 2022-09-03 NOTE — ED Notes (Signed)
Date and time results received: 09/03/22 2:28 AM  (use smartphrase ".now" to insert current time)  Test: WBC Critical Value: 60.3  Name of Provider Notified: [facility]   Orders Received? Or Actions Taken?:  MAR

## 2022-09-03 NOTE — ED Triage Notes (Addendum)
Pt reports with cough x 1 week and SHOB that got worsened today. Pt reports seeing her pcp today and had a chest x ray.

## 2022-09-03 NOTE — H&P (Signed)
History and Physical    Patient: [name] [name] GLO:[id number] DOB: 02/13/1946 DOA: 09/02/2022 DOS: the patient was seen and examined on 09/03/2022 PCP: Deland Pretty, MD  Patient coming from: Home  Chief Complaint:  Chief Complaint  Patient presents with   Shortness of Breath   Cough   HPI: [name] [name] is a 77 y.o. female with medical history significant of CLL, hypothyroidism.  Pt presents to ED with c/o SOB and cough for about 1 week.  Got worse yesterday, seen by PCP who started her on Z pak.  Became confused yesterday evening and husband brought her to ED.  Having generalized weakness as well.   Review of Systems: As mentioned in the history of present illness. All other systems reviewed and are negative. Past Medical History:  Diagnosis Date   Alopecia    Anxiety    Arthritis    "spine" (07/28/2013)   Breast cancer ([place]) 1997; 2014   right   CAP (community acquired pneumonia)    admission 06-04-2013, failed outpatient   Chronic back pain    "lower back and upper neck" (07/28/2013)   CLL (chronic lymphocytic leukemia) ([place]) 05/2013   DDD (degenerative disc disease)    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 ear    Depression    Diverticulosis    Elevated LFTs    Fibromyalgia 1978   GERD (gastroesophageal reflux disease)    Hematuria    History of syncope    episode 2008--  no recurrence since   HLD (hyperlipidemia)    Hypothyroidism    Kidney stones 2014   Plantar fasciitis    Ruptured disk    one in neck and two in back   S/P chemotherapy, time since greater than 12 weeks    Sinus tachycardia    mild resting   Skin cancer    nose   Stool incontinence    "at times recently"    Past Surgical History:  Procedure Laterality Date   ANTERIOR LATERAL LUMBAR FUSION 4 LEVELS Left 04/25/2016   Procedure: LEFT LUMBAR ONE-TWO, LUMBAR TWO-THREE, LUMBAR THREE-FOUR, LUMBAR FOUR-FIVE ANTERIOR LATERAL LUMBAR FUSION;  Surgeon: Earnie Larsson, MD;  Location: [facility];  Service: Neurosurgery;   Laterality: Left;   APPENDECTOMY  1884   APPLICATION OF ROBOTIC ASSISTANCE FOR SPINAL PROCEDURE N/A 04/06/2017   Procedure: APPLICATION OF ROBOTIC ASSISTANCE FOR SPINAL PROCEDURE;  Surgeon: Earnie Larsson, MD;  Location: [facility];  Service: Neurosurgery;  Laterality: N/A;   BREAST BIOPSY Right 2014   BREAST LUMPECTOMY Right 1997   BREAST LUMPECTOMY WITH AXILLARY LYMPH NODE DISSECTION Right 1997   CATARACT EXTRACTION, BILATERAL  2019   Dr. Kathlen Mody   CYSTOSCOPY WITH RETROGRADE PYELOGRAM, URETEROSCOPY AND STENT PLACEMENT Bilateral 06/17/2013   Procedure: CYSTOSCOPY WITH RETROGRADE PYELOGRAM, URETEROSCOPY AND LEFT DOUBLE  J STENT PLACEMENT RIGHT URETERAL HOLMIIUM LASER AND DOUBLE J STENT ;  Surgeon: Hanley Ben, MD;  Location: [facility];  Service: Urology;  Laterality: Bilateral;   HOLMIUM LASER APPLICATION Bilateral 16/60/6301   Procedure: HOLMIUM LASER APPLICATION;  Surgeon: Hanley Ben, MD;  Location: [facility];  Service: Urology;  Laterality: Bilateral;   LITHOTRIPSY Left 06/2013   LUMBAR EPIDURAL INJECTION     has had 7 injections   PORT-A-CATH REMOVAL Left 10/03/2014   Procedure: REMOVAL PORT-A-CATH;  Surgeon: Donnie Mesa, MD;  Location: [facility];  Service: General;  Laterality: Left;   PORTACATH PLACEMENT Left 07/28/2013   Procedure: ATTEMPTED INSERTION PORT-A-CATH;  Surgeon: Imogene Burn.  Georgette Dover, MD;  Location: [facility];  Service: General;  Laterality: Left;   POSTERIOR LUMBAR FUSION 4 LEVEL N/A 04/25/2016   Procedure: LUMBAR FIVE-SACRAL ONE POSTERIOR LUMBAR INTERBODY FUSION, THORACIC NINE-SACRAL ONE POSTERIOR LATERAL ARTHRODESIS WITH PEDICLE SCREWS;  Surgeon: Earnie Larsson, MD;  Location: [facility];  Service: Neurosurgery;  Laterality: N/A;   RETINAL DETACHMENT SURGERY Right 2013   ROBOTIC ASSITED PARTIAL NEPHRECTOMY Right 02/05/2015   Procedure: ROBOTIC ASSITED PARTIAL NEPHRECTOMY;  Surgeon: Raynelle Bring, MD;  Location: [facility];  Service:  Urology;  Laterality: Right;   SIMPLE MASTECTOMY WITH AXILLARY SENTINEL NODE BIOPSY Right 07/28/2013   Procedure: RIGHT TOTAL  MASTECTOMY;  Surgeon: Imogene Burn. Georgette Dover, MD;  Location: [facility];  Service: General;  Laterality: Right;   TONSILLECTOMY  AGE 45   TOTAL ABDOMINAL HYSTERECTOMY W/ BILATERAL SALPINGOOPHORECTOMY  1997   Social History:  reports that she has never smoked. She has never used smokeless tobacco. She reports that she does not drink alcohol and does not use drugs.  Allergies  Allergen Reactions   Lasix [Furosemide] Nausea And Vomiting and Other (See Comments)    HEADACHE   Lithium Other (See Comments)    Dizziness, "cause me to fall"   Sulfa Antibiotics Hives   Trazodone And Nefazodone Other (See Comments)    insomnia   Lyrica [Pregabalin] Diarrhea, Nausea Only and Rash    Family History  Problem Relation Age of Onset   Heart disease Maternal Grandfather    Diabetes Maternal Grandfather    Colon cancer Maternal Aunt 25   Brain cancer Paternal Grandmother        dx in 42s   Dementia Mother    Diabetes Mother    Osteoporosis Mother    Diabetes Maternal Aunt    Prostate cancer Father 76   Bipolar disorder Maternal Aunt    Stroke Maternal Aunt    Stomach cancer Paternal Uncle        dx in late 56s    Prior to Admission medications   Medication Sig Start Date End Date Taking? Authorizing Provider  acalabrutinib maleate (CALQUENCE) 100 MG tablet Take 1 tablet by mouth 2 (two) times daily. 08/15/22   Orson Slick, MD  allopurinol (ZYLOPRIM) 300 MG tablet TAKE 1 TABLET(300 MG) BY MOUTH DAILY Patient taking differently: Take 300 mg by mouth daily. 09/03/21   Orson Slick, MD  amitriptyline (ELAVIL) 100 MG tablet Take 100 mg by mouth at bedtime. 09/25/21   [provider]  Apoaequorin (PREVAGEN PO) Take by mouth daily.    [provider]  aspirin 81 MG EC tablet Take 1 tablet by mouth daily.    [provider]  benzonatate (TESSALON)  200 MG capsule Take 200 mg by mouth 3 (three) times daily.    [provider]  brexpiprazole (REXULTI) 1 MG TABS tablet Take 1 mg by mouth daily.    [provider]  buPROPion (WELLBUTRIN XL) 300 MG 24 hr tablet Take 300 mg by mouth daily. 08/30/19   [provider]  CALCIUM CITRATE PO Take 1 tablet by mouth daily.    [provider]  Cholecalciferol (VITAMIN D3) 50 MCG (2000 UT) TABS Take 1 tablet by mouth daily.    [provider]  Cyanocobalamin (VITAMIN B12) 1000 MCG TBCR Take 1 tablet by mouth daily.    [provider]  diclofenac Sodium (VOLTAREN) 1 % GEL Apply 2 g topically 4 (four) times daily. Patient taking differently: Apply 2 g  topically daily as needed (for pain). 05/27/22   Raulkar, Clide Deutscher, MD  DULoxetine (CYMBALTA) 60 MG capsule Take 60 mg by mouth 2 (two) times daily. 08/30/19   [provider]  ferrous sulfate 325 (65 FE) MG tablet Take 325 mg by mouth daily with breakfast.    [provider]  gabapentin (NEURONTIN) 100 MG capsule Take 100 mg by mouth at bedtime. 08/20/21   [provider]  levothyroxine (SYNTHROID, LEVOTHROID) 88 MCG tablet Take 88 mcg by mouth daily before breakfast. 06/30/17   [provider]  Multiple Vitamins-Minerals (WOMENS 50+ MULTI VITAMIN/MIN) TABS Take 1 tablet by mouth daily.    [provider]  omeprazole (PRILOSEC) 40 MG capsule TAKE 1 CAPSULE(40 MG) BY MOUTH DAILY AFTER BREAKFAST 08/08/22   Orson Slick, MD  rosuvastatin (CRESTOR) 5 MG tablet TAKE 1 TABLET(5 MG) BY MOUTH DAILY Patient taking differently: Take 5 mg by mouth daily. 02/20/22   Jerline Pain, MD  topiramate (TOPAMAX) 25 MG tablet Take 1 tablet (25 mg total) by mouth at bedtime. Patient taking differently: Take 25 mg by mouth every evening. 05/27/22   Raulkar, Clide Deutscher, MD  UNABLE TO FIND Take 1 tablet by mouth 3 (three) times daily. Med Name: Metatrim    [provider]  zinc  gluconate 50 MG tablet Take 50 mg by mouth daily.    [provider]    Physical Exam: Vitals:   09/03/22 0315 09/03/22 0330 09/03/22 0345 09/03/22 0415  BP: (!) 119/52 (!) 103/42 121/82 (!) 114/55  Pulse: 95 88 93 94  Resp: 15 17 (!) 23 (!) 23  Temp:      TempSrc:      SpO2: 99% 98% 100% 96%  Weight:      Height:       Constitutional: NAD, calm, comfortable Eyes: PERRL, lids and conjunctivae normal ENMT: Mucous membranes are moist. Posterior pharynx clear of any exudate or lesions.Normal dentition.  Neck: normal, supple, no masses, no thyromegaly Respiratory: clear to auscultation bilaterally, no wheezing, no crackles. Normal respiratory effort. No accessory muscle use.  Cardiovascular: Regular rate and rhythm, no murmurs / rubs / gallops. No extremity edema. 2+ pedal pulses. No carotid bruits.  Abdomen: no tenderness, no masses palpated. No hepatosplenomegaly. Bowel sounds positive.  Musculoskeletal: no clubbing / cyanosis. No joint deformity upper and lower extremities. Good ROM, no contractures. Normal muscle tone.  Skin: no rashes, lesions, ulcers. No induration Neurologic: CN 2-12 grossly intact. Sensation intact, DTR normal. Strength 5/5 in all 4.  Psychiatric: Normal judgment and insight. Alert and oriented x 3. Normal mood.   Data Reviewed:       Latest Ref Rng & Units 09/03/2022   12:58 AM 08/06/2022    1:36 PM 07/08/2022   10:41 AM  CBC  WBC 4.0 - 10.5 K/uL 60.3  41.4  70.0   Hemoglobin 12.0 - 15.0 g/dL 8.9  9.9  10.4   Hematocrit 36.0 - 46.0 % 27.8  31.2  32.4   Platelets 150 - 400 K/uL 264  247  322       Latest Ref Rng & Units 09/03/2022   12:58 AM 08/06/2022    1:36 PM 07/08/2022   10:41 AM  CMP  Glucose 70 - 99 mg/dL 113  89  98   BUN 8 - 23 mg/dL '22  17  19   '$ Creatinine 0.44 - 1.00 mg/dL 0.92  0.75  0.98   Sodium 135 - 145 mmol/L  121  135  134   Potassium 3.5 - 5.1 mmol/L 3.9  4.8  4.7   Chloride 98 - 111 mmol/L 90  104  104   CO2 22 - 32  mmol/L '19  26  22   '$ Calcium 8.9 - 10.3 mg/dL 8.8  9.7  9.5   Total Protein 6.5 - 8.1 g/dL  5.8  6.9   Total Bilirubin 0.3 - 1.2 mg/dL  0.3  0.4   Alkaline Phos 38 - 126 U/L  69  80   AST 15 - 41 U/L  18  18   ALT 0 - 44 U/L  15  16    RSV positive  UA negative  Lactate nl  CXR showing RLL PNA  Assessment and Plan: * CAP (community acquired pneumonia) RSV positive, but I suspect this patient has a superimposed bacterial PNA as well given: 1) lobar appearance on CXR 2) WBC with increased neutrophils from baseline (while the patient has elevated total WBC at baseline due to CLL, today she has 16k neutrophils which is a definite change for her (usually most of her WBC is lymphocytes only thanks to CLL)) PNA pathway Empiric rocephin and azithro Urine for s.pneumo antigen  Acute encephalopathy Delirium secondary to PNA vs metabolic encephalopathy secondary to hyponatremia. Treating each as above. Mental status already seems somewhat improved in ED after IVF.  Hyponatremia H/o same in past Suspect hypovolemic hyponatremia in setting of acute illness / PNA Normal saline 125 cc/hr Repeat BMP this AM AND q6h Stop IVF and further work up if not improving rapidly, but SIADH less likely  RSV infection RSV positive on testing today. Supportive care for this. No wheezing.      Advance Care Planning:   Code Status: Full Code confirmed with pt and husband  Consults: None  Family Communication: Husband at bedside  Severity of Illness: The appropriate patient status for this patient is OBSERVATION. Observation status is judged to be reasonable and necessary in order to provide the required intensity of service to ensure the patient's safety. The patient's presenting symptoms, physical exam findings, and initial radiographic and laboratory data in the context of their medical condition is felt to place them at decreased risk for further clinical deterioration. Furthermore, it is  anticipated that the patient will be medically stable for discharge from the hospital within 2 midnights of admission.   Author: Etta Quill., DO 09/03/2022 5:19 AM  For on call review [email or web address].

## 2022-09-03 NOTE — Assessment & Plan Note (Addendum)
Delirium secondary to PNA vs metabolic encephalopathy secondary to hyponatremia. Treating each as above. Mental status already seems somewhat improved in ED after IVF.

## 2022-09-03 NOTE — Assessment & Plan Note (Signed)
RSV positive on testing today. Supportive care for this. No wheezing.

## 2022-09-03 NOTE — ED Provider Notes (Signed)
[facility] DEPT Provider Note: Georgena Spurling, MD, FACEP  CSN: [id number] MRN: [id number] ARRIVAL: 09/02/22 at [place]: WA20/WA20   CHIEF COMPLAINT  Shortness of Breath and Cough   HISTORY OF PRESENT ILLNESS  09/03/22 12:28 AM [name] [name] is a 77 y.o. female with CLL.  She has had shortness of breath and a cough for about a week.  It worsened yesterday and she was seen by her PCP.  She had a chest x-ray done but the results are not available to me.  Her PCP started her on a Z-Pak.  Yesterday evening the patient became more confused and her husband brought her to the ED.  He is the principal historian.  She is having generalized weakness as well.   Past Medical History:  Diagnosis Date   Alopecia    Anxiety    Arthritis    "spine" (07/28/2013)   Breast cancer ([place]) 1997; 2014   right   CAP (community acquired pneumonia)    admission 06-04-2013, failed outpatient   Chronic back pain    "lower back and upper neck" (07/28/2013)   CLL (chronic lymphocytic leukemia) ([place]) 05/2013   DDD (degenerative disc disease)    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 ear    Depression    Diverticulosis    Elevated LFTs    Fibromyalgia 1978   GERD (gastroesophageal reflux disease)    Hematuria    History of syncope    episode 2008--  no recurrence since   HLD (hyperlipidemia)    Hypothyroidism    Kidney stones 2014   Plantar fasciitis    Ruptured disk    one in neck and two in back   S/P chemotherapy, time since greater than 12 weeks    Sinus tachycardia    mild resting   Skin cancer    nose   Stool incontinence    "at times recently"     Past Surgical History:  Procedure Laterality Date   ANTERIOR LATERAL LUMBAR FUSION 4 LEVELS Left 04/25/2016   Procedure: LEFT LUMBAR ONE-TWO, LUMBAR TWO-THREE, LUMBAR THREE-FOUR, LUMBAR FOUR-FIVE ANTERIOR LATERAL LUMBAR FUSION;  Surgeon: Earnie Larsson, MD;  Location: [facility];  Service: Neurosurgery;  Laterality: Left;   APPENDECTOMY  2947   APPLICATION OF  ROBOTIC ASSISTANCE FOR SPINAL PROCEDURE N/A 04/06/2017   Procedure: APPLICATION OF ROBOTIC ASSISTANCE FOR SPINAL PROCEDURE;  Surgeon: Earnie Larsson, MD;  Location: [facility] [place];  Service: Neurosurgery;  Laterality: N/A;   BREAST BIOPSY Right 2014   BREAST LUMPECTOMY Right 1997   BREAST LUMPECTOMY WITH AXILLARY LYMPH NODE DISSECTION Right 1997   CATARACT EXTRACTION, BILATERAL  2019   Dr. Kathlen Mody   CYSTOSCOPY WITH RETROGRADE PYELOGRAM, URETEROSCOPY AND STENT PLACEMENT Bilateral 06/17/2013   Procedure: CYSTOSCOPY WITH RETROGRADE PYELOGRAM, URETEROSCOPY AND LEFT DOUBLE  J STENT PLACEMENT RIGHT URETERAL HOLMIIUM LASER AND DOUBLE J STENT ;  Surgeon: Hanley Ben, MD;  Location: [facility];  Service: Urology;  Laterality: Bilateral;   HOLMIUM LASER APPLICATION Bilateral 65/46/5035   Procedure: HOLMIUM LASER APPLICATION;  Surgeon: Hanley Ben, MD;  Location: [facility];  Service: Urology;  Laterality: Bilateral;   LITHOTRIPSY Left 06/2013   LUMBAR EPIDURAL INJECTION     has had 7 injections   PORT-A-CATH REMOVAL Left 10/03/2014   Procedure: REMOVAL PORT-A-CATH;  Surgeon: Donnie Mesa, MD;  Location: [facility];  Service: General;  Laterality: Left;   PORTACATH PLACEMENT Left 07/28/2013   Procedure: ATTEMPTED INSERTION PORT-A-CATH;  Surgeon: Imogene Burn. Tsuei,  MD;  Location: [facility];  Service: General;  Laterality: Left;   POSTERIOR LUMBAR FUSION 4 LEVEL N/A 04/25/2016   Procedure: LUMBAR FIVE-SACRAL ONE POSTERIOR LUMBAR INTERBODY FUSION, THORACIC NINE-SACRAL ONE POSTERIOR LATERAL ARTHRODESIS WITH PEDICLE SCREWS;  Surgeon: Earnie Larsson, MD;  Location: [facility];  Service: Neurosurgery;  Laterality: N/A;   RETINAL DETACHMENT SURGERY Right 2013   ROBOTIC ASSITED PARTIAL NEPHRECTOMY Right 02/05/2015   Procedure: ROBOTIC ASSITED PARTIAL NEPHRECTOMY;  Surgeon: Raynelle Bring, MD;  Location: [facility];  Service: Urology;  Laterality: Right;   SIMPLE MASTECTOMY WITH AXILLARY  SENTINEL NODE BIOPSY Right 07/28/2013   Procedure: RIGHT TOTAL  MASTECTOMY;  Surgeon: Imogene Burn. Georgette Dover, MD;  Location: [facility];  Service: General;  Laterality: Right;   TONSILLECTOMY  AGE 32   TOTAL ABDOMINAL HYSTERECTOMY W/ BILATERAL SALPINGOOPHORECTOMY  1997    Family History  Problem Relation Age of Onset   Heart disease Maternal Grandfather    Diabetes Maternal Grandfather    Colon cancer Maternal Aunt 32   Brain cancer Paternal Grandmother        dx in 32s   Dementia Mother    Diabetes Mother    Osteoporosis Mother    Diabetes Maternal Aunt    Prostate cancer Father 49   Bipolar disorder Maternal Aunt    Stroke Maternal Aunt    Stomach cancer Paternal Uncle        dx in late 64s    Social History   Tobacco Use   Smoking status: Never   Smokeless tobacco: Never  Vaping Use   Vaping Use: Never used  Substance Use Topics   Alcohol use: No   Drug use: No    Prior to Admission medications   Medication Sig Start Date End Date Taking? Authorizing Provider  acalabrutinib maleate (CALQUENCE) 100 MG tablet Take 1 tablet by mouth 2 (two) times daily. 08/15/22   Orson Slick, MD  allopurinol (ZYLOPRIM) 300 MG tablet TAKE 1 TABLET(300 MG) BY MOUTH DAILY Patient taking differently: Take 300 mg by mouth daily. 09/03/21   Orson Slick, MD  amitriptyline (ELAVIL) 100 MG tablet Take 100 mg by mouth at bedtime. 09/25/21   [provider]  Apoaequorin (PREVAGEN PO) Take by mouth daily.    [provider]  aspirin 81 MG EC tablet Take 1 tablet by mouth daily.    [provider]  benzonatate (TESSALON) 200 MG capsule Take 200 mg by mouth 3 (three) times daily.    [provider]  brexpiprazole (REXULTI) 1 MG TABS tablet Take 1 mg by mouth daily.    [provider]  buPROPion (WELLBUTRIN XL) 300 MG 24 hr tablet Take 300 mg by mouth daily. 08/30/19   [provider]  CALCIUM CITRATE PO Take 1 tablet by mouth daily.    [provider]  Cholecalciferol (VITAMIN D3) 50 MCG (2000 UT) TABS Take 1 tablet by mouth daily.    [provider]  Cyanocobalamin (VITAMIN B12) 1000 MCG TBCR Take 1 tablet by mouth daily.    [provider]  diclofenac Sodium (VOLTAREN) 1 % GEL Apply 2 g topically 4 (four) times daily. Patient taking differently: Apply 2 g topically daily as needed (for pain). 05/27/22   Raulkar, Clide Deutscher, MD  DULoxetine (CYMBALTA) 60 MG capsule Take 60 mg by mouth 2 (two) times daily. 08/30/19   [provider]  ferrous sulfate 325 (65 FE) MG tablet Take 325 mg by mouth daily with  breakfast.    [provider]  gabapentin (NEURONTIN) 100 MG capsule Take 100 mg by mouth at bedtime. 08/20/21   [provider]  levothyroxine (SYNTHROID, LEVOTHROID) 88 MCG tablet Take 88 mcg by mouth daily before breakfast. 06/30/17   [provider]  Multiple Vitamins-Minerals (WOMENS 50+ MULTI VITAMIN/MIN) TABS Take 1 tablet by mouth daily.    [provider]  omeprazole (PRILOSEC) 40 MG capsule TAKE 1 CAPSULE(40 MG) BY MOUTH DAILY AFTER BREAKFAST 08/08/22   Orson Slick, MD  rosuvastatin (CRESTOR) 5 MG tablet TAKE 1 TABLET(5 MG) BY MOUTH DAILY Patient taking differently: Take 5 mg by mouth daily. 02/20/22   Jerline Pain, MD  topiramate (TOPAMAX) 25 MG tablet Take 1 tablet (25 mg total) by mouth at bedtime. Patient taking differently: Take 25 mg by mouth every evening. 05/27/22   Raulkar, Clide Deutscher, MD  UNABLE TO FIND Take 1 tablet by mouth 3 (three) times daily. Med Name: Metatrim    [provider]  zinc gluconate 50 MG tablet Take 50 mg by mouth daily.    [provider]    Allergies Lasix [furosemide], Lithium, Sulfa antibiotics, Trazodone and nefazodone, and Lyrica [pregabalin]   REVIEW OF SYSTEMS  Negative except as noted here or in the History of Present Illness.   PHYSICAL EXAMINATION  Initial Vital Signs Blood pressure (!)  125/56, pulse 94, temperature 99.4 F (37.4 C), temperature source Oral, resp. rate 16, height '5\' 3"'$  (1.6 m), weight 71.4 kg, last menstrual period 08/26/1995, SpO2 96 %.  Examination General: Well-developed, well-nourished female in no acute distress; appearance consistent with age of record HENT: normocephalic; atraumatic Eyes: pupils equal, round and reactive to light; extraocular muscles intact Neck: supple Heart: regular rate and rhythm Lungs: clear to auscultation bilaterally Abdomen: soft; nondistended; nontender; bowel sounds present Extremities: No deformity; full range of motion; pulses normal Neurologic: Awake, alert; minimally conversant; motor function intact in all extremities and symmetric; no facial droop; generalized weakness Skin: Warm and dry Psychiatric: Flat affect   RESULTS  Summary of this visit's results, reviewed and interpreted by myself:   EKG Interpretation  Date/Time:  Wednesday September 03 2022 00:07:33 EST Ventricular Rate:  99 PR Interval:  154 QRS Duration: 80 QT Interval:  323 QTC Calculation: 415 R Axis:   35 Text Interpretation: Sinus rhythm Abnormal R-wave progression, early transition No significant change was found Confirmed by Shanon Rosser [phone]) on 09/03/2022 12:10:55 AM       Laboratory Studies: Results for orders placed or performed during the hospital encounter of 09/02/22 (from the past 24 hour(s))  Resp panel by RT-PCR (RSV, Flu A&B, Covid) Anterior Nasal Swab     Status: Abnormal   Collection Time: 09/03/22 12:18 AM   Specimen: Anterior Nasal Swab  Result Value Ref Range   SARS Coronavirus 2 by RT PCR NEGATIVE NEGATIVE   Influenza A by PCR NEGATIVE NEGATIVE   Influenza B by PCR NEGATIVE NEGATIVE   Resp Syncytial Virus by PCR POSITIVE (A) NEGATIVE  CBC with Differential     Status: Abnormal   Collection Time: 09/03/22 12:58 AM  Result Value Ref Range   WBC 60.3 (HH) 4.0 - 10.5 K/uL   RBC 2.93 (L) 3.87 - 5.11 MIL/uL    Hemoglobin 8.9 (L) 12.0 - 15.0 g/dL   HCT 27.8 (L) 36.0 - 46.0 %   MCV 94.9 80.0 - 100.0 fL   MCH 30.4 26.0 - 34.0 pg   MCHC 32.0 30.0 -  36.0 g/dL   RDW 13.2 11.5 - 15.5 %   Platelets 264 150 - 400 K/uL   nRBC 0.0 0.0 - 0.2 %   Neutrophils Relative % 27 %   Neutro Abs 16.5 (H) 1.7 - 7.7 K/uL   Lymphocytes Relative 68 %   Lymphs Abs 40.5 (H) 0.7 - 4.0 K/uL   Monocytes Relative 4 %   Monocytes Absolute 2.5 (H) 0.1 - 1.0 K/uL   Eosinophils Relative 0 %   Eosinophils Absolute 0.1 0.0 - 0.5 K/uL   Basophils Relative 0 %   Basophils Absolute 0.1 0.0 - 0.1 K/uL   Immature Granulocytes 1 %   Abs Immature Granulocytes 0.65 (H) 0.00 - 0.07 K/uL  Basic metabolic panel     Status: Abnormal   Collection Time: 09/03/22 12:58 AM  Result Value Ref Range   Sodium 121 (L) 135 - 145 mmol/L   Potassium 3.9 3.5 - 5.1 mmol/L   Chloride 90 (L) 98 - 111 mmol/L   CO2 19 (L) 22 - 32 mmol/L   Glucose, Bld 113 (H) 70 - 99 mg/dL   BUN 22 8 - 23 mg/dL   Creatinine, Ser 0.92 0.44 - 1.00 mg/dL   Calcium 8.8 (L) 8.9 - 10.3 mg/dL   GFR, Estimated >60 >60 mL/min   Anion gap 12 5 - 15  Lactic acid, plasma     Status: None   Collection Time: 09/03/22 12:58 AM  Result Value Ref Range   Lactic Acid, Venous 0.9 0.5 - 1.9 mmol/L  Urinalysis, Routine w reflex microscopic Urine, In & Out Cath     Status: None   Collection Time: 09/03/22  1:41 AM  Result Value Ref Range   Color, Urine YELLOW YELLOW   APPearance CLEAR CLEAR   Specific Gravity, Urine 1.014 1.005 - 1.030   pH 5.0 5.0 - 8.0   Glucose, UA NEGATIVE NEGATIVE mg/dL   Hgb urine dipstick NEGATIVE NEGATIVE   Bilirubin Urine NEGATIVE NEGATIVE   Ketones, ur NEGATIVE NEGATIVE mg/dL   Protein, ur NEGATIVE NEGATIVE mg/dL   Nitrite NEGATIVE NEGATIVE   Leukocytes,Ua NEGATIVE NEGATIVE   *Note: Due to a large number of results and/or encounters for the requested time period, some results have not been displayed. A complete set of results can be found in Results  Review.   Imaging Studies: DG Chest Port 1 View  Result Date: 09/03/2022 CLINICAL DATA:  Cough and shortness of breath EXAM: PORTABLE CHEST 1 VIEW COMPARISON:  10/10/2020 FINDINGS: Normal cardiomediastinal silhouette. Aortic atherosclerotic calcification. Hazy airspace and interstitial opacities in the right lower lung. Mild diffuse interstitial coarsening. Low lung volumes. Trace right pleural effusion. No pneumothorax. Thoracolumbar fusion hardware. Surgical clips right axilla. IMPRESSION: Right lower lung pneumonia. Electronically Signed   By: Placido Sou M.D.   On: 09/03/2022 01:51    ED COURSE and MDM  Nursing notes, initial and subsequent vitals signs, including pulse oximetry, reviewed and interpreted by myself.  Vitals:   09/03/22 0008 09/03/22 0016 09/03/22 0030 09/03/22 0140  BP: (!) 125/56  (!) 129/112 115/72  Pulse: 94  86 97  Resp: '16  18 18  '$ Temp: 99.4 F (37.4 C)     TempSrc: Oral     SpO2: 96%  96% 96%  Weight:  71.4 kg    Height:  '5\' 3"'$  (1.6 m)     Medications  cefTRIAXone (ROCEPHIN) 1 g in sodium chloride 0.9 % 100 mL IVPB (1 g Intravenous New Bag/Given 09/03/22 0224)  azithromycin (  ZITHROMAX) 500 mg in sodium chloride 0.9 % 250 mL IVPB (has no administration in time range)  0.9 %  sodium chloride infusion ( Intravenous New Bag/Given 09/03/22 0154)   She has positive for RSV.  She is also significantly hyponatremic with a sodium of 121.  We will have her admitted.  It is unclear which of these is the cause of her altered mental status.  1:56 AM Rocephin Zithromax ordered for right lower lobe pneumonia.  2:49 AM Dr. Alcario Drought to admit to hospitalist service.  PROCEDURES  Procedures   ED DIAGNOSES     ICD-10-CM   1. RSV (respiratory syncytial virus infection)  B33.8     2. Confusion  R41.0     3. Hyponatremia  E87.1     4. Community acquired pneumonia of right lower lobe of lung  J18.9          Elva Breaker, MD 09/03/22 [phone]

## 2022-09-03 NOTE — Progress Notes (Signed)
Pt seen and admitted earlier this am.   [name] [name] is a 77 y.o. female with medical history significant of CLL, hypothyroidism.  presents to ED with c/o SOB and cough for about 1 week.  She was admitted for CAP, started on IV antibiotics. . Continue the same.  She was also found to have hyponatremia.   Continue with gentle hydration and IV antibiotics.    Hosie Poisson, MD

## 2022-09-03 NOTE — TOC CM/SW Note (Signed)
Transition of Care [facility]) Screening Note  Patient Details  Name: [name] [name] Date of Birth: Jul 07, 1946  Transition of [facility]) CM/SW Contact:    Sherie Don, LCSW Phone Number: 09/03/2022, 1:55 PM  Transition of Care Department [facility]) has reviewed patient and no TOC needs have been identified at this time. We will continue to monitor patient advancement through interdisciplinary progression rounds. If new patient transition needs arise, please place a TOC consult.

## 2022-09-04 ENCOUNTER — Encounter: Payer: Self-pay | Admitting: Oncology

## 2022-09-04 DIAGNOSIS — G9340 Encephalopathy, unspecified: Secondary | ICD-10-CM | POA: Diagnosis not present

## 2022-09-04 DIAGNOSIS — R410 Disorientation, unspecified: Secondary | ICD-10-CM

## 2022-09-04 DIAGNOSIS — E871 Hypo-osmolality and hyponatremia: Secondary | ICD-10-CM | POA: Diagnosis not present

## 2022-09-04 DIAGNOSIS — J189 Pneumonia, unspecified organism: Secondary | ICD-10-CM | POA: Diagnosis not present

## 2022-09-04 DIAGNOSIS — B338 Other specified viral diseases: Secondary | ICD-10-CM | POA: Diagnosis not present

## 2022-09-04 LAB — CBC WITH DIFFERENTIAL/PLATELET
Abs Immature Granulocytes: 0.68 10*3/uL — ABNORMAL HIGH (ref 0.00–0.07)
Basophils Absolute: 0.1 10*3/uL (ref 0.0–0.1)
Basophils Relative: 0 %
Eosinophils Absolute: 0.1 10*3/uL (ref 0.0–0.5)
Eosinophils Relative: 0 %
HCT: 24.7 % — ABNORMAL LOW (ref 36.0–46.0)
Hemoglobin: 8 g/dL — ABNORMAL LOW (ref 12.0–15.0)
Immature Granulocytes: 1 %
Lymphocytes Relative: 65 %
Lymphs Abs: 34.9 10*3/uL — ABNORMAL HIGH (ref 0.7–4.0)
MCH: 31.4 pg (ref 26.0–34.0)
MCHC: 32.4 g/dL (ref 30.0–36.0)
MCV: 96.9 fL (ref 80.0–100.0)
Monocytes Absolute: 2.2 10*3/uL — ABNORMAL HIGH (ref 0.1–1.0)
Monocytes Relative: 4 %
Neutro Abs: 16.1 10*3/uL — ABNORMAL HIGH (ref 1.7–7.7)
Neutrophils Relative %: 30 %
Platelets: 250 10*3/uL (ref 150–400)
RBC: 2.55 MIL/uL — ABNORMAL LOW (ref 3.87–5.11)
RDW: 13.5 % (ref 11.5–15.5)
WBC: 53.9 10*3/uL (ref 4.0–10.5)
nRBC: 0 % (ref 0.0–0.2)

## 2022-09-04 LAB — BASIC METABOLIC PANEL
Anion gap: 7 (ref 5–15)
Anion gap: 11 (ref 5–15)
BUN: 10 mg/dL (ref 8–23)
BUN: 9 mg/dL (ref 8–23)
CO2: 18 mmol/L — ABNORMAL LOW (ref 22–32)
CO2: 15 mmol/L — ABNORMAL LOW (ref 22–32)
Calcium: 8.5 mg/dL — ABNORMAL LOW (ref 8.9–10.3)
Calcium: 8.1 mg/dL — ABNORMAL LOW (ref 8.9–10.3)
Chloride: 105 mmol/L (ref 98–111)
Chloride: 102 mmol/L (ref 98–111)
Creatinine, Ser: 0.71 mg/dL (ref 0.44–1.00)
Creatinine, Ser: 0.73 mg/dL (ref 0.44–1.00)
GFR, Estimated: >60 mL/min (ref >60)
GFR, Estimated: >60 mL/min (ref >60)
Glucose, Bld: 100 mg/dL — ABNORMAL HIGH (ref 70–99)
Glucose, Bld: 106 mg/dL — ABNORMAL HIGH (ref 70–99)
Potassium: 3.7 mmol/L (ref 3.5–5.1)
Potassium: 3.7 mmol/L (ref 3.5–5.1)
Sodium: 130 mmol/L — ABNORMAL LOW (ref 135–145)
Sodium: 128 mmol/L — ABNORMAL LOW (ref 135–145)

## 2022-09-04 LAB — CORTISOL: Cortisol, Plasma: 19.5 ug/dL

## 2022-09-04 LAB — OSMOLALITY: Osmolality: 258 mOsm/kg — ABNORMAL LOW (ref 275–295)

## 2022-09-04 LAB — OSMOLALITY, URINE: Osmolality, Ur: 345 mOsm/kg (ref 300–900)

## 2022-09-04 LAB — EXPECTORATED SPUTUM ASSESSMENT W GRAM STAIN, RFLX TO RESP C

## 2022-09-04 LAB — HIV ANTIBODY (ROUTINE TESTING W REFLEX): HIV Screen 4th Generation wRfx: NONREACTIVE

## 2022-09-04 LAB — TSH: TSH: 3.383 u[IU]/mL (ref 0.350–4.500)

## 2022-09-04 MED ORDER — ACALABRUTINIB MALEATE 100 MG PO TABS: 100.0000 mg | ORAL_TABLET | Freq: Two times a day (BID) | ORAL | Status: DC

## 2022-09-04 MED ORDER — METHYLPREDNISOLONE SODIUM SUCC 40 MG IJ SOLR
40.0000 mg | INTRAMUSCULAR | Status: DC
  Administered 2022-09-04 – 2022-09-06 (×3): 40 mg via INTRAVENOUS
  Filled 2022-09-04 (×3): qty 1

## 2022-09-04 MED ORDER — IPRATROPIUM-ALBUTEROL 0.5-2.5 (3) MG/3ML IN SOLN
3.0000 mL | Freq: Four times a day (QID) | RESPIRATORY_TRACT | Status: DC | PRN
  Administered 2022-09-04: 3 mL via RESPIRATORY_TRACT
  Filled 2022-09-04: qty 3

## 2022-09-04 MED ORDER — IPRATROPIUM-ALBUTEROL 0.5-2.5 (3) MG/3ML IN SOLN
3.0000 mL | Freq: Once | RESPIRATORY_TRACT | Status: AC
  Administered 2022-09-04: 3 mL via RESPIRATORY_TRACT
  Filled 2022-09-04: qty 3

## 2022-09-04 MED ORDER — ROSUVASTATIN CALCIUM 5 MG PO TABS
5.0000 mg | ORAL_TABLET | Freq: Every day | ORAL | Status: DC
  Administered 2022-09-04 – 2022-09-06 (×3): 5 mg via ORAL
  Filled 2022-09-04 (×3): qty 1

## 2022-09-04 NOTE — Evaluation (Addendum)
Physical Therapy Evaluation Patient Details Name: [name] [name] MRN: [id number] DOB: [birth year] Today's Date: 09/04/2022  History of Present Illness  [name] [name] is a 77 y.o. female with medical history significant of CLL, hypothyroidism, significant back surgeries/fusion.Marland Kitchen     Pt presents to ED 09/02/22  with c/o SOB and cough, weakness , and confused, positive for RSV, and  significant hyponatremia and Community acquired pneumonia of right lower lobe of lung  Clinical Impression  Pt admitted with above diagnosis.  Pt currently with functional limitations due to the deficits listed below (see PT Problem List). Pt will benefit from skilled PT to increase their independence and safety with mobility to allow discharge to the venue listed below.     The patient is resting in bed, frequent cough and asking why she I hoarse.  Patient  requires min guard assistance to ambulate in room, pushing IV pole. Patient should progress to return home with spouse  support.  SPO2 >93% during mobility on  RA. Patient reports that she was scheduled for back surgery  soon.     Recommendations for follow up therapy are one component of a multi-disciplinary discharge planning process, led by the attending physician.  Recommendations may be updated based on patient status, additional functional criteria and insurance authorization.  Follow Up Recommendations No PT follow up      Assistance Recommended at Discharge Intermittent Supervision/Assistance  Patient can return home with the following  A little help with bathing/dressing/bathroom;Assistance with cooking/housework;Assist for transportation;Help with stairs or ramp for entrance    Equipment Recommendations None recommended by PT  Recommendations for Other Services       Functional Status Assessment Patient has had a recent decline in their functional status and demonstrates the ability to make significant improvements in function in a reasonable and  predictable amount of time.     Precautions / Restrictions Precautions Precautions: Fall Precaution Comments: monitor sats Restrictions Weight Bearing Restrictions: No      Mobility  Bed Mobility Overal bed mobility: Modified Independent                  Transfers Overall transfer level: Needs assistance Equipment used: None Transfers: Sit to/from Stand Sit to Stand: Min guard           General transfer comment: stood , a little staggering, reached for IV pole to steady self    Ambulation/Gait Ambulation/Gait assistance: Min guard Gait Distance (Feet): 20 Feet (x 2) Assistive device: IV Pole Gait Pattern/deviations: Step-through pattern       General Gait Details: gait  mildly unsteady, requires UE suupport  Stairs            Wheelchair Mobility    Modified Rankin (Stroke Patients Only)       Balance Overall balance assessment: Mild deficits observed, not formally tested                                           Pertinent Vitals/Pain Pain Assessment Pain Assessment: Faces Faces Pain Scale: Hurts a little bit Pain Location: back Pain Descriptors / Indicators: Discomfort Pain Intervention(s): Monitored during session    Home Living Family/patient expects to be discharged to:: Private residence Living Arrangements: Spouse/significant other Available Help at Discharge: Family;Available 24 hours/day Type of Home: House Home Access: Stairs to enter Entrance Stairs-Rails: Right;Left Entrance Stairs-Number of Steps: 2back 3 porch  Home Layout: One level Home Equipment: Conservation officer, nature (2 wheels);Cane - single point      Prior Function Prior Level of Function : Independent/Modified Independent                     Hand Dominance   Dominant Hand: Right    Extremity/Trunk Assessment   Upper Extremity Assessment Upper Extremity Assessment: Overall WFL for tasks assessed    Lower Extremity Assessment Lower  Extremity Assessment: Overall WFL for tasks assessed    Cervical / Trunk Assessment Cervical / Trunk Assessment: Kyphotic  Communication   Communication: No difficulties  Cognition Arousal/Alertness: Awake/alert Behavior During Therapy: WFL for tasks assessed/performed Overall Cognitive Status: Within Functional Limits for tasks assessed                                          General Comments      Exercises     Assessment/Plan    PT Assessment Patient needs continued PT services  PT Problem List Decreased balance;Decreased mobility;Decreased activity tolerance       PT Treatment Interventions DME instruction;Functional mobility training;Balance training;Patient/family education;Gait training;Therapeutic activities;Therapeutic exercise    PT Goals (Current goals can be found in the Care Plan section)  Acute Rehab PT Goals Patient Stated Goal: to get better and go home PT Goal Formulation: With patient Time For Goal Achievement: 09/18/22 Potential to Achieve Goals: Good    Frequency Min 3X/week     Co-evaluation               AM-PAC PT "6 Clicks" Mobility  Outcome Measure Help needed turning from your back to your side while in a flat bed without using bedrails?: None Help needed moving from lying on your back to sitting on the side of a flat bed without using bedrails?: None Help needed moving to and from a bed to a chair (including a wheelchair)?: A Little Help needed standing up from a chair using your arms (e.g., wheelchair or bedside chair)?: A Little Help needed to walk in hospital room?: A Little Help needed climbing 3-5 steps with a railing? : A Lot 6 Click Score: 19    End of Session   Activity Tolerance: Patient tolerated treatment well Patient left: in chair;with chair alarm set;with call bell/phone within reach Nurse Communication: Mobility status PT Visit Diagnosis: Unsteadiness on feet (R26.81)    Time: 1245-8099 PT Time  Calculation (min) (ACUTE ONLY): 39 min   Charges:   PT Evaluation $PT Eval Low Complexity: 1 Low PT Treatments $Gait Training: 8-22 mins $Self Care/Home Management: [name] [name] Office [phone] Weekend [phone]   Claretha Cooper 09/04/2022, 1:24 PM

## 2022-09-04 NOTE — Progress Notes (Signed)
[facility]                                                                               [name] [name], is a 77 y.o. female, DOB - May 06, 1946, WNU:[id number] Admit date - 09/02/2022    Outpatient Primary MD for the patient is Deland Pretty, MD  LOS - 1  days    Brief summary    [name] [name] is a 77 y.o. female with medical history significant of CLL, hypothyroidism.  presents to ED with c/o SOB and cough for about 1 week.  She was admitted for CAP, started on IV antibiotics. . Continue the same.  She was also found to have hyponatremia.     Assessment & Plan    Assessment and Plan:   Acute metabolic encephalopathy probably from hyponatremia .  Which appears to have resolved.    CLL  Chemo on hold as per Dr Lorenso Courier   Anemia of chronic disease:  - hemoglobin is stable at 8.    Hyponatremia:  Suspect from cymbalta and dehydration. Also a component of SIADH.  Resolved with IV fluids.  Serum osmo is low.      RLL pneumonia.  Started her on IV antibiotics.  [place] oxygen to keep sats greater thatn 90%. Sputum cultures ordered.  SLP eval will be ordered.  Strep pneumonia is negative.     RSV bronchitis:  Started on IV solumedrol 40 mg daily.  Continue with bronchodilators.    Hyperlipidemia:  Resume crestor.     GERD  Resume PPI.       RN Pressure Injury Documentation:   Estimated body mass index is 27.88 kg/m as calculated from the following:   Height as of this encounter: '5\' 3"'$  (1.6 m).   Weight as of this encounter: 71.4 kg.  Code Status: full code.  DVT Prophylaxis:  enoxaparin (LOVENOX) injection 40 mg Start: 09/03/22 1000   Level of Care: Level of care: Med-Surg Family Communication: none at bedside.   Disposition Plan:     Remains inpatient appropriate:  for IV antibiotics.    Procedures:  None.   Consultants:   None.   Antimicrobials:   Anti-infectives (From admission, onward)    Start     Dose/Rate Route  Frequency Ordered Stop   09/04/22 0200  cefTRIAXone (ROCEPHIN) 2 g in sodium chloride 0.9 % 100 mL IVPB        2 g 200 mL/hr over 30 Minutes Intravenous Every 24 hours 09/03/22 0511 09/09/22 0159   09/04/22 0200  azithromycin (ZITHROMAX) 500 mg in sodium chloride 0.9 % 250 mL IVPB        500 mg 250 mL/hr over 60 Minutes Intravenous Every 24 hours 09/03/22 0511 09/09/22 0159   09/03/22 0200  cefTRIAXone (ROCEPHIN) 1 g in sodium chloride 0.9 % 100 mL IVPB        1 g 200 mL/hr over 30 Minutes Intravenous  Once 09/03/22 0156 09/03/22 0257   09/03/22 0200  azithromycin (ZITHROMAX) 500 mg in sodium chloride 0.9 % 250 mL IVPB        500 mg 250 mL/hr over 60 Minutes Intravenous  Once 09/03/22 0156 09/03/22  0354        Medications  Scheduled Meds:  acalabrutinib maleate  100 mg Oral BID   benzonatate  200 mg Oral TID   brexpiprazole  1 mg Oral Daily   buPROPion  300 mg Oral Daily   cyanocobalamin  1,000 mcg Oral Daily   enoxaparin (LOVENOX) injection  40 mg Subcutaneous Q24H   ferrous sulfate  325 mg Oral Q breakfast   gabapentin  100 mg Oral QHS   levothyroxine  88 mcg Oral Q0600   pantoprazole  40 mg Oral Daily   rosuvastatin  5 mg Oral Daily   topiramate  25 mg Oral QPM   Continuous Infusions:  sodium chloride 75 mL/hr at 09/04/22 0950   azithromycin Stopped (09/04/22 0357)   cefTRIAXone (ROCEPHIN)  IV Stopped (09/04/22 0213)   PRN Meds:.guaiFENesin, ipratropium-albuterol, mouth rinse, phenol    Subjective:   [name] [name] was seen and examined today.  Coughing, sob improving.   Objective:   Vitals:   09/04/22 0650 09/04/22 0831 09/04/22 1034 09/04/22 1255  BP: 121/60 (!) 115/58  (!) 118/55  Pulse: 99 98  89  Resp: '17 18  18  '$ Temp: 98.7 F (37.1 C) 98.4 F (36.9 C)  98.5 F (36.9 C)  TempSrc: Oral Oral  Oral  SpO2: 99% 97% 97% 99%  Weight:      Height:        Intake/Output Summary (Last 24 hours) at 09/04/2022 1431 Last data filed at 09/04/2022 0950 Gross  per 24 hour  Intake 2289.88 ml  Output 1250 ml  Net 1039.88 ml   Filed Weights   09/03/22 0016  Weight: 71.4 kg     Exam General exam: Appears calm and comfortable  Respiratory system: Clear to auscultation. Respiratory effort normal. Cardiovascular system: S1 & S2 heard, RRR. No JVD,  Gastrointestinal system: Abdomen is nondistended, soft and nontender.  Central nervous system: Alert and oriented. No focal neurological deficits. Extremities: Symmetric 5 x 5 power. Skin: No rashes, lesions or ulcers Psychiatry:  Mood & affect appropriate.    Data Reviewed:  I have personally reviewed following labs and imaging studies   CBC Lab Results  Component Value Date   WBC 53.9 (HH) 09/04/2022   RBC 2.55 (L) 09/04/2022   HGB 8.0 (L) 09/04/2022   HCT 24.7 (L) 09/04/2022   MCV 96.9 09/04/2022   MCH 31.4 09/04/2022   PLT 250 09/04/2022   MCHC 32.4 09/04/2022   RDW 13.5 09/04/2022   LYMPHSABS 34.9 (H) 09/04/2022   MONOABS 2.2 (H) 09/04/2022   EOSABS 0.1 09/04/2022   BASOSABS 0.1 65/68/1275     Last metabolic panel Lab Results  Component Value Date   NA 130 (L) 09/04/2022   K 3.7 09/04/2022   CL 105 09/04/2022   CO2 18 (L) 09/04/2022   BUN 10 09/04/2022   CREATININE 0.71 09/04/2022   GLUCOSE 100 (H) 09/04/2022   GFRNONAA >60 09/04/2022   GFRAA 39 (L) 10/10/2020   CALCIUM 8.5 (L) 09/04/2022   PROT 5.8 (L) 08/06/2022   ALBUMIN 4.2 08/06/2022   LABGLOB 2.4 10/10/2020   AGRATIO 1.6 10/10/2020   BILITOT 0.3 08/06/2022   ALKPHOS 69 08/06/2022   AST 18 08/06/2022   ALT 15 08/06/2022   ANIONGAP 7 09/04/2022    CBG (last 3)  No results for input(s): "GLUCAP" in the last 72 hours.    Coagulation Profile: No results for input(s): "INR", "PROTIME" in the last 168 hours.   Radiology Studies: DG Chest  Port 1 View  Result Date: 09/03/2022 CLINICAL DATA:  Cough and shortness of breath EXAM: PORTABLE CHEST 1 VIEW COMPARISON:  10/10/2020 FINDINGS: Normal  cardiomediastinal silhouette. Aortic atherosclerotic calcification. Hazy airspace and interstitial opacities in the right lower lung. Mild diffuse interstitial coarsening. Low lung volumes. Trace right pleural effusion. No pneumothorax. Thoracolumbar fusion hardware. Surgical clips right axilla. IMPRESSION: Right lower lung pneumonia. Electronically Signed   By: Placido Sou M.D.   On: 09/03/2022 01:51       Hosie Poisson M.D. [facility] 09/04/2022, 2:31 PM  Available via Epic secure chat 7am-7pm After 7 pm, please refer to night coverage provider listed on amion.

## 2022-09-04 NOTE — Evaluation (Signed)
Occupational Therapy Evaluation Patient Details Name: [name] [name] MRN: [id number] DOB: Mar 07, 1946 Today's Date: 09/04/2022   History of Present Illness [name] [name] is a 77 y.o. female with medical history significant of CLL, hypothyroidism, significant back surgeries/fusion.Marland Kitchen     Pt presents to ED 09/02/22  with c/o SOB and cough, weakness , and confused, positive for RSV, and  significant hyponatremia and Community acquired pneumonia of right lower lobe of lung   Clinical Impression      The pt is presenting slightly below her baseline level of functioning for self-care management, given deconditioning, mild generalized weakness, and feelings of shortness of breath with progressive activity. She required supervision to min guard assist for tasks such as lower body dressing, sit to stand, and toileting at bathroom level. She will benefit from further OT services to maximize her independence with self-care tasks and to facilitate her safe return home.    Recommendations for follow up therapy are one component of a multi-disciplinary discharge planning process, led by the attending physician.  Recommendations may be updated based on patient status, additional functional criteria and insurance authorization.   Follow Up Recommendations  No OT follow up     Assistance Recommended at Discharge PRN  Patient can return home with the following Assistance with cooking/housework;Assist for transportation;Help with stairs or ramp for entrance    Functional Status Assessment  Patient has had a recent decline in their functional status and demonstrates the ability to make significant improvements in function in a reasonable and predictable amount of time.  Equipment Recommendations  None recommended by OT       Precautions / Restrictions Restrictions Weight Bearing Restrictions: No Other Position/Activity Restrictions: droplet/comtact      Mobility Bed Mobility Overal bed mobility:  Modified Independent       Transfers Overall transfer level: Needs assistance Equipment used: None Transfers: Sit to/from Stand Sit to Stand: Min guard                  Balance Overall balance assessment: Mild deficits observed, not formally tested         ADL either performed or assessed with clinical judgement   ADL Overall ADL's : Needs assistance/impaired Eating/Feeding: Independent;Sitting   Grooming: Set up;Sitting           Upper Body Dressing : Set up;Sitting   Lower Body Dressing: Supervision/safety Lower Body Dressing Details (indicate cue type and reason): She doffed then donned her socks seated at chair level. Toilet Transfer: Min guard;Ambulation;Rolling walker (2 wheels)                   Vision   Additional Comments: She correctly read the time depicted on the wall clock.               Hand Dominance Right   Extremity/Trunk Assessment Upper Extremity Assessment Upper Extremity Assessment: Overall WFL for tasks assessed   Lower Extremity Assessment Lower Extremity Assessment: Overall WFL for tasks assessed       Communication Communication Communication: No difficulties   Cognition Arousal/Alertness: Awake/alert Behavior During Therapy: WFL for tasks assessed/performed Overall Cognitive Status: Within Functional Limits for tasks assessed            General Comments               Home Living Family/patient expects to be discharged to:: Private residence Living Arrangements: Spouse/significant other   Type of Home: House Home Access: Stairs to enter [employer]  of Steps: 2   Home Layout: Two level;Able to live on main level with bedroom/bathroom Alternate Level Stairs-Number of Steps: she has a basement and main level, however she does not have to access the basement   Bathroom Shower/Tub: Walk-in shower         Home Equipment: Conservation officer, nature (2 wheels);Cane - single point;Shower seat           Prior Functioning/Environment Prior Level of Function : Independent/Modified Independent             Mobility Comments: She was independent with ambulation. ADLs Comments: She was independent with ADLs, driving, and light cooking & cleaning.        OT Problem List: Decreased strength;Decreased activity tolerance;Impaired balance (sitting and/or standing);Decreased knowledge of use of DME or AE      OT Treatment/Interventions: Self-care/ADL training;Therapeutic exercise;Therapeutic activities;Energy conservation;DME and/or AE instruction;Patient/family education;Balance training    OT Goals(Current goals can be found in the care plan section) Acute Rehab OT Goals Patient Stated Goal: to get better OT Goal Formulation: With patient Time For Goal Achievement: 09/18/22 Potential to Achieve Goals: Good ADL Goals Pt Will Perform Grooming: with modified independence;standing Pt Will Perform Lower Body Dressing: with modified independence;sit to/from stand Pt Will Transfer to Toilet: with modified independence;ambulating Pt Will Perform Toileting - Clothing Manipulation and hygiene: with modified independence;sit to/from stand Pt/caregiver will Perform Home Exercise Program: Increased strength;Both right and left upper extremity;With theraband  OT Frequency: Min 2X/week       AM-PAC OT "6 Clicks" Daily Activity     Outcome Measure Help from another person eating meals?: None Help from another person taking care of personal grooming?: A Little Help from another person toileting, which includes using toliet, bedpan, or urinal?: A Little Help from another person bathing (including washing, rinsing, drying)?: A Little Help from another person to put on and taking off regular upper body clothing?: None Help from another person to put on and taking off regular lower body clothing?: None 6 Click Score: 21   End of Session Equipment Utilized During Treatment: Rolling walker (2  wheels) Nurse Communication: Mobility status  Activity Tolerance: Patient tolerated treatment well Patient left: in chair;with call bell/phone within reach;with family/visitor present  OT Visit Diagnosis: Muscle weakness (generalized) (M62.81)                Time: 1607-3710 OT Time Calculation (min): 18 min Charges:  OT General Charges $OT Visit: 1 Visit OT Evaluation $OT Eval Low Complexity: 1 Low    Katiya Fike L Mallerie Blok, OTR/L 09/04/2022, 5:20 PM

## 2022-09-04 NOTE — Progress Notes (Signed)
Pharmacy Note: Oral chemotherapy TPA  Per Dr Lorenso Courier: hold PTA Acalbrutinib, plan resume after discharge  Minda Ditto PharmD 09/04/2022, 2:39 PM

## 2022-09-05 ENCOUNTER — Ambulatory Visit: Payer: Medicare Other | Admitting: Pulmonary Disease

## 2022-09-05 DIAGNOSIS — J189 Pneumonia, unspecified organism: Secondary | ICD-10-CM | POA: Diagnosis not present

## 2022-09-05 DIAGNOSIS — E871 Hypo-osmolality and hyponatremia: Secondary | ICD-10-CM | POA: Diagnosis not present

## 2022-09-05 DIAGNOSIS — B338 Other specified viral diseases: Secondary | ICD-10-CM | POA: Diagnosis not present

## 2022-09-05 DIAGNOSIS — G9340 Encephalopathy, unspecified: Secondary | ICD-10-CM | POA: Diagnosis not present

## 2022-09-05 LAB — BASIC METABOLIC PANEL
Anion gap: 9 (ref 5–15)
Anion gap: 10 (ref 5–15)
BUN: 12 mg/dL (ref 8–23)
BUN: 15 mg/dL (ref 8–23)
CO2: 17 mmol/L — ABNORMAL LOW (ref 22–32)
CO2: 17 mmol/L — ABNORMAL LOW (ref 22–32)
Calcium: 8.4 mg/dL — ABNORMAL LOW (ref 8.9–10.3)
Calcium: 8.5 mg/dL — ABNORMAL LOW (ref 8.9–10.3)
Chloride: 103 mmol/L (ref 98–111)
Chloride: 101 mmol/L (ref 98–111)
Creatinine, Ser: 0.63 mg/dL (ref 0.44–1.00)
Creatinine, Ser: 0.71 mg/dL (ref 0.44–1.00)
GFR, Estimated: >60 mL/min (ref >60)
GFR, Estimated: >60 mL/min (ref >60)
Glucose, Bld: 163 mg/dL — ABNORMAL HIGH (ref 70–99)
Glucose, Bld: 118 mg/dL — ABNORMAL HIGH (ref 70–99)
Potassium: 4.2 mmol/L (ref 3.5–5.1)
Potassium: 3.2 mmol/L — ABNORMAL LOW (ref 3.5–5.1)
Sodium: 129 mmol/L — ABNORMAL LOW (ref 135–145)
Sodium: 128 mmol/L — ABNORMAL LOW (ref 135–145)

## 2022-09-05 NOTE — Progress Notes (Signed)
Patient's daughter called to cancel PAT appointment on Monday. Patient is currently admitted in the hospital for pneumonia.

## 2022-09-05 NOTE — Care Management Important Message (Signed)
Important Message  Patient Details  Name: [name] [name] MRN: [id number] Date of Birth: June 30, 1946   Medicare Important Message Given:  Yes     Memory Argue 09/05/2022, 10:53 AM

## 2022-09-05 NOTE — Progress Notes (Signed)
[facility]                                                                               [name], is a 77 y.o. female, DOB - 1945-11-14, VHQ:[id number] Admit date - 09/02/2022    Outpatient Primary MD for the patient is Deland Pretty, MD  LOS - 2  days    Brief summary    [name] [name] is a 77 y.o. female with medical history significant of CLL, hypothyroidism.  presents to ED with c/o SOB and cough for about 1 week.  She was admitted for CAP, started on IV antibiotics. . Continue the same.  She was also found to have hyponatremia.     Assessment & Plan    Assessment and Plan:   Acute metabolic encephalopathy probably from hyponatremia .  Which appears to have resolved.  She is alert and oriented and answering questions appropriately.    CLL  Chemo on hold as per Dr Lorenso Courier   Anemia of chronic disease:  - hemoglobin is stable at 8.    Hyporosmolar Hyponatremia:  Suspect from cymbalta and dehydration. Also a component of SIADH.  Sodium has improved to 130 and down to 129 this morning.  Serum osmo is 258.  Urine sodium is 26 and urine osmo is 345 TSH wnl and am cortisol level wnl.      RLL pneumonia.  Started her on IV antibiotics.  [place] oxygen to keep sats greater thatn 90%. Sputum cultures ordered.  SLP eval will be ordered.  Strep pneumonia is negative.  Check ambulating oxygen levels today.     RSV bronchitis:  Started on IV solumedrol 40 mg daily. No wheezing today, transition to oral steroids today.  Continue with bronchodilators.    Hyperlipidemia:  Resume crestor.     GERD  Resume PPI.       RN Pressure Injury Documentation:   Estimated body mass index is 27.88 kg/m as calculated from the following:   Height as of this encounter: '5\' 3"'$  (1.6 m).   Weight as of this encounter: 71.4 kg.  Code Status: full code.  DVT Prophylaxis:  enoxaparin (LOVENOX) injection 40 mg Start: 09/03/22 1000   Level of Care: Level  of care: Med-Surg Family Communication: none at bedside.   Disposition Plan:     Remains inpatient appropriate:  for IV antibiotics.    Procedures:  None.   Consultants:   None.   Antimicrobials:   Anti-infectives (From admission, onward)    Start     Dose/Rate Route Frequency Ordered Stop   09/04/22 0200  cefTRIAXone (ROCEPHIN) 2 g in sodium chloride 0.9 % 100 mL IVPB        2 g 200 mL/hr over 30 Minutes Intravenous Every 24 hours 09/03/22 0511 09/09/22 0159   09/04/22 0200  azithromycin (ZITHROMAX) 500 mg in sodium chloride 0.9 % 250 mL IVPB        500 mg 250 mL/hr over 60 Minutes Intravenous Every 24 hours 09/03/22 0511 09/09/22 0159   09/03/22 0200  cefTRIAXone (ROCEPHIN) 1 g in sodium chloride 0.9 % 100 mL IVPB        1 g  200 mL/hr over 30 Minutes Intravenous  Once 09/03/22 0156 09/03/22 0257   09/03/22 0200  azithromycin (ZITHROMAX) 500 mg in sodium chloride 0.9 % 250 mL IVPB        500 mg 250 mL/hr over 60 Minutes Intravenous  Once 09/03/22 0156 09/03/22 0357        Medications  Scheduled Meds:  benzonatate  200 mg Oral TID   brexpiprazole  1 mg Oral Daily   buPROPion  300 mg Oral Daily   cyanocobalamin  1,000 mcg Oral Daily   enoxaparin (LOVENOX) injection  40 mg Subcutaneous Q24H   ferrous sulfate  325 mg Oral Q breakfast   gabapentin  100 mg Oral QHS   levothyroxine  88 mcg Oral Q0600   methylPREDNISolone (SOLU-MEDROL) injection  40 mg Intravenous Q24H   pantoprazole  40 mg Oral Daily   rosuvastatin  5 mg Oral Daily   Continuous Infusions:  azithromycin 500 mg (09/05/22 0109)   cefTRIAXone (ROCEPHIN)  IV 2 g (09/05/22 0221)   PRN Meds:.guaiFENesin, ipratropium-albuterol, mouth rinse, phenol    Subjective:   [name] [name] was seen and examined today.   Breathing better, but does not feel well enough to go home.   Objective:   Vitals:   09/04/22 1255 09/04/22 2231 09/05/22 0559 09/05/22 1452  BP: (!) 118/55 125/60 (!) 116/54 (!) 123/56  Pulse:  89 80 77 86  Resp: '18 17 15 16  '$ Temp: 98.5 F (36.9 C) 98.5 F (36.9 C) 97.8 F (36.6 C) 98.3 F (36.8 C)  TempSrc: Oral Oral  Oral  SpO2: 99% 96% 98% 93%  Weight:      Height:        Intake/Output Summary (Last 24 hours) at 09/05/2022 1712 Last data filed at 09/05/2022 1500 Gross per 24 hour  Intake 507.78 ml  Output 1000 ml  Net -492.22 ml    Filed Weights   09/03/22 0016  Weight: 71.4 kg     Exam General exam: Appears calm and comfortable  Respiratory system: Clear to auscultation. Respiratory effort normal. Cardiovascular system: S1 & S2 heard, RRR. No JVD,  No pedal edema. Gastrointestinal system: Abdomen is nondistended, soft and nontender. Central nervous system: Alert and oriented. No focal neurological deficits. Extremities: Symmetric 5 x 5 power. Skin: No rashes, lesions or ulcers Psychiatry:  Mood & affect appropriate.     Data Reviewed:  I have personally reviewed following labs and imaging studies   CBC Lab Results  Component Value Date   WBC 53.9 (HH) 09/04/2022   RBC 2.55 (L) 09/04/2022   HGB 8.0 (L) 09/04/2022   HCT 24.7 (L) 09/04/2022   MCV 96.9 09/04/2022   MCH 31.4 09/04/2022   PLT 250 09/04/2022   MCHC 32.4 09/04/2022   RDW 13.5 09/04/2022   LYMPHSABS 34.9 (H) 09/04/2022   MONOABS 2.2 (H) 09/04/2022   EOSABS 0.1 09/04/2022   BASOSABS 0.1 30/16/0109     Last metabolic panel Lab Results  Component Value Date   NA 129 (L) 09/05/2022   K 4.2 09/05/2022   CL 103 09/05/2022   CO2 17 (L) 09/05/2022   BUN 12 09/05/2022   CREATININE 0.63 09/05/2022   GLUCOSE 163 (H) 09/05/2022   GFRNONAA >60 09/05/2022   GFRAA 39 (L) 10/10/2020   CALCIUM 8.4 (L) 09/05/2022   PROT 5.8 (L) 08/06/2022   ALBUMIN 4.2 08/06/2022   LABGLOB 2.4 10/10/2020   AGRATIO 1.6 10/10/2020   BILITOT 0.3 08/06/2022   ALKPHOS 69 08/06/2022  AST 18 08/06/2022   ALT 15 08/06/2022   ANIONGAP 9 09/05/2022    CBG (last 3)  No results for input(s): "GLUCAP" in the  last 72 hours.    Coagulation Profile: No results for input(s): "INR", "PROTIME" in the last 168 hours.   Radiology Studies: No results found.     Hosie Poisson M.D. [facility] 09/05/2022, 5:12 PM  Available via Epic secure chat 7am-7pm After 7 pm, please refer to night coverage provider listed on amion.

## 2022-09-05 NOTE — Progress Notes (Signed)
Mobility Specialist - Progress Note  Pre-mobility: 98% SpO2 During mobility: 92% SpO2 Post-mobility: 99% SPO2   09/05/22 1020  Oxygen Therapy  O2 Device Room Air  O2 Flow Rate (L/min) 2 L/min  Patient Activity (if Appropriate) Ambulating  Mobility  Activity Ambulated independently in hallway  Level of Assistance Modified independent, requires aide device or extra time  Assistive Device None  Distance Ambulated (ft) 120 ft  Range of Motion/Exercises Active  Activity Response Tolerated well  Mobility Referral Yes  $Mobility charge 1 Mobility   Pt was found in bed and agreeable to ambulate after going to the bathroom. Pt ambulated ~59f and afterwards stated that she wanted to turn back due to feeling SOB. At EOS returned to bed with all necessities in reach and bed alarm on.   BFerd HibbsMobility Specialist

## 2022-09-05 NOTE — Plan of Care (Signed)
  Problem: Activity: Goal: Risk for activity intolerance will decrease Outcome: Progressing   Problem: Safety: Goal: Ability to remain free from injury will improve Outcome: Progressing   Problem: Activity: Goal: Ability to tolerate increased activity will improve Outcome: Progressing

## 2022-09-06 DIAGNOSIS — G9340 Encephalopathy, unspecified: Secondary | ICD-10-CM | POA: Diagnosis not present

## 2022-09-06 DIAGNOSIS — J189 Pneumonia, unspecified organism: Secondary | ICD-10-CM | POA: Diagnosis not present

## 2022-09-06 DIAGNOSIS — B338 Other specified viral diseases: Secondary | ICD-10-CM | POA: Diagnosis not present

## 2022-09-06 DIAGNOSIS — E871 Hypo-osmolality and hyponatremia: Secondary | ICD-10-CM | POA: Diagnosis not present

## 2022-09-06 LAB — BASIC METABOLIC PANEL
Anion gap: 13 (ref 5–15)
BUN: 17 mg/dL (ref 8–23)
CO2: 17 mmol/L — ABNORMAL LOW (ref 22–32)
Calcium: 8.3 mg/dL — ABNORMAL LOW (ref 8.9–10.3)
Chloride: 102 mmol/L (ref 98–111)
Creatinine, Ser: 0.73 mg/dL (ref 0.44–1.00)
GFR, Estimated: >60 mL/min (ref >60)
Glucose, Bld: 117 mg/dL — ABNORMAL HIGH (ref 70–99)
Potassium: 3.9 mmol/L (ref 3.5–5.1)
Sodium: 132 mmol/L — ABNORMAL LOW (ref 135–145)

## 2022-09-06 LAB — CBC WITH DIFFERENTIAL/PLATELET
Abs Immature Granulocytes: 1.2 10*3/uL — ABNORMAL HIGH (ref 0.00–0.07)
Basophils Absolute: 0 10*3/uL (ref 0.0–0.1)
Basophils Relative: 0 %
Eosinophils Absolute: 0 10*3/uL (ref 0.0–0.5)
Eosinophils Relative: 0 %
HCT: 26.7 % — ABNORMAL LOW (ref 36.0–46.0)
Hemoglobin: 8 g/dL — ABNORMAL LOW (ref 12.0–15.0)
Immature Granulocytes: 2 %
Lymphocytes Relative: 71 %
Lymphs Abs: 49.6 10*3/uL — ABNORMAL HIGH (ref 0.7–4.0)
MCH: 30.3 pg (ref 26.0–34.0)
MCHC: 30 g/dL (ref 30.0–36.0)
MCV: 101.1 fL — ABNORMAL HIGH (ref 80.0–100.0)
Monocytes Absolute: 1 10*3/uL (ref 0.1–1.0)
Monocytes Relative: 1 %
Neutro Abs: 17.9 10*3/uL — ABNORMAL HIGH (ref 1.7–7.7)
Neutrophils Relative %: 26 %
Platelets: 349 10*3/uL (ref 150–400)
RBC: 2.64 MIL/uL — ABNORMAL LOW (ref 3.87–5.11)
RDW: 13.9 % (ref 11.5–15.5)
WBC: 69.7 10*3/uL (ref 4.0–10.5)
nRBC: 0 % (ref 0.0–0.2)

## 2022-09-06 MED ORDER — PREDNISONE 10 MG PO TABS: 40.0000 mg | ORAL_TABLET | Freq: Every day | ORAL | 0 refills | Status: AC

## 2022-09-06 MED ORDER — AZITHROMYCIN 250 MG PO TABS: 250.0000 mg | ORAL_TABLET | ORAL | 0 refills | Status: AC

## 2022-09-06 NOTE — Plan of Care (Signed)
  Problem: Activity: Goal: Risk for activity intolerance will decrease Outcome: Progressing   Problem: Activity: Goal: Ability to tolerate increased activity will improve Outcome: Progressing

## 2022-09-06 NOTE — Progress Notes (Signed)
Pt alert and oriented. Discharge instructions discussed with the pt. No queries or questions regarding discharge instructions. Belongings sent home with pt.

## 2022-09-07 LAB — CULTURE, RESPIRATORY W GRAM STAIN

## 2022-09-07 NOTE — Discharge Summary (Signed)
Physician Discharge Summary   Patient: [name] [name] MRN: [id number] DOB: [birth year]  Admit date:     09/02/2022  Discharge date: 09/06/2022  Discharge Physician: Hosie Poisson   PCP: Deland Pretty, MD   Recommendations at discharge:  Please follow up with PCP in one week.  We have stopped the Cymbalta and  amitriptyline due to severe hyponatremia.  Please check BMP in one week and restart meds as per PCP recommendations.   Discharge Diagnoses: Principal Problem:   CAP (community acquired pneumonia) Active Problems:   Hyponatremia   Acute encephalopathy   RSV infection   RSV (acute bronchiolitis due to respiratory syncytial virus)    Hospital Course:   EMIAH PELLICANO is a 77 y.o. female with medical history significant of CLL, hypothyroidism.  presents to ED with c/o SOB and cough for about 1 week.  She was admitted for CAP, started on IV antibiotics. . Continue the same.  She was also found to have hyponatremia.    Assessment and Plan:  Acute metabolic encephalopathy probably from hyponatremia .  Which appears to have resolved.  She is alert and oriented and answering questions appropriately.      CLL  Chemo on hold as per Dr Lorenso Courier     Anemia of chronic disease:  - hemoglobin is stable at 8.      Hyporosmolar Hyponatremia:  Suspect from Cymbalta, amitriptyline and dehydration. Also a component of SIADH.  Sodium has improved to 132 after stopping the meds.  Serum osmo is 258.  Urine sodium is 26 and urine osmo is 345 TSH wnl and am cortisol level wnl.          RLL pneumonia.  Started her on IV antibiotics.  Transition to oral antibiotics on discharge. [place] oxygen to keep sats greater thatn 90%. Sputum cultures ordered.  SLP eval will be ordered.  Strep pneumonia is negative.         RSV bronchitis:  Started on IV solumedrol 40 mg daily. No wheezing today, transition to oral steroids on discharge.  Continue with bronchodilators.       Hyperlipidemia:  Resume crestor.        GERD  Resume PPI.            Consultants: none.  Procedures performed: none.   Disposition: Home Diet recommendation:  Discharge Diet Orders (From admission, onward)     Start     Ordered   09/06/22 0000  Diet - low sodium heart healthy        09/06/22 1330           Regular diet DISCHARGE MEDICATION: Allergies as of 09/06/2022       Reactions   Lasix [furosemide] Nausea And Vomiting, Other (See Comments)   HEADACHE   Lithium Other (See Comments)   Dizziness, "cause me to fall"   Sulfa Antibiotics Hives   Trazodone And Nefazodone Other (See Comments)   insomnia   Lyrica [pregabalin] Diarrhea, Nausea Only, Rash        Medication List     STOP taking these medications    amitriptyline 100 MG tablet Commonly known as: ELAVIL   Calquence 100 MG tablet Generic drug: acalabrutinib maleate   DULoxetine 60 MG capsule Commonly known as: CYMBALTA   topiramate 25 MG tablet Commonly known as: Topamax       TAKE these medications    allopurinol 300 MG tablet Commonly known as: ZYLOPRIM TAKE 1 TABLET(300 MG) BY MOUTH DAILY What changed: See  the new instructions.   aspirin EC 81 MG tablet Take 1 tablet by mouth daily.   azithromycin 250 MG tablet Commonly known as: ZITHROMAX Take 1 tablet (250 mg total) by mouth as directed for 2 days.   benzonatate 200 MG capsule Commonly known as: TESSALON Take 200 mg by mouth 3 (three) times daily.   brexpiprazole 1 MG Tabs tablet Commonly known as: REXULTI Take 1 mg by mouth daily.   buPROPion 300 MG 24 hr tablet Commonly known as: WELLBUTRIN XL Take 300 mg by mouth daily.   CALCIUM CITRATE PO Take 1 tablet by mouth daily.   diclofenac Sodium 1 % Gel Commonly known as: Voltaren Apply 2 g topically 4 (four) times daily. What changed:  when to take this reasons to take this   ferrous sulfate 325 (65 FE) MG tablet Take 325 mg by mouth daily with  breakfast.   gabapentin 100 MG capsule Commonly known as: NEURONTIN Take 100 mg by mouth at bedtime.   guaiFENesin 600 MG 12 hr tablet Commonly known as: MUCINEX Take 600 mg by mouth 2 (two) times daily as needed for to loosen phlegm.   levothyroxine 88 MCG tablet Commonly known as: SYNTHROID Take 88 mcg by mouth daily before breakfast.   omeprazole 40 MG capsule Commonly known as: PRILOSEC TAKE 1 CAPSULE(40 MG) BY MOUTH DAILY AFTER BREAKFAST   predniSONE 10 MG tablet Commonly known as: DELTASONE Take 4 tablets (40 mg total) by mouth daily for 4 days.   PREVAGEN PO Take 1 tablet by mouth daily.   rosuvastatin 5 MG tablet Commonly known as: CRESTOR TAKE 1 TABLET(5 MG) BY MOUTH DAILY What changed: See the new instructions.   UNABLE TO FIND Take 1 tablet by mouth 3 (three) times daily. Med Name: Metatrim   Vitamin B12 1000 MCG Tbcr Take 1 tablet by mouth daily.   Vitamin D3 50 MCG (2000 UT) Tabs Take 1 tablet by mouth daily.   Womens 50+ Multi Vitamin/Min Tabs Take 1 tablet by mouth daily.   zinc gluconate 50 MG tablet Take 50 mg by mouth daily.        Discharge Exam: Filed Weights   09/03/22 0016  Weight: 71.4 kg   General exam: Appears calm and comfortable  Respiratory system: Clear to auscultation. Respiratory effort normal. Cardiovascular system: S1 & S2 heard, RRR. No JVD, No pedal edema. Gastrointestinal system: Abdomen is nondistended, soft and nontender . Central nervous system: Alert and oriented. No focal neurological deficits. Extremities: Symmetric 5 x 5 power. Skin: No rashes,  Psychiatry:  Mood & affect appropriate.    Condition at discharge: fair  The results of significant diagnostics from this hospitalization (including imaging, microbiology, ancillary and laboratory) are listed below for reference.   Imaging Studies: DG Chest Port 1 View  Result Date: 09/03/2022 CLINICAL DATA:  Cough and shortness of breath EXAM: PORTABLE CHEST 1  VIEW COMPARISON:  10/10/2020 FINDINGS: Normal cardiomediastinal silhouette. Aortic atherosclerotic calcification. Hazy airspace and interstitial opacities in the right lower lung. Mild diffuse interstitial coarsening. Low lung volumes. Trace right pleural effusion. No pneumothorax. Thoracolumbar fusion hardware. Surgical clips right axilla. IMPRESSION: Right lower lung pneumonia. Electronically Signed   By: Placido Sou M.D.   On: 09/03/2022 01:51    Microbiology: Results for orders placed or performed during the hospital encounter of 09/02/22  Resp panel by RT-PCR (RSV, Flu A&B, Covid) Anterior Nasal Swab     Status: Abnormal   Collection Time: 09/03/22 12:18 AM   Specimen:  Anterior Nasal Swab  Result Value Ref Range Status   SARS Coronavirus 2 by RT PCR NEGATIVE NEGATIVE Final    Comment: (NOTE) SARS-CoV-2 target nucleic acids are NOT DETECTED.  The SARS-CoV-2 RNA is generally detectable in upper respiratory specimens during the acute phase of infection. The lowest concentration of SARS-CoV-2 viral copies this assay can detect is 138 copies/mL. A negative result does not preclude SARS-Cov-2 infection and should not be used as the sole basis for treatment or other patient management decisions. A negative result may occur with  improper specimen collection/handling, submission of specimen other than nasopharyngeal swab, presence of viral mutation(s) within the areas targeted by this assay, and inadequate number of viral copies(<138 copies/mL). A negative result must be combined with clinical observations, patient history, and epidemiological information. The expected result is Negative.  Fact Sheet for Patients:  EntrepreneurPulse.com.au  Fact Sheet for Healthcare Providers:  IncredibleEmployment.be  This test is no t yet approved or cleared by the [place] FDA and  has been authorized for detection and/or diagnosis of SARS-CoV-2 by FDA  under an Emergency Use Authorization (EUA). This EUA will remain  in effect (meaning this test can be used) for the duration of the COVID-19 declaration under Section 564(b)(1) of the Act, 21 U.S.C.section 360bbb-3(b)(1), unless the authorization is terminated  or revoked sooner.       Influenza A by PCR NEGATIVE NEGATIVE Final   Influenza B by PCR NEGATIVE NEGATIVE Final    Comment: (NOTE) The Xpert Xpress SARS-CoV-2/FLU/RSV plus assay is intended as an aid in the diagnosis of influenza from Nasopharyngeal swab specimens and should not be used as a sole basis for treatment. Nasal washings and aspirates are unacceptable for Xpert Xpress SARS-CoV-2/FLU/RSV testing.  Fact Sheet for Patients: EntrepreneurPulse.com.au  Fact Sheet for Healthcare Providers: IncredibleEmployment.be  This test is not yet approved or cleared by the [place] FDA and has been authorized for detection and/or diagnosis of SARS-CoV-2 by FDA under an Emergency Use Authorization (EUA). This EUA will remain in effect (meaning this test can be used) for the duration of the COVID-19 declaration under Section 564(b)(1) of the Act, 21 U.S.C. section 360bbb-3(b)(1), unless the authorization is terminated or revoked.     Resp Syncytial Virus by PCR POSITIVE (A) NEGATIVE Final    Comment: (NOTE) Fact Sheet for Patients: EntrepreneurPulse.com.au  Fact Sheet for Healthcare Providers: IncredibleEmployment.be  This test is not yet approved or cleared by the [place] FDA and has been authorized for detection and/or diagnosis of SARS-CoV-2 by FDA under an Emergency Use Authorization (EUA). This EUA will remain in effect (meaning this test can be used) for the duration of the COVID-19 declaration under Section 564(b)(1) of the Act, 21 U.S.C. section 360bbb-3(b)(1), unless the authorization is terminated or revoked.  Performed at [facility], [place] [address], [place], [place] [address]   Blood culture (routine x 2)     Status: None (Preliminary result)   Collection Time: 09/03/22 12:58 AM   Specimen: BLOOD  Result Value Ref Range Status   Specimen Description   Final    BLOOD BLOOD LEFT ARM Performed at [facility] [address] [place], [place] [address]    Special Requests   Final    BOTTLES DRAWN AEROBIC AND ANAEROBIC Blood Culture adequate volume Performed at [facility] [address], [place],  [address]    Culture   Final    NO GROWTH 4 DAYS Performed at  [facility], [place] [address], [place], [place] [address]    Report Status PENDING  Incomplete  Blood culture (routine x 2)     Status: None (Preliminary result)   Collection Time: 09/03/22 12:58 AM   Specimen: BLOOD  Result Value Ref Range Status   Specimen Description   Final    BLOOD BLOOD LEFT ARM Performed at [facility] [address], [place], [place] [address]    Special Requests   Final    BOTTLES DRAWN AEROBIC AND ANAEROBIC Blood Culture results may not be optimal due to an inadequate volume of blood received in culture bottles Performed at [facility] [address] [place], [place] [address]    Culture   Final    NO GROWTH 4 DAYS Performed at [facility], [place] [address], [place], [place] [address]    Report Status PENDING  Incomplete  Expectorated Sputum Assessment w Gram Stain, Rflx to Resp Cult     Status: None   Collection Time: 09/04/22  3:37 PM   Specimen: Expectorated Sputum  Result Value Ref Range Status   Specimen Description EXPECTORATED SPUTUM  Final   Special Requests NONE  Final   Sputum evaluation   Final    THIS SPECIMEN IS ACCEPTABLE FOR SPUTUM CULTURE Performed at [facility], [place] [address] [place], [place] [address]    Report Status 09/04/2022 FINAL  Final  Culture,  Respiratory w Gram Stain     Status: None (Preliminary result)   Collection Time: 09/04/22  3:37 PM  Result Value Ref Range Status   Specimen Description   Final    EXPECTORATED SPUTUM Performed at [facility] [address], [place], [place] [address]    Special Requests   Final    NONE Reflexed from [id number] Performed at [facility], [place] [address], [place], [name] [address]    Gram Stain   Final    MODERATE WBC PRESENT, PREDOMINANTLY PMN RARE GRAM POSITIVE COCCI RARE GRAM NEGATIVE RODS RARE GRAM POSITIVE RODS    Culture   Final    CULTURE REINCUBATED FOR BETTER GROWTH Performed at [facility], [place] [address], [place], [place] [address]    Report Status PENDING  Incomplete   *Note: Due to a large number of results and/or encounters for the requested time period, some results have not been displayed. A complete set of results can be found in Results Review.    Labs: CBC: Recent Labs  Lab 09/03/22 0058 09/03/22 0530 09/04/22 0531 09/06/22 0516  WBC 60.3* 53.3* 53.9* 69.7*  NEUTROABS 16.5*  --  16.1* 17.9*  HGB 8.9* 8.1* 8.0* 8.0*  HCT 27.8* 25.8* 24.7* 26.7*  MCV 94.9 95.2 96.9 101.1*  PLT 264 243 250 384   Basic Metabolic Panel: Recent Labs  Lab 09/04/22 0531 09/04/22 1654 09/05/22 0506 09/05/22 1655 09/06/22 0516  NA 130* 128* 129* 128* 132*  K 3.7 3.7 4.2 3.2* 3.9  CL 105 102 103 101 102  CO2 18* 15* 17* 17* 17*  GLUCOSE 100* 106* 163* 118* 117*  BUN '10 9 12 15 17  '$ CREATININE 0.71 0.73 0.63 0.71 0.73  CALCIUM 8.5* 8.1* 8.4* 8.5* 8.3*   Liver Function Tests: No results for input(s): "AST", "ALT", "ALKPHOS", "BILITOT", "PROT", "ALBUMIN" in the last 168 hours. CBG: No results for input(s): "GLUCAP" in the last 168 hours.  Discharge time spent: 36 minutes.   Signed: Jeoffrey Massed  Karleen Hampshire, MD [facility]

## 2022-09-08 ENCOUNTER — Ambulatory Visit: Payer: Medicare Other | Admitting: Physical Medicine and Rehabilitation

## 2022-09-08 ENCOUNTER — Inpatient Hospital Stay (HOSPITAL_COMMUNITY)
Admission: RE | Admit: 2022-09-08 | Discharge: 2022-09-08 | Disposition: A | Discharge status: Home / Self Care | Payer: Medicare Other | Source: Ambulatory Visit

## 2022-09-08 LAB — CULTURE, BLOOD (ROUTINE X 2)
Culture: NO GROWTH
Culture: NO GROWTH
Special Requests: ADEQUATE

## 2022-09-10 ENCOUNTER — Ambulatory Visit: Payer: Medicare Other | Admitting: Hematology and Oncology

## 2022-09-10 ENCOUNTER — Other Ambulatory Visit: Payer: Medicare Other

## 2022-09-15 ENCOUNTER — Inpatient Hospital Stay: Admit: 2022-09-15 | Payer: Medicare Other | Admitting: Neurosurgery

## 2022-09-15 SURGERY — LAMINECTOMY WITH POSTERIOR LATERAL ARTHRODESIS LEVEL 3
Anesthesia: General

## 2022-09-16 ENCOUNTER — Telehealth: Payer: Self-pay

## 2022-09-16 NOTE — Telephone Encounter (Addendum)
Patient called to advise that she is stopping her Calquence because of the the "pounding in her head". She is requesting to try alternate medication for CLL. Her next appointment is on 09/23/22 with Dede Query PA.  Called patient to let her know that Dr. Lorenso Courier will speak with patient on 09/23/22 to review risks and benefits of new treatment.

## 2022-09-18 DIAGNOSIS — B338 Other specified viral diseases: Secondary | ICD-10-CM | POA: Diagnosis not present

## 2022-09-18 DIAGNOSIS — E039 Hypothyroidism, unspecified: Secondary | ICD-10-CM | POA: Diagnosis not present

## 2022-09-18 DIAGNOSIS — E871 Hypo-osmolality and hyponatremia: Secondary | ICD-10-CM | POA: Diagnosis not present

## 2022-09-18 DIAGNOSIS — R748 Abnormal levels of other serum enzymes: Secondary | ICD-10-CM | POA: Diagnosis not present

## 2022-09-18 DIAGNOSIS — E7800 Pure hypercholesterolemia, unspecified: Secondary | ICD-10-CM | POA: Diagnosis not present

## 2022-09-18 DIAGNOSIS — Z09 Encounter for follow-up examination after completed treatment for conditions other than malignant neoplasm: Secondary | ICD-10-CM | POA: Diagnosis not present

## 2022-09-19 ENCOUNTER — Other Ambulatory Visit (HOSPITAL_COMMUNITY): Payer: Self-pay

## 2022-09-22 ENCOUNTER — Other Ambulatory Visit: Payer: Self-pay | Admitting: Physician Assistant

## 2022-09-22 ENCOUNTER — Encounter: Payer: Self-pay | Admitting: Hematology and Oncology

## 2022-09-22 DIAGNOSIS — C9110 Chronic lymphocytic leukemia of B-cell type not having achieved remission: Secondary | ICD-10-CM

## 2022-09-23 ENCOUNTER — Inpatient Hospital Stay: Payer: Medicare Other | Attending: Hematology and Oncology

## 2022-09-23 ENCOUNTER — Other Ambulatory Visit: Payer: Self-pay | Admitting: Hematology and Oncology

## 2022-09-23 ENCOUNTER — Other Ambulatory Visit: Payer: Self-pay

## 2022-09-23 ENCOUNTER — Inpatient Hospital Stay (HOSPITAL_BASED_OUTPATIENT_CLINIC_OR_DEPARTMENT_OTHER): Payer: Medicare Other | Admitting: Physician Assistant

## 2022-09-23 ENCOUNTER — Other Ambulatory Visit (HOSPITAL_COMMUNITY): Payer: Self-pay | Admitting: Internal Medicine

## 2022-09-23 VITALS — BP 131/61 | HR 90 | Temp 98.1°F | Resp 16 | Wt 156.4 lb

## 2022-09-23 DIAGNOSIS — C9110 Chronic lymphocytic leukemia of B-cell type not having achieved remission: Secondary | ICD-10-CM | POA: Diagnosis not present

## 2022-09-23 DIAGNOSIS — Z853 Personal history of malignant neoplasm of breast: Secondary | ICD-10-CM | POA: Diagnosis not present

## 2022-09-23 DIAGNOSIS — R059 Cough, unspecified: Secondary | ICD-10-CM | POA: Diagnosis not present

## 2022-09-23 DIAGNOSIS — Z7982 Long term (current) use of aspirin: Secondary | ICD-10-CM | POA: Insufficient documentation

## 2022-09-23 DIAGNOSIS — Z9011 Acquired absence of right breast and nipple: Secondary | ICD-10-CM | POA: Diagnosis not present

## 2022-09-23 DIAGNOSIS — Z79899 Other long term (current) drug therapy: Secondary | ICD-10-CM | POA: Insufficient documentation

## 2022-09-23 DIAGNOSIS — R748 Abnormal levels of other serum enzymes: Secondary | ICD-10-CM

## 2022-09-23 LAB — CMP (CANCER CENTER ONLY)
ALT: 19 U/L (ref 0–44)
AST: 20 U/L (ref 15–41)
Albumin: 3.8 g/dL (ref 3.5–5.0)
Alkaline Phosphatase: 111 U/L (ref 38–126)
Anion gap: 6 (ref 5–15)
BUN: 9 mg/dL (ref 8–23)
CO2: 25 mmol/L (ref 22–32)
Calcium: 9.2 mg/dL (ref 8.9–10.3)
Chloride: 101 mmol/L (ref 98–111)
Creatinine: 0.81 mg/dL (ref 0.44–1.00)
GFR, Estimated: >60 mL/min (ref >60)
Glucose, Bld: 108 mg/dL — ABNORMAL HIGH (ref 70–99)
Potassium: 3.9 mmol/L (ref 3.5–5.1)
Sodium: 132 mmol/L — ABNORMAL LOW (ref 135–145)
Total Bilirubin: 0.3 mg/dL (ref 0.3–1.2)
Total Protein: 6.1 g/dL — ABNORMAL LOW (ref 6.5–8.1)

## 2022-09-23 LAB — CBC WITH DIFFERENTIAL (CANCER CENTER ONLY)
Abs Immature Granulocytes: 0.08 10*3/uL — ABNORMAL HIGH (ref 0.00–0.07)
Basophils Absolute: 0.1 10*3/uL (ref 0.0–0.1)
Basophils Relative: 0 %
Eosinophils Absolute: 0.8 10*3/uL — ABNORMAL HIGH (ref 0.0–0.5)
Eosinophils Relative: 3 %
HCT: 31.1 % — ABNORMAL LOW (ref 36.0–46.0)
Hemoglobin: 10.3 g/dL — ABNORMAL LOW (ref 12.0–15.0)
Immature Granulocytes: 0 %
Lymphocytes Relative: 72 %
Lymphs Abs: 22.8 10*3/uL — ABNORMAL HIGH (ref 0.7–4.0)
MCH: 32 pg (ref 26.0–34.0)
MCHC: 33.1 g/dL (ref 30.0–36.0)
MCV: 96.6 fL (ref 80.0–100.0)
Monocytes Absolute: 1.2 10*3/uL — ABNORMAL HIGH (ref 0.1–1.0)
Monocytes Relative: 4 %
Neutro Abs: 6.7 10*3/uL (ref 1.7–7.7)
Neutrophils Relative %: 21 %
Platelet Count: 201 10*3/uL (ref 150–400)
RBC: 3.22 MIL/uL — ABNORMAL LOW (ref 3.87–5.11)
RDW: 13.8 % (ref 11.5–15.5)
Smear Review: NORMAL
WBC Count: 31.7 10*3/uL — ABNORMAL HIGH (ref 4.0–10.5)
nRBC: 0 % (ref 0.0–0.2)

## 2022-09-23 LAB — LACTATE DEHYDROGENASE: LDH: 168 U/L (ref 98–192)

## 2022-09-23 MED ORDER — ZANUBRUTINIB 80 MG PO CAPS
160.0000 mg | ORAL_CAPSULE | Freq: Two times a day (BID) | ORAL | 2 refills | Status: DC
  Filled 2022-09-23 – 2022-09-24 (×2): qty 120, 30d supply, fill #0

## 2022-09-23 NOTE — Progress Notes (Signed)
[facility] [phone]   Fax:([phone]  PROGRESS NOTE  Patient Care Team: Deland Pretty, MD as PCP - General (Internal Medicine) Jerline Pain, MD as PCP - Cardiology (Cardiology) Milus Banister, MD as Attending Physician (Gastroenterology) Megan Salon, MD as Consulting Physician (Gynecology) Donnie Mesa, MD as Consulting Physician (General Surgery) Noemi Chapel, NP as Nurse Practitioner Rod Can, MD as Consulting Physician (Orthopedic Surgery) Latanya Maudlin, MD as Consulting Physician (Orthopedic Surgery) Ria Clock, MD as Attending Physician (Radiology) Adele Dan, MD as Referring Physician (Orthopedic Surgery) Orson Slick, MD as Consulting Physician (Hematology and Oncology)  Hematological/Oncological History # CLL Rai Stage 1  06/04/2021: last visit with Dr. Jana Hakim. Detailed history of his care history noted below.  09/18/2021: WBC 43.3, Hgb 13.0, MCV 87.1, Plt 275 09/23/2021: establish care with Dr. Lorenso Courier. WBC 30.1, Hgb 12.3, MCV 87.8, Plt 241 03/11/2022: WBC 57.4, Hgb 11.2, MCV 88.3, Plt 181  04/29/2022: Started acalabrutinib 100 mg PO twice daily   Interval History:  [name] [name] 77 y.o. 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 The patient's last visit was on 08/06/2022. In the interim since the last visit she was admitted for pneumonia and RSV infection.  On exam today [name] [name] is accompanied by her husband and daughter is on the phone. [name] [name] has held acalabrutinib for the last few days due to persistent head throbbing which has improved. She is recovering from recent hospitalization with mild residual cough. She has no other symptoms including fevers, chills, sweats, shortness of breath or chest pain.She has no other complaints. Rest of 10 point ROS is listed below.   MEDICAL HISTORY:  Past Medical History:   Diagnosis Date   Alopecia    Anxiety    Arthritis    "spine" (07/28/2013)   Breast cancer ([place]) 1997; 2014   right   CAP (community acquired pneumonia)    admission 06-04-2013, failed outpatient   Chronic back pain    "lower back and upper neck" (07/28/2013)   CLL (chronic lymphocytic leukemia) ([place]) 05/2013   DDD (degenerative disc disease)    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 ear    Depression    Diverticulosis    Elevated LFTs    Fibromyalgia 1978   GERD (gastroesophageal reflux disease)    Hematuria    History of syncope    episode 2008--  no recurrence since   HLD (hyperlipidemia)    Hypothyroidism    Kidney stones 2014   Plantar fasciitis    Ruptured disk    one in neck and two in back   S/P chemotherapy, time since greater than 12 weeks    Sinus tachycardia    mild resting   Skin cancer    nose   Stool incontinence    "at times recently"     SURGICAL HISTORY: Past Surgical History:  Procedure Laterality Date   ANTERIOR LATERAL LUMBAR FUSION 4 LEVELS Left 04/25/2016   Procedure: LEFT LUMBAR ONE-TWO, LUMBAR TWO-THREE, LUMBAR THREE-FOUR, LUMBAR FOUR-FIVE ANTERIOR LATERAL LUMBAR FUSION;  Surgeon: Earnie Larsson, MD;  Location: [facility];  Service: Neurosurgery;  Laterality: Left;   APPENDECTOMY  4656   APPLICATION OF ROBOTIC ASSISTANCE FOR SPINAL PROCEDURE N/A 04/06/2017   Procedure: APPLICATION OF ROBOTIC ASSISTANCE FOR SPINAL PROCEDURE;  Surgeon: Earnie Larsson, MD;  Location: [facility];  Service: Neurosurgery;  Laterality: N/A;   BREAST BIOPSY Right 2014  BREAST LUMPECTOMY Right 1997   BREAST LUMPECTOMY WITH AXILLARY LYMPH NODE DISSECTION Right 1997   CATARACT EXTRACTION, BILATERAL  2019   Dr. Kathlen Mody   CYSTOSCOPY WITH RETROGRADE PYELOGRAM, URETEROSCOPY AND STENT PLACEMENT Bilateral 06/17/2013   Procedure: CYSTOSCOPY WITH RETROGRADE PYELOGRAM, URETEROSCOPY AND LEFT DOUBLE  J STENT PLACEMENT RIGHT URETERAL HOLMIIUM LASER AND DOUBLE J STENT ;  Surgeon: Hanley Ben, MD;  Location:  [facility];  Service: Urology;  Laterality: Bilateral;   HOLMIUM LASER APPLICATION Bilateral 96/22/2979   Procedure: HOLMIUM LASER APPLICATION;  Surgeon: Hanley Ben, MD;  Location: [facility];  Service: Urology;  Laterality: Bilateral;   LITHOTRIPSY Left 06/2013   LUMBAR EPIDURAL INJECTION     has had 7 injections   PORT-A-CATH REMOVAL Left 10/03/2014   Procedure: REMOVAL PORT-A-CATH;  Surgeon: Donnie Mesa, MD;  Location: [facility];  Service: General;  Laterality: Left;   PORTACATH PLACEMENT Left 07/28/2013   Procedure: ATTEMPTED INSERTION PORT-A-CATH;  Surgeon: Imogene Burn. Georgette Dover, MD;  Location: [facility];  Service: General;  Laterality: Left;   POSTERIOR LUMBAR FUSION 4 LEVEL N/A 04/25/2016   Procedure: LUMBAR FIVE-SACRAL ONE POSTERIOR LUMBAR INTERBODY FUSION, THORACIC NINE-SACRAL ONE POSTERIOR LATERAL ARTHRODESIS WITH PEDICLE SCREWS;  Surgeon: Earnie Larsson, MD;  Location: [facility];  Service: Neurosurgery;  Laterality: N/A;   RETINAL DETACHMENT SURGERY Right 2013   ROBOTIC ASSITED PARTIAL NEPHRECTOMY Right 02/05/2015   Procedure: ROBOTIC ASSITED PARTIAL NEPHRECTOMY;  Surgeon: Raynelle Bring, MD;  Location: [facility];  Service: Urology;  Laterality: Right;   SIMPLE MASTECTOMY WITH AXILLARY SENTINEL NODE BIOPSY Right 07/28/2013   Procedure: RIGHT TOTAL  MASTECTOMY;  Surgeon: Imogene Burn. Georgette Dover, MD;  Location: [facility];  Service: General;  Laterality: Right;   TONSILLECTOMY  AGE 54   TOTAL ABDOMINAL HYSTERECTOMY W/ BILATERAL SALPINGOOPHORECTOMY  1997    SOCIAL HISTORY: Social History   Socioeconomic History   Marital status: Married    Spouse name: Philip   Number of children: 1   Years of education: Not on file   Highest education level: Not on file  Occupational History   Occupation: homemaker  Tobacco Use   Smoking status: Never   Smokeless tobacco: Never  Vaping Use   Vaping Use: Never used  Substance and Sexual Activity   Alcohol use: No    Drug use: No   Sexual activity: Not Currently    Partners: Male    Birth control/protection: Surgical    Comment: TAH/BSO  Other Topics Concern   Not on file  Social History Narrative   Not on file   Social Determinants of Health   Financial Resource Strain: Not on file  Food Insecurity: No Food Insecurity (09/03/2022)   Hunger Vital Sign    Worried About Running Out of Food in the Last Year: Never true    Ran Out of Food in the Last Year: Never true  Transportation Needs: No Transportation Needs (09/03/2022)   PRAPARE - Hydrologist (Medical): No    Lack of Transportation (Non-Medical): No  Physical Activity: Not on file  Stress: Not on file  Social Connections: Not on file  Intimate Partner Violence: Not At Risk (09/03/2022)   Humiliation, Afraid, Rape, and Kick questionnaire    Fear of Current or Ex-Partner: No    Emotionally Abused: No    Physically Abused: No    Sexually Abused: No    FAMILY HISTORY: Family History  Problem Relation Age of Onset  Heart disease Maternal Grandfather    Diabetes Maternal Grandfather    Colon cancer Maternal Aunt 55   Brain cancer Paternal Grandmother        dx in 28s   Dementia Mother    Diabetes Mother    Osteoporosis Mother    Diabetes Maternal Aunt    Prostate cancer Father 90   Bipolar disorder Maternal Aunt    Stroke Maternal Aunt    Stomach cancer Paternal Uncle        dx in late 78s    ALLERGIES:  is allergic to lasix [furosemide], lithium, sulfa antibiotics, trazodone and nefazodone, and lyrica [pregabalin].  MEDICATIONS:  Current Outpatient Medications  Medication Sig Dispense Refill   allopurinol (ZYLOPRIM) 300 MG tablet TAKE 1 TABLET(300 MG) BY MOUTH DAILY (Patient taking differently: Take 300 mg by mouth daily.) 90 tablet 4   Apoaequorin (PREVAGEN PO) Take 1 tablet by mouth daily.     aspirin 81 MG EC tablet Take 1 tablet by mouth daily.     benzonatate (TESSALON) 200 MG capsule Take  200 mg by mouth 3 (three) times daily.     brexpiprazole (REXULTI) 1 MG TABS tablet Take 1 mg by mouth daily.     buPROPion (WELLBUTRIN XL) 300 MG 24 hr tablet Take 300 mg by mouth daily.     CALCIUM CITRATE PO Take 1 tablet by mouth daily.     Cholecalciferol (VITAMIN D3) 50 MCG (2000 UT) TABS Take 1 tablet by mouth daily.     Cyanocobalamin (VITAMIN B12) 1000 MCG TBCR Take 1 tablet by mouth daily.     diclofenac Sodium (VOLTAREN) 1 % GEL Apply 2 g topically 4 (four) times daily. (Patient taking differently: Apply 2 g topically daily as needed (for pain).) 2 g 3   ferrous sulfate 325 (65 FE) MG tablet Take 325 mg by mouth daily with breakfast.     gabapentin (NEURONTIN) 100 MG capsule Take 100 mg by mouth at bedtime.     guaiFENesin (MUCINEX) 600 MG 12 hr tablet Take 600 mg by mouth 2 (two) times daily as needed for to loosen phlegm.     levothyroxine (SYNTHROID, LEVOTHROID) 88 MCG tablet Take 88 mcg by mouth daily before breakfast.  4   Multiple Vitamins-Minerals (WOMENS 50+ MULTI VITAMIN/MIN) TABS Take 1 tablet by mouth daily.     omeprazole (PRILOSEC) 40 MG capsule TAKE 1 CAPSULE(40 MG) BY MOUTH DAILY AFTER BREAKFAST 90 capsule 0   rosuvastatin (CRESTOR) 5 MG tablet TAKE 1 TABLET(5 MG) BY MOUTH DAILY (Patient taking differently: Take 5 mg by mouth daily.) 90 tablet 2   UNABLE TO FIND Take 1 tablet by mouth 3 (three) times daily. Med Name: Metatrim     zinc gluconate 50 MG tablet Take 50 mg by mouth daily.     No current facility-administered medications for this visit.    REVIEW OF SYSTEMS:   Constitutional: ( - ) fevers, ( - )  chills , ( - ) night sweats Eyes: ( - ) blurriness of vision, ( - ) double vision, ( - ) watery eyes Ears, nose, mouth, throat, and face: ( - ) mucositis, ( - ) sore throat Respiratory: ( - ) cough, ( - ) dyspnea, ( - ) wheezes Cardiovascular: ( - ) palpitation, ( - ) chest discomfort, ( - ) lower extremity swelling Gastrointestinal:  ( - ) nausea, ( - )  heartburn, ( - ) change in bowel habits Skin: ( - ) abnormal skin rashes  Lymphatics: ( - ) new lymphadenopathy, ( - ) easy bruising Neurological: ( - ) numbness, ( - ) tingling, ( - ) new weaknesses Behavioral/Psych: ( - ) mood change, ( - ) new changes  All other systems were reviewed with the patient and are negative.  PHYSICAL EXAMINATION:  Vitals:   09/23/22 1346  BP: 131/61  Pulse: 90  Resp: 16  Temp: 98.1 F (36.7 C)  SpO2: 97%   Filed Weights   09/23/22 1346  Weight: 156 lb 6.4 oz (70.9 kg)    GENERAL: well appearing elderly Caucasian female. alert, no distress and comfortable SKIN:  skin color, texture, turgor are normal, no rashes or significant lesions EYES: conjunctiva are pink and non-injected, sclera clear LUNGS: clear to auscultation and percussion with normal breathing effort HEART: regular rate & rhythm and no murmurs and no lower extremity edema Musculoskeletal: no cyanosis of digits and no clubbing  PSYCH: alert & oriented x 3, fluent speech NEURO: no focal motor/sensory deficits  LABORATORY DATA:  I have reviewed the data as listed    Latest Ref Rng & Units 09/23/2022    1:15 PM 09/06/2022    5:16 AM 09/04/2022    5:31 AM  CBC  WBC 4.0 - 10.5 K/uL 31.7  69.7  53.9   Hemoglobin 12.0 - 15.0 g/dL 10.3  8.0  8.0   Hematocrit 36.0 - 46.0 % 31.1  26.7  24.7   Platelets 150 - 400 K/uL 201  349  250        Latest Ref Rng & Units 09/23/2022    1:15 PM 09/06/2022    5:16 AM 09/05/2022    4:55 PM  CMP  Glucose 70 - 99 mg/dL 108  117  118   BUN 8 - 23 mg/dL '9  17  15   '$ Creatinine 0.44 - 1.00 mg/dL 0.81  0.73  0.71   Sodium 135 - 145 mmol/L 132  132  128   Potassium 3.5 - 5.1 mmol/L 3.9  3.9  3.2   Chloride 98 - 111 mmol/L 101  102  101   CO2 22 - 32 mmol/L '25  17  17   '$ Calcium 8.9 - 50.0 mg/dL 9.2  8.3  8.5   Total Protein 6.5 - 8.1 g/dL 6.1     Total Bilirubin 0.3 - 1.2 mg/dL 0.3     Alkaline Phos 38 - 126 U/L 111     AST 15 - 41 U/L 20     ALT 0 - 44  U/L 19       RADIOGRAPHIC STUDIES: DG Chest Port 1 View  Result Date: 09/03/2022 CLINICAL DATA:  Cough and shortness of breath EXAM: PORTABLE CHEST 1 VIEW COMPARISON:  10/10/2020 FINDINGS: Normal cardiomediastinal silhouette. Aortic atherosclerotic calcification. Hazy airspace and interstitial opacities in the right lower lung. Mild diffuse interstitial coarsening. Low lung volumes. Trace right pleural effusion. No pneumothorax. Thoracolumbar fusion hardware. Surgical clips right axilla. IMPRESSION: Right lower lung pneumonia. Electronically Signed   By: Placido Sou M.D.   On: 09/03/2022 01:51    ASSESSMENT & PLAN [name] [name] is a 77 y.o. 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  History Adapted from Dr. Virgie Dad last note:   (1) status post right breast lumpectomy and axillary lymph node dissection in 1997 for a stage I breast cancer, treated with adjuvant radiation and tamoxifen for 5 years   (2) status post  right breast lower outer quadrant biopsy 07/13/2013 for a clinical T1c N0, stage IA invasive ductal carcinoma, grade 2, estrogen receptor 100% positive, progesterone receptor 13% positive, with an MIB-1 of 33%, and HER-2 amplification by CISH with a HER2/CEP 17 ratio of 3.19, and an average HER-2 copy number per cell of 4.15   (3) status post right mastectomy 07/28/2013 for a pT1c pN0, stage IA invasive ductal carcinoma, grade 3, with close but negative margins. Prognostic panel was not repeated             (a) the patient met with Dr. Harlow Mares and has decided against reconstruction   (4) completed weekly paclitaxel x12 12/06/2913, with trastuzumab/ pertuzumab every 3 weeks; pertuzumab was held with third dose on 11/01/2013 due to diarrhea, tried a half dose on cycle 4 again with diarrhea developing   (5) trastuzumab (started 09/20/2013) continued for 1 year, last dose 09/21/2014;             (a) final  echocardiogram 09/07/2014 showed an ejection fraction of 55-60%   (6) anastrozole started May 2015; completed April 2020             (a) bone density 12/15/2013 normal             (b) bone density 02/01/2016 at [facility] was normal with a T score of -1.0     OTHER PROBLEMS: (a) History of chronic lymphoid leukemia diagnosed by flow cytometry 06/29/2013, the cells being CD5, CD20 and CD23 positive, CD10 negative.              (1) right renal mass resected on 02/05/15, consisting of atypical lymphoid proliferation composed of monotonous small lymphocytes              (2) Anemia with a normal MCV and normal ferritin-- B-12 and folate normal; stable             (3)  ibrutinib 280 milligrams daily started 03/26/2019, discontinued 06/03/2019 because of concerns regarding dosing             (4) rituximab weekly x8 started 06/15/2019 completed December 2020             (5) maintenance rituximab (Q 2 months) started 10/25/2019, continued through 08/03/2020  (6) 04/29/2022: Started acalabrutinib 100 mg PO twice daily   # CLL Rai Stage I --Due to rapid doubling time of white blood cell count from 28.3 on 10/07/2021 to 66.7 on 04/09/2022, recommendation was to initiate therapy with acalabrutinib 100 mg twice daily, started on 04/29/2022 --Last imaging of the abdomen in 2022 showed no evidence of splenomegaly Plan:  --Labs today show WBC 31.7. There is mild anemia with Hgb 10.3.  Creatinine and LFTs normal.  --Held acalabrutinib due to worsening head throbbing.  --Dr. Lorenso Courier recommends to switch therapies to Zanubrutinib 160 mg BID. New prescription will be sent tomorrow.  --RTC in 3-4 weeks with labs.   #Head throbbing: --Patient reports throbbing sensation without headaches. Likely secondary to acalabrutinib. Since holding medication, symptoms have improved.  --Advised that headaches respond well to caffeine and trying to drink a caffeinated beverage. --Monitor for now and advised to follow up if symptoms  worsen.  # ER + Breast Cancer in Survivorship -- Continue yearly mammograms. --last mammogram on 06/09/2022 --Currently on survivorship.  No orders of the defined types were placed in this encounter.  All questions were answered. The patient knows to call the clinic with any problems, questions or concerns.  I have spent  a total of 30 minutes minutes of face-to-face and non-face-to-face time, preparing to see the patient, performing a medically appropriate examination, counseling and educating the patient, ordering tests/procedures, documenting clinical information in the electronic health record, and care coordination.   Dede Query PA-C Dept of Hematology and [place] at [facility] Phone: [phone]  09/23/2022 3:53 PM

## 2022-09-24 ENCOUNTER — Telehealth: Payer: Self-pay | Admitting: Hematology and Oncology

## 2022-09-24 ENCOUNTER — Telehealth: Payer: Self-pay | Admitting: Pharmacy Technician

## 2022-09-24 ENCOUNTER — Telehealth: Payer: Self-pay | Admitting: Pharmacist

## 2022-09-24 ENCOUNTER — Other Ambulatory Visit (HOSPITAL_COMMUNITY): Payer: Self-pay

## 2022-09-24 NOTE — Telephone Encounter (Signed)
Oral Chemotherapy Pharmacist Encounter  I spoke with patient for overview of: Brukinsa (zanubrutinib) for the treatment of CLL, planned duration until disease progression or unacceptable toxicity.   Counseled patient on administration, dosing, side effects, monitoring, drug-food interactions, safe handling, storage, and disposal.  Patient will take Brukinsa '80mg'$  capsules, 2 capsules ('160mg'$ ) by mouth every 12 hours.  Patient knows to avoid grapefruit or grapefruit juice while on therapy with Brukinsa.  Brukinsa start date: 09/26/22 PM dose  Adverse effects include but are not limited to: bruising, decreased blood counts, rash, diarrhea, fatigue, musculoskeletal pain/arthralgias, change in kidney function (increase of serum creatinine), hyperglycemia.  Patient will obtain anti diarrheal and alert the office of 4 or mor loose stools above baseline.  Reviewed with patient importance of keeping a medication schedule and plan for any missed doses. No barriers to medication adherence identified.  Medication reconciliation performed and medication/allergy list updated.  All questions answered.  [name] [name] voiced understanding and appreciation.   Medication education handout placed in mail for patient. Patient knows to call the office with questions or concerns. Oral Chemotherapy Clinic phone number provided to patient.   [name] [name], PharmD, BCPS, BCOP Hematology/Oncology Clinical Pharmacist [facility] [place] [phone] 09/24/2022 3:20 PM

## 2022-09-24 NOTE — Telephone Encounter (Signed)
Called patient per 1/30 los notes to schedule f/u. Left voicemail with new appointment information and contact information for rescheduling.

## 2022-09-24 NOTE — Telephone Encounter (Signed)
Oral Oncology Patient Advocate Encounter   Was successful in securing patient an $3.250 grant from Patient [place] [facility]) to provide copayment coverage for Brukinsa.  This will keep the out of pocket expense at $0.     I have spoken with the patient.    The billing information is as follows and has been shared with [facility].   Member ID: [id number] Group ID: [id number] RxBin: [id number] Dates of Eligibility: 09/24/22 through 09/24/23  Fund:  [place], [place] Patient [place] Direct Number: [phone]  Fax: [phone]

## 2022-09-24 NOTE — Telephone Encounter (Signed)
Oral Oncology Patient Advocate Encounter  Prior Authorization for Brukinsa has been approved.    PA# [id number] Effective dates: 09/24/22 through 09/25/23  Patients co-pay is $3,316.92.    Lady Deutscher, CPhT-Adv Oncology Pharmacy Patient [place] Direct Number: [phone]  Fax: [phone]

## 2022-09-24 NOTE — Telephone Encounter (Signed)
Oral Oncology Patient Advocate Encounter   Received notification that prior authorization for Brukinsa is required.   PA submitted on 09/24/22 Key [id number] Status is pending     Lady Deutscher, CPhT-Adv Oncology Pharmacy Patient [place] Direct Number: [phone]  Fax: [phone]

## 2022-09-24 NOTE — Telephone Encounter (Signed)
Oral Oncology Patient Advocate Encounter   Was successful in securing patient a $ 4,500 grant from [employer] [place] [facility]) to provide copayment coverage for [place].  This will keep the out of pocket expense at $0.     I have spoken with the patient.  The billing information is as follows and has been shared with [facility].   Member ID: [id number] Group ID: [id number] RxBin: [id number] Dates of Eligibility: 09/24/22 through 09/24/23  Fund:  [place], [place] [facility] Patient [place] Direct Number: [phone]  Fax: [phone]

## 2022-09-24 NOTE — Telephone Encounter (Signed)
Oral Oncology Pharmacist Encounter  Received new prescription for Brukinsa (zanubrutinib) for the treatment of CLL, planned duration until disease progression or unacceptable drug toxicity.  CBC w/ Diff and CMP from 09/23/22 assessed, noted pt with WBC 31.7 K/uL, likely secondary to CLL. LDH from 09/23/22 168 U/L and WNL. Prescription dose and frequency assessed for appropriateness. Appropriate for therapy initiation.   Current medication list in Epic reviewed, DDIs with Brukinsa identified: Category C drug-drug interaction between Brukinsa and Aspirin - Brukinsa can increase antiplatelet effects of ASA leading to increased risk of bleed/bruise. No change in therapy warranted at this time, will instruct patient to monitor for s/sx of bleeding/bruising.   Evaluated chart and no patient barriers to medication adherence noted.   Patient agreement for treatment documented in MD note on 09/23/22.  Prescription has been e-scribed to the [facility] for benefits analysis and approval.  Oral Oncology Clinic will continue to follow for insurance authorization, copayment issues, initial counseling and start date.  Leron Croak, PharmD, BCPS, BCOP Hematology/Oncology Clinical Pharmacist [facility] [place] [phone] 09/24/2022 9:02 AM

## 2022-09-25 ENCOUNTER — Other Ambulatory Visit: Payer: Self-pay

## 2022-09-25 LAB — PARATHYROID HORMONE, INTACT (NO CA): PTH: 12 pg/mL — ABNORMAL LOW (ref 15–65)

## 2022-09-29 ENCOUNTER — Telehealth: Payer: Self-pay

## 2022-09-29 NOTE — Telephone Encounter (Signed)
Electronically faxed PTH results to Dr. Lambert Mody office.

## 2022-10-06 ENCOUNTER — Ambulatory Visit (HOSPITAL_COMMUNITY)
Admission: RE | Admit: 2022-10-06 | Discharge: 2022-10-06 | Disposition: A | Discharge status: Home / Self Care | Payer: Medicare Other | Source: Ambulatory Visit | Attending: Internal Medicine | Admitting: Internal Medicine

## 2022-10-06 DIAGNOSIS — R748 Abnormal levels of other serum enzymes: Secondary | ICD-10-CM

## 2022-10-06 DIAGNOSIS — R7989 Other specified abnormal findings of blood chemistry: Secondary | ICD-10-CM | POA: Diagnosis not present

## 2022-10-08 ENCOUNTER — Other Ambulatory Visit (HOSPITAL_COMMUNITY): Payer: Self-pay

## 2022-10-08 ENCOUNTER — Other Ambulatory Visit: Payer: Self-pay

## 2022-10-08 ENCOUNTER — Telehealth: Payer: Self-pay | Admitting: Pharmacist

## 2022-10-08 ENCOUNTER — Other Ambulatory Visit: Payer: Self-pay | Admitting: Hematology and Oncology

## 2022-10-08 MED ORDER — ACALABRUTINIB MALEATE 100 MG PO TABS
100.0000 mg | ORAL_TABLET | Freq: Two times a day (BID) | ORAL | 2 refills | Status: DC
  Filled 2022-10-08 (×2): qty 60, 30d supply, fill #0
  Filled 2022-10-29: qty 60, 30d supply, fill #1
  Filled 2022-11-28: qty 60, 30d supply, fill #2

## 2022-10-08 NOTE — Telephone Encounter (Signed)
Oral Chemotherapy Pharmacist Encounter   Received notification from [facility] that patient reported she had never started Brukinsa and had continued on her Calquence because she didn't want to waste the medication. Patient reports that she was no longer having "pounding in her head", which was the initial reason Calquence was going to be switched to [place].   Confirmed with Dr. Lorenso Courier that it is OK for patient to continue on Calquence at this time. MD sent in new Rx for Calquence. Called patient and she stated she had ~6 days left of Calquence on hand. Will have the pharmacy set up shipment of Calquence to arrive to patient's home on Friday 10/10/22 so that she will not run out of medication. Informed patient she could bring her unopened Brukinsa to her next office visit appt on 10/13/22 to help with disposal of medication.   Patient expressed appreciation - no other questions or concerns at this time. Patient knows to reach out to the office if she has questions or concerns.   Leron Croak, PharmD, BCPS, BCOP Hematology/Oncology Clinical Pharmacist [facility] [place] [phone] 10/08/2022 2:10 PM

## 2022-10-12 ENCOUNTER — Other Ambulatory Visit: Payer: Self-pay | Admitting: Hematology and Oncology

## 2022-10-12 DIAGNOSIS — C9110 Chronic lymphocytic leukemia of B-cell type not having achieved remission: Secondary | ICD-10-CM

## 2022-10-12 NOTE — Progress Notes (Unsigned)
[place] [phone]   [phone]  PROGRESS NOTE  Patient Care Team: Deland Pretty, MD as PCP - General (Internal Medicine) Jerline Pain, MD as PCP - Cardiology (Cardiology) Milus Banister, MD as Attending Physician (Gastroenterology) Megan Salon, MD as Consulting Physician (Gynecology) Donnie Mesa, MD as Consulting Physician (General Surgery) Noemi Chapel, NP as Nurse Practitioner Rod Can, MD as Consulting Physician (Orthopedic Surgery) Latanya Maudlin, MD as Consulting Physician (Orthopedic Surgery) Ria Clock, MD as Attending Physician (Radiology) Adele Dan, MD as Referring Physician (Orthopedic Surgery) Orson Slick, MD as Consulting Physician (Hematology and Oncology)  Hematological/Oncological History # CLL Rai Stage 1  06/04/2021: last visit with Dr. Jana Hakim. Detailed history of his care history noted below.  09/18/2021: WBC 43.3, Hgb 13.0, MCV 87.1, Plt 275 09/23/2021: establish care with Dr. Lorenso Courier. WBC 30.1, Hgb 12.3, MCV 87.8, Plt 241 03/11/2022: WBC 57.4, Hgb 11.2, MCV 88.3, Plt 181  04/29/2022: Started acalabrutinib 100 mg PO twice daily  Interval History:  [name] [name] 77 y.o. 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 The patient's last visit was on 09/23/2022. In the interim since the last visit she has restarted the acalabrutinib.  On exam today [name] [name] is accompanied by her daughter.  She reports that she recently stopped taking the Cymbalta and at that time the pounding in her head sensation has diminished greatly.  She reports that she did have a 4 to 5-day period where she "could not hear" and she took Sudafed which helped resolve it.  She reports that when the pounding in her head began to dissipate and she went back to taking her Calquence.  She has had no other issues with the medication.  She reports that  there is still a "slight or faint pounding at night".  She reports that it is vague and much more tolerable.  She does report that she is recovering well from her hospitalization in January where she was treated for RSV/pneumonia.  Her sodium levels were quite low at that time and she reports that she was "looney tunes".  She notes that her primary symptom is fatigue at this time.  She does continue to take p.o. iron therapy and is tolerating it well.  She has no other symptoms including fevers, chills, sweats, shortness of breath or chest pain.She has no other complaints. Rest of 10 point ROS is listed below.   MEDICAL HISTORY:  Past Medical History:  Diagnosis Date   Alopecia    Anxiety    Arthritis    "spine" (07/28/2013)   Breast cancer () 1997; 2014   right   CAP (community acquired pneumonia)    admission 06-04-2013, failed outpatient   Chronic back pain    "lower back and upper neck" (07/28/2013)   CLL (chronic lymphocytic leukemia) ([place]) 05/2013   DDD (degenerative disc disease)    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 ear    Depression    Diverticulosis    Elevated LFTs    Fibromyalgia 1978   GERD (gastroesophageal reflux disease)    Hematuria    History of syncope    episode 2008--  no recurrence since   HLD (hyperlipidemia)    Hypothyroidism    Kidney stones 2014   Plantar fasciitis    Ruptured disk    one in neck and two in back   S/P chemotherapy, time since greater than 12 weeks  Sinus tachycardia    mild resting   Skin cancer    nose   Stool incontinence    "at times recently"     SURGICAL HISTORY: Past Surgical History:  Procedure Laterality Date   ANTERIOR LATERAL LUMBAR FUSION 4 LEVELS Left 04/25/2016   Procedure: LEFT LUMBAR ONE-TWO, LUMBAR TWO-THREE, LUMBAR THREE-FOUR, LUMBAR FOUR-FIVE ANTERIOR LATERAL LUMBAR FUSION;  Surgeon: Earnie Larsson, MD;  Location: [place] [facility];  Service: Neurosurgery;  Laterality: Left;   APPENDECTOMY  0000000   APPLICATION OF ROBOTIC ASSISTANCE  FOR SPINAL PROCEDURE N/A 04/06/2017   Procedure: APPLICATION OF ROBOTIC ASSISTANCE FOR SPINAL PROCEDURE;  Surgeon: Earnie Larsson, MD;  Location: [facility];  Service: Neurosurgery;  Laterality: N/A;   BREAST BIOPSY Right 2014   BREAST LUMPECTOMY Right 1997   BREAST LUMPECTOMY WITH AXILLARY LYMPH NODE DISSECTION Right 1997   CATARACT EXTRACTION, BILATERAL  2019   Dr. Kathlen Mody   CYSTOSCOPY WITH RETROGRADE PYELOGRAM, URETEROSCOPY AND STENT PLACEMENT Bilateral 06/17/2013   Procedure: CYSTOSCOPY WITH RETROGRADE PYELOGRAM, URETEROSCOPY AND LEFT DOUBLE  J STENT PLACEMENT RIGHT URETERAL HOLMIIUM LASER AND DOUBLE J STENT ;  Surgeon: Hanley Ben, MD;  Location: [facility];  Service: Urology;  Laterality: Bilateral;   HOLMIUM LASER APPLICATION Bilateral [id number]   Procedure: HOLMIUM LASER APPLICATION;  Surgeon: Hanley Ben, MD;  Location: [place];  Service: Urology;  Laterality: Bilateral;   LITHOTRIPSY Left 06/2013   LUMBAR EPIDURAL INJECTION     has had 7 injections   PORT-A-CATH REMOVAL Left 10/03/2014   Procedure: REMOVAL PORT-A-CATH;  Surgeon: Donnie Mesa, MD;  Location: [place];  Service: General;  Laterality: Left;   PORTACATH PLACEMENT Left 07/28/2013   Procedure: ATTEMPTED INSERTION PORT-A-CATH;  Surgeon: Imogene Burn. Georgette Dover, MD;  Location: [facility];  Service: General;  Laterality: Left;   POSTERIOR LUMBAR FUSION 4 LEVEL N/A 04/25/2016   Procedure: LUMBAR FIVE-SACRAL ONE POSTERIOR LUMBAR INTERBODY FUSION, THORACIC NINE-SACRAL ONE POSTERIOR LATERAL ARTHRODESIS WITH PEDICLE SCREWS;  Surgeon: Earnie Larsson, MD;  Location: [place] [facility];  Service: Neurosurgery;  Laterality: N/A;   RETINAL DETACHMENT SURGERY Right 2013   ROBOTIC ASSITED PARTIAL NEPHRECTOMY Right 02/05/2015   Procedure: ROBOTIC ASSITED PARTIAL NEPHRECTOMY;  Surgeon: Raynelle Bring, MD;  Location: [facility];  Service: Urology;  Laterality: Right;   SIMPLE MASTECTOMY WITH AXILLARY SENTINEL NODE BIOPSY  Right 07/28/2013   Procedure: RIGHT TOTAL  MASTECTOMY;  Surgeon: Imogene Burn. Georgette Dover, MD;  Location: [place];  Service: General;  Laterality: Right;   TONSILLECTOMY  AGE 69   TOTAL ABDOMINAL HYSTERECTOMY W/ BILATERAL SALPINGOOPHORECTOMY  1997    SOCIAL HISTORY: Social History   Socioeconomic History   Marital status: Married    Spouse name: Philip   Number of children: 1   Years of education: Not on file   Highest education level: Not on file  Occupational History   Occupation: homemaker  Tobacco Use   Smoking status: Never   Smokeless tobacco: Never  Vaping Use   Vaping Use: Never used  Substance and Sexual Activity   Alcohol use: No   Drug use: No   Sexual activity: Not Currently    Partners: Male    Birth control/protection: Surgical    Comment: TAH/BSO  Other Topics Concern   Not on file  Social History Narrative   Not on file   Social Determinants of Health   Financial Resource Strain: Not on file  Food Insecurity: No Food Insecurity (09/03/2022)   Hunger Vital Sign  Worried About Charity fundraiser in the Last Year: Never true    [place] in the Last Year: Never true  Transportation Needs: No Transportation Needs (09/03/2022)   PRAPARE - Hydrologist (Medical): No    Lack of Transportation (Non-Medical): No  Physical Activity: Not on file  Stress: Not on file  Social Connections: Not on file  Intimate Partner Violence: Not At Risk (09/03/2022)   Humiliation, Afraid, Rape, and Kick questionnaire    Fear of Current or Ex-Partner: No    Emotionally Abused: No    Physically Abused: No    Sexually Abused: No    FAMILY HISTORY: Family History  Problem Relation Age of Onset   Heart disease Maternal Grandfather    Diabetes Maternal Grandfather    Colon cancer Maternal Aunt 26   Brain cancer Paternal Grandmother        dx in 70s   Dementia Mother    Diabetes Mother    Osteoporosis Mother    Diabetes Maternal Aunt     Prostate cancer Father 29   Bipolar disorder Maternal Aunt    Stroke Maternal Aunt    Stomach cancer Paternal Uncle        dx in late 59s    ALLERGIES:  is allergic to lasix [furosemide], lithium, sulfa antibiotics, trazodone and nefazodone, and lyrica [pregabalin].  MEDICATIONS:  Current Outpatient Medications  Medication Sig Dispense Refill   acalabrutinib maleate (CALQUENCE) 100 MG tablet Take 1 tablet (100 mg total) by mouth 2 (two) times daily. 60 tablet 2   allopurinol (ZYLOPRIM) 300 MG tablet TAKE 1 TABLET(300 MG) BY MOUTH DAILY 90 tablet 4   Apoaequorin (PREVAGEN PO) Take 1 tablet by mouth daily.     aspirin 81 MG EC tablet Take 1 tablet by mouth daily.     benzonatate (TESSALON) 200 MG capsule Take 200 mg by mouth 3 (three) times daily.     brexpiprazole (REXULTI) 1 MG TABS tablet Take 1 mg by mouth daily.     buPROPion (WELLBUTRIN XL) 300 MG 24 hr tablet Take 300 mg by mouth daily.     CALCIUM CITRATE PO Take 1 tablet by mouth daily.     Cholecalciferol (VITAMIN D3) 50 MCG (2000 UT) TABS Take 1 tablet by mouth daily.     Cyanocobalamin (VITAMIN B12) 1000 MCG TBCR Take 1 tablet by mouth daily.     diclofenac Sodium (VOLTAREN) 1 % GEL Apply 2 g topically 4 (four) times daily. (Patient taking differently: Apply 2 g topically daily as needed (for pain).) 2 g 3   ferrous sulfate 325 (65 FE) MG tablet Take 325 mg by mouth daily with breakfast.     fluvoxaMINE (LUVOX) 50 MG tablet Take 100 mg by mouth at bedtime.     gabapentin (NEURONTIN) 100 MG capsule Take 100 mg by mouth 2 (two) times daily.     guaiFENesin (MUCINEX) 600 MG 12 hr tablet Take 600 mg by mouth 2 (two) times daily as needed for to loosen phlegm.     levothyroxine (SYNTHROID, LEVOTHROID) 88 MCG tablet Take 88 mcg by mouth daily before breakfast.  4   Multiple Vitamins-Minerals (WOMENS 50+ MULTI VITAMIN/MIN) TABS Take 1 tablet by mouth daily.     omeprazole (PRILOSEC) 40 MG capsule TAKE 1 CAPSULE(40 MG) BY MOUTH DAILY  AFTER BREAKFAST 90 capsule 0   rosuvastatin (CRESTOR) 5 MG tablet TAKE 1 TABLET(5 MG) BY MOUTH DAILY (Patient taking differently: Take  5 mg by mouth daily.) 90 tablet 2   UNABLE TO FIND Take 1 tablet by mouth 3 (three) times daily. Med Name: Metatrim     zinc gluconate 50 MG tablet Take 50 mg by mouth daily.     No current facility-administered medications for this visit.    REVIEW OF SYSTEMS:   Constitutional: ( - ) fevers, ( - )  chills , ( - ) night sweats Eyes: ( - ) blurriness of vision, ( - ) double vision, ( - ) watery eyes Ears, nose, mouth, throat, and face: ( - ) mucositis, ( - ) sore throat Respiratory: ( - ) cough, ( - ) dyspnea, ( - ) wheezes Cardiovascular: ( - ) palpitation, ( - ) chest discomfort, ( - ) lower extremity swelling Gastrointestinal:  ( - ) nausea, ( - ) heartburn, ( - ) change in bowel habits Skin: ( - ) abnormal skin rashes Lymphatics: ( - ) new lymphadenopathy, ( - ) easy bruising Neurological: ( - ) numbness, ( - ) tingling, ( - ) new weaknesses Behavioral/Psych: ( - ) mood change, ( - ) new changes  All other systems were reviewed with the patient and are negative.  PHYSICAL EXAMINATION:  Vitals:   10/13/22 1026  BP: 134/70  Pulse: 95  Resp: 16  Temp: 98.2 F (36.8 C)  SpO2: 100%    Filed Weights   10/13/22 1026  Weight: 157 lb 1.6 oz (71.3 kg)    GENERAL: well appearing elderly Caucasian female. alert, no distress and comfortable SKIN:  skin color, texture, turgor are normal, no rashes or significant lesions EYES: conjunctiva are pink and non-injected, sclera clear LUNGS: clear to auscultation and percussion with normal breathing effort HEART: regular rate & rhythm and no murmurs and no lower extremity edema Musculoskeletal: no cyanosis of digits and no clubbing  PSYCH: alert & oriented x 3, fluent speech NEURO: no focal motor/sensory deficits  LABORATORY DATA:  I have reviewed the data as listed    Latest Ref Rng & Units 10/13/2022     9:53 AM 09/23/2022    1:15 PM 09/06/2022    5:16 AM  CBC  WBC 4.0 - 10.5 K/uL 22.6  31.7  69.7   Hemoglobin 12.0 - 15.0 g/dL 10.7  10.3  8.0   Hematocrit 36.0 - 46.0 % 33.3  31.1  26.7   Platelets 150 - 400 K/uL 309  201  349        Latest Ref Rng & Units 10/13/2022    9:53 AM 09/23/2022    1:15 PM 09/06/2022    5:16 AM  CMP  Glucose 70 - 99 mg/dL 138  108  117   BUN 8 - 23 mg/dL 17  9  17   $ Creatinine 0.44 - 1.00 mg/dL 0.89  0.81  0.73   Sodium 135 - 145 mmol/L 132  132  132   Potassium 3.5 - 5.1 mmol/L 4.1  3.9  3.9   Chloride 98 - 111 mmol/L 100  101  102   CO2 22 - 32 mmol/L 26  25  17   $ Calcium 8.9 - 10.3 mg/dL 9.0  9.2  8.3   Total Protein 6.5 - 8.1 g/dL 6.2  6.1    Total Bilirubin 0.3 - 1.2 mg/dL 0.3  0.3    Alkaline Phos 38 - 126 U/L 91  111    AST 15 - 41 U/L 17  20    ALT 0 - 44 U/L  14  19      RADIOGRAPHIC STUDIES: US Abdomen Complete  Result Date: 10/06/2022 CLINICAL DATA:  Abnormal LFTs EXAM: ABDOMEN ULTRASOUND COMPLETE COMPARISON:  Abdominal ultrasound 07/14/2016 FINDINGS: Gallbladder: No gallstones or wall thickening visualized. No sonographic Murphy sign noted by sonographer. Common bile duct: Diameter: 0.4 cm, within normal limits Liver: No focal lesion identified. Within normal limits in parenchymal echogenicity. Portal vein is patent on color Doppler imaging with normal direction of blood flow towards the liver. IVC: No abnormality visualized. Pancreas: Not visualized due to shadowing bowel gas. Spleen: Borderline splenomegaly measuring 12.5 cm in length. Right Kidney: Length: 11.3 cm. Echogenicity within normal limits. No mass or hydronephrosis visualized. Left Kidney: Length: 10.9 cm. Echogenicity within normal limits. No mass or hydronephrosis visualized. Abdominal aorta: No aneurysm visualized. Midportion of aorta not well visualized. Other findings: None. IMPRESSION: 1.  Normal sonographic appearance of the liver. 2.  Borderline splenomegaly. 3. Pancreas and  midportion of the abdominal aorta not visualized well due to shadowing bowel gas. Electronically Signed   By: Audie Pinto M.D.   On: 10/06/2022 14:28    ASSESSMENT & PLAN SUMMAH STURDEVANT is a 77 y.o. 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  History Adapted from Dr. Virgie Dad last note:   (1) status post right breast lumpectomy and axillary lymph node dissection in 1997 for a stage I breast cancer, treated with adjuvant radiation and tamoxifen for 5 years   (2) status post right breast lower outer quadrant biopsy 07/13/2013 for a clinical T1c N0, stage IA invasive ductal carcinoma, grade 2, estrogen receptor 100% positive, progesterone receptor 13% positive, with an MIB-1 of 33%, and HER-2 amplification by CISH with a HER2/CEP 17 ratio of 3.19, and an average HER-2 copy number per cell of 4.15   (3) status post right mastectomy 07/28/2013 for a pT1c pN0, stage IA invasive ductal carcinoma, grade 3, with close but negative margins. Prognostic panel was not repeated             (a) the patient met with Dr. Harlow Mares and has decided against reconstruction   (4) completed weekly paclitaxel x12 12/06/2913, with trastuzumab/ pertuzumab every 3 weeks; pertuzumab was held with third dose on 11/01/2013 due to diarrhea, tried a half dose on cycle 4 again with diarrhea developing   (5) trastuzumab (started 09/20/2013) continued for 1 year, last dose 09/21/2014;             (a) final echocardiogram 09/07/2014 showed an ejection fraction of 55-60%   (6) anastrozole started May 2015; completed April 2020             (a) bone density 12/15/2013 normal             (b) bone density 02/01/2016 at [place] was normal with a T score of -1.0     OTHER PROBLEMS: (a) History of chronic lymphoid leukemia diagnosed by flow cytometry 06/29/2013, the cells being CD5, CD20 and CD23 positive, CD10 negative.              (1) right renal mass  resected on 02/05/15, consisting of atypical lymphoid proliferation composed of monotonous small lymphocytes              (2) Anemia with a normal MCV and normal ferritin-- B-12 and folate normal; stable             (3)  ibrutinib 280 milligrams daily started 03/26/2019,  discontinued 06/03/2019 because of concerns regarding dosing             (4) rituximab weekly x8 started 06/15/2019 completed December 2020             (5) maintenance rituximab (Q 2 months) started 10/25/2019, continued through 08/03/2020  (6) 04/29/2022: Started acalabrutinib 100 mg PO twice daily   # CLL Rai Stage I --Due to rapid doubling time of white blood cell count from 28.3 on 10/07/2021 to 66.7 on 04/09/2022, recommendation was to initiate therapy with acalabrutinib 100 mg twice daily, started on 04/29/2022 --Last imaging of the abdomen in 2022 showed no evidence of splenomegaly Plan:  --Labs today show WBC 22.6, hemoglobin 10.7, MCV 95.4, and platelets of 309.  Creatinine and LFTs normal.  --Held acalabrutinib due to worsening head throbbing, however symptoms resolved and patient restarted acalabrutinib 100 mg twice daily. -- If patient were to develop worsening symptoms and intolerance to acalabrutinib would recommend switching therapies to Zanubrutinib 160 mg BID.  --RTC in 4 weeks with labs.   #Head throbbing: --Patient reports throbbing sensation without headaches. Likely secondary to acalabrutinib. Since holding medication, symptoms have improved.  --Advised that headaches respond well to caffeine and trying to drink a caffeinated beverage. --Monitor for now and advised to follow up if symptoms worsen.  # ER + Breast Cancer in Survivorship -- Continue yearly mammograms. --last mammogram on 06/09/2022 --Currently on survivorship.  No orders of the defined types were placed in this encounter.  All questions were answered. The patient knows to call the clinic with any problems, questions or concerns.  I have  spent a total of 30 minutes minutes of face-to-face and non-face-to-face time, preparing to see the patient, performing a medically appropriate examination, counseling and educating the patient, ordering tests/procedures, documenting clinical information in the electronic health record, and care coordination.   Ledell Peoples, MD Department of Hematology/Oncology [place] at [facility] Phone: [phone] Pager: [phone] Email: [name]   10/13/2022 11:11 AM

## 2022-10-13 ENCOUNTER — Other Ambulatory Visit: Payer: Self-pay

## 2022-10-13 ENCOUNTER — Inpatient Hospital Stay: Payer: Medicare Other | Attending: Hematology and Oncology

## 2022-10-13 ENCOUNTER — Inpatient Hospital Stay: Payer: Medicare Other | Admitting: Hematology and Oncology

## 2022-10-13 VITALS — BP 134/70 | HR 95 | Temp 98.2°F | Resp 16 | Wt 157.1 lb

## 2022-10-13 DIAGNOSIS — Z7982 Long term (current) use of aspirin: Secondary | ICD-10-CM | POA: Diagnosis not present

## 2022-10-13 DIAGNOSIS — Z79899 Other long term (current) drug therapy: Secondary | ICD-10-CM | POA: Insufficient documentation

## 2022-10-13 DIAGNOSIS — Z853 Personal history of malignant neoplasm of breast: Secondary | ICD-10-CM | POA: Diagnosis not present

## 2022-10-13 DIAGNOSIS — Z9011 Acquired absence of right breast and nipple: Secondary | ICD-10-CM | POA: Diagnosis not present

## 2022-10-13 DIAGNOSIS — C9110 Chronic lymphocytic leukemia of B-cell type not having achieved remission: Secondary | ICD-10-CM | POA: Insufficient documentation

## 2022-10-13 DIAGNOSIS — C50511 Malignant neoplasm of lower-outer quadrant of right female breast: Secondary | ICD-10-CM | POA: Diagnosis not present

## 2022-10-13 DIAGNOSIS — Z170 Estrogen receptor positive status [ER+]: Secondary | ICD-10-CM | POA: Diagnosis not present

## 2022-10-13 LAB — CMP (CANCER CENTER ONLY)
ALT: 14 U/L (ref 0–44)
AST: 17 U/L (ref 15–41)
Albumin: 4.3 g/dL (ref 3.5–5.0)
Alkaline Phosphatase: 91 U/L (ref 38–126)
Anion gap: 6 (ref 5–15)
BUN: 17 mg/dL (ref 8–23)
CO2: 26 mmol/L (ref 22–32)
Calcium: 9 mg/dL (ref 8.9–10.3)
Chloride: 100 mmol/L (ref 98–111)
Creatinine: 0.89 mg/dL (ref 0.44–1.00)
GFR, Estimated: >60 mL/min (ref >60)
Glucose, Bld: 138 mg/dL — ABNORMAL HIGH (ref 70–99)
Potassium: 4.1 mmol/L (ref 3.5–5.1)
Sodium: 132 mmol/L — ABNORMAL LOW (ref 135–145)
Total Bilirubin: 0.3 mg/dL (ref 0.3–1.2)
Total Protein: 6.2 g/dL — ABNORMAL LOW (ref 6.5–8.1)

## 2022-10-13 LAB — CBC WITH DIFFERENTIAL (CANCER CENTER ONLY)
Abs Immature Granulocytes: 0.06 10*3/uL (ref 0.00–0.07)
Basophils Absolute: 0.1 10*3/uL (ref 0.0–0.1)
Basophils Relative: 1 %
Eosinophils Absolute: 0.3 10*3/uL (ref 0.0–0.5)
Eosinophils Relative: 1 %
HCT: 33.3 % — ABNORMAL LOW (ref 36.0–46.0)
Hemoglobin: 10.7 g/dL — ABNORMAL LOW (ref 12.0–15.0)
Immature Granulocytes: 0 %
Lymphocytes Relative: 70 %
Lymphs Abs: 15.8 10*3/uL — ABNORMAL HIGH (ref 0.7–4.0)
MCH: 30.7 pg (ref 26.0–34.0)
MCHC: 32.1 g/dL (ref 30.0–36.0)
MCV: 95.4 fL (ref 80.0–100.0)
Monocytes Absolute: 0.5 10*3/uL (ref 0.1–1.0)
Monocytes Relative: 2 %
Neutro Abs: 5.9 10*3/uL (ref 1.7–7.7)
Neutrophils Relative %: 26 %
Platelet Count: 309 10*3/uL (ref 150–400)
RBC: 3.49 MIL/uL — ABNORMAL LOW (ref 3.87–5.11)
RDW: 13.5 % (ref 11.5–15.5)
Smear Review: NORMAL
WBC Count: 22.6 10*3/uL — ABNORMAL HIGH (ref 4.0–10.5)
nRBC: 0 % (ref 0.0–0.2)

## 2022-10-13 LAB — LACTATE DEHYDROGENASE: LDH: 117 U/L (ref 98–192)

## 2022-10-15 DIAGNOSIS — E039 Hypothyroidism, unspecified: Secondary | ICD-10-CM | POA: Diagnosis not present

## 2022-10-15 DIAGNOSIS — R7989 Other specified abnormal findings of blood chemistry: Secondary | ICD-10-CM | POA: Diagnosis not present

## 2022-10-15 DIAGNOSIS — F339 Major depressive disorder, recurrent, unspecified: Secondary | ICD-10-CM | POA: Diagnosis not present

## 2022-10-21 ENCOUNTER — Other Ambulatory Visit (HOSPITAL_COMMUNITY): Payer: Self-pay

## 2022-10-27 ENCOUNTER — Ambulatory Visit: Payer: Medicare Other | Admitting: Gastroenterology

## 2022-10-29 ENCOUNTER — Other Ambulatory Visit (HOSPITAL_COMMUNITY): Payer: Self-pay

## 2022-11-06 ENCOUNTER — Other Ambulatory Visit (HOSPITAL_COMMUNITY): Payer: Self-pay

## 2022-11-06 DIAGNOSIS — H04221 Epiphora due to insufficient drainage, right lacrimal gland: Secondary | ICD-10-CM | POA: Diagnosis not present

## 2022-11-06 DIAGNOSIS — H0231 Blepharochalasis right upper eyelid: Secondary | ICD-10-CM | POA: Diagnosis not present

## 2022-11-06 DIAGNOSIS — H04563 Stenosis of bilateral lacrimal punctum: Secondary | ICD-10-CM | POA: Diagnosis not present

## 2022-11-06 DIAGNOSIS — H0279 Other degenerative disorders of eyelid and periocular area: Secondary | ICD-10-CM | POA: Diagnosis not present

## 2022-11-06 DIAGNOSIS — H02132 Senile ectropion of right lower eyelid: Secondary | ICD-10-CM | POA: Diagnosis not present

## 2022-11-14 ENCOUNTER — Other Ambulatory Visit: Payer: Self-pay | Admitting: Hematology and Oncology

## 2022-11-14 DIAGNOSIS — C50011 Malignant neoplasm of nipple and areola, right female breast: Secondary | ICD-10-CM

## 2022-11-14 DIAGNOSIS — C9110 Chronic lymphocytic leukemia of B-cell type not having achieved remission: Secondary | ICD-10-CM

## 2022-11-16 NOTE — Progress Notes (Unsigned)
[name] [name] [phone]   [phone]  PROGRESS NOTE  Patient Care Team: Deland Pretty, MD as PCP - General (Internal Medicine) Jerline Pain, MD as PCP - Cardiology (Cardiology) Milus Banister, MD as Attending Physician (Gastroenterology) Megan Salon, MD as Consulting Physician (Gynecology) Donnie Mesa, MD as Consulting Physician (General Surgery) Noemi Chapel, NP as Nurse Practitioner Rod Can, MD as Consulting Physician (Orthopedic Surgery) Latanya Maudlin, MD as Consulting Physician (Orthopedic Surgery) Ria Clock, MD as Attending Physician (Radiology) Adele Dan, MD as Referring Physician (Orthopedic Surgery) Orson Slick, MD as Consulting Physician (Hematology and Oncology)  Hematological/Oncological History # CLL Rai Stage 1  06/04/2021: last visit with Dr. Jana Hakim. Detailed history of his care history noted below.  09/18/2021: WBC 43.3, Hgb 13.0, MCV 87.1, Plt 275 09/23/2021: establish care with Dr. Lorenso Courier. WBC 30.1, Hgb 12.3, MCV 87.8, Plt 241 03/11/2022: WBC 57.4, Hgb 11.2, MCV 88.3, Plt 181  04/29/2022: Started acalabrutinib 100 mg PO twice daily  Interval History:  [name] [name] 77 y.o. 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 The patient's last visit was on 10/13/2022. In the interim since the last visit she has restarted the acalabrutinib.  On exam today [name] [name] is accompanied by her daughter.  She reports her primary symptom has been fatigue.  She currently ranks her energy is about a 6 out of 10.  She is also having some continued back pain which is chronic.  She reports that she was post to have back surgery but was canceled due to RSV and pneumonia that she had in January 2024.  She reports that she continues to take her Calquence 100 mg twice daily without any difficulty.  She reports the pounding in her head is now  considerably lighter than it was before.  It is not quite as bad.  She has had no missed doses and is taking the medication faithfully.  She is eating well and is concerned that her weight is increased to 164 pounds from 156 pounds.  She has not been having any trouble with bleeding, bruising, or dark stools.  She is not having any palpitations of the heart or signs of arrhythmia.  She is also not had any recent infections other than that RSV infection in January.  She does have a chronic cough.  Otherwise she has been at her baseline level of health.  She has no other symptoms including fevers, chills, sweats, shortness of breath or chest pain.She has no other complaints. Rest of 10 point ROS is listed below.   MEDICAL HISTORY:  Past Medical History:  Diagnosis Date   Alopecia    Anxiety    Arthritis    "spine" (07/28/2013)   Breast cancer ([place]) 1997; 2014   right   CAP (community acquired pneumonia)    admission 06-04-2013, failed outpatient   Chronic back pain    "lower back and upper neck" (07/28/2013)   CLL (chronic lymphocytic leukemia) ([place]) 05/2013   DDD (degenerative disc disease)    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 ear    Depression    Diverticulosis    Elevated LFTs    Fibromyalgia 1978   GERD (gastroesophageal reflux disease)    Hematuria    History of syncope    episode 2008--  no recurrence since   HLD (hyperlipidemia)    Hypothyroidism    Kidney stones 2014   Plantar fasciitis  Ruptured disk    one in neck and two in back   S/P chemotherapy, time since greater than 12 weeks    Sinus tachycardia    mild resting   Skin cancer    nose   Stool incontinence    "at times recently"     SURGICAL HISTORY: Past Surgical History:  Procedure Laterality Date   ANTERIOR LATERAL LUMBAR FUSION 4 LEVELS Left 04/25/2016   Procedure: LEFT LUMBAR ONE-TWO, LUMBAR TWO-THREE, LUMBAR THREE-FOUR, LUMBAR FOUR-FIVE ANTERIOR LATERAL LUMBAR FUSION;  Surgeon: Earnie Larsson, MD;  Location: [facility];   Service: Neurosurgery;  Laterality: Left;   APPENDECTOMY  0000000   APPLICATION OF ROBOTIC ASSISTANCE FOR SPINAL PROCEDURE N/A 04/06/2017   Procedure: APPLICATION OF ROBOTIC ASSISTANCE FOR SPINAL PROCEDURE;  Surgeon: Earnie Larsson, MD;  Location: [facility];  Service: Neurosurgery;  Laterality: N/A;   BREAST BIOPSY Right 2014   BREAST LUMPECTOMY Right 1997   BREAST LUMPECTOMY WITH AXILLARY LYMPH NODE DISSECTION Right 1997   CATARACT EXTRACTION, BILATERAL  2019   Dr. Kathlen Mody   CYSTOSCOPY WITH RETROGRADE PYELOGRAM, URETEROSCOPY AND STENT PLACEMENT Bilateral 06/17/2013   Procedure: CYSTOSCOPY WITH RETROGRADE PYELOGRAM, URETEROSCOPY AND LEFT DOUBLE  J STENT PLACEMENT RIGHT URETERAL HOLMIIUM LASER AND DOUBLE J STENT ;  Surgeon: Hanley Ben, MD;  Location: [facility];  Service: Urology;  Laterality: Bilateral;   HOLMIUM LASER APPLICATION Bilateral [id number]   Procedure: HOLMIUM LASER APPLICATION;  Surgeon: Hanley Ben, MD;  Location: [facility];  Service: Urology;  Laterality: Bilateral;   LITHOTRIPSY Left 06/2013   LUMBAR EPIDURAL INJECTION     has had 7 injections   PORT-A-CATH REMOVAL Left 10/03/2014   Procedure: REMOVAL PORT-A-CATH;  Surgeon: Donnie Mesa, MD;  Location: [facility];  Service: General;  Laterality: Left;   PORTACATH PLACEMENT Left 07/28/2013   Procedure: ATTEMPTED INSERTION PORT-A-CATH;  Surgeon: Imogene Burn. Georgette Dover, MD;  Location: [facility];  Service: General;  Laterality: Left;   POSTERIOR LUMBAR FUSION 4 LEVEL N/A 04/25/2016   Procedure: LUMBAR FIVE-SACRAL ONE POSTERIOR LUMBAR INTERBODY FUSION, THORACIC NINE-SACRAL ONE POSTERIOR LATERAL ARTHRODESIS WITH PEDICLE SCREWS;  Surgeon: Earnie Larsson, MD;  Location: [facility];  Service: Neurosurgery;  Laterality: N/A;   RETINAL DETACHMENT SURGERY Right 2013   ROBOTIC ASSITED PARTIAL NEPHRECTOMY Right 02/05/2015   Procedure: ROBOTIC ASSITED PARTIAL NEPHRECTOMY;  Surgeon: Raynelle Bring, MD;  Location: [facility];  Service: Urology;  Laterality: Right;   SIMPLE MASTECTOMY WITH AXILLARY SENTINEL NODE BIOPSY Right 07/28/2013   Procedure: RIGHT TOTAL  MASTECTOMY;  Surgeon: Imogene Burn. Georgette Dover, MD;  Location: [facility];  Service: General;  Laterality: Right;   TONSILLECTOMY  AGE 63   TOTAL ABDOMINAL HYSTERECTOMY W/ BILATERAL SALPINGOOPHORECTOMY  1997    SOCIAL HISTORY: Social History   Socioeconomic History   Marital status: Married    Spouse name: Philip   Number of children: 1   Years of education: Not on file   Highest education level: Not on file  Occupational History   Occupation: homemaker  Tobacco Use   Smoking status: Never   Smokeless tobacco: Never  Vaping Use   Vaping Use: Never used  Substance and Sexual Activity   Alcohol use: No   Drug use: No   Sexual activity: Not Currently    Partners: Male    Birth control/protection: Surgical    Comment: TAH/BSO  Other Topics Concern   Not on file  Social History Narrative   Not on file  Social Determinants of Health   Financial Resource Strain: Not on file  Food Insecurity: No Food Insecurity (09/03/2022)   Hunger Vital Sign    Worried About Running Out of Food in the Last Year: Never true    Ran Out of Food in the Last Year: Never true  Transportation Needs: No Transportation Needs (09/03/2022)   PRAPARE - Hydrologist (Medical): No    Lack of Transportation (Non-Medical): No  Physical Activity: Not on file  Stress: Not on file  Social Connections: Not on file  Intimate Partner Violence: Not At Risk (09/03/2022)   Humiliation, Afraid, Rape, and Kick questionnaire    Fear of Current or Ex-Partner: No    Emotionally Abused: No    Physically Abused: No    Sexually Abused: No    FAMILY HISTORY: Family History  Problem Relation Age of Onset   Heart disease Maternal Grandfather    Diabetes Maternal Grandfather    Colon cancer Maternal Aunt 44   Brain cancer Paternal Grandmother        dx in 47s    Dementia Mother    Diabetes Mother    Osteoporosis Mother    Diabetes Maternal Aunt    Prostate cancer Father 56   Bipolar disorder Maternal Aunt    Stroke Maternal Aunt    Stomach cancer Paternal Uncle        dx in late 15s    ALLERGIES:  is allergic to lasix [furosemide], lithium, sulfa antibiotics, trazodone and nefazodone, and lyrica [pregabalin].  MEDICATIONS:  Current Outpatient Medications  Medication Sig Dispense Refill   acalabrutinib maleate (CALQUENCE) 100 MG tablet Take 1 tablet (100 mg total) by mouth 2 (two) times daily. 60 tablet 2   allopurinol (ZYLOPRIM) 300 MG tablet TAKE 1 TABLET(300 MG) BY MOUTH DAILY 90 tablet 4   Apoaequorin (PREVAGEN PO) Take 1 tablet by mouth daily.     aspirin 81 MG EC tablet Take 1 tablet by mouth daily.     brexpiprazole (REXULTI) 1 MG TABS tablet Take 1 mg by mouth daily.     buPROPion (WELLBUTRIN XL) 300 MG 24 hr tablet Take 300 mg by mouth daily.     CALCIUM CITRATE PO Take 1 tablet by mouth daily.     Cholecalciferol (VITAMIN D3) 50 MCG (2000 UT) TABS Take 1 tablet by mouth daily.     COLLAGEN PO Take by mouth.     Cyanocobalamin (VITAMIN B12) 1000 MCG TBCR Take 1 tablet by mouth daily.     diclofenac Sodium (VOLTAREN) 1 % GEL Apply 2 g topically 4 (four) times daily. (Patient taking differently: Apply 2 g topically daily as needed (for pain).) 2 g 3   ferrous sulfate 325 (65 FE) MG tablet Take 325 mg by mouth daily with breakfast.     fluvoxaMINE (LUVOX) 50 MG tablet Take 100 mg by mouth at bedtime.     gabapentin (NEURONTIN) 100 MG capsule Take 100 mg by mouth 2 (two) times daily.     levothyroxine (SYNTHROID, LEVOTHROID) 88 MCG tablet Take 88 mcg by mouth daily before breakfast.  4   Multiple Vitamins-Minerals (WOMENS 50+ MULTI VITAMIN/MIN) TABS Take 1 tablet by mouth daily.     omeprazole (PRILOSEC) 40 MG capsule TAKE 1 CAPSULE(40 MG) BY MOUTH DAILY AFTER BREAKFAST 90 capsule 0   rosuvastatin (CRESTOR) 5 MG tablet TAKE 1 TABLET(5  MG) BY MOUTH DAILY (Patient taking differently: Take 5 mg by mouth daily.) 90 tablet 2  UNABLE TO FIND Take 1 tablet by mouth 3 (three) times daily. Med Name: Metatrim     zinc gluconate 50 MG tablet Take 50 mg by mouth daily.     guaiFENesin (MUCINEX) 600 MG 12 hr tablet Take 600 mg by mouth 2 (two) times daily as needed for to loosen phlegm. (Patient not taking: Reported on 11/17/2022)     No current facility-administered medications for this visit.    REVIEW OF SYSTEMS:   Constitutional: ( - ) fevers, ( - )  chills , ( - ) night sweats Eyes: ( - ) blurriness of vision, ( - ) double vision, ( - ) watery eyes Ears, nose, mouth, throat, and face: ( - ) mucositis, ( - ) sore throat Respiratory: ( - ) cough, ( - ) dyspnea, ( - ) wheezes Cardiovascular: ( - ) palpitation, ( - ) chest discomfort, ( - ) lower extremity swelling Gastrointestinal:  ( - ) nausea, ( - ) heartburn, ( - ) change in bowel habits Skin: ( - ) abnormal skin rashes Lymphatics: ( - ) new lymphadenopathy, ( - ) easy bruising Neurological: ( - ) numbness, ( - ) tingling, ( - ) new weaknesses Behavioral/Psych: ( - ) mood change, ( - ) new changes  All other systems were reviewed with the patient and are negative.  PHYSICAL EXAMINATION:  Vitals:   11/17/22 1505  BP: 123/66  Pulse: 89  Resp: 15  Temp: 98.2 F (36.8 C)  SpO2: 100%     Filed Weights   11/17/22 1505  Weight: 164 lb 11.2 oz (74.7 kg)     GENERAL: well appearing elderly Caucasian female. alert, no distress and comfortable SKIN:  skin color, texture, turgor are normal, no rashes or significant lesions EYES: conjunctiva are pink and non-injected, sclera clear LUNGS: clear to auscultation and percussion with normal breathing effort HEART: regular rate & rhythm and no murmurs and no lower extremity edema Musculoskeletal: no cyanosis of digits and no clubbing  PSYCH: alert & oriented x 3, fluent speech NEURO: no focal motor/sensory  deficits  LABORATORY DATA:  I have reviewed the data as listed    Latest Ref Rng & Units 11/17/2022    2:52 PM 10/13/2022    9:53 AM 09/23/2022    1:15 PM  CBC  WBC 4.0 - 10.5 K/uL 27.7  22.6  31.7   Hemoglobin 12.0 - 15.0 g/dL 11.7  10.7  10.3   Hematocrit 36.0 - 46.0 % 36.9  33.3  31.1   Platelets 150 - 400 K/uL 249  309  201        Latest Ref Rng & Units 11/17/2022    2:52 PM 10/13/2022    9:53 AM 09/23/2022    1:15 PM  CMP  Glucose 70 - 99 mg/dL 114  138  108   BUN 8 - 23 mg/dL 15  17  9    Creatinine 0.44 - 1.00 mg/dL 0.89  0.89  0.81   Sodium 135 - 145 mmol/L 133  132  132   Potassium 3.5 - 5.1 mmol/L 4.2  4.1  3.9   Chloride 98 - 111 mmol/L 101  100  101   CO2 22 - 32 mmol/L 26  26  25    Calcium 8.9 - 10.3 mg/dL 9.4  9.0  9.2   Total Protein 6.5 - 8.1 g/dL 6.2  6.2  6.1   Total Bilirubin 0.3 - 1.2 mg/dL 0.4  0.3  0.3   Alkaline Phos  38 - 126 U/L 81  91  111   AST 15 - 41 U/L 19  17  20    ALT 0 - 44 U/L 16  14  19      RADIOGRAPHIC STUDIES: No results found.  ASSESSMENT & PLAN [name] [name] is a 77 y.o. 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  History Adapted from Dr. Virgie Dad last note:   (1) status post right breast lumpectomy and axillary lymph node dissection in 1997 for a stage I breast cancer, treated with adjuvant radiation and tamoxifen for 5 years   (2) status post right breast lower outer quadrant biopsy 07/13/2013 for a clinical T1c N0, stage IA invasive ductal carcinoma, grade 2, estrogen receptor 100% positive, progesterone receptor 13% positive, with an MIB-1 of 33%, and HER-2 amplification by CISH with a HER2/CEP 17 ratio of 3.19, and an average HER-2 copy number per cell of 4.15   (3) status post right mastectomy 07/28/2013 for a pT1c pN0, stage IA invasive ductal carcinoma, grade 3, with close but negative margins. Prognostic panel was not repeated             (a) the patient  met with Dr. Harlow Mares and has decided against reconstruction   (4) completed weekly paclitaxel x12 12/06/2913, with trastuzumab/ pertuzumab every 3 weeks; pertuzumab was held with third dose on 11/01/2013 due to diarrhea, tried a half dose on cycle 4 again with diarrhea developing   (5) trastuzumab (started 09/20/2013) continued for 1 year, last dose 09/21/2014;             (a) final echocardiogram 09/07/2014 showed an ejection fraction of 55-60%   (6) anastrozole started May 2015; completed April 2020             (a) bone density 12/15/2013 normal             (b) bone density 02/01/2016 at [facility] was normal with a T score of -1.0     OTHER PROBLEMS: (a) History of chronic lymphoid leukemia diagnosed by flow cytometry 06/29/2013, the cells being CD5, CD20 and CD23 positive, CD10 negative.              (1) right renal mass resected on 02/05/15, consisting of atypical lymphoid proliferation composed of monotonous small lymphocytes              (2) Anemia with a normal MCV and normal ferritin-- B-12 and folate normal; stable             (3)  ibrutinib 280 milligrams daily started 03/26/2019, discontinued 06/03/2019 because of concerns regarding dosing             (4) rituximab weekly x8 started 06/15/2019 completed December 2020             (5) maintenance rituximab (Q 2 months) started 10/25/2019, continued through 08/03/2020  (6) 04/29/2022: Started acalabrutinib 100 mg PO twice daily   # CLL Rai Stage I --Due to rapid doubling time of white blood cell count from 28.3 on 10/07/2021 to 66.7 on 04/09/2022, recommendation was to initiate therapy with acalabrutinib 100 mg twice daily, started on 04/29/2022 --Last imaging of the abdomen in 2022 showed no evidence of splenomegaly Plan:  --Labs today show WBC 27.7, hemoglobin 11.7, MCV 93.9, and platelets of 249.  Creatinine and LFTs normal.  --Held acalabrutinib due to worsening head throbbing, however symptoms resolved and patient restarted acalabrutinib  100  mg twice daily. -- If patient were to develop worsening symptoms and intolerance to acalabrutinib would recommend switching therapies to Zanubrutinib 160 mg BID.  --RTC in 4 weeks with labs.   #Head throbbing: --Patient reports throbbing sensation without headaches. Likely secondary to acalabrutinib. After temporarily holding medication, symptoms have improved.  --Advised that headaches respond well to caffeine and trying to drink a caffeinated beverage. --Monitor for now and advised to follow up if symptoms worsen.  # ER + Breast Cancer in Survivorship -- Continue yearly mammograms. --last mammogram on 06/09/2022 --Currently on survivorship.  No orders of the defined types were placed in this encounter.  All questions were answered. The patient knows to call the clinic with any problems, questions or concerns.  I have spent a total of 30 minutes minutes of face-to-face and non-face-to-face time, preparing to see the patient, performing a medically appropriate examination, counseling and educating the patient, ordering tests/procedures, documenting clinical information in the electronic health record, and care coordination.   Ledell Peoples, MD Department of Hematology/Oncology [facility] Phone: [phone] Pager: [phone] Email: [name]   11/17/2022 3:31 PM

## 2022-11-17 ENCOUNTER — Inpatient Hospital Stay: Payer: Medicare Other | Attending: Hematology and Oncology

## 2022-11-17 ENCOUNTER — Inpatient Hospital Stay: Payer: Medicare Other | Admitting: Hematology and Oncology

## 2022-11-17 ENCOUNTER — Other Ambulatory Visit: Payer: Self-pay

## 2022-11-17 VITALS — BP 123/66 | HR 89 | Temp 98.2°F | Resp 15 | Ht 63.0 in | Wt 164.7 lb

## 2022-11-17 DIAGNOSIS — M549 Dorsalgia, unspecified: Secondary | ICD-10-CM | POA: Insufficient documentation

## 2022-11-17 DIAGNOSIS — D649 Anemia, unspecified: Secondary | ICD-10-CM | POA: Insufficient documentation

## 2022-11-17 DIAGNOSIS — Z853 Personal history of malignant neoplasm of breast: Secondary | ICD-10-CM | POA: Diagnosis not present

## 2022-11-17 DIAGNOSIS — R053 Chronic cough: Secondary | ICD-10-CM | POA: Insufficient documentation

## 2022-11-17 DIAGNOSIS — C50511 Malignant neoplasm of lower-outer quadrant of right female breast: Secondary | ICD-10-CM

## 2022-11-17 DIAGNOSIS — G8929 Other chronic pain: Secondary | ICD-10-CM | POA: Diagnosis not present

## 2022-11-17 DIAGNOSIS — Z79899 Other long term (current) drug therapy: Secondary | ICD-10-CM | POA: Insufficient documentation

## 2022-11-17 DIAGNOSIS — Z9011 Acquired absence of right breast and nipple: Secondary | ICD-10-CM | POA: Insufficient documentation

## 2022-11-17 DIAGNOSIS — Z7982 Long term (current) use of aspirin: Secondary | ICD-10-CM | POA: Diagnosis not present

## 2022-11-17 DIAGNOSIS — C9110 Chronic lymphocytic leukemia of B-cell type not having achieved remission: Secondary | ICD-10-CM

## 2022-11-17 DIAGNOSIS — E039 Hypothyroidism, unspecified: Secondary | ICD-10-CM | POA: Insufficient documentation

## 2022-11-17 DIAGNOSIS — Z170 Estrogen receptor positive status [ER+]: Secondary | ICD-10-CM

## 2022-11-17 LAB — CBC WITH DIFFERENTIAL (CANCER CENTER ONLY)
Abs Immature Granulocytes: 0.07 10*3/uL (ref 0.00–0.07)
Basophils Absolute: 0.1 10*3/uL (ref 0.0–0.1)
Basophils Relative: 1 %
Eosinophils Absolute: 0.4 10*3/uL (ref 0.0–0.5)
Eosinophils Relative: 2 %
HCT: 36.9 % (ref 36.0–46.0)
Hemoglobin: 11.7 g/dL — ABNORMAL LOW (ref 12.0–15.0)
Immature Granulocytes: 0 %
Lymphocytes Relative: 75 %
Lymphs Abs: 21 10*3/uL — ABNORMAL HIGH (ref 0.7–4.0)
MCH: 29.8 pg (ref 26.0–34.0)
MCHC: 31.7 g/dL (ref 30.0–36.0)
MCV: 93.9 fL (ref 80.0–100.0)
Monocytes Absolute: 0.7 10*3/uL (ref 0.1–1.0)
Monocytes Relative: 3 %
Neutro Abs: 5.3 10*3/uL (ref 1.7–7.7)
Neutrophils Relative %: 19 %
Platelet Count: 249 10*3/uL (ref 150–400)
RBC: 3.93 MIL/uL (ref 3.87–5.11)
RDW: 13.7 % (ref 11.5–15.5)
Smear Review: NORMAL
WBC Count: 27.7 10*3/uL — ABNORMAL HIGH (ref 4.0–10.5)
nRBC: 0 % (ref 0.0–0.2)

## 2022-11-17 LAB — CMP (CANCER CENTER ONLY)
ALT: 16 U/L (ref 0–44)
AST: 19 U/L (ref 15–41)
Albumin: 4.5 g/dL (ref 3.5–5.0)
Alkaline Phosphatase: 81 U/L (ref 38–126)
Anion gap: 6 (ref 5–15)
BUN: 15 mg/dL (ref 8–23)
CO2: 26 mmol/L (ref 22–32)
Calcium: 9.4 mg/dL (ref 8.9–10.3)
Chloride: 101 mmol/L (ref 98–111)
Creatinine: 0.89 mg/dL (ref 0.44–1.00)
GFR, Estimated: >60 mL/min (ref >60)
Glucose, Bld: 114 mg/dL — ABNORMAL HIGH (ref 70–99)
Potassium: 4.2 mmol/L (ref 3.5–5.1)
Sodium: 133 mmol/L — ABNORMAL LOW (ref 135–145)
Total Bilirubin: 0.4 mg/dL (ref 0.3–1.2)
Total Protein: 6.2 g/dL — ABNORMAL LOW (ref 6.5–8.1)

## 2022-11-17 LAB — LACTATE DEHYDROGENASE: LDH: 127 U/L (ref 98–192)

## 2022-11-25 ENCOUNTER — Other Ambulatory Visit (HOSPITAL_COMMUNITY): Payer: Self-pay

## 2022-11-26 DIAGNOSIS — E039 Hypothyroidism, unspecified: Secondary | ICD-10-CM | POA: Diagnosis not present

## 2022-11-28 ENCOUNTER — Other Ambulatory Visit (HOSPITAL_COMMUNITY): Payer: Self-pay

## 2022-11-28 ENCOUNTER — Other Ambulatory Visit: Payer: Self-pay

## 2022-12-02 DIAGNOSIS — D171 Benign lipomatous neoplasm of skin and subcutaneous tissue of trunk: Secondary | ICD-10-CM | POA: Diagnosis not present

## 2022-12-09 ENCOUNTER — Other Ambulatory Visit: Payer: Self-pay

## 2022-12-15 ENCOUNTER — Other Ambulatory Visit: Payer: Self-pay | Admitting: Physician Assistant

## 2022-12-16 ENCOUNTER — Other Ambulatory Visit: Payer: Self-pay

## 2022-12-16 ENCOUNTER — Inpatient Hospital Stay: Payer: Medicare Other | Admitting: Hematology and Oncology

## 2022-12-16 ENCOUNTER — Inpatient Hospital Stay: Payer: Medicare Other | Attending: Hematology and Oncology

## 2022-12-16 ENCOUNTER — Other Ambulatory Visit (HOSPITAL_COMMUNITY): Payer: Self-pay

## 2022-12-16 VITALS — BP 126/55 | HR 89 | Temp 98.9°F | Resp 18 | Ht 63.0 in | Wt 171.0 lb

## 2022-12-16 DIAGNOSIS — Z8042 Family history of malignant neoplasm of prostate: Secondary | ICD-10-CM | POA: Insufficient documentation

## 2022-12-16 DIAGNOSIS — C9110 Chronic lymphocytic leukemia of B-cell type not having achieved remission: Secondary | ICD-10-CM

## 2022-12-16 DIAGNOSIS — Z853 Personal history of malignant neoplasm of breast: Secondary | ICD-10-CM | POA: Diagnosis not present

## 2022-12-16 DIAGNOSIS — Z90710 Acquired absence of both cervix and uterus: Secondary | ICD-10-CM | POA: Diagnosis not present

## 2022-12-16 DIAGNOSIS — Z170 Estrogen receptor positive status [ER+]: Secondary | ICD-10-CM | POA: Diagnosis not present

## 2022-12-16 DIAGNOSIS — C50511 Malignant neoplasm of lower-outer quadrant of right female breast: Secondary | ICD-10-CM | POA: Diagnosis not present

## 2022-12-16 DIAGNOSIS — Z90722 Acquired absence of ovaries, bilateral: Secondary | ICD-10-CM | POA: Insufficient documentation

## 2022-12-16 DIAGNOSIS — Z9011 Acquired absence of right breast and nipple: Secondary | ICD-10-CM | POA: Insufficient documentation

## 2022-12-16 DIAGNOSIS — Z800 Family history of malignant neoplasm of digestive organs: Secondary | ICD-10-CM | POA: Insufficient documentation

## 2022-12-16 DIAGNOSIS — Z808 Family history of malignant neoplasm of other organs or systems: Secondary | ICD-10-CM | POA: Diagnosis not present

## 2022-12-16 LAB — CBC WITH DIFFERENTIAL (CANCER CENTER ONLY)
Abs Immature Granulocytes: 0.04 10*3/uL (ref 0.00–0.07)
Basophils Absolute: 0.1 10*3/uL (ref 0.0–0.1)
Basophils Relative: 0 %
Eosinophils Absolute: 0.4 10*3/uL (ref 0.0–0.5)
Eosinophils Relative: 1 %
HCT: 36.8 % (ref 36.0–46.0)
Hemoglobin: 12.1 g/dL (ref 12.0–15.0)
Immature Granulocytes: 0 %
Lymphocytes Relative: 77 %
Lymphs Abs: 19 10*3/uL — ABNORMAL HIGH (ref 0.7–4.0)
MCH: 30.3 pg (ref 26.0–34.0)
MCHC: 32.9 g/dL (ref 30.0–36.0)
MCV: 92 fL (ref 80.0–100.0)
Monocytes Absolute: 0.8 10*3/uL (ref 0.1–1.0)
Monocytes Relative: 3 %
Neutro Abs: 4.8 10*3/uL (ref 1.7–7.7)
Neutrophils Relative %: 19 %
Platelet Count: 234 10*3/uL (ref 150–400)
RBC: 4 MIL/uL (ref 3.87–5.11)
RDW: 14.2 % (ref 11.5–15.5)
Smear Review: NORMAL
WBC Count: 25 10*3/uL — ABNORMAL HIGH (ref 4.0–10.5)
nRBC: 0 % (ref 0.0–0.2)

## 2022-12-16 LAB — CMP (CANCER CENTER ONLY)
ALT: 18 U/L (ref 0–44)
AST: 20 U/L (ref 15–41)
Albumin: 4.6 g/dL (ref 3.5–5.0)
Alkaline Phosphatase: 86 U/L (ref 38–126)
Anion gap: 7 (ref 5–15)
BUN: 18 mg/dL (ref 8–23)
CO2: 24 mmol/L (ref 22–32)
Calcium: 9.5 mg/dL (ref 8.9–10.3)
Chloride: 95 mmol/L — ABNORMAL LOW (ref 98–111)
Creatinine: 1.03 mg/dL — ABNORMAL HIGH (ref 0.44–1.00)
GFR, Estimated: 56 mL/min — ABNORMAL LOW (ref >60)
Glucose, Bld: 94 mg/dL (ref 70–99)
Potassium: 4.7 mmol/L (ref 3.5–5.1)
Sodium: 126 mmol/L — ABNORMAL LOW (ref 135–145)
Total Bilirubin: 0.3 mg/dL (ref 0.3–1.2)
Total Protein: 6.8 g/dL (ref 6.5–8.1)

## 2022-12-16 LAB — LACTATE DEHYDROGENASE: LDH: 137 U/L (ref 98–192)

## 2022-12-16 NOTE — Progress Notes (Signed)
[facility] Telephone:([phone]) [phone]   Fax:(336) [phone]  PROGRESS NOTE  Patient Care Team: Merri Brunette, MD as PCP - General (Internal Medicine) Jake Bathe, MD as PCP - Cardiology (Cardiology) Rachael Fee, MD as Attending Physician (Gastroenterology) Jerene Bears, MD as Consulting Physician (Gynecology) Manus Rudd, MD as Consulting Physician (General Surgery) Ellis Savage, NP as Nurse Practitioner Samson Frederic, MD as Consulting Physician (Orthopedic Surgery) Ranee Gosselin, MD as Consulting Physician (Orthopedic Surgery) Elenora Fender, MD as Attending Physician (Radiology) Jeronimo Norma, MD as Referring Physician (Orthopedic Surgery) Jaci Standard, MD as Consulting Physician (Hematology and Oncology)  Hematological/Oncological History # CLL Rai Stage 1  06/04/2021: last visit with Dr. Darnelle Catalan. Detailed history of his care history noted below.  09/18/2021: WBC 43.3, Hgb 13.0, MCV 87.1, Plt 275 09/23/2021: establish care with Dr. Leonides Schanz. WBC 30.1, Hgb 12.3, MCV 87.8, Plt 241 03/11/2022: WBC 57.4, Hgb 11.2, MCV 88.3, Plt 181  04/29/2022: Started acalabrutinib 100 mg PO twice daily  Interval History:  [name] [name] 77 y.o. 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 The patient's last visit was on 11/17/2022. In the interim since the last visit she has restarted the acalabrutinib.  On exam today [name] [name] is accompanied by her daughter.  She reports she has been pretty good overall in the interim since her last visit.  She reports he does tire out a lot and she does have worsening problems with her back pain.  She will be visiting Dr. Dutch Quint on Thursday to have this reevaluated.  She notes that her surgery had been delayed due to RSV infection.  She continues to have some symptoms from her RSV infection including coughing and some trouble sleeping at night.  She reports  that she is also afraid that she may have developed allergies, particular to her cat.  She does that she does not go out much.  She does desire to lose weight as her weight has increased from 157 pounds to 171 pounds over the last 2 months.  She reports that she is not having any fevers, chills, sweats, nausea vomiting or diarrhea.  She denies any palpitations and reports that the pounding in her head has not been present over the last several weeks.  Otherwise she has been at her baseline level of health.  She has no other symptoms including fevers, chills, sweats, shortness of breath or chest pain.She has no other complaints. Rest of 10 point ROS is listed below.  At this time she is willing and able to proceed with [place] p.o. therapy.   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   DDD (degenerative disc disease)    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 ear    Depression    Diverticulosis    Elevated LFTs    Fibromyalgia 1978   GERD (gastroesophageal reflux disease)    Hematuria    History of syncope    episode 2008--  no recurrence since   HLD (hyperlipidemia)    Hypothyroidism    Kidney stones 2014  Plantar fasciitis    Ruptured disk    one in neck and two in back   S/P chemotherapy, time since greater than 12 weeks    Sinus tachycardia    mild resting   Skin cancer    nose   Stool incontinence    "at times recently"     SURGICAL HISTORY: Past Surgical History:  Procedure Laterality Date   ANTERIOR LATERAL LUMBAR FUSION 4 LEVELS Left 04/25/2016   Procedure: LEFT LUMBAR ONE-TWO, LUMBAR TWO-THREE, LUMBAR THREE-FOUR, LUMBAR FOUR-FIVE ANTERIOR LATERAL LUMBAR FUSION;  Surgeon: Julio Sicks, MD;  Location: [facility];  Service: Neurosurgery;  Laterality: Left;   APPENDECTOMY  1997   APPLICATION OF ROBOTIC ASSISTANCE FOR SPINAL PROCEDURE N/A 04/06/2017   Procedure: APPLICATION OF ROBOTIC ASSISTANCE FOR SPINAL PROCEDURE;  Surgeon: Julio Sicks, MD;  Location: [facility] OR;  Service: Neurosurgery;  Laterality: N/A;   BREAST BIOPSY Right 2014   BREAST LUMPECTOMY Right 1997   BREAST LUMPECTOMY WITH AXILLARY LYMPH NODE DISSECTION Right 1997   CATARACT EXTRACTION, BILATERAL  2019   Dr. Alben Spittle   CYSTOSCOPY WITH RETROGRADE PYELOGRAM, URETEROSCOPY AND STENT PLACEMENT Bilateral 06/17/2013   Procedure: CYSTOSCOPY WITH RETROGRADE PYELOGRAM, URETEROSCOPY AND LEFT DOUBLE  J STENT PLACEMENT RIGHT URETERAL HOLMIIUM LASER AND DOUBLE J STENT ;  Surgeon: Lindaann Slough, MD;  Location: [facility];  Service: Urology;  Laterality: Bilateral;   HOLMIUM LASER APPLICATION Bilateral 06/17/2013   Procedure: HOLMIUM LASER APPLICATION;  Surgeon: Lindaann Slough, MD;  Location: [facility];  Service: Urology;  Laterality: Bilateral;   LITHOTRIPSY Left 06/2013   LUMBAR EPIDURAL INJECTION     has had 7 injections   PORT-A-CATH REMOVAL Left 10/03/2014   Procedure: REMOVAL PORT-A-CATH;  Surgeon: Manus Rudd, MD;  Location: [facility];  Service: General;  Laterality: Left;   PORTACATH PLACEMENT Left 07/28/2013   Procedure: ATTEMPTED INSERTION PORT-A-CATH;  Surgeon: Wilmon Arms. Corliss Skains, MD;  Location: [facility] OR;  Service: General;  Laterality: Left;   POSTERIOR LUMBAR FUSION 4 LEVEL N/A 04/25/2016   Procedure: LUMBAR FIVE-SACRAL ONE POSTERIOR LUMBAR INTERBODY FUSION, THORACIC NINE-SACRAL ONE POSTERIOR LATERAL ARTHRODESIS WITH PEDICLE SCREWS;  Surgeon: Julio Sicks, MD;  Location: [facility];  Service: Neurosurgery;  Laterality: N/A;   RETINAL DETACHMENT SURGERY Right 2013   ROBOTIC ASSITED PARTIAL NEPHRECTOMY Right 02/05/2015   Procedure: ROBOTIC ASSITED PARTIAL NEPHRECTOMY;   Surgeon: Heloise Purpura, MD;  Location: [facility];  Service: Urology;  Laterality: Right;   SIMPLE MASTECTOMY WITH AXILLARY SENTINEL NODE BIOPSY Right 07/28/2013   Procedure: RIGHT TOTAL  MASTECTOMY;  Surgeon: Wilmon Arms. Corliss Skains, MD;  Location: [facility];  Service: General;  Laterality: Right;   TONSILLECTOMY  AGE 43   TOTAL ABDOMINAL HYSTERECTOMY W/ BILATERAL SALPINGOOPHORECTOMY  1997    SOCIAL HISTORY: Social History   Socioeconomic History   Marital status: Married    Spouse name: Philip   Number of children: 1   Years of education: Not on file   Highest education level: Not on file  Occupational History   Occupation: homemaker  Tobacco Use   Smoking status: Never   Smokeless tobacco: Never  Vaping Use   Vaping Use: Never used  Substance and Sexual Activity   Alcohol use: No   Drug use: No   Sexual activity: Not Currently    Partners: Male    Birth control/protection: Surgical    Comment: TAH/BSO  Other Topics Concern   Not on file  Social History Narrative  Not on file   Social Determinants of Health   Financial Resource Strain: Not on file  Food Insecurity: No Food Insecurity (09/03/2022)   Hunger Vital Sign    Worried About Running Out of Food in the Last Year: Never true    Ran Out of Food in the Last Year: Never true  Transportation Needs: No Transportation Needs (09/03/2022)   PRAPARE - Administrator, Civil Service (Medical): No    Lack of Transportation (Non-Medical): No  Physical Activity: Not on file  Stress: Not on file  Social Connections: Not on file  Intimate Partner Violence: Not At Risk (09/03/2022)   Humiliation, Afraid, Rape, and Kick questionnaire    Fear of Current or Ex-Partner: No    Emotionally Abused: No    Physically Abused: No    Sexually Abused: No    FAMILY HISTORY: Family History  Problem Relation Age of Onset   Heart disease Maternal Grandfather    Diabetes Maternal Grandfather    Colon cancer Maternal Aunt 46   Brain  cancer Paternal Grandmother        dx in 46s   Dementia Mother    Diabetes Mother    Osteoporosis Mother    Diabetes Maternal Aunt    Prostate cancer Father 47   Bipolar disorder Maternal Aunt    Stroke Maternal Aunt    Stomach cancer Paternal Uncle        dx in late 65s    ALLERGIES:  is allergic to lasix [furosemide], lithium, sulfa antibiotics, trazodone and nefazodone, and lyrica [pregabalin].  MEDICATIONS:  Current Outpatient Medications  Medication Sig Dispense Refill   acalabrutinib maleate (CALQUENCE) 100 MG tablet Take 1 tablet (100 mg total) by mouth 2 (two) times daily. 60 tablet 2   allopurinol (ZYLOPRIM) 300 MG tablet TAKE 1 TABLET(300 MG) BY MOUTH DAILY 90 tablet 4   Apoaequorin (PREVAGEN PO) Take 1 tablet by mouth daily.     aspirin 81 MG EC tablet Take 1 tablet by mouth daily.     brexpiprazole (REXULTI) 1 MG TABS tablet Take 1 mg by mouth daily.     buPROPion (WELLBUTRIN XL) 300 MG 24 hr tablet Take 300 mg by mouth daily.     CALCIUM CITRATE PO Take 1 tablet by mouth daily.     Cholecalciferol (VITAMIN D3) 50 MCG (2000 UT) TABS Take 1 tablet by mouth daily.     COLLAGEN PO Take by mouth.     Cyanocobalamin (VITAMIN B12) 1000 MCG TBCR Take 1 tablet by mouth daily.     diclofenac Sodium (VOLTAREN) 1 % GEL Apply 2 g topically 4 (four) times daily. (Patient taking differently: Apply 2 g topically daily as needed (for pain).) 2 g 3   ferrous sulfate 325 (65 FE) MG tablet Take 325 mg by mouth daily with breakfast.     fluvoxaMINE (LUVOX) 50 MG tablet Take 100 mg by mouth at bedtime.     gabapentin (NEURONTIN) 100 MG capsule Take 100 mg by mouth 2 (two) times daily.     levothyroxine (SYNTHROID, LEVOTHROID) 88 MCG tablet Take 88 mcg by mouth daily before breakfast.  4   Multiple Vitamins-Minerals (WOMENS 50+ MULTI VITAMIN/MIN) TABS Take 1 tablet by mouth daily.     omeprazole (PRILOSEC) 40 MG capsule TAKE 1 CAPSULE(40 MG) BY MOUTH DAILY AFTER BREAKFAST 90 capsule 0    rosuvastatin (CRESTOR) 5 MG tablet TAKE 1 TABLET(5 MG) BY MOUTH DAILY (Patient taking differently: Take 5 mg by  mouth daily.) 90 tablet 2   UNABLE TO FIND Take 1 tablet by mouth 3 (three) times daily. Med Name: Metatrim     zinc gluconate 50 MG tablet Take 50 mg by mouth daily.     No current facility-administered medications for this visit.    REVIEW OF SYSTEMS:   Constitutional: ( - ) fevers, ( - )  chills , ( - ) night sweats Eyes: ( - ) blurriness of vision, ( - ) double vision, ( - ) watery eyes Ears, nose, mouth, throat, and face: ( - ) mucositis, ( - ) sore throat Respiratory: ( - ) cough, ( - ) dyspnea, ( - ) wheezes Cardiovascular: ( - ) palpitation, ( - ) chest discomfort, ( - ) lower extremity swelling Gastrointestinal:  ( - ) nausea, ( - ) heartburn, ( - ) change in bowel habits Skin: ( - ) abnormal skin rashes Lymphatics: ( - ) new lymphadenopathy, ( - ) easy bruising Neurological: ( - ) numbness, ( - ) tingling, ( - ) new weaknesses Behavioral/Psych: ( - ) mood change, ( - ) new changes  All other systems were reviewed with the patient and are negative.  PHYSICAL EXAMINATION:  Vitals:   12/16/22 1310  BP: (!) 126/55  Pulse: 89  Resp: 18  Temp: 98.9 F (37.2 C)  SpO2: 98%      Filed Weights   12/16/22 1310  Weight: 171 lb (77.6 kg)      GENERAL: well appearing elderly Caucasian female. alert, no distress and comfortable SKIN:  skin color, texture, turgor are normal, no rashes or significant lesions EYES: conjunctiva are pink and non-injected, sclera clear LUNGS: clear to auscultation and percussion with normal breathing effort HEART: regular rate & rhythm and no murmurs and no lower extremity edema Musculoskeletal: no cyanosis of digits and no clubbing  PSYCH: alert & oriented x 3, fluent speech NEURO: no focal motor/sensory deficits  LABORATORY DATA:  I have reviewed the data as listed    Latest Ref Rng & Units 12/16/2022   12:39 PM 11/17/2022     2:52 PM 10/13/2022    9:53 AM  CBC  WBC 4.0 - 10.5 K/uL 25.0  27.7  22.6   Hemoglobin 12.0 - 15.0 g/dL 16.1  09.6  04.5   Hematocrit 36.0 - 46.0 % 36.8  36.9  33.3   Platelets 150 - 400 K/uL 234  249  309        Latest Ref Rng & Units 12/16/2022   12:39 PM 11/17/2022    2:52 PM 10/13/2022    9:53 AM  CMP  Glucose 70 - 99 mg/dL 94  409  811   BUN 8 - 23 mg/dL Creatinine 0.44 - 1.00 mg/dL 9.14  7.82  9.56   Sodium 135 - 145 mmol/L 126  133  132   Potassium 3.5 - 5.1 mmol/L 4.7  4.2  4.1   Chloride 98 - 111 mmol/L 95  101  100   CO2 22 - 32 mmol/L Calcium 8.9 - 10.3 mg/dL 9.5  9.4  9.0   Total Protein 6.5 - 8.1 g/dL 6.8  6.2  6.2   Total Bilirubin 0.3 - 1.2 mg/dL 0.3  0.4  0.3   Alkaline Phos 38 - 126 U/L 86  81  91   AST 15 - 41 U/L ALT 0 -  44 U/L 18  16  14      RADIOGRAPHIC STUDIES: No results found.  ASSESSMENT & PLAN [name] [name] is a 77 y.o. 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  History Adapted from Dr. Darrall Dears last note:   (1) status post right breast lumpectomy and axillary lymph node dissection in 1997 for a stage I breast cancer, treated with adjuvant radiation and tamoxifen for 5 years   (2) status post right breast lower outer quadrant biopsy 07/13/2013 for a clinical T1c N0, stage IA invasive ductal carcinoma, grade 2, estrogen receptor 100% positive, progesterone receptor 13% positive, with an MIB-1 of 33%, and HER-2 amplification by CISH with a HER2/CEP 17 ratio of 3.19, and an average HER-2 copy number per cell of 4.15   (3) status post right mastectomy 07/28/2013 for a pT1c pN0, stage IA invasive ductal carcinoma, grade 3, with close but negative margins. Prognostic panel was not repeated             (a) the patient met with Dr. Odis Luster and has decided against reconstruction   (4) completed weekly paclitaxel x12 12/06/2913, with trastuzumab/  pertuzumab every 3 weeks; pertuzumab was held with third dose on 11/01/2013 due to diarrhea, tried a half dose on cycle 4 again with diarrhea developing   (5) trastuzumab (started 09/20/2013) continued for 1 year, last dose 09/21/2014;             (a) final echocardiogram 09/07/2014 showed an ejection fraction of 55-60%   (6) anastrozole started May 2015; completed April 2020             (a) bone density 12/15/2013 normal             (b) bone density 02/01/2016 at [facility] was normal with a T score of -1.0     OTHER PROBLEMS: (a) History of chronic lymphoid leukemia diagnosed by flow cytometry 06/29/2013, the cells being CD5, CD20 and CD23 positive, CD10 negative.              (1) right renal mass resected on 02/05/15, consisting of atypical lymphoid proliferation composed of monotonous small lymphocytes              (2) Anemia with a normal MCV and normal ferritin-- B-12 and folate normal; stable             (3)  ibrutinib 280 milligrams daily started 03/26/2019, discontinued 06/03/2019 because of concerns regarding dosing             (4) rituximab weekly x8 started 06/15/2019 completed December 2020             (5) maintenance rituximab (Q 2 months) started 10/25/2019, continued through 08/03/2020  (6) 04/29/2022: Started acalabrutinib 100 mg PO twice daily   # CLL Rai Stage I --Due to rapid doubling time of white blood cell count from 28.3 on 10/07/2021 to 66.7 on 04/09/2022, recommendation was to initiate therapy with acalabrutinib 100 mg twice daily, started on 04/29/2022 --Last imaging of the abdomen in 2022 showed no evidence of splenomegaly Plan:  --Labs today show WBC 25.0, hemoglobin 12.1, MCV 92, and platelets of 234.  Creatinine and LFTs normal.  --continue acalabrutinib 100 mg twice daily. -- If patient were to develop worsening symptoms and intolerance to acalabrutinib would recommend switching therapies to Zanubrutinib 160 mg BID.  --RTC in 4 weeks with labs.   #Head  throbbing-resolved.  --Patient reports  throbbing sensation without headaches. Likely secondary to acalabrutinib. After temporarily holding medication, symptoms have improved.  --Advised that headaches respond well to caffeine and trying to drink a caffeinated beverage. --Monitor for now and advised to follow up if symptoms worsen.  # ER + Breast Cancer in Survivorship -- Continue yearly mammograms. --last mammogram on 06/09/2022 --Currently on survivorship.  No orders of the defined types were placed in this encounter.  All questions were answered. The patient knows to call the clinic with any problems, questions or concerns.  I have spent a total of 30 minutes minutes of face-to-face and non-face-to-face time, preparing to see the patient, performing a medically appropriate examination, counseling and educating the patient, ordering tests/procedures, documenting clinical information in the electronic health record, and care coordination.   Ulysees Barns, MD Department of Hematology/Oncology [facility] Phone: [phone] Pager: [phone] Email: [email or web address]   12/16/2022 2:03 PM

## 2022-12-17 ENCOUNTER — Ambulatory Visit: Payer: Medicare Other | Admitting: Gastroenterology

## 2022-12-17 ENCOUNTER — Other Ambulatory Visit: Payer: Self-pay | Admitting: Cardiology

## 2022-12-17 MED ORDER — ROSUVASTATIN CALCIUM 5 MG PO TABS: 5.0000 mg | ORAL_TABLET | Freq: Every day | ORAL | 8 refills | Status: DC

## 2022-12-17 NOTE — Addendum Note (Signed)
Addended by: Mariam Dollar on: 12/17/2022 01:58 PM   Modules accepted: Orders

## 2022-12-18 ENCOUNTER — Telehealth: Payer: Self-pay | Admitting: *Deleted

## 2022-12-18 DIAGNOSIS — M5414 Radiculopathy, thoracic region: Secondary | ICD-10-CM | POA: Diagnosis not present

## 2022-12-18 DIAGNOSIS — Z6830 Body mass index (BMI) 30.0-30.9, adult: Secondary | ICD-10-CM | POA: Diagnosis not present

## 2022-12-18 NOTE — Telephone Encounter (Signed)
   Pre-operative Risk Assessment    Patient Name: [name] [name]  DOB: 11-22-1945 MRN: [id number]      Request for Surgical Clearance    Procedure:   T - 6 - T9 THORACIC POSTERIOR FUSION  Date of Surgery:  Clearance TBD                                 Surgeon:  Temple Pacini,, MD Surgeon's Group or Practice Name:  [facility] Phone number:  [phone] Fax number:  [phone]   Type of Clearance Requested:   - Medical  - Pharmacy:  Hold Aspirin NOT INDICATED HOW LONG   Type of Anesthesia:  General    Additional requests/questions:    Wilhemina Cash   12/18/2022, 4:26 PM

## 2022-12-18 NOTE — Telephone Encounter (Signed)
Per Dr. Leonides Schanz, pt's NA+ leverls need to be addressed by her PCP.  TCT patient and spoke with her. Advised her of Dr. Derek Mound recommendation. Will forward labs to Dr. Renne Crigler. Pt voiced understanding.

## 2022-12-18 NOTE — Telephone Encounter (Signed)
Received vm message from pt. She is concerned with one of her labs from 12/16/22. She noted her NA+ to be low at 126. She is concerned that if this gets lower she will have problems with getting confused and  'delirious' and being hospitalized.  Please advise.

## 2022-12-19 ENCOUNTER — Other Ambulatory Visit: Payer: Self-pay

## 2022-12-19 ENCOUNTER — Encounter (HOSPITAL_BASED_OUTPATIENT_CLINIC_OR_DEPARTMENT_OTHER): Payer: Self-pay | Admitting: Emergency Medicine

## 2022-12-19 ENCOUNTER — Inpatient Hospital Stay (HOSPITAL_BASED_OUTPATIENT_CLINIC_OR_DEPARTMENT_OTHER)
Admission: EM | Admit: 2022-12-19 | Discharge: 2022-12-21 | DRG: 640 | Disposition: A | Discharge status: Home / Self Care | Payer: Medicare Other | Attending: Internal Medicine | Admitting: Internal Medicine

## 2022-12-19 DIAGNOSIS — Z90722 Acquired absence of ovaries, bilateral: Secondary | ICD-10-CM

## 2022-12-19 DIAGNOSIS — Z853 Personal history of malignant neoplasm of breast: Secondary | ICD-10-CM

## 2022-12-19 DIAGNOSIS — Z7982 Long term (current) use of aspirin: Secondary | ICD-10-CM | POA: Diagnosis not present

## 2022-12-19 DIAGNOSIS — F32A Depression, unspecified: Secondary | ICD-10-CM | POA: Diagnosis present

## 2022-12-19 DIAGNOSIS — Z8701 Personal history of pneumonia (recurrent): Secondary | ICD-10-CM

## 2022-12-19 DIAGNOSIS — G629 Polyneuropathy, unspecified: Secondary | ICD-10-CM | POA: Diagnosis present

## 2022-12-19 DIAGNOSIS — Z888 Allergy status to other drugs, medicaments and biological substances status: Secondary | ICD-10-CM

## 2022-12-19 DIAGNOSIS — Z8249 Family history of ischemic heart disease and other diseases of the circulatory system: Secondary | ICD-10-CM

## 2022-12-19 DIAGNOSIS — E871 Hypo-osmolality and hyponatremia: Principal | ICD-10-CM | POA: Diagnosis present

## 2022-12-19 DIAGNOSIS — Z9221 Personal history of antineoplastic chemotherapy: Secondary | ICD-10-CM

## 2022-12-19 DIAGNOSIS — E669 Obesity, unspecified: Secondary | ICD-10-CM | POA: Diagnosis present

## 2022-12-19 DIAGNOSIS — Z79899 Other long term (current) drug therapy: Secondary | ICD-10-CM

## 2022-12-19 DIAGNOSIS — H9191 Unspecified hearing loss, right ear: Secondary | ICD-10-CM | POA: Diagnosis present

## 2022-12-19 DIAGNOSIS — G9340 Encephalopathy, unspecified: Secondary | ICD-10-CM | POA: Diagnosis present

## 2022-12-19 DIAGNOSIS — M797 Fibromyalgia: Secondary | ICD-10-CM | POA: Diagnosis present

## 2022-12-19 DIAGNOSIS — G9341 Metabolic encephalopathy: Secondary | ICD-10-CM | POA: Diagnosis not present

## 2022-12-19 DIAGNOSIS — Z808 Family history of malignant neoplasm of other organs or systems: Secondary | ICD-10-CM | POA: Diagnosis not present

## 2022-12-19 DIAGNOSIS — R9431 Abnormal electrocardiogram [ECG] [EKG]: Secondary | ICD-10-CM | POA: Diagnosis not present

## 2022-12-19 DIAGNOSIS — Z981 Arthrodesis status: Secondary | ICD-10-CM

## 2022-12-19 DIAGNOSIS — F419 Anxiety disorder, unspecified: Secondary | ICD-10-CM | POA: Diagnosis present

## 2022-12-19 DIAGNOSIS — Z882 Allergy status to sulfonamides status: Secondary | ICD-10-CM

## 2022-12-19 DIAGNOSIS — E860 Dehydration: Secondary | ICD-10-CM | POA: Diagnosis not present

## 2022-12-19 DIAGNOSIS — E7800 Pure hypercholesterolemia, unspecified: Secondary | ICD-10-CM | POA: Diagnosis not present

## 2022-12-19 DIAGNOSIS — Z833 Family history of diabetes mellitus: Secondary | ICD-10-CM | POA: Diagnosis not present

## 2022-12-19 DIAGNOSIS — Z8042 Family history of malignant neoplasm of prostate: Secondary | ICD-10-CM | POA: Diagnosis not present

## 2022-12-19 DIAGNOSIS — Z90710 Acquired absence of both cervix and uterus: Secondary | ICD-10-CM | POA: Diagnosis not present

## 2022-12-19 DIAGNOSIS — Z8262 Family history of osteoporosis: Secondary | ICD-10-CM

## 2022-12-19 DIAGNOSIS — M479 Spondylosis, unspecified: Secondary | ICD-10-CM | POA: Diagnosis not present

## 2022-12-19 DIAGNOSIS — Z85828 Personal history of other malignant neoplasm of skin: Secondary | ICD-10-CM

## 2022-12-19 DIAGNOSIS — I10 Essential (primary) hypertension: Secondary | ICD-10-CM | POA: Diagnosis not present

## 2022-12-19 DIAGNOSIS — Z823 Family history of stroke: Secondary | ICD-10-CM

## 2022-12-19 DIAGNOSIS — E785 Hyperlipidemia, unspecified: Secondary | ICD-10-CM | POA: Diagnosis present

## 2022-12-19 DIAGNOSIS — C9110 Chronic lymphocytic leukemia of B-cell type not having achieved remission: Secondary | ICD-10-CM | POA: Diagnosis not present

## 2022-12-19 DIAGNOSIS — Z6831 Body mass index (BMI) 31.0-31.9, adult: Secondary | ICD-10-CM | POA: Diagnosis not present

## 2022-12-19 DIAGNOSIS — K219 Gastro-esophageal reflux disease without esophagitis: Secondary | ICD-10-CM | POA: Diagnosis present

## 2022-12-19 DIAGNOSIS — Z170 Estrogen receptor positive status [ER+]: Secondary | ICD-10-CM

## 2022-12-19 DIAGNOSIS — Z87442 Personal history of urinary calculi: Secondary | ICD-10-CM | POA: Diagnosis not present

## 2022-12-19 DIAGNOSIS — Z79890 Hormone replacement therapy (postmenopausal): Secondary | ICD-10-CM

## 2022-12-19 DIAGNOSIS — C50511 Malignant neoplasm of lower-outer quadrant of right female breast: Secondary | ICD-10-CM

## 2022-12-19 DIAGNOSIS — E039 Hypothyroidism, unspecified: Secondary | ICD-10-CM | POA: Diagnosis not present

## 2022-12-19 DIAGNOSIS — Z905 Acquired absence of kidney: Secondary | ICD-10-CM

## 2022-12-19 DIAGNOSIS — Z800 Family history of malignant neoplasm of digestive organs: Secondary | ICD-10-CM | POA: Diagnosis not present

## 2022-12-19 LAB — BASIC METABOLIC PANEL
Anion gap: 11 (ref 5–15)
Anion gap: 11 (ref 5–15)
BUN: 13 mg/dL (ref 8–23)
BUN: 12 mg/dL (ref 8–23)
CO2: 21 mmol/L — ABNORMAL LOW (ref 22–32)
CO2: 18 mmol/L — ABNORMAL LOW (ref 22–32)
Calcium: 9.7 mg/dL (ref 8.9–10.3)
Calcium: 9 mg/dL (ref 8.9–10.3)
Chloride: 92 mmol/L — ABNORMAL LOW (ref 98–111)
Chloride: 94 mmol/L — ABNORMAL LOW (ref 98–111)
Creatinine, Ser: 0.88 mg/dL (ref 0.44–1.00)
Creatinine, Ser: 0.8 mg/dL (ref 0.44–1.00)
GFR, Estimated: >60 mL/min (ref >60)
GFR, Estimated: >60 mL/min (ref >60)
Glucose, Bld: 83 mg/dL (ref 70–99)
Glucose, Bld: 82 mg/dL (ref 70–99)
Potassium: 4.7 mmol/L (ref 3.5–5.1)
Potassium: 4.7 mmol/L (ref 3.5–5.1)
Sodium: 124 mmol/L — ABNORMAL LOW (ref 135–145)
Sodium: 123 mmol/L — ABNORMAL LOW (ref 135–145)

## 2022-12-19 LAB — CBC
HCT: 34.1 % — ABNORMAL LOW (ref 36.0–46.0)
Hemoglobin: 11.1 g/dL — ABNORMAL LOW (ref 12.0–15.0)
MCH: 30.2 pg (ref 26.0–34.0)
MCHC: 32.6 g/dL (ref 30.0–36.0)
MCV: 92.7 fL (ref 80.0–100.0)
Platelets: 228 10*3/uL (ref 150–400)
RBC: 3.68 MIL/uL — ABNORMAL LOW (ref 3.87–5.11)
RDW: 14.2 % (ref 11.5–15.5)
WBC: 24.1 10*3/uL — ABNORMAL HIGH (ref 4.0–10.5)
nRBC: 0.1 % (ref 0.0–0.2)

## 2022-12-19 LAB — OSMOLALITY: Osmolality: 259 mOsm/kg — ABNORMAL LOW (ref 275–295)

## 2022-12-19 LAB — CREATININE, URINE, RANDOM: Creatinine, Urine: 55 mg/dL

## 2022-12-19 LAB — OSMOLALITY, URINE: Osmolality, Ur: 235 mOsm/kg — ABNORMAL LOW (ref 300–900)

## 2022-12-19 LAB — SODIUM, URINE, RANDOM: Sodium, Ur: 36 mmol/L

## 2022-12-19 LAB — TSH: TSH: 4.523 u[IU]/mL — ABNORMAL HIGH (ref 0.350–4.500)

## 2022-12-19 MED ORDER — ALLOPURINOL 300 MG PO TABS
300.0000 mg | ORAL_TABLET | Freq: Every day | ORAL | Status: DC
  Administered 2022-12-20 – 2022-12-21 (×2): 300 mg via ORAL
  Filled 2022-12-19 (×2): qty 1

## 2022-12-19 MED ORDER — GABAPENTIN 100 MG PO CAPS
100.0000 mg | ORAL_CAPSULE | Freq: Two times a day (BID) | ORAL | Status: DC
  Administered 2022-12-19 – 2022-12-21 (×4): 100 mg via ORAL
  Filled 2022-12-19 (×4): qty 1

## 2022-12-19 MED ORDER — ACETAMINOPHEN 325 MG PO TABS
650.0000 mg | ORAL_TABLET | Freq: Four times a day (QID) | ORAL | Status: DC | PRN
  Administered 2022-12-19 – 2022-12-20 (×5): 650 mg via ORAL
  Filled 2022-12-19 (×5): qty 2

## 2022-12-19 MED ORDER — FERROUS SULFATE 325 (65 FE) MG PO TABS
325.0000 mg | ORAL_TABLET | Freq: Every day | ORAL | Status: DC
  Administered 2022-12-20 – 2022-12-21 (×2): 325 mg via ORAL
  Filled 2022-12-19 (×2): qty 1

## 2022-12-19 MED ORDER — ONDANSETRON HCL 4 MG/2ML IJ SOLN: 4.0000 mg | Freq: Four times a day (QID) | INTRAMUSCULAR | Status: DC | PRN

## 2022-12-19 MED ORDER — LEVOTHYROXINE SODIUM 88 MCG PO TABS
88.0000 ug | ORAL_TABLET | Freq: Every day | ORAL | Status: DC
  Administered 2022-12-20 – 2022-12-21 (×2): 88 ug via ORAL
  Filled 2022-12-19 (×2): qty 1

## 2022-12-19 MED ORDER — MELATONIN 5 MG PO TABS
5.0000 mg | ORAL_TABLET | Freq: Every evening | ORAL | Status: AC | PRN
  Administered 2022-12-19: 5 mg via ORAL
  Filled 2022-12-19: qty 1

## 2022-12-19 MED ORDER — ENOXAPARIN SODIUM 40 MG/0.4ML IJ SOSY
40.0000 mg | PREFILLED_SYRINGE | INTRAMUSCULAR | Status: DC
  Administered 2022-12-19 – 2022-12-20 (×2): 40 mg via SUBCUTANEOUS
  Filled 2022-12-19 (×2): qty 0.4

## 2022-12-19 MED ORDER — ASPIRIN 81 MG PO TBEC
81.0000 mg | DELAYED_RELEASE_TABLET | Freq: Every day | ORAL | Status: DC
  Administered 2022-12-20 – 2022-12-21 (×2): 81 mg via ORAL
  Filled 2022-12-19 (×2): qty 1

## 2022-12-19 MED ORDER — BUPROPION HCL ER (XL) 300 MG PO TB24
300.0000 mg | ORAL_TABLET | Freq: Every day | ORAL | Status: DC
  Administered 2022-12-20 – 2022-12-21 (×2): 300 mg via ORAL
  Filled 2022-12-19 (×2): qty 1

## 2022-12-19 MED ORDER — PANTOPRAZOLE SODIUM 40 MG PO TBEC
40.0000 mg | DELAYED_RELEASE_TABLET | Freq: Every day | ORAL | Status: DC
  Administered 2022-12-20 – 2022-12-21 (×2): 40 mg via ORAL
  Filled 2022-12-19 (×2): qty 1

## 2022-12-19 MED ORDER — ROSUVASTATIN CALCIUM 5 MG PO TABS
5.0000 mg | ORAL_TABLET | Freq: Every day | ORAL | Status: DC
  Administered 2022-12-20 – 2022-12-21 (×2): 5 mg via ORAL
  Filled 2022-12-19 (×2): qty 1

## 2022-12-19 MED ORDER — ONDANSETRON HCL 4 MG PO TABS: 4.0000 mg | ORAL_TABLET | Freq: Four times a day (QID) | ORAL | Status: DC | PRN

## 2022-12-19 MED ORDER — SODIUM CHLORIDE 0.9 % IV SOLN: INTRAVENOUS | Status: DC

## 2022-12-19 MED ORDER — ACETAMINOPHEN 650 MG RE SUPP: 650.0000 mg | Freq: Four times a day (QID) | RECTAL | Status: DC | PRN

## 2022-12-19 NOTE — Plan of Care (Signed)
Plan of Care Note for Accepted Transfer   Patient: [name] [name]    ZOX:[id number]     Facility requesting transfer: [facility] Requesting Provider: Elise Benne, MD  77 year old female with history of CLL and remote ER positive breast cancer who presented to the ER due to some mild confusion and outpatient routine lab work which showed hyponatremia.  ER provider requesting observation admission for management of the same. Looks euvolemic on exam.  Most recent vitals, labs and radiology:  Blood pressure (!) 149/120, pulse 87, resp. rate 16, height 5\' 3"  (1.6 m), weight 77.6 kg, last menstrual period 08/26/1995, SpO2 100 %.      Latest Ref Rng & Units 12/16/2022   12:39 PM 11/17/2022    2:52 PM 10/13/2022    9:53 AM  CBC  WBC 4.0 - 10.5 K/uL 25.0  27.7  22.6   Hemoglobin 12.0 - 15.0 g/dL 81.1  91.4  78.2   Hematocrit 36.0 - 46.0 % 36.8  36.9  33.3   Platelets 150 - 400 K/uL 234  249  309       Latest Ref Rng & Units 12/19/2022   10:56 AM 12/16/2022   12:39 PM 11/17/2022    2:52 PM  BMP  Glucose 70 - 99 mg/dL 83  94  956   BUN 8 - 23 mg/dL 13  18  15    Creatinine 0.44 - 1.00 mg/dL 2.13  0.86  5.78   Sodium 135 - 145 mmol/L 124  126  133   Potassium 3.5 - 5.1 mmol/L 4.7  4.7  4.2   Chloride 98 - 111 mmol/L 92  95  101   CO2 22 - 32 mmol/L 21  24  26    Calcium 8.9 - 10.3 mg/dL 9.7  9.5  9.4     The patient has been accepted for transfer to [facility], depending on bed and resource availability. The patient will remain under the care and responsibility of the referring provider until they have arrived to our inpatient facility.  Author: Keyvin Rison Sharlette Dense, MD  12/19/2022  Check [email or web address] for on-call coverage.  Nursing staff, Please call [facility] Admits & Consults System-Wide number on Amion as soon as patient's arrival, so appropriate admitting provider can evaluate the pt.

## 2022-12-19 NOTE — Telephone Encounter (Signed)
   Name: [name] [name]  DOB: Oct 08, 1945  MRN: [id number]  Primary Cardiologist: Donato Schultz, MD  Chart reviewed as part of pre-operative protocol coverage. Because of [name] [name]'s past medical history and time since last visit, she will require a follow-up in-office visit in order to better assess preoperative cardiovascular risk.  Pre-op covering staff: - Please schedule appointment and call patient to inform them. If patient already had an upcoming appointment within acceptable timeframe, please add "pre-op clearance" to the appointment notes so provider is aware. - Please contact requesting surgeon's office via preferred method (i.e, phone, fax) to inform them of need for appointment prior to surgery.  Per office protocol, if patient is without any new symptoms or concerns at the time of their visit, she may hold Aspirin for 5-7 days prior to procedure. Please resume Aspirin as soon as possible postprocedure, at the discretion of the surgeon.    Joylene Grapes, NP  12/19/2022, 12:59 PM

## 2022-12-19 NOTE — ED Provider Notes (Signed)
[place] EMERGENCY DEPARTMENT AT [facility] Provider Note   CSN: [id number] Arrival date & time: 12/19/22  1031     History  Chief Complaint  Patient presents with   Abnormal Labs    [name] [name] is a 77 y.o. female.  HPI     77 year old female comes in with chief complaint of low sodium.  Patient has history of CLL, hypothyroidism and is currently on chemotherapy.  She states that her oncologist had checked routine labs recently and her sodium was low.  They discussed this with the primary care doctor, who advised that patient come to the emergency room as patient has had some symptoms concerning for hyponatremia.  Patient states that over the last few days she has noted episodes of confusion, forgetfulness, dizziness and her balance is slightly worse.  She is also sometimes having visual hallucinations.  Last time she was admitted to the hospital was in wintertime when she had hyponatremia, but the primary issue was respiratory problems.  She was told that amitriptyline could be contributing.  Home Medications Prior to Admission medications   Medication Sig Start Date End Date Taking? Authorizing Provider  acalabrutinib maleate (CALQUENCE) 100 MG tablet Take 1 tablet (100 mg total) by mouth 2 (two) times daily. 10/08/22   Jaci Standard, MD  allopurinol (ZYLOPRIM) 300 MG tablet TAKE 1 TABLET(300 MG) BY MOUTH DAILY 10/09/22   Jaci Standard, MD  Apoaequorin (PREVAGEN PO) Take 1 tablet by mouth daily.    [provider]  aspirin 81 MG EC tablet Take 1 tablet by mouth daily.    [provider]  brexpiprazole (REXULTI) 1 MG TABS tablet Take 1 mg by mouth daily.    [provider]  buPROPion (WELLBUTRIN XL) 300 MG 24 hr tablet Take 300 mg by mouth daily. 08/30/19   [provider]  CALCIUM CITRATE PO Take 1 tablet by mouth daily.    [provider]  Cholecalciferol (VITAMIN D3) 50 MCG (2000 UT) TABS Take 1 tablet by  mouth daily.    [provider]  COLLAGEN PO Take by mouth.    [provider]  Cyanocobalamin (VITAMIN B12) 1000 MCG TBCR Take 1 tablet by mouth daily.    [provider]  diclofenac Sodium (VOLTAREN) 1 % GEL Apply 2 g topically 4 (four) times daily. Patient taking differently: Apply 2 g topically daily as needed (for pain). 05/27/22   Raulkar, Drema Pry, MD  ferrous sulfate 325 (65 FE) MG tablet Take 325 mg by mouth daily with breakfast.    [provider]  fluvoxaMINE (LUVOX) 50 MG tablet Take 100 mg by mouth at bedtime. 09/27/22   [provider]  gabapentin (NEURONTIN) 100 MG capsule Take 100 mg by mouth 2 (two) times daily. 08/20/21   [provider]  levothyroxine (SYNTHROID, LEVOTHROID) 88 MCG tablet Take 88 mcg by mouth daily before breakfast. 06/30/17   [provider]  Multiple Vitamins-Minerals (WOMENS 50+ MULTI VITAMIN/MIN) TABS Take 1 tablet by mouth daily.    [provider]  omeprazole (PRILOSEC) 40 MG capsule TAKE 1 CAPSULE(40 MG) BY MOUTH DAILY AFTER BREAKFAST 11/14/22   Jaci Standard, MD  rosuvastatin (CRESTOR) 5 MG tablet Take 1 tablet (5 mg total) by mouth daily. 12/17/22   Jake Bathe, MD  UNABLE TO FIND Take 1 tablet by mouth 3 (three) times daily. Med Name: Metatrim    [provider]  zinc gluconate 50 MG tablet  Take 50 mg by mouth daily.    [provider]      Allergies    Lasix [furosemide], Lithium, Sulfa antibiotics, Trazodone and nefazodone, and Lyrica [pregabalin]    Review of Systems   Review of Systems  All other systems reviewed and are negative.   Physical Exam Updated Vital Signs BP (!) 149/120   Pulse 87   Resp 16   Ht 5\' 3"  (1.6 m)   Wt 77.6 kg   LMP 08/26/1995   SpO2 100%   BMI 30.29 kg/m  Physical Exam Vitals and nursing note reviewed.  Constitutional:      Appearance: She is well-developed.  HENT:     Head: Atraumatic.  Eyes:     Extraocular  Movements: Extraocular movements intact.     Pupils: Pupils are equal, round, and reactive to light.  Cardiovascular:     Rate and Rhythm: Normal rate.  Pulmonary:     Effort: Pulmonary effort is normal.  Musculoskeletal:     Cervical back: Normal range of motion and neck supple.  Skin:    General: Skin is warm and dry.  Neurological:     Mental Status: She is alert and oriented to person, place, and time.     ED Results / Procedures / Treatments   Labs (all labs ordered are listed, but only abnormal results are displayed) Labs Reviewed  BASIC METABOLIC PANEL - Abnormal; Notable for the following components:      Result Value   Sodium 124 (*)    Chloride 92 (*)    CO2 21 (*)    All other components within normal limits  OSMOLALITY  SODIUM, URINE, RANDOM  CREATININE, URINE, RANDOM  OSMOLALITY, URINE    EKG None  Radiology No results found.  Procedures .Critical Care  Performed by: Derwood Kaplan, MD Authorized by: Derwood Kaplan, MD   Critical care provider statement:    Critical care time (minutes):  37   Critical care was necessary to treat or prevent imminent or life-threatening deterioration of the following conditions:  Metabolic crisis   Critical care was time spent personally by me on the following activities:  Development of treatment plan with patient or surrogate, discussions with consultants, evaluation of patient's response to treatment, examination of patient, ordering and review of laboratory studies, ordering and review of radiographic studies, ordering and performing treatments and interventions, pulse oximetry, re-evaluation of patient's condition, review of old charts and obtaining history from patient or surrogate     Medications Ordered in ED Medications - No data to display  ED Course/ Medical Decision Making/ A&P Clinical Course as of 12/19/22 1251  Fri Dec 19, 2022  1250 Sodium(!): 124 Sodium is further lower at 124.  We will proceed  with admission request given that she is symptomatic with moderate to severe hyponatremia.  Results of the ER and the plan to admit discussed. [AN]  1250 Patient does not have profound hyponatremia.  For now I will restrict fluids, as patient appears euvolemic. [AN]    Clinical Course User Index [AN] Derwood Kaplan, MD                             Medical Decision Making Amount and/or Complexity of Data Reviewed Labs: ordered.  Risk Decision regarding hospitalization.   77 year old female with history of CLL comes in with chief complaint of abnormal sodium.  History provided by the patient, and also her spouse. I  also reviewed discharge summary from earlier this year when patient was treated for hyponatremia.  I reviewed the outpatient labs and patient's sodium indeed was 126.  Differential diagnosis for her hyponatremia includes dehydration, medication side effects, SIADH. She does not particularly appear dry at this time.  I ordered basic labs and we will reassess the patient. Patient had a normal TSH last time, therefore we will not repeat it.   Final Clinical Impression(s) / ED Diagnoses Final diagnoses:  Acute hyponatremia    Rx / DC Orders ED Discharge Orders     None         Derwood Kaplan, MD 12/19/22 1251

## 2022-12-19 NOTE — Progress Notes (Signed)
Pharmacy: medication review for hyponatremia The following meds were reviewed in [employer].  Acalabrutinib: none Allopurinol:  < 1% Amitriptyline: hyponatremia & SIADH rare, risk factors include older age, females, concomitant use of diuretics, low body weight, lower baseline serum sodium concentration, volume depletion, history of hyponatremia Amoxicillin: none Brexpiprazole: none Buproprion: post marketing reports of hyponatremia & SIADH Fluvoxamine: post marketing reports of hyponatremia & SIADH Gabapentin: post marketing reports hyponatremia Levothyroxine: none Rosuvastatin:  none Omeprazole: post marketing reports hyponatremia   Herby Abraham, Pharm.D Use secure chat for questions 12/19/2022 6:52 PM

## 2022-12-19 NOTE — Telephone Encounter (Signed)
1st attempt to reach pt regarding surgical clearance and the need for an in-office appointment.  Left a message for pt to call back and get that scheduled.  

## 2022-12-19 NOTE — H&P (Signed)
History and Physical  [name] [name]:[id number] DOB: 1946-07-08 DOA: 12/19/2022  PCP: Merri Brunette, MD   Chief Complaint: Low sodium  HPI: [name] [name] is a 77 y.o. female with medical history significant for CLL under the care of Dr. Leonides Schanz on oral chemotherapy as well as a history of GERD, hypertension, hypothyroidism on Synthroid, as well as previous history of treated right breast cancer being admitted to the hospital with symptomatic hyponatremia.  She gets weekly lab draws on a regular basis, outpatient labs were sodium 124 she discusses with her PCP and was told to come to the ER for evaluation.  Repeat in the ER this morning at [facility] was 126.  Per the family, she has been mildly confused, she tells me that she was hallucinating last night but now she feels back to normal.  ED Course: Evaluation in the ER showed stable vital signs, white blood cell count of 25,000 which is essentially her recent baseline due to CLL, and sodium 126.  She was referred for observation admission and treatment of her hyponatremia.  Currently she is comfortable and has no complaints.  Review of Systems: Please see HPI for pertinent positives and negatives. A complete 10 system review of systems are otherwise negative.  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   DDD (degenerative disc disease)    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 ear    Depression    Diverticulosis    Elevated LFTs    Fibromyalgia 1978   GERD (gastroesophageal reflux disease)    Hematuria    History of syncope    episode 2008--  no recurrence since   HLD (hyperlipidemia)    Hypothyroidism    Kidney stones 2014   Plantar fasciitis    Ruptured  disk    one in neck and two in back   S/P chemotherapy, time since greater than 12 weeks    Sinus tachycardia    mild resting   Skin cancer    nose   Stool incontinence    "at times recently"    Past Surgical History:  Procedure Laterality Date   ANTERIOR LATERAL LUMBAR FUSION 4 LEVELS Left 04/25/2016   Procedure: LEFT LUMBAR ONE-TWO, LUMBAR TWO-THREE, LUMBAR THREE-FOUR, LUMBAR FOUR-FIVE ANTERIOR LATERAL LUMBAR FUSION;  Surgeon: Julio Sicks, MD;  Location: [facility];  Service: Neurosurgery;  Laterality: Left;   APPENDECTOMY  1997   APPLICATION OF ROBOTIC ASSISTANCE FOR SPINAL PROCEDURE N/A 04/06/2017   Procedure: APPLICATION OF ROBOTIC ASSISTANCE FOR SPINAL PROCEDURE;  Surgeon: Julio Sicks, MD;  Location: [facility] OR;  Service: Neurosurgery;  Laterality: N/A;   BREAST BIOPSY Right 2014   BREAST LUMPECTOMY Right 1997   BREAST LUMPECTOMY WITH AXILLARY LYMPH NODE DISSECTION Right 1997   CATARACT EXTRACTION, BILATERAL  2019   Dr. Alben Spittle   CYSTOSCOPY WITH RETROGRADE PYELOGRAM, URETEROSCOPY AND STENT PLACEMENT Bilateral 06/17/2013   Procedure: CYSTOSCOPY WITH RETROGRADE PYELOGRAM, URETEROSCOPY AND LEFT DOUBLE  J STENT PLACEMENT RIGHT URETERAL HOLMIIUM LASER AND DOUBLE J STENT ;  Surgeon: Lindaann Slough, MD;  Location: [facility];  Service: Urology;  Laterality: Bilateral;   HOLMIUM LASER APPLICATION Bilateral 06/17/2013  Procedure: HOLMIUM LASER APPLICATION;  Surgeon: Lindaann Slough, MD;  Location: [facility];  Service: Urology;  Laterality: Bilateral;   LITHOTRIPSY Left 06/2013   LUMBAR EPIDURAL INJECTION     has had 7 injections   PORT-A-CATH REMOVAL Left 10/03/2014   Procedure: REMOVAL PORT-A-CATH;  Surgeon: Manus Rudd, MD;  Location: [facility];  Service: General;  Laterality: Left;   PORTACATH PLACEMENT Left 07/28/2013   Procedure: ATTEMPTED INSERTION PORT-A-CATH;  Surgeon: Wilmon Arms. Corliss Skains, MD;  Location: [facility] OR;  Service: General;   Laterality: Left;   POSTERIOR LUMBAR FUSION 4 LEVEL N/A 04/25/2016   Procedure: LUMBAR FIVE-SACRAL ONE POSTERIOR LUMBAR INTERBODY FUSION, THORACIC NINE-SACRAL ONE POSTERIOR LATERAL ARTHRODESIS WITH PEDICLE SCREWS;  Surgeon: Julio Sicks, MD;  Location: [facility];  Service: Neurosurgery;  Laterality: N/A;   RETINAL DETACHMENT SURGERY Right 2013   ROBOTIC ASSITED PARTIAL NEPHRECTOMY Right 02/05/2015   Procedure: ROBOTIC ASSITED PARTIAL NEPHRECTOMY;  Surgeon: Heloise Purpura, MD;  Location: [facility];  Service: Urology;  Laterality: Right;   SIMPLE MASTECTOMY WITH AXILLARY SENTINEL NODE BIOPSY Right 07/28/2013   Procedure: RIGHT TOTAL  MASTECTOMY;  Surgeon: Wilmon Arms. Corliss Skains, MD;  Location: [facility] OR;  Service: General;  Laterality: Right;   TONSILLECTOMY  AGE 47   TOTAL ABDOMINAL HYSTERECTOMY W/ BILATERAL SALPINGOOPHORECTOMY  1997    Social History:  reports that she has never smoked. She has never used smokeless tobacco. She reports that she does not drink alcohol and does not use drugs.   Allergies  Allergen Reactions   Furosemide Nausea And Vomiting, Other (See Comments) and Itching    HEADACHE  Other Reaction(s): GI Intolerance, Other (See Comments)   Lithium Other (See Comments)    Dizziness, "cause me to fall"   Sulfa Antibiotics Hives   Trazodone And Nefazodone Other (See Comments)    insomnia   Lyrica [Pregabalin] Diarrhea, Nausea Only and Rash    Family History  Problem Relation Age of Onset   Heart disease Maternal Grandfather    Diabetes Maternal Grandfather    Colon cancer Maternal Aunt 42   Brain cancer Paternal Grandmother        dx in 20s   Dementia Mother    Diabetes Mother    Osteoporosis Mother    Diabetes Maternal Aunt    Prostate cancer Father 67   Bipolar disorder Maternal Aunt    Stroke Maternal Aunt    Stomach cancer Paternal Uncle        dx in late 21s     Prior to Admission medications   Medication Sig Start Date End Date Taking? Authorizing Provider   acalabrutinib maleate (CALQUENCE) 100 MG tablet Take 1 tablet (100 mg total) by mouth 2 (two) times daily. 10/08/22  Yes Jaci Standard, MD  allopurinol (ZYLOPRIM) 300 MG tablet TAKE 1 TABLET(300 MG) BY MOUTH DAILY 10/09/22  Yes Ulysees Barns IV, MD  Apoaequorin (PREVAGEN PO) Take 1 tablet by mouth daily.   Yes [provider]  aspirin 81 MG EC tablet Take 1 tablet by mouth daily.   Yes [provider]  brexpiprazole (REXULTI) 1 MG TABS tablet Take 1 mg by mouth daily.   Yes [provider]  buPROPion (WELLBUTRIN XL) 300 MG 24 hr tablet Take 300 mg by mouth daily. 08/30/19  Yes [provider]  CALCIUM CITRATE PO Take 1 tablet by mouth daily.   Yes [provider]  Cholecalciferol (VITAMIN D3) 50 MCG (2000 UT) TABS Take 1  tablet by mouth daily.   Yes [provider]  COLLAGEN PO Take by mouth.   Yes [provider]  Cyanocobalamin (VITAMIN B12) 1000 MCG TBCR Take 1 tablet by mouth daily.   Yes [provider]  diclofenac Sodium (VOLTAREN) 1 % GEL Apply 2 g topically 4 (four) times daily. Patient taking differently: Apply 2 g topically daily as needed (for pain). 05/27/22  Yes Raulkar, Drema Pry, MD  ferrous sulfate 325 (65 FE) MG tablet Take 325 mg by mouth daily with breakfast.   Yes [provider]  fluvoxaMINE (LUVOX) 50 MG tablet Take 100 mg by mouth at bedtime. 09/27/22  Yes [provider]  gabapentin (NEURONTIN) 100 MG capsule Take 100 mg by mouth 2 (two) times daily. 08/20/21  Yes [provider]  levothyroxine (SYNTHROID, LEVOTHROID) 88 MCG tablet Take 88 mcg by mouth daily before breakfast. 06/30/17  Yes [provider]  Multiple Vitamins-Minerals (WOMENS 50+ MULTI VITAMIN/MIN) TABS Take 1 tablet by mouth daily.   Yes [provider]  omeprazole (PRILOSEC) 40 MG capsule TAKE 1 CAPSULE(40 MG) BY MOUTH DAILY AFTER BREAKFAST 11/14/22  Yes Jaci Standard, MD  rosuvastatin  (CRESTOR) 5 MG tablet Take 1 tablet (5 mg total) by mouth daily. 12/17/22  Yes Jake Bathe, MD  zinc gluconate 50 MG tablet Take 50 mg by mouth daily.   Yes [provider]  UNABLE TO FIND Take 1 tablet by mouth 3 (three) times daily. Med Name: Metatrim    [provider]    Physical Exam: BP 120/63 (BP Location: Left Arm)   Pulse 75   Temp 98.6 F (37 C) (Oral)   Resp 16   Ht 5\' 3"  (1.6 m)   Wt 79.6 kg   LMP 08/26/1995   SpO2 100%   BMI 31.09 kg/m   General:  Alert, oriented, calm, in no acute distress, pleasant and cooperative, good historian and looks overall euvolemic on exam. Eyes: EOMI, clear conjuctivae, white sclerea Neck: supple, no masses, trachea mildline  Cardiovascular: RRR, no murmurs or rubs, no peripheral edema  Respiratory: clear to auscultation bilaterally, no wheezes, no crackles  Abdomen: soft, nontender, nondistended, normal bowel tones heard  Skin: dry, no rashes  Musculoskeletal: no joint effusions, normal range of motion  Psychiatric: appropriate affect, normal speech  Neurologic: extraocular muscles intact, clear speech, moving all extremities with intact sensorium          Labs on Admission:  Basic Metabolic Panel: Recent Labs  Lab 12/16/22 1239 12/19/22 1056  NA 126* 124*  K 4.7 4.7  CL 95* 92*  CO2 24 21*  GLUCOSE 94 83  BUN 18 13  CREATININE 1.03* 0.88  CALCIUM 9.5 9.7   Liver Function Tests: Recent Labs  Lab 12/16/22 1239  AST 20  ALT 18  ALKPHOS 86  BILITOT 0.3  PROT 6.8  ALBUMIN 4.6   No results for input(s): "LIPASE", "AMYLASE" in the last 168 hours. No results for input(s): "AMMONIA" in the last 168 hours. CBC: Recent Labs  Lab 12/16/22 1239  WBC 25.0*  NEUTROABS 4.8  HGB 12.1  HCT 36.8  MCV 92.0  PLT 234   Cardiac Enzymes: No results for input(s): "CKTOTAL", "CKMB", "CKMBINDEX", "TROPONINI" in the last 168 hours.  BNP (last 3 results) No results for input(s): "BNP" in the last 8760  hours.  ProBNP (last 3 results) No results for input(s): "PROBNP" in the last 8760 hours.  CBG: No results for input(s): "GLUCAP"  in the last 168 hours.  Radiological Exams on Admission: No results found.  Assessment/Plan   Hyponatremia-this is not severe, but symptomatic. -Observation admission -Follow sodium every 6 hours, aim for sodium correction of 8 to 10 mEq in the next 24 hours -Normal saline infusion 75 cc/h -Fluid restriction 1800 cc per 24 hours -Pharmacy consulted to review medications that may be causing hyponatremia -Check TSH Metabolic encephalopathy from hyponatremia  At home medications have been resumed as appropriate:    HLD (hyperlipidemia)   Hypothyroidism   Malignant neoplasm of lower-outer quadrant of right breast of female, estrogen receptor positive (HCC) now status posttreatment on surveillance   GERD (gastroesophageal reflux disease)   Essential hypertension   Pure hypercholesterolemia  DVT prophylaxis: Lovenox     Code Status: Full Code  Consults called: None  Admission status: Observation  Time spent: 46 minutes  Arlyn Bumpus Sharlette Dense MD [facility] Pager [phone]  If 7PM-7AM, please contact night-coverage [email or web address] Password [facility]  12/19/2022, 5:32 PM

## 2022-12-19 NOTE — ED Triage Notes (Signed)
Pt arrives pov, steady gait, reports sent to ED by PCP for low NA+. Pt reports "feeling hazy"

## 2022-12-19 NOTE — Progress Notes (Signed)
Patient arrived from the ED. Alert, awake and orient x4. Denies pain. Admission completed. MD notified of patient's arrival. Safety precautions in place.    12/19/22 1541  Vitals  Temp 98.6 F (37 C)  Temp Source Oral  BP 120/63  MAP (mmHg) 77  BP Location Left Arm  BP Method Automatic  Patient Position (if appropriate) Lying  Pulse Rate 75  Pulse Rate Source Monitor  Resp 16  MEWS COLOR  MEWS Score Color Green  Oxygen Therapy  SpO2 100 %  O2 Device Room Air  Pain Assessment  Pain Scale 0-10  Pain Score 0  Height and Weight  Height 5\' 3"  (1.6 m)  Weight 79.6 kg  Type of Scale Used Bed  BSA (Calculated - sq m) 1.88 sq meters  BMI (Calculated) 31.09  Weight in (lb) to have BMI = 25 140.8  MEWS Score  MEWS Temp 0  MEWS Systolic 0  MEWS Pulse 0  MEWS RR 0  MEWS LOC 0  MEWS Score 0

## 2022-12-19 NOTE — ED Notes (Signed)
Called [facility] to give report, was advised nurse is busy and to call back

## 2022-12-19 NOTE — ED Notes (Signed)
[name] [name] at [facility] will send transport. Bed Ready at [facility] Room#1508.-ABB(NS)

## 2022-12-20 DIAGNOSIS — Z8262 Family history of osteoporosis: Secondary | ICD-10-CM | POA: Diagnosis not present

## 2022-12-20 DIAGNOSIS — Z808 Family history of malignant neoplasm of other organs or systems: Secondary | ICD-10-CM | POA: Diagnosis not present

## 2022-12-20 DIAGNOSIS — Z7982 Long term (current) use of aspirin: Secondary | ICD-10-CM | POA: Diagnosis not present

## 2022-12-20 DIAGNOSIS — Z87442 Personal history of urinary calculi: Secondary | ICD-10-CM | POA: Diagnosis not present

## 2022-12-20 DIAGNOSIS — Z90710 Acquired absence of both cervix and uterus: Secondary | ICD-10-CM | POA: Diagnosis not present

## 2022-12-20 DIAGNOSIS — M479 Spondylosis, unspecified: Secondary | ICD-10-CM | POA: Diagnosis present

## 2022-12-20 DIAGNOSIS — G629 Polyneuropathy, unspecified: Secondary | ICD-10-CM | POA: Diagnosis present

## 2022-12-20 DIAGNOSIS — Z6831 Body mass index (BMI) 31.0-31.9, adult: Secondary | ICD-10-CM | POA: Diagnosis not present

## 2022-12-20 DIAGNOSIS — E871 Hypo-osmolality and hyponatremia: Secondary | ICD-10-CM | POA: Diagnosis not present

## 2022-12-20 DIAGNOSIS — Z8042 Family history of malignant neoplasm of prostate: Secondary | ICD-10-CM | POA: Diagnosis not present

## 2022-12-20 DIAGNOSIS — Z800 Family history of malignant neoplasm of digestive organs: Secondary | ICD-10-CM | POA: Diagnosis not present

## 2022-12-20 DIAGNOSIS — F32A Depression, unspecified: Secondary | ICD-10-CM | POA: Diagnosis present

## 2022-12-20 DIAGNOSIS — K219 Gastro-esophageal reflux disease without esophagitis: Secondary | ICD-10-CM | POA: Diagnosis present

## 2022-12-20 DIAGNOSIS — H9191 Unspecified hearing loss, right ear: Secondary | ICD-10-CM | POA: Diagnosis present

## 2022-12-20 DIAGNOSIS — E039 Hypothyroidism, unspecified: Secondary | ICD-10-CM | POA: Diagnosis present

## 2022-12-20 DIAGNOSIS — E860 Dehydration: Secondary | ICD-10-CM | POA: Diagnosis present

## 2022-12-20 DIAGNOSIS — Z833 Family history of diabetes mellitus: Secondary | ICD-10-CM | POA: Diagnosis not present

## 2022-12-20 DIAGNOSIS — E7800 Pure hypercholesterolemia, unspecified: Secondary | ICD-10-CM | POA: Diagnosis present

## 2022-12-20 DIAGNOSIS — M797 Fibromyalgia: Secondary | ICD-10-CM | POA: Diagnosis present

## 2022-12-20 DIAGNOSIS — G9341 Metabolic encephalopathy: Secondary | ICD-10-CM | POA: Diagnosis present

## 2022-12-20 DIAGNOSIS — F419 Anxiety disorder, unspecified: Secondary | ICD-10-CM | POA: Diagnosis present

## 2022-12-20 DIAGNOSIS — C9110 Chronic lymphocytic leukemia of B-cell type not having achieved remission: Secondary | ICD-10-CM | POA: Diagnosis present

## 2022-12-20 DIAGNOSIS — E669 Obesity, unspecified: Secondary | ICD-10-CM | POA: Diagnosis present

## 2022-12-20 DIAGNOSIS — I10 Essential (primary) hypertension: Secondary | ICD-10-CM | POA: Diagnosis present

## 2022-12-20 LAB — BASIC METABOLIC PANEL
Anion gap: 11 (ref 5–15)
Anion gap: 8 (ref 5–15)
BUN: 13 mg/dL (ref 8–23)
BUN: 14 mg/dL (ref 8–23)
CO2: 17 mmol/L — ABNORMAL LOW (ref 22–32)
CO2: 18 mmol/L — ABNORMAL LOW (ref 22–32)
Calcium: 8.3 mg/dL — ABNORMAL LOW (ref 8.9–10.3)
Calcium: 8.8 mg/dL — ABNORMAL LOW (ref 8.9–10.3)
Chloride: 102 mmol/L (ref 98–111)
Chloride: 97 mmol/L — ABNORMAL LOW (ref 98–111)
Creatinine, Ser: 0.66 mg/dL (ref 0.44–1.00)
Creatinine, Ser: 0.79 mg/dL (ref 0.44–1.00)
GFR, Estimated: >60 mL/min (ref >60)
GFR, Estimated: >60 mL/min (ref >60)
Glucose, Bld: 127 mg/dL — ABNORMAL HIGH (ref 70–99)
Glucose, Bld: 89 mg/dL (ref 70–99)
Potassium: 3.9 mmol/L (ref 3.5–5.1)
Potassium: 3.9 mmol/L (ref 3.5–5.1)
Sodium: 125 mmol/L — ABNORMAL LOW (ref 135–145)
Sodium: 128 mmol/L — ABNORMAL LOW (ref 135–145)

## 2022-12-20 LAB — CBC
HCT: 32.2 % — ABNORMAL LOW (ref 36.0–46.0)
Hemoglobin: 10.7 g/dL — ABNORMAL LOW (ref 12.0–15.0)
MCH: 30.4 pg (ref 26.0–34.0)
MCHC: 33.2 g/dL (ref 30.0–36.0)
MCV: 91.5 fL (ref 80.0–100.0)
Platelets: 199 10*3/uL (ref 150–400)
RBC: 3.52 MIL/uL — ABNORMAL LOW (ref 3.87–5.11)
RDW: 14.1 % (ref 11.5–15.5)
WBC: 19.4 10*3/uL — ABNORMAL HIGH (ref 4.0–10.5)
nRBC: 0 % (ref 0.0–0.2)

## 2022-12-20 LAB — T4, FREE: Free T4: 0.99 ng/dL (ref 0.61–1.12)

## 2022-12-20 LAB — SODIUM: Sodium: 132 mmol/L — ABNORMAL LOW (ref 135–145)

## 2022-12-20 MED ORDER — ACALABRUTINIB MALEATE 100 MG PO TABS: 100.0000 mg | ORAL_TABLET | Freq: Two times a day (BID) | ORAL | Status: DC

## 2022-12-20 MED ORDER — FLUVOXAMINE MALEATE 50 MG PO TABS
100.0000 mg | ORAL_TABLET | Freq: Every day | ORAL | Status: DC
  Administered 2022-12-20: 100 mg via ORAL
  Filled 2022-12-20: qty 2

## 2022-12-20 MED ORDER — BREXPIPRAZOLE 1 MG PO TABS
1.0000 mg | ORAL_TABLET | Freq: Every day | ORAL | Status: DC
  Administered 2022-12-20 – 2022-12-21 (×2): 1 mg via ORAL
  Filled 2022-12-20 (×2): qty 1

## 2022-12-20 NOTE — Progress Notes (Addendum)
Prior-To-Admission Oral Chemotherapy for Treatment of Oncologic Disease   Order noted from Dr. [place] to continue prior-to-admission oral chemotherapy regimen of acalabrutinib .  Procedure Per Pharmacy & Therapeutics Committee Policy: Orders for continuation of home oral chemotherapy for treatment of an oncologic disease will be held unless approved by an oncologist during current admission.    For patients receiving oncology care at [facility], inpatient pharmacist contacts patient's oncologist during regular office hours to review. If earlier review is medically necessary, attending physician consults [facility] on-call oncologist   Oral chemotherapy continuation order is on hold pending oncologist review, [facility] oncologist Dr. Leonides Schanz  will be notified by inpatient pharmacy during office hours on Monday 12/22/22.   Lucia Gaskins 12/20/2022, 11:11 AM  _________________________________  Adden: Per Dr. [place] via [place] msg, d/c acalabrutinib for now.  Dorna Leitz, PharmD, BCPS 12/20/2022 11:23 AM

## 2022-12-20 NOTE — Evaluation (Addendum)
Occupational Therapy Evaluation Patient Details Name: [name] [name] MRN: [id number] DOB: April 28, 1946 Today's Date: 12/20/2022   History of Present Illness Patient is a 77 year old female who presented with low sodium levels per weekly lab draws with PCP. Patient also endorsed hallucinations as well. Patient was admitted with hyponatremia and metabolic encephalopathy. 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 All education has been completed and the patient has no further questions. Patient is MI for ADLs at this time. Patient endorsed being at her baseline. Patient was educated on the importance of checking feet each night with neuropathy with patient verbalized understanding.  See below for any follow-up Occupational Therapy or equipment needs. OT is signing off. Thank you for this referral.       Recommendations for follow up therapy are one component of a multi-disciplinary discharge planning process, led by the attending physician.  Recommendations may be updated based on patient status, additional functional criteria and insurance authorization.   Assistance Recommended at Discharge None  Patient can return home with the following Assistance with cooking/housework    Functional Status Assessment  Patient has not had a recent decline in their functional status  Equipment Recommendations  None recommended by OT    Recommendations for Other Services       Precautions / Restrictions Restrictions Weight Bearing Restrictions: No      Mobility Bed Mobility Overal bed mobility: Modified Independent                         Balance Overall balance assessment: No apparent balance deficits (not formally assessed)                                         ADL either performed or assessed with  clinical judgement   ADL Overall ADL's : Modified independent                                       General ADL Comments: patient is able to complete simulated toileting, LB dressing, standing at sink for grooming tasks with no AD and no LOB. patient endorsed being at her baseline for ADLs at this time.      Pertinent Vitals/Pain Pain Assessment Pain Assessment: 0-10 Pain Score: 5  Pain Location: back Pain Descriptors / Indicators: Discomfort, Grimacing Pain Intervention(s): Limited activity within patient's tolerance, Monitored during session, Repositioned     Hand Dominance Right   Extremity/Trunk Assessment Upper Extremity Assessment Upper Extremity Assessment: Overall WFL for tasks assessed   Lower Extremity Assessment Lower Extremity Assessment: Defer to PT evaluation   Cervical / Trunk Assessment Cervical / Trunk Assessment: Normal   Communication Communication Communication: No difficulties   Cognition Arousal/Alertness: Awake/alert Behavior During Therapy: WFL for tasks assessed/performed Overall Cognitive Status: Within Functional Limits for tasks assessed                                                  Home Living Family/patient expects to be discharged to:: Private  residence Living Arrangements: Spouse/significant other Available Help at Discharge: Family;Available 24 hours/day Type of Home: House Home Access: Stairs to enter [employer] of Steps: 2 Entrance Stairs-Rails: Right;Left Home Layout: Two level;Able to live on main level with bedroom/bathroom Alternate Level Stairs-Number of Steps: she has a basement and main level, however she does not have to access the basement   Bathroom Shower/Tub: Producer, television/film/video: Standard     Home Equipment: Agricultural consultant (2 wheels);Cane - single point;Shower seat          Prior Functioning/Environment Prior Level of Function :  Independent/Modified Independent               ADLs Comments: She was independent with ADLs, driving, and light cleaning within what her back can tolerate. pending another back surgery per patient report.        OT Problem List:        OT Treatment/Interventions:      OT Goals(Current goals can be found in the care plan section) Acute Rehab OT Goals Patient Stated Goal: to get better OT Goal Formulation: All assessment and education complete, DC therapy  OT Frequency:         AM-PAC OT "6 Clicks" Daily Activity     Outcome Measure Help from another person eating meals?: None Help from another person taking care of personal grooming?: None Help from another person toileting, which includes using toliet, bedpan, or urinal?: None Help from another person bathing (including washing, rinsing, drying)?: None Help from another person to put on and taking off regular upper body clothing?: None Help from another person to put on and taking off regular lower body clothing?: None 6 Click Score: 24   End of Session Nurse Communication: Mobility status  Activity Tolerance: Patient tolerated treatment well Patient left: in chair;with call bell/phone within reach  OT Visit Diagnosis: Unsteadiness on feet (R26.81)                Time: 1610-9604 OT Time Calculation (min): 21 min Charges:  OT General Charges $OT Visit: 1 Visit OT Evaluation $OT Eval Low Complexity: 1 Low  Zainah Steven OTR/L, MS Acute Rehabilitation Department Office# [phone]   Selinda Flavin 12/20/2022, 10:33 AM

## 2022-12-20 NOTE — Progress Notes (Signed)
PROGRESS NOTE    [name] [name]  ZOX:[id number] DOB: 1945/12/20 DOA: 12/19/2022 PCP: Merri Brunette, MD    Brief Narrative:   [name] [name] is a 77 y.o. 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  Patient was seen by PCP with concerns for low sodium.  Her sodium was noted to be 124 and was directed to the ED for further evaluation and IV hydration.  Patient with history of recurrent symptomatic hyponatremia.  Family reports that she was mildly confused with hallucinations overnight that now have resolved.  In the ED, temperature 98.6 F, HR 87, RR 16, BP 149/120, SpO2 100% on room air.  Sodium 124, potassium 4.7, chloride 92, CO2 21, glucose 83, BUN 13, creatinine 0.88.  WBC 24.1, hemoglobin 11.1, platelets 228.  Urine sodium 36, urine osmolality 235, urine creatinine 55.  TRH consulted for admission for symptomatic hyponatremia patient was transferred to [facility] for further evaluation and management.  Assessment & Plan:   Symptomatic hyponatremia Patient presenting to ED by direction of her PCP for low sodium associated with some confusion, "haziness".  Sodium 124 on admission.  Urine sodium 36, urine osmolality 235, serum osmolality 259.  Suspect etiology likely component of mild dehydration as well as SIADH. -- Na 124>123>128 -- Fluid restriction 1800 mL/day -- NS at 75 mL/h -- Sodium level every 12 hours -- BMP daily  Leukocytosis WBC count 24.1 on admission.  History of CLL.  Likely component of underlying malignancy as well as hemoconcentration in the setting of dehydration.  No signs/symptoms of active infectious process. -- WBC 24.1>19.4 -- CBC daily  CLL With medical oncology outpatient, Dr. Leonides Schanz.  Currently on Calquence 100mg  PO BID.  Essential hypertension Currently not on antihypertensives  outpatient.  BP 128/59 this morning, well-controlled.  Hyperlipidemia -- Crestor 5 mg p.o. daily  Hypothyroidism TSH slightly elevated 4.523 on admission. -- Check free T4 -- Continue home levothyroxine 88 mcg p.o. daily  Depression/anxiety -- Rexulti 1 mg p.o. daily -- Wellbutrin 300 mg p.o. daily -- Luvox 100 mg p.o. nightly  Peripheral neuropathy -- Gabapentin 100 mg p.o. twice daily  GERD --Continue PPI  Weakness/debility/deconditioning/gait disturbance: Patient currently lives at home with her husband.  Reports feeling off balance as of late, does have diagnosis of peripheral neuropathy which is likely contributing factor. -- PT/OT evaluation  Obesity Body mass index is 31.09 kg/m.  Discussed with patient needs for aggressive lifestyle changes/weight loss as this complicates all facets of care.  Outpatient follow-up with PCP.    DVT prophylaxis: enoxaparin (LOVENOX) injection 40 mg Start: 12/19/22 1830 SCDs Start: 12/19/22 1732    Code Status: Full Code Family Communication: No family present at bedside this morning  Disposition Plan:  Level of care: Med-Surg Status is: Observation The patient remains OBS appropriate and will d/c before 2 midnights.    Consultants:  None  Procedures:  None  Antimicrobials:  None   Subjective: Patient seen examined bedside, resting comfortably.  Lying in bed.  Confusion and dizziness now resolved.  Patient reports she feels somewhat off balance prior to admission.  Discussed will obtain PT evaluation today.  Remains on IV fluids.  Sodium slowly improving.  No other specific complaints or concerns at this time.  Denies headache, no dizziness, no visual disturbance, no chest pain, no palpitations, no fever/chills/night sweats, no nausea/vomiting/diarrhea, no focal weakness,  no fatigue, no cough/congestion, no paresthesias.  No acute events overnight per nursing staff.  Objective: Vitals:   12/19/22 1541 12/19/22 2005  12/19/22 2340 12/20/22 0443  BP: 120/63 (!) 133/52 108/75 (!) 128/59  Pulse: 75 84 66 73  Resp: 16 16 18 18   Temp: 98.6 F (37 C) 98.4 F (36.9 C) 98.2 F (36.8 C) 98 F (36.7 C)  TempSrc: Oral Oral Oral Oral  SpO2: 100% 97% 98% 100%  Weight: 79.6 kg     Height: 5\' 3"  (1.6 m)       Intake/Output Summary (Last 24 hours) at 12/20/2022 1056 Last data filed at 12/20/2022 0827 Gross per 24 hour  Intake 1685.07 ml  Output 1901 ml  Net -215.93 ml   Filed Weights   12/19/22 1038 12/19/22 1541  Weight: 77.6 kg 79.6 kg    Examination:  Physical Exam: GEN: NAD, alert and oriented x 3, obese HEENT: NCAT, PERRL, EOMI, sclera clear, MMM PULM: CTAB w/o wheezes/crackles, normal respiratory effort, on room air CV: RRR w/o M/G/R GI: abd soft, NTND, NABS, no R/G/M MSK: no peripheral edema, muscle strength globally intact 5/5 bilateral upper/lower extremities NEURO: CN II-XII intact, no focal deficits, sensation to light touch intact PSYCH: normal mood/affect Integumentary: dry/intact, no rashes or wounds    Data Reviewed: I have personally reviewed following labs and imaging studies  CBC: Recent Labs  Lab 12/16/22 1239 12/19/22 1756 12/20/22 0651  WBC 25.0* 24.1* 19.4*  NEUTROABS 4.8  --   --   HGB 12.1 11.1* 10.7*  HCT 36.8 34.1* 32.2*  MCV 92.0 92.7 91.5  PLT 234 228 199   Basic Metabolic Panel: Recent Labs  Lab 12/16/22 1239 12/19/22 1056 12/19/22 1756 12/19/22 2335 12/20/22 0651  NA 126* 124* 123* 125* 128*  K 4.7 4.7 4.7 3.9 3.9  CL 95* 92* 94* 97* 102  CO2 24 21* 18* 17* 18*  GLUCOSE 94 83 82 127* 89  BUN 18 13 12 14 13   CREATININE 1.03* 0.88 0.80 0.79 0.66  CALCIUM 9.5 9.7 9.0 8.8* 8.3*   GFR: Estimated Creatinine Clearance: 58.8 mL/min (by C-G formula based on SCr of 0.66 mg/dL). Liver Function Tests: Recent Labs  Lab 12/16/22 1239  AST 20  ALT 18  ALKPHOS 86  BILITOT 0.3  PROT 6.8  ALBUMIN 4.6   No results for input(s): "LIPASE", "AMYLASE" in  the last 168 hours. No results for input(s): "AMMONIA" in the last 168 hours. Coagulation Profile: No results for input(s): "INR", "PROTIME" in the last 168 hours. Cardiac Enzymes: No results for input(s): "CKTOTAL", "CKMB", "CKMBINDEX", "TROPONINI" in the last 168 hours. BNP (last 3 results) No results for input(s): "PROBNP" in the last 8760 hours. HbA1C: No results for input(s): "HGBA1C" in the last 72 hours. CBG: No results for input(s): "GLUCAP" in the last 168 hours. Lipid Profile: No results for input(s): "CHOL", "HDL", "LDLCALC", "TRIG", "CHOLHDL", "LDLDIRECT" in the last 72 hours. Thyroid Function Tests: Recent Labs    12/19/22 1756  TSH 4.523*   Anemia Panel: No results for input(s): "VITAMINB12", "FOLATE", "FERRITIN", "TIBC", "IRON", "RETICCTPCT" in the last 72 hours. Sepsis Labs: No results for input(s): "PROCALCITON", "LATICACIDVEN" in the last 168 hours.  No results found for this or any previous visit (from the past 240 hour(s)).       Radiology Studies: No results found.      Scheduled Meds:  allopurinol  300 mg Oral Daily   aspirin EC  81 mg Oral Daily   buPROPion  300 mg Oral Daily   enoxaparin (LOVENOX) injection  40 mg Subcutaneous Q24H   ferrous sulfate  325 mg Oral Q breakfast   gabapentin  100 mg Oral BID   levothyroxine  88 mcg Oral QAC breakfast   pantoprazole  40 mg Oral Daily   rosuvastatin  5 mg Oral Daily   Continuous Infusions:  sodium chloride 75 mL/hr at 12/20/22 0551     LOS: 0 days    Time spent: 52 minutes spent on chart review, discussion with nursing staff, consultants, updating family and interview/physical exam; more than 50% of that time was spent in counseling and/or coordination of care.    Alvira Philips [place], DO [facility] Available via Epic secure chat 7am-7pm After these hours, please refer to coverage provider listed on [email or web address] 12/20/2022, 10:56 AM

## 2022-12-20 NOTE — Plan of Care (Signed)
  Problem: Education: Goal: Knowledge of General Education information will improve Description: Including pain rating scale, medication(s)/side effects and non-pharmacologic comfort measures Outcome: Progressing   Problem: Health Behavior/Discharge Planning: Goal: Ability to manage health-related needs will improve Outcome: Progressing   Problem: Clinical Measurements: Goal: Ability to maintain clinical measurements within normal limits will improve Outcome: Progressing Goal: Will remain free from infection Outcome: Progressing   Problem: Clinical Measurements: Goal: Diagnostic test results will improve Outcome: Not Progressing   

## 2022-12-21 DIAGNOSIS — E871 Hypo-osmolality and hyponatremia: Secondary | ICD-10-CM | POA: Diagnosis not present

## 2022-12-21 LAB — CBC
HCT: 32.4 % — ABNORMAL LOW (ref 36.0–46.0)
Hemoglobin: 10.6 g/dL — ABNORMAL LOW (ref 12.0–15.0)
MCH: 30.3 pg (ref 26.0–34.0)
MCHC: 32.7 g/dL (ref 30.0–36.0)
MCV: 92.6 fL (ref 80.0–100.0)
Platelets: 195 10*3/uL (ref 150–400)
RBC: 3.5 MIL/uL — ABNORMAL LOW (ref 3.87–5.11)
RDW: 14.2 % (ref 11.5–15.5)
WBC: 16.8 10*3/uL — ABNORMAL HIGH (ref 4.0–10.5)
nRBC: 0 % (ref 0.0–0.2)

## 2022-12-21 LAB — BASIC METABOLIC PANEL
Anion gap: 11 (ref 5–15)
BUN: 13 mg/dL (ref 8–23)
CO2: 18 mmol/L — ABNORMAL LOW (ref 22–32)
Calcium: 8.6 mg/dL — ABNORMAL LOW (ref 8.9–10.3)
Chloride: 101 mmol/L (ref 98–111)
Creatinine, Ser: 0.71 mg/dL (ref 0.44–1.00)
GFR, Estimated: >60 mL/min (ref >60)
Glucose, Bld: 102 mg/dL — ABNORMAL HIGH (ref 70–99)
Potassium: 4 mmol/L (ref 3.5–5.1)
Sodium: 130 mmol/L — ABNORMAL LOW (ref 135–145)

## 2022-12-21 MED ORDER — SODIUM CHLORIDE 1 G PO TABS: 1.0000 g | ORAL_TABLET | Freq: Two times a day (BID) | ORAL | 2 refills | Status: AC

## 2022-12-21 MED ORDER — SODIUM CHLORIDE 1 G PO TABS
1.0000 g | ORAL_TABLET | Freq: Two times a day (BID) | ORAL | Status: DC
  Administered 2022-12-21: 1 g via ORAL
  Filled 2022-12-21: qty 1

## 2022-12-21 NOTE — Discharge Summary (Signed)
Physician Discharge Summary  MARGERIE FRAISER OZH:[id number] DOB: 06-04-1946 DOA: 12/19/2022  PCP: Merri Brunette, MD  Admit date: 12/19/2022 Discharge date: 12/21/2022  Admitted From: Home Disposition: Home  Recommendations for Outpatient Follow-up:  Follow up with PCP in 1-2 weeks Started on sodium chloride 1 g p.o. twice daily for hyponatremia Please obtain BMP in one week to reassess sodium level  Home Health: No Equipment/Devices: None  Discharge Condition: Stable CODE STATUS: Full code Diet recommendation: Regular diet with 1800 mL fluid restriction  History of present illness:  [name] [name] is a 77 y.o. 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  Patient was seen by PCP with concerns for low sodium.  Her sodium was noted to be 124 and was directed to the ED for further evaluation and IV hydration.  Patient with history of recurrent symptomatic hyponatremia.  Family reports that she was mildly confused with hallucinations overnight that now have resolved.   In the ED, temperature 98.6 F, HR 87, RR 16, BP 149/120, SpO2 100% on room air.  Sodium 124, potassium 4.7, chloride 92, CO2 21, glucose 83, BUN 13, creatinine 0.88.  WBC 24.1, hemoglobin 11.1, platelets 228.  Urine sodium 36, urine osmolality 235, urine creatinine 55.  TRH consulted for admission for symptomatic hyponatremia patient was transferred to [facility] for further evaluation and management.  Hospital course:  Symptomatic hyponatremia Patient presenting to ED by direction of her PCP for low sodium associated with some confusion, "haziness".  Sodium 124 on admission.  Urine sodium 36, urine osmolality 235, serum osmolality 259.  Suspect etiology likely component of mild dehydration as well as SIADH.  Patient endorses drinking a lot of tea with  artificial sweeteners.  Patient's fluids were restricted to 1800 mL/day and was started on IV fluid hydration with improvement of sodium to 130 at time of discharge.  Will start sodium chloride 1 g p.o. twice daily with meals.  Recommend repeat BMP 1 week and outpatient follow-up with PCP.  Leukocytosis WBC count 24.1 on admission.  History of CLL.  Likely component of underlying malignancy as well as hemoconcentration in the setting of dehydration.  No signs/symptoms of active infectious process.  WBC count improved to 16.5 at time of discharge.  Outpatient follow-up with medical oncology.   CLL With medical oncology outpatient, Dr. Leonides Schanz.  Currently on Calquence 100mg  PO BID.   Essential hypertension Currently not on antihypertensives outpatient.  BP 135/54 this morning, well-controlled.   Hyperlipidemia -- Crestor 5 mg p.o. daily   Hypothyroidism TSH slightly elevated 4.523 on admission.  Free T40.99, within normal limits. Continue home levothyroxine 88 mcg p.o. daily   Depression/anxiety Rexulti 1 mg p.o. daily, Wellbutrin 300 mg p.o. daily, Luvox 100 mg p.o. nightly   Peripheral neuropathy Gabapentin 100 mg p.o. twice daily   GERD Continue PPI   Weakness/debility/deconditioning/gait disturbance: Patient currently lives at home with her husband.  Reports feeling off balance as of late, does have diagnosis of peripheral neuropathy which is likely contributing factor.  Seen by therapy with no needs identified.   Obesity Body mass index is 31.09 kg/m.  Discussed with patient needs for aggressive lifestyle changes/weight loss as this complicates all facets of care.  Outpatient follow-up with PCP.   Discharge Diagnoses:  Principal Problem:   Hyponatremia Active Problems:   Acute encephalopathy   HLD (hyperlipidemia)  Hypothyroidism   Malignant neoplasm of lower-outer quadrant of right breast of female, estrogen receptor positive (HCC)   GERD (gastroesophageal reflux  disease)   Essential hypertension   Pure hypercholesterolemia    Discharge Instructions  Discharge Instructions     Call MD for:  difficulty breathing, headache or visual disturbances   Complete by: As directed    Call MD for:  extreme fatigue   Complete by: As directed    Call MD for:  persistant dizziness or light-headedness   Complete by: As directed    Call MD for:  persistant nausea and vomiting   Complete by: As directed    Call MD for:  severe uncontrolled pain   Complete by: As directed    Call MD for:  temperature >100.4   Complete by: As directed    Diet - low sodium heart healthy   Complete by: As directed    Increase activity slowly   Complete by: As directed    Restrict fluids to 1800 mL/day      Allergies as of 12/21/2022       Reactions   Furosemide Nausea And Vomiting, Other (See Comments), Itching   HEADACHE Other Reaction(s): GI Intolerance, Other (See Comments)   Lithium Other (See Comments)   Dizziness, "cause me to fall"   Sulfa Antibiotics Hives   Trazodone And Nefazodone Other (See Comments)   insomnia   Lyrica [pregabalin] Diarrhea, Nausea Only, Rash        Medication List     TAKE these medications    allopurinol 300 MG tablet Commonly known as: ZYLOPRIM TAKE 1 TABLET(300 MG) BY MOUTH DAILY What changed: See the new instructions.   amitriptyline 100 MG tablet Commonly known as: ELAVIL Take 100 mg by mouth at bedtime.   aspirin EC 81 MG tablet Take 81 mg by mouth daily.   brexpiprazole 1 MG Tabs tablet Commonly known as: REXULTI Take 1 mg by mouth daily.   buPROPion 300 MG 24 hr tablet Commonly known as: WELLBUTRIN XL Take 300 mg by mouth daily.   Calquence 100 MG tablet Generic drug: acalabrutinib maleate Take 1 tablet (100 mg total) by mouth 2 (two) times daily.   COLLAGEN PO Take 1 capsule by mouth daily.   diclofenac Sodium 1 % Gel Commonly known as: Voltaren Apply 2 g topically 4 (four) times daily. What  changed:  when to take this reasons to take this   ferrous sulfate 325 (65 FE) MG tablet Take 325 mg by mouth daily with breakfast.   fluvoxaMINE 50 MG tablet Commonly known as: LUVOX Take 100 mg by mouth at bedtime.   gabapentin 100 MG capsule Commonly known as: NEURONTIN Take 100 mg by mouth 2 (two) times daily.   levothyroxine 88 MCG tablet Commonly known as: SYNTHROID Take 88 mcg by mouth daily before breakfast.   omeprazole 40 MG capsule Commonly known as: PRILOSEC TAKE 1 CAPSULE(40 MG) BY MOUTH DAILY AFTER BREAKFAST What changed: See the new instructions.   rosuvastatin 5 MG tablet Commonly known as: CRESTOR Take 1 tablet (5 mg total) by mouth daily.   sodium chloride 1 g tablet Take 1 tablet (1 g total) by mouth 2 (two) times daily with a meal.   Vitamin B12 1000 MCG Tbcr Take 1 tablet by mouth daily.   Vitamin D3 50 MCG (2000 UT) Tabs Take 1 tablet by mouth daily.   Womens 50+ Multi Vitamin/Min Tabs Take 1 tablet by mouth daily.   zinc gluconate 50  MG tablet Take 50 mg by mouth daily.        Follow-up Information     Merri Brunette, MD. Schedule an appointment as soon as possible for a visit in 1 week(s).   Specialty: Internal Medicine Contact information: [address] [place] [address] [phone]                Allergies  Allergen Reactions   Furosemide Nausea And Vomiting, Other (See Comments) and Itching    HEADACHE  Other Reaction(s): GI Intolerance, Other (See Comments)   Lithium Other (See Comments)    Dizziness, "cause me to fall"   Sulfa Antibiotics Hives   Trazodone And Nefazodone Other (See Comments)    insomnia   Lyrica [Pregabalin] Diarrhea, Nausea Only and Rash    Consultations: None   Procedures/Studies: No results found.   Subjective: Patient seen examined bedside, resting comfortably.  Lying in bed.  Feels well.  Eating breakfast.  No further dizziness or generalized weakness.  Sodium  up to 130.  States ready for discharge home.  Discussed starting sodium chloride tablets as well as need for fluid restriction and avoidance of liquids that could cause diuresis such as teas/coffee and artificial sweeteners.  No other specific questions or concerns at this time.  Denies headache, no dizziness, no chest pain, no palpitations, no fever/chills/night sweats, no nausea/vomiting/diarrhea, no focal weakness, no fatigue, no abdominal pain, no paresthesias.  No acute events overnight per nursing staff.  Discharge Exam: Vitals:   12/20/22 1936 12/21/22 0403  BP: 131/66 (!) 135/54  Pulse: 79 71  Resp: 18 18  Temp: 98.1 F (36.7 C) 98.3 F (36.8 C)  SpO2: 99% 97%   Vitals:   12/20/22 0443 12/20/22 1320 12/20/22 1936 12/21/22 0403  BP: (!) 128/59 125/78 131/66 (!) 135/54  Pulse: 73 79 79 71  Resp: 18 16 18 18   Temp: 98 F (36.7 C) 98.3 F (36.8 C) 98.1 F (36.7 C) 98.3 F (36.8 C)  TempSrc: Oral Oral Oral Oral  SpO2: 100% 98% 99% 97%  Weight:      Height:        Physical Exam: GEN: NAD, alert and oriented x 3, obese HEENT: NCAT, PERRL, EOMI, sclera clear, MMM PULM: CTAB w/o wheezes/crackles, normal respiratory effort CV: RRR w/o M/G/R GI: abd soft, NTND, NABS, no R/G/M MSK: no peripheral edema, muscle strength globally intact 5/5 bilateral upper/lower extremities NEURO: CN II-XII intact, no focal deficits, sensation to light touch intact PSYCH: normal mood/affect Integumentary: dry/intact, no rashes or wounds    The results of significant diagnostics from this hospitalization (including imaging, microbiology, ancillary and laboratory) are listed below for reference.     Microbiology: No results found for this or any previous visit (from the past 240 hour(s)).   Labs: BNP (last 3 results) No results for input(s): "BNP" in the last 8760 hours. Basic Metabolic Panel: Recent Labs  Lab 12/19/22 1056 12/19/22 1756 12/19/22 2335 12/20/22 0651 12/20/22 1640  12/21/22 0540  NA 124* 123* 125* 128* 132* 130*  K 4.7 4.7 3.9 3.9  --  4.0  CL 92* 94* 97* 102  --  101  CO2 21* 18* 17* 18*  --  18*  GLUCOSE 83 82 127* 89  --  102*  BUN 13 12 14 13   --  13  CREATININE 0.88 0.80 0.79 0.66  --  0.71  CALCIUM 9.7 9.0 8.8* 8.3*  --  8.6*   Liver Function Tests: Recent Labs  Lab 12/16/22 1239  AST 20  ALT 18  ALKPHOS 86  BILITOT 0.3  PROT 6.8  ALBUMIN 4.6   No results for input(s): "LIPASE", "AMYLASE" in the last 168 hours. No results for input(s): "AMMONIA" in the last 168 hours. CBC: Recent Labs  Lab 12/16/22 1239 12/19/22 1756 12/20/22 0651 12/21/22 0540  WBC 25.0* 24.1* 19.4* 16.8*  NEUTROABS 4.8  --   --   --   HGB 12.1 11.1* 10.7* 10.6*  HCT 36.8 34.1* 32.2* 32.4*  MCV 92.0 92.7 91.5 92.6  PLT 234 228 199 195   Cardiac Enzymes: No results for input(s): "CKTOTAL", "CKMB", "CKMBINDEX", "TROPONINI" in the last 168 hours. BNP: Invalid input(s): "POCBNP" CBG: No results for input(s): "GLUCAP" in the last 168 hours. D-Dimer No results for input(s): "DDIMER" in the last 72 hours. Hgb A1c No results for input(s): "HGBA1C" in the last 72 hours. Lipid Profile No results for input(s): "CHOL", "HDL", "LDLCALC", "TRIG", "CHOLHDL", "LDLDIRECT" in the last 72 hours. Thyroid function studies Recent Labs    12/19/22 1756  TSH 4.523*   Anemia work up No results for input(s): "VITAMINB12", "FOLATE", "FERRITIN", "TIBC", "IRON", "RETICCTPCT" in the last 72 hours. Urinalysis    Component Value Date/Time   COLORURINE YELLOW 09/03/2022 0141   APPEARANCEUR CLEAR 09/03/2022 0141   LABSPEC 1.014 09/03/2022 0141   LABSPEC 1.025 11/08/2013 1431   PHURINE 5.0 09/03/2022 0141   GLUCOSEU NEGATIVE 09/03/2022 0141   GLUCOSEU Negative 11/08/2013 1431   HGBUR NEGATIVE 09/03/2022 0141   BILIRUBINUR NEGATIVE 09/03/2022 0141   BILIRUBINUR negative 01/30/2016 1526   BILIRUBINUR neg 12/10/2014 1256   BILIRUBINUR Negative 11/08/2013 1431    KETONESUR NEGATIVE 09/03/2022 0141   PROTEINUR NEGATIVE 09/03/2022 0141   UROBILINOGEN 0.2 01/30/2016 1526   UROBILINOGEN 0.2 11/08/2013 1431   NITRITE NEGATIVE 09/03/2022 0141   LEUKOCYTESUR NEGATIVE 09/03/2022 0141   LEUKOCYTESUR Small 11/08/2013 1431   Sepsis Labs Recent Labs  Lab 12/16/22 1239 12/19/22 1756 12/20/22 0651 12/21/22 0540  WBC 25.0* 24.1* 19.4* 16.8*   Microbiology No results found for this or any previous visit (from the past 240 hour(s)).   Time coordinating discharge: Over 30 minutes  SIGNED:   Alvira Philips [place], DO  [facility] 12/21/2022, 9:25 AM

## 2022-12-21 NOTE — Progress Notes (Signed)
PT Cancellation Note  Patient Details Name: [name] [name] MRN: [id number] DOB: 07/20/1946   Cancelled Treatment:    Reason Eval/Treat Not Completed: PT screened, no needs identified, will sign off; pt IND per OT eval, level 6 progressive mobility. To d/c today   [facility] 12/21/2022, 10:08 AM

## 2022-12-22 NOTE — Telephone Encounter (Signed)
Patient has been scheduled for an in office pre-op appt on 12/30/2022 at 1:55 PM with Eligha Bridegroom, NP. Will route to requesting surgeons office to make them aware.

## 2022-12-22 NOTE — Telephone Encounter (Signed)
Patient returned RMA's call and schedule an appt for 05/07 at 1:55 pm with Eligha Bridegroom.

## 2022-12-25 ENCOUNTER — Other Ambulatory Visit (HOSPITAL_COMMUNITY): Payer: Self-pay

## 2022-12-25 DIAGNOSIS — H0231 Blepharochalasis right upper eyelid: Secondary | ICD-10-CM | POA: Diagnosis not present

## 2022-12-25 DIAGNOSIS — H0279 Other degenerative disorders of eyelid and periocular area: Secondary | ICD-10-CM | POA: Diagnosis not present

## 2022-12-25 DIAGNOSIS — H02132 Senile ectropion of right lower eyelid: Secondary | ICD-10-CM | POA: Diagnosis not present

## 2022-12-25 DIAGNOSIS — H0234 Blepharochalasis left upper eyelid: Secondary | ICD-10-CM | POA: Diagnosis not present

## 2022-12-25 DIAGNOSIS — H04221 Epiphora due to insufficient drainage, right lacrimal gland: Secondary | ICD-10-CM | POA: Diagnosis not present

## 2022-12-26 ENCOUNTER — Other Ambulatory Visit: Payer: Self-pay

## 2022-12-26 ENCOUNTER — Other Ambulatory Visit: Payer: Self-pay | Admitting: Hematology and Oncology

## 2022-12-26 ENCOUNTER — Other Ambulatory Visit (HOSPITAL_COMMUNITY): Payer: Self-pay

## 2022-12-26 MED ORDER — CALQUENCE 100 MG PO TABS
100.0000 mg | ORAL_TABLET | Freq: Two times a day (BID) | ORAL | 2 refills | Status: DC
  Filled 2022-12-26: qty 60, 30d supply, fill #0
  Filled 2023-02-17: qty 60, 30d supply, fill #1
  Filled 2023-03-20: qty 60, 30d supply, fill #2

## 2022-12-29 ENCOUNTER — Telehealth: Payer: Self-pay | Admitting: *Deleted

## 2022-12-29 NOTE — Telephone Encounter (Signed)
Received vm message from pt regarding getting medical clearance for Dr. Lelon Perla for back surgery.  Pt is on acalabrutinib and ASA 81 mg  Please advise

## 2022-12-30 ENCOUNTER — Ambulatory Visit: Payer: Medicare Other | Attending: Nurse Practitioner | Admitting: Nurse Practitioner

## 2022-12-30 ENCOUNTER — Encounter: Payer: Self-pay | Admitting: Nurse Practitioner

## 2022-12-30 VITALS — BP 138/72 | HR 81 | Ht 63.0 in | Wt 170.4 lb

## 2022-12-30 DIAGNOSIS — I2510 Atherosclerotic heart disease of native coronary artery without angina pectoris: Secondary | ICD-10-CM | POA: Diagnosis not present

## 2022-12-30 DIAGNOSIS — E871 Hypo-osmolality and hyponatremia: Secondary | ICD-10-CM

## 2022-12-30 DIAGNOSIS — E785 Hyperlipidemia, unspecified: Secondary | ICD-10-CM | POA: Diagnosis not present

## 2022-12-30 DIAGNOSIS — Z01810 Encounter for preprocedural cardiovascular examination: Secondary | ICD-10-CM | POA: Diagnosis not present

## 2022-12-30 NOTE — Progress Notes (Signed)
Cardiology Office Note:    Date:  12/30/2022   ID:  [name] [name], DOB 1946-08-21, MRN [id number]  PCP:  Merri Brunette, MD   [facility] Providers Cardiologist:  Donato Schultz, MD     Referring MD: Merri Brunette, MD   Chief Complaint: Preoperative cardiac evaluation  History of Present Illness:    [name] [name] is a pleasant 77 y.o. female with a hx of HTN, breast cancer s/p XRT and chemotherapy 2017, orthostatic hypotension, shortness of breath with multiple echo showing stable normal LV function, hyponatremia, nonobstructive CAD, HLD, CML, and chronic back pain.  Former patient of Dr. Myrtis Ser who saw Dr. Lennox Solders in 2017 who performed echocardiogram showing normal function without any significant valvular abnormalities.  She also had nuclear stress test in 2017 with no ischemia.  Coronary CTA 05/18/2019 showed a calcium score of 12 which was 40th percentile no evidence of flow-limiting CAD, small areas of proximal LAD calcification without any surrounding stenosis.   History of mild sinus tachycardia at rest in the past.  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  Last cardiology clinic visit was 10/30/2021 with Dr. Anne Fu at which time she was overall doing well.  BP was well-controlled and she was not on any antihypertensives.  No medication changes were incorporated and recommendation to follow-up in 2 years.  Today, she is here for preoperative cardiac evaluation for upcoming T6-T9 thoracic posterior fusion scheduled with Dr. Jordan Likes. Was previously riding recumbent bike for exercise, has not done that in several months due to back pain.  Back pain is improved when she is lying or sitting, hurts with walking/movement. Recent admission for hyponatremia, was down to 123.  It is thought to be due to her Calquence that she takes for CML.  She is able to achieve > 4 METS with housework and grocery shopping. She denies chest pain, shortness of breath, lower extremity edema, fatigue, palpitations,  melena, hematuria, hemoptysis, diaphoresis, weakness, presyncope, syncope, orthopnea, and PND. No specific cardiac complaints today.   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   DDD (degenerative disc disease)    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 ear    Depression    Diverticulosis    Elevated LFTs    Fibromyalgia 1978   GERD (gastroesophageal reflux disease)    Hematuria    History of syncope    episode 2008--  no recurrence since   HLD (hyperlipidemia)    Hypothyroidism    Kidney stones 2014   Plantar fasciitis    Ruptured disk    one in neck and two in back   S/P chemotherapy, time since greater than 12 weeks    Sinus tachycardia    mild resting   Skin cancer    nose   Stool incontinence    "at times recently"     Past Surgical History:  Procedure Laterality Date   ANTERIOR LATERAL LUMBAR FUSION 4 LEVELS Left 04/25/2016   Procedure: LEFT LUMBAR ONE-TWO, LUMBAR TWO-THREE, LUMBAR THREE-FOUR, LUMBAR FOUR-FIVE ANTERIOR LATERAL LUMBAR FUSION;  Surgeon: Julio Sicks, MD;  Location: [facility];  Service: Neurosurgery;  Laterality: Left;   APPENDECTOMY  1997  APPLICATION OF ROBOTIC ASSISTANCE FOR SPINAL PROCEDURE N/A 04/06/2017   Procedure: APPLICATION OF ROBOTIC ASSISTANCE FOR SPINAL PROCEDURE;  Surgeon: Julio Sicks, MD;  Location: [facility] OR;  Service: Neurosurgery;  Laterality: N/A;   BREAST BIOPSY Right 2014   BREAST LUMPECTOMY Right 1997   BREAST LUMPECTOMY WITH AXILLARY LYMPH NODE DISSECTION Right 1997   CATARACT EXTRACTION, BILATERAL  2019   Dr. Alben Spittle   CYSTOSCOPY WITH RETROGRADE PYELOGRAM, URETEROSCOPY AND STENT PLACEMENT Bilateral 06/17/2013   Procedure: CYSTOSCOPY WITH RETROGRADE PYELOGRAM,  URETEROSCOPY AND LEFT DOUBLE  J STENT PLACEMENT RIGHT URETERAL HOLMIIUM LASER AND DOUBLE J STENT ;  Surgeon: Lindaann Slough, MD;  Location: [facility];  Service: Urology;  Laterality: Bilateral;   HOLMIUM LASER APPLICATION Bilateral 06/17/2013   Procedure: HOLMIUM LASER APPLICATION;  Surgeon: Lindaann Slough, MD;  Location: [facility];  Service: Urology;  Laterality: Bilateral;   LITHOTRIPSY Left 06/2013   LUMBAR EPIDURAL INJECTION     has had 7 injections   PORT-A-CATH REMOVAL Left 10/03/2014   Procedure: REMOVAL PORT-A-CATH;  Surgeon: Manus Rudd, MD;  Location: [facility];  Service: General;  Laterality: Left;   PORTACATH PLACEMENT Left 07/28/2013   Procedure: ATTEMPTED INSERTION PORT-A-CATH;  Surgeon: Wilmon Arms. Corliss Skains, MD;  Location: [facility];  Service: General;  Laterality: Left;   POSTERIOR LUMBAR FUSION 4 LEVEL N/A 04/25/2016   Procedure: LUMBAR FIVE-SACRAL ONE POSTERIOR LUMBAR INTERBODY FUSION, THORACIC NINE-SACRAL ONE POSTERIOR LATERAL ARTHRODESIS WITH PEDICLE SCREWS;  Surgeon: Julio Sicks, MD;  Location: [facility];  Service: Neurosurgery;  Laterality: N/A;   RETINAL DETACHMENT SURGERY Right 2013   ROBOTIC ASSITED PARTIAL NEPHRECTOMY Right 02/05/2015   Procedure: ROBOTIC ASSITED PARTIAL NEPHRECTOMY;  Surgeon: Heloise Purpura, MD;  Location: [facility];  Service: Urology;  Laterality: Right;   SIMPLE MASTECTOMY WITH AXILLARY SENTINEL NODE BIOPSY Right 07/28/2013   Procedure: RIGHT TOTAL  MASTECTOMY;  Surgeon: Wilmon Arms. Tsuei, MD;  Location: [facility];  Service: General;  Laterality: Right;   TONSILLECTOMY  AGE 89   TOTAL ABDOMINAL HYSTERECTOMY W/ BILATERAL SALPINGOOPHORECTOMY  1997    Current Medications: Current Meds  Medication Sig   acalabrutinib maleate (CALQUENCE) 100 MG tablet Take 1 tablet (100 mg total) by mouth 2 (two) times daily.   allopurinol (ZYLOPRIM) 300 MG tablet TAKE 1 TABLET(300 MG) BY MOUTH DAILY (Patient taking differently: Take  300 mg by mouth daily.)   amitriptyline (ELAVIL) 100 MG tablet Take 100 mg by mouth at bedtime.   aspirin 81 MG EC tablet Take 81 mg by mouth daily.   brexpiprazole (REXULTI) 1 MG TABS tablet Take 1 mg by mouth daily.   buPROPion (WELLBUTRIN XL) 300 MG 24 hr tablet Take 300 mg by mouth daily.   Cholecalciferol (VITAMIN D3) 50 MCG (2000 UT) TABS Take 1 tablet by mouth daily.   COLLAGEN PO Take 1 capsule by mouth daily.   Cyanocobalamin (VITAMIN B12) 1000 MCG TBCR Take 1 tablet by mouth daily.   diclofenac Sodium (VOLTAREN) 1 % GEL Apply 2 g topically 4 (four) times daily. (Patient taking differently: Apply 2 g topically daily as needed (for pain).)   ferrous sulfate 325 (65 FE) MG tablet Take 325 mg by mouth daily with breakfast.   fluvoxaMINE (LUVOX) 50 MG tablet Take 100 mg by mouth at bedtime.   gabapentin (NEURONTIN) 100 MG capsule Take 100 mg by mouth 2 (two) times daily.   levothyroxine (SYNTHROID, LEVOTHROID) 88 MCG tablet Take 88 mcg by mouth daily before breakfast.  Multiple Vitamins-Minerals (WOMENS 50+ MULTI VITAMIN/MIN) TABS Take 1 tablet by mouth daily.   omeprazole (PRILOSEC) 40 MG capsule TAKE 1 CAPSULE(40 MG) BY MOUTH DAILY AFTER BREAKFAST (Patient taking differently: Take 40 mg by mouth daily. TAKE 1 CAPSULE(40 MG) BY MOUTH DAILY AFTER BREAKFAST)   rosuvastatin (CRESTOR) 5 MG tablet Take 1 tablet (5 mg total) by mouth daily.   sodium chloride 1 g tablet Take 1 tablet (1 g total) by mouth 2 (two) times daily with a meal.   zinc gluconate 50 MG tablet Take 50 mg by mouth daily.     Allergies:   Furosemide, Lithium, Sulfa antibiotics, Trazodone and nefazodone, and Lyrica [pregabalin]   Social History   Socioeconomic History   Marital status: Married    Spouse name: Loistine Chance   Number of children: 1   Years of education: Not on file   Highest education level: Not on file  Occupational History   Occupation: homemaker  Tobacco Use   Smoking status: Never   Smokeless tobacco:  Never  Vaping Use   Vaping Use: Never used  Substance and Sexual Activity   Alcohol use: No   Drug use: No   Sexual activity: Not Currently    Partners: Male    Birth control/protection: Surgical    Comment: TAH/BSO  Other Topics Concern   Not on file  Social History Narrative   Not on file   Social Determinants of Health   Financial Resource Strain: Not on file  Food Insecurity: No Food Insecurity (12/19/2022)   Hunger Vital Sign    Worried About Running Out of Food in the Last Year: Never true    Ran Out of Food in the Last Year: Never true  Transportation Needs: No Transportation Needs (12/19/2022)   PRAPARE - Administrator, Civil Service (Medical): No    Lack of Transportation (Non-Medical): No  Physical Activity: Not on file  Stress: Not on file  Social Connections: Not on file     Family History: The patient's family history includes Bipolar disorder in her maternal aunt; Brain cancer in her paternal grandmother; Colon cancer (age of onset: 105) in her maternal aunt; Dementia in her mother; Diabetes in her maternal aunt, maternal grandfather, and mother; Heart disease in her maternal grandfather; Osteoporosis in her mother; Prostate cancer (age of onset: 89) in her father; Stomach cancer in her paternal uncle; Stroke in her maternal aunt.  ROS:   Please see the history of present illness.    + chronic back pain All other systems reviewed and are negative.  Labs/Other Studies Reviewed:    The following studies were reviewed today:  Cardiac Studies & Procedures     STRESS TESTS  MYOCARDIAL PERFUSION IMAGING 08/29/2016   ECHOCARDIOGRAM  ECHOCARDIOGRAM COMPLETE 05/03/2019  Narrative ECHOCARDIOGRAM REPORT   IMPRESSIONS   1. The left ventricle has normal systolic function with an ejection fraction of 60-65%. The cavity size was normal. Left ventricular diastolic Doppler parameters are consistent with impaired relaxation. 2. The right ventricle has  normal systolic function. The cavity was normal. There is no increase in right ventricular wall thickness. 3. No evidence of mitral valve stenosis. 4. The tricuspid valve is grossly normal. 5. The aortic valve is tricuspid. Mild thickening of the aortic valve. No stenosis of the aortic valve. 6. The aorta is normal unless otherwise noted. 7. The aortic root and ascending aorta are normal in size and structure. 8. The atrial septum is grossly normal. 9. The average  left ventricular global longitudinal strain is -19.8 %.  FINDINGS Left Ventricle: The left ventricle has normal systolic function, with an ejection fraction of 60-65%. The cavity size was normal. There is no increase in left ventricular wall thickness. Left ventricular diastolic Doppler parameters are consistent with impaired relaxation. The average left ventricular global longitudinal strain is -19.8 %.  Right Ventricle: The right ventricle has normal systolic function. The cavity was normal. There is no increase in right ventricular wall thickness.  Left Atrium: Left atrial size was normal in size.  Right Atrium: Right atrial size was normal in size. Right atrial pressure is estimated at 10 mmHg.  Interatrial Septum: The atrial septum is grossly normal.  Pericardium: There is no evidence of pericardial effusion.  Mitral Valve: The mitral valve is normal in structure. Mitral valve regurgitation is not visualized by color flow Doppler. No evidence of mitral valve stenosis.  Tricuspid Valve: The tricuspid valve is grossly normal. Tricuspid valve regurgitation is trivial by color flow Doppler.  Aortic Valve: The aortic valve is tricuspid Mild thickening of the aortic valve. Aortic valve regurgitation was not visualized by color flow Doppler. There is No stenosis of the aortic valve.  Pulmonic Valve: The pulmonic valve was grossly normal. Pulmonic valve regurgitation is not visualized by color flow Doppler.  Aorta: The aortic  root and ascending aorta are normal in size and structure. The aorta is normal unless otherwise noted.      CT SCANS  CT CORONARY MORPH W/CTA COR W/SCORE 05/18/2019  Addendum 05/18/2019  5:15 PM ADDENDUM REPORT: 05/18/2019 17:12  CLINICAL DATA:  77 year old with chest pain. PAC's noted at baseline.  EXAM: Cardiac/Coronary  CTA  TECHNIQUE: The patient was scanned on a [employer].  FINDINGS: A 100 kV prospective scan was triggered in the descending thoracic aorta at 111 HU's. Axial non-contrast 3 mm slices were carried out through the heart. The data set was analyzed on a dedicated work station and scored using the Agatson method. Gantry rotation speed was 250 msecs and collimation was .6 mm. 100 mg of metoprolol beta blockade and 0.8 mg of sl NTG was given. The 3D data set was reconstructed in 5% intervals of the 67-82 % of the R-R cycle. Diastolic phases were analyzed on a dedicated work station using MPR, MIP and VRT modes. The patient received 80 cc of contrast.  Aorta: Normal size. Mild grade 1 atherosclerosis distal to RCA ostium. No dissection.  Aortic Valve:  Trileaflet.  No calcifications.  Coronary Arteries:  Normal coronary origin.  Right dominance.  RCA is a large dominant artery that gives rise to PDA and PLA. There is no plaque or flow limiting disease. RCA was best seen in 35% R-R interval.  Left main is a large artery that gives rise to LAD and LCX arteries. There is no flow limiting disease.  LAD is a large vessel that gives rise to 3 diagonal branches which are small in caliber. The ostial and proximal LAD demonstrates small areas of calcified plaque with no evidence of flow limitation.  LCX is a non-dominant artery that gives rise to one large OM1 branch. There are OM2 and OM3 small caliber branches. There is no flow limiting disease present.  Other findings:  Normal pulmonary vein drainage into the left atrium.  Normal left atrial  appendage without a thrombus.  Normal size of the pulmonary artery.  IMPRESSION: 1. Coronary calcium score of 12. This was 40 percentile for age and sex matched  control.  2. Normal coronary origin with right dominance.  3. No evidence of flow limiting CAD. Small areas of proximal LAD calcification with no surrounding stenosis. Continue with risk factor modification.  Donato Schultz, MD   Electronically Signed By: Donato Schultz MD On: 05/18/2019 17:12  Narrative EXAM: OVER-READ INTERPRETATION  CT CHEST  The following report is an over-read performed by radiologist Dr. Charlett Nose of [facility] on 05/18/2019. This over-read does not include interpretation of cardiac or coronary anatomy or pathology. The coronary CTA interpretation by the cardiologist is attached.  COMPARISON:  None.  FINDINGS: Vascular: Heart is normal size.  Visualized aorta normal caliber.  Mediastinum/Nodes: No adenopathy in the lower mediastinum and hilum. Small hiatal hernia.  Lungs/Pleura: Visualized lungs clear.  No effusions.  Upper Abdomen: Imaging into the upper abdomen shows no acute findings.  Musculoskeletal: Prior right mastectomy.  No acute bony abnormality.  IMPRESSION: No acute extra cardiac abnormality.  Small hiatal hernia.  Electronically Signed: By: Charlett Nose M.D. On: 05/18/2019 16:22           Recent Labs: 12/16/2022: ALT 18 12/19/2022: TSH 4.523 12/21/2022: BUN 13; Creatinine, Ser 0.71; Hemoglobin 10.6; Platelets 195; Potassium 4.0; Sodium 130  Recent Lipid Panel No results found for: "CHOL", "TRIG", "HDL", "CHOLHDL", "VLDL", "LDLCALC", "LDLDIRECT"   Risk Assessment/Calculations:           Physical Exam:    VS:  BP 138/72   Pulse 81   Ht 5\' 3"  (1.6 m)   Wt 170 lb 6.4 oz (77.3 kg)   LMP 08/26/1995   SpO2 97%   BMI 30.19 kg/m     Wt Readings from Last 3 Encounters:  12/30/22 170 lb 6.4 oz (77.3 kg)  12/19/22 175 lb 7.8 oz (79.6 kg)   12/16/22 171 lb (77.6 kg)     GEN:  Well nourished, well developed in no acute distress HEENT: Normal NECK: No JVD; No carotid bruits CARDIAC: RRR, no murmurs, rubs, gallops RESPIRATORY:  Clear to auscultation without rales, wheezing or rhonchi  ABDOMEN: Soft, non-tender, non-distended MUSCULOSKELETAL:  No edema; No deformity. 2+ pedal pulses, equal bilaterally SKIN: Warm and dry NEUROLOGIC:  Alert and oriented x 3 PSYCHIATRIC:  Normal affect   EKG:  EKG is ordered today.  The ekg ordered today demonstrates normal sinus rhythm at 78 bpm, no ST abnormality      Diagnoses:    1. Preoperative cardiovascular examination   2. Coronary artery disease involving native coronary artery of native heart without angina pectoris   3. Hyponatremia   4. Hyperlipidemia LDL goal <70    Assessment and Plan:     Preoperative cardiac evaluation: According to the Revised Cardiac Risk Index (RCRI), her Perioperative Risk of Major Cardiac Event is (%): 0.9. Her Functional Capacity in METs is: 5.07 according to the Duke Activity Status Index (DASI). The patient is doing well from a cardiac perspective. Therefore, based on ACC/AHA guidelines, the patient would be at acceptable risk for the planned procedure without further cardiovascular testing. Per office protocol, she may hold aspirin for 7 days prior to procedure and should resume as soon as hemodynamically stable postoperatively. I will route to requesting provider.   CAD without angina: Coronary CT a 04/2019 with coronary calcium score of 12, 40th percentile for age/sex, normal coronary origin with right dominance, no flow-limiting CAD, small areas of proximal LAD calcification with no surrounding stenosis. Activity is limited by back pain but she is able to achieve >  4 METS without concerning cardiac symptoms. She denies chest pain, dyspnea, or other symptoms concerning for angina.  No indication for further ischemic evaluation at this time.    Hyperlipidemia LDL goal < 70: No recent lipid panel to review. Recommend lipid testing at next office visit.  Continue rosuvastatin.  Hyponatremia: Recent admission for hyponatremia with NA as low as 123, improved to 130 on 12/21/2022.  She has been placed on sodium chloride tablets. Admits she eats out frequently, encouraged her to avoid dietary sodium.    Disposition: 1 year with Dr. Anne Fu  Medication Adjustments/Labs and Tests Ordered: Current medicines are reviewed at length with the patient today.  Concerns regarding medicines are outlined above.  Orders Placed This Encounter  Procedures   EKG 12-Lead   No orders of the defined types were placed in this encounter.   Patient Instructions  Medication Instructions:  Your physician recommends that you continue on your current medications as directed. Please refer to the Current Medication list given to you today.  *If you need a refill on your cardiac medications before your next appointment, please call your pharmacy*   Lab Work: NONE If you have labs (blood work) drawn today and your tests are completely normal, you will receive your results only by: MyChart Message (if you have MyChart) OR A paper copy in the mail If you have any lab test that is abnormal or we need to change your treatment, we will call you to review the results.   Testing/Procedures: EKG   Follow-Up: At [facility], you and your health needs are our priority.  As part of our continuing mission to provide you with exceptional heart care, we have created designated Provider Care Teams.  These Care Teams include your primary Cardiologist (physician) and Advanced Practice Providers (APPs -  Physician Assistants and Nurse Practitioners) who all work together to provide you with the care you need, when you need it.  We recommend signing up for the patient portal called "MyChart".  Sign up information is provided on this After Visit Summary.  MyChart  is used to connect with patients for Virtual Visits (Telemedicine).  Patients are able to view lab/test results, encounter notes, upcoming appointments, etc.  Non-urgent messages can be sent to your provider as well.   To learn more about what you can do with MyChart, go to [email or web address].    Your next appointment:   1 year(s)  Provider:   Donato Schultz, MD     Signed, Levi Aland, NP  12/30/2022 5:31 PM     [facility]

## 2022-12-30 NOTE — Patient Instructions (Signed)
Medication Instructions:  Your physician recommends that you continue on your current medications as directed. Please refer to the Current Medication list given to you today.  *If you need a refill on your cardiac medications before your next appointment, please call your pharmacy*   Lab Work: NONE If you have labs (blood work) drawn today and your tests are completely normal, you will receive your results only by: MyChart Message (if you have MyChart) OR A paper copy in the mail If you have any lab test that is abnormal or we need to change your treatment, we will call you to review the results.   Testing/Procedures: EKG   Follow-Up: At [facility], you and your health needs are our priority.  As part of our continuing mission to provide you with exceptional heart care, we have created designated Provider Care Teams.  These Care Teams include your primary Cardiologist (physician) and Advanced Practice Providers (APPs -  Physician Assistants and Nurse Practitioners) who all work together to provide you with the care you need, when you need it.  We recommend signing up for the patient portal called "MyChart".  Sign up information is provided on this After Visit Summary.  MyChart is used to connect with patients for Virtual Visits (Telemedicine).  Patients are able to view lab/test results, encounter notes, upcoming appointments, etc.  Non-urgent messages can be sent to your provider as well.   To learn more about what you can do with MyChart, go to [email or web address].    Your next appointment:   1 year(s)  Provider:   Donato Schultz, MD

## 2022-12-31 ENCOUNTER — Telehealth: Payer: Self-pay | Admitting: *Deleted

## 2022-12-31 DIAGNOSIS — F3341 Major depressive disorder, recurrent, in partial remission: Secondary | ICD-10-CM | POA: Diagnosis not present

## 2022-12-31 DIAGNOSIS — F411 Generalized anxiety disorder: Secondary | ICD-10-CM | POA: Diagnosis not present

## 2022-12-31 NOTE — Telephone Encounter (Signed)
TCT patient regarding her upcoming back surgery and medical clearance from Dr. Leonides Schanz. Pt is ok for surgery as long as she stops her Calquence 7 days prior to surgery and 7 days after surgery. Pt states she will hold hold her Aspirin 81 mg as well. Advised that I would be faxing the clearance letter to Dr. Dutch Quint this afternoon. Pt has gotten Cardiology clearance as well.

## 2023-01-01 ENCOUNTER — Other Ambulatory Visit: Payer: Self-pay

## 2023-01-01 DIAGNOSIS — E039 Hypothyroidism, unspecified: Secondary | ICD-10-CM | POA: Diagnosis not present

## 2023-01-01 DIAGNOSIS — Z09 Encounter for follow-up examination after completed treatment for conditions other than malignant neoplasm: Secondary | ICD-10-CM | POA: Diagnosis not present

## 2023-01-01 DIAGNOSIS — E871 Hypo-osmolality and hyponatremia: Secondary | ICD-10-CM | POA: Diagnosis not present

## 2023-01-08 ENCOUNTER — Other Ambulatory Visit: Payer: Self-pay | Admitting: Neurosurgery

## 2023-01-14 DIAGNOSIS — R7303 Prediabetes: Secondary | ICD-10-CM | POA: Diagnosis not present

## 2023-01-14 DIAGNOSIS — I2584 Coronary atherosclerosis due to calcified coronary lesion: Secondary | ICD-10-CM | POA: Diagnosis not present

## 2023-01-14 DIAGNOSIS — E039 Hypothyroidism, unspecified: Secondary | ICD-10-CM | POA: Diagnosis not present

## 2023-01-14 DIAGNOSIS — E871 Hypo-osmolality and hyponatremia: Secondary | ICD-10-CM | POA: Diagnosis not present

## 2023-01-21 ENCOUNTER — Other Ambulatory Visit (HOSPITAL_COMMUNITY): Payer: Self-pay

## 2023-01-21 NOTE — Progress Notes (Signed)
Surgical Instructions    Your procedure is scheduled on Monday 02/02/23.   Report to [facility] Main Entrance "A" at 09:10 A.M., then check in with the Admitting office.  Call this number if you have problems the morning of surgery:  [phone]   If you have any questions prior to your surgery date call [phone]: Open Monday-Friday 8am-4pm If you experience any cold or flu symptoms such as cough, fever, chills, shortness of breath, etc. between now and your scheduled surgery, please notify us at the above number     Remember:  Do not eat or drink after midnight the night before your surgery     Take these medicines the morning of surgery with A SIP OF WATER:   allopurinol (ZYLOPRIM)   brexpiprazole (REXULTI)   buPROPion (WELLBUTRIN XL)   gabapentin (NEURONTIN)   levothyroxine (SYNTHROID, LEVOTHROID)   omeprazole (PRILOSEC)   rosuvastatin (CRESTOR)    Please follow your surgeon's instructions regarding Aspirin. If you have not received instructions then please contact your surgeon's office for instructions.   Please follow your surgeon's instructions regarding acalabrutinib maleate (CALQUENCE). If you have not received instructions then please contact your surgeon's office for instructions.   As of today, STOP taking any Aspirin (unless otherwise instructed by your surgeon) Aleve, Naproxen, Ibuprofen, Motrin, Advil, Goody's, BC's, all herbal medications, fish oil, and all vitamins.           Do not wear jewelry or makeup. Do not wear lotions, powders, perfumes/cologne or deodorant. Do not shave 48 hours prior to surgery.  Men may shave face and neck. Do not bring valuables to the hospital. Do not wear nail polish, gel polish, artificial nails, or any other type of covering on natural nails (fingers and toes) If you have artificial nails or gel coating that need to be removed by a nail salon, please have this removed prior to surgery. Artificial nails or gel coating may  interfere with anesthesia's ability to adequately monitor your vital signs.  [facility] is not responsible for any belongings or valuables.    Do NOT Smoke (Tobacco/Vaping)  24 hours prior to your procedure  If you use a CPAP at night, you may bring your mask for your overnight stay.   Contacts, glasses, hearing aids, dentures or partials may not be worn into surgery, please bring cases for these belongings   For patients admitted to the hospital, discharge time will be determined by your treatment team.   Patients discharged the day of surgery will not be allowed to drive home, and someone needs to stay with them for 24 hours.   SURGICAL WAITING ROOM VISITATION Patients having surgery or a procedure may have no more than 2 support people in the waiting area - these visitors may rotate.   Children under the age of 49 must have an adult with them who is not the patient. If the patient needs to stay at the hospital during part of their recovery, the visitor guidelines for inpatient rooms apply. Pre-op nurse will coordinate an appropriate time for 1 support person to accompany patient in pre-op.  This support person may not rotate.   Please refer to [email or web address] for the visitor guidelines for Inpatients (after your surgery is over and you are in a regular room).    Special instructions:    Oral Hygiene is also important to reduce your risk of infection.  Remember - BRUSH YOUR TEETH THE MORNING OF SURGERY WITH YOUR REGULAR TOOTHPASTE   [facility]- Preparing For Surgery  Before surgery, you can play an important role. Because skin is not sterile, your skin needs to be as free of germs as possible. You can reduce the number of germs on your skin by washing with CHG (chlorahexidine gluconate) Soap before surgery.  CHG is an antiseptic cleaner which kills germs and bonds with the skin to continue killing germs even after washing.      Please do not use if you have an allergy to CHG or antibacterial soaps. If your skin becomes reddened/irritated stop using the CHG.  Do not shave (including legs and underarms) for at least 48 hours prior to first CHG shower. It is OK to shave your face.  Please follow these instructions carefully.     Shower the NIGHT BEFORE SURGERY and the MORNING OF SURGERY with CHG Soap.   If you chose to wash your hair, wash your hair first as usual with your normal shampoo. After you shampoo, rinse your hair and body thoroughly to remove the shampoo.  Then [employer] and genitals (private parts) with your normal soap and rinse thoroughly to remove soap.  After that Use CHG Soap as you would any other liquid soap. You can apply CHG directly to the skin and wash gently with a scrungie or a clean washcloth.   Apply the CHG Soap to your body ONLY FROM THE NECK DOWN.  Do not use on open wounds or open sores. Avoid contact with your eyes, ears, mouth and genitals (private parts). Wash Face and genitals (private parts)  with your normal soap.   Wash thoroughly, paying special attention to the area where your surgery will be performed.  Thoroughly rinse your body with warm water from the neck down.  DO NOT shower/wash with your normal soap after using and rinsing off the CHG Soap.  Pat yourself dry with a CLEAN TOWEL.  Wear CLEAN PAJAMAS to bed the night before surgery  Place CLEAN SHEETS on your bed the night before your surgery  DO NOT SLEEP WITH PETS.   Day of Surgery:  Take a shower with CHG soap. Wear Clean/Comfortable clothing the morning of surgery Do not apply any deodorants/lotions.   Remember to brush your teeth WITH YOUR REGULAR TOOTHPASTE.    If you received a COVID test during your pre-op visit, it is requested that you wear a mask when out in public, stay away from anyone that may not be feeling well, and notify your surgeon if you develop symptoms. If you have been in contact  with anyone that has tested positive in the last 10 days, please notify your surgeon.    Please read over the following fact sheets that you were given.

## 2023-01-22 ENCOUNTER — Encounter (HOSPITAL_COMMUNITY): Payer: Self-pay

## 2023-01-22 ENCOUNTER — Encounter (HOSPITAL_COMMUNITY)
Admission: RE | Admit: 2023-01-22 | Discharge: 2023-01-22 | Disposition: A | Discharge status: Home / Self Care | Payer: Medicare Other | Source: Ambulatory Visit | Attending: Neurosurgery | Admitting: Neurosurgery

## 2023-01-22 ENCOUNTER — Other Ambulatory Visit: Payer: Self-pay

## 2023-01-22 VITALS — BP 125/68 | HR 88 | Temp 98.4°F | Resp 17 | Ht 63.0 in | Wt 171.0 lb

## 2023-01-22 DIAGNOSIS — Z853 Personal history of malignant neoplasm of breast: Secondary | ICD-10-CM | POA: Insufficient documentation

## 2023-01-22 DIAGNOSIS — Z9221 Personal history of antineoplastic chemotherapy: Secondary | ICD-10-CM | POA: Diagnosis not present

## 2023-01-22 DIAGNOSIS — I10 Essential (primary) hypertension: Secondary | ICD-10-CM | POA: Insufficient documentation

## 2023-01-22 DIAGNOSIS — Z856 Personal history of leukemia: Secondary | ICD-10-CM | POA: Insufficient documentation

## 2023-01-22 DIAGNOSIS — I2510 Atherosclerotic heart disease of native coronary artery without angina pectoris: Secondary | ICD-10-CM | POA: Insufficient documentation

## 2023-01-22 DIAGNOSIS — Z01812 Encounter for preprocedural laboratory examination: Secondary | ICD-10-CM | POA: Diagnosis not present

## 2023-01-22 DIAGNOSIS — E785 Hyperlipidemia, unspecified: Secondary | ICD-10-CM | POA: Diagnosis not present

## 2023-01-22 DIAGNOSIS — Z923 Personal history of irradiation: Secondary | ICD-10-CM | POA: Diagnosis not present

## 2023-01-22 DIAGNOSIS — Z7982 Long term (current) use of aspirin: Secondary | ICD-10-CM | POA: Diagnosis not present

## 2023-01-22 DIAGNOSIS — Z01818 Encounter for other preprocedural examination: Secondary | ICD-10-CM

## 2023-01-22 LAB — BASIC METABOLIC PANEL
Anion gap: 12 (ref 5–15)
BUN: 13 mg/dL (ref 8–23)
CO2: 20 mmol/L — ABNORMAL LOW (ref 22–32)
Calcium: 9.3 mg/dL (ref 8.9–10.3)
Chloride: 98 mmol/L (ref 98–111)
Creatinine, Ser: 1.01 mg/dL — ABNORMAL HIGH (ref 0.44–1.00)
GFR, Estimated: 57 mL/min — ABNORMAL LOW (ref >60)
Glucose, Bld: 100 mg/dL — ABNORMAL HIGH (ref 70–99)
Potassium: 4.7 mmol/L (ref 3.5–5.1)
Sodium: 130 mmol/L — ABNORMAL LOW (ref 135–145)

## 2023-01-22 LAB — TYPE AND SCREEN
ABO/RH(D): O POS
Antibody Screen: NEGATIVE

## 2023-01-22 LAB — CBC
HCT: 39.2 % (ref 36.0–46.0)
Hemoglobin: 12.2 g/dL (ref 12.0–15.0)
MCH: 30.3 pg (ref 26.0–34.0)
MCHC: 31.1 g/dL (ref 30.0–36.0)
MCV: 97.5 fL (ref 80.0–100.0)
Platelets: 249 10*3/uL (ref 150–400)
RBC: 4.02 MIL/uL (ref 3.87–5.11)
RDW: 14.8 % (ref 11.5–15.5)
WBC: 22 10*3/uL — ABNORMAL HIGH (ref 4.0–10.5)
nRBC: 0.1 % (ref 0.0–0.2)

## 2023-01-22 LAB — SURGICAL PCR SCREEN
MRSA, PCR: NEGATIVE
Staphylococcus aureus: NEGATIVE

## 2023-01-22 NOTE — Progress Notes (Signed)
Erie Noe, scheduler for Dr. Dutch Quint made aware of WBC of 22,000 and sodium of 130.  She will advise Dr. Dutch Quint.

## 2023-01-22 NOTE — Progress Notes (Addendum)
PCP - Dr. Merri Brunette Cardiologist - Dr. Chales Abrahams  PPM/ICD - Denies  Chest x-ray - 09/03/2022 EKG - 12/30/2022 Stress Test - 2017 ECHO - 05/03/2019 Cardiac Cath -   Sleep Study - Denies CPAP - Denies  Non-diabetic  Blood Thinner Instructions:Denies Aspirin Instructions:Per surgeon stop 7 days prior to surgery  ERAS Protcol -no, NPO PRE-SURGERY Ensure or G2-   COVID TEST- n/a  Anesthesia review: Yes, Leukemia, chronic cough  Patient denies shortness of breath, fever, cough and chest pain at PAT appointment   All instructions explained to the patient, with a verbal understanding of the material. Patient agrees to go over the instructions while at home for a better understanding. Patient also instructed to self quarantine after being tested for COVID-19. The opportunity to ask questions was provided.

## 2023-01-23 NOTE — Anesthesia Preprocedure Evaluation (Addendum)
Anesthesia Evaluation  Patient identified by MRN, date of birth, ID band Patient awake    Reviewed: Allergy & Precautions, NPO status , Patient's Chart, lab work & pertinent test results  History of Anesthesia Complications Negative for: history of anesthetic complications  Airway Mallampati: II  TM Distance: >3 FB Neck ROM: Full    Dental  (+) Dental Advisory Given   Pulmonary neg pulmonary ROS   breath sounds clear to auscultation       Cardiovascular hypertension, + CAD (non-obstructive)   Rhythm:Regular Rate:Normal  '20 ECHO:  1. The LV EF 60-65%. The cavity size was normal.   2. The RV has normal systolic function. The cavity was normal. There is no increase in right ventricular wall thickness.   3. No evidence of mitral valve stenosis.   4. The tricuspid valve is grossly normal.   5. The AV is tricuspid. Mild thickening of the AV. No stenosis of the aortic valve.     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)    GI/Hepatic Neg liver ROS,GERD  Medicated and Controlled,,  Endo/Other  Hypothyroidism  BMI 30  Renal/GU negative Renal ROS     Musculoskeletal  (+) Arthritis ,  Fibromyalgia -  Abdominal   Peds  Hematology CLL   Anesthesia Other Findings   Reproductive/Obstetrics                             Anesthesia Physical Anesthesia Plan  ASA: 3  Anesthesia Plan: General   Post-op Pain Management: Tylenol PO (pre-op)*   Induction: Intravenous  PONV Risk Score and Plan: 3 and Ondansetron, Dexamethasone and Treatment may vary due to age or medical condition  Airway Management Planned: Oral ETT  Additional Equipment: None  Intra-op Plan:   Post-operative Plan: Extubation in OR  Informed Consent: I have reviewed the patients History and Physical, chart, labs and discussed the procedure including the risks, benefits and  alternatives for the proposed anesthesia with the patient or authorized representative who has indicated his/her understanding and acceptance.     Dental advisory given  Plan Discussed with: CRNA and Surgeon  Anesthesia Plan Comments: (PAT note by Antionette Poles, PA-C:   77 y.o. female with a hx of HTN, breast cancer s/p XRT and chemotherapy 2017, orthostatic hypotension, shortness of breath with multiple echo showing stable normal LV function, hyponatremia, nonobstructive CAD, HLD, CML, and chronic back pain.   Seen by cardiology APP Eligha Bridegroom, NP 12/30/2022 for preop evaluation.  Per note, "Preoperative cardiac evaluation: According to the Revised Cardiac Risk Index (RCRI), her Perioperative Risk of Major Cardiac Event is (%): 0.9. Her Functional Capacity in METs is: 5.07 according to the Duke Activity Status Index (DASI). The patient is doing well from a cardiac perspective. Therefore, based on ACC/AHA guidelines, the patient would be at acceptable risk for the planned procedure without further cardiovascular testing. Per office protocol, she may hold aspirin for 7 days prior to procedure and should resume as soon as hemodynamically stable postoperatively. I will route to requesting provider."  Follows with hematologist Dr. Leonides Schanz for history of CLL.  She is maintained on acalabrutinib 100 mg twice daily.  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  Sodium Date Value 01/22/2023 130 mmol/L (L) 12/21/2022 130 mmol/L (L) 12/20/2022 132 mmol/L (L) 12/20/2022 128 mmol/L (L) 12/19/2022 125  mmol/L (L)  WBC (K/uL) Date Value 01/22/2023 22.0 (H) 12/21/2022 16.8 (H) 12/20/2022 19.4 (H) 12/19/2022 24.1 (H) 09/06/2022 69.7 (HH)  EKG 12/30/2022: NSR.  Rate 78.  Coronary CTA 05/18/2019: IMPRESSION: 1. Coronary calcium score of 12. This was 40 percentile for age and sex matched control.  2.  Normal coronary origin with right dominance.  3. No evidence of flow limiting CAD. Small areas of proximal LAD calcification with no surrounding stenosis. Continue with risk factor modification.  TTE 05/03/2019: 1. The left ventricle has normal systolic function with an ejection  fraction of 60-65%. The cavity size was normal. Left ventricular diastolic  Doppler parameters are consistent with impaired relaxation.  2. The right ventricle has normal systolic function. The cavity was  normal. There is no increase in right ventricular wall thickness.  3. No evidence of mitral valve stenosis.  4. The tricuspid valve is grossly normal.  5. The aortic valve is tricuspid. Mild thickening of the aortic valve. No  stenosis of the aortic valve.  6. The aorta is normal unless otherwise noted.  7. The aortic root and ascending aorta are normal in size and structure.  8. The atrial septum is grossly normal.  9. The average left ventricular global longitudinal strain is -19.8 %.   )        Anesthesia Quick Evaluation

## 2023-01-23 NOTE — Progress Notes (Signed)
Anesthesia Chart Review:   77 y.o. female with a hx of HTN, breast cancer s/p XRT and chemotherapy 2017, orthostatic hypotension, shortness of breath with multiple echo showing stable normal LV function, hyponatremia, nonobstructive CAD, HLD, CML, and chronic back pain.   Seen by cardiology APP Eligha Bridegroom, NP 12/30/2022 for preop evaluation.  Per note, "Preoperative cardiac evaluation: According to the Revised Cardiac Risk Index (RCRI), her Perioperative Risk of Major Cardiac Event is (%): 0.9. Her Functional Capacity in METs is: 5.07 according to the Duke Activity Status Index (DASI). The patient is doing well from a cardiac perspective. Therefore, based on ACC/AHA guidelines, the patient would be at acceptable risk for the planned procedure without further cardiovascular testing. Per office protocol, she may hold aspirin for 7 days prior to procedure and should resume as soon as hemodynamically stable postoperatively. I will route to requesting provider."  Follows with hematologist Dr. Leonides Schanz for history of CLL.  She is maintained on acalabrutinib 100 mg twice daily.  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  Sodium  Date Value  01/22/2023 130 mmol/L (L)  12/21/2022 130 mmol/L (L)  12/20/2022 132 mmol/L (L)  12/20/2022 128 mmol/L (L)  12/19/2022 125 mmol/L (L)   WBC (K/uL)  Date Value  01/22/2023 22.0 (H)  12/21/2022 16.8 (H)  12/20/2022 19.4 (H)  12/19/2022 24.1 (H)  09/06/2022 69.7 (HH)   EKG 12/30/2022: NSR.  Rate 78.  Coronary CTA 05/18/2019: IMPRESSION: 1. Coronary calcium score of 12. This was 40 percentile for age and sex matched control.   2. Normal coronary origin with right dominance.   3. No evidence of flow limiting CAD. Small areas of proximal LAD calcification with no surrounding stenosis. Continue with risk factor modification.  TTE 05/03/2019:  1. The left  ventricle has normal systolic function with an ejection  fraction of 60-65%. The cavity size was normal. Left ventricular diastolic  Doppler parameters are consistent with impaired relaxation.   2. The right ventricle has normal systolic function. The cavity was  normal. There is no increase in right ventricular wall thickness.   3. No evidence of mitral valve stenosis.   4. The tricuspid valve is grossly normal.   5. The aortic valve is tricuspid. Mild thickening of the aortic valve. No  stenosis of the aortic valve.   6. The aorta is normal unless otherwise noted.   7. The aortic root and ascending aorta are normal in size and structure.   8. The atrial septum is grossly normal.   9. The average left ventricular global longitudinal strain is -19.8 %.     Zannie Cove [facility] Short Stay Center/Anesthesiology Phone [phone] 01/23/2023 10:29 AM

## 2023-01-26 ENCOUNTER — Other Ambulatory Visit: Payer: Self-pay | Admitting: Physician Assistant

## 2023-01-26 DIAGNOSIS — C9110 Chronic lymphocytic leukemia of B-cell type not having achieved remission: Secondary | ICD-10-CM

## 2023-01-27 ENCOUNTER — Inpatient Hospital Stay (HOSPITAL_BASED_OUTPATIENT_CLINIC_OR_DEPARTMENT_OTHER): Payer: Medicare Other | Admitting: Physician Assistant

## 2023-01-27 ENCOUNTER — Inpatient Hospital Stay: Payer: Medicare Other | Attending: Hematology and Oncology

## 2023-01-27 ENCOUNTER — Other Ambulatory Visit: Payer: Self-pay

## 2023-01-27 VITALS — BP 106/60 | HR 93 | Temp 97.3°F | Resp 16 | Wt 168.2 lb

## 2023-01-27 DIAGNOSIS — Z79899 Other long term (current) drug therapy: Secondary | ICD-10-CM | POA: Insufficient documentation

## 2023-01-27 DIAGNOSIS — Z9011 Acquired absence of right breast and nipple: Secondary | ICD-10-CM | POA: Diagnosis not present

## 2023-01-27 DIAGNOSIS — C9110 Chronic lymphocytic leukemia of B-cell type not having achieved remission: Secondary | ICD-10-CM

## 2023-01-27 DIAGNOSIS — Z853 Personal history of malignant neoplasm of breast: Secondary | ICD-10-CM | POA: Diagnosis not present

## 2023-01-27 LAB — CBC WITH DIFFERENTIAL (CANCER CENTER ONLY)
Abs Immature Granulocytes: 0.06 10*3/uL (ref 0.00–0.07)
Basophils Absolute: 0.1 10*3/uL (ref 0.0–0.1)
Basophils Relative: 0 %
Eosinophils Absolute: 0.3 10*3/uL (ref 0.0–0.5)
Eosinophils Relative: 2 %
HCT: 37.1 % (ref 36.0–46.0)
Hemoglobin: 12.3 g/dL (ref 12.0–15.0)
Immature Granulocytes: 0 %
Lymphocytes Relative: 65 %
Lymphs Abs: 12 10*3/uL — ABNORMAL HIGH (ref 0.7–4.0)
MCH: 31.3 pg (ref 26.0–34.0)
MCHC: 33.2 g/dL (ref 30.0–36.0)
MCV: 94.4 fL (ref 80.0–100.0)
Monocytes Absolute: 0.7 10*3/uL (ref 0.1–1.0)
Monocytes Relative: 4 %
Neutro Abs: 5.2 10*3/uL (ref 1.7–7.7)
Neutrophils Relative %: 29 %
Platelet Count: 227 10*3/uL (ref 150–400)
RBC: 3.93 MIL/uL (ref 3.87–5.11)
RDW: 14.7 % (ref 11.5–15.5)
Smear Review: NORMAL
WBC Count: 18.4 10*3/uL — ABNORMAL HIGH (ref 4.0–10.5)
nRBC: 0 % (ref 0.0–0.2)

## 2023-01-27 LAB — CMP (CANCER CENTER ONLY)
ALT: 18 U/L (ref 0–44)
AST: 20 U/L (ref 15–41)
Albumin: 4.7 g/dL (ref 3.5–5.0)
Alkaline Phosphatase: 93 U/L (ref 38–126)
Anion gap: 7 (ref 5–15)
BUN: 13 mg/dL (ref 8–23)
CO2: 25 mmol/L (ref 22–32)
Calcium: 9.8 mg/dL (ref 8.9–10.3)
Chloride: 103 mmol/L (ref 98–111)
Creatinine: 1.02 mg/dL — ABNORMAL HIGH (ref 0.44–1.00)
GFR, Estimated: 57 mL/min — ABNORMAL LOW (ref >60)
Glucose, Bld: 116 mg/dL — ABNORMAL HIGH (ref 70–99)
Potassium: 4.3 mmol/L (ref 3.5–5.1)
Sodium: 135 mmol/L (ref 135–145)
Total Bilirubin: 0.5 mg/dL (ref 0.3–1.2)
Total Protein: 6.7 g/dL (ref 6.5–8.1)

## 2023-01-27 LAB — LACTATE DEHYDROGENASE: LDH: 129 U/L (ref 98–192)

## 2023-01-27 NOTE — Progress Notes (Signed)
[facility] Telephone:([phone]) [phone]   [phone]  PROGRESS NOTE  Patient Care Team: Merri Brunette, MD as PCP - General (Internal Medicine) Jake Bathe, MD as PCP - Cardiology (Cardiology) Rachael Fee, MD as Attending Physician (Gastroenterology) Jerene Bears, MD as Consulting Physician (Gynecology) Manus Rudd, MD as Consulting Physician (General Surgery) Ellis Savage, NP as Nurse Practitioner Samson Frederic, MD as Consulting Physician (Orthopedic Surgery) Ranee Gosselin, MD as Consulting Physician (Orthopedic Surgery) Elenora Fender, MD as Attending Physician (Radiology) Jeronimo Norma, MD as Referring Physician (Orthopedic Surgery) Jaci Standard, MD as Consulting Physician (Hematology and Oncology)  Hematological/Oncological History # CLL Rai Stage 1  06/04/2021: last visit with Dr. Darnelle Catalan. Detailed history of his care history noted below.  09/18/2021: WBC 43.3, Hgb 13.0, MCV 87.1, Plt 275 09/23/2021: establish care with Dr. Leonides Schanz. WBC 30.1, Hgb 12.3, MCV 87.8, Plt 241 03/11/2022: WBC 57.4, Hgb 11.2, MCV 88.3, Plt 181  04/29/2022: Started acalabrutinib 100 mg PO twice daily  Interval History:  [name] [name] 77 y.o. 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 The patient's last visit was on 12/16/2022. In the interim since the last visit she continues on acalabrutinib.  On exam today [name] is accompanied by her husband. She reports that she is doing well without any new symptoms. She has ongoing back pain and is planning to undergo T6-T9 spinal fusion on 02/02/2023. She is holding acalabrutinib 7 days before and after surgery along with her aspirin. She denies any appetite changes or weight loss. She is able to complete her ADLs on her own. 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 She denies any fevers,  chills, sweats, shortness of breath, chest pain, cough, nausea, vomiting, diarrhea, constipation, headaches or dizziness. She has no other complaints. Rest of 10 point ROS is listed below.   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   DDD (degenerative disc disease)    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 ear    Depression    Diverticulosis    Elevated LFTs    Fibromyalgia 1978   GERD (gastroesophageal reflux disease)    Hematuria    History of syncope    episode 2008--  no recurrence since   HLD (hyperlipidemia)    Hypothyroidism    Kidney stones 2014   Plantar fasciitis    Ruptured disk    one in neck and two in back   S/P chemotherapy, time since greater than 12 weeks    Sinus tachycardia    mild resting   Skin cancer    nose   Stool incontinence    "at times recently"     SURGICAL HISTORY: Past Surgical History:  Procedure Laterality Date   ANTERIOR LATERAL LUMBAR FUSION 4 LEVELS Left 04/25/2016   Procedure: LEFT LUMBAR ONE-TWO, LUMBAR TWO-THREE, LUMBAR THREE-FOUR, LUMBAR FOUR-FIVE ANTERIOR LATERAL LUMBAR FUSION;  Surgeon: Julio Sicks, MD;  Location: [facility];  Service: Neurosurgery;  Laterality: Left;   APPENDECTOMY  1997   APPLICATION OF  ROBOTIC ASSISTANCE FOR SPINAL PROCEDURE N/A 04/06/2017   Procedure: APPLICATION OF ROBOTIC ASSISTANCE FOR SPINAL PROCEDURE;  Surgeon: Julio Sicks, MD;  Location: [facility];  Service: Neurosurgery;  Laterality: N/A;   BREAST BIOPSY Right 2014   BREAST LUMPECTOMY Right 1997   BREAST LUMPECTOMY WITH AXILLARY LYMPH NODE DISSECTION Right 1997   CATARACT EXTRACTION, BILATERAL  2019   Dr. Alben Spittle   CYSTOSCOPY WITH RETROGRADE PYELOGRAM, URETEROSCOPY AND  STENT PLACEMENT Bilateral 06/17/2013   Procedure: CYSTOSCOPY WITH RETROGRADE PYELOGRAM, URETEROSCOPY AND LEFT DOUBLE  J STENT PLACEMENT RIGHT URETERAL HOLMIIUM LASER AND DOUBLE J STENT ;  Surgeon: Lindaann Slough, MD;  Location: [facility];  Service: Urology;  Laterality: Bilateral;   HOLMIUM LASER APPLICATION Bilateral 06/17/2013   Procedure: HOLMIUM LASER APPLICATION;  Surgeon: Lindaann Slough, MD;  Location: [facility];  Service: Urology;  Laterality: Bilateral;   LITHOTRIPSY Left 06/2013   LUMBAR EPIDURAL INJECTION     has had 7 injections   PORT-A-CATH REMOVAL Left 10/03/2014   Procedure: REMOVAL PORT-A-CATH;  Surgeon: Manus Rudd, MD;  Location: [facility];  Service: General;  Laterality: Left;   PORTACATH PLACEMENT Left 07/28/2013   Procedure: ATTEMPTED INSERTION PORT-A-CATH;  Surgeon: Wilmon Arms. Corliss Skains, MD;  Location: [facility] OR;  Service: General;  Laterality: Left;   POSTERIOR LUMBAR FUSION 4 LEVEL N/A 04/25/2016   Procedure: LUMBAR FIVE-SACRAL ONE POSTERIOR LUMBAR INTERBODY FUSION, THORACIC NINE-SACRAL ONE POSTERIOR LATERAL ARTHRODESIS WITH PEDICLE SCREWS;  Surgeon: Julio Sicks, MD;  Location: [facility];  Service: Neurosurgery;  Laterality: N/A;   RETINAL DETACHMENT SURGERY Right 2013   ROBOTIC ASSITED PARTIAL NEPHRECTOMY Right 02/05/2015   Procedure: ROBOTIC ASSITED PARTIAL NEPHRECTOMY;  Surgeon: Heloise Purpura, MD;  Location: [facility];  Service: Urology;  Laterality: Right;   SIMPLE MASTECTOMY WITH AXILLARY SENTINEL NODE BIOPSY Right 07/28/2013   Procedure: RIGHT TOTAL  MASTECTOMY;  Surgeon: Wilmon Arms. Corliss Skains, MD;  Location: [facility];  Service: General;  Laterality: Right;   TONSILLECTOMY  AGE 89   TOTAL ABDOMINAL HYSTERECTOMY W/ BILATERAL SALPINGOOPHORECTOMY  1997    SOCIAL HISTORY: Social History   Socioeconomic History   Marital status: Married    Spouse name: Philip   Number of children: 1   Years of education: Not on file   Highest  education level: Not on file  Occupational History   Occupation: homemaker  Tobacco Use   Smoking status: Never   Smokeless tobacco: Never  Vaping Use   Vaping Use: Never used  Substance and Sexual Activity   Alcohol use: No   Drug use: No   Sexual activity: Not Currently    Partners: Male    Birth control/protection: Surgical    Comment: TAH/BSO  Other Topics Concern   Not on file  Social History Narrative   Not on file   Social Determinants of Health   Financial Resource Strain: Not on file  Food Insecurity: No Food Insecurity (12/19/2022)   Hunger Vital Sign    Worried About Running Out of Food in the Last Year: Never true    Ran Out of Food in the Last Year: Never true  Transportation Needs: No Transportation Needs (12/19/2022)   PRAPARE - Administrator, Civil Service (Medical): No    Lack of Transportation (Non-Medical): No  Physical Activity: Not on file  Stress: Not on file  Social Connections: Not on file  Intimate Partner Violence: Not At Risk (12/19/2022)   Humiliation, Afraid, Rape, and Kick questionnaire  Fear of Current or Ex-Partner: No    Emotionally Abused: No    Physically Abused: No    Sexually Abused: No    FAMILY HISTORY: Family History  Problem Relation Age of Onset   Heart disease Maternal Grandfather    Diabetes Maternal Grandfather    Colon cancer Maternal Aunt 74   Brain cancer Paternal Grandmother        dx in 43s   Dementia Mother    Diabetes Mother    Osteoporosis Mother    Diabetes Maternal Aunt    Prostate cancer Father 85   Bipolar disorder Maternal Aunt    Stroke Maternal Aunt    Stomach cancer Paternal Uncle        dx in late 39s    ALLERGIES:  is allergic to furosemide, lithium, sulfa antibiotics, trazodone and nefazodone, and lyrica [pregabalin].  MEDICATIONS:  Current Outpatient Medications  Medication Sig Dispense Refill   acalabrutinib maleate (CALQUENCE) 100 MG tablet Take 1 tablet (100 mg total) by  mouth 2 (two) times daily. 60 tablet 2   allopurinol (ZYLOPRIM) 300 MG tablet TAKE 1 TABLET(300 MG) BY MOUTH DAILY (Patient taking differently: Take 300 mg by mouth daily.) 90 tablet 4   amitriptyline (ELAVIL) 100 MG tablet Take 100 mg by mouth at bedtime.     aspirin 81 MG EC tablet Take 81 mg by mouth daily.     brexpiprazole (REXULTI) 1 MG TABS tablet Take 1 mg by mouth daily.     buPROPion (WELLBUTRIN XL) 300 MG 24 hr tablet Take 300 mg by mouth daily.     Cholecalciferol (VITAMIN D3) 50 MCG (2000 UT) TABS Take 2,000 Units by mouth daily.     COLLAGEN PO Take 4 tablets by mouth daily. Vital protein gummy     Cyanocobalamin (VITAMIN B12) 1000 MCG TBCR Take 1,000 Units by mouth daily.     ferrous sulfate 325 (65 FE) MG tablet Take 325 mg by mouth daily with breakfast.     fluvoxaMINE (LUVOX) 50 MG tablet Take 100 mg by mouth at bedtime.     gabapentin (NEURONTIN) 100 MG capsule Take 200 mg by mouth 2 (two) times daily.     ibuprofen (ADVIL) 200 MG tablet Take 600 mg by mouth in the morning.     levothyroxine (SYNTHROID, LEVOTHROID) 88 MCG tablet Take 88 mcg by mouth daily before breakfast.  4   Multiple Vitamins-Minerals (WOMENS 50+ MULTI VITAMIN/MIN) TABS Take 1 tablet by mouth daily.     omeprazole (PRILOSEC) 40 MG capsule TAKE 1 CAPSULE(40 MG) BY MOUTH DAILY AFTER BREAKFAST (Patient taking differently: Take 40 mg by mouth daily. TAKE 1 CAPSULE(40 MG) BY MOUTH DAILY AFTER BREAKFAST) 90 capsule 0   rosuvastatin (CRESTOR) 5 MG tablet Take 1 tablet (5 mg total) by mouth daily. 30 tablet 8   sodium chloride 1 g tablet Take 1 tablet (1 g total) by mouth 2 (two) times daily with a meal. (Patient taking differently: Take 1 g by mouth daily.) 60 tablet 2   zinc gluconate 50 MG tablet Take 50 mg by mouth daily.     diclofenac Sodium (VOLTAREN) 1 % GEL Apply 2 g topically 4 (four) times daily. (Patient not taking: Reported on 01/20/2023) 2 g 3   No current facility-administered medications for this  visit.    REVIEW OF SYSTEMS:   Constitutional: ( - ) fevers, ( - )  chills , ( - ) night sweats Eyes: ( - ) blurriness of vision, ( - )  double vision, ( - ) watery eyes Ears, nose, mouth, throat, and face: ( - ) mucositis, ( - ) sore throat Respiratory: ( - ) cough, ( - ) dyspnea, ( - ) wheezes Cardiovascular: ( - ) palpitation, ( - ) chest discomfort, ( - ) lower extremity swelling Gastrointestinal:  ( - ) nausea, ( - ) heartburn, ( - ) change in bowel habits Skin: ( - ) abnormal skin rashes Lymphatics: ( - ) new lymphadenopathy, ( - ) easy bruising Neurological: ( - ) numbness, ( - ) tingling, ( - ) new weaknesses Behavioral/Psych: ( - ) mood change, ( - ) new changes  All other systems were reviewed with the patient and are negative.  PHYSICAL EXAMINATION:  Vitals:   01/27/23 1256  BP: 106/60  Pulse: 93  Resp: 16  Temp: (!) 97.3 F (36.3 C)  SpO2: 99%      Filed Weights   01/27/23 1256  Weight: 168 lb 3.2 oz (76.3 kg)      GENERAL: well appearing elderly Caucasian female. alert, no distress and comfortable SKIN:  skin color, texture, turgor are normal, no rashes or significant lesions EYES: conjunctiva are pink and non-injected, sclera clear LUNGS: clear to auscultation and percussion with normal breathing effort HEART: regular rate & rhythm and no murmurs and no lower extremity edema Musculoskeletal: no cyanosis of digits and no clubbing  PSYCH: alert & oriented x 3, fluent speech NEURO: no focal motor/sensory deficits  LABORATORY DATA:  I have reviewed the data as listed    Latest Ref Rng & Units 01/27/2023   12:38 PM 01/22/2023    1:06 PM 12/21/2022    5:40 AM  CBC  WBC 4.0 - 10.5 K/uL 18.4  22.0  16.8   Hemoglobin 12.0 - 15.0 g/dL 95.6  21.3  08.6   Hematocrit 36.0 - 46.0 % 37.1  39.2  32.4   Platelets 150 - 400 K/uL 227  249  195        Latest Ref Rng & Units 01/27/2023   12:38 PM 01/22/2023    1:06 PM 12/21/2022    5:40 AM  CMP  Glucose 70 - 99 mg/dL  578  469  629   BUN 8 - 23 mg/dL 13  13  13    Creatinine 0.44 - 1.00 mg/dL 5.28  4.13  2.44   Sodium 135 - 145 mmol/L 135  130  130   Potassium 3.5 - 5.1 mmol/L 4.3  4.7  4.0   Chloride 98 - 111 mmol/L 103  98  101   CO2 22 - 32 mmol/L 25  20  18    Calcium 8.9 - 10.3 mg/dL 9.8  9.3  8.6   Total Protein 6.5 - 8.1 g/dL 6.7     Total Bilirubin 0.3 - 1.2 mg/dL 0.5     Alkaline Phos 38 - 126 U/L 93     AST 15 - 41 U/L 20     ALT 0 - 44 U/L 18       RADIOGRAPHIC STUDIES: No results found.  ASSESSMENT & PLAN [name] [name] is a 77 y.o. 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  History Adapted from Dr. Darrall Dears last note:   (1) status post right breast lumpectomy and axillary lymph node dissection in 1997 for a stage I breast cancer, treated with adjuvant radiation and tamoxifen for 5 years   (2) status post right  breast lower outer quadrant biopsy 07/13/2013 for a clinical T1c N0, stage IA invasive ductal carcinoma, grade 2, estrogen receptor 100% positive, progesterone receptor 13% positive, with an MIB-1 of 33%, and HER-2 amplification by CISH with a HER2/CEP 17 ratio of 3.19, and an average HER-2 copy number per cell of 4.15   (3) status post right mastectomy 07/28/2013 for a pT1c pN0, stage IA invasive ductal carcinoma, grade 3, with close but negative margins. Prognostic panel was not repeated             (a) the patient met with Dr. Odis Luster and has decided against reconstruction   (4) completed weekly paclitaxel x12 12/06/2913, with trastuzumab/ pertuzumab every 3 weeks; pertuzumab was held with third dose on 11/01/2013 due to diarrhea, tried a half dose on cycle 4 again with diarrhea developing   (5) trastuzumab (started 09/20/2013) continued for 1 year, last dose 09/21/2014;             (a) final echocardiogram 09/07/2014 showed an ejection fraction of 55-60%   (6) anastrozole started May 2015; completed  April 2020             (a) bone density 12/15/2013 normal             (b) bone density 02/01/2016 at [facility] was normal with a T score of -1.0     OTHER PROBLEMS: (a) History of chronic lymphoid leukemia diagnosed by flow cytometry 06/29/2013, the cells being CD5, CD20 and CD23 positive, CD10 negative.              (1) right renal mass resected on 02/05/15, consisting of atypical lymphoid proliferation composed of monotonous small lymphocytes              (2) Anemia with a normal MCV and normal ferritin-- B-12 and folate normal; stable             (3)  ibrutinib 280 milligrams daily started 03/26/2019, discontinued 06/03/2019 because of concerns regarding dosing             (4) rituximab weekly x8 started 06/15/2019 completed December 2020             (5) maintenance rituximab (Q 2 months) started 10/25/2019, continued through 08/03/2020  (6) 04/29/2022: Started acalabrutinib 100 mg PO twice daily   # CLL Rai Stage I --Due to rapid doubling time of white blood cell count from 28.3 on 10/07/2021 to 66.7 on 04/09/2022, recommendation was to initiate therapy with acalabrutinib 100 mg twice daily, started on 04/29/2022 --Last imaging of the abdomen in 2022 showed no evidence of splenomegaly Plan:  --Labs today show WBC 18.4, hemoglobin 12.3, MCV 94.4, and platelets of 227.  Creatinine stable at 1.02, LFTS normal.  --Currently holding acalabrutinib for 7 days prior to upcoming back surgery on 02/02/2023 and will resume 7 days after. Continue on same dosage with acalabrutinib at 100 mg twice daily. --RTC in 4 weeks with labs.   #Head throbbing-resolved.  --Patient reports throbbing sensation without headaches. Likely secondary to acalabrutinib. After temporarily holding medication, symptoms have improved.  --Advised that headaches respond well to caffeine and trying to drink a caffeinated beverage. --Monitor for now and advised to follow up if symptoms worsen.  # ER + Breast Cancer in Survivorship --  Continue yearly mammograms. --last mammogram on 06/09/2022, next due in October 2024.  --Currently in survivorship.  No orders of the defined types were placed in this encounter.  All questions were answered. The patient knows  to call the clinic with any problems, questions or concerns.  I have spent a total of 25 minutes minutes of face-to-face and non-face-to-face time, preparing to see the patient, performing a medically appropriate examination, counseling and educating the patient, ordering tests/procedures, documenting clinical information in the electronic health record, and care coordination.   Georga Kaufmann PA-C Dept of Hematology and Oncology [facility] Phone: [phone]    01/27/2023 1:46 PM

## 2023-01-28 DIAGNOSIS — C9110 Chronic lymphocytic leukemia of B-cell type not having achieved remission: Secondary | ICD-10-CM | POA: Diagnosis not present

## 2023-01-28 DIAGNOSIS — Z01818 Encounter for other preprocedural examination: Secondary | ICD-10-CM | POA: Diagnosis not present

## 2023-01-28 DIAGNOSIS — R7303 Prediabetes: Secondary | ICD-10-CM | POA: Diagnosis not present

## 2023-01-28 DIAGNOSIS — I2510 Atherosclerotic heart disease of native coronary artery without angina pectoris: Secondary | ICD-10-CM | POA: Diagnosis not present

## 2023-01-29 ENCOUNTER — Telehealth: Payer: Self-pay | Admitting: Cardiology

## 2023-01-29 NOTE — Telephone Encounter (Signed)
Patient states she went to her PCP discovered she has a heart murmur and she would like to know if Dr. Anne Fu has recommendations, and if the murmur will interfere with her having back surgery on Monday.

## 2023-01-29 NOTE — Telephone Encounter (Signed)
Call received in [facility] Triage  Pt states she went into see her PCP prior to back surgery, and Katie PA-C heard a heart murmur.   Pt wants Dr. Anne Fu to be made aware of this, and wanted to know if he had any concerns or recommendations?  Pt surgery is on Monday.    Pt advised that I will forward her question to Dr. Anne Fu and his RN for review, and that they will follow up with her in the near future prior to her surgery.  Follow up required.

## 2023-02-02 ENCOUNTER — Inpatient Hospital Stay (HOSPITAL_COMMUNITY): Payer: Medicare Other

## 2023-02-02 ENCOUNTER — Encounter (HOSPITAL_COMMUNITY): Payer: Self-pay | Admitting: Neurosurgery

## 2023-02-02 ENCOUNTER — Inpatient Hospital Stay (HOSPITAL_COMMUNITY): Payer: Medicare Other | Admitting: Certified Registered"

## 2023-02-02 ENCOUNTER — Other Ambulatory Visit: Payer: Self-pay

## 2023-02-02 ENCOUNTER — Encounter (HOSPITAL_COMMUNITY)
Admission: RE | Disposition: A | Discharge status: Home / Self Care | Payer: Self-pay | Source: Home / Self Care | Attending: Neurosurgery

## 2023-02-02 ENCOUNTER — Inpatient Hospital Stay (HOSPITAL_COMMUNITY)
Admission: RE | Admit: 2023-02-02 | Discharge: 2023-02-03 | DRG: 460 | Disposition: A | Discharge status: Home / Self Care | Payer: Medicare Other | Attending: Neurosurgery | Admitting: Neurosurgery

## 2023-02-02 ENCOUNTER — Inpatient Hospital Stay (HOSPITAL_COMMUNITY): Payer: Medicare Other | Admitting: Physician Assistant

## 2023-02-02 DIAGNOSIS — Z79899 Other long term (current) drug therapy: Secondary | ICD-10-CM | POA: Diagnosis not present

## 2023-02-02 DIAGNOSIS — Z79890 Hormone replacement therapy (postmenopausal): Secondary | ICD-10-CM

## 2023-02-02 DIAGNOSIS — Z981 Arthrodesis status: Secondary | ICD-10-CM | POA: Diagnosis not present

## 2023-02-02 DIAGNOSIS — Z882 Allergy status to sulfonamides status: Secondary | ICD-10-CM | POA: Diagnosis not present

## 2023-02-02 DIAGNOSIS — Z87442 Personal history of urinary calculi: Secondary | ICD-10-CM | POA: Diagnosis not present

## 2023-02-02 DIAGNOSIS — I10 Essential (primary) hypertension: Secondary | ICD-10-CM | POA: Diagnosis not present

## 2023-02-02 DIAGNOSIS — K219 Gastro-esophageal reflux disease without esophagitis: Secondary | ICD-10-CM | POA: Diagnosis present

## 2023-02-02 DIAGNOSIS — M532X4 Spinal instabilities, thoracic region: Secondary | ICD-10-CM | POA: Diagnosis not present

## 2023-02-02 DIAGNOSIS — Z808 Family history of malignant neoplasm of other organs or systems: Secondary | ICD-10-CM | POA: Diagnosis not present

## 2023-02-02 DIAGNOSIS — Z856 Personal history of leukemia: Secondary | ICD-10-CM

## 2023-02-02 DIAGNOSIS — Z833 Family history of diabetes mellitus: Secondary | ICD-10-CM | POA: Diagnosis not present

## 2023-02-02 DIAGNOSIS — Z800 Family history of malignant neoplasm of digestive organs: Secondary | ICD-10-CM | POA: Diagnosis not present

## 2023-02-02 DIAGNOSIS — Z823 Family history of stroke: Secondary | ICD-10-CM | POA: Diagnosis not present

## 2023-02-02 DIAGNOSIS — Z8042 Family history of malignant neoplasm of prostate: Secondary | ICD-10-CM

## 2023-02-02 DIAGNOSIS — Z8249 Family history of ischemic heart disease and other diseases of the circulatory system: Secondary | ICD-10-CM

## 2023-02-02 DIAGNOSIS — I2510 Atherosclerotic heart disease of native coronary artery without angina pectoris: Secondary | ICD-10-CM

## 2023-02-02 DIAGNOSIS — E039 Hypothyroidism, unspecified: Secondary | ICD-10-CM | POA: Diagnosis present

## 2023-02-02 DIAGNOSIS — M5134 Other intervertebral disc degeneration, thoracic region: Principal | ICD-10-CM

## 2023-02-02 DIAGNOSIS — M47814 Spondylosis without myelopathy or radiculopathy, thoracic region: Principal | ICD-10-CM | POA: Diagnosis present

## 2023-02-02 DIAGNOSIS — Z8262 Family history of osteoporosis: Secondary | ICD-10-CM | POA: Diagnosis not present

## 2023-02-02 DIAGNOSIS — M797 Fibromyalgia: Secondary | ICD-10-CM | POA: Diagnosis present

## 2023-02-02 DIAGNOSIS — Z9221 Personal history of antineoplastic chemotherapy: Secondary | ICD-10-CM

## 2023-02-02 DIAGNOSIS — Z853 Personal history of malignant neoplasm of breast: Secondary | ICD-10-CM | POA: Diagnosis not present

## 2023-02-02 DIAGNOSIS — Z888 Allergy status to other drugs, medicaments and biological substances status: Secondary | ICD-10-CM

## 2023-02-02 DIAGNOSIS — Z85828 Personal history of other malignant neoplasm of skin: Secondary | ICD-10-CM | POA: Diagnosis not present

## 2023-02-02 DIAGNOSIS — Z7982 Long term (current) use of aspirin: Secondary | ICD-10-CM

## 2023-02-02 DIAGNOSIS — E785 Hyperlipidemia, unspecified: Secondary | ICD-10-CM | POA: Diagnosis not present

## 2023-02-02 HISTORY — PX: LAMINECTOMY WITH POSTERIOR LATERAL ARTHRODESIS LEVEL 3: SHX6337

## 2023-02-02 SURGERY — LAMINECTOMY WITH POSTERIOR LATERAL ARTHRODESIS LEVEL 3
Anesthesia: General | Site: Back

## 2023-02-02 MED ORDER — PHENYLEPHRINE HCL-NACL 20-0.9 MG/250ML-% IV SOLN
INTRAVENOUS | Status: AC
  Filled 2023-02-02: qty 500

## 2023-02-02 MED ORDER — FLEET ENEMA 7-19 GM/118ML RE ENEM: 1.0000 | ENEMA | Freq: Once | RECTAL | Status: DC | PRN

## 2023-02-02 MED ORDER — PHENYLEPHRINE 80 MCG/ML (10ML) SYRINGE FOR IV PUSH (FOR BLOOD PRESSURE SUPPORT)
PREFILLED_SYRINGE | INTRAVENOUS | Status: AC
  Filled 2023-02-02: qty 10

## 2023-02-02 MED ORDER — MEPERIDINE HCL 25 MG/ML IJ SOLN: 6.2500 mg | INTRAMUSCULAR | Status: DC | PRN

## 2023-02-02 MED ORDER — GABAPENTIN 100 MG PO CAPS
200.0000 mg | ORAL_CAPSULE | Freq: Two times a day (BID) | ORAL | Status: DC
  Administered 2023-02-02 – 2023-02-03 (×2): 200 mg via ORAL
  Filled 2023-02-02 (×2): qty 2

## 2023-02-02 MED ORDER — BISACODYL 10 MG RE SUPP: 10.0000 mg | Freq: Every day | RECTAL | Status: DC | PRN

## 2023-02-02 MED ORDER — SODIUM CHLORIDE 0.9 % IV SOLN
250.0000 mL | INTRAVENOUS | Status: DC
  Administered 2023-02-02: 250 mL via INTRAVENOUS

## 2023-02-02 MED ORDER — CEFAZOLIN SODIUM-DEXTROSE 2-4 GM/100ML-% IV SOLN
2.0000 g | INTRAVENOUS | Status: AC
  Administered 2023-02-02: 2 g via INTRAVENOUS
  Filled 2023-02-02: qty 100

## 2023-02-02 MED ORDER — ONDANSETRON HCL 4 MG/2ML IJ SOLN: 4.0000 mg | Freq: Four times a day (QID) | INTRAMUSCULAR | Status: DC | PRN

## 2023-02-02 MED ORDER — ACETAMINOPHEN 650 MG RE SUPP: 650.0000 mg | RECTAL | Status: DC | PRN

## 2023-02-02 MED ORDER — OXYCODONE HCL 5 MG/5ML PO SOLN: 5.0000 mg | Freq: Once | ORAL | Status: AC | PRN

## 2023-02-02 MED ORDER — POLYETHYLENE GLYCOL 3350 17 G PO PACK
17.0000 g | PACK | Freq: Every day | ORAL | Status: DC | PRN
  Filled 2023-02-02: qty 1

## 2023-02-02 MED ORDER — THROMBIN 5000 UNITS EX SOLR
CUTANEOUS | Status: AC
  Filled 2023-02-02: qty 5000

## 2023-02-02 MED ORDER — PROMETHAZINE HCL 25 MG/ML IJ SOLN: 6.2500 mg | INTRAMUSCULAR | Status: DC | PRN

## 2023-02-02 MED ORDER — HYDROMORPHONE HCL 1 MG/ML IJ SOLN
INTRAMUSCULAR | Status: DC | PRN
  Administered 2023-02-02: .5 mg via INTRAVENOUS

## 2023-02-02 MED ORDER — ADULT MULTIVITAMIN W/MINERALS CH
1.0000 | ORAL_TABLET | Freq: Every day | ORAL | Status: DC
  Administered 2023-02-02 – 2023-02-03 (×2): 1 via ORAL
  Filled 2023-02-02 (×2): qty 1

## 2023-02-02 MED ORDER — PHENOL 1.4 % MT LIQD: 1.0000 | OROMUCOSAL | Status: DC | PRN

## 2023-02-02 MED ORDER — ACETAMINOPHEN 325 MG PO TABS
650.0000 mg | ORAL_TABLET | ORAL | Status: DC | PRN
  Administered 2023-02-02 – 2023-02-03 (×2): 650 mg via ORAL
  Filled 2023-02-02 (×2): qty 2

## 2023-02-02 MED ORDER — PROPOFOL 10 MG/ML IV BOLUS
INTRAVENOUS | Status: AC
  Filled 2023-02-02: qty 20

## 2023-02-02 MED ORDER — OXYCODONE HCL 5 MG PO TABS
5.0000 mg | ORAL_TABLET | Freq: Once | ORAL | Status: AC | PRN
  Administered 2023-02-02: 5 mg via ORAL

## 2023-02-02 MED ORDER — ROCURONIUM BROMIDE 10 MG/ML (PF) SYRINGE
PREFILLED_SYRINGE | INTRAVENOUS | Status: DC | PRN
  Administered 2023-02-02: 60 mg via INTRAVENOUS
  Administered 2023-02-02: 20 mg via INTRAVENOUS

## 2023-02-02 MED ORDER — ASPIRIN 81 MG PO TBEC
81.0000 mg | DELAYED_RELEASE_TABLET | Freq: Every day | ORAL | Status: DC
  Administered 2023-02-03: 81 mg via ORAL
  Filled 2023-02-02: qty 1

## 2023-02-02 MED ORDER — LIDOCAINE 2% (20 MG/ML) 5 ML SYRINGE
INTRAMUSCULAR | Status: AC
  Filled 2023-02-02: qty 5

## 2023-02-02 MED ORDER — BREXPIPRAZOLE 1 MG PO TABS
1.0000 mg | ORAL_TABLET | Freq: Every day | ORAL | Status: DC
  Administered 2023-02-03: 1 mg via ORAL
  Filled 2023-02-02 (×2): qty 1

## 2023-02-02 MED ORDER — ROSUVASTATIN CALCIUM 5 MG PO TABS
5.0000 mg | ORAL_TABLET | Freq: Every day | ORAL | Status: DC
  Administered 2023-02-03: 5 mg via ORAL
  Filled 2023-02-02: qty 1

## 2023-02-02 MED ORDER — HYDROMORPHONE HCL 1 MG/ML IJ SOLN
INTRAMUSCULAR | Status: AC
  Filled 2023-02-02: qty 1

## 2023-02-02 MED ORDER — PROPOFOL 1000 MG/100ML IV EMUL
INTRAVENOUS | Status: AC
  Filled 2023-02-02: qty 100

## 2023-02-02 MED ORDER — BUPIVACAINE HCL (PF) 0.25 % IJ SOLN
INTRAMUSCULAR | Status: AC
  Filled 2023-02-02: qty 30

## 2023-02-02 MED ORDER — PHENYLEPHRINE HCL-NACL 20-0.9 MG/250ML-% IV SOLN
INTRAVENOUS | Status: DC | PRN
  Administered 2023-02-02: 50 ug/min via INTRAVENOUS

## 2023-02-02 MED ORDER — HYDROCODONE-ACETAMINOPHEN 10-325 MG PO TABS
1.0000 | ORAL_TABLET | ORAL | Status: DC | PRN
  Administered 2023-02-02: 1 via ORAL
  Filled 2023-02-02: qty 1

## 2023-02-02 MED ORDER — PANTOPRAZOLE SODIUM 40 MG PO TBEC
80.0000 mg | DELAYED_RELEASE_TABLET | Freq: Every day | ORAL | Status: DC
  Administered 2023-02-03: 80 mg via ORAL
  Filled 2023-02-02: qty 2

## 2023-02-02 MED ORDER — CHLORHEXIDINE GLUCONATE 0.12 % MT SOLN
15.0000 mL | Freq: Once | OROMUCOSAL | Status: AC
  Administered 2023-02-02: 15 mL via OROMUCOSAL
  Filled 2023-02-02: qty 15

## 2023-02-02 MED ORDER — DIAZEPAM 5 MG PO TABS: 5.0000 mg | ORAL_TABLET | Freq: Four times a day (QID) | ORAL | Status: DC | PRN

## 2023-02-02 MED ORDER — CYANOCOBALAMIN 500 MCG PO TABS
1000.0000 ug | ORAL_TABLET | Freq: Every day | ORAL | Status: DC
  Administered 2023-02-02 – 2023-02-03 (×2): 1000 ug via ORAL
  Filled 2023-02-02 (×2): qty 2

## 2023-02-02 MED ORDER — DEXAMETHASONE SODIUM PHOSPHATE 10 MG/ML IJ SOLN
INTRAMUSCULAR | Status: DC | PRN
  Administered 2023-02-02: 5 mg via INTRAVENOUS

## 2023-02-02 MED ORDER — THROMBIN 20000 UNITS EX SOLR
CUTANEOUS | Status: DC | PRN
  Administered 2023-02-02: 20 mL via TOPICAL

## 2023-02-02 MED ORDER — ACETAMINOPHEN 500 MG PO TABS
1000.0000 mg | ORAL_TABLET | Freq: Once | ORAL | Status: AC
  Administered 2023-02-02: 1000 mg via ORAL
  Filled 2023-02-02: qty 2

## 2023-02-02 MED ORDER — EPHEDRINE 5 MG/ML INJ
INTRAVENOUS | Status: AC
  Filled 2023-02-02: qty 5

## 2023-02-02 MED ORDER — MIDAZOLAM HCL 2 MG/2ML IJ SOLN: 0.5000 mg | Freq: Once | INTRAMUSCULAR | Status: DC | PRN

## 2023-02-02 MED ORDER — ORAL CARE MOUTH RINSE: 15.0000 mL | Freq: Once | OROMUCOSAL | Status: AC

## 2023-02-02 MED ORDER — SUGAMMADEX SODIUM 200 MG/2ML IV SOLN
INTRAVENOUS | Status: DC | PRN
  Administered 2023-02-02: 160 mg via INTRAVENOUS

## 2023-02-02 MED ORDER — VANCOMYCIN HCL 1000 MG IV SOLR
INTRAVENOUS | Status: AC
  Filled 2023-02-02: qty 20

## 2023-02-02 MED ORDER — FLUVOXAMINE MALEATE 100 MG PO TABS
100.0000 mg | ORAL_TABLET | Freq: Every day | ORAL | Status: DC
  Administered 2023-02-02: 100 mg via ORAL
  Filled 2023-02-02 (×2): qty 1

## 2023-02-02 MED ORDER — FENTANYL CITRATE (PF) 250 MCG/5ML IJ SOLN
INTRAMUSCULAR | Status: DC | PRN
  Administered 2023-02-02: 150 ug via INTRAVENOUS

## 2023-02-02 MED ORDER — ZINC GLUCONATE 50 MG PO TABS: 50.0000 mg | ORAL_TABLET | Freq: Every day | ORAL | Status: DC

## 2023-02-02 MED ORDER — MENTHOL 3 MG MT LOZG: 1.0000 | LOZENGE | OROMUCOSAL | Status: DC | PRN

## 2023-02-02 MED ORDER — 0.9 % SODIUM CHLORIDE (POUR BTL) OPTIME
TOPICAL | Status: DC | PRN
  Administered 2023-02-02: 1000 mL

## 2023-02-02 MED ORDER — OXYCODONE HCL 5 MG PO TABS
10.0000 mg | ORAL_TABLET | ORAL | Status: DC | PRN
  Administered 2023-02-02 – 2023-02-03 (×5): 10 mg via ORAL
  Filled 2023-02-02 (×5): qty 2

## 2023-02-02 MED ORDER — VANCOMYCIN HCL 1000 MG IV SOLR
INTRAVENOUS | Status: DC | PRN
  Administered 2023-02-02: 1000 mg

## 2023-02-02 MED ORDER — VITAMIN D 25 MCG (1000 UNIT) PO TABS
2000.0000 [IU] | ORAL_TABLET | Freq: Every day | ORAL | Status: DC
  Administered 2023-02-02 – 2023-02-03 (×2): 2000 [IU] via ORAL
  Filled 2023-02-02 (×2): qty 2

## 2023-02-02 MED ORDER — ONDANSETRON HCL 4 MG/2ML IJ SOLN
INTRAMUSCULAR | Status: AC
  Filled 2023-02-02: qty 2

## 2023-02-02 MED ORDER — DEXAMETHASONE SODIUM PHOSPHATE 10 MG/ML IJ SOLN
INTRAMUSCULAR | Status: AC
  Filled 2023-02-02: qty 1

## 2023-02-02 MED ORDER — HYDROMORPHONE HCL 1 MG/ML IJ SOLN
0.2500 mg | INTRAMUSCULAR | Status: DC | PRN
  Administered 2023-02-02: 0.5 mg via INTRAVENOUS
  Administered 2023-02-02: 0.25 mg via INTRAVENOUS

## 2023-02-02 MED ORDER — OXYCODONE HCL 5 MG PO TABS
ORAL_TABLET | ORAL | Status: AC
  Filled 2023-02-02: qty 1

## 2023-02-02 MED ORDER — FERROUS SULFATE 325 (65 FE) MG PO TABS
325.0000 mg | ORAL_TABLET | Freq: Every day | ORAL | Status: DC
  Administered 2023-02-03: 325 mg via ORAL
  Filled 2023-02-02 (×2): qty 1

## 2023-02-02 MED ORDER — FENTANYL CITRATE (PF) 250 MCG/5ML IJ SOLN
INTRAMUSCULAR | Status: AC
  Filled 2023-02-02: qty 5

## 2023-02-02 MED ORDER — ONDANSETRON HCL 4 MG/2ML IJ SOLN
INTRAMUSCULAR | Status: DC | PRN
  Administered 2023-02-02: 4 mg via INTRAVENOUS

## 2023-02-02 MED ORDER — BUPIVACAINE HCL (PF) 0.25 % IJ SOLN
INTRAMUSCULAR | Status: DC | PRN
  Administered 2023-02-02: 20 mL

## 2023-02-02 MED ORDER — SODIUM CHLORIDE 0.9% FLUSH
3.0000 mL | Freq: Two times a day (BID) | INTRAVENOUS | Status: DC
  Administered 2023-02-02 – 2023-02-03 (×3): 3 mL via INTRAVENOUS

## 2023-02-02 MED ORDER — SODIUM CHLORIDE 1 G PO TABS
1.0000 g | ORAL_TABLET | Freq: Every day | ORAL | Status: DC
  Administered 2023-02-02 – 2023-02-03 (×2): 1 g via ORAL
  Filled 2023-02-02 (×2): qty 1

## 2023-02-02 MED ORDER — BUPROPION HCL ER (XL) 300 MG PO TB24
300.0000 mg | ORAL_TABLET | Freq: Every day | ORAL | Status: DC
  Administered 2023-02-03: 300 mg via ORAL
  Filled 2023-02-02: qty 1

## 2023-02-02 MED ORDER — PROPOFOL 10 MG/ML IV BOLUS
INTRAVENOUS | Status: DC | PRN
  Administered 2023-02-02: 100 mg via INTRAVENOUS

## 2023-02-02 MED ORDER — AMITRIPTYLINE HCL 50 MG PO TABS
100.0000 mg | ORAL_TABLET | Freq: Every day | ORAL | Status: DC
  Administered 2023-02-02: 100 mg via ORAL
  Filled 2023-02-02 (×2): qty 2

## 2023-02-02 MED ORDER — LIDOCAINE 2% (20 MG/ML) 5 ML SYRINGE
INTRAMUSCULAR | Status: DC | PRN
  Administered 2023-02-02: 40 mg via INTRAVENOUS

## 2023-02-02 MED ORDER — LEVOTHYROXINE SODIUM 88 MCG PO TABS
88.0000 ug | ORAL_TABLET | Freq: Every day | ORAL | Status: DC
  Administered 2023-02-03: 88 ug via ORAL
  Filled 2023-02-02: qty 1

## 2023-02-02 MED ORDER — ALLOPURINOL 300 MG PO TABS
300.0000 mg | ORAL_TABLET | Freq: Every day | ORAL | Status: DC
  Administered 2023-02-03: 300 mg via ORAL
  Filled 2023-02-02: qty 1

## 2023-02-02 MED ORDER — METHOCARBAMOL 500 MG PO TABS
500.0000 mg | ORAL_TABLET | Freq: Four times a day (QID) | ORAL | Status: DC | PRN
  Administered 2023-02-02 – 2023-02-03 (×4): 500 mg via ORAL
  Filled 2023-02-02 (×4): qty 1

## 2023-02-02 MED ORDER — CHLORHEXIDINE GLUCONATE CLOTH 2 % EX PADS: 6.0000 | MEDICATED_PAD | Freq: Once | CUTANEOUS | Status: DC

## 2023-02-02 MED ORDER — SODIUM CHLORIDE 0.9% FLUSH: 3.0000 mL | INTRAVENOUS | Status: DC | PRN

## 2023-02-02 MED ORDER — FERROUS SULFATE 325 (65 FE) MG PO TABS: 325.0000 mg | ORAL_TABLET | Freq: Every day | ORAL | Status: DC

## 2023-02-02 MED ORDER — THROMBIN 20000 UNITS EX SOLR
CUTANEOUS | Status: AC
  Filled 2023-02-02: qty 20000

## 2023-02-02 MED ORDER — HYDROMORPHONE HCL 1 MG/ML IJ SOLN
INTRAMUSCULAR | Status: AC
  Filled 2023-02-02: qty 0.5

## 2023-02-02 MED ORDER — ONDANSETRON HCL 4 MG PO TABS: 4.0000 mg | ORAL_TABLET | Freq: Four times a day (QID) | ORAL | Status: DC | PRN

## 2023-02-02 MED ORDER — LACTATED RINGERS IV SOLN: INTRAVENOUS | Status: DC

## 2023-02-02 MED ORDER — HYDROMORPHONE HCL 1 MG/ML IJ SOLN
1.0000 mg | INTRAMUSCULAR | Status: DC | PRN
  Administered 2023-02-02 (×2): 1 mg via INTRAVENOUS
  Filled 2023-02-02 (×2): qty 1

## 2023-02-02 MED ORDER — CEFAZOLIN SODIUM-DEXTROSE 1-4 GM/50ML-% IV SOLN
1.0000 g | Freq: Three times a day (TID) | INTRAVENOUS | Status: AC
  Administered 2023-02-02 (×2): 1 g via INTRAVENOUS
  Filled 2023-02-02 (×2): qty 50

## 2023-02-02 SURGICAL SUPPLY — 65 items
ADH SKN CLS APL DERMABOND .7 (GAUZE/BANDAGES/DRESSINGS) ×1
APL SKNCLS STERI-STRIP NONHPOA (GAUZE/BANDAGES/DRESSINGS) ×1
BAG COUNTER SPONGE SURGICOUNT (BAG) ×1 IMPLANT
BAG DECANTER FOR FLEXI CONT (MISCELLANEOUS) ×1 IMPLANT
BAG SPNG CNTER NS LX DISP (BAG) ×1
BENZOIN TINCTURE PRP APPL 2/3 (GAUZE/BANDAGES/DRESSINGS) ×1 IMPLANT
BLADE CLIPPER SURG (BLADE) IMPLANT
BUR CUTTER 7.0 ROUND (BURR) IMPLANT
BUR MATCHSTICK NEURO 3.0 LAGG (BURR) ×1 IMPLANT
CANISTER SUCT 3000ML PPV (MISCELLANEOUS) ×1 IMPLANT
CNTNR URN SCR LID CUP LEK RST (MISCELLANEOUS) ×1 IMPLANT
CONNECTOR LAT CLSD 5.5X90 (Connector) IMPLANT
CONT SPEC 4OZ STRL OR WHT (MISCELLANEOUS) ×1
COVER BACK TABLE 60X90IN (DRAPES) ×1 IMPLANT
DERMABOND ADVANCED .7 DNX12 (GAUZE/BANDAGES/DRESSINGS) ×1 IMPLANT
DRAPE C-ARM 42X72 X-RAY (DRAPES) ×2 IMPLANT
DRAPE HALF SHEET 40X57 (DRAPES) IMPLANT
DRAPE LAPAROTOMY 100X72X124 (DRAPES) ×1 IMPLANT
DRAPE SURG 17X23 STRL (DRAPES) ×4 IMPLANT
DRSG OPSITE POSTOP 4X6 (GAUZE/BANDAGES/DRESSINGS) IMPLANT
DRSG OPSITE POSTOP 4X8 (GAUZE/BANDAGES/DRESSINGS) IMPLANT
DURAPREP 26ML APPLICATOR (WOUND CARE) ×1 IMPLANT
ELECT REM PT RETURN 9FT ADLT (ELECTROSURGICAL) ×1
ELECTRODE REM PT RTRN 9FT ADLT (ELECTROSURGICAL) ×1 IMPLANT
EVACUATOR 1/8 PVC DRAIN (DRAIN) ×1 IMPLANT
GAUZE 4X4 16PLY ~~LOC~~+RFID DBL (SPONGE) IMPLANT
GAUZE SPONGE 4X4 12PLY STRL (GAUZE/BANDAGES/DRESSINGS) ×1 IMPLANT
GLOVE ECLIPSE 9.0 STRL (GLOVE) ×2 IMPLANT
GLOVE EXAM NITRILE XL STR (GLOVE) IMPLANT
GOWN STRL REUS W/ TWL LRG LVL3 (GOWN DISPOSABLE) ×1 IMPLANT
GOWN STRL REUS W/ TWL XL LVL3 (GOWN DISPOSABLE) ×2 IMPLANT
GOWN STRL REUS W/TWL 2XL LVL3 (GOWN DISPOSABLE) IMPLANT
GOWN STRL REUS W/TWL LRG LVL3 (GOWN DISPOSABLE) ×1
GOWN STRL REUS W/TWL XL LVL3 (GOWN DISPOSABLE) ×2
GRAFT BN 10X1XDBM MAGNIFUSE (Bone Implant) IMPLANT
GRAFT BN 5X1XSPNE CVD POST DBM (Bone Implant) IMPLANT
GRAFT BONE MAGNIFUSE 1X10CM (Bone Implant) ×1 IMPLANT
GRAFT BONE MAGNIFUSE 1X5CM (Bone Implant) ×2 IMPLANT
KIT BASIN OR (CUSTOM PROCEDURE TRAY) ×1 IMPLANT
KIT INFUSE SMALL (Orthopedic Implant) IMPLANT
KIT TURNOVER KIT B (KITS) ×1 IMPLANT
NEEDLE HYPO 22GX1.5 SAFETY (NEEDLE) ×1 IMPLANT
NS IRRIG 1000ML POUR BTL (IV SOLUTION) ×1 IMPLANT
PACK LAMINECTOMY NEURO (CUSTOM PROCEDURE TRAY) ×1 IMPLANT
RASP 3.0MM (RASP) IMPLANT
ROD Z OFFSET 5.5X11.4 (Rod) IMPLANT
SCREW CANN SOLERA 5X45 (Screw) IMPLANT
SCREW CONNECTOR SET (Screw) IMPLANT
SCREW SET SOLERA (Screw) ×6 IMPLANT
SCREW SET SOLERA TI5.5 (Screw) IMPLANT
SCREW SOLERA 45X5.5XMA NS SPNE (Screw) IMPLANT
SCREW SOLERA 5.5X45MM (Screw) ×3 IMPLANT
SOL ELECTROSURG ANTI STICK (MISCELLANEOUS)
SOLUTION ELECTROSURG ANTI STCK (MISCELLANEOUS) ×1 IMPLANT
SPIKE FLUID TRANSFER (MISCELLANEOUS) ×1 IMPLANT
SPONGE SURGIFOAM ABS GEL 100 (HEMOSTASIS) ×1 IMPLANT
STRIP CLOSURE SKIN 1/2X4 (GAUZE/BANDAGES/DRESSINGS) ×2 IMPLANT
SUT VIC AB 0 CT1 18XCR BRD8 (SUTURE) ×2 IMPLANT
SUT VIC AB 0 CT1 8-18 (SUTURE) ×1
SUT VIC AB 2-0 CT1 18 (SUTURE) ×1 IMPLANT
SUT VIC AB 3-0 SH 8-18 (SUTURE) ×2 IMPLANT
TOWEL GREEN STERILE (TOWEL DISPOSABLE) ×1 IMPLANT
TOWEL GREEN STERILE FF (TOWEL DISPOSABLE) ×1 IMPLANT
TRAY FOLEY MTR SLVR 16FR STAT (SET/KITS/TRAYS/PACK) ×1 IMPLANT
WATER STERILE IRR 1000ML POUR (IV SOLUTION) ×1 IMPLANT

## 2023-02-02 NOTE — Op Note (Signed)
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  Postoperative diagnosis: Same  Procedure Name: Posterior lateral fusion T6-7 T7-8 T8-9 with morselized allograft, local autograft, and bone morphogenic protein.  Segmental pedicle screw fixation T6-T9 connecting to existing thoracic fusion.  Surgeon:Clerence Gubser A.Skylynne Schlechter, M.D.  Asst. Surgeon: Doran Durand, NP  Anesthesia: General  Indication: 77 year old female status post prior T9 to pelvis fusion with increasing thoracic pain failing all conservative management.  Workup demonstrates evidence of severe adjacent level degeneration at T8-9 and T7-8 and to a lesser degree at T6-7 with some instability and associated spondylosis but no severe stenosis.  Patient has failed conservative management presents now for extension of her fusion to T6.  Operative note: After induction of anesthesia, patient positioned prone onto bolsters and appropriately padded.  Patient's thoracic region prepped and draped sterilely.  Incision made from T6 down to T10.  Dissection performed bilaterally.  Previously placed pedicle screws potation at T9 and T10 was dissected free as was the intervening rod.  Soft tissue was removed from around the rod between T9 and T10 for placement of a lateral connector.  Fluoroscopy was then utilized to identify the pedicles at T6-T7 and T8 bilaterally.  This was confirmed with surface landmarks as well.  Pilot holes were drilled.  Using fluoroscopy in the lateral plane then each pilot hole was then tapped with a pedicle awl.  Each pedicle tract was then probed and found to be solidly within the bone.  Each pedicle tract was then tapped with a screw tap.  Each screw tap hole was probed and found to be solidly within the bone.  5.0 [employer]tr[employer] screws were placed on the  left side at T6, T7 and T8 and 5.5 millimeter screws were placed on the right side were placed.  Images revealed good position of the pedicle screw and rotation at the proper levels with no evidence of any complicating features.  A segment of titanium rod was then cut and contoured to fit into the screw heads and the lateral connector which was placed between the T9 and T10 screw heads.  Locking caps were then placed over the screw caps.  Final tightening was then performed including final tightening of the transverse connector.  All connections were solid.  The transverse processes, lamina and facet joints were then aggressively decorticated using high-speed drill.  Local autograft was harvested and reused.  A small BMP was then placed over the decorticated bone.  Local autograft was then packed over the BMP as were allograft packets.  Vancomycin powder was placed in the deep wound space.  Wound is then closed in layers with Vicryl sutures.  Steri-Strips and sterile dressing were applied.  No apparent complications.  Patient tolerated the procedure well and she returns to the recovery room postop.

## 2023-02-02 NOTE — Anesthesia Postprocedure Evaluation (Signed)
Anesthesia Post Note  Patient: [name] [name]  Procedure(s) Performed: Thoracic six - Thoracic nine Posterior lateral fusion (Back)     Patient location during evaluation: PACU Anesthesia Type: General Level of consciousness: awake and alert, patient cooperative and oriented Pain management: pain level controlled Vital Signs Assessment: post-procedure vital signs reviewed and stable Respiratory status: spontaneous breathing, nonlabored ventilation and respiratory function stable Cardiovascular status: blood pressure returned to baseline and stable Postop Assessment: no apparent nausea or vomiting Anesthetic complications: no   No notable events documented.  Last Vitals:  Vitals:   02/02/23 1145 02/02/23 1205  BP: 139/69 (!) 140/66  Pulse: 84 82  Resp: 11 16  Temp: 36.4 C 36.8 C  SpO2: 95% 96%    Last Pain:  Vitals:   02/02/23 1205  TempSrc: Oral  PainSc:                  Puja Caffey,E. Keyli Duross

## 2023-02-02 NOTE — Transfer of Care (Signed)
Immediate Anesthesia Transfer of Care Note  Patient: [name] [name]  Procedure(s) Performed: Thoracic six - Thoracic nine Posterior lateral fusion (Back)  Patient Location: PACU  Anesthesia Type:General  Level of Consciousness: awake and sedated  Airway & Oxygen Therapy: Patient Spontanous Breathing and Patient connected to face mask oxygen  Post-op Assessment: Report given to RN and Post -op Vital signs reviewed and stable  Post vital signs: Reviewed and stable  Last Vitals:  Vitals Value Taken Time  BP 122/54 02/02/23 1040  Temp    Pulse 84 02/02/23 1043  Resp 15 02/02/23 1043  SpO2 99 % 02/02/23 1043  Vitals shown include unvalidated device data.  Last Pain:  Vitals:   02/02/23 0643  TempSrc:   PainSc: 9       Patients Stated Pain Goal: 4 (02/02/23 6578)  Complications: No notable events documented.

## 2023-02-02 NOTE — H&P (Signed)
[name] [name] is an 77 y.o. female.   Chief Complaint: Back pain HPI: 77 year old female status post T9 to pelvis decompression and fusion surgery presents now with worsening back pain.  Workup demonstrates evidence of progressive disc degeneration and spondylosis at T6-7 T7-8 and T8-9.  Patient has failed conservative management and presents now for extension of her fusion from T9 up to T6.  Risks and benefits been explained.  Patient wishes to proceed.  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   DDD (degenerative disc disease)    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 ear    Depression    Diverticulosis    Elevated LFTs    Fibromyalgia 1978   GERD (gastroesophageal reflux disease)    Hematuria    History of syncope    episode 2008--  no recurrence since   HLD (hyperlipidemia)    Hypothyroidism    Kidney stones 2014   Plantar fasciitis    Ruptured disk    one in neck and two in back   S/P chemotherapy, time since greater than 12 weeks    Sinus tachycardia    mild resting   Skin cancer    nose   Stool incontinence    "at times recently"     Past Surgical History:  Procedure Laterality Date   ANTERIOR LATERAL LUMBAR FUSION 4 LEVELS Left 04/25/2016   Procedure: LEFT LUMBAR ONE-TWO, LUMBAR TWO-THREE, LUMBAR THREE-FOUR, LUMBAR FOUR-FIVE ANTERIOR LATERAL LUMBAR FUSION;  Surgeon: Julio Sicks, MD;  Location: [facility];  Service: Neurosurgery;  Laterality: Left;   APPENDECTOMY  1997   APPLICATION OF ROBOTIC ASSISTANCE FOR SPINAL PROCEDURE N/A 04/06/2017   Procedure: APPLICATION OF ROBOTIC ASSISTANCE FOR SPINAL PROCEDURE;  Surgeon: Julio Sicks, MD;  Location: [facility] OR;  Service: Neurosurgery;  Laterality: N/A;    BREAST BIOPSY Right 2014   BREAST LUMPECTOMY Right 1997   BREAST LUMPECTOMY WITH AXILLARY LYMPH NODE DISSECTION Right 1997   CATARACT EXTRACTION, BILATERAL  2019   Dr. Alben Spittle   CYSTOSCOPY WITH RETROGRADE PYELOGRAM, URETEROSCOPY AND STENT PLACEMENT Bilateral 06/17/2013   Procedure: CYSTOSCOPY WITH RETROGRADE PYELOGRAM, URETEROSCOPY AND LEFT DOUBLE  J STENT PLACEMENT RIGHT URETERAL HOLMIIUM LASER AND DOUBLE J STENT ;  Surgeon: Lindaann Slough, MD;  Location: [facility];  Service: Urology;  Laterality: Bilateral;   HOLMIUM LASER APPLICATION Bilateral 06/17/2013   Procedure: HOLMIUM LASER APPLICATION;  Surgeon: Lindaann Slough, MD;  Location: [facility];  Service: Urology;  Laterality: Bilateral;   LITHOTRIPSY Left 06/2013   LUMBAR EPIDURAL INJECTION     has had 7 injections   PORT-A-CATH REMOVAL Left 10/03/2014   Procedure: REMOVAL PORT-A-CATH;  Surgeon: Manus Rudd, MD;  Location: [facility];  Service: General;  Laterality: Left;   PORTACATH PLACEMENT Left 07/28/2013   Procedure: ATTEMPTED INSERTION PORT-A-CATH;  Surgeon: Wilmon Arms. Corliss Skains, MD;  Location: [facility] OR;  Service: General;  Laterality: Left;   POSTERIOR LUMBAR FUSION 4 LEVEL N/A 04/25/2016   Procedure: LUMBAR FIVE-SACRAL ONE POSTERIOR LUMBAR INTERBODY FUSION, THORACIC NINE-SACRAL ONE POSTERIOR LATERAL ARTHRODESIS WITH PEDICLE SCREWS;  Surgeon: Julio Sicks, MD;  Location: [facility];  Service: Neurosurgery;  Laterality: N/A;   RETINAL DETACHMENT SURGERY Right 2013   ROBOTIC ASSITED PARTIAL NEPHRECTOMY Right 02/05/2015   Procedure: ROBOTIC ASSITED PARTIAL NEPHRECTOMY;  Surgeon: Heloise Purpura, MD;  Location: [facility];  Service: Urology;  Laterality: Right;   SIMPLE MASTECTOMY WITH AXILLARY SENTINEL NODE BIOPSY Right 07/28/2013   Procedure: RIGHT TOTAL  MASTECTOMY;  Surgeon: Wilmon Arms. Corliss Skains, MD;  Location: [facility] OR;  Service: General;  Laterality: Right;   TONSILLECTOMY  AGE 23   TOTAL ABDOMINAL  HYSTERECTOMY W/ BILATERAL SALPINGOOPHORECTOMY  1997    Family History  Problem Relation Age of Onset   Heart disease Maternal Grandfather    Diabetes Maternal Grandfather    Colon cancer Maternal Aunt 28   Brain cancer Paternal Grandmother        dx in 26s   Dementia Mother    Diabetes Mother    Osteoporosis Mother    Diabetes Maternal Aunt    Prostate cancer Father 65   Bipolar disorder Maternal Aunt    Stroke Maternal Aunt    Stomach cancer Paternal Uncle        dx in late 33s   Social History:  reports that she has never smoked. She has never used smokeless tobacco. She reports that she does not drink alcohol and does not use drugs.  Allergies:  Allergies  Allergen Reactions   Furosemide Nausea And Vomiting, Other (See Comments) and Itching    HEADACHE  Other Reaction(s): GI Intolerance, Other (See Comments)   Lithium Other (See Comments)    Dizziness, "cause me to fall"   Sulfa Antibiotics Hives   Trazodone And Nefazodone Other (See Comments)    insomnia   Lyrica [Pregabalin] Diarrhea, Nausea Only and Rash    Medications Prior to Admission  Medication Sig Dispense Refill   acalabrutinib maleate (CALQUENCE) 100 MG tablet Take 1 tablet (100 mg total) by mouth 2 (two) times daily. 60 tablet 2   allopurinol (ZYLOPRIM) 300 MG tablet TAKE 1 TABLET(300 MG) BY MOUTH DAILY (Patient taking differently: Take 300 mg by mouth daily.) 90 tablet 4   amitriptyline (ELAVIL) 100 MG tablet Take 100 mg by mouth at bedtime.     aspirin 81 MG EC tablet Take 81 mg by mouth daily.     brexpiprazole (REXULTI) 1 MG TABS tablet Take 1 mg by mouth daily.     buPROPion (WELLBUTRIN XL) 300 MG 24 hr tablet Take 300 mg by mouth daily.     Cholecalciferol (VITAMIN D3) 50 MCG (2000 UT) TABS Take 2,000 Units by mouth daily.     COLLAGEN PO Take 4 tablets by mouth daily. Vital protein gummy     Cyanocobalamin (VITAMIN B12) 1000 MCG TBCR Take 1,000 Units by mouth daily.     ferrous sulfate 325 (65 FE)  MG tablet Take 325 mg by mouth daily with breakfast.     fluvoxaMINE (LUVOX) 50 MG tablet Take 100 mg by mouth at bedtime.     gabapentin (NEURONTIN) 100 MG capsule Take 200 mg by mouth 2 (two) times daily.     ibuprofen (ADVIL) 200 MG tablet Take 600 mg by mouth in the morning.     levothyroxine (SYNTHROID, LEVOTHROID) 88 MCG tablet Take 88 mcg by mouth daily before breakfast.  4   Multiple Vitamins-Minerals (WOMENS 50+ MULTI VITAMIN/MIN) TABS Take 1 tablet by mouth daily.     omeprazole (PRILOSEC) 40 MG capsule TAKE 1 CAPSULE(40 MG) BY MOUTH DAILY AFTER BREAKFAST (Patient taking differently: Take 40 mg by mouth daily.  TAKE 1 CAPSULE(40 MG) BY MOUTH DAILY AFTER BREAKFAST) 90 capsule 0   rosuvastatin (CRESTOR) 5 MG tablet Take 1 tablet (5 mg total) by mouth daily. 30 tablet 8   sodium chloride 1 g tablet Take 1 tablet (1 g total) by mouth 2 (two) times daily with a meal. (Patient taking differently: Take 1 g by mouth daily.) 60 tablet 2   zinc gluconate 50 MG tablet Take 50 mg by mouth daily.     diclofenac Sodium (VOLTAREN) 1 % GEL Apply 2 g topically 4 (four) times daily. (Patient not taking: Reported on 01/20/2023) 2 g 3    No results found. However, due to the size of the patient record, not all encounters were searched. Please check Results Review for a complete set of results. No results found.  Pertinent items noted in HPI and remainder of comprehensive ROS otherwise negative.  Blood pressure 133/72, pulse 83, temperature 98.6 F (37 C), temperature source Oral, resp. rate 16, height 5\' 3"  (1.6 m), weight 77.1 kg, last menstrual period 08/26/1995, SpO2 100 %.  Patient is awake and alert.  She is oriented and appropriate.  Speech is fluent.  Judgment insight are intact.  Cranial nerve function normal bilateral.  Motor examination reveals intact motor strength bilaterally sensory examination with decrease sensation.  Light touch.  Deep tendon rhexis normal active.  Gait and posture are  reasonably normal peer examination head ears eyes nose throat is unremarked.  Chest and abdomen benign. Assessment/Plan Status post T9-S1 decompression and fusion surgery with adjacent level degeneration and intractable mechanical back pain.  Plan extension of fusion to T6 with segmental pedicle screw fixation.  Risks and benefits been explained.  Patient wishes to proceed.  Sherilyn Cooter A Vernestine Brodhead 02/02/2023, 7:59 AM

## 2023-02-02 NOTE — Progress Notes (Signed)
Orthopedic Tech Progress Note Patient Details:  [name] [name] 1946/06/01 [id number]  RN stated patient has back brace Patient ID: [name] [name], female   DOB: [birth year], 77 y.o.   MRN: [id number]  [name] [name] 02/02/2023, 3:58 PM

## 2023-02-02 NOTE — Anesthesia Procedure Notes (Signed)

## 2023-02-02 NOTE — Brief Op Note (Signed)
02/02/2023  10:29 AM  PATIENT:  [name] [name]  77 y.o. female  PRE-OPERATIVE DIAGNOSIS:  Stenosis  POST-OPERATIVE DIAGNOSIS:  Stenosis  PROCEDURE:  Procedure(s): Thoracic six - Thoracic nine Posterior lateral fusion (N/A)  SURGEON:  Surgeon(s) and Role:    Julio Sicks, MD - Primary  PHYSICIAN ASSISTANT:   ASSISTANTSMarland Mcalpine   ANESTHESIA:   general  EBL:  100cc   BLOOD ADMINISTERED:none  DRAINS: none   LOCAL MEDICATIONS USED:  MARCAINE     SPECIMEN:  No Specimen  DISPOSITION OF SPECIMEN:  N/A  COUNTS:  YES  TOURNIQUET:  * No tourniquets in log *  DICTATION: .Dragon Dictation  PLAN OF CARE: Admit to inpatient   PATIENT DISPOSITION:  PACU - hemodynamically stable.   Delay start of Pharmacological VTE agent (>24hrs) due to surgical blood loss or risk of bleeding: yes

## 2023-02-03 MED ORDER — CYCLOBENZAPRINE HCL 10 MG PO TABS: 10.0000 mg | ORAL_TABLET | Freq: Three times a day (TID) | ORAL | 0 refills | Status: DC | PRN

## 2023-02-03 MED ORDER — OXYCODONE HCL 10 MG PO TABS: 10.0000 mg | ORAL_TABLET | ORAL | 0 refills | Status: DC | PRN

## 2023-02-03 NOTE — Discharge Summary (Signed)
Physician Discharge Summary  Patient ID: [name] [name] MRN: [id number] DOB/AGE: 77-May-1947 77 y.o.  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  Postoperative doing reasonably well.  Expected amount of back pain.  No radicular pain.  No new lower extremity symptoms.  Mobilizing reasonably well.  Patient feels comfortable with discharge home.  Consults:   Significant Diagnostic Studies:   Treatments:   Discharge Exam: Blood pressure 108/65, pulse 96, temperature 98.3 F (36.8 C), temperature source Oral, resp. rate 18, height 5\' 3"  (1.6 m), weight 77.1 kg, last menstrual period 08/26/1995, SpO2 94 %. Awake and alert.  Oriented and appropriate.  Motor and sensory function intact.  Wound clean and dry.  Chest and abdomen benign.  Disposition: Discharge disposition: 01-Home or Self Care        Allergies as of 02/03/2023       Reactions   Furosemide Nausea And Vomiting, Other (See Comments), Itching   HEADACHE Other Reaction(s): GI Intolerance, Other (See Comments)   Lithium Other (See Comments)   Dizziness, "cause me to fall"   Sulfa Antibiotics Hives   Trazodone And Nefazodone Other (See Comments)   insomnia   Lyrica [pregabalin] Diarrhea, Nausea Only, Rash        Medication List     TAKE these medications    allopurinol 300 MG tablet Commonly known as: ZYLOPRIM TAKE 1 TABLET(300 MG) BY MOUTH DAILY What changed: See the new instructions.   amitriptyline 100 MG tablet Commonly known as: ELAVIL Take 100 mg by mouth at bedtime.   aspirin EC 81 MG tablet Take 81 mg by mouth daily.   brexpiprazole 1 MG Tabs tablet Commonly known as: REXULTI Take 1 mg by mouth  daily.   buPROPion 300 MG 24 hr tablet Commonly known as: WELLBUTRIN XL Take 300 mg by mouth daily.   Calquence 100 MG tablet Generic drug: acalabrutinib maleate Take 1 tablet (100 mg total) by mouth 2 (two) times daily.   COLLAGEN PO Take 4 tablets by mouth daily. Vital protein gummy   cyclobenzaprine 10 MG tablet Commonly known as: FLEXERIL Take 1 tablet (10 mg total) by mouth 3 (three) times daily as needed for muscle spasms.   diclofenac Sodium 1 % Gel Commonly known as: Voltaren Apply 2 g topically 4 (four) times daily.   ferrous sulfate 325 (65 FE) MG tablet Take 325 mg by mouth daily with breakfast.   fluvoxaMINE 50 MG tablet Commonly known as: LUVOX Take 100 mg by mouth at bedtime.   gabapentin 100 MG capsule Commonly known as: NEURONTIN Take 200 mg by mouth 2 (two) times daily.   ibuprofen 200 MG tablet Commonly known as: ADVIL Take 600 mg by mouth in the morning.   levothyroxine 88 MCG tablet Commonly known as: SYNTHROID Take 88 mcg by mouth daily before breakfast.   omeprazole 40 MG capsule Commonly known as: PRILOSEC TAKE 1 CAPSULE(40 MG) BY MOUTH DAILY AFTER BREAKFAST What changed: See the new instructions.   Oxycodone HCl 10 MG Tabs Take 1 tablet (10 mg total) by mouth every 3 (three) hours as needed for severe pain ((score 7 to 10)).   rosuvastatin 5 MG tablet Commonly known as: CRESTOR Take 1 tablet (5 mg total) by mouth daily.  sodium chloride 1 g tablet Take 1 tablet (1 g total) by mouth 2 (two) times daily with a meal. What changed: when to take this   Vitamin B12 1000 MCG Tbcr Take 1,000 Units by mouth daily.   Vitamin D3 50 MCG (2000 UT) Tabs Take 2,000 Units by mouth daily.   Womens 50+ Multi Vitamin/Min Tabs Take 1 tablet by mouth daily.   zinc gluconate 50 MG tablet Take 50 mg by mouth daily.               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

## 2023-02-03 NOTE — Progress Notes (Signed)
Patient alert and oriented, voiding adequately, skin clean, dry and intact without evidence of skin break down, or symptoms of complications - no redness or edema noted, only slight tenderness at site.  Patient states pain is manageable at time of discharge. Patient has an appointment with MD in 2 weeks 

## 2023-02-03 NOTE — Evaluation (Signed)
Occupational Therapy Evaluation Patient Details Name: [name] [name] MRN: [id number] DOB: 09-Jan-1946 Today's Date: 02/03/2023   History of Present Illness Pt is a 77 y.o. female who presented 02/02/23 for elective T6-T9 posterior lateral fusion. PMH: alopecia, anxiety, arthritis, CLL, depression, deafness in R ear, breast cancer, fibromyalgia, HLD, hx of syncope, hypothyroidism, lumbar fusion 4 levels   Clinical Impression   Patient admitted for above and presents with problem list below.  PTA she was independent with ADLs, but does reports several falls recentlyPatient was educated on brace mgmt and wear schedule, back precautions, ADL compensatory techniques, AE/DME, mobility progression, safety and recommendations.  Today, pt demonstrated ability to complete transfers using min guard to supervision with RW, functional mobility using RW with min guard to close supervision, and ADLs with up to min assist for LB dressing/bathing.  Pt educated on use of reacher for LB dressing (as she has one at home) and educated on safety for bathing in shower.   Pt reports spouse will be available and can assist as needed.   She did require intermittent cueing functionally to adhere to precautions, but overall doing well.  No overt signs or symptoms of low BP, but also not assessed this session.  Will follow acutely to optimize independence, safety and return to PLOF.        Recommendations for follow up therapy are one component of a multi-disciplinary discharge planning process, led by the attending physician.  Recommendations may be updated based on patient status, additional functional criteria and insurance authorization.   Assistance Recommended at Discharge Frequent or constant Supervision/Assistance (initally)  Patient can return home with the following A little help with walking and/or transfers;A little help with bathing/dressing/bathroom;Assistance with cooking/housework;Direct supervision/assist for  medications management;Direct supervision/assist for financial management;Assist for transportation;Help with stairs or ramp for entrance    Functional Status Assessment  Patient has had a recent decline in their functional status and demonstrates the ability to make significant improvements in function in a reasonable and predictable amount of time.  Equipment Recommendations  None recommended by OT    Recommendations for Other Services       Precautions / Restrictions Precautions Precautions: Back Precaution Booklet Issued: Yes (comment) Precaution Comments: watch BP (orthostatic 6/11); reviewed precautions Required Braces or Orthoses: Spinal Brace Spinal Brace: Applied in sitting position;Lumbar corset Restrictions Weight Bearing Restrictions: No      Mobility Bed Mobility               General bed mobility comments: OOB in recliner    Transfers Overall transfer level: Needs assistance Equipment used: Rolling walker (2 wheels) Transfers: Sit to/from Stand Sit to Stand: Min guard, Supervision           General transfer comment: cueing for hand placement and safety, min guard to supervision for safety      Balance Overall balance assessment: Needs assistance Sitting-balance support: No upper extremity supported, Feet supported Sitting balance-Leahy Scale: Good     Standing balance support: No upper extremity supported, Bilateral upper extremity supported Standing balance-Leahy Scale: Fair Standing balance comment: Able to stand at sink without UE support, reliant on RW to ambulate                           ADL either performed or assessed with clinical judgement   ADL Overall ADL's : Needs assistance/impaired     Grooming: Min guard;Standing  Upper Body Dressing : Set up;Sitting Upper Body Dressing Details (indicate cue type and reason): cueing for brace mgmt Lower Body Dressing: Minimal assistance;Sit to/from stand;Cueing  for back precautions Lower Body Dressing Details (indicate cue type and reason): assist to thread legs into pants but educated on use of reacher for increased independence, pt reports spouse can assist as well Toilet Transfer: Min guard;Ambulation;Rolling walker (2 wheels) Toilet Transfer Details (indicate cue type and reason): simulated in room         Functional mobility during ADLs: Min guard;Rolling walker (2 wheels);Cueing for safety General ADL Comments: cueing to adhere to spinal precautions, but overall moving and completing ADLs well.  BP not assessed during session but no overt s/s of hypotension     Vision   Vision Assessment?: No apparent visual deficits     Perception     Praxis      Pertinent Vitals/Pain Pain Assessment Pain Assessment: 0-10 Pain Score: 8  Pain Location: back Pain Descriptors / Indicators: Discomfort, Grimacing, Operative site guarding Pain Intervention(s): Limited activity within patient's tolerance, Monitored during session, Repositioned     Hand Dominance Right   Extremity/Trunk Assessment Upper Extremity Assessment Upper Extremity Assessment: Generalized weakness   Lower Extremity Assessment Lower Extremity Assessment: Defer to PT evaluation   Cervical / Trunk Assessment Cervical / Trunk Assessment: Back Surgery   Communication Communication Communication: No difficulties   Cognition Arousal/Alertness: Awake/alert Behavior During Therapy: WFL for tasks assessed/performed Overall Cognitive Status: No family/caregiver present to determine baseline cognitive functioning Area of Impairment: Problem solving, Memory, Awareness, Safety/judgement                     Memory: Decreased recall of precautions   Safety/Judgement: Decreased awareness of safety, Decreased awareness of deficits Awareness: Emergent Problem Solving: Slow processing, Requires verbal cues General Comments: she follows simple commands and recalls back  precautions but requires cueing to adhere functionally.  She demonstrates some decreased awareness and mild confusion at times- voicing "did i flush the toilet" when she didn't even go to the bathroom     General Comments  BP: 135/67 sitting EOB with reports of lightheadedness, 95/46 standing with reports of lightheadedness, 108/65 sitting in chair end of session; encouraged frequent positional changes and mobility    Exercises     Shoulder Instructions      Home Living Family/patient expects to be discharged to:: Private residence Living Arrangements: Spouse/significant other Available Help at Discharge: Family;Available PRN/intermittently (husband farms but can be around often throughout day, daughter plans to come assist 24/7 initially) Type of Home: House Home Access: Stairs to enter [employer] of Steps: 2 Entrance Stairs-Rails: Can reach both Home Layout: One level;Other (Comment) (basement she does not need to access)     Bathroom Shower/Tub: Producer, television/film/video: Standard     Home Equipment: Agricultural consultant (2 wheels);Cane - single point;Shower seat;BSC/3in1;Adaptive equipment Adaptive Equipment: Reacher        Prior Functioning/Environment Prior Level of Function : Independent/Modified Independent;Driving;History of Falls (last six months)             Mobility Comments: No AD ADLs Comments: independnet        OT Problem List: Decreased strength;Decreased activity tolerance;Impaired balance (sitting and/or standing);Pain;Decreased knowledge of precautions;Decreased knowledge of use of DME or AE;Decreased safety awareness;Decreased cognition      OT Treatment/Interventions: Self-care/ADL training;Therapeutic exercise;DME and/or AE instruction;Therapeutic activities;Cognitive remediation/compensation;Patient/family education;Balance training;Energy conservation    OT Goals(Current goals can be  found in the care plan section) Acute Rehab OT  Goals Patient Stated Goal: home OT Goal Formulation: With patient Time For Goal Achievement: 02/17/23 Potential to Achieve Goals: Good  OT Frequency: Min 2X/week    Co-evaluation              AM-PAC OT "6 Clicks" Daily Activity     Outcome Measure Help from another person eating meals?: None Help from another person taking care of personal grooming?: A Little Help from another person toileting, which includes using toliet, bedpan, or urinal?: A Little Help from another person bathing (including washing, rinsing, drying)?: A Little Help from another person to put on and taking off regular upper body clothing?: A Little Help from another person to put on and taking off regular lower body clothing?: A Little 6 Click Score: 19   End of Session Equipment Utilized During Treatment: Rolling walker (2 wheels);Back brace Nurse Communication: Mobility status  Activity Tolerance: Patient tolerated treatment well Patient left: in chair;with call bell/phone within reach  OT Visit Diagnosis: Other abnormalities of gait and mobility (R26.89);Muscle weakness (generalized) (M62.81);Pain;History of falling (Z91.81) Pain - part of body:  (back)                Time: 1610-9604 OT Time Calculation (min): 22 min Charges:  OT General Charges $OT Visit: 1 Visit OT Evaluation $OT Eval Moderate Complexity: 1 Mod  Barry Brunner, OT Acute Rehabilitation Services Office [phone]   Chancy Milroy 02/03/2023, 9:22 AM

## 2023-02-03 NOTE — Discharge Instructions (Addendum)
Wound Care Keep incision covered and dry for three days. Do not put any creams, lotions, or ointments on incision. Leave steri-strips on back.  They will fall off by themselves. Activity Walk each and every day, increasing distance each day. No lifting greater than 8 lbs.  Avoid excessive neck motion. No driving for 2 weeks; may ride as a passenger locally. If provided with back brace, wear when out of bed.  It is not necessary to wear brace in bed. Diet Resume your normal diet.   Call Your Doctor If Any of These Occur Redness, drainage, or swelling at the wound.  Temperature greater than 101 degrees. Severe pain not relieved by pain medication. Incision starts to come apart. Follow Up Appt Call  [phone])  for problems.  If you have any hardware placed in your spine, you will need an x-ray before your appointment.  

## 2023-02-03 NOTE — Plan of Care (Signed)

## 2023-02-03 NOTE — Evaluation (Signed)
Physical Therapy Evaluation Patient Details Name: [name] [name] MRN: [id number] DOB: Dec 24, 1945 Today's Date: 02/03/2023  History of Present Illness  Pt is a 77 y.o. female who presented 02/02/23 for elective T6-T9 posterior lateral fusion. 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 PTA, she was independent without AD, living with her husband in a 1-level house with 2 STE. Her daughter plans to come stay and assist her initially upon d/c as her husband has to leave intermittently throughout the day to work on the farm. Currently, pt is limited primarily by her pain and symptomatic orthostatic hypotension, see below. She is demonstrating [facility] and symmetrical bil lower extremity strength and sensation with a hx of peripheral neuropathy bil. She was able to perform all functional mobility at a min guard assist level while utilizing a RW for support. She did display some instability with positional changes, likely due to her BP dropping though. Pt will likely progress well as her BP becomes more stable and her pain improves and will likely not need PT follow-up at d/c. Will continue to follow acutely.    BP:  135/67 sitting EOB with reports of lightheadedness 95/46 standing with reports of lightheadedness 108/65 sitting in chair end of session      Recommendations for follow up therapy are one component of a multi-disciplinary discharge planning process, led by the attending physician.  Recommendations may be updated based on patient status, additional functional criteria and insurance authorization.  Follow Up Recommendations       Assistance Recommended at Discharge Intermittent Supervision/Assistance  Patient can return home with the following  A little help with walking  and/or transfers;A little help with bathing/dressing/bathroom;Assistance with cooking/housework;Assist for transportation;Help with stairs or ramp for entrance    Equipment Recommendations None recommended by PT  Recommendations for Other Services       Functional Status Assessment Patient has had a recent decline in their functional status and demonstrates the ability to make significant improvements in function in a reasonable and predictable amount of time.     Precautions / Restrictions Precautions Precautions: Back Precaution Booklet Issued: Yes (comment) Precaution Comments: watch BP (orthostatic 6/11); reviewed precautions Required Braces or Orthoses: Spinal Brace Spinal Brace: Applied in sitting position;Lumbar corset Restrictions Weight Bearing Restrictions: No      Mobility  Bed Mobility Overal bed mobility: Needs Assistance Bed Mobility: Sidelying to Sit, Rolling Rolling: Min guard Sidelying to sit: Min guard       General bed mobility comments: Bed flat with cues to grab R bed rail with L UE to facilitate roll to R, extra time due to pain. Min guard for safety and cues throughout for maintaining spinal precautions.    Transfers Overall transfer level: Needs assistance Equipment used: Rolling walker (2 wheels) Transfers: Sit to/from Stand Sit to Stand: Min guard, Supervision           General transfer comment: Cues provided for hand placement with transfer sit <> stand, min guard for first rep from EOB with noted sway and reports of lightheadedness. Supervision for safety with second rep from toilet.    Ambulation/Gait Ambulation/Gait assistance: Min guard Gait Distance (Feet): 30 Feet (x2 bouts of ~16 ft > ~30 ft) Assistive device: Rolling walker (2 wheels) Gait Pattern/deviations: Step-through pattern, Decreased stride length Gait velocity: reduced Gait  velocity interpretation: <1.31 ft/sec, indicative of household ambulator   General Gait Details:  Pt with slow, step-through gait pattern with no other evident drastic gait deviations. No LOB, min guard for safety. Limited distance due to orthostatic hypotension  Stairs            Wheelchair Mobility    Modified Rankin (Stroke Patients Only)       Balance Overall balance assessment: Needs assistance Sitting-balance support: No upper extremity supported, Feet supported Sitting balance-Leahy Scale: Good     Standing balance support: No upper extremity supported, Bilateral upper extremity supported Standing balance-Leahy Scale: Fair Standing balance comment: Able to stand at sink without UE support, reliant on RW to ambulate                             Pertinent Vitals/Pain Pain Assessment Pain Assessment: Faces Faces Pain Scale: Hurts even more Pain Location: back Pain Descriptors / Indicators: Discomfort, Grimacing, Operative site guarding Pain Intervention(s): Limited activity within patient's tolerance, Monitored during session, Repositioned, Premedicated before session    Home Living Family/patient expects to be discharged to:: Private residence Living Arrangements: Spouse/significant other Available Help at Discharge: Family;Available PRN/intermittently (husband farms but can be around often throughout day, daughter plans to come assist 24/7 initially) Type of Home: House Home Access: Stairs to enter Entrance Stairs-Rails: Can reach both Entrance Stairs-Number of Steps: 2   Home Layout: One level;Other (Comment) (basement she does not need to access) Home Equipment: Rolling Walker (2 wheels);Cane - single point;Shower seat      Prior Function Prior Level of Function : Independent/Modified Independent;Driving;History of Falls (last six months)             Mobility Comments: No AD       Hand Dominance   Dominant Hand: Right    Extremity/Trunk Assessment   Upper Extremity Assessment Upper Extremity Assessment: Defer to OT evaluation     Lower Extremity Assessment Lower Extremity Assessment: Overall WFL for tasks assessed (MMT scores of 4+ to 5 grossly bil; hx of bil peripheral neuropathy with no other noted sensory deficits bil)    Cervical / Trunk Assessment Cervical / Trunk Assessment: Back Surgery  Communication   Communication: No difficulties  Cognition Arousal/Alertness: Awake/alert Behavior During Therapy: WFL for tasks assessed/performed Overall Cognitive Status: No family/caregiver present to determine baseline cognitive functioning                                 General Comments: Pt fluctuated in her comprehension of questions, primarily with positional changes, likely affected by noted orthostatic hypotension; likely otherwise at baseline        General Comments General comments (skin integrity, edema, etc.): BP: 135/67 sitting EOB with reports of lightheadedness, 95/46 standing with reports of lightheadedness, 108/65 sitting in chair end of session; encouraged frequent positional changes and mobility    Exercises     Assessment/Plan    PT Assessment Patient needs continued PT services  PT Problem List Decreased activity tolerance;Decreased balance;Decreased mobility;Decreased knowledge of precautions;Impaired sensation;Pain       PT Treatment Interventions DME instruction;Gait training;Functional mobility training;Stair training;Therapeutic activities;Therapeutic exercise;Balance training;Neuromuscular re-education;Patient/family education    PT Goals (Current goals can be found in the Care Plan section)  Acute Rehab PT Goals Patient Stated Goal: to improve PT Goal Formulation: With patient Time For Goal Achievement: 02/10/23 Potential to Achieve Goals:  Good    Frequency Min 5X/week     Co-evaluation               AM-PAC PT "6 Clicks" Mobility  Outcome Measure Help needed turning from your back to your side while in a flat bed without using bedrails?: A Little Help  needed moving from lying on your back to sitting on the side of a flat bed without using bedrails?: A Little Help needed moving to and from a bed to a chair (including a wheelchair)?: A Little Help needed standing up from a chair using your arms (e.g., wheelchair or bedside chair)?: A Little Help needed to walk in hospital room?: A Little Help needed climbing 3-5 steps with a railing? : A Little 6 Click Score: 18    End of Session Equipment Utilized During Treatment: Gait belt;Back brace Activity Tolerance: Patient tolerated treatment well Patient left: in chair;with call bell/phone within reach Nurse Communication: Mobility status (BP) PT Visit Diagnosis: Unsteadiness on feet (R26.81);Other abnormalities of gait and mobility (R26.89);History of falling (Z91.81);Difficulty in walking, not elsewhere classified (R26.2);Pain Pain - part of body:  (back)    Time: 0981-1914 PT Time Calculation (min) (ACUTE ONLY): 34 min   Charges:   PT Evaluation $PT Eval Moderate Complexity: 1 Mod PT Treatments $Therapeutic Activity: 8-22 mins        Raymond Gurney, PT, DPT Acute Rehabilitation Services  Office: [phone]   Jewel Baize 02/03/2023, 8:39 AM

## 2023-02-06 ENCOUNTER — Other Ambulatory Visit (HOSPITAL_COMMUNITY): Payer: Self-pay

## 2023-02-06 ENCOUNTER — Encounter (HOSPITAL_COMMUNITY): Payer: Self-pay | Admitting: Neurosurgery

## 2023-02-09 ENCOUNTER — Telehealth: Payer: Self-pay | Admitting: Physician Assistant

## 2023-02-09 NOTE — Telephone Encounter (Signed)
Contacted patient to scheduled appointments. Patient is aware of appointments that are scheduled.   

## 2023-02-10 MED FILL — Heparin Sodium (Porcine) Inj 1000 Unit/ML: INTRAMUSCULAR | Qty: 30 | Status: AC

## 2023-02-10 MED FILL — Sodium Chloride IV Soln 0.9%: INTRAVENOUS | Qty: 1000 | Status: AC

## 2023-02-10 NOTE — Telephone Encounter (Signed)
Pt had her surgery 02/02/23.   No new orders given at this time

## 2023-02-12 ENCOUNTER — Other Ambulatory Visit (HOSPITAL_COMMUNITY): Payer: Self-pay

## 2023-02-17 ENCOUNTER — Other Ambulatory Visit (HOSPITAL_COMMUNITY): Payer: Self-pay

## 2023-02-18 ENCOUNTER — Other Ambulatory Visit (HOSPITAL_COMMUNITY): Payer: Self-pay

## 2023-02-20 ENCOUNTER — Other Ambulatory Visit: Payer: Medicare Other

## 2023-02-20 ENCOUNTER — Other Ambulatory Visit (HOSPITAL_COMMUNITY): Payer: Self-pay

## 2023-02-20 ENCOUNTER — Ambulatory Visit: Payer: Medicare Other | Admitting: Physician Assistant

## 2023-02-26 ENCOUNTER — Other Ambulatory Visit: Payer: Self-pay | Admitting: Hematology and Oncology

## 2023-02-26 DIAGNOSIS — C9110 Chronic lymphocytic leukemia of B-cell type not having achieved remission: Secondary | ICD-10-CM

## 2023-02-26 DIAGNOSIS — C50011 Malignant neoplasm of nipple and areola, right female breast: Secondary | ICD-10-CM

## 2023-02-27 ENCOUNTER — Inpatient Hospital Stay: Payer: Medicare Other | Attending: Hematology and Oncology

## 2023-02-27 ENCOUNTER — Other Ambulatory Visit: Payer: Self-pay

## 2023-02-27 ENCOUNTER — Other Ambulatory Visit: Payer: Self-pay | Admitting: Physician Assistant

## 2023-02-27 ENCOUNTER — Inpatient Hospital Stay: Payer: Medicare Other | Admitting: Physician Assistant

## 2023-02-27 VITALS — BP 143/81 | HR 86 | Temp 98.8°F | Resp 17 | Ht 63.0 in | Wt 172.9 lb

## 2023-02-27 DIAGNOSIS — Z79899 Other long term (current) drug therapy: Secondary | ICD-10-CM | POA: Insufficient documentation

## 2023-02-27 DIAGNOSIS — G629 Polyneuropathy, unspecified: Secondary | ICD-10-CM | POA: Insufficient documentation

## 2023-02-27 DIAGNOSIS — Z853 Personal history of malignant neoplasm of breast: Secondary | ICD-10-CM | POA: Diagnosis not present

## 2023-02-27 DIAGNOSIS — C9110 Chronic lymphocytic leukemia of B-cell type not having achieved remission: Secondary | ICD-10-CM

## 2023-02-27 DIAGNOSIS — Z9011 Acquired absence of right breast and nipple: Secondary | ICD-10-CM | POA: Insufficient documentation

## 2023-02-27 LAB — CBC WITH DIFFERENTIAL (CANCER CENTER ONLY)
Abs Immature Granulocytes: 0.06 10*3/uL (ref 0.00–0.07)
Basophils Absolute: 0.1 10*3/uL (ref 0.0–0.1)
Basophils Relative: 0 %
Eosinophils Absolute: 0.4 10*3/uL (ref 0.0–0.5)
Eosinophils Relative: 3 %
HCT: 36.6 % (ref 36.0–46.0)
Hemoglobin: 11.5 g/dL — ABNORMAL LOW (ref 12.0–15.0)
Immature Granulocytes: 0 %
Lymphocytes Relative: 46 %
Lymphs Abs: 7.1 10*3/uL — ABNORMAL HIGH (ref 0.7–4.0)
MCH: 30.7 pg (ref 26.0–34.0)
MCHC: 31.4 g/dL (ref 30.0–36.0)
MCV: 97.9 fL (ref 80.0–100.0)
Monocytes Absolute: 0.9 10*3/uL (ref 0.1–1.0)
Monocytes Relative: 6 %
Neutro Abs: 7.2 10*3/uL (ref 1.7–7.7)
Neutrophils Relative %: 45 %
Platelet Count: 259 10*3/uL (ref 150–400)
RBC: 3.74 MIL/uL — ABNORMAL LOW (ref 3.87–5.11)
RDW: 13.8 % (ref 11.5–15.5)
Smear Review: NORMAL
WBC Count: 15.8 10*3/uL — ABNORMAL HIGH (ref 4.0–10.5)
nRBC: 0 % (ref 0.0–0.2)

## 2023-02-27 LAB — CMP (CANCER CENTER ONLY)
ALT: 14 U/L (ref 0–44)
AST: 19 U/L (ref 15–41)
Albumin: 4.4 g/dL (ref 3.5–5.0)
Alkaline Phosphatase: 123 U/L (ref 38–126)
Anion gap: 9 (ref 5–15)
BUN: 11 mg/dL (ref 8–23)
CO2: 25 mmol/L (ref 22–32)
Calcium: 9.6 mg/dL (ref 8.9–10.3)
Chloride: 107 mmol/L (ref 98–111)
Creatinine: 0.89 mg/dL (ref 0.44–1.00)
GFR, Estimated: >60 mL/min (ref >60)
Glucose, Bld: 88 mg/dL (ref 70–99)
Potassium: 4.1 mmol/L (ref 3.5–5.1)
Sodium: 141 mmol/L (ref 135–145)
Total Bilirubin: 0.5 mg/dL (ref 0.3–1.2)
Total Protein: 6.3 g/dL — ABNORMAL LOW (ref 6.5–8.1)

## 2023-02-27 LAB — LACTATE DEHYDROGENASE: LDH: 192 U/L (ref 98–192)

## 2023-02-27 NOTE — Progress Notes (Signed)
[facility] [phone]   [phone]  PROGRESS NOTE  Patient Care Team: Merri Brunette, MD as PCP - General (Internal Medicine) Jake Bathe, MD as PCP - Cardiology (Cardiology) Rachael Fee, MD as Attending Physician (Gastroenterology) Jerene Bears, MD as Consulting Physician (Gynecology) Manus Rudd, MD as Consulting Physician (General Surgery) Ellis Savage, NP as Nurse Practitioner Samson Frederic, MD as Consulting Physician (Orthopedic Surgery) Ranee Gosselin, MD as Consulting Physician (Orthopedic Surgery) Elenora Fender, MD as Attending Physician (Radiology) Jeronimo Norma, MD as Referring Physician (Orthopedic Surgery) Jaci Standard, MD as Consulting Physician (Hematology and Oncology)  Hematological/Oncological History # CLL Rai Stage 1  06/04/2021: last visit with Dr. Darnelle Catalan. Detailed history of his care history noted below.  09/18/2021: WBC 43.3, Hgb 13.0, MCV 87.1, Plt 275 09/23/2021: establish care with Dr. Leonides Schanz. WBC 30.1, Hgb 12.3, MCV 87.8, Plt 241 03/11/2022: WBC 57.4, Hgb 11.2, MCV 88.3, Plt 181  04/29/2022: Started acalabrutinib 100 mg PO twice daily  Interval History:  [name] [name] 77 y.o. 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 The patient's last visit was on 01/27/2023. In the interim since the last visit, she underwent spinal fusion T6-7 T7-8 T8-9 vertebrae on 02/03/2023. She has resumed acalabrutinib therapy after holding temporarily for surgery.   On exam today Mrs. Digiulio is accompanied by her husband. She reports she is recovering from her back surgery. She still has some back pain and stiffness which impacts her ADLs. She reports she is tolerating acalabrutinib therapy without any new or concerning symptoms. She denies nausea, vomiting or abdominal pain. Her bowel habits are unchanged without recurrent episodes of diarrhea or  constipation. She denies easy bruising or signs of active bleeding. 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 She denies any fevers, chills, sweats, shortness of breath, chest pain, cough, headaches or dizziness. She has no other complaints. Rest of 10 point ROS is listed below.   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   DDD (degenerative disc disease)    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 ear    Depression    Diverticulosis    Elevated LFTs    Fibromyalgia 1978   GERD (gastroesophageal reflux disease)    Hematuria    History of syncope    episode 2008--  no recurrence since   HLD (hyperlipidemia)    Hypothyroidism    Kidney stones 2014   Plantar fasciitis    Ruptured disk    one in neck and two in back   S/P chemotherapy, time since greater than 12 weeks    Sinus tachycardia    mild resting   Skin cancer    nose   Stool incontinence    "at times recently"     SURGICAL HISTORY: Past Surgical History:  Procedure Laterality Date   ANTERIOR LATERAL LUMBAR FUSION 4 LEVELS Left 04/25/2016   Procedure: LEFT LUMBAR ONE-TWO, LUMBAR TWO-THREE, LUMBAR THREE-FOUR, LUMBAR FOUR-FIVE ANTERIOR LATERAL LUMBAR FUSION;  Surgeon: Julio Sicks, MD;  Location: [facility]  NEURO ORS;  Service: Neurosurgery;  Laterality: Left;   APPENDECTOMY  1997   APPLICATION OF ROBOTIC ASSISTANCE FOR SPINAL PROCEDURE N/A 04/06/2017   Procedure: APPLICATION OF ROBOTIC ASSISTANCE FOR SPINAL PROCEDURE;  Surgeon: Julio Sicks, MD;  Location: [facility] OR;  Service: Neurosurgery;  Laterality: N/A;   BREAST BIOPSY Right 2014   BREAST LUMPECTOMY Right 1997   BREAST LUMPECTOMY WITH AXILLARY  LYMPH NODE DISSECTION Right 1997   CATARACT EXTRACTION, BILATERAL  2019   Dr. Alben Spittle   CYSTOSCOPY WITH RETROGRADE PYELOGRAM, URETEROSCOPY AND STENT PLACEMENT Bilateral 06/17/2013   Procedure: CYSTOSCOPY WITH RETROGRADE PYELOGRAM, URETEROSCOPY AND LEFT DOUBLE  J STENT PLACEMENT RIGHT URETERAL HOLMIIUM LASER AND DOUBLE J STENT ;  Surgeon: Lindaann Slough, MD;  Location: [facility];  Service: Urology;  Laterality: Bilateral;   HOLMIUM LASER APPLICATION Bilateral 06/17/2013   Procedure: HOLMIUM LASER APPLICATION;  Surgeon: Lindaann Slough, MD;  Location: [facility];  Service: Urology;  Laterality: Bilateral;   LITHOTRIPSY Left 06/2013   LUMBAR EPIDURAL INJECTION     has had 7 injections   PORT-A-CATH REMOVAL Left 10/03/2014   Procedure: REMOVAL PORT-A-CATH;  Surgeon: Manus Rudd, MD;  Location: [facility];  Service: General;  Laterality: Left;   PORTACATH PLACEMENT Left 07/28/2013   Procedure: ATTEMPTED INSERTION PORT-A-CATH;  Surgeon: Wilmon Arms. Corliss Skains, MD;  Location: [facility] OR;  Service: General;  Laterality: Left;   POSTERIOR LUMBAR FUSION 4 LEVEL N/A 04/25/2016   Procedure: LUMBAR FIVE-SACRAL ONE POSTERIOR LUMBAR INTERBODY FUSION, THORACIC NINE-SACRAL ONE POSTERIOR LATERAL ARTHRODESIS WITH PEDICLE SCREWS;  Surgeon: Julio Sicks, MD;  Location: [facility] NEURO ORS;  Service: Neurosurgery;  Laterality: N/A;   RETINAL DETACHMENT SURGERY Right 2013   ROBOTIC ASSITED PARTIAL NEPHRECTOMY Right 02/05/2015   Procedure: ROBOTIC ASSITED PARTIAL NEPHRECTOMY;  Surgeon: Heloise Purpura, MD;  Location: [facility] ORS;  Service: Urology;  Laterality: Right;   SIMPLE MASTECTOMY WITH AXILLARY SENTINEL NODE BIOPSY Right 07/28/2013   Procedure: RIGHT TOTAL  MASTECTOMY;  Surgeon: Wilmon Arms. Corliss Skains, MD;  Location: [facility] OR;  Service: General;  Laterality: Right;   TONSILLECTOMY  AGE 10   TOTAL ABDOMINAL HYSTERECTOMY W/ BILATERAL SALPINGOOPHORECTOMY  1997    SOCIAL HISTORY: Social History    Socioeconomic History   Marital status: Married    Spouse name: Philip   Number of children: 1   Years of education: Not on file   Highest education level: Not on file  Occupational History   Occupation: homemaker  Tobacco Use   Smoking status: Never   Smokeless tobacco: Never  Vaping Use   Vaping Use: Never used  Substance and Sexual Activity   Alcohol use: No   Drug use: No   Sexual activity: Not Currently    Partners: Male    Birth control/protection: Surgical    Comment: TAH/BSO  Other Topics Concern   Not on file  Social History Narrative   Not on file   Social Determinants of Health   Financial Resource Strain: Not on file  Food Insecurity: No Food Insecurity (12/19/2022)   Hunger Vital Sign    Worried About Running Out of Food in the Last Year: Never true    Ran Out of Food in the Last Year: Never true  Transportation Needs: No Transportation Needs (12/19/2022)   PRAPARE - Administrator, Civil Service (Medical): No    Lack of Transportation (Non-Medical): No  Physical Activity: Not on file  Stress: Not on file  Social Connections: Not on file  Intimate Partner Violence: Not At Risk (12/19/2022)   Humiliation, Afraid, Rape, and Kick questionnaire    Fear of Current or Ex-Partner: No    Emotionally Abused: No    Physically Abused: No    Sexually Abused: No    FAMILY HISTORY: Family History  Problem Relation Age of Onset   Heart disease Maternal Grandfather    Diabetes Maternal Grandfather    Colon cancer Maternal Aunt 2   Brain cancer Paternal Grandmother        dx in 8s   Dementia Mother    Diabetes Mother    Osteoporosis Mother    Diabetes Maternal Aunt    Prostate cancer Father 5   Bipolar disorder Maternal Aunt    Stroke Maternal Aunt    Stomach cancer Paternal Uncle        dx in late 53s    ALLERGIES:  is allergic to furosemide, lithium, sulfa antibiotics, trazodone and nefazodone, and lyrica [pregabalin].  MEDICATIONS:   Current Outpatient Medications  Medication Sig Dispense Refill   acalabrutinib maleate (CALQUENCE) 100 MG tablet Take 1 tablet (100 mg total) by mouth 2 (two) times daily. 60 tablet 2   allopurinol (ZYLOPRIM) 300 MG tablet TAKE 1 TABLET(300 MG) BY MOUTH DAILY (Patient taking differently: Take 300 mg by mouth daily.) 90 tablet 4   amitriptyline (ELAVIL) 100 MG tablet Take 100 mg by mouth at bedtime.     aspirin 81 MG EC tablet Take 81 mg by mouth daily.     brexpiprazole (REXULTI) 1 MG TABS tablet Take 1 mg by mouth daily.     buPROPion (WELLBUTRIN XL) 300 MG 24 hr tablet Take 300 mg by mouth daily.     Cholecalciferol (VITAMIN D3) 50 MCG (2000 UT) TABS Take 2,000 Units by mouth daily.     COLLAGEN PO Take 4 tablets by mouth daily. Vital protein gummy     Cyanocobalamin (VITAMIN B12) 1000 MCG TBCR Take 1,000 Units by mouth daily.     ferrous sulfate 325 (65 FE) MG tablet Take 325 mg by mouth daily with breakfast.     fluvoxaMINE (LUVOX) 50 MG tablet Take 100 mg by mouth at bedtime.     gabapentin (NEURONTIN) 100 MG capsule Take 200 mg by mouth 2 (two) times daily.     ibuprofen (ADVIL) 200 MG tablet Take 600 mg by mouth in the morning.     levothyroxine (SYNTHROID, LEVOTHROID) 88 MCG tablet Take 88 mcg by mouth daily before breakfast.  4   Multiple Vitamins-Minerals (WOMENS 50+ MULTI VITAMIN/MIN) TABS Take 1 tablet by mouth daily.     omeprazole (PRILOSEC) 40 MG capsule TAKE 1 CAPSULE(40 MG) BY MOUTH DAILY AFTER BREAKFAST (Patient taking differently: Take 40 mg by mouth daily. TAKE 1 CAPSULE(40 MG) BY MOUTH DAILY AFTER BREAKFAST) 90 capsule 0   rosuvastatin (CRESTOR) 5 MG tablet Take 1 tablet (5 mg total) by mouth daily. 30 tablet 8   sodium chloride 1 g tablet Take 1 tablet (1 g total) by mouth 2 (two) times daily with a meal. (Patient taking differently: Take 1 g by mouth daily.) 60 tablet 2   zinc gluconate 50 MG tablet Take 50 mg by mouth daily.     diclofenac Sodium (VOLTAREN) 1 % GEL  Apply 2 g topically 4 (four) times daily. (Patient not taking: Reported on 01/20/2023) 2 g 3   No current facility-administered medications for this visit.    REVIEW OF SYSTEMS:   Constitutional: ( - ) fevers, ( - )  chills , ( - ) night sweats Eyes: ( - ) blurriness of vision, ( - ) double vision, ( - ) watery eyes Ears, nose, mouth, throat, and face: ( - ) mucositis, ( - ) sore throat Respiratory: ( - ) cough, ( - ) dyspnea, ( - ) wheezes Cardiovascular: ( - ) palpitation, ( - ) chest discomfort, ( - ) lower extremity swelling Gastrointestinal:  ( - ) nausea, ( - ) heartburn, ( - ) change in bowel habits Skin: ( - ) abnormal skin rashes Lymphatics: ( - ) new lymphadenopathy, ( - ) easy bruising Neurological: ( - ) numbness, ( - ) tingling, ( - ) new weaknesses Behavioral/Psych: ( - ) mood change, ( - ) new changes  All other systems were reviewed with the patient and are negative.  PHYSICAL EXAMINATION:  Vitals:   01/27/23 1256  BP: 106/60  Pulse: 93  Resp: 16  Temp: (!) 97.3 F (36.3 C)  SpO2: 99%      Filed Weights   01/27/23 1256  Weight: 168 lb 3.2 oz (76.3 kg)      GENERAL: well appearing elderly Caucasian female. alert, no distress and comfortable SKIN:  skin color, texture, turgor are normal, no rashes or significant lesions EYES: conjunctiva are pink and non-injected, sclera clear LUNGS: clear to auscultation and percussion with normal breathing effort HEART: regular rate & rhythm and no murmurs and no lower extremity edema Musculoskeletal: no cyanosis of digits and no clubbing  PSYCH: alert & oriented x 3, fluent speech NEURO: no focal motor/sensory deficits  LABORATORY DATA:  I have reviewed the data as listed    Latest Ref Rng & Units 01/27/2023   12:38 PM 01/22/2023    1:06 PM 12/21/2022    5:40 AM  CBC  WBC 4.0 - 10.5 K/uL 18.4  22.0  16.8   Hemoglobin 12.0 - 15.0 g/dL 08.6  57.8  46.9   Hematocrit 36.0 - 46.0 % 37.1  39.2  32.4   Platelets 150 -  400 K/uL 227  249  195        Latest Ref Rng & Units 01/27/2023   12:38 PM 01/22/2023    1:06 PM 12/21/2022    5:40 AM  CMP  Glucose 70 - 99 mg/dL 629  528  413   BUN 8 - 23 mg/dL 13  13  13    Creatinine 0.44 - 1.00 mg/dL 2.44  0.10  2.72   Sodium 135 - 145 mmol/L 135  130  130   Potassium 3.5 - 5.1 mmol/L 4.3  4.7  4.0   Chloride 98 - 111 mmol/L 103  98  101   CO2 22 - 32 mmol/L 25  20  18    Calcium 8.9 - 10.3 mg/dL 9.8  9.3  8.6   Total Protein 6.5 - 8.1 g/dL 6.7     Total Bilirubin 0.3 - 1.2 mg/dL 0.5     Alkaline Phos 38 - 126 U/L 93     AST 15 - 41 U/L 20     ALT 0 - 44 U/L 18       RADIOGRAPHIC STUDIES: No results found.  ASSESSMENT & PLAN NARJIS BOGUE is a 77 y.o. 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  History Adapted from Dr. Darrall Dears last note:   (1) status post right breast lumpectomy and axillary lymph node dissection in 1997 for a stage I  breast cancer, treated with adjuvant radiation and tamoxifen for 5 years   (2) status post right breast lower outer quadrant biopsy 07/13/2013 for a clinical T1c N0, stage IA invasive ductal carcinoma, grade 2, estrogen receptor 100% positive, progesterone receptor 13% positive, with an MIB-1 of 33%, and HER-2 amplification by CISH with a HER2/CEP 17 ratio of 3.19, and an average HER-2 copy number per cell of 4.15   (3) status post right mastectomy 07/28/2013 for a pT1c pN0, stage IA invasive ductal carcinoma, grade 3, with close but negative margins. Prognostic panel was not repeated             (a) the patient met with Dr. Odis Luster and has decided against reconstruction   (4) completed weekly paclitaxel x12 12/06/2913, with trastuzumab/ pertuzumab every 3 weeks; pertuzumab was held with third dose on 11/01/2013 due to diarrhea, tried a half dose on cycle 4 again with diarrhea developing   (5) trastuzumab (started 09/20/2013) continued for 1 year, last  dose 09/21/2014;             (a) final echocardiogram 09/07/2014 showed an ejection fraction of 55-60%   (6) anastrozole started May 2015; completed April 2020             (a) bone density 12/15/2013 normal             (b) bone density 02/01/2016 at [facility] was normal with a T score of -1.0     OTHER PROBLEMS: (a) History of chronic lymphoid leukemia diagnosed by flow cytometry 06/29/2013, the cells being CD5, CD20 and CD23 positive, CD10 negative.              (1) right renal mass resected on 02/05/15, consisting of atypical lymphoid proliferation composed of monotonous small lymphocytes              (2) Anemia with a normal MCV and normal ferritin-- B-12 and folate normal; stable             (3)  ibrutinib 280 milligrams daily started 03/26/2019, discontinued 06/03/2019 because of concerns regarding dosing             (4) rituximab weekly x8 started 06/15/2019 completed December 2020             (5) maintenance rituximab (Q 2 months) started 10/25/2019, continued through 08/03/2020  (6) 04/29/2022: Started acalabrutinib 100 mg PO twice daily   # CLL Rai Stage I --Due to rapid doubling time of white blood cell count from 28.3 on 10/07/2021 to 66.7 on 04/09/2022, recommendation was to initiate therapy with acalabrutinib 100 mg twice daily, started on 04/29/2022 --Last imaging of the abdomen in 2022 showed no evidence of splenomegaly Plan:  --Resumed acalabrutinib after undergoing back surgery on 02/02/2023 and will resume 7 days after.  --Labs today show WBC 15.8, hemoglobin 11.5, MCV 97.9, and platelets of 259.  Creatinine and LFTs are normal.  --Continue on same dosage with acalabrutinib at 100 mg twice daily. --RTC in 4 weeks with labs.   #Head throbbing-resolved.  --Patient reports throbbing sensation without headaches. Likely secondary to acalabrutinib. After temporarily holding medication, symptoms have improved.  --Advised that headaches respond well to caffeine and trying to drink a  caffeinated beverage. --Monitor for now and advised to follow up if symptoms worsen.  # ER + Breast Cancer in Survivorship -- Continue yearly mammograms. --last mammogram on 06/09/2022, next due in October 2024.  --Currently in survivorship.  No orders of the defined types were  placed in this encounter.  All questions were answered. The patient knows to call the clinic with any problems, questions or concerns.  I have spent a total of 25 minutes minutes of face-to-face and non-face-to-face time, preparing to see the patient, performing a medically appropriate examination, counseling and educating the patient, ordering tests/procedures, documenting clinical information in the electronic health record, and care coordination.   Georga Kaufmann PA-C Dept of Hematology and Oncology [facility] Phone: [phone]    01/27/2023 1:46 PM

## 2023-03-12 DIAGNOSIS — M5414 Radiculopathy, thoracic region: Secondary | ICD-10-CM | POA: Diagnosis not present

## 2023-03-17 ENCOUNTER — Other Ambulatory Visit: Payer: Self-pay

## 2023-03-19 ENCOUNTER — Telehealth: Payer: Self-pay | Admitting: *Deleted

## 2023-03-19 NOTE — Progress Notes (Signed)
  Care Coordination  Outreach Note  03/19/2023 Name: [name] [name] MRN: [id number] DOB: June 21, 1946   Care Coordination Outreach Attempts: An unsuccessful telephone outreach was attempted today to offer the patient information about available care coordination services.  Follow Up Plan:  Additional outreach attempts will be made to offer the patient care coordination information and services.   Encounter Outcome:  No Answer  Burman Nieves, CCMA Care Coordination Care Guide Direct Dial: [phone]

## 2023-03-20 ENCOUNTER — Other Ambulatory Visit: Payer: Self-pay

## 2023-03-25 ENCOUNTER — Other Ambulatory Visit: Payer: Self-pay

## 2023-03-27 ENCOUNTER — Inpatient Hospital Stay: Payer: Medicare Other

## 2023-03-27 ENCOUNTER — Inpatient Hospital Stay: Payer: Medicare Other | Attending: Hematology and Oncology | Admitting: Hematology and Oncology

## 2023-03-27 ENCOUNTER — Other Ambulatory Visit: Payer: Self-pay

## 2023-03-27 VITALS — BP 115/52 | HR 82 | Temp 98.2°F | Resp 16 | Wt 177.3 lb

## 2023-03-27 DIAGNOSIS — Z170 Estrogen receptor positive status [ER+]: Secondary | ICD-10-CM

## 2023-03-27 DIAGNOSIS — C50511 Malignant neoplasm of lower-outer quadrant of right female breast: Secondary | ICD-10-CM | POA: Diagnosis not present

## 2023-03-27 DIAGNOSIS — Z853 Personal history of malignant neoplasm of breast: Secondary | ICD-10-CM | POA: Diagnosis not present

## 2023-03-27 DIAGNOSIS — Z79899 Other long term (current) drug therapy: Secondary | ICD-10-CM | POA: Insufficient documentation

## 2023-03-27 DIAGNOSIS — C9110 Chronic lymphocytic leukemia of B-cell type not having achieved remission: Secondary | ICD-10-CM

## 2023-03-27 DIAGNOSIS — Z923 Personal history of irradiation: Secondary | ICD-10-CM | POA: Diagnosis not present

## 2023-03-27 DIAGNOSIS — Z7982 Long term (current) use of aspirin: Secondary | ICD-10-CM | POA: Diagnosis not present

## 2023-03-27 LAB — CMP (CANCER CENTER ONLY)
ALT: 19 U/L (ref 0–44)
AST: 20 U/L (ref 15–41)
Albumin: 4.2 g/dL (ref 3.5–5.0)
Alkaline Phosphatase: 99 U/L (ref 38–126)
Anion gap: 8 (ref 5–15)
BUN: 20 mg/dL (ref 8–23)
CO2: 24 mmol/L (ref 22–32)
Calcium: 9 mg/dL (ref 8.9–10.3)
Chloride: 107 mmol/L (ref 98–111)
Creatinine: 1 mg/dL (ref 0.44–1.00)
GFR, Estimated: 58 mL/min — ABNORMAL LOW (ref >60)
Glucose, Bld: 106 mg/dL — ABNORMAL HIGH (ref 70–99)
Potassium: 4.2 mmol/L (ref 3.5–5.1)
Sodium: 139 mmol/L (ref 135–145)
Total Bilirubin: 0.4 mg/dL (ref 0.3–1.2)
Total Protein: 6.1 g/dL — ABNORMAL LOW (ref 6.5–8.1)

## 2023-03-27 LAB — CBC WITH DIFFERENTIAL (CANCER CENTER ONLY)
Abs Immature Granulocytes: 0.04 10*3/uL (ref 0.00–0.07)
Basophils Absolute: 0.1 10*3/uL (ref 0.0–0.1)
Basophils Relative: 0 %
Eosinophils Absolute: 0.6 10*3/uL — ABNORMAL HIGH (ref 0.0–0.5)
Eosinophils Relative: 5 %
HCT: 34.6 % — ABNORMAL LOW (ref 36.0–46.0)
Hemoglobin: 11.4 g/dL — ABNORMAL LOW (ref 12.0–15.0)
Immature Granulocytes: 0 %
Lymphocytes Relative: 59 %
Lymphs Abs: 8 10*3/uL — ABNORMAL HIGH (ref 0.7–4.0)
MCH: 31 pg (ref 26.0–34.0)
MCHC: 32.9 g/dL (ref 30.0–36.0)
MCV: 94 fL (ref 80.0–100.0)
Monocytes Absolute: 0.7 10*3/uL (ref 0.1–1.0)
Monocytes Relative: 5 %
Neutro Abs: 4.2 10*3/uL (ref 1.7–7.7)
Neutrophils Relative %: 31 %
Platelet Count: 196 10*3/uL (ref 150–400)
RBC: 3.68 MIL/uL — ABNORMAL LOW (ref 3.87–5.11)
RDW: 13.9 % (ref 11.5–15.5)
WBC Count: 13.6 10*3/uL — ABNORMAL HIGH (ref 4.0–10.5)
nRBC: 0 % (ref 0.0–0.2)

## 2023-03-27 LAB — LACTATE DEHYDROGENASE: LDH: 134 U/L (ref 98–192)

## 2023-03-27 NOTE — Progress Notes (Unsigned)
[facility] [phone]   [phone]  PROGRESS NOTE  Patient Care Team: Merri Brunette, MD as PCP - General (Internal Medicine) Jake Bathe, MD as PCP - Cardiology (Cardiology) Rachael Fee, MD as Attending Physician (Gastroenterology) Jerene Bears, MD as Consulting Physician (Gynecology) Manus Rudd, MD as Consulting Physician (General Surgery) Ellis Savage, NP as Nurse Practitioner Samson Frederic, MD as Consulting Physician (Orthopedic Surgery) Ranee Gosselin, MD as Consulting Physician (Orthopedic Surgery) Elenora Fender, MD as Attending Physician (Radiology) Jeronimo Norma, MD as Referring Physician (Orthopedic Surgery) Jaci Standard, MD as Consulting Physician (Hematology and Oncology)  Hematological/Oncological History # CLL Rai Stage 1  06/04/2021: last visit with Dr. Darnelle Catalan. Detailed history of his care history noted below.  09/18/2021: WBC 43.3, Hgb 13.0, MCV 87.1, Plt 275 09/23/2021: establish care with Dr. Leonides Schanz. WBC 30.1, Hgb 12.3, MCV 87.8, Plt 241 03/11/2022: WBC 57.4, Hgb 11.2, MCV 88.3, Plt 181  04/29/2022: Started acalabrutinib 100 mg PO twice daily  Interval History:  [name] [name] 77 y.o. 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 The patient's last visit was on 02/27/2023. In the interim since the last visit, she has continued on Calquence therapy without difficulty  On exam today [name] [name] is accompanied by her husband. She reports reports that she had back surgery approximately 2 months ago but is "still sore".  She notes that she is also concerned about her sodium levels which have caused her to have 2 hospitalizations in the recent past.  She reports she is doing her best to try to stay hydrated.  She is tolerating her Calquence well with no major side effects.  She reports no palpitations, bleeding, or infectious symptoms.  Her  weight is steadily increasing and she is up to 177 pounds from 168 pounds since June.  She denies any fevers, chills, sweats, shortness of breath, chest pain, cough, headaches or dizziness. She has no other complaints. Rest of 10 point ROS is listed below.   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   DDD (degenerative disc disease)    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 ear    Depression    Diverticulosis    Elevated LFTs    Fibromyalgia 1978   GERD (gastroesophageal reflux disease)    Hematuria    History of syncope    episode 2008--  no recurrence since   HLD (hyperlipidemia)    Hypothyroidism    Kidney stones 2014   Plantar fasciitis    Ruptured disk    one in neck and two in back   S/P chemotherapy, time since greater than 12 weeks    Sinus tachycardia    mild resting   Skin cancer    nose   Stool incontinence    "at times recently"     SURGICAL HISTORY: Past Surgical History:  Procedure Laterality Date   ANTERIOR LATERAL LUMBAR FUSION 4 LEVELS Left 04/25/2016   Procedure: LEFT LUMBAR ONE-TWO, LUMBAR TWO-THREE, LUMBAR THREE-FOUR, LUMBAR FOUR-FIVE ANTERIOR LATERAL LUMBAR FUSION;  Surgeon: Julio Sicks, MD;  Location: [facility];  Service: Neurosurgery;  Laterality: Left;   APPENDECTOMY  1997   APPLICATION OF ROBOTIC ASSISTANCE FOR SPINAL PROCEDURE N/A 04/06/2017   Procedure: APPLICATION OF ROBOTIC ASSISTANCE FOR SPINAL PROCEDURE;  Surgeon: Julio Sicks, MD;  Location: [facility] OR;  Service: Neurosurgery;  Laterality: N/A;   BREAST BIOPSY Right 2014   BREAST LUMPECTOMY Right 1997   BREAST LUMPECTOMY WITH AXILLARY LYMPH NODE DISSECTION Right 1997   CATARACT EXTRACTION, BILATERAL  2019    Dr. Alben Spittle   CYSTOSCOPY WITH RETROGRADE PYELOGRAM, URETEROSCOPY AND STENT PLACEMENT Bilateral 06/17/2013   Procedure: CYSTOSCOPY WITH RETROGRADE PYELOGRAM, URETEROSCOPY AND LEFT DOUBLE  J STENT PLACEMENT RIGHT URETERAL HOLMIIUM LASER AND DOUBLE J STENT ;  Surgeon: Lindaann Slough, MD;  Location: [facility];  Service: Urology;  Laterality: Bilateral;   HOLMIUM LASER APPLICATION Bilateral 06/17/2013   Procedure: HOLMIUM LASER APPLICATION;  Surgeon: Lindaann Slough, MD;  Location: [facility] ;  Service: Urology;  Laterality: Bilateral;   LAMINECTOMY WITH POSTERIOR LATERAL ARTHRODESIS LEVEL 3 N/A 02/02/2023   Procedure: Thoracic six - Thoracic nine Posterior lateral fusion;  Surgeon: Julio Sicks, MD;  Location: [facility] OR;  Service: Neurosurgery;  Laterality: N/A;   LITHOTRIPSY Left 06/2013   LUMBAR EPIDURAL INJECTION     has had 7 injections   PORT-A-CATH REMOVAL Left 10/03/2014   Procedure: REMOVAL PORT-A-CATH;  Surgeon: Manus Rudd, MD;  Location: [facility];  Service: General;  Laterality: Left;   PORTACATH PLACEMENT Left 07/28/2013   Procedure: ATTEMPTED INSERTION PORT-A-CATH;  Surgeon: Wilmon Arms. Corliss Skains, MD;  Location: [facility];  Service: General;  Laterality: Left;   POSTERIOR LUMBAR FUSION 4 LEVEL N/A 04/25/2016   Procedure: LUMBAR FIVE-SACRAL ONE POSTERIOR LUMBAR INTERBODY FUSION, THORACIC NINE-SACRAL ONE POSTERIOR LATERAL ARTHRODESIS WITH PEDICLE SCREWS;  Surgeon: Julio Sicks, MD;  Location: [facility];  Service: Neurosurgery;  Laterality: N/A;   RETINAL DETACHMENT SURGERY Right 2013   ROBOTIC ASSITED PARTIAL NEPHRECTOMY Right 02/05/2015   Procedure: ROBOTIC ASSITED PARTIAL NEPHRECTOMY;  Surgeon: Heloise Purpura, MD;  Location: [facility];  Service: Urology;  Laterality: Right;   SIMPLE MASTECTOMY WITH AXILLARY SENTINEL NODE BIOPSY Right 07/28/2013   Procedure: RIGHT TOTAL  MASTECTOMY;  Surgeon: Wilmon Arms. Corliss Skains, MD;  Location: [facility];  Service: General;   Laterality: Right;   TONSILLECTOMY  AGE 37   TOTAL ABDOMINAL HYSTERECTOMY W/ BILATERAL SALPINGOOPHORECTOMY  1997    SOCIAL HISTORY: Social History   Socioeconomic History   Marital status: Married    Spouse name: Philip   Number of children: 1   Years of education: Not on file   Highest education level: Not on file  Occupational History   Occupation: homemaker  Tobacco Use   Smoking status: Never   Smokeless tobacco: Never  Vaping Use   Vaping status: Never Used  Substance and Sexual Activity   Alcohol use: No   Drug use: No   Sexual activity: Not Currently    Partners: Male    Birth control/protection: Surgical    Comment: TAH/BSO  Other Topics Concern   Not on file  Social History Narrative   Not on file   Social Determinants of Health   Financial Resource Strain: Not on file  Food Insecurity: No Food Insecurity (12/19/2022)   Hunger Vital Sign    Worried About Running Out of Food in the Last Year: Never true    Ran Out of Food in the Last Year: Never true  Transportation Needs: No Transportation Needs (  12/19/2022)   PRAPARE - Administrator, Civil Service (Medical): No    Lack of Transportation (Non-Medical): No  Physical Activity: Not on file  Stress: Not on file  Social Connections: Unknown (03/10/2022)   Received from [facility]   Social Network    Social Network: Not on file  Intimate Partner Violence: Not At Risk (12/19/2022)   Humiliation, Afraid, Rape, and Kick questionnaire    Fear of Current or Ex-Partner: No    Emotionally Abused: No    Physically Abused: No    Sexually Abused: No    FAMILY HISTORY: Family History  Problem Relation Age of Onset   Heart disease Maternal Grandfather    Diabetes Maternal Grandfather    Colon cancer Maternal Aunt 35   Brain cancer Paternal Grandmother        dx in 75s   Dementia Mother    Diabetes Mother    Osteoporosis Mother    Diabetes Maternal Aunt    Prostate cancer Father 72    Bipolar disorder Maternal Aunt    Stroke Maternal Aunt    Stomach cancer Paternal Uncle        dx in late 78s    ALLERGIES:  is allergic to furosemide, lithium, sulfa antibiotics, trazodone and nefazodone, and lyrica [pregabalin].  MEDICATIONS:  Current Outpatient Medications  Medication Sig Dispense Refill   acalabrutinib maleate (CALQUENCE) 100 MG tablet Take 1 tablet (100 mg total) by mouth 2 (two) times daily. 60 tablet 2   allopurinol (ZYLOPRIM) 300 MG tablet TAKE 1 TABLET(300 MG) BY MOUTH DAILY (Patient taking differently: Take 300 mg by mouth daily.) 90 tablet 4   amitriptyline (ELAVIL) 100 MG tablet Take 100 mg by mouth at bedtime.     aspirin 81 MG EC tablet Take 81 mg by mouth daily.     brexpiprazole (REXULTI) 1 MG TABS tablet Take 1 mg by mouth daily.     buPROPion (WELLBUTRIN XL) 300 MG 24 hr tablet Take 300 mg by mouth daily.     Cholecalciferol (VITAMIN D3) 50 MCG (2000 UT) TABS Take 2,000 Units by mouth daily.     COLLAGEN PO Take 4 tablets by mouth daily. Vital protein gummy     Cyanocobalamin (VITAMIN B12) 1000 MCG TBCR Take 1,000 Units by mouth daily.     cyclobenzaprine (FLEXERIL) 10 MG tablet Take 1 tablet (10 mg total) by mouth 3 (three) times daily as needed for muscle spasms. 30 tablet 0   diclofenac Sodium (VOLTAREN) 1 % GEL Apply 2 g topically 4 (four) times daily. (Patient not taking: Reported on 01/20/2023) 2 g 3   ferrous sulfate 325 (65 FE) MG tablet Take 325 mg by mouth daily with breakfast.     fluvoxaMINE (LUVOX) 50 MG tablet Take 100 mg by mouth at bedtime.     gabapentin (NEURONTIN) 100 MG capsule Take 200 mg by mouth 2 (two) times daily.     ibuprofen (ADVIL) 200 MG tablet Take 600 mg by mouth in the morning.     levothyroxine (SYNTHROID, LEVOTHROID) 88 MCG tablet Take 88 mcg by mouth daily before breakfast.  4   Multiple Vitamins-Minerals (WOMENS 50+ MULTI VITAMIN/MIN) TABS Take 1 tablet by mouth daily.     omeprazole (PRILOSEC) 40 MG capsule TAKE 1  CAPSULE(40 MG) BY MOUTH DAILY AFTER BREAKFAST 90 capsule 0   oxyCODONE 10 MG TABS Take 1 tablet (10 mg total) by mouth every 3 (three) hours as needed for severe pain ((  score 7 to 10)). 40 tablet 0   rosuvastatin (CRESTOR) 5 MG tablet Take 1 tablet (5 mg total) by mouth daily. 30 tablet 8   zinc gluconate 50 MG tablet Take 50 mg by mouth daily.     No current facility-administered medications for this visit.    REVIEW OF SYSTEMS:   Constitutional: ( - ) fevers, ( - )  chills , ( - ) night sweats Eyes: ( - ) blurriness of vision, ( - ) double vision, ( - ) watery eyes Ears, nose, mouth, throat, and face: ( - ) mucositis, ( - ) sore throat Respiratory: ( - ) cough, ( - ) dyspnea, ( - ) wheezes Cardiovascular: ( - ) palpitation, ( - ) chest discomfort, ( - ) lower extremity swelling Gastrointestinal:  ( - ) nausea, ( - ) heartburn, ( - ) change in bowel habits Skin: ( - ) abnormal skin rashes Lymphatics: ( - ) new lymphadenopathy, ( - ) easy bruising Neurological: ( - ) numbness, ( - ) tingling, ( - ) new weaknesses Behavioral/Psych: ( - ) mood change, ( - ) new changes  All other systems were reviewed with the patient and are negative.  PHYSICAL EXAMINATION:  Vitals:   03/27/23 1130  BP: (!) 115/52  Pulse: 82  Resp: 16  Temp: 98.2 F (36.8 C)  SpO2: 98%      Filed Weights   03/27/23 1130  Weight: 177 lb 4.8 oz (80.4 kg)      GENERAL: well appearing elderly Caucasian female. alert, no distress and comfortable SKIN:  skin color, texture, turgor are normal, no rashes or significant lesions EYES: conjunctiva are pink and non-injected, sclera clear LUNGS: clear to auscultation and percussion with normal breathing effort HEART: regular rate & rhythm and no murmurs and no lower extremity edema Musculoskeletal: no cyanosis of digits and no clubbing  PSYCH: alert & oriented x 3, fluent speech NEURO: no focal motor/sensory deficits  LABORATORY DATA:  I have reviewed the data  as listed    Latest Ref Rng & Units 03/27/2023   10:59 AM 02/27/2023    2:35 PM 01/27/2023   12:38 PM  CBC  WBC 4.0 - 10.5 K/uL 13.6  15.8  18.4   Hemoglobin 12.0 - 15.0 g/dL 84.1  32.4  40.1   Hematocrit 36.0 - 46.0 % 34.6  36.6  37.1   Platelets 150 - 400 K/uL 196  259  227        Latest Ref Rng & Units 03/27/2023   10:59 AM 02/27/2023    2:35 PM 01/27/2023   12:38 PM  CMP  Glucose 70 - 99 mg/dL 027  88  253   BUN 8 - 23 mg/dL 20  11  13    Creatinine 0.44 - 1.00 mg/dL 6.64  4.03  4.74   Sodium 135 - 145 mmol/L 139  141  135   Potassium 3.5 - 5.1 mmol/L 4.2  4.1  4.3   Chloride 98 - 111 mmol/L 107  107  103   CO2 22 - 32 mmol/L 24  25  25    Calcium 8.9 - 10.3 mg/dL 9.0  9.6  9.8   Total Protein 6.5 - 8.1 g/dL 6.1  6.3  6.7   Total Bilirubin 0.3 - 1.2 mg/dL 0.4  0.5  0.5   Alkaline Phos 38 - 126 U/L 99  123  93   AST 15 - 41 U/L 20  19  20    ALT  0 - 44 U/L 19  14  18      RADIOGRAPHIC STUDIES: No results found.  ASSESSMENT & PLAN [name] [name] is a 77 y.o. 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  History Adapted from Dr. Darrall Dears last note:   (1) status post right breast lumpectomy and axillary lymph node dissection in 1997 for a stage I breast cancer, treated with adjuvant radiation and tamoxifen for 5 years   (2) status post right breast lower outer quadrant biopsy 07/13/2013 for a clinical T1c N0, stage IA invasive ductal carcinoma, grade 2, estrogen receptor 100% positive, progesterone receptor 13% positive, with an MIB-1 of 33%, and HER-2 amplification by CISH with a HER2/CEP 17 ratio of 3.19, and an average HER-2 copy number per cell of 4.15   (3) status post right mastectomy 07/28/2013 for a pT1c pN0, stage IA invasive ductal carcinoma, grade 3, with close but negative margins. Prognostic panel was not repeated             (a) the patient met with Dr. Odis Luster and has decided against reconstruction    (4) completed weekly paclitaxel x12 12/06/2913, with trastuzumab/ pertuzumab every 3 weeks; pertuzumab was held with third dose on 11/01/2013 due to diarrhea, tried a half dose on cycle 4 again with diarrhea developing   (5) trastuzumab (started 09/20/2013) continued for 1 year, last dose 09/21/2014;             (a) final echocardiogram 09/07/2014 showed an ejection fraction of 55-60%   (6) anastrozole started May 2015; completed April 2020             (a) bone density 12/15/2013 normal             (b) bone density 02/01/2016 at [place] was normal with a T score of -1.0     OTHER PROBLEMS: (a) History of chronic lymphoid leukemia diagnosed by flow cytometry 06/29/2013, the cells being CD5, CD20 and CD23 positive, CD10 negative.              (1) right renal mass resected on 02/05/15, consisting of atypical lymphoid proliferation composed of monotonous small lymphocytes              (2) Anemia with a normal MCV and normal ferritin-- B-12 and folate normal; stable             (3)  ibrutinib 280 milligrams daily started 03/26/2019, discontinued 06/03/2019 because of concerns regarding dosing             (4) rituximab weekly x8 started 06/15/2019 completed December 2020             (5) maintenance rituximab (Q 2 months) started 10/25/2019, continued through 08/03/2020  (6) 04/29/2022: Started acalabrutinib 100 mg PO twice daily   # CLL Rai Stage I --Due to rapid doubling time of white blood cell count from 28.3 on 10/07/2021 to 66.7 on 04/09/2022, recommendation was to initiate therapy with acalabrutinib 100 mg twice daily, started on 04/29/2022 --Last imaging of the abdomen in 2022 showed no evidence of splenomegaly Plan:  --Resumed acalabrutinib after undergoing back surgery on 02/02/2023 and will resume 7 days after.  --Labs today show WBC 13.6, hemoglobin 9.4, MCV 94, and platelets of 196.  Creatinine and LFTs are normal.  --Continue on same dosage with acalabrutinib at 100 mg twice daily. --RTC in  4 weeks with labs.   #Head throbbing-resolved.  --Patient  reports throbbing sensation without headaches. Likely secondary to acalabrutinib. After temporarily holding medication, symptoms have improved.  --Advised that headaches respond well to caffeine and trying to drink a caffeinated beverage. --Monitor for now and advised to follow up if symptoms worsen.  # ER + Breast Cancer in Survivorship -- Continue yearly mammograms. --last mammogram on 06/09/2022, next due in October 2024.  --Currently in survivorship.  No orders of the defined types were placed in this encounter.  All questions were answered. The patient knows to call the clinic with any problems, questions or concerns.  I have spent a total of 25 minutes minutes of face-to-face and non-face-to-face time, preparing to see the patient, performing a medically appropriate examination, counseling and educating the patient, ordering tests/procedures, documenting clinical information in the electronic health record, and care coordination.   Ulysees Barns, MD Department of Hematology/Oncology [facility] Phone: [phone] Pager: [phone] Email: [email or web address]  03/29/2023 8:31 PM

## 2023-04-09 DIAGNOSIS — R7303 Prediabetes: Secondary | ICD-10-CM | POA: Diagnosis not present

## 2023-04-09 DIAGNOSIS — I700 Atherosclerosis of aorta: Secondary | ICD-10-CM | POA: Diagnosis not present

## 2023-04-22 ENCOUNTER — Other Ambulatory Visit: Payer: Self-pay | Admitting: Hematology and Oncology

## 2023-04-22 ENCOUNTER — Other Ambulatory Visit (HOSPITAL_COMMUNITY): Payer: Self-pay

## 2023-04-23 ENCOUNTER — Other Ambulatory Visit: Payer: Self-pay

## 2023-04-23 ENCOUNTER — Other Ambulatory Visit (HOSPITAL_COMMUNITY): Payer: Self-pay

## 2023-04-23 MED ORDER — CALQUENCE 100 MG PO TABS
100.0000 mg | ORAL_TABLET | Freq: Two times a day (BID) | ORAL | 2 refills | Status: DC
  Filled 2023-04-23: qty 60, 30d supply, fill #0
  Filled 2023-05-26: qty 60, 30d supply, fill #1
  Filled 2023-06-22: qty 60, 30d supply, fill #2

## 2023-05-05 DIAGNOSIS — K080 Exfoliation of teeth due to systemic causes: Secondary | ICD-10-CM | POA: Diagnosis not present

## 2023-05-08 ENCOUNTER — Inpatient Hospital Stay: Payer: Medicare Other | Attending: Hematology and Oncology

## 2023-05-08 DIAGNOSIS — Z853 Personal history of malignant neoplasm of breast: Secondary | ICD-10-CM | POA: Insufficient documentation

## 2023-05-08 DIAGNOSIS — C9110 Chronic lymphocytic leukemia of B-cell type not having achieved remission: Secondary | ICD-10-CM | POA: Insufficient documentation

## 2023-05-08 LAB — CMP (CANCER CENTER ONLY)
ALT: 16 U/L (ref 0–44)
AST: 19 U/L (ref 15–41)
Albumin: 4.3 g/dL (ref 3.5–5.0)
Alkaline Phosphatase: 109 U/L (ref 38–126)
Anion gap: 5 (ref 5–15)
BUN: 16 mg/dL (ref 8–23)
CO2: 24 mmol/L (ref 22–32)
Calcium: 9 mg/dL (ref 8.9–10.3)
Chloride: 108 mmol/L (ref 98–111)
Creatinine: 0.82 mg/dL (ref 0.44–1.00)
GFR, Estimated: >60 mL/min (ref >60)
Glucose, Bld: 103 mg/dL — ABNORMAL HIGH (ref 70–99)
Potassium: 4 mmol/L (ref 3.5–5.1)
Sodium: 137 mmol/L (ref 135–145)
Total Bilirubin: 0.3 mg/dL (ref 0.3–1.2)
Total Protein: 6.3 g/dL — ABNORMAL LOW (ref 6.5–8.1)

## 2023-05-08 LAB — CBC WITH DIFFERENTIAL (CANCER CENTER ONLY)
Abs Immature Granulocytes: 0.03 10*3/uL (ref 0.00–0.07)
Basophils Absolute: 0.1 10*3/uL (ref 0.0–0.1)
Basophils Relative: 1 %
Eosinophils Absolute: 0.4 10*3/uL (ref 0.0–0.5)
Eosinophils Relative: 3 %
HCT: 33.7 % — ABNORMAL LOW (ref 36.0–46.0)
Hemoglobin: 11.1 g/dL — ABNORMAL LOW (ref 12.0–15.0)
Immature Granulocytes: 0 %
Lymphocytes Relative: 61 %
Lymphs Abs: 9 10*3/uL — ABNORMAL HIGH (ref 0.7–4.0)
MCH: 31 pg (ref 26.0–34.0)
MCHC: 32.9 g/dL (ref 30.0–36.0)
MCV: 94.1 fL (ref 80.0–100.0)
Monocytes Absolute: 0.8 10*3/uL (ref 0.1–1.0)
Monocytes Relative: 6 %
Neutro Abs: 4.2 10*3/uL (ref 1.7–7.7)
Neutrophils Relative %: 29 %
Platelet Count: 238 10*3/uL (ref 150–400)
RBC: 3.58 MIL/uL — ABNORMAL LOW (ref 3.87–5.11)
RDW: 15.5 % (ref 11.5–15.5)
Smear Review: NORMAL
WBC Count: 14.6 10*3/uL — ABNORMAL HIGH (ref 4.0–10.5)
nRBC: 0 % (ref 0.0–0.2)

## 2023-05-08 LAB — LACTATE DEHYDROGENASE: LDH: 140 U/L (ref 98–192)

## 2023-05-13 ENCOUNTER — Other Ambulatory Visit: Payer: Self-pay | Admitting: Hematology and Oncology

## 2023-05-13 DIAGNOSIS — C50011 Malignant neoplasm of nipple and areola, right female breast: Secondary | ICD-10-CM

## 2023-05-13 DIAGNOSIS — C9110 Chronic lymphocytic leukemia of B-cell type not having achieved remission: Secondary | ICD-10-CM

## 2023-05-14 ENCOUNTER — Other Ambulatory Visit (HOSPITAL_COMMUNITY): Payer: Self-pay

## 2023-05-20 DIAGNOSIS — M5414 Radiculopathy, thoracic region: Secondary | ICD-10-CM | POA: Diagnosis not present

## 2023-05-26 ENCOUNTER — Other Ambulatory Visit: Payer: Self-pay

## 2023-05-26 NOTE — Progress Notes (Signed)
Specialty Pharmacy Refill Coordination Note  [name] [name] is a 77 y.o. female contacted today regarding refills of specialty medication(s) Acalabrutinib Maleate .  Patient requested Delivery  on 05/29/23  to verified address [address]  [place] [address]   Medication will be filled on 05/28/23.

## 2023-05-28 ENCOUNTER — Encounter: Payer: Self-pay | Admitting: Internal Medicine

## 2023-05-28 DIAGNOSIS — R7989 Other specified abnormal findings of blood chemistry: Secondary | ICD-10-CM | POA: Diagnosis not present

## 2023-05-28 DIAGNOSIS — Z0000 Encounter for general adult medical examination without abnormal findings: Secondary | ICD-10-CM | POA: Diagnosis not present

## 2023-05-28 DIAGNOSIS — G629 Polyneuropathy, unspecified: Secondary | ICD-10-CM | POA: Diagnosis not present

## 2023-05-28 DIAGNOSIS — C9110 Chronic lymphocytic leukemia of B-cell type not having achieved remission: Secondary | ICD-10-CM | POA: Diagnosis not present

## 2023-05-28 DIAGNOSIS — J479 Bronchiectasis, uncomplicated: Secondary | ICD-10-CM | POA: Diagnosis not present

## 2023-05-28 DIAGNOSIS — F339 Major depressive disorder, recurrent, unspecified: Secondary | ICD-10-CM | POA: Diagnosis not present

## 2023-05-28 DIAGNOSIS — Z23 Encounter for immunization: Secondary | ICD-10-CM | POA: Diagnosis not present

## 2023-05-30 ENCOUNTER — Telehealth: Payer: Self-pay | Admitting: Hematology and Oncology

## 2023-06-02 ENCOUNTER — Encounter: Payer: Self-pay | Admitting: Pulmonary Disease

## 2023-06-02 ENCOUNTER — Telehealth: Payer: Self-pay | Admitting: Hematology and Oncology

## 2023-06-02 ENCOUNTER — Ambulatory Visit: Payer: Medicare Other | Admitting: Pulmonary Disease

## 2023-06-02 VITALS — BP 120/64 | HR 94 | Temp 98.2°F | Ht 63.0 in | Wt 188.2 lb

## 2023-06-02 DIAGNOSIS — R059 Cough, unspecified: Secondary | ICD-10-CM | POA: Diagnosis not present

## 2023-06-02 NOTE — Progress Notes (Signed)
[name] [name]    [id number]    1946/08/13  Primary Care Physician:Pharr, Zollie Beckers, MD  Referring Physician: Merri Brunette, MD [address] [place],  [place] [address]  Chief complaint: Follow up for post COVID-54  HPI: 77 year old with history of CLL, GERD Developed COVID-19 at the end of January 2022.  Treated with monoclonal antibody Had symptoms of cough and dyspnea which have been gradually getting better and back to baseline She had a high-resolution CT with no interstitial lung disease and normal PFTs  Pets: Cat Occupation: Homemaker Exposures: No known exposures.  No mold, hot tub, Jacuzzi Smoking history: Never smoker Travel history: No significant travel history Relevant family history: No family issue of lung disease.  Interim history: Seen back in clinic after gap of 2 years She has developed a chronic cough which is nonproductive in nature Has symptoms of acid reflux for which she is on Prilosec Has occasional sinus congestion  She started taking [place] herbal supplement over-the-counter which has improved the cough  Outpatient Encounter Medications as of 06/02/2023  Medication Sig   acalabrutinib maleate (CALQUENCE) 100 MG tablet Take 1 tablet (100 mg total) by mouth 2 (two) times daily.   allopurinol (ZYLOPRIM) 300 MG tablet TAKE 1 TABLET(300 MG) BY MOUTH DAILY (Patient taking differently: Take 300 mg by mouth daily.)   amitriptyline (ELAVIL) 100 MG tablet Take 100 mg by mouth at bedtime.   aspirin 81 MG EC tablet Take 81 mg by mouth daily.   brexpiprazole (REXULTI) 1 MG TABS tablet Take 1 mg by mouth daily.   buPROPion (WELLBUTRIN XL) 300 MG 24 hr tablet Take 300 mg by mouth daily.   Cholecalciferol (VITAMIN D3) 50 MCG (2000 UT) TABS Take 2,000 Units by mouth daily.   COLLAGEN PO Take 4 tablets by mouth daily. Vital protein gummy   Cyanocobalamin (VITAMIN B12) 1000 MCG TBCR Take 1,000 Units by mouth daily.   cyclobenzaprine  (FLEXERIL) 10 MG tablet Take 1 tablet (10 mg total) by mouth 3 (three) times daily as needed for muscle spasms.   diclofenac Sodium (VOLTAREN) 1 % GEL Apply 2 g topically 4 (four) times daily.   ferrous sulfate 325 (65 FE) MG tablet Take 325 mg by mouth daily with breakfast.   fluvoxaMINE (LUVOX) 50 MG tablet Take 100 mg by mouth at bedtime.   gabapentin (NEURONTIN) 100 MG capsule Take 200 mg by mouth 2 (two) times daily.   ibuprofen (ADVIL) 200 MG tablet Take 600 mg by mouth in the morning.   levothyroxine (SYNTHROID, LEVOTHROID) 88 MCG tablet Take 88 mcg by mouth daily before breakfast.   Multiple Vitamins-Minerals (WOMENS 50+ MULTI VITAMIN/MIN) TABS Take 1 tablet by mouth daily.   omeprazole (PRILOSEC) 40 MG capsule TAKE 1 CAPSULE(40 MG) BY MOUTH DAILY AFTER BREAKFAST   oxyCODONE 10 MG TABS Take 1 tablet (10 mg total) by mouth every 3 (three) hours as needed for severe pain ((score 7 to 10)).   rosuvastatin (CRESTOR) 5 MG tablet Take 1 tablet (5 mg total) by mouth daily.   zinc gluconate 50 MG tablet Take 50 mg by mouth daily.   No facility-administered encounter medications on file as of 06/02/2023.    Physical Exam: Blood pressure 112/64, pulse (!) 105, temperature (!) 97 F (36.1 C), temperature source Temporal, height 5\' 5"  (1.651 m), weight 188 lb (85.3 kg), last menstrual period 08/26/1995, SpO2 98 %. Gen:      No acute distress HEENT:  EOMI, sclera anicteric Neck:     No masses; no thyromegaly Lungs:    Clear to auscultation bilaterally; normal respiratory effort CV:         Regular rate and rhythm; no murmurs Abd:      + bowel sounds; soft, non-tender; no palpable masses, no distension Ext:    No edema; adequate peripheral perfusion Skin:      Warm and dry; no rash Neuro: alert and oriented x 3 Psych: normal mood and affect  Data Reviewed: Imaging: Chest x-ray 10/10/2020-faint hazy upper lung opacities.  High-resolution CT 12/31/2020-mild tubular bronchiectasis, mild air  trapping, axillary and subpectoral lymph node.  No interstitial lung disease I have reviewed the images personally.  PFTs: 03/18/2021 FVC 2.88 (102%), FEV1 2.57 [122%], F/F 90, TLC 4.34 [105%], DLCO 20.13 [105%] Normal test  Labs: CBC 05/08/2023-WBC 14.6, eos 3%, absolute eosinophil count 438 IgE 11/08/2013-379  Assessment:  Chronic cough May be secondary to upper airway cough syndrome, GERD, possible reactive airway disease Symptoms appear to be improving with [place] herbal supplement Will get a high-res CT and PFTs for evaluation Reassess in 3 months.  If her cough continues in spite of herbal supplement then start treating upper airway cough syndrome, GERD and asthma.  Post COVID-19 She is recovered well back to baseline CT with no ILD.  She has minimal bronchiectasis which looks nonspecific.  PFTs are normal  Suspect dyspnea on exertion secondary to deconditioning and body habitus Advised weight loss with diet and exercise  Plan/Recommendations: High-res CT, PFTs  Chilton Greathouse MD  Pulmonary and Critical Care 06/02/2023, 3:56 PM  CC: Merri Brunette, MD

## 2023-06-02 NOTE — Patient Instructions (Signed)
Continue your herbal supplement Will order a CT and lung function test for evaluation of the lung Follow-up in 3 months

## 2023-06-03 ENCOUNTER — Telehealth: Payer: Self-pay | Admitting: Pulmonary Disease

## 2023-06-03 NOTE — Telephone Encounter (Signed)
Patient is returning phone call to schedule CT scan. Patient phone number is [phone].

## 2023-06-16 ENCOUNTER — Ambulatory Visit
Admission: RE | Admit: 2023-06-16 | Discharge: 2023-06-16 | Disposition: A | Discharge status: Home / Self Care | Payer: Medicare Other | Source: Ambulatory Visit | Attending: Pulmonary Disease | Admitting: Pulmonary Disease

## 2023-06-16 DIAGNOSIS — R918 Other nonspecific abnormal finding of lung field: Secondary | ICD-10-CM | POA: Diagnosis not present

## 2023-06-16 DIAGNOSIS — J479 Bronchiectasis, uncomplicated: Secondary | ICD-10-CM | POA: Diagnosis not present

## 2023-06-16 DIAGNOSIS — I700 Atherosclerosis of aorta: Secondary | ICD-10-CM | POA: Diagnosis not present

## 2023-06-16 DIAGNOSIS — Z1231 Encounter for screening mammogram for malignant neoplasm of breast: Secondary | ICD-10-CM | POA: Diagnosis not present

## 2023-06-16 DIAGNOSIS — R059 Cough, unspecified: Secondary | ICD-10-CM

## 2023-06-18 ENCOUNTER — Other Ambulatory Visit (HOSPITAL_COMMUNITY): Payer: Self-pay

## 2023-06-19 ENCOUNTER — Inpatient Hospital Stay: Payer: Medicare Other | Attending: Hematology and Oncology

## 2023-06-19 ENCOUNTER — Other Ambulatory Visit: Payer: Self-pay

## 2023-06-19 ENCOUNTER — Inpatient Hospital Stay (HOSPITAL_BASED_OUTPATIENT_CLINIC_OR_DEPARTMENT_OTHER): Payer: Medicare Other | Admitting: Hematology and Oncology

## 2023-06-19 VITALS — BP 139/65 | HR 92 | Temp 98.1°F | Resp 17 | Wt 187.9 lb

## 2023-06-19 DIAGNOSIS — Z923 Personal history of irradiation: Secondary | ICD-10-CM | POA: Insufficient documentation

## 2023-06-19 DIAGNOSIS — Z833 Family history of diabetes mellitus: Secondary | ICD-10-CM | POA: Diagnosis not present

## 2023-06-19 DIAGNOSIS — Z9011 Acquired absence of right breast and nipple: Secondary | ICD-10-CM | POA: Insufficient documentation

## 2023-06-19 DIAGNOSIS — Z170 Estrogen receptor positive status [ER+]: Secondary | ICD-10-CM

## 2023-06-19 DIAGNOSIS — Z8249 Family history of ischemic heart disease and other diseases of the circulatory system: Secondary | ICD-10-CM | POA: Insufficient documentation

## 2023-06-19 DIAGNOSIS — Z853 Personal history of malignant neoplasm of breast: Secondary | ICD-10-CM | POA: Insufficient documentation

## 2023-06-19 DIAGNOSIS — C9110 Chronic lymphocytic leukemia of B-cell type not having achieved remission: Secondary | ICD-10-CM | POA: Diagnosis not present

## 2023-06-19 DIAGNOSIS — Z800 Family history of malignant neoplasm of digestive organs: Secondary | ICD-10-CM | POA: Insufficient documentation

## 2023-06-19 DIAGNOSIS — G8929 Other chronic pain: Secondary | ICD-10-CM | POA: Insufficient documentation

## 2023-06-19 DIAGNOSIS — Z823 Family history of stroke: Secondary | ICD-10-CM | POA: Insufficient documentation

## 2023-06-19 DIAGNOSIS — Z808 Family history of malignant neoplasm of other organs or systems: Secondary | ICD-10-CM | POA: Insufficient documentation

## 2023-06-19 DIAGNOSIS — Z888 Allergy status to other drugs, medicaments and biological substances status: Secondary | ICD-10-CM | POA: Insufficient documentation

## 2023-06-19 DIAGNOSIS — C50511 Malignant neoplasm of lower-outer quadrant of right female breast: Secondary | ICD-10-CM | POA: Diagnosis not present

## 2023-06-19 DIAGNOSIS — M549 Dorsalgia, unspecified: Secondary | ICD-10-CM | POA: Diagnosis not present

## 2023-06-19 DIAGNOSIS — R5383 Other fatigue: Secondary | ICD-10-CM | POA: Insufficient documentation

## 2023-06-19 DIAGNOSIS — Z882 Allergy status to sulfonamides status: Secondary | ICD-10-CM | POA: Diagnosis not present

## 2023-06-19 LAB — CBC WITH DIFFERENTIAL (CANCER CENTER ONLY)
Abs Immature Granulocytes: 0.05 10*3/uL (ref 0.00–0.07)
Basophils Absolute: 0.1 10*3/uL (ref 0.0–0.1)
Basophils Relative: 1 %
Eosinophils Absolute: 0.5 10*3/uL (ref 0.0–0.5)
Eosinophils Relative: 4 %
HCT: 36.8 % (ref 36.0–46.0)
Hemoglobin: 11.6 g/dL — ABNORMAL LOW (ref 12.0–15.0)
Immature Granulocytes: 0 %
Lymphocytes Relative: 56 %
Lymphs Abs: 7.6 10*3/uL — ABNORMAL HIGH (ref 0.7–4.0)
MCH: 31 pg (ref 26.0–34.0)
MCHC: 31.5 g/dL (ref 30.0–36.0)
MCV: 98.4 fL (ref 80.0–100.0)
Monocytes Absolute: 0.6 10*3/uL (ref 0.1–1.0)
Monocytes Relative: 4 %
Neutro Abs: 4.8 10*3/uL (ref 1.7–7.7)
Neutrophils Relative %: 35 %
Platelet Count: 199 10*3/uL (ref 150–400)
RBC: 3.74 MIL/uL — ABNORMAL LOW (ref 3.87–5.11)
RDW: 15.5 % (ref 11.5–15.5)
Smear Review: NORMAL
WBC Count: 13.7 10*3/uL — ABNORMAL HIGH (ref 4.0–10.5)
nRBC: 0 % (ref 0.0–0.2)

## 2023-06-19 LAB — CMP (CANCER CENTER ONLY)
ALT: 24 U/L (ref 0–44)
AST: 24 U/L (ref 15–41)
Albumin: 4.4 g/dL (ref 3.5–5.0)
Alkaline Phosphatase: 110 U/L (ref 38–126)
Anion gap: 6 (ref 5–15)
BUN: 14 mg/dL (ref 8–23)
CO2: 25 mmol/L (ref 22–32)
Calcium: 9.5 mg/dL (ref 8.9–10.3)
Chloride: 109 mmol/L (ref 98–111)
Creatinine: 0.97 mg/dL (ref 0.44–1.00)
GFR, Estimated: >60 mL/min (ref >60)
Glucose, Bld: 119 mg/dL — ABNORMAL HIGH (ref 70–99)
Potassium: 4.3 mmol/L (ref 3.5–5.1)
Sodium: 140 mmol/L (ref 135–145)
Total Bilirubin: 0.5 mg/dL (ref 0.3–1.2)
Total Protein: 6.4 g/dL — ABNORMAL LOW (ref 6.5–8.1)

## 2023-06-19 LAB — LACTATE DEHYDROGENASE: LDH: 150 U/L (ref 98–192)

## 2023-06-19 NOTE — Progress Notes (Unsigned)
[facility] [phone]   [phone]) [phone]  PROGRESS NOTE  Patient Care Team: Merri Brunette, MD as PCP - General (Internal Medicine) Jake Bathe, MD as PCP - Cardiology (Cardiology) Rachael Fee, MD as Attending Physician (Gastroenterology) Jerene Bears, MD as Consulting Physician (Gynecology) Manus Rudd, MD as Consulting Physician (General Surgery) Ellis Savage, NP as Nurse Practitioner Samson Frederic, MD as Consulting Physician (Orthopedic Surgery) Ranee Gosselin, MD as Consulting Physician (Orthopedic Surgery) Elenora Fender, MD as Attending Physician (Radiology) Jeronimo Norma, MD as Referring Physician (Orthopedic Surgery) Jaci Standard, MD as Consulting Physician (Hematology and Oncology)  Hematological/Oncological History # CLL Rai Stage 1  06/04/2021: last visit with Dr. Darnelle Catalan. Detailed history of his care history noted below.  09/18/2021: WBC 43.3, Hgb 13.0, MCV 87.1, Plt 275 09/23/2021: establish care with Dr. Leonides Schanz. WBC 30.1, Hgb 12.3, MCV 87.8, Plt 241 03/11/2022: WBC 57.4, Hgb 11.2, MCV 88.3, Plt 181  04/29/2022: Started acalabrutinib 100 mg PO twice daily  Interval History:  [name] [name] 77 y.o. 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 The patient's last visit was on 03/27/2023. In the interim since the last visit, she has continued on Calquence therapy without difficulty  On exam today [name] [name] is accompanied by her husband. She reports her primary symptom remains fatigue.  She reports her energy is about a 5 out of 10 today.  She is not having any lightheadedness, dizziness, or shortness of breath.  She reports that she has been well without any "head pounding" or bleeding, bruising, or palpitations of the heart.  She reports that she does not have any runny nose, sore throat, or cough.  She notes that she has been eating well.  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  She reports she does have a occasional issue with back pain, but this is chronic.  She denies any fevers, chills, sweats, shortness of breath, chest pain, cough, headaches or dizziness. She has no other complaints. Rest of 10 point ROS is listed below.   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   DDD (degenerative disc disease)    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 ear    Depression    Diverticulosis    Elevated LFTs    Fibromyalgia 1978   GERD (gastroesophageal reflux disease)    Hematuria    History of syncope    episode 2008--  no recurrence since   HLD (hyperlipidemia)    Hypothyroidism    Kidney stones 2014   Plantar fasciitis    Ruptured disk    one in neck and two in back   S/P chemotherapy, time since greater than 12 weeks    Sinus tachycardia    mild resting   Skin cancer    nose   Stool incontinence    "at times recently"     SURGICAL HISTORY: Past Surgical History:  Procedure Laterality Date   ANTERIOR LATERAL LUMBAR FUSION 4 LEVELS Left 04/25/2016   Procedure:  LEFT LUMBAR ONE-TWO, LUMBAR TWO-THREE, LUMBAR THREE-FOUR, LUMBAR FOUR-FIVE ANTERIOR LATERAL LUMBAR FUSION;  Surgeon: Julio Sicks, MD;  Location: [facility];  Service: Neurosurgery;  Laterality: Left;   APPENDECTOMY  1997   APPLICATION OF ROBOTIC ASSISTANCE FOR SPINAL PROCEDURE N/A 04/06/2017   Procedure: APPLICATION OF ROBOTIC ASSISTANCE FOR SPINAL PROCEDURE;  Surgeon: Julio Sicks, MD;  Location: [facility] OR;  Service: Neurosurgery;  Laterality: N/A;   BREAST BIOPSY Right 2014   BREAST LUMPECTOMY Right 1997   BREAST LUMPECTOMY WITH  AXILLARY LYMPH NODE DISSECTION Right 1997   CATARACT EXTRACTION, BILATERAL  2019   Dr. Alben Spittle   CYSTOSCOPY WITH RETROGRADE PYELOGRAM, URETEROSCOPY AND STENT PLACEMENT Bilateral 06/17/2013   Procedure: CYSTOSCOPY WITH RETROGRADE PYELOGRAM, URETEROSCOPY AND LEFT DOUBLE  J STENT PLACEMENT RIGHT URETERAL HOLMIIUM LASER AND DOUBLE J STENT ;  Surgeon: Lindaann Slough, MD;  Location: [facility];  Service: Urology;  Laterality: Bilateral;   HOLMIUM LASER APPLICATION Bilateral 06/17/2013   Procedure: HOLMIUM LASER APPLICATION;  Surgeon: Lindaann Slough, MD;  Location: [facility];  Service: Urology;  Laterality: Bilateral;   LAMINECTOMY WITH POSTERIOR LATERAL ARTHRODESIS LEVEL 3 N/A 02/02/2023   Procedure: Thoracic six - Thoracic nine Posterior lateral fusion;  Surgeon: Julio Sicks, MD;  Location: [facility] OR;  Service: Neurosurgery;  Laterality: N/A;   LITHOTRIPSY Left 06/2013   LUMBAR EPIDURAL INJECTION     has had 7 injections   PORT-A-CATH REMOVAL Left 10/03/2014   Procedure: REMOVAL PORT-A-CATH;  Surgeon: Manus Rudd, MD;  Location: [facility];  Service: General;  Laterality: Left;   PORTACATH PLACEMENT Left 07/28/2013   Procedure: ATTEMPTED INSERTION PORT-A-CATH;  Surgeon: Wilmon Arms. Corliss Skains, MD;  Location: [facility] OR;  Service: General;  Laterality: Left;   POSTERIOR LUMBAR FUSION 4 LEVEL N/A 04/25/2016   Procedure: LUMBAR FIVE-SACRAL ONE POSTERIOR LUMBAR INTERBODY FUSION, THORACIC NINE-SACRAL ONE POSTERIOR LATERAL ARTHRODESIS WITH PEDICLE SCREWS;  Surgeon: Julio Sicks, MD;  Location: [facility];  Service: Neurosurgery;  Laterality: N/A;   RETINAL DETACHMENT SURGERY Right 2013   ROBOTIC ASSITED PARTIAL NEPHRECTOMY Right 02/05/2015   Procedure: ROBOTIC ASSITED PARTIAL NEPHRECTOMY;  Surgeon: Heloise Purpura, MD;  Location: [facility];  Service: Urology;  Laterality: Right;   SIMPLE MASTECTOMY WITH AXILLARY SENTINEL NODE BIOPSY Right 07/28/2013   Procedure: RIGHT TOTAL   MASTECTOMY;  Surgeon: Wilmon Arms. Corliss Skains, MD;  Location: [facility] OR;  Service: General;  Laterality: Right;   TONSILLECTOMY  AGE 70   TOTAL ABDOMINAL HYSTERECTOMY W/ BILATERAL SALPINGOOPHORECTOMY  1997    SOCIAL HISTORY: Social History   Socioeconomic History   Marital status: Married    Spouse name: Philip   Number of children: 1   Years of education: Not on file   Highest education level: Not on file  Occupational History   Occupation: homemaker  Tobacco Use   Smoking status: Never   Smokeless tobacco: Never  Vaping Use   Vaping status: Never Used  Substance and Sexual Activity   Alcohol use: No   Drug use: No   Sexual activity: Not Currently    Partners: Male    Birth control/protection: Surgical    Comment: TAH/BSO  Other Topics Concern   Not on file  Social History Narrative   Not on file   Social Determinants of Health   Financial Resource Strain: Not on file  Food Insecurity: No Food Insecurity (12/19/2022)   Hunger Vital Sign    Worried About Running Out of Food in the Last Year: Never true  Ran Out of Food in the Last Year: Never true  Transportation Needs: No Transportation Needs (12/19/2022)   PRAPARE - Administrator, Civil Service (Medical): No    Lack of Transportation (Non-Medical): No  Physical Activity: Not on file  Stress: Not on file  Social Connections: Unknown (03/10/2022)   Received from [facility]   Social Network    Social Network: Not on file  Intimate Partner Violence: Not At Risk (12/19/2022)   Humiliation, Afraid, Rape, and Kick questionnaire    Fear of Current or Ex-Partner: No    Emotionally Abused: No    Physically Abused: No    Sexually Abused: No    FAMILY HISTORY: Family History  Problem Relation Age of Onset   Heart disease Maternal Grandfather    Diabetes Maternal Grandfather    Colon cancer Maternal Aunt 10   Brain cancer Paternal Grandmother        dx in 89s   Dementia Mother    Diabetes  Mother    Osteoporosis Mother    Diabetes Maternal Aunt    Prostate cancer Father 58   Bipolar disorder Maternal Aunt    Stroke Maternal Aunt    Stomach cancer Paternal Uncle        dx in late 62s    ALLERGIES:  is allergic to furosemide, lithium, sulfa antibiotics, trazodone and nefazodone, and lyrica [pregabalin].  MEDICATIONS:  Current Outpatient Medications  Medication Sig Dispense Refill   acalabrutinib maleate (CALQUENCE) 100 MG tablet Take 1 tablet (100 mg total) by mouth 2 (two) times daily. 60 tablet 2   allopurinol (ZYLOPRIM) 300 MG tablet TAKE 1 TABLET(300 MG) BY MOUTH DAILY (Patient taking differently: Take 300 mg by mouth daily.) 90 tablet 4   amitriptyline (ELAVIL) 100 MG tablet Take 100 mg by mouth at bedtime.     aspirin 81 MG EC tablet Take 81 mg by mouth daily.     brexpiprazole (REXULTI) 1 MG TABS tablet Take 1 mg by mouth daily.     buPROPion (WELLBUTRIN XL) 300 MG 24 hr tablet Take 300 mg by mouth daily.     Cholecalciferol (VITAMIN D3) 50 MCG (2000 UT) TABS Take 2,000 Units by mouth daily.     COLLAGEN PO Take 4 tablets by mouth daily. Vital protein gummy     Cyanocobalamin (VITAMIN B12) 1000 MCG TBCR Take 1,000 Units by mouth daily.     cyclobenzaprine (FLEXERIL) 10 MG tablet Take 1 tablet (10 mg total) by mouth 3 (three) times daily as needed for muscle spasms. 30 tablet 0   diclofenac Sodium (VOLTAREN) 1 % GEL Apply 2 g topically 4 (four) times daily. 2 g 3   ferrous sulfate 325 (65 FE) MG tablet Take 325 mg by mouth daily with breakfast.     fluvoxaMINE (LUVOX) 50 MG tablet Take 100 mg by mouth at bedtime.     gabapentin (NEURONTIN) 100 MG capsule Take 200 mg by mouth 2 (two) times daily.     ibuprofen (ADVIL) 200 MG tablet Take 600 mg by mouth in the morning.     levothyroxine (SYNTHROID, LEVOTHROID) 88 MCG tablet Take 88 mcg by mouth daily before breakfast.  4   Multiple Vitamins-Minerals (WOMENS 50+ MULTI VITAMIN/MIN) TABS Take 1 tablet by mouth daily.      omeprazole (PRILOSEC) 40 MG capsule TAKE 1 CAPSULE(40 MG) BY MOUTH DAILY AFTER BREAKFAST 90 capsule 0   oxyCODONE 10 MG TABS Take 1 tablet (10 mg total) by  mouth every 3 (three) hours as needed for severe pain ((score 7 to 10)). 40 tablet 0   rosuvastatin (CRESTOR) 5 MG tablet Take 1 tablet (5 mg total) by mouth daily. 30 tablet 8   zinc gluconate 50 MG tablet Take 50 mg by mouth daily.     No current facility-administered medications for this visit.    REVIEW OF SYSTEMS:   Constitutional: ( - ) fevers, ( - )  chills , ( - ) night sweats Eyes: ( - ) blurriness of vision, ( - ) double vision, ( - ) watery eyes Ears, nose, mouth, throat, and face: ( - ) mucositis, ( - ) sore throat Respiratory: ( - ) cough, ( - ) dyspnea, ( - ) wheezes Cardiovascular: ( - ) palpitation, ( - ) chest discomfort, ( - ) lower extremity swelling Gastrointestinal:  ( - ) nausea, ( - ) heartburn, ( - ) change in bowel habits Skin: ( - ) abnormal skin rashes Lymphatics: ( - ) new lymphadenopathy, ( - ) easy bruising Neurological: ( - ) numbness, ( - ) tingling, ( - ) new weaknesses Behavioral/Psych: ( - ) mood change, ( - ) new changes  All other systems were reviewed with the patient and are negative.  PHYSICAL EXAMINATION:  Vitals:   06/19/23 1042  BP: 139/65  Pulse: 92  Resp: 17  Temp: 98.1 F (36.7 C)  SpO2: 99%   Filed Weights   06/19/23 1042  Weight: 187 lb 14.4 oz (85.2 kg)    GENERAL: well appearing elderly Caucasian female. alert, no distress and comfortable SKIN:  skin color, texture, turgor are normal, no rashes or significant lesions EYES: conjunctiva are pink and non-injected, sclera clear LUNGS: clear to auscultation and percussion with normal breathing effort HEART: regular rate & rhythm and no murmurs and no lower extremity edema Musculoskeletal: no cyanosis of digits and no clubbing  PSYCH: alert & oriented x 3, fluent speech NEURO: no focal motor/sensory deficits  LABORATORY  DATA:  I have reviewed the data as listed    Latest Ref Rng & Units 06/19/2023   10:33 AM 05/08/2023    2:33 PM 03/27/2023   10:59 AM  CBC  WBC 4.0 - 10.5 K/uL 13.7  14.6  13.6   Hemoglobin 12.0 - 15.0 g/dL 16.1  09.6  04.5   Hematocrit 36.0 - 46.0 % 36.8  33.7  34.6   Platelets 150 - 400 K/uL 199  238  196        Latest Ref Rng & Units 06/19/2023   10:33 AM 05/08/2023    2:33 PM 03/27/2023   10:59 AM  CMP  Glucose 70 - 99 mg/dL 409  811  914   BUN 8 - 23 mg/dL 14  16  20    Creatinine 0.44 - 1.00 mg/dL 7.82  9.56  2.13   Sodium 135 - 145 mmol/L 140  137  139   Potassium 3.5 - 5.1 mmol/L 4.3  4.0  4.2   Chloride 98 - 111 mmol/L 109  108  107   CO2 22 - 32 mmol/L 25  24  24    Calcium 8.9 - 10.3 mg/dL 9.5  9.0  9.0   Total Protein 6.5 - 8.1 g/dL 6.4  6.3  6.1   Total Bilirubin 0.3 - 1.2 mg/dL 0.5  0.3  0.4   Alkaline Phos 38 - 126 U/L 110  109  99   AST 15 - 41 U/L 24  19  20   ALT 0 - 44 U/L 24  16  19      RADIOGRAPHIC STUDIES: No results found.  ASSESSMENT & PLAN ARRIYANA DANKER is a 77 y.o. 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  History Adapted from Dr. Darrall Dears last note:   (1) status post right breast lumpectomy and axillary lymph node dissection in 1997 for a stage I breast cancer, treated with adjuvant radiation and tamoxifen for 5 years   (2) status post right breast lower outer quadrant biopsy 07/13/2013 for a clinical T1c N0, stage IA invasive ductal carcinoma, grade 2, estrogen receptor 100% positive, progesterone receptor 13% positive, with an MIB-1 of 33%, and HER-2 amplification by CISH with a HER2/CEP 17 ratio of 3.19, and an average HER-2 copy number per cell of 4.15   (3) status post right mastectomy 07/28/2013 for a pT1c pN0, stage IA invasive ductal carcinoma, grade 3, with close but negative margins. Prognostic panel was not repeated             (a) the patient met with Dr. Odis Luster  and has decided against reconstruction   (4) completed weekly paclitaxel x12 12/06/2913, with trastuzumab/ pertuzumab every 3 weeks; pertuzumab was held with third dose on 11/01/2013 due to diarrhea, tried a half dose on cycle 4 again with diarrhea developing   (5) trastuzumab (started 09/20/2013) continued for 1 year, last dose 09/21/2014;             (a) final echocardiogram 09/07/2014 showed an ejection fraction of 55-60%   (6) anastrozole started May 2015; completed April 2020             (a) bone density 12/15/2013 normal             (b) bone density 02/01/2016 at [place] was normal with a T score of -1.0     OTHER PROBLEMS: (a) History of chronic lymphoid leukemia diagnosed by flow cytometry 06/29/2013, the cells being CD5, CD20 and CD23 positive, CD10 negative.              (1) right renal mass resected on 02/05/15, consisting of atypical lymphoid proliferation composed of monotonous small lymphocytes              (2) Anemia with a normal MCV and normal ferritin-- B-12 and folate normal; stable             (3)  ibrutinib 280 milligrams daily started 03/26/2019, discontinued 06/03/2019 because of concerns regarding dosing             (4) rituximab weekly x8 started 06/15/2019 completed December 2020             (5) maintenance rituximab (Q 2 months) started 10/25/2019, continued through 08/03/2020  (6) 04/29/2022: Started acalabrutinib 100 mg PO twice daily   # CLL Rai Stage I --Due to rapid doubling time of white blood cell count from 28.3 on 10/07/2021 to 66.7 on 04/09/2022, recommendation was to initiate therapy with acalabrutinib 100 mg twice daily, started on 04/29/2022 --Last imaging of the abdomen in 2022 showed no evidence of splenomegaly Plan:  --Labs today show WBC 13.7, Hgb 11.6, MCV 98.4, Plt 199.  Creatinine and LFTs are normal.  --Continue on acalabrutinib at 100 mg twice daily. --RTC in 4 weeks for labs and 12 weeks for clinic visit   #Head throbbing-resolved.  --Patient  reports throbbing sensation without headaches. Likely secondary to acalabrutinib. After  temporarily holding medication, symptoms have improved.  --Advised that headaches respond well to caffeine and trying to drink a caffeinated beverage. --Monitor for now and advised to follow up if symptoms worsen.  # ER + Breast Cancer in Survivorship -- Continue yearly mammograms. --last mammogram on 06/09/2022, next due in October 2024.  --Currently in survivorship.  No orders of the defined types were placed in this encounter.  All questions were answered. The patient knows to call the clinic with any problems, questions or concerns.  I have spent a total of 30 minutes minutes of face-to-face and non-face-to-face time, preparing to see the patient, performing a medically appropriate examination, counseling and educating the patient, ordering tests/procedures, documenting clinical information in the electronic health record, and care coordination.   Ulysees Barns, MD Department of Hematology/Oncology [facility] Phone: [phone] Pager: [phone] Email: [email or web address]  06/21/2023 6:04 PM

## 2023-06-22 ENCOUNTER — Other Ambulatory Visit (HOSPITAL_COMMUNITY): Payer: Self-pay

## 2023-06-22 ENCOUNTER — Other Ambulatory Visit: Payer: Self-pay

## 2023-06-22 NOTE — Progress Notes (Signed)
Specialty Pharmacy Refill Coordination Note  [name] [name] is a 77 y.o. female contacted today regarding refills of specialty medication(s) Acalabrutinib Maleate   Patient requested Delivery   Delivery date: 06/29/23   Verified address: [address]   [name]   Medication will be filled on 06/26/23.

## 2023-06-22 NOTE — Progress Notes (Signed)
Specialty Pharmacy Ongoing Clinical Assessment Note  [name] [name] is a 77 y.o. female who is being followed by the specialty pharmacy service for RxSp Oncology   Patient's specialty medication(s) reviewed today: Acalabrutinib Maleate   Missed doses in the last 4 weeks: 0   Patient/Caregiver did not have any additional questions or concerns.   Therapeutic benefit summary: Patient is achieving benefit   Adverse events/side effects summary: No adverse events/side effects   Patient's therapy is appropriate to: Continue    Goals Addressed             This Visit's Progress    Achieve or maintain remission       Patient is on track. Patient will maintain adherence         Follow up:  6 months  Servando Snare Specialty Pharmacist

## 2023-06-25 ENCOUNTER — Encounter (HOSPITAL_BASED_OUTPATIENT_CLINIC_OR_DEPARTMENT_OTHER): Payer: Self-pay | Admitting: *Deleted

## 2023-06-25 ENCOUNTER — Ambulatory Visit: Payer: Medicare Other | Admitting: Hematology and Oncology

## 2023-06-25 ENCOUNTER — Other Ambulatory Visit: Payer: Medicare Other

## 2023-06-30 DIAGNOSIS — F3341 Major depressive disorder, recurrent, in partial remission: Secondary | ICD-10-CM | POA: Diagnosis not present

## 2023-07-01 DIAGNOSIS — R635 Abnormal weight gain: Secondary | ICD-10-CM | POA: Diagnosis not present

## 2023-07-01 DIAGNOSIS — K219 Gastro-esophageal reflux disease without esophagitis: Secondary | ICD-10-CM | POA: Diagnosis not present

## 2023-07-02 ENCOUNTER — Ambulatory Visit: Payer: Medicare Other | Admitting: Hematology and Oncology

## 2023-07-02 ENCOUNTER — Other Ambulatory Visit: Payer: Medicare Other

## 2023-07-14 ENCOUNTER — Inpatient Hospital Stay: Payer: Medicare Other | Attending: Hematology and Oncology

## 2023-07-14 DIAGNOSIS — Z823 Family history of stroke: Secondary | ICD-10-CM | POA: Diagnosis not present

## 2023-07-14 DIAGNOSIS — Z8249 Family history of ischemic heart disease and other diseases of the circulatory system: Secondary | ICD-10-CM | POA: Insufficient documentation

## 2023-07-14 DIAGNOSIS — G8929 Other chronic pain: Secondary | ICD-10-CM | POA: Insufficient documentation

## 2023-07-14 DIAGNOSIS — Z853 Personal history of malignant neoplasm of breast: Secondary | ICD-10-CM | POA: Insufficient documentation

## 2023-07-14 DIAGNOSIS — Z888 Allergy status to other drugs, medicaments and biological substances status: Secondary | ICD-10-CM | POA: Diagnosis not present

## 2023-07-14 DIAGNOSIS — Z833 Family history of diabetes mellitus: Secondary | ICD-10-CM | POA: Insufficient documentation

## 2023-07-14 DIAGNOSIS — Z800 Family history of malignant neoplasm of digestive organs: Secondary | ICD-10-CM | POA: Insufficient documentation

## 2023-07-14 DIAGNOSIS — Z882 Allergy status to sulfonamides status: Secondary | ICD-10-CM | POA: Diagnosis not present

## 2023-07-14 DIAGNOSIS — M549 Dorsalgia, unspecified: Secondary | ICD-10-CM | POA: Diagnosis not present

## 2023-07-14 DIAGNOSIS — Z808 Family history of malignant neoplasm of other organs or systems: Secondary | ICD-10-CM | POA: Diagnosis not present

## 2023-07-14 DIAGNOSIS — C9110 Chronic lymphocytic leukemia of B-cell type not having achieved remission: Secondary | ICD-10-CM | POA: Insufficient documentation

## 2023-07-14 DIAGNOSIS — R5383 Other fatigue: Secondary | ICD-10-CM | POA: Insufficient documentation

## 2023-07-14 DIAGNOSIS — Z923 Personal history of irradiation: Secondary | ICD-10-CM | POA: Insufficient documentation

## 2023-07-14 DIAGNOSIS — Z9011 Acquired absence of right breast and nipple: Secondary | ICD-10-CM | POA: Diagnosis not present

## 2023-07-14 LAB — CBC WITH DIFFERENTIAL (CANCER CENTER ONLY)
Abs Immature Granulocytes: 0.06 10*3/uL (ref 0.00–0.07)
Basophils Absolute: 0.1 10*3/uL (ref 0.0–0.1)
Basophils Relative: 1 %
Eosinophils Absolute: 0.4 10*3/uL (ref 0.0–0.5)
Eosinophils Relative: 2 %
HCT: 34.6 % — ABNORMAL LOW (ref 36.0–46.0)
Hemoglobin: 11 g/dL — ABNORMAL LOW (ref 12.0–15.0)
Immature Granulocytes: 0 %
Lymphocytes Relative: 51 %
Lymphs Abs: 8.2 10*3/uL — ABNORMAL HIGH (ref 0.7–4.0)
MCH: 31.5 pg (ref 26.0–34.0)
MCHC: 31.8 g/dL (ref 30.0–36.0)
MCV: 99.1 fL (ref 80.0–100.0)
Monocytes Absolute: 0.9 10*3/uL (ref 0.1–1.0)
Monocytes Relative: 5 %
Neutro Abs: 6.6 10*3/uL (ref 1.7–7.7)
Neutrophils Relative %: 41 %
Platelet Count: 203 10*3/uL (ref 150–400)
RBC: 3.49 MIL/uL — ABNORMAL LOW (ref 3.87–5.11)
RDW: 14.1 % (ref 11.5–15.5)
Smear Review: NORMAL
WBC Count: 16.2 10*3/uL — ABNORMAL HIGH (ref 4.0–10.5)
nRBC: 0 % (ref 0.0–0.2)

## 2023-07-14 LAB — CMP (CANCER CENTER ONLY)
ALT: 25 U/L (ref 0–44)
AST: 24 U/L (ref 15–41)
Albumin: 4.3 g/dL (ref 3.5–5.0)
Alkaline Phosphatase: 107 U/L (ref 38–126)
Anion gap: 5 (ref 5–15)
BUN: 19 mg/dL (ref 8–23)
CO2: 25 mmol/L (ref 22–32)
Calcium: 9.5 mg/dL (ref 8.9–10.3)
Chloride: 105 mmol/L (ref 98–111)
Creatinine: 0.93 mg/dL (ref 0.44–1.00)
GFR, Estimated: >60 mL/min (ref >60)
Glucose, Bld: 92 mg/dL (ref 70–99)
Potassium: 4.7 mmol/L (ref 3.5–5.1)
Sodium: 135 mmol/L (ref 135–145)
Total Bilirubin: 0.4 mg/dL (ref <1.2)
Total Protein: 6.2 g/dL — ABNORMAL LOW (ref 6.5–8.1)

## 2023-07-14 LAB — LACTATE DEHYDROGENASE: LDH: 156 U/L (ref 98–192)

## 2023-07-16 ENCOUNTER — Other Ambulatory Visit: Payer: Self-pay

## 2023-07-16 NOTE — Telephone Encounter (Signed)
Telephone call  

## 2023-07-20 ENCOUNTER — Other Ambulatory Visit: Payer: Self-pay

## 2023-07-20 ENCOUNTER — Other Ambulatory Visit (HOSPITAL_COMMUNITY): Payer: Self-pay

## 2023-07-20 ENCOUNTER — Other Ambulatory Visit: Payer: Self-pay | Admitting: Hematology and Oncology

## 2023-07-20 MED ORDER — CALQUENCE 100 MG PO TABS
100.0000 mg | ORAL_TABLET | Freq: Two times a day (BID) | ORAL | 2 refills | Status: DC
  Filled 2023-07-20: qty 60, 30d supply, fill #0
  Filled 2023-08-28: qty 60, 30d supply, fill #1
  Filled 2023-09-23: qty 60, 30d supply, fill #2

## 2023-07-20 NOTE — Progress Notes (Signed)
Specialty Pharmacy Refill Coordination Note  [name] [name] is a 77 y.o. female contacted today regarding refills of specialty medication(s) Acalabrutinib Maleate   Patient requested Delivery   Delivery date: 07/30/23   Verified address: [address] [place] [address]   Medication will be filled on 07/29/23. *Pending refill request*

## 2023-07-28 DIAGNOSIS — E6609 Other obesity due to excess calories: Secondary | ICD-10-CM | POA: Diagnosis not present

## 2023-07-28 DIAGNOSIS — E66811 Obesity, class 1: Secondary | ICD-10-CM | POA: Diagnosis not present

## 2023-07-28 DIAGNOSIS — Z6833 Body mass index (BMI) 33.0-33.9, adult: Secondary | ICD-10-CM | POA: Diagnosis not present

## 2023-07-29 ENCOUNTER — Other Ambulatory Visit: Payer: Self-pay

## 2023-08-03 ENCOUNTER — Other Ambulatory Visit: Payer: Self-pay | Admitting: *Deleted

## 2023-08-03 DIAGNOSIS — C9110 Chronic lymphocytic leukemia of B-cell type not having achieved remission: Secondary | ICD-10-CM

## 2023-08-04 ENCOUNTER — Inpatient Hospital Stay: Payer: Medicare Other | Attending: Hematology and Oncology

## 2023-08-04 DIAGNOSIS — C9110 Chronic lymphocytic leukemia of B-cell type not having achieved remission: Secondary | ICD-10-CM | POA: Insufficient documentation

## 2023-08-04 DIAGNOSIS — Z853 Personal history of malignant neoplasm of breast: Secondary | ICD-10-CM | POA: Diagnosis not present

## 2023-08-04 LAB — CBC WITH DIFFERENTIAL (CANCER CENTER ONLY)
Abs Immature Granulocytes: 0.06 10*3/uL (ref 0.00–0.07)
Basophils Absolute: 0.1 10*3/uL (ref 0.0–0.1)
Basophils Relative: 1 %
Eosinophils Absolute: 0.5 10*3/uL (ref 0.0–0.5)
Eosinophils Relative: 4 %
HCT: 34.3 % — ABNORMAL LOW (ref 36.0–46.0)
Hemoglobin: 10.7 g/dL — ABNORMAL LOW (ref 12.0–15.0)
Immature Granulocytes: 1 %
Lymphocytes Relative: 51 %
Lymphs Abs: 6.8 10*3/uL — ABNORMAL HIGH (ref 0.7–4.0)
MCH: 31.2 pg (ref 26.0–34.0)
MCHC: 31.2 g/dL (ref 30.0–36.0)
MCV: 100 fL (ref 80.0–100.0)
Monocytes Absolute: 0.7 10*3/uL (ref 0.1–1.0)
Monocytes Relative: 5 %
Neutro Abs: 4.9 10*3/uL (ref 1.7–7.7)
Neutrophils Relative %: 38 %
Platelet Count: 173 10*3/uL (ref 150–400)
RBC: 3.43 MIL/uL — ABNORMAL LOW (ref 3.87–5.11)
RDW: 14.1 % (ref 11.5–15.5)
Smear Review: NORMAL
WBC Count: 13 10*3/uL — ABNORMAL HIGH (ref 4.0–10.5)
nRBC: 0 % (ref 0.0–0.2)

## 2023-08-04 LAB — LACTATE DEHYDROGENASE: LDH: 162 U/L (ref 98–192)

## 2023-08-04 LAB — CMP (CANCER CENTER ONLY)
ALT: 28 U/L (ref 0–44)
AST: 25 U/L (ref 15–41)
Albumin: 4.2 g/dL (ref 3.5–5.0)
Alkaline Phosphatase: 104 U/L (ref 38–126)
Anion gap: 8 (ref 5–15)
BUN: 15 mg/dL (ref 8–23)
CO2: 22 mmol/L (ref 22–32)
Calcium: 9.2 mg/dL (ref 8.9–10.3)
Chloride: 111 mmol/L (ref 98–111)
Creatinine: 1.17 mg/dL — ABNORMAL HIGH (ref 0.44–1.00)
GFR, Estimated: 48 mL/min — ABNORMAL LOW (ref >60)
Glucose, Bld: 104 mg/dL — ABNORMAL HIGH (ref 70–99)
Potassium: 4.8 mmol/L (ref 3.5–5.1)
Sodium: 141 mmol/L (ref 135–145)
Total Bilirubin: 0.3 mg/dL (ref <1.2)
Total Protein: 6.2 g/dL — ABNORMAL LOW (ref 6.5–8.1)

## 2023-08-12 ENCOUNTER — Other Ambulatory Visit: Payer: Self-pay | Admitting: Physician Assistant

## 2023-08-12 DIAGNOSIS — C50011 Malignant neoplasm of nipple and areola, right female breast: Secondary | ICD-10-CM

## 2023-08-12 DIAGNOSIS — C9110 Chronic lymphocytic leukemia of B-cell type not having achieved remission: Secondary | ICD-10-CM

## 2023-08-18 ENCOUNTER — Other Ambulatory Visit: Payer: Self-pay

## 2023-08-21 ENCOUNTER — Other Ambulatory Visit: Payer: Self-pay

## 2023-08-24 ENCOUNTER — Other Ambulatory Visit (HOSPITAL_COMMUNITY): Payer: Self-pay

## 2023-08-28 ENCOUNTER — Other Ambulatory Visit: Payer: Self-pay

## 2023-08-28 NOTE — Progress Notes (Signed)
 Specialty Pharmacy Refill Coordination Note  [name] [name] is a 78 y.o. female contacted today regarding refills of specialty medication(s) Acalabrutinib  Maleate (Calquence )   Patient requested Delivery   Delivery date: 08/31/23   Verified address: [address]   [name] [address]   Medication will be filled on 08/28/23.

## 2023-09-09 ENCOUNTER — Ambulatory Visit: Payer: Medicare Other | Admitting: Pulmonary Disease

## 2023-09-09 ENCOUNTER — Encounter: Payer: Self-pay | Admitting: Pulmonary Disease

## 2023-09-09 VITALS — BP 110/58 | HR 98

## 2023-09-09 DIAGNOSIS — K080 Exfoliation of teeth due to systemic causes: Secondary | ICD-10-CM | POA: Diagnosis not present

## 2023-09-09 DIAGNOSIS — Z8616 Personal history of COVID-19: Secondary | ICD-10-CM

## 2023-09-09 DIAGNOSIS — K219 Gastro-esophageal reflux disease without esophagitis: Secondary | ICD-10-CM

## 2023-09-09 DIAGNOSIS — R053 Chronic cough: Secondary | ICD-10-CM | POA: Diagnosis not present

## 2023-09-09 DIAGNOSIS — R059 Cough, unspecified: Secondary | ICD-10-CM

## 2023-09-09 MED ORDER — BUDESONIDE-FORMOTEROL FUMARATE 160-4.5 MCG/ACT IN AERO: 2.0000 | INHALATION_SPRAY | Freq: Two times a day (BID) | RESPIRATORY_TRACT | 3 refills | Status: DC

## 2023-09-09 NOTE — Progress Notes (Signed)
 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  Treated with monoclonal antibody Had symptoms of cough and dyspnea which have been gradually getting better and back to baseline She had a high-resolution CT with no interstitial lung disease and normal PFTs  Pets: Cat Occupation: Homemaker Exposures: No known exposures.  No mold, hot tub, Jacuzzi Smoking history: Never smoker Travel history: No significant travel history Relevant family history: No family issue of lung disease.  Interim history: Discussed the use of AI scribe software for clinical note transcription with the patient, who gave verbal consent to proceed.  The patient presents with a persistent cough that is not improving. She describes it as a continuous cycle of coughing spells, sometimes followed by sneezing. She has tried home remedies, such as Winford Haus, without success. The patient has a history of bronchiectasis, which was identified on a recent CT scan. She also has a history of CLL and had COVID-19 in January. The patient reports that this type of cough seems to run in her family, with both her mother and grandmother having similar symptoms. She also has acid reflux, which is being treated with omeprazole , and she believes it is under good control. The patient has a history of multiple health issues, including cancer (five times), fibromyalgia, three back surgeries, and degenerative disc disease.    Outpatient Encounter Medications as of 09/09/2023  Medication Sig   acalabrutinib  maleate (CALQUENCE ) 100 MG tablet Take 1 tablet (100 mg total) by mouth 2 (two) times daily.   allopurinol  (ZYLOPRIM ) 300 MG tablet TAKE 1 TABLET(300 MG) BY  MOUTH DAILY (Patient taking differently: Take 300 mg by mouth daily.)   amitriptyline  (ELAVIL ) 100 MG tablet Take 100 mg by mouth at bedtime.   aspirin  81 MG EC tablet Take 81 mg by mouth daily.   brexpiprazole  (REXULTI ) 1 MG TABS tablet Take 1 mg by mouth daily.   buPROPion  (WELLBUTRIN  XL) 300 MG 24 hr tablet Take 300 mg by mouth daily.   Cholecalciferol  (VITAMIN D3) 50 MCG (2000 UT) TABS Take 2,000 Units by mouth daily.   COLLAGEN PO Take 4 tablets by mouth daily. Vital protein gummy   Cyanocobalamin  (VITAMIN B12) 1000 MCG TBCR Take 1,000 Units by mouth daily.   ferrous sulfate  325 (65 FE) MG tablet Take 325 mg by mouth daily with breakfast.   fluvoxaMINE  (LUVOX ) 50 MG tablet Take 100 mg by mouth at bedtime.   gabapentin  (NEURONTIN ) 100 MG capsule Take 200 mg by mouth 2 (two) times daily.   ibuprofen  (ADVIL ) 200 MG tablet Take 600 mg by mouth in the morning.   levothyroxine  (SYNTHROID , LEVOTHROID) 88 MCG tablet Take 88 mcg by mouth daily before breakfast.   Multiple Vitamins-Minerals (WOMENS 50+ MULTI VITAMIN/MIN) TABS Take 1 tablet by mouth daily.   omeprazole  (PRILOSEC) 40 MG capsule TAKE 1 CAPSULE(40 MG) BY MOUTH DAILY AFTER BREAKFAST   oxyCODONE  10 MG TABS Take 1 tablet (10 mg total) by mouth every 3 (three) hours as needed for severe pain ((score 7 to 10)).   rosuvastatin  (CRESTOR ) 5 MG tablet Take 1 tablet (5 mg total) by mouth daily.   [DISCONTINUED] cyclobenzaprine  (FLEXERIL ) 10 MG tablet Take 1 tablet (10  mg total) by mouth 3 (three) times daily as needed for muscle spasms.   [DISCONTINUED] diclofenac  Sodium (VOLTAREN ) 1 % GEL Apply 2 g topically 4 (four) times daily.   [DISCONTINUED] zinc  gluconate 50 MG tablet Take 50 mg by mouth daily.   No facility-administered encounter medications on file as of 09/09/2023.    Physical Exam: Blood pressure (!) 110/58, pulse 98, last menstrual period 08/26/1995, SpO2 98%. Gen:      No acute distress HEENT:  EOMI, sclera anicteric Neck:      No masses; no thyromegaly Lungs:    Clear to auscultation bilaterally; normal respiratory effort CV:         Regular rate and rhythm; no murmurs Abd:      + bowel sounds; soft, non-tender; no palpable masses, no distension Ext:    No edema; adequate peripheral perfusion Skin:      Warm and dry; no rash Neuro: alert and oriented x 3 Psych: normal mood and affect   Data Reviewed: Imaging: Chest x-ray 10/10/2020-faint hazy upper lung opacities.  High-resolution CT 12/31/2020-mild tubular bronchiectasis, mild air trapping, axillary and subpectoral lymph node.  No interstitial lung disease  High resolution CT 11/8/202- bronchiectasis, air trapping, minimal patchy groundglass I have reviewed the images personally.  PFTs: 03/18/2021 FVC 2.88 (102%), FEV1 2.57 [122%], F/F 90, TLC 4.34 [105%], DLCO 20.13 [105%] Normal test  Labs: CBC 05/08/2023-WBC 14.6, eos 3%, absolute eosinophil count 438 IgE 11/08/2013-379  Assessment:  Chronic Cough Persistent despite home remedies. CT scan showed mild bronchiectasis. Labs showed elevated eosinophils and IgE, suggesting possible allergic asthma. No postnasal drip or sinus congestion. Acid reflux is being treated with omeprazole . -Start Symbicort  inhaler twice daily. -Schedule lung function test in 6 months.  Gastroesophageal Reflux Disease (GERD) Currently on omeprazole . No new symptoms reported. -Continue omeprazole  as prescribed.  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  She does have minimal groundglass opacities and bronchiectasis.  PFTs in the past are normal Suspect dyspnea on exertion secondary to deconditioning and body habitus Advised weight loss with diet and exercise  Follow-up in 6 months for lung function test and to assess response to Symbicort .   Plan/Recommendations: Start Symbicort , PFTs and follow-up in 6 months  Phyllis Breeze MD [facility] 09/09/2023, 2:30 PM  CC: Imelda Man, MD

## 2023-09-09 NOTE — Patient Instructions (Signed)
 VISIT SUMMARY:  During today's visit, we discussed your persistent cough and reviewed your recent CT scan and lab results. We also reviewed your current treatment for acid reflux.  YOUR PLAN:  -CHRONIC COUGH: Your persistent cough may be related to mild bronchiectasis, a condition where the airways in your lungs are slightly damaged, making it harder to clear mucus. Your lab results also suggest possible allergic asthma. We are starting you on a Symbicort  inhaler, which you should use twice daily. We will schedule a lung function test in 6 months to monitor your progress.  -GASTROESOPHAGEAL REFLUX DISEASE (GERD): GERD is a condition where stomach acid frequently flows back into the tube connecting your mouth and stomach. You are currently managing this with omeprazole , and no new symptoms have been reported. Please continue taking omeprazole  as prescribed.  INSTRUCTIONS:  Please follow up in 6 months for a lung function test and to assess your response to the Symbicort  inhaler.

## 2023-09-14 ENCOUNTER — Other Ambulatory Visit (HOSPITAL_COMMUNITY): Payer: Self-pay

## 2023-09-14 ENCOUNTER — Telehealth: Payer: Self-pay | Admitting: Pharmacy Technician

## 2023-09-14 NOTE — Telephone Encounter (Signed)
Oral Oncology Patient Advocate Encounter   Was successful in securing patient an $2,000 grant from [employer] [facility]) to provide copayment coverage for Calquence.  This will keep the out of pocket expense at $0.     I have spoken with the patient.    The billing information is as follows and has been shared with [facility].   Member ID: [id number] Group ID: [id number] RxBin: [id number] Dates of Eligibility: 09/24/23 through 09/22/24  Fund:  CLL  Jinger Neighbors, CPhT-Adv Oncology Pharmacy Patient Advocate [facility] Direct Number: [phone]  Fax: [phone]

## 2023-09-16 ENCOUNTER — Inpatient Hospital Stay: Payer: Medicare Other

## 2023-09-16 ENCOUNTER — Telehealth: Payer: Self-pay | Admitting: *Deleted

## 2023-09-16 ENCOUNTER — Telehealth: Payer: Self-pay | Admitting: Hematology and Oncology

## 2023-09-16 ENCOUNTER — Inpatient Hospital Stay: Payer: Medicare Other | Admitting: Hematology and Oncology

## 2023-09-16 NOTE — Telephone Encounter (Signed)
Received call from pt. She states she needs to cancel her appts for today as she woke up with a terrible cough and feels bad. Advised to see her PCP/Urgent care. Advised that we will reschedule and that we appreciated her call today.  Scheduling message sent.

## 2023-09-16 NOTE — Progress Notes (Signed)
[facility] [phone]   [phone]  PROGRESS NOTE  Patient Care Team: Merri Brunette, MD as PCP - General (Internal Medicine) Jake Bathe, MD as PCP - Cardiology (Cardiology) Rachael Fee, MD (Inactive) as Attending Physician (Gastroenterology) Jerene Bears, MD as Consulting Physician (Gynecology) Manus Rudd, MD as Consulting Physician (General Surgery) Ellis Savage, NP as Nurse Practitioner Samson Frederic, MD as Consulting Physician (Orthopedic Surgery) Ranee Gosselin, MD as Consulting Physician (Orthopedic Surgery) Elenora Fender, MD as Attending Physician (Radiology) Jeronimo Norma, MD as Referring Physician (Orthopedic Surgery) Jaci Standard, MD as Consulting Physician (Hematology and Oncology)  Hematological/Oncological History # CLL Rai Stage 1  06/04/2021: last visit with Dr. Darnelle Catalan. Detailed history of his care history noted below.  09/18/2021: WBC 43.3, Hgb 13.0, MCV 87.1, Plt 275 09/23/2021: establish care with Dr. Leonides Schanz. WBC 30.1, Hgb 12.3, MCV 87.8, Plt 241 03/11/2022: WBC 57.4, Hgb 11.2, MCV 88.3, Plt 181  04/29/2022: Started acalabrutinib 100 mg PO twice daily  Interval History:  [name] [name] 78 y.o. 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 The patient's last visit was on 06/19/2023. In the interim since the last visit, she has continued on Calquence therapy without difficulty  On exam today [name] [name] is accompanied by her husband. She reports ***  She denies any fevers, chills, sweats, shortness of breath, chest pain, cough, headaches or dizziness. She has no other complaints. Rest of 10 point ROS is listed below.   MEDICAL HISTORY:  Past Medical History:  Diagnosis Date   Alopecia    Anxiety    Arthritis    "spine" (07/28/2013)   Breast cancer (HCC) 1997; 2014   right   CAP (community acquired pneumonia)    admission  06-04-2013, failed outpatient   Chronic back pain    "lower back and upper neck" (07/28/2013)   CLL (chronic lymphocytic leukemia) (HCC) 05/2013   DDD (degenerative disc disease)    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 ear    Depression    Diverticulosis    Elevated LFTs    Fibromyalgia 1978   GERD (gastroesophageal reflux disease)    Hematuria    History of syncope    episode 2008--  no recurrence since   HLD (hyperlipidemia)    Hypothyroidism    Kidney stones 2014   Plantar fasciitis    Ruptured disk    one in neck and two in back   S/P chemotherapy, time since greater than 12 weeks    Sinus tachycardia    mild resting   Skin cancer    nose   Stool incontinence    "at times recently"     SURGICAL HISTORY: Past Surgical History:  Procedure Laterality Date   ANTERIOR LATERAL LUMBAR FUSION 4 LEVELS Left 04/25/2016   Procedure: LEFT LUMBAR ONE-TWO, LUMBAR TWO-THREE, LUMBAR THREE-FOUR, LUMBAR FOUR-FIVE ANTERIOR LATERAL LUMBAR FUSION;  Surgeon: Julio Sicks, MD;  Location: [facility];  Service: Neurosurgery;  Laterality: Left;   APPENDECTOMY  1997   APPLICATION OF ROBOTIC ASSISTANCE FOR SPINAL PROCEDURE N/A 04/06/2017   Procedure: APPLICATION OF ROBOTIC ASSISTANCE FOR SPINAL PROCEDURE;  Surgeon: Julio Sicks, MD;  Location: [facility] OR;  Service: Neurosurgery;  Laterality: N/A;   BREAST BIOPSY Right 2014   BREAST LUMPECTOMY Right 1997   BREAST LUMPECTOMY WITH AXILLARY LYMPH NODE DISSECTION Right 1997   CATARACT EXTRACTION, BILATERAL  2019   Dr. Alben Spittle  CYSTOSCOPY WITH RETROGRADE PYELOGRAM, URETEROSCOPY AND STENT PLACEMENT Bilateral 06/17/2013   Procedure: CYSTOSCOPY WITH RETROGRADE PYELOGRAM, URETEROSCOPY AND LEFT DOUBLE  J STENT PLACEMENT RIGHT URETERAL HOLMIIUM LASER AND DOUBLE J STENT ;  Surgeon: Lindaann Slough, MD;  Location: [facility];  Service: Urology;  Laterality: Bilateral;   HOLMIUM LASER APPLICATION Bilateral 06/17/2013   Procedure: HOLMIUM LASER APPLICATION;  Surgeon:  Lindaann Slough, MD;  Location: [facility];  Service: Urology;  Laterality: Bilateral;   LAMINECTOMY WITH POSTERIOR LATERAL ARTHRODESIS LEVEL 3 N/A 02/02/2023   Procedure: Thoracic six - Thoracic nine Posterior lateral fusion;  Surgeon: Julio Sicks, MD;  Location[facility] OR;  Service: Neurosurgery;  Laterality: N/A;   LITHOTRIPSY Left 06/2013   LUMBAR EPIDURAL INJECTION     has had 7 injections   PORT-A-CATH REMOVAL Left 10/03/2014   Procedure: REMOVAL PORT-A-CATH;  Surgeon: Manus Rudd, MD;  Location: [facility];  Service: General;  Laterality: Left;   PORTACATH PLACEMENT Left 07/28/2013   Procedure: ATTEMPTED INSERTION PORT-A-CATH;  Surgeon: Wilmon Arms. Corliss Skains, MD;  Location: [facility] OR;  Service: General;  Laterality: Left;   POSTERIOR LUMBAR FUSION 4 LEVEL N/A 04/25/2016   Procedure: LUMBAR FIVE-SACRAL ONE POSTERIOR LUMBAR INTERBODY FUSION, THORACIC NINE-SACRAL ONE POSTERIOR LATERAL ARTHRODESIS WITH PEDICLE SCREWS;  Surgeon: Julio Sicks, MD;  Location: [facility];  Service: Neurosurgery;  Laterality: N/A;   RETINAL DETACHMENT SURGERY Right 2013   ROBOTIC ASSITED PARTIAL NEPHRECTOMY Right 02/05/2015   Procedure: ROBOTIC ASSITED PARTIAL NEPHRECTOMY;  Surgeon: Heloise Purpura, MD;  Location: [facility];  Service: Urology;  Laterality: Right;   SIMPLE MASTECTOMY WITH AXILLARY SENTINEL NODE BIOPSY Right 07/28/2013   Procedure: RIGHT TOTAL  MASTECTOMY;  Surgeon: Wilmon Arms. Corliss Skains, MD;  Location: [facility] OR;  Service: General;  Laterality: Right;   TONSILLECTOMY  AGE 14   TOTAL ABDOMINAL HYSTERECTOMY W/ BILATERAL SALPINGOOPHORECTOMY  1997    SOCIAL HISTORY: Social History   Socioeconomic History   Marital status: Married    Spouse name: Philip   Number of children: 1   Years of education: Not on file   Highest education level: Not on file  Occupational History   Occupation: homemaker  Tobacco Use   Smoking status: Never   Smokeless tobacco: Never  Vaping Use   Vaping status: Never  Used  Substance and Sexual Activity   Alcohol use: No   Drug use: No   Sexual activity: Not Currently    Partners: Male    Birth control/protection: Surgical    Comment: TAH/BSO  Other Topics Concern   Not on file  Social History Narrative   Not on file   Social Drivers of Health   Financial Resource Strain: Not on file  Food Insecurity: No Food Insecurity (12/19/2022)   Hunger Vital Sign    Worried About Running Out of Food in the Last Year: Never true    Ran Out of Food in the Last Year: Never true  Transportation Needs: No Transportation Needs (12/19/2022)   PRAPARE - Administrator, Civil Service (Medical): No    Lack of Transportation (Non-Medical): No  Physical Activity: Not on file  Stress: Not on file  Social Connections: Unknown (03/10/2022)   [facility] from [facility]   Social Network    Social Network: Not on file  Intimate Partner Violence: Not At Risk (12/19/2022)   Humiliation, Afraid, Rape, and Kick questionnaire    Fear of Current or Ex-Partner: No    Emotionally  Abused: No    Physically Abused: No    Sexually Abused: No    FAMILY HISTORY: Family History  Problem Relation Age of Onset   Heart disease Maternal Grandfather    Diabetes Maternal Grandfather    Colon cancer Maternal Aunt 9   Brain cancer Paternal Grandmother        dx in 87s   Dementia Mother    Diabetes Mother    Osteoporosis Mother    Diabetes Maternal Aunt    Prostate cancer Father 3   Bipolar disorder Maternal Aunt    Stroke Maternal Aunt    Stomach cancer Paternal Uncle        dx in late 21s    ALLERGIES:  is allergic to furosemide, lithium, sulfa antibiotics, trazodone and nefazodone, and lyrica [pregabalin].  MEDICATIONS:  Current Outpatient Medications  Medication Sig Dispense Refill   acalabrutinib maleate (CALQUENCE) 100 MG tablet Take 1 tablet (100 mg total) by mouth 2 (two) times daily. 60 tablet 2   allopurinol (ZYLOPRIM) 300 MG tablet  TAKE 1 TABLET(300 MG) BY MOUTH DAILY (Patient taking differently: Take 300 mg by mouth daily.) 90 tablet 4   amitriptyline (ELAVIL) 100 MG tablet Take 100 mg by mouth at bedtime.     aspirin 81 MG EC tablet Take 81 mg by mouth daily.     brexpiprazole (REXULTI) 1 MG TABS tablet Take 1 mg by mouth daily.     budesonide-formoterol (SYMBICORT) 160-4.5 MCG/ACT inhaler Inhale 2 puffs into the lungs 2 (two) times daily. 3 each 3   buPROPion (WELLBUTRIN XL) 300 MG 24 hr tablet Take 300 mg by mouth daily.     Cholecalciferol (VITAMIN D3) 50 MCG (2000 UT) TABS Take 2,000 Units by mouth daily.     COLLAGEN PO Take 4 tablets by mouth daily. Vital protein gummy     Cyanocobalamin (VITAMIN B12) 1000 MCG TBCR Take 1,000 Units by mouth daily.     ferrous sulfate 325 (65 FE) MG tablet Take 325 mg by mouth daily with breakfast.     fluvoxaMINE (LUVOX) 50 MG tablet Take 100 mg by mouth at bedtime.     gabapentin (NEURONTIN) 100 MG capsule Take 200 mg by mouth 2 (two) times daily.     ibuprofen (ADVIL) 200 MG tablet Take 600 mg by mouth in the morning.     levothyroxine (SYNTHROID, LEVOTHROID) 88 MCG tablet Take 88 mcg by mouth daily before breakfast.  4   Multiple Vitamins-Minerals (WOMENS 50+ MULTI VITAMIN/MIN) TABS Take 1 tablet by mouth daily.     omeprazole (PRILOSEC) 40 MG capsule TAKE 1 CAPSULE(40 MG) BY MOUTH DAILY AFTER BREAKFAST 90 capsule 0   oxyCODONE 10 MG TABS Take 1 tablet (10 mg total) by mouth every 3 (three) hours as needed for severe pain ((score 7 to 10)). 40 tablet 0   rosuvastatin (CRESTOR) 5 MG tablet Take 1 tablet (5 mg total) by mouth daily. 30 tablet 8   No current facility-administered medications for this visit.    REVIEW OF SYSTEMS:   Constitutional: ( - ) fevers, ( - )  chills , ( - ) night sweats Eyes: ( - ) blurriness of vision, ( - ) double vision, ( - ) watery eyes Ears, nose, mouth, throat, and face: ( - ) mucositis, ( - ) sore throat Respiratory: ( - ) cough, ( - ) dyspnea,  ( - ) wheezes Cardiovascular: ( - ) palpitation, ( - ) chest discomfort, ( - ) lower extremity  swelling Gastrointestinal:  ( - ) nausea, ( - ) heartburn, ( - ) change in bowel habits Skin: ( - ) abnormal skin rashes Lymphatics: ( - ) new lymphadenopathy, ( - ) easy bruising Neurological: ( - ) numbness, ( - ) tingling, ( - ) new weaknesses Behavioral/Psych: ( - ) mood change, ( - ) new changes  All other systems were reviewed with the patient and are negative.  PHYSICAL EXAMINATION:  There were no vitals filed for this visit.  There were no vitals filed for this visit.   GENERAL: well appearing elderly Caucasian female. alert, no distress and comfortable SKIN:  skin color, texture, turgor are normal, no rashes or significant lesions EYES: conjunctiva are pink and non-injected, sclera clear LUNGS: clear to auscultation and percussion with normal breathing effort HEART: regular rate & rhythm and no murmurs and no lower extremity edema Musculoskeletal: no cyanosis of digits and no clubbing  PSYCH: alert & oriented x 3, fluent speech NEURO: no focal motor/sensory deficits  LABORATORY DATA:  I have reviewed the data as listed    Latest Ref Rng & Units 08/04/2023    2:02 PM 07/14/2023    2:04 PM 06/19/2023   10:33 AM  CBC  WBC 4.0 - 10.5 K/uL 13.0  16.2  13.7   Hemoglobin 12.0 - 15.0 g/dL 36.1  44.3  15.4   Hematocrit 36.0 - 46.0 % 34.3  34.6  36.8   Platelets 150 - 400 K/uL 173  203  199        Latest Ref Rng & Units 08/04/2023    2:02 PM 07/14/2023    2:04 PM 06/19/2023   10:33 AM  CMP  Glucose 70 - 99 mg/dL 008  92  676   BUN 8 - 23 mg/dL 15  19  14    Creatinine 0.44 - 1.00 mg/dL 1.95  0.93  2.67   Sodium 135 - 145 mmol/L 141  135  140   Potassium 3.5 - 5.1 mmol/L 4.8  4.7  4.3   Chloride 98 - 111 mmol/L 111  105  109   CO2 22 - 32 mmol/L 22  25  25    Calcium 8.9 - 10.3 mg/dL 9.2  9.5  9.5   Total Protein 6.5 - 8.1 g/dL 6.2  6.2  6.4   Total Bilirubin <1.2 mg/dL 0.3   0.4  0.5   Alkaline Phos 38 - 126 U/L 104  107  110   AST 15 - 41 U/L 25  24  24    ALT 0 - 44 U/L 28  25  24      RADIOGRAPHIC STUDIES: No results found.  ASSESSMENT & PLAN [name] [name] is a 78 y.o. 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  History Adapted from Dr. Darrall Dears last note:   (1) status post right breast lumpectomy and axillary lymph node dissection in 1997 for a stage I breast cancer, treated with adjuvant radiation and tamoxifen for 5 years   (2) status post right breast lower outer quadrant biopsy 07/13/2013 for a clinical T1c N0, stage IA invasive ductal carcinoma, grade 2, estrogen receptor 100% positive, progesterone receptor 13% positive, with an MIB-1 of 33%, and HER-2 amplification by CISH with a HER2/CEP 17 ratio of 3.19, and an average HER-2 copy number per cell of 4.15   (3) status post right mastectomy 07/28/2013 for a pT1c pN0, stage IA invasive ductal carcinoma, grade  3, with close but negative margins. Prognostic panel was not repeated             (a) the patient met with Dr. Odis Luster and has decided against reconstruction   (4) completed weekly paclitaxel x12 12/06/2913, with trastuzumab/ pertuzumab every 3 weeks; pertuzumab was held with third dose on 11/01/2013 due to diarrhea, tried a half dose on cycle 4 again with diarrhea developing   (5) trastuzumab (started 09/20/2013) continued for 1 year, last dose 09/21/2014;             (a) final echocardiogram 09/07/2014 showed an ejection fraction of 55-60%   (6) anastrozole started May 2015; completed April 2020             (a) bone density 12/15/2013 normal             (b) bone density 02/01/2016 at [place] was normal with a T score of -1.0     OTHER PROBLEMS: (a) History of chronic lymphoid leukemia diagnosed by flow cytometry 06/29/2013, the cells being CD5, CD20 and CD23 positive, CD10 negative.              (1) right renal mass  resected on 02/05/15, consisting of atypical lymphoid proliferation composed of monotonous small lymphocytes              (2) Anemia with a normal MCV and normal ferritin-- B-12 and folate normal; stable             (3)  ibrutinib 280 milligrams daily started 03/26/2019, discontinued 06/03/2019 because of concerns regarding dosing             (4) rituximab weekly x8 started 06/15/2019 completed December 2020             (5) maintenance rituximab (Q 2 months) started 10/25/2019, continued through 08/03/2020  (6) 04/29/2022: Started acalabrutinib 100 mg PO twice daily   # CLL Rai Stage I --Due to rapid doubling time of white blood cell count from 28.3 on 10/07/2021 to 66.7 on 04/09/2022, recommendation was to initiate therapy with acalabrutinib 100 mg twice daily, started on 04/29/2022 --Last imaging of the abdomen in 2022 showed no evidence of splenomegaly Plan:  --Labs today show WBC ***.  Creatinine and LFTs are normal.  --Continue on acalabrutinib at 100 mg twice daily. --RTC in 4 weeks for labs and 12 weeks for clinic visit   #Head throbbing-resolved.  --Patient reports throbbing sensation without headaches. Likely secondary to acalabrutinib. After temporarily holding medication, symptoms have improved.  --Advised that headaches respond well to caffeine and trying to drink a caffeinated beverage. --Monitor for now and advised to follow up if symptoms worsen.  # ER + Breast Cancer in Survivorship -- Continue yearly mammograms. --last mammogram on 06/09/2022, next due in October 2024.  --Currently in survivorship.  No orders of the defined types were placed in this encounter.  All questions were answered. The patient knows to call the clinic with any problems, questions or concerns.  I have spent a total of 30 minutes minutes of face-to-face and non-face-to-face time, preparing to see the patient, performing a medically appropriate examination, counseling and educating the patient, ordering  tests/procedures, documenting clinical information in the electronic health record, and care coordination.   Ulysees Barns, MD Department of Hematology/Oncology [facility] Phone: [phone] Pager: [phone] Email: [email or web address]  09/16/2023 7:42 AM

## 2023-09-18 ENCOUNTER — Other Ambulatory Visit: Payer: Self-pay

## 2023-09-21 ENCOUNTER — Other Ambulatory Visit: Payer: Self-pay | Admitting: Cardiology

## 2023-09-21 ENCOUNTER — Encounter (HOSPITAL_COMMUNITY): Payer: Self-pay

## 2023-09-21 ENCOUNTER — Other Ambulatory Visit (HOSPITAL_COMMUNITY): Payer: Self-pay

## 2023-09-23 ENCOUNTER — Other Ambulatory Visit (HOSPITAL_COMMUNITY): Payer: Self-pay | Admitting: Pharmacy Technician

## 2023-09-23 ENCOUNTER — Other Ambulatory Visit (HOSPITAL_COMMUNITY): Payer: Self-pay

## 2023-09-23 NOTE — Progress Notes (Signed)
Specialty Pharmacy Refill Coordination Note  [name] [name] is a 78 y.o. female contacted today regarding refills of specialty medication(s) Acalabrutinib Maleate (Calquence)   Patient requested Delivery   Delivery date: 09/30/23   Verified address: Patient address [address]  [place]   Medication will be filled on 09/29/23.

## 2023-09-24 ENCOUNTER — Inpatient Hospital Stay: Payer: Medicare Other | Admitting: Hematology and Oncology

## 2023-09-24 ENCOUNTER — Inpatient Hospital Stay: Payer: Medicare Other | Attending: Hematology and Oncology

## 2023-09-24 VITALS — BP 120/67 | HR 100 | Temp 98.1°F | Resp 16 | Wt 194.4 lb

## 2023-09-24 DIAGNOSIS — Z7982 Long term (current) use of aspirin: Secondary | ICD-10-CM | POA: Diagnosis not present

## 2023-09-24 DIAGNOSIS — Z853 Personal history of malignant neoplasm of breast: Secondary | ICD-10-CM | POA: Insufficient documentation

## 2023-09-24 DIAGNOSIS — Z79899 Other long term (current) drug therapy: Secondary | ICD-10-CM | POA: Diagnosis not present

## 2023-09-24 DIAGNOSIS — C9110 Chronic lymphocytic leukemia of B-cell type not having achieved remission: Secondary | ICD-10-CM

## 2023-09-24 DIAGNOSIS — Z170 Estrogen receptor positive status [ER+]: Secondary | ICD-10-CM | POA: Diagnosis not present

## 2023-09-24 DIAGNOSIS — C50511 Malignant neoplasm of lower-outer quadrant of right female breast: Secondary | ICD-10-CM | POA: Diagnosis not present

## 2023-09-24 DIAGNOSIS — Z9011 Acquired absence of right breast and nipple: Secondary | ICD-10-CM | POA: Insufficient documentation

## 2023-09-24 LAB — CBC WITH DIFFERENTIAL (CANCER CENTER ONLY)
Abs Immature Granulocytes: 0.05 10*3/uL (ref 0.00–0.07)
Basophils Absolute: 0.1 10*3/uL (ref 0.0–0.1)
Basophils Relative: 1 %
Eosinophils Absolute: 0.4 10*3/uL (ref 0.0–0.5)
Eosinophils Relative: 3 %
HCT: 36.3 % (ref 36.0–46.0)
Hemoglobin: 11.6 g/dL — ABNORMAL LOW (ref 12.0–15.0)
Immature Granulocytes: 0 %
Lymphocytes Relative: 44 %
Lymphs Abs: 6 10*3/uL — ABNORMAL HIGH (ref 0.7–4.0)
MCH: 31.4 pg (ref 26.0–34.0)
MCHC: 32 g/dL (ref 30.0–36.0)
MCV: 98.1 fL (ref 80.0–100.0)
Monocytes Absolute: 0.8 10*3/uL (ref 0.1–1.0)
Monocytes Relative: 6 %
Neutro Abs: 6.2 10*3/uL (ref 1.7–7.7)
Neutrophils Relative %: 46 %
Platelet Count: 197 10*3/uL (ref 150–400)
RBC: 3.7 MIL/uL — ABNORMAL LOW (ref 3.87–5.11)
RDW: 13.9 % (ref 11.5–15.5)
Smear Review: NORMAL
WBC Count: 13.6 10*3/uL — ABNORMAL HIGH (ref 4.0–10.5)
nRBC: 0 % (ref 0.0–0.2)

## 2023-09-24 LAB — CMP (CANCER CENTER ONLY)
ALT: 37 U/L (ref 0–44)
AST: 29 U/L (ref 15–41)
Albumin: 4.4 g/dL (ref 3.5–5.0)
Alkaline Phosphatase: 111 U/L (ref 38–126)
Anion gap: 7 (ref 5–15)
BUN: 16 mg/dL (ref 8–23)
CO2: 23 mmol/L (ref 22–32)
Calcium: 9.3 mg/dL (ref 8.9–10.3)
Chloride: 104 mmol/L (ref 98–111)
Creatinine: 1.04 mg/dL — ABNORMAL HIGH (ref 0.44–1.00)
GFR, Estimated: 55 mL/min — ABNORMAL LOW (ref >60)
Glucose, Bld: 94 mg/dL (ref 70–99)
Potassium: 4.6 mmol/L (ref 3.5–5.1)
Sodium: 134 mmol/L — ABNORMAL LOW (ref 135–145)
Total Bilirubin: 0.4 mg/dL (ref 0.0–1.2)
Total Protein: 6.4 g/dL — ABNORMAL LOW (ref 6.5–8.1)

## 2023-09-24 LAB — LACTATE DEHYDROGENASE: LDH: 171 U/L (ref 98–192)

## 2023-09-24 NOTE — Progress Notes (Signed)
[facility] [phone]   [phone]) [phone]  PROGRESS NOTE  Patient Care Team: Merri Brunette, MD as PCP - General (Internal Medicine) Jake Bathe, MD as PCP - Cardiology (Cardiology) Rachael Fee, MD (Inactive) as Attending Physician (Gastroenterology) Jerene Bears, MD as Consulting Physician (Gynecology) Manus Rudd, MD as Consulting Physician (General Surgery) Ellis Savage, NP as Nurse Practitioner Samson Frederic, MD as Consulting Physician (Orthopedic Surgery) Ranee Gosselin, MD as Consulting Physician (Orthopedic Surgery) Elenora Fender, MD as Attending Physician (Radiology) Jeronimo Norma, MD as Referring Physician (Orthopedic Surgery) Jaci Standard, MD as Consulting Physician (Hematology and Oncology)  Hematological/Oncological History # CLL Rai Stage 1  06/04/2021: last visit with Dr. Darnelle Catalan. Detailed history of his care history noted below.  09/18/2021: WBC 43.3, Hgb 13.0, MCV 87.1, Plt 275 09/23/2021: establish care with Dr. Leonides Schanz. WBC 30.1, Hgb 12.3, MCV 87.8, Plt 241 03/11/2022: WBC 57.4, Hgb 11.2, MCV 88.3, Plt 181  04/29/2022: Started acalabrutinib 100 mg PO twice daily  Interval History:  [name] [name] 78 y.o. 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 The patient's last visit was on 06/19/2023. In the interim since the last visit, she has continued on Calquence therapy without difficulty  On exam today [name] [name] is accompanied by her husband. She reports she had a trip to [facility] in the interim since her last visit.  She reports that mostly she stayed in the room because her energy levels are poor.  She reports her energy is about a 6 out of 10 today.  Her appetite is strong but she does not feel like she is eating particularly well.  She takes her acalabrutinib as prescribed twice daily.  She is not having any major side effects as  result of the medication.  She is not having any nausea, vomiting, or diarrhea.  She reports that she has had a few falls recently which required x-rays of her knees.  She reports that she also injured her hip.  She is unsure why she is falling but denies any lightheadedness, dizziness, or shortness of breath.  She thinks the falls have been mechanical.  She has not had any bleeding, bruising, or dark stools.  She reports that she does have a chronic cough which is being evaluated by pulmonology.  She is thought to have bronchiectasis.  She notes that she recently started a weight loss medication but has not yet had any success.  She denies any fevers, chills, sweats, shortness of breath, chest pain, cough, headaches or dizziness. She has no other complaints. Rest of 10 point ROS is listed below.   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   DDD (degenerative disc disease)    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 ear    Depression    Diverticulosis    Elevated LFTs    Fibromyalgia 1978   GERD (gastroesophageal reflux disease)    Hematuria    History of syncope    episode 2008--  no recurrence since   HLD (hyperlipidemia)    Hypothyroidism  Kidney stones 2014   Plantar fasciitis    Ruptured disk    one in neck and two in back   S/P chemotherapy, time since greater than 12 weeks    Sinus tachycardia    mild resting   Skin cancer    nose   Stool incontinence    "at times recently"     SURGICAL HISTORY: Past Surgical History:  Procedure Laterality Date   ANTERIOR LATERAL LUMBAR FUSION 4 LEVELS Left 04/25/2016   Procedure: LEFT LUMBAR ONE-TWO, LUMBAR TWO-THREE, LUMBAR THREE-FOUR, LUMBAR  FOUR-FIVE ANTERIOR LATERAL LUMBAR FUSION;  Surgeon: Julio Sicks, MD;  Location: [facility];  Service: Neurosurgery;  Laterality: Left;   APPENDECTOMY  1997   APPLICATION OF ROBOTIC ASSISTANCE FOR SPINAL PROCEDURE N/A 04/06/2017   Procedure: APPLICATION OF ROBOTIC ASSISTANCE FOR SPINAL PROCEDURE;  Surgeon: Julio Sicks, MD;  Location: [facility];  Service: Neurosurgery;  Laterality: N/A;   BREAST BIOPSY Right 2014   BREAST LUMPECTOMY Right 1997   BREAST LUMPECTOMY WITH AXILLARY LYMPH NODE DISSECTION Right 1997   CATARACT EXTRACTION, BILATERAL  2019   Dr. Alben Spittle   CYSTOSCOPY WITH RETROGRADE PYELOGRAM, URETEROSCOPY AND STENT PLACEMENT Bilateral 06/17/2013   Procedure: CYSTOSCOPY WITH RETROGRADE PYELOGRAM, URETEROSCOPY AND LEFT DOUBLE  J STENT PLACEMENT RIGHT URETERAL HOLMIIUM LASER AND DOUBLE J STENT ;  Surgeon: Lindaann Slough, MD;  Location[facility];  Service: Urology;  Laterality: Bilateral;   HOLMIUM LASER APPLICATION Bilateral 06/17/2013   Procedure: HOLMIUM LASER APPLICATION;  Surgeon: Lindaann Slough, MD;  Location: [facility];  Service: Urology;  Laterality: Bilateral;   LAMINECTOMY WITH POSTERIOR LATERAL ARTHRODESIS LEVEL 3 N/A 02/02/2023   Procedure: Thoracic six - Thoracic nine Posterior lateral fusion;  Surgeon: Julio Sicks, MD;  Location: [facility] OR;  Service: Neurosurgery;  Laterality: N/A;   LITHOTRIPSY Left 06/2013   LUMBAR EPIDURAL INJECTION     has had 7 injections   PORT-A-CATH REMOVAL Left 10/03/2014   Procedure: REMOVAL PORT-A-CATH;  Surgeon: Manus Rudd, MD;  Location: [facility];  Service: General;  Laterality: Left;   PORTACATH PLACEMENT Left 07/28/2013   Procedure: ATTEMPTED INSERTION PORT-A-CATH;  Surgeon: Wilmon Arms. Corliss Skains, MD;  Location: [facility];  Service: General;  Laterality: Left;   POSTERIOR LUMBAR FUSION 4 LEVEL N/A 04/25/2016   Procedure: LUMBAR FIVE-SACRAL ONE POSTERIOR LUMBAR INTERBODY FUSION, THORACIC NINE-SACRAL ONE POSTERIOR  LATERAL ARTHRODESIS WITH PEDICLE SCREWS;  Surgeon: Julio Sicks, MD;  Location: [facility];  Service: Neurosurgery;  Laterality: N/A;   RETINAL DETACHMENT SURGERY Right 2013   ROBOTIC ASSITED PARTIAL NEPHRECTOMY Right 02/05/2015   Procedure: ROBOTIC ASSITED PARTIAL NEPHRECTOMY;  Surgeon: Heloise Purpura, MD;  Location: [facility];  Service: Urology;  Laterality: Right;   SIMPLE MASTECTOMY WITH AXILLARY SENTINEL NODE BIOPSY Right 07/28/2013   Procedure: RIGHT TOTAL  MASTECTOMY;  Surgeon: Wilmon Arms. Corliss Skains, MD;  Location: [facility];  Service: General;  Laterality: Right;   TONSILLECTOMY  AGE 78   TOTAL ABDOMINAL HYSTERECTOMY W/ BILATERAL SALPINGOOPHORECTOMY  1997    SOCIAL HISTORY: Social History   Socioeconomic History   Marital status: Married    Spouse name: Loistine Chance   Number of children: 1   Years of education: Not on file   Highest education level: Not on file  Occupational History   Occupation: homemaker  Tobacco Use   Smoking status: Never   Smokeless tobacco: Never  Vaping Use   Vaping status: Never Used  Substance and Sexual Activity   Alcohol use: No  Drug use: No   Sexual activity: Not Currently    Partners: Male    Birth control/protection: Surgical    Comment: TAH/BSO  Other Topics Concern   Not on file  Social History Narrative   Not on file   Social Drivers of Health   Financial Resource Strain: Not on file  Food Insecurity: No Food Insecurity (12/19/2022)   Hunger Vital Sign    Worried About Running Out of Food in the Last Year: Never true    Ran Out of Food in the Last Year: Never true  Transportation Needs: No Transportation Needs (12/19/2022)   PRAPARE - Administrator, Civil Service (Medical): No    Lack of Transportation (Non-Medical): No  Physical Activity: Not on file  Stress: Not on file  Social Connections: Unknown (03/10/2022)   [facility]   Social Network    Social Network: Not on file  Intimate Partner  Violence: Not At Risk (12/19/2022)   Humiliation, Afraid, Rape, and Kick questionnaire    Fear of Current or Ex-Partner: No    Emotionally Abused: No    Physically Abused: No    Sexually Abused: No    FAMILY HISTORY: Family History  Problem Relation Age of Onset   Heart disease Maternal Grandfather    Diabetes Maternal Grandfather    Colon cancer Maternal Aunt 28   Brain cancer Paternal Grandmother        dx in 4s   Dementia Mother    Diabetes Mother    Osteoporosis Mother    Diabetes Maternal Aunt    Prostate cancer Father 23   Bipolar disorder Maternal Aunt    Stroke Maternal Aunt    Stomach cancer Paternal Uncle        dx in late 33s    ALLERGIES:  is allergic to furosemide, lithium, sulfa antibiotics, trazodone and nefazodone, and lyrica [pregabalin].  MEDICATIONS:  Current Outpatient Medications  Medication Sig Dispense Refill   acalabrutinib maleate (CALQUENCE) 100 MG tablet Take 1 tablet (100 mg total) by mouth 2 (two) times daily. 60 tablet 2   allopurinol (ZYLOPRIM) 300 MG tablet TAKE 1 TABLET(300 MG) BY MOUTH DAILY (Patient taking differently: Take 300 mg by mouth daily.) 90 tablet 4   amitriptyline (ELAVIL) 100 MG tablet Take 100 mg by mouth at bedtime.     aspirin 81 MG EC tablet Take 81 mg by mouth daily.     brexpiprazole (REXULTI) 1 MG TABS tablet Take 1 mg by mouth daily.     budesonide-formoterol (SYMBICORT) 160-4.5 MCG/ACT inhaler Inhale 2 puffs into the lungs 2 (two) times daily. 3 each 3   buPROPion (WELLBUTRIN XL) 300 MG 24 hr tablet Take 300 mg by mouth daily.     Cholecalciferol (VITAMIN D3) 50 MCG (2000 UT) TABS Take 2,000 Units by mouth daily.     COLLAGEN PO Take 4 tablets by mouth daily. Vital protein gummy     Cyanocobalamin (VITAMIN B12) 1000 MCG TBCR Take 1,000 Units by mouth daily.     ferrous sulfate 325 (65 FE) MG tablet Take 325 mg by mouth daily with breakfast.     fluvoxaMINE (LUVOX) 50 MG tablet Take 100 mg by mouth at bedtime.      gabapentin (NEURONTIN) 100 MG capsule Take 200 mg by mouth 2 (two) times daily.     ibuprofen (ADVIL) 200 MG tablet Take 600 mg by mouth in the morning.     levothyroxine (SYNTHROID, LEVOTHROID) 88  MCG tablet Take 88 mcg by mouth daily before breakfast.  4   Multiple Vitamins-Minerals (WOMENS 50+ MULTI VITAMIN/MIN) TABS Take 1 tablet by mouth daily.     omeprazole (PRILOSEC) 40 MG capsule TAKE 1 CAPSULE(40 MG) BY MOUTH DAILY AFTER BREAKFAST 90 capsule 0   oxyCODONE 10 MG TABS Take 1 tablet (10 mg total) by mouth every 3 (three) hours as needed for severe pain ((score 7 to 10)). 40 tablet 0   rosuvastatin (CRESTOR) 5 MG tablet TAKE 1 TABLET(5 MG) BY MOUTH DAILY 30 tablet 3   No current facility-administered medications for this visit.    REVIEW OF SYSTEMS:   Constitutional: ( - ) fevers, ( - )  chills , ( - ) night sweats Eyes: ( - ) blurriness of vision, ( - ) double vision, ( - ) watery eyes Ears, nose, mouth, throat, and face: ( - ) mucositis, ( - ) sore throat Respiratory: ( - ) cough, ( - ) dyspnea, ( - ) wheezes Cardiovascular: ( - ) palpitation, ( - ) chest discomfort, ( - ) lower extremity swelling Gastrointestinal:  ( - ) nausea, ( - ) heartburn, ( - ) change in bowel habits Skin: ( - ) abnormal skin rashes Lymphatics: ( - ) new lymphadenopathy, ( - ) easy bruising Neurological: ( - ) numbness, ( - ) tingling, ( - ) new weaknesses Behavioral/Psych: ( - ) mood change, ( - ) new changes  All other systems were reviewed with the patient and are negative.  PHYSICAL EXAMINATION:  Vitals:   09/24/23 1446  BP: 120/67  Pulse: 100  Resp: 16  Temp: 98.1 F (36.7 C)  SpO2: 98%    Filed Weights   09/24/23 1446  Weight: 194 lb 6.4 oz (88.2 kg)     GENERAL: well appearing elderly Caucasian female. alert, no distress and comfortable SKIN:  skin color, texture, turgor are normal, no rashes or significant lesions EYES: conjunctiva are pink and non-injected, sclera clear LUNGS:  clear to auscultation and percussion with normal breathing effort HEART: regular rate & rhythm and no murmurs and no lower extremity edema Musculoskeletal: no cyanosis of digits and no clubbing  PSYCH: alert & oriented x 3, fluent speech NEURO: no focal motor/sensory deficits  LABORATORY DATA:  I have reviewed the data as listed    Latest Ref Rng & Units 09/24/2023    2:25 PM 08/04/2023    2:02 PM 07/14/2023    2:04 PM  CBC  WBC 4.0 - 10.5 K/uL 13.6  13.0  16.2   Hemoglobin 12.0 - 15.0 g/dL 95.6  38.7  56.4   Hematocrit 36.0 - 46.0 % 36.3  34.3  34.6   Platelets 150 - 400 K/uL 197  173  203        Latest Ref Rng & Units 09/24/2023    2:25 PM 08/04/2023    2:02 PM 07/14/2023    2:04 PM  CMP  Glucose 70 - 99 mg/dL 94  332  92   BUN 8 - 23 mg/dL 16  15  19    Creatinine 0.44 - 1.00 mg/dL 9.51  8.84  1.66   Sodium 135 - 145 mmol/L 134  141  135   Potassium 3.5 - 5.1 mmol/L 4.6  4.8  4.7   Chloride 98 - 111 mmol/L 104  111  105   CO2 22 - 32 mmol/L 23  22  25    Calcium 8.9 - 10.3 mg/dL 9.3  9.2  9.5  Total Protein 6.5 - 8.1 g/dL 6.4  6.2  6.2   Total Bilirubin 0.0 - 1.2 mg/dL 0.4  0.3  0.4   Alkaline Phos 38 - 126 U/L 111  104  107   AST 15 - 41 U/L 29  25  24    ALT 0 - 44 U/L 37  28  25     RADIOGRAPHIC STUDIES: No results found.  ASSESSMENT & PLAN [name] [name] is a 78 y.o. 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  History Adapted from Dr. Darrall Dears last note:   (1) status post right breast lumpectomy and axillary lymph node dissection in 1997 for a stage I breast cancer, treated with adjuvant radiation and tamoxifen for 5 years   (2) status post right breast lower outer quadrant biopsy 07/13/2013 for a clinical T1c N0, stage IA invasive ductal carcinoma, grade 2, estrogen receptor 100% positive, progesterone receptor 13% positive, with an MIB-1 of 33%, and HER-2 amplification by CISH with a  HER2/CEP 17 ratio of 3.19, and an average HER-2 copy number per cell of 4.15   (3) status post right mastectomy 07/28/2013 for a pT1c pN0, stage IA invasive ductal carcinoma, grade 3, with close but negative margins. Prognostic panel was not repeated             (a) the patient met with Dr. Odis Luster and has decided against reconstruction   (4) completed weekly paclitaxel x12 12/06/2913, with trastuzumab/ pertuzumab every 3 weeks; pertuzumab was held with third dose on 11/01/2013 due to diarrhea, tried a half dose on cycle 4 again with diarrhea developing   (5) trastuzumab (started 09/20/2013) continued for 1 year, last dose 09/21/2014;             (a) final echocardiogram 09/07/2014 showed an ejection fraction of 55-60%   (6) anastrozole started May 2015; completed April 2020             (a) bone density 12/15/2013 normal             (b) bone density 02/01/2016 at [place] was normal with a T score of -1.0     OTHER PROBLEMS: (a) History of chronic lymphoid leukemia diagnosed by flow cytometry 06/29/2013, the cells being CD5, CD20 and CD23 positive, CD10 negative.              (1) right renal mass resected on 02/05/15, consisting of atypical lymphoid proliferation composed of monotonous small lymphocytes              (2) Anemia with a normal MCV and normal ferritin-- B-12 and folate normal; stable             (3)  ibrutinib 280 milligrams daily started 03/26/2019, discontinued 06/03/2019 because of concerns regarding dosing             (4) rituximab weekly x8 started 06/15/2019 completed December 2020             (5) maintenance rituximab (Q 2 months) started 10/25/2019, continued through 08/03/2020  (6) 04/29/2022: Started acalabrutinib 100 mg PO twice daily   # CLL Rai Stage I --Due to rapid doubling time of white blood cell count from 28.3 on 10/07/2021 to 66.7 on 04/09/2022, recommendation was to initiate therapy with acalabrutinib 100 mg twice daily, started on 04/29/2022 --Last imaging of the  abdomen in 2022 showed no evidence of splenomegaly Plan:  --Labs today show WBC 13.6, hemoglobin  11.6, MCV 98.1, platelets 197.  Creatinine and LFTs are normal.  --Continue on acalabrutinib at 100 mg twice daily. --RTC in12 weeks for clinic visit   #Head throbbing-resolved.  --Patient reports throbbing sensation without headaches. Likely secondary to acalabrutinib. After temporarily holding medication, symptoms have improved.  --Advised that headaches respond well to caffeine and trying to drink a caffeinated beverage. --Monitor for now and advised to follow up if symptoms worsen.  # ER + Breast Cancer in Survivorship -- Continue yearly mammograms. --last mammogram on 06/09/2022, next due in October 2024.  --Currently in survivorship.  No orders of the defined types were placed in this encounter.  All questions were answered. The patient knows to call the clinic with any problems, questions or concerns.  I have spent a total of 30 minutes minutes of face-to-face and non-face-to-face time, preparing to see the patient, performing a medically appropriate examination, counseling and educating the patient, ordering tests/procedures, documenting clinical information in the electronic health record, and care coordination.   Ulysees Barns, MD Department of Hematology/Oncology [facility] Phone: [phone] Pager: [phone] Email: [email or web address]  09/24/2023 3:49 PM

## 2023-09-29 ENCOUNTER — Other Ambulatory Visit: Payer: Self-pay

## 2023-09-29 NOTE — Progress Notes (Signed)
Rx did not automatically adjudicate on 2/4. Shipping 2/5 sent patient mychart message to inform.

## 2023-09-30 ENCOUNTER — Other Ambulatory Visit: Payer: Self-pay

## 2023-10-01 ENCOUNTER — Other Ambulatory Visit (HOSPITAL_COMMUNITY): Payer: Self-pay

## 2023-10-01 DIAGNOSIS — H02132 Senile ectropion of right lower eyelid: Secondary | ICD-10-CM | POA: Diagnosis not present

## 2023-10-01 DIAGNOSIS — H33311 Horseshoe tear of retina without detachment, right eye: Secondary | ICD-10-CM | POA: Diagnosis not present

## 2023-10-01 DIAGNOSIS — H353131 Nonexudative age-related macular degeneration, bilateral, early dry stage: Secondary | ICD-10-CM | POA: Diagnosis not present

## 2023-10-01 DIAGNOSIS — H524 Presbyopia: Secondary | ICD-10-CM | POA: Diagnosis not present

## 2023-10-01 DIAGNOSIS — H04123 Dry eye syndrome of bilateral lacrimal glands: Secondary | ICD-10-CM | POA: Diagnosis not present

## 2023-10-09 ENCOUNTER — Ambulatory Visit
Admission: EM | Admit: 2023-10-09 | Discharge: 2023-10-09 | Disposition: A | Discharge status: Home / Self Care | Payer: Medicare Other | Attending: Nurse Practitioner | Admitting: Nurse Practitioner

## 2023-10-09 DIAGNOSIS — N3000 Acute cystitis without hematuria: Secondary | ICD-10-CM | POA: Diagnosis not present

## 2023-10-09 LAB — POCT URINALYSIS DIP (MANUAL ENTRY)
Bilirubin, UA: NEGATIVE
Glucose, UA: NEGATIVE mg/dL
Nitrite, UA: POSITIVE — AB
Protein Ur, POC: 100 mg/dL — AB
Spec Grav, UA: >=1.03 — AB
Urobilinogen, UA: 0.2 U/dL
pH, UA: 6

## 2023-10-09 MED ORDER — CEPHALEXIN 500 MG PO CAPS: 500.0000 mg | ORAL_CAPSULE | Freq: Two times a day (BID) | ORAL | 0 refills | Status: AC

## 2023-10-09 NOTE — Discharge Instructions (Signed)
We are treating you today for a UTI with Keflex.  Take as prescribed.   We will contact you if the urine culture shows we need to change the antibiotic.    Continue drinking plenty of water.  Seek care in the Emergency Department if you develop fever, nausea/vomiting, or severe abdominal pain.

## 2023-10-09 NOTE — ED Notes (Signed)
Gave pt a cup of water and she states she still cannot urinate.

## 2023-10-09 NOTE — ED Triage Notes (Signed)
Pt reports she has burning with urination, frequently, cannot void completely x 1 day.   Took 6 tablets between yesterday and today.

## 2023-10-09 NOTE — ED Provider Notes (Signed)
[facility]    CSN: [id number] Arrival date & time: 10/09/23  1346      History   Chief Complaint No chief complaint on file.   HPI [name] [name] is a 78 y.o. female.   Patient presents today with 1 day history of dysuria, urinary frequency, voiding smaller amounts, lower/suprapubic abdominal pain/pressure.  She reports history of UTI, last was more than 1 year ago.  No urinary urgency, foul urinary odor, hematuria, flank pain, fever, nausea, vomiting, vaginal discharge.  Has taken Azo which does help with symptoms temporarily.    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   DDD (degenerative disc disease)    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 ear    Depression    Diverticulosis    Elevated LFTs    Fibromyalgia 1978   GERD (gastroesophageal reflux disease)    Hematuria    History of syncope    episode 2008--  no recurrence since   HLD (hyperlipidemia)    Hypothyroidism    Kidney stones 2014   Plantar fasciitis    Ruptured disk    one in neck and two in back   S/P chemotherapy, time since greater than 12 weeks    Sinus tachycardia    mild resting   Skin cancer    nose   Stool incontinence    "at times recently"     Patient Active Problem List   Diagnosis Date Noted   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 02/02/2023   CAP (community acquired pneumonia) 09/03/2022   RSV infection 09/03/2022   Hyponatremia 09/03/2022   Acute encephalopathy 09/03/2022   RSV (acute bronchiolitis due to respiratory syncytial virus) 09/03/2022   Pure hypercholesterolemia 10/30/2021   Coronary artery disease involving native coronary artery of native heart  without angina pectoris 10/30/2021   History of COVID-19 01/10/2021   Bronchiectasis without complication (HCC) 01/07/2021   Pneumonia due to COVID-19 virus 09/25/2020   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 09/25/2020   Superficial thrombophlebitis 08/23/2018   Iron deficiency anemia 10/01/2017   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   Status post lumbar spine operative procedure for decompression of spinal cord 05/26/2016   Depression 05/05/2016   Degenerative spondylolisthesis 04/25/2016   Spasm of muscle, back 04/25/2016   Genetic testing 11/30/2015   Essential hypertension 10/18/2015   Morbid obesity (HCC) 10/18/2015   Upper airway cough syndrome 10/17/2015   Renal mass 02/05/2015   Anemia 10/25/2013   GERD (gastroesophageal reflux disease) 09/27/2013   CLL (chronic lymphocytic leukemia) (HCC) 07/27/2013   Malignant neoplasm of lower-outer quadrant of right breast of female, estrogen receptor positive (HCC) 07/26/2013   Nephrolithiasis 06/16/2013   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 ear 05/30/2013   Chronic pain syndrome 05/10/2013   DDD (degenerative disc disease) 05/10/2013   SOB (shortness of breath)    Sinus tachycardia    HLD (hyperlipidemia)    Orthostatic hypotension    Anxiety    Arthritis    Diverticulosis    Fibromyalgia    Hypothyroidism     Past Surgical History:  Procedure Laterality Date  ANTERIOR LATERAL LUMBAR FUSION 4 LEVELS Left 04/25/2016   Procedure: LEFT LUMBAR ONE-TWO, LUMBAR TWO-THREE, LUMBAR THREE-FOUR, LUMBAR FOUR-FIVE ANTERIOR LATERAL LUMBAR FUSION;  Surgeon: Julio Sicks, MD;  Location: [facility];  Service: Neurosurgery;  Laterality: Left;   APPENDECTOMY  1997   APPLICATION OF ROBOTIC ASSISTANCE FOR SPINAL PROCEDURE N/A 04/06/2017   Procedure: APPLICATION OF ROBOTIC ASSISTANCE FOR SPINAL PROCEDURE;  Surgeon: Julio Sicks, MD;  Location: [facility];  Service: Neurosurgery;  Laterality: N/A;   BREAST BIOPSY Right 2014    BREAST LUMPECTOMY Right 1997   BREAST LUMPECTOMY WITH AXILLARY LYMPH NODE DISSECTION Right 1997   CATARACT EXTRACTION, BILATERAL  2019   Dr. Alben Spittle   CYSTOSCOPY WITH RETROGRADE PYELOGRAM, URETEROSCOPY AND STENT PLACEMENT Bilateral 06/17/2013   Procedure: CYSTOSCOPY WITH RETROGRADE PYELOGRAM, URETEROSCOPY AND LEFT DOUBLE  J STENT PLACEMENT RIGHT URETERAL HOLMIIUM LASER AND DOUBLE J STENT ;  Surgeon: Lindaann Slough, MD;  Location: [facility];  Service: Urology;  Laterality: Bilateral;   HOLMIUM LASER APPLICATION Bilateral 06/17/2013   Procedure: HOLMIUM LASER APPLICATION;  Surgeon: Lindaann Slough, MD;  Location: [facility] [place];  Service: Urology;  Laterality: Bilateral;   LAMINECTOMY WITH POSTERIOR LATERAL ARTHRODESIS LEVEL 3 N/A 02/02/2023   Procedure: Thoracic six - Thoracic nine Posterior lateral fusion;  Surgeon: Julio Sicks, MD;  Location: [facility];  Service: Neurosurgery;  Laterality: N/A;   LITHOTRIPSY Left 06/2013   LUMBAR EPIDURAL INJECTION     has had 7 injections   PORT-A-CATH REMOVAL Left 10/03/2014   Procedure: REMOVAL PORT-A-CATH;  Surgeon: Manus Rudd, MD;  Location: [facility];  Service: General;  Laterality: Left;   PORTACATH PLACEMENT Left 07/28/2013   Procedure: ATTEMPTED INSERTION PORT-A-CATH;  Surgeon: Wilmon Arms. Corliss Skains, MD;  Location: [facility];  Service: General;  Laterality: Left;   POSTERIOR LUMBAR FUSION 4 LEVEL N/A 04/25/2016   Procedure: LUMBAR FIVE-SACRAL ONE POSTERIOR LUMBAR INTERBODY FUSION, THORACIC NINE-SACRAL ONE POSTERIOR LATERAL ARTHRODESIS WITH PEDICLE SCREWS;  Surgeon: Julio Sicks, MD;  Location: [facility];  Service: Neurosurgery;  Laterality: N/A;   RETINAL DETACHMENT SURGERY Right 2013   ROBOTIC ASSITED PARTIAL NEPHRECTOMY Right 02/05/2015   Procedure: ROBOTIC ASSITED PARTIAL NEPHRECTOMY;  Surgeon: Heloise Purpura, MD;  Location: [facility];  Service: Urology;  Laterality: Right;   SIMPLE MASTECTOMY WITH AXILLARY SENTINEL NODE  BIOPSY Right 07/28/2013   Procedure: RIGHT TOTAL  MASTECTOMY;  Surgeon: Wilmon Arms. Corliss Skains, MD;  Location: [facility];  Service: General;  Laterality: Right;   TONSILLECTOMY  AGE 62   TOTAL ABDOMINAL HYSTERECTOMY W/ BILATERAL SALPINGOOPHORECTOMY  1997    OB History     Gravida  1   Para  1   Term      Preterm      AB      Living  1      SAB      IAB      Ectopic      Multiple      Live Births               Home Medications    Prior to Admission medications   Medication Sig Start Date End Date Taking? Authorizing Provider  cephALEXin (KEFLEX) 500 MG capsule Take 1 capsule (500 mg total) by mouth 2 (two) times daily for 5 days. 10/09/23 10/14/23 Yes Valentino Nose, NP  acalabrutinib maleate (CALQUENCE) 100 MG tablet Take 1 tablet (100 mg total) by mouth 2 (two) times daily. 07/20/23   Ulysees Barns  IV, MD  allopurinol (ZYLOPRIM) 300 MG tablet TAKE 1 TABLET(300 MG) BY MOUTH DAILY Patient taking differently: Take 300 mg by mouth daily. 10/09/22   Jaci Standard, MD  amitriptyline (ELAVIL) 100 MG tablet Take 100 mg by mouth at bedtime.    [provider]  aspirin 81 MG EC tablet Take 81 mg by mouth daily.    [provider]  brexpiprazole (REXULTI) 1 MG TABS tablet Take 1 mg by mouth daily.    [provider]  budesonide-formoterol (SYMBICORT) 160-4.5 MCG/ACT inhaler Inhale 2 puffs into the lungs 2 (two) times daily. 09/09/23 09/08/24  [name], Colbert Coyer, MD  buPROPion (WELLBUTRIN XL) 300 MG 24 hr tablet Take 300 mg by mouth daily. 08/30/19   [provider]  Cholecalciferol (VITAMIN D3) 50 MCG (2000 UT) TABS Take 2,000 Units by mouth daily.    [provider]  COLLAGEN PO Take 4 tablets by mouth daily. Vital protein gummy    [provider]  Cyanocobalamin (VITAMIN B12) 1000 MCG TBCR Take 1,000 Units by mouth daily.    [provider]  ferrous sulfate 325 (65 FE) MG tablet Take 325 mg by mouth daily with  breakfast.    [provider]  fluvoxaMINE (LUVOX) 50 MG tablet Take 100 mg by mouth at bedtime. 09/27/22   [provider]  gabapentin (NEURONTIN) 100 MG capsule Take 200 mg by mouth 2 (two) times daily. 08/20/21   [provider]  ibuprofen (ADVIL) 200 MG tablet Take 600 mg by mouth in the morning.    [provider]  levothyroxine (SYNTHROID, LEVOTHROID) 88 MCG tablet Take 88 mcg by mouth daily before breakfast. 06/30/17   [provider]  Multiple Vitamins-Minerals (WOMENS 50+ MULTI VITAMIN/MIN) TABS Take 1 tablet by mouth daily.    [provider]  omeprazole (PRILOSEC) 40 MG capsule TAKE 1 CAPSULE(40 MG) BY MOUTH DAILY AFTER BREAKFAST 08/12/23   Jaci Standard, MD  oxyCODONE 10 MG TABS Take 1 tablet (10 mg total) by mouth every 3 (three) hours as needed for severe pain ((score 7 to 10)). 02/03/23   Julio Sicks, MD  rosuvastatin (CRESTOR) 5 MG tablet TAKE 1 TABLET(5 MG) BY MOUTH DAILY 09/23/23   Jake Bathe, MD    Family History Family History  Problem Relation Age of Onset   Heart disease Maternal Grandfather    Diabetes Maternal Grandfather    Colon cancer Maternal Aunt 40   Brain cancer Paternal Grandmother        dx in 55s   Dementia Mother    Diabetes Mother    Osteoporosis Mother    Diabetes Maternal Aunt    Prostate cancer Father 4   Bipolar disorder Maternal Aunt    Stroke Maternal Aunt    Stomach cancer Paternal Uncle        dx in late 64s    Social History Social History   Tobacco Use   Smoking status: Never   Smokeless tobacco: Never  Vaping Use   Vaping status: Never Used  Substance Use Topics   Alcohol use: No   Drug use: No     Allergies   Furosemide, Lithium, Sulfa antibiotics, Trazodone and nefazodone, and Lyrica [pregabalin]   Review of Systems Review of Systems Per HPI  Physical Exam Triage Vital Signs ED Triage Vitals  Encounter Vitals Group     BP 10/09/23 1442 120/79     Systolic  BP Percentile --  Diastolic BP Percentile --      Pulse Rate 10/09/23 1442 87     Resp 10/09/23 1442 18     Temp 10/09/23 1442 98.6 F (37 C)     Temp Source 10/09/23 1442 Oral     SpO2 10/09/23 1442 95 %     Weight --      Height --      Head Circumference --      Peak Flow --      Pain Score 10/09/23 1439 0     Pain Loc --      Pain Education --      Exclude from Growth Chart --    No data found.  Updated Vital Signs BP 120/79 (BP Location: Right Arm)   Pulse 87   Temp 98.6 F (37 C) (Oral)   Resp 18   LMP 08/26/1995   SpO2 95%   Visual Acuity Right Eye Distance:   Left Eye Distance:   Bilateral Distance:    Right Eye Near:   Left Eye Near:    Bilateral Near:     Physical Exam Vitals and nursing note reviewed.  Constitutional:      General: She is not in acute distress.    Appearance: She is not toxic-appearing.  HENT:     Mouth/Throat:     Mouth: Mucous membranes are moist.     Pharynx: Oropharynx is clear.  Cardiovascular:     Rate and Rhythm: Normal rate.  Pulmonary:     Effort: Pulmonary effort is normal. No respiratory distress.     Breath sounds: Normal breath sounds. No wheezing, rhonchi or rales.  Abdominal:     General: Abdomen is flat. Bowel sounds are normal. There is no distension.     Palpations: Abdomen is soft. There is no mass.     Tenderness: There is no abdominal tenderness. There is no right CVA tenderness, left CVA tenderness or guarding.  Skin:    General: Skin is warm and dry.     Coloration: Skin is not jaundiced or pale.     Findings: No erythema.  Neurological:     Mental Status: She is alert and oriented to person, place, and time.     Motor: No weakness.     Gait: Gait normal.  Psychiatric:        Behavior: Behavior is cooperative.      UC Treatments / Results  Labs (all labs ordered are listed, but only abnormal results are displayed) Labs Reviewed  POCT URINALYSIS DIP (MANUAL ENTRY) - Abnormal; Notable for the  following components:      Result Value   Clarity, UA cloudy (*)    Ketones, POC UA trace (5) (*)    Spec Grav, UA >=1.030 (*)    Blood, UA moderate (*)    Protein Ur, POC =100 (*)    Nitrite, UA Positive (*)    Leukocytes, UA Large (3+) (*)    All other components within normal limits  URINE CULTURE    EKG   Radiology No results found.  Procedures Procedures (including critical care time)  Medications Ordered in UC Medications - No data to display  Initial Impression / Assessment and Plan / UC Course  I have reviewed the triage vital signs and the nursing notes.  Pertinent labs & imaging results that were available during my care of the patient were reviewed by me and considered in my medical decision making (see chart for details).  Patient is well-appearing, normotensive, afebrile, not tachycardic, not tachypneic, oxygenating well on room air.    1. Acute cystitis without hematuria Urinalysis is unreliable secondary to Azo use Urine culture is pending Treat with Keflex twice daily for 5 days Return and ER precautions discussed with patient  The patient was given the opportunity to ask questions.  All questions answered to their satisfaction.  The patient is in agreement to this plan.    Final Clinical Impressions(s) / UC Diagnoses   Final diagnoses:  Acute cystitis without hematuria     Discharge Instructions      We are treating you today for a UTI with Keflex.  Take as prescribed.   We will contact you if the urine culture shows we need to change the antibiotic.    Continue drinking plenty of water.  Seek care in the Emergency Department if you develop fever, nausea/vomiting, or severe abdominal pain.     ED Prescriptions     Medication Sig Dispense Auth. Provider   cephALEXin (KEFLEX) 500 MG capsule Take 1 capsule (500 mg total) by mouth 2 (two) times daily for 5 days. 10 capsule Valentino Nose, NP      PDMP not reviewed this encounter.    Valentino Nose, NP 10/09/23 1616

## 2023-10-11 LAB — URINE CULTURE: Culture: >=100000 — AB

## 2023-10-20 ENCOUNTER — Other Ambulatory Visit: Payer: Self-pay

## 2023-10-22 ENCOUNTER — Ambulatory Visit: Payer: Medicare Other | Admitting: Nurse Practitioner

## 2023-10-26 ENCOUNTER — Other Ambulatory Visit (HOSPITAL_COMMUNITY): Payer: Self-pay

## 2023-10-27 ENCOUNTER — Other Ambulatory Visit (HOSPITAL_COMMUNITY): Payer: Self-pay

## 2023-10-30 ENCOUNTER — Other Ambulatory Visit (HOSPITAL_COMMUNITY): Payer: Self-pay

## 2023-10-30 ENCOUNTER — Other Ambulatory Visit (HOSPITAL_BASED_OUTPATIENT_CLINIC_OR_DEPARTMENT_OTHER): Payer: Self-pay

## 2023-11-03 ENCOUNTER — Other Ambulatory Visit: Payer: Self-pay | Admitting: Hematology and Oncology

## 2023-11-03 ENCOUNTER — Other Ambulatory Visit (HOSPITAL_COMMUNITY): Payer: Self-pay

## 2023-11-03 ENCOUNTER — Other Ambulatory Visit: Payer: Self-pay

## 2023-11-03 MED ORDER — CALQUENCE 100 MG PO TABS
100.0000 mg | ORAL_TABLET | Freq: Two times a day (BID) | ORAL | 2 refills | Status: DC
  Filled 2023-11-03: qty 60, 30d supply, fill #0
  Filled 2023-11-25: qty 60, 30d supply, fill #1
  Filled 2024-01-06: qty 60, 30d supply, fill #2

## 2023-11-03 NOTE — Progress Notes (Addendum)
 Specialty Pharmacy Refill Coordination Note  [name] [name] is a 78 y.o. female contacted today regarding refills of specialty medication(s) Acalabrutinib Maleate (Calquence)   Patient requested Delivery   Delivery date: 11/05/23   Verified address: [address] [place] [address]   Medication will be filled on 11/04/23. This fill date is pending response to refill request from provider. Patient is aware and if they have not received fill by intended date they must follow up with pharmacy.    Update, refill request came back today, shipping 3/11 for delivery on 3/12.  Patient was notified.

## 2023-11-10 ENCOUNTER — Other Ambulatory Visit: Payer: Self-pay | Admitting: Hematology and Oncology

## 2023-11-10 DIAGNOSIS — C50011 Malignant neoplasm of nipple and areola, right female breast: Secondary | ICD-10-CM

## 2023-11-10 DIAGNOSIS — C9110 Chronic lymphocytic leukemia of B-cell type not having achieved remission: Secondary | ICD-10-CM

## 2023-11-19 ENCOUNTER — Other Ambulatory Visit (HOSPITAL_COMMUNITY): Payer: Self-pay

## 2023-11-23 ENCOUNTER — Other Ambulatory Visit (HOSPITAL_COMMUNITY): Payer: Self-pay

## 2023-11-25 ENCOUNTER — Other Ambulatory Visit: Payer: Self-pay

## 2023-11-25 NOTE — Progress Notes (Signed)
 Specialty Pharmacy Ongoing Clinical Assessment Note  [name] [name] is a 78 y.o. female who is being followed by the specialty pharmacy service for RxSp Oncology   Patient's specialty medication(s) reviewed today: Acalabrutinib Maleate (Calquence)   Missed doses in the last 4 weeks: 0   Patient/Caregiver did not have any additional questions or concerns.   Therapeutic benefit summary: Patient is achieving benefit   Adverse events/side effects summary: No adverse events/side effects   Patient's therapy is appropriate to: Continue    Goals Addressed             This Visit's Progress    Achieve or maintain remission   On track    Patient is on track. Patient will maintain adherence         Follow up:  6 months  Otto Herb Specialty Pharmacist

## 2023-11-25 NOTE — Progress Notes (Signed)
 Specialty Pharmacy Refill Coordination Note  [name] [name] is a 78 y.o. female contacted today regarding refills of specialty medication(s) Acalabrutinib Maleate (Calquence)   Patient requested Delivery   Delivery date: 12/01/23   Verified address: [address] [place] [address]   Medication will be filled on 11/30/23.

## 2023-11-30 ENCOUNTER — Other Ambulatory Visit: Payer: Self-pay

## 2023-12-04 ENCOUNTER — Emergency Department (HOSPITAL_COMMUNITY)

## 2023-12-04 ENCOUNTER — Ambulatory Visit: Payer: Medicare Other | Admitting: Physician Assistant

## 2023-12-04 ENCOUNTER — Other Ambulatory Visit: Payer: Self-pay

## 2023-12-04 ENCOUNTER — Emergency Department (HOSPITAL_COMMUNITY)
Admission: EM | Admit: 2023-12-04 | Discharge: 2023-12-04 | Disposition: A | Discharge status: Home / Self Care | Attending: Emergency Medicine | Admitting: Emergency Medicine

## 2023-12-04 ENCOUNTER — Encounter: Payer: Self-pay | Admitting: Emergency Medicine

## 2023-12-04 ENCOUNTER — Ambulatory Visit
Admission: EM | Admit: 2023-12-04 | Discharge: 2023-12-04 | Disposition: A | Discharge status: Home / Self Care | Attending: Internal Medicine | Admitting: Internal Medicine

## 2023-12-04 ENCOUNTER — Encounter (HOSPITAL_COMMUNITY): Payer: Self-pay | Admitting: Emergency Medicine

## 2023-12-04 DIAGNOSIS — E871 Hypo-osmolality and hyponatremia: Secondary | ICD-10-CM | POA: Diagnosis not present

## 2023-12-04 DIAGNOSIS — R413 Other amnesia: Secondary | ICD-10-CM | POA: Diagnosis not present

## 2023-12-04 DIAGNOSIS — R251 Tremor, unspecified: Secondary | ICD-10-CM | POA: Diagnosis not present

## 2023-12-04 DIAGNOSIS — R531 Weakness: Secondary | ICD-10-CM

## 2023-12-04 DIAGNOSIS — Z7982 Long term (current) use of aspirin: Secondary | ICD-10-CM | POA: Insufficient documentation

## 2023-12-04 LAB — COMPREHENSIVE METABOLIC PANEL WITH GFR
ALT: 34 U/L (ref 0–44)
AST: 34 U/L (ref 15–41)
Albumin: 3.9 g/dL (ref 3.5–5.0)
Alkaline Phosphatase: 89 U/L (ref 38–126)
Anion gap: 9 (ref 5–15)
BUN: 16 mg/dL (ref 8–23)
CO2: 20 mmol/L — ABNORMAL LOW (ref 22–32)
Calcium: 9.2 mg/dL (ref 8.9–10.3)
Chloride: 105 mmol/L (ref 98–111)
Creatinine, Ser: 1.03 mg/dL — ABNORMAL HIGH (ref 0.44–1.00)
GFR, Estimated: 56 mL/min — ABNORMAL LOW (ref >60)
Glucose, Bld: 134 mg/dL — ABNORMAL HIGH (ref 70–99)
Potassium: 4.7 mmol/L (ref 3.5–5.1)
Sodium: 134 mmol/L — ABNORMAL LOW (ref 135–145)
Total Bilirubin: 0.4 mg/dL (ref 0.0–1.2)
Total Protein: 6 g/dL — ABNORMAL LOW (ref 6.5–8.1)

## 2023-12-04 LAB — URINALYSIS, ROUTINE W REFLEX MICROSCOPIC
Bilirubin Urine: NEGATIVE
Glucose, UA: NEGATIVE mg/dL
Hgb urine dipstick: NEGATIVE
Ketones, ur: NEGATIVE mg/dL
Leukocytes,Ua: NEGATIVE
Nitrite: NEGATIVE
Protein, ur: NEGATIVE mg/dL
Specific Gravity, Urine: 1.006 (ref 1.005–1.030)
pH: 6 (ref 5.0–8.0)

## 2023-12-04 LAB — CBC WITH DIFFERENTIAL/PLATELET
Abs Immature Granulocytes: 0.03 10*3/uL (ref 0.00–0.07)
Basophils Absolute: 0.1 10*3/uL (ref 0.0–0.1)
Basophils Relative: 1 %
Eosinophils Absolute: 0.6 10*3/uL — ABNORMAL HIGH (ref 0.0–0.5)
Eosinophils Relative: 6 %
HCT: 34.5 % — ABNORMAL LOW (ref 36.0–46.0)
Hemoglobin: 10.8 g/dL — ABNORMAL LOW (ref 12.0–15.0)
Immature Granulocytes: 0 %
Lymphocytes Relative: 46 %
Lymphs Abs: 4.3 10*3/uL — ABNORMAL HIGH (ref 0.7–4.0)
MCH: 31.3 pg (ref 26.0–34.0)
MCHC: 31.3 g/dL (ref 30.0–36.0)
MCV: 100 fL (ref 80.0–100.0)
Monocytes Absolute: 0.6 10*3/uL (ref 0.1–1.0)
Monocytes Relative: 6 %
Neutro Abs: 3.9 10*3/uL (ref 1.7–7.7)
Neutrophils Relative %: 41 %
Platelets: 181 10*3/uL (ref 150–400)
RBC: 3.45 MIL/uL — ABNORMAL LOW (ref 3.87–5.11)
RDW: 14.1 % (ref 11.5–15.5)
WBC: 9.4 10*3/uL (ref 4.0–10.5)
nRBC: 0 % (ref 0.0–0.2)

## 2023-12-04 LAB — POCT FASTING CBG KUC MANUAL ENTRY: POCT Glucose (KUC): 117 mg/dL — AB (ref 70–99)

## 2023-12-04 MED ORDER — SODIUM CHLORIDE 0.9 % IV BOLUS
1000.0000 mL | Freq: Once | INTRAVENOUS | Status: AC
Start: 2023-12-04 — End: 2023-12-04
  Administered 2023-12-04: 1000 mL via INTRAVENOUS

## 2023-12-04 NOTE — ED Triage Notes (Signed)
 Pt to ER with c/o weakness and tremors for last several days.  Pt has hx of low sodium and family feels this may be the problem.  Was seen at [facility] and referred here for evaluation.

## 2023-12-04 NOTE — ED Triage Notes (Signed)
 Weakness, shaky and feels like she is going to fall since yesterday.  History of low sodium.  Limbs feel weak.

## 2023-12-04 NOTE — ED Notes (Signed)
 Patient is being discharged from the Urgent Care and sent to the Emergency Department via private vehicle . Per NP, patient is in need of higher level of care due to weakness and shaky. Patient is aware and verbalizes understanding of plan of care.  Vitals:   12/04/23 1400  BP: 122/62  Pulse: 93  Resp: 18  Temp: 99 F (37.2 C)  SpO2: 98%

## 2023-12-04 NOTE — Discharge Instructions (Signed)
 Follow-up with your family doctor next week for recheck.  Make sure you tell them that the scan of your head showed an old small stroke that they can decide if you need to see a specialist later

## 2023-12-04 NOTE — ED Provider Notes (Signed)
 [facility]    CSN: [id number] Arrival date & time: 12/04/23  1351      History   Chief Complaint No chief complaint on file.   HPI [name] [name] is a 78 y.o. female.   Patient presents to urgent care with her daughter who contributes to the history for evaluation of generalized weakness, fatigue, dizziness with position changes, and feeling "shaky" for the last 2 to 3 days.  Daughter notes that patient was having a difficult time standing up on 2 feet to get out of bed and had to rock herself forward multiple times to be able to have the strength to stand up.  She additionally reports difficulty lifting up her legs to walk up steps over the last few days.  Denies unilateral extremity weakness, paresthesias, recent trauma/injuries to the body/head, vision changes, urinary symptoms, and recent falls within the last month.  Patient reports frequent falls in the last 2 to 3 months.  No recent acute memory/cognitive changes per daughter.  She has had some gradual memory loss over the last 6 months.  No recent fevers or chills, nausea, vomiting, or diarrhea.  She is not diabetic and has eaten today.  Denies history of stroke.  She drinks unsweetened caffeine free tea daily and states "I do not drink any water".   Reports history of hyponatremia and states at that time she was "delirious".  She takes sodium supplements daily.     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   DDD (degenerative disc disease)    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 ear    Depression    Diverticulosis    Elevated LFTs    Fibromyalgia 1978   GERD (gastroesophageal reflux disease)    Hematuria    History of syncope     episode 2008--  no recurrence since   HLD (hyperlipidemia)    Hypothyroidism    Kidney stones 2014   Plantar fasciitis    Ruptured disk    one in neck and two in back   S/P chemotherapy, time since greater than 12 weeks    Sinus tachycardia    mild resting   Skin cancer    nose   Stool incontinence    "at times recently"     Patient Active Problem List   Diagnosis Date Noted   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 02/02/2023   CAP (community acquired pneumonia) 09/03/2022   RSV infection 09/03/2022   Hyponatremia 09/03/2022   Acute encephalopathy 09/03/2022   RSV (acute bronchiolitis due to respiratory syncytial virus) 09/03/2022   Pure hypercholesterolemia 10/30/2021   Coronary artery disease involving native coronary artery of native heart without angina pectoris 10/30/2021   History of COVID-19 01/10/2021   Bronchiectasis without complication (HCC) 01/07/2021   Pneumonia due to COVID-19 virus 09/25/2020   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 09/25/2020   Superficial thrombophlebitis 08/23/2018   Iron deficiency anemia 10/01/2017   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   Status post lumbar spine operative procedure for decompression of spinal cord 05/26/2016   Depression 05/05/2016   Degenerative spondylolisthesis  04/25/2016   Spasm of muscle, back 04/25/2016   Genetic testing 11/30/2015   Essential hypertension 10/18/2015   Morbid obesity (HCC) 10/18/2015   Upper airway cough syndrome 10/17/2015   Renal mass 02/05/2015   Anemia 10/25/2013   GERD (gastroesophageal reflux disease) 09/27/2013   CLL (chronic lymphocytic leukemia) (HCC) 07/27/2013   Malignant neoplasm of lower-outer quadrant of right breast of female, estrogen receptor positive (HCC) 07/26/2013   Nephrolithiasis 06/16/2013   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 ear 05/30/2013   Chronic pain syndrome 05/10/2013   DDD (degenerative disc  disease) 05/10/2013   SOB (shortness of breath)    Sinus tachycardia    HLD (hyperlipidemia)    Orthostatic hypotension    Anxiety    Arthritis    Diverticulosis    Fibromyalgia    Hypothyroidism     Past Surgical History:  Procedure Laterality Date   ANTERIOR LATERAL LUMBAR FUSION 4 LEVELS Left 04/25/2016   Procedure: LEFT LUMBAR ONE-TWO, LUMBAR TWO-THREE, LUMBAR THREE-FOUR, LUMBAR FOUR-FIVE ANTERIOR LATERAL LUMBAR FUSION;  Surgeon: Julio Sicks, MD;  Location: [facility];  Service: Neurosurgery;  Laterality: Left;   APPENDECTOMY  1997   APPLICATION OF ROBOTIC ASSISTANCE FOR SPINAL PROCEDURE N/A 04/06/2017   Procedure: APPLICATION OF ROBOTIC ASSISTANCE FOR SPINAL PROCEDURE;  Surgeon: Julio Sicks, MD;  Location[facility];  Service: Neurosurgery;  Laterality: N/A;   BREAST BIOPSY Right 2014   BREAST LUMPECTOMY Right 1997   BREAST LUMPECTOMY WITH AXILLARY LYMPH NODE DISSECTION Right 1997   CATARACT EXTRACTION, BILATERAL  2019   Dr. Alben Spittle   CYSTOSCOPY WITH RETROGRADE PYELOGRAM, URETEROSCOPY AND STENT PLACEMENT Bilateral 06/17/2013   Procedure: CYSTOSCOPY WITH RETROGRADE PYELOGRAM, URETEROSCOPY AND LEFT DOUBLE  J STENT PLACEMENT RIGHT URETERAL HOLMIIUM LASER AND DOUBLE J STENT ;  Surgeon: Lindaann Slough, MD;  Location: [facility];  Service: Urology;  Laterality: Bilateral;   HOLMIUM LASER APPLICATION Bilateral 06/17/2013   Procedure: HOLMIUM LASER APPLICATION;  Surgeon: Lindaann Slough, MD;  Location: [facility];  Service: Urology;  Laterality: Bilateral;   LAMINECTOMY WITH POSTERIOR LATERAL ARTHRODESIS LEVEL 3 N/A 02/02/2023   Procedure: Thoracic six - Thoracic nine Posterior lateral fusion;  Surgeon: Julio Sicks, MD;  Location: [facility];  Service: Neurosurgery;  Laterality: N/A;   LITHOTRIPSY Left 06/2013   LUMBAR EPIDURAL INJECTION     has had 7 injections   PORT-A-CATH REMOVAL Left 10/03/2014   Procedure: REMOVAL PORT-A-CATH;  Surgeon: Manus Rudd, MD;   Location: [facility];  Service: General;  Laterality: Left;   PORTACATH PLACEMENT Left 07/28/2013   Procedure: ATTEMPTED INSERTION PORT-A-CATH;  Surgeon: Wilmon Arms. Corliss Skains, MD;  Location: [facility];  Service: General;  Laterality: Left;   POSTERIOR LUMBAR FUSION 4 LEVEL N/A 04/25/2016   Procedure: LUMBAR FIVE-SACRAL ONE POSTERIOR LUMBAR INTERBODY FUSION, THORACIC NINE-SACRAL ONE POSTERIOR LATERAL ARTHRODESIS WITH PEDICLE SCREWS;  Surgeon: Julio Sicks, MD;  Location: [facility];  Service: Neurosurgery;  Laterality: N/A;   RETINAL DETACHMENT SURGERY Right 2013   ROBOTIC ASSITED PARTIAL NEPHRECTOMY Right 02/05/2015   Procedure: ROBOTIC ASSITED PARTIAL NEPHRECTOMY;  Surgeon: Heloise Purpura, MD;  Location: [facility];  Service: Urology;  Laterality: Right;   SIMPLE MASTECTOMY WITH AXILLARY SENTINEL NODE BIOPSY Right 07/28/2013   Procedure: RIGHT TOTAL  MASTECTOMY;  Surgeon: Wilmon Arms. Corliss Skains, MD;  Location: [facility];  Service: General;  Laterality: Right;   TONSILLECTOMY  AGE 33   TOTAL ABDOMINAL HYSTERECTOMY W/ BILATERAL SALPINGOOPHORECTOMY  1997    OB History  Gravida  1   Para  1   Term      Preterm      AB      Living  1      SAB      IAB      Ectopic      Multiple      Live Births               Home Medications    Prior to Admission medications   Medication Sig Start Date End Date Taking? Authorizing Provider  acalabrutinib maleate (CALQUENCE) 100 MG tablet Take 1 tablet (100 mg total) by mouth 2 (two) times daily. 11/03/23   Jaci Standard, MD  allopurinol (ZYLOPRIM) 300 MG tablet TAKE 1 TABLET(300 MG) BY MOUTH DAILY 11/10/23   Jaci Standard, MD  amitriptyline (ELAVIL) 100 MG tablet Take 100 mg by mouth at bedtime.    [provider]  aspirin 81 MG EC tablet Take 81 mg by mouth daily.    [provider]  brexpiprazole (REXULTI) 1 MG TABS tablet Take 1 mg by mouth daily.    [provider]  budesonide-formoterol (SYMBICORT)  160-4.5 MCG/ACT inhaler Inhale 2 puffs into the lungs 2 (two) times daily. 09/09/23 09/08/24  [name], Colbert Coyer, MD  buPROPion (WELLBUTRIN XL) 300 MG 24 hr tablet Take 300 mg by mouth daily. 08/30/19   [provider]  Cholecalciferol (VITAMIN D3) 50 MCG (2000 UT) TABS Take 2,000 Units by mouth daily.    [provider]  COLLAGEN PO Take 4 tablets by mouth daily. Vital protein gummy    [provider]  Cyanocobalamin (VITAMIN B12) 1000 MCG TBCR Take 1,000 Units by mouth daily.    [provider]  ferrous sulfate 325 (65 FE) MG tablet Take 325 mg by mouth daily with breakfast.    [provider]  fluvoxaMINE (LUVOX) 50 MG tablet Take 100 mg by mouth at bedtime. 09/27/22   [provider]  gabapentin (NEURONTIN) 100 MG capsule Take 200 mg by mouth 2 (two) times daily. 08/20/21   [provider]  ibuprofen (ADVIL) 200 MG tablet Take 600 mg by mouth in the morning.    [provider]  levothyroxine (SYNTHROID, LEVOTHROID) 88 MCG tablet Take 88 mcg by mouth daily before breakfast. 06/30/17   [provider]  Multiple Vitamins-Minerals (WOMENS 50+ MULTI VITAMIN/MIN) TABS Take 1 tablet by mouth daily.    [provider]  omeprazole (PRILOSEC) 40 MG capsule TAKE 1 CAPSULE(40 MG) BY MOUTH DAILY AFTER BREAKFAST 11/10/23   Jaci Standard, MD  oxyCODONE 10 MG TABS Take 1 tablet (10 mg total) by mouth every 3 (three) hours as needed for severe pain ((score 7 to 10)). 02/03/23   Julio Sicks, MD  rosuvastatin (CRESTOR) 5 MG tablet TAKE 1 TABLET(5 MG) BY MOUTH DAILY 09/23/23   Jake Bathe, MD    Family History Family History  Problem Relation Age of Onset   Heart disease Maternal Grandfather    Diabetes Maternal Grandfather    Colon cancer Maternal Aunt 56   Brain cancer Paternal Grandmother        dx in 60s   Dementia Mother    Diabetes Mother    Osteoporosis Mother    Diabetes Maternal Aunt    Prostate cancer Father 31    Bipolar disorder Maternal Aunt    Stroke Maternal Aunt    Stomach cancer Paternal Uncle  dx in late 34s    Social History Social History   Tobacco Use   Smoking status: Never   Smokeless tobacco: Never  Vaping Use   Vaping status: Never Used  Substance Use Topics   Alcohol use: No   Drug use: No     Allergies   Furosemide, Lithium, Sulfa antibiotics, Trazodone and nefazodone, and Lyrica [pregabalin]   Review of Systems Review of Systems Per HPI  Physical Exam Triage Vital Signs ED Triage Vitals  Encounter Vitals Group     BP 12/04/23 1400 122/62     Systolic BP Percentile --      Diastolic BP Percentile --      Pulse Rate 12/04/23 1400 93     Resp 12/04/23 1400 18     Temp 12/04/23 1400 99 F (37.2 C)     Temp Source 12/04/23 1400 Oral     SpO2 12/04/23 1400 98 %     Weight --      Height --      Head Circumference --      Peak Flow --      Pain Score 12/04/23 1402 0     Pain Loc --      Pain Education --      Exclude from Growth Chart --    No data found.  Updated Vital Signs BP 122/62 (BP Location: Left Arm)   Pulse 93   Temp 99 F (37.2 C) (Oral)   Resp 18   LMP 08/26/1995   SpO2 98%   Visual Acuity Right Eye Distance:   Left Eye Distance:   Bilateral Distance:    Right Eye Near:   Left Eye Near:    Bilateral Near:     Physical Exam Vitals and nursing note reviewed.  Constitutional:      Appearance: She is not ill-appearing or toxic-appearing.  HENT:     Head: Normocephalic and atraumatic.     Right Ear: Hearing and external ear normal.     Left Ear: Hearing and external ear normal.     Nose: Nose normal.     Mouth/Throat:     Lips: Pink.  Eyes:     General: Lids are normal. Vision grossly intact. Gaze aligned appropriately.     Extraocular Movements: Extraocular movements intact.     Conjunctiva/sclera: Conjunctivae normal.  Cardiovascular:     Rate and Rhythm: Normal rate and regular rhythm.     Heart sounds: Normal  heart sounds, S1 normal and S2 normal.  Pulmonary:     Effort: Pulmonary effort is normal. No respiratory distress.     Breath sounds: Normal breath sounds and air entry.  Musculoskeletal:     Cervical back: Neck supple.  Skin:    General: Skin is warm and dry.     Capillary Refill: Capillary refill takes less than 2 seconds.     Findings: No rash.  Neurological:     General: No focal deficit present.     Mental Status: She is alert and oriented to person, place, and time. Mental status is at baseline.     GCS: GCS eye subscore is 4. GCS verbal subscore is 5. GCS motor subscore is 6.     Cranial Nerves: Cranial nerves 2-12 are intact. No dysarthria or facial asymmetry.     Sensory: Sensation is intact.     Motor: Motor function is intact. No weakness, tremor, abnormal muscle tone or pronator drift.     Coordination: Coordination is  intact. Romberg sign negative. Coordination normal. Finger-Nose-Finger Test normal.     Gait: Gait is intact.     Comments: Strength and sensation intact to bilateral upper and lower extremities (5/5). Moves all 4 extremities with normal coordination voluntarily. Non-focal neuro exam.   Psychiatric:        Mood and Affect: Mood normal.        Speech: Speech normal.        Behavior: Behavior normal.        Thought Content: Thought content normal.        Judgment: Judgment normal.      UC Treatments / Results  Labs (all labs ordered are listed, but only abnormal results are displayed) Labs Reviewed  POCT FASTING CBG [facility] MANUAL ENTRY - Abnormal; Notable for the following components:      Result Value   POCT Glucose ([facility]) 117 (*)    All other components within normal limits    EKG   Radiology No results found.  Procedures Procedures (including critical care time)  Medications Ordered in UC Medications - No data to display  Initial Impression / Assessment and Plan / UC Course  I have reviewed the triage vital signs and the nursing  notes.  Pertinent labs & imaging results that were available during my care of the patient were reviewed by me and considered in my medical decision making (see chart for details).   1.  Generalized weakness Neurologically intact at baseline without focal deficit.  Recommend stat labs and further workup and evaluation in the emergency department setting to rule out acute electrolyte abnormality/intracranial abnormality contributing to diffuse weakness.  CBG 117 prior to discharge.  Vital signs stable. She is ambulatory with steady gait with assistance by her daughter.  Discussed clinical concerns/exam findings leading to recommendation for further workup in the ER setting and risks of deferring ER visit with patient/family. Patient/family express understanding and agreement with plan, discharged to ER via private car with daughter.    Final Clinical Impressions(s) / UC Diagnoses   Final diagnoses:  Generalized weakness   Discharge Instructions   None    ED Prescriptions   None    PDMP not reviewed this encounter.   Carlisle Beers, [place] 12/04/23 1425

## 2023-12-05 NOTE — ED Provider Notes (Signed)
 [name] EMERGENCY DEPARTMENT AT [name] Provider Note   CSN: [id number] Arrival date & time: 12/04/23  1517     History  Chief Complaint  Patient presents with   Weakness    [name] [name] is a 78 y.o. female.  Patient presents with worsening weakness.  She has weakness in her legs and her arms.  This has happened before when she was hyponatremic.  Also patient has a history of spinal disease and makes her legs weak  The history is provided by the patient and medical records. No language interpreter was used.  Weakness Severity:  Moderate Onset quality:  Gradual Timing:  Constant Progression:  Waxing and waning Chronicity:  New Context: not alcohol use   Relieved by:  Nothing Worsened by:  Nothing Ineffective treatments:  None tried Associated symptoms: no abdominal pain, no chest pain, no cough, no diarrhea, no frequency, no headaches and no seizures   Risk factors: no anemia        Home Medications Prior to Admission medications   Medication Sig Start Date End Date Taking? Authorizing Provider  acalabrutinib maleate (CALQUENCE) 100 MG tablet Take 1 tablet (100 mg total) by mouth 2 (two) times daily. 11/03/23   [name], [name], MD  allopurinol (ZYLOPRIM) 300 MG tablet TAKE 1 TABLET(300 MG) BY MOUTH DAILY 11/10/23   [name], [name], MD  amitriptyline (ELAVIL) 100 MG tablet Take 100 mg by mouth at bedtime.    [provider]  aspirin 81 MG EC tablet Take 81 mg by mouth daily.    [provider]  brexpiprazole (REXULTI) 1 MG TABS tablet Take 1 mg by mouth daily.    [provider]  budesonide-formoterol (SYMBICORT) 160-4.5 MCG/ACT inhaler Inhale 2 puffs into the lungs 2 (two) times daily. 09/09/23 09/08/24  [name], [name], MD  buPROPion (WELLBUTRIN XL) 300 MG 24 hr tablet Take 300 mg by mouth daily. 08/30/19   [provider]  Cholecalciferol (VITAMIN D3) 50 MCG (2000 UT) TABS Take 2,000 Units by mouth daily.     [provider]  COLLAGEN PO Take 4 tablets by mouth daily. Vital protein gummy    [provider]  Cyanocobalamin (VITAMIN B12) 1000 MCG TBCR Take 1,000 Units by mouth daily.    [provider]  ferrous sulfate 325 (65 FE) MG tablet Take 325 mg by mouth daily with breakfast.    [provider]  fluvoxaMINE (LUVOX) 50 MG tablet Take 100 mg by mouth at bedtime. 09/27/22   [provider]  gabapentin (NEURONTIN) 100 MG capsule Take 200 mg by mouth 2 (two) times daily. 08/20/21   [provider]  ibuprofen (ADVIL) 200 MG tablet Take 600 mg by mouth in the morning.    [provider]  levothyroxine (SYNTHROID, LEVOTHROID) 88 MCG tablet Take 88 mcg by mouth daily before breakfast. 06/30/17   [provider]  Multiple Vitamins-Minerals (WOMENS 50+ MULTI VITAMIN/MIN) TABS Take 1 tablet by mouth daily.    [provider]  omeprazole (PRILOSEC) 40 MG capsule TAKE 1 CAPSULE(40 MG) BY MOUTH DAILY AFTER BREAKFAST 11/10/23   [name], [name], MD  oxyCODONE 10 MG TABS Take 1 tablet (10 mg total) by mouth every 3 (three) hours as needed for severe pain ((score 7 to 10)). 02/03/23   [name], MD  rosuvastatin (CRESTOR) 5 MG tablet TAKE 1 TABLET(5 MG) BY MOUTH DAILY 09/23/23   [name], MD      Allergies  Furosemide, Lithium, Sulfa antibiotics, Trazodone and nefazodone, and Lyrica [pregabalin]    Review of Systems   Review of Systems  Constitutional:  Negative for appetite change and fatigue.  HENT:  Negative for congestion, ear discharge and sinus pressure.   Eyes:  Negative for discharge.  Respiratory:  Negative for cough.   Cardiovascular:  Negative for chest pain.  Gastrointestinal:  Negative for abdominal pain and diarrhea.  Genitourinary:  Negative for frequency and hematuria.  Musculoskeletal:  Negative for back pain.  Skin:  Negative for rash.  Neurological:  Positive for weakness. Negative for seizures and  headaches.  Psychiatric/Behavioral:  Negative for hallucinations.     Physical Exam Updated Vital Signs BP 138/60   Pulse 80   Temp 98.7 F (37.1 C) (Oral)   Resp 18   Ht 5\' 3"  (1.6 m)   Wt 86.2 kg   LMP 08/26/1995   SpO2 95%   BMI 33.66 kg/m  Physical Exam Vitals and nursing note reviewed.  Constitutional:      Appearance: She is well-developed.  HENT:     Head: Normocephalic.     Nose: Nose normal.  Eyes:     General: No scleral icterus.    Conjunctiva/sclera: Conjunctivae normal.  Neck:     Thyroid: No thyromegaly.  Cardiovascular:     Rate and Rhythm: Normal rate and regular rhythm.     Heart sounds: No murmur heard.    No friction rub. No gallop.  Pulmonary:     Breath sounds: No stridor. No wheezing or rales.  Chest:     Chest wall: No tenderness.  Abdominal:     General: There is no distension.     Tenderness: There is no abdominal tenderness. There is no rebound.  Musculoskeletal:        General: Normal range of motion.     Cervical back: Neck supple.  Lymphadenopathy:     Cervical: No cervical adenopathy.  Skin:    Findings: No erythema or rash.  Neurological:     Mental Status: She is alert and oriented to person, place, and time.     Motor: No abnormal muscle tone.     Coordination: Coordination normal.  Psychiatric:        Behavior: Behavior normal.     ED Results / Procedures / Treatments   Labs (all labs ordered are listed, but only abnormal results are displayed) Labs Reviewed  CBC WITH DIFFERENTIAL/PLATELET - Abnormal; Notable for the following components:      Result Value   RBC 3.45 (*)    Hemoglobin 10.8 (*)    HCT 34.5 (*)    Lymphs Abs 4.3 (*)    Eosinophils Absolute 0.6 (*)    All other components within normal limits  COMPREHENSIVE METABOLIC PANEL WITH GFR - Abnormal; Notable for the following components:   Sodium 134 (*)    CO2 20 (*)    Glucose, Bld 134 (*)    Creatinine, Ser 1.03 (*)    Total Protein 6.0 (*)    GFR,  Estimated 56 (*)    All other components within normal limits  URINALYSIS, ROUTINE W REFLEX MICROSCOPIC - Abnormal; Notable for the following components:   Color, Urine STRAW (*)    All other components within normal limits    EKG None  Radiology CT Head Wo Contrast Result Date: 12/04/2023 CLINICAL DATA:  Memory loss.  Weakness and tremors for several days. EXAM: CT HEAD WITHOUT CONTRAST TECHNIQUE: Contiguous axial images were obtained  from the base of the skull through the vertex without intravenous contrast. RADIATION DOSE REDUCTION: This exam was performed according to the departmental dose-optimization program which includes automated exposure control, adjustment of the mA and/or kV according to patient size and/or use of iterative reconstruction technique. COMPARISON:  CT head without contrast 08/02/2015 FINDINGS: Brain: No acute infarct, hemorrhage, or mass lesion is present. Mild atrophy and white matter changes are within normal limits for age. Insular ribbon is normal bilaterally. A remote lacunar infarct is present the posterior right lentiform nucleus. The deep brain nuclei are otherwise within normal limits. The brainstem and cerebellum are within normal limits. Midline structures are within normal limits. Vascular: No hyperdense vessel or unexpected calcification. Skull: Calvarium is intact. No focal lytic or blastic lesions are present. No significant extracranial soft tissue lesion is present. Sinuses/Orbits: The paranasal sinuses and mastoid air cells are clear. The globes and orbits are within normal limits. IMPRESSION: 1. No acute intracranial abnormality or significant interval change. 2. Mild atrophy and white matter changes are within normal limits for age. 3. 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 Electronically Signed   By: Audree Leas M.D.   On: 12/04/2023 18:22    Procedures Procedures    Medications Ordered in ED Medications  sodium  chloride 0.9 % bolus 1,000 mL (0 mLs Intravenous Stopped 12/04/23 2021)    ED Course/ Medical Decision Making/ A&P                                 Medical Decision Making Amount and/or Complexity of Data Reviewed Labs: ordered. Radiology: ordered.  This patient presents to the ED for concern of general weakness, this involves an extensive number of treatment options, and is a complaint that carries with it a high risk of complications and morbidity.  The differential diagnosis includes worsening or spinal disease, infection, hyponatremia   Co morbidities that complicate the patient evaluation  History of hyponatremia and lumbar spinal disease   Additional history obtained:  Additional history obtained from family External records from outside source obtained and reviewed including hospital records   Lab Tests:  I Ordered, and personally interpreted labs.  The pertinent results include: Sodium 134   Imaging Studies ordered:  I ordered imaging studies including CT head I independently visualized and interpreted imaging which showed remote lacunar infarct I agree with the radiologist interpretation   Cardiac Monitoring: / EKG:  The patient was maintained on a cardiac monitor.  I personally viewed and interpreted the cardiac monitored which showed an underlying rhythm of: Normal sinus rhythm   Problem List / ED Course / Critical interventions / Medication management  General Weakness, spinal disease, remote lacunar infarct I ordered medication including normal saline for dehydration Reevaluation of the patient after these medicines showed that the patient improved I have reviewed the patients home medicines and have made adjustments as needed   Consultations Obtained:  No consultant  Social Determinants of Health:  None   Test / Admission - Considered:  None  Patient with general weakness.  Suspect dehydration.  She has been given some normal saline and her  weakness is improved some.  CT scan did show old lacunar infarct.  She will follow-up with her family doctor for the weakness and the infarct seen on CT        Final Clinical Impression(s) / ED Diagnoses Final diagnoses:  Weakness  Rx / DC Orders ED Discharge Orders     None         Cheyenne Cotta, MD 12/05/23 1059

## 2023-12-10 ENCOUNTER — Ambulatory Visit (HOSPITAL_BASED_OUTPATIENT_CLINIC_OR_DEPARTMENT_OTHER): Admitting: Cardiology

## 2023-12-10 VITALS — BP 108/64 | HR 91 | Ht 63.0 in | Wt 197.0 lb

## 2023-12-10 DIAGNOSIS — R2689 Other abnormalities of gait and mobility: Secondary | ICD-10-CM | POA: Diagnosis not present

## 2023-12-10 DIAGNOSIS — R296 Repeated falls: Secondary | ICD-10-CM | POA: Diagnosis not present

## 2023-12-10 DIAGNOSIS — I2510 Atherosclerotic heart disease of native coronary artery without angina pectoris: Secondary | ICD-10-CM

## 2023-12-10 DIAGNOSIS — E785 Hyperlipidemia, unspecified: Secondary | ICD-10-CM | POA: Diagnosis not present

## 2023-12-10 DIAGNOSIS — R0602 Shortness of breath: Secondary | ICD-10-CM

## 2023-12-10 DIAGNOSIS — I10 Essential (primary) hypertension: Secondary | ICD-10-CM | POA: Diagnosis not present

## 2023-12-10 DIAGNOSIS — Z09 Encounter for follow-up examination after completed treatment for conditions other than malignant neoplasm: Secondary | ICD-10-CM | POA: Diagnosis not present

## 2023-12-10 DIAGNOSIS — R531 Weakness: Secondary | ICD-10-CM | POA: Diagnosis not present

## 2023-12-10 NOTE — Patient Instructions (Signed)
 Medication Instructions:  The current medical regimen is effective;  continue present plan and medications.  *If you need a refill on your cardiac medications before your next appointment, please call your pharmacy*  Testing/Procedures: Your physician has requested that you have an echocardiogram. Echocardiography is a painless test that uses sound waves to create images of your heart. It provides your doctor with information about the size and shape of your heart and how well your heart's chambers and valves are working. This procedure takes approximately one hour. There are no restrictions for this procedure. Please do NOT wear cologne, perfume, aftershave, or lotions (deodorant is allowed). Please arrive 15 minutes prior to your appointment time.  Please note: We ask at that you not bring children with you during ultrasound (echo/ vascular) testing. Due to room size and safety concerns, children are not allowed in the ultrasound rooms during exams. Our front office staff cannot provide observation of children in our lobby area while testing is being conducted. An adult accompanying a patient to their appointment will only be allowed in the ultrasound room at the discretion of the ultrasound technician under special circumstances. We apologize for any inconvenience.   Follow-Up: At [facility], you and your health needs are our priority.  As part of our continuing mission to provide you with exceptional heart care, our providers are all part of one team.  This team includes your primary Cardiologist (physician) and Advanced Practice Providers or APPs (Physician Assistants and Nurse Practitioners) who all work together to provide you with the care you need, when you need it.  Your next appointment:   Follow up will be based on the results of the above testing.   We recommend signing up for the patient portal called "MyChart".  Sign up information is provided on this After Visit  Summary.  MyChart is used to connect with patients for Virtual Visits (Telemedicine).  Patients are able to view lab/test results, encounter notes, upcoming appointments, etc.  Non-urgent messages can be sent to your provider as well.   To learn more about what you can do with MyChart, go to [email or web address].        1st Floor: - Lobby - Registration  - Pharmacy  - Lab - Cafe  2nd Floor: - PV Lab - Diagnostic Testing (echo, CT, nuclear med)  3rd Floor: - Vacant  4th Floor: - TCTS (cardiothoracic surgery) - AFib Clinic - Structural Heart Clinic - Vascular Surgery  - Vascular Ultrasound  5th Floor: - HeartCare Cardiology (general and EP) - Clinical Pharmacy for coumadin, hypertension, lipid, weight-loss medications, and med management appointments    Valet parking services will be available as well.

## 2023-12-10 NOTE — Progress Notes (Signed)
 Cardiology Office Note:  .   Date:  12/10/2023  ID:  [name] [name], DOB 26-Nov-1945, MRN [id number] PCP: Imelda Man, MD  [facility] Providers Cardiologist:  Dorothye Gathers, MD     History of Present Illness: .   [name] [name] is a 78 y.o. female Discussed the use of AI scribe software for clinical note transcription with the patient, who gave verbal consent to proceed.  History of Present Illness [name] [name] is a 78 year old female with nonobstructive coronary disease and hyperlipidemia who presents with weakness.  She experiences significant weakness, to the extent that she can barely walk. She visited urgent care on Friday night and was subsequently sent to the emergency room where she received fluids for dehydration. She consumes half a gallon of decaf, unsweetened tea daily, which may contribute to her dehydration.  Her medical history includes nonobstructive coronary disease with a coronary CTA in 2020 showing a calcium score of 12, placing her in the 40th percentile at the time, with no evidence of flow-limiting coronary disease. She has small areas of proximal LAD calcification. She continues to take aspirin 81 mg daily and rosuvastatin 5 mg daily.  She has a history of chronic lymphocytic leukemia (CLL) and takes oral chemotherapy, Calquence, which she will need to continue for life. Her white cell count is monitored regularly.  She has experienced hyponatremia in the past, with sodium levels as low as 123, but it has improved to 134 recently. She associates previous episodes of weakness in her legs and arms with low sodium levels.  Her past medical history is significant for three back surgeries, five cancers, arthritis, fibromyalgia, degenerative disc disease, and neuropathy. She reports that neuropathy in her feet affects her balance and has led to falls, resulting in fractures of her pelvic bone, arm, and toe.  She recalls being hospitalized last year for  hyponatremia and also had RSV and pneumonia during one of her hospital stays.     Studies Reviewed: Aaron Aas   EKG Interpretation Date/Time:  Thursday December 10 2023 14:50:34 EDT Ventricular Rate:  96 PR Interval:  168 QRS Duration:  70 QT Interval:  338 QTC Calculation: 427 R Axis:   21  Text Interpretation: Normal sinus rhythm Normal ECG When compared with ECG of 19-Dec-2022 14:43, No significant change since last tracing Confirmed by Dorothye Gathers ([id number]) on 12/10/2023 3:10:28 PM    Results LABS Na: 134  RADIOLOGY Coronary CTA: Calcium score of 12, fortieth percentile, no evidence of flow-limiting coronary disease, small areas of proximal LAD calcification (2020)  DIAGNOSTIC Echocardiogram: Normal left ventricular ejection fraction (2020)      Physical Exam:   VS:  BP 108/64   Pulse 91   Ht 5\' 3"  (1.6 m)   Wt 197 lb (89.4 kg)   LMP 08/26/1995   SpO2 98%   BMI 34.90 kg/m    Wt Readings from Last 3 Encounters:  12/10/23 197 lb (89.4 kg)  12/04/23 190 lb (86.2 kg)  09/24/23 194 lb 6.4 oz (88.2 kg)    GEN: Well nourished, well developed in no acute distress NECK: No JVD; No carotid bruits CARDIAC: RRR, no murmurs, no rubs, no gallops RESPIRATORY:  Clear to auscultation without rales, wheezing or rhonchi  ABDOMEN: Soft, non-tender, non-distended EXTREMITIES:  No edema; No deformity   ASSESSMENT AND PLAN: .    Assessment and Plan Assessment & Plan Nonobstructive coronary artery disease Nonobstructive coronary artery disease with a calcium score of 12 in  the 40th percentile as of 2020. No evidence of flow-limiting coronary disease, with mild coronary calcification. Normal heart pump function on echocardiogram in 2020. Re-evaluation with echocardiogram considered to ensure no changes in heart function contributing to symptoms. - Continue aspirin 81 mg daily - Continue rosuvastatin 5 mg daily  Weakness Significant weakness affecting ambulation. Recent urgent care and  emergency room visits revealed dehydration, treated with fluids. No cardiology-related cause identified. Sodium level improved to 134 from previous hyponatremia. Weakness may be related to spinal disease and neuropathy, affecting balance and leading to falls. - Order echocardiogram to assess heart function - Encourage home physical therapy and exercise regimen to improve strength, particularly upper body strength for back muscles  Hyponatremia Hyponatremia with past levels as low as 123. Recent improvement to 134. Dehydration may have contributed to recent weakness and hospital visit. Advised to increase water intake as decaf and unsweetened beverages may not adequately hydrate. - Encourage increased water intake to prevent dehydration  Neuropathy Severe neuropathy in feet, affecting balance and contributing to falls and injuries, including a fractured pelvic bone and broken arm. - Encourage balance exercises as part of physical therapy  Chronic lymphocytic leukemia (CLL) Chronic lymphocytic leukemia on oral chemotherapy with Calquence, required for life. White cell count is being monitored. - Continue Calquence as prescribed - Monitor white cell count  Follow-up Echocardiogram results will be communicated once available. Further management will be considered if echocardiogram shows unexpected findings. - Inform her of echocardiogram results when available - Discuss further management if echocardiogram shows unexpected findings         Signed, Dorothye Gathers, MD

## 2023-12-15 ENCOUNTER — Other Ambulatory Visit: Payer: Self-pay

## 2023-12-16 ENCOUNTER — Other Ambulatory Visit: Payer: Self-pay

## 2023-12-21 ENCOUNTER — Other Ambulatory Visit (HOSPITAL_COMMUNITY): Payer: Self-pay

## 2023-12-23 ENCOUNTER — Telehealth: Payer: Self-pay | Admitting: Hematology and Oncology

## 2023-12-24 ENCOUNTER — Other Ambulatory Visit: Payer: Medicare Other

## 2023-12-24 ENCOUNTER — Ambulatory Visit: Payer: Medicare Other | Admitting: Hematology and Oncology

## 2023-12-25 ENCOUNTER — Inpatient Hospital Stay

## 2023-12-25 ENCOUNTER — Inpatient Hospital Stay: Admitting: Hematology and Oncology

## 2023-12-25 ENCOUNTER — Telehealth: Payer: Self-pay | Admitting: Hematology and Oncology

## 2023-12-25 ENCOUNTER — Telehealth: Payer: Self-pay | Admitting: *Deleted

## 2023-12-25 NOTE — Progress Notes (Signed)
 Rescheduled

## 2023-12-25 NOTE — Telephone Encounter (Signed)
 TCT patient as she cancelled her appt today as she was not feeling well. Spoke with her. She sates she has been having some jerking movements with her hands and legs at times. Other times she is fine. She does state she is fatigued and c/o weakness. She last has labs on 12/04/23 and they were mostly within normal limits. Advised her HGB was done a bit and that can add to her fatigue.  Denies fever, chills-any other symptom. Advised to call her PCP if the jerking returns as she may need to be sen for this. Pt voiced understanding. Scheduling message sent to have her labs and clinic visit rescheduled.

## 2023-12-26 ENCOUNTER — Encounter (HOSPITAL_BASED_OUTPATIENT_CLINIC_OR_DEPARTMENT_OTHER): Payer: Self-pay | Admitting: Emergency Medicine

## 2023-12-26 ENCOUNTER — Inpatient Hospital Stay (HOSPITAL_BASED_OUTPATIENT_CLINIC_OR_DEPARTMENT_OTHER)
Admission: EM | Admit: 2023-12-26 | Discharge: 2023-12-29 | DRG: 644 | Disposition: A | Discharge status: Home Health | Attending: Internal Medicine | Admitting: Internal Medicine

## 2023-12-26 ENCOUNTER — Emergency Department (HOSPITAL_BASED_OUTPATIENT_CLINIC_OR_DEPARTMENT_OTHER)

## 2023-12-26 ENCOUNTER — Other Ambulatory Visit: Payer: Self-pay

## 2023-12-26 DIAGNOSIS — C50919 Malignant neoplasm of unspecified site of unspecified female breast: Secondary | ICD-10-CM | POA: Diagnosis not present

## 2023-12-26 DIAGNOSIS — E875 Hyperkalemia: Secondary | ICD-10-CM | POA: Diagnosis not present

## 2023-12-26 DIAGNOSIS — H9191 Unspecified hearing loss, right ear: Secondary | ICD-10-CM | POA: Diagnosis present

## 2023-12-26 DIAGNOSIS — E222 Syndrome of inappropriate secretion of antidiuretic hormone: Principal | ICD-10-CM | POA: Diagnosis present

## 2023-12-26 DIAGNOSIS — Z9221 Personal history of antineoplastic chemotherapy: Secondary | ICD-10-CM | POA: Diagnosis not present

## 2023-12-26 DIAGNOSIS — R296 Repeated falls: Secondary | ICD-10-CM | POA: Diagnosis not present

## 2023-12-26 DIAGNOSIS — E871 Hypo-osmolality and hyponatremia: Principal | ICD-10-CM | POA: Diagnosis present

## 2023-12-26 DIAGNOSIS — Z170 Estrogen receptor positive status [ER+]: Secondary | ICD-10-CM

## 2023-12-26 DIAGNOSIS — R29898 Other symptoms and signs involving the musculoskeletal system: Secondary | ICD-10-CM | POA: Diagnosis not present

## 2023-12-26 DIAGNOSIS — D509 Iron deficiency anemia, unspecified: Secondary | ICD-10-CM | POA: Diagnosis present

## 2023-12-26 DIAGNOSIS — C50511 Malignant neoplasm of lower-outer quadrant of right female breast: Secondary | ICD-10-CM | POA: Diagnosis not present

## 2023-12-26 DIAGNOSIS — C9110 Chronic lymphocytic leukemia of B-cell type not having achieved remission: Secondary | ICD-10-CM | POA: Diagnosis present

## 2023-12-26 DIAGNOSIS — Z79899 Other long term (current) drug therapy: Secondary | ICD-10-CM | POA: Diagnosis not present

## 2023-12-26 DIAGNOSIS — M797 Fibromyalgia: Secondary | ICD-10-CM | POA: Diagnosis present

## 2023-12-26 DIAGNOSIS — E039 Hypothyroidism, unspecified: Secondary | ICD-10-CM | POA: Diagnosis not present

## 2023-12-26 DIAGNOSIS — R631 Polydipsia: Secondary | ICD-10-CM | POA: Diagnosis present

## 2023-12-26 DIAGNOSIS — I10 Essential (primary) hypertension: Secondary | ICD-10-CM | POA: Diagnosis present

## 2023-12-26 DIAGNOSIS — Z85828 Personal history of other malignant neoplasm of skin: Secondary | ICD-10-CM | POA: Diagnosis not present

## 2023-12-26 DIAGNOSIS — Z8616 Personal history of COVID-19: Secondary | ICD-10-CM | POA: Diagnosis not present

## 2023-12-26 DIAGNOSIS — R531 Weakness: Secondary | ICD-10-CM

## 2023-12-26 DIAGNOSIS — Z79890 Hormone replacement therapy (postmenopausal): Secondary | ICD-10-CM

## 2023-12-26 DIAGNOSIS — E785 Hyperlipidemia, unspecified: Secondary | ICD-10-CM | POA: Diagnosis present

## 2023-12-26 DIAGNOSIS — Z1152 Encounter for screening for COVID-19: Secondary | ICD-10-CM

## 2023-12-26 DIAGNOSIS — I2510 Atherosclerotic heart disease of native coronary artery without angina pectoris: Secondary | ICD-10-CM | POA: Diagnosis present

## 2023-12-26 DIAGNOSIS — G629 Polyneuropathy, unspecified: Secondary | ICD-10-CM | POA: Diagnosis present

## 2023-12-26 DIAGNOSIS — Z9842 Cataract extraction status, left eye: Secondary | ICD-10-CM

## 2023-12-26 DIAGNOSIS — K219 Gastro-esophageal reflux disease without esophagitis: Secondary | ICD-10-CM | POA: Diagnosis present

## 2023-12-26 DIAGNOSIS — Z7951 Long term (current) use of inhaled steroids: Secondary | ICD-10-CM

## 2023-12-26 DIAGNOSIS — D72829 Elevated white blood cell count, unspecified: Secondary | ICD-10-CM | POA: Insufficient documentation

## 2023-12-26 DIAGNOSIS — Z9841 Cataract extraction status, right eye: Secondary | ICD-10-CM

## 2023-12-26 DIAGNOSIS — F32A Depression, unspecified: Secondary | ICD-10-CM | POA: Diagnosis not present

## 2023-12-26 DIAGNOSIS — Z7982 Long term (current) use of aspirin: Secondary | ICD-10-CM

## 2023-12-26 LAB — COMPREHENSIVE METABOLIC PANEL WITH GFR
ALT: 48 U/L — ABNORMAL HIGH (ref 0–44)
AST: 46 U/L — ABNORMAL HIGH (ref 15–41)
Albumin: 4.5 g/dL (ref 3.5–5.0)
Alkaline Phosphatase: 109 U/L (ref 38–126)
Anion gap: 12 (ref 5–15)
BUN: 16 mg/dL (ref 8–23)
CO2: 21 mmol/L — ABNORMAL LOW (ref 22–32)
Calcium: 9.8 mg/dL (ref 8.9–10.3)
Chloride: 92 mmol/L — ABNORMAL LOW (ref 98–111)
Creatinine, Ser: 0.98 mg/dL (ref 0.44–1.00)
GFR, Estimated: 59 mL/min — ABNORMAL LOW (ref >60)
Glucose, Bld: 99 mg/dL (ref 70–99)
Potassium: 5.7 mmol/L — ABNORMAL HIGH (ref 3.5–5.1)
Sodium: 125 mmol/L — ABNORMAL LOW (ref 135–145)
Total Bilirubin: 0.3 mg/dL (ref 0.0–1.2)
Total Protein: 6.2 g/dL — ABNORMAL LOW (ref 6.5–8.1)

## 2023-12-26 LAB — MAGNESIUM: Magnesium: 2.1 mg/dL (ref 1.7–2.4)

## 2023-12-26 LAB — CBC WITH DIFFERENTIAL/PLATELET
Abs Immature Granulocytes: 0.05 10*3/uL (ref 0.00–0.07)
Basophils Absolute: 0.1 10*3/uL (ref 0.0–0.1)
Basophils Relative: 1 %
Eosinophils Absolute: 0.3 10*3/uL (ref 0.0–0.5)
Eosinophils Relative: 2 %
HCT: 34.1 % — ABNORMAL LOW (ref 36.0–46.0)
Hemoglobin: 11.6 g/dL — ABNORMAL LOW (ref 12.0–15.0)
Immature Granulocytes: 0 %
Lymphocytes Relative: 40 %
Lymphs Abs: 5.2 10*3/uL — ABNORMAL HIGH (ref 0.7–4.0)
MCH: 32.3 pg (ref 26.0–34.0)
MCHC: 34 g/dL (ref 30.0–36.0)
MCV: 95 fL (ref 80.0–100.0)
Monocytes Absolute: 0.8 10*3/uL (ref 0.1–1.0)
Monocytes Relative: 6 %
Neutro Abs: 6.4 10*3/uL (ref 1.7–7.7)
Neutrophils Relative %: 51 %
Platelets: 232 10*3/uL (ref 150–400)
RBC: 3.59 MIL/uL — ABNORMAL LOW (ref 3.87–5.11)
RDW: 13.4 % (ref 11.5–15.5)
WBC: 12.9 10*3/uL — ABNORMAL HIGH (ref 4.0–10.5)
nRBC: 0 % (ref 0.0–0.2)

## 2023-12-26 LAB — TROPONIN T, HIGH SENSITIVITY
Troponin T High Sensitivity: 22 ng/L — ABNORMAL HIGH (ref <19)
Troponin T High Sensitivity: 19 ng/L — ABNORMAL HIGH (ref <19)

## 2023-12-26 LAB — RESP PANEL BY RT-PCR (RSV, FLU A&B, COVID)  RVPGX2
Influenza A by PCR: NEGATIVE
Influenza B by PCR: NEGATIVE
Resp Syncytial Virus by PCR: NEGATIVE
SARS Coronavirus 2 by RT PCR: NEGATIVE

## 2023-12-26 LAB — TSH: TSH: 2.26 u[IU]/mL (ref 0.350–4.500)

## 2023-12-26 LAB — LACTIC ACID, PLASMA: Lactic Acid, Venous: 1.3 mmol/L (ref 0.5–1.9)

## 2023-12-26 LAB — PRO BRAIN NATRIURETIC PEPTIDE: Pro Brain Natriuretic Peptide: 40.4 pg/mL (ref <300.0)

## 2023-12-26 MED ORDER — ROSUVASTATIN CALCIUM 5 MG PO TABS
5.0000 mg | ORAL_TABLET | Freq: Every day | ORAL | Status: DC
  Administered 2023-12-27 – 2023-12-29 (×3): 5 mg via ORAL
  Filled 2023-12-26 (×3): qty 1

## 2023-12-26 MED ORDER — LEVOTHYROXINE SODIUM 100 MCG PO TABS
100.0000 ug | ORAL_TABLET | Freq: Every day | ORAL | Status: DC
  Administered 2023-12-27 – 2023-12-29 (×3): 100 ug via ORAL
  Filled 2023-12-26 (×3): qty 1

## 2023-12-26 MED ORDER — ACALABRUTINIB MALEATE 100 MG PO TABS: 100.0000 mg | ORAL_TABLET | Freq: Two times a day (BID) | ORAL | Status: DC

## 2023-12-26 MED ORDER — FLUVOXAMINE MALEATE 100 MG PO TABS
100.0000 mg | ORAL_TABLET | Freq: Every day | ORAL | Status: DC
  Administered 2023-12-27 (×2): 100 mg via ORAL
  Filled 2023-12-26 (×3): qty 1

## 2023-12-26 MED ORDER — ASPIRIN 81 MG PO TBEC
81.0000 mg | DELAYED_RELEASE_TABLET | Freq: Every day | ORAL | Status: DC
  Administered 2023-12-27 – 2023-12-29 (×3): 81 mg via ORAL
  Filled 2023-12-26 (×3): qty 1

## 2023-12-26 MED ORDER — BREXPIPRAZOLE 1 MG PO TABS
1.0000 mg | ORAL_TABLET | Freq: Every day | ORAL | Status: DC
  Administered 2023-12-27 – 2023-12-29 (×3): 1 mg via ORAL
  Filled 2023-12-26 (×3): qty 1

## 2023-12-26 MED ORDER — AMITRIPTYLINE HCL 50 MG PO TABS
100.0000 mg | ORAL_TABLET | Freq: Every day | ORAL | Status: DC
  Administered 2023-12-27 (×2): 100 mg via ORAL
  Filled 2023-12-26 (×2): qty 2

## 2023-12-26 MED ORDER — ALLOPURINOL 300 MG PO TABS
300.0000 mg | ORAL_TABLET | Freq: Every day | ORAL | Status: DC
  Administered 2023-12-27 – 2023-12-29 (×3): 300 mg via ORAL
  Filled 2023-12-26 (×3): qty 1

## 2023-12-26 MED ORDER — FERROUS SULFATE 325 (65 FE) MG PO TABS
325.0000 mg | ORAL_TABLET | Freq: Every day | ORAL | Status: DC
  Administered 2023-12-27 – 2023-12-29 (×3): 325 mg via ORAL
  Filled 2023-12-26 (×3): qty 1

## 2023-12-26 MED ORDER — PANTOPRAZOLE SODIUM 40 MG PO TBEC
40.0000 mg | DELAYED_RELEASE_TABLET | Freq: Every day | ORAL | Status: DC
  Administered 2023-12-27 – 2023-12-29 (×3): 40 mg via ORAL
  Filled 2023-12-26 (×3): qty 1

## 2023-12-26 MED ORDER — GABAPENTIN 100 MG PO CAPS
200.0000 mg | ORAL_CAPSULE | Freq: Two times a day (BID) | ORAL | Status: DC
  Administered 2023-12-27 – 2023-12-29 (×6): 200 mg via ORAL
  Filled 2023-12-26 (×5): qty 2

## 2023-12-26 MED ORDER — VITAMIN B-12 1000 MCG PO TABS
1000.0000 ug | ORAL_TABLET | Freq: Every day | ORAL | Status: DC
  Administered 2023-12-27 – 2023-12-29 (×3): 1000 ug via ORAL
  Filled 2023-12-26 (×3): qty 1

## 2023-12-26 MED ORDER — BUPROPION HCL ER (XL) 150 MG PO TB24
300.0000 mg | ORAL_TABLET | Freq: Every day | ORAL | Status: DC
  Administered 2023-12-27 – 2023-12-29 (×3): 300 mg via ORAL
  Filled 2023-12-26 (×3): qty 2

## 2023-12-26 MED ORDER — SODIUM CHLORIDE 0.9 % IV SOLN: INTRAVENOUS | Status: AC

## 2023-12-26 MED ORDER — ENOXAPARIN SODIUM 40 MG/0.4ML IJ SOSY
40.0000 mg | PREFILLED_SYRINGE | Freq: Every day | INTRAMUSCULAR | Status: DC
  Administered 2023-12-27 – 2023-12-29 (×3): 40 mg via SUBCUTANEOUS
  Filled 2023-12-26 (×3): qty 0.4

## 2023-12-26 NOTE — ED Notes (Signed)
 Attempted x1 for report to 23M.

## 2023-12-26 NOTE — ED Provider Notes (Signed)
 [place] [facility] Provider Note   CSN: [id number] Arrival date & time: 12/26/23  1434     History {Add pertinent medical, surgical, social history, OB history to HPI:1} Chief Complaint  Patient presents with   Weakness   Trembling    ARKEISHA PALLARES is a 78 y.o. female.  HPI     78yo female with history of hypothyroidism, hyperlipidemia, CLL, remote breast cancer, nonobstructive CAD, neuropathy, degenerative disc disease with history of lumbar and thoracic surgeries,   Has had episodes of generalized weakness in the past, some shakiness with trying to walk when her sodium was low and also when she had COVID  Over the last month has been waxing and waning, some days are better but this week has been worse, fell at least 2 times this week.  Stands up, feels like lightheaded, shakiness, weak all over, shakiness not like seizure but more weakness  No fever, no new cough (does have chronic cough) bronchiectasis, no n/v/d/black or bloody stools, sore throat, runny nose, no headaches,  is having back pain but feels it was better after surgery last year, no numbness or weakness, eye doctor states macular degeneration. Does feel off balance but doesn't feel like going to pass out. More weakness, shaking, legs trembling. Sometimes will forget what she is talking about  No head trauma. No new medications.    On acalabrutinib  100mg  BID for CLL Saw Cardiology for weakness--felt it was less likely cardiac etiology, ECHO was ordered but not completed yet Prior to that was in ED 4/12 for the same, had CT head, fluid and felt improved  Past Medical History:  Diagnosis Date   Alopecia    Anxiety    Arthritis    "spine" (07/28/2013)   Breast cancer (HCC) 1997; 2014   right   CAP (community acquired pneumonia)    admission 06-04-2013, failed outpatient   Chronic back pain    "lower back and upper neck" (07/28/2013)   CLL (chronic lymphocytic leukemia)  (HCC) 05/2013   DDD (degenerative disc disease)    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 ear    Depression    Diverticulosis    Elevated LFTs    Fibromyalgia 1978   GERD (gastroesophageal reflux disease)    Hematuria    History of syncope    episode 2008--  no recurrence since   HLD (hyperlipidemia)    Hypothyroidism    Kidney stones 2014   Plantar fasciitis    Ruptured disk    one in neck and two in back   S/P chemotherapy, time since greater than 12 weeks    Sinus tachycardia    mild resting   Skin cancer    nose   Stool incontinence    "at times recently"     Past Surgical History:  Procedure Laterality Date   ANTERIOR LATERAL LUMBAR FUSION 4 LEVELS Left 04/25/2016   Procedure: LEFT LUMBAR ONE-TWO, LUMBAR TWO-THREE, LUMBAR THREE-FOUR, LUMBAR FOUR-FIVE ANTERIOR LATERAL LUMBAR FUSION;  Surgeon: [name], MD;  Location: [facility];  Service: Neurosurgery;  Laterality: Left;   APPENDECTOMY  1997   APPLICATION OF ROBOTIC ASSISTANCE FOR SPINAL PROCEDURE N/A 04/06/2017   Procedure: APPLICATION OF ROBOTIC ASSISTANCE FOR SPINAL PROCEDURE;  Surgeon: [name], MD;  Location: [facility];  Service: Neurosurgery;  Laterality: N/A;   BREAST BIOPSY Right 2014   BREAST LUMPECTOMY Right 1997   BREAST LUMPECTOMY WITH AXILLARY LYMPH NODE DISSECTION Right 1997   CATARACT EXTRACTION, BILATERAL  2019  Dr. Reyne Cave   CYSTOSCOPY WITH RETROGRADE PYELOGRAM, URETEROSCOPY AND STENT PLACEMENT Bilateral 06/17/2013   Procedure: CYSTOSCOPY WITH RETROGRADE PYELOGRAM, URETEROSCOPY AND LEFT DOUBLE  J STENT PLACEMENT RIGHT URETERAL HOLMIIUM LASER AND DOUBLE J STENT ;  Surgeon: Jinny Mounts, MD;  Location: [facility];  Service: Urology;  Laterality: Bilateral;   HOLMIUM LASER APPLICATION Bilateral 06/17/2013   Procedure: HOLMIUM LASER APPLICATION;  Surgeon: Jinny Mounts, MD;  Location: [facility];  Service: Urology;  Laterality: Bilateral;   LAMINECTOMY WITH POSTERIOR LATERAL ARTHRODESIS LEVEL  3 N/A 02/02/2023   Procedure: Thoracic six - Thoracic nine Posterior lateral fusion;  Surgeon: [name], MD;  Location: [facility] OR;  Service: Neurosurgery;  Laterality: N/A;   LITHOTRIPSY Left 06/2013   LUMBAR EPIDURAL INJECTION     has had 7 injections   PORT-A-CATH REMOVAL Left 10/03/2014   Procedure: REMOVAL PORT-A-CATH;  Surgeon: Dareen Ebbing, MD;  Location: [facility];  Service: General;  Laterality: Left;   PORTACATH PLACEMENT Left 07/28/2013   Procedure: ATTEMPTED INSERTION PORT-A-CATH;  Surgeon: Kari Otto. Eli Grizzle, MD;  Location: [facility];  Service: General;  Laterality: Left;   POSTERIOR LUMBAR FUSION 4 LEVEL N/A 04/25/2016   Procedure: LUMBAR FIVE-SACRAL ONE POSTERIOR LUMBAR INTERBODY FUSION, THORACIC NINE-SACRAL ONE POSTERIOR LATERAL ARTHRODESIS WITH PEDICLE SCREWS;  Surgeon: [name], MD;  Location: [facility];  Service: Neurosurgery;  Laterality: N/A;   RETINAL DETACHMENT SURGERY Right 2013   ROBOTIC ASSITED PARTIAL NEPHRECTOMY Right 02/05/2015   Procedure: ROBOTIC ASSITED PARTIAL NEPHRECTOMY;  Surgeon: Florencio Hunting, MD;  Location: [facility];  Service: Urology;  Laterality: Right;   SIMPLE MASTECTOMY WITH AXILLARY SENTINEL NODE BIOPSY Right 07/28/2013   Procedure: RIGHT TOTAL  MASTECTOMY;  Surgeon: Kari Otto. Eli Grizzle, MD;  Location: [facility];  Service: General;  Laterality: Right;   TONSILLECTOMY  AGE 40   TOTAL ABDOMINAL HYSTERECTOMY W/ BILATERAL SALPINGOOPHORECTOMY  1997    Home Medications Prior to Admission medications   Medication Sig Start Date End Date Taking? Authorizing Provider  acalabrutinib  maleate (CALQUENCE ) 100 MG tablet Take 1 tablet (100 mg total) by mouth 2 (two) times daily. 11/03/23   Ander Bame, MD  allopurinol  (ZYLOPRIM ) 300 MG tablet TAKE 1 TABLET(300 MG) BY MOUTH DAILY 11/10/23   [name], [name], MD  amitriptyline  (ELAVIL ) 100 MG tablet Take 100 mg by mouth at bedtime.    [provider]  aspirin  81 MG EC tablet Take 81 mg by mouth daily.     [provider]  brexpiprazole  (REXULTI ) 1 MG TABS tablet Take 1 mg by mouth daily.    [provider]  budesonide -formoterol  (SYMBICORT ) 160-4.5 MCG/ACT inhaler Inhale 2 puffs into the lungs 2 (two) times daily. 09/09/23 09/08/24  [name], [name], MD  buPROPion  (WELLBUTRIN  XL) 300 MG 24 hr tablet Take 300 mg by mouth daily. 08/30/19   [provider]  Cholecalciferol  (VITAMIN D3) 50 MCG (2000 UT) TABS Take 2,000 Units by mouth daily.    [provider]  COLLAGEN PO Take 4 tablets by mouth daily. Vital protein gummy    [provider]  Cyanocobalamin  (VITAMIN B12) 1000 MCG TBCR Take 1,000 Units by mouth daily.    [provider]  ferrous sulfate  325 (65 FE) MG tablet Take 325 mg by mouth daily with breakfast.    [provider]  fluvoxaMINE  (LUVOX ) 50 MG tablet Take 100 mg by mouth at bedtime. 09/27/22   [provider]  gabapentin  (NEURONTIN ) 100 MG  capsule Take 200 mg by mouth 2 (two) times daily. 08/20/21   [provider]  ibuprofen  (ADVIL ) 200 MG tablet Take 600 mg by mouth in the morning.    [provider]  levothyroxine  (SYNTHROID , LEVOTHROID) 88 MCG tablet Take 88 mcg by mouth daily before breakfast. 06/30/17   [provider]  Multiple Vitamins-Minerals (WOMENS 50+ MULTI VITAMIN/MIN) TABS Take 1 tablet by mouth daily.    [provider]  omeprazole  (PRILOSEC) 40 MG capsule TAKE 1 CAPSULE(40 MG) BY MOUTH DAILY AFTER BREAKFAST 11/10/23   [name], [name], MD  oxyCODONE  10 MG TABS Take 1 tablet (10 mg total) by mouth every 3 (three) hours as needed for severe pain ((score 7 to 10)). Patient not taking: Reported on 12/10/2023 02/03/23   [name], MD  rosuvastatin  (CRESTOR ) 5 MG tablet TAKE 1 TABLET(5 MG) BY MOUTH DAILY 09/23/23   [name], MD      Allergies    Furosemide, Lithium, Sulfa antibiotics, Trazodone and nefazodone, and Lyrica [pregabalin]    Review of Systems   Review of  Systems  Physical Exam Updated Vital Signs Ht 5\' 3"  (1.6 m)   Wt 88.5 kg   LMP 08/26/1995   BMI 34.54 kg/m  Physical Exam  ED Results / Procedures / Treatments   Labs (all labs ordered are listed, but only abnormal results are displayed) Labs Reviewed - No data to display  EKG None  Radiology No results found.  Procedures Procedures  {Document cardiac monitor, telemetry assessment procedure when appropriate:1}  Medications Ordered in ED Medications - No data to display  ED Course/ Medical Decision Making/ A&P   {   Click here for ABCD2, HEART and other calculatorsREFRESH Note before signing :1}                              Medical Decision Making Amount and/or Complexity of Data Reviewed Labs: ordered. Radiology: ordered.   ***   Labs and personally evaluated interpreted by me show hemoglobin 11.6, similar to prior use.  Mild leukocytosis which is also similar to prior.  CMP completed shows hyponatremia with a sodium of 125, decreased from 134 April 11, potassium mildly elevated at 5.7, bicarb of 21, mildly elevated transaminases.  BNP is within normal limits.  Her troponin is mildly elevated to 22.  Magnesium  within normal limits.  Given she had had similar symptoms when she has had COVID in the past, ordered COVID, flu RSV testing which were negative.  Lactic acid ordered with possibility of sepsis as cause of her generalized weakness is within normal limits.  Her TSH is normal.  Her chest x-ray was completed and personally evaluated interpreted by me showed no evidence of pneumonia, pneumothorax or significant pulmonary edema.  Suspect her weakness, shakiness, frequent falls at this time are secondary to symptomatic hyponatremia.   {Document critical care time when appropriate:1} {Document review of labs and clinical decision tools ie heart score, Chads2Vasc2 etc:1}  {Document your independent review of radiology images, and any outside records:1} {Document  your discussion with family members, caretakers, and with consultants:1} {Document social determinants of health affecting pt's care:1} {Document your decision making why or why not admission, treatments were needed:1} Final Clinical Impression(s) / ED Diagnoses Final diagnoses:  None    Rx / DC Orders ED Discharge Orders     None

## 2023-12-26 NOTE — Plan of Care (Signed)
   Problem: Education: Goal: Knowledge of General Education information will improve Description: Including pain rating scale, medication(s)/side effects and non-pharmacologic comfort measures Outcome: Progressing   Problem: Clinical Measurements: Goal: Respiratory complications will improve Outcome: Progressing   Problem: Nutrition: Goal: Adequate nutrition will be maintained Outcome: Progressing

## 2023-12-26 NOTE — H&P (Signed)
 History and Physical    [name] [name] WGN:[id number] DOB: 27-Jul-1946 DOA: 12/26/2023  Patient coming from: Home.  Chief Complaint: Weakness.  HPI: [name] [name] is a 78 y.o. female with history of CLL, hyperlipidemia, hypothyroidism, GERD, depression presents to the ER with weakness and difficulty ambulating.  Patient states her symptoms has been ongoing for last 3 days with finding it difficult to ambulate with legs giving away.  She has chronic numbness of both lower extremities.  Patient had similar picture in April of last year when she had hyponatremia.  Patient admits to drinking a gallon of unsweetened tea every day.  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  Denies any nausea vomiting diarrhea loss of consciousness or any addition of new medications recently.  Patient had gone to the ER on 12/04/23 with weakness at that time sodium was 134 and patient was given fluid bolus following which symptoms improved and states she was doing fine until last 3 days.  ED Course: In the ER patient appears nonfocal.  Labs show sodium of 125 troponin is flat at 22.19 potassium of 5.7 AST ALT of 46 and 48 patient was started gentle fluid hydration with saline.  Admitted for further observation.  Review of Systems: As per HPI, rest all negative.   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   DDD (degenerative disc disease)    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 ear    Depression    Diverticulosis    Elevated LFTs    Fibromyalgia 1978   GERD (gastroesophageal reflux disease)    Hematuria    History of syncope    episode 2008--  no recurrence since    HLD (hyperlipidemia)    Hypothyroidism    Kidney stones 2014   Plantar fasciitis    Ruptured disk    one in neck and two in back   S/P chemotherapy, time since greater than 12 weeks    Sinus tachycardia    mild resting   Skin cancer    nose   Stool incontinence    "at times recently"     Past Surgical History:  Procedure Laterality Date   ANTERIOR LATERAL LUMBAR FUSION 4 LEVELS Left 04/25/2016   Procedure: LEFT LUMBAR ONE-TWO, LUMBAR TWO-THREE, LUMBAR THREE-FOUR, LUMBAR FOUR-FIVE ANTERIOR LATERAL LUMBAR FUSION;  Surgeon: [name], MD;  Location: [facility];  Service: Neurosurgery;  Laterality: Left;   APPENDECTOMY  1997   APPLICATION OF ROBOTIC ASSISTANCE FOR SPINAL PROCEDURE N/A 04/06/2017   Procedure: APPLICATION OF ROBOTIC ASSISTANCE FOR SPINAL PROCEDURE;  Surgeon: [name], MD;  Location: [facility] OR;  Service: Neurosurgery;  Laterality: N/A;   BREAST BIOPSY Right 2014   BREAST LUMPECTOMY Right 1997   BREAST LUMPECTOMY WITH AXILLARY LYMPH NODE DISSECTION Right 1997   CATARACT EXTRACTION, BILATERAL  2019   Dr. Reyne Cave   CYSTOSCOPY WITH RETROGRADE PYELOGRAM, URETEROSCOPY AND STENT PLACEMENT Bilateral 06/17/2013   Procedure: CYSTOSCOPY WITH RETROGRADE PYELOGRAM, URETEROSCOPY AND LEFT DOUBLE  J STENT PLACEMENT RIGHT URETERAL HOLMIIUM LASER AND DOUBLE J STENT ;  Surgeon: Jinny Mounts, MD;  Location:  [facility];  Service: Urology;  Laterality: Bilateral;   HOLMIUM LASER APPLICATION Bilateral 06/17/2013   Procedure: HOLMIUM LASER APPLICATION;  Surgeon: Jinny Mounts, MD;  Location: [facility];  Service: Urology;  Laterality: Bilateral;   LAMINECTOMY WITH POSTERIOR LATERAL ARTHRODESIS LEVEL 3 N/A 02/02/2023   Procedure: Thoracic six - Thoracic nine Posterior lateral fusion;  Surgeon: [name], MD;  Location: [facility] OR;  Service: Neurosurgery;  Laterality: N/A;   LITHOTRIPSY Left 06/2013   LUMBAR EPIDURAL INJECTION     has had 7 injections    PORT-A-CATH REMOVAL Left 10/03/2014   Procedure: REMOVAL PORT-A-CATH;  Surgeon: Dareen Ebbing, MD;  Location: [facility];  Service: General;  Laterality: Left;   PORTACATH PLACEMENT Left 07/28/2013   Procedure: ATTEMPTED INSERTION PORT-A-CATH;  Surgeon: Kari Otto. Eli Grizzle, MD;  Location: [facility] OR;  Service: General;  Laterality: Left;   POSTERIOR LUMBAR FUSION 4 LEVEL N/A 04/25/2016   Procedure: LUMBAR FIVE-SACRAL ONE POSTERIOR LUMBAR INTERBODY FUSION, THORACIC NINE-SACRAL ONE POSTERIOR LATERAL ARTHRODESIS WITH PEDICLE SCREWS;  Surgeon: [name], MD;  Location: [facility];  Service: Neurosurgery;  Laterality: N/A;   RETINAL DETACHMENT SURGERY Right 2013   ROBOTIC ASSITED PARTIAL NEPHRECTOMY Right 02/05/2015   Procedure: ROBOTIC ASSITED PARTIAL NEPHRECTOMY;  Surgeon: Florencio Hunting, MD;  Location: [facility];  Service: Urology;  Laterality: Right;   SIMPLE MASTECTOMY WITH AXILLARY SENTINEL NODE BIOPSY Right 07/28/2013   Procedure: RIGHT TOTAL  MASTECTOMY;  Surgeon: Kari Otto. Eli Grizzle, MD;  Location: [facility];  Service: General;  Laterality: Right;   TONSILLECTOMY  AGE 81   TOTAL ABDOMINAL HYSTERECTOMY W/ BILATERAL SALPINGOOPHORECTOMY  1997     reports that she has never smoked. She has never used smokeless tobacco. She reports that she does not drink alcohol and does not use drugs.  Allergies  Allergen Reactions   Furosemide Nausea And Vomiting, Other (See Comments) and Itching    HEADACHE  Other Reaction(s): GI Intolerance, Other (See Comments)   Lithium Other (See Comments)    Dizziness, "cause me to fall"   Sulfa Antibiotics Hives   Trazodone And Nefazodone Other (See Comments)    insomnia   Lyrica [Pregabalin] Diarrhea, Nausea Only and Rash    Family History  Problem Relation Age of Onset   Heart disease Maternal Grandfather    Diabetes Maternal Grandfather    Colon cancer Maternal Aunt 65   Brain cancer Paternal Grandmother        dx in 52s   Dementia Mother    Diabetes Mother     Osteoporosis Mother    Diabetes Maternal Aunt    Prostate cancer Father 71   Bipolar disorder Maternal Aunt    Stroke Maternal Aunt    Stomach cancer Paternal Uncle        dx in late 71s    Prior to Admission medications   Medication Sig Start Date End Date Taking? Authorizing Provider  acalabrutinib  maleate (CALQUENCE ) 100 MG tablet Take 1 tablet (100 mg total) by mouth 2 (two) times daily. 11/03/23   Ander Bame, MD  allopurinol  (ZYLOPRIM ) 300 MG tablet TAKE 1 TABLET(300 MG) BY MOUTH DAILY 11/10/23   [name], [name], MD  amitriptyline  (ELAVIL ) 100 MG tablet Take 100 mg by mouth at bedtime.    [provider]  aspirin  81 MG EC tablet Take 81 mg by mouth daily.    [provider]  brexpiprazole  (REXULTI ) 1 MG TABS tablet Take 1 mg by mouth  daily.    [provider]  budesonide -formoterol  (SYMBICORT ) 160-4.5 MCG/ACT inhaler Inhale 2 puffs into the lungs 2 (two) times daily. 09/09/23 09/08/24  [name], [name], MD  buPROPion  (WELLBUTRIN  XL) 300 MG 24 hr tablet Take 300 mg by mouth daily. 08/30/19   [provider]  Cholecalciferol  (VITAMIN D3) 50 MCG (2000 UT) TABS Take 2,000 Units by mouth daily.    [provider]  COLLAGEN PO Take 4 tablets by mouth daily. Vital protein gummy    [provider]  Cyanocobalamin  (VITAMIN B12) 1000 MCG TBCR Take 1,000 Units by mouth daily.    [provider]  ferrous sulfate  325 (65 FE) MG tablet Take 325 mg by mouth daily with breakfast.    [provider]  fluvoxaMINE  (LUVOX ) 50 MG tablet Take 100 mg by mouth at bedtime. 09/27/22   [provider]  gabapentin  (NEURONTIN ) 100 MG capsule Take 200 mg by mouth 2 (two) times daily. 08/20/21   [provider]  ibuprofen  (ADVIL ) 200 MG tablet Take 600 mg by mouth in the morning.    [provider]  levothyroxine  (SYNTHROID , LEVOTHROID) 88 MCG tablet Take 88 mcg by mouth daily before breakfast. 06/30/17   [provider]  Multiple Vitamins-Minerals (WOMENS 50+ MULTI VITAMIN/MIN) TABS Take 1 tablet by mouth daily.    [provider]  omeprazole  (PRILOSEC) 40 MG capsule TAKE 1 CAPSULE(40 MG) BY MOUTH DAILY AFTER BREAKFAST 11/10/23   [name], [name], MD  oxyCODONE  10 MG TABS Take 1 tablet (10 mg total) by mouth every 3 (three) hours as needed for severe pain ((score 7 to 10)). Patient not taking: Reported on 12/10/2023 02/03/23   [name], MD  rosuvastatin  (CRESTOR ) 5 MG tablet TAKE 1 TABLET(5 MG) BY MOUTH DAILY 09/23/23   [name], MD    Physical Exam: Constitutional: Moderately built and nourished. Vitals:   12/26/23 2015 12/26/23 2030 12/26/23 2045 12/26/23 2126  BP: 125/61 (!) 125/57 132/66 128/64  Pulse: 77 83 77 (!) 103  Resp: 17 18 19    Temp:    98.3 F (36.8 C)  TempSrc:    Oral  SpO2: 97% 99% 97% 99%  Weight:      Height:       Eyes: Anicteric no pallor. ENMT: No discharge from the ears eyes nose and mouth. Neck: No mass felt.  No neck rigidity. Respiratory: No rhonchi or crepitations. Cardiovascular: S1-S2 heard. Abdomen: Soft nontender bowel sound present. Musculoskeletal: No edema. Skin: No rash. Neurologic: Alert awake oriented to time place and person.  Moves all extremities 5 x 5. Psychiatric: Appears normal.  Normal affect.   Labs on Admission: I have personally reviewed following labs and imaging studies  CBC: Recent Labs  Lab 12/26/23 1619  WBC 12.9*  NEUTROABS 6.4  HGB 11.6*  HCT 34.1*  MCV 95.0  PLT 232   Basic Metabolic Panel: Recent Labs  Lab 12/26/23 1619  NA 125*  K 5.7*  CL 92*  CO2 21*  GLUCOSE 99  BUN 16  CREATININE 0.98  CALCIUM  9.8  MG 2.1   GFR: Estimated Creatinine Clearance: 49.9 mL/min (by C-G formula based on SCr of 0.98 mg/dL). Liver Function Tests: Recent Labs  Lab 12/26/23 1619  AST 46*  ALT 48*  ALKPHOS 109  BILITOT 0.3  PROT 6.2*  ALBUMIN  4.5   No results for input(s): "LIPASE", "AMYLASE" in  the last 168 hours. No results for input(s): "AMMONIA" in the last 168 hours. Coagulation  Profile: No results for input(s): "INR", "PROTIME" in the last 168 hours. Cardiac Enzymes: No results for input(s): "CKTOTAL", "CKMB", "CKMBINDEX", "TROPONINI" in the last 168 hours. BNP (last 3 results) Recent Labs    12/26/23 1619  PROBNP 40.4   HbA1C: No results for input(s): "HGBA1C" in the last 72 hours. CBG: No results for input(s): "GLUCAP" in the last 168 hours. Lipid Profile: No results for input(s): "CHOL", "HDL", "LDLCALC", "TRIG", "CHOLHDL", "LDLDIRECT" in the last 72 hours. Thyroid  Function Tests: Recent Labs    12/26/23 1619  TSH 2.260   Anemia Panel: No results for input(s): "VITAMINB12", "FOLATE", "FERRITIN", "TIBC", "IRON", "RETICCTPCT" in the last 72 hours. Urine analysis:    Component Value Date/Time   COLORURINE STRAW (A) 12/04/2023 1707   APPEARANCEUR CLEAR 12/04/2023 1707   LABSPEC 1.006 12/04/2023 1707   LABSPEC 1.025 11/08/2013 1431   PHURINE 6.0 12/04/2023 1707   GLUCOSEU NEGATIVE 12/04/2023 1707   GLUCOSEU Negative 11/08/2013 1431   HGBUR NEGATIVE 12/04/2023 1707   BILIRUBINUR NEGATIVE 12/04/2023 1707   BILIRUBINUR negative 10/09/2023 1531   BILIRUBINUR neg 12/10/2014 1256   BILIRUBINUR Negative 11/08/2013 1431   KETONESUR NEGATIVE 12/04/2023 1707   PROTEINUR NEGATIVE 12/04/2023 1707   UROBILINOGEN 0.2 10/09/2023 1531   UROBILINOGEN 0.2 11/08/2013 1431   NITRITE NEGATIVE 12/04/2023 1707   LEUKOCYTESUR NEGATIVE 12/04/2023 1707   LEUKOCYTESUR Small 11/08/2013 1431   Sepsis Labs: @LABRCNTIP (procalcitonin:4,lacticidven:4) ) Recent Results (from the past 240 hours)  Resp panel by RT-PCR (RSV, Flu A&B, Covid) Anterior Nasal Swab     Status: None   Collection Time: 12/26/23  4:19 PM   Specimen: Anterior Nasal Swab  Result Value Ref Range Status   SARS Coronavirus 2 by RT PCR NEGATIVE NEGATIVE Final    Comment: (NOTE) SARS-CoV-2 target nucleic acids  are NOT DETECTED.  The SARS-CoV-2 RNA is generally detectable in upper respiratory specimens during the acute phase of infection. The lowest concentration of SARS-CoV-2 viral copies this assay can detect is 138 copies/mL. A negative result does not preclude SARS-Cov-2 infection and should not be used as the sole basis for treatment or other patient management decisions. A negative result may occur with  improper specimen collection/handling, submission of specimen other than nasopharyngeal swab, presence of viral mutation(s) within the areas targeted by this assay, and inadequate number of viral copies(<138 copies/mL). A negative result must be combined with clinical observations, patient history, and epidemiological information. The expected result is Negative.  Fact Sheet for Patients:  BloggerCourse.com  Fact Sheet for Healthcare Providers:  SeriousBroker.it  This test is no t yet approved or cleared by the [place]  FDA and  has been authorized for detection and/or diagnosis of SARS-CoV-2 by FDA under an Emergency Use Authorization (EUA). This EUA will remain  in effect (meaning this test can be used) for the duration of the COVID-19 declaration under Section 564(b)(1) of the Act, 21 U.S.C.section 360bbb-3(b)(1), unless the authorization is terminated  or revoked sooner.       Influenza A by PCR NEGATIVE NEGATIVE Final   Influenza B by PCR NEGATIVE NEGATIVE Final    Comment: (NOTE) The Xpert Xpress SARS-CoV-2/FLU/RSV plus assay is intended as an aid in the diagnosis of influenza from Nasopharyngeal swab specimens and should not be used as a sole basis for treatment. Nasal washings and aspirates are unacceptable for Xpert Xpress SARS-CoV-2/FLU/RSV testing.  Fact Sheet for Patients: BloggerCourse.com  Fact Sheet for Healthcare Providers: SeriousBroker.it  This test is  not yet approved or cleared  by the [place]  FDA and has been authorized for detection and/or diagnosis of SARS-CoV-2 by FDA under an Emergency Use Authorization (EUA). This EUA will remain in effect (meaning this test can be used) for the duration of the COVID-19 declaration under Section 564(b)(1) of the Act, 21 U.S.C. section 360bbb-3(b)(1), unless the authorization is terminated or revoked.     Resp Syncytial Virus by PCR NEGATIVE NEGATIVE Final    Comment: (NOTE) Fact Sheet for Patients: BloggerCourse.com  Fact Sheet for Healthcare Providers: SeriousBroker.it  This test is not yet approved or cleared by the [place]  FDA and has been authorized for detection and/or diagnosis of SARS-CoV-2 by FDA under an Emergency Use Authorization (EUA). This EUA will remain in effect (meaning this test can be used) for the duration of the COVID-19 declaration under Section 564(b)(1) of the Act, 21 U.S.C. section 360bbb-3(b)(1), unless the authorization is terminated or revoked.  Performed at [facility], [address], [place], [place] [address]      Radiological Exams on Admission: DG Chest Port 1 View Result Date: 12/26/2023 CLINICAL DATA:  Bilateral leg weakness for the past 3-4 weeks. History of CLL and breast cancer. EXAM: PORTABLE CHEST 1 VIEW COMPARISON:  09/03/2022 FINDINGS: Normal sized heart. Clear lungs with normal vascularity. Extensive thoracic and upper lumbar spine fixation hardware. IMPRESSION: No acute abnormality. Electronically Signed   By: Catherin Closs M.D.   On: 12/26/2023 16:24    EKG: Independently reviewed.  Sinus rhythm.  Assessment/Plan Principal Problem:   Hyponatremia Active Problems:   HLD (hyperlipidemia)   Hypothyroidism   Malignant neoplasm of lower-outer quadrant of right breast of female, estrogen receptor positive (HCC)   CLL (chronic lymphocytic leukemia) (HCC)   GERD  (gastroesophageal reflux disease)   Essential hypertension   Depression   Iron deficiency anemia    Hyponatremia -    cause not clear but could be related to patient's polydipsia by drinking more than a gallon of tea every day.  Urine studies and serum osmolality and cortisol levels are pending.  During last years admission for hyponatremia patient also was gently hydrated following which sodium and patient's symptoms improved.  For now patient is on gentle saline hydration at 75 cc/h.  Repeat metabolic panel is pending and  will have further plans based on the studies ordered and trend of sodium. Generalized weakness had similar weakness during hyponatremia episode last year.  Appears nonfocal.  Will get physical therapy and closely monitor symptoms with correction of sodium. Depression on Wellbutrin  Rexulti  fluoxetine amitriptyline .  Given the recurrent hyponatremia may need to consider weaning off Wellbutrin . Mild hyperkalemia recheck potassium after hydration.  Check cortisol levels. CLL and history of breast cancer being followed by oncologist.  Patient is on Calquence . Mild leukocytosis no signs of infection. Chronic anemia on iron and B12 supplements. GERD on PPI. Hyperlipidemia on statins.  Check CK levels. Hypothyroidism on Synthroid . Neuropathy on gabapentin .  Since patient has hyponatremia will need close monitoring and more than 2 midnight stay.   DVT prophylaxis: Lovenox . Code Status: Full code. Family Communication: Discussed with patient. Disposition Plan: Medical floor. Consults called: Physical therapy. Admission status: Observation.

## 2023-12-26 NOTE — ED Triage Notes (Signed)
 Pt arrived with c/o weakness and trembling to BLE when ambulating for about 3 1/2 weeks. Seen at [facility] ED 3 weeks ago. Hx hyponatremia, falls.

## 2023-12-27 DIAGNOSIS — G629 Polyneuropathy, unspecified: Secondary | ICD-10-CM | POA: Diagnosis present

## 2023-12-27 DIAGNOSIS — E871 Hypo-osmolality and hyponatremia: Secondary | ICD-10-CM | POA: Diagnosis not present

## 2023-12-27 DIAGNOSIS — D509 Iron deficiency anemia, unspecified: Secondary | ICD-10-CM | POA: Diagnosis present

## 2023-12-27 DIAGNOSIS — R296 Repeated falls: Secondary | ICD-10-CM | POA: Diagnosis present

## 2023-12-27 DIAGNOSIS — H9191 Unspecified hearing loss, right ear: Secondary | ICD-10-CM | POA: Diagnosis present

## 2023-12-27 DIAGNOSIS — E039 Hypothyroidism, unspecified: Secondary | ICD-10-CM | POA: Diagnosis present

## 2023-12-27 DIAGNOSIS — C9110 Chronic lymphocytic leukemia of B-cell type not having achieved remission: Secondary | ICD-10-CM | POA: Diagnosis present

## 2023-12-27 DIAGNOSIS — E222 Syndrome of inappropriate secretion of antidiuretic hormone: Secondary | ICD-10-CM | POA: Diagnosis present

## 2023-12-27 DIAGNOSIS — F32A Depression, unspecified: Secondary | ICD-10-CM | POA: Diagnosis present

## 2023-12-27 DIAGNOSIS — R631 Polydipsia: Secondary | ICD-10-CM | POA: Diagnosis present

## 2023-12-27 DIAGNOSIS — Z9221 Personal history of antineoplastic chemotherapy: Secondary | ICD-10-CM | POA: Diagnosis not present

## 2023-12-27 DIAGNOSIS — K219 Gastro-esophageal reflux disease without esophagitis: Secondary | ICD-10-CM | POA: Diagnosis present

## 2023-12-27 DIAGNOSIS — Z79899 Other long term (current) drug therapy: Secondary | ICD-10-CM | POA: Diagnosis not present

## 2023-12-27 DIAGNOSIS — E875 Hyperkalemia: Secondary | ICD-10-CM | POA: Diagnosis present

## 2023-12-27 DIAGNOSIS — M797 Fibromyalgia: Secondary | ICD-10-CM | POA: Diagnosis present

## 2023-12-27 DIAGNOSIS — I10 Essential (primary) hypertension: Secondary | ICD-10-CM | POA: Diagnosis present

## 2023-12-27 DIAGNOSIS — R531 Weakness: Secondary | ICD-10-CM | POA: Diagnosis present

## 2023-12-27 DIAGNOSIS — Z170 Estrogen receptor positive status [ER+]: Secondary | ICD-10-CM | POA: Diagnosis not present

## 2023-12-27 DIAGNOSIS — I2510 Atherosclerotic heart disease of native coronary artery without angina pectoris: Secondary | ICD-10-CM | POA: Diagnosis present

## 2023-12-27 DIAGNOSIS — Z79890 Hormone replacement therapy (postmenopausal): Secondary | ICD-10-CM | POA: Diagnosis not present

## 2023-12-27 DIAGNOSIS — Z1152 Encounter for screening for COVID-19: Secondary | ICD-10-CM | POA: Diagnosis not present

## 2023-12-27 DIAGNOSIS — Z85828 Personal history of other malignant neoplasm of skin: Secondary | ICD-10-CM | POA: Diagnosis not present

## 2023-12-27 DIAGNOSIS — E785 Hyperlipidemia, unspecified: Secondary | ICD-10-CM | POA: Diagnosis present

## 2023-12-27 DIAGNOSIS — Z8616 Personal history of COVID-19: Secondary | ICD-10-CM | POA: Diagnosis not present

## 2023-12-27 DIAGNOSIS — Z7951 Long term (current) use of inhaled steroids: Secondary | ICD-10-CM | POA: Diagnosis not present

## 2023-12-27 DIAGNOSIS — C50511 Malignant neoplasm of lower-outer quadrant of right female breast: Secondary | ICD-10-CM | POA: Diagnosis present

## 2023-12-27 LAB — CBC
HCT: 31.7 % — ABNORMAL LOW (ref 36.0–46.0)
HCT: 32.9 % — ABNORMAL LOW (ref 36.0–46.0)
Hemoglobin: 10.4 g/dL — ABNORMAL LOW (ref 12.0–15.0)
Hemoglobin: 11 g/dL — ABNORMAL LOW (ref 12.0–15.0)
MCH: 31.7 pg (ref 26.0–34.0)
MCH: 31.8 pg (ref 26.0–34.0)
MCHC: 32.8 g/dL (ref 30.0–36.0)
MCHC: 33.4 g/dL (ref 30.0–36.0)
MCV: 95.1 fL (ref 80.0–100.0)
MCV: 96.6 fL (ref 80.0–100.0)
Platelets: 204 10*3/uL (ref 150–400)
Platelets: 212 10*3/uL (ref 150–400)
RBC: 3.28 MIL/uL — ABNORMAL LOW (ref 3.87–5.11)
RBC: 3.46 MIL/uL — ABNORMAL LOW (ref 3.87–5.11)
RDW: 13.2 % (ref 11.5–15.5)
RDW: 13.3 % (ref 11.5–15.5)
WBC: 10.7 10*3/uL — ABNORMAL HIGH (ref 4.0–10.5)
WBC: 12.2 10*3/uL — ABNORMAL HIGH (ref 4.0–10.5)
nRBC: 0 % (ref 0.0–0.2)
nRBC: 0 % (ref 0.0–0.2)

## 2023-12-27 LAB — BASIC METABOLIC PANEL WITH GFR
Anion gap: 10 (ref 5–15)
Anion gap: 10 (ref 5–15)
Anion gap: 8 (ref 5–15)
BUN: 15 mg/dL (ref 8–23)
BUN: 15 mg/dL (ref 8–23)
BUN: 15 mg/dL (ref 8–23)
CO2: 19 mmol/L — ABNORMAL LOW (ref 22–32)
CO2: 20 mmol/L — ABNORMAL LOW (ref 22–32)
CO2: 21 mmol/L — ABNORMAL LOW (ref 22–32)
Calcium: 8.6 mg/dL — ABNORMAL LOW (ref 8.9–10.3)
Calcium: 9 mg/dL (ref 8.9–10.3)
Calcium: 9.2 mg/dL (ref 8.9–10.3)
Chloride: 100 mmol/L (ref 98–111)
Chloride: 99 mmol/L (ref 98–111)
Chloride: 99 mmol/L (ref 98–111)
Creatinine, Ser: 0.95 mg/dL (ref 0.44–1.00)
Creatinine, Ser: 1 mg/dL (ref 0.44–1.00)
Creatinine, Ser: 1.04 mg/dL — ABNORMAL HIGH (ref 0.44–1.00)
GFR, Estimated: 55 mL/min — ABNORMAL LOW (ref >60)
GFR, Estimated: 58 mL/min — ABNORMAL LOW (ref >60)
GFR, Estimated: >60 mL/min (ref >60)
Glucose, Bld: 109 mg/dL — ABNORMAL HIGH (ref 70–99)
Glucose, Bld: 134 mg/dL — ABNORMAL HIGH (ref 70–99)
Glucose, Bld: 99 mg/dL (ref 70–99)
Potassium: 4.3 mmol/L (ref 3.5–5.1)
Potassium: 4.6 mmol/L (ref 3.5–5.1)
Potassium: 5.3 mmol/L — ABNORMAL HIGH (ref 3.5–5.1)
Sodium: 128 mmol/L — ABNORMAL LOW (ref 135–145)
Sodium: 128 mmol/L — ABNORMAL LOW (ref 135–145)
Sodium: 130 mmol/L — ABNORMAL LOW (ref 135–145)

## 2023-12-27 LAB — SODIUM, URINE, RANDOM: Sodium, Ur: 64 mmol/L

## 2023-12-27 LAB — URINALYSIS, ROUTINE W REFLEX MICROSCOPIC
Bilirubin Urine: NEGATIVE
Glucose, UA: NEGATIVE mg/dL
Hgb urine dipstick: NEGATIVE
Ketones, ur: NEGATIVE mg/dL
Leukocytes,Ua: NEGATIVE
Nitrite: NEGATIVE
Protein, ur: NEGATIVE mg/dL
Specific Gravity, Urine: 1.005 (ref 1.005–1.030)
pH: 6 (ref 5.0–8.0)

## 2023-12-27 LAB — OSMOLALITY: Osmolality: 267 mosm/kg — ABNORMAL LOW (ref 275–295)

## 2023-12-27 LAB — OSMOLALITY, URINE: Osmolality, Ur: 242 mosm/kg — ABNORMAL LOW (ref 300–900)

## 2023-12-27 LAB — CORTISOL: Cortisol, Plasma: 8.2 ug/dL

## 2023-12-27 MED ORDER — POLYETHYLENE GLYCOL 3350 17 G PO PACK: 17.0000 g | PACK | Freq: Every day | ORAL | Status: DC | PRN

## 2023-12-27 MED ORDER — PROCHLORPERAZINE EDISYLATE 10 MG/2ML IJ SOLN: 5.0000 mg | Freq: Four times a day (QID) | INTRAMUSCULAR | Status: DC | PRN

## 2023-12-27 NOTE — Plan of Care (Signed)

## 2023-12-27 NOTE — Progress Notes (Signed)
  Progress Note   Patient: [name] [name] ZOX:[id number] DOB: March 03, 1946 DOA: 12/26/2023     0 DOS: the patient was seen and examined on 12/27/2023   Brief hospital course: 78 y.o. female with history of CLL, hyperlipidemia, hypothyroidism, GERD, depression presents to the ER with weakness and difficulty ambulating.  Patient states her symptoms has been ongoing for last 3 days with finding it difficult to ambulate with legs giving away.  She has chronic numbness of both lower extremities.  Patient had similar picture in April of last year when she had hyponatremia.  Patient admits to drinking a gallon of unsweetened tea every day.  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  Denies any nausea vomiting diarrhea loss of consciousness or any addition of new medications recently.  Patient had gone to the ER on 12/04/23 with weakness at that time sodium was 134 and patient was given fluid bolus following which symptoms improved and states she was doing fine until last 3 days.   ED Course: In the ER patient appears nonfocal.  Labs show sodium of 125 troponin is flat at 22.19 potassium of 5.7 AST ALT of 46 and 48 patient was started gentle fluid hydration with saline.  Admitted for further observation.  Assessment and Plan: Hyponatremia, likely siadh - sodium studies reviewed. Low serum and urine osm with high urine Na. Sodium improving with 1200cc fluid restriction. Pt received gentle NS x 1 day. Recheck bmet in AM. Of note, pt has hx of siadh and had been taking sodium tabs PTA for around one year Generalized weakness had similar weakness during hyponatremia episode last year.  Consult PT Depression on Wellbutrin  Rexulti  fluoxetine amitriptyline .  Given the recurrent hyponatremia may need to consider weaning off Wellbutrin . Mild hyperkalemia recheck potassium after hydration.  Check cortisol levels. CLL and history of breast cancer being  followed by oncologist.  Patient is on Calquence . Mild leukocytosis no signs of infection. Chronic anemia on iron and B12 supplements. GERD on PPI. Hyperlipidemia on statins.  Hypothyroidism on Synthroid . Neuropathy on gabapentin .      Subjective: Feeling better overall, however still generally weak  Physical Exam: Vitals:   12/26/23 2126 12/27/23 0017 12/27/23 0436 12/27/23 0849  BP: 128/64 (!) 105/48 (!) 103/49 (!) 118/49  Pulse: (!) 103 81 85 87  Resp:    18  Temp: 98.3 F (36.8 C)  98.1 F (36.7 C) 98.1 F (36.7 C)  TempSrc: Oral  Oral Oral  SpO2: 99% 97% 94% 96%  Weight:      Height:       General exam: Awake, laying in bed, in nad Respiratory system: Normal respiratory effort, no wheezing Cardiovascular system: regular rate, s1, s2 Gastrointestinal system: Soft, nondistended, positive BS Central nervous system: CN2-12 grossly intact, strength intact Extremities: Perfused, no clubbing Skin: Normal skin turgor, no notable skin lesions seen Psychiatry: Mood normal // no visual hallucinations   Data Reviewed:  Labs reviewed: Na 130, K 5.3, Cr 1.04  Family Communication: Pt in room, family at bedside  Disposition: Status is: Observation The patient will require care spanning > 2 midnights and should be moved to inpatient because: severity of illness  Planned Discharge Destination: Home    Author: Cherylle Corwin, MD 12/27/2023 6:22 PM  For on call review [email or web address].

## 2023-12-27 NOTE — Hospital Course (Signed)
 78 y.o. female with history of CLL, hyperlipidemia, hypothyroidism, GERD, depression presents to the ER with weakness and difficulty ambulating.  Patient states her symptoms has been ongoing for last 3 days with finding it difficult to ambulate with legs giving away.  She has chronic numbness of both lower extremities.  Patient had similar picture in April of last year when she had hyponatremia.  Patient admits to drinking a gallon of unsweetened tea every day.  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  Denies any nausea vomiting diarrhea loss of consciousness or any addition of new medications recently.  Patient had gone to the ER on 12/04/23 with weakness at that time sodium was 134 and patient was given fluid bolus following which symptoms improved and states she was doing fine until last 3 days.   ED Course: In the ER patient appears nonfocal.  Labs show sodium of 125 troponin is flat at 22.19 potassium of 5.7 AST ALT of 46 and 48 patient was started gentle fluid hydration with saline.  Admitted for further observation.

## 2023-12-27 NOTE — Care Management Obs Status (Signed)
 MEDICARE OBSERVATION STATUS NOTIFICATION   Patient Details  Name: [name] [name] MRN: [id number] Date of Birth: 1945-10-24   Medicare Observation Status Notification Given:  Yes    Illeana, [place], RN 12/27/2023, 2:34 PM

## 2023-12-28 DIAGNOSIS — E871 Hypo-osmolality and hyponatremia: Secondary | ICD-10-CM | POA: Diagnosis not present

## 2023-12-28 DIAGNOSIS — R531 Weakness: Secondary | ICD-10-CM | POA: Diagnosis not present

## 2023-12-28 LAB — CBC
HCT: 31 % — ABNORMAL LOW (ref 36.0–46.0)
Hemoglobin: 10.4 g/dL — ABNORMAL LOW (ref 12.0–15.0)
MCH: 32.4 pg (ref 26.0–34.0)
MCHC: 33.5 g/dL (ref 30.0–36.0)
MCV: 96.6 fL (ref 80.0–100.0)
Platelets: 182 10*3/uL (ref 150–400)
RBC: 3.21 MIL/uL — ABNORMAL LOW (ref 3.87–5.11)
RDW: 13.2 % (ref 11.5–15.5)
WBC: 9.9 10*3/uL (ref 4.0–10.5)
nRBC: 0 % (ref 0.0–0.2)

## 2023-12-28 LAB — COMPREHENSIVE METABOLIC PANEL WITH GFR
ALT: 38 U/L (ref 0–44)
AST: 32 U/L (ref 15–41)
Albumin: 3.5 g/dL (ref 3.5–5.0)
Alkaline Phosphatase: 72 U/L (ref 38–126)
Anion gap: 7 (ref 5–15)
BUN: 19 mg/dL (ref 8–23)
CO2: 21 mmol/L — ABNORMAL LOW (ref 22–32)
Calcium: 8.9 mg/dL (ref 8.9–10.3)
Chloride: 102 mmol/L (ref 98–111)
Creatinine, Ser: 1.12 mg/dL — ABNORMAL HIGH (ref 0.44–1.00)
GFR, Estimated: 50 mL/min — ABNORMAL LOW (ref >60)
Glucose, Bld: 90 mg/dL (ref 70–99)
Potassium: 5 mmol/L (ref 3.5–5.1)
Sodium: 130 mmol/L — ABNORMAL LOW (ref 135–145)
Total Bilirubin: 0.7 mg/dL (ref 0.0–1.2)
Total Protein: 5.4 g/dL — ABNORMAL LOW (ref 6.5–8.1)

## 2023-12-28 LAB — URINE CULTURE

## 2023-12-28 MED ORDER — SODIUM CHLORIDE 1 G PO TABS
1.0000 g | ORAL_TABLET | Freq: Two times a day (BID) | ORAL | Status: DC
  Administered 2023-12-28 – 2023-12-29 (×2): 1 g via ORAL
  Filled 2023-12-28 (×2): qty 1

## 2023-12-28 MED ORDER — ACETAMINOPHEN 325 MG PO TABS: 650.0000 mg | ORAL_TABLET | Freq: Four times a day (QID) | ORAL | Status: DC | PRN

## 2023-12-28 MED ORDER — MELATONIN 5 MG PO TABS
5.0000 mg | ORAL_TABLET | Freq: Every evening | ORAL | Status: DC | PRN
  Administered 2023-12-28 (×2): 5 mg via ORAL
  Filled 2023-12-28 (×2): qty 1

## 2023-12-28 MED ORDER — FLUVOXAMINE MALEATE 50 MG PO TABS
75.0000 mg | ORAL_TABLET | Freq: Every day | ORAL | Status: DC
  Administered 2023-12-28: 75 mg via ORAL
  Filled 2023-12-28 (×2): qty 2

## 2023-12-28 MED ORDER — OXYCODONE HCL 5 MG PO TABS
5.0000 mg | ORAL_TABLET | Freq: Four times a day (QID) | ORAL | Status: AC | PRN
  Administered 2023-12-28: 5 mg via ORAL
  Filled 2023-12-28: qty 1

## 2023-12-28 MED ORDER — AMITRIPTYLINE HCL 50 MG PO TABS
50.0000 mg | ORAL_TABLET | Freq: Every day | ORAL | Status: DC
  Administered 2023-12-28: 50 mg via ORAL
  Filled 2023-12-28: qty 1

## 2023-12-28 NOTE — Progress Notes (Signed)
  Progress Note   Patient: [name] [name] ZOX:[id number] DOB: [birth year] DOA: 12/26/2023     1 DOS: the patient was seen and examined on 12/28/2023   Brief hospital course: 78 y.o. female with history of CLL, hyperlipidemia, hypothyroidism, GERD, depression presents to the ER with weakness and difficulty ambulating.  Patient states her symptoms has been ongoing for last 3 days with finding it difficult to ambulate with legs giving away.  She has chronic numbness of both lower extremities.  Patient had similar picture in April of last year when she had hyponatremia.  Patient admits to drinking a gallon of unsweetened tea every day.  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  Denies any nausea vomiting diarrhea loss of consciousness or any addition of new medications recently.  Patient had gone to the ER on 12/04/23 with weakness at that time sodium was 134 and patient was given fluid bolus following which symptoms improved and states she was doing fine until last 3 days.   ED Course: In the ER patient appears nonfocal.  Labs show sodium of 125 troponin is flat at 22.19 potassium of 5.7 AST ALT of 46 and 48 patient was started gentle fluid hydration with saline.  Admitted for further observation.  Assessment and Plan: Hyponatremia, likely siadh - sodium studies reviewed. Low serum and urine osm with high urine Na. Sodium improving with 1200cc fluid restriction. Pt received gentle NS x 1 day. Of note, pt has been taking amitriptyline , rexulti , and fluvoxamine  PTA, all of which may result in SIADH. Discussed with pharmacist. Rec to reduce amitriptyline  to 50mg  and rexulti  to 75mg . Continue fluid restriction. Recheck bmet in  AM. Will also resume 1g bid sodium tabs Generalized weakness had similar weakness during hyponatremia episode last year.  Consult PT, recs for HHPT Depression on Wellbutrin  Rexulti  fluoxetine amitriptyline .  Given the  recurrent hyponatremia may need to consider weaning off Wellbutrin . Mild hyperkalemia recheck potassium after hydration.  Check cortisol levels. CLL and history of breast cancer being followed by oncologist.  Patient is on [place]ence . Mild leukocytosis no signs of infection. Chronic anemia on iron and B12 supplements. GERD on PPI. Hyperlipidemia on statins.  Hypothyroidism on Synthroid . Neuropathy on gabapentin .      Subjective: Still feeling generally weak today  Physical Exam: Vitals:   12/27/23 0849 12/27/23 2100 12/27/23 2303 12/28/23 0936  BP: (!) 118/49 (!) 114/50 126/68 (!) 118/56  Pulse: 87 98 92 85  Resp: 18 19  18   Temp: 98.1 F (36.7 C) 98.1 F (36.7 C) 97.9 F (36.6 C) 98.7 F (37.1 C)  TempSrc: Oral Oral Oral   SpO2: 96% 96% 97% 95%  Weight:      Height:       General exam: Conversant, in no acute distress Respiratory system: normal chest rise, clear, no audible wheezing Cardiovascular system: regular rhythm, s1-s2 Gastrointestinal system: Nondistended, nontender, pos BS Central nervous system: No seizures, no tremors Extremities: No cyanosis, no joint deformities Skin: No rashes, no pallor Psychiatry: Affect normal // no auditory hallucinations   Data Reviewed:  Labs reviewed:Na 130, K 5.0, Cr 1.12, WBC 9.9, Hgb 10.4  Family Communication: Pt in room, family at bedside  Disposition: Status is: Inpatient Continue inpatient stay because: severity of illness  Planned Discharge Destination: Home    Author: Cherylle Corwin, MD 12/28/2023 4:31 PM  For on call review [email or web address].

## 2023-12-28 NOTE — Progress Notes (Signed)
   12/27/23 2310  Pain Assessment  Pain Scale 0-10  Pain Score 6  Pain Type Acute pain  Pain Location Chest  Pain Orientation Mid  Pain Descriptors / Indicators Aching  Pain Frequency Constant  Pain Onset Other (Comment) (Occurrs with inspiration)  Patients Stated Pain Goal 0  Pain Intervention(s) Emotional support;MD notified (Comment)  Provider Notification  Provider Name/Title Dr. Del Favia  Date Provider Notified 12/27/23  Time Provider Notified 2310  Method of Notification Page  Notification Reason Change in status  Provider response See new orders  Date of Provider Response 12/27/23   Patient is complaining of midsternal chest pain that occurs when she takes a deep breath. She rates as 6/10. BS are decreased. She has a chronic cough. She states her weakness in her legs feels worse tonight. VS are stable. She is NSR on telemetry. Patient also stated she fell prior to coming to the hospital and hit her chest against the chair.  Dr. Del Favia made aware.  EKG obtained.  Oxycodone  and Melatonin given per order.  Patient rested comfortably during the night.  Will continue to monitor patient.  Necia Bali RN

## 2023-12-28 NOTE — Evaluation (Signed)
 Physical Therapy Evaluation Patient Details Name: [name] [name] MRN: [id number] DOB: April 07, 1946 Today's Date: 12/28/2023  History of Present Illness  Pt is a 78 y.o. female who presented 12/26/23 for  LE weakness, falls, hyponatremia. Possible SIADH.  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 Pt from home with husband whom she reports is out of the home most of the day working the farm. Pt reports multiple falls at home. She cannot remember if she had her RW with her or not. Discussed benefits of emergency call system since she is alone in the day. She reports that bathing has become hard and she is afraid of falling. She also reports she begins to have shaking and jerking of LE's while ambulating which keeps her from being able to stand to cook meals or do housework. At times, pt forgets what she is saying midsentence and cannot finish. She reports this has been going on past few years. On eval pt noted to have proximal weakness and better distal weakness on MMT but an inability to sustain the contraction or perform multiple reps. Hen walking with RW her LE shaking begins at 10' distance. Did not witness the jerking that she sometimes has on eval. Pt had HHPT already scheduled before coming to hospital but they had not started. Recommend resuming HHPT at d/c and pt would benefit from [facility] to assist with daily tasks and give more supervision.   Pt currently with functional limitations due to the deficits listed below (see PT Problem List). Pt will benefit from acute skilled PT to increase their independence and safety with mobility to allow discharge.           If plan is discharge home, recommend the following: A little help with walking and/or transfers;A little help with  bathing/dressing/bathroom;Assistance with cooking/housework;Assist for transportation;Help with stairs or ramp for entrance;Supervision due to cognitive status   Can travel by private vehicle        Equipment Recommendations None recommended by PT  Recommendations for Other Services  OT consult    Functional Status Assessment Patient has had a recent decline in their functional status and demonstrates the ability to make significant improvements in function in a reasonable and predictable amount of time.     Precautions / Restrictions Precautions Precautions: Fall Recall of Precautions/Restrictions: Impaired Precaution/Restrictions Comments: fell 2x a few days apart PTA. Does understand her fall risk but STM impaired Restrictions Weight Bearing Restrictions Per Provider Order: No      Mobility  Bed Mobility Overal bed mobility: Needs Assistance Bed Mobility: Supine to Sit, Sit to Supine     Supine to sit: Min assist, HOB elevated Sit to supine: Min assist   General bed mobility comments: pt able to initiate coming to EOB but has difficulty fwd wt shifting so gets stuck in the middle of the bed falling bkwds. Min A to shift trunk fwd and then pt able to scoot until feet on floor. Pt also has difficulty lifting LE"s up to return to supine. Min A needed to LE"s. Practiced bridging in bed and pt was able to complete today but she reports some days she is unable to do this to position self in bed    Transfers Overall transfer level: Needs assistance Equipment used: Rolling walker (2 wheels) Transfers: Sit to/from Stand Sit  to Stand: Min assist           General transfer comment: min A for safety, no LOB with hands on RW    Ambulation/Gait Ambulation/Gait assistance: Min assist Gait Distance (Feet): 10 Feet (2x) Assistive device: Rolling walker (2 wheels) Gait Pattern/deviations: Step-through pattern, Decreased stride length, Trunk flexed Gait velocity: decreased Gait  velocity interpretation: <1.8 ft/sec, indicate of risk for recurrent falls   General Gait Details: LE's began shaking at 10' and pt instructed to take seated rest. Occurred on second bout as well. She reports this is what happens at home too. She is no longer to tolerate standing to get things done at home such as housework or cooking  Acupuncturist Bed    Modified Rankin (Stroke Patients Only)       Balance Overall balance assessment: Needs assistance, History of Falls Sitting-balance support: Feet supported, No upper extremity supported Sitting balance-Leahy Scale: Fair Sitting balance - Comments: needed increased time to gain balance EOB. Once feet were on floor she could maintain static balance but until then pt was falling bkwds Postural control: Posterior lean Standing balance support: Bilateral upper extremity supported, During functional activity Standing balance-Leahy Scale: Poor Standing balance comment: needs UE support for stability                             Pertinent Vitals/Pain Pain Assessment Pain Assessment: Faces Faces Pain Scale: Hurts little more Pain Location: upper back Pain Descriptors / Indicators: Constant, Aching Pain Intervention(s): Limited activity within patient's tolerance, Monitored during session    Home Living Family/patient expects to be discharged to:: Private residence Living Arrangements: Spouse/significant other Available Help at Discharge: Family;Available PRN/intermittently Type of Home: House Home Access: Stairs to enter Entrance Stairs-Rails: Can reach both Entrance Stairs-Number of Steps: 2   Home Layout: Laundry or work area in basement;One level Home Equipment: Agricultural consultant (2 wheels);Cane - single point;Shower seat;BSC/3in1;Adaptive equipment Additional Comments: husband farms so is often out of the house for 12+ hours at a time. Daughter lives in [place] and visits  frequently    Prior Function Prior Level of Function : Driving;Needs assist (drives minimally per her report but question this)               ADLs Comments: independent but has gotten difficult and has been mostly sponge bathing     Extremity/Trunk Assessment   Upper Extremity Assessment Upper Extremity Assessment: Defer to OT evaluation    Lower Extremity Assessment Lower Extremity Assessment: RLE deficits/detail;LLE deficits/detail;Generalized weakness RLE Deficits / Details: hip flex 2+/5 knee ext 4-/5, knee flex 4+/5. Pt with proximal weakness but in addition, though she can give good effort for MMT with one rep, she fatigues quickly with mutliple reps and begins to shake and occasionally have jerlking when performing functional activities. RLE Sensation: history of peripheral neuropathy;decreased proprioception RLE Coordination: decreased gross motor LLE Deficits / Details: same as RLE LLE Sensation: history of peripheral neuropathy;decreased proprioception LLE Coordination: decreased gross motor    Cervical / Trunk Assessment Cervical / Trunk Assessment: Back Surgery (multiple)  Communication   Communication Communication: No apparent difficulties    Cognition Arousal: Alert Behavior During Therapy: WFL for tasks assessed/performed   PT - Cognitive impairments: Memory, Problem solving, Sequencing  PT - Cognition Comments: pt with STM deficits, cannot give recent timeline of events. She is aware that her cognition has been declining and states that it has been over past several years. Pt sometimes forgets what she is saying midsentence. Has difficulty sequencing at times. Needs one step commands Following commands: Impaired Following commands impaired: Follows one step commands with increased time, Follows multi-step commands inconsistently     Cueing Cueing Techniques: Verbal cues, Tactile cues     General Comments General comments  (skin integrity, edema, etc.): Discussed possibility of pt getting life alert since she is in the house alone for many hours at a time. Also discussed HHaide to help with bathing and some housework    Exercises     Assessment/Plan    PT Assessment Patient needs continued PT services  PT Problem List Decreased strength;Decreased activity tolerance;Decreased balance;Decreased mobility;Decreased coordination;Impaired sensation;Decreased safety awareness       PT Treatment Interventions DME instruction;Gait training;Stair training;Functional mobility training;Therapeutic activities;Therapeutic exercise;Balance training;Cognitive remediation;Patient/family education;Neuromuscular re-education    PT Goals (Current goals can be found in the Care Plan section)  Acute Rehab PT Goals Patient Stated Goal: return home PT Goal Formulation: With patient Time For Goal Achievement: 01/11/24 Potential to Achieve Goals: Good    Frequency Min 2X/week     Co-evaluation               AM-PAC PT "6 Clicks" Mobility  Outcome Measure Help needed turning from your back to your side while in a flat bed without using bedrails?: A Little Help needed moving from lying on your back to sitting on the side of a flat bed without using bedrails?: A Little Help needed moving to and from a bed to a chair (including a wheelchair)?: A Lot Help needed standing up from a chair using your arms (e.g., wheelchair or bedside chair)?: A Lot Help needed to walk in hospital room?: A Lot Help needed climbing 3-5 steps with a railing? : A Lot 6 Click Score: 14    End of Session Equipment Utilized During Treatment: Gait belt Activity Tolerance: Patient tolerated treatment well Patient left: in bed;with call bell/phone within reach;with bed alarm set Nurse Communication: Mobility status PT Visit Diagnosis: Unsteadiness on feet (R26.81);History of falling (Z91.81);Repeated falls (R29.6);Muscle weakness (generalized)  (M62.81);Pain Pain - part of body:  (upper back)    Time: 9629-5284 PT Time Calculation (min) (ACUTE ONLY): 34 min   Charges:   PT Evaluation $PT Eval Moderate Complexity: 1 Mod PT Treatments $Gait Training: 8-22 mins PT General Charges $$ ACUTE PT VISIT: 1 Visit         Amey Ka, PT  Acute Rehab Services Secure chat preferred Office [phone]   Deloris Fetters Lyndell Gillyard 12/28/2023, 12:18 PM

## 2023-12-29 ENCOUNTER — Other Ambulatory Visit (HOSPITAL_COMMUNITY): Payer: Self-pay

## 2023-12-29 DIAGNOSIS — R531 Weakness: Secondary | ICD-10-CM | POA: Diagnosis not present

## 2023-12-29 DIAGNOSIS — E871 Hypo-osmolality and hyponatremia: Secondary | ICD-10-CM | POA: Diagnosis not present

## 2023-12-29 LAB — COMPREHENSIVE METABOLIC PANEL WITH GFR
ALT: 42 U/L (ref 0–44)
AST: 34 U/L (ref 15–41)
Albumin: 3.5 g/dL (ref 3.5–5.0)
Alkaline Phosphatase: 73 U/L (ref 38–126)
Anion gap: 7 (ref 5–15)
BUN: 21 mg/dL (ref 8–23)
CO2: 21 mmol/L — ABNORMAL LOW (ref 22–32)
Calcium: 9.4 mg/dL (ref 8.9–10.3)
Chloride: 107 mmol/L (ref 98–111)
Creatinine, Ser: 1.1 mg/dL — ABNORMAL HIGH (ref 0.44–1.00)
GFR, Estimated: 51 mL/min — ABNORMAL LOW (ref >60)
Glucose, Bld: 98 mg/dL (ref 70–99)
Potassium: 5.1 mmol/L (ref 3.5–5.1)
Sodium: 135 mmol/L (ref 135–145)
Total Bilirubin: 0.4 mg/dL (ref 0.0–1.2)
Total Protein: 5.5 g/dL — ABNORMAL LOW (ref 6.5–8.1)

## 2023-12-29 LAB — CBC
HCT: 32.5 % — ABNORMAL LOW (ref 36.0–46.0)
Hemoglobin: 10.6 g/dL — ABNORMAL LOW (ref 12.0–15.0)
MCH: 31.9 pg (ref 26.0–34.0)
MCHC: 32.6 g/dL (ref 30.0–36.0)
MCV: 97.9 fL (ref 80.0–100.0)
Platelets: 202 10*3/uL (ref 150–400)
RBC: 3.32 MIL/uL — ABNORMAL LOW (ref 3.87–5.11)
RDW: 13.6 % (ref 11.5–15.5)
WBC: 9 10*3/uL (ref 4.0–10.5)
nRBC: 0 % (ref 0.0–0.2)

## 2023-12-29 MED ORDER — FLUVOXAMINE MALEATE 50 MG PO TABS
75.0000 mg | ORAL_TABLET | Freq: Every day | ORAL | 0 refills | Status: AC
  Filled 2023-12-29: qty 12, 8d supply, fill #0

## 2023-12-29 MED ORDER — AMITRIPTYLINE HCL 25 MG PO TABS
50.0000 mg | ORAL_TABLET | Freq: Every day | ORAL | 0 refills | Status: AC
  Filled 2023-12-29: qty 60, 30d supply, fill #0

## 2023-12-29 NOTE — Evaluation (Signed)
 Occupational Therapy Evaluation Patient Details Name: [name] [name] MRN: [id number] DOB: 1945-12-04 Today's Date: 12/29/2023   History of Present Illness   Pt is a 78 y.o. female who presented 12/26/23 for  LE weakness, falls, hyponatremia. Possible SIADH.  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 Pt lives at home with husband who works during the day, daughter comes by 2X/wk to assist with some IADLs, prepare meals. Pt typically ambulates unassisted, independent with ADLs, has been sponge bathing due to fear of falling, has walk in shower with stool, they plan to install grab bars soon. Pt has been using RW for the last week due to weakness/falls. Pt at this time feeling stronger, ambulated 75 feet at supervision level using RW, no LOB, no rest breaks, did need 2 attempts to stand, states she lost balance standing from toilet earlier today. Will continue to see Pt acutely to progress strength and activity tolerance as able, no follow up OT or DME needed.      If plan is discharge home, recommend the following:   A little help with walking and/or transfers;A little help with bathing/dressing/bathroom;Assistance with cooking/housework;Assist for transportation;Help with stairs or ramp for entrance     Functional Status Assessment   Patient has had a recent decline in their functional status and demonstrates the ability to make significant improvements in function in a reasonable and predictable amount of time.     Equipment Recommendations   None recommended by OT     Recommendations for Other Services         Precautions/Restrictions   Precautions Precautions: Fall Recall of Precautions/Restrictions: Impaired Precaution/Restrictions  Comments: fell 2x a few days apart PTA. Does understand her fall risk but STM impaired Restrictions Weight Bearing Restrictions Per Provider Order: No     Mobility Bed Mobility Overal bed mobility: Needs Assistance Bed Mobility: Supine to Sit, Sit to Supine     Supine to sit: Supervision Sit to supine: Supervision   General bed mobility comments: supervision for safety, no physical assist    Transfers Overall transfer level: Needs assistance Equipment used: Rolling walker (2 wheels) Transfers: Sit to/from Stand, Bed to chair/wheelchair/BSC Sit to Stand: Supervision     Step pivot transfers: Supervision     General transfer comment: supervision for safety, two attempts for STS, no phyisical assist      Balance Overall balance assessment: Needs assistance Sitting-balance support: No upper extremity supported, Feet supported Sitting balance-Leahy Scale: Fair       Standing balance-Leahy Scale: Fair Standing balance comment: uses RW for support, able to stand without support, no LOB                           ADL either performed or assessed with clinical judgement   ADL Overall ADL's : Needs assistance/impaired;At baseline Eating/Feeding: Independent   Grooming: Supervision/safety;Standing   Upper Body Bathing: Set up;Sitting   Lower Body Bathing: Set up;Sitting/lateral leans;Sit to/from stand   Upper Body Dressing : Set up;Sitting   Lower Body Dressing: Set up;Sitting/lateral leans;Sit to/from stand   Toilet Transfer: Supervision/safety;Rolling walker (2 wheels)   Toileting- Clothing Manipulation and Hygiene: Set up;Supervision/safety;Sit to/from stand       Functional mobility during ADLs: Supervision/safety;Rolling walker (  2 wheels) General ADL Comments: Pt doing better, completed ADLs at bedside, two attempts with STS but transfers well with RW     Vision Baseline Vision/History: 0 No visual deficits Ability to See in Adequate Light: 0  Adequate Patient Visual Report: No change from baseline       Perception         Praxis         Pertinent Vitals/Pain Pain Assessment Pain Assessment: Faces Faces Pain Scale: Hurts little more Pain Location: upper back Pain Descriptors / Indicators: Constant, Aching Pain Intervention(s): Monitored during session     Extremity/Trunk Assessment             Communication Communication Communication: No apparent difficulties   Cognition Arousal: Alert Behavior During Therapy: WFL for tasks assessed/performed Cognition: Cognition impaired             OT - Cognition Comments: Pt reports have STM memory, forgetfulness                 Following commands: Intact       Cueing  General Comments          Exercises     Shoulder Instructions      Home Living Family/patient expects to be discharged to:: Private residence Living Arrangements: Spouse/significant other Available Help at Discharge: Family;Available PRN/intermittently Type of Home: House Home Access: Stairs to enter [employer] of Steps: 2 Entrance Stairs-Rails: Can reach both Home Layout: Laundry or work area in basement;One level     Bathroom Shower/Tub: Producer, television/film/video: Standard     Home Equipment: Agricultural consultant (2 wheels);Cane - single point;Shower seat;BSC/3in1;Adaptive equipment;Grab bars - tub/shower Adaptive Equipment: Reacher Additional Comments: husband farms so is often out of the house for 12+ hours at a time. Daughter lives in [place] and visits 2X/wk      Prior Functioning/Environment Prior Level of Function : Driving;Needs assist             Mobility Comments: No AD, but uses RW in the last week ADLs Comments: independent but has gotten difficult and has been mostly sponge bathing    OT Problem List: Decreased strength;Decreased range of motion;Decreased activity tolerance;Impaired balance (sitting and/or  standing);Pain;Obesity   OT Treatment/Interventions: Self-care/ADL training;Therapeutic exercise;Energy conservation;DME and/or AE instruction;Therapeutic activities;Patient/family education;Balance training      OT Goals(Current goals can be found in the care plan section)   Acute Rehab OT Goals Patient Stated Goal: to return home, improve strength OT Goal Formulation: With patient/family Time For Goal Achievement: 01/12/24 Potential to Achieve Goals: Good   OT Frequency:  Min 2X/week    Co-evaluation              AM-PAC OT "6 Clicks" Daily Activity     Outcome Measure Help from another person eating meals?: None Help from another person taking care of personal grooming?: A Little Help from another person toileting, which includes using toliet, bedpan, or urinal?: A Little Help from another person bathing (including washing, rinsing, drying)?: A Little Help from another person to put on and taking off regular upper body clothing?: A Little Help from another person to put on and taking off regular lower body clothing?: A Little 6 Click Score: 19   End of Session Equipment Utilized During Treatment: Gait belt;Rolling walker (2 wheels) Nurse Communication: Mobility status  Activity Tolerance: Patient tolerated treatment well Patient left: in bed;with call bell/phone within reach;with bed alarm set;with family/visitor present  OT  Visit Diagnosis: Unsteadiness on feet (R26.81);History of falling (Z91.81);Pain;Muscle weakness (generalized) (M62.81)                Time: 1006-1030 OT Time Calculation (min): 24 min Charges:  OT General Charges $OT Visit: 1 Visit OT Evaluation $OT Eval Low Complexity: 1 Low OT Treatments $Self Care/Home Management : 8-22 mins  [address] OTR/L   Scherry Curtis 12/29/2023, 11:02 AM

## 2023-12-29 NOTE — Progress Notes (Signed)
 Physical Therapy Treatment Patient Details Name: [name] [name] MRN: [id number] DOB: 1946-05-21 Today's Date: 12/29/2023   History of Present Illness Pt is a 78 y.o. female who presented 12/26/23 for  LE weakness, falls, hyponatremia. Possible SIADH.  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 However, pt continues to have posterior bias in sitting and standing and her fall risk, esp of a bkwds fall is very high. Continue to recommend that she have supervision at home while her husband is working. Gave her a gait belt for family to use at home. Trialed a rollator to give pt opportunity to have seat when needed but she could not safely control to the greater ease of motion with it and seemed unlikely to be able to remember how to set brakes and sequence transfers safely with it so recommend continuance with 2 wheel RW that she already has. Pt ambulated 150' with CGA. Rec HHPT upon d/c.     If plan is discharge home, recommend the following: A little help with walking and/or transfers;A little help with bathing/dressing/bathroom;Assistance with cooking/housework;Assist for transportation;Help with stairs or ramp for entrance;Supervision due to cognitive status   Can travel by private vehicle        Equipment Recommendations  None recommended by PT;Other (comment) [facility])    Recommendations for Other Services OT consult     Precautions / Restrictions Precautions Precautions: Fall Recall of Precautions/Restrictions: Impaired Precaution/Restrictions Comments: fell 2x a few days apart PTA. Does understand her fall risk but STM impaired Restrictions Weight Bearing Restrictions Per Provider  Order: No     Mobility  Bed Mobility Overal bed mobility: Needs Assistance Bed Mobility: Supine to Sit     Supine to sit: Supervision     General bed mobility comments: pt able to come to EOB today with greater ease than yesterday and in less time. Still had some difficulty gaining balance EOB due to post bias    Transfers Overall transfer level: Needs assistance Equipment used: Rollator (4 wheels) Transfers: Sit to/from Stand Sit to Stand: Contact guard assist           General transfer comment: took pt 3 attempts to come to standing but did not need physical assist. Braces LE's against bed and posterior lean noted to hinder task    Ambulation/Gait Ambulation/Gait assistance: Contact guard assist Gait Distance (Feet): 150 Feet Assistive device: Rollator (4 wheels) Gait Pattern/deviations: Step-through pattern, Decreased stride length, Trunk flexed Gait velocity: decreased Gait velocity interpretation: 1.31 - 2.62 ft/sec, indicative of limited community ambulator   General Gait Details: used rollator today during session to see if she could use it safely and it would offer seat for when LE's become shaky, which they did NOT today. However, pt not very safe with rollator, has difficulty controlling momentum and PT does not trust that she can remember how to safely use brakes, turn and sit, etc. Recommend continuing with 2 wheel RW at home and pt really needs supervision for mobility   Stairs             Wheelchair Mobility     Tilt Bed    Modified Rankin (Stroke Patients Only)       Balance  Overall balance assessment: Needs assistance Sitting-balance support: No upper extremity supported, Feet supported Sitting balance-Leahy Scale: Fair Sitting balance - Comments: posterior bias Postural control: Posterior lean Standing balance support: Bilateral upper extremity supported, During functional activity Standing balance-Leahy Scale: Fair Standing balance  comment: pt able to stand without support to pull pants down/ up in bathroom but sometimes loses balance bkwds and has difficulty catching self                            Communication Communication Communication: No apparent difficulties  Cognition Arousal: Alert Behavior During Therapy: WFL for tasks assessed/performed   PT - Cognitive impairments: Memory, Problem solving, Sequencing                       PT - Cognition Comments: STM deficits impair safety Following commands: Impaired Following commands impaired: Follows one step commands with increased time, Follows multi-step commands inconsistently    Cueing Cueing Techniques: Verbal cues, Tactile cues  Exercises      General Comments General comments (skin integrity, edema, etc.): pt given gait belt for family to use at home and again encouraged pt to look into having someone to sit with her when husband is working      Pertinent Vitals/Pain Pain Assessment Pain Assessment: Faces Faces Pain Scale: Hurts little more Pain Location: upper back, chronic Pain Descriptors / Indicators: Constant, Aching Pain Intervention(s): Limited activity within patient's tolerance    Home Living Family/patient expects to be discharged to:: Private residence Living Arrangements: Spouse/significant other Available Help at Discharge: Family;Available PRN/intermittently Type of Home: House Home Access: Stairs to enter Entrance Stairs-Rails: Can reach both Entrance Stairs-Number of Steps: 2   Home Layout: Laundry or work area in basement;One level Home Equipment: Agricultural consultant (2 wheels);Cane - single point;Shower seat;BSC/3in1;Adaptive equipment;Grab bars - tub/shower Additional Comments: husband farms so is often out of the house for 12+ hours at a time. Daughter lives in [place] and visits 2X/wk    Prior Function            PT Goals (current goals can now be found in the care plan section) Acute Rehab PT  Goals Patient Stated Goal: return home PT Goal Formulation: With patient Time For Goal Achievement: 01/11/24 Potential to Achieve Goals: Good Progress towards PT goals: Progressing toward goals    Frequency    Min 2X/week      PT Plan      Co-evaluation              AM-PAC PT "6 Clicks" Mobility   Outcome Measure  Help needed turning from your back to your side while in a flat bed without using bedrails?: A Little Help needed moving from lying on your back to sitting on the side of a flat bed without using bedrails?: A Little Help needed moving to and from a bed to a chair (including a wheelchair)?: A Lot Help needed standing up from a chair using your arms (e.g., wheelchair or bedside chair)?: A Lot Help needed to walk in hospital room?: A Lot Help needed climbing 3-5 steps with a railing? : A Lot 6 Click Score: 14    End of Session Equipment Utilized During Treatment: Gait belt Activity Tolerance: Patient tolerated treatment well Patient left: with call bell/phone within reach;in chair;with chair alarm set Nurse Communication: Mobility status PT Visit Diagnosis: Unsteadiness on feet (R26.81);History of falling (Z91.81);Repeated falls (R29.6);Muscle weakness (generalized) (  M62.81);Pain Pain - part of body:  (upper back)     Time: 1610-9604 PT Time Calculation (min) (ACUTE ONLY): 24 min  Charges:    $Gait Training: 23-37 mins PT General Charges $$ ACUTE PT VISIT: 1 Visit                     Amey Ka, PT  Acute Rehab Services Secure chat preferred Office [phone]    Deloris Fetters Dylin Breeden 12/29/2023, 1:09 PM

## 2023-12-29 NOTE — Discharge Summary (Signed)
 Physician Discharge Summary   Patient: [name] [name] MRN: [id number] DOB: Sep 15, 1945  Admit date:     12/26/2023  Discharge date: 12/29/23  Discharge Physician: Cherylle Corwin   PCP: Imelda Man, MD   Recommendations at discharge:    Follow up with PCP in 1-2 weeks Recommend recheck sodium in 1 weeks Please consider alternatives to amitriptyline  and Luvox  if possible given risk of low sodium. Note that doses of these were decreased this admission  Discharge Diagnoses: Principal Problem:   Hyponatremia Active Problems:   HLD (hyperlipidemia)   Hypothyroidism   Malignant neoplasm of lower-outer quadrant of right breast of female, estrogen receptor positive (HCC)   CLL (chronic lymphocytic leukemia) (HCC)   GERD (gastroesophageal reflux disease)   Essential hypertension   Depression   Iron deficiency anemia   Hyperkalemia   Leucocytosis  Resolved Problems:   * No resolved hospital problems. *  Hospital Course: 78 y.o. female with history of CLL, hyperlipidemia, hypothyroidism, GERD, depression presents to the ER with weakness and difficulty ambulating.  Patient states her symptoms has been ongoing for last 3 days with finding it difficult to ambulate with legs giving away.  She has chronic numbness of both lower extremities.  Patient had similar picture in April of last year when she had hyponatremia.  Patient admits to drinking a gallon of unsweetened tea every day.  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  Denies any nausea vomiting diarrhea loss of consciousness or any addition of new medications recently.  Patient had gone to the ER on 12/04/23 with weakness at that time sodium was 134 and patient was given fluid bolus following which symptoms improved and states she was doing fine until last 3 days.   ED Course: In the ER patient appears nonfocal.  Labs show sodium of 125 troponin is flat at 22.19  potassium of 5.7 AST ALT of 46 and 48 patient was started gentle fluid hydration with saline.  Admitted for further observation.  Assessment and Plan: Hyponatremia, likely siadh - sodium studies reviewed. Low serum and urine osm with high urine Na. Sodium improving with 1200cc fluid restriction. Pt received gentle NS x 1 day. Of note, pt has been taking amitriptyline , rexulti , and fluvoxamine  PTA, all of which may result in SIADH. Discussed with pharmacist. Rec to reduce amitriptyline  to 50mg  and fluvoxamine  to 75mg . Sodium normalized with fluid restriction and sodium tabs. Recommend close monitoring of sodium as outpatient Generalized weakness had similar weakness during hyponatremia episode last year.  Consult PT, recs for HHPT Depression on Wellbutrin  Rexulti  fluoxetine amitriptyline .  Given the recurrent hyponatremia may need to consider weaning off above medications Mild hyperkalemia  CLL and history of breast cancer being followed by oncologist.  Patient is on Calquence . Mild leukocytosis no signs of infection. Chronic anemia on iron and B12 supplements. GERD on PPI. Hyperlipidemia on statins.  Hypothyroidism on Synthroid . Neuropathy on gabapentin .       Consultants:  Procedures performed:   Disposition: Home Diet recommendation:  Regular diet DISCHARGE MEDICATION: Allergies as of 12/29/2023       Reactions   Furosemide Nausea And Vomiting, Other (See Comments), Itching   HEADACHE Other Reaction(s): GI Intolerance, Other (See Comments)   Sulfa Antibiotics Hives   Trazodone And Nefazodone Other (See Comments)   insomnia   Lyrica [pregabalin] Diarrhea, Nausea Only, Rash        Medication List     TAKE  these medications    allopurinol  300 MG tablet Commonly known as: ZYLOPRIM  TAKE 1 TABLET(300 MG) BY MOUTH DAILY   amitriptyline  50 MG tablet Commonly known as: ELAVIL  Take 1 tablet (50 mg total) by mouth at bedtime. What changed:  medication strength how much to  take   aspirin  EC 81 MG tablet Take 81 mg by mouth daily.   brexpiprazole  1 MG Tabs tablet Commonly known as: REXULTI  Take 1 mg by mouth daily.   budesonide -formoterol  160-4.5 MCG/ACT inhaler Commonly known as: Symbicort  Inhale 2 puffs into the lungs 2 (two) times daily.   buPROPion  300 MG 24 hr tablet Commonly known as: WELLBUTRIN  XL Take 300 mg by mouth daily.   Calquence  100 MG tablet Generic drug: acalabrutinib  maleate Take 1 tablet (100 mg total) by mouth 2 (two) times daily.   ferrous sulfate  325 (65 FE) MG tablet Take 325 mg by mouth daily with breakfast.   fluvoxaMINE  25 MG tablet Commonly known as: LUVOX  Take 3 tablets (75 mg total) by mouth at bedtime. What changed:  medication strength how much to take Another medication with the same name was removed. Continue taking this medication, and follow the directions you see here.   gabapentin  100 MG capsule Commonly known as: NEURONTIN  Take 100-300 mg by mouth 2 (two) times daily. Take one capsule by mouth in the morning. Take three capsules by mouth before bedtime   levothyroxine  100 MCG tablet Commonly known as: SYNTHROID  Take 100 mcg by mouth daily.   omeprazole  40 MG capsule Commonly known as: PRILOSEC TAKE 1 CAPSULE(40 MG) BY MOUTH DAILY AFTER BREAKFAST What changed: See the new instructions.   rosuvastatin  5 MG tablet Commonly known as: CRESTOR  TAKE 1 TABLET(5 MG) BY MOUTH DAILY What changed: See the new instructions.   sodium chloride  1 g tablet Take 1 g by mouth 2 (two) times daily.   Vitamin B12 1000 MCG Tbcr Take 1,000 Units by mouth daily.   Vitamin D3 50 MCG (2000 UT) Tabs Take 2,000 Units by mouth daily.   Womens 50+ Multi Vitamin/Min Tabs Take 1 tablet by mouth daily.        Follow-up Information     Imelda Man, MD Follow up in 2 week(s).   Specialty: Internal Medicine Why: Hospital follow up Contact information: [address] [place]  [address] [phone]                Discharge Exam: [name] [name] Weights   12/26/23 1451  Weight: 88.5 kg   General exam: Awake, laying in bed, in nad Respiratory system: Normal respiratory effort, no wheezing Cardiovascular system: regular rate, s1, s2 Gastrointestinal system: Soft, nondistended, positive BS Central nervous system: CN2-12 grossly intact, strength intact Extremities: Perfused, no clubbing Skin: Normal skin turgor, no notable skin lesions seen Psychiatry: Mood normal // no visual hallucinations   Condition at discharge: fair  The results of significant diagnostics from this hospitalization (including imaging, microbiology, ancillary and laboratory) are listed below for reference.   Imaging Studies: DG Chest Port 1 View Result Date: 12/26/2023 CLINICAL DATA:  Bilateral leg weakness for the past 3-4 weeks. History of CLL and breast cancer. EXAM: PORTABLE CHEST 1 VIEW COMPARISON:  09/03/2022 FINDINGS: Normal sized heart. Clear lungs with normal vascularity. Extensive thoracic and upper lumbar spine fixation hardware. IMPRESSION: No acute abnormality. Electronically Signed   By: Catherin Closs M.D.   On: 12/26/2023 16:24   CT Head Wo Contrast Result Date: 12/04/2023 CLINICAL DATA:  Memory loss.  Weakness and tremors for several days. EXAM: CT HEAD WITHOUT CONTRAST TECHNIQUE: Contiguous axial images were obtained from the base of the skull through the vertex without intravenous contrast. RADIATION DOSE REDUCTION: This exam was performed according to the departmental dose-optimization program which includes automated exposure control, adjustment of the mA and/or kV according to patient size and/or use of iterative reconstruction technique. COMPARISON:  CT head without contrast 08/02/2015 FINDINGS: Brain: No acute infarct, hemorrhage, or mass lesion is present. Mild atrophy and white matter changes are within normal limits for age. Insular ribbon is normal bilaterally. A remote  lacunar infarct is present the posterior right lentiform nucleus. The deep brain nuclei are otherwise within normal limits. The brainstem and cerebellum are within normal limits. Midline structures are within normal limits. Vascular: No hyperdense vessel or unexpected calcification. Skull: Calvarium is intact. No focal lytic or blastic lesions are present. No significant extracranial soft tissue lesion is present. Sinuses/Orbits: The paranasal sinuses and mastoid air cells are clear. The globes and orbits are within normal limits. IMPRESSION: 1. No acute intracranial abnormality or significant interval change. 2. Mild atrophy and white matter changes are within normal limits for age. 3. 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 Electronically Signed   By: Audree Leas M.D.   On: 12/04/2023 18:22    Microbiology: Results for orders placed or performed during the hospital encounter of 12/26/23  Resp panel by RT-PCR (RSV, Flu A&B, Covid) Anterior Nasal Swab     Status: None   Collection Time: 12/26/23  4:19 PM   Specimen: Anterior Nasal Swab  Result Value Ref Range Status   SARS Coronavirus 2 by RT PCR NEGATIVE NEGATIVE Final    Comment: (NOTE) SARS-CoV-2 target nucleic acids are NOT DETECTED.  The SARS-CoV-2 RNA is generally detectable in upper respiratory specimens during the acute phase of infection. The lowest concentration of SARS-CoV-2 viral copies this assay can detect is 138 copies/mL. A negative result does not preclude SARS-Cov-2 infection and should not be used as the sole basis for treatment or other patient management decisions. A negative result may occur with  improper specimen collection/handling, submission of specimen other than nasopharyngeal swab, presence of viral mutation(s) within the areas targeted by this assay, and inadequate number of viral copies(<138 copies/mL). A negative result must be combined with clinical observations, patient  history, and epidemiological information. The expected result is Negative.  Fact Sheet for Patients:  BloggerCourse.com  Fact Sheet for Healthcare Providers:  SeriousBroker.it  This test is no t yet approved or cleared by the [place]  FDA and  has been authorized for detection and/or diagnosis of SARS-CoV-2 by FDA under an Emergency Use Authorization (EUA). This EUA will remain  in effect (meaning this test can be used) for the duration of the COVID-19 declaration under Section 564(b)(1) of the Act, 21 U.S.C.section 360bbb-3(b)(1), unless the authorization is terminated  or revoked sooner.       Influenza A by PCR NEGATIVE NEGATIVE Final   Influenza B by PCR NEGATIVE NEGATIVE Final    Comment: (NOTE) The Xpert Xpress SARS-CoV-2/FLU/RSV plus assay is intended as an aid in the diagnosis of influenza from Nasopharyngeal swab specimens and should not be used as a sole basis for treatment. Nasal washings and aspirates are unacceptable for Xpert Xpress SARS-CoV-2/FLU/RSV testing.  Fact Sheet for Patients: BloggerCourse.com  Fact Sheet for Healthcare Providers: SeriousBroker.it  This test is not yet approved or cleared by the [place]  FDA and has been authorized for  detection and/or diagnosis of SARS-CoV-2 by FDA under an Emergency Use Authorization (EUA). This EUA will remain in effect (meaning this test can be used) for the duration of the COVID-19 declaration under Section 564(b)(1) of the Act, 21 U.S.C. section 360bbb-3(b)(1), unless the authorization is terminated or revoked.     Resp Syncytial Virus by PCR NEGATIVE NEGATIVE Final    Comment: (NOTE) Fact Sheet for Patients: BloggerCourse.com  Fact Sheet for Healthcare Providers: SeriousBroker.it  This test is not yet approved or cleared by the [place]  FDA  and has been authorized for detection and/or diagnosis of SARS-CoV-2 by FDA under an Emergency Use Authorization (EUA). This EUA will remain in effect (meaning this test can be used) for the duration of the COVID-19 declaration under Section 564(b)(1) of the Act, 21 U.S.C. section 360bbb-3(b)(1), unless the authorization is terminated or revoked.  Performed at [facility], [address] [place], [place] [address]   Urine Culture     Status: Abnormal   Collection Time: 12/26/23  9:19 PM   Specimen: Urine, Clean Catch  Result Value Ref Range Status   Specimen Description URINE, CLEAN CATCH  Final   Special Requests   Final    NONE Performed at [facility], [address], [place], [place] [address]    Culture MULTIPLE SPECIES PRESENT, SUGGEST RECOLLECTION (A)  Final   Report Status 12/28/2023 FINAL  Final   *Note: Due to a large number of results and/or encounters for the requested time period, some results have not been displayed. A complete set of results can be found in Results Review.    Labs: CBC: Recent Labs  Lab 12/26/23 1619 12/26/23 2346 12/27/23 0714 12/28/23 0510 12/29/23 0454  WBC 12.9* 12.2* 10.7* 9.9 9.0  NEUTROABS 6.4  --   --   --   --   HGB 11.6* 11.0* 10.4* 10.4* 10.6*  HCT 34.1* 32.9* 31.7* 31.0* 32.5*  MCV 95.0 95.1 96.6 96.6 97.9  PLT 232 212 204 182 202   Basic Metabolic Panel: Recent Labs  Lab 12/26/23 1619 12/26/23 2346 12/27/23 0714 12/27/23 1042 12/28/23 0510 12/29/23 0454  NA 125* 128* 128* 130* 130* 135  K 5.7* 4.3 4.6 5.3* 5.0 5.1  CL 92* 99 99 100 102 107  CO2 21* 19* 21* 20* 21* 21*  GLUCOSE 99 134* 99 109* 90 98  BUN 16 15 15 15 19 21   CREATININE 0.98 1.00 0.95 1.04* 1.12* 1.10*  CALCIUM  9.8 9.2 8.6* 9.0 8.9 9.4  MG 2.1  --   --   --   --   --    Liver Function Tests: Recent Labs  Lab 12/26/23 1619 12/28/23 0510 12/29/23 0454  AST 46* 32 34  ALT 48* 38 42  ALKPHOS 109 72 73  BILITOT 0.3  0.7 0.4  PROT 6.2* 5.4* 5.5*  ALBUMIN  4.5 3.5 3.5   CBG: No results for input(s): "GLUCAP" in the last 168 hours.  Discharge time spent: less than 30 minutes.  Signed: Cherylle Corwin, MD [facility] 12/29/2023

## 2023-12-29 NOTE — TOC Transition Note (Signed)
 Transition of Care [facility]) - Discharge Note   Patient Details  Name: [name] [name] MRN: [id number] Date of Birth: 1946/01/08  Transition of Care [facility]) CM/SW Contact:  Tom-Johnson, Annelle Behrendt Daphne, RN Phone Number: 12/29/2023, 3:03 PM   Clinical Narrative:     Patient is scheduled for discharge today.  Readmission Risk Assessment done. Home health info, hospital f/u and discharge instructions on AVS. Prescriptions sent to [facility] pharmacy and patient will receive meds prior discharge. Husband, Anda Bamberg to transport at discharge.  No further TOC needs noted.         Final next level of care: Home w Home Health Services Barriers to Discharge: Barriers Resolved   Patient Goals and CMS Choice Patient states their goals for this hospitalization and ongoing recovery are:: To return home CMS Medicare.gov Compare Post Acute Care list provided to:: Patient Choice offered to / list presented to : Patient      Discharge Placement                Patient to be transferred to facility by: Husband Name of family member notified: Philip    Discharge Plan and Services Additional resources added to the After Visit Summary for                  DME Arranged: N/A DME Agency: NA       HH Arranged: PT HH Agency: [facility] Date HH Agency Contacted: 12/29/23 Time HH Agency Contacted: 1500 Representative spoke with at [facility] Agency: Imelda Man  Social Drivers of Health (SDOH) Interventions SDOH Screenings   Food Insecurity: No Food Insecurity (12/26/2023)  Housing: Low Risk  (12/26/2023)  Transportation Needs: No Transportation Needs (12/26/2023)  Utilities: Not At Risk (12/26/2023)  Depression (PHQ2-9): Low Risk  (07/21/2022)  Recent Concern: Depression (PHQ2-9) - Medium Risk (05/27/2022)  Social Connections: Moderately Integrated (12/28/2023)  Tobacco Use: Low Risk  (12/26/2023)     Readmission Risk Interventions    12/28/2023    3:59 PM  Readmission Risk Prevention Plan   Transportation Screening Complete  PCP or Specialist Appt within 3-5 Days Complete  HRI or Home Care Consult Complete  Social Work Consult for Recovery Care Planning/Counseling Complete  Palliative Care Screening Not Applicable  Medication Review [employer]) Referral to Pharmacy

## 2023-12-30 NOTE — Transitions of Care (Post Inpatient/ED Visit) (Signed)
 12/30/2023  Patient ID: [name] [name], female   DOB: 01-Feb-1946, 78 y.o.   MRN: [id number]  Medication Review for transitions of care.  See [employer] for documentation.  [name] [name] J. Anselm Aumiller RN, MSN [facility], [facility] RN Care Manager Direct Dial: [phone]  Fax: [phone] Website: [email or web address]

## 2023-12-31 ENCOUNTER — Other Ambulatory Visit (HOSPITAL_BASED_OUTPATIENT_CLINIC_OR_DEPARTMENT_OTHER)

## 2023-12-31 DIAGNOSIS — R300 Dysuria: Secondary | ICD-10-CM | POA: Diagnosis not present

## 2024-01-04 DIAGNOSIS — F419 Anxiety disorder, unspecified: Secondary | ICD-10-CM | POA: Diagnosis not present

## 2024-01-04 DIAGNOSIS — K5790 Diverticulosis of intestine, part unspecified, without perforation or abscess without bleeding: Secondary | ICD-10-CM | POA: Diagnosis not present

## 2024-01-04 DIAGNOSIS — G629 Polyneuropathy, unspecified: Secondary | ICD-10-CM | POA: Diagnosis not present

## 2024-01-04 DIAGNOSIS — M519 Unspecified thoracic, thoracolumbar and lumbosacral intervertebral disc disorder: Secondary | ICD-10-CM | POA: Diagnosis not present

## 2024-01-04 DIAGNOSIS — D72829 Elevated white blood cell count, unspecified: Secondary | ICD-10-CM | POA: Diagnosis not present

## 2024-01-04 DIAGNOSIS — N200 Calculus of kidney: Secondary | ICD-10-CM | POA: Diagnosis not present

## 2024-01-04 DIAGNOSIS — I2510 Atherosclerotic heart disease of native coronary artery without angina pectoris: Secondary | ICD-10-CM | POA: Diagnosis not present

## 2024-01-04 DIAGNOSIS — E871 Hypo-osmolality and hyponatremia: Secondary | ICD-10-CM | POA: Diagnosis not present

## 2024-01-04 DIAGNOSIS — F32A Depression, unspecified: Secondary | ICD-10-CM | POA: Diagnosis not present

## 2024-01-04 DIAGNOSIS — D630 Anemia in neoplastic disease: Secondary | ICD-10-CM | POA: Diagnosis not present

## 2024-01-04 DIAGNOSIS — E222 Syndrome of inappropriate secretion of antidiuretic hormone: Secondary | ICD-10-CM | POA: Diagnosis not present

## 2024-01-04 DIAGNOSIS — J479 Bronchiectasis, uncomplicated: Secondary | ICD-10-CM | POA: Diagnosis not present

## 2024-01-04 DIAGNOSIS — C9110 Chronic lymphocytic leukemia of B-cell type not having achieved remission: Secondary | ICD-10-CM | POA: Diagnosis not present

## 2024-01-04 DIAGNOSIS — E7800 Pure hypercholesterolemia, unspecified: Secondary | ICD-10-CM | POA: Diagnosis not present

## 2024-01-04 DIAGNOSIS — I951 Orthostatic hypotension: Secondary | ICD-10-CM | POA: Diagnosis not present

## 2024-01-04 DIAGNOSIS — K219 Gastro-esophageal reflux disease without esophagitis: Secondary | ICD-10-CM | POA: Diagnosis not present

## 2024-01-04 DIAGNOSIS — I10 Essential (primary) hypertension: Secondary | ICD-10-CM | POA: Diagnosis not present

## 2024-01-04 DIAGNOSIS — E875 Hyperkalemia: Secondary | ICD-10-CM | POA: Diagnosis not present

## 2024-01-04 DIAGNOSIS — E039 Hypothyroidism, unspecified: Secondary | ICD-10-CM | POA: Diagnosis not present

## 2024-01-04 DIAGNOSIS — C50511 Malignant neoplasm of lower-outer quadrant of right female breast: Secondary | ICD-10-CM | POA: Diagnosis not present

## 2024-01-04 DIAGNOSIS — G894 Chronic pain syndrome: Secondary | ICD-10-CM | POA: Diagnosis not present

## 2024-01-04 DIAGNOSIS — M722 Plantar fascial fibromatosis: Secondary | ICD-10-CM | POA: Diagnosis not present

## 2024-01-04 DIAGNOSIS — M797 Fibromyalgia: Secondary | ICD-10-CM | POA: Diagnosis not present

## 2024-01-04 DIAGNOSIS — I809 Phlebitis and thrombophlebitis of unspecified site: Secondary | ICD-10-CM | POA: Diagnosis not present

## 2024-01-04 DIAGNOSIS — M549 Dorsalgia, unspecified: Secondary | ICD-10-CM | POA: Diagnosis not present

## 2024-01-05 ENCOUNTER — Ambulatory Visit (HOSPITAL_BASED_OUTPATIENT_CLINIC_OR_DEPARTMENT_OTHER)

## 2024-01-05 DIAGNOSIS — I2510 Atherosclerotic heart disease of native coronary artery without angina pectoris: Secondary | ICD-10-CM | POA: Diagnosis not present

## 2024-01-05 DIAGNOSIS — R0602 Shortness of breath: Secondary | ICD-10-CM

## 2024-01-05 LAB — ECHOCARDIOGRAM COMPLETE
Area-P 1/2: 4.49 cm2
S' Lateral: 2.58 cm

## 2024-01-06 ENCOUNTER — Other Ambulatory Visit: Payer: Self-pay

## 2024-01-06 ENCOUNTER — Telehealth: Payer: Self-pay

## 2024-01-06 NOTE — Transitions of Care (Post Inpatient/ED Visit) (Signed)
 01/06/2024  Patient ID: [name] [name], female   DOB: [birth year], 78 y.o.   MRN: [id number]  Medication Review for transitions of care.  See [employer] for documentation.   Wendel Homeyer J. [name] RN, MSN [facility], [facility] RN Care Manager Direct Dial: [phone]  Fax: [phone] Website: [email or web address]

## 2024-01-06 NOTE — Progress Notes (Signed)
 Specialty Pharmacy Refill Coordination Note  [name] [name] is a 78 y.o. female contacted today regarding refills of specialty medication(s) Acalabrutinib  Maleate (Calquence )   Patient requested Delivery   Delivery date: 01/08/24   Verified address: [address] [place] [address]   Medication will be filled on 01/07/24.

## 2024-01-07 ENCOUNTER — Ambulatory Visit: Payer: Self-pay | Admitting: Cardiology

## 2024-01-13 ENCOUNTER — Telehealth: Payer: Self-pay

## 2024-01-14 ENCOUNTER — Inpatient Hospital Stay: Attending: Hematology and Oncology

## 2024-01-14 ENCOUNTER — Inpatient Hospital Stay (HOSPITAL_BASED_OUTPATIENT_CLINIC_OR_DEPARTMENT_OTHER): Admitting: Hematology and Oncology

## 2024-01-14 ENCOUNTER — Other Ambulatory Visit: Payer: Self-pay | Admitting: Hematology and Oncology

## 2024-01-14 VITALS — BP 116/62 | HR 106 | Temp 98.1°F | Resp 16 | Wt 192.7 lb

## 2024-01-14 DIAGNOSIS — Z800 Family history of malignant neoplasm of digestive organs: Secondary | ICD-10-CM | POA: Diagnosis not present

## 2024-01-14 DIAGNOSIS — C9110 Chronic lymphocytic leukemia of B-cell type not having achieved remission: Secondary | ICD-10-CM | POA: Insufficient documentation

## 2024-01-14 DIAGNOSIS — Z853 Personal history of malignant neoplasm of breast: Secondary | ICD-10-CM | POA: Diagnosis not present

## 2024-01-14 DIAGNOSIS — R0602 Shortness of breath: Secondary | ICD-10-CM | POA: Diagnosis not present

## 2024-01-14 DIAGNOSIS — Z79899 Other long term (current) drug therapy: Secondary | ICD-10-CM | POA: Insufficient documentation

## 2024-01-14 DIAGNOSIS — Z9011 Acquired absence of right breast and nipple: Secondary | ICD-10-CM | POA: Diagnosis not present

## 2024-01-14 DIAGNOSIS — C50511 Malignant neoplasm of lower-outer quadrant of right female breast: Secondary | ICD-10-CM | POA: Diagnosis not present

## 2024-01-14 DIAGNOSIS — Z7982 Long term (current) use of aspirin: Secondary | ICD-10-CM | POA: Insufficient documentation

## 2024-01-14 DIAGNOSIS — Z170 Estrogen receptor positive status [ER+]: Secondary | ICD-10-CM

## 2024-01-14 DIAGNOSIS — R5383 Other fatigue: Secondary | ICD-10-CM | POA: Diagnosis not present

## 2024-01-14 LAB — CBC WITH DIFFERENTIAL (CANCER CENTER ONLY)
Abs Immature Granulocytes: 0.04 10*3/uL (ref 0.00–0.07)
Basophils Absolute: 0.1 10*3/uL (ref 0.0–0.1)
Basophils Relative: 1 %
Eosinophils Absolute: 0.6 10*3/uL — ABNORMAL HIGH (ref 0.0–0.5)
Eosinophils Relative: 6 %
HCT: 31.9 % — ABNORMAL LOW (ref 36.0–46.0)
Hemoglobin: 10.4 g/dL — ABNORMAL LOW (ref 12.0–15.0)
Immature Granulocytes: 0 %
Lymphocytes Relative: 38 %
Lymphs Abs: 4.3 10*3/uL — ABNORMAL HIGH (ref 0.7–4.0)
MCH: 31.8 pg (ref 26.0–34.0)
MCHC: 32.6 g/dL (ref 30.0–36.0)
MCV: 97.6 fL (ref 80.0–100.0)
Monocytes Absolute: 0.8 10*3/uL (ref 0.1–1.0)
Monocytes Relative: 7 %
Neutro Abs: 5.6 10*3/uL (ref 1.7–7.7)
Neutrophils Relative %: 48 %
Platelet Count: 195 10*3/uL (ref 150–400)
RBC: 3.27 MIL/uL — ABNORMAL LOW (ref 3.87–5.11)
RDW: 14.4 % (ref 11.5–15.5)
WBC Count: 11.5 10*3/uL — ABNORMAL HIGH (ref 4.0–10.5)
nRBC: 0 % (ref 0.0–0.2)

## 2024-01-14 LAB — CMP (CANCER CENTER ONLY)
ALT: 41 U/L (ref 0–44)
AST: 33 U/L (ref 15–41)
Albumin: 4.4 g/dL (ref 3.5–5.0)
Alkaline Phosphatase: 95 U/L (ref 38–126)
Anion gap: 9 (ref 5–15)
BUN: 18 mg/dL (ref 8–23)
CO2: 23 mmol/L (ref 22–32)
Calcium: 9.4 mg/dL (ref 8.9–10.3)
Chloride: 108 mmol/L (ref 98–111)
Creatinine: 1.11 mg/dL — ABNORMAL HIGH (ref 0.44–1.00)
GFR, Estimated: 51 mL/min — ABNORMAL LOW (ref >60)
Glucose, Bld: 84 mg/dL (ref 70–99)
Potassium: 4.6 mmol/L (ref 3.5–5.1)
Sodium: 140 mmol/L (ref 135–145)
Total Bilirubin: 0.4 mg/dL (ref 0.0–1.2)
Total Protein: 6.4 g/dL — ABNORMAL LOW (ref 6.5–8.1)

## 2024-01-14 LAB — LACTATE DEHYDROGENASE: LDH: 150 U/L (ref 98–192)

## 2024-01-14 NOTE — Progress Notes (Signed)
 [facility] [phone]   [phone]  PROGRESS NOTE  Patient Care Team: Imelda Man, MD as PCP - General (Internal Medicine) [name], MD as PCP - Cardiology (Cardiology) Janel Medford, MD (Inactive) as Attending Physician (Gastroenterology) Lillian Rein, MD as Consulting Physician (Gynecology) Dareen Ebbing, MD as Consulting Physician (General Surgery) Linde Reveal, NP as Nurse Practitioner Adonica Hoose, MD as Consulting Physician (Orthopedic Surgery) Hazle Lites, MD as Consulting Physician (Orthopedic Surgery) Forest Idol, MD as Attending Physician (Radiology) Jennefer Moats, MD as Referring Physician (Orthopedic Surgery) Ander Bame, MD as Consulting Physician (Hematology and Oncology) Leath, Dionne, RN as [facility] Care Management  Hematological/Oncological History # CLL Rai Stage 1  06/04/2021: last visit with Dr. Charolett Copes. Detailed history of his care history noted below.  09/18/2021: WBC 43.3, Hgb 13.0, MCV 87.1, Plt 275 09/23/2021: establish care with Dr. Rosaline Coma. WBC 30.1, Hgb 12.3, MCV 87.8, Plt 241 03/11/2022: WBC 57.4, Hgb 11.2, MCV 88.3, Plt 181  04/29/2022: Started acalabrutinib  100 mg PO twice daily  Interval History:  [name] [name] 78 y.o. 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 The patient's last visit was on 06/19/2023. In the interim since the last visit, she has continued on Calquence  therapy without difficulty  On exam today [name] [name] is accompanied by her husband. She reports she did have a recent hospitalization where she had a big drop in her sodium levels.  This was thought to be secondary to her antidepressant medications and the dosages have been changed.  She was temporarily off the Calquence  for about 2 to 3 days.  She does continue to experience some fatigue particular on exertion she gets short of breath.  She  reports that she continues taking her Calquence  faithfully other than interruption while she was hospitalized.  She is not having any light headedness or dizziness.  She denies any bleeding, palpitations.  She reports no recent infectious symptoms such as fevers, chills, sweats.  She is working with physical therapy at home.  Otherwise she denies any new symptoms.  A full 10 point ROS is otherwise negative.   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   DDD (degenerative disc disease)    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 ear    Depression    Diverticulosis    Elevated LFTs    Fibromyalgia 1978   GERD (gastroesophageal reflux disease)    Hematuria    History of syncope    episode 2008--  no recurrence since   HLD (hyperlipidemia)    Hypothyroidism    Kidney stones 2014   Plantar fasciitis    Ruptured disk    one in neck and two in back   S/P chemotherapy, time since greater than 12 weeks    Sinus tachycardia    mild resting   Skin cancer    nose   Stool incontinence    "at times recently"     SURGICAL HISTORY: Past Surgical History:  Procedure Laterality Date   ANTERIOR LATERAL LUMBAR FUSION 4 LEVELS Left 04/25/2016  Procedure: LEFT LUMBAR ONE-TWO, LUMBAR TWO-THREE, LUMBAR THREE-FOUR, LUMBAR FOUR-FIVE ANTERIOR LATERAL LUMBAR FUSION;  Surgeon: [name], MD;  Location: [facility];  Service: Neurosurgery;  Laterality: Left;   APPENDECTOMY  1997   APPLICATION OF ROBOTIC ASSISTANCE FOR SPINAL PROCEDURE N/A 04/06/2017   Procedure: APPLICATION OF ROBOTIC ASSISTANCE FOR SPINAL PROCEDURE;  Surgeon: [name], MD;  Location: [facility];  Service: Neurosurgery;  Laterality: N/A;   BREAST BIOPSY  Right 2014   BREAST LUMPECTOMY Right 1997   BREAST LUMPECTOMY WITH AXILLARY LYMPH NODE DISSECTION Right 1997   CATARACT EXTRACTION, BILATERAL  2019   Dr. Reyne Cave   CYSTOSCOPY WITH RETROGRADE PYELOGRAM, URETEROSCOPY AND STENT PLACEMENT Bilateral 06/17/2013   Procedure: CYSTOSCOPY WITH RETROGRADE PYELOGRAM, URETEROSCOPY AND LEFT DOUBLE  J STENT PLACEMENT RIGHT URETERAL HOLMIIUM LASER AND DOUBLE J STENT ;  Surgeon: Jinny Mounts, MD;  Location: [facility];  Service: Urology;  Laterality: Bilateral;   HOLMIUM LASER APPLICATION Bilateral 06/17/2013   Procedure: HOLMIUM LASER APPLICATION;  Surgeon: Jinny Mounts, MD;  Location: [facility];  Service: Urology;  Laterality: Bilateral;   LAMINECTOMY WITH POSTERIOR LATERAL ARTHRODESIS LEVEL 3 N/A 02/02/2023   Procedure: Thoracic six - Thoracic nine Posterior lateral fusion;  Surgeon: [name], MD;  Location: [facility];  Service: Neurosurgery;  Laterality: N/A;   LITHOTRIPSY Left 06/2013   LUMBAR EPIDURAL INJECTION     has had 7 injections   PORT-A-CATH REMOVAL Left 10/03/2014   Procedure: REMOVAL PORT-A-CATH;  Surgeon: Dareen Ebbing, MD;  Location: [facility];  Service: General;  Laterality: Left;   PORTACATH PLACEMENT Left 07/28/2013   Procedure: ATTEMPTED INSERTION PORT-A-CATH;  Surgeon: Kari Otto. Eli Grizzle, MD;  Location: [facility];  Service: General;  Laterality: Left;   POSTERIOR LUMBAR FUSION 4 LEVEL N/A 04/25/2016   Procedure: LUMBAR FIVE-SACRAL ONE POSTERIOR LUMBAR INTERBODY FUSION, THORACIC NINE-SACRAL ONE POSTERIOR LATERAL ARTHRODESIS WITH PEDICLE SCREWS;  Surgeon: [name], MD;  Location: [facility];  Service: Neurosurgery;  Laterality: N/A;   RETINAL DETACHMENT SURGERY Right 2013   ROBOTIC ASSITED PARTIAL NEPHRECTOMY Right 02/05/2015   Procedure: ROBOTIC ASSITED PARTIAL NEPHRECTOMY;  Surgeon: Florencio Hunting, MD;  Location: [facility];  Service: Urology;  Laterality: Right;   SIMPLE MASTECTOMY WITH AXILLARY  SENTINEL NODE BIOPSY Right 07/28/2013   Procedure: RIGHT TOTAL  MASTECTOMY;  Surgeon: Kari Otto. Eli Grizzle, MD;  Location: [facility];  Service: General;  Laterality: Right;   TONSILLECTOMY  AGE 53   TOTAL ABDOMINAL HYSTERECTOMY W/ BILATERAL SALPINGOOPHORECTOMY  1997    SOCIAL HISTORY: Social History   Socioeconomic History   Marital status: Married    Spouse name: Philip   Number of children: 1   Years of education: Not on file   Highest education level: Not on file  Occupational History   Occupation: homemaker  Tobacco Use   Smoking status: Never   Smokeless tobacco: Never  Vaping Use   Vaping status: Never Used  Substance and Sexual Activity   Alcohol use: No   Drug use: No   Sexual activity: Not Currently    Partners: Male    Birth control/protection: Surgical    Comment: TAH/BSO  Other Topics Concern   Not on file  Social History Narrative   Not on file   Social Drivers of Health   Financial Resource Strain: Not on file  Food Insecurity: No Food Insecurity (12/26/2023)   Hunger Vital Sign    Worried About Running Out of Food in the Last Year: Never  true    Ran Out of Food in the Last Year: Never true  Transportation Needs: No Transportation Needs (12/26/2023)   PRAPARE - Administrator, Civil Service (Medical): No    Lack of Transportation (Non-Medical): No  Physical Activity: Not on file  Stress: Not on file  Social Connections: Moderately Integrated (12/28/2023)   Social Connection and Isolation Panel [NHANES]    Frequency of Communication with Friends and Family: Twice a week    Frequency of Social Gatherings with Friends and Family: Twice a week    Attends Religious Services: 1 to 4 times per year    Active Member of [employer] or Organizations: No    Attends Banker Meetings: Never    Marital Status: Married  Catering manager Violence: Not At Risk (12/26/2023)   Humiliation, Afraid, Rape, and Kick questionnaire    Fear of Current or Ex-Partner: No     Emotionally Abused: No    Physically Abused: No    Sexually Abused: No    FAMILY HISTORY: Family History  Problem Relation Age of Onset   Heart disease Maternal Grandfather    Diabetes Maternal Grandfather    Colon cancer Maternal Aunt 11   Brain cancer Paternal Grandmother        dx in 5s   Dementia Mother    Diabetes Mother    Osteoporosis Mother    Diabetes Maternal Aunt    Prostate cancer Father 11   Bipolar disorder Maternal Aunt    Stroke Maternal Aunt    Stomach cancer Paternal Uncle        dx in late 61s    ALLERGIES:  is allergic to furosemide, sulfa antibiotics, trazodone and nefazodone, and lyrica [pregabalin].  MEDICATIONS:  Current Outpatient Medications  Medication Sig Dispense Refill   acalabrutinib  maleate (CALQUENCE ) 100 MG tablet Take 1 tablet (100 mg total) by mouth 2 (two) times daily. 60 tablet 2   allopurinol  (ZYLOPRIM ) 300 MG tablet TAKE 1 TABLET(300 MG) BY MOUTH DAILY (Patient taking differently: Take 300 mg by mouth daily.) 90 tablet 4   amitriptyline  (ELAVIL ) 25 MG tablet Take 2 tablets (50 mg total) by mouth at bedtime. 60 tablet 0   aspirin  81 MG EC tablet Take 81 mg by mouth daily.     brexpiprazole  (REXULTI ) 1 MG TABS tablet Take 1 mg by mouth daily.     budesonide -formoterol  (SYMBICORT ) 160-4.5 MCG/ACT inhaler Inhale 2 puffs into the lungs 2 (two) times daily. 3 each 3   buPROPion  (WELLBUTRIN  XL) 300 MG 24 hr tablet Take 300 mg by mouth daily.     Cholecalciferol  (VITAMIN D3) 50 MCG (2000 UT) TABS Take 2,000 Units by mouth daily.     Cyanocobalamin  (VITAMIN B12) 1000 MCG TBCR Take 1,000 Units by mouth daily.     ferrous sulfate  325 (65 FE) MG tablet Take 325 mg by mouth daily with breakfast.     fluvoxaMINE  (LUVOX ) 50 MG tablet Take 1.5 tablets (75 mg total) by mouth at bedtime. 45 tablet 0   gabapentin  (NEURONTIN ) 100 MG capsule Take 100-300 mg by mouth 2 (two) times daily. Take one capsule by mouth in the morning. Take three capsules by  mouth before bedtime     levothyroxine  (SYNTHROID ) 100 MCG tablet Take 100 mcg by mouth daily.     Multiple Vitamins-Minerals (WOMENS 50+ MULTI VITAMIN/MIN) TABS Take 1 tablet by mouth daily.     omeprazole  (PRILOSEC) 40 MG capsule TAKE 1 CAPSULE(40 MG) BY MOUTH DAILY  AFTER BREAKFAST (Patient taking differently: Take 40 mg by mouth daily.) 90 capsule 0   rosuvastatin  (CRESTOR ) 5 MG tablet TAKE 1 TABLET(5 MG) BY MOUTH DAILY (Patient taking differently: Take 5 mg by mouth daily.) 30 tablet 3   sodium chloride  1 g tablet Take 1 g by mouth 2 (two) times daily.     No current facility-administered medications for this visit.    REVIEW OF SYSTEMS:   Constitutional: ( - ) fevers, ( - )  chills , ( - ) night sweats Eyes: ( - ) blurriness of vision, ( - ) double vision, ( - ) watery eyes Ears, nose, mouth, throat, and face: ( - ) mucositis, ( - ) sore throat Respiratory: ( - ) cough, ( - ) dyspnea, ( - ) wheezes Cardiovascular: ( - ) palpitation, ( - ) chest discomfort, ( - ) lower extremity swelling Gastrointestinal:  ( - ) nausea, ( - ) heartburn, ( - ) change in bowel habits Skin: ( - ) abnormal skin rashes Lymphatics: ( - ) new lymphadenopathy, ( - ) easy bruising Neurological: ( - ) numbness, ( - ) tingling, ( - ) new weaknesses Behavioral/Psych: ( - ) mood change, ( - ) new changes  All other systems were reviewed with the patient and are negative.  PHYSICAL EXAMINATION:  Vitals:   01/14/24 1538  BP: 116/62  Pulse: (!) 106  Resp: 16  Temp: 98.1 F (36.7 C)  SpO2: 98%    Filed Weights   01/14/24 1538  Weight: 192 lb 11.2 oz (87.4 kg)     GENERAL: well appearing elderly Caucasian female. alert, no distress and comfortable SKIN:  skin color, texture, turgor are normal, no rashes or significant lesions EYES: conjunctiva are pink and non-injected, sclera clear LUNGS: clear to auscultation and percussion with normal breathing effort HEART: regular rate & rhythm and no murmurs and  no lower extremity edema Musculoskeletal: no cyanosis of digits and no clubbing  PSYCH: alert & oriented x 3, fluent speech NEURO: no focal motor/sensory deficits  LABORATORY DATA:  I have reviewed the data as listed    Latest Ref Rng & Units 01/14/2024    3:03 PM 12/29/2023    4:54 AM 12/28/2023    5:10 AM  CBC  WBC 4.0 - 10.5 K/uL 11.5  9.0  9.9   Hemoglobin 12.0 - 15.0 g/dL 16.1  09.6  04.5   Hematocrit 36.0 - 46.0 % 31.9  32.5  31.0   Platelets 150 - 400 K/uL 195  202  182        Latest Ref Rng & Units 01/14/2024    3:03 PM 12/29/2023    4:54 AM 12/28/2023    5:10 AM  CMP  Glucose 70 - 99 mg/dL 84  98  90   BUN 8 - 23 mg/dL 18  21  19    Creatinine 0.44 - 1.00 mg/dL 4.09  8.11  9.14   Sodium 135 - 145 mmol/L 140  135  130   Potassium 3.5 - 5.1 mmol/L 4.6  5.1  5.0   Chloride 98 - 111 mmol/L 108  107  102   CO2 22 - 32 mmol/L 23  21  21    Calcium  8.9 - 10.3 mg/dL 9.4  9.4  8.9   Total Protein 6.5 - 8.1 g/dL 6.4  5.5  5.4   Total Bilirubin 0.0 - 1.2 mg/dL 0.4  0.4  0.7   Alkaline Phos 38 - 126 U/L 95  73  72   AST 15 - 41 U/L 33  34  32   ALT 0 - 44 U/L 41  42  38     RADIOGRAPHIC STUDIES: ECHOCARDIOGRAM COMPLETE Result Date: 01/05/2024    ECHOCARDIOGRAM REPORT   Patient Name:   Georganne K Swier Date of Exam: 01/05/2024 Medical Rec #:  [id number]        Height:       63.0 in Accession #:    [id number]       Weight:       195.0 lb Date of Birth:  1945/09/01         BSA:          1.913 m Patient Age:    78 years         BP:           110/70 mmHg Patient Gender: F                HR:           94 bpm. Exam Location:  Outpatient Procedure: 2D Echo, 3D Echo, Cardiac Doppler, Color Doppler and Strain Analysis            (Both Spectral and Color Flow Doppler were utilized during            procedure). Indications:    Dyspnea  History:        Patient has prior history of Echocardiogram examinations, most                 recent 05/03/2019. Arrythmias:Tachycardia; Risk                  Factors:Non-Smoker, Dyslipidemia and Hypertension.  Sonographer:    Gelene Kelly RDCS Referring Phys: [id number] MARK C SKAINS IMPRESSIONS  1. Left ventricular ejection fraction, by estimation, is 60 to 65%. The left ventricle has normal function. The left ventricle has no regional wall motion abnormalities. Left ventricular diastolic parameters are consistent with Grade I diastolic dysfunction (impaired relaxation).  2. Right ventricular systolic function is normal. The right ventricular size is normal. There is normal pulmonary artery systolic pressure. The estimated right ventricular systolic pressure is 19.3 mmHg.  3. The mitral valve is normal in structure. No evidence of mitral valve regurgitation. No evidence of mitral stenosis.  4. The aortic valve is normal in structure. There is mild calcification of the aortic valve. There is mild thickening of the aortic valve. Aortic valve regurgitation is not visualized. Aortic valve sclerosis/calcification is present, without any evidence of aortic stenosis.  5. The inferior vena cava is normal in size with greater than 50% respiratory variability, suggesting right atrial pressure of 3 mmHg. Comparison(s): No significant change from prior study. Prior images reviewed side by side. FINDINGS  Left Ventricle: Left ventricular ejection fraction, by estimation, is 60 to 65%. The left ventricle has normal function. The left ventricle has no regional wall motion abnormalities. Global longitudinal strain performed but not reported based on interpreter judgement due to suboptimal tracking. 3D ejection fraction reviewed and evaluated as part of the interpretation. Alternate measurement of EF is felt to be most reflective of LV function. The left ventricular internal cavity size was normal in  size. There is no left ventricular hypertrophy. Left ventricular diastolic parameters are consistent with Grade I diastolic dysfunction (impaired relaxation). Indeterminate filling pressures.  Right Ventricle: The right ventricular size is normal. No increase in right ventricular wall thickness. Right ventricular systolic function is normal. There  is normal pulmonary artery systolic pressure. The tricuspid regurgitant velocity is 2.02 m/s, and  with an assumed right atrial pressure of 3 mmHg, the estimated right ventricular systolic pressure is 19.3 mmHg. Left Atrium: Left atrial size was normal in size. Right Atrium: Right atrial size was normal in size. Pericardium: There is no evidence of pericardial effusion. Mitral Valve: The mitral valve is normal in structure. No evidence of mitral valve regurgitation. No evidence of mitral valve stenosis. Tricuspid Valve: The tricuspid valve is normal in structure. Tricuspid valve regurgitation is not demonstrated. No evidence of tricuspid stenosis. Aortic Valve: The aortic valve is normal in structure. There is mild calcification of the aortic valve. There is mild thickening of the aortic valve. Aortic valve regurgitation is not visualized. Aortic valve sclerosis/calcification is present, without any evidence of aortic stenosis. Pulmonic Valve: The pulmonic valve was normal in structure. Pulmonic valve regurgitation is not visualized. No evidence of pulmonic stenosis. Aorta: The aortic root is normal in size and structure. Venous: The inferior vena cava is normal in size with greater than 50% respiratory variability, suggesting right atrial pressure of 3 mmHg. IAS/Shunts: No atrial level shunt detected by color flow Doppler. Additional Comments: 3D was performed not requiring image post processing on an independent workstation and was indeterminate.  LEFT VENTRICLE PLAX 2D LVIDd:         4.63 cm   Diastology LVIDs:         2.58 cm   LV e' medial:    5.22 cm/s LV PW:         0.86 cm   LV E/e' medial:  12.8 LV IVS:        0.75 cm   LV e' lateral:   5.22 cm/s LVOT diam:     1.90 cm   LV E/e' lateral: 12.8 LV SV:         36 LV SV Index:   19        2D Longitudinal  Strain LVOT Area:     2.84 cm  2D Strain GLS (A4C):   -15.4 %                          2D Strain GLS (A3C):   -21.5 %                          2D Strain GLS (A2C):   -12.1 %                          2D Strain GLS Avg:     -16.3 %                           3D Volume EF:                          3D EF:        52 %                          LV EDV:       97 ml                          LV ESV:       47 ml  LV SV:        50 ml RIGHT VENTRICLE RV Basal diam:  3.59 cm RV Mid diam:    3.52 cm RV S prime:     20.40 cm/s TAPSE (M-mode): 3.5 cm LEFT ATRIUM             Index        RIGHT ATRIUM           Index LA diam:        3.50 cm 1.83 cm/m   RA Area:     12.60 cm LA Vol (A2C):   37.3 ml 19.50 ml/m  RA Volume:   30.60 ml  16.00 ml/m LA Vol (A4C):   28.0 ml 14.64 ml/m LA Biplane Vol: 32.3 ml 16.88 ml/m  AORTIC VALVE LVOT Vmax:   79.70 cm/s LVOT Vmean:  51.900 cm/s LVOT VTI:    0.127 m  AORTA Ao Root diam: 2.60 cm Ao Asc diam:  2.90 cm MITRAL VALVE               TRICUSPID VALVE MV Area (PHT): 4.49 cm    TR Peak grad:   16.3 mmHg MV Decel Time: 169 msec    TR Vmax:        202.00 cm/s MV E velocity: 66.60 cm/s MV A velocity: 90.10 cm/s  SHUNTS MV E/A ratio:  0.74        Systemic VTI:  0.13 m                            Systemic Diam: 1.90 cm Karyl Paget Croitoru MD Electronically signed by Luana Rumple MD Signature Date/Time: 01/05/2024/2:34:22 PM    Final    DG Chest Port 1 View Result Date: 12/26/2023 CLINICAL DATA:  Bilateral leg weakness for the past 3-4 weeks. History of CLL and breast cancer. EXAM: PORTABLE CHEST 1 VIEW COMPARISON:  09/03/2022 FINDINGS: Normal sized heart. Clear lungs with normal vascularity. Extensive thoracic and upper lumbar spine fixation hardware. IMPRESSION: No acute abnormality. Electronically Signed   By: Catherin Closs M.D.   On: 12/26/2023 16:24    ASSESSMENT & PLAN [name] [name] is a 78 y.o. female with medical history significant for CLL and remote ER+ breast  cancer who presents for a follow up visit.  History Adapted from Dr. Mikel Alderman last note:   (1) status post right breast lumpectomy and axillary lymph node dissection in 1997 for a stage I breast cancer, treated with adjuvant radiation and tamoxifen for 5 years   (2) status post right breast lower outer quadrant biopsy 07/13/2013 for a clinical T1c N0, stage IA invasive ductal carcinoma, grade 2, estrogen receptor 100% positive, progesterone receptor 13% positive, with an MIB-1 of 33%, and HER-2 amplification by CISH with a HER2/CEP 17 ratio of 3.19, and an average HER-2 copy number per cell of 4.15   (3) status post right mastectomy 07/28/2013 for a pT1c pN0, stage IA invasive ductal carcinoma, grade 3, with close but negative margins. Prognostic panel was not repeated             (a) the patient met with Dr. Hobart Lulas and has decided against reconstruction   (4) completed weekly paclitaxel  x12 12/06/2913, with trastuzumab / pertuzumab  every 3 weeks; pertuzumab  was held with third dose on 11/01/2013 due to diarrhea, tried a half dose on cycle 4 again with diarrhea developing   (5) trastuzumab  (started 09/20/2013) continued for 1 year, last dose 09/21/2014;             (  a) final echocardiogram 09/07/2014 showed an ejection fraction of 55-60%   (6) anastrozole  started May 2015; completed April 2020             (a) bone density 12/15/2013 normal             (b) bone density 02/01/2016 at [place] was normal with a T score of -1.0     OTHER PROBLEMS: (a) History of chronic lymphoid leukemia diagnosed by flow cytometry 06/29/2013, the cells being CD5, CD20 and CD23 positive, CD10 negative.              (1) right renal mass resected on 02/05/15, consisting of atypical lymphoid proliferation composed of monotonous small lymphocytes              (2) Anemia with a normal MCV and normal ferritin-- B-12 and folate normal; stable             (3)  ibrutinib  280 milligrams daily started 03/26/2019, discontinued  06/03/2019 because of concerns regarding dosing             (4) rituximab  weekly x8 started 06/15/2019 completed December 2020             (5) maintenance rituximab  (Q 2 months) started 10/25/2019, continued through 08/03/2020  (6) 04/29/2022: Started acalabrutinib  100 mg PO twice daily   # CLL Rai Stage I --Due to rapid doubling time of white blood cell count from 28.3 on 10/07/2021 to 66.7 on 04/09/2022, recommendation was to initiate therapy with acalabrutinib  100 mg twice daily, started on 04/29/2022 --Last imaging of the abdomen in 2022 showed no evidence of splenomegaly Plan:  --Labs today show WBC 11.5, hemoglobin 10.4, MCV 97.6, platelets 195.  Creatinine and LFTs are normal.  --Continue on acalabrutinib  at 100 mg twice daily. --RTC in 12 weeks for clinic visit with interval 1 month lab visit.  #Head throbbing-resolved.  --Patient reports throbbing sensation without headaches. Likely secondary to acalabrutinib . After temporarily holding medication, symptoms have improved.  --Advised that headaches respond well to caffeine and trying to drink a caffeinated beverage. --Monitor for now and advised to follow up if symptoms worsen.  # ER + Breast Cancer in Survivorship -- Continue yearly mammograms. --last mammogram on 06/09/2022, next due in October 2024.  --Currently in survivorship.  No orders of the defined types were placed in this encounter.  All questions were answered. The patient knows to call the clinic with any problems, questions or concerns.  I have spent a total of 30 minutes minutes of face-to-face and non-face-to-face time, preparing to see the patient, performing a medically appropriate examination, counseling and educating the patient, ordering tests/procedures, documenting clinical information in the electronic health record, and care coordination.   Rogerio Clay, MD Department of Hematology/Oncology [facility] Phone:  [phone] Pager: [phone] Email: [email or web address]  01/14/2024 4:06 PM

## 2024-01-15 ENCOUNTER — Other Ambulatory Visit (HOSPITAL_COMMUNITY): Payer: Self-pay

## 2024-01-15 NOTE — Transitions of Care (Post Inpatient/ED Visit) (Signed)
 01/15/2024  Patient ID: [name] [name], female   DOB: 1946-07-27, 78 y.o.   MRN: [id number]  Medication Review for transitions of care.  See [facility] for  documentation.  Arren Laminack J. Aizen Duval RN, MSN [facility], [facility] RN Care Manager Direct Dial: [phone]  Fax: [phone] Website: [email or web address]

## 2024-01-19 DIAGNOSIS — M722 Plantar fascial fibromatosis: Secondary | ICD-10-CM | POA: Diagnosis not present

## 2024-01-19 DIAGNOSIS — F419 Anxiety disorder, unspecified: Secondary | ICD-10-CM | POA: Diagnosis not present

## 2024-01-19 DIAGNOSIS — G894 Chronic pain syndrome: Secondary | ICD-10-CM | POA: Diagnosis not present

## 2024-01-19 DIAGNOSIS — G629 Polyneuropathy, unspecified: Secondary | ICD-10-CM | POA: Diagnosis not present

## 2024-01-19 DIAGNOSIS — F32A Depression, unspecified: Secondary | ICD-10-CM | POA: Diagnosis not present

## 2024-01-19 DIAGNOSIS — M797 Fibromyalgia: Secondary | ICD-10-CM | POA: Diagnosis not present

## 2024-01-19 DIAGNOSIS — K219 Gastro-esophageal reflux disease without esophagitis: Secondary | ICD-10-CM | POA: Diagnosis not present

## 2024-01-19 DIAGNOSIS — I2510 Atherosclerotic heart disease of native coronary artery without angina pectoris: Secondary | ICD-10-CM | POA: Diagnosis not present

## 2024-01-19 DIAGNOSIS — C9110 Chronic lymphocytic leukemia of B-cell type not having achieved remission: Secondary | ICD-10-CM | POA: Diagnosis not present

## 2024-01-19 DIAGNOSIS — E7800 Pure hypercholesterolemia, unspecified: Secondary | ICD-10-CM | POA: Diagnosis not present

## 2024-01-19 DIAGNOSIS — E871 Hypo-osmolality and hyponatremia: Secondary | ICD-10-CM | POA: Diagnosis not present

## 2024-01-19 DIAGNOSIS — I809 Phlebitis and thrombophlebitis of unspecified site: Secondary | ICD-10-CM | POA: Diagnosis not present

## 2024-01-19 DIAGNOSIS — I10 Essential (primary) hypertension: Secondary | ICD-10-CM | POA: Diagnosis not present

## 2024-01-19 DIAGNOSIS — I951 Orthostatic hypotension: Secondary | ICD-10-CM | POA: Diagnosis not present

## 2024-01-19 DIAGNOSIS — N200 Calculus of kidney: Secondary | ICD-10-CM | POA: Diagnosis not present

## 2024-01-19 DIAGNOSIS — D72829 Elevated white blood cell count, unspecified: Secondary | ICD-10-CM | POA: Diagnosis not present

## 2024-01-19 DIAGNOSIS — D630 Anemia in neoplastic disease: Secondary | ICD-10-CM | POA: Diagnosis not present

## 2024-01-19 DIAGNOSIS — C50511 Malignant neoplasm of lower-outer quadrant of right female breast: Secondary | ICD-10-CM | POA: Diagnosis not present

## 2024-01-19 DIAGNOSIS — E222 Syndrome of inappropriate secretion of antidiuretic hormone: Secondary | ICD-10-CM | POA: Diagnosis not present

## 2024-01-19 DIAGNOSIS — J479 Bronchiectasis, uncomplicated: Secondary | ICD-10-CM | POA: Diagnosis not present

## 2024-01-19 DIAGNOSIS — M549 Dorsalgia, unspecified: Secondary | ICD-10-CM | POA: Diagnosis not present

## 2024-01-19 DIAGNOSIS — K5790 Diverticulosis of intestine, part unspecified, without perforation or abscess without bleeding: Secondary | ICD-10-CM | POA: Diagnosis not present

## 2024-01-19 DIAGNOSIS — E039 Hypothyroidism, unspecified: Secondary | ICD-10-CM | POA: Diagnosis not present

## 2024-01-19 DIAGNOSIS — M519 Unspecified thoracic, thoracolumbar and lumbosacral intervertebral disc disorder: Secondary | ICD-10-CM | POA: Diagnosis not present

## 2024-01-19 DIAGNOSIS — E875 Hyperkalemia: Secondary | ICD-10-CM | POA: Diagnosis not present

## 2024-01-20 ENCOUNTER — Telehealth: Payer: Self-pay

## 2024-01-20 NOTE — Transitions of Care (Post Inpatient/ED Visit) (Signed)
 01/20/2024  Patient ID: [name] [name], female   DOB: Aug 12, 1946, 78 y.o.   MRN: [id number]  Medication Review for transitions of care.  See [employer] for  documentation.  Naquan Garman J. Calib Wadhwa RN, MSN [facility], [facility] RN Care Manager Direct Dial: [phone]  Fax: [phone] Website: [email or web address]

## 2024-01-22 DIAGNOSIS — C50511 Malignant neoplasm of lower-outer quadrant of right female breast: Secondary | ICD-10-CM | POA: Diagnosis not present

## 2024-01-22 DIAGNOSIS — E871 Hypo-osmolality and hyponatremia: Secondary | ICD-10-CM | POA: Diagnosis not present

## 2024-01-22 DIAGNOSIS — I951 Orthostatic hypotension: Secondary | ICD-10-CM | POA: Diagnosis not present

## 2024-01-22 DIAGNOSIS — D630 Anemia in neoplastic disease: Secondary | ICD-10-CM | POA: Diagnosis not present

## 2024-01-22 DIAGNOSIS — M519 Unspecified thoracic, thoracolumbar and lumbosacral intervertebral disc disorder: Secondary | ICD-10-CM | POA: Diagnosis not present

## 2024-01-22 DIAGNOSIS — E7800 Pure hypercholesterolemia, unspecified: Secondary | ICD-10-CM | POA: Diagnosis not present

## 2024-01-22 DIAGNOSIS — K219 Gastro-esophageal reflux disease without esophagitis: Secondary | ICD-10-CM | POA: Diagnosis not present

## 2024-01-22 DIAGNOSIS — D72829 Elevated white blood cell count, unspecified: Secondary | ICD-10-CM | POA: Diagnosis not present

## 2024-01-22 DIAGNOSIS — M722 Plantar fascial fibromatosis: Secondary | ICD-10-CM | POA: Diagnosis not present

## 2024-01-22 DIAGNOSIS — E039 Hypothyroidism, unspecified: Secondary | ICD-10-CM | POA: Diagnosis not present

## 2024-01-22 DIAGNOSIS — G629 Polyneuropathy, unspecified: Secondary | ICD-10-CM | POA: Diagnosis not present

## 2024-01-22 DIAGNOSIS — E222 Syndrome of inappropriate secretion of antidiuretic hormone: Secondary | ICD-10-CM | POA: Diagnosis not present

## 2024-01-22 DIAGNOSIS — G894 Chronic pain syndrome: Secondary | ICD-10-CM | POA: Diagnosis not present

## 2024-01-22 DIAGNOSIS — F32A Depression, unspecified: Secondary | ICD-10-CM | POA: Diagnosis not present

## 2024-01-22 DIAGNOSIS — I809 Phlebitis and thrombophlebitis of unspecified site: Secondary | ICD-10-CM | POA: Diagnosis not present

## 2024-01-22 DIAGNOSIS — C9110 Chronic lymphocytic leukemia of B-cell type not having achieved remission: Secondary | ICD-10-CM | POA: Diagnosis not present

## 2024-01-22 DIAGNOSIS — E875 Hyperkalemia: Secondary | ICD-10-CM | POA: Diagnosis not present

## 2024-01-22 DIAGNOSIS — F419 Anxiety disorder, unspecified: Secondary | ICD-10-CM | POA: Diagnosis not present

## 2024-01-22 DIAGNOSIS — M797 Fibromyalgia: Secondary | ICD-10-CM | POA: Diagnosis not present

## 2024-01-22 DIAGNOSIS — J479 Bronchiectasis, uncomplicated: Secondary | ICD-10-CM | POA: Diagnosis not present

## 2024-01-22 DIAGNOSIS — I2510 Atherosclerotic heart disease of native coronary artery without angina pectoris: Secondary | ICD-10-CM | POA: Diagnosis not present

## 2024-01-22 DIAGNOSIS — M549 Dorsalgia, unspecified: Secondary | ICD-10-CM | POA: Diagnosis not present

## 2024-01-22 DIAGNOSIS — K5790 Diverticulosis of intestine, part unspecified, without perforation or abscess without bleeding: Secondary | ICD-10-CM | POA: Diagnosis not present

## 2024-01-22 DIAGNOSIS — N200 Calculus of kidney: Secondary | ICD-10-CM | POA: Diagnosis not present

## 2024-01-22 DIAGNOSIS — I10 Essential (primary) hypertension: Secondary | ICD-10-CM | POA: Diagnosis not present

## 2024-01-26 DIAGNOSIS — R7989 Other specified abnormal findings of blood chemistry: Secondary | ICD-10-CM | POA: Diagnosis not present

## 2024-01-26 DIAGNOSIS — I2584 Coronary atherosclerosis due to calcified coronary lesion: Secondary | ICD-10-CM | POA: Diagnosis not present

## 2024-01-26 DIAGNOSIS — E871 Hypo-osmolality and hyponatremia: Secondary | ICD-10-CM | POA: Diagnosis not present

## 2024-01-26 DIAGNOSIS — E039 Hypothyroidism, unspecified: Secondary | ICD-10-CM | POA: Diagnosis not present

## 2024-01-27 ENCOUNTER — Other Ambulatory Visit: Payer: Self-pay

## 2024-01-27 DIAGNOSIS — D630 Anemia in neoplastic disease: Secondary | ICD-10-CM | POA: Diagnosis not present

## 2024-01-27 DIAGNOSIS — E222 Syndrome of inappropriate secretion of antidiuretic hormone: Secondary | ICD-10-CM

## 2024-01-27 DIAGNOSIS — E871 Hypo-osmolality and hyponatremia: Secondary | ICD-10-CM | POA: Diagnosis not present

## 2024-01-27 DIAGNOSIS — C9110 Chronic lymphocytic leukemia of B-cell type not having achieved remission: Secondary | ICD-10-CM | POA: Diagnosis not present

## 2024-01-27 DIAGNOSIS — C50511 Malignant neoplasm of lower-outer quadrant of right female breast: Secondary | ICD-10-CM | POA: Diagnosis not present

## 2024-01-27 NOTE — Transitions of Care (Post Inpatient/ED Visit) (Signed)
  Transition of Care Final Call  Visit Note  01/27/2024  Name: [name] [name] MRN: [id number]          DOB: February 05, 1946  Situation: Patient enrolled in [facility] 30-day program. Visit completed with Stashia Staller by telephone.   Background:    There were no vitals filed for this visit.  Medications Reviewed Today     Reviewed by Claudene Crystal, RN (Case Manager) on 01/27/24 at 1459  Med List Status: <None>   Medication Order Taking? Sig Documenting Provider Last Dose Status Informant  acalabrutinib  maleate (CALQUENCE ) 100 MG tablet [id number] No Take 1 tablet (100 mg total) by mouth 2 (two) times daily. Ander Bame, MD Taking Active   allopurinol  (ZYLOPRIM ) 300 MG tablet [id number] No TAKE 1 TABLET(300 MG) BY MOUTH DAILY  Patient taking differently: Take 300 mg by mouth daily.   Ander Bame, MD Taking Active   amitriptyline  (ELAVIL ) 25 MG tablet [id number] No Take 2 tablets (50 mg total) by mouth at bedtime. Oral Billings, MD Taking Active   aspirin  81 MG EC tablet [id number] No Take 81 mg by mouth daily. [provider] Taking Active Self  brexpiprazole  (REXULTI ) 1 MG TABS tablet [id number] No Take 1 mg by mouth daily. [provider] Taking Active Self  budesonide -formoterol  (SYMBICORT ) 160-4.5 MCG/ACT inhaler [id number] No Inhale 2 puffs into the lungs 2 (two) times daily. [name], [name], MD Taking Active   buPROPion  (WELLBUTRIN  XL) 300 MG 24 hr tablet [id number] No Take 300 mg by mouth daily. [provider] Taking Active Self  Cholecalciferol  (VITAMIN D3) 50 MCG (2000 UT) TABS [id number] No Take 2,000 Units by mouth daily. [provider] Taking Active Self  Cyanocobalamin  (VITAMIN B12) 1000 MCG TBCR [id number] No Take 1,000 Units by mouth daily. [provider] Taking Active Self  ferrous sulfate  325 (65 FE) MG tablet [id number] No Take 325 mg by mouth daily with breakfast. [provider] Taking Active Self  fluvoxaMINE   (LUVOX ) 50 MG tablet [id number] No Take 1.5 tablets (75 mg total) by mouth at bedtime. Oral Billings, MD Taking Active   gabapentin  (NEURONTIN ) 100 MG capsule [id number] No Take 100-300 mg by mouth 2 (two) times daily. Take one capsule by mouth in the morning. Take three capsules by mouth before bedtime [provider] Taking Active Self  levothyroxine  (SYNTHROID ) 100 MCG tablet [id number] No Take 100 mcg by mouth daily. [provider] Taking Active   Multiple Vitamins-Minerals (WOMENS 50+ MULTI VITAMIN/MIN) TABS [id number] No Take 1 tablet by mouth daily. [provider] Taking Active Self  omeprazole  (PRILOSEC) 40 MG capsule [id number] No TAKE 1 CAPSULE(40 MG) BY MOUTH DAILY AFTER BREAKFAST  Patient taking differently: Take 40 mg by mouth daily.   Ander Bame, MD Taking Active   rosuvastatin  (CRESTOR ) 5 MG tablet [id number] No TAKE 1 TABLET(5 MG) BY MOUTH DAILY  Patient taking differently: Take 5 mg by mouth daily.   [name], MD Taking Active   sodium chloride  1 g tablet [id number] No Take 1 g by mouth 2 (two) times daily. [provider] Taking Active   Med List Note [address] [facility] 10/08/22 1412): Calquence  filled at [facility]            Follow Up Plan:   Closing From:  Transitions of Care Program  [facility], BSN, RN [place]  [employer]BC[employer] - [facility] RN Care Manager [phone]

## 2024-01-28 DIAGNOSIS — I10 Essential (primary) hypertension: Secondary | ICD-10-CM | POA: Diagnosis not present

## 2024-01-28 DIAGNOSIS — F419 Anxiety disorder, unspecified: Secondary | ICD-10-CM | POA: Diagnosis not present

## 2024-01-28 DIAGNOSIS — D72829 Elevated white blood cell count, unspecified: Secondary | ICD-10-CM | POA: Diagnosis not present

## 2024-01-28 DIAGNOSIS — M549 Dorsalgia, unspecified: Secondary | ICD-10-CM | POA: Diagnosis not present

## 2024-01-28 DIAGNOSIS — K5790 Diverticulosis of intestine, part unspecified, without perforation or abscess without bleeding: Secondary | ICD-10-CM | POA: Diagnosis not present

## 2024-01-28 DIAGNOSIS — G629 Polyneuropathy, unspecified: Secondary | ICD-10-CM | POA: Diagnosis not present

## 2024-01-28 DIAGNOSIS — D630 Anemia in neoplastic disease: Secondary | ICD-10-CM | POA: Diagnosis not present

## 2024-01-28 DIAGNOSIS — E875 Hyperkalemia: Secondary | ICD-10-CM | POA: Diagnosis not present

## 2024-01-28 DIAGNOSIS — C9110 Chronic lymphocytic leukemia of B-cell type not having achieved remission: Secondary | ICD-10-CM | POA: Diagnosis not present

## 2024-01-28 DIAGNOSIS — I2510 Atherosclerotic heart disease of native coronary artery without angina pectoris: Secondary | ICD-10-CM | POA: Diagnosis not present

## 2024-01-28 DIAGNOSIS — M797 Fibromyalgia: Secondary | ICD-10-CM | POA: Diagnosis not present

## 2024-01-28 DIAGNOSIS — E7800 Pure hypercholesterolemia, unspecified: Secondary | ICD-10-CM | POA: Diagnosis not present

## 2024-01-28 DIAGNOSIS — M519 Unspecified thoracic, thoracolumbar and lumbosacral intervertebral disc disorder: Secondary | ICD-10-CM | POA: Diagnosis not present

## 2024-01-28 DIAGNOSIS — G894 Chronic pain syndrome: Secondary | ICD-10-CM | POA: Diagnosis not present

## 2024-01-28 DIAGNOSIS — J479 Bronchiectasis, uncomplicated: Secondary | ICD-10-CM | POA: Diagnosis not present

## 2024-01-28 DIAGNOSIS — I951 Orthostatic hypotension: Secondary | ICD-10-CM | POA: Diagnosis not present

## 2024-01-28 DIAGNOSIS — K219 Gastro-esophageal reflux disease without esophagitis: Secondary | ICD-10-CM | POA: Diagnosis not present

## 2024-01-28 DIAGNOSIS — C50511 Malignant neoplasm of lower-outer quadrant of right female breast: Secondary | ICD-10-CM | POA: Diagnosis not present

## 2024-01-28 DIAGNOSIS — E871 Hypo-osmolality and hyponatremia: Secondary | ICD-10-CM | POA: Diagnosis not present

## 2024-01-28 DIAGNOSIS — E222 Syndrome of inappropriate secretion of antidiuretic hormone: Secondary | ICD-10-CM | POA: Diagnosis not present

## 2024-01-28 DIAGNOSIS — F32A Depression, unspecified: Secondary | ICD-10-CM | POA: Diagnosis not present

## 2024-01-28 DIAGNOSIS — I809 Phlebitis and thrombophlebitis of unspecified site: Secondary | ICD-10-CM | POA: Diagnosis not present

## 2024-01-28 DIAGNOSIS — E039 Hypothyroidism, unspecified: Secondary | ICD-10-CM | POA: Diagnosis not present

## 2024-01-28 DIAGNOSIS — M722 Plantar fascial fibromatosis: Secondary | ICD-10-CM | POA: Diagnosis not present

## 2024-01-28 DIAGNOSIS — N200 Calculus of kidney: Secondary | ICD-10-CM | POA: Diagnosis not present

## 2024-02-01 ENCOUNTER — Other Ambulatory Visit (HOSPITAL_COMMUNITY): Payer: Self-pay

## 2024-02-02 ENCOUNTER — Other Ambulatory Visit: Payer: Self-pay

## 2024-02-02 ENCOUNTER — Other Ambulatory Visit: Payer: Self-pay | Admitting: Hematology and Oncology

## 2024-02-02 MED ORDER — CALQUENCE 100 MG PO TABS
100.0000 mg | ORAL_TABLET | Freq: Two times a day (BID) | ORAL | 2 refills | Status: DC
  Filled 2024-02-02: qty 60, 30d supply, fill #0
  Filled 2024-03-02: qty 60, 30d supply, fill #1
  Filled 2024-03-25 – 2024-03-30 (×2): qty 60, 30d supply, fill #2

## 2024-02-02 NOTE — Progress Notes (Signed)
 Specialty Pharmacy Refill Coordination Note  [name] [name] is a 78 y.o. female contacted today regarding refills of specialty medication(s) Acalabrutinib  Maleate (Calquence )   Patient requested Delivery   Delivery date: 02/08/24   Verified address: [address] [place] [address]   Medication will be filled on 06.13.25.

## 2024-02-04 ENCOUNTER — Telehealth: Payer: Self-pay | Admitting: *Deleted

## 2024-02-04 ENCOUNTER — Other Ambulatory Visit: Payer: Self-pay | Admitting: Hematology and Oncology

## 2024-02-04 DIAGNOSIS — F32A Depression, unspecified: Secondary | ICD-10-CM | POA: Diagnosis not present

## 2024-02-04 DIAGNOSIS — I951 Orthostatic hypotension: Secondary | ICD-10-CM | POA: Diagnosis not present

## 2024-02-04 DIAGNOSIS — F419 Anxiety disorder, unspecified: Secondary | ICD-10-CM | POA: Diagnosis not present

## 2024-02-04 DIAGNOSIS — E871 Hypo-osmolality and hyponatremia: Secondary | ICD-10-CM | POA: Diagnosis not present

## 2024-02-04 DIAGNOSIS — K219 Gastro-esophageal reflux disease without esophagitis: Secondary | ICD-10-CM | POA: Diagnosis not present

## 2024-02-04 DIAGNOSIS — D630 Anemia in neoplastic disease: Secondary | ICD-10-CM | POA: Diagnosis not present

## 2024-02-04 DIAGNOSIS — E7800 Pure hypercholesterolemia, unspecified: Secondary | ICD-10-CM | POA: Diagnosis not present

## 2024-02-04 DIAGNOSIS — N200 Calculus of kidney: Secondary | ICD-10-CM | POA: Diagnosis not present

## 2024-02-04 DIAGNOSIS — G894 Chronic pain syndrome: Secondary | ICD-10-CM | POA: Diagnosis not present

## 2024-02-04 DIAGNOSIS — I809 Phlebitis and thrombophlebitis of unspecified site: Secondary | ICD-10-CM | POA: Diagnosis not present

## 2024-02-04 DIAGNOSIS — C50011 Malignant neoplasm of nipple and areola, right female breast: Secondary | ICD-10-CM

## 2024-02-04 DIAGNOSIS — M797 Fibromyalgia: Secondary | ICD-10-CM | POA: Diagnosis not present

## 2024-02-04 DIAGNOSIS — E039 Hypothyroidism, unspecified: Secondary | ICD-10-CM | POA: Diagnosis not present

## 2024-02-04 DIAGNOSIS — J479 Bronchiectasis, uncomplicated: Secondary | ICD-10-CM | POA: Diagnosis not present

## 2024-02-04 DIAGNOSIS — E875 Hyperkalemia: Secondary | ICD-10-CM | POA: Diagnosis not present

## 2024-02-04 DIAGNOSIS — G629 Polyneuropathy, unspecified: Secondary | ICD-10-CM | POA: Diagnosis not present

## 2024-02-04 DIAGNOSIS — I10 Essential (primary) hypertension: Secondary | ICD-10-CM | POA: Diagnosis not present

## 2024-02-04 DIAGNOSIS — E222 Syndrome of inappropriate secretion of antidiuretic hormone: Secondary | ICD-10-CM | POA: Diagnosis not present

## 2024-02-04 DIAGNOSIS — C50511 Malignant neoplasm of lower-outer quadrant of right female breast: Secondary | ICD-10-CM | POA: Diagnosis not present

## 2024-02-04 DIAGNOSIS — C9110 Chronic lymphocytic leukemia of B-cell type not having achieved remission: Secondary | ICD-10-CM | POA: Diagnosis not present

## 2024-02-04 DIAGNOSIS — M722 Plantar fascial fibromatosis: Secondary | ICD-10-CM | POA: Diagnosis not present

## 2024-02-04 DIAGNOSIS — K5790 Diverticulosis of intestine, part unspecified, without perforation or abscess without bleeding: Secondary | ICD-10-CM | POA: Diagnosis not present

## 2024-02-04 DIAGNOSIS — M549 Dorsalgia, unspecified: Secondary | ICD-10-CM | POA: Diagnosis not present

## 2024-02-04 DIAGNOSIS — M519 Unspecified thoracic, thoracolumbar and lumbosacral intervertebral disc disorder: Secondary | ICD-10-CM | POA: Diagnosis not present

## 2024-02-04 DIAGNOSIS — I2510 Atherosclerotic heart disease of native coronary artery without angina pectoris: Secondary | ICD-10-CM | POA: Diagnosis not present

## 2024-02-04 DIAGNOSIS — D72829 Elevated white blood cell count, unspecified: Secondary | ICD-10-CM | POA: Diagnosis not present

## 2024-02-04 NOTE — Progress Notes (Signed)
 Complex Care Management Note  Care Guide Note 02/04/2024 Name: ARMINE RIZZOLO MRN: [id number] DOB: 26-Jul-1946  TAMERA PINGLEY is a 78 y.o. year old female who sees Imelda Man, MD for primary care. I reached out to Lovena Rubinstein by phone today to offer complex care management services.  Ms. Roell was given information about Complex Care Management services today including:   The Complex Care Management services include support from the care team which includes your Nurse Care Manager, Clinical Social Worker, or Pharmacist.  The Complex Care Management team is here to help remove barriers to the health concerns and goals most important to you. Complex Care Management services are voluntary, and the patient may decline or stop services at any time by request to their care team member.   Complex Care Management Consent Status: Patient agreed to services and verbal consent obtained.   Follow up plan:  Telephone appointment with complex care management team member scheduled for:  02/08/2024  Encounter Outcome:  Patient Scheduled  Kandis Ormond, CMA   [facility], [facility] Guide Direct Dial: [phone]  Fax: [phone] Website: [email or web address]

## 2024-02-05 ENCOUNTER — Other Ambulatory Visit: Payer: Self-pay

## 2024-02-08 ENCOUNTER — Telehealth: Payer: Self-pay

## 2024-02-08 ENCOUNTER — Other Ambulatory Visit: Payer: Self-pay | Admitting: Cardiology

## 2024-02-09 ENCOUNTER — Other Ambulatory Visit: Payer: Self-pay | Admitting: Hematology and Oncology

## 2024-02-09 DIAGNOSIS — C9110 Chronic lymphocytic leukemia of B-cell type not having achieved remission: Secondary | ICD-10-CM

## 2024-02-10 ENCOUNTER — Inpatient Hospital Stay: Attending: Hematology and Oncology

## 2024-02-11 DIAGNOSIS — G629 Polyneuropathy, unspecified: Secondary | ICD-10-CM | POA: Diagnosis not present

## 2024-02-11 DIAGNOSIS — M549 Dorsalgia, unspecified: Secondary | ICD-10-CM | POA: Diagnosis not present

## 2024-02-11 DIAGNOSIS — E222 Syndrome of inappropriate secretion of antidiuretic hormone: Secondary | ICD-10-CM | POA: Diagnosis not present

## 2024-02-11 DIAGNOSIS — E7800 Pure hypercholesterolemia, unspecified: Secondary | ICD-10-CM | POA: Diagnosis not present

## 2024-02-11 DIAGNOSIS — K219 Gastro-esophageal reflux disease without esophagitis: Secondary | ICD-10-CM | POA: Diagnosis not present

## 2024-02-11 DIAGNOSIS — E875 Hyperkalemia: Secondary | ICD-10-CM | POA: Diagnosis not present

## 2024-02-11 DIAGNOSIS — M519 Unspecified thoracic, thoracolumbar and lumbosacral intervertebral disc disorder: Secondary | ICD-10-CM | POA: Diagnosis not present

## 2024-02-11 DIAGNOSIS — E871 Hypo-osmolality and hyponatremia: Secondary | ICD-10-CM | POA: Diagnosis not present

## 2024-02-11 DIAGNOSIS — N200 Calculus of kidney: Secondary | ICD-10-CM | POA: Diagnosis not present

## 2024-02-11 DIAGNOSIS — D72829 Elevated white blood cell count, unspecified: Secondary | ICD-10-CM | POA: Diagnosis not present

## 2024-02-11 DIAGNOSIS — I10 Essential (primary) hypertension: Secondary | ICD-10-CM | POA: Diagnosis not present

## 2024-02-11 DIAGNOSIS — I951 Orthostatic hypotension: Secondary | ICD-10-CM | POA: Diagnosis not present

## 2024-02-11 DIAGNOSIS — C9110 Chronic lymphocytic leukemia of B-cell type not having achieved remission: Secondary | ICD-10-CM | POA: Diagnosis not present

## 2024-02-11 DIAGNOSIS — M797 Fibromyalgia: Secondary | ICD-10-CM | POA: Diagnosis not present

## 2024-02-11 DIAGNOSIS — K5790 Diverticulosis of intestine, part unspecified, without perforation or abscess without bleeding: Secondary | ICD-10-CM | POA: Diagnosis not present

## 2024-02-11 DIAGNOSIS — G894 Chronic pain syndrome: Secondary | ICD-10-CM | POA: Diagnosis not present

## 2024-02-11 DIAGNOSIS — M722 Plantar fascial fibromatosis: Secondary | ICD-10-CM | POA: Diagnosis not present

## 2024-02-11 DIAGNOSIS — F32A Depression, unspecified: Secondary | ICD-10-CM | POA: Diagnosis not present

## 2024-02-11 DIAGNOSIS — E039 Hypothyroidism, unspecified: Secondary | ICD-10-CM | POA: Diagnosis not present

## 2024-02-11 DIAGNOSIS — C50511 Malignant neoplasm of lower-outer quadrant of right female breast: Secondary | ICD-10-CM | POA: Diagnosis not present

## 2024-02-11 DIAGNOSIS — I809 Phlebitis and thrombophlebitis of unspecified site: Secondary | ICD-10-CM | POA: Diagnosis not present

## 2024-02-11 DIAGNOSIS — J479 Bronchiectasis, uncomplicated: Secondary | ICD-10-CM | POA: Diagnosis not present

## 2024-02-11 DIAGNOSIS — I2510 Atherosclerotic heart disease of native coronary artery without angina pectoris: Secondary | ICD-10-CM | POA: Diagnosis not present

## 2024-02-11 DIAGNOSIS — F419 Anxiety disorder, unspecified: Secondary | ICD-10-CM | POA: Diagnosis not present

## 2024-02-11 DIAGNOSIS — D630 Anemia in neoplastic disease: Secondary | ICD-10-CM | POA: Diagnosis not present

## 2024-02-22 ENCOUNTER — Telehealth: Payer: Self-pay | Admitting: *Deleted

## 2024-02-22 NOTE — Progress Notes (Unsigned)
 Complex Care Management Care Guide Note  02/22/2024 Name: [name] [name] MRN: [id number] DOB: 04-08-1946  [name] [name] is a 78 y.o. year old female who is a primary care patient of Clarice Nottingham, MD and is actively engaged with the care management team. I reached out to Analicia K Beg by phone today to assist with re-scheduling  with the RN Case Manager.  Follow up plan: Unsuccessful telephone outreach attempt made. A HIPAA compliant phone message was left for the patient providing contact information and requesting a return call.  Thedford Franks, CMA [place]  [facility], [facility] Guide Direct Dial: [phone]  Fax: [phone] Website: [email or web address]

## 2024-02-23 NOTE — Progress Notes (Signed)
 Complex Care Management Care Guide Note  02/23/2024 Name: [name] [name] MRN: [id number] DOB: 08/24/1946  [name] [name] is a 78 y.o. year old female who is a primary care patient of Clarice Nottingham, MD and is actively engaged with the care management team. I reached out to Clarita MARLA Brighter by phone today to assist with re-scheduling  with the RN Case Manager.  Follow up plan: Unsuccessful telephone outreach attempt made. A HIPAA compliant phone message was left for the patient providing contact information and requesting a return call. No further outreach attempts will be made due to inability to maintain patient contact.   Thedford Franks, CMA [place]  [facility], [facility] Guide Direct Dial: [phone]  Fax: [phone] Website: [email or web address]

## 2024-02-29 ENCOUNTER — Other Ambulatory Visit (HOSPITAL_COMMUNITY): Payer: Self-pay

## 2024-03-02 ENCOUNTER — Other Ambulatory Visit: Payer: Self-pay | Admitting: Pharmacy Technician

## 2024-03-02 ENCOUNTER — Other Ambulatory Visit: Payer: Self-pay

## 2024-03-02 NOTE — Progress Notes (Signed)
 Specialty Pharmacy Refill Coordination Note  DYANA MAGNER is a 78 y.o. female contacted today regarding refills of specialty medication(s) Acalabrutinib  Maleate (Calquence )   Patient requested Delivery   Delivery date: 03/07/24   Verified address: [address]   [name] [address]   Medication will be filled on 03/04/24.

## 2024-03-04 ENCOUNTER — Other Ambulatory Visit: Payer: Self-pay

## 2024-03-25 ENCOUNTER — Other Ambulatory Visit: Payer: Self-pay

## 2024-03-30 ENCOUNTER — Other Ambulatory Visit: Payer: Self-pay

## 2024-03-30 NOTE — Progress Notes (Signed)
 Specialty Pharmacy Refill Coordination Note  [name] [name] is a 78 y.o. female contacted today regarding refills of specialty medication(s) Acalabrutinib  Maleate (Calquence )   Patient requested Delivery   Delivery date: 04/05/24   Verified address: [address]   [name] [address]   Medication will be filled on 08.11.25.

## 2024-03-31 DIAGNOSIS — F331 Major depressive disorder, recurrent, moderate: Secondary | ICD-10-CM | POA: Diagnosis not present

## 2024-04-04 ENCOUNTER — Other Ambulatory Visit: Payer: Self-pay

## 2024-04-07 ENCOUNTER — Inpatient Hospital Stay (HOSPITAL_BASED_OUTPATIENT_CLINIC_OR_DEPARTMENT_OTHER): Admitting: Hematology and Oncology

## 2024-04-07 ENCOUNTER — Inpatient Hospital Stay: Attending: Hematology and Oncology

## 2024-04-07 VITALS — BP 137/74 | HR 76 | Temp 97.6°F | Resp 17 | Ht 63.0 in | Wt 190.9 lb

## 2024-04-07 DIAGNOSIS — C50511 Malignant neoplasm of lower-outer quadrant of right female breast: Secondary | ICD-10-CM

## 2024-04-07 DIAGNOSIS — Z170 Estrogen receptor positive status [ER+]: Secondary | ICD-10-CM

## 2024-04-07 DIAGNOSIS — Z853 Personal history of malignant neoplasm of breast: Secondary | ICD-10-CM | POA: Diagnosis not present

## 2024-04-07 DIAGNOSIS — C9110 Chronic lymphocytic leukemia of B-cell type not having achieved remission: Secondary | ICD-10-CM | POA: Diagnosis not present

## 2024-04-07 LAB — CMP (CANCER CENTER ONLY)
ALT: 32 U/L (ref 0–44)
AST: 33 U/L (ref 15–41)
Albumin: 4.5 g/dL (ref 3.5–5.0)
Alkaline Phosphatase: 99 U/L (ref 38–126)
Anion gap: 7 (ref 5–15)
BUN: 20 mg/dL (ref 8–23)
CO2: 25 mmol/L (ref 22–32)
Calcium: 9.5 mg/dL (ref 8.9–10.3)
Chloride: 108 mmol/L (ref 98–111)
Creatinine: 1.01 mg/dL — ABNORMAL HIGH (ref 0.44–1.00)
GFR, Estimated: 57 mL/min — ABNORMAL LOW (ref >60)
Glucose, Bld: 98 mg/dL (ref 70–99)
Potassium: 4.5 mmol/L (ref 3.5–5.1)
Sodium: 140 mmol/L (ref 135–145)
Total Bilirubin: 0.3 mg/dL (ref 0.0–1.2)
Total Protein: 6.5 g/dL (ref 6.5–8.1)

## 2024-04-07 LAB — CBC WITH DIFFERENTIAL (CANCER CENTER ONLY)
Abs Immature Granulocytes: 0.06 K/uL (ref 0.00–0.07)
Basophils Absolute: 0.1 K/uL (ref 0.0–0.1)
Basophils Relative: 1 %
Eosinophils Absolute: 0.6 K/uL — ABNORMAL HIGH (ref 0.0–0.5)
Eosinophils Relative: 5 %
HCT: 36 % (ref 36.0–46.0)
Hemoglobin: 11.6 g/dL — ABNORMAL LOW (ref 12.0–15.0)
Immature Granulocytes: 1 %
Lymphocytes Relative: 35 %
Lymphs Abs: 4.1 K/uL — ABNORMAL HIGH (ref 0.7–4.0)
MCH: 31.8 pg (ref 26.0–34.0)
MCHC: 32.2 g/dL (ref 30.0–36.0)
MCV: 98.6 fL (ref 80.0–100.0)
Monocytes Absolute: 0.9 K/uL (ref 0.1–1.0)
Monocytes Relative: 8 %
Neutro Abs: 6.1 K/uL (ref 1.7–7.7)
Neutrophils Relative %: 50 %
Platelet Count: 211 K/uL (ref 150–400)
RBC: 3.65 MIL/uL — ABNORMAL LOW (ref 3.87–5.11)
RDW: 13.7 % (ref 11.5–15.5)
WBC Count: 11.8 K/uL — ABNORMAL HIGH (ref 4.0–10.5)
nRBC: 0 % (ref 0.0–0.2)

## 2024-04-07 LAB — LACTATE DEHYDROGENASE: LDH: 165 U/L (ref 98–192)

## 2024-04-07 NOTE — Progress Notes (Signed)
 [facility] [phone]   [phone]  PROGRESS NOTE  Patient Care Team: Clarice Nottingham, MD as PCP - General (Internal Medicine) Jeffrie Oneil BROCKS, MD as PCP - Cardiology (Cardiology) Teressa Toribio SQUIBB, MD (Inactive) as Attending Physician (Gastroenterology) Cleotilde Ronal RAMAN, MD as Consulting Physician (Gynecology) Belinda Cough, MD as Consulting Physician (General Surgery) Darden Planas, NP as Nurse Practitioner Fidel Rogue, MD as Consulting Physician (Orthopedic Surgery) Heide Ingle, MD as Consulting Physician (Orthopedic Surgery) Vonzell Rosina BIRCH, MD as Attending Physician (Radiology) Malcolm Prentice Senior, MD as Referring Physician (Orthopedic Surgery) Federico Norleen ONEIDA MADISON, MD as Consulting Physician (Hematology and Oncology)  Hematological/Oncological History # CLL Rai Stage 1  06/04/2021: last visit with Dr. Layla. Detailed history of his care history noted below.  09/18/2021: WBC 43.3, Hgb 13.0, MCV 87.1, Plt 275 09/23/2021: establish care with Dr. Federico. WBC 30.1, Hgb 12.3, MCV 87.8, Plt 241 03/11/2022: WBC 57.4, Hgb 11.2, MCV 88.3, Plt 181  04/29/2022: Started acalabrutinib 100 mg PO twice daily  Interval History:  [name] [name] 78 y.o. 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 The patient's last visit was on 01/14/2024. In the interim since the last visit, she has continued on Calquence therapy without difficulty  On exam today [name] [name] reports that she has been having a good summer overall and has been well in the interim since her last visit.  She reports that she is doing her best to try to stay hydrated.  She is taking the Calquence and reports that she is tolerating well with minor side effects.  She is having some loss of hair and is noticing it when she brushes or combs.  She reports that she was having some pain above her ear over the last few days and has  subsequently resolved and she hopes it was nothing.  She notes that she is not motivated to do much exercise and she is having some chronic back pain.  She reports that she has been eating well with a good appetite though her weight has been declining and has dropped to 190 pounds from 197 pounds in April.  Otherwise she has been well and has no questions concerns or complaints today.  A full 10 point ROS is otherwise negative.   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   DDD (degenerative disc disease)    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 ear    Depression    Diverticulosis    Elevated LFTs    Fibromyalgia 1978   GERD (gastroesophageal reflux disease)    Hematuria    History of syncope    episode 2008--  no recurrence since   HLD (hyperlipidemia)    Hypothyroidism    Kidney stones 2014   Plantar fasciitis    Ruptured disk    one in neck and two in back   S/P chemotherapy, time since greater than 12 weeks    Sinus tachycardia    mild resting   Skin cancer    nose   Stool incontinence    at times recently  SURGICAL HISTORY: Past Surgical History:  Procedure Laterality Date   ANTERIOR LATERAL LUMBAR FUSION 4 LEVELS Left 04/25/2016   Procedure: LEFT LUMBAR ONE-TWO, LUMBAR TWO-THREE, LUMBAR THREE-FOUR, LUMBAR FOUR-FIVE ANTERIOR LATERAL LUMBAR FUSION;  Surgeon: Victory Gunnels, MD;  Location: [facility];  Service: Neurosurgery;  Laterality: Left;   APPENDECTOMY  1997   APPLICATION OF ROBOTIC ASSISTANCE FOR SPINAL PROCEDURE N/A 04/06/2017   Procedure: APPLICATION OF ROBOTIC ASSISTANCE FOR SPINAL PROCEDURE;  Surgeon: Gunnels Victory, MD;  Location: [facility];  Service: Neurosurgery;  Laterality:  N/A;   BREAST BIOPSY Right 2014   BREAST LUMPECTOMY Right 1997   BREAST LUMPECTOMY WITH AXILLARY LYMPH NODE DISSECTION Right 1997   CATARACT EXTRACTION, BILATERAL  2019   Dr. Lelon   CYSTOSCOPY WITH RETROGRADE PYELOGRAM, URETEROSCOPY AND STENT PLACEMENT Bilateral 06/17/2013   Procedure: CYSTOSCOPY WITH RETROGRADE PYELOGRAM, URETEROSCOPY AND LEFT DOUBLE  J STENT PLACEMENT RIGHT URETERAL HOLMIIUM LASER AND DOUBLE J STENT ;  Surgeon: Thomasine Oiler, MD;  Location: [facility] [place];  Service: Urology;  Laterality: Bilateral;   HOLMIUM LASER APPLICATION Bilateral 06/17/2013   Procedure: HOLMIUM LASER APPLICATION;  Surgeon: Thomasine Oiler, MD;  Location: [facility];  Service: Urology;  Laterality: Bilateral;   LAMINECTOMY WITH POSTERIOR LATERAL ARTHRODESIS LEVEL 3 N/A 02/02/2023   Procedure: Thoracic six - Thoracic nine Posterior lateral fusion;  Surgeon: Gunnels Victory, MD;  Location: [facility];  Service: Neurosurgery;  Laterality: N/A;   LITHOTRIPSY Left 06/2013   LUMBAR EPIDURAL INJECTION     has had 7 injections   PORT-A-CATH REMOVAL Left 10/03/2014   Procedure: REMOVAL PORT-A-CATH;  Surgeon: Donnice Lima, MD;  Location: [facility];  Service: General;  Laterality: Left;   PORTACATH PLACEMENT Left 07/28/2013   Procedure: ATTEMPTED INSERTION PORT-A-CATH;  Surgeon: Donnice POUR. Lima, MD;  Location: [facility];  Service: General;  Laterality: Left;   POSTERIOR LUMBAR FUSION 4 LEVEL N/A 04/25/2016   Procedure: LUMBAR FIVE-SACRAL ONE POSTERIOR LUMBAR INTERBODY FUSION, THORACIC NINE-SACRAL ONE POSTERIOR LATERAL ARTHRODESIS WITH PEDICLE SCREWS;  Surgeon: Victory Gunnels, MD;  Location: [facility];  Service: Neurosurgery;  Laterality: N/A;   RETINAL DETACHMENT SURGERY Right 2013   ROBOTIC ASSITED PARTIAL NEPHRECTOMY Right 02/05/2015   Procedure: ROBOTIC ASSITED PARTIAL NEPHRECTOMY;  Surgeon: Gretel Ferrara, MD;  Location: [facility];  Service: Urology;  Laterality: Right;   SIMPLE  MASTECTOMY WITH AXILLARY SENTINEL NODE BIOPSY Right 07/28/2013   Procedure: RIGHT TOTAL  MASTECTOMY;  Surgeon: Donnice POUR. Lima, MD;  Location: [facility] OR;  Service: General;  Laterality: Right;   TONSILLECTOMY  AGE 20   TOTAL ABDOMINAL HYSTERECTOMY W/ BILATERAL SALPINGOOPHORECTOMY  1997    SOCIAL HISTORY: Social History   Socioeconomic History   Marital status: Married    Spouse name: Philip   Number of children: 1   Years of education: Not on file   Highest education level: Not on file  Occupational History   Occupation: homemaker  Tobacco Use   Smoking status: Never   Smokeless tobacco: Never  Vaping Use   Vaping status: Never Used  Substance and Sexual Activity   Alcohol use: No   Drug use: No   Sexual activity: Not Currently    Partners: Male    Birth control/protection: Surgical    Comment: TAH/BSO  Other Topics Concern   Not on file  Social History Narrative   Not on file   Social Drivers of Health   Financial Resource Strain: Not on file  Food Insecurity: No Food  Insecurity (12/26/2023)   Hunger Vital Sign    Worried About Running Out of Food in the Last Year: Never true    Ran Out of Food in the Last Year: Never true  Transportation Needs: No Transportation Needs (12/26/2023)   PRAPARE - Administrator, Civil Service (Medical): No    Lack of Transportation (Non-Medical): No  Physical Activity: Not on file  Stress: Not on file  Social Connections: Moderately Integrated (12/28/2023)   Social Connection and Isolation Panel    Frequency of Communication with Friends and Family: Twice a week    Frequency of Social Gatherings with Friends and Family: Twice a week    Attends Religious Services: 1 to 4 times per year    Active Member of [employer] or Organizations: No    Attends Banker Meetings: Never    Marital Status: Married  Catering manager Violence: Not At Risk (12/26/2023)   Humiliation, Afraid, Rape, and Kick questionnaire    Fear of Current or  Ex-Partner: No    Emotionally Abused: No    Physically Abused: No    Sexually Abused: No    FAMILY HISTORY: Family History  Problem Relation Age of Onset   Heart disease Maternal Grandfather    Diabetes Maternal Grandfather    Colon cancer Maternal Aunt 96   Brain cancer Paternal Grandmother        dx in 79s   Dementia Mother    Diabetes Mother    Osteoporosis Mother    Diabetes Maternal Aunt    Prostate cancer Father 63   Bipolar disorder Maternal Aunt    Stroke Maternal Aunt    Stomach cancer Paternal Uncle        dx in late 62s    ALLERGIES:  is allergic to furosemide, sulfa antibiotics, trazodone and nefazodone, and lyrica [pregabalin].  MEDICATIONS:  Current Outpatient Medications  Medication Sig Dispense Refill   acalabrutinib maleate (CALQUENCE) 100 MG tablet Take 1 tablet (100 mg total) by mouth 2 (two) times daily. 60 tablet 2   allopurinol (ZYLOPRIM) 300 MG tablet TAKE 1 TABLET(300 MG) BY MOUTH DAILY (Patient taking differently: Take 300 mg by mouth daily.) 90 tablet 4   amitriptyline (ELAVIL) 25 MG tablet Take 2 tablets (50 mg total) by mouth at bedtime. 60 tablet 0   aspirin 81 MG EC tablet Take 81 mg by mouth daily.     brexpiprazole (REXULTI) 1 MG TABS tablet Take 1 mg by mouth daily.     budesonide-formoterol (SYMBICORT) 160-4.5 MCG/ACT inhaler Inhale 2 puffs into the lungs 2 (two) times daily. 3 each 3   buPROPion (WELLBUTRIN XL) 300 MG 24 hr tablet Take 300 mg by mouth daily.     Cholecalciferol (VITAMIN D3) 50 MCG (2000 UT) TABS Take 2,000 Units by mouth daily.     Cyanocobalamin (VITAMIN B12) 1000 MCG TBCR Take 1,000 Units by mouth daily.     ferrous sulfate 325 (65 FE) MG tablet Take 325 mg by mouth daily with breakfast.     fluvoxaMINE (LUVOX) 50 MG tablet Take 1.5 tablets (75 mg total) by mouth at bedtime. 45 tablet 0   gabapentin (NEURONTIN) 100 MG capsule Take 100-300 mg by mouth 2 (two) times daily. Take one capsule by mouth in the morning. Take three  capsules by mouth before bedtime     levothyroxine (SYNTHROID) 100 MCG tablet Take 100 mcg by mouth daily.     Multiple Vitamins-Minerals (WOMENS 50+ MULTI VITAMIN/MIN) TABS Take 1  tablet by mouth daily.     omeprazole  (PRILOSEC) 40 MG capsule TAKE 1 CAPSULE(40 MG) BY MOUTH DAILY AFTER BREAKFAST 90 capsule 0   rosuvastatin  (CRESTOR ) 5 MG tablet TAKE 1 TABLET(5 MG) BY MOUTH DAILY 90 tablet 3   sodium chloride  1 g tablet Take 1 g by mouth 2 (two) times daily.     No current facility-administered medications for this visit.    REVIEW OF SYSTEMS:   Constitutional: ( - ) fevers, ( - )  chills , ( - ) night sweats Eyes: ( - ) blurriness of vision, ( - ) double vision, ( - ) watery eyes Ears, nose, mouth, throat, and face: ( - ) mucositis, ( - ) sore throat Respiratory: ( - ) cough, ( - ) dyspnea, ( - ) wheezes Cardiovascular: ( - ) palpitation, ( - ) chest discomfort, ( - ) lower extremity swelling Gastrointestinal:  ( - ) nausea, ( - ) heartburn, ( - ) change in bowel habits Skin: ( - ) abnormal skin rashes Lymphatics: ( - ) new lymphadenopathy, ( - ) easy bruising Neurological: ( - ) numbness, ( - ) tingling, ( - ) new weaknesses Behavioral/Psych: ( - ) mood change, ( - ) new changes  All other systems were reviewed with the patient and are negative.  PHYSICAL EXAMINATION:  Vitals:   04/07/24 1522  BP: 137/74  Pulse: 76  Resp: 17  Temp: 97.6 F (36.4 C)  SpO2: 96%     Filed Weights   04/07/24 1522  Weight: 190 lb 14.4 oz (86.6 kg)      GENERAL: well appearing elderly Caucasian female. alert, no distress and comfortable SKIN:  skin color, texture, turgor are normal, no rashes or significant lesions EYES: conjunctiva are pink and non-injected, sclera clear LUNGS: clear to auscultation and percussion with normal breathing effort HEART: regular rate & rhythm and no murmurs and no lower extremity edema Musculoskeletal: no cyanosis of digits and no clubbing  PSYCH: alert &  oriented x 3, fluent speech NEURO: no focal motor/sensory deficits  LABORATORY DATA:  I have reviewed the data as listed    Latest Ref Rng & Units 04/07/2024    3:12 PM 01/14/2024    3:03 PM 12/29/2023    4:54 AM  CBC  WBC 4.0 - 10.5 K/uL 11.8  11.5  9.0   Hemoglobin 12.0 - 15.0 g/dL 88.3  89.5  89.3   Hematocrit 36.0 - 46.0 % 36.0  31.9  32.5   Platelets 150 - 400 K/uL 211  195  202        Latest Ref Rng & Units 04/07/2024    3:12 PM 01/14/2024    3:03 PM 12/29/2023    4:54 AM  CMP  Glucose 70 - 99 mg/dL 98  84  98   BUN 8 - 23 mg/dL 20  18  21    Creatinine 0.44 - 1.00 mg/dL 8.98  8.88  8.89   Sodium 135 - 145 mmol/L 140  140  135   Potassium 3.5 - 5.1 mmol/L 4.5  4.6  5.1   Chloride 98 - 111 mmol/L 108  108  107   CO2 22 - 32 mmol/L 25  23  21    Calcium  8.9 - 10.3 mg/dL 9.5  9.4  9.4   Total Protein 6.5 - 8.1 g/dL 6.5  6.4  5.5   Total Bilirubin 0.0 - 1.2 mg/dL 0.3  0.4  0.4   Alkaline Phos 38 - 126  U/L 99  95  73   AST 15 - 41 U/L 33  33  34   ALT 0 - 44 U/L 32  41  42     RADIOGRAPHIC STUDIES: No results found.   ASSESSMENT & PLAN NYANA HAREN is a 78 y.o. 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  History Adapted from Dr. Tauna last note:   (1) status post right breast lumpectomy and axillary lymph node dissection in 1997 for a stage I breast cancer, treated with adjuvant radiation and tamoxifen for 5 years   (2) status post right breast lower outer quadrant biopsy 07/13/2013 for a clinical T1c N0, stage IA invasive ductal carcinoma, grade 2, estrogen receptor 100% positive, progesterone receptor 13% positive, with an MIB-1 of 33%, and HER-2 amplification by CISH with a HER2/CEP 17 ratio of 3.19, and an average HER-2 copy number per cell of 4.15   (3) status post right mastectomy 07/28/2013 for a pT1c pN0, stage IA invasive ductal carcinoma, grade 3, with close but negative margins.  Prognostic panel was not repeated             (a) the patient met with Dr. Leora and has decided against reconstruction   (4) completed weekly paclitaxel x12 12/06/2913, with trastuzumab/ pertuzumab every 3 weeks; pertuzumab was held with third dose on 11/01/2013 due to diarrhea, tried a half dose on cycle 4 again with diarrhea developing   (5) trastuzumab (started 09/20/2013) continued for 1 year, last dose 09/21/2014;             (a) final echocardiogram 09/07/2014 showed an ejection fraction of 55-60%   (6) anastrozole started May 2015; completed April 2020             (a) bone density 12/15/2013 normal             (b) bone density 02/01/2016 at [place] was normal with a T score of -1.0     OTHER PROBLEMS: (a) History of chronic lymphoid leukemia diagnosed by flow cytometry 06/29/2013, the cells being CD5, CD20 and CD23 positive, CD10 negative.              (1) right renal mass resected on 02/05/15, consisting of atypical lymphoid proliferation composed of monotonous small lymphocytes              (2) Anemia with a normal MCV and normal ferritin-- B-12 and folate normal; stable             (3)  ibrutinib 280 milligrams daily started 03/26/2019, discontinued 06/03/2019 because of concerns regarding dosing             (4) rituximab weekly x8 started 06/15/2019 completed December 2020             (5) maintenance rituximab (Q 2 months) started 10/25/2019, continued through 08/03/2020  (6) 04/29/2022: Started acalabrutinib 100 mg PO twice daily   # CLL Rai Stage I --Due to rapid doubling time of white blood cell count from 28.3 on 10/07/2021 to 66.7 on 04/09/2022, recommendation was to initiate therapy with acalabrutinib 100 mg twice daily, started on 04/29/2022 --Last imaging of the abdomen in 2022 showed no evidence of splenomegaly Plan:  --Labs today show WBC 11.8, hemoglobin 11.6, MCV 98.6, platelets 211.  Creatinine and LFTs are normal.  --Continue on acalabrutinib at 100 mg twice  daily. --RTC in 12 weeks for clinic visit with interval 1  month lab visit.  #Head throbbing-resolved.  --Patient reports throbbing sensation without headaches. Likely secondary to acalabrutinib. After temporarily holding medication, symptoms have improved.  --Advised that headaches respond well to caffeine and trying to drink a caffeinated beverage. --Monitor for now and advised to follow up if symptoms worsen.  # ER + Breast Cancer in Survivorship -- Continue yearly mammograms. --last mammogram on 06/09/2022, next due in October 2024.  --Currently in survivorship.  No orders of the defined types were placed in this encounter.  All questions were answered. The patient knows to call the clinic with any problems, questions or concerns.  I have spent a total of 30 minutes minutes of face-to-face and non-face-to-face time, preparing to see the patient, performing a medically appropriate examination, counseling and educating the patient, ordering tests/procedures, documenting clinical information in the electronic health record, and care coordination.   Norleen IVAR Kidney, MD Department of Hematology/Oncology [facility] at [facility] Phone: [phone] Pager: [phone] Email: [email or web address]  04/10/2024 5:27 PM

## 2024-04-13 DIAGNOSIS — M5414 Radiculopathy, thoracic region: Secondary | ICD-10-CM | POA: Diagnosis not present

## 2024-04-13 DIAGNOSIS — Z6832 Body mass index (BMI) 32.0-32.9, adult: Secondary | ICD-10-CM | POA: Diagnosis not present

## 2024-04-20 ENCOUNTER — Ambulatory Visit: Admitting: Pulmonary Disease

## 2024-04-20 ENCOUNTER — Encounter: Payer: Self-pay | Admitting: Pulmonary Disease

## 2024-04-20 VITALS — BP 124/62 | HR 82 | Temp 98.6°F | Ht 63.0 in | Wt 190.0 lb

## 2024-04-20 DIAGNOSIS — K219 Gastro-esophageal reflux disease without esophagitis: Secondary | ICD-10-CM | POA: Diagnosis not present

## 2024-04-20 DIAGNOSIS — J45909 Unspecified asthma, uncomplicated: Secondary | ICD-10-CM

## 2024-04-20 DIAGNOSIS — R059 Cough, unspecified: Secondary | ICD-10-CM | POA: Diagnosis not present

## 2024-04-20 DIAGNOSIS — Z8616 Personal history of COVID-19: Secondary | ICD-10-CM

## 2024-04-20 DIAGNOSIS — J479 Bronchiectasis, uncomplicated: Secondary | ICD-10-CM | POA: Diagnosis not present

## 2024-04-20 DIAGNOSIS — J4540 Moderate persistent asthma, uncomplicated: Secondary | ICD-10-CM

## 2024-04-20 LAB — PULMONARY FUNCTION TEST
DL/VA % pred: 90 %
DL/VA: 3.74 ml/min/mmHg/L
DLCO cor % pred: 85 %
DLCO cor: 15.7 ml/min/mmHg
DLCO unc % pred: 80 %
DLCO unc: 14.76 ml/min/mmHg
FEF 25-75 Post: 4.14 L/s
FEF 25-75 Pre: 3.29 L/s
FEF2575-%Change-Post: 25 %
FEF2575-%Pred-Post: 285 %
FEF2575-%Pred-Pre: 226 %
FEV1-%Change-Post: 10 %
FEV1-%Pred-Post: 124 %
FEV1-%Pred-Pre: 113 %
FEV1-Post: 2.38 L
FEV1-Pre: 2.17 L
FEV1FVC-%Change-Post: 0 %
FEV1FVC-%Pred-Pre: 119 %
FEV6-%Change-Post: 10 %
FEV6-%Pred-Post: 110 %
FEV6-%Pred-Pre: 99 %
FEV6-Post: 2.7 L
FEV6-Pre: 2.43 L
FEV6FVC-%Pred-Post: 105 %
FEV6FVC-%Pred-Pre: 105 %
FVC-%Change-Post: 10 %
FVC-%Pred-Post: 104 %
FVC-%Pred-Pre: 94 %
FVC-Post: 2.7 L
FVC-Pre: 2.43 L
Post FEV1/FVC ratio: 88 %
Post FEV6/FVC ratio: 100 %
Pre FEV1/FVC ratio: 89 %
Pre FEV6/FVC Ratio: 100 %
RV % pred: 86 %
RV: 1.99 L
TLC % pred: 97 %
TLC: 4.77 L

## 2024-04-20 NOTE — Progress Notes (Signed)
 [name] [name]    [id number]    May 28, 1946  Primary Care Physician:Pharr, Ryan, MD  Referring Physician: Marykate Heuberger, MD [address] [place],  [place] [address]  Chief complaint: Follow up for post COVID-81  HPI: 78 year old with history of CLL, GERD Developed COVID-19 at the end of January 2022.  Treated with monoclonal antibody Had symptoms of cough and dyspnea which have been gradually getting better and back to baseline She had a high-resolution CT with no interstitial lung disease and normal PFTs  Pets: Cat Occupation: Homemaker Exposures: No known exposures.  No mold, hot tub, Jacuzzi Smoking history: Never smoker Travel history: No significant travel history Relevant family history: No family issue of lung disease.  Interim history: Discussed the use of AI scribe software for clinical note transcription with the patient, who gave verbal consent to proceed.  The patient presents with a persistent cough that is not improving. She describes it as a continuous cycle of coughing spells, sometimes followed by sneezing. She has tried home remedies, such as Leopold, without success. The patient has a history of bronchiectasis, which was identified on a recent CT scan. She also has a history of CLL and had COVID-19 in January. The patient reports that this type of cough seems to run in her family, with both her mother and grandmother having similar symptoms. She also has acid reflux, which is being treated with omeprazole , and she believes it is under good control. The patient has a history of multiple health issues, including cancer (five times), fibromyalgia, three back surgeries, and degenerative disc disease.    Outpatient Encounter Medications as of 04/20/2024  Medication Sig   acalabrutinib  maleate (CALQUENCE ) 100 MG tablet Take 1 tablet (100 mg total) by mouth 2 (two) times daily.   allopurinol  (ZYLOPRIM ) 300 MG tablet TAKE 1 TABLET(300 MG) BY MOUTH  DAILY   aspirin  81 MG EC tablet Take 81 mg by mouth daily.   brexpiprazole  (REXULTI ) 1 MG TABS tablet Take 1 mg by mouth daily. (Patient taking differently: Take 1 mg by mouth daily. Taking 2mg )   budesonide -formoterol  (SYMBICORT ) 160-4.5 MCG/ACT inhaler Inhale 2 puffs into the lungs 2 (two) times daily.   buPROPion  (WELLBUTRIN  XL) 300 MG 24 hr tablet Take 300 mg by mouth daily.   Cholecalciferol  (VITAMIN D3) 50 MCG (2000 UT) TABS Take 2,000 Units by mouth daily.   Cyanocobalamin  (VITAMIN B12) 1000 MCG TBCR Take 1,000 Units by mouth daily.   ferrous sulfate  325 (65 FE) MG tablet Take 325 mg by mouth daily with breakfast.   fluvoxaMINE  (LUVOX ) 50 MG tablet Take 1.5 tablets (75 mg total) by mouth at bedtime.   gabapentin  (NEURONTIN ) 100 MG capsule Take 100-300 mg by mouth 2 (two) times daily. Take one capsule by mouth in the morning. Take three capsules by mouth before bedtime   levothyroxine  (SYNTHROID ) 100 MCG tablet Take 100 mcg by mouth daily.   Multiple Vitamins-Minerals (WOMENS 50+ MULTI VITAMIN/MIN) TABS Take 1 tablet by mouth daily.   omeprazole  (PRILOSEC) 40 MG capsule TAKE 1 CAPSULE(40 MG) BY MOUTH DAILY AFTER BREAKFAST   rosuvastatin  (CRESTOR ) 5 MG tablet TAKE 1 TABLET(5 MG) BY MOUTH DAILY   sodium chloride  1 g tablet Take 1 g by mouth 2 (two) times daily.   amitriptyline  (ELAVIL ) 25 MG tablet Take 2 tablets (50 mg total) by mouth at bedtime. (Patient not taking: Reported on 04/20/2024)   No facility-administered encounter medications on file as  of 04/20/2024.    Physical Exam: Blood pressure (!) 110/58, pulse 98, last menstrual period 08/26/1995, SpO2 98%. Gen:      No acute distress HEENT:  EOMI, sclera anicteric Neck:     No masses; no thyromegaly Lungs:    Clear to auscultation bilaterally; normal respiratory effort CV:         Regular rate and rhythm; no murmurs Abd:      + bowel sounds; soft, non-tender; no palpable masses, no distension Ext:    No edema; adequate peripheral  perfusion Skin:      Warm and dry; no rash Neuro: alert and oriented x 3 Psych: normal mood and affect   Data Reviewed: Imaging: Chest x-ray 10/10/2020-faint hazy upper lung opacities.  High-resolution CT 12/31/2020-mild tubular bronchiectasis, mild air trapping, axillary and subpectoral lymph node.  No interstitial lung disease  High resolution CT 11/8/202- bronchiectasis, air trapping, minimal patchy groundglass I have reviewed the images personally.  PFTs: 03/18/2021 FVC 2.88 (102%), FEV1 2.57 [122%], F/F 90, TLC 4.34 [105%], DLCO 20.13 [105%] Normal test  Labs: CBC 05/08/2023-WBC 14.6, eos 3%, absolute eosinophil count 438 IgE 11/08/2013-379  Assessment:  Chronic Cough Persistent despite home remedies. CT scan showed mild bronchiectasis. Labs showed elevated eosinophils and IgE, suggesting possible allergic asthma. No postnasal drip or sinus congestion. Acid reflux is being treated with omeprazole . -Start Symbicort  inhaler twice daily. -Schedule lung function test in 6 months.  Gastroesophageal Reflux Disease (GERD) Currently on omeprazole . No new symptoms reported. -Continue omeprazole  as prescribed.  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  She does have minimal groundglass opacities and bronchiectasis.  PFTs in the past are normal Suspect dyspnea on exertion secondary to deconditioning and body habitus Advised weight loss with diet and exercise  Follow-up in 6 months for lung function test and to assess response to Symbicort .   Plan/Recommendations: Start Symbicort , PFTs and follow-up in 6 months  [name] Coder MD [facility] 04/20/2024, 3:05 PM  CC: [name] Skaff, MD

## 2024-04-20 NOTE — Patient Instructions (Signed)
  VISIT SUMMARY: You had a follow-up appointment to discuss your respiratory symptoms, including your asthma and mild bronchiectasis, as well as your gastroesophageal reflux disease (GERD). We reviewed your current medications and their effectiveness in managing your conditions.  YOUR PLAN: -ASTHMA AND MILD BRONCHIECTASIS: Asthma and mild bronchiectasis involve inflammation and widening of the airways. Your condition is mild, and your lung function tests are normal. The elevated eosinophils and IgE levels suggest an allergic component. Your cough has improved with the use of Symbicort , which indicates a positive response. Please continue using the Symbicort  inhaler as prescribed.  -GASTROESOPHAGEAL REFLUX DISEASE (GERD): GERD is a condition where stomach acid frequently flows back into the tube connecting your mouth and stomach, which can worsen cough and breathing issues. You are currently taking omeprazole  to manage these symptoms. Please continue taking omeprazole  as prescribed.

## 2024-04-20 NOTE — Patient Instructions (Signed)
Full PFT performed today, 

## 2024-04-20 NOTE — Progress Notes (Signed)
 Full PFT performed today.

## 2024-04-29 ENCOUNTER — Other Ambulatory Visit: Payer: Self-pay | Admitting: Hematology and Oncology

## 2024-04-29 ENCOUNTER — Other Ambulatory Visit: Payer: Self-pay

## 2024-04-29 MED ORDER — CALQUENCE 100 MG PO TABS
100.0000 mg | ORAL_TABLET | Freq: Two times a day (BID) | ORAL | 2 refills | Status: DC
  Filled 2024-04-29 – 2024-05-03 (×2): qty 60, 30d supply, fill #0
  Filled 2024-06-06: qty 60, 30d supply, fill #1
  Filled 2024-06-30: qty 60, 30d supply, fill #2

## 2024-05-03 ENCOUNTER — Other Ambulatory Visit: Payer: Self-pay

## 2024-05-03 NOTE — Progress Notes (Signed)
 Specialty Pharmacy Refill Coordination Note  [name] [name] is a 78 y.o. female contacted today regarding refills of specialty medication(s) Acalabrutinib  Maleate (Calquence )   Patient requested Delivery   Delivery date: 05/06/24   Verified address: [address]   [place] [address]   Medication will be filled on 09.11.25.

## 2024-05-04 ENCOUNTER — Other Ambulatory Visit: Payer: Self-pay

## 2024-05-09 ENCOUNTER — Other Ambulatory Visit: Payer: Self-pay | Admitting: Physician Assistant

## 2024-05-09 DIAGNOSIS — C50011 Malignant neoplasm of nipple and areola, right female breast: Secondary | ICD-10-CM

## 2024-05-09 DIAGNOSIS — C9110 Chronic lymphocytic leukemia of B-cell type not having achieved remission: Secondary | ICD-10-CM

## 2024-05-18 ENCOUNTER — Other Ambulatory Visit: Payer: Self-pay

## 2024-05-20 ENCOUNTER — Other Ambulatory Visit: Payer: Self-pay

## 2024-05-20 NOTE — Progress Notes (Signed)
 Specialty Pharmacy Ongoing Clinical Assessment Note  [name] [name] is a 78 y.o. female who is being followed by the specialty pharmacy service for RxSp Oncology   Patient's specialty medication(s) reviewed today: Acalabrutinib  Maleate (Calquence )   Missed doses in the last 4 weeks: 0   Patient/Caregiver did not have any additional questions or concerns.   Therapeutic benefit summary: Patient is achieving benefit   Adverse events/side effects summary: No adverse events/side effects   Patient's therapy is appropriate to: Continue    Goals Addressed             This Visit's Progress    Achieve or maintain remission   On track    Patient is on track. Patient will maintain adherence         Follow up: 6 months  [facility] Specialty Pharmacist

## 2024-06-02 ENCOUNTER — Other Ambulatory Visit: Payer: Self-pay

## 2024-06-06 ENCOUNTER — Other Ambulatory Visit: Payer: Self-pay

## 2024-06-06 NOTE — Progress Notes (Signed)
 Specialty Pharmacy Refill Coordination Note  [name] [name] is a 78 y.o. female contacted today regarding refills of specialty medication(s) Acalabrutinib  Maleate (Calquence )   Patient requested Delivery   Delivery date: 06/08/24   Verified address: [address]   [name] [address]   Medication will be filled on 06/07/24.

## 2024-06-15 DIAGNOSIS — F3341 Major depressive disorder, recurrent, in partial remission: Secondary | ICD-10-CM | POA: Diagnosis not present

## 2024-06-21 ENCOUNTER — Other Ambulatory Visit (HOSPITAL_COMMUNITY): Payer: Self-pay

## 2024-06-21 ENCOUNTER — Telehealth: Payer: Self-pay

## 2024-06-21 NOTE — Telephone Encounter (Signed)
 Oral Oncology Patient Advocate Encounter  Was successful in securing patient a $8000 grant from [facility] to provide copayment coverage for Calquence .  This will keep the out of pocket expense at $0.     [employer] ID: [id number]   The billing information is as follows and has been shared with [facility].    RxBin: [id number] PCN: [id number] Member ID: [id number] Group ID: [id number] Dates of Eligibility: 05/22/24 through 05/21/25  Fund:  CLL  Lucie Lamer, CPhT [place]  [facility] Specialty Pharmacy Services Oncology Pharmacy Patient Advocate Specialist II THERESSA [place] Phone: [phone]  Fax: [phone] [email or web address]

## 2024-06-22 DIAGNOSIS — I2584 Coronary atherosclerosis due to calcified coronary lesion: Secondary | ICD-10-CM | POA: Diagnosis not present

## 2024-06-22 DIAGNOSIS — E039 Hypothyroidism, unspecified: Secondary | ICD-10-CM | POA: Diagnosis not present

## 2024-06-29 DIAGNOSIS — R7303 Prediabetes: Secondary | ICD-10-CM | POA: Diagnosis not present

## 2024-06-29 DIAGNOSIS — C9110 Chronic lymphocytic leukemia of B-cell type not having achieved remission: Secondary | ICD-10-CM | POA: Diagnosis not present

## 2024-06-29 DIAGNOSIS — I2510 Atherosclerotic heart disease of native coronary artery without angina pectoris: Secondary | ICD-10-CM | POA: Diagnosis not present

## 2024-06-29 DIAGNOSIS — M8589 Other specified disorders of bone density and structure, multiple sites: Secondary | ICD-10-CM | POA: Diagnosis not present

## 2024-06-29 DIAGNOSIS — N1831 Chronic kidney disease, stage 3a: Secondary | ICD-10-CM | POA: Diagnosis not present

## 2024-06-29 DIAGNOSIS — Z0000 Encounter for general adult medical examination without abnormal findings: Secondary | ICD-10-CM | POA: Diagnosis not present

## 2024-06-29 DIAGNOSIS — Z23 Encounter for immunization: Secondary | ICD-10-CM | POA: Diagnosis not present

## 2024-06-30 ENCOUNTER — Other Ambulatory Visit: Payer: Self-pay

## 2024-07-04 ENCOUNTER — Other Ambulatory Visit: Payer: Self-pay

## 2024-07-04 ENCOUNTER — Other Ambulatory Visit: Payer: Self-pay | Admitting: Pharmacy Technician

## 2024-07-04 NOTE — Progress Notes (Signed)
 Specialty Pharmacy Refill Coordination Note  [name] [name] is a 78 y.o. female contacted today regarding refills of specialty medication(s) Acalabrutinib  Maleate (Calquence )   Patient requested Delivery   Delivery date: 07/13/24   Verified address: [address]  [place]   Medication will be filled on: 07/12/24

## 2024-07-07 ENCOUNTER — Inpatient Hospital Stay: Attending: Hematology and Oncology

## 2024-07-07 ENCOUNTER — Inpatient Hospital Stay: Admitting: Hematology and Oncology

## 2024-07-07 ENCOUNTER — Encounter (HOSPITAL_BASED_OUTPATIENT_CLINIC_OR_DEPARTMENT_OTHER): Payer: Self-pay

## 2024-07-07 DIAGNOSIS — Z1231 Encounter for screening mammogram for malignant neoplasm of breast: Secondary | ICD-10-CM | POA: Diagnosis not present

## 2024-07-07 NOTE — Progress Notes (Deleted)
 [facility] [phone]   [phone]  PROGRESS NOTE  Patient Care Team: Clarice Nottingham, MD as PCP - General (Internal Medicine) Jeffrie Oneil BROCKS, MD as PCP - Cardiology (Cardiology) Teressa Toribio SQUIBB, MD (Inactive) as Attending Physician (Gastroenterology) Cleotilde Ronal RAMAN, MD as Consulting Physician (Gynecology) Belinda Cough, MD as Consulting Physician (General Surgery) Darden Planas, NP as Nurse Practitioner Fidel Rogue, MD as Consulting Physician (Orthopedic Surgery) Heide Ingle, MD as Consulting Physician (Orthopedic Surgery) Vonzell Rosina BIRCH, MD as Attending Physician (Radiology) Malcolm Prentice Senior, MD as Referring Physician (Orthopedic Surgery) Federico Norleen ONEIDA MADISON, MD as Consulting Physician (Hematology and Oncology)  Hematological/Oncological History # CLL Rai Stage 1  06/04/2021: last visit with Dr. Layla. Detailed history of his care history noted below.  09/18/2021: WBC 43.3, Hgb 13.0, MCV 87.1, Plt 275 09/23/2021: establish care with Dr. Federico. WBC 30.1, Hgb 12.3, MCV 87.8, Plt 241 03/11/2022: WBC 57.4, Hgb 11.2, MCV 88.3, Plt 181  04/29/2022: Started acalabrutinib  100 mg PO twice daily  Interval History:  [name] [name] 78 y.o. 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 The patient's last visit was on 01/14/2024. In the interim since the last visit, she has continued on Calquence  therapy without difficulty  On exam today [name] [name] reports that she has been having a good summer overall and has been well in the interim since her last visit.  She reports that she is doing her best to try to stay hydrated.  She is taking the Calquence  and reports that she is tolerating well with minor side effects.  She is having some loss of hair and is noticing it when she brushes or combs.  She reports that she was having some pain above her ear over the last few days and has  subsequently resolved and she hopes it was nothing.  She notes that she is not motivated to do much exercise and she is having some chronic back pain.  She reports that she has been eating well with a good appetite though her weight has been declining and has dropped to 190 pounds from 197 pounds in April.  Otherwise she has been well and has no questions concerns or complaints today.  A full 10 point ROS is otherwise negative.   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   DDD (degenerative disc disease)    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 ear    Depression    Diverticulosis    Elevated LFTs    Fibromyalgia 1978   GERD (gastroesophageal reflux disease)    Hematuria    History of syncope    episode 2008--  no recurrence since   HLD (hyperlipidemia)    Hypothyroidism    Kidney stones 2014   Plantar fasciitis    Ruptured disk    one in neck and two in back   S/P chemotherapy, time since greater than 12 weeks    Sinus tachycardia    mild resting   Skin cancer    nose   Stool incontinence    at times recently  SURGICAL HISTORY: Past Surgical History:  Procedure Laterality Date   ANTERIOR LATERAL LUMBAR FUSION 4 LEVELS Left 04/25/2016   Procedure: LEFT LUMBAR ONE-TWO, LUMBAR TWO-THREE, LUMBAR THREE-FOUR, LUMBAR FOUR-FIVE ANTERIOR LATERAL LUMBAR FUSION;  Surgeon: Victory Gunnels, MD;  Location: [facility];  Service: Neurosurgery;  Laterality: Left;   APPENDECTOMY  1997   APPLICATION OF ROBOTIC ASSISTANCE FOR SPINAL PROCEDURE N/A 04/06/2017   Procedure: APPLICATION OF ROBOTIC ASSISTANCE FOR SPINAL PROCEDURE;  Surgeon: Gunnels Victory, MD;  Location: [facility] OR;  Service: Neurosurgery;  Laterality:  N/A;   BREAST BIOPSY Right 2014   BREAST LUMPECTOMY Right 1997   BREAST LUMPECTOMY WITH AXILLARY LYMPH NODE DISSECTION Right 1997   CATARACT EXTRACTION, BILATERAL  2019   Dr. Lelon   CYSTOSCOPY WITH RETROGRADE PYELOGRAM, URETEROSCOPY AND STENT PLACEMENT Bilateral 06/17/2013   Procedure: CYSTOSCOPY WITH RETROGRADE PYELOGRAM, URETEROSCOPY AND LEFT DOUBLE  J STENT PLACEMENT RIGHT URETERAL HOLMIIUM LASER AND DOUBLE J STENT ;  Surgeon: Thomasine Oiler, MD;  Location: [facility];  Service: Urology;  Laterality: Bilateral;   HOLMIUM LASER APPLICATION Bilateral 06/17/2013   Procedure: HOLMIUM LASER APPLICATION;  Surgeon: Thomasine Oiler, MD;  Location[facility];  Service: Urology;  Laterality: Bilateral;   LAMINECTOMY WITH POSTERIOR LATERAL ARTHRODESIS LEVEL 3 N/A 02/02/2023   Procedure: Thoracic six - Thoracic nine Posterior lateral fusion;  Surgeon: Gunnels Victory, MD;  Location: [facility] OR;  Service: Neurosurgery;  Laterality: N/A;   LITHOTRIPSY Left 06/2013   LUMBAR EPIDURAL INJECTION     has had 7 injections   PORT-A-CATH REMOVAL Left 10/03/2014   Procedure: REMOVAL PORT-A-CATH;  Surgeon: Donnice Lima, MD;  Location: [facility];  Service: General;  Laterality: Left;   PORTACATH PLACEMENT Left 07/28/2013   Procedure: ATTEMPTED INSERTION PORT-A-CATH;  Surgeon: Donnice POUR. Lima, MD;  Location: [facility];  Service: General;  Laterality: Left;   POSTERIOR LUMBAR FUSION 4 LEVEL N/A 04/25/2016   Procedure: LUMBAR FIVE-SACRAL ONE POSTERIOR LUMBAR INTERBODY FUSION, THORACIC NINE-SACRAL ONE POSTERIOR LATERAL ARTHRODESIS WITH PEDICLE SCREWS;  Surgeon: Victory Gunnels, MD;  Location: [facility];  Service: Neurosurgery;  Laterality: N/A;   RETINAL DETACHMENT SURGERY Right 2013   ROBOTIC ASSITED PARTIAL NEPHRECTOMY Right 02/05/2015   Procedure: ROBOTIC ASSITED PARTIAL NEPHRECTOMY;  Surgeon: Gretel Ferrara, MD;  Location: [facility];  Service: Urology;  Laterality: Right;   SIMPLE  MASTECTOMY WITH AXILLARY SENTINEL NODE BIOPSY Right 07/28/2013   Procedure: RIGHT TOTAL  MASTECTOMY;  Surgeon: Donnice POUR. Lima, MD;  Location: [facility];  Service: General;  Laterality: Right;   TONSILLECTOMY  AGE 36   TOTAL ABDOMINAL HYSTERECTOMY W/ BILATERAL SALPINGOOPHORECTOMY  1997    SOCIAL HISTORY: Social History   Socioeconomic History   Marital status: Married    Spouse name: Philip   Number of children: 1   Years of education: Not on file   Highest education level: Not on file  Occupational History   Occupation: homemaker  Tobacco Use   Smoking status: Never   Smokeless tobacco: Never  Vaping Use   Vaping status: Never Used  Substance and Sexual Activity   Alcohol use: No   Drug use: No   Sexual activity: Not Currently    Partners: Male    Birth control/protection: Surgical    Comment: TAH/BSO  Other Topics Concern   Not on file  Social History Narrative   Not on file   Social Drivers of Health   Financial Resource Strain: Not on file  Food Insecurity: No Food  Insecurity (12/26/2023)   Hunger Vital Sign    Worried About Running Out of Food in the Last Year: Never true    Ran Out of Food in the Last Year: Never true  Transportation Needs: No Transportation Needs (12/26/2023)   PRAPARE - Administrator, Civil Service (Medical): No    Lack of Transportation (Non-Medical): No  Physical Activity: Not on file  Stress: Not on file  Social Connections: Moderately Integrated (12/28/2023)   Social Connection and Isolation Panel    Frequency of Communication with Friends and Family: Twice a week    Frequency of Social Gatherings with Friends and Family: Twice a week    Attends Religious Services: 1 to 4 times per year    Active Member of [employer] or Organizations: No    Attends Banker Meetings: Never    Marital Status: Married  Catering Manager Violence: Not At Risk (12/26/2023)   Humiliation, Afraid, Rape, and Kick questionnaire    Fear of Current or  Ex-Partner: No    Emotionally Abused: No    Physically Abused: No    Sexually Abused: No    FAMILY HISTORY: Family History  Problem Relation Age of Onset   Heart disease Maternal Grandfather    Diabetes Maternal Grandfather    Colon cancer Maternal Aunt 80   Brain cancer Paternal Grandmother        dx in 68s   Dementia Mother    Diabetes Mother    Osteoporosis Mother    Diabetes Maternal Aunt    Prostate cancer Father 7   Bipolar disorder Maternal Aunt    Stroke Maternal Aunt    Stomach cancer Paternal Uncle        dx in late 58s    ALLERGIES:  is allergic to furosemide, sulfa antibiotics, trazodone and nefazodone, and lyrica [pregabalin].  MEDICATIONS:  Current Outpatient Medications  Medication Sig Dispense Refill   acalabrutinib  maleate (CALQUENCE ) 100 MG tablet Take 1 tablet (100 mg total) by mouth 2 (two) times daily. 60 tablet 2   allopurinol  (ZYLOPRIM ) 300 MG tablet TAKE 1 TABLET(300 MG) BY MOUTH DAILY 90 tablet 4   amitriptyline  (ELAVIL ) 25 MG tablet Take 2 tablets (50 mg total) by mouth at bedtime. (Patient not taking: Reported on 04/20/2024) 60 tablet 0   aspirin  81 MG EC tablet Take 81 mg by mouth daily.     brexpiprazole  (REXULTI ) 1 MG TABS tablet Take 1 mg by mouth daily. (Patient taking differently: Take 1 mg by mouth daily. Taking 2mg )     budesonide -formoterol  (SYMBICORT ) 160-4.5 MCG/ACT inhaler Inhale 2 puffs into the lungs 2 (two) times daily. 3 each 3   buPROPion  (WELLBUTRIN  XL) 300 MG 24 hr tablet Take 300 mg by mouth daily.     Cholecalciferol  (VITAMIN D3) 50 MCG (2000 UT) TABS Take 2,000 Units by mouth daily.     Cyanocobalamin  (VITAMIN B12) 1000 MCG TBCR Take 1,000 Units by mouth daily.     ferrous sulfate  325 (65 FE) MG tablet Take 325 mg by mouth daily with breakfast.     fluvoxaMINE  (LUVOX ) 50 MG tablet Take 1.5 tablets (75 mg total) by mouth at bedtime. 45 tablet 0   gabapentin  (NEURONTIN ) 100 MG capsule Take 100-300 mg by mouth 2 (two) times daily.  Take one capsule by mouth in the morning. Take three capsules by mouth before bedtime     levothyroxine  (SYNTHROID ) 100 MCG tablet Take 100 mcg by mouth daily.     mirtazapine (  REMERON) 15 MG tablet      Multiple Vitamins-Minerals (WOMENS 50+ MULTI VITAMIN/MIN) TABS Take 1 tablet by mouth daily.     omeprazole  (PRILOSEC) 40 MG capsule TAKE 1 CAPSULE(40 MG) BY MOUTH DAILY AFTER BREAKFAST 90 capsule 0   rosuvastatin  (CRESTOR ) 5 MG tablet TAKE 1 TABLET(5 MG) BY MOUTH DAILY 90 tablet 3   sodium chloride  1 g tablet Take 1 g by mouth 2 (two) times daily.     No current facility-administered medications for this visit.    REVIEW OF SYSTEMS:   Constitutional: ( - ) fevers, ( - )  chills , ( - ) night sweats Eyes: ( - ) blurriness of vision, ( - ) double vision, ( - ) watery eyes Ears, nose, mouth, throat, and face: ( - ) mucositis, ( - ) sore throat Respiratory: ( - ) cough, ( - ) dyspnea, ( - ) wheezes Cardiovascular: ( - ) palpitation, ( - ) chest discomfort, ( - ) lower extremity swelling Gastrointestinal:  ( - ) nausea, ( - ) heartburn, ( - ) change in bowel habits Skin: ( - ) abnormal skin rashes Lymphatics: ( - ) new lymphadenopathy, ( - ) easy bruising Neurological: ( - ) numbness, ( - ) tingling, ( - ) new weaknesses Behavioral/Psych: ( - ) mood change, ( - ) new changes  All other systems were reviewed with the patient and are negative.  PHYSICAL EXAMINATION:  There were no vitals filed for this visit.    There were no vitals filed for this visit.     GENERAL: well appearing elderly Caucasian female. alert, no distress and comfortable SKIN:  skin color, texture, turgor are normal, no rashes or significant lesions EYES: conjunctiva are pink and non-injected, sclera clear LUNGS: clear to auscultation and percussion with normal breathing effort HEART: regular rate & rhythm and no murmurs and no lower extremity edema Musculoskeletal: no cyanosis of digits and no clubbing  PSYCH:  alert & oriented x 3, fluent speech NEURO: no focal motor/sensory deficits  LABORATORY DATA:  I have reviewed the data as listed    Latest Ref Rng & Units 04/07/2024    3:12 PM 01/14/2024    3:03 PM 12/29/2023    4:54 AM  CBC  WBC 4.0 - 10.5 K/uL 11.8  11.5  9.0   Hemoglobin 12.0 - 15.0 g/dL 88.3  89.5  89.3   Hematocrit 36.0 - 46.0 % 36.0  31.9  32.5   Platelets 150 - 400 K/uL 211  195  202        Latest Ref Rng & Units 04/07/2024    3:12 PM 01/14/2024    3:03 PM 12/29/2023    4:54 AM  CMP  Glucose 70 - 99 mg/dL 98  84  98   BUN 8 - 23 mg/dL 20  18  21    Creatinine 0.44 - 1.00 mg/dL 8.98  8.88  8.89   Sodium 135 - 145 mmol/L 140  140  135   Potassium 3.5 - 5.1 mmol/L 4.5  4.6  5.1   Chloride 98 - 111 mmol/L 108  108  107   CO2 22 - 32 mmol/L 25  23  21    Calcium  8.9 - 10.3 mg/dL 9.5  9.4  9.4   Total Protein 6.5 - 8.1 g/dL 6.5  6.4  5.5   Total Bilirubin 0.0 - 1.2 mg/dL 0.3  0.4  0.4   Alkaline Phos 38 - 126 U/L 99  95  73   AST 15 - 41 U/L 33  33  34   ALT 0 - 44 U/L 32  41  42     RADIOGRAPHIC STUDIES: No results found.   ASSESSMENT & PLAN [name] [name] is a 78 y.o. 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  History Adapted from Dr. Tauna last note:   (1) status post right breast lumpectomy and axillary lymph node dissection in 1997 for a stage I breast cancer, treated with adjuvant radiation and tamoxifen for 5 years   (2) status post right breast lower outer quadrant biopsy 07/13/2013 for a clinical T1c N0, stage IA invasive ductal carcinoma, grade 2, estrogen receptor 100% positive, progesterone receptor 13% positive, with an MIB-1 of 33%, and HER-2 amplification by CISH with a HER2/CEP 17 ratio of 3.19, and an average HER-2 copy number per cell of 4.15   (3) status post right mastectomy 07/28/2013 for a pT1c pN0, stage IA invasive ductal carcinoma, grade 3, with close but negative margins.  Prognostic panel was not repeated             (a) the patient met with Dr. Leora and has decided against reconstruction   (4) completed weekly paclitaxel  x12 12/06/2913, with trastuzumab / pertuzumab  every 3 weeks; pertuzumab  was held with third dose on 11/01/2013 due to diarrhea, tried a half dose on cycle 4 again with diarrhea developing   (5) trastuzumab  (started 09/20/2013) continued for 1 year, last dose 09/21/2014;             (a) final echocardiogram 09/07/2014 showed an ejection fraction of 55-60%   (6) anastrozole  started May 2015; completed April 2020             (a) bone density 12/15/2013 normal             (b) bone density 02/01/2016 at [place] was normal with a T score of -1.0     OTHER PROBLEMS: (a) History of chronic lymphoid leukemia diagnosed by flow cytometry 06/29/2013, the cells being CD5, CD20 and CD23 positive, CD10 negative.              (1) right renal mass resected on 02/05/15, consisting of atypical lymphoid proliferation composed of monotonous small lymphocytes              (2) Anemia with a normal MCV and normal ferritin-- B-12 and folate normal; stable             (3)  ibrutinib  280 milligrams daily started 03/26/2019, discontinued 06/03/2019 because of concerns regarding dosing             (4) rituximab  weekly x8 started 06/15/2019 completed December 2020             (5) maintenance rituximab  (Q 2 months) started 10/25/2019, continued through 08/03/2020  (6) 04/29/2022: Started acalabrutinib  100 mg PO twice daily   # CLL Rai Stage I --Due to rapid doubling time of white blood cell count from 28.3 on 10/07/2021 to 66.7 on 04/09/2022, recommendation was to initiate therapy with acalabrutinib  100 mg twice daily, started on 04/29/2022 --Last imaging of the abdomen in 2022 showed no evidence of splenomegaly Plan:  --Labs today show WBC 11.8, hemoglobin 11.6, MCV 98.6, platelets 211.  Creatinine and LFTs are normal.  --Continue on acalabrutinib  at 100 mg twice  daily. --RTC in 12 weeks for clinic visit with interval 1 month lab visit.  #Head  throbbing-resolved.  --Patient reports throbbing sensation without headaches. Likely secondary to acalabrutinib . After temporarily holding medication, symptoms have improved.  --Advised that headaches respond well to caffeine and trying to drink a caffeinated beverage. --Monitor for now and advised to follow up if symptoms worsen.  # ER + Breast Cancer in Survivorship -- Continue yearly mammograms. --last mammogram on 06/09/2022, next due in October 2024.  --Currently in survivorship.  No orders of the defined types were placed in this encounter.  All questions were answered. The patient knows to call the clinic with any problems, questions or concerns.  I have spent a total of 30 minutes minutes of face-to-face and non-face-to-face time, preparing to see the patient, performing a medically appropriate examination, counseling and educating the patient, ordering tests/procedures, documenting clinical information in the electronic health record, and care coordination.   Norleen IVAR Kidney, MD Department of Hematology/Oncology [facility] Phone: [phone] Pager: [phone] Email: [email or web address]  07/07/2024 12:50 PM

## 2024-08-03 ENCOUNTER — Other Ambulatory Visit: Payer: Self-pay | Admitting: Hematology and Oncology

## 2024-08-03 ENCOUNTER — Other Ambulatory Visit: Payer: Self-pay

## 2024-08-03 ENCOUNTER — Telehealth (INDEPENDENT_AMBULATORY_CARE_PROVIDER_SITE_OTHER): Payer: Self-pay

## 2024-08-03 MED ORDER — CALQUENCE 100 MG PO TABS
100.0000 mg | ORAL_TABLET | Freq: Two times a day (BID) | ORAL | 2 refills | Status: AC
  Filled 2024-08-03 – 2024-08-09 (×2): qty 60, 30d supply, fill #0
  Filled 2024-09-02 – 2024-09-05 (×2): qty 60, 30d supply, fill #1

## 2024-08-03 NOTE — Telephone Encounter (Signed)
 Patient called stating that she scheduled an appointment for Dr. Tobie in January but what she is needing is her hearing aids fixed. She does not need the appointment with Dr. Tobie if he can not fix her hearing aids.

## 2024-08-09 ENCOUNTER — Other Ambulatory Visit: Payer: Self-pay

## 2024-08-09 NOTE — Progress Notes (Signed)
 Specialty Pharmacy Refill Coordination Note  [name] [name] is a 78 y.o. female contacted today regarding refills of specialty medication(s) Acalabrutinib  Maleate (Calquence )   Patient requested Delivery   Delivery date: 08/11/24   Verified address: [address]  [place]   Medication will be filled on: 08/10/24

## 2024-08-10 ENCOUNTER — Other Ambulatory Visit: Payer: Self-pay

## 2024-08-11 ENCOUNTER — Other Ambulatory Visit: Payer: Self-pay | Admitting: Hematology and Oncology

## 2024-08-11 DIAGNOSIS — C9110 Chronic lymphocytic leukemia of B-cell type not having achieved remission: Secondary | ICD-10-CM

## 2024-08-11 DIAGNOSIS — C50011 Malignant neoplasm of nipple and areola, right female breast: Secondary | ICD-10-CM

## 2024-08-23 ENCOUNTER — Other Ambulatory Visit: Payer: Self-pay

## 2024-08-29 ENCOUNTER — Other Ambulatory Visit: Payer: Self-pay | Admitting: Pulmonary Disease

## 2024-08-29 NOTE — Telephone Encounter (Signed)
 Copied from CRM [id number]. Topic: Clinical - Medication Refill >> Aug 29, 2024  3:49 PM Leila C wrote: Most Recent Pulmonary Care Visit:  Provider: Dr. [name] [name]  Department: [facility] Pulmonary Care Date: 04/20/24 Medication(s): budesonide -formoterol  (SYMBICORT ) 160-4.5 MCG/ACT inhaler   Has the patient contacted their pharmacy? No. Patient [phone] states needs budesonide -formoterol  (SYMBICORT ) 160-4.5 MCG/ACT inhaler refill, states does not have the number of the inhaler to call it in the pharmacy. Patient is out of medication.  (Agent: If no, request that the patient contact the pharmacy for the refill. If patient does not wish to contact the pharmacy document the reason why and proceed with request.) (Agent: If yes, when and what did the pharmacy advise?)  This is the patient's preferred pharmacy:  [id number] - [place], [place] - [address] SEC [address] [place] [address] Phone: [phone] Fax: [phone]  Is this the correct pharmacy for this prescription? Yes If no, delete pharmacy and type the correct one.   Has the prescription been filled recently? No  Is the patient out of the medication? Yes  Has the patient been seen for an appointment in the last year OR does the patient have an upcoming appointment? Yes  Can we respond through MyChart? No  Agent: Please be advised that Rx refills may take up to 3 business days. We ask that you follow-up with your pharmacy.

## 2024-08-30 MED ORDER — BUDESONIDE-FORMOTEROL FUMARATE 160-4.5 MCG/ACT IN AERO: 2.0000 | INHALATION_SPRAY | Freq: Two times a day (BID) | RESPIRATORY_TRACT | 3 refills | Status: AC

## 2024-09-02 ENCOUNTER — Other Ambulatory Visit: Payer: Self-pay

## 2024-09-05 ENCOUNTER — Other Ambulatory Visit: Payer: Self-pay

## 2024-09-07 ENCOUNTER — Other Ambulatory Visit (HOSPITAL_COMMUNITY): Payer: Self-pay

## 2024-09-07 NOTE — Progress Notes (Signed)
 Specialty Pharmacy Refill Coordination Note  [name] [name] is a 79 y.o. female contacted today regarding refills of specialty medication(s) Acalabrutinib  Maleate (Calquence )   Patient requested Delivery   Delivery date: 09/13/24   Verified address: [address]  [place]   Medication will be filled on: 09/12/24

## 2024-10-06 ENCOUNTER — Inpatient Hospital Stay: Attending: Hematology and Oncology

## 2024-10-06 ENCOUNTER — Inpatient Hospital Stay: Admitting: Hematology and Oncology

## 2025-01-05 ENCOUNTER — Inpatient Hospital Stay: Admitting: Hematology and Oncology

## 2025-01-05 ENCOUNTER — Inpatient Hospital Stay: Attending: Hematology and Oncology
